# Patient Record
Sex: Female | Born: 1940
Health system: Southern US, Community
[De-identification: ages and names within clinical notes are randomized; demographics above are authoritative.]

## PROBLEM LIST (undated history)

## (undated) DIAGNOSIS — N179 Acute kidney failure, unspecified: Secondary | ICD-10-CM

## (undated) DIAGNOSIS — G4762 Sleep related leg cramps: Secondary | ICD-10-CM

## (undated) DIAGNOSIS — M48061 Spinal stenosis, lumbar region without neurogenic claudication: Secondary | ICD-10-CM

## (undated) DIAGNOSIS — E1140 Type 2 diabetes mellitus with diabetic neuropathy, unspecified: Secondary | ICD-10-CM

## (undated) DIAGNOSIS — K5792 Diverticulitis of intestine, part unspecified, without perforation or abscess without bleeding: Secondary | ICD-10-CM

## (undated) DIAGNOSIS — M48062 Spinal stenosis, lumbar region with neurogenic claudication: Secondary | ICD-10-CM

## (undated) DIAGNOSIS — M810 Age-related osteoporosis without current pathological fracture: Secondary | ICD-10-CM

## (undated) DIAGNOSIS — Z9889 Other specified postprocedural states: Secondary | ICD-10-CM

## (undated) DIAGNOSIS — D62 Acute posthemorrhagic anemia: Secondary | ICD-10-CM

## (undated) DIAGNOSIS — D573 Sickle-cell trait: Secondary | ICD-10-CM

## (undated) DIAGNOSIS — M170 Bilateral primary osteoarthritis of knee: Secondary | ICD-10-CM

## (undated) DIAGNOSIS — J189 Pneumonia, unspecified organism: Secondary | ICD-10-CM

## (undated) DIAGNOSIS — G253 Myoclonus: Secondary | ICD-10-CM

## (undated) DIAGNOSIS — I10 Essential (primary) hypertension: Secondary | ICD-10-CM

## (undated) DIAGNOSIS — E785 Hyperlipidemia, unspecified: Secondary | ICD-10-CM

## (undated) DIAGNOSIS — R0600 Dyspnea, unspecified: Secondary | ICD-10-CM

## (undated) DIAGNOSIS — R195 Other fecal abnormalities: Secondary | ICD-10-CM

## (undated) DIAGNOSIS — R011 Cardiac murmur, unspecified: Secondary | ICD-10-CM

## (undated) DIAGNOSIS — R413 Other amnesia: Secondary | ICD-10-CM

## (undated) DIAGNOSIS — K219 Gastro-esophageal reflux disease without esophagitis: Secondary | ICD-10-CM

## (undated) DIAGNOSIS — D649 Anemia, unspecified: Secondary | ICD-10-CM

## (undated) DIAGNOSIS — E1165 Type 2 diabetes mellitus with hyperglycemia: Secondary | ICD-10-CM

## (undated) DIAGNOSIS — T8859XA Other complications of anesthesia, initial encounter: Secondary | ICD-10-CM

## (undated) DIAGNOSIS — M4316 Spondylolisthesis, lumbar region: Secondary | ICD-10-CM

## (undated) DIAGNOSIS — F419 Anxiety disorder, unspecified: Secondary | ICD-10-CM

## (undated) DIAGNOSIS — M1990 Unspecified osteoarthritis, unspecified site: Secondary | ICD-10-CM

## (undated) DIAGNOSIS — G709 Myoneural disorder, unspecified: Secondary | ICD-10-CM

## (undated) DIAGNOSIS — M5416 Radiculopathy, lumbar region: Secondary | ICD-10-CM

## (undated) DIAGNOSIS — G4730 Sleep apnea, unspecified: Secondary | ICD-10-CM

## (undated) DIAGNOSIS — G9340 Encephalopathy, unspecified: Secondary | ICD-10-CM

## (undated) DIAGNOSIS — E039 Hypothyroidism, unspecified: Secondary | ICD-10-CM

## (undated) DIAGNOSIS — T4145XA Adverse effect of unspecified anesthetic, initial encounter: Secondary | ICD-10-CM

## (undated) DIAGNOSIS — M5126 Other intervertebral disc displacement, lumbar region: Secondary | ICD-10-CM

## (undated) DIAGNOSIS — M797 Fibromyalgia: Secondary | ICD-10-CM

## (undated) HISTORY — DX: Spinal stenosis, lumbar region with neurogenic claudication: M48.062

## (undated) HISTORY — PX: OVARIAN CYST SURGERY: SHX726

## (undated) HISTORY — DX: Encephalopathy, unspecified: G93.40

## (undated) HISTORY — DX: Spondylolisthesis, lumbar region: M43.16

## (undated) HISTORY — PX: COLONOSCOPY: SHX174

## (undated) HISTORY — DX: Myoclonus: G25.3

## (undated) HISTORY — DX: Type 2 diabetes mellitus with diabetic neuropathy, unspecified: E11.40

## (undated) HISTORY — PX: EYE SURGERY: SHX253

## (undated) HISTORY — PX: ABDOMINAL HYSTERECTOMY: SHX81

## (undated) HISTORY — DX: Other amnesia: R41.3

## (undated) HISTORY — DX: Sickle-cell trait: D57.3

## (undated) HISTORY — DX: Radiculopathy, lumbar region: M54.16

## (undated) HISTORY — DX: Other intervertebral disc displacement, lumbar region: M51.26

## (undated) HISTORY — DX: Spinal stenosis, lumbar region without neurogenic claudication: M48.061

## (undated) HISTORY — DX: Type 2 diabetes mellitus with hyperglycemia: E11.65

## (undated) HISTORY — DX: Other specified postprocedural states: Z98.890

## (undated) HISTORY — DX: Bilateral primary osteoarthritis of knee: M17.0

## (undated) HISTORY — DX: Acute kidney failure, unspecified: N17.9

## (undated) HISTORY — PX: BACK SURGERY: SHX140

## (undated) HISTORY — DX: Sleep related leg cramps: G47.62

## (undated) HISTORY — DX: Acute posthemorrhagic anemia: D62

## (undated) HISTORY — DX: Age-related osteoporosis without current pathological fracture: M81.0

## (undated) HISTORY — DX: Hyperlipidemia, unspecified: E78.5

## (undated) HISTORY — PX: OTHER SURGICAL HISTORY: SHX169

---

## 1998-10-20 ENCOUNTER — Ambulatory Visit (HOSPITAL_COMMUNITY): Admission: RE | Admit: 1998-10-20 | Discharge: 1998-10-20 | Payer: Self-pay | Admitting: Family Medicine

## 2000-12-26 ENCOUNTER — Other Ambulatory Visit: Admission: RE | Admit: 2000-12-26 | Discharge: 2000-12-26 | Payer: Self-pay | Admitting: Family Medicine

## 2002-03-24 ENCOUNTER — Ambulatory Visit (HOSPITAL_COMMUNITY): Admission: RE | Admit: 2002-03-24 | Discharge: 2002-03-24 | Payer: Self-pay | Admitting: Gastroenterology

## 2002-03-24 ENCOUNTER — Encounter (INDEPENDENT_AMBULATORY_CARE_PROVIDER_SITE_OTHER): Payer: Self-pay | Admitting: *Deleted

## 2003-05-12 ENCOUNTER — Encounter: Payer: Self-pay | Admitting: Internal Medicine

## 2003-08-04 ENCOUNTER — Encounter: Admission: RE | Admit: 2003-08-04 | Discharge: 2003-08-04 | Payer: Self-pay | Admitting: Internal Medicine

## 2004-03-07 ENCOUNTER — Ambulatory Visit: Payer: Self-pay | Admitting: Family Medicine

## 2004-04-11 ENCOUNTER — Ambulatory Visit: Payer: Self-pay | Admitting: Internal Medicine

## 2004-05-04 ENCOUNTER — Ambulatory Visit: Payer: Self-pay | Admitting: Internal Medicine

## 2004-06-13 ENCOUNTER — Other Ambulatory Visit: Admission: RE | Admit: 2004-06-13 | Discharge: 2004-06-13 | Payer: Self-pay | Admitting: Family Medicine

## 2005-04-21 ENCOUNTER — Emergency Department (HOSPITAL_COMMUNITY): Admission: EM | Admit: 2005-04-21 | Discharge: 2005-04-21 | Payer: Self-pay | Admitting: Emergency Medicine

## 2005-06-06 ENCOUNTER — Ambulatory Visit: Payer: Self-pay | Admitting: Internal Medicine

## 2005-07-03 ENCOUNTER — Emergency Department (HOSPITAL_COMMUNITY): Admission: EM | Admit: 2005-07-03 | Discharge: 2005-07-03 | Payer: Self-pay | Admitting: Emergency Medicine

## 2005-11-20 ENCOUNTER — Encounter: Admission: RE | Admit: 2005-11-20 | Discharge: 2005-11-20 | Payer: Self-pay | Admitting: Family Medicine

## 2007-05-25 ENCOUNTER — Encounter: Admission: RE | Admit: 2007-05-25 | Discharge: 2007-05-25 | Payer: Self-pay | Admitting: Orthopedic Surgery

## 2007-06-04 ENCOUNTER — Encounter: Admission: RE | Admit: 2007-06-04 | Discharge: 2007-06-04 | Payer: Self-pay | Admitting: Family Medicine

## 2007-06-19 ENCOUNTER — Encounter: Admission: RE | Admit: 2007-06-19 | Discharge: 2007-06-19 | Payer: Self-pay | Admitting: Family Medicine

## 2007-06-19 ENCOUNTER — Other Ambulatory Visit: Admission: RE | Admit: 2007-06-19 | Discharge: 2007-06-19 | Payer: Self-pay | Admitting: Interventional Radiology

## 2007-06-19 ENCOUNTER — Encounter (INDEPENDENT_AMBULATORY_CARE_PROVIDER_SITE_OTHER): Payer: Self-pay | Admitting: Interventional Radiology

## 2007-08-24 ENCOUNTER — Encounter: Admission: RE | Admit: 2007-08-24 | Discharge: 2007-08-24 | Payer: Self-pay | Admitting: Family Medicine

## 2008-01-06 ENCOUNTER — Emergency Department (HOSPITAL_COMMUNITY): Admission: EM | Admit: 2008-01-06 | Discharge: 2008-01-06 | Payer: Self-pay | Admitting: Emergency Medicine

## 2008-03-28 ENCOUNTER — Encounter: Admission: RE | Admit: 2008-03-28 | Discharge: 2008-04-18 | Payer: Self-pay | Admitting: Rheumatology

## 2009-07-25 ENCOUNTER — Ambulatory Visit (HOSPITAL_BASED_OUTPATIENT_CLINIC_OR_DEPARTMENT_OTHER): Admission: RE | Admit: 2009-07-25 | Discharge: 2009-07-25 | Payer: Self-pay | Admitting: Cardiology

## 2009-07-29 ENCOUNTER — Ambulatory Visit: Payer: Self-pay | Admitting: Internal Medicine

## 2010-05-13 ENCOUNTER — Encounter: Payer: Self-pay | Admitting: Orthopedic Surgery

## 2010-09-07 NOTE — Op Note (Signed)
   NAME:  [name], [name]                        ACCOUNT NO.:  [id number]   MEDICAL RECORD NO.:  [id number]                   PATIENT TYPE:  AMB   LOCATION:  ENDO                                 FACILITY:  [facility]   PHYSICIAN:  Anselmo Rod, M.D.               DATE OF BIRTH:  1941/01/21   DATE OF PROCEDURE:  DATE OF DISCHARGE:  03/24/2002                                 OPERATIVE REPORT   PROCEDURE PERFORMED:  Screening colonoscopy.   ENDOSCOPIST:  Charna Elizabeth, M.D.   INSTRUMENT USED:  Olympus video colonoscope.   INDICATIONS FOR PROCEDURE:  The patient is a 70 year old undergoing  screening colonoscopy.  Rule out colonic polyps, masses, etc.   PREPROCEDURE PREPARATION:  Informed consent was procured from the patient.  The patient was fasted for eight hours prior to the procedure and prepped  with a bottle of magnesium citrate and a gallon of NuLytely the night prior  to the procedure.   PREPROCEDURE PHYSICAL:  The patient had stable vital signs. Neck supple.  Chest clear to auscultation.  S1 and S2 regular.  Abdomen soft with normal  bowel sounds.   DESCRIPTION OF PROCEDURE:  The patient was placed in left lateral decubitus  position and sedated with 50 mg of Demerol and 5 mg of Versed intravenously.  Once the patient was adequately sedated and maintained on low flow oxygen  and continuous cardiac monitoring, the Olympus video colonoscope was  advanced from the rectum to the cecum without difficulty.  There was  evidence of left-sided diverticulosis. Small internal hemorrhoids were seen  on retroflexion.  Three small polyps were biopsied from 15 cm.  The patient  tolerated the procedure well without complications.  No large masses or  polyps were seen.   IMPRESSION:  1. Three small sessile polyps biopsied  at 15 cm.  2. Left-sided diverticulosis.  3. Normal-appearing transverse colon, right colon and cecum.   RECOMMENDATIONS:  1. Await pathology results.  2.     High  fiber diet with liberal fluid intake.  3. Repeat colorectal cancer screening in the next three to five years     depending on the pathology results.  4. Outpatient follow-up in the next two weeks.                                                   Anselmo Rod, M.D.    JNM/MEDQ  D:  03/25/2002  T:  03/26/2002  Job:  [id number]   cc:   [name], M.D.  [phone] [address]  [place]  [place] [address]  Fax: [phone]

## 2010-11-17 ENCOUNTER — Emergency Department (HOSPITAL_COMMUNITY): Payer: Medicare Other

## 2010-11-17 ENCOUNTER — Inpatient Hospital Stay (HOSPITAL_COMMUNITY)
Admission: EM | Admit: 2010-11-17 | Discharge: 2010-11-22 | DRG: 552 | Disposition: A | Discharge status: Home / Self Care | Payer: Medicare Other | Attending: Internal Medicine | Admitting: Internal Medicine

## 2010-11-17 DIAGNOSIS — E119 Type 2 diabetes mellitus without complications: Secondary | ICD-10-CM | POA: Diagnosis present

## 2010-11-17 DIAGNOSIS — I10 Essential (primary) hypertension: Secondary | ICD-10-CM | POA: Diagnosis present

## 2010-11-17 DIAGNOSIS — M545 Low back pain, unspecified: Secondary | ICD-10-CM

## 2010-11-17 DIAGNOSIS — E785 Hyperlipidemia, unspecified: Secondary | ICD-10-CM | POA: Diagnosis present

## 2010-11-17 DIAGNOSIS — M25559 Pain in unspecified hip: Secondary | ICD-10-CM | POA: Diagnosis present

## 2010-11-17 DIAGNOSIS — R197 Diarrhea, unspecified: Secondary | ICD-10-CM | POA: Diagnosis present

## 2010-11-17 DIAGNOSIS — M4310 Spondylolisthesis, site unspecified: Principal | ICD-10-CM | POA: Diagnosis present

## 2010-11-17 DIAGNOSIS — IMO0001 Reserved for inherently not codable concepts without codable children: Secondary | ICD-10-CM | POA: Diagnosis present

## 2010-11-17 DIAGNOSIS — M79609 Pain in unspecified limb: Secondary | ICD-10-CM | POA: Diagnosis present

## 2010-11-17 DIAGNOSIS — T383X5A Adverse effect of insulin and oral hypoglycemic [antidiabetic] drugs, initial encounter: Secondary | ICD-10-CM | POA: Diagnosis present

## 2010-11-18 ENCOUNTER — Observation Stay (HOSPITAL_COMMUNITY): Payer: Medicare Other

## 2010-11-18 LAB — COMPREHENSIVE METABOLIC PANEL
AST: 16 U/L (ref 0–37)
Albumin: 3.7 g/dL (ref 3.5–5.2)
Alkaline Phosphatase: 44 U/L (ref 39–117)
BUN: 18 mg/dL (ref 6–23)
Potassium: 3.8 mEq/L (ref 3.5–5.1)
Sodium: 142 mEq/L (ref 135–145)
Total Protein: 6.5 g/dL (ref 6.0–8.3)

## 2010-11-18 LAB — CK: Total CK: 70 U/L (ref 7–177)

## 2010-11-18 LAB — CBC
MCH: 27.5 pg (ref 26.0–34.0)
MCHC: 33.5 g/dL (ref 30.0–36.0)
RBC: 4.43 MIL/uL (ref 3.87–5.11)
RDW: 13.4 % (ref 11.5–15.5)
WBC: 7.1 10*3/uL (ref 4.0–10.5)

## 2010-11-18 LAB — DIFFERENTIAL
Eosinophils Relative: 3 % (ref 0–5)
Lymphocytes Relative: 35 % (ref 12–46)
Lymphs Abs: 2.5 10*3/uL (ref 0.7–4.0)
Monocytes Absolute: 0.7 10*3/uL (ref 0.1–1.0)
Neutro Abs: 3.7 10*3/uL (ref 1.7–7.7)

## 2010-11-18 LAB — GLUCOSE, CAPILLARY
Glucose-Capillary: 154 mg/dL — ABNORMAL HIGH (ref 70–99)
Glucose-Capillary: 173 mg/dL — ABNORMAL HIGH (ref 70–99)
Glucose-Capillary: 237 mg/dL — ABNORMAL HIGH (ref 70–99)

## 2010-11-18 NOTE — H&P (Signed)
[name], [name]              ACCOUNT NO.:  [id number]  MEDICAL RECORD NO.:  [id number]  LOCATION:  [facility]                         FACILITY:  [facility]  PHYSICIAN:  [name], MD        DATE OF BIRTH:  26-May-1940  DATE OF ADMISSION:  11/17/2010 DATE OF DISCHARGE:                             HISTORY & PHYSICAL PCP is Dr. [name].  CHIEF COMPLAINT:  Severe lower extremity pain and hip pain.  HISTORY OF PRESENT ILLNESS:  This is a pleasant 70 year old African American female who presents to [facility] Emergency Room for abdominal pain because she could not get up this morning.  She states yesterday this started and she had trouble with her legs and could not get up.  She called her daughter and went to Dr. [name] office and was seen by the PA there and diagnosed hip bursitis and had bilateral hip injections with prednisone/steroids and was then sent home with instructions not to sit in one place.  The patient ignored this advice and did on and off computer work, seated but taking breaks and this morning woke up after taking half hydrocodone last night and could not get up again.  Called her daughter and was brought over to the emergency room by her daughter for further evaluation.  She was told by the PA that she may require MRI of the hip and had an x-ray of the lumbar spine in that office prior.  The patient also states that she has had chronic diarrhea for about 8 months and unsure why she has this.  She is worried that she has had MS as her mother had the same thing and she really could not think because of this.  She states that she has been more careful than usual.  REVIEW OF SYSTEMS:  The patient states that she has pain in her back going down thighs, behind her knee, it stops at the back of the knee, it is sort of an electric shock like sensation down her back down to her knees.  She also has severe lancinating pain.  She denies any dysuria, any chest pain,  shortness breath, any stroke.  Stated that she has lost eyesight recently and diagnosed with wet macular degeneration.  At baseline, the patient can walk 3-4 blocks, but has noticed in the recent past over the past 2-3 months that when she walks she gets tired and does develop discomfort in her back.  This has been going on maybe for about 2-6 months.  PAST MEDICAL HISTORY:  She is diabetic.  She has hypertension, cholesterol.  Has been diagnosed with fibromyalgia and sees Dr. [name] for the same.  The patient retired in December.  MEDICATIONS: 1. Glumetza 1000 mg. 2. Zetia 10 mg. 3. Glimepiride 1 tab daily. 4. Lescol 80 mg daily. 5. Diovan 320 mg daily. 6. Zolpidem 10 mg daily. 7. Potassium chloride 20 mEq daily. 8. Onglyza 5 mg daily. 9. Alendronate 70 mg daily. 10.Hydrocodone/APAP 7.5/500 daily. 11.Ganciclovir 500 daily. 12.Gabapentin 100 mg 3 times at night. 13.Calcium 600 daily. Numerous Eye drops  ALLERGIES:  She is allergic to Dristan Sinus.  The patient denies any loss of control  bowel or bladder or saddle-like anesthesia.  PHYSICAL EXAMINATION:  VITAL SIGNS:  Temperature 98.9, blood pressure 103/69, respirations 19, pain is noted as severe, O2 sat 97%. GENERAL:  The patient is alert, oriented, African American female in significant distress. HEENT:  When she sits up, I cannot appreciate fundi.  She has good dentition.  She has no pallor.  She has no icterus. NECK:  She has no JVD.  She has no carotid bruits. HEART:  S1 and S2.  No murmurs, rubs, or gallops.  I do not appreciate any murmurs, rubs, or gallops. ABDOMEN:  Soft, nontender, and nondistended. EXTREMITIES:  Straight leg raising test cause some discomfort on the left side.  Axial rotation of the hip while lying in bed and standing up caused significant discomfort and pain, causes severe lancinating pain down both hips.  Reflexes are 2 and 3 bilaterally.  Ankle reflexes 2 and 3 bilaterally.   Brachioradialis, biceps, and triceps are 2/3.  Power is preserved in lower extremities and weaker than upper extremities.  Her dorsiflexion is weaker than the plantarflexion.  LABORATORY DATA:  Hip x-ray on November 17, 2010, negative exam for fracture or dislocation.  No focal deficits.  No acute findings on the right side.  Mild degenerative joint disease identified.  Lumbar spinal x-ray showed L5-S1 degenerative disk disease, first-degree anterolisthesis at L5-S1, no acute findings identified.  IMPRESSION/RECOMMENDATIONS: 1. Unlikely neurogenic claudication secondary to possible spinal     stenosis.  On axial rotation, she has pretty severe pain with     lancinating symptoms down the back.  We will get an MRI of the back     as well as the hips to rule out any spinal stenosis versus nerve     impingements.  I have explained to the patient that there may be 2     modalities to her pain.  She may have some trochanteric bursitis as     well as she has had hip shots in the recent past.  However, we will     see how her pain is controlled over the course of her time here     and if we can, we will either have Neurosurgery or Orthopedic     consult to determine the best course of action versus past     interventional period.  Certainly, she would benefit from minimal     invasive epidural surgery by interventional radiologist if     possible. 2. Severe pain.  We will place her on IV Dilaudid and also keep her on     diazepam while here. 3. Hypertension.  She has borderline low blood pressures.  I will     reduce her Diovan to 160 mg and review her blood pressure. 4. Diabetes mellitus.  I will hold her metformin as well as other oral     anti-glycemics and keep her on sliding scale insulin with no long-     term coverage.  She seems active and her Dm is well controlled and might not require much. 5. The patient will be admitted as an outpatient until we get an MRI     and then we will  determine ultimate course of treatment. 6. Diarrhoea-she has been having this for about 8 months and I doubt this is infectious. She thinks this started aroudn the time that she was started back on Metformin--I will hold her Metformin and see if this resolves. 7. ? of MS-he mother has this-patient is very concerend  re: likely having this.  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   I spent over 30 minutes admitting this patient.          ______________________________ [name], MD     JS/MEDQ  D:  11/17/2010  T:  11/17/2010  Job:  [id number]  Electronically Signed by [name] MD on 11/18/2010 12:39:48 PM

## 2010-11-19 ENCOUNTER — Observation Stay (HOSPITAL_COMMUNITY): Payer: Medicare Other

## 2010-11-19 LAB — GLUCOSE, CAPILLARY: Glucose-Capillary: 135 mg/dL — ABNORMAL HIGH (ref 70–99)

## 2010-11-20 LAB — GLUCOSE, CAPILLARY
Glucose-Capillary: 210 mg/dL — ABNORMAL HIGH (ref 70–99)
Glucose-Capillary: 129 mg/dL — ABNORMAL HIGH (ref 70–99)
Glucose-Capillary: 153 mg/dL — ABNORMAL HIGH (ref 70–99)

## 2010-11-20 LAB — APTT: aPTT: 26 seconds (ref 24–37)

## 2010-11-20 NOTE — Discharge Summary (Signed)
[name], [name]              ACCOUNT NO.:  [id number]  MEDICAL RECORD NO.:  [id number]  LOCATION:  3013                         FACILITY:  [facility]  PHYSICIAN:  [name], MD        DATE OF BIRTH:  Oct 03, 1940  DATE OF ADMISSION:  11/17/2010 DATE OF DISCHARGE:                              DISCHARGE SUMMARY   DISCHARGE DIAGNOSES: 1. Severe bilateral arthritis of L5-S1 and bilateral lateral recess     stenosis impinging of S1 nerve root resulting in difficulty     ambulating. 2. Hypertension. 3. Diabetes mellitus. 4. Hyperlipidemia. 5. Diarrhea likely secondary to metformin.  DISCHARGE MEDICATIONS:  Diovan HCT 320/12.5 one daily, gabapentin 100 two capsules t.i.d., Ambien 5 mg daily, and alendronate 1 tablet daily 70 mg.  I have discontinued her Glumetza.  I have discontinued her Onglyza.  Lescol XL 80 mg daily.  Her insulin will be adjusted by discharging physician and may include 70/30 insulin.  Famciclovir 500 b.i.d., hydrocodone/APAP as per prior home doses, calcium carbonate 1 tablet daily.  I have held her potassium chloride and I have discontinued Amaryl.  The patient will continue her B12 and vitamin B6 one tablet daily each.  IMAGING STUDIES:  X-ray hips and L-spine which were negative.  L-spine x- ray showed L5-S1 degenerative disk disease with first-degree anterolisthesis of L5 and S1.  MRI hip November 20, 2010, left L-spine showed severe bilateral arthritis at L5-S1, bilateral lateral recess stenosis impinging on S1 nerve root.  Grade I spondylolisthesis of L5 and S1 with small broad-based disk protrusion.  Moderately severe bilateral facet arthritis L4-5.  Hip x-ray is negative.  Hip MRI is negative.  HISTORY OF PRESENT ILLNESS:  This is a pleasant 70 year old female seen at Dr. [name] office last Friday for possible hip bursitis and was given steroid shots and had been sent home.  Instructed not to do any computer work, but persisted on doing the  same.  Woke up on the morning of admission and was not able to stand.  This has happened prior and she called the daughter and came over to the emergency room.  She also has had chronic diarrhea for 8 months, unsure why this.  As her mother had MS, she thinks this is possible as she also had some difficulty with vision occasionally.  PERTINENT POSITIVES ON ADMISSION:  She was an alert and oriented African American female.  I cannot really appreciate fundi.  Straight-leg raise causes discomfort on the left side.  Axial rotation of the hip on the bed caused significant discomfort and pain causing severe lancinating pain on both hips.  Reflexes 2/3 bilaterally.  Ankle reflexes 2/3 bilaterally.  Brachioradialis, triceps, and biceps 2/3.  Power is preserved in the lower extremity, and we can upper her dorsiflexion, recently plantar.  HOSPITAL COURSE: 1. Severe nerve impingement L5 through S1 bilaterally.  I did consult     Dr. Danielle Dess after the above MRI was done.  He recommend vascular     intervention to trial epidural steroid injection.  He kindly called     me with regard to the same, and ultimately, the patient will have     epidural  steroid injection with prednisone.  The risks and benefits     have been obtained and explained.  I anticipate her blood sugar     will jump again.  In anticipation of this, we will keep her on the     current dose of insulin hopefully until tomorrow.  If her blood     sugar is under moderate control, she likely will be able to be     discharged tomorrow if she is pain free and ambulatory.  I have     asked Physical Therapy and Occupational Therapy to teach her good     body mechanics as well as back strengthening exercises as that is     the long-term plan.  The patient can follow up with Dr. Danielle Dess in     the long term if need be. 2. Hypertension.  She has borderline blood pressures.  I would     recommend either halving her Diovan/HCTZ and review this.   Her     blood pressures today are relatively stable in the 123-126 range. 3. Diabetes mellitus.  I have taken the liberty of discontinuing all     of her oral medications in anticipation of her going home.  She has     learned to use insulin, and I did not obtain a HbA1c, but certainly     that can be obtained as an outpatient with Dr. Lucianne Muss.  The     discharging physician will determine the dose of the same. 4. Unlikely history of multiple sclerosis, the patient feels reassured     that this is not the case.  The patient was seen today.  Vitals were stable.  She was pleasantly alert, oriented, sitting up in bed in a lot less discomfort and actually able to ambulate with the help of a walker.  Temperature 98.1, pulse 73, respirations 17, blood pressure 123-126/79-77.  Chest clinically clear. S1 and S2.  No murmurs, rubs, or gallops.  Abdomen soft, nontender, and nondistended.  Still some mild persisting weakness; however, much improved.  The patient will be seen for procedure and it will be the discharging physician's discretion to discharge her home.          ______________________________ [name], MD     JS/MEDQ  D:  11/20/2010  T:  11/20/2010  Job:  [id number]  cc:   [name], M.D. Reather Littler, M.D.  Electronically Signed by [name] MD on 11/20/2010 10:49:19 PM

## 2010-11-21 ENCOUNTER — Observation Stay (HOSPITAL_COMMUNITY): Payer: Medicare Other

## 2010-11-21 LAB — GLUCOSE, CAPILLARY
Glucose-Capillary: 232 mg/dL — ABNORMAL HIGH (ref 70–99)
Glucose-Capillary: 298 mg/dL — ABNORMAL HIGH (ref 70–99)

## 2010-11-21 MED ORDER — IOHEXOL 180 MG/ML  SOLN
50.0000 mL | Freq: Once | INTRAMUSCULAR | Status: AC | PRN
  Administered 2010-11-21: 5 mL

## 2010-11-22 LAB — GLUCOSE, CAPILLARY
Glucose-Capillary: 211 mg/dL — ABNORMAL HIGH (ref 70–99)
Glucose-Capillary: 315 mg/dL — ABNORMAL HIGH (ref 70–99)

## 2010-11-22 NOTE — Discharge Summary (Signed)
[name], [name] NO.:  [id number]  MEDICAL RECORD NO.:  [id number]  LOCATION:  3013                         FACILITY:  [facility]  PHYSICIAN:  Zannie Cove, MD     DATE OF BIRTH:  03-Dec-1940  DATE OF ADMISSION:  11/17/2010 DATE OF DISCHARGE:                              DISCHARGE SUMMARY   ADDENDUM  PRIMARY CARE PHYSICIAN:  [name], MD  ENDOCRINOLOGIST:  Reather Littler, MD  DISCHARGE DIAGNOSES: 1. Severe L5-S1 facet arthritis, spondylolisthesis, bilateral recess stenosis status post     epidural spinal injection of steroids. 2. Type 2 diabetes. 3. Hypertension. 4. Dyslipidemia. 5. Diarrhea secondary to metformin.  Discharge medications are as follows: 1. Flexeril 5 mg p.o. q.6 h. p.r.n. 2. Hydrocodone/APAP 7.5/500 one tablet q.6 h. p.r.n. for pain. 3. Glimepiride 2 mg tablets increased to 2 tablets p.o. b.i.d. 4. Alendronate 70 mg daily. 5. Ambien 10 mg 1/2 tablet p.o. daily. 6. Calcium carbonate with vitamin D 1 tablet daily. 7. Diovan HCT 320/12.5 one tablet daily. 8. Famciclovir 500 mg p.o. b.i.d. 9. Gabapentin 100 mg 2 capsules t.i.d. 10.Lescol 80 mg daily. 11.Onglyza 5 mg p.o. daily. 12.Potassium chloride 20 mEq daily. 13.Vitamin B12 one tablet daily. 14.Vitamin B6 one tablet daily. 15.Zetia 10 mg daily.  CONSULTANTS: 1. Stefani Dama, MD with Neurosurgery. 2. Marin Roberts, MD with Interventional Radiology.  HOSPITAL COURSE:  [name] is a 70 year old African American female with history of intermittent low back pain as well as fibromyalgia for years, presented to the hospital with worsening back and hip pain radiating down to her legs causing difficulty in ambulation and mobilization.  As a part of her workup, she underwent an MRI of her LS spine that showed severe bilateral facet arthritis at L5-S1 with bilateral recess stenosis and spondylolisthesis. 1. For her severe bilateral facet arthritis at L5-S1 with  spondylolisthesis and lateral recess stenosis impinging on the S1     nerve roots, she underwent epidural spinal injection with 80 mL of     Depo-Medrol on November 21, 2010, per Marin Roberts of     Interventional Radiology.  She was able to tolerate the procedure     well and had much clinical response in terms of her pain.  In     addition, pain was also controlled with current regimen of     hydrocodone and Flexeril.  She was also evaluated by Physical     Therapy who did not feel that she had any ongoing further physical     therapy needs. 2. Type 2 diabetes.  Her metformin was discontinued due to persistent     ongoing chronic diarrhea, the diarrhea stopped after her metformin     has been discontinued, and her glimepiride dose has been doubled.     Her sugars over the next day or two will likley be elevated due to the Steroid shot,      hopefully after that will stabilize, she has been advised to be well hydrated during this time.     Zannie Cove, MD     PJ/MEDQ  D:  11/22/2010  T:  11/22/2010  Job:  [id number]  Electronically Signed by Joya Martyr  Matson Welch  on 11/22/2010 05:35:04 PM

## 2010-11-24 ENCOUNTER — Other Ambulatory Visit (HOSPITAL_BASED_OUTPATIENT_CLINIC_OR_DEPARTMENT_OTHER): Payer: Medicare Other

## 2010-11-26 NOTE — Consult Note (Signed)
[name], WEDEL NO.:  [id number]  MEDICAL RECORD NO.:  [id number]  LOCATION:  3013                         FACILITY:  [facility]  PHYSICIAN:  Stefani Dama, M.D.  DATE OF BIRTH:  August 08, 1940  DATE OF CONSULTATION:  11/20/2010 DATE OF DISCHARGE:                                CONSULTATION   REFERRING PROVIDER:  [facility], Dr. Gabriel Rung.  REASON FOR REQUEST:  Spondylolisthesis and lumbar radiculopathy, back pain, leg pain.  HISTORY OF PRESENT ILLNESS:  Ms. [name] is a 70 year old black female who has had some episodes of intermittent back pain for years in the past.  She has a known history of fibromyalgia and has been treated by Dr. [name] who notes that she has maintained a very positive attitude and overcome much of her back pain and hip pain with activity.  However, this past Friday, hip pain was becoming much more severe and after being seen in Dr. Fatima Sanger office, she underwent some trochanteric injection.  The following day, she notes that she could hardly move or walk at all, was ultimately hospitalized at that time and she has been undergoing aggressive workup.  This included an MRI of the lumbar spine.  I have been asked to evaluate this study and to discuss options for treatment with the individual.  The patient notes that she has had some easing in the severity of her back pain and hip pain, things do seem to be improving slightly, but she still has excessive pain in the lower extremities, particularly when she bears weight.  She has been trying get up and about just a little bit on her own is very difficult.  She has not had a Physical Therapy evaluation. She describes the pain radiating from the gluteal region down the back of both lower extremities.  Overall, she feels that the back is fairly stable.  Most of the pain is in the buttocks and leg.  PAST MEDICAL HISTORY:  Notable for history of diabetes.  She has recently  been started on some insulin to help control her sugars and her medications are currently being adjusted and there had been a long-time assume that she had diabetic neuropathy.  On examination today, I note that her motor strength is good in iliopsoas, quadriceps, tibialis anterior, and the gastrocs.  Reflexes are absent in the patellae and the Achilles both.  Babinski's are downgoing.  Sensation appears intact to pin and light touch in both distal lower extremities.  The patient is able to stand with some difficulty, complaining mostly of lower extremity pain.  Her back is nontender to palpation.  IMPRESSION:  Review of the patient's MRI demonstrates that she has a spondylolisthesis grade 1 with lateral recess stenosis, particularly at L3-L5 nerve roots as they enter the foramen.  She also has some mild- moderate spondylosis at L4-L5 and spondylolisthesis.  There is a mild lateral recess stenosis there.  I discussed the situation with the patient and noted that I believe that she would be a candidate for translaminar epidural steroid injection in the lower lumbar spine.  This would hopefully help to calm this process which is likely aggravating pain into her lower  extremities.  Because she is a recent diabetic with some glucose control problems, I discussed this with the [facility] and they feel it would be reasonable that she undergo an epidural steroid injection.  I suggested that we pursue this with Neuro intervention at the hospital.  This will likely be arranged for the next day or so.  I discussed the full course of action with this spondylolisthesis.  I indicated that this has more likely been longstanding process for lumbar spine.  She has not been aware that she had had this condition up until this point.  I indicated that she should respond to the epidural steroids and hopefully this may make this process somewhat better.  If it does not work or the pain  process continues to recur or weakness becomes a chronic issue, she may need to consider surgical decompression and stabilization of the lower lumbar spine.  For the current time being that this has not been treated and spondylolisthesis per se, I believe that she should hopefully respond well to conservative measures.  We will follow along.     Stefani Dama, M.D.     [name]  D:  11/21/2010  T:  11/21/2010  Job:  [id number]  Electronically Signed by Barnett Abu M.D. on 11/26/2010 07:32:41 AM

## 2010-11-27 NOTE — Discharge Summary (Signed)
  [name], [name] NO.:  [id number]  MEDICAL RECORD NO.:  [id number]  LOCATION:  3013                         FACILITY:  [facility]  PHYSICIAN:  Zannie Cove, MD     DATE OF BIRTH:  [birth year]  DATE OF ADMISSION:  11/17/2010 DATE OF DISCHARGE:                              DISCHARGE SUMMARY   PRIMARY PHYSICIAN:  [name], MD  ENDOCRINOLOGIST:  Reather Littler, MD  DISCHARGE DIAGNOSIS:  As dictated above.  DISCHARGE MEDICATIONS:  Changed to: 1. Flexeril 5 mg p.o. q.6 h. p.r.n. 2. Glimepiride increased to 2 mg tablets, 2 tablets p.o. b.i.d. 3. Alendronate 70 mg. 4. Ambien 10 mg 1/2 tablet daily. 5. Calcium carbonate, vitamin D 1 tablet daily. 6. Diovan HCT 320/12.5, 1 tablet daily. 7. Famciclovir 500 mg p.o. b.i.d. 8. Gabapentin 100 mg 2 capsules p.o. t.i.d. 9. Hydrocodone/APAP 7.5/500, 1 tab p.o. q.6 h. P.r.n. 10.Lescol XL 80 mg daily. 11.Onglyza (saxagliptin) 5 mg daily. 12.Potassium chloride 20 mEq daily. 13.Vitamin B12, 1 tablet daily. 14.Vitamin B6, 1 tablet daily. 15.Zetia 10 mg daily.  IMAGING/HOSPITAL COURSE: 1. As dictated by Dr. Mahala Menghini.  In addition, the patient had epidural     spinal injection done by Dr. Gennette Pac of Interventional     Radiology with steroids with 80 mL of Depo-Medrol on November 21, 2010.  She tolerated the procedure well with moderate clinical     response.  In addition, they have also been seen by Physical     Therapy, did not feel that she had any ongoing physical therapy     needs at this time and hence will be discharged home to continue     her hydrocodone p.r.n. with Flexeril. 2. In terms of her diabetes, she had been put on insulin while in the     hospital but this was discontinued at the time of discharge and her     saxagliptin as well glimepiride were continued.  Her glimepiride     dose was increased from 2 mg b.i.d. to 4 mg b.i.d. and her     metformin was discontinued due to chronic ongoing diarrhea.   We     will have her follow up with Dr. Lucianne Muss to determine future ongoing     care and treatment of her diabetes. 3. Rest of her chronic medical problems remained stable.  DISCHARGE CONDITION:  Stable.  DISCHARGE FOLLOWUP: 1. Dr. [name] in 7-10 days. 2. Dr. Reather Littler in 2 weeks.     Zannie Cove, MD     PJ/MEDQ  D:  11/22/2010  T:  11/22/2010  Job:  [id number]  cc:   Reather Littler, M.D. [name], M.D.  Electronically Signed by Zannie Cove  on 11/27/2010 05:23:07 PM

## 2010-12-30 ENCOUNTER — Emergency Department (HOSPITAL_COMMUNITY)
Admission: EM | Admit: 2010-12-30 | Discharge: 2010-12-30 | Disposition: A | Discharge status: Home / Self Care | Payer: Medicare Other | Source: Home / Self Care | Attending: Emergency Medicine | Admitting: Emergency Medicine

## 2010-12-30 DIAGNOSIS — R269 Unspecified abnormalities of gait and mobility: Secondary | ICD-10-CM | POA: Insufficient documentation

## 2010-12-30 DIAGNOSIS — E119 Type 2 diabetes mellitus without complications: Secondary | ICD-10-CM | POA: Insufficient documentation

## 2010-12-30 DIAGNOSIS — I10 Essential (primary) hypertension: Secondary | ICD-10-CM | POA: Insufficient documentation

## 2010-12-30 DIAGNOSIS — M6281 Muscle weakness (generalized): Secondary | ICD-10-CM | POA: Insufficient documentation

## 2010-12-30 DIAGNOSIS — Z794 Long term (current) use of insulin: Secondary | ICD-10-CM | POA: Insufficient documentation

## 2010-12-30 DIAGNOSIS — M549 Dorsalgia, unspecified: Secondary | ICD-10-CM | POA: Insufficient documentation

## 2011-01-02 ENCOUNTER — Ambulatory Visit (HOSPITAL_COMMUNITY): Payer: Medicare Other

## 2011-01-02 ENCOUNTER — Inpatient Hospital Stay (HOSPITAL_COMMUNITY)
Admission: RE | Admit: 2011-01-02 | Discharge: 2011-01-04 | DRG: 491 | Disposition: A | Discharge status: Home Health | Payer: Medicare Other | Source: Ambulatory Visit | Attending: Neurological Surgery | Admitting: Neurological Surgery

## 2011-01-02 DIAGNOSIS — I10 Essential (primary) hypertension: Secondary | ICD-10-CM | POA: Diagnosis present

## 2011-01-02 DIAGNOSIS — M47817 Spondylosis without myelopathy or radiculopathy, lumbosacral region: Principal | ICD-10-CM | POA: Diagnosis present

## 2011-01-02 DIAGNOSIS — Q762 Congenital spondylolisthesis: Secondary | ICD-10-CM

## 2011-01-02 DIAGNOSIS — E119 Type 2 diabetes mellitus without complications: Secondary | ICD-10-CM | POA: Diagnosis present

## 2011-01-02 DIAGNOSIS — Z794 Long term (current) use of insulin: Secondary | ICD-10-CM

## 2011-01-02 DIAGNOSIS — Z79899 Other long term (current) drug therapy: Secondary | ICD-10-CM

## 2011-01-02 DIAGNOSIS — Z87891 Personal history of nicotine dependence: Secondary | ICD-10-CM

## 2011-01-02 LAB — BASIC METABOLIC PANEL
CO2: 30 mEq/L (ref 19–32)
Calcium: 10.4 mg/dL (ref 8.4–10.5)
Creatinine, Ser: 0.98 mg/dL (ref 0.50–1.10)
GFR calc non Af Amer: 56 mL/min — ABNORMAL LOW (ref >60)
Sodium: 140 mEq/L (ref 135–145)

## 2011-01-02 LAB — CBC
MCH: 28.3 pg (ref 26.0–34.0)
MCHC: 33.9 g/dL (ref 30.0–36.0)
MCV: 83.4 fL (ref 78.0–100.0)
Platelets: 249 10*3/uL (ref 150–400)
RBC: 4.77 MIL/uL (ref 3.87–5.11)
RDW: 15 % (ref 11.5–15.5)

## 2011-01-02 LAB — SURGICAL PCR SCREEN: MRSA, PCR: NEGATIVE

## 2011-01-03 LAB — GLUCOSE, CAPILLARY
Glucose-Capillary: 192 mg/dL — ABNORMAL HIGH (ref 70–99)
Glucose-Capillary: 209 mg/dL — ABNORMAL HIGH (ref 70–99)
Glucose-Capillary: 118 mg/dL — ABNORMAL HIGH (ref 70–99)
Glucose-Capillary: 231 mg/dL — ABNORMAL HIGH (ref 70–99)

## 2011-02-20 NOTE — Op Note (Signed)
[name], [name] NO.:  [id number]  MEDICAL RECORD NO.:  [id number]  LOCATION:  3005                         FACILITY:  [facility]  PHYSICIAN:  Stefani Dama, M.D.  DATE OF BIRTH:  November 02, 1940  DATE OF PROCEDURE:  01/02/2011 DATE OF DISCHARGE:                              OPERATIVE REPORT   PREOPERATIVE DIAGNOSES:  Lumbar spondylosis, spondylolisthesis at L5-S1, bilateral lumbar radiculopathy, S1 distribution.  POSTOPERATIVE DIAGNOSIS:  Lumbar spondylosis, spondylolisthesis at L5- S1, bilateral lumbar radiculopathy, S1 distribution.  PROCEDURE:  Bilateral laminotomies and foraminotomies, decompression of L5 and S1 nerve roots with operating microscope, microdissection technique.  SURGEON:  Stefani Dama, MD  FIRST ASSISTANT:  Hilda Lias, MD  ANESTHESIA:  General endotracheal.  INDICATIONS:  [name] is a 70 year old individual who has had significant back and right greater than left lower extremity pain.  She has a spondylolisthesis that was diagnosis of an MRI back in July when she was hospitalized for severe back pain.  At that time, she underwent a transforaminal injection, which did seem to help significantly and lasts for several weeks, but then pain recurred.  Had seen her in the office about a week and half ago and advised options regarding conservative care and consideration of surgical intervention.  She has had progressively worsening pain despite efforts at conservative management and was taken to the operating room now to undergo bilateral laminotomies and decompression at the L5-S1 level.  PROCEDURE:  The patient was brought to the operating room, supine on the stretcher.  After the smooth induction of general endotracheal anesthesia, she was turned prone.  The back was prepped with alcohol and DuraPrep, draped in a sterile fashion.  The L5-S1 was located first with a needle localization and a radiograph and a small vertical  incision was created over this region.  This was carried down to the lumbodorsal fascia, which was opened on either side of midline.  Self-retaining retractor was placed into the wound exposing the interlaminar space at L5-S1, and markedly hypertrophied facet joints, then laminotomy was created first on the right side where the patient's pain was worse and this was taken out to the medial wall of the facet and partial medial facetectomy removing about a third of the medial border of the facet was performed.  Yellow ligament was taken up and then in the superior aspect, there was noted to be a synovial cyst with some hemorrhagic grumous material within it.  This was resected and allowed for good decompression of the angle of the takeoff for the L5 nerve root superiorly and the S1 nerve root inferiorly.  The mass was big enough to be causing pressure on the dural tube and takeoff of the S1 nerve root superiorly even on the L5 nerve root.  Once this mass was resected in a piecemeal fashion, it was obvious that the L5 and the S1 nerve roots were much freer.  The disk space here was bulging posteriorly; however, there was no obvious disk herniation and no rents or tears in the ligament itself and it remained intact.  A laminotomy was then created on the left side in a similar fashion.  Partial medial facetectomy was  performed.  Here, there was redundant and thickened yellow ligament, but no frank synovial cyst.  This also indented the common dural tube at the takeoff of the S1 nerve root in the junction of the L5 nerve root.  This area was well decompressed and the foramen for the L5 nerve root easily be sounded after the decompression was completed as could the path of the S1 nerve root.  The disk also hear was noted to be bulging, but intact at L5-S1, and it was disrupted in any way, but this hemostasis in the soft tissues was obtained meticulously, the lumbodorsal fascia was closed with #1  Vicryl interrupted fashion, 2-0 Vicryl using subcutaneous tissues, and 3-0 Vicryl subcuticularly.  Dermabond was placed on the skin.  Blood loss for the procedure was estimated less than 75 mL.     Stefani Dama, M.D.     [name]  D:  01/02/2011  T:  01/03/2011  Job:  [id number]  Electronically Signed by Barnett Abu M.D. on 02/20/2011 06:52:54 AM

## 2011-03-12 ENCOUNTER — Encounter (HOSPITAL_COMMUNITY): Payer: Self-pay | Admitting: Pharmacy Technician

## 2011-03-12 ENCOUNTER — Other Ambulatory Visit: Payer: Self-pay | Admitting: Neurological Surgery

## 2011-03-15 ENCOUNTER — Encounter (HOSPITAL_COMMUNITY): Payer: Self-pay

## 2011-03-15 ENCOUNTER — Encounter (HOSPITAL_COMMUNITY)
Admission: RE | Admit: 2011-03-15 | Discharge: 2011-03-15 | Disposition: A | Discharge status: Home / Self Care | Payer: Medicare Other | Source: Ambulatory Visit | Attending: Neurological Surgery | Admitting: Neurological Surgery

## 2011-03-15 ENCOUNTER — Other Ambulatory Visit: Payer: Self-pay

## 2011-03-15 HISTORY — DX: Cardiac murmur, unspecified: R01.1

## 2011-03-15 HISTORY — DX: Sleep apnea, unspecified: G47.30

## 2011-03-15 HISTORY — DX: Unspecified osteoarthritis, unspecified site: M19.90

## 2011-03-15 HISTORY — DX: Other complications of anesthesia, initial encounter: T88.59XA

## 2011-03-15 HISTORY — DX: Adverse effect of unspecified anesthetic, initial encounter: T41.45XA

## 2011-03-15 HISTORY — DX: Fibromyalgia: M79.7

## 2011-03-15 HISTORY — DX: Myoneural disorder, unspecified: G70.9

## 2011-03-15 HISTORY — DX: Essential (primary) hypertension: I10

## 2011-03-15 HISTORY — DX: Gastro-esophageal reflux disease without esophagitis: K21.9

## 2011-03-15 LAB — BASIC METABOLIC PANEL
BUN: 21 mg/dL (ref 6–23)
CO2: 34 mEq/L — ABNORMAL HIGH (ref 19–32)
Chloride: 104 mEq/L (ref 96–112)
GFR calc Af Amer: 69 mL/min — ABNORMAL LOW (ref >90)
Potassium: 3.7 mEq/L (ref 3.5–5.1)

## 2011-03-15 LAB — TYPE AND SCREEN: Antibody Screen: NEGATIVE

## 2011-03-15 LAB — SURGICAL PCR SCREEN: MRSA, PCR: NEGATIVE

## 2011-03-15 LAB — CBC
HCT: 37.5 % (ref 36.0–46.0)
Hemoglobin: 12.4 g/dL (ref 12.0–15.0)
MCHC: 33.1 g/dL (ref 30.0–36.0)
MCV: 84.1 fL (ref 78.0–100.0)
WBC: 7.2 10*3/uL (ref 4.0–10.5)

## 2011-03-15 LAB — ABO/RH: ABO/RH(D): B POS

## 2011-03-15 MED ORDER — CEFAZOLIN SODIUM 1-5 GM-% IV SOLN: 1.0000 g | INTRAVENOUS | Status: DC

## 2011-03-15 NOTE — Progress Notes (Signed)
CXR on chart, will call Dr. Shana Chute on 03/18/2011 for last ov note, will call Dr. Parke Simmers on same day for last stress test, done 02/2010, per pt.

## 2011-03-15 NOTE — Pre-Procedure Instructions (Signed)
20 [name]  03/15/2011   Your procedure is scheduled on:  03/19/2011, Tuesday  Report to [facility] at 10:00 AM.  Call this number if you have problems the morning of surgery: [phone]   Remember:   Do not eat food:After Midnight.  Do not drink clear liquids: 4 Hours before arrival.  Take these medicines the morning of surgery with A SIP OF WATER:pain medicine if needed is OK   Do not wear jewelry, make-up or nail polish.  Do not wear lotions, powders, or perfumes. You may wear deodorant.  Do not shave 48 hours prior to surgery.  Do not bring valuables to the hospital.  Contacts, dentures or bridgework may not be worn into surgery.  Leave suitcase in the car. After surgery it may be brought to your room.  For patients admitted to the hospital, checkout time is 11:00 AM the day of discharge.   Patients discharged the day of surgery will not be allowed to drive home.  Name and phone number of your driver:   Special Instructions: CHG Shower Use Special Wash: 1/2 bottle night before surgery and 1/2 bottle morning of surgery.   Please read over the following fact sheets that you were given: Pain Booklet, Coughing and Deep Breathing, Blood Transfusion Information, MRSA Information and Surgical Site Infection Prevention

## 2011-03-19 ENCOUNTER — Inpatient Hospital Stay (HOSPITAL_COMMUNITY): Payer: Medicare Other | Admitting: Critical Care Medicine

## 2011-03-19 ENCOUNTER — Encounter (HOSPITAL_COMMUNITY)
Admission: RE | Disposition: A | Discharge status: Skilled Nursing Facility | Payer: Self-pay | Source: Ambulatory Visit | Attending: Neurological Surgery

## 2011-03-19 ENCOUNTER — Encounter (HOSPITAL_COMMUNITY): Payer: Self-pay | Admitting: Critical Care Medicine

## 2011-03-19 ENCOUNTER — Encounter (HOSPITAL_COMMUNITY): Payer: Self-pay | Admitting: *Deleted

## 2011-03-19 ENCOUNTER — Encounter (HOSPITAL_COMMUNITY): Payer: Self-pay | Admitting: Neurological Surgery

## 2011-03-19 ENCOUNTER — Inpatient Hospital Stay (HOSPITAL_COMMUNITY): Payer: Medicare Other

## 2011-03-19 ENCOUNTER — Inpatient Hospital Stay (HOSPITAL_COMMUNITY)
Admission: RE | Admit: 2011-03-19 | Discharge: 2011-03-25 | DRG: 460 | Disposition: A | Discharge status: Skilled Nursing Facility | Payer: Medicare Other | Source: Ambulatory Visit | Attending: Neurological Surgery | Admitting: Neurological Surgery

## 2011-03-19 DIAGNOSIS — M4310 Spondylolisthesis, site unspecified: Principal | ICD-10-CM | POA: Diagnosis present

## 2011-03-19 DIAGNOSIS — E119 Type 2 diabetes mellitus without complications: Secondary | ICD-10-CM | POA: Diagnosis present

## 2011-03-19 DIAGNOSIS — M129 Arthropathy, unspecified: Secondary | ICD-10-CM | POA: Diagnosis present

## 2011-03-19 DIAGNOSIS — I10 Essential (primary) hypertension: Secondary | ICD-10-CM | POA: Diagnosis present

## 2011-03-19 DIAGNOSIS — IMO0001 Reserved for inherently not codable concepts without codable children: Secondary | ICD-10-CM | POA: Diagnosis present

## 2011-03-19 DIAGNOSIS — Z01810 Encounter for preprocedural cardiovascular examination: Secondary | ICD-10-CM

## 2011-03-19 DIAGNOSIS — G4730 Sleep apnea, unspecified: Secondary | ICD-10-CM | POA: Diagnosis present

## 2011-03-19 DIAGNOSIS — K219 Gastro-esophageal reflux disease without esophagitis: Secondary | ICD-10-CM | POA: Diagnosis present

## 2011-03-19 DIAGNOSIS — Z01812 Encounter for preprocedural laboratory examination: Secondary | ICD-10-CM

## 2011-03-19 DIAGNOSIS — M5416 Radiculopathy, lumbar region: Secondary | ICD-10-CM | POA: Diagnosis present

## 2011-03-19 DIAGNOSIS — M4316 Spondylolisthesis, lumbar region: Secondary | ICD-10-CM | POA: Diagnosis present

## 2011-03-19 DIAGNOSIS — D62 Acute posthemorrhagic anemia: Secondary | ICD-10-CM | POA: Diagnosis not present

## 2011-03-19 DIAGNOSIS — Z87891 Personal history of nicotine dependence: Secondary | ICD-10-CM

## 2011-03-19 DIAGNOSIS — Z79899 Other long term (current) drug therapy: Secondary | ICD-10-CM

## 2011-03-19 DIAGNOSIS — Z794 Long term (current) use of insulin: Secondary | ICD-10-CM

## 2011-03-19 HISTORY — DX: Spondylolisthesis, lumbar region: M43.16

## 2011-03-19 LAB — GLUCOSE, CAPILLARY: Glucose-Capillary: 99 mg/dL (ref 70–99)

## 2011-03-19 SURGERY — POSTERIOR LUMBAR FUSION 1 LEVEL
Anesthesia: General | Site: Back | Wound class: Clean

## 2011-03-19 MED ORDER — ONDANSETRON HCL 4 MG/2ML IJ SOLN: 4.0000 mg | Freq: Once | INTRAMUSCULAR | Status: DC | PRN

## 2011-03-19 MED ORDER — HYDROMORPHONE HCL PF 1 MG/ML IJ SOLN: 0.2500 mg | INTRAMUSCULAR | Status: DC | PRN

## 2011-03-19 MED ORDER — METHOCARBAMOL 500 MG PO TABS
500.0000 mg | ORAL_TABLET | Freq: Four times a day (QID) | ORAL | Status: DC | PRN
  Administered 2011-03-21 – 2011-03-25 (×11): 500 mg via ORAL
  Filled 2011-03-19 (×11): qty 1

## 2011-03-19 MED ORDER — SODIUM CHLORIDE 0.9 % IV SOLN: 250.0000 mL | INTRAVENOUS | Status: DC | PRN

## 2011-03-19 MED ORDER — CEFAZOLIN SODIUM 1-5 GM-% IV SOLN
INTRAVENOUS | Status: DC | PRN
  Administered 2011-03-19: 2 g via INTRAVENOUS

## 2011-03-19 MED ORDER — GLYCOPYRROLATE 0.2 MG/ML IJ SOLN
INTRAMUSCULAR | Status: DC | PRN
  Administered 2011-03-19: .6 mg via INTRAVENOUS

## 2011-03-19 MED ORDER — SODIUM CHLORIDE 0.9 % IJ SOLN: 9.0000 mL | INTRAMUSCULAR | Status: DC | PRN

## 2011-03-19 MED ORDER — MORPHINE SULFATE (PF) 1 MG/ML IV SOLN
INTRAVENOUS | Status: DC
  Administered 2011-03-19: 25 mg via INTRAVENOUS
  Administered 2011-03-20: 6 mg via INTRAVENOUS
  Administered 2011-03-20: 15 mg via INTRAVENOUS
  Administered 2011-03-20: 05:00:00 via INTRAVENOUS
  Administered 2011-03-20: 4.5 mg via INTRAVENOUS
  Administered 2011-03-20: 10.06 mg via INTRAVENOUS
  Administered 2011-03-21 (×2): 3 mg via INTRAVENOUS
  Filled 2011-03-19 (×2): qty 25

## 2011-03-19 MED ORDER — HEMOSTATIC AGENTS (NO CHARGE) OPTIME
TOPICAL | Status: DC | PRN
  Administered 2011-03-19: 1 via TOPICAL

## 2011-03-19 MED ORDER — SODIUM CHLORIDE 0.9 % IJ SOLN
3.0000 mL | Freq: Two times a day (BID) | INTRAMUSCULAR | Status: DC
  Administered 2011-03-19 – 2011-03-25 (×7): 3 mL via INTRAVENOUS

## 2011-03-19 MED ORDER — BUPIVACAINE HCL (PF) 0.5 % IJ SOLN
INTRAMUSCULAR | Status: DC | PRN
  Administered 2011-03-19: 30 mL

## 2011-03-19 MED ORDER — ONDANSETRON HCL 4 MG/2ML IJ SOLN: 4.0000 mg | Freq: Four times a day (QID) | INTRAMUSCULAR | Status: DC | PRN

## 2011-03-19 MED ORDER — PHENOL 1.4 % MT LIQD: 1.0000 | OROMUCOSAL | Status: DC | PRN

## 2011-03-19 MED ORDER — ACETAMINOPHEN 325 MG PO TABS
650.0000 mg | ORAL_TABLET | ORAL | Status: DC | PRN
  Administered 2011-03-20: 650 mg via ORAL
  Filled 2011-03-19 (×2): qty 2

## 2011-03-19 MED ORDER — THROMBIN 20000 UNITS EX KIT
PACK | CUTANEOUS | Status: DC | PRN
  Administered 2011-03-19: 20000 [IU] via TOPICAL

## 2011-03-19 MED ORDER — ONDANSETRON HCL 4 MG/2ML IJ SOLN
INTRAMUSCULAR | Status: DC | PRN
  Administered 2011-03-19: 4 mg via INTRAVENOUS

## 2011-03-19 MED ORDER — MEPERIDINE HCL 25 MG/ML IJ SOLN: 6.2500 mg | INTRAMUSCULAR | Status: DC | PRN

## 2011-03-19 MED ORDER — SODIUM CHLORIDE 0.9 % IV SOLN
INTRAVENOUS | Status: DC
  Administered 2011-03-19 – 2011-03-22 (×3): via INTRAVENOUS

## 2011-03-19 MED ORDER — FENTANYL CITRATE 0.05 MG/ML IJ SOLN
INTRAMUSCULAR | Status: DC | PRN
  Administered 2011-03-19: 100 ug via INTRAVENOUS
  Administered 2011-03-19 (×2): 50 ug via INTRAVENOUS
  Administered 2011-03-19: 100 ug via INTRAVENOUS
  Administered 2011-03-19 (×2): 50 ug via INTRAVENOUS

## 2011-03-19 MED ORDER — ROCURONIUM BROMIDE 100 MG/10ML IV SOLN
INTRAVENOUS | Status: DC | PRN
  Administered 2011-03-19: 50 mg via INTRAVENOUS

## 2011-03-19 MED ORDER — PROPOFOL 10 MG/ML IV EMUL
INTRAVENOUS | Status: DC | PRN
  Administered 2011-03-19: 50 mg via INTRAVENOUS
  Administered 2011-03-19: 100 mg via INTRAVENOUS

## 2011-03-19 MED ORDER — ONDANSETRON HCL 4 MG/2ML IJ SOLN: 4.0000 mg | INTRAMUSCULAR | Status: DC | PRN

## 2011-03-19 MED ORDER — DIPHENHYDRAMINE HCL 50 MG/ML IJ SOLN: 12.5000 mg | Freq: Four times a day (QID) | INTRAMUSCULAR | Status: DC | PRN

## 2011-03-19 MED ORDER — MORPHINE SULFATE 2 MG/ML IJ SOLN: 0.0500 mg/kg | INTRAMUSCULAR | Status: DC | PRN

## 2011-03-19 MED ORDER — MENTHOL 3 MG MT LOZG: 1.0000 | LOZENGE | OROMUCOSAL | Status: DC | PRN

## 2011-03-19 MED ORDER — HYDROMORPHONE HCL PF 1 MG/ML IJ SOLN
0.2500 mg | INTRAMUSCULAR | Status: DC | PRN
  Administered 2011-03-19 (×4): 0.5 mg via INTRAVENOUS

## 2011-03-19 MED ORDER — MIDAZOLAM HCL 5 MG/5ML IJ SOLN
INTRAMUSCULAR | Status: DC | PRN
  Administered 2011-03-19: 2 mg via INTRAVENOUS

## 2011-03-19 MED ORDER — SODIUM CHLORIDE 0.9 % IJ SOLN: 3.0000 mL | INTRAMUSCULAR | Status: DC | PRN

## 2011-03-19 MED ORDER — VECURONIUM BROMIDE 10 MG IV SOLR
INTRAVENOUS | Status: DC | PRN
  Administered 2011-03-19: 1 mg via INTRAVENOUS
  Administered 2011-03-19: 2 mg via INTRAVENOUS

## 2011-03-19 MED ORDER — LIDOCAINE-EPINEPHRINE 1 %-1:100000 IJ SOLN
INTRAMUSCULAR | Status: DC | PRN
  Administered 2011-03-19: 30 mL

## 2011-03-19 MED ORDER — LACTATED RINGERS IV SOLN
INTRAVENOUS | Status: DC | PRN
  Administered 2011-03-19 (×3): via INTRAVENOUS

## 2011-03-19 MED ORDER — NALOXONE HCL 0.4 MG/ML IJ SOLN: 0.4000 mg | INTRAMUSCULAR | Status: DC | PRN

## 2011-03-19 MED ORDER — METHOCARBAMOL 100 MG/ML IJ SOLN
500.0000 mg | Freq: Four times a day (QID) | INTRAVENOUS | Status: DC | PRN
  Filled 2011-03-19: qty 5

## 2011-03-19 MED ORDER — ACETAMINOPHEN 650 MG RE SUPP: 650.0000 mg | RECTAL | Status: DC | PRN

## 2011-03-19 MED ORDER — SODIUM CHLORIDE 0.9 % IR SOLN
Status: DC | PRN
  Administered 2011-03-19: 1000 mL

## 2011-03-19 MED ORDER — DOCUSATE SODIUM 100 MG PO CAPS
100.0000 mg | ORAL_CAPSULE | Freq: Two times a day (BID) | ORAL | Status: DC
  Administered 2011-03-19 – 2011-03-25 (×12): 100 mg via ORAL
  Filled 2011-03-19 (×11): qty 1

## 2011-03-19 MED ORDER — NEOSTIGMINE METHYLSULFATE 1 MG/ML IJ SOLN
INTRAMUSCULAR | Status: DC | PRN
  Administered 2011-03-19: 4 mg via INTRAVENOUS

## 2011-03-19 MED ORDER — SODIUM CHLORIDE 0.9 % IR SOLN
Status: DC | PRN
  Administered 2011-03-19: 15:00:00

## 2011-03-19 MED ORDER — ALUM & MAG HYDROXIDE-SIMETH 400-400-40 MG/5ML PO SUSP
30.0000 mL | Freq: Four times a day (QID) | ORAL | Status: DC | PRN
  Administered 2011-03-25: 30 mL via ORAL
  Filled 2011-03-19 (×2): qty 30

## 2011-03-19 MED ORDER — PHENYLEPHRINE HCL 10 MG/ML IJ SOLN
INTRAMUSCULAR | Status: DC | PRN
  Administered 2011-03-19: 40 ug via INTRAVENOUS
  Administered 2011-03-19: 80 ug via INTRAVENOUS

## 2011-03-19 MED ORDER — DIPHENHYDRAMINE HCL 12.5 MG/5ML PO ELIX: 12.5000 mg | ORAL_SOLUTION | Freq: Four times a day (QID) | ORAL | Status: DC | PRN

## 2011-03-19 SURGICAL SUPPLY — 66 items
ADH SKN CLS APL DERMABOND .7 (GAUZE/BANDAGES/DRESSINGS) ×1
BAG DECANTER FOR FLEXI CONT (MISCELLANEOUS) ×2 IMPLANT
BLADE SURG ROTATE 9660 (MISCELLANEOUS) IMPLANT
BUR MATCHSTICK NEURO 3.0 LAGG (BURR) ×2 IMPLANT
CAGE 11MM (Cage) ×2 IMPLANT
CANISTER SUCTION 2500CC (MISCELLANEOUS) ×2 IMPLANT
CLOTH BEACON ORANGE TIMEOUT ST (SAFETY) ×2 IMPLANT
CONT SPEC 4OZ CLIKSEAL STRL BL (MISCELLANEOUS) ×4 IMPLANT
COVER BACK TABLE 24X17X13 BIG (DRAPES) IMPLANT
COVER TABLE BACK 60X90 (DRAPES) ×2 IMPLANT
DECANTER SPIKE VIAL GLASS SM (MISCELLANEOUS) ×2 IMPLANT
DERMABOND ADVANCED (GAUZE/BANDAGES/DRESSINGS) ×1
DERMABOND ADVANCED .7 DNX12 (GAUZE/BANDAGES/DRESSINGS) ×1 IMPLANT
DRAPE C-ARM 42X72 X-RAY (DRAPES) ×4 IMPLANT
DRAPE LAPAROTOMY 100X72X124 (DRAPES) ×2 IMPLANT
DRAPE POUCH INSTRU U-SHP 10X18 (DRAPES) ×2 IMPLANT
DRAPE PROXIMA HALF (DRAPES) IMPLANT
DURAPREP 26ML APPLICATOR (WOUND CARE) ×2 IMPLANT
ELECT REM PT RETURN 9FT ADLT (ELECTROSURGICAL) ×2
ELECTRODE REM PT RTRN 9FT ADLT (ELECTROSURGICAL) ×1 IMPLANT
GAUZE SPONGE 4X4 16PLY XRAY LF (GAUZE/BANDAGES/DRESSINGS) IMPLANT
GLOVE BIO SURGEON STRL SZ8 (GLOVE) ×3 IMPLANT
GLOVE BIOGEL PI IND STRL 7.0 (GLOVE) IMPLANT
GLOVE BIOGEL PI IND STRL 8.5 (GLOVE) ×2 IMPLANT
GLOVE BIOGEL PI INDICATOR 7.0 (GLOVE) ×2
GLOVE BIOGEL PI INDICATOR 8.5 (GLOVE) ×4
GLOVE ECLIPSE 8.5 STRL (GLOVE) ×4 IMPLANT
GLOVE EXAM NITRILE LRG STRL (GLOVE) IMPLANT
GLOVE EXAM NITRILE MD LF STRL (GLOVE) ×1 IMPLANT
GLOVE EXAM NITRILE XL STR (GLOVE) IMPLANT
GLOVE EXAM NITRILE XS STR PU (GLOVE) IMPLANT
GLOVE INDICATOR 8.5 STRL (GLOVE) ×1 IMPLANT
GLOVE SURG SS PI 6.5 STRL IVOR (GLOVE) ×4 IMPLANT
GOWN BRE IMP SLV AUR LG STRL (GOWN DISPOSABLE) IMPLANT
GOWN BRE IMP SLV AUR XL STRL (GOWN DISPOSABLE) ×2 IMPLANT
GOWN STRL REIN 2XL LVL4 (GOWN DISPOSABLE) ×6 IMPLANT
KIT BASIN OR (CUSTOM PROCEDURE TRAY) ×2 IMPLANT
KIT ROOM TURNOVER OR (KITS) ×2 IMPLANT
MILL MEDIUM DISP (BLADE) ×1 IMPLANT
NDL SPNL 18GX3.5 QUINCKE PK (NEEDLE) IMPLANT
NEEDLE HYPO 22GX1.5 SAFETY (NEEDLE) ×2 IMPLANT
NEEDLE SPNL 18GX3.5 QUINCKE PK (NEEDLE) ×2 IMPLANT
NS IRRIG 1000ML POUR BTL (IV SOLUTION) ×2 IMPLANT
PACK FOAM VITOSS 10CC (Orthopedic Implant) ×1 IMPLANT
PACK LAMINECTOMY NEURO (CUSTOM PROCEDURE TRAY) ×2 IMPLANT
PAD ARMBOARD 7.5X6 YLW CONV (MISCELLANEOUS) ×6 IMPLANT
PATTIES SURGICAL .5 X1 (DISPOSABLE) ×2 IMPLANT
PUREGEN 2.0ML LRG (Bone Implant) ×1 IMPLANT
ROD 30MM (Rod) ×2 IMPLANT
SCREW BONE SPINE 30MM (Screw) ×2 IMPLANT
SCREW POLYAX 6.5X30MM (Screw) ×2 IMPLANT
SCREW SET SPINAL STD HEXALOBE (Screw) ×4 IMPLANT
SPONGE GAUZE 4X4 12PLY (GAUZE/BANDAGES/DRESSINGS) ×2 IMPLANT
SPONGE LAP 4X18 X RAY DECT (DISPOSABLE) IMPLANT
SPONGE SURGIFOAM ABS GEL 100 (HEMOSTASIS) ×2 IMPLANT
SUT VIC AB 1 CT1 18XBRD ANBCTR (SUTURE) ×1 IMPLANT
SUT VIC AB 1 CT1 8-18 (SUTURE) ×2
SUT VIC AB 2-0 CP2 18 (SUTURE) ×2 IMPLANT
SUT VIC AB 3-0 SH 8-18 (SUTURE) ×2 IMPLANT
SYR 20ML ECCENTRIC (SYRINGE) ×2 IMPLANT
SYR 5ML LL (SYRINGE) ×1 IMPLANT
TOWEL OR 17X24 6PK STRL BLUE (TOWEL DISPOSABLE) ×2 IMPLANT
TOWEL OR 17X26 10 PK STRL BLUE (TOWEL DISPOSABLE) ×2 IMPLANT
TRAP SPECIMEN MUCOUS 40CC (MISCELLANEOUS) ×2 IMPLANT
TRAY FOLEY CATH 14FRSI W/METER (CATHETERS) ×2 IMPLANT
WATER STERILE IRR 1000ML POUR (IV SOLUTION) ×2 IMPLANT

## 2011-03-19 NOTE — Transfer of Care (Signed)
Immediate Anesthesia Transfer of Care Note  Patient: [name]  Procedure(s) Performed:  POSTERIOR LUMBAR FUSION 1 LEVEL - LUMBAR FIVE SACRAL ONE Decompression/Fusion  Patient Location: PACU  Anesthesia Type: General  Level of Consciousness: sedated  Airway & Oxygen Therapy: Patient Spontanous Breathing and Patient connected to face mask oxygen  Post-op Assessment: Report given to PACU RN and Post -op Vital signs reviewed and stable  Post vital signs: stable  Complications: No apparent anesthesia complications

## 2011-03-19 NOTE — Anesthesia Preprocedure Evaluation (Addendum)
Anesthesia Evaluation  Patient identified by MRN, date of birth, ID band Patient awake    Reviewed: Allergy & Precautions, H&P , NPO status , Patient's Chart, lab work & pertinent test results  Airway Mallampati: I TM Distance: >3 FB Neck ROM: Full    Dental No notable dental hx. (+) Teeth Intact and Dental Advisory Given   Pulmonary asthma , sleep apnea ,    Pulmonary exam normal       Cardiovascular hypertension, Pt. on medications Regular Normal    Neuro/Psych  Neuromuscular disease    GI/Hepatic GERD-  Medicated and Controlled,  Endo/Other  Diabetes mellitus-  Renal/GU      Musculoskeletal   Abdominal   Peds  Hematology   Anesthesia Other Findings   Reproductive/Obstetrics                          Anesthesia Physical Anesthesia Plan  ASA: III  Anesthesia Plan: General   Post-op Pain Management:    Induction: Intravenous  Airway Management Planned: Oral ETT  Additional Equipment:   Intra-op Plan:   Post-operative Plan: Extubation in OR  Informed Consent: I have reviewed the patients History and Physical, chart, labs and discussed the procedure including the risks, benefits and alternatives for the proposed anesthesia with the patient or authorized representative who has indicated his/her understanding and acceptance.   Dental advisory given  Plan Discussed with: CRNA and Surgeon  Anesthesia Plan Comments:         Anesthesia Quick Evaluation

## 2011-03-19 NOTE — Op Note (Signed)
Operative note  Preoperative diagnosis: Lumbar spondylolisthesis, acquired. Lumbar radiculopathy, status post bilateral laminotomies and decompression L5-S1 Postoperative diagnosis: Lumbar spondylolisthesis, acquired lumbar radiculopathy, status post bilateral laminotomies and decompression L5-S1 Surgeon: Barnett Abu M.D.  assistant: Maeola Harman M.D. Anesthesia: Interval endotracheal Indications: [name] is a 70 year old lady who has had generative spondylolisthesis at L5-S1. 2 months ago she underwent decompression of the lateral recesses because of significant symptoms of neurogenic claudication and radiculopathy. She felt improved for the first 6 weeks time subsequently developed recurrent radiculopathy. [name] radiographs demonstrate that the patient has evolved her spondylolisthesis and additional grade in the last 2-1/2 months. She has been advised regarding the need for decompression and stabilization of the L5-S1 joint.  Procedure: Patient was brought to the operating table supine on a stretcher. After the smooth induction of general endotracheal anesthesia, she was turned prone and the back was prepped with alcohol and DuraPrep. After sterile draping was applied an elliptical incision was created around her previous scar and this tissue was excised. Dissection was carried to the lumbar dorsal fascia to expose the lower spinous processes of L5 and S1. L5 was identified positively on the radiograph and the previous laminotomies were then exposed carefully dissecting the adherent scar tissue around the dura. The S1 nerve root was freed as was the L5 nerve root in the exiting foramen. He was at this point that I noticed the facet joint was largely fractured from the inferior portion of the pars at L5. This will had occurred bilaterally. Moule of these fragments allowed further decompression into the lateral recesses particularly around the L5 nerve root. The disc space was then identified and the  common dural tube could be retracted medially away from the disc. A radical discectomy was performed at L5-S1. This included removing all the disc material medially laterally and to the depth of the anterior longitudinal ligament at L5-S1. The endplates were curetted to relieve them of any endplate cartilage. An interbody spreader was used to distract the disc space. 11 mm peek spacers could be fitted snugly into the interspace. These were then packed with the cost bone sponge that was prepared with pure GEN. The interbody space was then also packed with additional Vitoss bone sponge.  Next we used fluoroscopic guidance to place pedicle screws in L5 and S1 and L5 medial to lateral trajectory screws were placed using fluoroscopic guidance. 5.5 x 30 mm screws were placed in L5 after drilling then tapping and sounding the holes to make sure there was no evidence of cutout. 6.5 x 30 mm screws were placed in the medial to lateral trajectory and S1. This was done in a similar fashion. To assure 30 mm precontoured rods were then used to connect the pedicle screws at L5-S1. The construct was tightened into final locking position in a neutral construct. The lateral gutters which had previously been decorticated were packed with the remaining portions of the cost bone sponge and some autograft that had been prepared from the laminotomies. Once hemostasis was secured, the lumbar dorsal fascia was closed with #1 Vicryl. 23 cc of half percent Marcaine was injected into the paraspinous fascia at this time. Subcutaneous tissues were then closed with 2-0 Vicryl in an inverted interrupted fashion and 3-0 Vicryl is used to close subcuticular skin. Blood loss was estimated at 400 cc, 190 cc of Cell Saver blood was returned to the patient.

## 2011-03-19 NOTE — Preoperative (Addendum)
Beta Blockers   Reason not to administer Beta Blockers:Not Applicable 

## 2011-03-19 NOTE — Addendum Note (Signed)
Addendum  created 03/19/11 1701 by Kerby Nora, MD   Modules edited:Orders, PRL Based Order Sets

## 2011-03-19 NOTE — H&P (Signed)
[name] is an 70 y.o. female.   Chief Complaint: back and leg pain  HPI: Patient is a 70 year old right-handed female who's had back pain and leg pain secondary to severe stenosis at the level of L5-S1 due to a degenerative spondylolisthesis. She had denied pain in her back and had notable radicular syndrome which was decompressed 2 months ago. Initially she did quite well with relief of the worst of her symptoms of leg pain however in the past few weeks the pain has returned with a vengeance. Ain't radiographs performed on an outpatient basis demonstrated that there is advanced spondylolisthesis over what was present before. Repeat CT myelography demonstrates that she has again generated severe stenosis and because of these acute changes she is now admitted to undergo surgical decompression and stabilization of L5-S1.  Past Medical History  Diagnosis Date  . Complication of anesthesia     wakes up shaking  . Diabetes mellitus   . Heart murmur     sees Dr. Shana Chute, last seen- early 2012  . Asthma     has used inhaler in past for asthmatic bronchitis, last time- early 2012  . Sleep apnea     borderline sleep apnea, states she no longer uses, early 2012- stopped using   . GERD (gastroesophageal reflux disease)     occas. use of  Prilosec  . Neuromuscular disorder     lumbar radiculopathy, lumbago  . Fibromyalgia   . Arthritis   . Hypertension     02/2010- stress test /w PCP    Past Surgical History  Procedure Date  . Back surgery     2012  . Abdominal hysterectomy   . Adb.cyst     ovarian cyst  . Eye surgery     macular degeneration treatment - injections    Family History  Problem Relation Age of Onset  . Anesthesia problems Neg Hx   . Hypotension Neg Hx   . Malignant hyperthermia Neg Hx   . Pseudochol deficiency Neg Hx    Social History:  reports that she quit smoking about 12 years ago. She does not have any smokeless tobacco history on file. She reports that she  drinks alcohol. She reports that she does not use illicit drugs.  Allergies:  Allergies  Allergen Reactions  . Adhesive (Tape) Rash    Medications Prior to Admission  Medication Dose Route Frequency Provider Last Rate Last Dose  . bupivacaine (MARCAINE) 0.5 % injection    PRN Shary Key Daliya Parchment   30 mL at 03/19/11 1225  . hemostatic agents    PRN Shary Key Areeb Corron   1 application at 03/19/11 1226  . lidocaine-EPINEPHrine (XYLOCAINE W/EPI) 1 %-1:100000 (with pres) injection    PRN Shary Key Ilyaas Musto   30 mL at 03/19/11 1227  . sodium chloride irrigation 0.9 %    PRN Shary Key Tylen Leverich   1,000 mL at 03/19/11 1224  . thrombin spray    PRN Shary Key Neville Walston   20,000 Units at 03/19/11 1227   No current outpatient prescriptions on file as of 03/19/2011.    Results for orders placed during the hospital encounter of 03/19/11 (from the past 48 hour(s))  GLUCOSE, CAPILLARY     Status: Normal   Collection Time   03/19/11 11:05 AM      Component Value Range Comment   Glucose-Capillary 99  70 - 99 (mg/dL)    No results found.  Review of Systems  Constitutional: Negative.   HENT: Negative.  Eyes: Negative.   Respiratory: Negative.   Gastrointestinal: Negative.   Genitourinary: Negative.   Musculoskeletal: Positive for back pain.  Neurological: Positive for tingling.       Pain and weakness in both lower extremities  Endo/Heme/Allergies: Negative.   Psychiatric/Behavioral: Negative.     Blood pressure 155/93, pulse 77, temperature 98.4 F (36.9 C), temperature source Oral, resp. rate 18, SpO2 99.00%. Physical Exam she is alert oriented scratch cranial nerves II through XII are intact. Back is tender to palpation diffusely motor strength is decreased in the tibialis anterior 4 minus out of 5 bilaterally biceps strength is decreased to 4 minus out of 5 bilaterally gastroc strength is 4 minus out of 5 bilaterally sensation appears intact in the lower extremity.  General physical exam reveals the heart has  a regular rate and rhythm 2/6 systolic murmur is heard at the base lungs are clear to auscultation the abdomen is soft bowel sounds are positive no masses are noted. Stream these reveal no cyanosis clubbing or edema.  Assessment/Plan Patient has a worsening spondylolisthesis at L5-S1. She is now admitted to undergo surgical decompression and stabilization of L5-S1.  Ilda Laskin J 03/19/2011, 1:30 PM

## 2011-03-19 NOTE — Anesthesia Postprocedure Evaluation (Signed)
  Anesthesia Post-op Note  Patient: [name]  Procedure(s) Performed:  POSTERIOR LUMBAR FUSION 1 LEVEL - LUMBAR FIVE SACRAL ONE Decompression/Fusion  Patient Location: PACU  Anesthesia Type: General  Level of Consciousness: awake  Airway and Oxygen Therapy: Patient Spontanous Breathing and Patient connected to nasal cannula oxygen  Post-op Pain: mild  Post-op Assessment: Post-op Vital signs reviewed, Patient's Cardiovascular Status Stable, Respiratory Function Stable, Patent Airway, No signs of Nausea or vomiting and Pain level controlled  Post-op Vital Signs: Reviewed and stable  Complications: No apparent anesthesia complications

## 2011-03-19 NOTE — Anesthesia Procedure Notes (Signed)
Procedure Name: Intubation Date/Time: 03/19/2011 1:46 PM Performed by: Elon Alas Pre-anesthesia Checklist: Patient identified, Timeout performed, Emergency Drugs available, Suction available and Patient being monitored Patient Re-evaluated:Patient Re-evaluated prior to inductionOxygen Delivery Method: Circle System Utilized Preoxygenation: Pre-oxygenation with 100% oxygen Intubation Type: IV induction and Circoid Pressure applied Ventilation: Mask ventilation without difficulty and Oral airway inserted - appropriate to patient size Laryngoscope Size: Mac and 3 Grade View: Grade II Tube type: Oral Tube size: 7.0 mm Number of attempts: 1 Airway Equipment and Method: stylet Placement Confirmation: ETT inserted through vocal cords under direct vision,  positive ETCO2 and breath sounds checked- equal and bilateral Secured at: 21 cm Tube secured with: Tape Dental Injury: Teeth and Oropharynx as per pre-operative assessment

## 2011-03-20 LAB — CBC
HCT: 32 % — ABNORMAL LOW (ref 36.0–46.0)
MCH: 27.7 pg (ref 26.0–34.0)
MCV: 83.8 fL (ref 78.0–100.0)
Platelets: 167 10*3/uL (ref 150–400)
RBC: 3.82 MIL/uL — ABNORMAL LOW (ref 3.87–5.11)

## 2011-03-20 LAB — BASIC METABOLIC PANEL
BUN: 13 mg/dL (ref 6–23)
CO2: 31 mEq/L (ref 19–32)
Calcium: 9 mg/dL (ref 8.4–10.5)
Creatinine, Ser: 0.8 mg/dL (ref 0.50–1.10)

## 2011-03-20 LAB — GLUCOSE, CAPILLARY: Glucose-Capillary: 270 mg/dL — ABNORMAL HIGH (ref 70–99)

## 2011-03-20 MED ORDER — MORPHINE SULFATE (PF) 1 MG/ML IV SOLN
INTRAVENOUS | Status: AC
  Filled 2011-03-20: qty 25

## 2011-03-20 NOTE — Progress Notes (Signed)
Physical Therapy Evaluation Patient Details Name: [name] MRN: [id number] DOB: Jun 16, 1940 Today's Date: 03/20/2011  Problem List:  Patient Active Problem List  Diagnoses  . Spondylolisthesis of lumbar region  . Lumbar radicular pain    Past Medical History:  Past Medical History  Diagnosis Date  . Complication of anesthesia     wakes up shaking  . Diabetes mellitus   . Heart murmur     sees Dr. Shana Chute, last seen- early 2012  . Asthma     has used inhaler in past for asthmatic bronchitis, last time- early 2012  . Sleep apnea     borderline sleep apnea, states she no longer uses, early 2012- stopped using   . GERD (gastroesophageal reflux disease)     occas. use of  Prilosec  . Neuromuscular disorder     lumbar radiculopathy, lumbago  . Fibromyalgia   . Arthritis   . Hypertension     02/2010- stress test /w PCP   Past Surgical History:  Past Surgical History  Procedure Date  . Back surgery     2012  . Abdominal hysterectomy   . Adb.cyst     ovarian cyst  . Eye surgery     macular degeneration treatment - injections    PT Assessment/Plan/Recommendation PT Assessment Clinical Impression Statement: Pt is status post back surgery who presents with acute pain limiting mobility. Pt reports she lives alone and will not have family support. Pt is anticipating possible SNF. Pt presents with decreased mobility and tolerance for activity. Pt would benefit from skilled PT to improved I with mobility.  Pt may need SNF  placement. Pt was able to transfer to chair with only S, but it required a long amount of time. PT Recommendation/Assessment: Patient will need skilled PT in the acute care venue PT Problem List: Decreased activity tolerance;Decreased mobility;Decreased knowledge of use of DME;Decreased knowledge of precautions Barriers to Discharge: Decreased caregiver support Barriers to Discharge Comments: Pt reports she will not have any help and may need SNF  placement PT Therapy Diagnosis : Difficulty walking;Acute pain PT Plan PT Frequency: Min 5X/week PT Treatment/Interventions: DME instruction;Gait training;Stair training;Functional mobility training;Therapeutic exercise;Patient/family education PT Recommendation Recommendations for Other Services: OT consult Follow Up Recommendations: Home health PT;Skilled nursing facility (depending on progress) Equipment Recommended: None recommended by PT PT Goals  Acute Rehab PT Goals PT Goal Formulation: With patient Time For Goal Achievement: 7 days Pt will go Supine/Side to Sit: with modified independence PT Goal: Supine/Side to Sit - Progress: Progressing toward goal Pt will go Sit to Supine/Side: with modified independence PT Goal: Sit to Supine/Side - Progress: Progressing toward goal Pt will Transfer Sit to Stand/Stand to Sit: with modified independence PT Transfer Goal: Sit to Stand/Stand to Sit - Progress: Progressing toward goal Pt will Transfer Bed to Chair/Chair to Bed: with modified independence PT Transfer Goal: Bed to Chair/Chair to Bed - Progress: Progressing toward goal Pt will Ambulate: >150 feet;with modified independence;with least restrictive assistive device Additional Goals Additional Goal #1: Pt will recall and also demonstrate 3/3 back precautions I.  PT Evaluation Precautions/Restrictions  Precautions Precautions: Back Precaution Comments: Reviewed back precautions Required Braces or Orthoses: Yes Spinal Brace: Lumbar corset;Applied in sitting position Prior Functioning  Home Living Lives With: Alone Type of Home: House Home Layout: Two level;Able to live on main level with bedroom/bathroom Alternate Level Stairs-Number of Steps: 1 flight Home Access: Stairs to enter Entrance Stairs-Rails:  (1 rail- pt unsure which side) Entrance Stairs-Number  of Steps: 2 Home Adaptive Equipment: Walker - rolling Prior Function Level of Independence: Independent with  homemaking with ambulation Driving: Yes Vocation: Retired Financial risk analyst Arousal/Alertness: Awake/alert Overall Cognitive Status: Appears within functional limits for tasks assessed Orientation Level: Oriented X4 Sensation/Coordination Sensation Light Touch: Appears Intact Extremity Assessment RLE Assessment RLE Assessment: Within Functional Limits LLE Assessment LLE Assessment: Within Functional Limits Mobility (including Balance) Bed Mobility Bed Mobility: Yes Rolling Left: 5: Supervision Rolling Left Details (indicate cue type and reason): cues for log roll technique, increased time Left Sidelying to Sit: 5: Supervision Left Sidelying to Sit Details (indicate cue type and reason): cues for technique and increased time. Took pt approx. 40 minutes to sit EOB. Sitting - Scoot to Edge of Bed: 5: Supervision Sitting - Scoot to Sasser of Bed Details (indicate cue type and reason): demo cues for technique.  Transfers Transfers: Yes Stand Pivot Transfers: 5: Supervision Ambulation/Gait Ambulation/Gait: No (Corset has not arrived)  Posture/Postural Control Posture/Postural Control: No significant limitations Balance Balance Assessed: Yes Static Sitting Balance Static Sitting - Balance Support: Bilateral upper extremity supported Static Sitting - Level of Assistance: 6: Modified independent (Device/Increase time) Exercise    End of Session PT - End of Session Equipment Utilized During Treatment: Other (comment) (Corset has not arrived- ok to transfer) Activity Tolerance: Patient limited by pain (Pt very very slow moving taking 45 mins. to transfer) Patient left: in chair;with call bell in reach Nurse Communication: Mobility status for ambulation General Behavior During Session: Other (comment) (tearful and resistant to activity) Cognition: WFL for tasks performed  Greggory Stallion 03/20/2011, 9:27 AM

## 2011-03-20 NOTE — Progress Notes (Signed)
Subjective: Patient reports tolerating PO, no problems voiding and moderate pain  Objective: Vital signs in last 24 hours: Temp:  [98.2 F (36.8 C)-99.2 F (37.3 C)] 99.2 F (37.3 C) (11/28 1802) Pulse Rate:  [68-90] 90  (11/28 1802) Resp:  [18-22] 18  (11/28 1802) BP: (130-138)/(61-86) 131/67 mmHg (11/28 1802) SpO2:  [92 %-100 %] 100 % (11/28 1802) Weight:  [69.4 kg (153 lb)] 153 lb (69.4 kg) (11/28 0003)  Intake/Output from previous day: 11/27 0701 - 11/28 0700 In: 2365 [I.V.:2000; Blood:190] Out: 2825 [Urine:2425; Blood:400] Intake/Output this shift:    motor function good, mobilizing well but slowly  Lab Results:  [facility] 03/20/11 0510  WBC 6.8  HGB 10.6*  HCT 32.0*  PLT 167   BMET  Basename 03/20/11 0510  NA 141  K 3.5  CL 104  CO2 31  GLUCOSE 151*  BUN 13  CREATININE 0.80  CALCIUM 9.0    Studies/Results: Dg Lumbar Spine 2-3 Views  03/19/2011  *RADIOLOGY REPORT*  Clinical Data: L5-S1 fusion.  LUMBAR SPINE - 2-3 VIEW  Comparison: Previous examinations, including earlier today.  Findings: Frontal and lateral C-arm views of the lumbosacral region demonstrate interval interbody bone plug and pedicle screw and rod fixation at the L5-S1 level.  The bony margins are difficult to visualize in that region on the lateral view.  There is grossly normal alignment.  IMPRESSION: Interbody bone plug and hardware fusion at the L5-S1 level with normal alignment.  Original Report Authenticated By: Darrol Angel, M.D.   Dg Spine Portable 1 View  03/19/2011  *RADIOLOGY REPORT*  Clinical Data: L5-S1 fusion.  DG SPINE PORTABLE - 1 VIEW  Comparison: 03/01/2011 and lumbar spine MR dated 11/19/2010.  Findings: There are no levels labeled on the previous lumbar spine radiographs obtained at [facility] on 03/01/2011.  On the MR dated 11/19/2010, the last open disc space is labeled the L5-S1 level.  Therefore, the last open disc space on the current images  will be labeled accordingly.  A portable cross-table lateral view of the lumbar spine obtained at 1425 hours demonstrates a metallic localizer with its tip projected posterior to the L5-S1 disc space.  There is stable grade 1 anterolisthesis at that level.  A second localizer is demonstrated with its tip projected over the facets at the L5 level.  There is moderate to marked disc space narrowing at the L5-S1 level with a vacuum phenomenon.  Mild anterior spur formation at other levels.  IMPRESSION: Localizers at the L5 and L5-S1 levels, as described above.  Original Report Authenticated By: Darrol Angel, M.D.   Dg C-arm 1-60 Min  03/19/2011  CLINICAL DATA: L5-S1 PLIF   C-ARM 1-60 MINUTES  Fluoroscopy was utilized by the requesting physician.  No radiographic  interpretation.      Assessment/Plan: Stable POD 1 .  LOS: 1 day  Will ask rehab med to see if rehab willbe required. Also social svs to see regarding convalescence center as patient lives alone.   [name] 03/20/2011, 7:25 PM

## 2011-03-21 DIAGNOSIS — IMO0002 Reserved for concepts with insufficient information to code with codable children: Secondary | ICD-10-CM

## 2011-03-21 LAB — GLUCOSE, CAPILLARY
Glucose-Capillary: 165 mg/dL — ABNORMAL HIGH (ref 70–99)
Glucose-Capillary: 216 mg/dL — ABNORMAL HIGH (ref 70–99)
Glucose-Capillary: 198 mg/dL — ABNORMAL HIGH (ref 70–99)
Glucose-Capillary: 160 mg/dL — ABNORMAL HIGH (ref 70–99)

## 2011-03-21 MED ORDER — GABAPENTIN 100 MG PO CAPS
100.0000 mg | ORAL_CAPSULE | Freq: Two times a day (BID) | ORAL | Status: DC
  Administered 2011-03-21 – 2011-03-25 (×8): 100 mg via ORAL
  Filled 2011-03-21 (×9): qty 1

## 2011-03-21 MED ORDER — VITAMIN B-6 50 MG PO TABS
50.0000 mg | ORAL_TABLET | Freq: Every day | ORAL | Status: DC
  Administered 2011-03-21 – 2011-03-25 (×5): 50 mg via ORAL
  Filled 2011-03-21 (×5): qty 1

## 2011-03-21 MED ORDER — EZETIMIBE 10 MG PO TABS
10.0000 mg | ORAL_TABLET | Freq: Every day | ORAL | Status: DC
  Administered 2011-03-21 – 2011-03-25 (×5): 10 mg via ORAL
  Filled 2011-03-21 (×5): qty 1

## 2011-03-21 MED ORDER — PREGABALIN 50 MG PO CAPS
50.0000 mg | ORAL_CAPSULE | Freq: Every day | ORAL | Status: DC
  Administered 2011-03-24: 50 mg via ORAL
  Filled 2011-03-21: qty 1

## 2011-03-21 MED ORDER — OLMESARTAN MEDOXOMIL 40 MG PO TABS
40.0000 mg | ORAL_TABLET | Freq: Every day | ORAL | Status: DC
  Administered 2011-03-21 – 2011-03-25 (×5): 40 mg via ORAL
  Filled 2011-03-21 (×5): qty 1

## 2011-03-21 MED ORDER — LINAGLIPTIN 5 MG PO TABS
5.0000 mg | ORAL_TABLET | Freq: Every day | ORAL | Status: DC
  Administered 2011-03-21 – 2011-03-25 (×5): 5 mg via ORAL
  Filled 2011-03-21 (×5): qty 1

## 2011-03-21 MED ORDER — OXYCODONE-ACETAMINOPHEN 5-325 MG PO TABS
1.0000 | ORAL_TABLET | ORAL | Status: DC | PRN
  Administered 2011-03-21 – 2011-03-23 (×7): 2 via ORAL
  Administered 2011-03-23: 1 via ORAL
  Administered 2011-03-23: 2 via ORAL
  Administered 2011-03-23: 1 via ORAL
  Administered 2011-03-24 (×4): 2 via ORAL
  Filled 2011-03-21 (×15): qty 2

## 2011-03-21 MED ORDER — GABAPENTIN 100 MG PO CAPS: 100.0000 mg | ORAL_CAPSULE | Freq: Three times a day (TID) | ORAL | Status: DC

## 2011-03-21 MED ORDER — VALSARTAN-HYDROCHLOROTHIAZIDE 320-12.5 MG PO TABS: 1.0000 | ORAL_TABLET | Freq: Every day | ORAL | Status: DC

## 2011-03-21 MED ORDER — POTASSIUM CHLORIDE CRYS ER 20 MEQ PO TBCR
20.0000 meq | EXTENDED_RELEASE_TABLET | Freq: Every day | ORAL | Status: DC
  Administered 2011-03-21 – 2011-03-25 (×5): 20 meq via ORAL
  Filled 2011-03-21 (×5): qty 1

## 2011-03-21 MED ORDER — INSULIN ASPART 100 UNIT/ML ~~LOC~~ SOLN
3.0000 [IU] | Freq: Three times a day (TID) | SUBCUTANEOUS | Status: DC
  Filled 2011-03-21: qty 3

## 2011-03-21 MED ORDER — GABAPENTIN 300 MG PO CAPS
300.0000 mg | ORAL_CAPSULE | Freq: Every day | ORAL | Status: DC
  Administered 2011-03-21 – 2011-03-24 (×4): 300 mg via ORAL
  Filled 2011-03-21 (×5): qty 1

## 2011-03-21 MED ORDER — HYDROCHLOROTHIAZIDE 12.5 MG PO CAPS
12.5000 mg | ORAL_CAPSULE | Freq: Every day | ORAL | Status: DC
  Administered 2011-03-21 – 2011-03-25 (×5): 12.5 mg via ORAL
  Filled 2011-03-21 (×5): qty 1

## 2011-03-21 MED ORDER — VITAMIN B-12 100 MCG PO TABS
50.0000 ug | ORAL_TABLET | Freq: Every day | ORAL | Status: DC
  Administered 2011-03-21 – 2011-03-23 (×3): via ORAL
  Administered 2011-03-24 – 2011-03-25 (×2): 50 ug via ORAL
  Filled 2011-03-21 (×5): qty 1

## 2011-03-21 MED ORDER — SIMVASTATIN 40 MG PO TABS
40.0000 mg | ORAL_TABLET | Freq: Every day | ORAL | Status: DC
  Administered 2011-03-21 – 2011-03-24 (×4): 40 mg via ORAL
  Filled 2011-03-21 (×5): qty 1

## 2011-03-21 MED ORDER — INSULIN ASPART 100 UNIT/ML ~~LOC~~ SOLN
3.0000 [IU] | Freq: Three times a day (TID) | SUBCUTANEOUS | Status: DC
  Administered 2011-03-21 – 2011-03-25 (×12): 3 [IU] via SUBCUTANEOUS
  Filled 2011-03-21: qty 3

## 2011-03-21 MED ORDER — ZOLPIDEM TARTRATE 5 MG PO TABS: 5.0000 mg | ORAL_TABLET | Freq: Every evening | ORAL | Status: DC | PRN

## 2011-03-21 MED ORDER — PANTOPRAZOLE SODIUM 40 MG PO TBEC
40.0000 mg | DELAYED_RELEASE_TABLET | Freq: Every day | ORAL | Status: DC
  Administered 2011-03-21 – 2011-03-25 (×5): 40 mg via ORAL
  Filled 2011-03-21 (×5): qty 1

## 2011-03-21 MED ORDER — CALCIUM CARBONATE-VITAMIN D 500-200 MG-UNIT PO TABS
1.0000 | ORAL_TABLET | Freq: Every day | ORAL | Status: DC
  Administered 2011-03-21 – 2011-03-25 (×5): 1 via ORAL
  Filled 2011-03-21 (×5): qty 1

## 2011-03-21 MED ORDER — GLIMEPIRIDE 4 MG PO TABS
4.0000 mg | ORAL_TABLET | Freq: Every day | ORAL | Status: DC
  Administered 2011-03-22 – 2011-03-25 (×4): 4 mg via ORAL
  Filled 2011-03-21 (×5): qty 1

## 2011-03-21 MED FILL — Sodium Chloride IV Soln 0.9%: INTRAVENOUS | Qty: 1000 | Status: AC

## 2011-03-21 MED FILL — Heparin Sodium (Porcine) Inj 1000 Unit/ML: INTRAMUSCULAR | Qty: 30 | Status: AC

## 2011-03-21 NOTE — Progress Notes (Signed)
Occupational Therapy Evaluation Patient Details Name: [name] MRN: [id number] DOB: 08/19/1940 Today's Date: 03/21/2011 10:39-10:57  evII Problem List:  Patient Active Problem List  Diagnoses  . Spondylolisthesis of lumbar region  . Lumbar radicular pain    Past Medical History:  Past Medical History  Diagnosis Date  . Complication of anesthesia     wakes up shaking  . Diabetes mellitus   . Heart murmur     sees Dr. Shana Chute, last seen- early 2012  . Asthma     has used inhaler in past for asthmatic bronchitis, last time- early 2012  . Sleep apnea     borderline sleep apnea, states she no longer uses, early 2012- stopped using   . GERD (gastroesophageal reflux disease)     occas. use of  Prilosec  . Neuromuscular disorder     lumbar radiculopathy, lumbago  . Fibromyalgia   . Arthritis   . Hypertension     02/2010- stress test /w PCP   Past Surgical History:  Past Surgical History  Procedure Date  . Back surgery     2012  . Abdominal hysterectomy   . Adb.cyst     ovarian cyst  . Eye surgery     macular degeneration treatment - injections    OT Assessment/Plan/Recommendation OT Assessment Clinical Impression Statement: 70 year old female with redo of lumbar fusion.  Pt presents with increased weakness and decreased ability to perform basic ADLs, while maintaining back precautions.  Feel pt will benefit from acute OT services to help strengthen and educate pt on appropriate performance of selfcare tasks. OT Recommendation/Assessment: Patient will need skilled OT in the acute care venue OT Problem List: Decreased strength;Decreased knowledge of precautions;Decreased knowledge of use of DME or AE OT Therapy Diagnosis : Generalized weakness OT Plan OT Frequency: Min 2X/week OT Treatment/Interventions: Self-care/ADL training;Therapeutic activities;DME and/or AE instruction;Patient/family education OT Recommendation Follow Up Recommendations: None Equipment  Recommended: None recommended by OT Individuals Consulted Consulted and Agree with Results and Recommendations: Patient OT Goals Acute Rehab OT Goals OT Goal Formulation: With patient ADL Goals Pt Will Perform Lower Body Bathing: with supervision;with adaptive equipment;Sit to stand from bed ADL Goal: Lower Body Bathing - Progress: Progressing toward goals Pt Will Perform Lower Body Dressing: with supervision;with adaptive equipment;Sit to stand from bed ADL Goal: Lower Body Dressing - Progress: Progressing toward goals Pt Will Transfer to Toilet: with supervision;3-in-1;Ambulation;with DME ADL Goal: Toilet Transfer - Progress: Progressing toward goals Pt Will Perform Toileting - Clothing Manipulation: with supervision;Sitting on 3-in-1 or toilet ADL Goal: Toileting - Clothing Manipulation - Progress: Progressing toward goals Pt Will Perform Toileting - Hygiene: with supervision;Sit to stand from 3-in-1/toilet ADL Goal: Toileting - Hygiene - Progress: Progressing toward goals Pt Will Perform Tub/Shower Transfer: Shower transfer;with supervision;Shower seat with back;Ambulation;with DME ADL Goal: Tub/Shower Transfer - Progress: Progressing toward goals  OT Evaluation Precautions/Restrictions  Precautions Precautions: Back Precaution Comments: Reviewed back precautions Required Braces or Orthoses: Yes Spinal Brace: Lumbar corset Restrictions Weight Bearing Restrictions: No Prior Functioning Home Living Lives With: Alone Receives Help From: Family (Daughter assists.) Type of Home: House Home Layout: Two level;Able to live on main level with bedroom/bathroom Alternate Level Stairs-Number of Steps: 1 flight Home Access: Stairs to enter Entrance Stairs-Rails:  (1 rail- pt unsure which side) Entrance Stairs-Number of Steps: 2 Bathroom Shower/Tub: Health visitor: Standard Bathroom Accessibility: Yes How Accessible: Accessible via walker Home Adaptive Equipment:  Walker - rolling;Shower chair without back;Bedside commode/3-in-1 Prior Function  Level of Independence: Independent with homemaking with ambulation Driving: Yes Vocation: Retired ADL ADL Eating/Feeding: Simulated;Independent Where Assessed - Eating/Feeding: Chair Grooming: Simulated;Set up Where Assessed - Grooming: Sitting, chair Upper Body Bathing: Simulated;Supervision/safety Where Assessed - Upper Body Bathing: Sitting, chair Lower Body Bathing: Not assessed Upper Body Dressing: Not assessed Lower Body Dressing: Not assessed Toilet Transfer: Not assessed Toilet Transfer Method: Not assessed Toileting - Clothing Manipulation: Not assessed Where Assessed - Toileting Clothing Manipulation: Not assessed Where Assessed - Toileting Hygiene: Not assessed Tub/Shower Transfer: Not assessed Tub/Shower Transfer Method: Not assessed Equipment Used: Reacher;Sock aid ADL Comments: Pt with very little participation this am secondary to being frustrated about her insulin not being given.  Began education to pt and provided handout on AE needed to maintain back precautions and perform B/D tasks.  Also provided handout with visual reference for  AE.  Pt currently needs min to mod assist to utilize reacher for attemptin to doff socks.  She reports that her daughter will be available to assist initially. Vision/Perception  Vision - History Baseline Vision: No visual deficits Perception Perception: Within Functional Limits Praxis Praxis: Intact Cognition Cognition Arousal/Alertness: Awake/alert Overall Cognitive Status: Appears within functional limits for tasks assessed Orientation Level: Oriented X4 Sensation/Coordination Sensation Light Touch: Appears Intact Coordination Gross Motor Movements are Fluid and Coordinated: Yes Fine Motor Movements are Fluid and Coordinated: Yes Extremity Assessment RUE Assessment RUE Assessment: Within Functional Limits LUE Assessment LUE Assessment:  Within Functional Limits Mobility    Exercises   End of Session OT - End of Session Activity Tolerance: Patient tolerated treatment well Patient left: in chair;with call bell in reach General Behavior During Session: [facility] for tasks performed Cognition: [facility] for tasks performed   Jenavee Laguardia OTR/L 03/21/2011, 1:33 PM  Pager number [phone]

## 2011-03-21 NOTE — Progress Notes (Signed)
Physical Therapy Treatment Patient Details Name: [name] MRN: [id number] DOB: Aug 31, 1940 Today's Date: 03/21/2011  PT Assessment/Plan  PT - Assessment/Plan PT Plan: Discharge plan remains appropriate PT Frequency: Min 5X/week Follow Up Recommendations: Home health PT;Skilled nursing facility (depending on progress (set by evaluating PT, 03/20/11)) Equipment Recommended: None recommended by OT PT Goals  Acute Rehab PT Goals PT Transfer Goal: Sit to Stand/Stand to Sit - Progress: Other (comment) (not progressing- increased (A) today) PT Goal: Ambulate - Progress: Progressing toward goal Additional Goals Additional Goal #1:  (progressing)  PT Treatment Precautions/Restrictions  Precautions Precautions: Back Precaution Comments: Pt unable to verbalize any of the back precautions at beginning of session.  Reoriented pt on all 3 precautions.  Pt able to recall 2/3 precautions at end of session (no bending/no arching) Required Braces or Orthoses: Yes Spinal Brace: Lumbar corset;Applied in sitting position Restrictions Weight Bearing Restrictions: No Mobility (including Balance) Bed Mobility Bed Mobility: No Transfers Sit to Stand: Other (comment);With armrests;From chair/3-in-1 (Min Guard A) Stand to Sit: Other (comment);With upper extremity assist;To chair/3-in-1 (Min Guard A) Ambulation/Gait Ambulation/Gait: Yes Ambulation/Gait Assistance: Other (comment) (Min Guard A) Ambulation Distance (Feet): 50 Feet Assistive device: Rolling walker Gait Pattern: Antalgic;Decreased stride length;Decreased stance time - right Stairs: No Wheelchair Mobility Wheelchair Mobility: No    Exercise    End of Session PT - End of Session Equipment Utilized During Treatment: Gait belt;Back brace Activity Tolerance: Patient limited by pain Patient left: in chair General Behavior During Session: [facility] for tasks performed Cognition: [facility] for tasks performed  Lara Mulch 03/21/2011, 3:31 PM [phone]

## 2011-03-21 NOTE — Consult Note (Signed)
Physical Medicine and Rehabilitation Consult Reason for Consult:lumbar radiculopathy Referring Phsyician: Dr.Elsner [name] is an 70 y.o. female.   HPI: 70 year-old female admitted November 27 with increased low back pain. Patient with recent lumbar L5-sacral S1 decompression 2 months ago. Recent CT myelogram demonstrated severe stenosis at lumbar L5 and sacral S1 with lumbar radiculopathy. Patient underwent bilateral laminotomies and decompression on November 27 Dr. Danielle Dess. She is fitted with a back corset that she can apply was sitting at the edge the bed. Physical therapy evaluation completed November 28 as she was supervision for left side-lying and also supervision transfers however angulation was not yet tested as corset had not yet arrived. Physical medicine and rehabilitation was consulted at the request of physical therapy to consider inpatient rehabilitation services.  Review of Systems  Constitutional: Positive for malaise/fatigue.  Eyes: Negative for double vision.  Respiratory: Negative for cough and shortness of breath.   Cardiovascular: Negative for chest pain.  Gastrointestinal: Positive for constipation. Negative for nausea.  Genitourinary: Negative for urgency.  Musculoskeletal: Positive for back pain.  Skin: Negative.   Neurological: Negative for dizziness and headaches.  Psychiatric/Behavioral: Negative.    Past Medical History  Diagnosis Date  . Complication of anesthesia     wakes up shaking  . Diabetes mellitus   . Heart murmur     sees Dr. Shana Chute, last seen- early 2012  . Asthma     has used inhaler in past for asthmatic bronchitis, last time- early 2012  . Sleep apnea     borderline sleep apnea, states she no longer uses, early 2012- stopped using   . GERD (gastroesophageal reflux disease)     occas. use of  Prilosec  . Neuromuscular disorder     lumbar radiculopathy, lumbago  . Fibromyalgia   . Arthritis   . Hypertension     02/2010- stress test  /w PCP   Past Surgical History  Procedure Date  . Back surgery     2012  . Abdominal hysterectomy   . Adb.cyst     ovarian cyst  . Eye surgery     macular degeneration treatment - injections   Family History  Problem Relation Age of Onset  . Anesthesia problems Neg Hx   . Hypotension Neg Hx   . Malignant hyperthermia Neg Hx   . Pseudochol deficiency Neg Hx    Social History:  reports that she quit smoking about 12 years ago. She does not have any smokeless tobacco history on file. She reports that she drinks alcohol. She reports that she does not use illicit drugs. Allergies:  Allergies  Allergen Reactions  . Adhesive (Tape) Rash   Medications Prior to Admission  Medication Dose Route Frequency Provider Last Rate Last Dose  . 0.9 %  sodium chloride infusion  250 mL Intravenous PRN Stefani Dama      . 0.9 %  sodium chloride infusion   Intravenous Continuous Shary Key Elsner 100 mL/hr at 03/20/11 0818    . acetaminophen (TYLENOL) tablet 650 mg  650 mg Oral Q4H PRN Shary Key Elsner   650 mg at 03/20/11 2018   Or  . acetaminophen (TYLENOL) suppository 650 mg  650 mg Rectal Q4H PRN Stefani Dama      . alum & mag hydroxide-simeth (MAALOX PLUS) 400-400-40 MG/5ML suspension 30 mL  30 mL Oral Q6H PRN Stefani Dama      . diphenhydrAMINE (BENADRYL) injection 12.5 mg  12.5 mg Intravenous Q6H PRN  Shary Key Elsner       Or  . diphenhydrAMINE (BENADRYL) 12.5 MG/5ML elixir 12.5 mg  12.5 mg Oral Q6H PRN Stefani Dama      . docusate sodium (COLACE) capsule 100 mg  100 mg Oral BID Shary Key Elsner   100 mg at 03/20/11 2305  . menthol-cetylpyridinium (CEPACOL) lozenge 3 mg  1 lozenge Oral PRN Shary Key Elsner       Or  . phenol (CHLORASEPTIC) mouth spray 1 spray  1 spray Mouth/Throat PRN Stefani Dama      . methocarbamol (ROBAXIN) tablet 500 mg  500 mg Oral Q6H PRN Shary Key Elsner   500 mg at 03/21/11 0011   Or  . methocarbamol (ROBAXIN) 500 mg in dextrose 5 % 50 mL IVPB  500 mg Intravenous Q6H  PRN Stefani Dama      . morphine 1 MG/ML PCA injection   Intravenous Q4H Henry J Elsner   6 mg at 03/20/11 1139  . morphine 1 MG/ML PCA injection           . naloxone [facility]) injection 0.4 mg  0.4 mg Intravenous PRN Shary Key Elsner       And  . sodium chloride 0.9 % injection 9 mL  9 mL Intravenous PRN Stefani Dama      . ondansetron (ZOFRAN) injection 4 mg  4 mg Intravenous Q4H PRN Stefani Dama      . ondansetron (ZOFRAN) injection 4 mg  4 mg Intravenous Q6H PRN Stefani Dama      . sodium chloride 0.9 % injection 3 mL  3 mL Intravenous Q12H Henry J Elsner   3 mL at 03/19/11 2234  . sodium chloride 0.9 % injection 3 mL  3 mL Intravenous PRN Stefani Dama      . DISCONTD: bacitracin 50,000 Units in sodium chloride irrigation 0.9 % 500 mL irrigation    PRN Stefani Dama      . DISCONTD: bupivacaine (MARCAINE) 0.5 % injection    PRN Shary Key Elsner   30 mL at 03/19/11 1225  . DISCONTD: hemostatic agents    PRN Shary Key Elsner   1 application at 03/19/11 1226  . DISCONTD: HYDROmorphone (DILAUDID) injection 0.25-0.5 mg  0.25-0.5 mg Intravenous Q5 min PRN Kerby Nora, MD   0.5 mg at 03/19/11 1726  . DISCONTD: HYDROmorphone (DILAUDID) injection 0.25-0.5 mg  0.25-0.5 mg Intravenous Q5 min PRN Kerby Nora, MD      . DISCONTD: HYDROmorphone (DILAUDID) injection 0.25-0.5 mg  0.25-0.5 mg Intravenous Q5 min PRN Kerby Nora, MD      . DISCONTD: lidocaine-EPINEPHrine (XYLOCAINE W/EPI) 1 %-1:100000 (with pres) injection    PRN Shary Key Elsner   30 mL at 03/19/11 1227  . DISCONTD: meperidine (DEMEROL) injection 6.25-12.5 mg  6.25-12.5 mg Intravenous Q5 min PRN Kerby Nora, MD      . DISCONTD: meperidine (DEMEROL) injection 6.25-12.5 mg  6.25-12.5 mg Intravenous Q5 min PRN Kerby Nora, MD      . DISCONTD: meperidine (DEMEROL) injection 6.25-12.5 mg  6.25-12.5 mg Intravenous Q5 min PRN Kerby Nora, MD      . DISCONTD: morphine 2 MG/ML injection 0.05 mg/kg  0.05 mg/kg Intravenous Q10 min PRN Kerby Nora, MD      . DISCONTD: morphine 2 MG/ML injection 0.05 mg/kg  0.05 mg/kg Intravenous Q10 min PRN Kerby Nora, MD      . DISCONTD:  morphine 2 MG/ML injection 0.05 mg/kg  0.05 mg/kg Intravenous Q10 min PRN Kerby Nora, MD      . DISCONTD: ondansetron [facility]) injection 4 mg  4 mg Intravenous Once PRN Kerby Nora, MD      . DISCONTD: ondansetron [facility]) injection 4 mg  4 mg Intravenous Once PRN Kerby Nora, MD      . DISCONTD: ondansetron [facility]) injection 4 mg  4 mg Intravenous Once PRN Kerby Nora, MD      . DISCONTD: sodium chloride irrigation 0.9 %    PRN Shary Key Elsner   1,000 mL at 03/19/11 1224  . DISCONTD: thrombin spray    PRN Shary Key Elsner   20,000 Units at 03/19/11 1227   No current outpatient prescriptions on file as of 03/21/2011.    Home: Home Living Lives With: Alone Type of Home: House Home Layout: Two level;Able to live on main level with bedroom/bathroom Alternate Level Stairs-Number of Steps: 1 flight Home Access: Stairs to enter Entrance Stairs-Rails:  (1 rail- pt unsure which side) Entrance Stairs-Number of Steps: 2 Home Adaptive Equipment: Walker - rolling  Functional History: Prior Function Level of Independence: Independent with homemaking with ambulation Driving: Yes Vocation: Retired Functional Status:  Mobility: Bed Mobility Bed Mobility: Yes Rolling Left: 5: Supervision Rolling Left Details (indicate cue type and reason): cues for log roll technique, increased time Left Sidelying to Sit: 5: Supervision Left Sidelying to Sit Details (indicate cue type and reason): cues for technique and increased time. Took pt approx. 40 minutes to sit EOB. Sitting - Scoot to Edge of Bed: 5: Supervision Sitting - Scoot to [place] of Bed Details (indicate cue type and reason): demo cues for technique.  Transfers Transfers: Yes Stand Pivot Transfers: 5: Supervision Ambulation/Gait Ambulation/Gait: No (Corset has not arrived)    ADL:     Cognition: Cognition Arousal/Alertness: Awake/alert Orientation Level: Oriented X4 Cognition Arousal/Alertness: Awake/alert Overall Cognitive Status: Appears within functional limits for tasks assessed Orientation Level: Oriented X4  Blood pressure 149/82, pulse 60, temperature 98.2 F (36.8 C), temperature source Oral, resp. rate 24, height 5\' 4"  (1.626 m), weight 69.4 kg (153 lb), SpO2 96.00%. Physical Exam  Constitutional: She is oriented to person, place, and time. She appears well-developed.  HENT:  Head: Normocephalic.  Neck: Normal range of motion. Neck supple. No thyromegaly present.  Cardiovascular: Normal rate.   Pulmonary/Chest: Effort normal. She has no wheezes.  Abdominal: She exhibits no distension. There is no tenderness.  Musculoskeletal: She exhibits no edema.  Neurological: She is alert and oriented to person, place, and time. No sensory deficit.  Reflex Scores:      Tricep reflexes are 2+ on the right side and 2+ on the left side.      Bicep reflexes are 2+ on the right side and 2+ on the left side.      Brachioradialis reflexes are 2+ on the right side and 2+ on the left side.      Patellar reflexes are 1+ on the right side and 1+ on the left side.      Achilles reflexes are 1+ on the right side and 1+ on the left side.      No focal weakness in the lower extremities. She does have some pain in addition in the proximal legs and into the knees. No sensory findings were seen  Skin:       Back incision dressed  Psychiatric: She has a normal mood and affect.  Results for orders placed during the hospital encounter of 03/19/11 (from the past 24 hour(s))  GLUCOSE, CAPILLARY     Status: Abnormal   Collection Time   03/20/11 11:36 AM      Component Value Range   Glucose-Capillary 270 (*) 70 - 99 (mg/dL)  GLUCOSE, CAPILLARY     Status: Abnormal   Collection Time   03/20/11  4:53 PM      Component Value Range   Glucose-Capillary 176 (*) 70 - 99 (mg/dL)   GLUCOSE, CAPILLARY     Status: Abnormal   Collection Time   03/20/11 11:18 PM      Component Value Range   Glucose-Capillary 219 (*) 70 - 99 (mg/dL)  GLUCOSE, CAPILLARY     Status: Abnormal   Collection Time   03/21/11  4:55 AM      Component Value Range   Glucose-Capillary 165 (*) 70 - 99 (mg/dL)  GLUCOSE, CAPILLARY     Status: Abnormal   Collection Time   03/21/11  6:18 AM      Component Value Range   Glucose-Capillary 216 (*) 70 - 99 (mg/dL)   Dg Lumbar Spine 2-3 Views  03/19/2011  *RADIOLOGY REPORT*  Clinical Data: L5-S1 fusion.  LUMBAR SPINE - 2-3 VIEW  Comparison: Previous examinations, including earlier today.  Findings: Frontal and lateral C-arm views of the lumbosacral region demonstrate interval interbody bone plug and pedicle screw and rod fixation at the L5-S1 level.  The bony margins are difficult to visualize in that region on the lateral view.  There is grossly normal alignment.  IMPRESSION: Interbody bone plug and hardware fusion at the L5-S1 level with normal alignment.  Original Report Authenticated By: Darrol Angel, M.D.   Dg Spine Portable 1 View  03/19/2011  *RADIOLOGY REPORT*  Clinical Data: L5-S1 fusion.  DG SPINE PORTABLE - 1 VIEW  Comparison: 03/01/2011 and lumbar spine MR dated 11/19/2010.  Findings: There are no levels labeled on the previous lumbar spine radiographs obtained at [facility] on 03/01/2011.  On the MR dated 11/19/2010, the last open disc space is labeled the L5-S1 level.  Therefore, the last open disc space on the current images will be labeled accordingly.  A portable cross-table lateral view of the lumbar spine obtained at 1425 hours demonstrates a metallic localizer with its tip projected posterior to the L5-S1 disc space.  There is stable grade 1 anterolisthesis at that level.  A second localizer is demonstrated with its tip projected over the facets at the L5 level.  There is moderate to marked disc space narrowing at  the L5-S1 level with a vacuum phenomenon.  Mild anterior spur formation at other levels.  IMPRESSION: Localizers at the L5 and L5-S1 levels, as described above.  Original Report Authenticated By: Darrol Angel, M.D.   Dg C-arm 1-60 Min  03/19/2011  CLINICAL DATA: L5-S1 PLIF   C-ARM 1-60 MINUTES  Fluoroscopy was utilized by the requesting physician.  No radiographic  interpretation.      Assessment/Plan: Diagnosis: Lumbar radiculopathy 1. Does the need for close, 24 hr/day medical supervision in concert with the patient's rehab needs make it unreasonable for this patient to be served in a less intensive setting? No 2. Co-Morbidities requiring supervision/potential complications: See above 3. Due to bladder management and bowel management, does the patient require 24 hr/day rehab nursing? No 4. Does the patient require coordinated care of a physician, rehab nurse, PT and OT to address physical and functional deficits  in the context of the above medical diagnosis(es)? No Addressing deficits in the following areas: Not applicable 5. Can the patient actively participate in an intensive therapy program of at least 3 hrs of therapy per day at least 5 days per week? Yes 6. The potential for patient to make measurable gains while on inpatient rehab is Not applicable 7. Anticipated functional outcomes upon discharge from inpatients are: Not applicable 8. Estimated rehab length of stay to reach the above functional goals is: Not applicable 9. Does the patient have adequate social supports to accommodate these discharge functional goals? Not applicable 10. Anticipated D/C setting: Home 11. Anticipated post D/C treatments: HH therapy 12. Overall Rehab/Functional Prognosis: excellent  RECOMMENDATIONS: This patient's condition is appropriate for continued rehabilitative care in the following setting: SNF Patient has agreed to participate in recommended program. Yes Note that insurance prior authorization  may be required for reimbursement for recommended care.  Comment: Patient does not meet medical or functional criteria for inpatient rehabilitation.   03/21/2011

## 2011-03-21 NOTE — Progress Notes (Signed)
Inpatient Diabetes Program Recommendations  AACE/ADA: New Consensus Statement on Inpatient Glycemic Control (2009)  Target Ranges:  Prepandial:   less than 140 mg/dL      Peak postprandial:   less than 180 mg/dL (1-2 hours)      Critically ill patients:  140 - 180 mg/dL   Reason for Visit: Hyperglycemia  Inpatient Diabetes Program Recommendations HgbA1C: Need HgbA1C to assess glycemic control  Note: Add Novolog sensitive tidwc.

## 2011-03-21 NOTE — Progress Notes (Signed)
CSW met with pt to address consult. For assessment, please see pt's chart. CSW will continue to follow for discharge needs.   Dede Query, MSW, Theresia Majors [phone]

## 2011-03-22 LAB — GLUCOSE, CAPILLARY: Glucose-Capillary: 180 mg/dL — ABNORMAL HIGH (ref 70–99)

## 2011-03-22 MED ORDER — HYDROCODONE-ACETAMINOPHEN 5-325 MG PO TABS
1.0000 | ORAL_TABLET | ORAL | Status: DC | PRN
  Administered 2011-03-25 (×2): 2 via ORAL
  Filled 2011-03-22 (×2): qty 2

## 2011-03-22 NOTE — Progress Notes (Signed)
Evaluated for possible admission.  Please see rehab consult done 11/29 by Dr. Riley Kill.  Patient is doing too well to meet criteria for acute inpatient rehab stay.  Options will be SNF or home with [facility] therapies depending on progress.  Pager [phone]

## 2011-03-22 NOTE — Progress Notes (Deleted)
Occupational Therapy Evaluation Patient Details Name: [name] MRN: [id number] DOB: Dec 25, 1940 Today's Date: 03/22/2011 10:05-10:27   1sc OT Assessment/Plan OT Assessment/Plan Comments on Treatment Session: Pt doing better. Able to state 2/3 back precautions.  Began utilization of AE needed to adhere to her back precautions.  Recommend short term SNF to reach modified independent level for all ADLs including meal prep.  Pt still at an overall min quard assist level for transfers and selfcare. OT Plan: Discharge plan needs to be updated Follow Up Recommendations: Skilled nursing facility Equipment Recommended: None recommended by OT OT Goals ADL Goals ADL Goal: Lower Body Bathing - Progress: Progressing toward goals ADL Goal: Lower Body Dressing - Progress: Met ADL Goal: Toilet Transfer - Progress: Met ADL Goal: Toileting - Clothing Manipulation - Progress: Met ADL Goal: Toileting - Hygiene - Progress: Met ADL Goal: Tub/Shower Transfer - Progress: Met  OT Treatment Precautions/Restrictions  Precautions Precautions: Back Required Braces or Orthoses: Yes Spinal Brace: Lumbar corset;Applied in sitting position Restrictions Weight Bearing Restrictions: No   ADL ADL Where Assessed - Upper Body Dressing: Sitting, chair Lower Body Dressing: Performed;Supervision/safety Lower Body Dressing Details (indicate cue type and reason): Pt able to cross LEs to donn left sock one handed.  Needed use of sockaide for donning right sock. Where Assessed - Lower Body Dressing: Sit to stand from chair Toilet Transfer: Performed;Supervision/safety Toilet Transfer Method: Proofreader: Raised toilet seat with arms (or 3-in-1 over toilet) Toileting - Clothing Manipulation: Simulated;Supervision/safety Where Assessed - Toileting Clothing Manipulation: Sit to stand from 3-in-1 or toilet Toileting - Hygiene: Simulated;Supervision/safety Where Assessed - Toileting Hygiene:  Sit to stand from 3-in-1 or toilet Tub/Shower Transfer: Performed;Supervision/safety Tub/Shower Transfer Method: Science writer: Walk in shower Equipment Used: Reacher;Sock aid (rolling walker) ADL Comments: Pt feeling much better today.  Discussed donning/doffing brace while sitting in chair.  Initially brace was positioned too high so explained that it needed to be lower on her back.  Pt is able to donn and doff with setup.  She expressed the desire to go to SNF for a week or so of rehab.  Feel this is probably a good decision since her daughter cannot assist her with ADL's.   Mobility  Transfers Sit to Stand: 5: Supervision;From chair/3-in-1;From toilet;With armrests;With upper extremity assist Exercises    End of Session OT - End of Session Equipment Utilized During Treatment: Other (comment) (Lumbar corsette) Activity Tolerance: Patient tolerated treatment well Patient left: in chair;with family/visitor present General Behavior During Session: [facility] for tasks performed Cognition: [facility] for tasks performed  Takita Riecke OTR/L 03/22/2011, 10:45 AM Pager number [phone]

## 2011-03-22 NOTE — Progress Notes (Signed)
Subjective: Patient reports no complaints, feeling better  Objective: Vital signs in last 24 hours: Temp:  [98.6 F (37 C)-99 F (37.2 C)] 98.8 F (37.1 C) (11/30 1400) Pulse Rate:  [77-81] 77  (11/30 1400) Resp:  [18-20] 18  (11/30 1400) BP: (116-155)/(68-85) 116/68 mmHg (11/30 1400) SpO2:  [92 %-98 %] 98 % (11/30 1400)  Intake/Output from previous day:   Intake/Output this shift:    motor strength improving, ambulation improving.  Lab Results:  [facility] 03/20/11 0510  WBC 6.8  HGB 10.6*  HCT 32.0*  PLT 167   BMET  Basename 03/20/11 0510  NA 141  K 3.5  CL 104  CO2 31  GLUCOSE 151*  BUN 13  CREATININE 0.80  CALCIUM 9.0    Studies/Results: No results found.  Assessment/Plan: Slow improvement  LOS: 3 days  Convalescence center   Mallory Enriques J 03/22/2011, 6:17 PM

## 2011-03-22 NOTE — Progress Notes (Signed)
Occupational Therapy Treatment Note Patient Details Name: [name] MRN: [id number] DOB: 1941-04-16 Today's Date: 03/22/2011 10:05-10:27    1sc OT Assessment/Plan OT Assessment/Plan Comments on Treatment Session: Pt doing better. Able to state 2/3 back precautions.  Began utilization of AE needed to adhere to her back precautions.  Recommend short term SNF to reach modified independent level for all ADLs including meal prep.  Pt still at an overall min quard assist level for transfers and selfcare. OT Plan: Discharge plan needs to be updated Follow Up Recommendations: Skilled nursing facility Equipment Recommended: None recommended by OT OT Goals ADL Goals Pt Will Perform Lower Body Bathing: with modified independence;with adaptive equipment;Sit to stand from chair;Sit to stand from bed ADL Goal: Lower Body Bathing - Progress: Revised (due to goal met) Pt Will Perform Lower Body Dressing: with modified independence;with adaptive equipment;Sit to stand from chair;Sit to stand from bed ADL Goal: Lower Body Dressing - Progress: Revised ( due to goal met) Pt Will Transfer to Toilet: with modified independence;Ambulation;with DME;3-in-1 (Pt met previous goal.) ADL Goal: Toilet Transfer - Progress: Revised ( due to goal met) Pt Will Perform Toileting - Clothing Manipulation: with modified independence (Pt met previous goal.) ADL Goal: Toileting - Clothing Manipulation - Progress: Revised ( due to goal met) Pt Will Perform Toileting - Hygiene: with modified independence;Sit to stand from 3-in-1/toilet (Pt met previous goal.) ADL Goal: Toileting - Hygiene - Progress: Revised (modified due to lack of progress/goal met) ADL Goal: Tub/Shower Transfer - Progress: Met  OT Treatment Precautions/Restrictions  Precautions Precautions: Back Required Braces or Orthoses: Yes Spinal Brace: Lumbar corset;Applied in sitting position Restrictions Weight Bearing Restrictions: No   ADL ADL Where  Assessed - Upper Body Dressing: Sitting, chair Lower Body Dressing: Performed;Supervision/safety Lower Body Dressing Details (indicate cue type and reason): Pt able to cross LEs to donn left sock one handed.  Needed use of sockaide for donning right sock. Where Assessed - Lower Body Dressing: Sit to stand from chair Toilet Transfer: Performed;Supervision/safety Toilet Transfer Method: Proofreader: Raised toilet seat with arms (or 3-in-1 over toilet) Toileting - Clothing Manipulation: Simulated;Supervision/safety Where Assessed - Toileting Clothing Manipulation: Sit to stand from 3-in-1 or toilet Toileting - Hygiene: Simulated;Supervision/safety Where Assessed - Toileting Hygiene: Sit to stand from 3-in-1 or toilet Tub/Shower Transfer: Performed;Supervision/safety Tub/Shower Transfer Method: Science writer: Walk in shower Equipment Used: Reacher;Sock aid (rolling walker) ADL Comments: Pt feeling much better today.  Discussed donning/doffing brace while sitting in chair.  Initially brace was positioned too high so explained that it needed to be lower on her back.  Pt is able to donn and doff with setup.  She expressed the desire to go to SNF for a week or so of rehab.  Feel this is probably a good decision since her daughter cannot assist her with ADL's.   Mobility  Transfers Sit to Stand: 5: Supervision;From chair/3-in-1;From toilet;With armrests;With upper extremity assist Exercises    End of Session OT - End of Session Equipment Utilized During Treatment: Other (comment) (Lumbar corsette) Activity Tolerance: Patient tolerated treatment well Patient left: in chair;with family/visitor present General Behavior During Session: [facility] for tasks performed Cognition: [facility] for tasks performed  Ardys Hataway OTR/L 03/22/2011, 10:52 AM Pager number [phone]

## 2011-03-22 NOTE — Progress Notes (Signed)
Physical Therapy Treatment Patient Details Name: [name] MRN: [id number] DOB: 05-29-1940 Today's Date: 03/22/2011  PT Assessment/Plan  PT - Assessment/Plan Comments on Treatment Session: Pt states she feels better today, but still having RLE pain.  No c/o of back pain.  Discussed with pt. RE: d/c plans & pt wishes to go to [facility] prior to d/c home to increase (I), strength, safety.  Pt states she will not have anyone to provide (A).  D/C plan updated.  PT Plan: Discharge plan needs to be updated PT Frequency: Min 5X/week Follow Up Recommendations: Skilled nursing facility Equipment Recommended: Defer to next venue PT Goals  Acute Rehab PT Goals PT Goal: Supine/Side to Sit - Progress: Met PT Goal: Sit to Supine/Side - Progress: Met PT Transfer Goal: Sit to Stand/Stand to Sit - Progress: Progressing toward goal PT Goal: Ambulate - Progress: Progressing toward goal  PT Treatment Precautions/Restrictions  Precautions Precautions: Back Precaution Comments: Pt unable to verbalize any of the back precautions at beginning of session.  Reoriented pt on all 3 precautions.  Pt able to recall 2/3 precautions at end of session (no bending/no arching) Required Braces or Orthoses: Yes Spinal Brace: Lumbar corset;Applied in sitting position Restrictions Weight Bearing Restrictions: No Mobility (including Balance) Bed Mobility Rolling Left: 6: Modified independent (Device/Increase time) Left Sidelying to Sit: 6: Modified independent (Device/Increase time);HOB flat Sitting - Scoot to Edge of Bed: 6: Modified independent (Device/Increase time) Sit to Supine - Left: 6: Modified independent (Device/Increase time);HOB flat Transfers Sit to Stand: 5: Supervision;From bed;From chair/3-in-1;With upper extremity assist Sit to Stand Details (indicate cue type and reason): cues for safe hand placement Stand to Sit: 5: Supervision;To chair/3-in-1;To bed Stand to Sit Details: cues for safe hand  placement Ambulation/Gait Ambulation/Gait Assistance: 5: Supervision Ambulation/Gait Assistance Details (indicate cue type and reason): (S) for safety due to RLE weakness/pain.  Pt states pain decreases with ambulation but that RLE feels like it could give out on her.   Ambulation Distance (Feet): 200 Feet Assistive device: Rolling walker Gait Pattern: Step-through pattern    Exercise    End of Session PT - End of Session Equipment Utilized During Treatment: Gait belt;Back brace Activity Tolerance: Patient tolerated treatment well Patient left: in chair;with call bell in reach General Behavior During Session: [facility] for tasks performed Cognition: [facility] for tasks performed  Lara Mulch 03/22/2011, 11:12 AM [phone]  [facility] [phone] (pager)

## 2011-03-23 LAB — GLUCOSE, CAPILLARY: Glucose-Capillary: 151 mg/dL — ABNORMAL HIGH (ref 70–99)

## 2011-03-23 NOTE — Progress Notes (Signed)
Filed Vitals:   03/22/11 1827 03/22/11 2200 03/23/11 0630 03/23/11 1020  BP: 152/81 138/69 149/82 123/78  Pulse: 83 85 78 81  Temp: 98.5 F (36.9 C) 98.9 F (37.2 C) 98.4 F (36.9 C) 98.8 F (37.1 C)  TempSrc: Oral Oral Oral Oral  Resp: 18 20 20 18   Height:      Weight:      SpO2: 96% 95% 98% 98%     Patient resting in bed comfortably. She's been undergoing PT and OT and is anticipating transferred to a skilled nursing facility for rehabilitation early next week.   Plan: Continue PT and OT until transferred to skilled nursing facility.

## 2011-03-23 NOTE — Progress Notes (Signed)
Physical Therapy Treatment Patient Details Name: [name] MRN: [id number] DOB: April 08, 1941 Today's Date: 03/23/2011  PT Assessment/Plan  PT - Assessment/Plan Comments on Treatment Session: Patient with signficant improvements in function - desires SNF placement secondary to decreased caregiver support. PT Plan: Discharge plan remains appropriate PT Frequency: Min 3X/week Follow Up Recommendations: Skilled nursing facility PT Goals  Acute Rehab PT Goals PT Goal: Ambulate - Progress: Progressing toward goal  PT Treatment Precautions/Restrictions  Precautions Precautions: Back Precaution Comments: Patient continues to have difficulty recalling specific back precautions, but demonstrates and verbalizes correct techniques with self care activities Required Braces or Orthoses: Yes Spinal Brace: Lumbar corset;Applied in sitting position Restrictions Weight Bearing Restrictions: No Mobility (including Balance) Transfers Sit to Stand: 6: Modified independent (Device/Increase time);From chair/3-in-1;From toilet;With armrests Stand to Sit: 6: Modified independent (Device/Increase time);To chair/3-in-1;To toilet Ambulation/Gait Ambulation/Gait Assistance: 5: Supervision Ambulation/Gait Assistance Details (indicate cue type and reason): Improved safety with gait and no right lower extremity concerns. Patient maintains safe distance from rolling walker and with good posture. Ambulation Distance (Feet): 240 Feet Assistive device: Rolling walker Gait Pattern: Step-through pattern Gait velocity: Verbal cues for incresed efficiency  Static Sitting Balance Static Sitting - Balance Support: No upper extremity supported Static Sitting - Level of Assistance: 5: Stand by assistance (During functional ADL tasks at sink) Patient able to sit and don bra, gown, and brace independently. Needed assistance with donning underwear initially, but able to manage with toileting independently including  independent with toilet hygiene. Stand by assistance with grooming secondary to increased postural sway with hands unsupported. End of Session PT - End of Session Equipment Utilized During Treatment: Back brace Activity Tolerance: Patient tolerated treatment well Patient left: in chair General Behavior During Session: [facility] for tasks performed Cognition: [facility] for tasks performed  Edwyna Perfect, PT  Pager [phone]  03/23/2011, 2:33 PM

## 2011-03-24 LAB — GLUCOSE, CAPILLARY
Glucose-Capillary: 167 mg/dL — ABNORMAL HIGH (ref 70–99)
Glucose-Capillary: 134 mg/dL — ABNORMAL HIGH (ref 70–99)

## 2011-03-24 NOTE — Progress Notes (Signed)
Filed Vitals:   03/23/11 1440 03/23/11 1728 03/23/11 2200 03/24/11 0640  BP: 135/72 128/78 131/77 130/83  Pulse: 89 86 84 79  Temp: 98.8 F (37.1 C) 98.9 F (37.2 C) 98.7 F (37.1 C) 98.9 F (37.2 C)  TempSrc: Oral Oral Oral Oral  Resp: 18 18 20 20   Height:      Weight:      SpO2: 99% 96% 97% 95%    Patient sitting up in chair in brace. Gradually increasing mobility and activity. Patient is asked about showering, her nurse reports the wound is healing well and was closed with Dermabond, and we will allow her to shower.   Plan: Continue PT and OT until transfer to skilled nursing facility.

## 2011-03-25 LAB — GLUCOSE, CAPILLARY
Glucose-Capillary: 135 mg/dL — ABNORMAL HIGH (ref 70–99)
Glucose-Capillary: 182 mg/dL — ABNORMAL HIGH (ref 70–99)

## 2011-03-25 NOTE — Progress Notes (Signed)
Pt is ready for discharge to [facility] today. Facility has received the discharge summary. Pt and pt's daughter are agreeable to discharge plan. [facility] will provide transportation. CSW signing off, as no further clinical social work needs identified.   Dede Query, MSW, Theresia Majors [phone]

## 2011-03-25 NOTE — Progress Notes (Signed)
CSW met with pt to inform pt of a bed offer from [facility]. CSW confirmed with pt that she has chosen a bed at [facility]. CSW contacted facility and the liason will be meeting with pt to complete ppw. CSW will follow up with MD regarding bed confirmation. CSW will continue to follow.   Dede Query, MSW, Theresia Majors [phone]

## 2011-03-25 NOTE — Discharge Summary (Signed)
Physician Discharge Summary  Patient ID: [name] MRN: [id number] DOB/AGE: 1940/06/24 70 y.o.  Admit date: 03/19/2011 Discharge date: 03/25/2011  Admission Diagnoses: Lumbar spondylolisthesis with radiculopathy status post decompressive laminectomy L5-S1  Discharge Diagnoses: Lumbar spondylolisthesis congenital, with radiculopathy status post decompressive laminectomy L5-S1 . Acute blood loss anemia Principal Problem:  *Spondylolisthesis of lumbar region Active Problems:  Lumbar radicular pain   Discharged Condition: good  Hospital Course: Patient was admitted to undergo surgical decompression and stabilization at L5-S1 with posterior interbody arthrodesis using peek spacers local autograft and allograft and pedicle screw fixation at L5-S1. He tolerated the procedure well. She had evidence of acute blood loss anemia but did not require transfusion. She made progress with physical therapy and independent activity however because the patient lives alone it was decided by the patient with the help of physical therapy that she should have further convalescence in a skilled nursing facility prior to discharge to the house  Consults: Physical therapy, occupational therapy, rehabilitation medicine  Significant Diagnostic Studies: labs: Postoperative hematocrit 32. Reoperative hematocrit 37.5.  Treatments: surgery: Decompression L5-S1 with posterior lumbar interbody arthrodesis using peek spacers local autograft and allograft pedicle screw fixation L5-S1.  Discharge Exam: Blood pressure 105/53, pulse 92, temperature 99.8 F (37.7 C), temperature source Oral, resp. rate 18, height 5\' 4"  (1.626 m), weight 69.4 kg (153 lb), SpO2 98.00%. Incision is clean and dry motor strength is 4/5 in tibialis anterior 4/5 and gastrocnemii and station is intact to pin and light touch.  Disposition: Transfer to skilled nursing facility for further convalescence.   Current Discharge Medication List    CONTINUE these medications which have NOT CHANGED   Details  calcium-vitamin D (OSCAL WITH D) 500-200 MG-UNIT per tablet Take 1 tablet by mouth daily.      Cyanocobalamin (VITAMIN B12 PO) Take 1 tablet by mouth daily.      ezetimibe (ZETIA) 10 MG tablet Take 10 mg by mouth daily.      fluvastatin XL (LESCOL XL) 80 MG 24 hr tablet Take 80 mg by mouth daily.      gabapentin (NEURONTIN) 100 MG capsule Take 100-300 mg by mouth 3 (three) times daily. Pt takes 100mg  twice daily and then 300mg  at bedtime     glimepiride (AMARYL) 2 MG tablet Take 4 mg by mouth daily before breakfast.      HYDROcodone-acetaminophen (NORCO) 10-325 MG per tablet Take 1 tablet by mouth every 6 (six) hours as needed. For pain     insulin lispro (HUMALOG) 100 UNIT/ML injection Inject 3-4 Units into the skin 3 (three) times daily before meals.      omeprazole (PRILOSEC) 10 MG capsule Take 10 mg by mouth daily.      potassium chloride SA (K-DUR,KLOR-CON) 20 MEQ tablet Take 20 mEq by mouth daily.      pregabalin (LYRICA) 50 MG capsule Take 50 mg by mouth at bedtime.      Pyridoxine HCl (VITAMIN B-6 PO) Take 1 tablet by mouth daily.      saxagliptin HCl (ONGLYZA) 2.5 MG TABS tablet Take 5 mg by mouth daily.      valsartan-hydrochlorothiazide (DIOVAN-HCT) 320-12.5 MG per tablet Take 1 tablet by mouth daily.      zolpidem (AMBIEN) 10 MG tablet Take 5 mg by mouth at bedtime as needed. For sleep       STOP taking these medications     alendronate (FOSAMAX) 70 MG tablet      meloxicam (MOBIC) 7.5 MG tablet  SignedStefani Dama 03/25/2011, 11:47 AM

## 2011-03-25 NOTE — Progress Notes (Signed)
Pt. Discharge instructions provided. Pt verbalized understanding. Pt prescription provided in yellow folder with transport.  Pt under no s/s distress.

## 2011-05-01 DIAGNOSIS — H353290 Exudative age-related macular degeneration, unspecified eye, stage unspecified: Secondary | ICD-10-CM | POA: Diagnosis not present

## 2011-05-01 DIAGNOSIS — H35059 Retinal neovascularization, unspecified, unspecified eye: Secondary | ICD-10-CM | POA: Diagnosis not present

## 2011-05-03 DIAGNOSIS — M25539 Pain in unspecified wrist: Secondary | ICD-10-CM | POA: Diagnosis not present

## 2011-05-16 DIAGNOSIS — Z1231 Encounter for screening mammogram for malignant neoplasm of breast: Secondary | ICD-10-CM | POA: Diagnosis not present

## 2011-05-24 DIAGNOSIS — IMO0001 Reserved for inherently not codable concepts without codable children: Secondary | ICD-10-CM | POA: Diagnosis not present

## 2011-05-28 DIAGNOSIS — IMO0001 Reserved for inherently not codable concepts without codable children: Secondary | ICD-10-CM | POA: Diagnosis not present

## 2011-05-28 DIAGNOSIS — E785 Hyperlipidemia, unspecified: Secondary | ICD-10-CM | POA: Diagnosis not present

## 2011-06-05 DIAGNOSIS — H35059 Retinal neovascularization, unspecified, unspecified eye: Secondary | ICD-10-CM | POA: Diagnosis not present

## 2011-06-05 DIAGNOSIS — H353290 Exudative age-related macular degeneration, unspecified eye, stage unspecified: Secondary | ICD-10-CM | POA: Diagnosis not present

## 2011-06-06 DIAGNOSIS — M545 Low back pain, unspecified: Secondary | ICD-10-CM | POA: Diagnosis not present

## 2011-06-27 DIAGNOSIS — IMO0001 Reserved for inherently not codable concepts without codable children: Secondary | ICD-10-CM | POA: Diagnosis not present

## 2011-06-27 DIAGNOSIS — M810 Age-related osteoporosis without current pathological fracture: Secondary | ICD-10-CM | POA: Diagnosis not present

## 2011-07-04 DIAGNOSIS — IMO0001 Reserved for inherently not codable concepts without codable children: Secondary | ICD-10-CM | POA: Diagnosis not present

## 2011-07-04 DIAGNOSIS — I10 Essential (primary) hypertension: Secondary | ICD-10-CM | POA: Diagnosis not present

## 2011-07-10 DIAGNOSIS — R42 Dizziness and giddiness: Secondary | ICD-10-CM | POA: Diagnosis not present

## 2011-07-10 DIAGNOSIS — E119 Type 2 diabetes mellitus without complications: Secondary | ICD-10-CM | POA: Diagnosis not present

## 2011-07-10 DIAGNOSIS — I10 Essential (primary) hypertension: Secondary | ICD-10-CM | POA: Diagnosis not present

## 2011-07-17 DIAGNOSIS — H353290 Exudative age-related macular degeneration, unspecified eye, stage unspecified: Secondary | ICD-10-CM | POA: Diagnosis not present

## 2011-07-17 DIAGNOSIS — H35059 Retinal neovascularization, unspecified, unspecified eye: Secondary | ICD-10-CM | POA: Diagnosis not present

## 2011-07-18 DIAGNOSIS — M19049 Primary osteoarthritis, unspecified hand: Secondary | ICD-10-CM | POA: Diagnosis not present

## 2011-07-18 DIAGNOSIS — IMO0002 Reserved for concepts with insufficient information to code with codable children: Secondary | ICD-10-CM | POA: Diagnosis not present

## 2011-07-23 DIAGNOSIS — M62838 Other muscle spasm: Secondary | ICD-10-CM | POA: Diagnosis not present

## 2011-07-23 DIAGNOSIS — G4700 Insomnia, unspecified: Secondary | ICD-10-CM | POA: Diagnosis not present

## 2011-07-23 DIAGNOSIS — M545 Low back pain, unspecified: Secondary | ICD-10-CM | POA: Diagnosis not present

## 2011-07-23 DIAGNOSIS — IMO0001 Reserved for inherently not codable concepts without codable children: Secondary | ICD-10-CM | POA: Diagnosis not present

## 2011-08-06 DIAGNOSIS — M899 Disorder of bone, unspecified: Secondary | ICD-10-CM | POA: Diagnosis not present

## 2011-08-08 DIAGNOSIS — IMO0002 Reserved for concepts with insufficient information to code with codable children: Secondary | ICD-10-CM | POA: Diagnosis not present

## 2011-08-08 DIAGNOSIS — M19049 Primary osteoarthritis, unspecified hand: Secondary | ICD-10-CM | POA: Diagnosis not present

## 2011-08-21 DIAGNOSIS — H353290 Exudative age-related macular degeneration, unspecified eye, stage unspecified: Secondary | ICD-10-CM | POA: Diagnosis not present

## 2011-08-21 DIAGNOSIS — H35059 Retinal neovascularization, unspecified, unspecified eye: Secondary | ICD-10-CM | POA: Diagnosis not present

## 2011-08-29 DIAGNOSIS — M4310 Spondylolisthesis, site unspecified: Secondary | ICD-10-CM | POA: Diagnosis not present

## 2011-09-03 DIAGNOSIS — IMO0001 Reserved for inherently not codable concepts without codable children: Secondary | ICD-10-CM | POA: Diagnosis not present

## 2011-09-05 DIAGNOSIS — E785 Hyperlipidemia, unspecified: Secondary | ICD-10-CM | POA: Diagnosis not present

## 2011-09-05 DIAGNOSIS — IMO0001 Reserved for inherently not codable concepts without codable children: Secondary | ICD-10-CM | POA: Diagnosis not present

## 2011-09-24 DIAGNOSIS — IMO0001 Reserved for inherently not codable concepts without codable children: Secondary | ICD-10-CM | POA: Diagnosis not present

## 2011-09-26 DIAGNOSIS — IMO0001 Reserved for inherently not codable concepts without codable children: Secondary | ICD-10-CM | POA: Diagnosis not present

## 2011-09-26 DIAGNOSIS — E785 Hyperlipidemia, unspecified: Secondary | ICD-10-CM | POA: Diagnosis not present

## 2011-10-02 DIAGNOSIS — H35059 Retinal neovascularization, unspecified, unspecified eye: Secondary | ICD-10-CM | POA: Diagnosis not present

## 2011-10-02 DIAGNOSIS — H353290 Exudative age-related macular degeneration, unspecified eye, stage unspecified: Secondary | ICD-10-CM | POA: Diagnosis not present

## 2011-10-29 DIAGNOSIS — IMO0001 Reserved for inherently not codable concepts without codable children: Secondary | ICD-10-CM | POA: Diagnosis not present

## 2011-10-29 DIAGNOSIS — E785 Hyperlipidemia, unspecified: Secondary | ICD-10-CM | POA: Diagnosis not present

## 2011-10-29 DIAGNOSIS — I10 Essential (primary) hypertension: Secondary | ICD-10-CM | POA: Diagnosis not present

## 2011-11-26 DIAGNOSIS — G4710 Hypersomnia, unspecified: Secondary | ICD-10-CM | POA: Diagnosis not present

## 2011-11-26 DIAGNOSIS — G4730 Sleep apnea, unspecified: Secondary | ICD-10-CM | POA: Diagnosis not present

## 2011-12-04 DIAGNOSIS — H353290 Exudative age-related macular degeneration, unspecified eye, stage unspecified: Secondary | ICD-10-CM | POA: Diagnosis not present

## 2011-12-09 DIAGNOSIS — H2510 Age-related nuclear cataract, unspecified eye: Secondary | ICD-10-CM | POA: Diagnosis not present

## 2011-12-09 DIAGNOSIS — E119 Type 2 diabetes mellitus without complications: Secondary | ICD-10-CM | POA: Diagnosis not present

## 2011-12-09 DIAGNOSIS — H353190 Nonexudative age-related macular degeneration, unspecified eye, stage unspecified: Secondary | ICD-10-CM | POA: Diagnosis not present

## 2011-12-09 DIAGNOSIS — Z794 Long term (current) use of insulin: Secondary | ICD-10-CM | POA: Diagnosis not present

## 2011-12-09 DIAGNOSIS — H26019 Infantile and juvenile cortical, lamellar, or zonular cataract, unspecified eye: Secondary | ICD-10-CM | POA: Diagnosis not present

## 2011-12-09 DIAGNOSIS — Z961 Presence of intraocular lens: Secondary | ICD-10-CM | POA: Diagnosis not present

## 2011-12-27 DIAGNOSIS — M25559 Pain in unspecified hip: Secondary | ICD-10-CM | POA: Diagnosis not present

## 2011-12-27 DIAGNOSIS — M25569 Pain in unspecified knee: Secondary | ICD-10-CM | POA: Diagnosis not present

## 2011-12-27 DIAGNOSIS — M545 Low back pain, unspecified: Secondary | ICD-10-CM | POA: Diagnosis not present

## 2011-12-31 DIAGNOSIS — IMO0001 Reserved for inherently not codable concepts without codable children: Secondary | ICD-10-CM | POA: Diagnosis not present

## 2011-12-31 DIAGNOSIS — E785 Hyperlipidemia, unspecified: Secondary | ICD-10-CM | POA: Diagnosis not present

## 2012-01-07 DIAGNOSIS — IMO0001 Reserved for inherently not codable concepts without codable children: Secondary | ICD-10-CM | POA: Diagnosis not present

## 2012-01-07 DIAGNOSIS — Z23 Encounter for immunization: Secondary | ICD-10-CM | POA: Diagnosis not present

## 2012-01-07 DIAGNOSIS — E785 Hyperlipidemia, unspecified: Secondary | ICD-10-CM | POA: Diagnosis not present

## 2012-01-14 DIAGNOSIS — J45909 Unspecified asthma, uncomplicated: Secondary | ICD-10-CM | POA: Diagnosis not present

## 2012-01-14 DIAGNOSIS — J3089 Other allergic rhinitis: Secondary | ICD-10-CM | POA: Diagnosis not present

## 2012-01-14 DIAGNOSIS — J019 Acute sinusitis, unspecified: Secondary | ICD-10-CM | POA: Diagnosis not present

## 2012-01-14 DIAGNOSIS — J301 Allergic rhinitis due to pollen: Secondary | ICD-10-CM | POA: Diagnosis not present

## 2012-02-04 DIAGNOSIS — H35059 Retinal neovascularization, unspecified, unspecified eye: Secondary | ICD-10-CM | POA: Diagnosis not present

## 2012-02-04 DIAGNOSIS — H353290 Exudative age-related macular degeneration, unspecified eye, stage unspecified: Secondary | ICD-10-CM | POA: Diagnosis not present

## 2012-02-10 DIAGNOSIS — IMO0001 Reserved for inherently not codable concepts without codable children: Secondary | ICD-10-CM | POA: Diagnosis not present

## 2012-02-14 ENCOUNTER — Other Ambulatory Visit: Payer: Self-pay

## 2012-02-14 DIAGNOSIS — D485 Neoplasm of uncertain behavior of skin: Secondary | ICD-10-CM | POA: Diagnosis not present

## 2012-02-14 DIAGNOSIS — L820 Inflamed seborrheic keratosis: Secondary | ICD-10-CM | POA: Diagnosis not present

## 2012-02-26 DIAGNOSIS — G4730 Sleep apnea, unspecified: Secondary | ICD-10-CM | POA: Diagnosis not present

## 2012-02-26 DIAGNOSIS — G4710 Hypersomnia, unspecified: Secondary | ICD-10-CM | POA: Diagnosis not present

## 2012-03-10 DIAGNOSIS — H353290 Exudative age-related macular degeneration, unspecified eye, stage unspecified: Secondary | ICD-10-CM | POA: Diagnosis not present

## 2012-03-10 DIAGNOSIS — H35059 Retinal neovascularization, unspecified, unspecified eye: Secondary | ICD-10-CM | POA: Diagnosis not present

## 2012-03-17 DIAGNOSIS — H1045 Other chronic allergic conjunctivitis: Secondary | ICD-10-CM | POA: Diagnosis not present

## 2012-03-30 DIAGNOSIS — H0019 Chalazion unspecified eye, unspecified eyelid: Secondary | ICD-10-CM | POA: Diagnosis not present

## 2012-04-02 DIAGNOSIS — IMO0001 Reserved for inherently not codable concepts without codable children: Secondary | ICD-10-CM | POA: Diagnosis not present

## 2012-04-02 DIAGNOSIS — E785 Hyperlipidemia, unspecified: Secondary | ICD-10-CM | POA: Diagnosis not present

## 2012-04-07 DIAGNOSIS — E119 Type 2 diabetes mellitus without complications: Secondary | ICD-10-CM | POA: Diagnosis not present

## 2012-04-07 DIAGNOSIS — I10 Essential (primary) hypertension: Secondary | ICD-10-CM | POA: Diagnosis not present

## 2012-04-07 DIAGNOSIS — E785 Hyperlipidemia, unspecified: Secondary | ICD-10-CM | POA: Diagnosis not present

## 2012-04-20 DIAGNOSIS — H353290 Exudative age-related macular degeneration, unspecified eye, stage unspecified: Secondary | ICD-10-CM | POA: Diagnosis not present

## 2012-04-20 DIAGNOSIS — H3570 Unspecified separation of retinal layers: Secondary | ICD-10-CM | POA: Diagnosis not present

## 2012-05-26 DIAGNOSIS — H3570 Unspecified separation of retinal layers: Secondary | ICD-10-CM | POA: Diagnosis not present

## 2012-05-26 DIAGNOSIS — H353290 Exudative age-related macular degeneration, unspecified eye, stage unspecified: Secondary | ICD-10-CM | POA: Diagnosis not present

## 2012-06-01 DIAGNOSIS — G4710 Hypersomnia, unspecified: Secondary | ICD-10-CM | POA: Diagnosis not present

## 2012-06-01 DIAGNOSIS — G4730 Sleep apnea, unspecified: Secondary | ICD-10-CM | POA: Diagnosis not present

## 2012-06-09 DIAGNOSIS — B029 Zoster without complications: Secondary | ICD-10-CM | POA: Diagnosis not present

## 2012-06-15 ENCOUNTER — Other Ambulatory Visit (HOSPITAL_COMMUNITY): Payer: Self-pay | Admitting: Cardiology

## 2012-06-15 DIAGNOSIS — G4730 Sleep apnea, unspecified: Secondary | ICD-10-CM | POA: Diagnosis not present

## 2012-06-15 DIAGNOSIS — R55 Syncope and collapse: Secondary | ICD-10-CM

## 2012-06-15 DIAGNOSIS — G4710 Hypersomnia, unspecified: Secondary | ICD-10-CM | POA: Diagnosis not present

## 2012-06-16 ENCOUNTER — Ambulatory Visit (HOSPITAL_COMMUNITY)
Admission: RE | Admit: 2012-06-16 | Discharge: 2012-06-16 | Disposition: A | Discharge status: Home / Self Care | Payer: Medicare Other | Source: Ambulatory Visit | Attending: Cardiology | Admitting: Cardiology

## 2012-06-16 DIAGNOSIS — R55 Syncope and collapse: Secondary | ICD-10-CM | POA: Diagnosis not present

## 2012-06-16 DIAGNOSIS — R197 Diarrhea, unspecified: Secondary | ICD-10-CM | POA: Diagnosis not present

## 2012-06-16 DIAGNOSIS — G319 Degenerative disease of nervous system, unspecified: Secondary | ICD-10-CM | POA: Insufficient documentation

## 2012-06-18 DIAGNOSIS — Z803 Family history of malignant neoplasm of breast: Secondary | ICD-10-CM | POA: Diagnosis not present

## 2012-06-18 DIAGNOSIS — Z1231 Encounter for screening mammogram for malignant neoplasm of breast: Secondary | ICD-10-CM | POA: Diagnosis not present

## 2012-06-23 DIAGNOSIS — N6009 Solitary cyst of unspecified breast: Secondary | ICD-10-CM | POA: Diagnosis not present

## 2012-07-01 DIAGNOSIS — M4310 Spondylolisthesis, site unspecified: Secondary | ICD-10-CM | POA: Diagnosis not present

## 2012-07-14 DIAGNOSIS — E119 Type 2 diabetes mellitus without complications: Secondary | ICD-10-CM | POA: Diagnosis not present

## 2012-07-15 DIAGNOSIS — H353290 Exudative age-related macular degeneration, unspecified eye, stage unspecified: Secondary | ICD-10-CM | POA: Diagnosis not present

## 2012-07-15 DIAGNOSIS — H35059 Retinal neovascularization, unspecified, unspecified eye: Secondary | ICD-10-CM | POA: Diagnosis not present

## 2012-07-17 DIAGNOSIS — E119 Type 2 diabetes mellitus without complications: Secondary | ICD-10-CM | POA: Diagnosis not present

## 2012-07-17 DIAGNOSIS — E785 Hyperlipidemia, unspecified: Secondary | ICD-10-CM | POA: Diagnosis not present

## 2012-07-17 DIAGNOSIS — I10 Essential (primary) hypertension: Secondary | ICD-10-CM | POA: Diagnosis not present

## 2012-08-12 DIAGNOSIS — R252 Cramp and spasm: Secondary | ICD-10-CM | POA: Diagnosis not present

## 2012-08-12 DIAGNOSIS — M542 Cervicalgia: Secondary | ICD-10-CM | POA: Diagnosis not present

## 2012-08-12 DIAGNOSIS — M545 Low back pain, unspecified: Secondary | ICD-10-CM | POA: Diagnosis not present

## 2012-08-12 DIAGNOSIS — M2550 Pain in unspecified joint: Secondary | ICD-10-CM | POA: Diagnosis not present

## 2012-08-12 DIAGNOSIS — R5383 Other fatigue: Secondary | ICD-10-CM | POA: Diagnosis not present

## 2012-08-12 DIAGNOSIS — M5137 Other intervertebral disc degeneration, lumbosacral region: Secondary | ICD-10-CM | POA: Diagnosis not present

## 2012-08-12 DIAGNOSIS — IMO0001 Reserved for inherently not codable concepts without codable children: Secondary | ICD-10-CM | POA: Diagnosis not present

## 2012-08-12 DIAGNOSIS — R5381 Other malaise: Secondary | ICD-10-CM | POA: Diagnosis not present

## 2012-08-13 DIAGNOSIS — IMO0002 Reserved for concepts with insufficient information to code with codable children: Secondary | ICD-10-CM | POA: Diagnosis not present

## 2012-08-13 DIAGNOSIS — M47817 Spondylosis without myelopathy or radiculopathy, lumbosacral region: Secondary | ICD-10-CM | POA: Diagnosis not present

## 2012-08-14 ENCOUNTER — Other Ambulatory Visit: Payer: Self-pay | Admitting: Neurological Surgery

## 2012-08-14 DIAGNOSIS — M5416 Radiculopathy, lumbar region: Secondary | ICD-10-CM

## 2012-08-21 ENCOUNTER — Ambulatory Visit
Admission: RE | Admit: 2012-08-21 | Discharge: 2012-08-21 | Disposition: A | Discharge status: Home / Self Care | Payer: Medicare Other | Source: Ambulatory Visit | Attending: Neurological Surgery | Admitting: Neurological Surgery

## 2012-08-21 DIAGNOSIS — M5416 Radiculopathy, lumbar region: Secondary | ICD-10-CM

## 2012-08-21 DIAGNOSIS — M5126 Other intervertebral disc displacement, lumbar region: Secondary | ICD-10-CM | POA: Diagnosis not present

## 2012-08-21 DIAGNOSIS — M48061 Spinal stenosis, lumbar region without neurogenic claudication: Secondary | ICD-10-CM | POA: Diagnosis not present

## 2012-08-21 MED ORDER — GADOBENATE DIMEGLUMINE 529 MG/ML IV SOLN
15.0000 mL | Freq: Once | INTRAVENOUS | Status: AC | PRN
  Administered 2012-08-21: 15 mL via INTRAVENOUS

## 2012-08-26 DIAGNOSIS — M48061 Spinal stenosis, lumbar region without neurogenic claudication: Secondary | ICD-10-CM | POA: Diagnosis not present

## 2012-09-16 DIAGNOSIS — H353290 Exudative age-related macular degeneration, unspecified eye, stage unspecified: Secondary | ICD-10-CM | POA: Diagnosis not present

## 2012-09-16 DIAGNOSIS — H35059 Retinal neovascularization, unspecified, unspecified eye: Secondary | ICD-10-CM | POA: Diagnosis not present

## 2012-10-05 DIAGNOSIS — M48062 Spinal stenosis, lumbar region with neurogenic claudication: Secondary | ICD-10-CM | POA: Diagnosis not present

## 2012-10-05 DIAGNOSIS — IMO0002 Reserved for concepts with insufficient information to code with codable children: Secondary | ICD-10-CM | POA: Diagnosis not present

## 2012-10-05 DIAGNOSIS — M47817 Spondylosis without myelopathy or radiculopathy, lumbosacral region: Secondary | ICD-10-CM | POA: Diagnosis not present

## 2012-10-05 DIAGNOSIS — M5137 Other intervertebral disc degeneration, lumbosacral region: Secondary | ICD-10-CM | POA: Diagnosis not present

## 2012-10-15 DIAGNOSIS — IMO0002 Reserved for concepts with insufficient information to code with codable children: Secondary | ICD-10-CM | POA: Diagnosis not present

## 2012-10-15 DIAGNOSIS — M999 Biomechanical lesion, unspecified: Secondary | ICD-10-CM | POA: Diagnosis not present

## 2012-10-20 ENCOUNTER — Other Ambulatory Visit: Payer: Self-pay | Admitting: Endocrinology

## 2012-10-20 DIAGNOSIS — IMO0001 Reserved for inherently not codable concepts without codable children: Secondary | ICD-10-CM

## 2012-10-20 DIAGNOSIS — E785 Hyperlipidemia, unspecified: Secondary | ICD-10-CM

## 2012-10-27 ENCOUNTER — Other Ambulatory Visit (INDEPENDENT_AMBULATORY_CARE_PROVIDER_SITE_OTHER): Payer: Medicare Other

## 2012-10-27 DIAGNOSIS — IMO0001 Reserved for inherently not codable concepts without codable children: Secondary | ICD-10-CM | POA: Diagnosis not present

## 2012-10-27 DIAGNOSIS — M5137 Other intervertebral disc degeneration, lumbosacral region: Secondary | ICD-10-CM | POA: Diagnosis not present

## 2012-10-27 DIAGNOSIS — M5430 Sciatica, unspecified side: Secondary | ICD-10-CM | POA: Diagnosis not present

## 2012-10-27 DIAGNOSIS — E785 Hyperlipidemia, unspecified: Secondary | ICD-10-CM | POA: Diagnosis not present

## 2012-10-27 DIAGNOSIS — M999 Biomechanical lesion, unspecified: Secondary | ICD-10-CM | POA: Diagnosis not present

## 2012-10-27 LAB — BASIC METABOLIC PANEL
BUN: 18 mg/dL (ref 6–23)
CO2: 36 mEq/L — ABNORMAL HIGH (ref 19–32)
Calcium: 9.4 mg/dL (ref 8.4–10.5)
Chloride: 102 mEq/L (ref 96–112)
Creatinine, Ser: 1 mg/dL (ref 0.4–1.2)
GFR: 70.74 mL/min (ref >60.00)
Glucose, Bld: 61 mg/dL — ABNORMAL LOW (ref 70–99)
Potassium: 3.4 mEq/L — ABNORMAL LOW (ref 3.5–5.1)
Sodium: 140 mEq/L (ref 135–145)

## 2012-10-27 LAB — LDL CHOLESTEROL, DIRECT: Direct LDL: 153.2 mg/dL

## 2012-10-27 LAB — LIPID PANEL: Total CHOL/HDL Ratio: 4

## 2012-10-27 LAB — HEMOGLOBIN A1C: Hgb A1c MFr Bld: 6.2 % (ref 4.6–6.5)

## 2012-10-28 DIAGNOSIS — M5137 Other intervertebral disc degeneration, lumbosacral region: Secondary | ICD-10-CM | POA: Diagnosis not present

## 2012-10-28 DIAGNOSIS — M5430 Sciatica, unspecified side: Secondary | ICD-10-CM | POA: Diagnosis not present

## 2012-10-28 DIAGNOSIS — M999 Biomechanical lesion, unspecified: Secondary | ICD-10-CM | POA: Diagnosis not present

## 2012-10-29 ENCOUNTER — Ambulatory Visit (INDEPENDENT_AMBULATORY_CARE_PROVIDER_SITE_OTHER): Payer: Medicare Other | Admitting: Endocrinology

## 2012-10-29 VITALS — BP 130/80 | HR 70 | Ht 65.0 in | Wt 157.9 lb

## 2012-10-29 DIAGNOSIS — E119 Type 2 diabetes mellitus without complications: Secondary | ICD-10-CM

## 2012-10-29 DIAGNOSIS — E785 Hyperlipidemia, unspecified: Secondary | ICD-10-CM

## 2012-10-29 DIAGNOSIS — E1140 Type 2 diabetes mellitus with diabetic neuropathy, unspecified: Secondary | ICD-10-CM

## 2012-10-29 DIAGNOSIS — M5137 Other intervertebral disc degeneration, lumbosacral region: Secondary | ICD-10-CM | POA: Diagnosis not present

## 2012-10-29 DIAGNOSIS — I10 Essential (primary) hypertension: Secondary | ICD-10-CM | POA: Diagnosis not present

## 2012-10-29 DIAGNOSIS — M5430 Sciatica, unspecified side: Secondary | ICD-10-CM | POA: Diagnosis not present

## 2012-10-29 DIAGNOSIS — E876 Hypokalemia: Secondary | ICD-10-CM

## 2012-10-29 DIAGNOSIS — M999 Biomechanical lesion, unspecified: Secondary | ICD-10-CM | POA: Diagnosis not present

## 2012-10-29 HISTORY — DX: Type 2 diabetes mellitus with diabetic neuropathy, unspecified: E11.40

## 2012-10-29 MED ORDER — POTASSIUM CHLORIDE CRYS ER 20 MEQ PO TBCR: 20.0000 meq | EXTENDED_RELEASE_TABLET | Freq: Every day | ORAL | Status: DC

## 2012-10-29 NOTE — Patient Instructions (Addendum)
CHECK TO SEE if getting 500mg  magnesium daily  Start K-Dur daily  More sugars after supper  3 units at breakfast of Humalog

## 2012-10-29 NOTE — Progress Notes (Signed)
Patient ID: ALAISA [name], female   DOB: Jan 18, 1941, 72 y.o.   MRN: [id number] Reason for Appointment: Diabetes follow-up   History of Present Illness     Diabetes:   Reason for visit 1 month followup .  Diagnosis: Type 2 diabetes mellitus, date of diagnosis: 1998.  Complications: are: none.  Monitors blood glucose: Once a day or more.  Glucometer: One Touch.  Blood Glucose readings: overall median 109 Fasting readings median 122, highest 159. Mid day median 84, highest 119 and sporadic afternoon and evening readings ranging from 84-167 Hypoglycemia frequency: rarely now with lowest reading 66 at 11 AM  Meals: 3 meals per day at 10 am. 2 pm and 6-8 pm but inconsistent schedule. Will sometimes get protein at breakfast, usually eating relatively light lunch and small portions and carbohydrates .   Physical activity: exercise: walking with her dog periodically  Certified Diabetes Educator visit: Most recent: 7/13.  Dietician visit: Most recent: 3/13.   Recent history:  The patient is seen today in follow up of Type 2 diabetes: The patient's diabetes control appears to be overall very well controlled with good readings at home, near-normal A1c and minimal hypoglycemia. Her A1c is the best in a long time, previously 7.1 She is however getting readings mostly on waking up and occasionally at lunch. However does not appear to have any significant postprandial hyperglycemia recently. She is generally watching her diet and trying to be as active as possible. She does not adjust her lunchtime does enough when she is getting smaller amount of carbohydrate and appears to begin relatively more insulin than needed at breakfast Amaryl was stopped on her last visit and this has reduced daytime hypoglycemia; she is compliant with her basal bolus regimen  The insulin regimen is described as premeal rapid analog 4 ACB, 6-8 units at lunch and supper; Lantus insulin 5-6 units daily ACS.   The  microalbumin has been tested , and the result is 42.   Hyperlipidemia:   The lipid abnormality consists of elevated LDL , high triglyceride and she is not taking the pravastatin. She Says that pravastatin was stopped because of her eye doctor's   recommendation, apparently having some nerve damage and wet macular degeneration.  Is being treated by PCP now with Zetia but she depends on samples only and is not taking it recently with higher LDL of 153.     HYPERTENSION:  Has been present for several years.  The blood pressure control is better recently   Appointment on 10/27/2012  Component Date Value Range Status  . Hemoglobin A1C 10/27/2012 6.2  4.6 - 6.5 % Final   Glycemic Control Guidelines for People with Diabetes:Non Diabetic:  <6%Goal of Therapy: <7%Additional Action Suggested:  >8%   . Sodium 10/27/2012 140  135 - 145 mEq/L Final  . Potassium 10/27/2012 3.4* 3.5 - 5.1 mEq/L Final  . Chloride 10/27/2012 102  96 - 112 mEq/L Final  . CO2 10/27/2012 36* 19 - 32 mEq/L Final  . Glucose, Bld 10/27/2012 61* 70 - 99 mg/dL Final  . BUN 56/21/3086 18  6 - 23 mg/dL Final  . Creatinine, Ser 10/27/2012 1.0  0.4 - 1.2 mg/dL Final  . Calcium 57/84/6962 9.4  8.4 - 10.5 mg/dL Final  . GFR 95/28/4132 70.74  >60.00 mL/min Final  . Cholesterol 10/27/2012 223* 0 - 200 mg/dL Final   ATP III Classification       Desirable:  < 200 mg/dL  Borderline High:  200 - 239 mg/dL          High:  > = 045 mg/dL  . Triglycerides 10/27/2012 142.0  0.0 - 149.0 mg/dL Final   Normal:  <409 mg/dLBorderline High:  150 - 199 mg/dL  . HDL 10/27/2012 54.90  >39.00 mg/dL Final  . VLDL 81/19/1478 28.4  0.0 - 40.0 mg/dL Final  . Total CHOL/HDL Ratio 10/27/2012 4   Final                  Men          Women1/2 Average Risk     3.4          3.3Average Risk          5.0          4.42X Average Risk          9.6          7.13X Average Risk          15.0          11.0                      . Direct LDL 10/27/2012 153.2    Final   Optimal:  <100 mg/dLNear or Above Optimal:  100-129 mg/dLBorderline High:  130-159 mg/dLHigh:  160-189 mg/dLVery High:  >190 mg/dL      Medication List       This list is accurate as of: 10/29/12 10:54 AM.  Always use your most recent med list.               calcium-vitamin D 500-200 MG-UNIT per tablet  Commonly known as:  OSCAL WITH D  Take 1 tablet by mouth daily.     ezetimibe 10 MG tablet  Commonly known as:  ZETIA  Take 10 mg by mouth daily.     fluvastatin XL 80 MG 24 hr tablet  Commonly known as:  LESCOL XL  Take 80 mg by mouth daily.     gabapentin 100 MG capsule  Commonly known as:  NEURONTIN  Take 100-300 mg by mouth 3 (three) times daily. Pt takes 100mg  twice daily and then 300mg  at bedtime     glimepiride 2 MG tablet  Commonly known as:  AMARYL  Take 4 mg by mouth daily before breakfast.     HYDROcodone-acetaminophen 10-325 MG per tablet  Commonly known as:  NORCO  Take 1 tablet by mouth every 6 (six) hours as needed. For pain     insulin lispro 100 UNIT/ML injection  Commonly known as:  HUMALOG  Inject 3-4 Units into the skin 3 (three) times daily before meals.     omeprazole 10 MG capsule  Commonly known as:  PRILOSEC  Take 10 mg by mouth daily.     potassium chloride SA 20 MEQ tablet  Commonly known as:  K-DUR,KLOR-CON  Take 20 mEq by mouth daily.     pregabalin 50 MG capsule  Commonly known as:  LYRICA  Take 50 mg by mouth at bedtime.     saxagliptin HCl 2.5 MG Tabs tablet  Commonly known as:  ONGLYZA  Take 5 mg by mouth daily.     valsartan-hydrochlorothiazide 320-12.5 MG per tablet  Commonly known as:  DIOVAN-HCT  Take 1 tablet by mouth daily.     VITAMIN B-6 PO  Take 1 tablet by mouth daily.     VITAMIN B12 PO  Take 1 tablet by  mouth daily.     zolpidem 10 MG tablet  Commonly known as:  AMBIEN  Take 5 mg by mouth at bedtime as needed. For sleep        Allergies:  Allergies  Allergen Reactions  . Adhesive (Tape)  Rash    Past Medical History  Diagnosis Date  . Complication of anesthesia     wakes up shaking  . Diabetes mellitus   . Heart murmur     sees Dr. Shana Chute, last seen- early 2012  . Asthma     has used inhaler in past for asthmatic bronchitis, last time- early 2012  . Sleep apnea     borderline sleep apnea, states she no longer uses, early 2012- stopped using   . GERD (gastroesophageal reflux disease)     occas. use of  Prilosec  . Neuromuscular disorder     lumbar radiculopathy, lumbago  . Fibromyalgia   . Arthritis   . Hypertension     02/2010- stress test /w PCP    Past Surgical History  Procedure Laterality Date  . Back surgery      2012  . Abdominal hysterectomy    . Adb.cyst      ovarian cyst  . Eye surgery      macular degeneration treatment - injections    Family History  Problem Relation Age of Onset  . Anesthesia problems Neg Hx   . Hypotension Neg Hx   . Malignant hyperthermia Neg Hx   . Pseudochol deficiency Neg Hx     Social History:  reports that she quit smoking about 13 years ago. She does not have any smokeless tobacco history on file. She reports that  drinks alcohol. She reports that she does not use illicit drugs.  Review of Systems  Eyes: Negative for blurred vision.       Getting regular injections in her eyes for macular degeneration  Cardiovascular: Negative for leg swelling.  Gastrointestinal: Negative for diarrhea.  Musculoskeletal: Positive for back pain.  Neurological: Negative for tingling.  Psychiatric/Behavioral: Negative for depression.      Examination:   BP 130/80  Pulse 70  Ht 5\' 5"  (1.651 m)  Wt 157 lb 14.4 oz (71.623 kg)  BMI 26.28 kg/m2  SpO2 99%  Body mass index is 26.28 kg/(m^2).   No pedal edema  Assesment/plan:   1. Diabetes type 2, uncontrolled - 250.02  The patient's diabetes control appears to be overall very well controlled with near-normal A1c and minimal hypoglycemia  Changes suggested to the  patient today: Take Amaryl before supper rather than at bedtime More blood sugars before going to bed to help adjust evening Humalog Reduced dose of Humalog at breakfast to 3 units since lunchtime readings are low normal. May take 45 units if eating a large breakfast May continue to adjust mealtime insulin based on carbohydrate intake and reduce to 4 units at lunch or supper if eating only salads Continue same doses of Lantus. She finds that increasing the dose by one unit helps overnight sugars if eating a large meal in the evening Hopefully she will be able to exercise more regularly  May need to check motor readings after lunch also  2. Hyperlipidemia:She will discuss starting a statin drug with her ophthalmologist who is concerned about effects on macular degeneration, currently on no drug. Apparently intolerant to Lipitor and is not getting samples of Zetia enough to get a benefit  3. Mild hypokalemia: She is taking only an OTC potassium supplement  and will need a prescription for this today consistently with her HCTZ, currently potassium is 3.4. She also may have low magnesium and discussed getting enough supplements because of her taking diuretics   4. Hypertension: Well controlled but is getting hypokalemia from her HCTZ  Counseling time over 50% of her 25 minute visit   Tyshon Fanning 10/29/2012, 10:54 AM

## 2012-11-02 ENCOUNTER — Encounter: Payer: Self-pay | Admitting: Endocrinology

## 2012-11-02 DIAGNOSIS — M5137 Other intervertebral disc degeneration, lumbosacral region: Secondary | ICD-10-CM | POA: Diagnosis not present

## 2012-11-02 DIAGNOSIS — M5430 Sciatica, unspecified side: Secondary | ICD-10-CM | POA: Diagnosis not present

## 2012-11-02 DIAGNOSIS — M999 Biomechanical lesion, unspecified: Secondary | ICD-10-CM | POA: Diagnosis not present

## 2012-11-02 DIAGNOSIS — E876 Hypokalemia: Secondary | ICD-10-CM | POA: Insufficient documentation

## 2012-11-02 DIAGNOSIS — I10 Essential (primary) hypertension: Secondary | ICD-10-CM | POA: Insufficient documentation

## 2012-11-02 DIAGNOSIS — M9981 Other biomechanical lesions of cervical region: Secondary | ICD-10-CM | POA: Diagnosis not present

## 2012-11-03 DIAGNOSIS — M5430 Sciatica, unspecified side: Secondary | ICD-10-CM | POA: Diagnosis not present

## 2012-11-03 DIAGNOSIS — M5137 Other intervertebral disc degeneration, lumbosacral region: Secondary | ICD-10-CM | POA: Diagnosis not present

## 2012-11-03 DIAGNOSIS — M9981 Other biomechanical lesions of cervical region: Secondary | ICD-10-CM | POA: Diagnosis not present

## 2012-11-03 DIAGNOSIS — M999 Biomechanical lesion, unspecified: Secondary | ICD-10-CM | POA: Diagnosis not present

## 2012-11-04 DIAGNOSIS — M5137 Other intervertebral disc degeneration, lumbosacral region: Secondary | ICD-10-CM | POA: Diagnosis not present

## 2012-11-04 DIAGNOSIS — M5430 Sciatica, unspecified side: Secondary | ICD-10-CM | POA: Diagnosis not present

## 2012-11-04 DIAGNOSIS — M999 Biomechanical lesion, unspecified: Secondary | ICD-10-CM | POA: Diagnosis not present

## 2012-11-04 DIAGNOSIS — M9981 Other biomechanical lesions of cervical region: Secondary | ICD-10-CM | POA: Diagnosis not present

## 2012-11-09 DIAGNOSIS — M5137 Other intervertebral disc degeneration, lumbosacral region: Secondary | ICD-10-CM | POA: Diagnosis not present

## 2012-11-09 DIAGNOSIS — M5430 Sciatica, unspecified side: Secondary | ICD-10-CM | POA: Diagnosis not present

## 2012-11-09 DIAGNOSIS — M9981 Other biomechanical lesions of cervical region: Secondary | ICD-10-CM | POA: Diagnosis not present

## 2012-11-09 DIAGNOSIS — M999 Biomechanical lesion, unspecified: Secondary | ICD-10-CM | POA: Diagnosis not present

## 2012-11-10 DIAGNOSIS — M9981 Other biomechanical lesions of cervical region: Secondary | ICD-10-CM | POA: Diagnosis not present

## 2012-11-10 DIAGNOSIS — M999 Biomechanical lesion, unspecified: Secondary | ICD-10-CM | POA: Diagnosis not present

## 2012-11-10 DIAGNOSIS — M5137 Other intervertebral disc degeneration, lumbosacral region: Secondary | ICD-10-CM | POA: Diagnosis not present

## 2012-11-10 DIAGNOSIS — M5430 Sciatica, unspecified side: Secondary | ICD-10-CM | POA: Diagnosis not present

## 2012-11-11 DIAGNOSIS — M5137 Other intervertebral disc degeneration, lumbosacral region: Secondary | ICD-10-CM | POA: Diagnosis not present

## 2012-11-11 DIAGNOSIS — M999 Biomechanical lesion, unspecified: Secondary | ICD-10-CM | POA: Diagnosis not present

## 2012-11-11 DIAGNOSIS — M5430 Sciatica, unspecified side: Secondary | ICD-10-CM | POA: Diagnosis not present

## 2012-11-11 DIAGNOSIS — M9981 Other biomechanical lesions of cervical region: Secondary | ICD-10-CM | POA: Diagnosis not present

## 2012-11-16 DIAGNOSIS — M5430 Sciatica, unspecified side: Secondary | ICD-10-CM | POA: Diagnosis not present

## 2012-11-16 DIAGNOSIS — M5137 Other intervertebral disc degeneration, lumbosacral region: Secondary | ICD-10-CM | POA: Diagnosis not present

## 2012-11-16 DIAGNOSIS — M9981 Other biomechanical lesions of cervical region: Secondary | ICD-10-CM | POA: Diagnosis not present

## 2012-11-16 DIAGNOSIS — M999 Biomechanical lesion, unspecified: Secondary | ICD-10-CM | POA: Diagnosis not present

## 2012-11-17 DIAGNOSIS — IMO0001 Reserved for inherently not codable concepts without codable children: Secondary | ICD-10-CM | POA: Diagnosis not present

## 2012-11-17 DIAGNOSIS — M999 Biomechanical lesion, unspecified: Secondary | ICD-10-CM | POA: Diagnosis not present

## 2012-11-17 DIAGNOSIS — M5137 Other intervertebral disc degeneration, lumbosacral region: Secondary | ICD-10-CM | POA: Diagnosis not present

## 2012-11-17 DIAGNOSIS — M810 Age-related osteoporosis without current pathological fracture: Secondary | ICD-10-CM | POA: Diagnosis not present

## 2012-11-17 DIAGNOSIS — M5430 Sciatica, unspecified side: Secondary | ICD-10-CM | POA: Diagnosis not present

## 2012-11-17 DIAGNOSIS — M5030 Other cervical disc degeneration, unspecified cervical region: Secondary | ICD-10-CM | POA: Diagnosis not present

## 2012-11-17 DIAGNOSIS — M9981 Other biomechanical lesions of cervical region: Secondary | ICD-10-CM | POA: Diagnosis not present

## 2012-11-19 DIAGNOSIS — M5430 Sciatica, unspecified side: Secondary | ICD-10-CM | POA: Diagnosis not present

## 2012-11-19 DIAGNOSIS — M999 Biomechanical lesion, unspecified: Secondary | ICD-10-CM | POA: Diagnosis not present

## 2012-11-19 DIAGNOSIS — M9981 Other biomechanical lesions of cervical region: Secondary | ICD-10-CM | POA: Diagnosis not present

## 2012-11-19 DIAGNOSIS — M5137 Other intervertebral disc degeneration, lumbosacral region: Secondary | ICD-10-CM | POA: Diagnosis not present

## 2012-11-23 DIAGNOSIS — M9981 Other biomechanical lesions of cervical region: Secondary | ICD-10-CM | POA: Diagnosis not present

## 2012-11-23 DIAGNOSIS — M5137 Other intervertebral disc degeneration, lumbosacral region: Secondary | ICD-10-CM | POA: Diagnosis not present

## 2012-11-23 DIAGNOSIS — M999 Biomechanical lesion, unspecified: Secondary | ICD-10-CM | POA: Diagnosis not present

## 2012-11-23 DIAGNOSIS — M5430 Sciatica, unspecified side: Secondary | ICD-10-CM | POA: Diagnosis not present

## 2012-11-25 DIAGNOSIS — G4710 Hypersomnia, unspecified: Secondary | ICD-10-CM | POA: Diagnosis not present

## 2012-11-25 DIAGNOSIS — G4730 Sleep apnea, unspecified: Secondary | ICD-10-CM | POA: Diagnosis not present

## 2012-11-26 ENCOUNTER — Other Ambulatory Visit: Payer: Self-pay | Admitting: Endocrinology

## 2012-12-15 DIAGNOSIS — H353190 Nonexudative age-related macular degeneration, unspecified eye, stage unspecified: Secondary | ICD-10-CM | POA: Diagnosis not present

## 2012-12-15 DIAGNOSIS — H353290 Exudative age-related macular degeneration, unspecified eye, stage unspecified: Secondary | ICD-10-CM | POA: Diagnosis not present

## 2012-12-15 DIAGNOSIS — H35059 Retinal neovascularization, unspecified, unspecified eye: Secondary | ICD-10-CM | POA: Diagnosis not present

## 2012-12-15 DIAGNOSIS — H3320 Serous retinal detachment, unspecified eye: Secondary | ICD-10-CM | POA: Diagnosis not present

## 2012-12-23 DIAGNOSIS — J301 Allergic rhinitis due to pollen: Secondary | ICD-10-CM | POA: Diagnosis not present

## 2012-12-23 DIAGNOSIS — J3089 Other allergic rhinitis: Secondary | ICD-10-CM | POA: Diagnosis not present

## 2012-12-23 DIAGNOSIS — J45909 Unspecified asthma, uncomplicated: Secondary | ICD-10-CM | POA: Diagnosis not present

## 2012-12-28 DIAGNOSIS — Z1289 Encounter for screening for malignant neoplasm of other sites: Secondary | ICD-10-CM | POA: Diagnosis not present

## 2012-12-29 DIAGNOSIS — R928 Other abnormal and inconclusive findings on diagnostic imaging of breast: Secondary | ICD-10-CM | POA: Diagnosis not present

## 2012-12-29 DIAGNOSIS — Z09 Encounter for follow-up examination after completed treatment for conditions other than malignant neoplasm: Secondary | ICD-10-CM | POA: Diagnosis not present

## 2013-02-11 ENCOUNTER — Other Ambulatory Visit: Payer: Medicare Other

## 2013-02-11 ENCOUNTER — Other Ambulatory Visit: Payer: Self-pay | Admitting: *Deleted

## 2013-02-11 DIAGNOSIS — E119 Type 2 diabetes mellitus without complications: Secondary | ICD-10-CM | POA: Diagnosis not present

## 2013-02-11 LAB — LIPID PANEL
HDL: 51.2 mg/dL (ref >39.00)
LDL Cholesterol: 87 mg/dL (ref 0–99)
Total CHOL/HDL Ratio: 3

## 2013-02-11 LAB — COMPREHENSIVE METABOLIC PANEL
ALT: 16 U/L (ref 0–35)
CO2: 30 mEq/L (ref 19–32)
Calcium: 9.1 mg/dL (ref 8.4–10.5)
Chloride: 104 mEq/L (ref 96–112)
Creatinine, Ser: 0.8 mg/dL (ref 0.4–1.2)
GFR: 91.71 mL/min (ref >60.00)
Sodium: 143 mEq/L (ref 135–145)
Total Protein: 6.9 g/dL (ref 6.0–8.3)

## 2013-02-11 LAB — URINALYSIS
Hgb urine dipstick: NEGATIVE
Ketones, ur: NEGATIVE
Specific Gravity, Urine: 1.01 (ref 1.000–1.030)
Total Protein, Urine: NEGATIVE
Urine Glucose: NEGATIVE

## 2013-02-11 LAB — MICROALBUMIN / CREATININE URINE RATIO: Microalb, Ur: 2.3 mg/dL — ABNORMAL HIGH (ref 0.0–1.9)

## 2013-02-11 LAB — HEMOGLOBIN A1C: Hgb A1c MFr Bld: 6.3 % (ref 4.6–6.5)

## 2013-02-16 DIAGNOSIS — H35059 Retinal neovascularization, unspecified, unspecified eye: Secondary | ICD-10-CM | POA: Diagnosis not present

## 2013-02-16 DIAGNOSIS — H353290 Exudative age-related macular degeneration, unspecified eye, stage unspecified: Secondary | ICD-10-CM | POA: Diagnosis not present

## 2013-02-18 ENCOUNTER — Encounter: Payer: Self-pay | Admitting: Endocrinology

## 2013-02-18 ENCOUNTER — Ambulatory Visit (INDEPENDENT_AMBULATORY_CARE_PROVIDER_SITE_OTHER): Payer: Medicare Other | Admitting: Endocrinology

## 2013-02-18 VITALS — BP 148/74 | HR 67 | Temp 98.6°F | Resp 12 | Ht 66.0 in | Wt 161.1 lb

## 2013-02-18 DIAGNOSIS — E119 Type 2 diabetes mellitus without complications: Secondary | ICD-10-CM | POA: Diagnosis not present

## 2013-02-18 DIAGNOSIS — Z23 Encounter for immunization: Secondary | ICD-10-CM

## 2013-02-18 DIAGNOSIS — E785 Hyperlipidemia, unspecified: Secondary | ICD-10-CM | POA: Diagnosis not present

## 2013-02-18 DIAGNOSIS — K5730 Diverticulosis of large intestine without perforation or abscess without bleeding: Secondary | ICD-10-CM | POA: Diagnosis not present

## 2013-02-18 DIAGNOSIS — R198 Other specified symptoms and signs involving the digestive system and abdomen: Secondary | ICD-10-CM | POA: Diagnosis not present

## 2013-02-18 DIAGNOSIS — Z86010 Personal history of colonic polyps: Secondary | ICD-10-CM | POA: Diagnosis not present

## 2013-02-18 NOTE — Progress Notes (Signed)
Patient ID: [name], female   DOB: 1940-12-29, 72 y.o.   MRN: [id number]  Reason for Appointment: Diabetes follow-up   History of Present Illness     Diagnosis: Type 2 diabetes mellitus, date of diagnosis: 1998.   Recent history:  The patient's diabetes  appears to be overall very well controlled with nearly normal readings at home She is still tending to take relatively larger doses of mealtime coverage even though her readings are frequently low normal around lunchtime and also not reducing the dose when she is eating very little carbohydrate such as a meal with salad and grilled chicken  Her overall median reading is only 105 but is not checking readings after supper as discussed on the last visit She was told to take her Amaryl at suppertime instead of bedtime on the last visit  The insulin regimen is described as premeal rapid analog 4 ACB, 6-8 units at lunch and supper; Lantus insulin 5-6 units daily ACS.   Complications: are: none.  Monitors blood glucose: 1.7 times a day  Glucometer: One Touch.  Blood Glucose readings: overall median 105 Morning readings 70-163, some readings at 10 AM, midday 62-144, afternoon/suppertime 80-216 with only one high reading Hypoglycemia frequency: rarely now with lowest reading 62 at 11 AM  Meals: 3 meals per day at 10 am. 2 pm and 6-8 pm but inconsistent schedule. Will sometimes get protein at breakfast, usually eating relatively light lunch and small portions and carbohydrates .   Physical activity: exercise: walking periodically Certified Diabetes Educator visit: Most recent: 7/13.  Dietician visit: Most recent: 3/13.  The microalbumin has been tested , and the result is 42.   Hyperlipidemia:   The lipid abnormality consists of elevated LDL , high triglyceride and Is being treated by PCP now with Zetia and Lescol, now well controlled   HYPERTENSION:  Has been present for several years. this is treated by PCP  LABS:  No visits  with results within 1 Week(s) from this visit. Latest known visit with results is:  Appointment on 02/11/2013  Component Date Value Range Status  . Microalb, Ur 02/11/2013 2.3* 0.0 - 1.9 mg/dL Final  . Creatinine,U 82/95/6213 45.4   Final  . Microalb Creat Ratio 02/11/2013 5.1  0.0 - 30.0 mg/g Final  . Color, Urine 02/11/2013 LT. YELLOW  Yellow;Lt. Yellow Final  . APPearance 02/11/2013 CLEAR  Clear Final  . Specific Gravity, Urine 02/11/2013 1.010  1.000-1.030 Final  . pH 02/11/2013 6.0  5.0 - 8.0 Final  . Total Protein, Urine 02/11/2013 NEGATIVE  Negative Final  . Urine Glucose 02/11/2013 NEGATIVE  Negative Final  . Ketones, ur 02/11/2013 NEGATIVE  Negative Final  . Bilirubin Urine 02/11/2013 NEGATIVE  Negative Final  . Hgb urine dipstick 02/11/2013 NEGATIVE  Negative Final  . Urobilinogen, UA 02/11/2013 0.2  0.0 - 1.0 Final  . Leukocytes, UA 02/11/2013 NEGATIVE  Negative Final  . Nitrite 02/11/2013 NEGATIVE  Negative Final  . Hemoglobin A1C 02/11/2013 6.3  4.6 - 6.5 % Final   Glycemic Control Guidelines for People with Diabetes:Non Diabetic:  <6%Goal of Therapy: <7%Additional Action Suggested:  >8%   . Sodium 02/11/2013 143  135 - 145 mEq/L Final  . Potassium 02/11/2013 3.7  3.5 - 5.1 mEq/L Final  . Chloride 02/11/2013 104  96 - 112 mEq/L Final  . CO2 02/11/2013 30  19 - 32 mEq/L Final  . Glucose, Bld 02/11/2013 91  70 - 99 mg/dL Final  . BUN 08/65/7846 13  6 - 23 mg/dL Final  . Creatinine, Ser 02/11/2013 0.8  0.4 - 1.2 mg/dL Final  . Total Bilirubin 02/11/2013 0.4  0.3 - 1.2 mg/dL Final  . Alkaline Phosphatase 02/11/2013 41  39 - 117 U/L Final  . AST 02/11/2013 18  0 - 37 U/L Final  . ALT 02/11/2013 16  0 - 35 U/L Final  . Total Protein 02/11/2013 6.9  6.0 - 8.3 g/dL Final  . Albumin 86/57/8469 4.2  3.5 - 5.2 g/dL Final  . Calcium 62/95/2841 9.1  8.4 - 10.5 mg/dL Final  . GFR 32/44/0102 91.71  >60.00 mL/min Final  . Cholesterol 02/11/2013 156  0 - 200 mg/dL Final   ATP III  Classification       Desirable:  < 200 mg/dL               Borderline High:  200 - 239 mg/dL          High:  > = 725 mg/dL  . Triglycerides 02/11/2013 89.0  0.0 - 149.0 mg/dL Final   Normal:  <366 mg/dLBorderline High:  150 - 199 mg/dL  . HDL 02/11/2013 51.20  >39.00 mg/dL Final  . VLDL 44/06/4740 17.8  0.0 - 40.0 mg/dL Final  . LDL Cholesterol 02/11/2013 87  0 - 99 mg/dL Final  . Total CHOL/HDL Ratio 02/11/2013 3   Final                  Men          Women1/2 Average Risk     3.4          3.3Average Risk          5.0          4.42X Average Risk          9.6          7.13X Average Risk          15.0          11.0                          Medication List       This list is accurate as of: 02/18/13  4:12 PM.  Always use your most recent med list.               acyclovir 400 MG tablet  Commonly known as:  ZOVIRAX     calcium-vitamin D 500-200 MG-UNIT per tablet  Commonly known as:  OSCAL WITH D  Take 1 tablet by mouth daily.     CRESTOR 10 MG tablet  Generic drug:  rosuvastatin     ezetimibe 10 MG tablet  Commonly known as:  ZETIA  Take 10 mg by mouth daily.     fluticasone 50 MCG/ACT nasal spray  Commonly known as:  FLONASE     fluvastatin XL 80 MG 24 hr tablet  Commonly known as:  LESCOL XL  Take 80 mg by mouth daily.     gabapentin 100 MG capsule  Commonly known as:  NEURONTIN  Take 100-300 mg by mouth 3 (three) times daily. Pt takes 100mg  twice daily and then 300mg  at bedtime     glimepiride 2 MG tablet  Commonly known as:  AMARYL  Take 4 mg by mouth daily before breakfast.     HYDROcodone-acetaminophen 10-325 MG per tablet  Commonly known as:  NORCO  Take 1 tablet by mouth every 6 (six)  hours as needed. For pain     ibuprofen 800 MG tablet  Commonly known as:  ADVIL,MOTRIN     insulin lispro 100 UNIT/ML injection  Commonly known as:  HUMALOG  Inject 3-4 Units into the skin 3 (three) times daily before meals.     HUMALOG KWIKPEN 100 UNIT/ML Sopn  Generic  drug:  insulin lispro  INJECT 6 UNITS UNDER THE SKIN BEFORE BREAKFAST, 10 UNITS BEFORE LUNCH AND 9 UNITS BEFORE SUPPER     omeprazole 10 MG capsule  Commonly known as:  PRILOSEC  Take 10 mg by mouth daily.     PATANASE 0.6 % Soln  Generic drug:  Olopatadine HCl     potassium chloride SA 20 MEQ tablet  Commonly known as:  K-DUR,KLOR-CON  Take 1 tablet (20 mEq total) by mouth daily.     pregabalin 50 MG capsule  Commonly known as:  LYRICA  Take 50 mg by mouth at bedtime.     valsartan-hydrochlorothiazide 320-12.5 MG per tablet  Commonly known as:  DIOVAN-HCT  Take 1 tablet by mouth daily.     VITAMIN B-6 PO  Take 1 tablet by mouth daily.     VITAMIN B12 PO  Take 1 tablet by mouth daily.     zolpidem 10 MG tablet  Commonly known as:  AMBIEN  Take 5 mg by mouth at bedtime as needed. For sleep        Allergies:  Allergies  Allergen Reactions  . Adhesive [Tape] Rash  . Lipitor [Atorvastatin] Rash    Past Medical History  Diagnosis Date  . Complication of anesthesia     wakes up shaking  . Diabetes mellitus   . Heart murmur     sees Dr. Shana Chute, last seen- early 2012  . Asthma     has used inhaler in past for asthmatic bronchitis, last time- early 2012  . Sleep apnea     borderline sleep apnea, states she no longer uses, early 2012- stopped using   . GERD (gastroesophageal reflux disease)     occas. use of  Prilosec  . Neuromuscular disorder     lumbar radiculopathy, lumbago  . Fibromyalgia   . Arthritis   . Hypertension     02/2010- stress test /w PCP    Past Surgical History  Procedure Laterality Date  . Back surgery      2012  . Abdominal hysterectomy    . Adb.cyst      ovarian cyst  . Eye surgery      macular degeneration treatment - injections    Family History  Problem Relation Age of Onset  . Anesthesia problems Neg Hx   . Hypotension Neg Hx   . Malignant hyperthermia Neg Hx   . Pseudochol deficiency Neg Hx     Social History:  reports  that she quit smoking about 13 years ago. She does not have any smokeless tobacco history on file. She reports that she drinks alcohol. She reports that she does not use illicit drugs.  ROS She was having diarrhea but this is better this week  Gabapentin is being used for neuropathy, less symptoms now   Examination:   BP 148/74  Pulse 67  Temp(Src) 98.6 F (37 C)  Resp 12  Ht 5\' 6"  (1.676 m)  Wt 161 lb 1.6 oz (73.074 kg)  BMI 26.01 kg/m2  SpO2 97%  Body mass index is 26.01 kg/(m^2).   No pedal edema  Assesment/Plan:   1. Diabetes type  2 with minimal obesity  The patient's diabetes control appears to be overall very well controlled with near blood sugars and minimal hypoglycemia She is also requiring small doses of insulin She still has low normal blood sugars late morning with median reading only 90 at lunchtime Does not have any readings after her evening meal  Changes suggested to the patient today:  Take less Humalog for her breakfast and for eating low carbohydrate meals like salads More blood sugars after supper to help assess her suppertime dose May try taking 2-3 units for covering her evening popcorn snack  2. History of neuropathy: She is having fewer symptoms and may take gabapentin only as needed, currently taking multiple drugs  3. Hypertension: Well controlled but has relatively higher reading today  4. Hyperlipidemia: Well controlled    Makaio Mach 02/18/2013, 4:12 PM

## 2013-02-18 NOTE — Patient Instructions (Signed)
More readings after supper or bedtime

## 2013-02-19 ENCOUNTER — Other Ambulatory Visit: Payer: Self-pay | Admitting: *Deleted

## 2013-03-02 ENCOUNTER — Other Ambulatory Visit: Payer: Self-pay | Admitting: Endocrinology

## 2013-03-25 ENCOUNTER — Other Ambulatory Visit: Payer: Self-pay | Admitting: *Deleted

## 2013-03-25 DIAGNOSIS — Z23 Encounter for immunization: Secondary | ICD-10-CM

## 2013-03-25 MED ORDER — INSULIN GLARGINE 100 UNIT/ML SOLOSTAR PEN: PEN_INJECTOR | SUBCUTANEOUS | Status: DC

## 2013-04-05 DIAGNOSIS — G4710 Hypersomnia, unspecified: Secondary | ICD-10-CM | POA: Diagnosis not present

## 2013-04-05 DIAGNOSIS — R350 Frequency of micturition: Secondary | ICD-10-CM | POA: Diagnosis not present

## 2013-04-27 DIAGNOSIS — H35059 Retinal neovascularization, unspecified, unspecified eye: Secondary | ICD-10-CM | POA: Diagnosis not present

## 2013-04-27 DIAGNOSIS — H353290 Exudative age-related macular degeneration, unspecified eye, stage unspecified: Secondary | ICD-10-CM | POA: Diagnosis not present

## 2013-05-10 ENCOUNTER — Telehealth: Payer: Self-pay | Admitting: *Deleted

## 2013-05-10 NOTE — Telephone Encounter (Signed)
If there is no redness, swelling or pus formation then she does not need to be seen, she can apply OTC Neosporin pain relief formulation. Also she does need to make a followup appointment which she does not have

## 2013-05-10 NOTE — Telephone Encounter (Signed)
Noted patient is aware, appt was made for 2/26 @ 1:15

## 2013-05-10 NOTE — Telephone Encounter (Signed)
Patient says that two weeks ago she had a pedicure done and she had in grown toenails that were removed, she said now her two big toes are very sore, but there is nothing there.  She's not sure if she needs to be seen by you, or if something needs to be called in?  Please advise

## 2013-05-20 DIAGNOSIS — M48062 Spinal stenosis, lumbar region with neurogenic claudication: Secondary | ICD-10-CM | POA: Diagnosis not present

## 2013-05-20 DIAGNOSIS — M47817 Spondylosis without myelopathy or radiculopathy, lumbosacral region: Secondary | ICD-10-CM | POA: Diagnosis not present

## 2013-05-20 DIAGNOSIS — M5137 Other intervertebral disc degeneration, lumbosacral region: Secondary | ICD-10-CM | POA: Diagnosis not present

## 2013-05-20 DIAGNOSIS — IMO0002 Reserved for concepts with insufficient information to code with codable children: Secondary | ICD-10-CM | POA: Diagnosis not present

## 2013-06-02 ENCOUNTER — Other Ambulatory Visit: Payer: Self-pay | Admitting: Neurosurgery

## 2013-06-02 DIAGNOSIS — M549 Dorsalgia, unspecified: Secondary | ICD-10-CM

## 2013-06-08 ENCOUNTER — Other Ambulatory Visit: Payer: Medicare Other

## 2013-06-11 ENCOUNTER — Other Ambulatory Visit: Payer: Medicare Other

## 2013-06-14 ENCOUNTER — Ambulatory Visit
Admission: RE | Admit: 2013-06-14 | Discharge: 2013-06-14 | Disposition: A | Discharge status: Home / Self Care | Payer: Medicare Other | Source: Ambulatory Visit | Attending: Neurosurgery | Admitting: Neurosurgery

## 2013-06-14 DIAGNOSIS — M48061 Spinal stenosis, lumbar region without neurogenic claudication: Secondary | ICD-10-CM | POA: Diagnosis not present

## 2013-06-14 DIAGNOSIS — M5137 Other intervertebral disc degeneration, lumbosacral region: Secondary | ICD-10-CM | POA: Diagnosis not present

## 2013-06-14 DIAGNOSIS — M4310 Spondylolisthesis, site unspecified: Secondary | ICD-10-CM | POA: Diagnosis not present

## 2013-06-14 DIAGNOSIS — M549 Dorsalgia, unspecified: Secondary | ICD-10-CM

## 2013-06-14 MED ORDER — GADOBENATE DIMEGLUMINE 529 MG/ML IV SOLN
15.0000 mL | Freq: Once | INTRAVENOUS | Status: AC | PRN
  Administered 2013-06-14: 15 mL via INTRAVENOUS

## 2013-06-17 ENCOUNTER — Ambulatory Visit: Payer: Medicare Other | Admitting: Endocrinology

## 2013-06-24 DIAGNOSIS — M48062 Spinal stenosis, lumbar region with neurogenic claudication: Secondary | ICD-10-CM | POA: Diagnosis not present

## 2013-06-24 DIAGNOSIS — M5137 Other intervertebral disc degeneration, lumbosacral region: Secondary | ICD-10-CM | POA: Diagnosis not present

## 2013-06-24 DIAGNOSIS — IMO0002 Reserved for concepts with insufficient information to code with codable children: Secondary | ICD-10-CM | POA: Diagnosis not present

## 2013-06-24 DIAGNOSIS — M47817 Spondylosis without myelopathy or radiculopathy, lumbosacral region: Secondary | ICD-10-CM | POA: Diagnosis not present

## 2013-07-05 ENCOUNTER — Other Ambulatory Visit: Payer: Self-pay | Admitting: Endocrinology

## 2013-07-05 ENCOUNTER — Other Ambulatory Visit: Payer: Medicare Other

## 2013-07-05 DIAGNOSIS — E119 Type 2 diabetes mellitus without complications: Secondary | ICD-10-CM | POA: Diagnosis not present

## 2013-07-05 LAB — BASIC METABOLIC PANEL
BUN: 12 mg/dL (ref 6–23)
CHLORIDE: 104 meq/L (ref 96–112)
CO2: 30 mEq/L (ref 19–32)
Calcium: 9.4 mg/dL (ref 8.4–10.5)
Creat: 0.91 mg/dL (ref 0.50–1.10)
GLUCOSE: 84 mg/dL (ref 70–99)
POTASSIUM: 3.9 meq/L (ref 3.5–5.3)
Sodium: 142 mEq/L (ref 135–145)

## 2013-07-05 LAB — HEMOGLOBIN A1C
HEMOGLOBIN A1C: 6.5 % — AB (ref <5.7)
Mean Plasma Glucose: 140 mg/dL — ABNORMAL HIGH (ref <117)

## 2013-07-06 DIAGNOSIS — H35059 Retinal neovascularization, unspecified, unspecified eye: Secondary | ICD-10-CM | POA: Diagnosis not present

## 2013-07-06 DIAGNOSIS — H353290 Exudative age-related macular degeneration, unspecified eye, stage unspecified: Secondary | ICD-10-CM | POA: Diagnosis not present

## 2013-07-07 ENCOUNTER — Ambulatory Visit: Payer: Medicare Other | Admitting: Endocrinology

## 2013-07-08 ENCOUNTER — Ambulatory Visit (INDEPENDENT_AMBULATORY_CARE_PROVIDER_SITE_OTHER): Payer: Medicare Other | Admitting: Endocrinology

## 2013-07-08 ENCOUNTER — Encounter: Payer: Self-pay | Admitting: Endocrinology

## 2013-07-08 VITALS — BP 124/74 | HR 81 | Temp 98.0°F | Resp 14 | Ht 66.0 in | Wt 160.0 lb

## 2013-07-08 DIAGNOSIS — R5383 Other fatigue: Secondary | ICD-10-CM

## 2013-07-08 DIAGNOSIS — I10 Essential (primary) hypertension: Secondary | ICD-10-CM | POA: Diagnosis not present

## 2013-07-08 DIAGNOSIS — IMO0001 Reserved for inherently not codable concepts without codable children: Secondary | ICD-10-CM

## 2013-07-08 DIAGNOSIS — R5381 Other malaise: Secondary | ICD-10-CM | POA: Diagnosis not present

## 2013-07-08 DIAGNOSIS — E785 Hyperlipidemia, unspecified: Secondary | ICD-10-CM

## 2013-07-08 DIAGNOSIS — Z794 Long term (current) use of insulin: Secondary | ICD-10-CM | POA: Insufficient documentation

## 2013-07-08 DIAGNOSIS — E118 Type 2 diabetes mellitus with unspecified complications: Secondary | ICD-10-CM | POA: Insufficient documentation

## 2013-07-08 DIAGNOSIS — E1165 Type 2 diabetes mellitus with hyperglycemia: Principal | ICD-10-CM

## 2013-07-08 HISTORY — DX: Reserved for inherently not codable concepts without codable children: IMO0001

## 2013-07-08 NOTE — Progress Notes (Signed)
Patient ID: [name], female   DOB: [birth year], 73 y.o.   MRN: [id number]    Reason for Appointment: Diabetes follow-up   History of Present Illness     Diagnosis: Type 2 diabetes mellitus, date of diagnosis: 1998.   Prior history: She had previously been treated with Byetta, Glumetza, Amaryl, Victoza and  Onglyza However because of inadequate control and intolerance to drugs she was finally given pre-meal insulin along with Amaryl, Glumetza eventually stopped because of side effects and also Lantus added  Recent history:  The insulin regimen is described as premeal rapid analog 3-4 ACB, 6-8 units at lunch and supper; Lantus insulin 8 units daily ACS.   The patient's diabetes  appears to be overall very well controlled with nearly normal readings at home However she is mostly checking blood sugars in the mornings She is still not sure about how much insulin to take for various meals even though we have discussed this; she is trying to go by the prescription instructions. She is eating variable meals and sometimes larger amounts at lunch   Has not been seen in followup since 10/14 Overall her blood sugars are fairly consistent in the morning but was getting mild hypoglycemia last month,: Has not changed her Lantus since then   Monitors blood glucose: 0.8 times a day  Glucometer: One Touch.  Blood Glucose readings:   PREMEAL Breakfast Lunch  PCL   PCS  Overall  Glucose range:  56-123   98-110   102   64, 109    Mean/median:      91    Hypoglycemia: None since 2/24, lowest reading of 56 at 9:30 a.m. last month  Meals: 3 meals per day at 10 am. 2 pm and 6-8 pm but inconsistent schedule. Will sometimes get protein at breakfast, usually eating relatively light lunch and small portions and carbohydrates .   Physical activity: exercise: walking a little Certified Diabetes Educator visit: Most recent: 7/13.  Dietician visit: Most recent: 3/13.   Wt Readings from Last 3  Encounters:  07/08/13 160 lb (72.576 kg)  02/18/13 161 lb 1.6 oz (73.074 kg)  10/29/12 157 lb 14.4 oz (71.623 kg)   The microalbumin has been tested, and the result is normal Complications: Neuropathy  LABS:  Lab Results  Component Value Date   HGBA1C 6.5* 07/05/2013   HGBA1C 6.3 02/11/2013   HGBA1C 6.2 10/27/2012   Lab Results  Component Value Date   MICROALBUR 2.3* 02/11/2013   LDLCALC 87 02/11/2013   CREATININE 0.91 07/05/2013     Orders Only on 07/05/2013  Component Date Value Ref Range Status  . Sodium 07/05/2013 142  135 - 145 mEq/L Final  . Potassium 07/05/2013 3.9  3.5 - 5.3 mEq/L Final  . Chloride 07/05/2013 104  96 - 112 mEq/L Final  . CO2 07/05/2013 30  19 - 32 mEq/L Final  . Glucose, Bld 07/05/2013 84  70 - 99 mg/dL Final  . BUN 07/05/2013 12  6 - 23 mg/dL Final  . Creat 07/05/2013 0.91  0.50 - 1.10 mg/dL Final  . Calcium 07/05/2013 9.4  8.4 - 10.5 mg/dL Final  . Hemoglobin A1C 07/05/2013 6.5* <5.7 % Final   Comment:  According to the ADA Clinical Practice Recommendations for 2011, when                          HbA1c is used as a screening test:                                                       >=6.5%   Diagnostic of Diabetes Mellitus                                     (if abnormal result is confirmed)                                                     5.7-6.4%   Increased risk of developing Diabetes Mellitus                                                     References:Diagnosis and Classification of Diabetes Mellitus,Diabetes                          [id number] 1):S62-S69 and Standards of Medical Care in                                  Diabetes - 2011,Diabetes [id number] (Suppl 1):S11-S61.                             . Mean Plasma Glucose 07/05/2013 140* <117 mg/dL Final      Medication List       This list is accurate as of: 07/08/13  1:37  PM.  Always use your most recent med list.               acyclovir 400 MG tablet  Commonly known as:  ZOVIRAX     calcium-vitamin D 500-200 MG-UNIT per tablet  Commonly known as:  OSCAL WITH D  Take 1 tablet by mouth daily.     CRESTOR 10 MG tablet  Generic drug:  rosuvastatin     fluticasone 50 MCG/ACT nasal spray  Commonly known as:  FLONASE     gabapentin 100 MG capsule  Commonly known as:  NEURONTIN  Take 100-300 mg by mouth 3 (three) times daily. Pt takes 100mg  twice daily and then 300mg  at bedtime     glimepiride 2 MG tablet  Commonly known as:  AMARYL  Take 4 mg by mouth daily before breakfast.     HUMALOG KWIKPEN 100 UNIT/ML KiwkPen  Generic drug:  insulin lispro  INJECT 6 UNITS UNDER THE SKIN BEFORE BREAKFAST, 10 UNITS BEFORE LUNCH AND 9 UNITS BEFORE SUPPER     HYDROcodone-acetaminophen 10-325 MG per tablet  Commonly known as:  NORCO  Take 1 tablet by mouth every 6 (six) hours as needed. For pain  ibuprofen 800 MG tablet  Commonly known as:  ADVIL,MOTRIN     Insulin Glargine 100 UNIT/ML Solostar Pen  Commonly known as:  LANTUS SOLOSTAR  Inject 8 units at bedtime     omeprazole 10 MG capsule  Commonly known as:  PRILOSEC  Take 10 mg by mouth daily.     PATANASE 0.6 % Soln  Generic drug:  Olopatadine HCl     potassium chloride SA 20 MEQ tablet  Commonly known as:  K-DUR,KLOR-CON  Take 1 tablet (20 mEq total) by mouth daily.     valsartan-hydrochlorothiazide 320-12.5 MG per tablet  Commonly known as:  DIOVAN-HCT  Take 1 tablet by mouth daily.     VITAMIN B-6 PO  Take 1 tablet by mouth daily.     VITAMIN B12 PO  Take 1 tablet by mouth daily.        Allergies:  Allergies  Allergen Reactions  . Adhesive [Tape] Rash  . Lipitor [Atorvastatin] Rash    Past Medical History  Diagnosis Date  . Complication of anesthesia     wakes up shaking  . Diabetes mellitus   . Heart murmur     sees Dr. Montez Morita, last seen- early 2012  . Asthma      has used inhaler in past for asthmatic bronchitis, last time- early 2012  . Sleep apnea     borderline sleep apnea, states she no longer uses, early 2012- stopped using   . GERD (gastroesophageal reflux disease)     occas. use of  Prilosec  . Neuromuscular disorder     lumbar radiculopathy, lumbago  . Fibromyalgia   . Arthritis   . Hypertension     02/2010- stress test /w PCP    Past Surgical History  Procedure Laterality Date  . Back surgery      2012  . Abdominal hysterectomy    . Adb.cyst      ovarian cyst  . Eye surgery      macular degeneration treatment - injections    Family History  Problem Relation Age of Onset  . Anesthesia problems Neg Hx   . Hypotension Neg Hx   . Malignant hyperthermia Neg Hx   . Pseudochol deficiency Neg Hx     Social History:  reports that she quit smoking about 14 years ago. She does not have any smokeless tobacco history on file. She reports that she drinks alcohol. She reports that she does not use illicit drugs.  ROS  She may have diarrhea    Hyperlipidemia:   The lipid abnormality consists of elevated LDL , high triglyceride and Is being treated by PCP now with Zetia and Lescol  HYPERTENSION:  Has been present for several years. this is treated by PCP  Gabapentin is being used for neuropathy and leg pain   She is possibly going to get back surgery again   Examination:   BP 124/74  Pulse 81  Temp(Src) 98 F (36.7 C)  Resp 14  Ht 5\' 6"  (1.676 m)  Wt 160 lb (72.576 kg)  BMI 25.84 kg/m2  SpO2 96%  Body mass index is 25.84 kg/(m^2).   No pedal edema  Assesment/Plan:   1. Diabetes type 2 with BMI of 26  The patient's diabetes control appears to be overall very well controlled with near blood sugars  A1c is excellent for her age and duration of diabetes at 6.5 She is still requiring small doses of insulin She is however not checking her blood sugars after  lunch and supper discussed needing to do this to help adjust her  mealtime dose Although she is not having fasting hypoglycemia recently still has low normal blood sugars on waking up  Changes recommended to the patient today:  Take less Humalog for smaller carbohydrate meals and may take only 3-4 units if eating very little carbohydrate at either lunch or dinner More blood sugars after lunch and supper to help assess Humalog doses Reduce Lantus by one unit at least  2. History of neuropathy: She can continue gabapentin as needed  3. Hypertension: Well controlled    4. Hyperlipidemia: Well controlled in the past, will recheck on the next visit  5. History of fatigue and family history of hypothyroidism: Will check TSH on next visit    Sila Sarsfield 07/08/2013, 1:37 PM

## 2013-07-08 NOTE — Patient Instructions (Signed)
Please check blood sugars at least half the time about 2 hours after any meal and as directed on waking up. Please bring blood sugar monitor to each visit  Lantus 7 units and keep am sugars 80-130

## 2013-07-19 DIAGNOSIS — M545 Low back pain, unspecified: Secondary | ICD-10-CM | POA: Diagnosis not present

## 2013-07-19 DIAGNOSIS — Z79899 Other long term (current) drug therapy: Secondary | ICD-10-CM | POA: Diagnosis not present

## 2013-07-20 ENCOUNTER — Other Ambulatory Visit: Payer: Self-pay | Admitting: Cardiology

## 2013-07-20 DIAGNOSIS — G4710 Hypersomnia, unspecified: Secondary | ICD-10-CM | POA: Diagnosis not present

## 2013-07-20 DIAGNOSIS — G4730 Sleep apnea, unspecified: Secondary | ICD-10-CM | POA: Diagnosis not present

## 2013-07-21 DIAGNOSIS — Z79899 Other long term (current) drug therapy: Secondary | ICD-10-CM | POA: Diagnosis not present

## 2013-07-21 DIAGNOSIS — Z5181 Encounter for therapeutic drug level monitoring: Secondary | ICD-10-CM | POA: Diagnosis not present

## 2013-07-26 ENCOUNTER — Other Ambulatory Visit: Payer: Self-pay | Admitting: Cardiology

## 2013-07-26 DIAGNOSIS — M8580 Other specified disorders of bone density and structure, unspecified site: Secondary | ICD-10-CM

## 2013-07-27 DIAGNOSIS — M48062 Spinal stenosis, lumbar region with neurogenic claudication: Secondary | ICD-10-CM | POA: Diagnosis not present

## 2013-07-27 DIAGNOSIS — IMO0002 Reserved for concepts with insufficient information to code with codable children: Secondary | ICD-10-CM | POA: Diagnosis not present

## 2013-07-27 DIAGNOSIS — M961 Postlaminectomy syndrome, not elsewhere classified: Secondary | ICD-10-CM | POA: Diagnosis not present

## 2013-07-27 DIAGNOSIS — M545 Low back pain, unspecified: Secondary | ICD-10-CM | POA: Diagnosis not present

## 2013-07-27 DIAGNOSIS — G894 Chronic pain syndrome: Secondary | ICD-10-CM | POA: Diagnosis not present

## 2013-07-28 ENCOUNTER — Telehealth: Payer: Self-pay | Admitting: Endocrinology

## 2013-07-28 NOTE — Telephone Encounter (Signed)
Please see below and advise.

## 2013-07-28 NOTE — Telephone Encounter (Signed)
Increase Lantus insulin to 12 units until morning sugar below 140, may take extra dose today also Increase HUMALOG insulin to 12-14 units before each meal until blood sugar below 200

## 2013-07-28 NOTE — Telephone Encounter (Signed)
Pts states she had an Epidural injection in her spine and her sugar is 328 now was 377  Please advise patient   Thank You :)

## 2013-07-28 NOTE — Telephone Encounter (Signed)
Noted, patient is aware. 

## 2013-07-29 DIAGNOSIS — M5137 Other intervertebral disc degeneration, lumbosacral region: Secondary | ICD-10-CM | POA: Diagnosis not present

## 2013-07-29 DIAGNOSIS — M47817 Spondylosis without myelopathy or radiculopathy, lumbosacral region: Secondary | ICD-10-CM | POA: Diagnosis not present

## 2013-07-29 DIAGNOSIS — M48062 Spinal stenosis, lumbar region with neurogenic claudication: Secondary | ICD-10-CM | POA: Diagnosis not present

## 2013-07-29 DIAGNOSIS — IMO0002 Reserved for concepts with insufficient information to code with codable children: Secondary | ICD-10-CM | POA: Diagnosis not present

## 2013-08-03 DIAGNOSIS — Z1231 Encounter for screening mammogram for malignant neoplasm of breast: Secondary | ICD-10-CM | POA: Diagnosis not present

## 2013-08-09 ENCOUNTER — Other Ambulatory Visit: Payer: Medicare Other

## 2013-08-10 DIAGNOSIS — IMO0002 Reserved for concepts with insufficient information to code with codable children: Secondary | ICD-10-CM | POA: Diagnosis not present

## 2013-08-10 DIAGNOSIS — G894 Chronic pain syndrome: Secondary | ICD-10-CM | POA: Diagnosis not present

## 2013-08-10 DIAGNOSIS — M48062 Spinal stenosis, lumbar region with neurogenic claudication: Secondary | ICD-10-CM | POA: Diagnosis not present

## 2013-08-10 DIAGNOSIS — M961 Postlaminectomy syndrome, not elsewhere classified: Secondary | ICD-10-CM | POA: Diagnosis not present

## 2013-08-23 DIAGNOSIS — M899 Disorder of bone, unspecified: Secondary | ICD-10-CM | POA: Diagnosis not present

## 2013-08-23 DIAGNOSIS — M949 Disorder of cartilage, unspecified: Secondary | ICD-10-CM | POA: Diagnosis not present

## 2013-08-23 DIAGNOSIS — Z8262 Family history of osteoporosis: Secondary | ICD-10-CM | POA: Diagnosis not present

## 2013-08-31 DIAGNOSIS — H35059 Retinal neovascularization, unspecified, unspecified eye: Secondary | ICD-10-CM | POA: Diagnosis not present

## 2013-08-31 DIAGNOSIS — H353290 Exudative age-related macular degeneration, unspecified eye, stage unspecified: Secondary | ICD-10-CM | POA: Diagnosis not present

## 2013-09-14 DIAGNOSIS — G4710 Hypersomnia, unspecified: Secondary | ICD-10-CM | POA: Diagnosis not present

## 2013-09-14 DIAGNOSIS — G4730 Sleep apnea, unspecified: Secondary | ICD-10-CM | POA: Diagnosis not present

## 2013-10-05 ENCOUNTER — Other Ambulatory Visit (INDEPENDENT_AMBULATORY_CARE_PROVIDER_SITE_OTHER): Payer: Medicare Other

## 2013-10-05 DIAGNOSIS — R5383 Other fatigue: Secondary | ICD-10-CM | POA: Diagnosis not present

## 2013-10-05 DIAGNOSIS — E1165 Type 2 diabetes mellitus with hyperglycemia: Principal | ICD-10-CM

## 2013-10-05 DIAGNOSIS — E785 Hyperlipidemia, unspecified: Secondary | ICD-10-CM

## 2013-10-05 DIAGNOSIS — IMO0001 Reserved for inherently not codable concepts without codable children: Secondary | ICD-10-CM

## 2013-10-05 DIAGNOSIS — R5381 Other malaise: Secondary | ICD-10-CM

## 2013-10-05 LAB — COMPREHENSIVE METABOLIC PANEL
ALBUMIN: 4.4 g/dL (ref 3.5–5.2)
ALT: 14 U/L (ref 0–35)
AST: 20 U/L (ref 0–37)
Alkaline Phosphatase: 46 U/L (ref 39–117)
BUN: 14 mg/dL (ref 6–23)
CHLORIDE: 104 meq/L (ref 96–112)
CO2: 24 mEq/L (ref 19–32)
Calcium: 9.3 mg/dL (ref 8.4–10.5)
Creatinine, Ser: 1 mg/dL (ref 0.4–1.2)
GFR: 71.39 mL/min (ref >60.00)
GLUCOSE: 160 mg/dL — AB (ref 70–99)
Potassium: 3.9 mEq/L (ref 3.5–5.1)
Sodium: 138 mEq/L (ref 135–145)
Total Bilirubin: 0.3 mg/dL (ref 0.2–1.2)
Total Protein: 6.7 g/dL (ref 6.0–8.3)

## 2013-10-05 LAB — LIPID PANEL
CHOLESTEROL: 215 mg/dL — AB (ref 0–200)
HDL: 48.5 mg/dL (ref >39.00)
LDL Cholesterol: 133 mg/dL — ABNORMAL HIGH (ref 0–99)
NonHDL: 166.5
TRIGLYCERIDES: 166 mg/dL — AB (ref 0.0–149.0)
Total CHOL/HDL Ratio: 4
VLDL: 33.2 mg/dL (ref 0.0–40.0)

## 2013-10-05 LAB — MICROALBUMIN / CREATININE URINE RATIO
Creatinine,U: 110 mg/dL
MICROALB/CREAT RATIO: 3.5 mg/g (ref 0.0–30.0)
Microalb, Ur: 3.9 mg/dL — ABNORMAL HIGH (ref 0.0–1.9)

## 2013-10-05 LAB — TSH: TSH: 2.26 u[IU]/mL (ref 0.35–4.50)

## 2013-10-05 LAB — HEMOGLOBIN A1C: Hgb A1c MFr Bld: 7.4 % — ABNORMAL HIGH (ref 4.6–6.5)

## 2013-10-08 ENCOUNTER — Ambulatory Visit: Payer: Medicare Other | Admitting: Endocrinology

## 2013-10-08 ENCOUNTER — Encounter: Payer: Self-pay | Admitting: Endocrinology

## 2013-10-08 ENCOUNTER — Ambulatory Visit (INDEPENDENT_AMBULATORY_CARE_PROVIDER_SITE_OTHER): Payer: Medicare Other | Admitting: Endocrinology

## 2013-10-08 VITALS — BP 138/82 | HR 75 | Temp 97.9°F | Resp 16 | Ht 66.0 in | Wt 165.6 lb

## 2013-10-08 DIAGNOSIS — E1165 Type 2 diabetes mellitus with hyperglycemia: Principal | ICD-10-CM

## 2013-10-08 DIAGNOSIS — IMO0001 Reserved for inherently not codable concepts without codable children: Secondary | ICD-10-CM | POA: Diagnosis not present

## 2013-10-08 DIAGNOSIS — E785 Hyperlipidemia, unspecified: Secondary | ICD-10-CM

## 2013-10-08 DIAGNOSIS — I10 Essential (primary) hypertension: Secondary | ICD-10-CM | POA: Diagnosis not present

## 2013-10-08 NOTE — Patient Instructions (Addendum)
Amaryl 2mg  twice daily with meals  Crestor daily or 20 mg  every 2 days  Please check blood sugars at least half the time about 2 hours after any meal and 3-4 times per week on waking up. Please bring blood sugar monitor to each visit

## 2013-10-08 NOTE — Progress Notes (Signed)
Patient ID: [name], female   DOB: [birth year], 73 y.o.   MRN: [id number]    Reason for Appointment: Diabetes follow-up   History of Present Illness     Diagnosis: Type 2 diabetes mellitus, date of diagnosis: 1998.   Prior history: She had previously been treated with Byetta, Glumetza, Amaryl, Victoza and  Onglyza However because of inadequate control and intolerance to drugs she was finally given pre-meal insulin along with Amaryl, Glumetza eventually stopped because of side effects and also Lantus added  Recent history:  The insulin regimen is: premeal rapid analog 6 ACB,  8 units at lunch and 8-10 supper; Lantus insulin 12 units daily ACS.   The patient's diabetes  appears to be somewhat worse since her last visit in 3/15 Her blood sugars went up after getting epidural steroid and she continues to require higher doses of insulin compared to previously However again she is mostly checking blood sugars in the mornings Her A1c is gradually increasing since last year Again her mealtimes are somewhat variable and she had no readings after dinner. She did have a high reading after lunch last weekend probably from a larger meal Not adjusting the insulin much based on her meal size Also not able to be very active and is gaining some weight again Currently taking Amaryl only in the evening She is still not sure about how much insulin to take for various meals even though we have discussed this; she is trying to go by the prescription instructions. She is eating variable meals and sometimes larger amounts at lunch   Has not changed her Lantus since she was instructed on increasing it with higher sugars   Monitors blood glucose: 0.9 times a day  Glucometer: One Touch.  Blood Glucose readings:   PREMEAL Breakfast Lunch  PCL  Bedtime Overall  Glucose range:  97-165   174   238     Median:  136      142    Hypoglycemia: None recently  Meals: 3 meals per day at 10 am. 2 pm and 6-8 pm  but inconsistent schedule. Will sometimes get protein at breakfast,   Physical activity: exercise: water aerobics Certified Diabetes Educator visit: Most recent: 7/13.  Dietician visit: Most recent: 3/13.   Wt Readings from Last 3 Encounters:  10/08/13 165 lb 9.6 oz (75.116 kg)  07/08/13 160 lb (72.576 kg)  02/18/13 161 lb 1.6 oz (73.074 kg)   The microalbumin has been tested, and the result is normal Complications: Neuropathy  LABS:  Lab Results  Component Value Date   HGBA1C 7.4* 10/05/2013   HGBA1C 6.5* 07/05/2013   HGBA1C 6.3 02/11/2013   Lab Results  Component Value Date   MICROALBUR 3.9* 10/05/2013   LDLCALC 133* 10/05/2013   CREATININE 1.0 10/05/2013     Appointment on 10/05/2013  Component Date Value Ref Range Status  . Hemoglobin A1C 10/05/2013 7.4* 4.6 - 6.5 % Final   Glycemic Control Guidelines for People with Diabetes:Non Diabetic:  <6%Goal of Therapy: <7%Additional Action Suggested:  >8%   . Sodium 10/05/2013 138  135 - 145 mEq/L Final  . Potassium 10/05/2013 3.9  3.5 - 5.1 mEq/L Final  . Chloride 10/05/2013 104  96 - 112 mEq/L Final  . CO2 10/05/2013 24  19 - 32 mEq/L Final  . Glucose, Bld 10/05/2013 160* 70 - 99 mg/dL Final  . BUN 10/05/2013 14  6 - 23 mg/dL Final  . Creatinine, Ser 10/05/2013 1.0  0.4 -  1.2 mg/dL Final  . Total Bilirubin 10/05/2013 0.3  0.2 - 1.2 mg/dL Final  . Alkaline Phosphatase 10/05/2013 46  39 - 117 U/L Final  . AST 10/05/2013 20  0 - 37 U/L Final  . ALT 10/05/2013 14  0 - 35 U/L Final  . Total Protein 10/05/2013 6.7  6.0 - 8.3 g/dL Final  . Albumin 10/05/2013 4.4  3.5 - 5.2 g/dL Final  . Calcium 10/05/2013 9.3  8.4 - 10.5 mg/dL Final  . GFR 10/05/2013 71.39  >60.00 mL/min Final  . Cholesterol 10/05/2013 215* 0 - 200 mg/dL Final   ATP III Classification       Desirable:  < 200 mg/dL               Borderline High:  200 - 239 mg/dL          High:  > = 240 mg/dL  . Triglycerides 10/05/2013 166.0* 0.0 - 149.0 mg/dL Final   Normal:   <150 mg/dLBorderline High:  150 - 199 mg/dL  . HDL 10/05/2013 48.50  >39.00 mg/dL Final  . VLDL 10/05/2013 33.2  0.0 - 40.0 mg/dL Final  . LDL Cholesterol 10/05/2013 133* 0 - 99 mg/dL Final  . Total CHOL/HDL Ratio 10/05/2013 4   Final                  Men          Women1/2 Average Risk     3.4          3.3Average Risk          5.0          4.42X Average Risk          9.6          7.13X Average Risk          15.0          11.0                      . NonHDL 10/05/2013 166.50   Final  . Microalb, Ur 10/05/2013 3.9* 0.0 - 1.9 mg/dL Final  . Creatinine,U 10/05/2013 110.0   Final  . Microalb Creat Ratio 10/05/2013 3.5  0.0 - 30.0 mg/g Final  . TSH 10/05/2013 2.26  0.35 - 4.50 uIU/mL Final      Medication List       This list is accurate as of: 10/08/13  1:30 PM.  Always use your most recent med list.               acyclovir 400 MG tablet  Commonly known as:  ZOVIRAX     alendronate 70 MG tablet  Commonly known as:  FOSAMAX     calcium-vitamin D 500-200 MG-UNIT per tablet  Commonly known as:  OSCAL WITH D  Take 1 tablet by mouth daily.     CRESTOR 10 MG tablet  Generic drug:  rosuvastatin     diazepam 5 MG tablet  Commonly known as:  VALIUM     fluticasone 50 MCG/ACT nasal spray  Commonly known as:  FLONASE     gabapentin 100 MG capsule  Commonly known as:  NEURONTIN  Take 100-300 mg by mouth 3 (three) times daily. Pt takes 100mg  twice daily and then 300mg  at bedtime     glimepiride 2 MG tablet  Commonly known as:  AMARYL  Take 2 mg by mouth daily before supper.     HUMALOG KWIKPEN 100 UNIT/ML  KiwkPen  Generic drug:  insulin lispro  INJECT 6 UNITS UNDER THE SKIN BEFORE BREAKFAST, 10 UNITS BEFORE LUNCH AND 9 UNITS BEFORE SUPPER     HYDROcodone-acetaminophen 10-325 MG per tablet  Commonly known as:  NORCO  Take 1 tablet by mouth every 6 (six) hours as needed. For pain     ibuprofen 800 MG tablet  Commonly known as:  ADVIL,MOTRIN     Insulin Glargine 100 UNIT/ML  Solostar Pen  Commonly known as:  LANTUS SOLOSTAR  Inject 8 units at bedtime     LYRICA 50 MG capsule  Generic drug:  pregabalin     omeprazole 10 MG capsule  Commonly known as:  PRILOSEC  Take 10 mg by mouth daily.     PATANASE 0.6 % Soln  Generic drug:  Olopatadine HCl     potassium chloride SA 20 MEQ tablet  Commonly known as:  K-DUR,KLOR-CON  Take 1 tablet (20 mEq total) by mouth daily.     valsartan-hydrochlorothiazide 320-12.5 MG per tablet  Commonly known as:  DIOVAN-HCT  Take 1 tablet by mouth daily.     VITAMIN B-6 PO  Take 1 tablet by mouth daily.     VITAMIN B12 PO  Take 1 tablet by mouth daily.        Allergies:  Allergies  Allergen Reactions  . Adhesive [Tape] Rash  . Lipitor [Atorvastatin] Rash    Past Medical History  Diagnosis Date  . Complication of anesthesia     wakes up shaking  . Diabetes mellitus   . Heart murmur     sees Dr. Montez Morita, last seen- early 2012  . Asthma     has used inhaler in past for asthmatic bronchitis, last time- early 2012  . Sleep apnea     borderline sleep apnea, states she no longer uses, early 2012- stopped using   . GERD (gastroesophageal reflux disease)     occas. use of  Prilosec  . Neuromuscular disorder     lumbar radiculopathy, lumbago  . Fibromyalgia   . Arthritis   . Hypertension     02/2010- stress test /w PCP    Past Surgical History  Procedure Laterality Date  . Back surgery      2012  . Abdominal hysterectomy    . Adb.cyst      ovarian cyst  . Eye surgery      macular degeneration treatment - injections    Family History  Problem Relation Age of Onset  . Anesthesia problems Neg Hx   . Hypotension Neg Hx   . Malignant hyperthermia Neg Hx   . Pseudochol deficiency Neg Hx     Social History:  reports that she quit smoking about 14 years ago. She does not have any smokeless tobacco history on file. She reports that she drinks alcohol. She reports that she does not use illicit  drugs.  ROS  Hyperlipidemia:   The lipid abnormality consists of elevated LDL , high triglyceride and Is being treated by PCP now with Crestor but she is not taking it daily because of the cost. Previously had been on Lescol  Lab Results  Component Value Date   CHOL 215* 10/05/2013   HDL 48.50 10/05/2013   LDLCALC 133* 10/05/2013   LDLDIRECT 153.2 10/27/2012   TRIG 166.0* 10/05/2013   CHOLHDL 4 10/05/2013     HYPERTENSION:  Has been present for several years. this is treated by PCP and reasonably well-controlled  Now she is on Lyrica for  neuropathy and leg pain   She is possibly going to get epidural steroid again, she is waiting for her sugars to be controlled   Examination:   BP 138/82  Pulse 75  Temp(Src) 97.9 F (36.6 C)  Resp 16  Ht 5\' 6"  (1.676 m)  Wt 165 lb 9.6 oz (75.116 kg)  BMI 26.74 kg/m2  SpO2 94%  Body mass index is 26.74 kg/(m^2).   No pedal edema  Assesment/Plan:   1. Diabetes type 2 with BMI of 26  The patient's diabetes control appears to be somewhat more difficult especially with her getting less active and having had an epidural steroid earlier this year However she appears to be requiring more insulin and A1c is higher May be needing more insulin for her meals especially when eating out or eating low carbohydrate Again discussed the compliance with glucose monitoring, mealtime insulin adjustment based on meal size, blood sugar targets, A1c results She is a good candidate for adding Invokana but she does not want to use a brand name medication Had been intolerant to Victoza  2. Hyperlipidemia: She has been out of her Crestor because of cost  Recommendations made to the patient today:   May try Amaryl twice a day  Better compliance with the above measures 4 day-to-day diabetes care  Call if blood sugar not improve  May have epidural steroid but will need to increase both Humalog and Lantus by 2-6 units when blood sugars are high and will have to  check more frequently after the injection  Increase exercise as tolerated  Take Crestor every other day  May need to add 2 units of Lantus morning fasting blood sugars continue to be high  Counseling time over 50% of today's 25 minute visit   KUMAR,AJAY 10/08/2013, 1:30 PM

## 2013-10-09 ENCOUNTER — Other Ambulatory Visit: Payer: Self-pay | Admitting: Endocrinology

## 2013-11-01 DIAGNOSIS — G4710 Hypersomnia, unspecified: Secondary | ICD-10-CM | POA: Diagnosis not present

## 2013-11-01 DIAGNOSIS — G4730 Sleep apnea, unspecified: Secondary | ICD-10-CM | POA: Diagnosis not present

## 2013-11-17 DIAGNOSIS — H353290 Exudative age-related macular degeneration, unspecified eye, stage unspecified: Secondary | ICD-10-CM | POA: Diagnosis not present

## 2013-11-17 DIAGNOSIS — H35059 Retinal neovascularization, unspecified, unspecified eye: Secondary | ICD-10-CM | POA: Diagnosis not present

## 2013-11-18 ENCOUNTER — Ambulatory Visit (INDEPENDENT_AMBULATORY_CARE_PROVIDER_SITE_OTHER): Payer: Medicare Other | Admitting: Endocrinology

## 2013-11-18 ENCOUNTER — Other Ambulatory Visit: Payer: Self-pay | Admitting: *Deleted

## 2013-11-18 ENCOUNTER — Encounter: Payer: Self-pay | Admitting: Endocrinology

## 2013-11-18 VITALS — BP 154/74 | HR 76 | Temp 98.7°F | Resp 12 | Wt 168.0 lb

## 2013-11-18 DIAGNOSIS — I10 Essential (primary) hypertension: Secondary | ICD-10-CM | POA: Diagnosis not present

## 2013-11-18 DIAGNOSIS — E1165 Type 2 diabetes mellitus with hyperglycemia: Principal | ICD-10-CM

## 2013-11-18 DIAGNOSIS — IMO0001 Reserved for inherently not codable concepts without codable children: Secondary | ICD-10-CM | POA: Diagnosis not present

## 2013-11-18 MED ORDER — CANAGLIFLOZIN 100 MG PO TABS: ORAL_TABLET | ORAL | Status: DC

## 2013-11-18 NOTE — Progress Notes (Signed)
Patient ID: [name], female   DOB: 02/01/1941, 73 y.o.   MRN: [id number]    Reason for Appointment: Diabetes follow-up   History of Present Illness     Diagnosis: Type 2 diabetes mellitus, date of diagnosis: 1998.   Prior history: She had previously been treated with Byetta, Glumetza, Amaryl, Victoza and  Onglyza However because of inadequate control and intolerance to drugs she was finally given pre-meal insulin along with Amaryl, Glumetza eventually stopped because of side effects and also Lantus added  Recent history:  The insulin regimen is: premeal rapid analog 6 ACB,  10 units at lunch and 9 supper; Lantus insulin 12 units daily ACS.   The patient's diabetes control is not as good on her last visit in 6/15 with rising A1c and effects of epidural steroids She will not willing to start Invokana on her last visit Her Amaryl was increased to twice a day and is now on 4 mg twice a day Her blood sugars are more variable now although on average about the same Again her diet is somewhat inconsistent and occasionally having higher fat meals or snacks including at night Her A1c is gradually increasing since last year She is also quite concerned about her tendency to gain weight Had previously tried Victoza which caused nausea   Monitors blood glucose: 0.9 times a day  Glucometer: One Touch.  Blood Glucose readings:   PREMEAL Breakfast Lunch  2-4 PM   5-8 PM  Overall  Glucose range:  92-220   106-166   95-245   74-236    Mean/median:  135    137   144   133    Hypoglycemia: None recently  Meals: 3 meals per day at 10 am. 2 pm and 6-8 pm but inconsistent schedule. Will sometimes get protein at breakfast. Her snacks may be popcorn or chips   Physical activity: exercise: water aerobics Certified Diabetes Educator visit: Most recent: 7/13.  Dietician visit: Most recent: 3/13.   Wt Readings from Last 3 Encounters:  11/18/13 168 lb (76.204 kg)  10/08/13 165 lb 9.6 oz (75.116  kg)  07/08/13 160 lb (72.576 kg)   The microalbumin has been tested, and the result is normal Complications: Neuropathy  LABS:  Lab Results  Component Value Date   HGBA1C 7.4* 10/05/2013   HGBA1C 6.5* 07/05/2013   HGBA1C 6.3 02/11/2013   Lab Results  Component Value Date   MICROALBUR 3.9* 10/05/2013   LDLCALC 133* 10/05/2013   CREATININE 1.0 10/05/2013     No visits with results within 1 Week(s) from this visit. Latest known visit with results is:  Appointment on 10/05/2013  Component Date Value Ref Range Status  . Hemoglobin A1C 10/05/2013 7.4* 4.6 - 6.5 % Final   Glycemic Control Guidelines for People with Diabetes:Non Diabetic:  <6%Goal of Therapy: <7%Additional Action Suggested:  >8%   . Sodium 10/05/2013 138  135 - 145 mEq/L Final  . Potassium 10/05/2013 3.9  3.5 - 5.1 mEq/L Final  . Chloride 10/05/2013 104  96 - 112 mEq/L Final  . CO2 10/05/2013 24  19 - 32 mEq/L Final  . Glucose, Bld 10/05/2013 160* 70 - 99 mg/dL Final  . BUN 10/05/2013 14  6 - 23 mg/dL Final  . Creatinine, Ser 10/05/2013 1.0  0.4 - 1.2 mg/dL Final  . Total Bilirubin 10/05/2013 0.3  0.2 - 1.2 mg/dL Final  . Alkaline Phosphatase 10/05/2013 46  39 - 117 U/L Final  . AST 10/05/2013 20  0 - 37 U/L Final  . ALT 10/05/2013 14  0 - 35 U/L Final  . Total Protein 10/05/2013 6.7  6.0 - 8.3 g/dL Final  . Albumin 10/05/2013 4.4  3.5 - 5.2 g/dL Final  . Calcium 10/05/2013 9.3  8.4 - 10.5 mg/dL Final  . GFR 10/05/2013 71.39  >60.00 mL/min Final  . Cholesterol 10/05/2013 215* 0 - 200 mg/dL Final   ATP III Classification       Desirable:  < 200 mg/dL               Borderline High:  200 - 239 mg/dL          High:  > = 240 mg/dL  . Triglycerides 10/05/2013 166.0* 0.0 - 149.0 mg/dL Final   Normal:  <150 mg/dLBorderline High:  150 - 199 mg/dL  . HDL 10/05/2013 48.50  >39.00 mg/dL Final  . VLDL 10/05/2013 33.2  0.0 - 40.0 mg/dL Final  . LDL Cholesterol 10/05/2013 133* 0 - 99 mg/dL Final  . Total CHOL/HDL Ratio  10/05/2013 4   Final                  Men          Women1/2 Average Risk     3.4          3.3Average Risk          5.0          4.42X Average Risk          9.6          7.13X Average Risk          15.0          11.0                      . NonHDL 10/05/2013 166.50   Final  . Microalb, Ur 10/05/2013 3.9* 0.0 - 1.9 mg/dL Final  . Creatinine,U 10/05/2013 110.0   Final  . Microalb Creat Ratio 10/05/2013 3.5  0.0 - 30.0 mg/g Final  . TSH 10/05/2013 2.26  0.35 - 4.50 uIU/mL Final      Medication List       This list is accurate as of: 11/18/13  3:34 PM.  Always use your most recent med list.               acyclovir 400 MG tablet  Commonly known as:  ZOVIRAX     alendronate 70 MG tablet  Commonly known as:  FOSAMAX     calcium-vitamin D 500-200 MG-UNIT per tablet  Commonly known as:  OSCAL WITH D  Take 1 tablet by mouth daily.     CRESTOR 10 MG tablet  Generic drug:  rosuvastatin     diazepam 5 MG tablet  Commonly known as:  VALIUM     fluticasone 50 MCG/ACT nasal spray  Commonly known as:  FLONASE     gabapentin 100 MG capsule  Commonly known as:  NEURONTIN  Take 100-300 mg by mouth 3 (three) times daily. Pt takes 100mg  twice daily and then 300mg  at bedtime     glimepiride 2 MG tablet  Commonly known as:  AMARYL  TAKE 2 TABLETS BY MOUTH DAILY IN THE MORNING AND 2 TABLETS BY MOUTH DAILY IN THE EVENING     HUMALOG KWIKPEN 100 UNIT/ML KiwkPen  Generic drug:  insulin lispro  INJECT 6 UNITS UNDER THE SKIN BEFORE BREAKFAST, 10 UNITS BEFORE LUNCH AND 9 UNITS  BEFORE SUPPER     HYDROcodone-acetaminophen 10-325 MG per tablet  Commonly known as:  NORCO  Take 1 tablet by mouth every 6 (six) hours as needed. For pain     ibuprofen 800 MG tablet  Commonly known as:  ADVIL,MOTRIN     Insulin Glargine 100 UNIT/ML Solostar Pen  Commonly known as:  LANTUS SOLOSTAR  Inject 8 units at bedtime     LYRICA 50 MG capsule  Generic drug:  pregabalin     omeprazole 10 MG capsule   Commonly known as:  PRILOSEC  Take 10 mg by mouth daily.     PATANASE 0.6 % Soln  Generic drug:  Olopatadine HCl     potassium chloride SA 20 MEQ tablet  Commonly known as:  K-DUR,KLOR-CON  Take 1 tablet (20 mEq total) by mouth daily.     valsartan-hydrochlorothiazide 320-12.5 MG per tablet  Commonly known as:  DIOVAN-HCT  Take 1 tablet by mouth daily.     VITAMIN B-6 PO  Take 1 tablet by mouth daily.     VITAMIN B12 PO  Take 1 tablet by mouth daily.        Allergies:  Allergies  Allergen Reactions  . Adhesive [Tape] Rash  . Lipitor [Atorvastatin] Rash    Past Medical History  Diagnosis Date  . Complication of anesthesia     wakes up shaking  . Diabetes mellitus   . Heart murmur     sees Dr. Montez Morita, last seen- early 2012  . Asthma     has used inhaler in past for asthmatic bronchitis, last time- early 2012  . Sleep apnea     borderline sleep apnea, states she no longer uses, early 2012- stopped using   . GERD (gastroesophageal reflux disease)     occas. use of  Prilosec  . Neuromuscular disorder     lumbar radiculopathy, lumbago  . Fibromyalgia   . Arthritis   . Hypertension     02/2010- stress test /w PCP    Past Surgical History  Procedure Laterality Date  . Back surgery      2012  . Abdominal hysterectomy    . Adb.cyst      ovarian cyst  . Eye surgery      macular degeneration treatment - injections    Family History  Problem Relation Age of Onset  . Anesthesia problems Neg Hx   . Hypotension Neg Hx   . Malignant hyperthermia Neg Hx   . Pseudochol deficiency Neg Hx     Social History:  reports that she quit smoking about 14 years ago. She does not have any smokeless tobacco history on file. She reports that she drinks alcohol. She reports that she does not use illicit drugs.  ROS  Hyperlipidemia:   The lipid abnormality consists of elevated LDL , high triglyceride and Is being treated by PCP now with Crestor She was asked to take this  every other day because  of the cost; previously was taking it very irregularly. Previously had been on Lescol  Lab Results  Component Value Date   CHOL 215* 10/05/2013   HDL 48.50 10/05/2013   LDLCALC 133* 10/05/2013   LDLDIRECT 153.2 10/27/2012   TRIG 166.0* 10/05/2013   CHOLHDL 4 10/05/2013     HYPERTENSION:  Has been present for several years. this is treated by PCP Not clear why her blood pressure is higher today. She has not monitored at home. Currently only on Diovan HCT  Now she is  on Lyrica for neuropathy and leg pain and she has been needing larger doses   She is possibly going to get epidural steroid again    Examination:   BP 154/74  Pulse 76  Temp(Src) 98.7 F (37.1 C) (Oral)  Resp 12  Wt 168 lb (76.204 kg)  SpO2 97%  Body mass index is 27.13 kg/(m^2).   Repeat blood pressure 150/78 No pedal edema  Assesment/Plan:   1. Diabetes type 2 with BMI of 27  The patient's diabetes control appears to be somewhat more difficult especially with her being inconsistent with diet Although she is trying to do some water aerobics for exercise she is gaining weight partly from taking Lyrica Also requiring more insulin She has had difficulty tolerating medication like metformin and Victoza she was recommended Invokana Discussed action of SGLT 2 drugs on lowering glucose by decreasing kidney absorption of glucose, benefits of weight loss and lower blood pressure, possible side effects including candidiasis and dosage regimen   2. Hypertension: Blood pressure is relatively higher today  Recommendations made to the patient today:   Continue Amaryl twice a day  Better compliance with diet and reduce high-fat foods and snacks  Invokana 100 mg daily  Call if blood sugar control is not consistent  May need to add 2 units of Lantus if they morning fasting blood sugars continue to be high  More consistent glucose monitoring especially after supper  Increase frequency of  exercise  Get new blood pressure monitor for home testing  Followup A1c on the next visit  Counseling time over 50% of today's 25 minute visit   Yannick Steuber 11/18/2013, 3:34 PM

## 2013-11-18 NOTE — Patient Instructions (Addendum)
Glimeperide 1 in am and 2 in pm  Invokana 100mg  in am  Reduce all doses by 2-4 units on 2nd day of Invokana

## 2013-12-10 ENCOUNTER — Other Ambulatory Visit (INDEPENDENT_AMBULATORY_CARE_PROVIDER_SITE_OTHER): Payer: Medicare Other

## 2013-12-10 DIAGNOSIS — IMO0001 Reserved for inherently not codable concepts without codable children: Secondary | ICD-10-CM | POA: Diagnosis not present

## 2013-12-10 DIAGNOSIS — E1165 Type 2 diabetes mellitus with hyperglycemia: Principal | ICD-10-CM

## 2013-12-11 LAB — BASIC METABOLIC PANEL
BUN: 19 mg/dL (ref 6–23)
CO2: 26 mEq/L (ref 19–32)
Calcium: 9.3 mg/dL (ref 8.4–10.5)
Chloride: 103 mEq/L (ref 96–112)
Creatinine, Ser: 1.1 mg/dL (ref 0.4–1.2)
GFR: 61.16 mL/min (ref >60.00)
Glucose, Bld: 111 mg/dL — ABNORMAL HIGH (ref 70–99)
Potassium: 3.6 mEq/L (ref 3.5–5.1)
SODIUM: 141 meq/L (ref 135–145)

## 2013-12-11 LAB — HEMOGLOBIN A1C: HEMOGLOBIN A1C: 7.1 % — AB (ref 4.6–6.5)

## 2013-12-15 ENCOUNTER — Ambulatory Visit (INDEPENDENT_AMBULATORY_CARE_PROVIDER_SITE_OTHER): Payer: Medicare Other | Admitting: Endocrinology

## 2013-12-15 ENCOUNTER — Encounter: Payer: Self-pay | Admitting: Endocrinology

## 2013-12-15 VITALS — BP 133/74 | HR 76 | Temp 98.2°F | Resp 16 | Ht 66.0 in | Wt 168.2 lb

## 2013-12-15 DIAGNOSIS — IMO0001 Reserved for inherently not codable concepts without codable children: Secondary | ICD-10-CM | POA: Diagnosis not present

## 2013-12-15 DIAGNOSIS — E1165 Type 2 diabetes mellitus with hyperglycemia: Principal | ICD-10-CM

## 2013-12-15 DIAGNOSIS — I10 Essential (primary) hypertension: Secondary | ICD-10-CM

## 2013-12-15 DIAGNOSIS — E785 Hyperlipidemia, unspecified: Secondary | ICD-10-CM

## 2013-12-15 NOTE — Progress Notes (Signed)
Patient ID: [name], female   DOB: [birth year], 73 y.o.   MRN: [id number]    Reason for Appointment: Diabetes follow-up   History of Present Illness     Diagnosis: Type 2 diabetes mellitus, date of diagnosis: 1998.   Prior history: She had previously been treated with Byetta, Glumetza, Amaryl, Victoza and  Onglyza However because of inadequate control and intolerance to drugs she was finally given pre-meal insulin along with Amaryl, Glumetza eventually stopped because of side effects and also Lantus added  Recent history:  The insulin regimen is: premeal rapid analog 4-6 ACB, 6-8 units at lunch and 6-8 supper; Lantus insulin 10 units daily hs.   The patient's diabetes control is better with starting Invokana which she finally agreed to She has not had any side effects but is asking about generalized itching She has reduced her mealtime insulin somewhat and her Lantus by 2 units However her blood sugars are somewhat erratic and has occasional high readings Blood sugars are also occasionally getting low late morning or early afternoon and also once overnight Previously was also concerned about difficulty losing weight; however her weight has not come down Also taking Amaryl Had previously tried Victoza which caused nausea   Monitors blood glucose: 1.8 times a day  Glucometer: One Touch.  Blood Glucose readings are being checked somewhat sporadically but mostly in the mornings Overall median blood sugar is 113:   PREMEAL Breakfast  10AM-1PM  Dinner  7 PM   overnight   Glucose range:  88-182   61-210   222   92-169   69, 113   Mean/median:        Hypoglycemia: mild as above, not clear if late morning low sugars are before breakfast, has 2 low readings after lunch  Meals: 3 meals per day at 10 am. 2 pm and 6-8 pm but inconsistent schedule.  Will sometimes get protein at breakfast. Her snacks may be popcorn or chips   Physical activity: exercise: water aerobics Certified  Diabetes Educator visit: Most recent: 7/13.  Dietician visit: Most recent: 3/13.   Wt Readings from Last 3 Encounters:  12/15/13 168 lb 3.2 oz (76.295 kg)  11/18/13 168 lb (76.204 kg)  10/08/13 165 lb 9.6 oz (75.116 kg)   The microalbumin has been tested, and the result is normal Complications: Neuropathy  LABS:  Lab Results  Component Value Date   HGBA1C 7.1* 12/10/2013   HGBA1C 7.4* 10/05/2013   HGBA1C 6.5* 07/05/2013   Lab Results  Component Value Date   MICROALBUR 3.9* 10/05/2013   LDLCALC 133* 10/05/2013   CREATININE 1.1 12/10/2013     Appointment on 12/10/2013  Component Date Value Ref Range Status  . Hemoglobin A1C 12/10/2013 7.1* 4.6 - 6.5 % Final   Glycemic Control Guidelines for People with Diabetes:Non Diabetic:  <6%Goal of Therapy: <7%Additional Action Suggested:  >8%   . Sodium 12/10/2013 141  135 - 145 mEq/L Final  . Potassium 12/10/2013 3.6  3.5 - 5.1 mEq/L Final  . Chloride 12/10/2013 103  96 - 112 mEq/L Final  . CO2 12/10/2013 26  19 - 32 mEq/L Final  . Glucose, Bld 12/10/2013 111* 70 - 99 mg/dL Final  . BUN 12/10/2013 19  6 - 23 mg/dL Final  . Creatinine, Ser 12/10/2013 1.1  0.4 - 1.2 mg/dL Final  . Calcium 12/10/2013 9.3  8.4 - 10.5 mg/dL Final  . GFR 12/10/2013 61.16  >60.00 mL/min Final      Medication List  This list is accurate as of: 12/15/13  2:06 PM.  Always use your most recent med list.               acyclovir 400 MG tablet  Commonly known as:  ZOVIRAX     alendronate 70 MG tablet  Commonly known as:  FOSAMAX     calcium-vitamin D 500-200 MG-UNIT per tablet  Commonly known as:  OSCAL WITH D  Take 1 tablet by mouth daily.     Canagliflozin 100 MG Tabs  Commonly known as:  INVOKANA  Take 1 tablet daily     CRESTOR 10 MG tablet  Generic drug:  rosuvastatin     diazepam 5 MG tablet  Commonly known as:  VALIUM     fluticasone 50 MCG/ACT nasal spray  Commonly known as:  FLONASE     gabapentin 100 MG capsule  Commonly  known as:  NEURONTIN  Take 100-300 mg by mouth 3 (three) times daily. Pt takes 100mg  twice daily and then 300mg  at bedtime     glimepiride 2 MG tablet  Commonly known as:  AMARYL  TAKE 2 TABLETS BY MOUTH DAILY IN THE MORNING AND 2 TABLETS BY MOUTH DAILY IN THE EVENING     HUMALOG KWIKPEN 100 UNIT/ML KiwkPen  Generic drug:  insulin lispro  INJECT 6 UNITS UNDER THE SKIN BEFORE BREAKFAST, 10 UNITS BEFORE LUNCH AND 9 UNITS BEFORE SUPPER     HYDROcodone-acetaminophen 10-325 MG per tablet  Commonly known as:  NORCO  Take 1 tablet by mouth every 6 (six) hours as needed. For pain     ibuprofen 800 MG tablet  Commonly known as:  ADVIL,MOTRIN     Insulin Glargine 100 UNIT/ML Solostar Pen  Commonly known as:  LANTUS SOLOSTAR  Inject 8 units at bedtime     LYRICA 50 MG capsule  Generic drug:  pregabalin     omeprazole 10 MG capsule  Commonly known as:  PRILOSEC  Take 10 mg by mouth daily.     PATANASE 0.6 % Soln  Generic drug:  Olopatadine HCl     potassium chloride SA 20 MEQ tablet  Commonly known as:  K-DUR,KLOR-CON  Take 1 tablet (20 mEq total) by mouth daily.     valsartan-hydrochlorothiazide 320-12.5 MG per tablet  Commonly known as:  DIOVAN-HCT  Take 1 tablet by mouth daily.     VITAMIN B-6 PO  Take 1 tablet by mouth daily.     VITAMIN B12 PO  Take 1 tablet by mouth daily.        Allergies:  Allergies  Allergen Reactions  . Adhesive [Tape] Rash  . Lipitor [Atorvastatin] Rash    Past Medical History  Diagnosis Date  . Complication of anesthesia     wakes up shaking  . Diabetes mellitus   . Heart murmur     sees Dr. Montez Morita, last seen- early 2012  . Asthma     has used inhaler in past for asthmatic bronchitis, last time- early 2012  . Sleep apnea     borderline sleep apnea, states she no longer uses, early 2012- stopped using   . GERD (gastroesophageal reflux disease)     occas. use of  Prilosec  . Neuromuscular disorder     lumbar radiculopathy, lumbago   . Fibromyalgia   . Arthritis   . Hypertension     02/2010- stress test /w PCP    Past Surgical History  Procedure Laterality Date  . Back surgery  2012  . Abdominal hysterectomy    . Adb.cyst      ovarian cyst  . Eye surgery      macular degeneration treatment - injections    Family History  Problem Relation Age of Onset  . Anesthesia problems Neg Hx   . Hypotension Neg Hx   . Malignant hyperthermia Neg Hx   . Pseudochol deficiency Neg Hx     Social History:  reports that she quit smoking about 14 years ago. She does not have any smokeless tobacco history on file. She reports that she drinks alcohol. She reports that she does not use illicit drugs.  ROS  Hyperlipidemia:   The lipid abnormality consists of elevated LDL , high triglyceride and Is being treated by PCP now with Crestor She was asked to take this every other day because  of the cost; previously was taking it very irregularly. Previously had been on Lescol  Lab Results  Component Value Date   CHOL 215* 10/05/2013   HDL 48.50 10/05/2013   LDLCALC 133* 10/05/2013   LDLDIRECT 153.2 10/27/2012   TRIG 166.0* 10/05/2013   CHOLHDL 4 10/05/2013    HYPERTENSION:  Has been present for several years. this is treated by PCP  She has monitored at home "145". Currently only on Diovan HCT  Now she is on Lyrica for neuropathy and leg pain and she has been needing larger doses   She is possibly going to get epidural steroid again    Examination:   BP 133/74  Pulse 76  Temp(Src) 98.2 F (36.8 C)  Resp 16  Ht 5\' 6"  (1.676 m)  Wt 168 lb 3.2 oz (76.295 kg)  BMI 27.16 kg/m2  SpO2 97%  Body mass index is 27.16 kg/(m^2).   No pedal edema  Assesment/Plan:   1. Diabetes type 2 with BMI of 27  The patient's diabetes control appears to be overall better and she is requiring somewhat less insulin Recent A1c is improving at 7.1 However she is concerned about the cost of Invokana and since she is not able to stop her  basal insulin she prefers to continue her previous regimen and not used Invokana which would be too expensive for her Has not had any weight loss as expected Currently also is having significant low back pain and is not able to exercise  2. Hypertension: Blood pressure is controlled  3. Hyperlipidemia: Her previous LDL was high when she was irregular with her Crestor, will need followup  Recommendations made to the patient today:   Reduce Lantus insulin by 2 units for now and may need to go up if blood sugars started going up higher  Reduce lunchtime coverage especially when eating smaller meals  She will call if she has any difficulties with blood sugar control after her back surgery   Lab reports forwarded to PCP  [facility] 12/15/2013, 2:06 PM

## 2013-12-15 NOTE — Patient Instructions (Signed)
Appointment on 12/10/2013  Component Date Value Ref Range Status  . Hemoglobin A1C 12/10/2013 7.1* 4.6 - 6.5 % Final   Glycemic Control Guidelines for People with Diabetes:Non Diabetic:  <6%Goal of Therapy: <7%Additional Action Suggested:  >8%   . Sodium 12/10/2013 141  135 - 145 mEq/L Final  . Potassium 12/10/2013 3.6  3.5 - 5.1 mEq/L Final  . Chloride 12/10/2013 103  96 - 112 mEq/L Final  . CO2 12/10/2013 26  19 - 32 mEq/L Final  . Glucose, Bld 12/10/2013 111* 70 - 99 mg/dL Final  . BUN 12/10/2013 19  6 - 23 mg/dL Final  . Creatinine, Ser 12/10/2013 1.1  0.4 - 1.2 mg/dL Final  . Calcium 12/10/2013 9.3  8.4 - 10.5 mg/dL Final  . GFR 12/10/2013 61.16  >60.00 mL/min Final    May leave off Invokana  For now 8 Lantus  Lantus dose depends on 3 day average of sugars in ams

## 2013-12-20 ENCOUNTER — Telehealth: Payer: Self-pay | Admitting: Endocrinology

## 2013-12-20 ENCOUNTER — Other Ambulatory Visit: Payer: Self-pay | Admitting: *Deleted

## 2013-12-20 MED ORDER — INSULIN PEN NEEDLE 32G X 4 MM MISC: Status: DC

## 2013-12-20 NOTE — Telephone Encounter (Signed)
Pt needs rx for 4 mg ultra fine pen needle called in for her

## 2013-12-20 NOTE — Telephone Encounter (Signed)
rx sent

## 2013-12-23 DIAGNOSIS — J3089 Other allergic rhinitis: Secondary | ICD-10-CM | POA: Diagnosis not present

## 2013-12-23 DIAGNOSIS — J45909 Unspecified asthma, uncomplicated: Secondary | ICD-10-CM | POA: Diagnosis not present

## 2013-12-23 DIAGNOSIS — H353290 Exudative age-related macular degeneration, unspecified eye, stage unspecified: Secondary | ICD-10-CM | POA: Diagnosis not present

## 2013-12-23 DIAGNOSIS — J301 Allergic rhinitis due to pollen: Secondary | ICD-10-CM | POA: Diagnosis not present

## 2013-12-23 DIAGNOSIS — H35059 Retinal neovascularization, unspecified, unspecified eye: Secondary | ICD-10-CM | POA: Diagnosis not present

## 2013-12-30 ENCOUNTER — Telehealth: Payer: Self-pay | Admitting: *Deleted

## 2013-12-30 NOTE — Telephone Encounter (Signed)
Patient called, she said she keeps getting calls from [facility] asking her if you have found any acid from [place] in her urine? Please advise, they are going to call the patient back to find out the answer soon

## 2013-12-30 NOTE — Telephone Encounter (Signed)
Noted, patient is aware. 

## 2013-12-30 NOTE — Telephone Encounter (Signed)
No ketones

## 2014-02-03 DIAGNOSIS — H3532 Exudative age-related macular degeneration: Secondary | ICD-10-CM | POA: Diagnosis not present

## 2014-02-03 DIAGNOSIS — H35051 Retinal neovascularization, unspecified, right eye: Secondary | ICD-10-CM | POA: Diagnosis not present

## 2014-02-04 ENCOUNTER — Telehealth: Payer: Self-pay | Admitting: Endocrinology

## 2014-02-04 NOTE — Telephone Encounter (Signed)
[name] called about supplies for this patient Voice mail was very muffled so I really could not under stand everything she said phone # [phone] reference # [id number]

## 2014-02-04 NOTE — Telephone Encounter (Signed)
That's not the correct # that's the fax # for up front.

## 2014-02-09 ENCOUNTER — Ambulatory Visit (INDEPENDENT_AMBULATORY_CARE_PROVIDER_SITE_OTHER): Payer: Medicare Other | Admitting: *Deleted

## 2014-02-09 DIAGNOSIS — Z6829 Body mass index (BMI) 29.0-29.9, adult: Secondary | ICD-10-CM | POA: Diagnosis not present

## 2014-02-09 DIAGNOSIS — Z23 Encounter for immunization: Secondary | ICD-10-CM

## 2014-02-09 DIAGNOSIS — M4806 Spinal stenosis, lumbar region: Secondary | ICD-10-CM | POA: Diagnosis not present

## 2014-02-09 DIAGNOSIS — I10 Essential (primary) hypertension: Secondary | ICD-10-CM | POA: Diagnosis not present

## 2014-02-10 DIAGNOSIS — H2513 Age-related nuclear cataract, bilateral: Secondary | ICD-10-CM | POA: Diagnosis not present

## 2014-02-10 DIAGNOSIS — E119 Type 2 diabetes mellitus without complications: Secondary | ICD-10-CM | POA: Diagnosis not present

## 2014-02-10 DIAGNOSIS — Z794 Long term (current) use of insulin: Secondary | ICD-10-CM | POA: Diagnosis not present

## 2014-02-14 ENCOUNTER — Other Ambulatory Visit: Payer: Self-pay | Admitting: Neurological Surgery

## 2014-02-15 ENCOUNTER — Other Ambulatory Visit: Payer: Self-pay | Admitting: Neurological Surgery

## 2014-02-15 ENCOUNTER — Encounter (HOSPITAL_COMMUNITY): Payer: Self-pay | Admitting: Pharmacy Technician

## 2014-02-15 DIAGNOSIS — M48061 Spinal stenosis, lumbar region without neurogenic claudication: Secondary | ICD-10-CM

## 2014-02-18 ENCOUNTER — Ambulatory Visit
Admission: RE | Admit: 2014-02-18 | Discharge: 2014-02-18 | Disposition: A | Discharge status: Home / Self Care | Payer: Medicare Other | Source: Ambulatory Visit | Attending: Neurological Surgery | Admitting: Neurological Surgery

## 2014-02-18 DIAGNOSIS — M4806 Spinal stenosis, lumbar region: Secondary | ICD-10-CM | POA: Diagnosis not present

## 2014-02-18 DIAGNOSIS — M48061 Spinal stenosis, lumbar region without neurogenic claudication: Secondary | ICD-10-CM

## 2014-02-18 DIAGNOSIS — M47816 Spondylosis without myelopathy or radiculopathy, lumbar region: Secondary | ICD-10-CM | POA: Diagnosis not present

## 2014-02-18 DIAGNOSIS — M5126 Other intervertebral disc displacement, lumbar region: Secondary | ICD-10-CM | POA: Diagnosis not present

## 2014-02-23 DIAGNOSIS — H2512 Age-related nuclear cataract, left eye: Secondary | ICD-10-CM | POA: Diagnosis not present

## 2014-02-24 NOTE — Pre-Procedure Instructions (Signed)
[name]  02/24/2014   Your procedure is scheduled on:  Monday, November 9th  Report to [facility] Admitting at 0800 AM.  Call this number if you have problems the morning of surgery: [phone]   Remember:   Do not eat food or drink liquids after midnight.   Take these medicines the morning of surgery with A SIP OF WATER: neurontin, zyrtec, nasal spray, hydrocodone if needed, lyrica  Stop taking aspirin, OTC vitamins/herbal medications, NSAIDS (ibuprofen, advil, motrin) 7 days prior to surgery.    Do not wear jewelry, make-up or nail polish.  Do not wear lotions, powders, or perfume, deodorant.  Do not shave 48 hours prior to surgery. Men may shave face and neck.  Do not bring valuables to the hospital.  [facility] is not responsible  for any belongings or valuables.               Contacts, dentures or bridgework may not be worn into surgery.  Leave suitcase in the car. After surgery it may be brought to your room.  For patients admitted to the hospital, discharge time is determined by your treatment team.               Patients discharged the day of surgery will not be allowed to drive home.  Please read over the following fact sheets that you were given: Pain Booklet, Coughing and Deep Breathing, Blood Transfusion Information, MRSA Information and Surgical Site Infection Prevention  [place] - Preparing for Surgery  Before surgery, you can play an important role.  Because skin is not sterile, your skin needs to be as free of germs as possible.  You can reduce the number of germs on you skin by washing with CHG (chlorahexidine gluconate) soap before surgery.  CHG is an antiseptic cleaner which kills germs and bonds with the skin to continue killing germs even after washing.  Please DO NOT use if you have an allergy to CHG or antibacterial soaps.  If your skin becomes reddened/irritated stop using the CHG and inform your nurse when you arrive at Short  Stay.  Do not shave (including legs and underarms) for at least 48 hours prior to the first CHG shower.  You may shave your face.  Please follow these instructions carefully:   1.  Shower with CHG Soap the night before surgery and the morning of Surgery.  2.  If you choose to wash your hair, wash your hair first as usual with your normal shampoo.  3.  After you shampoo, rinse your hair and body thoroughly to remove the shampoo.  4.  Use CHG as you would any other liquid soap.  You can apply CHG directly to the skin and wash gently with scrungie or a clean washcloth.  5.  Apply the CHG Soap to your body ONLY FROM THE NECK DOWN.  Do not use on open wounds or open sores.  Avoid contact with your eyes, ears, mouth and genitals (private parts).  Wash genitals (private parts) with your normal soap.  6.  Wash thoroughly, paying special attention to the area where your surgery will be performed.  7.  Thoroughly rinse your body with warm water from the neck down.  8.  DO NOT shower/wash with your normal soap after using and rinsing off the CHG Soap.  9.  Pat yourself dry with a clean towel.            10.  Wear clean  pajamas.            11.  Place clean sheets on your bed the night of your first shower and do not sleep with pets.  Day of Surgery  Do not apply any lotions/deoderants the morning of surgery.  Please wear clean clothes to the hospital/surgery center.

## 2014-02-25 ENCOUNTER — Encounter (HOSPITAL_COMMUNITY)
Admission: RE | Admit: 2014-02-25 | Discharge: 2014-02-25 | Disposition: A | Discharge status: Home / Self Care | Payer: Medicare Other | Source: Ambulatory Visit | Attending: Neurological Surgery | Admitting: Neurological Surgery

## 2014-02-25 ENCOUNTER — Encounter (HOSPITAL_COMMUNITY): Payer: Self-pay

## 2014-02-25 DIAGNOSIS — G4730 Sleep apnea, unspecified: Secondary | ICD-10-CM | POA: Diagnosis present

## 2014-02-25 DIAGNOSIS — M1990 Unspecified osteoarthritis, unspecified site: Secondary | ICD-10-CM | POA: Diagnosis present

## 2014-02-25 DIAGNOSIS — M4316 Spondylolisthesis, lumbar region: Secondary | ICD-10-CM | POA: Diagnosis not present

## 2014-02-25 DIAGNOSIS — Z794 Long term (current) use of insulin: Secondary | ICD-10-CM | POA: Diagnosis not present

## 2014-02-25 DIAGNOSIS — M47896 Other spondylosis, lumbar region: Secondary | ICD-10-CM | POA: Diagnosis not present

## 2014-02-25 DIAGNOSIS — M797 Fibromyalgia: Secondary | ICD-10-CM | POA: Diagnosis present

## 2014-02-25 DIAGNOSIS — M4326 Fusion of spine, lumbar region: Secondary | ICD-10-CM | POA: Diagnosis not present

## 2014-02-25 DIAGNOSIS — M4726 Other spondylosis with radiculopathy, lumbar region: Secondary | ICD-10-CM | POA: Diagnosis present

## 2014-02-25 DIAGNOSIS — M4800 Spinal stenosis, site unspecified: Secondary | ICD-10-CM | POA: Diagnosis not present

## 2014-02-25 DIAGNOSIS — E119 Type 2 diabetes mellitus without complications: Secondary | ICD-10-CM | POA: Diagnosis not present

## 2014-02-25 DIAGNOSIS — I10 Essential (primary) hypertension: Secondary | ICD-10-CM | POA: Diagnosis not present

## 2014-02-25 DIAGNOSIS — R2681 Unsteadiness on feet: Secondary | ICD-10-CM | POA: Diagnosis not present

## 2014-02-25 DIAGNOSIS — Z981 Arthrodesis status: Secondary | ICD-10-CM | POA: Diagnosis not present

## 2014-02-25 DIAGNOSIS — M549 Dorsalgia, unspecified: Secondary | ICD-10-CM | POA: Diagnosis present

## 2014-02-25 DIAGNOSIS — K5900 Constipation, unspecified: Secondary | ICD-10-CM | POA: Diagnosis present

## 2014-02-25 DIAGNOSIS — M4806 Spinal stenosis, lumbar region: Secondary | ICD-10-CM | POA: Diagnosis present

## 2014-02-25 DIAGNOSIS — M5126 Other intervertebral disc displacement, lumbar region: Secondary | ICD-10-CM | POA: Diagnosis present

## 2014-02-25 DIAGNOSIS — Z87891 Personal history of nicotine dependence: Secondary | ICD-10-CM | POA: Diagnosis not present

## 2014-02-25 DIAGNOSIS — K219 Gastro-esophageal reflux disease without esophagitis: Secondary | ICD-10-CM | POA: Diagnosis not present

## 2014-02-25 DIAGNOSIS — D573 Sickle-cell trait: Secondary | ICD-10-CM | POA: Diagnosis not present

## 2014-02-25 DIAGNOSIS — H3530 Unspecified macular degeneration: Secondary | ICD-10-CM | POA: Diagnosis present

## 2014-02-25 DIAGNOSIS — M6281 Muscle weakness (generalized): Secondary | ICD-10-CM | POA: Diagnosis not present

## 2014-02-25 DIAGNOSIS — J45909 Unspecified asthma, uncomplicated: Secondary | ICD-10-CM | POA: Diagnosis present

## 2014-02-25 DIAGNOSIS — R279 Unspecified lack of coordination: Secondary | ICD-10-CM | POA: Diagnosis not present

## 2014-02-25 DIAGNOSIS — Z888 Allergy status to other drugs, medicaments and biological substances status: Secondary | ICD-10-CM | POA: Diagnosis not present

## 2014-02-25 HISTORY — DX: Anemia, unspecified: D64.9

## 2014-02-25 LAB — TYPE AND SCREEN
ABO/RH(D): B POS
Antibody Screen: NEGATIVE

## 2014-02-25 LAB — BASIC METABOLIC PANEL
Anion gap: 15 (ref 5–15)
BUN: 14 mg/dL (ref 6–23)
CALCIUM: 10 mg/dL (ref 8.4–10.5)
CO2: 25 mEq/L (ref 19–32)
Chloride: 100 mEq/L (ref 96–112)
Creatinine, Ser: 0.77 mg/dL (ref 0.50–1.10)
GFR calc Af Amer: >90 mL/min (ref >90)
GFR calc non Af Amer: 81 mL/min — ABNORMAL LOW (ref >90)
GLUCOSE: 208 mg/dL — AB (ref 70–99)
Potassium: 3.8 mEq/L (ref 3.7–5.3)
SODIUM: 140 meq/L (ref 137–147)

## 2014-02-25 LAB — CBC
HCT: 39.8 % (ref 36.0–46.0)
Hemoglobin: 13.2 g/dL (ref 12.0–15.0)
MCH: 27.2 pg (ref 26.0–34.0)
MCHC: 33.2 g/dL (ref 30.0–36.0)
MCV: 82.1 fL (ref 78.0–100.0)
PLATELETS: 202 10*3/uL (ref 150–400)
RBC: 4.85 MIL/uL (ref 3.87–5.11)
RDW: 13.6 % (ref 11.5–15.5)
WBC: 5.9 10*3/uL (ref 4.0–10.5)

## 2014-02-25 LAB — SURGICAL PCR SCREEN
MRSA, PCR: NEGATIVE
Staphylococcus aureus: NEGATIVE

## 2014-02-25 NOTE — Progress Notes (Addendum)
Patient sees Dr. Montez Morita as PCP & cardio.  Her last stress test, she believes is about 8 or more yrs ago.  No problems since.  DA Wants to speak with Dr. Ellene Route before signing the permit.   DA

## 2014-02-27 MED ORDER — CEFAZOLIN SODIUM-DEXTROSE 2-3 GM-% IV SOLR
2.0000 g | INTRAVENOUS | Status: DC
  Filled 2014-02-27: qty 50

## 2014-02-28 ENCOUNTER — Inpatient Hospital Stay (HOSPITAL_COMMUNITY): Payer: Medicare Other | Admitting: Certified Registered"

## 2014-02-28 ENCOUNTER — Encounter (HOSPITAL_COMMUNITY)
Admission: RE | Disposition: A | Discharge status: Skilled Nursing Facility | Payer: Self-pay | Source: Ambulatory Visit | Attending: Neurological Surgery

## 2014-02-28 ENCOUNTER — Inpatient Hospital Stay (HOSPITAL_COMMUNITY)
Admission: RE | Admit: 2014-02-28 | Discharge: 2014-03-04 | DRG: 460 | Disposition: A | Discharge status: Skilled Nursing Facility | Payer: Medicare Other | Source: Ambulatory Visit | Attending: Neurological Surgery | Admitting: Neurological Surgery

## 2014-02-28 ENCOUNTER — Encounter (HOSPITAL_COMMUNITY): Payer: Self-pay | Admitting: *Deleted

## 2014-02-28 ENCOUNTER — Inpatient Hospital Stay (HOSPITAL_COMMUNITY): Payer: Medicare Other

## 2014-02-28 DIAGNOSIS — J45909 Unspecified asthma, uncomplicated: Secondary | ICD-10-CM | POA: Diagnosis present

## 2014-02-28 DIAGNOSIS — M5126 Other intervertebral disc displacement, lumbar region: Secondary | ICD-10-CM | POA: Diagnosis present

## 2014-02-28 DIAGNOSIS — M47896 Other spondylosis, lumbar region: Secondary | ICD-10-CM | POA: Diagnosis not present

## 2014-02-28 DIAGNOSIS — K5900 Constipation, unspecified: Secondary | ICD-10-CM | POA: Diagnosis present

## 2014-02-28 DIAGNOSIS — M4806 Spinal stenosis, lumbar region: Secondary | ICD-10-CM | POA: Diagnosis present

## 2014-02-28 DIAGNOSIS — H3530 Unspecified macular degeneration: Secondary | ICD-10-CM | POA: Diagnosis present

## 2014-02-28 DIAGNOSIS — Z888 Allergy status to other drugs, medicaments and biological substances status: Secondary | ICD-10-CM

## 2014-02-28 DIAGNOSIS — Z794 Long term (current) use of insulin: Secondary | ICD-10-CM | POA: Diagnosis not present

## 2014-02-28 DIAGNOSIS — Z87891 Personal history of nicotine dependence: Secondary | ICD-10-CM

## 2014-02-28 DIAGNOSIS — M549 Dorsalgia, unspecified: Secondary | ICD-10-CM | POA: Diagnosis not present

## 2014-02-28 DIAGNOSIS — M1990 Unspecified osteoarthritis, unspecified site: Secondary | ICD-10-CM | POA: Diagnosis not present

## 2014-02-28 DIAGNOSIS — M5416 Radiculopathy, lumbar region: Secondary | ICD-10-CM

## 2014-02-28 DIAGNOSIS — I10 Essential (primary) hypertension: Secondary | ICD-10-CM | POA: Diagnosis present

## 2014-02-28 DIAGNOSIS — M4326 Fusion of spine, lumbar region: Secondary | ICD-10-CM | POA: Diagnosis not present

## 2014-02-28 DIAGNOSIS — G4730 Sleep apnea, unspecified: Secondary | ICD-10-CM | POA: Diagnosis present

## 2014-02-28 DIAGNOSIS — M4800 Spinal stenosis, site unspecified: Secondary | ICD-10-CM | POA: Diagnosis not present

## 2014-02-28 DIAGNOSIS — Z981 Arthrodesis status: Secondary | ICD-10-CM

## 2014-02-28 DIAGNOSIS — M48061 Spinal stenosis, lumbar region without neurogenic claudication: Secondary | ICD-10-CM | POA: Diagnosis present

## 2014-02-28 DIAGNOSIS — E119 Type 2 diabetes mellitus without complications: Secondary | ICD-10-CM | POA: Diagnosis present

## 2014-02-28 DIAGNOSIS — D573 Sickle-cell trait: Secondary | ICD-10-CM | POA: Diagnosis present

## 2014-02-28 DIAGNOSIS — M4726 Other spondylosis with radiculopathy, lumbar region: Secondary | ICD-10-CM | POA: Diagnosis present

## 2014-02-28 DIAGNOSIS — M4316 Spondylolisthesis, lumbar region: Principal | ICD-10-CM | POA: Diagnosis present

## 2014-02-28 DIAGNOSIS — M6281 Muscle weakness (generalized): Secondary | ICD-10-CM | POA: Diagnosis not present

## 2014-02-28 DIAGNOSIS — K219 Gastro-esophageal reflux disease without esophagitis: Secondary | ICD-10-CM | POA: Diagnosis present

## 2014-02-28 DIAGNOSIS — R2681 Unsteadiness on feet: Secondary | ICD-10-CM | POA: Diagnosis not present

## 2014-02-28 DIAGNOSIS — R279 Unspecified lack of coordination: Secondary | ICD-10-CM | POA: Diagnosis not present

## 2014-02-28 DIAGNOSIS — M797 Fibromyalgia: Secondary | ICD-10-CM | POA: Diagnosis not present

## 2014-02-28 HISTORY — DX: Spinal stenosis, lumbar region without neurogenic claudication: M54.16

## 2014-02-28 HISTORY — DX: Spinal stenosis, lumbar region without neurogenic claudication: M48.061

## 2014-02-28 LAB — GLUCOSE, CAPILLARY
GLUCOSE-CAPILLARY: 239 mg/dL — AB (ref 70–99)
GLUCOSE-CAPILLARY: 196 mg/dL — AB (ref 70–99)
GLUCOSE-CAPILLARY: 201 mg/dL — AB (ref 70–99)
Glucose-Capillary: 238 mg/dL — ABNORMAL HIGH (ref 70–99)

## 2014-02-28 SURGERY — POSTERIOR LUMBAR FUSION 2 LEVEL
Anesthesia: General | Site: Back

## 2014-02-28 MED ORDER — VECURONIUM BROMIDE 10 MG IV SOLR
INTRAVENOUS | Status: AC
  Filled 2014-02-28: qty 10

## 2014-02-28 MED ORDER — DOCUSATE SODIUM 100 MG PO CAPS
100.0000 mg | ORAL_CAPSULE | Freq: Two times a day (BID) | ORAL | Status: DC
  Administered 2014-02-28 – 2014-03-04 (×8): 100 mg via ORAL
  Filled 2014-02-28 (×9): qty 1

## 2014-02-28 MED ORDER — CEFAZOLIN SODIUM 1-5 GM-% IV SOLN
1.0000 g | Freq: Three times a day (TID) | INTRAVENOUS | Status: AC
  Administered 2014-02-28 – 2014-03-01 (×2): 1 g via INTRAVENOUS
  Filled 2014-02-28 (×2): qty 50

## 2014-02-28 MED ORDER — ROCURONIUM BROMIDE 50 MG/5ML IV SOLN
INTRAVENOUS | Status: AC
  Filled 2014-02-28: qty 1

## 2014-02-28 MED ORDER — LIDOCAINE-EPINEPHRINE 1 %-1:100000 IJ SOLN
INTRAMUSCULAR | Status: DC | PRN
  Administered 2014-02-28: 5 mL

## 2014-02-28 MED ORDER — NEOSTIGMINE METHYLSULFATE 10 MG/10ML IV SOLN
INTRAVENOUS | Status: AC
  Filled 2014-02-28: qty 1

## 2014-02-28 MED ORDER — BISACODYL 10 MG RE SUPP: 10.0000 mg | Freq: Every day | RECTAL | Status: DC | PRN

## 2014-02-28 MED ORDER — SODIUM CHLORIDE 0.9 % IV SOLN
INTRAVENOUS | Status: DC
  Administered 2014-02-28: 75 mL/h via INTRAVENOUS

## 2014-02-28 MED ORDER — ALUM & MAG HYDROXIDE-SIMETH 200-200-20 MG/5ML PO SUSP: 30.0000 mL | Freq: Four times a day (QID) | ORAL | Status: DC | PRN

## 2014-02-28 MED ORDER — MORPHINE SULFATE 2 MG/ML IJ SOLN
1.0000 mg | INTRAMUSCULAR | Status: DC | PRN
  Administered 2014-02-28 – 2014-03-01 (×7): 2 mg via INTRAVENOUS
  Administered 2014-03-02 – 2014-03-03 (×2): 4 mg via INTRAVENOUS
  Filled 2014-02-28: qty 2
  Filled 2014-02-28 (×4): qty 1
  Filled 2014-02-28: qty 2
  Filled 2014-02-28: qty 1
  Filled 2014-02-28: qty 2

## 2014-02-28 MED ORDER — 0.9 % SODIUM CHLORIDE (POUR BTL) OPTIME
TOPICAL | Status: DC | PRN
  Administered 2014-02-28: 1000 mL

## 2014-02-28 MED ORDER — GABAPENTIN 300 MG PO CAPS
300.0000 mg | ORAL_CAPSULE | Freq: Every day | ORAL | Status: DC
  Administered 2014-02-28 – 2014-03-03 (×4): 300 mg via ORAL
  Filled 2014-02-28 (×5): qty 1

## 2014-02-28 MED ORDER — GLIMEPIRIDE 4 MG PO TABS
2.0000 mg | ORAL_TABLET | Freq: Every day | ORAL | Status: DC
  Administered 2014-03-01 – 2014-03-04 (×4): 2 mg via ORAL
  Filled 2014-02-28 (×5): qty 1

## 2014-02-28 MED ORDER — SODIUM CHLORIDE 0.9 % IV SOLN: 250.0000 mL | INTRAVENOUS | Status: DC

## 2014-02-28 MED ORDER — FLUTICASONE PROPIONATE 50 MCG/ACT NA SUSP
1.0000 | Freq: Every day | NASAL | Status: DC
  Administered 2014-02-28 – 2014-03-04 (×4): 1 via NASAL
  Filled 2014-02-28: qty 16

## 2014-02-28 MED ORDER — SODIUM CHLORIDE 0.9 % IJ SOLN: 3.0000 mL | INTRAMUSCULAR | Status: DC | PRN

## 2014-02-28 MED ORDER — INSULIN LISPRO 100 UNIT/ML (KWIKPEN): 3.0000 [IU] | PEN_INJECTOR | Freq: Three times a day (TID) | SUBCUTANEOUS | Status: DC

## 2014-02-28 MED ORDER — GLYCOPYRROLATE 0.2 MG/ML IJ SOLN
INTRAMUSCULAR | Status: DC | PRN
  Administered 2014-02-28: 0.4 mg via INTRAVENOUS

## 2014-02-28 MED ORDER — DEXAMETHASONE SODIUM PHOSPHATE 4 MG/ML IJ SOLN
INTRAMUSCULAR | Status: AC
  Filled 2014-02-28: qty 1

## 2014-02-28 MED ORDER — ROCURONIUM BROMIDE 100 MG/10ML IV SOLN
INTRAVENOUS | Status: DC | PRN
  Administered 2014-02-28: 50 mg via INTRAVENOUS

## 2014-02-28 MED ORDER — THROMBIN 5000 UNITS EX SOLR
OROMUCOSAL | Status: DC | PRN
  Administered 2014-02-28: 5 mL via TOPICAL

## 2014-02-28 MED ORDER — INSULIN ASPART 100 UNIT/ML ~~LOC~~ SOLN
0.0000 [IU] | SUBCUTANEOUS | Status: DC
  Administered 2014-02-28: 3 [IU] via SUBCUTANEOUS
  Administered 2014-03-01: 2 [IU] via SUBCUTANEOUS
  Administered 2014-03-01 (×3): 3 [IU] via SUBCUTANEOUS

## 2014-02-28 MED ORDER — PHENYLEPHRINE HCL 10 MG/ML IJ SOLN
INTRAMUSCULAR | Status: AC
  Filled 2014-02-28: qty 1

## 2014-02-28 MED ORDER — HYDROMORPHONE HCL 1 MG/ML IJ SOLN
0.2500 mg | INTRAMUSCULAR | Status: DC | PRN
  Administered 2014-02-28 (×2): 0.5 mg via INTRAVENOUS

## 2014-02-28 MED ORDER — SENNA 8.6 MG PO TABS
1.0000 | ORAL_TABLET | Freq: Two times a day (BID) | ORAL | Status: DC
  Administered 2014-02-28 – 2014-03-04 (×8): 8.6 mg via ORAL
  Filled 2014-02-28 (×9): qty 1

## 2014-02-28 MED ORDER — ALBUMIN HUMAN 5 % IV SOLN
INTRAVENOUS | Status: DC | PRN
  Administered 2014-02-28: 14:00:00 via INTRAVENOUS

## 2014-02-28 MED ORDER — CEFAZOLIN SODIUM-DEXTROSE 2-3 GM-% IV SOLR
INTRAVENOUS | Status: DC | PRN
  Administered 2014-02-28 (×2): 2 g via INTRAVENOUS

## 2014-02-28 MED ORDER — GLIMEPIRIDE 2 MG PO TABS: 2.0000 mg | ORAL_TABLET | Freq: Two times a day (BID) | ORAL | Status: DC

## 2014-02-28 MED ORDER — SODIUM CHLORIDE 0.9 % IJ SOLN
3.0000 mL | Freq: Two times a day (BID) | INTRAMUSCULAR | Status: DC
  Administered 2014-03-01 – 2014-03-02 (×2): 3 mL via INTRAVENOUS

## 2014-02-28 MED ORDER — BUPIVACAINE HCL (PF) 0.5 % IJ SOLN
INTRAMUSCULAR | Status: DC | PRN
  Administered 2014-02-28: 20 mL
  Administered 2014-02-28: 5 mL

## 2014-02-28 MED ORDER — IRBESARTAN 300 MG PO TABS
300.0000 mg | ORAL_TABLET | Freq: Every day | ORAL | Status: DC
  Administered 2014-03-01 – 2014-03-04 (×4): 300 mg via ORAL
  Filled 2014-02-28 (×4): qty 1

## 2014-02-28 MED ORDER — VALSARTAN-HYDROCHLOROTHIAZIDE 320-12.5 MG PO TABS: 1.0000 | ORAL_TABLET | Freq: Every day | ORAL | Status: DC

## 2014-02-28 MED ORDER — CETYLPYRIDINIUM CHLORIDE 0.05 % MT LIQD
7.0000 mL | Freq: Two times a day (BID) | OROMUCOSAL | Status: DC
  Administered 2014-02-28 – 2014-03-04 (×6): 7 mL via OROMUCOSAL

## 2014-02-28 MED ORDER — POLYETHYLENE GLYCOL 3350 17 G PO PACK
17.0000 g | PACK | Freq: Every day | ORAL | Status: DC | PRN
  Administered 2014-03-02: 17 g via ORAL
  Filled 2014-02-28 (×2): qty 1

## 2014-02-28 MED ORDER — PROPOFOL 10 MG/ML IV BOLUS
INTRAVENOUS | Status: AC
  Filled 2014-02-28: qty 20

## 2014-02-28 MED ORDER — CEFAZOLIN SODIUM-DEXTROSE 2-3 GM-% IV SOLR
INTRAVENOUS | Status: AC
  Filled 2014-02-28: qty 50

## 2014-02-28 MED ORDER — ALENDRONATE SODIUM 70 MG PO TABS: 70.0000 mg | ORAL_TABLET | ORAL | Status: DC

## 2014-02-28 MED ORDER — PREGABALIN 50 MG PO CAPS
50.0000 mg | ORAL_CAPSULE | Freq: Two times a day (BID) | ORAL | Status: DC
  Administered 2014-02-28 – 2014-03-04 (×7): 50 mg via ORAL
  Filled 2014-02-28 (×7): qty 1

## 2014-02-28 MED ORDER — VECURONIUM BROMIDE 10 MG IV SOLR
INTRAVENOUS | Status: DC | PRN
  Administered 2014-02-28: 2 mg via INTRAVENOUS
  Administered 2014-02-28 (×3): 1 mg via INTRAVENOUS

## 2014-02-28 MED ORDER — BACITRACIN 50000 UNITS IM SOLR
INTRAMUSCULAR | Status: DC | PRN
  Administered 2014-02-28: 500 mL

## 2014-02-28 MED ORDER — OXYCODONE-ACETAMINOPHEN 5-325 MG PO TABS
1.0000 | ORAL_TABLET | ORAL | Status: DC | PRN
  Administered 2014-02-28 – 2014-03-04 (×13): 2 via ORAL
  Filled 2014-02-28 (×13): qty 2

## 2014-02-28 MED ORDER — HYDROMORPHONE HCL 1 MG/ML IJ SOLN
INTRAMUSCULAR | Status: AC
  Filled 2014-02-28: qty 1

## 2014-02-28 MED ORDER — ACETAMINOPHEN 325 MG PO TABS: 650.0000 mg | ORAL_TABLET | ORAL | Status: DC | PRN

## 2014-02-28 MED ORDER — SUFENTANIL CITRATE 50 MCG/ML IV SOLN
INTRAVENOUS | Status: AC
  Filled 2014-02-28: qty 1

## 2014-02-28 MED ORDER — ONDANSETRON HCL 4 MG/2ML IJ SOLN
INTRAMUSCULAR | Status: AC
  Filled 2014-02-28: qty 2

## 2014-02-28 MED ORDER — PHENOL 1.4 % MT LIQD: 1.0000 | OROMUCOSAL | Status: DC | PRN

## 2014-02-28 MED ORDER — OXYCODONE HCL 5 MG/5ML PO SOLN: 5.0000 mg | Freq: Once | ORAL | Status: DC | PRN

## 2014-02-28 MED ORDER — SUFENTANIL CITRATE 50 MCG/ML IV SOLN
INTRAVENOUS | Status: DC | PRN
  Administered 2014-02-28: 10 ug via INTRAVENOUS
  Administered 2014-02-28: 15 ug via INTRAVENOUS
  Administered 2014-02-28: 10 ug via INTRAVENOUS
  Administered 2014-02-28: 5 ug via INTRAVENOUS

## 2014-02-28 MED ORDER — MIDAZOLAM HCL 5 MG/5ML IJ SOLN
INTRAMUSCULAR | Status: DC | PRN
  Administered 2014-02-28: 2 mg via INTRAVENOUS

## 2014-02-28 MED ORDER — OLOPATADINE HCL 0.6 % NA SOLN
1.0000 | Freq: Three times a day (TID) | NASAL | Status: DC
  Administered 2014-02-28 – 2014-03-04 (×9): 1 via NASAL
  Filled 2014-02-28: qty 30.5

## 2014-02-28 MED ORDER — METHOCARBAMOL 500 MG PO TABS
500.0000 mg | ORAL_TABLET | Freq: Four times a day (QID) | ORAL | Status: DC | PRN
  Administered 2014-03-01 – 2014-03-02 (×3): 500 mg via ORAL
  Filled 2014-02-28 (×4): qty 1

## 2014-02-28 MED ORDER — DEXTROSE 5 % IV SOLN
10.0000 mg | INTRAVENOUS | Status: DC | PRN
  Administered 2014-02-28: 50 ug/min via INTRAVENOUS

## 2014-02-28 MED ORDER — MIDAZOLAM HCL 2 MG/2ML IJ SOLN
INTRAMUSCULAR | Status: AC
  Filled 2014-02-28: qty 2

## 2014-02-28 MED ORDER — THROMBIN 20000 UNITS EX SOLR
CUTANEOUS | Status: DC | PRN
  Administered 2014-02-28: 20 mL via TOPICAL

## 2014-02-28 MED ORDER — NEOSTIGMINE METHYLSULFATE 10 MG/10ML IV SOLN
INTRAVENOUS | Status: DC | PRN
  Administered 2014-02-28: 3 mg via INTRAVENOUS

## 2014-02-28 MED ORDER — LORATADINE 10 MG PO TABS
10.0000 mg | ORAL_TABLET | Freq: Every day | ORAL | Status: DC
  Administered 2014-02-28 – 2014-03-04 (×5): 10 mg via ORAL
  Filled 2014-02-28 (×5): qty 1

## 2014-02-28 MED ORDER — ONDANSETRON HCL 4 MG/2ML IJ SOLN
INTRAMUSCULAR | Status: DC | PRN
  Administered 2014-02-28: 4 mg via INTRAVENOUS

## 2014-02-28 MED ORDER — HYDROCHLOROTHIAZIDE 12.5 MG PO CAPS
12.5000 mg | ORAL_CAPSULE | Freq: Every day | ORAL | Status: DC
  Administered 2014-03-01 – 2014-03-04 (×4): 12.5 mg via ORAL
  Filled 2014-02-28 (×4): qty 1

## 2014-02-28 MED ORDER — PHENYLEPHRINE HCL 10 MG/ML IJ SOLN
INTRAMUSCULAR | Status: DC | PRN
  Administered 2014-02-28 (×5): 80 ug via INTRAVENOUS

## 2014-02-28 MED ORDER — OXYCODONE HCL 5 MG PO TABS: 5.0000 mg | ORAL_TABLET | Freq: Once | ORAL | Status: DC | PRN

## 2014-02-28 MED ORDER — LACTATED RINGERS IV SOLN
INTRAVENOUS | Status: DC
  Administered 2014-02-28 (×3): via INTRAVENOUS

## 2014-02-28 MED ORDER — GABAPENTIN 100 MG PO CAPS: 200.0000 mg | ORAL_CAPSULE | Freq: Two times a day (BID) | ORAL | Status: DC

## 2014-02-28 MED ORDER — POTASSIUM CHLORIDE CRYS ER 20 MEQ PO TBCR
20.0000 meq | EXTENDED_RELEASE_TABLET | Freq: Every day | ORAL | Status: DC
  Administered 2014-02-28 – 2014-03-04 (×5): 20 meq via ORAL
  Filled 2014-02-28 (×5): qty 1

## 2014-02-28 MED ORDER — DEXAMETHASONE SODIUM PHOSPHATE 4 MG/ML IJ SOLN
INTRAMUSCULAR | Status: DC | PRN
  Administered 2014-02-28: 4 mg via INTRAVENOUS

## 2014-02-28 MED ORDER — GLIMEPIRIDE 4 MG PO TABS
4.0000 mg | ORAL_TABLET | Freq: Every day | ORAL | Status: DC
  Administered 2014-03-01 – 2014-03-03 (×3): 4 mg via ORAL
  Filled 2014-02-28 (×4): qty 1

## 2014-02-28 MED ORDER — METHOCARBAMOL 1000 MG/10ML IJ SOLN: 500.0000 mg | Freq: Four times a day (QID) | INTRAVENOUS | Status: DC | PRN

## 2014-02-28 MED ORDER — GABAPENTIN 100 MG PO CAPS
200.0000 mg | ORAL_CAPSULE | Freq: Every day | ORAL | Status: DC
  Administered 2014-02-28 – 2014-03-04 (×5): 200 mg via ORAL
  Filled 2014-02-28 (×5): qty 2

## 2014-02-28 MED ORDER — ACETAMINOPHEN 650 MG RE SUPP: 650.0000 mg | RECTAL | Status: DC | PRN

## 2014-02-28 MED ORDER — PROMETHAZINE HCL 25 MG/ML IJ SOLN: 6.2500 mg | INTRAMUSCULAR | Status: DC | PRN

## 2014-02-28 MED ORDER — INSULIN GLARGINE 100 UNIT/ML ~~LOC~~ SOLN
12.0000 [IU] | Freq: Every day | SUBCUTANEOUS | Status: DC
  Administered 2014-02-28 – 2014-03-03 (×4): 12 [IU] via SUBCUTANEOUS
  Filled 2014-02-28 (×5): qty 0.12

## 2014-02-28 MED ORDER — ROSUVASTATIN CALCIUM 10 MG PO TABS
10.0000 mg | ORAL_TABLET | Freq: Every day | ORAL | Status: DC
  Administered 2014-02-28 – 2014-03-03 (×4): 10 mg via ORAL
  Filled 2014-02-28 (×6): qty 1

## 2014-02-28 MED ORDER — LIDOCAINE HCL (CARDIAC) 20 MG/ML IV SOLN
INTRAVENOUS | Status: DC | PRN
  Administered 2014-02-28: 80 mg via INTRAVENOUS

## 2014-02-28 MED ORDER — PROPOFOL 10 MG/ML IV BOLUS
INTRAVENOUS | Status: DC | PRN
  Administered 2014-02-28: 50 mg via INTRAVENOUS
  Administered 2014-02-28: 150 mg via INTRAVENOUS

## 2014-02-28 MED ORDER — SODIUM CHLORIDE 0.9 % IV SOLN
INTRAVENOUS | Status: DC | PRN
  Administered 2014-02-28: 16:00:00 via INTRAVENOUS

## 2014-02-28 MED ORDER — ONDANSETRON HCL 4 MG/2ML IJ SOLN
4.0000 mg | INTRAMUSCULAR | Status: DC | PRN
  Administered 2014-03-02: 4 mg via INTRAVENOUS
  Filled 2014-02-28: qty 2

## 2014-02-28 MED ORDER — LIDOCAINE HCL (CARDIAC) 20 MG/ML IV SOLN
INTRAVENOUS | Status: AC
  Filled 2014-02-28: qty 5

## 2014-02-28 MED ORDER — INSULIN GLARGINE 100 UNIT/ML SOLOSTAR PEN: 4.0000 [IU] | PEN_INJECTOR | Freq: Every day | SUBCUTANEOUS | Status: DC

## 2014-02-28 MED ORDER — INSULIN GLARGINE 100 UNIT/ML ~~LOC~~ SOLN
8.0000 [IU] | Freq: Every day | SUBCUTANEOUS | Status: DC
  Filled 2014-02-28 (×5): qty 0.08

## 2014-02-28 MED ORDER — MENTHOL 3 MG MT LOZG: 1.0000 | LOZENGE | OROMUCOSAL | Status: DC | PRN

## 2014-02-28 MED ORDER — HYDROCODONE-ACETAMINOPHEN 5-325 MG PO TABS
1.0000 | ORAL_TABLET | ORAL | Status: DC | PRN
  Administered 2014-03-01 (×2): 2 via ORAL
  Filled 2014-02-28 (×2): qty 2

## 2014-02-28 MED ORDER — GLYCOPYRROLATE 0.2 MG/ML IJ SOLN
INTRAMUSCULAR | Status: AC
  Filled 2014-02-28: qty 2

## 2014-02-28 SURGICAL SUPPLY — 69 items
ADH SKN CLS LQ APL DERMABOND (GAUZE/BANDAGES/DRESSINGS) ×1
BAG DECANTER FOR FLEXI CONT (MISCELLANEOUS) ×2 IMPLANT
BLADE CLIPPER SURG (BLADE) IMPLANT
BONE MATRIX OSTEOCEL PRO MED (Bone Implant) ×2 IMPLANT
BUR MATCHSTICK NEURO 3.0 LAGG (BURR) ×2 IMPLANT
CAGE COROENT LG 10X9X23-12 (Cage) ×4 IMPLANT
CANISTER SUCT 3000ML (MISCELLANEOUS) ×2 IMPLANT
CONNECTOR RELINE-O 35-45 5.5 (Connector) ×1 IMPLANT
CONT SPEC 4OZ CLIKSEAL STRL BL (MISCELLANEOUS) ×4 IMPLANT
COVER BACK TABLE 60X90IN (DRAPES) ×2 IMPLANT
DECANTER SPIKE VIAL GLASS SM (MISCELLANEOUS) ×2 IMPLANT
DERMABOND ADHESIVE PROPEN (GAUZE/BANDAGES/DRESSINGS) ×1
DERMABOND ADVANCED .7 DNX6 (GAUZE/BANDAGES/DRESSINGS) IMPLANT
DRAPE C-ARM 42X72 X-RAY (DRAPES) ×5 IMPLANT
DRAPE LAPAROTOMY 100X72X124 (DRAPES) ×2 IMPLANT
DRAPE POUCH INSTRU U-SHP 10X18 (DRAPES) ×2 IMPLANT
DRAPE PROXIMA HALF (DRAPES) IMPLANT
DURAPREP 26ML APPLICATOR (WOUND CARE) ×2 IMPLANT
ELECT REM PT RETURN 9FT ADLT (ELECTROSURGICAL) ×2
ELECTRODE REM PT RTRN 9FT ADLT (ELECTROSURGICAL) ×1 IMPLANT
GAUZE SPONGE 4X4 12PLY STRL (GAUZE/BANDAGES/DRESSINGS) ×2 IMPLANT
GAUZE SPONGE 4X4 16PLY XRAY LF (GAUZE/BANDAGES/DRESSINGS) IMPLANT
GLOVE BIOGEL PI IND STRL 8.5 (GLOVE) ×2 IMPLANT
GLOVE BIOGEL PI INDICATOR 8.5 (GLOVE) ×2
GLOVE ECLIPSE 7.5 STRL STRAW (GLOVE) ×2 IMPLANT
GLOVE ECLIPSE 8.0 STRL XLNG CF (GLOVE) ×6 IMPLANT
GLOVE ECLIPSE 8.5 STRL (GLOVE) ×5 IMPLANT
GLOVE EXAM NITRILE LRG STRL (GLOVE) IMPLANT
GLOVE EXAM NITRILE MD LF STRL (GLOVE) IMPLANT
GLOVE EXAM NITRILE XL STR (GLOVE) IMPLANT
GLOVE EXAM NITRILE XS STR PU (GLOVE) IMPLANT
GLOVE INDICATOR 7.5 STRL GRN (GLOVE) ×1 IMPLANT
GLOVE INDICATOR 8.0 STRL GRN (GLOVE) ×3 IMPLANT
GOWN STRL REUS W/ TWL LRG LVL3 (GOWN DISPOSABLE) IMPLANT
GOWN STRL REUS W/ TWL XL LVL3 (GOWN DISPOSABLE) IMPLANT
GOWN STRL REUS W/TWL 2XL LVL3 (GOWN DISPOSABLE) ×5 IMPLANT
GOWN STRL REUS W/TWL LRG LVL3 (GOWN DISPOSABLE)
GOWN STRL REUS W/TWL XL LVL3 (GOWN DISPOSABLE) ×4
HEMOSTAT POWDER SURGIFOAM 1G (HEMOSTASIS) ×1 IMPLANT
KIT BASIN OR (CUSTOM PROCEDURE TRAY) ×2 IMPLANT
KIT ROOM TURNOVER OR (KITS) ×2 IMPLANT
LIQUID BAND (GAUZE/BANDAGES/DRESSINGS) ×2 IMPLANT
MILL MEDIUM DISP (BLADE) ×1 IMPLANT
NDL SPNL 18GX3.5 QUINCKE PK (NEEDLE) IMPLANT
NEEDLE HYPO 22GX1.5 SAFETY (NEEDLE) ×2 IMPLANT
NEEDLE SPNL 18GX3.5 QUINCKE PK (NEEDLE) IMPLANT
NS IRRIG 1000ML POUR BTL (IV SOLUTION) ×2 IMPLANT
PACK LAMINECTOMY NEURO (CUSTOM PROCEDURE TRAY) ×2 IMPLANT
PAD ARMBOARD 7.5X6 YLW CONV (MISCELLANEOUS) ×6 IMPLANT
PATTIES SURGICAL .5 X1 (DISPOSABLE) ×2 IMPLANT
ROD RELIN-O LORD 5.5X65MM (Rod) ×1 IMPLANT
ROD RELINE-O LORDOTIC 5.5X60MM (Rod) ×1 IMPLANT
SCREW LOCK RELINE 5.5 TULIP (Screw) ×6 IMPLANT
SCREW RELINE 5.5X35 POLYAXIAL (Screw) ×2 IMPLANT
SCREW RELINE-O POLY 6.5X45 (Screw) ×3 IMPLANT
SPONGE LAP 4X18 X RAY DECT (DISPOSABLE) IMPLANT
SPONGE SURGIFOAM ABS GEL 100 (HEMOSTASIS) ×2 IMPLANT
SUT VIC AB 1 CT1 18XBRD ANBCTR (SUTURE) ×1 IMPLANT
SUT VIC AB 1 CT1 8-18 (SUTURE) ×2
SUT VIC AB 2-0 CP2 18 (SUTURE) ×3 IMPLANT
SUT VIC AB 3-0 SH 8-18 (SUTURE) ×3 IMPLANT
SYR 20ML ECCENTRIC (SYRINGE) ×2 IMPLANT
SYR 3ML LL SCALE MARK (SYRINGE) ×8 IMPLANT
SYR 5ML LL (SYRINGE) IMPLANT
TOWEL OR 17X24 6PK STRL BLUE (TOWEL DISPOSABLE) ×2 IMPLANT
TOWEL OR 17X26 10 PK STRL BLUE (TOWEL DISPOSABLE) ×2 IMPLANT
TRAP SPECIMEN MUCOUS 40CC (MISCELLANEOUS) ×2 IMPLANT
TRAY FOLEY CATH 14FRSI W/METER (CATHETERS) ×2 IMPLANT
WATER STERILE IRR 1000ML POUR (IV SOLUTION) ×2 IMPLANT

## 2014-02-28 NOTE — Anesthesia Preprocedure Evaluation (Signed)
Anesthesia Evaluation  Patient identified by MRN, date of birth, ID band Patient awake    Reviewed: Allergy & Precautions, H&P , NPO status , Patient's Chart, lab work & pertinent test results  Airway Mallampati: I       Dental  (+) Teeth Intact   Pulmonary asthma , sleep apnea , former smoker,  breath sounds clear to auscultation        Cardiovascular hypertension, Rhythm:Regular Rate:Normal     Neuro/Psych    GI/Hepatic GERD-  ,  Endo/Other  diabetes  Renal/GU      Musculoskeletal  (+) Arthritis -, Fibromyalgia -  Abdominal   Peds  Hematology   Anesthesia Other Findings   Reproductive/Obstetrics                             Anesthesia Physical Anesthesia Plan  ASA: III  Anesthesia Plan: General   Post-op Pain Management:    Induction: Intravenous  Airway Management Planned: Oral ETT  Additional Equipment:   Intra-op Plan:   Post-operative Plan: Extubation in OR  Informed Consent: I have reviewed the patients History and Physical, chart, labs and discussed the procedure including the risks, benefits and alternatives for the proposed anesthesia with the patient or authorized representative who has indicated his/her understanding and acceptance.     Plan Discussed with:   Anesthesia Plan Comments:         Anesthesia Quick Evaluation

## 2014-02-28 NOTE — Anesthesia Postprocedure Evaluation (Signed)
  Anesthesia Post-op Note  Patient: [name]  Procedure(s) Performed: Procedure(s) with comments: LUMBAR THREE TO FOUR, LUMBAR FOUR TO FIVE DECOMPRESSION WITH POSTERIOR LUMBAR INTERBODY FUSION (N/A) - L3-4 L4-5 DECOMPRESSION WITH POSTERIOR LUMBAR INTERBODY FUSION  Patient Location: PACU  Anesthesia Type: General   Level of Consciousness: awake, alert  and oriented  Airway and Oxygen Therapy: Patient Spontanous Breathing  Post-op Pain: mild  Post-op Assessment: Post-op Vital signs reviewed  Post-op Vital Signs: Reviewed  Last Vitals:  Filed Vitals:   02/28/14 1830  BP:   Pulse: 78  Temp: 37.4 C  Resp: 17    Complications: No apparent anesthesia complications

## 2014-02-28 NOTE — Anesthesia Procedure Notes (Signed)
Procedures

## 2014-02-28 NOTE — Progress Notes (Signed)
eLink Physician-Brief Progress Note Patient Name: [name] DOB: 08/08/1940 MRN: [id number]   Date of Service  02/28/2014  HPI/Events of Note  73 yo woman, s/p L3-4, L4-5 laminectomies. Extubated successfully, comfortable, awake and stable. Hx DM, asthma, OSA not on CPAP. No interventions needed at this time  eICU Interventions       Intervention Category Evaluation Type: New Patient Evaluation  Cristopher Ciccarelli S. 02/28/2014, 8:53 PM

## 2014-02-28 NOTE — H&P (Signed)
[name] is an 73 y.o. female.   Chief Complaint: back and bilateral pain and weakness. Status post arthrodesis L5-S1 HPI: patient is a 73 year old individual who about 4 years ago underwent decompression and fusion at L5-S1 secondary to it spondylolisthesis that cause severe stenosis at that level. She did well for a period of time but then subsequently had recurrence of pain.she now has evidence of advancing spondylitic disease with a grade 1 spondylolisthesis at L4-L5 and severe stenosis at L3-4 and L4-L5 secondary to facet overgrowth and ligamentous redundancy. There is also broad-based central herniation of the disc.  Past Medical History  Diagnosis Date  . Complication of anesthesia     wakes up shaking  . Diabetes mellitus   . Heart murmur     sees Dr. Montez Morita, last seen- early 2012  . Asthma     has used inhaler in past for asthmatic bronchitis, last time- early 2012  . Sleep apnea     borderline sleep apnea, states she no longer uses, early 2012- stopped using   . GERD (gastroesophageal reflux disease)     occas. use of  Prilosec  . Neuromuscular disorder     lumbar radiculopathy, lumbago  . Fibromyalgia   . Arthritis   . Hypertension     02/2010- stress test /w PCP  . Anemia     has sickle cell trait    Past Surgical History  Procedure Laterality Date  . Back surgery      2012  . Abdominal hysterectomy    . Adb.cyst      ovarian cyst  . Eye surgery      macular degeneration treatment - injections  . Ovarian cyst surgery      Family History  Problem Relation Age of Onset  . Anesthesia problems Neg Hx   . Hypotension Neg Hx   . Malignant hyperthermia Neg Hx   . Pseudochol deficiency Neg Hx    Social History:  reports that she quit smoking about 14 years ago. She does not have any smokeless tobacco history on file. She reports that she drinks alcohol. She reports that she does not use illicit drugs.  Allergies:  Allergies  Allergen Reactions  .  Betadine [Povidone Iodine] Swelling    Reaction to betadine eye drops  . Adhesive [Tape] Rash  . Lipitor [Atorvastatin] Rash    Medications Prior to Admission  Medication Sig Dispense Refill  . alendronate (FOSAMAX) 70 MG tablet Take 70 mg by mouth once a week. On Sundays    . calcium carbonate (TUMS EX) 750 MG chewable tablet Chew 1 tablet by mouth 2 (two) times daily as needed for heartburn (indigestion).    . cetirizine (ZYRTEC) 10 MG tablet Take 10 mg by mouth daily.    . fluticasone (FLONASE) 50 MCG/ACT nasal spray Place 1 spray into both nostrils daily.     Marland Kitchen gabapentin (NEURONTIN) 100 MG capsule Take 200-300 mg by mouth 2 (two) times daily. Take 2 capsules (200 mg) every morning and 3 capsules (300 mg) at bedtime    . glimepiride (AMARYL) 2 MG tablet Take 2-4 mg by mouth 2 (two) times daily. Take 1 tablet (2 mg) every morning and 2 tablets (4 mg) every night    . HYDROcodone-acetaminophen (NORCO/VICODIN) 5-325 MG per tablet Take 1 tablet by mouth every 6 (six) hours as needed for moderate pain.    Marland Kitchen ibuprofen (ADVIL,MOTRIN) 800 MG tablet Take 800 mg by mouth 3 (three) times daily as needed (  pain).     . Insulin Glargine (LANTUS SOLOSTAR) 100 UNIT/ML Solostar Pen Inject 4-12 Units into the skin at bedtime. Inject 12 units every morning (reduce to 4 units if blood sugar was 90 that morning)    . insulin lispro (HUMALOG KWIKPEN) 100 UNIT/ML KiwkPen Inject 3-8 Units into the skin 3 (three) times daily. Inject 4 units before breakfast, 6-8 units before lunch/dinner (exept for salad: reduce to 3 units)    . Multiple Minerals (CALCIUM-MAGNESIUM-ZINC) TABS Take 1 tablet by mouth daily. Calcium 1000 mg, magnesium 400 mg, zinc 25 mg    . Olopatadine HCl (PATANASE) 0.6 % SOLN Place 1 puff into both nostrils 3 (three) times daily.    . potassium chloride SA (K-DUR,KLOR-CON) 20 MEQ tablet Take 1 tablet (20 mEq total) by mouth daily. 30 tablet 5  . pregabalin (LYRICA) 50 MG capsule Take 50 mg by mouth  2 (two) times daily.    Marland Kitchen pyridOXINE (VITAMIN B-6) 100 MG tablet Take 100 mg by mouth daily.    . rosuvastatin (CRESTOR) 10 MG tablet Take 10 mg by mouth at bedtime.    . valsartan-hydrochlorothiazide (DIOVAN-HCT) 320-12.5 MG per tablet Take 1 tablet by mouth daily.     . vitamin B-12 (CYANOCOBALAMIN) 100 MCG tablet Take 100 mcg by mouth daily.    . vitamin E 400 UNIT capsule Take 400 Units by mouth daily.    Marland Kitchen Dextromethorphan-Guaifenesin (TUSSIN DM PO) Take 5 mLs by mouth daily as needed (cough).    . Insulin Pen Needle (BD PEN NEEDLE NANO U/F) 32G X 4 MM MISC Use as directed 30 each 5  . Menthol, Topical Analgesic, (BIOFREEZE ROLL-ON EX) Apply 1 application topically daily as needed (pain).      Results for orders placed or performed during the hospital encounter of 02/28/14 (from the past 48 hour(s))  Glucose, capillary     Status: Abnormal   Collection Time: 02/28/14  8:29 AM  Result Value Ref Range   Glucose-Capillary 239 (H) 70 - 99 mg/dL   No results found.  Review of Systems  HENT: Negative.   Eyes: Negative.   Respiratory: Negative.   Cardiovascular: Negative.   Gastrointestinal: Negative.   Genitourinary: Negative.   Musculoskeletal: Positive for back pain.  Skin: Negative.   Neurological: Positive for sensory change, focal weakness and weakness.  Endo/Heme/Allergies: Negative.   Psychiatric/Behavioral: Negative.     Blood pressure 144/79, pulse 90, temperature 97.8 F (36.6 C), temperature source Oral, height 5\' 4"  (1.626 m), weight 76.658 kg (169 lb), SpO2 98 %. Physical Exam  Constitutional: She is oriented to person, place, and time. She appears well-developed and well-nourished.  HENT:  Head: Normocephalic and atraumatic.  Eyes: Conjunctivae and EOM are normal. Pupils are equal, round, and reactive to light.  Neck: Normal range of motion. Neck supple.  Cardiovascular: Normal rate and regular rhythm.   Respiratory: Effort normal and breath sounds normal.  GI:  Soft. Bowel sounds are normal.  Musculoskeletal:  Significant centralized back pain with redness on palpation and percussion at the lower lumbar spine.  Neurological: She is alert and oriented to person, place, and time.  Weakness in the iliopsoas and quadriceps bilaterally L4 minus out of 5. Absent patellar and Achilles reflexes.  Skin: Skin is dry.  Psychiatric: She has a normal mood and affect. Her behavior is normal. Judgment and thought content normal.     Assessment/Plan Degenerative spondylolisthesis L3-4 and L4-5 status post arthrodesis L5-S1 with neurogenic claudication, lumbar radiculopathy, centralized back  pain.  Patient to undergo surgical decompression arthrodesis at L3-4 and L4-5. Recent CT scan has been reviewed and concurs with the findings from an MRI performed in February 2015.  Bran Aldridge J 02/28/2014, 11:39 AM

## 2014-02-28 NOTE — Transfer of Care (Signed)
Immediate Anesthesia Transfer of Care Note  Patient: [name]  Procedure(s) Performed: Procedure(s) with comments: LUMBAR THREE TO FOUR, LUMBAR FOUR TO FIVE DECOMPRESSION WITH POSTERIOR LUMBAR INTERBODY FUSION (N/A) - L3-4 L4-5 DECOMPRESSION WITH POSTERIOR LUMBAR INTERBODY FUSION  Patient Location: PACU  Anesthesia Type:General  Level of Consciousness: awake  Airway & Oxygen Therapy: Patient Spontanous Breathing and Patient connected to nasal cannula oxygen  Post-op Assessment: Report given to PACU RN, Post -op Vital signs reviewed and stable and Patient moving all extremities  Post vital signs: Reviewed and stable  Complications: No apparent anesthesia complications

## 2014-02-28 NOTE — Op Note (Signed)
Date of surgery: 02/28/2014 Preoperative diagnosis: Spondylolisthesis and stenosis L3-4 L4-5 with lumbar radiculopathy, neurogenic claudication, status post arthrodesis L5-S1 for spondylolisthesis. Postoperative diagnosis: Spondylolisthesis and stenosis L3-4 L4-5 with lumbar radiculopathy, neurogenic claudication, status post arthrodesis L5-S1 for spondylolisthesis Procedure: Laminectomy of L3 and L4 with decompression of L3-4 and L4-5 spaces including specifically the L3-L4 and the L5 nerve roots bilaterally and laterally more works and required for interbody arthrodesis. Posterior lumbar interbody arthrodesis L3-4 and L4-5 with peek spacers local autograft and allograft. Segmental fixation L3-L5 with pedicle screws and rod fixation, posterior lateral arthrodesis with local autograft and allograft. Removal of previously placed hardware at L5-S1.  Surgeon: Kristeen Miss First Asst.: Jovita Gamma M.D. Anesthesia: Gen. Endotracheal Indications: Patient is a 73 year old individual who had an isthmic spondylolisthesis L5-S1 in the past. She underwent surgical decompression and fusion several years ago. She did well however as she has developed recurrent pain in the back and both lower extremities. She was found to have significant degenerative listhesis at L4-5 and slightly at L3-4 for both areas having a severe central canal stenosis. She was advised regarding the need for surgical decompression and stabilization.  Procedure: The patient was brought to the operating room supine on a stretcher. After the smooth induction of general endotracheal anesthesia, she was turned prone onto some vertical rolls. The bony prominences were appropriately padded and protected. The back was prepped with alcohol and DuraPrep and draped in a sterile fashion. The back was opened with a midline incision that was carried down to the lumbar dorsal fascia. Fascia was opened on either side of the midline. The previously placed  hardware was uncovered at the L5-S1 level. Were loosened and removed as were the rods and screws. The dissection then was continued from L5 cephalad to expose L4 and L3. The the lamina of L4 was removed completely. The lamina of L3 was removed out to and including the entirety of the facet joints at the L3-L4 interval. Superior portion of the pars was allowed to remain along with the superior portion laminar arch for articulation with L2 area then individually the L3-L4 and the L5 nerve roots were isolated there are noted to be significantly stenotic and no travel out the foramen. Thickened redundant yellow ligament was taken up with a 2 and 3 mm Kerrison punch to allow good decompression of each individual nerve. The disc spaces and this process were also isolated for later use of discectomy on both sides to perform posterior lumbar interbody arthrodesis. Once the decompression was completed. Posterior lumbar interbody arthrodesis was performed at L3-4 and L4-5 peek spacers measuring 10 mm in height and 12 of lordosis and 23 mm in length were used. These were packed with significant morcellized bone graft that was harvested from the laminectomy mixed with osseous cell. The interbody space was prepared by clearing the endplates with a toothed curettes and removing all remnants of the disc that were available from each of these apertures. Bone graft with osseous cell was then packed into the interspace is followed by the peek spacers. She was first accomplished at L4-5 and then at L3-4 care was taken then to make sure that each of the individual nerve roots L3-L4 and L5 were well protected. Once this was accomplished attention was turned to placement of pedicle screws.  Pedicle screws were placed in the pedicles at L3 and L4 these were 6.5 x 45 mm pedicle screws. At L5 medial to lateral pedicle screws had been placed and 5.5 x 35  mm screws were placed into this region. On the left side the screw was noted to have  a far lateral projection at the L4 space. This screw was removed but repositioning it yielded some cut out into the lateral recess. Therefore the screw was left out of the construct. 260 mm long precontoured rods were then used to connect the pedicle screws from L3 to L4-L5. The lateral gutters had been previously decorticated and packed away and were now packed with the remainder of autograft and allograft. A transverse connector was placed between the left and right side to add some additional stability. Hemostasis in the soft tissues was then carefully checked. Blood loss is estimated at 450 mL. 150 mL of Cell Saver blood was returned to the patient. Patient tolerated the procedure well and returned to recovery room in stable condition after closing the lumbar wound in layers with #1 Vicryl in the lumbar dorsal fascia 2-0 Vicryl subcutaneous tissues and 3-0 Vicryl subcuticularly. 20 mL of half percent Marcaine was injected into the paraspinous fascia at the time of closure.

## 2014-03-01 LAB — COMPREHENSIVE METABOLIC PANEL
ALBUMIN: 3.4 g/dL — AB (ref 3.5–5.2)
ALT: 16 U/L (ref 0–35)
AST: 31 U/L (ref 0–37)
Alkaline Phosphatase: 34 U/L — ABNORMAL LOW (ref 39–117)
Anion gap: 13 (ref 5–15)
BUN: 16 mg/dL (ref 6–23)
CHLORIDE: 102 meq/L (ref 96–112)
CO2: 27 mEq/L (ref 19–32)
CREATININE: 0.84 mg/dL (ref 0.50–1.10)
Calcium: 8.8 mg/dL (ref 8.4–10.5)
GFR calc Af Amer: 77 mL/min — ABNORMAL LOW (ref >90)
GFR calc non Af Amer: 67 mL/min — ABNORMAL LOW (ref >90)
Glucose, Bld: 184 mg/dL — ABNORMAL HIGH (ref 70–99)
Potassium: 4.3 mEq/L (ref 3.7–5.3)
SODIUM: 142 meq/L (ref 137–147)
Total Bilirubin: 0.3 mg/dL (ref 0.3–1.2)
Total Protein: 5.5 g/dL — ABNORMAL LOW (ref 6.0–8.3)

## 2014-03-01 LAB — GLUCOSE, CAPILLARY
GLUCOSE-CAPILLARY: 205 mg/dL — AB (ref 70–99)
Glucose-Capillary: 185 mg/dL — ABNORMAL HIGH (ref 70–99)
Glucose-Capillary: 225 mg/dL — ABNORMAL HIGH (ref 70–99)
Glucose-Capillary: 236 mg/dL — ABNORMAL HIGH (ref 70–99)
Glucose-Capillary: 246 mg/dL — ABNORMAL HIGH (ref 70–99)

## 2014-03-01 LAB — CBC
HCT: 31.1 % — ABNORMAL LOW (ref 36.0–46.0)
Hemoglobin: 10.3 g/dL — ABNORMAL LOW (ref 12.0–15.0)
MCH: 26.8 pg (ref 26.0–34.0)
MCHC: 33.1 g/dL (ref 30.0–36.0)
MCV: 80.8 fL (ref 78.0–100.0)
PLATELETS: 157 10*3/uL (ref 150–400)
RBC: 3.85 MIL/uL — AB (ref 3.87–5.11)
RDW: 14.1 % (ref 11.5–15.5)
WBC: 8.3 10*3/uL (ref 4.0–10.5)

## 2014-03-01 MED ORDER — INSULIN ASPART 100 UNIT/ML ~~LOC~~ SOLN
0.0000 [IU] | Freq: Three times a day (TID) | SUBCUTANEOUS | Status: DC
  Administered 2014-03-01 – 2014-03-02 (×2): 3 [IU] via SUBCUTANEOUS
  Administered 2014-03-02: 5 [IU] via SUBCUTANEOUS
  Administered 2014-03-02: 2 [IU] via SUBCUTANEOUS
  Administered 2014-03-03: 7 [IU] via SUBCUTANEOUS
  Administered 2014-03-03 (×2): 2 [IU] via SUBCUTANEOUS
  Administered 2014-03-04: 1 [IU] via SUBCUTANEOUS

## 2014-03-01 MED FILL — Sodium Chloride Irrigation Soln 0.9%: Qty: 3000 | Status: AC

## 2014-03-01 MED FILL — Sodium Chloride IV Soln 0.9%: INTRAVENOUS | Qty: 1000 | Status: AC

## 2014-03-01 MED FILL — Heparin Sodium (Porcine) Inj 1000 Unit/ML: INTRAMUSCULAR | Qty: 30 | Status: AC

## 2014-03-01 NOTE — Progress Notes (Signed)
Subjective: Patient reports feels reasonably comfortable. Legs do not hurt. Back with modest soreness.  Objective: Vital signs in last 24 hours: Temp:  [98.4 F (36.9 C)-99.3 F (37.4 C)] 98.8 F (37.1 C) (11/10 0300) Pulse Rate:  [76-118] 88 (11/10 0735) Resp:  [9-24] 18 (11/10 1000) BP: (93-136)/(48-99) 109/48 mmHg (11/10 1000) SpO2:  [92 %-100 %] 100 % (11/10 0735)  Intake/Output from previous day: 11/09 0701 - 11/10 0700 In: 4505 [P.O.:480; I.V.:3525; Blood:150; IV Piggyback:350] Out: 2715 [Urine:2265; Blood:450] Intake/Output this shift:    incision is clean and dry. Motor function lower extremities reveals iliopsoas quadriceps tibialis anterior and gastrocs are 4+ out of 5.  Lab Results:  Recent Labs  03/01/14 0200  WBC 8.3  HGB 10.3*  HCT 31.1*  PLT 157   BMET  Recent Labs  03/01/14 0200  NA 142  K 4.3  CL 102  CO2 27  GLUCOSE 184*  BUN 16  CREATININE 0.84  CALCIUM 8.8    Studies/Results: Dg Lumbar Spine 2-3 Views  02/28/2014   CLINICAL DATA:  L3-L5 posterior fusion.  EXAM: LUMBAR SPINE - 2-3 VIEW  COMPARISON:  02/28/2014.  FINDINGS: Intraoperative spot images obtained reveal L3 through L5 posterior and interbody fusion with good anatomic alignment. No left L3 screw noted. Lumbar vertebra are numbered with the lowest segmented appearing lumbar vertebra as L5.  IMPRESSION: L3 through L5 posterior interbody fusion as described above.   Electronically Signed   By: Marcello Moores  Register   On: 02/28/2014 17:07   Dg C-arm 1-60 Min  02/28/2014   CLINICAL DATA:  L3 through L5 posterior fusion.  EXAM: DG C-ARM 61-120 MIN  TECHNIQUE: Intraoperative spot studies obtained.  CONTRAST:  None.  FLUOROSCOPY TIME:  0 min 31 seconds.  COMPARISON:  None.  FINDINGS: L3-L5 posterior and interbody fusion with good anatomic alignment. No left L3 screw noted. No acute bony abnormality. Lumbar vertebra are numbered with the lowest segmented appearing lumbar vertebra as L5.  IMPRESSION:  L3 through L5 posterior and interbody fusion as described above.   Electronically Signed   By: Marcello Moores  Register   On: 02/28/2014 17:08    Assessment/Plan: Stable postop day 1. Labs are stable postoperatively.   LOS: 1 day  vital signs are stable transferred to the floor.   [name] J 03/01/2014, 3:20 PM

## 2014-03-01 NOTE — Evaluation (Signed)
Physical Therapy Evaluation Patient Details Name: [name] MRN: [id number] DOB: Jul 12, 1940 Today's Date: 03/01/2014   History of Present Illness  pt presents with L3-5 PLIF.  pt reports this is her 3rd back surgery.    Clinical Impression  Pt mobility limited by pain this am.  At this time requiring A for all mobility.  Feel pt will make great progress and pt requests to D/C to [facility] as she went there after previous back surgery.  Will continue to follow.      Follow Up Recommendations SNF    Equipment Recommendations  None recommended by PT    Recommendations for Other Services       Precautions / Restrictions Precautions Precautions: Back;Fall Precaution Booklet Issued: Yes (comment) Required Braces or Orthoses: Spinal Brace Spinal Brace: Lumbar corset;Applied in sitting position Restrictions Weight Bearing Restrictions: No      Mobility  Bed Mobility Overal bed mobility: Needs Assistance Bed Mobility: Rolling;Sidelying to Sit Rolling: Min guard Sidelying to sit: Min assist       General bed mobility comments: pt recalls log roll technique from previous back surgery.  A for bringing trunk up to sitting.    Transfers Overall transfer level: Needs assistance Equipment used: None Transfers: Sit to/from Stand Sit to Stand: Min assist         General transfer comment: cues for UE use.  pt needs increased time 2/2 pain.    Ambulation/Gait Ambulation/Gait assistance: Min assist Ambulation Distance (Feet): 6 Feet (x2) Assistive device: 1 person hand held assist Gait Pattern/deviations: Step-through pattern;Decreased stride length     General Gait Details: A for steadying.  would benefit from trial RW next session.    Stairs            Wheelchair Mobility    Modified Rankin (Stroke Patients Only)       Balance Overall balance assessment: Needs assistance Sitting-balance support: No upper extremity supported;Feet supported Sitting  balance-Leahy Scale: Fair     Standing balance support: Single extremity supported;During functional activity Standing balance-Leahy Scale: Poor                               Pertinent Vitals/Pain Pain Assessment: 0-10 Pain Score: 9  Pain Location: Back Pain Descriptors / Indicators: Throbbing;Sore Pain Intervention(s): Monitored during session;Repositioned;Patient requesting pain meds-RN notified    Home Living Family/patient expects to be discharged to:: Skilled nursing facility                 Additional Comments: pt reports D/C to [facility] after previous back surgery and would like return to [place] at D/C.      Prior Function Level of Independence: Independent               Hand Dominance        Extremity/Trunk Assessment   Upper Extremity Assessment: Defer to OT evaluation           Lower Extremity Assessment: Generalized weakness      Cervical / Trunk Assessment: Normal  Communication   Communication: No difficulties  Cognition Arousal/Alertness: Awake/alert Behavior During Therapy: WFL for tasks assessed/performed Overall Cognitive Status: Within Functional Limits for tasks assessed                      General Comments      Exercises        Assessment/Plan    PT Assessment Patient  needs continued PT services  PT Diagnosis Difficulty walking;Acute pain   PT Problem List Decreased strength;Decreased activity tolerance;Decreased balance;Decreased mobility;Decreased knowledge of use of DME;Decreased knowledge of precautions;Pain  PT Treatment Interventions DME instruction;Gait training;Stair training;Functional mobility training;Therapeutic activities;Therapeutic exercise;Balance training;Neuromuscular re-education;Patient/family education   PT Goals (Current goals can be found in the Care Plan section) Acute Rehab PT Goals Patient Stated Goal: Get stronger before going home.   PT Goal Formulation: With  patient/family Time For Goal Achievement: 03/08/14 Potential to Achieve Goals: Good    Frequency Min 5X/week   Barriers to discharge        Co-evaluation               End of Session Equipment Utilized During Treatment: Gait belt;Back brace Activity Tolerance: Patient limited by pain Patient left: in chair (with OT) Nurse Communication: Mobility status         Time: 8916-9450 PT Time Calculation (min) (ACUTE ONLY): 19 min   Charges:   PT Evaluation $Initial PT Evaluation Tier I: 1 Procedure PT Treatments $Gait Training: 8-22 mins   PT G CodesCatarina Hartshorn, [place] 03/01/2014, 11:09 AM

## 2014-03-01 NOTE — Progress Notes (Signed)
Inpatient Diabetes Program Recommendations  AACE/ADA: New Consensus Statement on Inpatient Glycemic Control (2013)  Target Ranges:  Prepandial:   less than 140 mg/dL      Peak postprandial:   less than 180 mg/dL (1-2 hours)      Critically ill patients:  140 - 180 mg/dL   Reason for Visit: elevated blood sugars: 236, 184, 185, 205 Diabetes history: [place] 2 Outpatient Diabetes medications: H/P states that patient takes Lantus 4-12 units @ bedtime; Lantus 12 units in the morning  (4 units if BS less than 90); Humalog 3-8 units tid; Amaryl 2mg  in am and 4 mg in pm Current orders for Inpatient glycemic control:Lantus 8 units in pm AND Lantus 12 units in pm.   Sensitive correction scale q 4 hrs. Amaryl 2mg  in am and 4mg  in pm. Spoke with nurse to alert of 2 different orders for Lantus in the pm and request correction be change to tid and hs scale since patient is now on CHO mod diet. May need to add meal coverage based on home meds.   [place], CDE. M.Ed. Pager [phone] Inpatient Diabetes Coordinator

## 2014-03-01 NOTE — Progress Notes (Signed)
Occupational Therapy Evaluation Patient Details Name: [name] MRN: [id number] DOB: 1940-05-03 Today's Date: 03/01/2014    History of Present Illness pt presents with L3-5 PLIF.  pt reports this is her 3rd back surgery.     Clinical Impression   PTA, pt independent with mobility and ADL. Pt currently requires min A for mobility and mod A for LB ADL. Pt making good progress. Requests D/C to [facility] for rehab. Will follow to assess prgoress and if pt will be able to D/C home rather than SNF.     Follow Up Recommendations  SNF;Supervision/Assistance - 24 hour    Equipment Recommendations  3 in 1 bedside comode    Recommendations for Other Services       Precautions / Restrictions Precautions Precautions: Back;Fall Precaution Booklet Issued: Yes (comment) Required Braces or Orthoses: Spinal Brace Spinal Brace: Lumbar corset;Applied in sitting position Restrictions Weight Bearing Restrictions: No      Mobility Bed Mobility Overal bed mobility: Needs Assistance Bed Mobility: Rolling;Sidelying to Sit Rolling: Min guard Sidelying to sit: Min assist       General bed mobility comments: completed with PT prior to session  Transfers Overall transfer level: Needs assistance Equipment used: None Transfers: Sit to/from [facility] Sit to Stand: Min assist Stand pivot transfers: Min assist       General transfer comment: cues for UE use.  pt needs increased time 2/2 pain.      Balance Overall balance assessment: Needs assistance Sitting-balance support: No upper extremity supported;Feet supported Sitting balance-Leahy Scale: Fair     Standing balance support: Single extremity supported;During functional activity Standing balance-Leahy Scale: Poor                              ADL Overall ADL's : Needs assistance/impaired     Grooming: Set up;Supervision/safety;Sitting   Upper Body Bathing: Set up;Supervision/ safety;Sitting    Lower Body Bathing: Moderate assistance;Sit to/from stand   Upper Body Dressing : Sitting;Minimal assistance Upper Body Dressing Details (indicate cue type and reason): assistance for donning brace Lower Body Dressing: Moderate assistance;Sit to/from stand   Toilet Transfer: Minimal assistance;Ambulation;Comfort height toilet   Toileting- Clothing Manipulation and Hygiene: Maximal assistance;Sit to/from stand       Functional mobility during ADLs: Minimal assistance;Cueing for safety;Cueing for sequencing General ADL Comments: Pt used AE after previous back surgeries. Still has reacher. discussed options for use of AE for hygiene after toileting. Also discussed options of DME for toilet inclusing toilet riser and 3 in 1. Informed pt that insurance wouldnot payfor toilet riser     Vision                     Perception     Praxis      Pertinent Vitals/Pain Pain Assessment: 0-10 Pain Score: 9  Pain Location: back Pain Descriptors / Indicators: Aching Pain Intervention(s): Limited activity within patient's tolerance;Repositioned;RN gave pain meds during session     Hand Dominance     Extremity/Trunk Assessment Upper Extremity Assessment Upper Extremity Assessment: Overall WFL for tasks assessed   Lower Extremity Assessment Lower Extremity Assessment: Defer to PT evaluation   Cervical / Trunk Assessment Cervical / Trunk Assessment: Normal   Communication Communication Communication: No difficulties   Cognition Arousal/Alertness: Awake/alert Behavior During Therapy: WFL for tasks assessed/performed Overall Cognitive Status: Within Functional Limits for tasks assessed  General Comments   Pt has option of 24/7 S when sisters come into town.     Exercises       Shoulder Instructions      Home Living Family/patient expects to be discharged to:: Skilled nursing facility                                 Additional  Comments: pt reports D/C to [facility] after previous back surgery and would like return to [place] at D/C if needed.        Prior Functioning/Environment Level of Independence: Independent             OT Diagnosis: Generalized weakness;Acute pain   OT Problem List: Decreased strength;Decreased activity tolerance;Impaired balance (sitting and/or standing);Decreased safety awareness;Decreased knowledge of use of DME or AE;Decreased knowledge of precautions;Pain   OT Treatment/Interventions: Self-care/ADL training;DME and/or AE instruction;Therapeutic activities;Patient/family education;Balance training    OT Goals(Current goals can be found in the care plan section) Acute Rehab OT Goals Patient Stated Goal: Get stronger before going home.   OT Goal Formulation: With patient Time For Goal Achievement: 03/15/14 Potential to Achieve Goals: Good  OT Frequency: Min 2X/week   Barriers to D/C:            Co-evaluation              End of Session Equipment Utilized During Treatment: Gait belt Nurse Communication: Mobility status;Precautions  Activity Tolerance: Patient tolerated treatment well Patient left: in chair;with call bell/phone within reach;with family/visitor present   Time: 1030-1051 OT Time Calculation (min): 21 min Charges:  OT General Charges $OT Visit: 1 Procedure OT Evaluation $Initial OT Evaluation Tier I: 1 Procedure OT Treatments $Self Care/Home Management : 8-22 mins G-Codes:    Johnnathan Hagemeister,HILLARY 2014/03/25, 11:42 AM   Maurie Boettcher, OTR/L  [phone] 25-Mar-2014

## 2014-03-01 NOTE — Progress Notes (Signed)
Utilization review completed.  

## 2014-03-02 LAB — GLUCOSE, CAPILLARY
GLUCOSE-CAPILLARY: 262 mg/dL — AB (ref 70–99)
GLUCOSE-CAPILLARY: 170 mg/dL — AB (ref 70–99)
GLUCOSE-CAPILLARY: 219 mg/dL — AB (ref 70–99)
GLUCOSE-CAPILLARY: 185 mg/dL — AB (ref 70–99)
Glucose-Capillary: 219 mg/dL — ABNORMAL HIGH (ref 70–99)

## 2014-03-02 MED ORDER — MAGNESIUM HYDROXIDE 400 MG/5ML PO SUSP
30.0000 mL | Freq: Every day | ORAL | Status: DC | PRN
  Administered 2014-03-02: 30 mL via ORAL
  Filled 2014-03-02: qty 30

## 2014-03-02 NOTE — Progress Notes (Signed)
Patient ID: [name], female   DOB: 1940-09-27, 73 y.o.   MRN: [id number] Vital signs are stable. Motor function appears intact Less back pain then yesterday Patient's abdomen appears distended Has been passing gas Encourage laxative use for bowel movement.

## 2014-03-02 NOTE — Progress Notes (Signed)
Physical Therapy Treatment Patient Details Name: [name] MRN: [id number] DOB: 01-30-1941 Today's Date: 03/02/2014    History of Present Illness pt presents with L3-5 PLIF.  pt reports this is her 3rd back surgery.      PT Comments    Patient is progressing well with ambulation and use of RW. Patient requiring increased time for all mobility due to pain. Continue to recommend SNF for ongoing Physical Therapy.     Follow Up Recommendations  SNF     Equipment Recommendations  None recommended by PT    Recommendations for Other Services       Precautions / Restrictions Precautions Precautions: Back;Fall Required Braces or Orthoses: Spinal Brace Spinal Brace: Lumbar corset;Applied in sitting position Restrictions Weight Bearing Restrictions: No    Mobility  Bed Mobility Overal bed mobility: Needs Assistance   Rolling: Min guard Sidelying to sit: Min assist       General bed mobility comments: Min A for LE back into bed. Cues for postioning  Transfers Overall transfer level: Needs assistance Equipment used: None   Sit to Stand: Min assist         General transfer comment: cues for UE use.  pt needs increased time 2/2 pain.    Ambulation/Gait Ambulation/Gait assistance: Min guard Ambulation Distance (Feet): 50 Feet Assistive device: Rolling walker (2 wheeled) Gait Pattern/deviations: Step-through pattern;Decreased stride length   Gait velocity interpretation: Below normal speed for age/gender General Gait Details: Cues for positioning of RW. Guarded due to pain   Stairs            Wheelchair Mobility    Modified Rankin (Stroke Patients Only)       Balance                                    Cognition Arousal/Alertness: Awake/alert Behavior During Therapy: WFL for tasks assessed/performed Overall Cognitive Status: Within Functional Limits for tasks assessed                      Exercises      General  Comments        Pertinent Vitals/Pain Pain Score: 8  Pain Location: back Pain Descriptors / Indicators: Aching Pain Intervention(s): Limited activity within patient's tolerance;Repositioned    Home Living                      Prior Function            PT Goals (current goals can now be found in the care plan section) Progress towards PT goals: Progressing toward goals    Frequency  Min 5X/week    PT Plan Current plan remains appropriate    Co-evaluation             End of Session Equipment Utilized During Treatment: Gait belt;Back brace Activity Tolerance: Patient limited by pain Patient left: with call bell/phone within reach;in bed     Time: 1422-1501 PT Time Calculation (min) (ACUTE ONLY): 39 min  Charges:  $Gait Training: 23-37 mins $Therapeutic Activity: 8-22 mins                    G Codes:      Jacqualyn Posey 03/02/2014, 3:08 PM 03/02/2014 Jacqualyn Posey PTA [phone] pager [phone] office

## 2014-03-02 NOTE — Progress Notes (Signed)
Inpatient Diabetes Program Recommendations  AACE/ADA: New Consensus Statement on Inpatient Glycemic Control  Target Ranges:  Prepandial:   less than 140 mg/dL      Peak postprandial:   less than 180 mg/dL (1-2 hours)      Critically ill patients:  140 - 180 mg/dL  Pager:  [phone] Hours:  8 am-10pm   Reason for Visit: Elevated glucose:  Results for [name], [name] (MRN [id number]) as of 03/02/2014 07:41  Ref. Range 03/01/2014 08:55 03/01/2014 11:47 03/01/2014 17:04 03/01/2014 22:27 03/02/2014 06:28  Glucose-Capillary Latest Range: 70-99 mg/dL 205 (H) 246 (H) 225 (H) 219 (H) 262 (H)    Inpatient Diabetes Program Recommendations Insulin - Basal: Discontinue one of the Lantus orders. Insulin - Meal Coverage: Add Novolog 3 units TID for meal coverage  Courtney Heys PhD, RN, BC-ADM Diabetes Coordinator  Office:  [phone] Team Pager:  [phone]

## 2014-03-02 NOTE — Clinical Social Work Psychosocial (Addendum)
Clinical Social Work Department BRIEF PSYCHOSOCIAL ASSESSMENT 03/02/2014  Patient:  [name], [name]     Account Number:  [id number]     Admit date:  02/28/2014  Clinical Social Worker:  Glendon Axe, CLINICAL SOCIAL WORKER  Date/Time:  03/02/2014 10:51 AM  Referred by:  Physician  Date Referred:  03/02/2014 Referred for  SNF Placement   Other Referral:   Interview type:  Patient Other interview type:    PSYCHOSOCIAL DATA Living Status:  ALONE Admitted from facility:   Level of care:   Primary support name:  Gordan Payment [phone] Primary support relationship to patient:  CHILD, ADULT Degree of support available:   Strong    CURRENT CONCERNS Current Concerns  Post-Acute Placement   Other Concerns:    SOCIAL WORK ASSESSMENT / PLAN Clinical Social Worker met with pt in reference to post-acute placement for SNF. Pt stated she has been to [facility] in the past and was very pleased with the care. Pt wishes to return to [facility]. CSW explained SNF process. Pt's clinicals already submitted. [place] will accept pt once discharged. FL-2 on chart for MD signature. CSW will continue to follow to provide support and facilitate pt's discharge needs once medically stable.   Assessment/plan status:  Psychosocial Support/Ongoing Assessment of Needs Other assessment/ plan:   Information/referral to community resources:   SNF information/ list.    PATIENT'S/FAMILY'S RESPONSE TO PLAN OF CARE: Pt lying in bed, alert and oriented. Pt agreeable to SNF placement and stated she has been to [facility] in the past. Pt expressed positive experience at [facility]. Pt smiling and stated she is also pleased with the nursing/staff care from the unit.    Glendon Axe, MSW, LCSWA [phone] 03/02/2014 11:00 AM

## 2014-03-02 NOTE — Clinical Social Work Placement (Addendum)
Clinical Social Work Department CLINICAL SOCIAL WORK PLACEMENT NOTE 03/02/2014  Patient:  [name], [name]  Account Number:  [id number] Admit date:  02/28/2014  Clinical Social Worker:  Glendon Axe, CLINICAL SOCIAL WORKER  Date/time:  03/02/2014 11:01 AM  Clinical Social Work is seeking post-discharge placement for this patient at the following level of care:   SKILLED NURSING   (*CSW will update this form in Epic as items are completed)   03/01/2014  Patient/family provided with [place] Department of Clinical Social Work's list of facilities offering this level of care within the geographic area requested by the patient (or if unable, by the patient's family).  03/01/2014  Patient/family informed of their freedom to choose among providers that offer the needed level of care, that participate in Medicare, Medicaid or managed care program needed by the patient, have an available bed and are willing to accept the patient.  03/01/2014  Patient/family informed of [facility]' ownership interest in [facility], as well as of the fact that they are under no obligation to receive care at this facility.  PASARR submitted to EDS on 03/01/2014 PASARR number received on 03/01/2014  FL2 transmitted to all facilities in geographic area requested by pt/family on  03/01/2014 FL2 transmitted to all facilities within larger geographic area on   Patient informed that his/her managed care company has contracts with or will negotiate with  certain facilities, including the following:   YES     Patient/family informed of bed offers received:  03/02/2014 Patient chooses bed at [place] Physician recommends and patient chooses bed at    Patient to be transferred to [facility]  On 03/04/2014   Patient to be transferred to facility by [facility]   Patient and family notified of transfer on 03/04/2014 Name of family member notified: Pt's daughter, Sherri  The following  physician request were entered in Epic:   Additional Comments:   Glendon Axe, MSW, [place] [phone] 03/02/2014 11:02 AM

## 2014-03-03 LAB — GLUCOSE, CAPILLARY
GLUCOSE-CAPILLARY: 179 mg/dL — AB (ref 70–99)
Glucose-Capillary: 161 mg/dL — ABNORMAL HIGH (ref 70–99)
Glucose-Capillary: 349 mg/dL — ABNORMAL HIGH (ref 70–99)
Glucose-Capillary: 207 mg/dL — ABNORMAL HIGH (ref 70–99)

## 2014-03-03 MED ORDER — METHOCARBAMOL 500 MG PO TABS: 500.0000 mg | ORAL_TABLET | Freq: Four times a day (QID) | ORAL | Status: DC | PRN

## 2014-03-03 MED ORDER — BISACODYL 10 MG RE SUPP
10.0000 mg | Freq: Every day | RECTAL | Status: DC | PRN
  Administered 2014-03-03: 10 mg via RECTAL
  Filled 2014-03-03: qty 1

## 2014-03-03 MED ORDER — FLEET ENEMA 7-19 GM/118ML RE ENEM: 1.0000 | ENEMA | Freq: Every day | RECTAL | Status: DC | PRN

## 2014-03-03 MED ORDER — OXYCODONE-ACETAMINOPHEN 5-325 MG PO TABS: 1.0000 | ORAL_TABLET | ORAL | Status: DC | PRN

## 2014-03-03 NOTE — Progress Notes (Signed)
Physical Therapy Treatment Patient Details Name: [name] MRN: [id number] DOB: [birth year] Today's Date: 03/03/2014    History of Present Illness pt presents with L3-5 PLIF.  pt reports this is her 3rd back surgery.      PT Comments    Patient continues to required increased time due to pain but otherwise able to increase ambulation. Continues to recommend SNF for ongoing therapy to regain functional independence prior to returning home.   Follow Up Recommendations  SNF     Equipment Recommendations  None recommended by PT    Recommendations for Other Services       Precautions / Restrictions Precautions Precautions: Back;Fall Precaution Comments: Patient able to recall all precautions. Cues for no arching in the recliner Required Braces or Orthoses: Spinal Brace Spinal Brace: Lumbar corset;Applied in sitting position Restrictions Weight Bearing Restrictions: No    Mobility  Bed Mobility               General bed mobility comments: Patient up in recliner before and after session  Transfers Overall transfer level: Needs assistance Equipment used: None   Sit to Stand: Min assist         General transfer comment: Min A from lower surface. Patient with correct technique. Increased time due to pain  Ambulation/Gait Ambulation/Gait assistance: Min guard Ambulation Distance (Feet): 60 Feet (x2) Assistive device: Rolling walker (2 wheeled) Gait Pattern/deviations: Step-through pattern;Decreased stride length Gait velocity: very decreased Gait velocity interpretation: Below normal speed for age/gender General Gait Details: Cues for positioning of RW. Guarded due to pain and required increased time. Patient stated that she couldn't walk any faster. One seated rest break   Stairs            Wheelchair Mobility    Modified Rankin (Stroke Patients Only)       Balance                                    Cognition  Arousal/Alertness: Awake/alert Behavior During Therapy: WFL for tasks assessed/performed Overall Cognitive Status: Within Functional Limits for tasks assessed                      Exercises      General Comments        Pertinent Vitals/Pain Pain Score: 8  Pain Location: back Pain Descriptors / Indicators: Aching Pain Intervention(s): Monitored during session    Home Living                      Prior Function            PT Goals (current goals can now be found in the care plan section) Progress towards PT goals: Progressing toward goals    Frequency  Min 5X/week    PT Plan Current plan remains appropriate    Co-evaluation             End of Session Equipment Utilized During Treatment: Back brace Activity Tolerance: Patient limited by pain Patient left: in chair;with call bell/phone within reach     Time: 0825-0905 PT Time Calculation (min) (ACUTE ONLY): 40 min  Charges:  $Gait Training: 23-37 mins $Therapeutic Activity: 8-22 mins                    G Codes:      Jacqualyn Posey 03/03/2014, 9:09 AM  03/03/2014  Jacqualyn Posey PTA [phone] pager [phone] office

## 2014-03-03 NOTE — Discharge Summary (Signed)
Physician Discharge Summary  Patient ID: [name] MRN: [id number] DOB/AGE: [birth year] 73 y.o.  Admit date: 02/28/2014 Discharge date: 03/03/2014  Admission Diagnoses:lumbar spondylosis L3-4 L4-5 with stenosis, lumbar radiculopathy, status post arthrodesis L5-S1  Discharge Diagnoses: lumbar spondylosis L3-4 L4-5 stenosis, lumbar radiculopathy, status post arthrodesis L5-S1 Active Problems:   Spinal stenosis of lumbar region with radiculopathy   Discharged Condition: fair  Hospital Course: patient is a 73 year old individual who's had significant back and bilateral leg pain with weakness and loss of stamina on her feet. She had failed efforts at conservative management. She has severe stenosis at L3-4 and L4-5 above a previously performed fusion at L5-S1 performed in 2010 for severe stenosis. She did well with surgery. She has issues with constipation. This is improving. She is transferred to a skilled nursing facility to undergo further intensive inpatient rehabilitation, as she lives alone.  Consults: None  Significant Diagnostic Studies: MRI lumbar spine  Treatments: surgery: decompression of L3 and L4 with decompression of L3-L4 and L5 nerve roots posterior lumbar interbody arthrodesis L3-4 and L4-5 posterior segmental fixation L3-L5.  Discharge Exam: Blood pressure 110/56, pulse 100, temperature 99.8 F (37.7 C), temperature source Oral, resp. rate 18, height 5\' 4"  (1.626 m), weight 76.658 kg (169 lb), SpO2 96 %. incision is clean and dry. Motor function is good in iliopsoas quadriceps tibialis anterior and gastrocs. Station and gait are intact but very slow.  Disposition: 03-Skilled Nursing Facility  Discharge Instructions    Call MD for:  redness, tenderness, or signs of infection (pain, swelling, redness, odor or green/yellow discharge around incision site)    Complete by:  As directed      Call MD for:  severe uncontrolled pain    Complete by:  As directed      Call  MD for:  temperature >100.4    Complete by:  As directed      Diet - low sodium heart healthy    Complete by:  As directed      Increase activity slowly    Complete by:  As directed             Medication List    TAKE these medications        alendronate 70 MG tablet  Commonly known as:  FOSAMAX  Take 70 mg by mouth once a week. On Sundays     BIOFREEZE ROLL-ON EX  Apply 1 application topically daily as needed (pain).     calcium carbonate 750 MG chewable tablet  Commonly known as:  TUMS EX  Chew 1 tablet by mouth 2 (two) times daily as needed for heartburn (indigestion).     Calcium-Magnesium-Zinc Tabs  Take 1 tablet by mouth daily. Calcium 1000 mg, magnesium 400 mg, zinc 25 mg     cetirizine 10 MG tablet  Commonly known as:  ZYRTEC  Take 10 mg by mouth daily.     fluticasone 50 MCG/ACT nasal spray  Commonly known as:  FLONASE  Place 1 spray into both nostrils daily.     gabapentin 100 MG capsule  Commonly known as:  NEURONTIN  Take 200-300 mg by mouth 2 (two) times daily. Take 2 capsules (200 mg) every morning and 3 capsules (300 mg) at bedtime     glimepiride 2 MG tablet  Commonly known as:  AMARYL  Take 2-4 mg by mouth 2 (two) times daily. Take 1 tablet (2 mg) every morning and 2 tablets (4 mg) every night     HUMALOG  KWIKPEN 100 UNIT/ML KiwkPen  Generic drug:  insulin lispro  Inject 3-8 Units into the skin 3 (three) times daily. Inject 4 units before breakfast, 6-8 units before lunch/dinner (exept for salad: reduce to 3 units)     HYDROcodone-acetaminophen 5-325 MG per tablet  Commonly known as:  NORCO/VICODIN  Take 1 tablet by mouth every 6 (six) hours as needed for moderate pain.     ibuprofen 800 MG tablet  Commonly known as:  ADVIL,MOTRIN  Take 800 mg by mouth 3 (three) times daily as needed (pain).     Insulin Pen Needle 32G X 4 MM Misc  Commonly known as:  BD PEN NEEDLE NANO U/F  Use as directed     LANTUS SOLOSTAR 100 UNIT/ML Solostar Pen   Generic drug:  Insulin Glargine  Inject 8-12 Units into the skin at bedtime. Inject 12 units at bedtime if AM glucose was > 90.  Inject 8 units at bedtime if AM glucose was <= 90.     methocarbamol 500 MG tablet  Commonly known as:  ROBAXIN  Take 1 tablet (500 mg total) by mouth every 6 (six) hours as needed for muscle spasms.     ofloxacin 0.3 % ophthalmic solution  Commonly known as:  OCUFLOX  Place 1 drop into the left eye 2 (two) times daily.     oxyCODONE-acetaminophen 5-325 MG per tablet  Commonly known as:  PERCOCET/ROXICET  Take 1-2 tablets by mouth every 4 (four) hours as needed for moderate pain.     PATANASE 0.6 % Soln  Generic drug:  Olopatadine HCl  Place 1 puff into both nostrils 3 (three) times daily.     potassium chloride SA 20 MEQ tablet  Commonly known as:  K-DUR,KLOR-CON  Take 1 tablet (20 mEq total) by mouth daily.     pregabalin 50 MG capsule  Commonly known as:  LYRICA  Take 50 mg by mouth 2 (two) times daily.     PROLENSA 0.07 % Soln  Generic drug:  Bromfenac Sodium  Place 1 drop into the left eye every morning.     pyridOXINE 100 MG tablet  Commonly known as:  VITAMIN B-6  Take 100 mg by mouth daily.     rosuvastatin 10 MG tablet  Commonly known as:  CRESTOR  Take 10 mg by mouth at bedtime.     TUSSIN DM PO  Take 5 mLs by mouth daily as needed (cough).     valsartan-hydrochlorothiazide 320-12.5 MG per tablet  Commonly known as:  DIOVAN-HCT  Take 1 tablet by mouth daily.     vitamin B-12 100 MCG tablet  Commonly known as:  CYANOCOBALAMIN  Take 100 mcg by mouth daily.     vitamin E 400 UNIT capsule  Take 400 Units by mouth daily.         SignedEarleen Newport 03/03/2014, 9:22 AM

## 2014-03-03 NOTE — Progress Notes (Signed)
Patient ID: [name], female   DOB: [birth year], 73 y.o.   MRN: [id number] Vital signs are stable. Physical therapy documents slow progress. Patient is ready for skilled nursing facility. She has chosen [facility]. She would like to be discharged tomorrow. She has been having constipation issues. May need either enema or Dulcolax suppository. Incision remains clean and dry. Motor function remained stable.

## 2014-03-03 NOTE — Clinical Social Work Note (Signed)
Clinical Social Worker notified by MD of discharge orders/summary. MD reported pt will discharge tomorrow. CSW will continue to follow pt for support and to facilitate pt's discharge needs.   Glendon Axe, MSW, LCSWA [phone] 03/03/2014 11:37 AM

## 2014-03-03 NOTE — Progress Notes (Signed)
Occupational Therapy Treatment Patient Details Name: [name] MRN: [id number] DOB: 05-01-1940 Today's Date: 03/03/2014    History of present illness pt presents with L3-5 PLIF.  pt reports this is her 3rd back surgery.     OT comments  Pt seen today for ADLs and functional mobility. Pt making progress with mobility and toilet transfers however continues to be limited by pain and RLE (thigh) cramping. Pt will continue to benefit from acute OT as well as SNF at d/c.    Follow Up Recommendations  SNF;Supervision/Assistance - 24 hour    Equipment Recommendations  3 in 1 bedside comode    Recommendations for Other Services      Precautions / Restrictions Precautions Precautions: Back;Fall Precaution Comments: Pt able to recall precautions but continues to require VC's to maintain Required Braces or Orthoses: Spinal Brace Spinal Brace: Lumbar corset;Applied in sitting position Restrictions Weight Bearing Restrictions: No       Mobility Bed Mobility Overal bed mobility: Needs Assistance Bed Mobility: Rolling;Sidelying to Sit Rolling: Min guard Sidelying to sit: Min assist       General bed mobility comments: (A) to elevate trunk off bed  Transfers Overall transfer level: Needs assistance Equipment used: Rolling walker (2 wheeled) Transfers: Sit to/from Stand Sit to Stand: Min guard         General transfer comment: Close min guard for safety, however pt able to stand without physical assist from bed and from toilet.        ADL Overall ADL's : Needs assistance/impaired                         Toilet Transfer: Min guard;RW (BSC over toilet) Toilet Transfer Details (indicate cue type and reason): close min guard for safety and balance, but no physical assist required.  Toileting- Water quality scientist and Hygiene: Min guard;Sit to/from stand;Cueing for sequencing Toileting - Clothing Manipulation Details (indicate cue type and reason): Pt able to  perform toilet hygiene with Min Guard for balance and back precautions     Functional mobility during ADLs: Min guard;Rolling walker General ADL Comments: Pt continues to be limited by pain and RLE (thigh) cramp.                 Cognition  Arousal/Alert: Awake/Alert Behavior During Therapy: WFL for tasks assessed/performed Overall Cognitive Status: Within Functional Limits for tasks assessed                                    Pertinent Vitals/ Pain       Pain Assessment: 0-10 Pain Score: 8  Pain Location: back Pain Descriptors / Indicators: Aching Pain Intervention(s): Monitored during session;Repositioned;Heat applied         Frequency Min 2X/week     Progress Toward Goals  OT Goals(current goals can now be found in the care plan section)  Progress towards OT goals: Progressing toward goals     Plan Discharge plan remains appropriate       End of Session Equipment Utilized During Treatment: Rolling walker;Back brace   Activity Tolerance Patient tolerated treatment well   Patient Left in chair;with call bell/phone within reach   Nurse Communication          Time: 1211-1228 OT Time Calculation (min): 17 min  Charges: OT General Charges $OT Visit: 1 Procedure OT Treatments $Self Care/Home Management : 8-22 mins  Sabra Heck,  Brett Canales 03/03/2014, 1:30 PM  Cyndie Chime, OTR/L Occupational Therapist [phone] (pager)

## 2014-03-04 DIAGNOSIS — E1140 Type 2 diabetes mellitus with diabetic neuropathy, unspecified: Secondary | ICD-10-CM | POA: Diagnosis not present

## 2014-03-04 DIAGNOSIS — M6281 Muscle weakness (generalized): Secondary | ICD-10-CM | POA: Diagnosis not present

## 2014-03-04 DIAGNOSIS — R2681 Unsteadiness on feet: Secondary | ICD-10-CM | POA: Diagnosis not present

## 2014-03-04 DIAGNOSIS — J309 Allergic rhinitis, unspecified: Secondary | ICD-10-CM | POA: Diagnosis not present

## 2014-03-04 DIAGNOSIS — Z794 Long term (current) use of insulin: Secondary | ICD-10-CM | POA: Diagnosis not present

## 2014-03-04 DIAGNOSIS — M810 Age-related osteoporosis without current pathological fracture: Secondary | ICD-10-CM | POA: Diagnosis not present

## 2014-03-04 DIAGNOSIS — D62 Acute posthemorrhagic anemia: Secondary | ICD-10-CM | POA: Diagnosis not present

## 2014-03-04 DIAGNOSIS — M549 Dorsalgia, unspecified: Secondary | ICD-10-CM | POA: Diagnosis not present

## 2014-03-04 DIAGNOSIS — M4316 Spondylolisthesis, lumbar region: Secondary | ICD-10-CM | POA: Diagnosis not present

## 2014-03-04 DIAGNOSIS — E876 Hypokalemia: Secondary | ICD-10-CM | POA: Diagnosis not present

## 2014-03-04 DIAGNOSIS — Z981 Arthrodesis status: Secondary | ICD-10-CM | POA: Diagnosis not present

## 2014-03-04 DIAGNOSIS — M47896 Other spondylosis, lumbar region: Secondary | ICD-10-CM | POA: Diagnosis not present

## 2014-03-04 DIAGNOSIS — E119 Type 2 diabetes mellitus without complications: Secondary | ICD-10-CM | POA: Diagnosis not present

## 2014-03-04 DIAGNOSIS — R279 Unspecified lack of coordination: Secondary | ICD-10-CM | POA: Diagnosis not present

## 2014-03-04 DIAGNOSIS — E785 Hyperlipidemia, unspecified: Secondary | ICD-10-CM | POA: Diagnosis not present

## 2014-03-04 DIAGNOSIS — M5416 Radiculopathy, lumbar region: Secondary | ICD-10-CM | POA: Diagnosis not present

## 2014-03-04 DIAGNOSIS — G629 Polyneuropathy, unspecified: Secondary | ICD-10-CM | POA: Diagnosis not present

## 2014-03-04 DIAGNOSIS — M4806 Spinal stenosis, lumbar region: Secondary | ICD-10-CM | POA: Diagnosis not present

## 2014-03-04 DIAGNOSIS — M4800 Spinal stenosis, site unspecified: Secondary | ICD-10-CM | POA: Diagnosis not present

## 2014-03-04 DIAGNOSIS — I10 Essential (primary) hypertension: Secondary | ICD-10-CM | POA: Diagnosis not present

## 2014-03-04 LAB — GLUCOSE, CAPILLARY: Glucose-Capillary: 126 mg/dL — ABNORMAL HIGH (ref 70–99)

## 2014-03-04 NOTE — Progress Notes (Signed)
Pt discharged at this time via stretcher transport pickup with discharge paperwork and all personal belongs brace included. Pt alert, verbal with no noted distress. IV discontinued dry dressing. Report called in to nurse, Opal Sidles at [facility] facility.

## 2014-03-04 NOTE — Progress Notes (Signed)
Clinical Social Worker facilitated patient discharge including contacting patient family and facility to confirm patient discharge plans.  Clinical information faxed to facility and family agreeable with plan.  CSW arranged ambulance transport via [facility] to [facility].  RN to call report prior to discharge.  Clinical Social Worker will sign off for now as social work intervention is no longer needed. Please consult [place] again if new need arises.  Glendon Axe, MSW, LCSWA [phone] 03/04/2014 9:51 AM

## 2014-03-07 ENCOUNTER — Encounter: Payer: Self-pay | Admitting: Adult Health

## 2014-03-07 ENCOUNTER — Non-Acute Institutional Stay (SKILLED_NURSING_FACILITY): Payer: Medicare Other | Admitting: Adult Health

## 2014-03-07 ENCOUNTER — Other Ambulatory Visit: Payer: Self-pay

## 2014-03-07 DIAGNOSIS — J309 Allergic rhinitis, unspecified: Secondary | ICD-10-CM

## 2014-03-07 DIAGNOSIS — G629 Polyneuropathy, unspecified: Secondary | ICD-10-CM | POA: Diagnosis not present

## 2014-03-07 DIAGNOSIS — D62 Acute posthemorrhagic anemia: Secondary | ICD-10-CM | POA: Diagnosis not present

## 2014-03-07 DIAGNOSIS — M4806 Spinal stenosis, lumbar region: Secondary | ICD-10-CM | POA: Diagnosis not present

## 2014-03-07 DIAGNOSIS — M810 Age-related osteoporosis without current pathological fracture: Secondary | ICD-10-CM

## 2014-03-07 DIAGNOSIS — E876 Hypokalemia: Secondary | ICD-10-CM | POA: Diagnosis not present

## 2014-03-07 DIAGNOSIS — E785 Hyperlipidemia, unspecified: Secondary | ICD-10-CM | POA: Diagnosis not present

## 2014-03-07 DIAGNOSIS — E1140 Type 2 diabetes mellitus with diabetic neuropathy, unspecified: Secondary | ICD-10-CM

## 2014-03-07 DIAGNOSIS — M5416 Radiculopathy, lumbar region: Secondary | ICD-10-CM

## 2014-03-07 DIAGNOSIS — M48061 Spinal stenosis, lumbar region without neurogenic claudication: Secondary | ICD-10-CM

## 2014-03-07 MED ORDER — HYDROCODONE-ACETAMINOPHEN 5-325 MG PO TABS: 1.0000 | ORAL_TABLET | ORAL | Status: DC | PRN

## 2014-03-07 NOTE — Telephone Encounter (Signed)
Rx faxed to [facility] @ [phone], phone number [phone]  

## 2014-03-10 ENCOUNTER — Non-Acute Institutional Stay (SKILLED_NURSING_FACILITY): Payer: Medicare Other | Admitting: Internal Medicine

## 2014-03-10 ENCOUNTER — Encounter: Payer: Self-pay | Admitting: Internal Medicine

## 2014-03-10 DIAGNOSIS — M810 Age-related osteoporosis without current pathological fracture: Secondary | ICD-10-CM

## 2014-03-10 DIAGNOSIS — E1140 Type 2 diabetes mellitus with diabetic neuropathy, unspecified: Secondary | ICD-10-CM | POA: Diagnosis not present

## 2014-03-10 DIAGNOSIS — I10 Essential (primary) hypertension: Secondary | ICD-10-CM

## 2014-03-10 DIAGNOSIS — G629 Polyneuropathy, unspecified: Secondary | ICD-10-CM | POA: Insufficient documentation

## 2014-03-10 DIAGNOSIS — M4316 Spondylolisthesis, lumbar region: Secondary | ICD-10-CM | POA: Diagnosis not present

## 2014-03-10 DIAGNOSIS — J309 Allergic rhinitis, unspecified: Secondary | ICD-10-CM | POA: Insufficient documentation

## 2014-03-10 DIAGNOSIS — E785 Hyperlipidemia, unspecified: Secondary | ICD-10-CM

## 2014-03-10 HISTORY — DX: Age-related osteoporosis without current pathological fracture: M81.0

## 2014-03-10 NOTE — Progress Notes (Signed)
Patient ID: [name], female   DOB: 02-Mar-1941, 73 y.o.   MRN: [id number]     [facility]   PCP: Patricia Nettle, MD  Code Status: full code  Allergies  Allergen Reactions  . Betadine [Povidone Iodine] Swelling    Reaction to betadine eye drops  . Adhesive [Tape] Rash  . Lipitor [Atorvastatin] Rash    Chief Complaint  Patient presents with  . New Admit To SNF     HPI:  73 y/o female pt is here for STR after hospital admission from 02/28/14-03/03/14 with lumbar spinal stenosis and spondylosis. She underwent decompression of L3 and L4 with decompression of L3-L4 and L5 nerve roots posterior lumbar interbody arthrodesis L3-4 and L4-5 posterior segmental fixation L3-L5. She is seen in her room today. Complaints of occassional muscle cramps. Pain under control with current regimen, regular bowel movement.   Review of Systems:  Constitutional: Negative for fever, chills,  malaise/fatigue and diaphoresis.  HENT: Negative for congestion Respiratory: Negative for cough, sputum production, shortness of breath and wheezing.   Cardiovascular: Negative for chest pain, palpitations, orthopnea and leg swelling.  Gastrointestinal: Negative for heartburn, nausea, vomiting, abdominal pain Genitourinary: Negative for dysuria Neurological: Negative for weakness,dizziness, tingling, focal weakness and headaches.  Psychiatric/Behavioral: Negative for depression, insomnia and memory loss. The patient is not nervous/anxious.     Past Medical History  Diagnosis Date  . Complication of anesthesia     wakes up shaking  . Diabetes mellitus   . Heart murmur     sees Dr. Montez Morita, last seen- early 2012  . Asthma     has used inhaler in past for asthmatic bronchitis, last time- early 2012  . Sleep apnea     borderline sleep apnea, states she no longer uses, early 2012- stopped using   . GERD (gastroesophageal reflux disease)     occas. use of  Prilosec  .  Neuromuscular disorder     lumbar radiculopathy, lumbago  . Fibromyalgia   . Arthritis   . Hypertension     02/2010- stress test /w PCP  . Anemia     has sickle cell trait   Past Surgical History  Procedure Laterality Date  . Back surgery      2012  . Abdominal hysterectomy    . Adb.cyst      ovarian cyst  . Eye surgery      macular degeneration treatment - injections  . Ovarian cyst surgery     Social History:   reports that she quit smoking about 14 years ago. She does not have any smokeless tobacco history on file. She reports that she drinks alcohol. She reports that she does not use illicit drugs.  Family History  Problem Relation Age of Onset  . Anesthesia problems Neg Hx   . Hypotension Neg Hx   . Malignant hyperthermia Neg Hx   . Pseudochol deficiency Neg Hx     Medications: Patient's Medications  New Prescriptions   No medications on file  Previous Medications   ALENDRONATE (FOSAMAX) 70 MG TABLET    Take 70 mg by mouth once a week. On Sundays   CALCIUM CARBONATE (TUMS EX) 750 MG CHEWABLE TABLET    Chew 1 tablet by mouth 2 (two) times daily as needed for heartburn (indigestion).   CETIRIZINE (ZYRTEC) 10 MG TABLET    Take 10 mg by mouth daily.   DEXTROMETHORPHAN-GUAIFENESIN (TUSSIN DM PO)    Take 5 mLs by mouth daily  as needed (cough).   FLUTICASONE (FLONASE) 50 MCG/ACT NASAL SPRAY    Place 1 spray into both nostrils daily.    GABAPENTIN (NEURONTIN) 100 MG CAPSULE    Take 200-300 mg by mouth 2 (two) times daily. Take 2 capsules (200 mg) every morning and 3 capsules (300 mg) at bedtime   GLIMEPIRIDE (AMARYL) 2 MG TABLET    Take 2-4 mg by mouth 2 (two) times daily. Take 1 tablet (2 mg) every morning and 2 tablets (4 mg) every night   HYDROCODONE-ACETAMINOPHEN (NORCO/VICODIN) 5-325 MG PER TABLET    Take 1 tablet by mouth every 4 (four) hours as needed for moderate pain (DO NOT EXCEED 4 GM OF TYLENOL IN 24 HOURS).   IBUPROFEN (ADVIL,MOTRIN) 800 MG TABLET    Take 800 mg  by mouth 3 (three) times daily as needed (pain).    INSULIN GLARGINE (LANTUS SOLOSTAR) 100 UNIT/ML SOLOSTAR PEN    Inject 8-12 Units into the skin at bedtime. Inject 12 units at bedtime if AM glucose was > 90.  Inject 8 units at bedtime if AM glucose was <= 90.   INSULIN LISPRO (HUMALOG KWIKPEN) 100 UNIT/ML KIWKPEN    Inject 3-8 Units into the skin 3 (three) times daily. Inject 4 units before breakfast, 6-8 units before lunch/dinner (exept for salad: reduce to 3 units)   INSULIN PEN NEEDLE (BD PEN NEEDLE NANO U/F) 32G X 4 MM MISC    Use as directed   MENTHOL, TOPICAL ANALGESIC, (BIOFREEZE ROLL-ON EX)    Apply 1 application topically daily as needed (pain).   METHOCARBAMOL (ROBAXIN) 500 MG TABLET    Take 1 tablet (500 mg total) by mouth every 6 (six) hours as needed for muscle spasms.   MULTIPLE MINERALS (CALCIUM-MAGNESIUM-ZINC) TABS    Take 1 tablet by mouth daily. Calcium 1000 mg, magnesium 400 mg, zinc 25 mg   OFLOXACIN (OCUFLOX) 0.3 % OPHTHALMIC SOLUTION    Place 1 drop into the left eye 2 (two) times daily.    OLOPATADINE HCL (PATANASE) 0.6 % SOLN    Place 1 puff into both nostrils 3 (three) times daily.   OXYCODONE-ACETAMINOPHEN (PERCOCET/ROXICET) 5-325 MG PER TABLET    Take 1-2 tablets by mouth every 4 (four) hours as needed for moderate pain.   POTASSIUM CHLORIDE SA (K-DUR,KLOR-CON) 20 MEQ TABLET    Take 1 tablet (20 mEq total) by mouth daily.   PREGABALIN (LYRICA) 50 MG CAPSULE    Take 50 mg by mouth 2 (two) times daily.   PROLENSA 0.07 % SOLN    Place 1 drop into the left eye every morning.   PYRIDOXINE (VITAMIN B-6) 100 MG TABLET    Take 100 mg by mouth daily.   ROSUVASTATIN (CRESTOR) 10 MG TABLET    Take 10 mg by mouth at bedtime.   VALSARTAN-HYDROCHLOROTHIAZIDE (DIOVAN-HCT) 320-12.5 MG PER TABLET    Take 1 tablet by mouth daily.    VITAMIN B-12 (CYANOCOBALAMIN) 100 MCG TABLET    Take 100 mcg by mouth daily.   VITAMIN E 400 UNIT CAPSULE    Take 400 Units by mouth daily.  Modified  Medications   No medications on file  Discontinued Medications   No medications on file     Physical Exam: Filed Vitals:   03/10/14 1242  BP: 122/73  Pulse: 90  Temp: 99.3 F (37.4 C)  Resp: 20  Weight: 171 lb (77.565 kg)  SpO2: 97%    General- elderly female in no acute distress Head- atraumatic, normocephalic  Eyes- PERRLA, EOMI, no pallor, no icterus, no discharge Neck- no cervical lymphadenopathy Throat- moist mucus membraneCardiovascular- normal s1,s2, no murmurs/ rubs/ gallops, dorsalis pedis Respiratory- bilateral clear to auscultation, no wheeze, no rhonchi, no crackles, no use of accessory muscles Abdomen- bowel sounds present, soft, non tender Musculoskeletal- able to move all 4 extremities, using walker Neurological- no focal deficit Skin- warm and dry, incision site healing well, glued, no signs of infection Psychiatry- alert and oriented to person, place and time, normal mood and affect   Labs reviewed: Basic Metabolic Panel:  Recent Labs  12/10/13 1108 02/25/14 1352 03/01/14 0200  NA 141 140 142  K 3.6 3.8 4.3  CL 103 100 102  CO2 26 25 27   GLUCOSE 111* 208* 184*  BUN 19 14 16   CREATININE 1.1 0.77 0.84  CALCIUM 9.3 10.0 8.8   Liver Function Tests:  Recent Labs  10/05/13 1039 03/01/14 0200  AST 20 31  ALT 14 16  ALKPHOS 46 34*  BILITOT 0.3 0.3  PROT 6.7 5.5*  ALBUMIN 4.4 3.4*   CBC:  Recent Labs  02/25/14 1352 03/01/14 0200  WBC 5.9 8.3  HGB 13.2 10.3*  HCT 39.8 31.1*  MCV 82.1 80.8  PLT 202 157    Assessment/Plan  Lumbar spondylosis S/p decompression. To f/u with orthopedics. Will have patient work with PT/OT as tolerated to regain strength and restore function.  Fall precautions are in place. Continue norco and robaxin  Osteoporosis Continue weekly fosamax  Dm with neuropathy Continue amaryl, lantus, SSI lispro. monitor cbg, continue gabapentin and lyrica. Also on ARB and statin  HTN bp stable, continue losartan-hctz  and kcl supplement. Monitor bmp  HLD Continue crestor   Family/ staff Communication: reviewed care plan with patient and nursing supervisor   Goals of care: short term rehabilitation    Blanchie Serve, MD  [facility] Adult Medicine [phone] (Monday-Friday 8 am - 5 pm) [phone] (afterhours)

## 2014-03-10 NOTE — Progress Notes (Signed)
Patient ID: [name], female   DOB: [birth year], 73 y.o.   MRN: [id number]   03/07/14  Facility:  Nursing Home Location:  [place] Room Number: 706-P LEVEL OF CARE:  SNF (31)   Chief Complaint  Patient presents with  . Hospitalization Follow-up    Spinal stenosis of lumbar regio with radiculopathy, Osteoporosis, Allergic rhinitis, Neuropathy, Diabetes Mellitus, Hypokalemia, Hyperlipidemia and Anemia    HISTORY OF PRESENT ILLNESS:  This is a 73 year old female who has been admitted to [facility] on 03/04/14 from [facility] with Spinal stenoses of the lumbar region with radiculopathy S/P decompression of L3 and L4 with decompression of L3-L4 and L5 nerve roots posterior lumbar interbody arthrodeses L3-4 and L4-5 posterior segment fixation L3-L5.  She has been admitted for a short-term rehabilitation.  REASSESSMENT OF ONGOING PROBLEMS:  PERIPHERAL NEUROPATHY: The peripheral neuropathy is stable. The patient denies pain in the feet, tingling, and numbness. No complications noted from the medication presently being used.  DM:pt's DM remains stable.  Pt denies polyuria, polydipsia, polyphagia, changes in vision or hypoglycemic episodes.  No complications noted from the medication presently being used.   8/15 hemoglobin A1c is: 7.1  ALLERGIC RHINITIS: Allergic rhinitis remains stable.  Patient denies ongoing symptoms such as runny nose sneezing or tearing. No complications reported from the current medication(s) being used.  OSTEOPOROSIS: The patient's osteoporosis remains stable. The patient denies recent worsening of pain and the range of motion remains stable. There has not been any evidence of fractures. No complications noted from the medications presently being used.   PAST MEDICAL HISTORY:  Past Medical History  Diagnosis Date  . Complication of anesthesia     wakes up shaking  . Diabetes mellitus   . Heart murmur     sees Dr.  Montez Morita, last seen- early 2012  . Asthma     has used inhaler in past for asthmatic bronchitis, last time- early 2012  . Sleep apnea     borderline sleep apnea, states she no longer uses, early 2012- stopped using   . GERD (gastroesophageal reflux disease)     occas. use of  Prilosec  . Neuromuscular disorder     lumbar radiculopathy, lumbago  . Fibromyalgia   . Arthritis   . Hypertension     02/2010- stress test /w PCP  . Anemia     has sickle cell trait    CURRENT MEDICATIONS: Reviewed per MAR/see medication list  Allergies  Allergen Reactions  . Betadine [Povidone Iodine] Swelling    Reaction to betadine eye drops  . Adhesive [Tape] Rash  . Lipitor [Atorvastatin] Rash     REVIEW OF SYSTEMS:  GENERAL: no change in appetite, no fatigue, no weight changes, no fever, chills or weakness RESPIRATORY: no cough, SOB, DOE, wheezing, hemoptysis CARDIAC: no chest pain, edema or palpitations GI: no abdominal pain, diarrhea, constipation, heart burn, nausea or vomiting  PHYSICAL EXAMINATION  GENERAL: no acute distress, normal body habitus SKIN:  Lower back midline incision is dry, no erythema EYES: conjunctivae normal, sclerae normal, normal eye lids NECK: supple, trachea midline, no neck masses, no thyroid tenderness, no thyromegaly LYMPHATICS: no LAN in the neck, no supraclavicular LAN RESPIRATORY: breathing is even & unlabored, BS CTAB CARDIAC: RRR, no murmur,no extra heart sounds, no edema GI: abdomen soft, normal BS, no masses, no tenderness, no hepatomegaly, no splenomegaly EXTREMITIES: able to move all 4 extremities PSYCHIATRIC: the patient is alert & oriented to person,  affect & behavior appropriate  LABS/RADIOLOGY: Labs reviewed: Basic Metabolic Panel:  Recent Labs  12/10/13 1108 02/25/14 1352 03/01/14 0200  NA 141 140 142  K 3.6 3.8 4.3  CL 103 100 102  CO2 26 25 27   GLUCOSE 111* 208* 184*  BUN 19 14 16   CREATININE 1.1 0.77 0.84  CALCIUM 9.3 10.0 8.8    Liver Function Tests:  Recent Labs  10/05/13 1039 03/01/14 0200  AST 20 31  ALT 14 16  ALKPHOS 46 34*  BILITOT 0.3 0.3  PROT 6.7 5.5*  ALBUMIN 4.4 3.4*   CBC:  Recent Labs  02/25/14 1352 03/01/14 0200  WBC 5.9 8.3  HGB 13.2 10.3*  HCT 39.8 31.1*  MCV 82.1 80.8  PLT 202 157   Lipid Panel:  Recent Labs  10/05/13 1039  HDL 48.50   BNP: Invalid input(s): POCBNP CBG:  Recent Labs  03/03/14 1659 03/03/14 2054 03/04/14 0656  GLUCAP 179* 207* 126*    Dg Lumbar Spine 2-3 Views  02/28/2014   CLINICAL DATA:  L3-L5 posterior fusion.  EXAM: LUMBAR SPINE - 2-3 VIEW  COMPARISON:  02/28/2014.  FINDINGS: Intraoperative spot images obtained reveal L3 through L5 posterior and interbody fusion with good anatomic alignment. No left L3 screw noted. Lumbar vertebra are numbered with the lowest segmented appearing lumbar vertebra as L5.  IMPRESSION: L3 through L5 posterior interbody fusion as described above.   Electronically Signed   By: Marcello Moores  Register   On: 02/28/2014 17:07   Ct Lumbar Spine Wo Contrast  02/18/2014   CLINICAL DATA:  Spinal stenosis.  Lumbar fusion  EXAM: CT LUMBAR SPINE WITHOUT CONTRAST  TECHNIQUE: Multidetector CT imaging of the lumbar spine was performed without intravenous contrast administration. Multiplanar CT image reconstructions were also generated.  COMPARISON:  Lumbar MRI 06/14/2013  FINDINGS: Pedicle screw and interbody fusion at L5-S1. Mild anterior slip L4-5 and L5-S1 unchanged from the MRI. Negative for fracture. No mass or acute bony abnormality.  T12-L1:  Central calcified disc protrusion without stenosis  L1-2:  Negative.  L2-3:  Diffuse disc bulging with mild facet degeneration.  L3-4: Diffuse disc bulging of a moderate degree. Moderately severe facet hypertrophy bilaterally with moderate to severe spinal stenosis. Left foraminal encroachment due to disc protrusion.  L4-5: Grade 1 anterior slip. Advanced facet degeneration. Disc bulging. Mild to  moderate spinal stenosis. Mild foraminal stenosis bilaterally  L5-S1: Bilateral pedicle screw and interbody fusion. Solid interbody fusion is noted.  IMPRESSION: Central calcified disc protrusion T12-L1.  Moderate to severe spinal stenosis at L3-4. Left foraminal encroachment due to disc protrusion.  Grade 1 anterior slip L4-5. Advanced facet degeneration with mild to moderate spinal stenosis  Solid fusion L5-S1.   Electronically Signed   By: Franchot Gallo M.D.   On: 02/18/2014 18:03   Dg C-arm 1-60 Min  02/28/2014   CLINICAL DATA:  L3 through L5 posterior fusion.  EXAM: DG C-ARM 61-120 MIN  TECHNIQUE: Intraoperative spot studies obtained.  CONTRAST:  None.  FLUOROSCOPY TIME:  0 min 31 seconds.  COMPARISON:  None.  FINDINGS: L3-L5 posterior and interbody fusion with good anatomic alignment. No left L3 screw noted. No acute bony abnormality. Lumbar vertebra are numbered with the lowest segmented appearing lumbar vertebra as L5.  IMPRESSION: L3 through L5 posterior and interbody fusion as described above.   Electronically Signed   By: Marcello Moores  Register   On: 02/28/2014 17:08    ASSESSMENT/PLAN:  Spinal stenoses of the lumbar region with radiculopathy  -  for rehabilitation Osteoporosis - stable; continue Fosamax 70 mg by mouth every weekly (Sundays) Allergic rhinitis - stable; continue Zyrtec 10 mg by mouth daily and Flonase 50 g/ACT 1 spray to each nostril daily Neuropathy - stable; continue Neurontin 200 mg by mouth every morning and 300 mg by mouth daily at bedtime and Lyrica 50 mg by mouth twice a day Diabetes mellitus with neurological complication - well controlled; continue Amaryl 2 mg by mouth every morning and 4 mg by mouth every night, Humalog 4 units before breakfast and 6 units before lunch and dinner and Lantus 12 units at bedtime if a.m. Glucose > 90 and 8 units at at bedtime if glucose was <=90 Hypokalemia - K=4.3 ; continue K Dur 20 meq by mouth daily Hyperlipidemia - continue Crestor 10  mg by mouth daily at bedtime Anemia, acute blood loss - stable; hemoglobin 10.3    Spent 50 minutes in patient care.     [place], NP [employer] [phone]

## 2014-03-11 ENCOUNTER — Non-Acute Institutional Stay (SKILLED_NURSING_FACILITY): Payer: Medicare Other | Admitting: Adult Health

## 2014-03-11 ENCOUNTER — Encounter: Payer: Self-pay | Admitting: Adult Health

## 2014-03-11 DIAGNOSIS — E876 Hypokalemia: Secondary | ICD-10-CM | POA: Diagnosis not present

## 2014-03-11 DIAGNOSIS — M48061 Spinal stenosis, lumbar region without neurogenic claudication: Secondary | ICD-10-CM

## 2014-03-11 DIAGNOSIS — D62 Acute posthemorrhagic anemia: Secondary | ICD-10-CM | POA: Diagnosis not present

## 2014-03-11 DIAGNOSIS — M4806 Spinal stenosis, lumbar region: Secondary | ICD-10-CM | POA: Diagnosis not present

## 2014-03-11 DIAGNOSIS — G629 Polyneuropathy, unspecified: Secondary | ICD-10-CM

## 2014-03-11 DIAGNOSIS — M5416 Radiculopathy, lumbar region: Secondary | ICD-10-CM

## 2014-03-11 DIAGNOSIS — M810 Age-related osteoporosis without current pathological fracture: Secondary | ICD-10-CM

## 2014-03-11 DIAGNOSIS — E1140 Type 2 diabetes mellitus with diabetic neuropathy, unspecified: Secondary | ICD-10-CM

## 2014-03-11 DIAGNOSIS — J309 Allergic rhinitis, unspecified: Secondary | ICD-10-CM

## 2014-03-11 DIAGNOSIS — E785 Hyperlipidemia, unspecified: Secondary | ICD-10-CM

## 2014-03-14 DIAGNOSIS — E119 Type 2 diabetes mellitus without complications: Secondary | ICD-10-CM | POA: Diagnosis not present

## 2014-03-14 DIAGNOSIS — Z4889 Encounter for other specified surgical aftercare: Secondary | ICD-10-CM | POA: Diagnosis not present

## 2014-03-14 DIAGNOSIS — Z794 Long term (current) use of insulin: Secondary | ICD-10-CM | POA: Diagnosis not present

## 2014-03-14 DIAGNOSIS — M4726 Other spondylosis with radiculopathy, lumbar region: Secondary | ICD-10-CM | POA: Diagnosis not present

## 2014-03-14 DIAGNOSIS — M4806 Spinal stenosis, lumbar region: Secondary | ICD-10-CM | POA: Diagnosis not present

## 2014-03-14 DIAGNOSIS — M797 Fibromyalgia: Secondary | ICD-10-CM | POA: Diagnosis not present

## 2014-03-15 DIAGNOSIS — E119 Type 2 diabetes mellitus without complications: Secondary | ICD-10-CM | POA: Diagnosis not present

## 2014-03-15 DIAGNOSIS — Z4889 Encounter for other specified surgical aftercare: Secondary | ICD-10-CM | POA: Diagnosis not present

## 2014-03-15 DIAGNOSIS — M4806 Spinal stenosis, lumbar region: Secondary | ICD-10-CM | POA: Diagnosis not present

## 2014-03-15 DIAGNOSIS — M797 Fibromyalgia: Secondary | ICD-10-CM | POA: Diagnosis not present

## 2014-03-15 DIAGNOSIS — Z794 Long term (current) use of insulin: Secondary | ICD-10-CM | POA: Diagnosis not present

## 2014-03-15 DIAGNOSIS — M4726 Other spondylosis with radiculopathy, lumbar region: Secondary | ICD-10-CM | POA: Diagnosis not present

## 2014-03-21 ENCOUNTER — Other Ambulatory Visit: Payer: Self-pay | Admitting: *Deleted

## 2014-03-21 MED ORDER — OXYCODONE-ACETAMINOPHEN 5-325 MG PO TABS: 1.0000 | ORAL_TABLET | ORAL | Status: DC | PRN

## 2014-03-21 NOTE — Telephone Encounter (Signed)
[facility] sent message stating that they need a Rx faxed and mailed to them for an emergency refill, needs to be signed by Bard Herbert. I called Bard Herbert and she stated to print and she will sign it in the morning.

## 2014-03-23 DIAGNOSIS — M4806 Spinal stenosis, lumbar region: Secondary | ICD-10-CM | POA: Diagnosis not present

## 2014-03-23 DIAGNOSIS — Z6828 Body mass index (BMI) 28.0-28.9, adult: Secondary | ICD-10-CM | POA: Diagnosis not present

## 2014-03-24 DIAGNOSIS — H3532 Exudative age-related macular degeneration: Secondary | ICD-10-CM | POA: Diagnosis not present

## 2014-03-24 DIAGNOSIS — H35051 Retinal neovascularization, unspecified, right eye: Secondary | ICD-10-CM | POA: Diagnosis not present

## 2014-03-25 DIAGNOSIS — M797 Fibromyalgia: Secondary | ICD-10-CM | POA: Diagnosis not present

## 2014-03-25 DIAGNOSIS — Z4889 Encounter for other specified surgical aftercare: Secondary | ICD-10-CM | POA: Diagnosis not present

## 2014-03-25 DIAGNOSIS — M4806 Spinal stenosis, lumbar region: Secondary | ICD-10-CM | POA: Diagnosis not present

## 2014-03-25 DIAGNOSIS — E119 Type 2 diabetes mellitus without complications: Secondary | ICD-10-CM | POA: Diagnosis not present

## 2014-03-25 DIAGNOSIS — Z794 Long term (current) use of insulin: Secondary | ICD-10-CM | POA: Diagnosis not present

## 2014-03-25 DIAGNOSIS — M4726 Other spondylosis with radiculopathy, lumbar region: Secondary | ICD-10-CM | POA: Diagnosis not present

## 2014-03-30 DIAGNOSIS — M4806 Spinal stenosis, lumbar region: Secondary | ICD-10-CM | POA: Diagnosis not present

## 2014-03-30 DIAGNOSIS — M797 Fibromyalgia: Secondary | ICD-10-CM | POA: Diagnosis not present

## 2014-03-30 DIAGNOSIS — Z794 Long term (current) use of insulin: Secondary | ICD-10-CM | POA: Diagnosis not present

## 2014-03-30 DIAGNOSIS — Z4889 Encounter for other specified surgical aftercare: Secondary | ICD-10-CM | POA: Diagnosis not present

## 2014-03-30 DIAGNOSIS — E119 Type 2 diabetes mellitus without complications: Secondary | ICD-10-CM | POA: Diagnosis not present

## 2014-03-30 DIAGNOSIS — M4726 Other spondylosis with radiculopathy, lumbar region: Secondary | ICD-10-CM | POA: Diagnosis not present

## 2014-04-13 ENCOUNTER — Other Ambulatory Visit: Payer: Self-pay | Admitting: Endocrinology

## 2014-05-02 ENCOUNTER — Other Ambulatory Visit: Payer: Self-pay | Admitting: *Deleted

## 2014-05-02 ENCOUNTER — Ambulatory Visit: Payer: Medicare Other | Admitting: Endocrinology

## 2014-05-02 ENCOUNTER — Encounter: Payer: Self-pay | Admitting: Endocrinology

## 2014-05-02 ENCOUNTER — Ambulatory Visit (INDEPENDENT_AMBULATORY_CARE_PROVIDER_SITE_OTHER): Payer: Medicare HMO | Admitting: Endocrinology

## 2014-05-02 VITALS — BP 145/82 | HR 86 | Temp 98.2°F | Resp 14 | Ht 66.0 in | Wt 166.8 lb

## 2014-05-02 DIAGNOSIS — E1140 Type 2 diabetes mellitus with diabetic neuropathy, unspecified: Secondary | ICD-10-CM

## 2014-05-02 DIAGNOSIS — I10 Essential (primary) hypertension: Secondary | ICD-10-CM

## 2014-05-02 DIAGNOSIS — IMO0002 Reserved for concepts with insufficient information to code with codable children: Secondary | ICD-10-CM

## 2014-05-02 DIAGNOSIS — E785 Hyperlipidemia, unspecified: Secondary | ICD-10-CM

## 2014-05-02 DIAGNOSIS — E1165 Type 2 diabetes mellitus with hyperglycemia: Secondary | ICD-10-CM

## 2014-05-02 LAB — COMPREHENSIVE METABOLIC PANEL
ALT: 15 U/L (ref 0–35)
AST: 22 U/L (ref 0–37)
Albumin: 4.3 g/dL (ref 3.5–5.2)
Alkaline Phosphatase: 68 U/L (ref 39–117)
BUN: 16 mg/dL (ref 6–23)
CALCIUM: 10 mg/dL (ref 8.4–10.5)
CO2: 32 meq/L (ref 19–32)
Chloride: 101 mEq/L (ref 96–112)
Creatinine, Ser: 1.1 mg/dL (ref 0.4–1.2)
GFR: 65.82 mL/min (ref >60.00)
Glucose, Bld: 46 mg/dL — CL (ref 70–99)
POTASSIUM: 3.2 meq/L — AB (ref 3.5–5.1)
SODIUM: 142 meq/L (ref 135–145)
Total Bilirubin: 0.5 mg/dL (ref 0.2–1.2)
Total Protein: 7.2 g/dL (ref 6.0–8.3)

## 2014-05-02 LAB — LIPID PANEL
Cholesterol: 215 mg/dL — ABNORMAL HIGH (ref 0–200)
HDL: 49 mg/dL (ref >39.00)
LDL Cholesterol: 133 mg/dL — ABNORMAL HIGH (ref 0–99)
NonHDL: 166
Total CHOL/HDL Ratio: 4
Triglycerides: 166 mg/dL — ABNORMAL HIGH (ref 0.0–149.0)
VLDL: 33.2 mg/dL (ref 0.0–40.0)

## 2014-05-02 LAB — HEMOGLOBIN A1C: Hgb A1c MFr Bld: 7.2 % — ABNORMAL HIGH (ref 4.6–6.5)

## 2014-05-02 MED ORDER — PRAVASTATIN SODIUM 40 MG PO TABS: 40.0000 mg | ORAL_TABLET | Freq: Every day | ORAL | Status: DC

## 2014-05-02 MED ORDER — GABAPENTIN 300 MG PO CAPS: 300.0000 mg | ORAL_CAPSULE | Freq: Three times a day (TID) | ORAL | Status: DC

## 2014-05-02 NOTE — Patient Instructions (Addendum)
Stop Lyrica  Gabapentin 100mg  in am and 300 mg at bedtime  Please check blood sugars at least half the time about 2 hours after any meal and 3-4 times per week on waking up. Please bring blood sugar monitor to each visit  Take 12 Lantus unless waking up with sugars <90 or > 140

## 2014-05-02 NOTE — Progress Notes (Signed)
Patient ID: [name], female   DOB: 06/20/1940, 74 y.o.   MRN: [id number]    Reason for Appointment: Diabetes follow-up   History of Present Illness     Diagnosis: Type 2 diabetes mellitus, date of diagnosis: 1998.   Prior history: She had previously been treated with Byetta, Glumetza, Amaryl, Victoza and  Onglyza However because of inadequate control and intolerance to drugs she was finally given pre-meal insulin along with Amaryl, Glumetza eventually stopped because of side effects and also Lantus added  Recent history:  The insulin regimen is: premeal rapid analog 5-10 in am,   Lantus insulin 12 units daily hs.   The patient has not been seen in follow-up since 11/2013 No recent A1c available She has been erratic with her diet; she thinks her blood sugars tend to go up when she goes off her diet with eating more carbohydrate like  chips.  Also she has not been able to do much physical activity because of her back surgery and is still waiting for surgeons clearance to exercise Not clear why she is taking much more insulin at mealtimes compared to previously especially if she is eating larger meals are more carbohydrates. She has gone up 2 units on her Lantus and her fasting blood sugars are generally controlled with sporadic high readings She is not taking Invokana, she found this expensive previously and she is concerned about side effects also Also taking Amaryl Had previously tried Victoza which caused nausea   Monitors blood glucose: 1.1 times a day, somewhat erratic when she checks her blood sugars and glucose readings are fairly good the last 3 days  Glucometer: One Touch.   PRE-MEAL Breakfast Lunch  2-4 PM  Bedtime Overall  Glucose range:  81-204   92-171   98-226   94-242    median:  115     162  129    Hypoglycemia: None recently  Meals: 3 meals per day at 10 am. 2 pm and 6-8 pm but inconsistent schedule.  Will sometimes get protein at breakfast. Her snacks may be  popcorn or chips   Physical activity: exercise:  none Certified Diabetes Educator visit: Most recent: 7/13.  Dietician visit: Most recent: 3/13.   Wt Readings from Last 3 Encounters:  05/02/14 166 lb 12.8 oz (75.66 kg)  03/11/14 171 lb 12.8 oz (77.928 kg)  03/10/14 171 lb (77.565 kg)   The microalbumin has been tested, and the result is normal Complications: Neuropathy  LABS:  Lab Results  Component Value Date   HGBA1C 7.1* 12/10/2013   HGBA1C 7.4* 10/05/2013   HGBA1C 6.5* 07/05/2013   Lab Results  Component Value Date   MICROALBUR 3.9* 10/05/2013   LDLCALC 133* 10/05/2013   CREATININE 0.84 03/01/2014        Medication List       This list is accurate as of: 05/02/14 10:22 AM.  Always use your most recent med list.               alendronate 70 MG tablet  Commonly known as:  FOSAMAX  Take 70 mg by mouth once a week. On Sundays     BIOFREEZE ROLL-ON EX  Apply 1 application topically daily as needed (pain).     calcium carbonate 750 MG chewable tablet  Commonly known as:  TUMS EX  Chew 1 tablet by mouth 2 (two) times daily as needed for heartburn (indigestion).     Calcium-Magnesium-Zinc Tabs  Take 1 tablet by mouth  daily. Calcium 1000 mg, magnesium 400 mg, zinc 25 mg     cetirizine 10 MG tablet  Commonly known as:  ZYRTEC  Take 10 mg by mouth daily.     fluticasone 50 MCG/ACT nasal spray  Commonly known as:  FLONASE  Place 1 spray into both nostrils daily.     gabapentin 100 MG capsule  Commonly known as:  NEURONTIN  Take 200-300 mg by mouth 2 (two) times daily. Take 2 capsules (200 mg) every morning and 3 capsules (300 mg) at bedtime     glimepiride 2 MG tablet  Commonly known as:  AMARYL  Take 2-4 mg by mouth 2 (two) times daily. Take 1 tablet (2 mg) every morning and 2 tablets (4 mg) every night     HUMALOG KWIKPEN 100 UNIT/ML KiwkPen  Generic drug:  insulin lispro  Inject 3-8 Units into the skin 3 (three) times daily. Inject 4 units before  breakfast, 6-8 units before lunch/dinner (exept for salad: reduce to 3 units)     HUMALOG KWIKPEN 100 UNIT/ML KiwkPen  Generic drug:  insulin lispro  INJECT 6 UNITS UNDER THE SKIN BEFORE BREAKFAST, 10 UNITS BEFORE LUNCH AND 9 UNITS BEFORE SUPPER     HYDROcodone-acetaminophen 5-325 MG per tablet  Commonly known as:  NORCO/VICODIN  Take 1 tablet by mouth every 4 (four) hours as needed for moderate pain (DO NOT EXCEED 4 GM OF TYLENOL IN 24 HOURS).     ibuprofen 800 MG tablet  Commonly known as:  ADVIL,MOTRIN  Take 800 mg by mouth 3 (three) times daily as needed (pain).     Insulin Pen Needle 32G X 4 MM Misc  Commonly known as:  BD PEN NEEDLE NANO U/F  Use as directed     LANTUS SOLOSTAR 100 UNIT/ML Solostar Pen  Generic drug:  Insulin Glargine  Inject 8-12 Units into the skin at bedtime. Inject 12 units at bedtime if AM glucose was > 90.  Inject 8 units at bedtime if AM glucose was <= 90.     methocarbamol 500 MG tablet  Commonly known as:  ROBAXIN  Take 1 tablet (500 mg total) by mouth every 6 (six) hours as needed for muscle spasms.     ofloxacin 0.3 % ophthalmic solution  Commonly known as:  OCUFLOX  Place 1 drop into the left eye 2 (two) times daily.     oxyCODONE-acetaminophen 5-325 MG per tablet  Commonly known as:  PERCOCET/ROXICET  Take 1-2 tablets by mouth every 4 (four) hours as needed for moderate pain.     PATANASE 0.6 % Soln  Generic drug:  Olopatadine HCl  Place 1 puff into both nostrils 3 (three) times daily.     potassium chloride SA 20 MEQ tablet  Commonly known as:  K-DUR,KLOR-CON  Take 1 tablet (20 mEq total) by mouth daily.     pregabalin 50 MG capsule  Commonly known as:  LYRICA  Take 50 mg by mouth 2 (two) times daily.     PROLENSA 0.07 % Soln  Generic drug:  Bromfenac Sodium  Place 1 drop into the left eye every morning.     pyridOXINE 100 MG tablet  Commonly known as:  VITAMIN B-6  Take 100 mg by mouth daily.     rosuvastatin 10 MG tablet   Commonly known as:  CRESTOR  Take 10 mg by mouth at bedtime.     TUSSIN DM PO  Take 5 mLs by mouth daily as needed (cough).  valsartan-hydrochlorothiazide 320-12.5 MG per tablet  Commonly known as:  DIOVAN-HCT  Take 1 tablet by mouth daily.     vitamin B-12 100 MCG tablet  Commonly known as:  CYANOCOBALAMIN  Take 100 mcg by mouth daily.     vitamin E 400 UNIT capsule  Take 400 Units by mouth daily.        Allergies:  Allergies  Allergen Reactions  . Betadine [Povidone Iodine] Swelling    Reaction to betadine eye drops  . Adhesive [Tape] Rash  . Lipitor [Atorvastatin] Rash    Past Medical History  Diagnosis Date  . Complication of anesthesia     wakes up shaking  . Diabetes mellitus   . Heart murmur     sees Dr. Montez Morita, last seen- early 2012  . Asthma     has used inhaler in past for asthmatic bronchitis, last time- early 2012  . Sleep apnea     borderline sleep apnea, states she no longer uses, early 2012- stopped using   . GERD (gastroesophageal reflux disease)     occas. use of  Prilosec  . Neuromuscular disorder     lumbar radiculopathy, lumbago  . Fibromyalgia   . Arthritis   . Hypertension     02/2010- stress test /w PCP  . Anemia     has sickle cell trait    Past Surgical History  Procedure Laterality Date  . Back surgery      2012  . Abdominal hysterectomy    . Adb.cyst      ovarian cyst  . Eye surgery      macular degeneration treatment - injections  . Ovarian cyst surgery      Family History  Problem Relation Age of Onset  . Anesthesia problems Neg Hx   . Hypotension Neg Hx   . Malignant hyperthermia Neg Hx   . Pseudochol deficiency Neg Hx     Social History:  reports that she quit smoking about 15 years ago. She does not have any smokeless tobacco history on file. She reports that she drinks alcohol. She reports that she does not use illicit drugs.  ROS   She is complaining today about her feet and legs tingling and also has  some pain going down her legs. She has more trouble at night. She was previously on Lyrica with some relief but another physician has given her gabapentin 100 mg recently She thinks that Lyrica may cause weight gain and is concerned about cost also  Diabetic foot exam done in 1/16  Hyperlipidemia:   The lipid abnormality consists of elevated LDL , high triglyceride and Is being treated by PCP with Crestor However she is not taking this because of cost and her lipids have been inconsistently controlled She does not think that she had tolerated Lipitor previously  Previously had been on Lescol  Lab Results  Component Value Date   CHOL 215* 10/05/2013   HDL 48.50 10/05/2013   LDLCALC 133* 10/05/2013   LDLDIRECT 153.2 10/27/2012   TRIG 166.0* 10/05/2013   CHOLHDL 4 10/05/2013    HYPERTENSION:  Has been present for several years. this is treated by PCP  She has monitored at home periodically  Currently only on Diovan HCT      Examination:   BP 145/82 mmHg  Pulse 86  Temp(Src) 98.2 F (36.8 C)  Resp 14  Ht 5\' 6"  (1.676 m)  Wt 166 lb 12.8 oz (75.66 kg)  BMI 26.94 kg/m2  SpO2 94%  Body mass index is 26.94 kg/(m^2).   No ankle edema  Diabetic foot exam shows normal monofilament sensation in the toes and plantar surfaces, no skin lesions or ulcers on the feet and normal pedal pulses   Assesment/Plan:   1. Diabetes type 2 with BMI of 27  The patient's diabetes control appears to be inconsistent with periodic high readings after meals most likely related to her not watching diet She is only on basal bolus insulin regimen now along with Amaryl Although she did have some benefit with Invokana she does not want to take this partly because of cost Also unable to take metformin and Victoza because of GI side effects As discussed in history of present illness she has not started exercises yet and she can do better with her diet Discussed with the patient that she is getting some  neuropathic symptoms from diabetes and needs to be more compliant  2. Hypertension: Blood pressure is fairly well controlled, followed by her cardiologist  3. Hyperlipidemia: Her previous LDL was high when she was irregular with her Crestor and she wants a generic medication  4.  Peripheral neuropathy with sensory symptoms: Advised her to increase her gabapentin to 300 mg at least at night and not refill Lyrica  Recommendations made to the patient today:   Moderate amounts of carbohydrates and avoid high-fat foods and snacks  Check blood sugars more consistently at various times including after supper  Start exercise when able to  Continue adjusting mealtime dose based on carbohydrate and caloric intake  May consider Invokana again if she is having difficulties with weight gain  Start pravastatin for hypercholesterolemia  Counseling time over 50% of today's 25 minute visit  Quasim Doyon 05/02/2014, 10:22 AM

## 2014-05-05 DIAGNOSIS — Z6830 Body mass index (BMI) 30.0-30.9, adult: Secondary | ICD-10-CM | POA: Diagnosis not present

## 2014-05-05 DIAGNOSIS — M4806 Spinal stenosis, lumbar region: Secondary | ICD-10-CM | POA: Diagnosis not present

## 2014-05-25 ENCOUNTER — Other Ambulatory Visit: Payer: Self-pay | Admitting: Endocrinology

## 2014-05-30 ENCOUNTER — Encounter: Payer: Self-pay | Admitting: Endocrinology

## 2014-05-30 ENCOUNTER — Ambulatory Visit (INDEPENDENT_AMBULATORY_CARE_PROVIDER_SITE_OTHER): Payer: Medicare Other | Admitting: Endocrinology

## 2014-05-30 VITALS — BP 132/84 | HR 81 | Temp 98.1°F | Resp 14 | Ht 66.0 in | Wt 170.2 lb

## 2014-05-30 DIAGNOSIS — E1140 Type 2 diabetes mellitus with diabetic neuropathy, unspecified: Secondary | ICD-10-CM | POA: Diagnosis not present

## 2014-05-30 DIAGNOSIS — I10 Essential (primary) hypertension: Secondary | ICD-10-CM

## 2014-05-30 DIAGNOSIS — E1165 Type 2 diabetes mellitus with hyperglycemia: Secondary | ICD-10-CM

## 2014-05-30 DIAGNOSIS — IMO0002 Reserved for concepts with insufficient information to code with codable children: Secondary | ICD-10-CM

## 2014-05-30 DIAGNOSIS — E785 Hyperlipidemia, unspecified: Secondary | ICD-10-CM | POA: Diagnosis not present

## 2014-05-30 LAB — URINALYSIS, ROUTINE W REFLEX MICROSCOPIC
Bilirubin Urine: NEGATIVE
Hgb urine dipstick: NEGATIVE
Ketones, ur: NEGATIVE
Leukocytes, UA: NEGATIVE
NITRITE: NEGATIVE
RBC / HPF: NONE SEEN (ref 0–?)
SPECIFIC GRAVITY, URINE: 1.015 (ref 1.000–1.030)
TOTAL PROTEIN, URINE-UPE24: NEGATIVE
URINE GLUCOSE: 250 — AB
Urobilinogen, UA: 0.2 (ref 0.0–1.0)
pH: 6 (ref 5.0–8.0)

## 2014-05-30 NOTE — Progress Notes (Signed)
Patient ID: [name], female   DOB: 1940-09-28, 74 y.o.   MRN: [id number]    Reason for Appointment: Diabetes follow-up   History of Present Illness     Diagnosis: Type 2 diabetes mellitus, date of diagnosis: 1998.   Prior history: She had previously been treated with Byetta, Glumetza, Amaryl, Victoza and  Onglyza However because of inadequate control and intolerance to drugs she was finally given pre-meal insulin along with Amaryl, Glumetza eventually stopped because of side effects and also Lantus added  Recent history:  The insulin regimen is: Humalog 6-10 ac   Lantus insulin 12 units daily hs.   Her A1c has been fairly consistent between 7.1-7.4 She is currently taking low doses of basal bolus insulin regimen along with Amaryl On her visit in 1/16 she was having periodic high readings related to inconsistent diet and she was advised to be consistent with carbohydrate intake and moderate on portions. She has however not checked much after meals to see what her postprandial readings are and blood sugars are somewhat variable. She has a fairly good pattern of blood sugars in the mornings fasting with her current regimen of Lantus. However has gained weight Still not able to start exercising. Today her blood sugar was 190 but she check this right after finishing her breakfast.  Also does not always eat [place] proteins with breakfast  She thinks that sometimes her blood sugars may be higher when she forgets to take her Amaryl on time She previously tried [place], she found this expensive and not clear if she had much benefit, did not lose weight with this. Also taking Amaryl at supper Had previously tried Victoza which caused nausea Hypoglycemia: None recently  Monitors blood glucose: 1.5 times a day, checking blood sugars mostly in the mornings and mid day Glucometer: One Touch.   PRE-MEAL Breakfast  PCB  Dinner Bedtime Overall  Glucose range:  86-150   134-210   66-166  253    Mean/median:  104     134   Meals: 2-3 meals per day at 10 am. 2 pm and 6-8 pm but inconsistent schedule.  Will sometimes get protein at breakfast. Her snacks may be popcorn or chips   Physical activity: exercise:  none Certified Diabetes Educator visit: Most recent: 7/13.  Dietician visit: Most recent: 3/13.   Wt Readings from Last 3 Encounters:  05/30/14 170 lb 3.2 oz (77.202 kg)  05/02/14 166 lb 12.8 oz (75.66 kg)  03/11/14 171 lb 12.8 oz (77.928 kg)   The microalbumin has been tested, and the result is normal Complications: Neuropathy  LABS:  Lab Results  Component Value Date   HGBA1C 7.2* 05/02/2014   HGBA1C 7.1* 12/10/2013   HGBA1C 7.4* 10/05/2013   Lab Results  Component Value Date   MICROALBUR 3.9* 10/05/2013   LDLCALC 133* 05/02/2014   CREATININE 1.1 05/02/2014        Medication List       This list is accurate as of: 05/30/14  3:52 PM.  Always use your most recent med list.               alendronate 70 MG tablet  Commonly known as:  FOSAMAX  Take 70 mg by mouth once a week. On Sundays     BIOFREEZE ROLL-ON EX  Apply 1 application topically daily as needed (pain).     calcium carbonate 750 MG chewable tablet  Commonly known as:  TUMS EX  Chew 1 tablet by mouth  2 (two) times daily as needed for heartburn (indigestion).     Calcium-Magnesium-Zinc Tabs  Take 1 tablet by mouth daily. Calcium 1000 mg, magnesium 400 mg, zinc 25 mg     cetirizine 10 MG tablet  Commonly known as:  ZYRTEC  Take 10 mg by mouth daily.     fluticasone 50 MCG/ACT nasal spray  Commonly known as:  FLONASE  Place 1 spray into both nostrils daily.     gabapentin 300 MG capsule  Commonly known as:  NEURONTIN  Take 1 capsule (300 mg total) by mouth 3 (three) times daily.     glimepiride 2 MG tablet  Commonly known as:  AMARYL  Take 2-4 mg by mouth 2 (two) times daily. Take 1 tablet (2 mg) every morning and 2 tablets (4 mg) every night     HUMALOG KWIKPEN 100 UNIT/ML  KiwkPen  Generic drug:  insulin lispro  INJECT 6 UNITS UNDER THE SKIN BEFORE BREAKFAST, 10 UNITS BEFORE LUNCH AND 9 UNITS BEFORE SUPPER     HYDROcodone-acetaminophen 5-325 MG per tablet  Commonly known as:  NORCO/VICODIN  Take 1 tablet by mouth every 4 (four) hours as needed for moderate pain (DO NOT EXCEED 4 GM OF TYLENOL IN 24 HOURS).     ibuprofen 800 MG tablet  Commonly known as:  ADVIL,MOTRIN  Take 800 mg by mouth 3 (three) times daily as needed (pain).     Insulin Pen Needle 32G X 4 MM Misc  Commonly known as:  BD PEN NEEDLE NANO U/F  Use as directed     LANTUS SOLOSTAR 100 UNIT/ML Solostar Pen  Generic drug:  Insulin Glargine  Inject 8-12 Units into the skin at bedtime. Inject 12 units at bedtime if AM glucose was > 90.  Inject 8 units at bedtime if AM glucose was <= 90.     LYRICA 50 MG capsule  Generic drug:  pregabalin     methocarbamol 500 MG tablet  Commonly known as:  ROBAXIN  Take 1 tablet (500 mg total) by mouth every 6 (six) hours as needed for muscle spasms.     ofloxacin 0.3 % ophthalmic solution  Commonly known as:  OCUFLOX  Place 1 drop into the left eye 2 (two) times daily.     oxyCODONE-acetaminophen 5-325 MG per tablet  Commonly known as:  PERCOCET/ROXICET  Take 1-2 tablets by mouth every 4 (four) hours as needed for moderate pain.     PATANASE 0.6 % Soln  Generic drug:  Olopatadine HCl  Place 1 puff into both nostrils 3 (three) times daily.     potassium chloride SA 20 MEQ tablet  Commonly known as:  K-DUR,KLOR-CON  Take 1 tablet (20 mEq total) by mouth daily.     pravastatin 40 MG tablet  Commonly known as:  PRAVACHOL  Take 1 tablet (40 mg total) by mouth daily.     PROLENSA 0.07 % Soln  Generic drug:  Bromfenac Sodium  Place 1 drop into the left eye every morning.     pyridOXINE 100 MG tablet  Commonly known as:  VITAMIN B-6  Take 100 mg by mouth daily.     TUSSIN DM PO  Take 5 mLs by mouth daily as needed (cough).      valsartan-hydrochlorothiazide 320-12.5 MG per tablet  Commonly known as:  DIOVAN-HCT  Take 1 tablet by mouth daily.     vitamin B-12 100 MCG tablet  Commonly known as:  CYANOCOBALAMIN  Take 100 mcg by mouth daily.  vitamin E 400 UNIT capsule  Take 400 Units by mouth daily.        Allergies:  Allergies  Allergen Reactions  . Betadine [Povidone Iodine] Swelling    Reaction to betadine eye drops  . Adhesive [Tape] Rash  . Lipitor [Atorvastatin] Rash    Past Medical History  Diagnosis Date  . Complication of anesthesia     wakes up shaking  . Diabetes mellitus   . Heart murmur     sees Dr. Montez Morita, last seen- early 2012  . Asthma     has used inhaler in past for asthmatic bronchitis, last time- early 2012  . Sleep apnea     borderline sleep apnea, states she no longer uses, early 2012- stopped using   . GERD (gastroesophageal reflux disease)     occas. use of  Prilosec  . Neuromuscular disorder     lumbar radiculopathy, lumbago  . Fibromyalgia   . Arthritis   . Hypertension     02/2010- stress test /w PCP  . Anemia     has sickle cell trait    Past Surgical History  Procedure Laterality Date  . Back surgery      2012  . Abdominal hysterectomy    . Adb.cyst      ovarian cyst  . Eye surgery      macular degeneration treatment - injections  . Ovarian cyst surgery      Family History  Problem Relation Age of Onset  . Anesthesia problems Neg Hx   . Hypotension Neg Hx   . Malignant hyperthermia Neg Hx   . Pseudochol deficiency Neg Hx     Social History:  reports that she quit smoking about 15 years ago. She does not have any smokeless tobacco history on file. She reports that she drinks alcohol. She reports that she does not use illicit drugs.  ROS   She is asking about ammonia smell to her urine but no burning or frequency   She has had problems with feet and legs tingling and also has some pain going down her legs. She has more trouble at night.   Lyrica is more beneficial than gabapentin and she is back on it, concerned about weight gain from this. Has not stopped her low-dose gabapentin yet  Diabetic foot exam shows normal monofilament sensation in the toes and plantar surfaces, no skin lesions or ulcers on the feet and normal pedal pulses Diabetic foot exam done in 1/16  Hyperlipidemia:   The lipid abnormality consists of elevated LDL , high triglyceride and Is being treated by PCP with Crestor However she is not taking this because of cost and was given Pravastatin instead on the last visit which she has started   Lab Results  Component Value Date   CHOL 215* 05/02/2014   HDL 49.00 05/02/2014   LDLCALC 133* 05/02/2014   LDLDIRECT 153.2 10/27/2012   TRIG 166.0* 05/02/2014   CHOLHDL 4 05/02/2014    HYPERTENSION:  Has been present for several years. This is treated by PCP  Currently only on Diovan HCT      Examination:   BP 132/84 mmHg  Pulse 81  Temp(Src) 98.1 F (36.7 C)  Resp 14  Ht 5\' 6"  (1.676 m)  Wt 170 lb 3.2 oz (77.202 kg)  BMI 27.48 kg/m2  SpO2 97%  Body mass index is 27.48 kg/(m^2).   No ankle edema   Assesment/Plan:   1. Diabetes type 2 with BMI of 27  The  patient's diabetes control appears to be somewhat better although difficult to assess since she has not had any recent labs and only a few readings after meals She has difficulty adjusting her mealtime dose based on what she is eating Also may be consistent with her diet and taking her Amaryl on time. She again does not want to consider additional medications like Invokana to help her control and limit weight gain  Recommendations:  She does agree to see the certified diabetes educator with the record of her food intake, pre-and postprandial blood sugars and insulin doses, discussed how this is to be done  Also discussed getting protein with breakfast consistently  She will continue Lantus unchanged unless her fasting blood sugars are  consistently higher  Advised her not to take any significant amount of insulin for correction of hypo-spending readings especially at bedtime which has caused an episode of hypoglycemia  Start regular exercise such as water aerobics  2. Hypertension: Blood pressure is fairly well controlled, followed by her cardiologist  3. Hyperlipidemia: Her lipids will need to be rechecked when she comes back, now on pravastatin  4.  Neuropathy: Symptoms are controlled with Lyrica and she will continue same doses  5.  Urinary odor: Will check urinalysis today.  Patient Instructions  Please check blood sugars at least half the time about 2 hours after any meal and label them as showed; check 3 times per week on waking up. Please bring blood sugar monitor to each visit. Recommended blood sugar levels about 2 hours after meal is 140-180 and on waking up 90-130  Adjust Humalog to keep 2 hour sugar controlled  Protein at Bfst daily     Counseling time over 50% of today's 25 minute visit  Valerya Maxton 05/30/2014, 3:52 PM

## 2014-05-30 NOTE — Patient Instructions (Signed)
Please check blood sugars at least half the time about 2 hours after any meal and label them as showed; check 3 times per week on waking up. Please bring blood sugar monitor to each visit. Recommended blood sugar levels about 2 hours after meal is 140-180 and on waking up 90-130  Adjust Humalog to keep 2 hour sugar controlled  Protein at Bfst daily

## 2014-05-31 NOTE — Progress Notes (Signed)
Quick Note:  Please let patient know that the lab result is normal and no further action needed ______ 

## 2014-06-02 DIAGNOSIS — H4311 Vitreous hemorrhage, right eye: Secondary | ICD-10-CM | POA: Diagnosis not present

## 2014-06-02 DIAGNOSIS — H3532 Exudative age-related macular degeneration: Secondary | ICD-10-CM | POA: Diagnosis not present

## 2014-06-02 DIAGNOSIS — K580 Irritable bowel syndrome with diarrhea: Secondary | ICD-10-CM | POA: Diagnosis not present

## 2014-06-02 DIAGNOSIS — Z1211 Encounter for screening for malignant neoplasm of colon: Secondary | ICD-10-CM | POA: Diagnosis not present

## 2014-06-08 DIAGNOSIS — H4311 Vitreous hemorrhage, right eye: Secondary | ICD-10-CM | POA: Diagnosis not present

## 2014-06-08 DIAGNOSIS — H3532 Exudative age-related macular degeneration: Secondary | ICD-10-CM | POA: Diagnosis not present

## 2014-06-08 DIAGNOSIS — H3561 Retinal hemorrhage, right eye: Secondary | ICD-10-CM | POA: Diagnosis not present

## 2014-06-20 ENCOUNTER — Encounter: Payer: Medicare HMO | Admitting: Nutrition

## 2014-06-30 DIAGNOSIS — Z961 Presence of intraocular lens: Secondary | ICD-10-CM | POA: Diagnosis not present

## 2014-07-03 ENCOUNTER — Other Ambulatory Visit: Payer: Self-pay | Admitting: Internal Medicine

## 2014-07-08 ENCOUNTER — Emergency Department (HOSPITAL_COMMUNITY)
Admission: EM | Admit: 2014-07-08 | Discharge: 2014-07-08 | Discharge status: Against Medical Advice | Payer: Medicare Other | Attending: Emergency Medicine | Admitting: Emergency Medicine

## 2014-07-08 ENCOUNTER — Encounter (HOSPITAL_COMMUNITY): Payer: Self-pay | Admitting: Emergency Medicine

## 2014-07-08 DIAGNOSIS — I10 Essential (primary) hypertension: Secondary | ICD-10-CM | POA: Insufficient documentation

## 2014-07-08 DIAGNOSIS — J45901 Unspecified asthma with (acute) exacerbation: Secondary | ICD-10-CM | POA: Diagnosis not present

## 2014-07-08 DIAGNOSIS — R011 Cardiac murmur, unspecified: Secondary | ICD-10-CM | POA: Diagnosis not present

## 2014-07-08 DIAGNOSIS — E119 Type 2 diabetes mellitus without complications: Secondary | ICD-10-CM | POA: Diagnosis not present

## 2014-07-08 DIAGNOSIS — R0602 Shortness of breath: Secondary | ICD-10-CM | POA: Insufficient documentation

## 2014-07-08 NOTE — ED Notes (Signed)
Pt reported she does not want to wait and is leaving.

## 2014-07-08 NOTE — ED Notes (Signed)
Pt reports she has hx of asthmatic bronchitis. Pt given nebulizer tx by MD in which pt states she felt as though her breathing was getting worse. Pt MD told pt to come to ED for possible injection.

## 2014-07-11 DIAGNOSIS — H3532 Exudative age-related macular degeneration: Secondary | ICD-10-CM | POA: Diagnosis not present

## 2014-07-11 DIAGNOSIS — H35051 Retinal neovascularization, unspecified, right eye: Secondary | ICD-10-CM | POA: Diagnosis not present

## 2014-07-13 DIAGNOSIS — J301 Allergic rhinitis due to pollen: Secondary | ICD-10-CM | POA: Diagnosis not present

## 2014-07-13 DIAGNOSIS — J3089 Other allergic rhinitis: Secondary | ICD-10-CM | POA: Diagnosis not present

## 2014-07-13 DIAGNOSIS — J4530 Mild persistent asthma, uncomplicated: Secondary | ICD-10-CM | POA: Diagnosis not present

## 2014-07-26 ENCOUNTER — Other Ambulatory Visit: Payer: Self-pay | Admitting: *Deleted

## 2014-07-26 ENCOUNTER — Encounter: Payer: Self-pay | Admitting: Endocrinology

## 2014-07-26 ENCOUNTER — Ambulatory Visit (INDEPENDENT_AMBULATORY_CARE_PROVIDER_SITE_OTHER): Payer: Medicare Other | Admitting: Endocrinology

## 2014-07-26 ENCOUNTER — Telehealth: Payer: Self-pay | Admitting: *Deleted

## 2014-07-26 VITALS — BP 142/90 | HR 80 | Temp 98.2°F | Resp 16 | Ht 66.0 in | Wt 170.8 lb

## 2014-07-26 DIAGNOSIS — E785 Hyperlipidemia, unspecified: Secondary | ICD-10-CM | POA: Diagnosis not present

## 2014-07-26 DIAGNOSIS — R252 Cramp and spasm: Secondary | ICD-10-CM

## 2014-07-26 DIAGNOSIS — I10 Essential (primary) hypertension: Secondary | ICD-10-CM

## 2014-07-26 DIAGNOSIS — IMO0002 Reserved for concepts with insufficient information to code with codable children: Secondary | ICD-10-CM

## 2014-07-26 DIAGNOSIS — E1165 Type 2 diabetes mellitus with hyperglycemia: Secondary | ICD-10-CM | POA: Diagnosis not present

## 2014-07-26 DIAGNOSIS — E876 Hypokalemia: Secondary | ICD-10-CM

## 2014-07-26 LAB — LIPID PANEL
CHOL/HDL RATIO: 3
Cholesterol: 226 mg/dL — ABNORMAL HIGH (ref 0–200)
HDL: 66.1 mg/dL (ref >39.00)
LDL Cholesterol: 141 mg/dL — ABNORMAL HIGH (ref 0–99)
NonHDL: 159.9
TRIGLYCERIDES: 96 mg/dL (ref 0.0–149.0)
VLDL: 19.2 mg/dL (ref 0.0–40.0)

## 2014-07-26 LAB — MICROALBUMIN / CREATININE URINE RATIO
Creatinine,U: 49.3 mg/dL
Microalb Creat Ratio: 8.9 mg/g (ref 0.0–30.0)
Microalb, Ur: 4.4 mg/dL — ABNORMAL HIGH (ref 0.0–1.9)

## 2014-07-26 LAB — COMPREHENSIVE METABOLIC PANEL
ALT: 14 U/L (ref 0–35)
AST: 16 U/L (ref 0–37)
Albumin: 4.2 g/dL (ref 3.5–5.2)
Alkaline Phosphatase: 63 U/L (ref 39–117)
BUN: 15 mg/dL (ref 6–23)
CALCIUM: 9.7 mg/dL (ref 8.4–10.5)
CO2: 28 mEq/L (ref 19–32)
CREATININE: 0.89 mg/dL (ref 0.40–1.20)
Chloride: 102 mEq/L (ref 96–112)
GFR: 79.6 mL/min (ref >60.00)
Glucose, Bld: 180 mg/dL — ABNORMAL HIGH (ref 70–99)
POTASSIUM: 3.7 meq/L (ref 3.5–5.1)
Sodium: 138 mEq/L (ref 135–145)
Total Bilirubin: 0.5 mg/dL (ref 0.2–1.2)
Total Protein: 7.1 g/dL (ref 6.0–8.3)

## 2014-07-26 LAB — HEMOGLOBIN A1C: Hgb A1c MFr Bld: 8.1 % — ABNORMAL HIGH (ref 4.6–6.5)

## 2014-07-26 LAB — MAGNESIUM: MAGNESIUM: 2.1 mg/dL (ref 1.5–2.5)

## 2014-07-26 MED ORDER — SPIRONOLACTONE 50 MG PO TABS: 50.0000 mg | ORAL_TABLET | Freq: Every day | ORAL | Status: DC

## 2014-07-26 NOTE — Telephone Encounter (Signed)
Reduce lantus to 8 units.

## 2014-07-26 NOTE — Telephone Encounter (Signed)
Patient called concerning her insulin dose tomorrow during her colonoscopy, she wants to know what dose she should take?

## 2014-07-26 NOTE — Progress Notes (Signed)
Patient ID: [name], female   DOB: Sep 07, 1940, 74 y.o.   MRN: [id number]    Reason for Appointment: leg cramps and Diabetes follow-up   History of Present Illness    PROBLEM 1: Leg cramps  She is coming in today for a visit due to leg cramps. She has had these for quite some time but she thinks these are worse about the last month or so.  She did not mention this on her last visit in 2/16 She blames this on her pravastatin which was started in 1/16 However she is not having any muscle aches but mostlycalf muscle cramps which are usually at night She is very concerned about her muscle cramps.  She thinks these are not better with her trying to increase her potassium intake with OTC supplements and bananas. She is also taking OTC magnesium supplements, up to 1000 mg a day Has not discussed this with PCP. She has not had muscle cramps with any other medications   Problem 2: Type 2 diabetes mellitus, date of diagnosis: 1998.   Prior history: She had previously been treated with Byetta, Glumetza, Amaryl, Victoza and  Onglyza However because of inadequate control and intolerance to drugs she was finally given pre-meal insulin along with Amaryl, Glumetza eventually stopped because of side effects and also Lantus added  Recent history:  The insulin regimen is: Humalog 6-10 ac   Lantus insulin 12 units daily hs .  Her A1c has been fairly consistent between 7.1-7.4 previously She is currently taking low doses of basal bolus insulin regimen along with Amaryl She was taking 4 mg of Amaryl at night but has reduced this to 2 mg because of fear of this causing muscle cramps She has not brought her monitor for download and she thinks her blood sugars are higher, up to 200 and the morning Recently fasting glucose was 147 She thinks some of her high readings have been from stress and inability to exercise  Had previously tried Victoza which caused nausea Hypoglycemia: None  recently  Monitors blood glucose: 1.5 times a day, checking blood sugars mostly before meals and has not brought her monitor today Glucometer: One Touch.  Meals: 2-3 meals per day at 10 am. 2 pm and 6-8 pm but inconsistent schedule.  Will sometimes get protein at breakfast. Her snacks may be popcorn or chips   Physical activity: exercise:  none Certified Diabetes Educator visit: Most recent: 7/13.  Dietician visit: Most recent: 3/13.   Wt Readings from Last 3 Encounters:  07/26/14 170 lb 12.8 oz (77.474 kg)  05/30/14 170 lb 3.2 oz (77.202 kg)  05/02/14 166 lb 12.8 oz (75.66 kg)   The microalbumin has been tested, and the result is normal Complications: Neuropathy  LABS:  Lab Results  Component Value Date   HGBA1C 8.1* 07/26/2014   HGBA1C 7.2* 05/02/2014   HGBA1C 7.1* 12/10/2013   Lab Results  Component Value Date   MICROALBUR 4.4* 07/26/2014   LDLCALC 141* 07/26/2014   CREATININE 0.89 07/26/2014        Medication List       This list is accurate as of: 07/26/14  2:19 PM.  Always use your most recent med list.               alendronate 70 MG tablet  Commonly known as:  FOSAMAX  Take 70 mg by mouth once a week. On Sundays     BIOFREEZE ROLL-ON EX  Apply 1 application topically daily as  needed (pain).     calcium carbonate 750 MG chewable tablet  Commonly known as:  TUMS EX  Chew 1 tablet by mouth 2 (two) times daily as needed for heartburn (indigestion).     Calcium-Magnesium-Zinc Tabs  Take 1 tablet by mouth daily. Calcium 1000 mg, magnesium 400 mg, zinc 25 mg     cetirizine 10 MG tablet  Commonly known as:  ZYRTEC  Take 10 mg by mouth daily.     fluticasone 50 MCG/ACT nasal spray  Commonly known as:  FLONASE  Place 1 spray into both nostrils daily.     gabapentin 300 MG capsule  Commonly known as:  NEURONTIN  Take 1 capsule (300 mg total) by mouth 3 (three) times daily.     glimepiride 2 MG tablet  Commonly known as:  AMARYL  Take 2-4 mg by  mouth 2 (two) times daily. Take 1 tablet (2 mg) every morning and 2 tablets (4 mg) every night     HUMALOG KWIKPEN 100 UNIT/ML KiwkPen  Generic drug:  insulin lispro  INJECT 6 UNITS UNDER THE SKIN BEFORE BREAKFAST, 10 UNITS BEFORE LUNCH AND 9 UNITS BEFORE SUPPER     HYDROcodone-acetaminophen 5-325 MG per tablet  Commonly known as:  NORCO/VICODIN  Take 1 tablet by mouth every 4 (four) hours as needed for moderate pain (DO NOT EXCEED 4 GM OF TYLENOL IN 24 HOURS).     ibuprofen 800 MG tablet  Commonly known as:  ADVIL,MOTRIN  Take 800 mg by mouth 3 (three) times daily as needed (pain).     Insulin Pen Needle 32G X 4 MM Misc  Commonly known as:  BD PEN NEEDLE NANO U/F  Use as directed     LANTUS SOLOSTAR 100 UNIT/ML Solostar Pen  Generic drug:  Insulin Glargine  Inject 8-12 Units into the skin at bedtime. Inject 12 units at bedtime if AM glucose was > 90.  Inject 8 units at bedtime if AM glucose was <= 90.     LYRICA 50 MG capsule  Generic drug:  pregabalin     methocarbamol 500 MG tablet  Commonly known as:  ROBAXIN  Take 1 tablet (500 mg total) by mouth every 6 (six) hours as needed for muscle spasms.     ofloxacin 0.3 % ophthalmic solution  Commonly known as:  OCUFLOX  Place 1 drop into the left eye 2 (two) times daily.     oxyCODONE-acetaminophen 5-325 MG per tablet  Commonly known as:  PERCOCET/ROXICET  Take 1-2 tablets by mouth every 4 (four) hours as needed for moderate pain.     PATANASE 0.6 % Soln  Generic drug:  Olopatadine HCl  Place 1 puff into both nostrils 3 (three) times daily.     potassium chloride SA 20 MEQ tablet  Commonly known as:  K-DUR,KLOR-CON  Take 1 tablet (20 mEq total) by mouth daily.     pravastatin 40 MG tablet  Commonly known as:  PRAVACHOL  Take 1 tablet (40 mg total) by mouth daily.     PROLENSA 0.07 % Soln  Generic drug:  Bromfenac Sodium  Place 1 drop into the left eye every morning.     pyridOXINE 100 MG tablet  Commonly known as:   VITAMIN B-6  Take 100 mg by mouth daily.     TUSSIN DM PO  Take 5 mLs by mouth daily as needed (cough).     valsartan-hydrochlorothiazide 320-12.5 MG per tablet  Commonly known as:  DIOVAN-HCT  Take 1 tablet  by mouth daily.     vitamin B-12 100 MCG tablet  Commonly known as:  CYANOCOBALAMIN  Take 100 mcg by mouth daily.     vitamin E 400 UNIT capsule  Take 400 Units by mouth daily.        Allergies:  Allergies  Allergen Reactions  . Betadine [Povidone Iodine] Swelling    Reaction to betadine eye drops  . Adhesive [Tape] Rash  . Lipitor [Atorvastatin] Rash    Past Medical History  Diagnosis Date  . Complication of anesthesia     wakes up shaking  . Diabetes mellitus   . Heart murmur     sees Dr. Montez Morita, last seen- early 2012  . Asthma     has used inhaler in past for asthmatic bronchitis, last time- early 2012  . Sleep apnea     borderline sleep apnea, states she no longer uses, early 2012- stopped using   . GERD (gastroesophageal reflux disease)     occas. use of  Prilosec  . Neuromuscular disorder     lumbar radiculopathy, lumbago  . Fibromyalgia   . Arthritis   . Hypertension     02/2010- stress test /w PCP  . Anemia     has sickle cell trait    Past Surgical History  Procedure Laterality Date  . Back surgery      2012  . Abdominal hysterectomy    . Adb.cyst      ovarian cyst  . Eye surgery      macular degeneration treatment - injections  . Ovarian cyst surgery      Family History  Problem Relation Age of Onset  . Anesthesia problems Neg Hx   . Hypotension Neg Hx   . Malignant hyperthermia Neg Hx   . Pseudochol deficiency Neg Hx     Social History:  reports that she quit smoking about 15 years ago. She does not have any smokeless tobacco history on file. She reports that she drinks alcohol. She reports that she does not use illicit drugs.  ROS    She has had tingling infeet and legs and also has pain going down her legs especially at  night. Lyrica is more beneficial than gabapentin and she is back on it  Diabetic foot exam shows normal monofilament sensation in the toes and plantar surfaces, no skin lesions or ulcers on the feet and normal pedal pulses Diabetic foot exam done in 1/16  Hyperlipidemia:   The lipid abnormality consists of elevated LDL , high triglyceride and Is being treated by PCP with Crestor However she is not taking this because of cost and was given Pravastatin in 1/16   Lab Results  Component Value Date   CHOL 226* 07/26/2014   HDL 66.10 07/26/2014   LDLCALC 141* 07/26/2014   LDLDIRECT 153.2 10/27/2012   TRIG 96.0 07/26/2014   CHOLHDL 3 07/26/2014    HYPERTENSION:  Has been present for several years. This is treated by PCP  Currently only on Diovan HCT without adequate control  She is asking about thyroid functions  Lab Results  Component Value Date   TSH 2.26 10/05/2013        Examination:   BP 142/90 mmHg  Pulse 80  Temp(Src) 98.2 F (36.8 C)  Resp 16  Ht 5\' 6"  (1.676 m)  Wt 170 lb 12.8 oz (77.474 kg)  BMI 27.58 kg/m2  SpO2 97%  Body mass index is 27.58 kg/(m^2).    No ankle edema   Assesment/Plan:  1. LEG cramps  She is likely to be getting idiopathic leg cramps since these are long-standing.  Not clear why these are worse recently but will need to check her potassium level as it was low on her previous labs.  She has taken supplements previously She is already taking magnesium supplements Discussed general measures for treating cramps including stretching exercises at bedtime and tonic water which has some quinine  2. Diabetes type 2 with BMI of 27  The patient's diabetes control appears to be worse as judged by her history and she thinks her fasting readings are higher Most likely this is related to her inconsistent regimen of diet and exercise as well as probably requiring higher doses of insulin now She may have been benefiting from Amaryl because she has  reduced the dose to half of her usual 4 mg dose and blood sugars are higher in the morning Discussed that she will need to increase her Lantus to keep morning sugar is under control as before and bring her monitor for review next time She has been given options of using medications like Invokana since she is concerned about her weight loss difficulty but she has refused this because of cost She was also asked to see diabetes educator but did not make an appointment to do this  However she is agreeing to start her water aerobics now for exercise  2. Hypertension: Blood pressure is not well controlled now, she thinks this is partly from stress but her diastolic is higher than usual. Would consider adding Aldactone which will also help her tendency to hypokalemia Labs to be checked today as baseline  3. Hyperlipidemia: Her lipids will need to be rechecked.  She is taking pravastatin.  Do not think this is causing leg cramps but she can leave it off for a couple of weeks to see how she does May consider putting her back on Lescol XL    Patient Instructions  Stretching of legs at bedtime; may try Tonic water  Please check blood sugars at least half the time about 2 hours after any meal and 3 times per week on waking up. Please bring blood sugar monitor to each visit. Recommended blood sugar levels about 2 hours after meal is 140-180 and on waking up 90-130  Lantus 14 until am sugar < 100  May stop Pravachol   Counseling time over 50% of today's 25 minute visit  Jordanna Hendrie 07/26/2014, 2:19 PM   Addendum: Labs show A1c 8.1, glucose 180 and potassium normal To start Aldactone as discussed   Office Visit on 07/26/2014  Component Date Value Ref Range Status  . Hgb A1c MFr Bld 07/26/2014 8.1* 4.6 - 6.5 % Final   Glycemic Control Guidelines for People with Diabetes:Non Diabetic:  <6%Goal of Therapy: <7%Additional Action Suggested:  >8%   . Sodium 07/26/2014 138  135 - 145 mEq/L Final  .  Potassium 07/26/2014 3.7  3.5 - 5.1 mEq/L Final  . Chloride 07/26/2014 102  96 - 112 mEq/L Final  . CO2 07/26/2014 28  19 - 32 mEq/L Final  . Glucose, Bld 07/26/2014 180* 70 - 99 mg/dL Final  . BUN 07/26/2014 15  6 - 23 mg/dL Final  . Creatinine, Ser 07/26/2014 0.89  0.40 - 1.20 mg/dL Final  . Total Bilirubin 07/26/2014 0.5  0.2 - 1.2 mg/dL Final  . Alkaline Phosphatase 07/26/2014 63  39 - 117 U/L Final  . AST 07/26/2014 16  0 - 37 U/L Final  . ALT  07/26/2014 14  0 - 35 U/L Final  . Total Protein 07/26/2014 7.1  6.0 - 8.3 g/dL Final  . Albumin 07/26/2014 4.2  3.5 - 5.2 g/dL Final  . Calcium 07/26/2014 9.7  8.4 - 10.5 mg/dL Final  . GFR 07/26/2014 79.60  >60.00 mL/min Final  . Cholesterol 07/26/2014 226* 0 - 200 mg/dL Final   ATP III Classification       Desirable:  < 200 mg/dL               Borderline High:  200 - 239 mg/dL          High:  > = 240 mg/dL  . Triglycerides 07/26/2014 96.0  0.0 - 149.0 mg/dL Final   Normal:  <150 mg/dLBorderline High:  150 - 199 mg/dL  . HDL 07/26/2014 66.10  >39.00 mg/dL Final  . VLDL 07/26/2014 19.2  0.0 - 40.0 mg/dL Final  . LDL Cholesterol 07/26/2014 141* 0 - 99 mg/dL Final  . Total CHOL/HDL Ratio 07/26/2014 3   Final                  Men          Women1/2 Average Risk     3.4          3.3Average Risk          5.0          4.42X Average Risk          9.6          7.13X Average Risk          15.0          11.0                      . NonHDL 07/26/2014 159.90   Final   NOTE:  Non-HDL goal should be 30 mg/dL higher than patient's LDL goal (i.e. LDL goal of < 70 mg/dL, would have non-HDL goal of < 100 mg/dL)  . Microalb, Ur 07/26/2014 4.4* 0.0 - 1.9 mg/dL Final  . Creatinine,U 07/26/2014 49.3   Final  . Microalb Creat Ratio 07/26/2014 8.9  0.0 - 30.0 mg/g Final  . Magnesium 07/26/2014 2.1  1.5 - 2.5 mg/dL Final

## 2014-07-26 NOTE — Progress Notes (Signed)
Quick Note:  Please let patient know that the potassium is normal, no need for potassium supplements but start ALDACTONE 50 mg daily as discussed for blood pressure and potassium Blood sugars are not controlled with A1c 8.1, needs to increase Lantus as discussed Also cholesterol is still high at 226. Will discuss this further later ______

## 2014-07-26 NOTE — Telephone Encounter (Signed)
Noted, patient is aware. 

## 2014-07-26 NOTE — Patient Instructions (Signed)
Stretching of legs at bedtime; may try Tonic water  Please check blood sugars at least half the time about 2 hours after any meal and 3 times per week on waking up. Please bring blood sugar monitor to each visit. Recommended blood sugar levels about 2 hours after meal is 140-180 and on waking up 90-130  Lantus 14 until am sugar < 100  May stop Pravachol

## 2014-07-27 DIAGNOSIS — Z86010 Personal history of colonic polyps: Secondary | ICD-10-CM | POA: Diagnosis not present

## 2014-07-27 DIAGNOSIS — K5730 Diverticulosis of large intestine without perforation or abscess without bleeding: Secondary | ICD-10-CM | POA: Diagnosis not present

## 2014-07-27 DIAGNOSIS — Z1211 Encounter for screening for malignant neoplasm of colon: Secondary | ICD-10-CM | POA: Diagnosis not present

## 2014-07-27 DIAGNOSIS — R195 Other fecal abnormalities: Secondary | ICD-10-CM | POA: Diagnosis not present

## 2014-08-04 DIAGNOSIS — Z6830 Body mass index (BMI) 30.0-30.9, adult: Secondary | ICD-10-CM | POA: Insufficient documentation

## 2014-08-04 DIAGNOSIS — M4806 Spinal stenosis, lumbar region: Secondary | ICD-10-CM | POA: Diagnosis not present

## 2014-08-08 ENCOUNTER — Telehealth: Payer: Self-pay | Admitting: Endocrinology

## 2014-08-08 NOTE — Telephone Encounter (Signed)
Patient ask you to call her when you get a chance °

## 2014-08-08 NOTE — Telephone Encounter (Signed)
Please find out what specifically she has questions about

## 2014-08-09 DIAGNOSIS — K5730 Diverticulosis of large intestine without perforation or abscess without bleeding: Secondary | ICD-10-CM | POA: Diagnosis not present

## 2014-08-09 DIAGNOSIS — R195 Other fecal abnormalities: Secondary | ICD-10-CM | POA: Diagnosis not present

## 2014-08-09 DIAGNOSIS — Z86010 Personal history of colonic polyps: Secondary | ICD-10-CM | POA: Diagnosis not present

## 2014-08-09 NOTE — Telephone Encounter (Signed)
Left message for her to call back

## 2014-08-19 DIAGNOSIS — Z803 Family history of malignant neoplasm of breast: Secondary | ICD-10-CM | POA: Diagnosis not present

## 2014-08-19 DIAGNOSIS — Z1231 Encounter for screening mammogram for malignant neoplasm of breast: Secondary | ICD-10-CM | POA: Diagnosis not present

## 2014-08-25 ENCOUNTER — Other Ambulatory Visit (INDEPENDENT_AMBULATORY_CARE_PROVIDER_SITE_OTHER): Payer: Medicare Other

## 2014-08-25 ENCOUNTER — Other Ambulatory Visit: Payer: Medicare Other

## 2014-08-25 ENCOUNTER — Other Ambulatory Visit: Payer: Self-pay | Admitting: *Deleted

## 2014-08-25 DIAGNOSIS — E1165 Type 2 diabetes mellitus with hyperglycemia: Secondary | ICD-10-CM

## 2014-08-25 DIAGNOSIS — E1140 Type 2 diabetes mellitus with diabetic neuropathy, unspecified: Secondary | ICD-10-CM | POA: Diagnosis not present

## 2014-08-25 DIAGNOSIS — IMO0002 Reserved for concepts with insufficient information to code with codable children: Secondary | ICD-10-CM

## 2014-08-25 LAB — BASIC METABOLIC PANEL
BUN: 20 mg/dL (ref 6–23)
CHLORIDE: 101 meq/L (ref 96–112)
CO2: 30 meq/L (ref 19–32)
CREATININE: 1.21 mg/dL — AB (ref 0.40–1.20)
Calcium: 10 mg/dL (ref 8.4–10.5)
GFR: 55.83 mL/min — ABNORMAL LOW (ref >60.00)
GLUCOSE: 129 mg/dL — AB (ref 70–99)
POTASSIUM: 4.3 meq/L (ref 3.5–5.1)
Sodium: 138 mEq/L (ref 135–145)

## 2014-08-26 LAB — FRUCTOSAMINE: Fructosamine: 297 umol/L — ABNORMAL HIGH (ref 0–285)

## 2014-08-27 NOTE — Progress Notes (Signed)
Patient ID: [name], female   DOB: 07-07-1940, 74 y.o.   MRN: [id number]   03/11/14  Facility:  [facility] Home Location:  [place] Room Number: 706-P LEVEL OF CARE:  SNF (31)   Chief Complaint  Patient presents with  . Discharge Note    Spinal stenosis of lumbar region with radiculopathy, Osteoporosis, Allergic rhinitis, Neuropathy, Diabetes Mellitus, Hypokalemia, Hyperlipidemia and Anemia    HISTORY OF PRESENT ILLNESS:  This is a 74 year old female who is for discharge home with Home health PT for endurance and OT for ADLs. She has been admitted to [facility] on 03/04/14 from [facility] with Spinal stenoses of the lumbar region with radiculopathy S/P decompression of L3 and L4 with decompression of L3-L4 and L5 nerve roots posterior lumbar interbody arthrodeses L3-4 and L4-5 posterior segment fixation L3-L5.    Patient was admitted to this facility for short-term rehabilitation after the patient's recent hospitalization.  Patient has completed SNF rehabilitation and therapy has cleared the patient for discharge.  REASSESSMENT OF ONGOING PROBLEMS:  PERIPHERAL NEUROPATHY: The peripheral neuropathy is stable. The patient denies pain in the feet, tingling, and numbness. No complications noted from the medication presently being used.  DM:pt's DM remains stable.  Pt denies polyuria, polydipsia, polyphagia, changes in vision or hypoglycemic episodes.  No complications noted from the medication presently being used.   8/15 hemoglobin A1c is: 7.1  ALLERGIC RHINITIS: Allergic rhinitis remains stable.  Patient denies ongoing symptoms such as runny nose sneezing or tearing. No complications reported from the current medication(s) being used.  OSTEOPOROSIS: The patient's osteoporosis remains stable. The patient denies recent worsening of pain and the range of motion remains stable. There has not been any evidence of fractures. No complications noted  from the medications presently being used.   PAST MEDICAL HISTORY:  Past Medical History  Diagnosis Date  . Complication of anesthesia     wakes up shaking  . Diabetes mellitus   . Heart murmur     sees Dr. Montez Morita, last seen- early 2012  . Asthma     has used inhaler in past for asthmatic bronchitis, last time- early 2012  . Sleep apnea     borderline sleep apnea, states she no longer uses, early 2012- stopped using   . GERD (gastroesophageal reflux disease)     occas. use of  Prilosec  . Neuromuscular disorder     lumbar radiculopathy, lumbago  . Fibromyalgia   . Arthritis   . Hypertension     02/2010- stress test /w PCP  . Anemia     has sickle cell trait    CURRENT MEDICATIONS: Reviewed per MAR/see medication list  Allergies  Allergen Reactions  . Betadine [Povidone Iodine] Swelling    Reaction to betadine eye drops  . Adhesive [Tape] Rash  . Lipitor [Atorvastatin] Rash     REVIEW OF SYSTEMS:  GENERAL: no change in appetite, no fatigue, no weight changes, no fever, chills or weakness RESPIRATORY: no cough, SOB, DOE, wheezing, hemoptysis CARDIAC: no chest pain, edema or palpitations GI: no abdominal pain, diarrhea, constipation, heart burn, nausea or vomiting  PHYSICAL EXAMINATION  GENERAL: no acute distress, normal body habitus SKIN:  Lower back midline incision is dry, no erythema NECK: supple, trachea midline, no neck masses, no thyroid tenderness, no thyromegaly LYMPHATICS: no LAN in the neck, no supraclavicular LAN RESPIRATORY: breathing is even & unlabored, BS CTAB CARDIAC: RRR, no murmur,no extra heart sounds, no edema  GI: abdomen soft, normal BS, no masses, no tenderness, no hepatomegaly, no splenomegaly EXTREMITIES: able to move all 4 extremities PSYCHIATRIC: the patient is alert & oriented to person, affect & behavior appropriate  LABS/RADIOLOGY: Labs reviewed: Basic Metabolic Panel:  Recent Labs  05/02/14 1047 07/26/14 0929 08/25/14 1016    NA 142 138 138  K 3.2* 3.7 4.3  CL 101 102 101  CO2 32 28 30  GLUCOSE 46* 180* 129*  BUN 16 15 20   CREATININE 1.1 0.89 1.21*  CALCIUM 10.0 9.7 10.0  MG  --  2.1  --    Liver Function Tests:  Recent Labs  03/01/14 0200 05/02/14 1047 07/26/14 0929  AST 31 22 16   ALT 16 15 14   ALKPHOS 34* 68 63  BILITOT 0.3 0.5 0.5  PROT 5.5* 7.2 7.1  ALBUMIN 3.4* 4.3 4.2   CBC:  Recent Labs  02/25/14 1352 03/01/14 0200  WBC 5.9 8.3  HGB 13.2 10.3*  HCT 39.8 31.1*  MCV 82.1 80.8  PLT 202 157   Lipid Panel:  Recent Labs  10/05/13 1039 05/02/14 1047 07/26/14 0929  HDL 48.50 49.00 66.10   BNP: Invalid input(s): POCBNP CBG:  Recent Labs  03/03/14 1659 03/03/14 2054 03/04/14 0656  GLUCAP 179* 207* 126*    ASSESSMENT/PLAN:  Spinal stenoses of the lumbar region with radiculopathy  - for home health PT and OT; continue Percocet 5/325 mg 1-2 tabs by mouth every 4 hours when necessary for pain; and Robaxin 500 mg 1 tab by mouth every 6 hours when necessary for muscle spasm Osteoporosis - stable; continue Fosamax 70 mg by mouth every weekly (Sundays) Allergic rhinitis - stable; continue Zyrtec 10 mg by mouth daily and Flonase 50 g/ACT 1 spray to each nostril daily Neuropathy - stable; continue Neurontin 200 mg by mouth every morning and 300 mg by mouth daily at bedtime and Lyrica 50 mg by mouth twice a day Diabetes mellitus with neurological complication - well controlled; continue Amaryl 2 mg by mouth every morning and 4 mg by mouth every night, Humalog 4 units before breakfast and 6 units before lunch and dinner and Lantus 12 units at bedtime if a.m. Glucose > 90 and 8 units at at bedtime if glucose was <=90 Hypokalemia - K=4.3 ; continue K Dur 20 meq by mouth daily Hyperlipidemia - continue Crestor 10 mg by mouth daily at bedtime Anemia, acute blood loss - stable; hemoglobin 10.3    I have filled out patient's discharge paperwork and written prescriptions.  Patient will  receive home health PT and OT.  Total discharge time: Less than 30 minutes  Discharge time involved coordination of the discharge process with Education officer, museum, nursing staff and therapy department. Medical justification for home health services verified.    [facility], NP [employer] [phone]

## 2014-08-29 ENCOUNTER — Ambulatory Visit (INDEPENDENT_AMBULATORY_CARE_PROVIDER_SITE_OTHER): Payer: Medicare Other | Admitting: Endocrinology

## 2014-08-29 ENCOUNTER — Encounter: Payer: Self-pay | Admitting: Endocrinology

## 2014-08-29 VITALS — BP 126/68 | HR 86 | Temp 98.0°F | Resp 16 | Ht 66.0 in | Wt 163.2 lb

## 2014-08-29 DIAGNOSIS — E1165 Type 2 diabetes mellitus with hyperglycemia: Secondary | ICD-10-CM | POA: Diagnosis not present

## 2014-08-29 DIAGNOSIS — I10 Essential (primary) hypertension: Secondary | ICD-10-CM

## 2014-08-29 DIAGNOSIS — E785 Hyperlipidemia, unspecified: Secondary | ICD-10-CM

## 2014-08-29 DIAGNOSIS — Z23 Encounter for immunization: Secondary | ICD-10-CM | POA: Diagnosis not present

## 2014-08-29 DIAGNOSIS — IMO0002 Reserved for concepts with insufficient information to code with codable children: Secondary | ICD-10-CM

## 2014-08-29 NOTE — Patient Instructions (Signed)
Please check blood sugars at least half the time about 2-3 hours after any meal and 3 times per week on waking up.  Please bring blood sugar monitor to each visit.  Recommended blood sugar levels about 2 hours after meal is 140-180 and on waking up 90-130  Stop Glimeperide and potassium  May adjust Lantus only if waking up with readings out of above range

## 2014-08-29 NOTE — Progress Notes (Signed)
Patient ID: [name], female   DOB: 12/29/1940, 74 y.o.   MRN: [id number]    Reason for Appointment: leg cramps and Diabetes follow-up   History of Present Illness    PROBLEM 1:   Problem 2: Type 2 diabetes mellitus, date of diagnosis: 1998.   Prior history: She had previously been treated with Byetta, Glumetza, Amaryl, Victoza and  Onglyza However because of inadequate control and intolerance to drugs she was finally given pre-meal insulin along with Amaryl, Glumetza eventually stopped because of side effects and also Lantus added  Recent history:  The insulin regimen is: Humalog 3-5 at Bfst/lunch---6-8 acs   Lantus insulin 9 units daily hs   Her A1c has been fairly consistent between 7.1-7.4 previously and was markedly increased at 8.1 in 4/16 She thinks previously most of her high readings have been from stress and inability to exercise  Since her last visit she has started exercising regularly with water aerobics Also has started cutting back on her portions and snacks and eating healthier She thinks her stress level is also better Although she was asked to increase her Lantus she has reduced the dose and is generally taking less insulin at meals also She is however adjusting this somewhat based on her food intake in the evening instead of morning sugars She is now taking 1 mg of Amaryl bid Recently fasting glucose was 129 in the lab  Had previously tried Victoza which caused nausea Hypoglycemia: None recently but did have a low sugar of 61 overnight on 08/06/14  Monitors blood glucose: 1-2 times a day, checking blood sugars mostly before meals and primarily in the mornings Glucometer: One Touch.   PRE-MEAL Breakfast Lunch  afternoon   PCS  Overall  Glucose range:  77-152   94-137   79-158   151-194    Mean/median:  102      113/98     Meals: 2-3 meals per day at 10 am. 2 pm and 6-8 pm but inconsistent schedule.  Recently having weight watchers meals especially at  lunch Will sometimes get protein at breakfast. Her snacks may be popcorn or chips   Physical activity: exercise:  Water aerobics 3/7 days a week now  Certified Diabetes Educator visit: Most recent: 7/13.  Dietician visit: Most recent: 3/13.   Wt Readings from Last 3 Encounters:  08/29/14 163 lb 3.2 oz (74.027 kg)  07/26/14 170 lb 12.8 oz (77.474 kg)  05/30/14 170 lb 3.2 oz (77.202 kg)   The microalbumin has been tested, and the result is normal Complications: Neuropathy  LABS:  Lab Results  Component Value Date   HGBA1C 8.1* 07/26/2014   HGBA1C 7.2* 05/02/2014   HGBA1C 7.1* 12/10/2013   Lab Results  Component Value Date   MICROALBUR 4.4* 07/26/2014   LDLCALC 141* 07/26/2014   CREATININE 1.21* 08/25/2014        Medication List       This list is accurate as of: 08/29/14 12:52 PM.  Always use your most recent med list.               alendronate 70 MG tablet  Commonly known as:  FOSAMAX  Take 70 mg by mouth once a week. On Sundays     BIOFREEZE ROLL-ON EX  Apply 1 application topically daily as needed (pain).     calcium carbonate 750 MG chewable tablet  Commonly known as:  TUMS EX  Chew 1 tablet by mouth 2 (two) times daily as needed  for heartburn (indigestion).     Calcium-Magnesium-Zinc Tabs  Take 1 tablet by mouth daily. Calcium 1000 mg, magnesium 400 mg, zinc 25 mg     cetirizine 10 MG tablet  Commonly known as:  ZYRTEC  Take 10 mg by mouth daily.     CRESTOR 10 MG tablet  Generic drug:  rosuvastatin     fluticasone 50 MCG/ACT nasal spray  Commonly known as:  FLONASE  Place 1 spray into both nostrils daily.     gabapentin 300 MG capsule  Commonly known as:  NEURONTIN  Take 1 capsule (300 mg total) by mouth 3 (three) times daily.     glimepiride 2 MG tablet  Commonly known as:  AMARYL  Take 2 mg by mouth 2 (two) times daily. Take 1/2 tablet  every morning  And 1/2 tablet at night     HUMALOG KWIKPEN 100 UNIT/ML KiwkPen  Generic drug:  insulin  lispro  INJECT 6 UNITS UNDER THE SKIN BEFORE BREAKFAST, 10 UNITS BEFORE LUNCH AND 9 UNITS BEFORE SUPPER     HYDROcodone-acetaminophen 5-325 MG per tablet  Commonly known as:  NORCO/VICODIN  Take 1 tablet by mouth every 4 (four) hours as needed for moderate pain (DO NOT EXCEED 4 GM OF TYLENOL IN 24 HOURS).     ibuprofen 800 MG tablet  Commonly known as:  ADVIL,MOTRIN  Take 800 mg by mouth 3 (three) times daily as needed (pain).     Insulin Pen Needle 32G X 4 MM Misc  Commonly known as:  BD PEN NEEDLE NANO U/F  Use as directed     LANTUS SOLOSTAR 100 UNIT/ML Solostar Pen  Generic drug:  Insulin Glargine  Inject 8 Units into the skin at bedtime. Inject 12 units at bedtime if AM glucose was > 90.  Inject 8 units at bedtime if AM glucose was <= 90.     LYRICA 50 MG capsule  Generic drug:  pregabalin     methocarbamol 500 MG tablet  Commonly known as:  ROBAXIN  Take 1 tablet (500 mg total) by mouth every 6 (six) hours as needed for muscle spasms.     ofloxacin 0.3 % ophthalmic solution  Commonly known as:  OCUFLOX  Place 1 drop into the left eye 2 (two) times daily.     oxyCODONE-acetaminophen 5-325 MG per tablet  Commonly known as:  PERCOCET/ROXICET  Take 1-2 tablets by mouth every 4 (four) hours as needed for moderate pain.     PATANASE 0.6 % Soln  Generic drug:  Olopatadine HCl  Place 1 puff into both nostrils 3 (three) times daily.     potassium chloride SA 20 MEQ tablet  Commonly known as:  K-DUR,KLOR-CON  Take 1 tablet (20 mEq total) by mouth daily.     pravastatin 40 MG tablet  Commonly known as:  PRAVACHOL  Take 1 tablet (40 mg total) by mouth daily.     PROLENSA 0.07 % Soln  Generic drug:  Bromfenac Sodium  Place 1 drop into the left eye every morning.     pyridOXINE 100 MG tablet  Commonly known as:  VITAMIN B-6  Take 100 mg by mouth daily.     spironolactone 50 MG tablet  Commonly known as:  ALDACTONE  Take 1 tablet (50 mg total) by mouth daily.      TUSSIN DM PO  Take 5 mLs by mouth daily as needed (cough).     valsartan-hydrochlorothiazide 320-12.5 MG per tablet  Commonly known as:  DIOVAN-HCT  Take 1 tablet by mouth daily.     vitamin B-12 100 MCG tablet  Commonly known as:  CYANOCOBALAMIN  Take 100 mcg by mouth daily.     vitamin E 400 UNIT capsule  Take 400 Units by mouth daily.        Allergies:  Allergies  Allergen Reactions  . Betadine [Povidone Iodine] Swelling    Reaction to betadine eye drops  . Adhesive [Tape] Rash  . Lipitor [Atorvastatin] Rash    Past Medical History  Diagnosis Date  . Complication of anesthesia     wakes up shaking  . Diabetes mellitus   . Heart murmur     sees Dr. Montez Morita, last seen- early 2012  . Asthma     has used inhaler in past for asthmatic bronchitis, last time- early 2012  . Sleep apnea     borderline sleep apnea, states she no longer uses, early 2012- stopped using   . GERD (gastroesophageal reflux disease)     occas. use of  Prilosec  . Neuromuscular disorder     lumbar radiculopathy, lumbago  . Fibromyalgia   . Arthritis   . Hypertension     02/2010- stress test /w PCP  . Anemia     has sickle cell trait    Past Surgical History  Procedure Laterality Date  . Back surgery      2012  . Abdominal hysterectomy    . Adb.cyst      ovarian cyst  . Eye surgery      macular degeneration treatment - injections  . Ovarian cyst surgery      Family History  Problem Relation Age of Onset  . Anesthesia problems Neg Hx   . Hypotension Neg Hx   . Malignant hyperthermia Neg Hx   . Pseudochol deficiency Neg Hx     Social History:  reports that she quit smoking about 15 years ago. She does not have any smokeless tobacco history on file. She reports that she drinks alcohol. She reports that she does not use illicit drugs.  ROS    Leg cramps: She thinks they are better with stopping pravastatin However still using nonprescription remedies like potassium supplements  and vinegar water She was told to stop her magnesium and potassium supplements on her last visit which were not helping   NEUROPATHY: She has had tingling infeet and legs and also has pain going down her legs especially at night. Lyrica is more beneficial than gabapentin and she is back on it  Diabetic foot exam shows normal monofilament sensation in the toes and plantar surfaces, no skin lesions or ulcers on the feet and normal pedal pulses Diabetic foot exam done in 1/16  Hyperlipidemia:   The lipid abnormality consists of elevated LDL , high triglyceride and Is being treated by PCP with Crestor However she is only taking this twice a week because of cost and fear of muscle cramps She was off all medications and started this back only in early 5/16  Lab Results  Component Value Date   CHOL 226* 07/26/2014   HDL 66.10 07/26/2014   LDLCALC 141* 07/26/2014   LDLDIRECT 153.2 10/27/2012   TRIG 96.0 07/26/2014   CHOLHDL 3 07/26/2014    HYPERTENSION:  Has been present for several years.  On her last visit blood pressure was very poorly controlled with only taking Diovan HCT With her lifestyle changes, weight loss and adding Aldactone blood pressure is much better No new symptoms of orthostatic lightheadedness  Does not monitor at home      Examination:   BP 126/68 mmHg  Pulse 86  Temp(Src) 98 F (36.7 C)  Resp 16  Ht 5\' 6"  (1.676 m)  Wt 163 lb 3.2 oz (74.027 kg)  BMI 26.35 kg/m2  SpO2 97%  Body mass index is 26.35 kg/(m^2).    No ankle edema   Assesment/Plan:   1. LEG cramps  She is symptomatically better and she thinks cramps are because of pravastatin However her hypokalemia has improved with Aldactone.   She can continue to take when either with water which she likes to do for relief  2. DIABETES type 2 with BMI of 27  The patient's diabetes control appears to be much better with her exercise regimen, modifying her diet and less stress Her insulin dose has been  reduced on her own and that sugars recently are fairly good except she has not checked readings after meals in the evening at all recently Fructosamine is minimally increased and she may still have some postprandial hyperglycemia Since she is taking only 1 mg Amaryl twice a day do not think this is probably helping her  She wants to reduce her medications and can stop Amaryl, may need a little increase in her insulin dose however Discussed timing of glucose monitoring as she is not checking readings after her evening meal and not clear if these readings are consistently controlled Advised her to adjust the Lantus only based on fasting readings and not on size of the evening meal   2. Hypertension: Blood pressure is much better controlled with adding Aldactone and potassium is stable  Advised her not to take any potassium  3. Hyperlipidemia: Her lipids will need to be rechecked, has just started back on taking her Crestor Currently taking only 2 tablets a week of 10 mg  4.  Prevnar given at patient's request   Patient Instructions  Please check blood sugars at least half the time about 2-3 hours after any meal and 3 times per week on waking up.  Please bring blood sugar monitor to each visit.  Recommended blood sugar levels about 2 hours after meal is 140-180 and on waking up 90-130  Stop Glimeperide and potassium  May adjust Lantus only if waking up with readings out of above range      Thayer Inabinet 08/29/2014, 12:52 PM   Addendum: Labs show A1c 8.1, glucose 180 and potassium normal To start Aldactone as discussed   Appointment on 08/25/2014  Component Date Value Ref Range Status  . Fructosamine 08/25/2014 297* 0 - 285 umol/L Final   Comment: Published reference interval for apparently healthy subjects between age 67 and 44 is 63 - 285 umol/L and in a poorly controlled diabetic population is 228 - 563 umol/L with a mean of 396 umol/L.   Lab on 08/25/2014  Component Date  Value Ref Range Status  . Sodium 08/25/2014 138  135 - 145 mEq/L Final  . Potassium 08/25/2014 4.3  3.5 - 5.1 mEq/L Final  . Chloride 08/25/2014 101  96 - 112 mEq/L Final  . CO2 08/25/2014 30  19 - 32 mEq/L Final  . Glucose, Bld 08/25/2014 129* 70 - 99 mg/dL Final  . BUN 08/25/2014 20  6 - 23 mg/dL Final  . Creatinine, Ser 08/25/2014 1.21* 0.40 - 1.20 mg/dL Final  . Calcium 08/25/2014 10.0  8.4 - 10.5 mg/dL Final  . GFR 08/25/2014 55.83* >60.00 mL/min Final

## 2014-09-06 DIAGNOSIS — H3561 Retinal hemorrhage, right eye: Secondary | ICD-10-CM | POA: Diagnosis not present

## 2014-09-14 ENCOUNTER — Other Ambulatory Visit: Payer: Self-pay | Admitting: Endocrinology

## 2014-09-20 ENCOUNTER — Other Ambulatory Visit: Payer: Self-pay | Admitting: *Deleted

## 2014-09-20 MED ORDER — INSULIN DETEMIR 100 UNIT/ML FLEXPEN: 8.0000 [IU] | PEN_INJECTOR | Freq: Every day | SUBCUTANEOUS | Status: DC

## 2014-09-27 ENCOUNTER — Ambulatory Visit (INDEPENDENT_AMBULATORY_CARE_PROVIDER_SITE_OTHER): Payer: Medicare Other | Admitting: Neurology

## 2014-09-27 ENCOUNTER — Encounter: Payer: Self-pay | Admitting: Neurology

## 2014-09-27 VITALS — BP 116/72 | HR 76 | Ht 66.0 in | Wt 164.2 lb

## 2014-09-27 DIAGNOSIS — G4762 Sleep related leg cramps: Secondary | ICD-10-CM

## 2014-09-27 DIAGNOSIS — M797 Fibromyalgia: Secondary | ICD-10-CM | POA: Diagnosis not present

## 2014-09-27 HISTORY — DX: Sleep related leg cramps: G47.62

## 2014-09-27 MED ORDER — BACLOFEN 10 MG PO TABS: 10.0000 mg | ORAL_TABLET | Freq: Every day | ORAL | Status: DC

## 2014-09-27 NOTE — Patient Instructions (Signed)

## 2014-09-27 NOTE — Progress Notes (Signed)
Reason for visit: Muscle cramps  Referring physician: Dr. Tretha Sciara is a 74 y.o. female  History of present illness:  [name] is a 74 year old right-handed black female with a history of muscle cramps that she indicates as been present throughout her entire life. The cramps of become more prominent as she has gotten older, and occur usually at nighttime, up to 3 times a week. The patient will have to get up and walk in order to get rid of the cramping. The cramps are usually in the legs, and may involve the foot, calf, or thigh. Occasionally, she may have cramps in the hands or in the chest. She has been using vinegar and mustard as well as magnesium supplementation without complete control of the cramps. She has been on a statin drug for her cholesterol which has worsened the cramps. The patient remains on Crestor currently. The patient denies any numbness of the extremities with exception of some mild numbness of the toes. The patient does have diabetes and fibromyalgia as well as sickle cell trait. The patient's daughter had sickle cell anemia, and she had severe cramps as well. The patient recalls that her grandmother also had muscle cramps. The patient reports that her balance is off, she denies any severe weakness of the extremities. The patient has alternating constipation and diarrhea, denies any issues controlling the bladder. She denies any twitching of the muscles. She is sent to this office for further evaluation.  Past Medical History  Diagnosis Date  . Complication of anesthesia     wakes up shaking  . Diabetes mellitus   . Heart murmur     sees Dr. Montez Morita, last seen- early 2012  . Asthma     has used inhaler in past for asthmatic bronchitis, last time- early 2012  . Sleep apnea     borderline sleep apnea, states she no longer uses, early 2012- stopped using   . GERD (gastroesophageal reflux disease)     occas. use of  Prilosec  . Neuromuscular disorder       lumbar radiculopathy, lumbago  . Fibromyalgia   . Arthritis   . Hypertension     02/2010- stress test /w PCP  . Anemia     has sickle cell trait  . Nocturnal leg cramps 09/27/2014  . Dyslipidemia   . Sickle cell trait     Past Surgical History  Procedure Laterality Date  . Back surgery      2012  . Abdominal hysterectomy    . Adb.cyst      ovarian cyst  . Eye surgery      macular degeneration treatment - injections  . Ovarian cyst surgery      Family History  Problem Relation Age of Onset  . Anesthesia problems Neg Hx   . Hypotension Neg Hx   . Malignant hyperthermia Neg Hx   . Pseudochol deficiency Neg Hx   . Ovarian cancer Mother   . Cancer - Prostate Father   . Breast cancer Paternal Aunt   . Multiple myeloma Paternal Aunt     Social history:  reports that she quit smoking about 15 years ago. She does not have any smokeless tobacco history on file. She reports that she drinks alcohol. She reports that she does not use illicit drugs.  Medications:  Prior to Admission medications   Medication Sig Start Date End Date Taking? Authorizing Provider  alendronate (FOSAMAX) 70 MG tablet Take 70 mg by mouth  once a week. On Sundays 08/27/13  Yes Historical Provider, MD  Ascorbic Acid (VITAMIN C) 100 MG tablet Take 100 mg by mouth daily.   Yes Historical Provider, MD  calcium carbonate (TUMS EX) 750 MG chewable tablet Chew 1 tablet by mouth 2 (two) times daily as needed for heartburn (indigestion).   Yes Historical Provider, MD  cetirizine (ZYRTEC) 10 MG tablet Take 10 mg by mouth daily.   Yes Historical Provider, MD  CRESTOR 10 MG tablet  08/01/14  Yes Historical Provider, MD  Dextromethorphan-Guaifenesin (TUSSIN DM PO) Take 5 mLs by mouth daily as needed (cough).   Yes Historical Provider, MD  fluticasone (FLONASE) 50 MCG/ACT nasal spray Place 1 spray into both nostrils daily.  12/31/12  Yes Historical Provider, MD  gabapentin (NEURONTIN) 300 MG capsule Take 1 capsule (300 mg  total) by mouth 3 (three) times daily. 05/02/14  Yes [name], MD  glimepiride (AMARYL) 2 MG tablet Take 2 mg by mouth 2 (two) times daily. Take 1/2 tablet  every morning  And 1/2 tablet at night   Yes Historical Provider, MD  HUMALOG KWIKPEN 100 UNIT/ML KiwkPen INJECT 6 UNITS UNDER THE SKIN BEFORE BREAKFAST, 10 UNITS BEFORE LUNCH AND 9 UNITS BEFORE SUPPER 04/13/14  Yes [name], MD  ibuprofen (ADVIL,MOTRIN) 800 MG tablet Take 800 mg by mouth 3 (three) times daily as needed (pain).  01/22/13  Yes Historical Provider, MD  Insulin Detemir (LEVEMIR FLEXTOUCH) 100 UNIT/ML Pen Inject 8 Units into the skin daily at 10 pm. 09/20/14  Yes [name], MD  Insulin Pen Needle (BD PEN NEEDLE NANO U/F) 32G X 4 MM MISC Use as directed 12/20/13  Yes [name], MD  LYRICA 50 MG capsule  05/23/14  Yes Historical Provider, MD  Menthol, Topical Analgesic, (BIOFREEZE ROLL-ON EX) Apply 1 application topically daily as needed (pain).   Yes Historical Provider, MD  methocarbamol (ROBAXIN) 500 MG tablet Take 1 tablet (500 mg total) by mouth every 6 (six) hours as needed for muscle spasms. 03/03/14  Yes Kristeen Miss, MD  Multiple Minerals (CALCIUM-MAGNESIUM-ZINC) TABS Take 1 tablet by mouth daily. Calcium 1000 mg, magnesium 400 mg, zinc 25 mg   Yes Historical Provider, MD  Olopatadine HCl 0.6 % SOLN Place into the nose as needed.   Yes Historical Provider, MD  pyridOXINE (VITAMIN B-6) 100 MG tablet Take 100 mg by mouth daily.   Yes Historical Provider, MD  spironolactone (ALDACTONE) 50 MG tablet Take 1 tablet (50 mg total) by mouth daily. 07/26/14  Yes [name], MD  valsartan-hydrochlorothiazide (DIOVAN-HCT) 320-12.5 MG per tablet Take 1 tablet by mouth daily.    Yes Historical Provider, MD  vitamin B-12 (CYANOCOBALAMIN) 100 MCG tablet Take 100 mcg by mouth daily.   Yes Historical Provider, MD  vitamin E 400 UNIT capsule Take 400 Units by mouth daily.   Yes Historical Provider, MD      Allergies  Allergen Reactions  .  Betadine [Povidone Iodine] Swelling    Reaction to betadine eye drops  . Adhesive [Tape] Rash  . Lipitor [Atorvastatin] Rash    ROS:  Out of a complete 14 system review of symptoms, the patient complains only of the following symptoms, and all other reviewed systems are negative.  Chills Fatigue Heart murmur, swelling in the legs Decreased hearing Shortness of breath, cough, wheezing, snoring Incontinence, diarrhea, constipation Urinary incontinence Anemia, easy bruising Joint pain, muscle cramps, aching muscles Allergies, runny nose Memory loss, headache, numbness, weakness, syncope Not enough sleep, decreased  energy Insomnia, snoring, restless legs  Blood pressure 116/72, pulse 76, height _0  (1.676 m), weight 164 lb 3.2 oz (74.481 kg).  Physical Exam  General: The patient is alert and cooperative at the time of the examination. The patient is minimally to moderately obese.  Eyes: Pupils are equal, round, and reactive to light. Discs are flat bilaterally.  Neck: The neck is supple, no carotid bruits are noted.  Respiratory: The respiratory examination is clear.  Cardiovascular: The cardiovascular examination reveals a regular rate and rhythm, no obvious murmurs or rubs are noted.  Skin: Extremities are without significant edema.  Neurologic Exam  Mental status: The patient is alert and oriented x 3 at the time of the examination. The patient has apparent normal recent and remote memory, with an apparently normal attention span and concentration ability.  Cranial nerves: Facial symmetry is present. There is good sensation of the face to pinprick and soft touch bilaterally. The strength of the facial muscles and the muscles to head turning and shoulder shrug are normal bilaterally. Speech is well enunciated, no aphasia or dysarthria is noted. Extraocular movements are full. Visual fields are full. The tongue is midline, and the patient has symmetric elevation of the soft  palate. No obvious hearing deficits are noted.  Motor: The motor testing reveals 5 over 5 strength of all 4 extremities. Good symmetric motor tone is noted throughout.  Sensory: Sensory testing is intact to pinprick, soft touch, vibration sensation, and position sense on all 4 extremities. No evidence of extinction is noted.  Coordination: Cerebellar testing reveals good finger-nose-finger and heel-to-shin bilaterally.  Gait and station: Gait is normal. Tandem gait is normal. Romberg is negative. No drift is seen.  Reflexes: Deep tendon reflexes are symmetric and normal bilaterally. Toes are downgoing bilaterally.   Assessment/Plan:  1. Muscle cramps  2. Fibromyalgia  The patient has a normal clinical examination today. The patient reports a lifelong history of muscle cramps, she does have a history of sickle trait as well as dyslipidemia on statin medications. The cramps have become more frequent on Crestor. The patient is already on magnesium supplementation, we will add baclofen to the regimen. If this is not effective, quinine tablets will be started. The patient will follow-up in 4 months, or sooner if needed.  Julia Alexanders MD 09/27/2014 7:36 PM  [facility] [address] [place], [place] [address]  Phone [phone] Fax [phone]

## 2014-09-28 ENCOUNTER — Telehealth: Payer: Self-pay | Admitting: Neurology

## 2014-09-28 NOTE — Telephone Encounter (Signed)
I called the patient. I advised that she had only had one dose of the baclofen and has not given it enough time to fully work. I suggested she give it a few more days and call back if she still felt this way. She stated she did not like taking the baclofen because she did not like the way it made her legs feel. I asked her to describe how they felt. She stated her thighs were tingling and then said she couldn't really describe how she felt, but it was different and she didn't like it. I told her I would check to see if Dr. Jannifer Franklin had any other suggestions.

## 2014-09-28 NOTE — Telephone Encounter (Signed)
I called patient. The patient indicates that she had a bad day today with muscle cramps. I'm not clear that the baclofen calls this. I would stick with the medication for least a week, if the cramps continue, she will call me, we'll consider switching to quinine tablets.

## 2014-09-28 NOTE — Telephone Encounter (Signed)
Patient called and wanted to let Dr. Jannifer Franklin know that she began the medication Rx. baclofen (LIORESAL) 10 MG tablet last night and the cramps in her leg are worse today. Please call and advise.

## 2014-10-11 DIAGNOSIS — E780 Pure hypercholesterolemia: Secondary | ICD-10-CM | POA: Diagnosis not present

## 2014-10-11 DIAGNOSIS — M810 Age-related osteoporosis without current pathological fracture: Secondary | ICD-10-CM | POA: Diagnosis not present

## 2014-10-11 DIAGNOSIS — R252 Cramp and spasm: Secondary | ICD-10-CM | POA: Diagnosis not present

## 2014-10-11 DIAGNOSIS — D573 Sickle-cell trait: Secondary | ICD-10-CM | POA: Diagnosis not present

## 2014-10-11 DIAGNOSIS — H3530 Unspecified macular degeneration: Secondary | ICD-10-CM | POA: Diagnosis not present

## 2014-10-11 DIAGNOSIS — I10 Essential (primary) hypertension: Secondary | ICD-10-CM | POA: Diagnosis not present

## 2014-10-11 DIAGNOSIS — Z794 Long term (current) use of insulin: Secondary | ICD-10-CM | POA: Diagnosis not present

## 2014-10-11 DIAGNOSIS — M519 Unspecified thoracic, thoracolumbar and lumbosacral intervertebral disc disorder: Secondary | ICD-10-CM | POA: Diagnosis not present

## 2014-10-11 DIAGNOSIS — E1142 Type 2 diabetes mellitus with diabetic polyneuropathy: Secondary | ICD-10-CM | POA: Diagnosis not present

## 2014-10-13 ENCOUNTER — Telehealth: Payer: Self-pay | Admitting: Endocrinology

## 2014-10-13 NOTE — Telephone Encounter (Signed)
Pt needs refill on on humalog but says  The drug name is changing with the insurance

## 2014-10-14 ENCOUNTER — Other Ambulatory Visit (HOSPITAL_COMMUNITY): Payer: Self-pay | Admitting: Cardiology

## 2014-10-14 ENCOUNTER — Other Ambulatory Visit: Payer: Self-pay | Admitting: *Deleted

## 2014-10-14 ENCOUNTER — Ambulatory Visit (HOSPITAL_COMMUNITY): Payer: Medicare Other | Attending: Cardiovascular Disease

## 2014-10-14 ENCOUNTER — Other Ambulatory Visit: Payer: Self-pay

## 2014-10-14 DIAGNOSIS — R011 Cardiac murmur, unspecified: Secondary | ICD-10-CM | POA: Diagnosis not present

## 2014-10-14 MED ORDER — INSULIN LISPRO 100 UNIT/ML (KWIKPEN): PEN_INJECTOR | SUBCUTANEOUS | Status: DC

## 2014-10-14 MED ORDER — INSULIN ASPART 100 UNIT/ML FLEXPEN: PEN_INJECTOR | SUBCUTANEOUS | Status: DC

## 2014-10-14 NOTE — Telephone Encounter (Signed)
rx sent

## 2014-10-26 ENCOUNTER — Other Ambulatory Visit: Payer: Self-pay | Admitting: Endocrinology

## 2014-10-27 ENCOUNTER — Telehealth: Payer: Self-pay | Admitting: *Deleted

## 2014-10-27 ENCOUNTER — Other Ambulatory Visit: Payer: Self-pay | Admitting: *Deleted

## 2014-10-27 ENCOUNTER — Other Ambulatory Visit (INDEPENDENT_AMBULATORY_CARE_PROVIDER_SITE_OTHER): Payer: Medicare Other

## 2014-10-27 DIAGNOSIS — E1340 Other specified diabetes mellitus with diabetic neuropathy, unspecified: Secondary | ICD-10-CM

## 2014-10-27 DIAGNOSIS — IMO0002 Reserved for concepts with insufficient information to code with codable children: Secondary | ICD-10-CM

## 2014-10-27 DIAGNOSIS — E1165 Type 2 diabetes mellitus with hyperglycemia: Secondary | ICD-10-CM | POA: Diagnosis not present

## 2014-10-27 DIAGNOSIS — R42 Dizziness and giddiness: Secondary | ICD-10-CM | POA: Diagnosis not present

## 2014-10-27 DIAGNOSIS — E785 Hyperlipidemia, unspecified: Secondary | ICD-10-CM | POA: Diagnosis not present

## 2014-10-27 LAB — COMPREHENSIVE METABOLIC PANEL
ALT: 10 U/L (ref 0–35)
AST: 14 U/L (ref 0–37)
Albumin: 4 g/dL (ref 3.5–5.2)
Alkaline Phosphatase: 48 U/L (ref 39–117)
BILIRUBIN TOTAL: 0.4 mg/dL (ref 0.2–1.2)
BUN: 20 mg/dL (ref 6–23)
CO2: 25 meq/L (ref 19–32)
CREATININE: 1.16 mg/dL (ref 0.40–1.20)
Calcium: 9.3 mg/dL (ref 8.4–10.5)
Chloride: 105 mEq/L (ref 96–112)
GFR: 58.59 mL/min — AB (ref >60.00)
GLUCOSE: 169 mg/dL — AB (ref 70–99)
Potassium: 4.1 mEq/L (ref 3.5–5.1)
Sodium: 139 mEq/L (ref 135–145)
Total Protein: 6.7 g/dL (ref 6.0–8.3)

## 2014-10-27 LAB — LIPID PANEL
CHOL/HDL RATIO: 6
Cholesterol: 212 mg/dL — ABNORMAL HIGH (ref 0–200)
HDL: 36.2 mg/dL — AB (ref >39.00)
LDL Cholesterol: 144 mg/dL — ABNORMAL HIGH (ref 0–99)
NONHDL: 175.8
TRIGLYCERIDES: 160 mg/dL — AB (ref 0.0–149.0)
VLDL: 32 mg/dL (ref 0.0–40.0)

## 2014-10-27 LAB — HEMOGLOBIN A1C: Hgb A1c MFr Bld: 6.8 % — ABNORMAL HIGH (ref 4.6–6.5)

## 2014-10-27 NOTE — Telephone Encounter (Signed)
Does not appear to be diabetes related issue.  She needs to see her primary care physician for this

## 2014-10-27 NOTE — Telephone Encounter (Signed)
Noted, patient aware

## 2014-10-27 NOTE — Telephone Encounter (Signed)
Patient came in for her labs today, she has had a complaint of Dizziness for several weeks, she's had an Ekg, Echo, sugars are in the 90's.  She has been told to stop her Fosamax to see if that helps,  She had severe leg cramps and was told to stop her crestor, she has stopped taking the valsartan for the last 2 days to see if that would help. She has not gotten any relief from this, and she's getting very frustrated, No one has checked her ears to see if something is going on there and she wants to know if you can prescribe something to help with it? Please advise.

## 2014-10-31 ENCOUNTER — Ambulatory Visit
Admission: RE | Admit: 2014-10-31 | Discharge: 2014-10-31 | Disposition: A | Discharge status: Home / Self Care | Payer: Medicare Other | Source: Ambulatory Visit | Attending: Internal Medicine | Admitting: Internal Medicine

## 2014-10-31 ENCOUNTER — Other Ambulatory Visit: Payer: Self-pay | Admitting: Internal Medicine

## 2014-10-31 DIAGNOSIS — E538 Deficiency of other specified B group vitamins: Secondary | ICD-10-CM | POA: Diagnosis not present

## 2014-10-31 DIAGNOSIS — M5090 Cervical disc disorder, unspecified, unspecified cervical region: Secondary | ICD-10-CM | POA: Diagnosis not present

## 2014-10-31 DIAGNOSIS — Z794 Long term (current) use of insulin: Secondary | ICD-10-CM | POA: Diagnosis not present

## 2014-10-31 DIAGNOSIS — E119 Type 2 diabetes mellitus without complications: Secondary | ICD-10-CM | POA: Diagnosis not present

## 2014-10-31 DIAGNOSIS — M5032 Other cervical disc degeneration, mid-cervical region: Secondary | ICD-10-CM | POA: Diagnosis not present

## 2014-10-31 DIAGNOSIS — M47812 Spondylosis without myelopathy or radiculopathy, cervical region: Secondary | ICD-10-CM | POA: Diagnosis not present

## 2014-10-31 DIAGNOSIS — Z87828 Personal history of other (healed) physical injury and trauma: Secondary | ICD-10-CM | POA: Diagnosis not present

## 2014-10-31 DIAGNOSIS — R42 Dizziness and giddiness: Secondary | ICD-10-CM | POA: Diagnosis not present

## 2014-11-01 ENCOUNTER — Ambulatory Visit (INDEPENDENT_AMBULATORY_CARE_PROVIDER_SITE_OTHER): Payer: Medicare Other | Admitting: Endocrinology

## 2014-11-01 ENCOUNTER — Encounter: Payer: Self-pay | Admitting: Endocrinology

## 2014-11-01 VITALS — BP 124/62 | HR 79 | Temp 98.2°F | Resp 14 | Ht 66.0 in | Wt 162.8 lb

## 2014-11-01 DIAGNOSIS — E1165 Type 2 diabetes mellitus with hyperglycemia: Secondary | ICD-10-CM

## 2014-11-01 DIAGNOSIS — I10 Essential (primary) hypertension: Secondary | ICD-10-CM

## 2014-11-01 DIAGNOSIS — E1140 Type 2 diabetes mellitus with diabetic neuropathy, unspecified: Secondary | ICD-10-CM

## 2014-11-01 DIAGNOSIS — IMO0002 Reserved for concepts with insufficient information to code with codable children: Secondary | ICD-10-CM

## 2014-11-01 DIAGNOSIS — E785 Hyperlipidemia, unspecified: Secondary | ICD-10-CM

## 2014-11-01 DIAGNOSIS — H3532 Exudative age-related macular degeneration: Secondary | ICD-10-CM | POA: Diagnosis not present

## 2014-11-01 MED ORDER — FLUVASTATIN SODIUM 20 MG PO CAPS: 20.0000 mg | ORAL_CAPSULE | Freq: Every day | ORAL | Status: DC

## 2014-11-01 NOTE — Patient Instructions (Addendum)
Check blood sugars on waking up .Marland Kitchen 2-3 .Marland Kitchen times a week Also check blood sugars about 2 hours after a meal and do this after different meals by rotation Recommended blood sugar levels on waking up is 90-130 and about 2 hours after meal is 140-180 Please bring blood sugar monitor to each visit.  Stop Glimeperide in pm  Adjust Lantus every 3 days based on am sugars  Lescol generic every 2 days to start

## 2014-11-01 NOTE — Progress Notes (Signed)
Patient ID: [name], female   DOB: 09-Jan-1941, 74 y.o.   MRN: [id number]    Reason for Appointment: leg cramps and Diabetes follow-up   History of Present Illness    PROBLEM 1:   Problem 2: Type 2 diabetes mellitus, date of diagnosis: 1998.   Prior history: She had previously been treated with Byetta, Glumetza, Amaryl, Victoza and  Onglyza However because of inadequate control and intolerance to drugs she was finally given pre-meal insulin along with Amaryl, Glumetza eventually stopped because of side effects and also Lantus added  Recent history:  The insulin regimen is: Humalog 3-5 at Bfst/lunch---6-8 acs   LEVEMIR insulin 12 units daily hs   Her A1c has been fairly consistent between 7.1-7.4 previously and was markedly increased at 8.1 in 4/16 It is now back to 6.8% She was able to improve her blood sugars with starting regular exercise and cutting back on portions and snacks Also has had less stress However more recently has not been able to exercise much because of dizziness  Current blood sugar patterns and problems with management:  She was told to leave off her Amaryl because of relatively low blood sugars but she is taking this again on her own twice a day  She is checking her blood sugars mostly in the mornings and mid day before lunch and not after meals, not clear which readings are after her meals  Her blood sugars fasting appear to be relatively low at times including this morning when it was 78  Has had one episode of hypoglycemia at lunchtime  May have occasional high readings after meals based on her intake; also yesterday she could not take her insulin for her lunch and glucose was over 200  Has no readings after supper  Not exercising recently  Had previously tried Victoza which caused nausea Hypoglycemia: None recently but did have a low sugar of 61 overnight on 08/06/14  Monitors blood glucose: 1-2 times a day, checking blood sugars mostly before  meals and primarily in the mornings Glucometer: One Touch.   Mean values apply above for all meters except median for One Touch  PRE-MEAL Fasting Lunch  3-7 PM  Bedtime Overall  Glucose range: 78-146  58-192   108, 78     Mean/median:  97      116+/-69    Meals: 2-3 meals per day at 10 am. 2 pm and 6-8 pm but inconsistent schedule.  Will sometimes get protein at breakfast. Her snacks may be popcorn or chips   Physical activity: exercise:  Water aerobics 3/7 days a week, none now  Certified Diabetes Educator visit: Most recent: 7/13.  Dietician visit: Most recent: 3/13.   Wt Readings from Last 3 Encounters:  11/01/14 162 lb 12.8 oz (73.846 kg)  09/27/14 164 lb 3.2 oz (74.481 kg)  08/29/14 163 lb 3.2 oz (74.027 kg)   The microalbumin has been tested, and the result is normal Complications: Neuropathy  LABS:  Lab Results  Component Value Date   HGBA1C 6.8* 10/27/2014   HGBA1C 8.1* 07/26/2014   HGBA1C 7.2* 05/02/2014   Lab Results  Component Value Date   MICROALBUR 4.4* 07/26/2014   LDLCALC 144* 10/27/2014   CREATININE 1.16 10/27/2014        Medication List       This list is accurate as of: 11/01/14  8:44 PM.  Always use your most recent med list.  alendronate 70 MG tablet  Commonly known as:  FOSAMAX  Take 70 mg by mouth once a week. On Sundays     baclofen 10 MG tablet  Commonly known as:  LIORESAL  Take 1 tablet (10 mg total) by mouth at bedtime.     BIOFREEZE ROLL-ON EX  Apply 1 application topically daily as needed (pain).     calcium carbonate 750 MG chewable tablet  Commonly known as:  TUMS EX  Chew 1 tablet by mouth 2 (two) times daily as needed for heartburn (indigestion).     Calcium-Magnesium-Zinc Tabs  Take 1 tablet by mouth daily. Calcium 1000 mg, magnesium 400 mg, zinc 25 mg     cetirizine 10 MG tablet  Commonly known as:  ZYRTEC  Take 10 mg by mouth daily.     CRESTOR 10 MG tablet  Generic drug:  rosuvastatin      fluticasone 50 MCG/ACT nasal spray  Commonly known as:  FLONASE  Place 1 spray into both nostrils daily.     fluvastatin 20 MG capsule  Commonly known as:  LESCOL  Take 1 capsule (20 mg total) by mouth at bedtime.     gabapentin 300 MG capsule  Commonly known as:  NEURONTIN  Take 1 capsule (300 mg total) by mouth 3 (three) times daily.     glimepiride 2 MG tablet  Commonly known as:  AMARYL  Take 2 mg by mouth 2 (two) times daily.     ibuprofen 800 MG tablet  Commonly known as:  ADVIL,MOTRIN  Take 800 mg by mouth 3 (three) times daily as needed (pain).     insulin aspart 100 UNIT/ML FlexPen  Commonly known as:  NOVOLOG FLEXPEN  Inject 6 units before breakfast, 10 units before lunch and 9 units before supper     Insulin Detemir 100 UNIT/ML Pen  Commonly known as:  LEVEMIR FLEXTOUCH  Inject 8 Units into the skin daily at 10 pm.     Insulin Pen Needle 32G X 4 MM Misc  Commonly known as:  BD PEN NEEDLE NANO U/F  Use as directed     LYRICA 50 MG capsule  Generic drug:  pregabalin     methocarbamol 500 MG tablet  Commonly known as:  ROBAXIN  Take 1 tablet (500 mg total) by mouth every 6 (six) hours as needed for muscle spasms.     Olopatadine HCl 0.6 % Soln  Place into the nose as needed.     pyridOXINE 100 MG tablet  Commonly known as:  VITAMIN B-6  Take 100 mg by mouth daily.     spironolactone 50 MG tablet  Commonly known as:  ALDACTONE  TAKE 1 TABLET(50 MG) BY MOUTH DAILY     TUSSIN DM PO  Take 5 mLs by mouth daily as needed (cough).     valsartan-hydrochlorothiazide 320-12.5 MG per tablet  Commonly known as:  DIOVAN-HCT  Take 1 tablet by mouth daily.     vitamin B-12 100 MCG tablet  Commonly known as:  CYANOCOBALAMIN  Take 100 mcg by mouth daily.     vitamin C 100 MG tablet  Take 100 mg by mouth daily.     vitamin E 400 UNIT capsule  Take 400 Units by mouth daily.        Allergies:  Allergies  Allergen Reactions  . Betadine [Povidone Iodine]  Swelling    Reaction to betadine eye drops  . Adhesive [Tape] Rash  . Lipitor [Atorvastatin] Rash  Past Medical History  Diagnosis Date  . Complication of anesthesia     wakes up shaking  . Diabetes mellitus   . Heart murmur     sees Dr. Montez Morita, last seen- early 2012  . Asthma     has used inhaler in past for asthmatic bronchitis, last time- early 2012  . Sleep apnea     borderline sleep apnea, states she no longer uses, early 2012- stopped using   . GERD (gastroesophageal reflux disease)     occas. use of  Prilosec  . Neuromuscular disorder     lumbar radiculopathy, lumbago  . Fibromyalgia   . Arthritis   . Hypertension     02/2010- stress test /w PCP  . Anemia     has sickle cell trait  . Nocturnal leg cramps 09/27/2014  . Dyslipidemia   . Sickle cell trait     Past Surgical History  Procedure Laterality Date  . Back surgery      2012  . Abdominal hysterectomy    . Adb.cyst      ovarian cyst  . Eye surgery      macular degeneration treatment - injections  . Ovarian cyst surgery      Family History  Problem Relation Age of Onset  . Anesthesia problems Neg Hx   . Hypotension Neg Hx   . Malignant hyperthermia Neg Hx   . Pseudochol deficiency Neg Hx   . Ovarian cancer Mother   . Cancer - Prostate Father   . Breast cancer Paternal Aunt   . Multiple myeloma Paternal Aunt     Social History:  reports that she quit smoking about 15 years ago. She does not have any smokeless tobacco history on file. She reports that she drinks alcohol. She reports that she does not use illicit drugs.  ROS    Leg cramps: She thinks they are better with stopping Crestor and also previously pravastatin She was told to stop Crestor by the neurologist However still using nonprescription remedies like potassium supplements and vinegar water She was told to stop her magnesium and potassium supplements on her last visit which were not helping   NEUROPATHY: She has had tingling  infeet and legs and also has pain going down her legs especially at night. Lyrica is more beneficial than gabapentin and she is back on it  Diabetic foot exam shows normal monofilament sensation in the toes and plantar surfaces, no skin lesions or ulcers on the feet and normal pedal pulses Diabetic foot exam done in 1/16  Hyperlipidemia:   The lipid abnormality consists of elevated LDL , high triglyceride and Is off  Crestor reportedly because of having muscle cramps A few years ago she had been taking Lescol XL without any side effects LDL is increase as expected  Lab Results  Component Value Date   CHOL 212* 10/27/2014   HDL 36.20* 10/27/2014   LDLCALC 144* 10/27/2014   LDLDIRECT 153.2 10/27/2012   TRIG 160.0* 10/27/2014   CHOLHDL 6 10/27/2014    HYPERTENSION:  Has been present for several years. Recently controlled with taking Diovan HCT as well as adding Aldactone blood pressure  No new symptoms of orthostatic lightheadedness Does not monitor at home      Examination:   BP 124/62 mmHg  Pulse 79  Temp(Src) 98.2 F (36.8 C)  Resp 14  Ht '5\' 6"'  (1.676 m)  Wt 162 lb 12.8 oz (73.846 kg)  BMI 26.29 kg/m2  SpO2 96%  Body mass  index is 26.29 kg/(m^2).    No ankle edema   Assesment/Plan:   1.  Hyperlipidemia: Her lipids are not controlled without any medications and she is not on any statins because of fear of muscle cramps.  As discussed above she had tolerated Lescol previously without any side effects and will try this again at a low dose of 20 mg  2. DIABETES type 2 with BMI of 26  The patient's diabetes control appears to be much better with A1c now 6.8 See history of present illness for detailed discussion of his current management, blood sugar patterns and problems identified  She has improved with modifying her diet and less stress Although recently has not been able to exercise her blood sugars appear to be fairly good; however she has been checking blood  sugars mostly at breakfast and lunch Recommendations: Basal insulin: She is requiring somewhat more with Levemir compared to Lantus but blood sugars are fairly good and will continue the 12 units she is taking Because of low normal sugars in the mornings have advised her to stop Amaryl in the evening She will need to check more readings after supper which she is not doing Resume exercise when able to Adjust mealtime doses based on her carbohydrate intake and pre-meal blood sugar   3. Hypertension: Blood pressure is  better controlled with adding Aldactone and potassium is stable  She will also continue Diovan HCT  4.  Dizziness: She will discuss this with neurologist, not clear if this is vertigo   Patient Instructions  Check blood sugars on waking up .Marland Kitchen 2-3 .Marland Kitchen times a week Also check blood sugars about 2 hours after a meal and do this after different meals by rotation Recommended blood sugar levels on waking up is 90-130 and about 2 hours after meal is 140-180 Please bring blood sugar monitor to each visit.  Stop Glimeperide in pm  Adjust Lantus every 3 days based on am sugars  Lescol generic every 2 days to start   Counseling time on subjects discussed above is over 50% of today's 25 minute visit   [name] 11/01/2014, 8:44 PM      Lab on 10/27/2014  Component Date Value Ref Range Status  . Hgb A1c MFr Bld 10/27/2014 6.8* 4.6 - 6.5 % Final   Glycemic Control Guidelines for People with Diabetes:Non Diabetic:  <6%Goal of Therapy: <7%Additional Action Suggested:  >8%   . Sodium 10/27/2014 139  135 - 145 mEq/L Final  . Potassium 10/27/2014 4.1  3.5 - 5.1 mEq/L Final  . Chloride 10/27/2014 105  96 - 112 mEq/L Final  . CO2 10/27/2014 25  19 - 32 mEq/L Final  . Glucose, Bld 10/27/2014 169* 70 - 99 mg/dL Final  . BUN 10/27/2014 20  6 - 23 mg/dL Final  . Creatinine, Ser 10/27/2014 1.16  0.40 - 1.20 mg/dL Final  . Total Bilirubin 10/27/2014 0.4  0.2 - 1.2 mg/dL Final  .  Alkaline Phosphatase 10/27/2014 48  39 - 117 U/L Final  . AST 10/27/2014 14  0 - 37 U/L Final  . ALT 10/27/2014 10  0 - 35 U/L Final  . Total Protein 10/27/2014 6.7  6.0 - 8.3 g/dL Final  . Albumin 10/27/2014 4.0  3.5 - 5.2 g/dL Final  . Calcium 10/27/2014 9.3  8.4 - 10.5 mg/dL Final  . GFR 10/27/2014 58.59* >60.00 mL/min Final  . Cholesterol 10/27/2014 212* 0 - 200 mg/dL Final   ATP III Classification  Desirable:  < 200 mg/dL               Borderline High:  200 - 239 mg/dL          High:  > = 240 mg/dL  . Triglycerides 10/27/2014 160.0* 0.0 - 149.0 mg/dL Final   Normal:  <150 mg/dLBorderline High:  150 - 199 mg/dL  . HDL 10/27/2014 36.20* >39.00 mg/dL Final  . VLDL 10/27/2014 32.0  0.0 - 40.0 mg/dL Final  . LDL Cholesterol 10/27/2014 144* 0 - 99 mg/dL Final  . Total CHOL/HDL Ratio 10/27/2014 6   Final                  Men          Women1/2 Average Risk     3.4          3.3Average Risk          5.0          4.42X Average Risk          9.6          7.13X Average Risk          15.0          11.0                      . NonHDL 10/27/2014 175.80   Final   NOTE:  Non-HDL goal should be 30 mg/dL higher than patient's LDL goal (i.e. LDL goal of < 70 mg/dL, would have non-HDL goal of < 100 mg/dL)

## 2014-12-02 ENCOUNTER — Ambulatory Visit: Payer: Medicare Other | Admitting: Rehabilitative and Restorative Service Providers"

## 2014-12-05 ENCOUNTER — Ambulatory Visit: Payer: Medicare Other | Attending: Internal Medicine | Admitting: Rehabilitative and Restorative Service Providers"

## 2014-12-05 ENCOUNTER — Encounter: Payer: Self-pay | Admitting: Rehabilitative and Restorative Service Providers"

## 2014-12-05 DIAGNOSIS — R269 Unspecified abnormalities of gait and mobility: Secondary | ICD-10-CM | POA: Diagnosis not present

## 2014-12-05 DIAGNOSIS — R42 Dizziness and giddiness: Secondary | ICD-10-CM

## 2014-12-05 DIAGNOSIS — M542 Cervicalgia: Secondary | ICD-10-CM | POA: Diagnosis not present

## 2014-12-05 NOTE — Patient Instructions (Signed)
Thoracic Self-Mobilization (Supine)   With rolled towel placed lengthwise at lower ribs level, lie back on towel with arms outstretched. Hold _2 minutes. Relax. Repeat _1___ time. Do __1-2__ sessions per day.  http://orth.exer.us/1001   Copyright  VHI. All rights reserved.  Healthy Back - Shoulder Roll   Stand straight with arms relaxed at sides. Roll shoulders backward continuously. Do __10__ times.  Copyright  VHI. All rights reserved.   Gaze Stabilization: Tip Card 1.Target must remain in focus, not blurry, and appear stationary while head is in motion. 2.Perform exercises with small head movements (45 to either side of midline). 3.Increase speed of head motion so long as target is in focus. 4.If you wear eyeglasses, be sure you can see target through lens (therapist will give specific instructions for bifocal / progressive lenses). 5.These exercises may provoke dizziness or nausea. Work through these symptoms. If too dizzy, slow head movement slightly. Rest between each exercise. 6.Exercises demand concentration; avoid distractions. 7.For safety, perform standing exercises close to a counter, wall, corner, or next to someone.  Copyright  VHI. All rights reserved.  Gaze Stabilization: Sitting   Keeping eyes on target on wall 3 feet away, tilt head down slightly and move head side to side for __30__ seconds.  Do _2___ sessions per day.  Copyright  VHI. All rights reserved.

## 2014-12-05 NOTE — Therapy (Signed)
[facility] [address] [place], [place], [address] Phone: [phone]   Fax:  [phone]  Physical Therapy Evaluation  Patient Details  Name: [name] MRN: [id number] Date of Birth: 01/29/1941 Referring Provider:  Wenda Low, MD  Encounter Date: 12/05/2014      PT End of Session - 12/05/14 1443    Visit Number 1   Number of Visits 8   Date for PT Re-Evaluation 01/19/15   Authorization Type G code every 10th visit   PT Start Time 1320   PT Stop Time 1405   PT Time Calculation (min) 45 min   Activity Tolerance Patient tolerated treatment well   Behavior During Therapy [facility] for tasks assessed/performed      Past Medical History  Diagnosis Date  . Complication of anesthesia     wakes up shaking  . Diabetes mellitus   . Heart murmur     sees Dr. Montez Morita, last seen- early 2012  . Asthma     has used inhaler in past for asthmatic bronchitis, last time- early 2012  . Sleep apnea     borderline sleep apnea, states she no longer uses, early 2012- stopped using   . GERD (gastroesophageal reflux disease)     occas. use of  Prilosec  . Neuromuscular disorder     lumbar radiculopathy, lumbago  . Fibromyalgia   . Arthritis   . Hypertension     02/2010- stress test /w PCP  . Anemia     has sickle cell trait  . Nocturnal leg cramps 09/27/2014  . Dyslipidemia   . Sickle cell trait     Past Surgical History  Procedure Laterality Date  . Back surgery      2012, 2015 (3 total)  . Abdominal hysterectomy    . Adb.cyst      ovarian cyst  . Eye surgery      macular degeneration treatment - injections  . Ovarian cyst surgery      There were no vitals filed for this visit.  Visit Diagnosis:  Abnormality of gait  Neck pain  Dizziness and giddiness      Subjective Assessment - 12/05/14 1325    Subjective The patient reports gradual onset of dizziness 3-4 months ago.  She was seen by her MD and  provided meclizine, without any relief in symptoms.  She also notes with diabetes she gets lightheaded when she doesn't eat well.  "it is hard to tell where it's coming from".  Her main issue is that 2 years ago she fell and "my head has not been right since I fell".  She reports brain scan WNLs.  She reports symptoms pain that radiates from the left side of her neck all the way down the arm extending to the fingers.    Patient Stated Goals helping with pain and dizziness   Currently in Pain? Yes   Pain Score 5    Pain Location Neck   Pain Orientation Left   Pain Descriptors / Indicators Aching;Discomfort   Pain Radiating Towards L arm into hand   Pain Onset More than a month ago   Pain Frequency Intermittent  varies in intensity   Aggravating Factors  worse in morning, and with air conditioner   Pain Relieving Factors improved with pool class and movement            [facility] PT Assessment - 12/05/14 1333    Assessment   Medical Diagnosis dizziness, cervical DJD  Onset Date/Surgical Date --   4 months ago for dizziness/ fall 2 years ago   Precautions   Precautions Fall   Balance Screen   Has the patient fallen in the past 6 months Yes   How many times? --  fell in a parking lot while wearing flip flops   Has the patient had a decrease in activity level because of a fear of falling?  No   Is the patient reluctant to leave their home because of a fear of falling?  No   Home Ecologist residence   Prior Function   Leisure does pool therapy 2 days a week for arthritis class/ walks dog   ROM / Strength   AROM / PROM / Strength AROM   AROM   Cervical Flexion WFLs   Cervical Extension WFLs   Cervical - Right Side Bend WFLs   Cervical - Left Side Bend WFLs   Cervical - Right Rotation WFLs   Cervical - Left Rotation WFLs   Palpation   Palpation comment tender on L side for scalenes, L upper trapezius            Vestibular Assessment - 12/05/14  1334    Symptom Behavior   Type of Dizziness --  "never like vertigo", "it's me that gets dizzy", unsteady   Frequency of Dizziness "it doesn't stay all day" sporadic   Duration of Dizziness varies, b/c blood sugar impacts lightheaded/dizzy sensation   Aggravating Factors No known aggravating factors   Relieving Factors No known relieving factors   Occulomotor Exam   Occulomotor Alignment Normal   Spontaneous Absent   Gaze-induced Absent   Smooth Pursuits Intact   Comment macular degeneration R eye   Vestibulo-Occular Reflex   VOR 1 Head Only (x 1 viewing) provokes sensation of dizziness in her head with horizontal movement   Positional Testing   Sidelying Test Sidelying Right;Sidelying Left   Horizontal Canal Testing --   Sidelying Right   Sidelying Right Duration lightheaded with return to sitting   Sidelying Right Symptoms No nystagmus   Sidelying Left   Sidelying Left Duration ligthheaded with return to sitting   Sidelying Left Symptoms No nystagmus    Single leg stance 3 seconds R and L sides Dizziness handicap index=32%.   THERAPEUTIC EXERCISE: HEP provided today per PT education      PT Education - 12/05/14 1435    Education provided Yes   Education Details HEP: gaze x 1 viewing, towel roll stretch, shoulder circles   Person(s) Educated Patient   Methods Explanation;Demonstration;Handout   Comprehension Returned demonstration;Verbalized understanding           Plan - 12/05/14 1414    Clinical Impression Statement The patient is a 74 yo female presenting to physical therapy with h/o a fall 2 years ago with patient reporting "head doesn't feel right", h/o neck pain, and then recent onset of dizziness.  She describes multiple factors that contribute to a sensation of dizziness reporting blood sugar, getting up, and feeing unsteady all provide a sensation of "dizziness".  She denies sensation of room spinning or true vertigo.  PT to treat neck, posture, balance, and  vestibular issues as needed to optimize current functional status.   Pt will benefit from skilled therapeutic intervention in order to improve on the following deficits Abnormal gait;Dizziness;Pain;Decreased mobility;Postural dysfunction;Decreased balance   Rehab Potential Good   PT Frequency 2x / week   PT Duration 6 weeks  decreasing  to 1x/week as able.   PT Treatment/Interventions Manual techniques;Canalith Repostioning;Vestibular;Patient/family education;Functional mobility training;Therapeutic activities;Therapeutic exercise;Neuromuscular re-education;Gait training;Traction   PT Next Visit Plan Review HEP from evaluation, manual soft tissue mobilization focusing on L cervical region + suboccipitals, progress gaze x 1 to standing, balance HEP (single limb stance, tandem in corner)   Consulted and Agree with Plan of Care Patient          G-Codes - 26-Dec-2014 1447    Functional Assessment Tool Used Dizziness handicap index=32%, neck pain 5/10   Functional Limitation Self care   Self Care Current Status (I9678) At least 20 percent but less than 40 percent impaired, limited or restricted   Self Care Goal Status (L3810) At least 1 percent but less than 20 percent impaired, limited or restricted       Problem List Patient Active Problem List   Diagnosis Date Noted  . Fibromyalgia 09/27/2014  . Nocturnal leg cramps 09/27/2014  . Neuropathy 03/10/2014  . Allergic rhinitis 03/10/2014  . Osteoporosis 03/10/2014  . Spinal stenosis of lumbar region with radiculopathy 02/28/2014  . Type II or unspecified type diabetes mellitus without mention of complication, uncontrolled 07/08/2013  . Hypokalemia 11/02/2012  . Essential hypertension, benign 11/02/2012  . Diabetes mellitus with neuropathy 10/29/2012  . Hyperlipidemia 10/29/2012  . Spondylolisthesis of lumbar region 03/19/2011  . Lumbar radicular pain 03/19/2011    Lynley Killilea, PT 26-Dec-2014, 2:59 PM  [facility] [address] [place], [place], [address] Phone: [phone]   Fax:  [phone]

## 2014-12-22 ENCOUNTER — Ambulatory Visit: Payer: Medicare Other | Attending: Internal Medicine | Admitting: Rehabilitative and Restorative Service Providers"

## 2014-12-22 DIAGNOSIS — R269 Unspecified abnormalities of gait and mobility: Secondary | ICD-10-CM | POA: Insufficient documentation

## 2014-12-22 DIAGNOSIS — R42 Dizziness and giddiness: Secondary | ICD-10-CM | POA: Diagnosis not present

## 2014-12-22 DIAGNOSIS — M542 Cervicalgia: Secondary | ICD-10-CM

## 2014-12-22 NOTE — Patient Instructions (Addendum)
*  continue shoulder rolls and towel stretch from first set of exercises  Axial Extension (Chin Tuck)   Can do lying down with one pillow or seated.  Pull chin in and lengthen back of neck. Hold _5___ seconds while counting out loud. Repeat _10___ times. Do __1__ sessions per day.  http://gt2.exer.us/450   Copyright  VHI. All rights reserved.  Gaze Stabilization: Standing Feet Apart   Feet shoulder width apart, keeping eyes on target on wall __3__ feet away, tilt head down slightly and move head side to side for _30___ seconds. Repeat while moving head up and down for ___30_ seconds. Do _2___ sessions per day.  *do not stop in the middle of movement*  Copyright  VHI. All rights reserved.  Scapular Retraction: Abduction (Standing)   With arms elevated and elbows bent to 90, pinch shoulder blades together and press arms back. Repeat __10__ times per set. Do _1___ sets per session. Do _1___ sessions per day.  http://orth.exer.us/951   Copyright  VHI. All rights reserved.

## 2014-12-22 NOTE — Therapy (Signed)
[facility] [address] [place], [place], [address] Phone: [phone]   Fax:  [phone]  Physical Therapy Treatment  Patient Details  Name: [name] MRN: [id number] Date of Birth: [birth year] Referring Provider:  Wallene Huh, MD  Encounter Date: 12/22/2014      PT End of Session - 12/22/14 1029    Visit Number 2   Number of Visits 8   Date for PT Re-Evaluation 01/19/15   Authorization Type G code every 10th visit   PT Start Time 1022   PT Stop Time 1102   PT Time Calculation (min) 40 min   Activity Tolerance Patient tolerated treatment well   Behavior During Therapy [facility] for tasks assessed/performed      Past Medical History  Diagnosis Date  . Complication of anesthesia     wakes up shaking  . Diabetes mellitus   . Heart murmur     sees Dr. Montez Morita, last seen- early 2012  . Asthma     has used inhaler in past for asthmatic bronchitis, last time- early 2012  . Sleep apnea     borderline sleep apnea, states she no longer uses, early 2012- stopped using   . GERD (gastroesophageal reflux disease)     occas. use of  Prilosec  . Neuromuscular disorder     lumbar radiculopathy, lumbago  . Fibromyalgia   . Arthritis   . Hypertension     02/2010- stress test /w PCP  . Anemia     has sickle cell trait  . Nocturnal leg cramps 09/27/2014  . Dyslipidemia   . Sickle cell trait     Past Surgical History  Procedure Laterality Date  . Back surgery      2012, 2015 (3 total)  . Abdominal hysterectomy    . Adb.cyst      ovarian cyst  . Eye surgery      macular degeneration treatment - injections  . Ovarian cyst surgery      There were no vitals filed for this visit.  Visit Diagnosis:  Dizziness and giddiness  Abnormality of gait  Neck pain      Subjective Assessment - 12/22/14 1024    Subjective The patient reports she is stiff and sore this morning.  Her blood sugar has been running high.   She is doing gaze adaptation exercises, but not spending as much time with posture stretching.  "I'm wheezing a lot this morning."   Patient Stated Goals helping with pain and dizziness   Currently in Pain? Yes   Pain Score 7   forgots meds this morning - takes 856m ibuprofen each morning   Pain Location Leg  sore from water aerobics   Pain Descriptors / Indicators Sore   Pain Onset Yesterday   Pain Frequency Intermittent   Aggravating Factors  worse in the morning   Pain Relieving Factors improved with pool class                 [facility] - 12/22/14 1037    Exercises   Exercises Neck;Shoulder   Neck Exercises: Standing   Other Standing Exercises "w" exercise with scapular retraction x 10 reps for HEP   Neck Exercises: Seated   Neck Retraction 5 reps  with cues on posture *pt needs cues in seated for technique   Other Seated Exercise shoulder circles anterior/poosteriorx 10 reps   Neck Exercises: Supine   Neck Retraction 10 reps  supine with manual  resistance   Other Supine Exercise towel roll supine x 3 minutes             Balance Exercises - 12/22/14 1300    Balance Exercises: Standing   Tandem Stance --  in corner with instructions for HEP   SLS --  working up to 10 seconds trying to decrease UE support       SELF CARE/HOME MANAGEMENT: Discussed community exercise program and adding home walking for general fitness.  Paitent currently works out at [employer] 1-3 days/week depending on her schedule.        PT Education - 12/22/14 1255    Education provided Yes   Education Details HEP: neck retraction, scapular retraction, gaze x 1 in standing   Person(s) Educated Patient   Methods Explanation;Demonstration;Handout   Comprehension Verbalized understanding;Returned demonstration          PT Short Term Goals - 12/22/14 1256    PT SHORT TERM GOAL #1   Title The patient will be indep with HEP for cervical and postural  stretching.   Baseline Met on 12/22/2014   Time 4   Period Weeks   Status Achieved   PT SHORT TERM GOAL #2   Title The patient will tolerate gaze x 1 viewing x 30 seconds nonstop.   Baseline Met on 12/22/2014   Time 4   Period Weeks   Status Achieved   PT SHORT TERM GOAL #3   Title The patient will improve single limb stance from 3 seconds bilat to > or equal to 5 seconds.   Baseline Target date 01/05/2015   Time 4   Period Weeks   Status On-going   PT SHORT TERM GOAL #4   Title The patient will report neck pain < or equal to 3/10 (from baseline of 5/10).   Baseline Target date 01/05/2015   Time 4   Period Weeks   Status On-going           PT Long Term Goals - 12/05/14 1458    PT LONG TERM GOAL #1   Title The patient will improve DHI from 32% to < or equal to 20%.   Baseline Target date 01/19/2015   Time 6   Period Weeks   PT LONG TERM GOAL #2   Title The patient will be indep with HEP for post d/c progression.   Baseline Target date 01/19/2015   Time 6   Period Weeks   PT LONG TERM GOAL #3   Title The patient will verbalize 3 strategies to reduce neck and L UE pain for mgmt of chronic neck issues.   Baseline Target date 01/19/2015   Time 6   Period Weeks               Plan - 12/22/14 1257    Clinical Impression Statement The patient is performing aquatics through community activities and is progressing activity level well.  PT added specific balance and postural stabilization activities to HEP.   PT Next Visit Plan Review HEP, soft tissue as needed L cervical region, check STGs.    Consulted and Agree with Plan of Care Patient        Problem List Patient Active Problem List   Diagnosis Date Noted  . Fibromyalgia 09/27/2014  . Nocturnal leg cramps 09/27/2014  . Neuropathy 03/10/2014  . Allergic rhinitis 03/10/2014  . Osteoporosis 03/10/2014  . Spinal stenosis of lumbar region with radiculopathy 02/28/2014  . Type II or unspecified type diabetes mellitus  without mention of complication, uncontrolled 07/08/2013  . Hypokalemia 11/02/2012  . Essential hypertension, benign 11/02/2012  . Diabetes mellitus with neuropathy 10/29/2012  . Hyperlipidemia 10/29/2012  . Spondylolisthesis of lumbar region 03/19/2011  . Lumbar radicular pain 03/19/2011    Perrin Gens, PT 12/22/2014, 1:02 PM  [facility] [address] [place], [place], [address] Phone: [phone]   Fax:  [phone]

## 2014-12-23 ENCOUNTER — Ambulatory Visit: Payer: Medicare Other | Admitting: Rehabilitative and Restorative Service Providers"

## 2014-12-27 DIAGNOSIS — H3532 Exudative age-related macular degeneration: Secondary | ICD-10-CM | POA: Diagnosis not present

## 2014-12-30 ENCOUNTER — Ambulatory Visit: Payer: Medicare Other | Admitting: Rehabilitative and Restorative Service Providers"

## 2015-01-02 ENCOUNTER — Encounter: Payer: Medicare Other | Admitting: Rehabilitative and Restorative Service Providers"

## 2015-01-05 ENCOUNTER — Encounter: Payer: Medicare Other | Admitting: Rehabilitative and Restorative Service Providers"

## 2015-01-10 ENCOUNTER — Other Ambulatory Visit: Payer: Self-pay | Admitting: Neurology

## 2015-01-11 ENCOUNTER — Ambulatory Visit: Payer: Medicare Other | Admitting: Rehabilitative and Restorative Service Providers"

## 2015-01-13 ENCOUNTER — Other Ambulatory Visit: Payer: Self-pay | Admitting: Endocrinology

## 2015-01-13 ENCOUNTER — Ambulatory Visit: Payer: Medicare Other | Admitting: Rehabilitative and Restorative Service Providers"

## 2015-01-13 DIAGNOSIS — J4531 Mild persistent asthma with (acute) exacerbation: Secondary | ICD-10-CM | POA: Diagnosis not present

## 2015-01-13 DIAGNOSIS — J3089 Other allergic rhinitis: Secondary | ICD-10-CM | POA: Diagnosis not present

## 2015-01-13 DIAGNOSIS — J301 Allergic rhinitis due to pollen: Secondary | ICD-10-CM | POA: Diagnosis not present

## 2015-01-13 NOTE — Telephone Encounter (Signed)
Patient stated that her pharmacy sent over a request for gabapentin two days ago, and haven't heard anything from you yet.

## 2015-01-16 ENCOUNTER — Other Ambulatory Visit: Payer: Self-pay | Admitting: Internal Medicine

## 2015-01-16 DIAGNOSIS — I10 Essential (primary) hypertension: Secondary | ICD-10-CM | POA: Diagnosis not present

## 2015-01-16 DIAGNOSIS — E1142 Type 2 diabetes mellitus with diabetic polyneuropathy: Secondary | ICD-10-CM | POA: Diagnosis not present

## 2015-01-16 DIAGNOSIS — E780 Pure hypercholesterolemia: Secondary | ICD-10-CM | POA: Diagnosis not present

## 2015-01-16 DIAGNOSIS — M519 Unspecified thoracic, thoracolumbar and lumbosacral intervertebral disc disorder: Secondary | ICD-10-CM | POA: Diagnosis not present

## 2015-01-16 DIAGNOSIS — R1310 Dysphagia, unspecified: Secondary | ICD-10-CM

## 2015-01-16 DIAGNOSIS — Z794 Long term (current) use of insulin: Secondary | ICD-10-CM | POA: Diagnosis not present

## 2015-01-16 DIAGNOSIS — Z1389 Encounter for screening for other disorder: Secondary | ICD-10-CM | POA: Diagnosis not present

## 2015-01-16 DIAGNOSIS — Z0000 Encounter for general adult medical examination without abnormal findings: Secondary | ICD-10-CM | POA: Diagnosis not present

## 2015-01-16 DIAGNOSIS — D573 Sickle-cell trait: Secondary | ICD-10-CM | POA: Diagnosis not present

## 2015-01-16 DIAGNOSIS — M810 Age-related osteoporosis without current pathological fracture: Secondary | ICD-10-CM | POA: Diagnosis not present

## 2015-01-16 DIAGNOSIS — R05 Cough: Secondary | ICD-10-CM | POA: Diagnosis not present

## 2015-01-16 DIAGNOSIS — Z23 Encounter for immunization: Secondary | ICD-10-CM | POA: Diagnosis not present

## 2015-01-23 ENCOUNTER — Other Ambulatory Visit: Payer: Self-pay | Admitting: *Deleted

## 2015-01-23 MED ORDER — INSULIN PEN NEEDLE 32G X 4 MM MISC: Status: DC

## 2015-01-26 ENCOUNTER — Other Ambulatory Visit: Payer: Medicare Other

## 2015-01-26 ENCOUNTER — Other Ambulatory Visit (INDEPENDENT_AMBULATORY_CARE_PROVIDER_SITE_OTHER): Payer: Medicare Other

## 2015-01-26 DIAGNOSIS — E785 Hyperlipidemia, unspecified: Secondary | ICD-10-CM

## 2015-01-26 DIAGNOSIS — E1340 Other specified diabetes mellitus with diabetic neuropathy, unspecified: Secondary | ICD-10-CM | POA: Diagnosis not present

## 2015-01-26 DIAGNOSIS — E1165 Type 2 diabetes mellitus with hyperglycemia: Secondary | ICD-10-CM

## 2015-01-26 DIAGNOSIS — IMO0002 Reserved for concepts with insufficient information to code with codable children: Secondary | ICD-10-CM

## 2015-01-26 LAB — COMPREHENSIVE METABOLIC PANEL
ALT: 11 U/L (ref 0–35)
AST: 15 U/L (ref 0–37)
Albumin: 4.1 g/dL (ref 3.5–5.2)
Alkaline Phosphatase: 59 U/L (ref 39–117)
BUN: 19 mg/dL (ref 6–23)
CALCIUM: 9.5 mg/dL (ref 8.4–10.5)
CO2: 30 mEq/L (ref 19–32)
Chloride: 103 mEq/L (ref 96–112)
Creatinine, Ser: 0.99 mg/dL (ref 0.40–1.20)
GFR: 70.3 mL/min (ref >60.00)
GLUCOSE: 96 mg/dL (ref 70–99)
POTASSIUM: 3.6 meq/L (ref 3.5–5.1)
Sodium: 141 mEq/L (ref 135–145)
TOTAL PROTEIN: 6.8 g/dL (ref 6.0–8.3)
Total Bilirubin: 0.4 mg/dL (ref 0.2–1.2)

## 2015-01-26 LAB — LIPID PANEL
Cholesterol: 225 mg/dL — ABNORMAL HIGH (ref 0–200)
HDL: 61.6 mg/dL (ref >39.00)
LDL Cholesterol: 147 mg/dL — ABNORMAL HIGH (ref 0–99)
NonHDL: 163.29
Total CHOL/HDL Ratio: 4
Triglycerides: 82 mg/dL (ref 0.0–149.0)
VLDL: 16.4 mg/dL (ref 0.0–40.0)

## 2015-01-26 LAB — HEMOGLOBIN A1C: HEMOGLOBIN A1C: 7.2 % — AB (ref 4.6–6.5)

## 2015-01-26 LAB — TSH: TSH: 5.69 u[IU]/mL — AB (ref 0.35–4.50)

## 2015-01-31 ENCOUNTER — Ambulatory Visit (INDEPENDENT_AMBULATORY_CARE_PROVIDER_SITE_OTHER): Payer: Medicare Other | Admitting: Endocrinology

## 2015-01-31 ENCOUNTER — Encounter: Payer: Self-pay | Admitting: Endocrinology

## 2015-01-31 VITALS — BP 124/68 | HR 79 | Temp 98.1°F | Resp 16 | Ht 66.0 in | Wt 168.2 lb

## 2015-01-31 DIAGNOSIS — E11649 Type 2 diabetes mellitus with hypoglycemia without coma: Secondary | ICD-10-CM

## 2015-01-31 DIAGNOSIS — E1140 Type 2 diabetes mellitus with diabetic neuropathy, unspecified: Secondary | ICD-10-CM

## 2015-01-31 DIAGNOSIS — Z794 Long term (current) use of insulin: Secondary | ICD-10-CM

## 2015-01-31 DIAGNOSIS — E876 Hypokalemia: Secondary | ICD-10-CM

## 2015-01-31 DIAGNOSIS — E785 Hyperlipidemia, unspecified: Secondary | ICD-10-CM | POA: Diagnosis not present

## 2015-01-31 DIAGNOSIS — R7989 Other specified abnormal findings of blood chemistry: Secondary | ICD-10-CM

## 2015-01-31 MED ORDER — ROSUVASTATIN CALCIUM 5 MG PO TABS: 5.0000 mg | ORAL_TABLET | Freq: Every day | ORAL | Status: DC

## 2015-01-31 NOTE — Patient Instructions (Signed)
Check blood sugars on waking up .Marland Kitchen2-3  .Marland Kitchen times a week Also check blood sugars about 2 hours after a meal and do this after different meals by rotation  Recommended blood sugar levels on waking up is 90-130 and about 2 hours after meal is 140-180 Please bring blood sugar monitor to each visit.  Crestor every 2-3 days  Novolog 6-7 at supper, less for low Carbs

## 2015-01-31 NOTE — Progress Notes (Signed)
Quick Note:  Please let patient know that forgot to discuss her slightly underactive thyroid, start 25 g levothyroxine, to be rechecked in 3 months ______

## 2015-01-31 NOTE — Progress Notes (Signed)
Patient ID: [name], female   DOB: 08-Apr-1941, 74 y.o.   MRN: [id number]    Reason for Appointment:   Diabetes follow-up   History of Present Illness    PROBLEM 1:   Problem 2: Type 2 diabetes mellitus, date of diagnosis: 1998.   Prior history: She had previously been treated with Byetta, Glumetza, Amaryl, Victoza and  Onglyza However because of inadequate control and intolerance to drugs she was finally given pre-meal insulin along with Amaryl, Glumetza eventually stopped because of side effects and also Lantus added  Recent history:  The insulin regimen is: Novolog 3-5 at breakfast and lunch,  8/9 units acs   LEVEMIR insulin 12 units daily hs   Her A1c has been mostly between about 7.1-7.4, now slightly higher than the last visit when it was at 6.8   She is generally doing well with her blood sugar control but checking blood sugars somewhat erratically  Current blood sugar patterns and problems with management:  She had relatively high readings after getting prednisone for asthmatic bronchitis about 3 weeks ago or so  More recently has had blood sugar only before her first meal and only occasionally later in the day as she forgets  She is taking couple of units more of the Novolog than before at suppertime and this may occasionally cause mild hypoglycemia  Fasting blood sugars are mostly near normal recently without overnight hypoglycemia.  She was taken off the Amaryl in the evening on the last visit  Has no readings after supper usually  Not exercising recently because of her asthma and other issues, planning to restart water aerobics  Had previously tried Victoza which caused nausea Hypoglycemia: None recently but did have a low sugar of 61 overnight on 08/06/14  Monitors blood glucose: 1-2 times a day, checking blood sugars mostly before meals and primarily in the mornings Glucometer: One Touch.   Mean values apply above for all meters except median  for One Touch  PRE-MEAL Fasting Lunch  5-11 PM  Bedtime Overall  Glucose range:  77-147   95-242   65-306     Mean/median:  106   160    117    Meals: 2-3 meals per day at 10 am. 2 pm and 6-8 pm but inconsistent schedule.  Will sometimes get protein at breakfast. Her snacks may be popcorn or chips   Physical activity: exercise:  Water aerobics 3/7 days a week, none now  Certified Diabetes Educator visit: Most recent: 7/13.  Dietician visit: Most recent: 3/13.   Wt Readings from Last 3 Encounters:  01/31/15 168 lb 3.2 oz (76.295 kg)  11/01/14 162 lb 12.8 oz (73.846 kg)  09/27/14 164 lb 3.2 oz (74.481 kg)   The microalbumin has been tested, and the result is normal Complications: Neuropathy  LABS:  Lab Results  Component Value Date   HGBA1C 7.2* 01/26/2015   HGBA1C 6.8* 10/27/2014   HGBA1C 8.1* 07/26/2014   Lab Results  Component Value Date   MICROALBUR 4.4* 07/26/2014   LDLCALC 147* 01/26/2015   CREATININE 0.99 01/26/2015        Medication List       This list is accurate as of: 01/31/15  9:09 PM.  Always use your most recent med list.               alendronate 70 MG tablet  Commonly known as:  FOSAMAX  Take 70 mg by mouth once a week. On Sundays  aspirin 81 MG EC tablet  TK 1 T PO QD     baclofen 10 MG tablet  Commonly known as:  LIORESAL  TAKE 1 TABLET(10 MG) BY MOUTH AT BEDTIME     BIOFREEZE ROLL-ON EX  Apply 1 application topically daily as needed (pain).     calcium carbonate 750 MG chewable tablet  Commonly known as:  TUMS EX  Chew 1 tablet by mouth 2 (two) times daily as needed for heartburn (indigestion).     Calcium-Magnesium-Zinc Tabs  Take 1 tablet by mouth daily. Calcium 1000 mg, magnesium 400 mg, zinc 25 mg     cetirizine 10 MG tablet  Commonly known as:  ZYRTEC  Take 10 mg by mouth daily.     fluticasone 50 MCG/ACT nasal spray  Commonly known as:  FLONASE  Place 1 spray into both nostrils daily.     gabapentin 300 MG  capsule  Commonly known as:  NEURONTIN  TAKE ONE CAPSULE BY MOUTH THREE TIMES DAILY     glimepiride 2 MG tablet  Commonly known as:  AMARYL  Take 2 mg by mouth 2 (two) times daily.     ibuprofen 800 MG tablet  Commonly known as:  ADVIL,MOTRIN  Take 800 mg by mouth 3 (three) times daily as needed (pain).     insulin aspart 100 UNIT/ML FlexPen  Commonly known as:  NOVOLOG FLEXPEN  Inject 6 units before breakfast, 10 units before lunch and 9 units before supper     Insulin Detemir 100 UNIT/ML Pen  Commonly known as:  LEVEMIR FLEXTOUCH  Inject 8 Units into the skin daily at 10 pm.     Insulin Pen Needle 32G X 4 MM Misc  Commonly known as:  BD PEN NEEDLE NANO U/F  Use as directed     LYRICA 50 MG capsule  Generic drug:  pregabalin     methocarbamol 500 MG tablet  Commonly known as:  ROBAXIN  Take 1 tablet (500 mg total) by mouth every 6 (six) hours as needed for muscle spasms.     Olopatadine HCl 0.6 % Soln  Place into the nose as needed.     predniSONE 10 MG tablet  Commonly known as:  DELTASONE     PROAIR RESPICLICK 673 (90 BASE) MCG/ACT Aepb  Generic drug:  Albuterol Sulfate  U 2 PUFFS PO Q 4 TO 6 H PRN     pyridOXINE 100 MG tablet  Commonly known as:  VITAMIN B-6  Take 100 mg by mouth daily.     rosuvastatin 5 MG tablet  Commonly known as:  CRESTOR  Take 1 tablet (5 mg total) by mouth daily.     spironolactone 50 MG tablet  Commonly known as:  ALDACTONE  TAKE 1 TABLET(50 MG) BY MOUTH DAILY     TUSSIN DM PO  Take 5 mLs by mouth daily as needed (cough).     valsartan-hydrochlorothiazide 320-12.5 MG tablet  Commonly known as:  DIOVAN-HCT  Take 1 tablet by mouth daily.     vitamin B-12 100 MCG tablet  Commonly known as:  CYANOCOBALAMIN  Take 100 mcg by mouth daily.     vitamin C 100 MG tablet  Take 100 mg by mouth daily.     vitamin E 400 UNIT capsule  Take 400 Units by mouth daily.        Allergies:  Allergies  Allergen Reactions  . Betadine  [Povidone Iodine] Swelling    Reaction to betadine eye drops  .  Adhesive [Tape] Rash  . Lipitor [Atorvastatin] Rash    Past Medical History  Diagnosis Date  . Complication of anesthesia     wakes up shaking  . Diabetes mellitus   . Heart murmur     sees Dr. Montez Morita, last seen- early 2012  . Asthma     has used inhaler in past for asthmatic bronchitis, last time- early 2012  . Sleep apnea     borderline sleep apnea, states she no longer uses, early 2012- stopped using   . GERD (gastroesophageal reflux disease)     occas. use of  Prilosec  . Neuromuscular disorder (HCC)     lumbar radiculopathy, lumbago  . Fibromyalgia   . Arthritis   . Hypertension     02/2010- stress test /w PCP  . Anemia     has sickle cell trait  . Nocturnal leg cramps 09/27/2014  . Dyslipidemia   . Sickle cell trait [facility])     Past Surgical History  Procedure Laterality Date  . Back surgery      2012, 2015 (3 total)  . Abdominal hysterectomy    . Adb.cyst      ovarian cyst  . Eye surgery      macular degeneration treatment - injections  . Ovarian cyst surgery      Family History  Problem Relation Age of Onset  . Anesthesia problems Neg Hx   . Hypotension Neg Hx   . Malignant hyperthermia Neg Hx   . Pseudochol deficiency Neg Hx   . Ovarian cancer Mother   . Cancer - Prostate Father   . Breast cancer Paternal Aunt   . Multiple myeloma Paternal Aunt     Social History:  reports that she quit smoking about 15 years ago. She does not have any smokeless tobacco history on file. She reports that she drinks alcohol. She reports that she does not use illicit drugs.  ROS    Leg cramps: She is still having some cramps and not clear if etiology.  Appears to continue to have cramps even without taking any statin drugs Has discussed this with her new PCP also  She was told to stop Crestor by the neurologist  NEUROPATHY: She has had tingling infeet and legs and also has pain going down her legs  especially at night. Lyrica is more beneficial than gabapentin for symptom control  Diabetic foot exam done in 1/16 Diabetic foot exam shows normal monofilament sensation in the toes and plantar surfaces, no skin lesions or ulcers on the feet and normal pedal pulses  Hyperlipidemia:   The lipid abnormality consists of elevated LDL , high triglyceride and did not tolerate lovastatin that was given to her last time even though she had taken it previously for a few years.  She thinks it caused muscle cramps  She is not sure if she had difficulties with Crestor, this was stopped by neurologist LDL is increased as expected  Lab Results  Component Value Date   CHOL 225* 01/26/2015   HDL 61.60 01/26/2015   LDLCALC 147* 01/26/2015   LDLDIRECT 153.2 10/27/2012   TRIG 82.0 01/26/2015   CHOLHDL 4 01/26/2015    HYPERTENSION:  Has been present for several years.  Now controlled with taking Diovan HCT as well as adding Aldactone  Does not monitor at home  Nonspecific fatigue: Does have a slightly high TSH  Lab Results  Component Value Date   TSH 5.69* 01/26/2015   TSH 2.26 10/05/2013  Examination:   BP 124/68 mmHg  Pulse 79  Temp(Src) 98.1 F (36.7 C)  Resp 16  Ht 5' 6" (1.676 m)  Wt 168 lb 3.2 oz (76.295 kg)  BMI 27.16 kg/m2  SpO2 92%  Body mass index is 27.16 kg/(m^2).    No ankle edema   Assesment/Plan:   1.  Hyperlipidemia: Her lipids are not controlled without any medications and she is not on any statins because of fear of muscle cramps.  However she is willing to try low-dose Crestor and will try 5 mg twice a week at least  2. DIABETES type 2 with BMI of 26  The patient's diabetes control appears to be  fairly well controlled even though she had some high readings with taking steroids last month She does not know how to adjust her insulin based on use of steroids or intercurrent illness, appears to have higher postprandial readings when she gets steroids and  illness See history of present illness for detailed discussion of his current management, blood sugar patterns and problems identified  She has improved  control with cutting back on her carbohydrates also Discussed that since she is tending to have mild hypoglycemia after supper she needs to cut back her suppertime coverage by at least 2 units She will also try to resume exercise Discussed timing and targets of blood sugars  She does need to check her blood sugars more consistently after meals to help adjust her mealtime dosage She will continue the same dose of Levemir for now  3. Hypertension: Blood pressure is  better controlled with continuing  Aldactone and potassium is stable  She will also continue Diovan HCT    Patient Instructions  Check blood sugars on waking up .Marland Kitchen2-3  .Marland Kitchen times a week Also check blood sugars about 2 hours after a meal and do this after different meals by rotation  Recommended blood sugar levels on waking up is 90-130 and about 2 hours after meal is 140-180 Please bring blood sugar monitor to each visit.  Crestor every 2-3 days  Novolog 6-7 at supper, less for low Carbs   Counseling time on subjects discussed above is over 50% of today's 25 minute visit   Britten Parady 01/31/2015, 9:09 PM   Addendum: She will try 25 g of levothyroxine for high TSH empirically   Appointment on 01/26/2015  Component Date Value Ref Range Status  . TSH 01/26/2015 5.69* 0.35 - 4.50 uIU/mL Final  . Hgb A1c MFr Bld 01/26/2015 7.2* 4.6 - 6.5 % Final   Glycemic Control Guidelines for People with Diabetes:Non Diabetic:  <6%Goal of Therapy: <7%Additional Action Suggested:  >8%   . Sodium 01/26/2015 141  135 - 145 mEq/L Final  . Potassium 01/26/2015 3.6  3.5 - 5.1 mEq/L Final  . Chloride 01/26/2015 103  96 - 112 mEq/L Final  . CO2 01/26/2015 30  19 - 32 mEq/L Final  . Glucose, Bld 01/26/2015 96  70 - 99 mg/dL Final  . BUN 01/26/2015 19  6 - 23 mg/dL Final  . Creatinine,  Ser 01/26/2015 0.99  0.40 - 1.20 mg/dL Final  . Total Bilirubin 01/26/2015 0.4  0.2 - 1.2 mg/dL Final  . Alkaline Phosphatase 01/26/2015 59  39 - 117 U/L Final  . AST 01/26/2015 15  0 - 37 U/L Final  . ALT 01/26/2015 11  0 - 35 U/L Final  . Total Protein 01/26/2015 6.8  6.0 - 8.3 g/dL Final  . Albumin 01/26/2015 4.1  3.5 - 5.2  g/dL Final  . Calcium 01/26/2015 9.5  8.4 - 10.5 mg/dL Final  . GFR 01/26/2015 70.30  >60.00 mL/min Final  . Cholesterol 01/26/2015 225* 0 - 200 mg/dL Final   ATP III Classification       Desirable:  < 200 mg/dL               Borderline High:  200 - 239 mg/dL          High:  > = 240 mg/dL  . Triglycerides 01/26/2015 82.0  0.0 - 149.0 mg/dL Final   Normal:  <150 mg/dLBorderline High:  150 - 199 mg/dL  . HDL 01/26/2015 61.60  >39.00 mg/dL Final  . VLDL 01/26/2015 16.4  0.0 - 40.0 mg/dL Final  . LDL Cholesterol 01/26/2015 147* 0 - 99 mg/dL Final  . Total CHOL/HDL Ratio 01/26/2015 4   Final                  Men          Women1/2 Average Risk     3.4          3.3Average Risk          5.0          4.42X Average Risk          9.6          7.13X Average Risk          15.0          11.0                      . NonHDL 01/26/2015 163.29   Final   NOTE:  Non-HDL goal should be 30 mg/dL higher than patient's LDL goal (i.e. LDL goal of < 70 mg/dL, would have non-HDL goal of < 100 mg/dL)

## 2015-02-01 ENCOUNTER — Other Ambulatory Visit: Payer: Self-pay | Admitting: Endocrinology

## 2015-02-01 ENCOUNTER — Other Ambulatory Visit: Payer: Self-pay | Admitting: *Deleted

## 2015-02-01 MED ORDER — LEVOTHYROXINE SODIUM 25 MCG PO TABS: 25.0000 ug | ORAL_TABLET | Freq: Every day | ORAL | Status: DC

## 2015-02-03 ENCOUNTER — Encounter: Payer: Self-pay | Admitting: Rehabilitative and Restorative Service Providers"

## 2015-02-03 NOTE — Therapy (Signed)
[place] [address] [place], [place], [address] Phone: [phone]   Fax:  [phone]  Patient Details  Name: [name] MRN: [id number] Date of Birth: 1940/12/31 Referring Provider:  No ref. provider found  Encounter Date: last encounter 12/22/2014  PHYSICAL THERAPY DISCHARGE SUMMARY  Visits from Start of Care: 3  Current functional level related to goals / functional outcomes:     PT Short Term Goals - 12/22/14 1256    PT SHORT TERM GOAL #1   Title The patient will be indep with HEP for cervical and postural stretching.   Baseline Met on 12/22/2014   Time 4   Period Weeks   Status Achieved   PT SHORT TERM GOAL #2   Title The patient will tolerate gaze x 1 viewing x 30 seconds nonstop.   Baseline Met on 12/22/2014   Time 4   Period Weeks   Status Achieved   PT SHORT TERM GOAL #3   Title The patient will improve single limb stance from 3 seconds bilat to > or equal to 5 seconds.   Baseline Target date 01/05/2015   Time 4   Period Weeks   Status On-going   PT SHORT TERM GOAL #4   Title The patient will report neck pain < or equal to 3/10 (from baseline of 5/10).   Baseline Target date 01/05/2015   Time 4   Period Weeks   Status On-going         PT Long Term Goals - 12/05/14 1458    PT LONG TERM GOAL #1   Title The patient will improve DHI from 32% to < or equal to 20%.   Baseline Target date 01/19/2015   Time 6   Period Weeks   PT LONG TERM GOAL #2   Title The patient will be indep with HEP for post d/c progression.   Baseline Target date 01/19/2015   Time 6   Period Weeks   PT LONG TERM GOAL #3   Title The patient will verbalize 3 strategies to reduce neck and L UE pain for mgmt of chronic neck issues.   Baseline Target date 01/19/2015   Time 6   Period Weeks     *Patient did not return to PT.  See initial evaluation for patient status.   Remaining deficits: Patient did not return, see  initial evaluation   Education / Equipment: HEP.  Plan: Patient agrees to discharge.  Patient goals were partially met. Patient is being discharged due to not returning since the last visit.  ?????         Thank you for the referral of this patient. Rudell Cobb, MPT  Linette Gunderson 02/03/2015, 1:03 PM  [facility] [address] [place], [place], [address] Phone: [phone]   Fax:  [phone]

## 2015-02-08 ENCOUNTER — Encounter: Payer: Self-pay | Admitting: Neurology

## 2015-02-08 ENCOUNTER — Ambulatory Visit (INDEPENDENT_AMBULATORY_CARE_PROVIDER_SITE_OTHER): Payer: Medicare Other | Admitting: Neurology

## 2015-02-08 VITALS — BP 144/81 | HR 77 | Ht 66.0 in | Wt 168.0 lb

## 2015-02-08 DIAGNOSIS — M797 Fibromyalgia: Secondary | ICD-10-CM | POA: Diagnosis not present

## 2015-02-08 DIAGNOSIS — G4762 Sleep related leg cramps: Secondary | ICD-10-CM

## 2015-02-08 NOTE — Progress Notes (Signed)
Reason for visit: Muscle cramps  [name] is an 74 y.o. female  History of present illness:  [name] is a 74 year old right-handed black female with a history of muscle cramps. She clearly indicates that when she is on statin drugs for her cholesterol the cramps are difficult to control. The patient is on some baclofen at night which seems to be beneficial, but even then she may occasionally wake up in the middle the night with a cramp. The patient has noted that she drinks a lot of water and this seems to help. The patient has recently been placed on Crestor, but she has not yet started the medication. She returns to this office for an evaluation. She is not clear that she is to go up on the dose of the baclofen.  Past Medical History  Diagnosis Date  . Complication of anesthesia     wakes up shaking  . Diabetes mellitus   . Heart murmur     sees Dr. Montez Morita, last seen- early 2012  . Asthma     has used inhaler in past for asthmatic bronchitis, last time- early 2012  . Sleep apnea     borderline sleep apnea, states she no longer uses, early 2012- stopped using   . GERD (gastroesophageal reflux disease)     occas. use of  Prilosec  . Neuromuscular disorder (HCC)     lumbar radiculopathy, lumbago  . Fibromyalgia   . Arthritis   . Hypertension     02/2010- stress test /w PCP  . Anemia     has sickle cell trait  . Nocturnal leg cramps 09/27/2014  . Dyslipidemia   . Sickle cell trait [facility])     Past Surgical History  Procedure Laterality Date  . Back surgery      2012, 2015 (3 total)  . Abdominal hysterectomy    . Adb.cyst      ovarian cyst  . Eye surgery      macular degeneration treatment - injections  . Ovarian cyst surgery      Family History  Problem Relation Age of Onset  . Anesthesia problems Neg Hx   . Hypotension Neg Hx   . Malignant hyperthermia Neg Hx   . Pseudochol deficiency Neg Hx   . Ovarian cancer Mother   . Cancer - Prostate Father   .  Breast cancer Paternal Aunt   . Multiple myeloma Paternal Aunt     Social history:  reports that she quit smoking about 15 years ago. She has never used smokeless tobacco. She reports that she drinks alcohol. She reports that she does not use illicit drugs.    Allergies  Allergen Reactions  . Betadine [Povidone Iodine] Swelling    Reaction to betadine eye drops  . Adhesive [Tape] Rash  . Lipitor [Atorvastatin] Rash    Medications:  Prior to Admission medications   Medication Sig Start Date End Date Taking? Authorizing Provider  alendronate (FOSAMAX) 70 MG tablet Take 70 mg by mouth once a week. On Sundays 08/27/13  Yes Historical Provider, MD  Ascorbic Acid (VITAMIN C) 100 MG tablet Take 100 mg by mouth daily.   Yes Historical Provider, MD  aspirin 81 MG EC tablet TK 1 T PO QD 01/16/15  Yes Historical Provider, MD  baclofen (LIORESAL) 10 MG tablet TAKE 1 TABLET(10 MG) BY MOUTH AT BEDTIME 01/11/15  Yes Kathrynn Ducking, MD  calcium carbonate (TUMS EX) 750 MG chewable tablet Chew 1 tablet by  mouth 2 (two) times daily as needed for heartburn (indigestion).   Yes Historical Provider, MD  cetirizine (ZYRTEC) 10 MG tablet Take 10 mg by mouth daily.   Yes Historical Provider, MD  Dextromethorphan-Guaifenesin (TUSSIN DM PO) Take 5 mLs by mouth daily as needed (cough).   Yes Historical Provider, MD  fluticasone (FLONASE) 50 MCG/ACT nasal spray Place 1 spray into both nostrils daily.  12/31/12  Yes Historical Provider, MD  gabapentin (NEURONTIN) 300 MG capsule TAKE ONE CAPSULE BY MOUTH THREE TIMES DAILY 01/13/15  Yes [name], MD  glimepiride (AMARYL) 2 MG tablet Take 2 mg by mouth 2 (two) times daily.    Yes Historical Provider, MD  ibuprofen (ADVIL,MOTRIN) 800 MG tablet Take 800 mg by mouth 3 (three) times daily as needed (pain).  01/22/13  Yes Historical Provider, MD  insulin aspart (NOVOLOG FLEXPEN) 100 UNIT/ML FlexPen Inject 6 units before breakfast, 10 units before lunch and 9 units before supper  10/14/14  Yes [name], MD  Insulin Detemir (LEVEMIR FLEXTOUCH) 100 UNIT/ML Pen Inject 8 Units into the skin daily at 10 pm. Patient taking differently: Inject 12 Units into the skin daily at 10 pm.  09/20/14  Yes [name], MD  Insulin Pen Needle (BD PEN NEEDLE NANO U/F) 32G X 4 MM MISC Use as directed 01/23/15  Yes [name], MD  levothyroxine (SYNTHROID, LEVOTHROID) 25 MCG tablet TAKE 1 TABLET(25 MCG) BY MOUTH DAILY BEFORE BREAKFAST 02/01/15  Yes [name], MD  LYRICA 50 MG capsule  05/23/14  Yes Historical Provider, MD  Menthol, Topical Analgesic, (BIOFREEZE ROLL-ON EX) Apply 1 application topically daily as needed (pain).   Yes Historical Provider, MD  Olopatadine HCl 0.6 % SOLN Place into the nose as needed.   Yes Historical Provider, MD  PROAIR RESPICLICK 644 (90 BASE) MCG/ACT AEPB U 2 PUFFS PO Q 4 TO 6 H PRN 01/16/15  Yes Historical Provider, MD  pyridOXINE (VITAMIN B-6) 100 MG tablet Take 100 mg by mouth daily.   Yes Historical Provider, MD  valsartan-hydrochlorothiazide (DIOVAN-HCT) 320-12.5 MG per tablet Take 1 tablet by mouth daily.    Yes Historical Provider, MD  vitamin B-12 (CYANOCOBALAMIN) 100 MCG tablet Take 100 mcg by mouth daily.   Yes Historical Provider, MD  vitamin E 400 UNIT capsule Take 400 Units by mouth daily.   Yes Historical Provider, MD  rosuvastatin (CRESTOR) 5 MG tablet Take 1 tablet (5 mg total) by mouth daily. Patient not taking: Reported on 02/08/2015 01/31/15   [name], MD  spironolactone (ALDACTONE) 50 MG tablet TAKE 1 TABLET(50 MG) BY MOUTH DAILY Patient not taking: Reported on 02/08/2015 10/27/14   [name], MD    ROS:  Out of a complete 14 system review of symptoms, the patient complains only of the following symptoms, and all other reviewed systems are negative.  Muscle cramps Abdominal swelling  Blood pressure 144/81, pulse 77, height _0  (1.676 m), weight 168 lb (76.204 kg).  Physical Exam  General: The patient is alert and cooperative at  the time of the examination.  Skin: No significant peripheral edema is noted.   Neurologic Exam  Mental status: The patient is alert and oriented x 3 at the time of the examination. The patient has apparent normal recent and remote memory, with an apparently normal attention span and concentration ability.   Cranial nerves: Facial symmetry is present. Speech is normal, no aphasia or dysarthria is noted. Extraocular movements are full. Visual fields are full.  Motor: The  patient has good strength in all 4 extremities.  Sensory examination: Soft touch sensation is symmetric on the face, arms, and legs.  Coordination: The patient has good finger-nose-finger and heel-to-shin bilaterally.  Gait and station: The patient has a normal gait. Tandem gait is normal. Romberg is negative. No drift is seen.  Reflexes: Deep tendon reflexes are symmetric.   Assessment/Plan:  1. Nocturnal leg cramps  The patient is doing well at this time, she will continue the baclofen, she is sensitive to the statin drugs as this seems to significantly worsen the muscle cramps. Multiple family members seem to have nocturnal leg cramps as well. The patient will follow-up through this office in 6 months, sooner if needed.  Jill Alexanders MD 02/08/2015 6:57 PM  [facility] [address] [place], [place] [address]  Phone [phone] Fax [phone]

## 2015-02-08 NOTE — Patient Instructions (Signed)

## 2015-02-16 ENCOUNTER — Encounter: Payer: Self-pay | Admitting: Endocrinology

## 2015-02-16 ENCOUNTER — Other Ambulatory Visit: Payer: Self-pay | Admitting: Endocrinology

## 2015-02-16 ENCOUNTER — Telehealth: Payer: Self-pay | Admitting: Neurology

## 2015-02-16 ENCOUNTER — Other Ambulatory Visit: Payer: Self-pay | Admitting: Neurology

## 2015-02-16 ENCOUNTER — Encounter: Payer: Self-pay | Admitting: Neurology

## 2015-02-16 MED ORDER — BACLOFEN 20 MG PO TABS: 20.0000 mg | ORAL_TABLET | Freq: Every day | ORAL | Status: DC

## 2015-02-16 NOTE — Telephone Encounter (Signed)
I called patient. The patient has had increased problems with nocturnal leg cramps. I will increase the baclofen dosing taking 20 mg at night. A prescription was called in.

## 2015-02-16 NOTE — Telephone Encounter (Signed)
I called the patient. She stated that Dr. Jannifer Franklin mentioned at her last office visit that he could increase her dose of Baclofen. She declined at the time but states that she has been up all night for the past few nights and would like to try an increased dose now. I advised that I would ask Dr. Jannifer Franklin and we would call her back.

## 2015-03-06 DIAGNOSIS — J45909 Unspecified asthma, uncomplicated: Secondary | ICD-10-CM | POA: Diagnosis not present

## 2015-03-06 DIAGNOSIS — J019 Acute sinusitis, unspecified: Secondary | ICD-10-CM | POA: Diagnosis not present

## 2015-03-08 NOTE — Telephone Encounter (Signed)
Pt is back on prednisone

## 2015-03-13 DIAGNOSIS — H353211 Exudative age-related macular degeneration, right eye, with active choroidal neovascularization: Secondary | ICD-10-CM | POA: Diagnosis not present

## 2015-04-11 ENCOUNTER — Other Ambulatory Visit: Payer: Self-pay | Admitting: Internal Medicine

## 2015-04-11 ENCOUNTER — Ambulatory Visit
Admission: RE | Admit: 2015-04-11 | Discharge: 2015-04-11 | Disposition: A | Discharge status: Home / Self Care | Payer: Medicare Other | Source: Ambulatory Visit | Attending: Internal Medicine | Admitting: Internal Medicine

## 2015-04-11 DIAGNOSIS — M25561 Pain in right knee: Secondary | ICD-10-CM

## 2015-04-11 DIAGNOSIS — M1711 Unilateral primary osteoarthritis, right knee: Secondary | ICD-10-CM | POA: Diagnosis not present

## 2015-04-12 ENCOUNTER — Encounter: Payer: Self-pay | Admitting: Adult Health

## 2015-04-28 ENCOUNTER — Other Ambulatory Visit (INDEPENDENT_AMBULATORY_CARE_PROVIDER_SITE_OTHER): Payer: Medicare Other

## 2015-04-28 DIAGNOSIS — Z794 Long term (current) use of insulin: Secondary | ICD-10-CM

## 2015-04-28 DIAGNOSIS — E11649 Type 2 diabetes mellitus with hypoglycemia without coma: Secondary | ICD-10-CM | POA: Diagnosis not present

## 2015-04-28 DIAGNOSIS — R7989 Other specified abnormal findings of blood chemistry: Secondary | ICD-10-CM

## 2015-04-28 DIAGNOSIS — E785 Hyperlipidemia, unspecified: Secondary | ICD-10-CM | POA: Diagnosis not present

## 2015-04-28 LAB — LIPID PANEL
CHOL/HDL RATIO: 5
Cholesterol: 264 mg/dL — ABNORMAL HIGH (ref 0–200)
HDL: 49.8 mg/dL (ref >39.00)
LDL Cholesterol: 177 mg/dL — ABNORMAL HIGH (ref 0–99)
NONHDL: 214.63
TRIGLYCERIDES: 186 mg/dL — AB (ref 0.0–149.0)
VLDL: 37.2 mg/dL (ref 0.0–40.0)

## 2015-04-28 LAB — COMPREHENSIVE METABOLIC PANEL
ALK PHOS: 56 U/L (ref 39–117)
ALT: 11 U/L (ref 0–35)
AST: 17 U/L (ref 0–37)
Albumin: 4.2 g/dL (ref 3.5–5.2)
BILIRUBIN TOTAL: 0.5 mg/dL (ref 0.2–1.2)
BUN: 16 mg/dL (ref 6–23)
CALCIUM: 9.8 mg/dL (ref 8.4–10.5)
CO2: 31 meq/L (ref 19–32)
CREATININE: 0.94 mg/dL (ref 0.40–1.20)
Chloride: 102 mEq/L (ref 96–112)
GFR: 74.58 mL/min (ref >60.00)
GLUCOSE: 176 mg/dL — AB (ref 70–99)
Potassium: 3.6 mEq/L (ref 3.5–5.1)
Sodium: 141 mEq/L (ref 135–145)
TOTAL PROTEIN: 6.7 g/dL (ref 6.0–8.3)

## 2015-04-28 LAB — TSH: TSH: 4.59 u[IU]/mL — ABNORMAL HIGH (ref 0.35–4.50)

## 2015-04-28 LAB — HEMOGLOBIN A1C: Hgb A1c MFr Bld: 7.4 % — ABNORMAL HIGH (ref 4.6–6.5)

## 2015-05-04 ENCOUNTER — Encounter: Payer: Self-pay | Admitting: Endocrinology

## 2015-05-04 ENCOUNTER — Ambulatory Visit (INDEPENDENT_AMBULATORY_CARE_PROVIDER_SITE_OTHER): Payer: Medicare Other | Admitting: Endocrinology

## 2015-05-04 VITALS — BP 130/64 | HR 82 | Temp 98.0°F | Wt 174.6 lb

## 2015-05-04 DIAGNOSIS — R252 Cramp and spasm: Secondary | ICD-10-CM

## 2015-05-04 DIAGNOSIS — E1165 Type 2 diabetes mellitus with hyperglycemia: Secondary | ICD-10-CM

## 2015-05-04 DIAGNOSIS — E785 Hyperlipidemia, unspecified: Secondary | ICD-10-CM | POA: Diagnosis not present

## 2015-05-04 DIAGNOSIS — R7989 Other specified abnormal findings of blood chemistry: Secondary | ICD-10-CM

## 2015-05-04 DIAGNOSIS — Z794 Long term (current) use of insulin: Secondary | ICD-10-CM | POA: Diagnosis not present

## 2015-05-04 MED ORDER — EZETIMIBE 10 MG PO TABS: 10.0000 mg | ORAL_TABLET | Freq: Every day | ORAL | Status: DC

## 2015-05-04 NOTE — Progress Notes (Signed)
Pre visit review using our clinic review tool, if applicable. No additional management support is needed unless otherwise documented below in the visit note. 

## 2015-05-04 NOTE — Progress Notes (Signed)
Patient ID: [name], female   DOB: 1940-11-12, 75 y.o.   MRN: [id number]    Reason for Appointment:    follow-up of various issues  History of Present Illness    PROBLEM 1: Type 2 diabetes mellitus, date of diagnosis: 1998.   Prior history: She had previously been treated with Byetta, Glumetza, Amaryl, Victoza and  Onglyza However because of inadequate control and intolerance to drugs she was finally given pre-meal insulin along with Amaryl, Glumetza eventually stopped because of side effects and also Lantus added  Recent history:  The insulin regimen is: Novolog 3-8 at breakfast and lunch,  8/9 units acs   LEVEMIR insulin 12 units daily hs   Low 1x variable wt watchers better   Her A1c has been mostly between about 6.8 -7.4, now getting gradually higher up to 7.4  Current blood sugar patterns and problems with management:  She did not bring her monitor for download today and blood clear what her blood sugar patterns are  She thinks her fasting readings are mostly higher recently even though she has been off prednisone and are mostly around 160-180  She also does not remember readings later in the day and they have been variable  More recently she thinks she is trying Weight Watchers diet and blood sugars are somewhat lower including one episode of low blood sugar of 60  She does try to increase her Novolog based on her blood sugar level and what she is eating but is not able to adjust this accurately  Not able to exercise recently  She has gained weight and she is complaining about this, has been on a couple of rounds of prednisone from PCP  She continues to take Amaryl in the morning with doubtful benefit  She does not adjust her Levemir based on fasting blood sugar patterns, also complaining about the high cost on the doughnut hole  Had previously tried Victoza which caused nausea Hypoglycemia: None recently but did have a low sugar of about 60  once  Monitors blood glucose: 1-2 times a day, readings as above Glucometer: One Touch.   Meals: 2-3 meals per day at 10 am. 2 pm and 6-8 pm but inconsistent schedule.  Will try to get protein at breakfast.  Getting more soups now Her snacks may be popcorn or chips   Physical activity: exercise:  Previously doing Water aerobics 3/7 days a week, none now  Certified Diabetes Educator visit: Most recent: 7/13.  Dietician visit: Most recent: 3/13.   Wt Readings from Last 3 Encounters:  05/04/15 174 lb 9.6 oz (79.198 kg)  02/08/15 168 lb (76.204 kg)  01/31/15 168 lb 3.2 oz (26.203 kg)   Complications: Neuropathy  LABS:  Lab Results  Component Value Date   HGBA1C 7.4* 04/28/2015   HGBA1C 7.2* 01/26/2015   HGBA1C 6.8* 10/27/2014   Lab Results  Component Value Date   MICROALBUR 4.4* 07/26/2014   LDLCALC 177* 04/28/2015   CREATININE 0.94 04/28/2015    Multiple other issues are addressed in review of systems     Medication List       This list is accurate as of: 05/04/15 10:46 AM.  Always use your most recent med list.               aspirin 81 MG EC tablet  TK 1 T PO QD     baclofen 20 MG tablet  Commonly known as:  LIORESAL  Take 1 tablet (20 mg  total) by mouth at bedtime.     BIOFREEZE ROLL-ON EX  Apply 1 application topically daily as needed (pain).     calcium carbonate 750 MG chewable tablet  Commonly known as:  TUMS EX  Chew 1 tablet by mouth 2 (two) times daily as needed for heartburn (indigestion).     cetirizine 10 MG tablet  Commonly known as:  ZYRTEC  Take 10 mg by mouth daily.     ezetimibe 10 MG tablet  Commonly known as:  ZETIA  Take 1 tablet (10 mg total) by mouth daily.     fluticasone 50 MCG/ACT nasal spray  Commonly known as:  FLONASE  Place 1 spray into both nostrils daily.     gabapentin 300 MG capsule  Commonly known as:  NEURONTIN  TAKE ONE CAPSULE BY MOUTH THREE TIMES DAILY     glimepiride 2 MG tablet  Commonly known as:   AMARYL  TAKE 2 TABLETS BY MOUTH EVERY MORNING AND 2 TABLETS EVERY EVENING     ibuprofen 800 MG tablet  Commonly known as:  ADVIL,MOTRIN  Take 800 mg by mouth 3 (three) times daily as needed (pain).     insulin aspart 100 UNIT/ML FlexPen  Commonly known as:  NOVOLOG FLEXPEN  Inject 6 units before breakfast, 10 units before lunch and 9 units before supper     Insulin Detemir 100 UNIT/ML Pen  Commonly known as:  LEVEMIR FLEXTOUCH  Inject 8 Units into the skin daily at 10 pm.     Insulin Pen Needle 32G X 4 MM Misc  Commonly known as:  BD PEN NEEDLE NANO U/F  Use as directed     levothyroxine 25 MCG tablet  Commonly known as:  SYNTHROID, LEVOTHROID  TAKE 1 TABLET(25 MCG) BY MOUTH DAILY BEFORE BREAKFAST     LYRICA 50 MG capsule  Generic drug:  pregabalin     Olopatadine HCl 0.6 % Soln  Place into the nose as needed.     PROAIR RESPICLICK 932 (90 Base) MCG/ACT Aepb  Generic drug:  Albuterol Sulfate  U 2 PUFFS PO Q 4 TO 6 H PRN     pyridOXINE 100 MG tablet  Commonly known as:  VITAMIN B-6  Take 100 mg by mouth daily.     TUSSIN DM PO  Take 5 mLs by mouth daily as needed (cough).     valsartan-hydrochlorothiazide 320-12.5 MG tablet  Commonly known as:  DIOVAN-HCT  Take 1 tablet by mouth daily.     vitamin B-12 100 MCG tablet  Commonly known as:  CYANOCOBALAMIN  Take 100 mcg by mouth daily.     vitamin C 100 MG tablet  Take 100 mg by mouth daily.     vitamin E 400 UNIT capsule  Take 400 Units by mouth daily.        Allergies:  Allergies  Allergen Reactions  . Betadine [Povidone Iodine] Swelling    Reaction to betadine eye drops  . Adhesive [Tape] Rash  . Lipitor [Atorvastatin] Rash    Past Medical History  Diagnosis Date  . Complication of anesthesia     wakes up shaking  . Diabetes mellitus   . Heart murmur     sees Dr. Montez Morita, last seen- early 2012  . Asthma     has used inhaler in past for asthmatic bronchitis, last time- early 2012  . Sleep apnea      borderline sleep apnea, states she no longer uses, early 2012- stopped using   .  GERD (gastroesophageal reflux disease)     occas. use of  Prilosec  . Neuromuscular disorder (HCC)     lumbar radiculopathy, lumbago  . Fibromyalgia   . Arthritis   . Hypertension     02/2010- stress test /w PCP  . Anemia     has sickle cell trait  . Nocturnal leg cramps 09/27/2014  . Dyslipidemia   . Sickle cell trait [facility])     Past Surgical History  Procedure Laterality Date  . Back surgery      2012, 2015 (3 total)  . Abdominal hysterectomy    . Adb.cyst      ovarian cyst  . Eye surgery      macular degeneration treatment - injections  . Ovarian cyst surgery      Family History  Problem Relation Age of Onset  . Anesthesia problems Neg Hx   . Hypotension Neg Hx   . Malignant hyperthermia Neg Hx   . Pseudochol deficiency Neg Hx   . Ovarian cancer Mother   . Cancer - Prostate Father   . Breast cancer Paternal Aunt   . Multiple myeloma Paternal Aunt     Social History:  reports that she quit smoking about 16 years ago. She has never used smokeless tobacco. She reports that she drinks alcohol. She reports that she does not use illicit drugs.  ROS    Leg cramps: She is still having some cramps and not clear of the etiology.   She says she has cramps despite not taking any statin drugs although these make them worse  NEUROPATHY: She has had tingling infeet and legs and also has pain going down her legs especially at night. Lyrica is more beneficial than gabapentin for symptom control but she does not like to take it regularly because of fear of weight gain  Diabetic foot exam done in 1/16 Diabetic foot exam shows normal monofilament sensation in the toes and plantar surfaces, no skin lesions or ulcers on the feet and normal pedal pulses  Hyperlipidemia:   The lipid abnormality consists of elevated LDL , high triglyceride and did not tolerate Crestor or lovastatin that have been tried  more recently She thinks they make her cramps worse LDL is increased as expected but also has not been consistent with diet  Lab Results  Component Value Date   CHOL 264* 04/28/2015   HDL 49.80 04/28/2015   LDLCALC 177* 04/28/2015   LDLDIRECT 153.2 10/27/2012   TRIG 186.0* 04/28/2015   CHOLHDL 5 04/28/2015    HYPERTENSION:  Has been present for several years.   Now controlled with taking Diovan HCT, she was on Aldactone but somehow she is not taking this, potassium stable Does not monitor at home  Nonspecific fatigue: Does have a slightly high TSH and was empirically given Synthroid 25 g but she thinks it makes her shaky and does not feel any better  Lab Results  Component Value Date   TSH 4.59* 04/28/2015   TSH 5.69* 01/26/2015   TSH 2.26 10/05/2013         Examination:   BP 130/64 mmHg  Pulse 82  Temp(Src) 98 F (36.7 C) (Oral)  Wt 174 lb 9.6 oz (79.198 kg)  Body mass index is 28.19 kg/(m^2).    No ankle edema present   Assesment/Plan:   1.  Hyperlipidemia: Her lipids are poorly controlled without any medications and she is not on any statins because of reported muscle cramps She will try Zetia 10 mg  daily  2. DIABETES type 2 with BMI of 28 See history of present illness for detailed discussion of his current management, blood sugar patterns and problems identified The patient's diabetes control appears to be  fairly well controlled although A1c is trending higher More recently for some reason her fasting readings are high She takes insulin only with Amaryl in the morning, previously has been tried other drugs which she cannot tolerate or she is concerned about the cost  For now she can increase her Levemir by 2-3 units to get morning sugars under 130 at least She will need to bring her monitor for download on the next visit Advised her to have protein with every meal consistently She would check on the cost of Tresiba compared to Levemir, may also get more  insulin per box She will also try to resume exercise with water aerobics Discussed timing and targets of blood sugar monitoring  She does need to check her blood sugars more consistently after meals to help adjust her mealtime dosage She will continue the same dose of Levemir for now  3. Hypertension: Blood pressure is controlled on Diovan HCT alone Since potassium is not low will hold off on Aldactone, recheck in 6 weeks  4.  Muscle cramps, joint pains: She will discuss management with rheumatologist is pending  5.  Increase in TSH: She did not benefit from a trial of levothyroxine and claims he is more shaky with this, she can stop for now and continue to follow periodically    Patient Instructions  LEVEMIR insulin 14-15 units daily at nite tokeep am sugar <130  Check blood sugars on waking up 3-4  times a week Also check blood sugars about 2 hours after a meal and do this after different meals by rotation  Recommended blood sugar levels on waking up is 90-130 and about 2 hours after meal is 130-160  Please bring your blood sugar monitor to each visit, thank you  Check cost of Tresiba instead of Levemir  Stop Thyroid Med     Counseling time on subjects discussed above is over 50% of today's 25 minute visit   Farrie Sann 05/04/2015, 10:46 AM       Lab on 04/28/2015  Component Date Value Ref Range Status  . Hgb A1c MFr Bld 04/28/2015 7.4* 4.6 - 6.5 % Final   Glycemic Control Guidelines for People with Diabetes:Non Diabetic:  <6%Goal of Therapy: <7%Additional Action Suggested:  >8%   . Sodium 04/28/2015 141  135 - 145 mEq/L Final  . Potassium 04/28/2015 3.6  3.5 - 5.1 mEq/L Final  . Chloride 04/28/2015 102  96 - 112 mEq/L Final  . CO2 04/28/2015 31  19 - 32 mEq/L Final  . Glucose, Bld 04/28/2015 176* 70 - 99 mg/dL Final  . BUN 04/28/2015 16  6 - 23 mg/dL Final  . Creatinine, Ser 04/28/2015 0.94  0.40 - 1.20 mg/dL Final  . Total Bilirubin 04/28/2015 0.5  0.2 - 1.2  mg/dL Final  . Alkaline Phosphatase 04/28/2015 56  39 - 117 U/L Final  . AST 04/28/2015 17  0 - 37 U/L Final  . ALT 04/28/2015 11  0 - 35 U/L Final  . Total Protein 04/28/2015 6.7  6.0 - 8.3 g/dL Final  . Albumin 04/28/2015 4.2  3.5 - 5.2 g/dL Final  . Calcium 04/28/2015 9.8  8.4 - 10.5 mg/dL Final  . GFR 04/28/2015 74.58  >60.00 mL/min Final  . Cholesterol 04/28/2015 264* 0 - 200 mg/dL Final  ATP III Classification       Desirable:  < 200 mg/dL               Borderline High:  200 - 239 mg/dL          High:  > = 240 mg/dL  . Triglycerides 04/28/2015 186.0* 0.0 - 149.0 mg/dL Final   Normal:  <150 mg/dLBorderline High:  150 - 199 mg/dL  . HDL 04/28/2015 49.80  >39.00 mg/dL Final  . VLDL 04/28/2015 37.2  0.0 - 40.0 mg/dL Final  . LDL Cholesterol 04/28/2015 177* 0 - 99 mg/dL Final  . Total CHOL/HDL Ratio 04/28/2015 5   Final                  Men          Women1/2 Average Risk     3.4          3.3Average Risk          5.0          4.42X Average Risk          9.6          7.13X Average Risk          15.0          11.0                      . NonHDL 04/28/2015 214.63   Final   NOTE:  Non-HDL goal should be 30 mg/dL higher than patient's LDL goal (i.e. LDL goal of < 70 mg/dL, would have non-HDL goal of < 100 mg/dL)  . TSH 04/28/2015 4.59* 0.35 - 4.50 uIU/mL Final

## 2015-05-04 NOTE — Patient Instructions (Addendum)
LEVEMIR insulin 14-15 units daily at nite tokeep am sugar <130  Check blood sugars on waking up 3-4  times a week Also check blood sugars about 2 hours after a meal and do this after different meals by rotation  Recommended blood sugar levels on waking up is 90-130 and about 2 hours after meal is 130-160  Please bring your blood sugar monitor to each visit, thank you  Check cost of Tresiba instead of Levemir  Stop Thyroid Med

## 2015-06-02 DIAGNOSIS — M797 Fibromyalgia: Secondary | ICD-10-CM | POA: Diagnosis not present

## 2015-06-02 DIAGNOSIS — M19241 Secondary osteoarthritis, right hand: Secondary | ICD-10-CM | POA: Diagnosis not present

## 2015-06-02 DIAGNOSIS — M25561 Pain in right knee: Secondary | ICD-10-CM | POA: Diagnosis not present

## 2015-06-02 DIAGNOSIS — R5381 Other malaise: Secondary | ICD-10-CM | POA: Diagnosis not present

## 2015-06-09 DIAGNOSIS — M1711 Unilateral primary osteoarthritis, right knee: Secondary | ICD-10-CM | POA: Diagnosis not present

## 2015-06-12 DIAGNOSIS — H353133 Nonexudative age-related macular degeneration, bilateral, advanced atrophic without subfoveal involvement: Secondary | ICD-10-CM | POA: Diagnosis not present

## 2015-06-12 DIAGNOSIS — M79672 Pain in left foot: Secondary | ICD-10-CM | POA: Diagnosis not present

## 2015-06-12 DIAGNOSIS — M170 Bilateral primary osteoarthritis of knee: Secondary | ICD-10-CM | POA: Diagnosis not present

## 2015-06-12 DIAGNOSIS — H35363 Drusen (degenerative) of macula, bilateral: Secondary | ICD-10-CM | POA: Diagnosis not present

## 2015-06-12 DIAGNOSIS — M79642 Pain in left hand: Secondary | ICD-10-CM | POA: Diagnosis not present

## 2015-06-12 DIAGNOSIS — H353211 Exudative age-related macular degeneration, right eye, with active choroidal neovascularization: Secondary | ICD-10-CM | POA: Diagnosis not present

## 2015-06-12 DIAGNOSIS — M0579 Rheumatoid arthritis with rheumatoid factor of multiple sites without organ or systems involvement: Secondary | ICD-10-CM | POA: Diagnosis not present

## 2015-06-12 DIAGNOSIS — H3561 Retinal hemorrhage, right eye: Secondary | ICD-10-CM | POA: Diagnosis not present

## 2015-06-12 DIAGNOSIS — E669 Obesity, unspecified: Secondary | ICD-10-CM | POA: Diagnosis not present

## 2015-06-12 DIAGNOSIS — M79641 Pain in right hand: Secondary | ICD-10-CM | POA: Diagnosis not present

## 2015-06-12 DIAGNOSIS — M79671 Pain in right foot: Secondary | ICD-10-CM | POA: Diagnosis not present

## 2015-07-06 ENCOUNTER — Ambulatory Visit (INDEPENDENT_AMBULATORY_CARE_PROVIDER_SITE_OTHER): Payer: Medicare Other | Admitting: Neurology

## 2015-07-06 ENCOUNTER — Encounter: Payer: Self-pay | Admitting: Neurology

## 2015-07-06 VITALS — BP 120/80 | HR 80 | Ht 66.0 in | Wt 173.0 lb

## 2015-07-06 DIAGNOSIS — G4762 Sleep related leg cramps: Secondary | ICD-10-CM | POA: Diagnosis not present

## 2015-07-06 DIAGNOSIS — M6281 Muscle weakness (generalized): Secondary | ICD-10-CM | POA: Diagnosis not present

## 2015-07-06 DIAGNOSIS — M797 Fibromyalgia: Secondary | ICD-10-CM

## 2015-07-06 NOTE — Progress Notes (Signed)
Reason for visit: Nocturnal leg cramps  [name] is an 75 y.o. female  History of present illness:  [name] is a 75 year old right-handed black female with a history of fibromyalgia and nocturnal leg cramps. The patient has been on baclofen taking 20 mg at night, at times she will only take 10 mg. The medication is not completely effective for the leg cramps, she will usually have to get up once or twice at night because of this. The patient indicates that her symptoms have worsened over the last several months. She is having more issues with cramps at night, she feels that the legs are getting week, she has had a fall on occasion because of the weakness. She uses a cane for ambulation. She has fatigue during the day, she oftentimes has to nap. At times she may feel dizzy when she is standing. She has gone off of Crestor, she is on Zetia, but she is not taking this medication on a regular basis. She reports some discomfort in the thighs, she denies any significant back pain or neck discomfort. She denies issues controlling the bowels or the bladder. She returns to this office for an evaluation.  Past Medical History  Diagnosis Date  . Complication of anesthesia     wakes up shaking  . Diabetes mellitus   . Heart murmur     sees Dr. Montez Morita, last seen- early 2012  . Asthma     has used inhaler in past for asthmatic bronchitis, last time- early 2012  . Sleep apnea     borderline sleep apnea, states she no longer uses, early 2012- stopped using   . GERD (gastroesophageal reflux disease)     occas. use of  Prilosec  . Neuromuscular disorder (HCC)     lumbar radiculopathy, lumbago  . Fibromyalgia   . Arthritis   . Hypertension     02/2010- stress test /w PCP  . Anemia     has sickle cell trait  . Nocturnal leg cramps 09/27/2014  . Dyslipidemia   . Sickle cell trait [facility])     Past Surgical History  Procedure Laterality Date  . Back surgery      2012, 2015 (3 total)  .  Abdominal hysterectomy    . Adb.cyst      ovarian cyst  . Eye surgery      macular degeneration treatment - injections  . Ovarian cyst surgery      Family History  Problem Relation Age of Onset  . Anesthesia problems Neg Hx   . Hypotension Neg Hx   . Malignant hyperthermia Neg Hx   . Pseudochol deficiency Neg Hx   . Ovarian cancer Mother   . Cancer - Prostate Father   . Breast cancer Paternal Aunt   . Multiple myeloma Paternal Aunt     Social history:  reports that she quit smoking about 16 years ago. She has never used smokeless tobacco. She reports that she drinks alcohol. She reports that she does not use illicit drugs.    Allergies  Allergen Reactions  . Betadine [Povidone Iodine] Swelling    Reaction to betadine eye drops  . Adhesive [Tape] Rash  . Lipitor [Atorvastatin] Rash    Medications:  Prior to Admission medications   Medication Sig Start Date End Date Taking? Authorizing Provider  Ascorbic Acid (VITAMIN C) 100 MG tablet Take 100 mg by mouth daily.    Historical Provider, MD  aspirin 81 MG EC tablet TK  1 T PO QD 01/16/15   Historical Provider, MD  baclofen (LIORESAL) 20 MG tablet Take 1 tablet (20 mg total) by mouth at bedtime. 02/16/15   Kathrynn Ducking, MD  calcium carbonate (TUMS EX) 750 MG chewable tablet Chew 1 tablet by mouth 2 (two) times daily as needed for heartburn (indigestion).    Historical Provider, MD  cetirizine (ZYRTEC) 10 MG tablet Take 10 mg by mouth daily.    Historical Provider, MD  Dextromethorphan-Guaifenesin (TUSSIN DM PO) Take 5 mLs by mouth daily as needed (cough).    Historical Provider, MD  ezetimibe (ZETIA) 10 MG tablet Take 1 tablet (10 mg total) by mouth daily. 05/04/15   [name], MD  fluticasone (FLONASE) 50 MCG/ACT nasal spray Place 1 spray into both nostrils daily.  12/31/12   Historical Provider, MD  gabapentin (NEURONTIN) 300 MG capsule TAKE ONE CAPSULE BY MOUTH THREE TIMES DAILY 01/13/15   [name], MD  glimepiride (AMARYL)  2 MG tablet TAKE 2 TABLETS BY MOUTH EVERY MORNING AND 2 TABLETS EVERY EVENING 02/16/15   [name], MD  ibuprofen (ADVIL,MOTRIN) 800 MG tablet Take 800 mg by mouth 3 (three) times daily as needed (pain).  01/22/13   Historical Provider, MD  insulin aspart (NOVOLOG FLEXPEN) 100 UNIT/ML FlexPen Inject 6 units before breakfast, 10 units before lunch and 9 units before supper 10/14/14   [name], MD  Insulin Detemir (LEVEMIR FLEXTOUCH) 100 UNIT/ML Pen Inject 8 Units into the skin daily at 10 pm. Patient taking differently: Inject 12 Units into the skin daily at 10 pm.  09/20/14   [name], MD  Insulin Pen Needle (BD PEN NEEDLE NANO U/F) 32G X 4 MM MISC Use as directed 01/23/15   [name], MD  levothyroxine (SYNTHROID, LEVOTHROID) 25 MCG tablet TAKE 1 TABLET(25 MCG) BY MOUTH DAILY BEFORE BREAKFAST 02/01/15   [name], MD  LYRICA 50 MG capsule  05/23/14   Historical Provider, MD  Menthol, Topical Analgesic, (BIOFREEZE ROLL-ON EX) Apply 1 application topically daily as needed (pain).    Historical Provider, MD  Olopatadine HCl 0.6 % SOLN Place into the nose as needed.    Historical Provider, MD  PROAIR RESPICLICK 767 (90 BASE) MCG/ACT AEPB U 2 PUFFS PO Q 4 TO 6 H PRN 01/16/15   Historical Provider, MD  pyridOXINE (VITAMIN B-6) 100 MG tablet Take 100 mg by mouth daily.    Historical Provider, MD  valsartan-hydrochlorothiazide (DIOVAN-HCT) 320-12.5 MG per tablet Take 1 tablet by mouth daily.     Historical Provider, MD  vitamin B-12 (CYANOCOBALAMIN) 100 MCG tablet Take 100 mcg by mouth daily.    Historical Provider, MD  vitamin E 400 UNIT capsule Take 400 Units by mouth daily.    Historical Provider, MD    ROS:  Out of a complete 14 system review of symptoms, the patient complains only of the following symptoms, and all other reviewed systems are negative.  Leg cramps, muscle pain  Fatigue  Blood pressure 120/80, pulse 80, height _0  (1.676 m), weight 173 lb (78.472 kg).   Blood pressure, right  arm, sitting is 124/80. Blood pressure, standing, right arm is 118/80.  Physical Exam  General: The patient is alert and cooperative at the time of the examination. The patient is moderately obese.  Skin: No significant peripheral edema is noted.   Neurologic Exam  Mental status: The patient is alert and oriented x 3 at the time of the examination. The patient has apparent normal recent  and remote memory, with an apparently normal attention span and concentration ability.   Cranial nerves: Facial symmetry is present. Speech is normal, no aphasia or dysarthria is noted. Extraocular movements are full. Visual fields are full.  Motor: The patient has good strength in all 4 extremities, the patient does have some giveaway type weakness with hip flexion bilaterally, no true weakness is seen.  Sensory examination: Soft touch sensation is symmetric on the face, arms, and legs.  Coordination: The patient has good finger-nose-finger and heel-to-shin bilaterally.  Gait and station: The patient has a slightly wide-based gait, the patient may use a cane for ambulation. Tandem gait is slightly unsteady. Romberg is negative, but is unsteady. No drift is seen. The patient is able to rise from a seated position with arms crossed.  Reflexes: Deep tendon reflexes are symmetric.   Assessment/Plan:  1. Nocturnal leg cramps  2. Fibromyalgia  3. Reported leg weakness  The patient believes that she is progressing with her symptoms, she is developing leg weakness and has had falls because of this. The patient will be sent for blood work today, she will be set up for nerve conduction studies on both legs and EMG of one leg. If the studies are unremarkable, we will treat her symptoms of neuromuscular pain. The patient is on baclofen at night, she also takes gabapentin.   Jill Alexanders MD 07/06/2015 1:14 PM  [facility] [address] [place], [place]  [address]  Phone [phone] Fax [phone]

## 2015-07-06 NOTE — Patient Instructions (Signed)

## 2015-07-07 LAB — ACETYLCHOLINE RECEPTOR, BINDING

## 2015-07-07 LAB — SEDIMENTATION RATE: Sed Rate: 13 mm/hr (ref 0–40)

## 2015-07-07 LAB — ANA W/REFLEX: ANA: NEGATIVE

## 2015-07-07 LAB — ANGIOTENSIN CONVERTING ENZYME: ANGIO CONVERT ENZYME: 56 U/L (ref 14–82)

## 2015-07-20 ENCOUNTER — Other Ambulatory Visit: Payer: Self-pay | Admitting: Internal Medicine

## 2015-07-20 DIAGNOSIS — M519 Unspecified thoracic, thoracolumbar and lumbosacral intervertebral disc disorder: Secondary | ICD-10-CM | POA: Diagnosis not present

## 2015-07-20 DIAGNOSIS — Z794 Long term (current) use of insulin: Secondary | ICD-10-CM | POA: Diagnosis not present

## 2015-07-20 DIAGNOSIS — E1142 Type 2 diabetes mellitus with diabetic polyneuropathy: Secondary | ICD-10-CM | POA: Diagnosis not present

## 2015-07-20 DIAGNOSIS — R32 Unspecified urinary incontinence: Secondary | ICD-10-CM | POA: Diagnosis not present

## 2015-07-20 DIAGNOSIS — R269 Unspecified abnormalities of gait and mobility: Secondary | ICD-10-CM

## 2015-07-20 DIAGNOSIS — R252 Cramp and spasm: Secondary | ICD-10-CM | POA: Diagnosis not present

## 2015-07-20 DIAGNOSIS — I10 Essential (primary) hypertension: Secondary | ICD-10-CM | POA: Diagnosis not present

## 2015-07-20 DIAGNOSIS — Z1389 Encounter for screening for other disorder: Secondary | ICD-10-CM | POA: Diagnosis not present

## 2015-07-26 DIAGNOSIS — J3089 Other allergic rhinitis: Secondary | ICD-10-CM | POA: Diagnosis not present

## 2015-07-26 DIAGNOSIS — J4530 Mild persistent asthma, uncomplicated: Secondary | ICD-10-CM | POA: Diagnosis not present

## 2015-07-26 DIAGNOSIS — J301 Allergic rhinitis due to pollen: Secondary | ICD-10-CM | POA: Diagnosis not present

## 2015-07-26 DIAGNOSIS — Z9119 Patient's noncompliance with other medical treatment and regimen: Secondary | ICD-10-CM | POA: Diagnosis not present

## 2015-07-27 ENCOUNTER — Other Ambulatory Visit (INDEPENDENT_AMBULATORY_CARE_PROVIDER_SITE_OTHER): Payer: Medicare Other

## 2015-07-27 DIAGNOSIS — R252 Cramp and spasm: Secondary | ICD-10-CM | POA: Diagnosis not present

## 2015-07-27 DIAGNOSIS — Z794 Long term (current) use of insulin: Secondary | ICD-10-CM | POA: Diagnosis not present

## 2015-07-27 DIAGNOSIS — E1165 Type 2 diabetes mellitus with hyperglycemia: Secondary | ICD-10-CM | POA: Diagnosis not present

## 2015-07-27 DIAGNOSIS — E785 Hyperlipidemia, unspecified: Secondary | ICD-10-CM

## 2015-07-27 LAB — COMPREHENSIVE METABOLIC PANEL
ALBUMIN: 4.3 g/dL (ref 3.5–5.2)
ALK PHOS: 54 U/L (ref 39–117)
ALT: 11 U/L (ref 0–35)
AST: 15 U/L (ref 0–37)
BILIRUBIN TOTAL: 0.6 mg/dL (ref 0.2–1.2)
BUN: 20 mg/dL (ref 6–23)
CALCIUM: 9.9 mg/dL (ref 8.4–10.5)
CO2: 29 meq/L (ref 19–32)
Chloride: 103 mEq/L (ref 96–112)
Creatinine, Ser: 1.02 mg/dL (ref 0.40–1.20)
GFR: 67.83 mL/min (ref >60.00)
Glucose, Bld: 111 mg/dL — ABNORMAL HIGH (ref 70–99)
Potassium: 3.7 mEq/L (ref 3.5–5.1)
Sodium: 141 mEq/L (ref 135–145)
Total Protein: 7 g/dL (ref 6.0–8.3)

## 2015-07-27 LAB — LIPID PANEL
CHOLESTEROL: 277 mg/dL — AB (ref 0–200)
HDL: 53.3 mg/dL (ref >39.00)
LDL Cholesterol: 194 mg/dL — ABNORMAL HIGH (ref 0–99)
NonHDL: 223.9
TRIGLYCERIDES: 150 mg/dL — AB (ref 0.0–149.0)
Total CHOL/HDL Ratio: 5
VLDL: 30 mg/dL (ref 0.0–40.0)

## 2015-07-27 LAB — TSH: TSH: 7.13 u[IU]/mL — AB (ref 0.35–4.50)

## 2015-07-28 LAB — FRUCTOSAMINE: FRUCTOSAMINE: 352 umol/L — AB (ref 0–285)

## 2015-07-31 ENCOUNTER — Encounter: Payer: Medicare Other | Admitting: Neurology

## 2015-08-01 ENCOUNTER — Ambulatory Visit
Admission: RE | Admit: 2015-08-01 | Discharge: 2015-08-01 | Disposition: A | Discharge status: Home / Self Care | Payer: Medicare Other | Source: Ambulatory Visit | Attending: Internal Medicine | Admitting: Internal Medicine

## 2015-08-01 DIAGNOSIS — R269 Unspecified abnormalities of gait and mobility: Secondary | ICD-10-CM | POA: Diagnosis not present

## 2015-08-03 ENCOUNTER — Ambulatory Visit: Payer: Medicare Other | Admitting: Endocrinology

## 2015-08-10 ENCOUNTER — Ambulatory Visit: Payer: Medicare Other | Admitting: Adult Health

## 2015-08-12 ENCOUNTER — Other Ambulatory Visit: Payer: Self-pay | Admitting: Endocrinology

## 2015-08-16 ENCOUNTER — Other Ambulatory Visit: Payer: Self-pay | Admitting: *Deleted

## 2015-08-16 ENCOUNTER — Encounter: Payer: Self-pay | Admitting: Endocrinology

## 2015-08-16 ENCOUNTER — Ambulatory Visit (INDEPENDENT_AMBULATORY_CARE_PROVIDER_SITE_OTHER): Payer: Medicare Other | Admitting: Endocrinology

## 2015-08-16 ENCOUNTER — Telehealth: Payer: Self-pay | Admitting: Endocrinology

## 2015-08-16 VITALS — BP 120/74 | HR 78 | Temp 98.1°F | Resp 14 | Ht 66.0 in | Wt 175.2 lb

## 2015-08-16 DIAGNOSIS — Z794 Long term (current) use of insulin: Secondary | ICD-10-CM

## 2015-08-16 DIAGNOSIS — E1165 Type 2 diabetes mellitus with hyperglycemia: Secondary | ICD-10-CM | POA: Diagnosis not present

## 2015-08-16 MED ORDER — GABAPENTIN 300 MG PO CAPS: 300.0000 mg | ORAL_CAPSULE | Freq: Three times a day (TID) | ORAL | Status: DC

## 2015-08-16 MED ORDER — CANAGLIFLOZIN 100 MG PO TABS: ORAL_TABLET | ORAL | Status: DC

## 2015-08-16 MED ORDER — GLIMEPIRIDE 2 MG PO TABS: ORAL_TABLET | ORAL | Status: DC

## 2015-08-16 NOTE — Telephone Encounter (Signed)
rx sent for Gabapentin and Glimepiride, she's not on Glipizide

## 2015-08-16 NOTE — Telephone Encounter (Signed)
Patient stated the pharmacy haven't received her Glipizide, she is waiting at the pharmacy for it.

## 2015-08-16 NOTE — Patient Instructions (Addendum)
Levemir 12 units steady daily   Reduce Valsartan in 1/2  Invokana 100mg  in am  May need less insulin 2-3 units and stop Glimepeide  Check blood sugars on waking up 2-3  times a week Also check blood sugars about 2 hours after a meal and do this after different meals by rotation  Recommended blood sugar levels on waking up is 90-130 and about 2 hours after meal is 130-160  Please bring your blood sugar monitor to each visit, thank you  Levethyroxine 1/2 daily

## 2015-08-16 NOTE — Progress Notes (Signed)
Patient ID: [name], female   DOB: February 25, 1941, 75 y.o.   MRN: [id number]    Reason for Appointment:    follow-up of various issues  History of Present Illness    PROBLEM 1: Type 2 diabetes mellitus, date of diagnosis: 1998.   Prior history: She had previously been treated with Byetta, Glumetza, Amaryl, Victoza and  Onglyza However because of inadequate control and intolerance to drugs she was finally given pre-meal insulin along with Amaryl, Glumetza eventually stopped because of side effects and also Lantus added  Recent history:  The insulin regimen is: Novolog 3-8 at breakfast and lunch,  9-10 units acs   LEVEMIR insulin 12-15 units daily hs Oral agents: amaryl 4 bid  Her A1c has been mostly between about 6.8 -7.4  Current blood sugar patterns and problems with management:  She has had significant variability in her blood sugars although checking readings mostly in the mornings and midday  She thinks that occasionally blood sugars are high because of her forgetting to take Novolog before a meal  FASTING blood sugars are periodically high and she thinks this is from eating snacks during the night when she is not able to sleep  Usually not checking blood sugars after supper which is her main meal  She thinks she will take more Levemir if her blood sugars are running higher but has had occasional low sugars in the 60s at random times also  She is concerned about her not being him to lose weight  Fructosamine indicates overall high readings  Had previously tried Victoza which caused nausea  Monitors blood glucose: 1-2 times a day, readings as below Glucometer: One Touch.   Mean values apply above for all meters except median for One Touch  PRE-MEAL Fasting Lunch Dinner Overnight  Overall  Glucose range: 65-283  120-251   64-254    Mean/median: 144     143   Meals: 2-3 meals per day at 10 am. 2 pm and 6-8 pm but inconsistent schedule.  Will usually try  to get protein at breakfast.   Physical activity: exercise:  none now   Certified Diabetes Educator visit: Most recent: 7/13.  Dietician visit: Most recent: 3/13.   Wt Readings from Last 3 Encounters:  08/16/15 175 lb 3.2 oz (79.47 kg)  07/06/15 173 lb (78.472 kg)  05/04/15 174 lb 9.6 oz (07.680 kg)   Complications: Neuropathy  LABS:  Lab Results  Component Value Date   HGBA1C 7.4* 04/28/2015   HGBA1C 7.2* 01/26/2015   HGBA1C 6.8* 10/27/2014   Lab Results  Component Value Date   MICROALBUR 4.4* 07/26/2014   LDLCALC 194* 07/27/2015   CREATININE 1.02 07/27/2015    Multiple other issues are addressed in review of systems  No visits with results within 1 Week(s) from this visit. Latest known visit with results is:  Lab on 07/27/2015  Component Date Value Ref Range Status  . Fructosamine 07/27/2015 352* 0 - 285 umol/L Final   Comment: Published reference interval for apparently healthy subjects between age 43 and 5 is 74 - 285 umol/L and in a poorly controlled diabetic population is 228 - 563 umol/L with a mean of 396 umol/L.   Marland Kitchen Sodium 07/27/2015 141  135 - 145 mEq/L Final  . Potassium 07/27/2015 3.7  3.5 - 5.1 mEq/L Final  . Chloride 07/27/2015 103  96 - 112 mEq/L Final  . CO2 07/27/2015 29  19 - 32 mEq/L Final  . Glucose, Bld 07/27/2015  111* 70 - 99 mg/dL Final  . BUN 07/27/2015 20  6 - 23 mg/dL Final  . Creatinine, Ser 07/27/2015 1.02  0.40 - 1.20 mg/dL Final  . Total Bilirubin 07/27/2015 0.6  0.2 - 1.2 mg/dL Final  . Alkaline Phosphatase 07/27/2015 54  39 - 117 U/L Final  . AST 07/27/2015 15  0 - 37 U/L Final  . ALT 07/27/2015 11  0 - 35 U/L Final  . Total Protein 07/27/2015 7.0  6.0 - 8.3 g/dL Final  . Albumin 07/27/2015 4.3  3.5 - 5.2 g/dL Final  . Calcium 07/27/2015 9.9  8.4 - 10.5 mg/dL Final  . GFR 07/27/2015 67.83  >60.00 mL/min Final  . TSH 07/27/2015 7.13* 0.35 - 4.50 uIU/mL Final  . Cholesterol 07/27/2015 277* 0 - 200 mg/dL Final   ATP III  Classification       Desirable:  < 200 mg/dL               Borderline High:  200 - 239 mg/dL          High:  > = 240 mg/dL  . Triglycerides 07/27/2015 150.0* 0.0 - 149.0 mg/dL Final   Normal:  <150 mg/dLBorderline High:  150 - 199 mg/dL  . HDL 07/27/2015 53.30  >39.00 mg/dL Final  . VLDL 07/27/2015 30.0  0.0 - 40.0 mg/dL Final  . LDL Cholesterol 07/27/2015 194* 0 - 99 mg/dL Final  . Total CHOL/HDL Ratio 07/27/2015 5   Final                  Men          Women1/2 Average Risk     3.4          3.3Average Risk          5.0          4.42X Average Risk          9.6          7.13X Average Risk          15.0          11.0                      . NonHDL 07/27/2015 223.90   Final   NOTE:  Non-HDL goal should be 30 mg/dL higher than patient's LDL goal (i.e. LDL goal of < 70 mg/dL, would have non-HDL goal of < 100 mg/dL)        Medication List       This list is accurate as of: 08/16/15  1:29 PM.  Always use your most recent med list.               aspirin 81 MG EC tablet  TK 1 T PO QD     baclofen 20 MG tablet  Commonly known as:  LIORESAL  Take 1 tablet (20 mg total) by mouth at bedtime.     BIOFREEZE ROLL-ON EX  Apply 1 application topically daily as needed (pain).     calcium carbonate 750 MG chewable tablet  Commonly known as:  TUMS EX  Chew 1 tablet by mouth 2 (two) times daily as needed for heartburn (indigestion).     canagliflozin 100 MG Tabs tablet  Commonly known as:  INVOKANA  1 tablet before breakfast     cetirizine 10 MG tablet  Commonly known as:  ZYRTEC  Take 10 mg by mouth daily.     ezetimibe 10  MG tablet  Commonly known as:  ZETIA  Take 1 tablet (10 mg total) by mouth daily.     fluticasone 50 MCG/ACT nasal spray  Commonly known as:  FLONASE  Place 1 spray into both nostrils daily.     gabapentin 300 MG capsule  Commonly known as:  NEURONTIN  Take 1 capsule (300 mg total) by mouth 3 (three) times daily.     glimepiride 2 MG tablet  Commonly known as:   AMARYL  TAKE 2 TABLETS BY MOUTH EVERY MORNING AND 2 TABLETS EVERY EVENING     ibuprofen 800 MG tablet  Commonly known as:  ADVIL,MOTRIN  Take 800 mg by mouth 3 (three) times daily as needed (pain).     insulin aspart 100 UNIT/ML FlexPen  Commonly known as:  NOVOLOG FLEXPEN  Inject 6 units before breakfast, 10 units before lunch and 9 units before supper     Insulin Detemir 100 UNIT/ML Pen  Commonly known as:  LEVEMIR FLEXTOUCH  Inject 8 Units into the skin daily at 10 pm.     Insulin Pen Needle 32G X 4 MM Misc  Commonly known as:  BD PEN NEEDLE NANO U/F  Use as directed     levothyroxine 25 MCG tablet  Commonly known as:  SYNTHROID, LEVOTHROID  TAKE 1 TABLET(25 MCG) BY MOUTH DAILY BEFORE BREAKFAST     LYRICA 50 MG capsule  Generic drug:  pregabalin  Take 50 mg by mouth as needed. Reported on 07/06/2015     Olopatadine HCl 0.6 % Soln  Place into the nose as needed.     PROAIR RESPICLICK 960 (90 Base) MCG/ACT Aepb  Generic drug:  Albuterol Sulfate  U 2 PUFFS PO Q 4 TO 6 H PRN     pyridOXINE 100 MG tablet  Commonly known as:  VITAMIN B-6  Take 100 mg by mouth daily.     TUSSIN DM PO  Take 5 mLs by mouth daily as needed (cough).     valsartan-hydrochlorothiazide 320-12.5 MG tablet  Commonly known as:  DIOVAN-HCT  Take 1 tablet by mouth daily. Reported on 08/16/2015     vitamin B-12 100 MCG tablet  Commonly known as:  CYANOCOBALAMIN  Take 100 mcg by mouth daily.     vitamin C 100 MG tablet  Take 100 mg by mouth daily.     vitamin E 400 UNIT capsule  Take 400 Units by mouth daily.     VOLTAREN 1 % Gel  Generic drug:  diclofenac sodium  APP 3 GRAMS TO 3 LARGE JOINTS UP TO TID PRN        Allergies:  Allergies  Allergen Reactions  . Betadine [Povidone Iodine] Swelling    Reaction to betadine eye drops  . Adhesive [Tape] Rash  . Lipitor [Atorvastatin] Rash    Past Medical History  Diagnosis Date  . Complication of anesthesia     wakes up shaking  .  Diabetes mellitus   . Heart murmur     sees Dr. Montez Morita, last seen- early 2012  . Asthma     has used inhaler in past for asthmatic bronchitis, last time- early 2012  . Sleep apnea     borderline sleep apnea, states she no longer uses, early 2012- stopped using   . GERD (gastroesophageal reflux disease)     occas. use of  Prilosec  . Neuromuscular disorder (HCC)     lumbar radiculopathy, lumbago  . Fibromyalgia   . Arthritis   . Hypertension  02/2010- stress test /w PCP  . Anemia     has sickle cell trait  . Nocturnal leg cramps 09/27/2014  . Dyslipidemia   . Sickle cell trait [facility])     Past Surgical History  Procedure Laterality Date  . Back surgery      2012, 2015 (3 total)  . Abdominal hysterectomy    . Adb.cyst      ovarian cyst  . Eye surgery      macular degeneration treatment - injections  . Ovarian cyst surgery      Family History  Problem Relation Age of Onset  . Anesthesia problems Neg Hx   . Hypotension Neg Hx   . Malignant hyperthermia Neg Hx   . Pseudochol deficiency Neg Hx   . Ovarian cancer Mother   . Cancer - Prostate Father   . Breast cancer Paternal Aunt   . Multiple myeloma Paternal Aunt     Social History:  reports that she quit smoking about 16 years ago. She has never used smokeless tobacco. She reports that she drinks alcohol. She reports that she does not use illicit drugs.  ROS    Leg cramps: She is still having some cramps and not clear of the etiology.   She  has cramps despite not taking any statin drugs although these make them worse  NEUROPATHY: She has had tingling infeet and legs and also has pain going down her legs especially at night. Lyrica is more beneficial than gabapentin for symptom control  Neurology follow-up pending  Diabetic foot exam done in 1/16 Diabetic foot exam shows normal monofilament sensation in the toes and plantar surfaces, no skin lesions or ulcers on the feet and normal pedal pulses  Hyperlipidemia:     The lipid abnormality consists of elevated LDL , high triglyceride and did not tolerate Crestor or lovastatin that have been tried more recently She thinks they make her cramps worse LDL is increased as expected Zetia expensive, did not take this as directed  Lab Results  Component Value Date   CHOL 277* 07/27/2015   HDL 53.30 07/27/2015   LDLCALC 194* 07/27/2015   LDLDIRECT 153.2 10/27/2012   TRIG 150.0* 07/27/2015   CHOLHDL 5 07/27/2015    HYPERTENSION:  Has been present for several years.   Now controlled with taking Diovan HCT Does not monitor at home  Nonspecific fatigue: Does have a Relatively high TSH and was empirically given Synthroid 25 g but she thinks it makes her shaky and not taking it.  Still complaining of fatigue   Lab Results  Component Value Date   TSH 7.13* 07/27/2015   TSH 4.59* 04/28/2015   TSH 5.69* 01/26/2015         Examination:   BP 120/74 mmHg  Pulse 78  Temp(Src) 98.1 F (36.7 C) (Oral)  Resp 14  Ht '5\' 6"'  (1.676 m)  Wt 175 lb 3.2 oz (79.47 kg)  BMI 28.29 kg/m2  SpO2 91%  Body mass index is 28.29 kg/(m^2).      Assesment/Plan:   1.  Hyperlipidemia: Her lipids are poorly controlled without any medications and she is not on any statins because of reported muscle cramps.  She does not think she can afford Zetia She will possibly try Crestor or Lescol XL once her muscle aches and cramps are treated, had taken these before with less side effects  2. DIABETES type 2 with BMI of 28 See history of present illness for detailed discussion of his current management,  blood sugar patterns and problems identified The patient's diabetes control is difficult and she is not able to watch her diet consistently especially with eating during the night Fructosamine indicates overall high readings Also she appears to be adjusting her Levemir frequently which makes her sugar variable  Recommendations: Since she has difficulty losing weight and  consistent control is he will be given a trial of Invokana 100 mg daily. Discussed action of SGLT 2 drugs on lowering glucose by decreasing kidney absorption of glucose, benefits of weight loss and lower blood pressure, possible side effects including candidiasis and dosage regimen   She probably will need less insulin especially Levemir with starting this and she can reduce Levemir to 12 minutes with take the same dose every day  To reduce mealtime doses also by 2-3 units especially when blood sugars are improving  Avoid excessive snacking  Stop glimepiride  3. Hypertension: Blood pressure is controlled on Diovan HCT alone With starting Invokana we'll cut her valsartan in half Will need follow-up BMP  4.  Muscle cramps, joint pains: She will discuss management with neurologist  5.  Increase in TSH: Not clear if she is symptomatic, she has multiple nonspecific symptoms.  Since TSH is higher may well be symptomatic  She is willing to try at least 12.5 g for 2 weeks and then 25 g.  If she has less fatigue may continue this    Patient Instructions  Levemir 12 units steady daily   Reduce Valsartan in 1/2  Invokana 173m in am  May need less insulin 2-3 units and stop Glimepeide  Check blood sugars on waking up 2-3  times a week Also check blood sugars about 2 hours after a meal and do this after different meals by rotation  Recommended blood sugar levels on waking up is 90-130 and about 2 hours after meal is 130-160  Please bring your blood sugar monitor to each visit, thank you  Levethyroxine 1/2 daily       Counseling time on subjects discussed above is over 50% of today's 25 minute visit   [name] 08/16/2015, 1:29 PM       No visits with results within 1 Week(s) from this visit. Latest known visit with results is:  Lab on 07/27/2015  Component Date Value Ref Range Status  . Fructosamine 07/27/2015 352* 0 - 285 umol/L Final   Comment: Published reference  interval for apparently healthy subjects between age 1171and 658is 214- 285 umol/L and in a poorly controlled diabetic population is 228 - 563 umol/L with a mean of 396 umol/L.   .Marland KitchenSodium 07/27/2015 141  135 - 145 mEq/L Final  . Potassium 07/27/2015 3.7  3.5 - 5.1 mEq/L Final  . Chloride 07/27/2015 103  96 - 112 mEq/L Final  . CO2 07/27/2015 29  19 - 32 mEq/L Final  . Glucose, Bld 07/27/2015 111* 70 - 99 mg/dL Final  . BUN 07/27/2015 20  6 - 23 mg/dL Final  . Creatinine, Ser 07/27/2015 1.02  0.40 - 1.20 mg/dL Final  . Total Bilirubin 07/27/2015 0.6  0.2 - 1.2 mg/dL Final  . Alkaline Phosphatase 07/27/2015 54  39 - 117 U/L Final  . AST 07/27/2015 15  0 - 37 U/L Final  . ALT 07/27/2015 11  0 - 35 U/L Final  . Total Protein 07/27/2015 7.0  6.0 - 8.3 g/dL Final  . Albumin 07/27/2015 4.3  3.5 - 5.2 g/dL Final  . Calcium 07/27/2015 9.9  8.4 -  10.5 mg/dL Final  . GFR 07/27/2015 67.83  >60.00 mL/min Final  . TSH 07/27/2015 7.13* 0.35 - 4.50 uIU/mL Final  . Cholesterol 07/27/2015 277* 0 - 200 mg/dL Final   ATP III Classification       Desirable:  < 200 mg/dL               Borderline High:  200 - 239 mg/dL          High:  > = 240 mg/dL  . Triglycerides 07/27/2015 150.0* 0.0 - 149.0 mg/dL Final   Normal:  <150 mg/dLBorderline High:  150 - 199 mg/dL  . HDL 07/27/2015 53.30  >39.00 mg/dL Final  . VLDL 07/27/2015 30.0  0.0 - 40.0 mg/dL Final  . LDL Cholesterol 07/27/2015 194* 0 - 99 mg/dL Final  . Total CHOL/HDL Ratio 07/27/2015 5   Final                  Men          Women1/2 Average Risk     3.4          3.3Average Risk          5.0          4.42X Average Risk          9.6          7.13X Average Risk          15.0          11.0                      . NonHDL 07/27/2015 223.90   Final   NOTE:  Non-HDL goal should be 30 mg/dL higher than patient's LDL goal (i.e. LDL goal of < 70 mg/dL, would have non-HDL goal of < 100 mg/dL)

## 2015-08-21 DIAGNOSIS — Z794 Long term (current) use of insulin: Secondary | ICD-10-CM | POA: Diagnosis not present

## 2015-08-21 DIAGNOSIS — H353131 Nonexudative age-related macular degeneration, bilateral, early dry stage: Secondary | ICD-10-CM | POA: Diagnosis not present

## 2015-08-21 DIAGNOSIS — Z961 Presence of intraocular lens: Secondary | ICD-10-CM | POA: Diagnosis not present

## 2015-08-21 DIAGNOSIS — E119 Type 2 diabetes mellitus without complications: Secondary | ICD-10-CM | POA: Diagnosis not present

## 2015-08-22 ENCOUNTER — Encounter: Payer: Self-pay | Admitting: Neurology

## 2015-08-22 ENCOUNTER — Ambulatory Visit (INDEPENDENT_AMBULATORY_CARE_PROVIDER_SITE_OTHER): Payer: Medicare Other | Admitting: Neurology

## 2015-08-22 ENCOUNTER — Ambulatory Visit (INDEPENDENT_AMBULATORY_CARE_PROVIDER_SITE_OTHER): Payer: Self-pay | Admitting: Neurology

## 2015-08-22 ENCOUNTER — Encounter: Payer: Self-pay | Admitting: *Deleted

## 2015-08-22 DIAGNOSIS — G4762 Sleep related leg cramps: Secondary | ICD-10-CM

## 2015-08-22 DIAGNOSIS — M6281 Muscle weakness (generalized): Secondary | ICD-10-CM

## 2015-08-22 DIAGNOSIS — M797 Fibromyalgia: Secondary | ICD-10-CM

## 2015-08-22 MED ORDER — QUININE SULFATE 324 MG PO CAPS: 324.0000 mg | ORAL_CAPSULE | Freq: Every day | ORAL | Status: DC

## 2015-08-22 NOTE — Progress Notes (Signed)
The patient comes in today for EMG and nerve conduction study evaluation. The patient refused EMG of the left leg. Nerve conduction studies of the legs are normal. We will try a course of quinine at night, if this is not effective, the patient may contact our office. She will follow-up in 4 months.

## 2015-08-22 NOTE — Progress Notes (Signed)
Please refer to EMG and nerve conduction study procedure note. 

## 2015-08-22 NOTE — Procedures (Signed)
     HISTORY:  [name] is a 75 year old patient with a history of nocturnal leg cramps involving both legs. The patient indicates that there is weakness in both legs as well, left greater than right. She has had an occasional fall. She returns for an evaluation.  NERVE CONDUCTION STUDIES:  Nerve conduction studies were performed on both lower extremities. The distal motor latencies and motor amplitudes for the peroneal and posterior tibial nerves were within normal limits. The nerve conduction velocities for these nerves were also normal. The H reflex latencies were normal. The sensory latencies for the peroneal nerves were within normal limits.   EMG STUDIES:  A limited EMG study was performed on the left lower extremity.  The tibialis anterior muscle reveals 2 to 3 K motor units with full recruitment. No fibrillations or positive waves were seen.  The patient refused further EMG study evaluation.   IMPRESSION:  Nerve conduction studies done on both lower extremities were unremarkable. No evidence of a peripheral neuropathy is seen. The patient had a limited EMG evaluation of the left lower extremity, she refused the study after one muscle was evaluated. EMG evaluation is not adequate to evaluate the patient for a myopathic disorder or a lumbar radiculopathy.    Jill Alexanders MD 08/22/2015 1:52 PM  [facility] [address] [place], [place] [address]  Phone [phone] Fax [phone]

## 2015-08-30 DIAGNOSIS — Z780 Asymptomatic menopausal state: Secondary | ICD-10-CM | POA: Diagnosis not present

## 2015-08-30 DIAGNOSIS — M85851 Other specified disorders of bone density and structure, right thigh: Secondary | ICD-10-CM | POA: Diagnosis not present

## 2015-08-30 DIAGNOSIS — Z803 Family history of malignant neoplasm of breast: Secondary | ICD-10-CM | POA: Diagnosis not present

## 2015-08-30 DIAGNOSIS — Z1231 Encounter for screening mammogram for malignant neoplasm of breast: Secondary | ICD-10-CM | POA: Diagnosis not present

## 2015-08-31 ENCOUNTER — Telehealth: Payer: Self-pay | Admitting: Neurology

## 2015-08-31 NOTE — Telephone Encounter (Signed)
I called patient. The quinine has been denied through [employer]. The patient is using tonic water, she will continue the baclofen dosing at night, 20 mg.

## 2015-09-04 ENCOUNTER — Other Ambulatory Visit: Payer: Self-pay | Admitting: *Deleted

## 2015-09-04 MED ORDER — INSULIN ASPART 100 UNIT/ML FLEXPEN: PEN_INJECTOR | SUBCUTANEOUS | Status: DC

## 2015-09-12 ENCOUNTER — Telehealth: Payer: Self-pay | Admitting: Neurology

## 2015-09-12 NOTE — Telephone Encounter (Signed)
Pt called sts since she had NCV/EMG she is still experiencing the same electric shock sensation "tingling" as the day the study was done. sts it is in her legs and she did not have it prior to the study. Please call

## 2015-09-12 NOTE — Telephone Encounter (Signed)
I called patient. The patient indicates that ever since the nerve conduction study at she is had shock sensations from the knees into the calf area. I'm not sure that the procedure would have resulted in any permanent nerve injury. The patient did not have an extensive EMG evaluation, only one muscle was studied before the patient refused. I have offered to see her back in the office to evaluate her, she does not want come in. She will take her gabapentin.

## 2015-09-12 NOTE — Telephone Encounter (Signed)
What would you like me to tell the patient ?  

## 2015-09-19 ENCOUNTER — Other Ambulatory Visit (INDEPENDENT_AMBULATORY_CARE_PROVIDER_SITE_OTHER): Payer: Medicare Other

## 2015-09-19 DIAGNOSIS — Z794 Long term (current) use of insulin: Secondary | ICD-10-CM

## 2015-09-19 DIAGNOSIS — E1165 Type 2 diabetes mellitus with hyperglycemia: Secondary | ICD-10-CM | POA: Diagnosis not present

## 2015-09-19 LAB — COMPREHENSIVE METABOLIC PANEL
ALK PHOS: 47 U/L (ref 39–117)
ALT: 10 U/L (ref 0–35)
AST: 15 U/L (ref 0–37)
Albumin: 4.4 g/dL (ref 3.5–5.2)
BILIRUBIN TOTAL: 0.5 mg/dL (ref 0.2–1.2)
BUN: 20 mg/dL (ref 6–23)
CO2: 31 mEq/L (ref 19–32)
Calcium: 9.6 mg/dL (ref 8.4–10.5)
Chloride: 104 mEq/L (ref 96–112)
Creatinine, Ser: 1.04 mg/dL (ref 0.40–1.20)
GFR: 66.3 mL/min (ref >60.00)
GLUCOSE: 140 mg/dL — AB (ref 70–99)
POTASSIUM: 3.8 meq/L (ref 3.5–5.1)
SODIUM: 141 meq/L (ref 135–145)
TOTAL PROTEIN: 6.7 g/dL (ref 6.0–8.3)

## 2015-09-19 LAB — TSH: TSH: 3.75 u[IU]/mL (ref 0.35–4.50)

## 2015-09-19 LAB — HEMOGLOBIN A1C: HEMOGLOBIN A1C: 7.2 % — AB (ref 4.6–6.5)

## 2015-09-22 ENCOUNTER — Ambulatory Visit: Payer: Medicare Other | Admitting: Endocrinology

## 2015-09-26 ENCOUNTER — Encounter: Payer: Self-pay | Admitting: Endocrinology

## 2015-09-26 ENCOUNTER — Ambulatory Visit (INDEPENDENT_AMBULATORY_CARE_PROVIDER_SITE_OTHER): Payer: Medicare Other | Admitting: Endocrinology

## 2015-09-26 VITALS — BP 126/62 | HR 77 | Temp 98.0°F | Resp 14 | Ht 66.0 in | Wt 173.6 lb

## 2015-09-26 DIAGNOSIS — E038 Other specified hypothyroidism: Secondary | ICD-10-CM

## 2015-09-26 DIAGNOSIS — E063 Autoimmune thyroiditis: Secondary | ICD-10-CM

## 2015-09-26 DIAGNOSIS — E1165 Type 2 diabetes mellitus with hyperglycemia: Secondary | ICD-10-CM | POA: Diagnosis not present

## 2015-09-26 DIAGNOSIS — Z794 Long term (current) use of insulin: Secondary | ICD-10-CM | POA: Diagnosis not present

## 2015-09-26 DIAGNOSIS — I10 Essential (primary) hypertension: Secondary | ICD-10-CM

## 2015-09-26 NOTE — Patient Instructions (Addendum)
14 Levemir daily to keep am sugar 100-130  Upto 12 Novolog at supper  Extra 3-5 Novolog when sugar hi late nite  Improve diet

## 2015-09-26 NOTE — Progress Notes (Signed)
Patient ID: [name], female   DOB: July 29, 1940, 75 y.o.   MRN: [id number]    Reason for Appointment:    follow-up of various issues  History of Present Illness    PROBLEM 1: Type 2 diabetes mellitus, date of diagnosis: 1998.   Prior history: She had previously been treated with Byetta, Glumetza, Amaryl, Victoza and  Onglyza However because of inadequate control and intolerance to drugs she was finally given pre-meal insulin along with Amaryl, Glumetza eventually stopped because of side effects and also Lantus added  Recent history:  The insulin regimen is: Novolog 6-8 at breakfast and lunch,  9-10 units acs   LEVEMIR insulin 12 units daily hs Oral agents: Invokana 100 mg daily  Her A1c has been mostly between about 6.8 -7.4, now 7.2  Current blood sugar patterns and problems with management:  She has had periods of hyperglycemia at various times especially late in the evenings  She says she has not been watching her diet much and eating more liberally as well as having alcoholic drinks in the evenings with her sister visiting.  Periodically will have more snacks late in the evening also  She was told by neurologist to have tonic water at night for her muscle cramps which she thinks some of these drinks were having high sugar content, has changed these recently to lower carbohydrate content  Despite starting [facility] her blood sugars on an average are still high at times after meals and relatively higher fasting also  She did reduce her LEVEMIR with starting Invokana with her fasting readings are averaging about 150  Does not check enough readings after meals  Her weight is down 2 pounds, previously has had difficulty with weight loss  Still not able to do much physical exercise because of her leg pains  Had previously tried Victoza which caused nausea  Monitors blood glucose: 1-2 times a day, readings as below Glucometer: One Touch.   Mean values apply  above for all meters except median for One Touch  PRE-MEAL Fasting Lunch Dinner Bedtime Overall  Glucose range: 127-184  96-221  111-326  130-313    Mean/median:    230  146+/-64     Meals: 2-3 meals per day at 10 am. 2 pm and 6-8 pm but inconsistent schedule.  Will usually try to get protein at breakfast.   Physical activity: exercise:  none    Certified Diabetes Educator visit: Most recent: 7/13.  Dietician visit: Most recent: 3/13.   Wt Readings from Last 3 Encounters:  09/26/15 173 lb 9.6 oz (78.744 kg)  08/16/15 175 lb 3.2 oz (79.47 kg)  07/06/15 173 lb (09.233 kg)   Complications: Neuropathy  LABS:  Lab Results  Component Value Date   HGBA1C 7.2* 09/19/2015   HGBA1C 7.4* 04/28/2015   HGBA1C 7.2* 01/26/2015   Lab Results  Component Value Date   MICROALBUR 4.4* 07/26/2014   LDLCALC 194* 07/27/2015   CREATININE 1.04 09/19/2015    Multiple other issues are addressed in review of systems  No visits with results within 1 Week(s) from this visit. Latest known visit with results is:  Lab on 09/19/2015  Component Date Value Ref Range Status  . Hgb A1c MFr Bld 09/19/2015 7.2* 4.6 - 6.5 % Final   Glycemic Control Guidelines for People with Diabetes:Non Diabetic:  <6%Goal of Therapy: <7%Additional Action Suggested:  >8%   . Sodium 09/19/2015 141  135 - 145 mEq/L Final  . Potassium 09/19/2015 3.8  3.5 - 5.1 mEq/L Final  . Chloride 09/19/2015 104  96 - 112 mEq/L Final  . CO2 09/19/2015 31  19 - 32 mEq/L Final  . Glucose, Bld 09/19/2015 140* 70 - 99 mg/dL Final  . BUN 09/19/2015 20  6 - 23 mg/dL Final  . Creatinine, Ser 09/19/2015 1.04  0.40 - 1.20 mg/dL Final  . Total Bilirubin 09/19/2015 0.5  0.2 - 1.2 mg/dL Final  . Alkaline Phosphatase 09/19/2015 47  39 - 117 U/L Final  . AST 09/19/2015 15  0 - 37 U/L Final  . ALT 09/19/2015 10  0 - 35 U/L Final  . Total Protein 09/19/2015 6.7  6.0 - 8.3 g/dL Final  . Albumin 09/19/2015 4.4  3.5 - 5.2 g/dL Final  . Calcium  09/19/2015 9.6  8.4 - 10.5 mg/dL Final  . GFR 09/19/2015 66.30  >60.00 mL/min Final  . TSH 09/19/2015 3.75  0.35 - 4.50 uIU/mL Final        Medication List       This list is accurate as of: 09/26/15  9:34 PM.  Always use your most recent med list.               aspirin 81 MG EC tablet  TK 1 T PO QD     baclofen 20 MG tablet  Commonly known as:  LIORESAL  Take 1 tablet (20 mg total) by mouth at bedtime.     BIOFREEZE ROLL-ON EX  Apply 1 application topically daily as needed (pain).     calcium carbonate 750 MG chewable tablet  Commonly known as:  TUMS EX  Chew 1 tablet by mouth 2 (two) times daily as needed for heartburn (indigestion).     canagliflozin 100 MG Tabs tablet  Commonly known as:  INVOKANA  1 tablet before breakfast     cetirizine 10 MG tablet  Commonly known as:  ZYRTEC  Take 10 mg by mouth daily.     ezetimibe 10 MG tablet  Commonly known as:  ZETIA  Take 1 tablet (10 mg total) by mouth daily.     fluticasone 50 MCG/ACT nasal spray  Commonly known as:  FLONASE  Place 1 spray into both nostrils daily.     gabapentin 300 MG capsule  Commonly known as:  NEURONTIN  Take 1 capsule (300 mg total) by mouth 3 (three) times daily.     ibuprofen 800 MG tablet  Commonly known as:  ADVIL,MOTRIN  Take 800 mg by mouth 3 (three) times daily as needed (pain).     insulin aspart 100 UNIT/ML FlexPen  Commonly known as:  NOVOLOG FLEXPEN  Inject 6 units before breakfast, 10 units before lunch and 9 units before supper     Insulin Detemir 100 UNIT/ML Pen  Commonly known as:  LEVEMIR FLEXTOUCH  Inject 8 Units into the skin daily at 10 pm.     Insulin Pen Needle 32G X 4 MM Misc  Commonly known as:  BD PEN NEEDLE NANO U/F  Use as directed     levothyroxine 25 MCG tablet  Commonly known as:  SYNTHROID, LEVOTHROID  TAKE 1 TABLET(25 MCG) BY MOUTH DAILY BEFORE BREAKFAST     LYRICA 50 MG capsule  Generic drug:  pregabalin  Take 50 mg by mouth as needed.  Reported on 07/06/2015     Olopatadine HCl 0.6 % Soln  Place into the nose as needed.     PROAIR RESPICLICK 329 (90 Base) MCG/ACT Aepb  Generic drug:  Albuterol Sulfate  U 2 PUFFS PO Q 4 TO 6 H PRN     pyridOXINE 100 MG tablet  Commonly known as:  VITAMIN B-6  Take 100 mg by mouth daily.     TUSSIN DM PO  Take 5 mLs by mouth daily as needed (cough). Reported on 09/26/2015     valsartan-hydrochlorothiazide 320-12.5 MG tablet  Commonly known as:  DIOVAN-HCT  Take 1 tablet by mouth daily. Reported on 08/16/2015     vitamin B-12 100 MCG tablet  Commonly known as:  CYANOCOBALAMIN  Take 100 mcg by mouth daily.     vitamin C 100 MG tablet  Take 100 mg by mouth daily.     vitamin E 400 UNIT capsule  Take 400 Units by mouth daily.     VOLTAREN 1 % Gel  Generic drug:  diclofenac sodium  APP 3 GRAMS TO 3 LARGE JOINTS UP TO TID PRN        Allergies:  Allergies  Allergen Reactions  . Betadine [Povidone Iodine] Swelling    Reaction to betadine eye drops  . Adhesive [Tape] Rash  . Lipitor [Atorvastatin] Rash    Past Medical History  Diagnosis Date  . Complication of anesthesia     wakes up shaking  . Diabetes mellitus   . Heart murmur     sees Dr. Montez Morita, last seen- early 2012  . Asthma     has used inhaler in past for asthmatic bronchitis, last time- early 2012  . Sleep apnea     borderline sleep apnea, states she no longer uses, early 2012- stopped using   . GERD (gastroesophageal reflux disease)     occas. use of  Prilosec  . Neuromuscular disorder (HCC)     lumbar radiculopathy, lumbago  . Fibromyalgia   . Arthritis   . Hypertension     02/2010- stress test /w PCP  . Anemia     has sickle cell trait  . Nocturnal leg cramps 09/27/2014  . Dyslipidemia   . Sickle cell trait [facility])     Past Surgical History  Procedure Laterality Date  . Back surgery      2012, 2015 (3 total)  . Abdominal hysterectomy    . Adb.cyst      ovarian cyst  . Eye surgery       macular degeneration treatment - injections  . Ovarian cyst surgery      Family History  Problem Relation Age of Onset  . Anesthesia problems Neg Hx   . Hypotension Neg Hx   . Malignant hyperthermia Neg Hx   . Pseudochol deficiency Neg Hx   . Ovarian cancer Mother   . Cancer - Prostate Father   . Breast cancer Paternal Aunt   . Multiple myeloma Paternal Aunt     Social History:  reports that she quit smoking about 16 years ago. She has never used smokeless tobacco. She reports that she drinks alcohol. She reports that she does not use illicit drugs.  ROS    Leg cramps: She is still having some cramps and not clear of the etiology.   She  has cramps despite not taking any statin drugs although these make them worse  NEUROPATHY: She has had tingling infeet and legs and also has pain going down her legs especially at night. Lyrica is more beneficial than gabapentin for symptom control  Neurology follow-up pending  Diabetic foot exam done in 1/16 Diabetic foot exam shows normal monofilament sensation in the toes  and plantar surfaces, no skin lesions or ulcers on the feet and normal pedal pulses  Hyperlipidemia:   The lipid abnormality consists of elevated LDL , high triglyceride and did not tolerate Crestor or lovastatin She thinks they make her cramps worse LDL is increased as expected Zetia was expensive, did not take this when prescribed Still having some nonspecific muscle cramps  Lab Results  Component Value Date   CHOL 277* 07/27/2015   HDL 53.30 07/27/2015   LDLCALC 194* 07/27/2015   LDLDIRECT 153.2 10/27/2012   TRIG 150.0* 07/27/2015   CHOLHDL 5 07/27/2015    HYPERTENSION:  Has been present for several years.   Now controlled with taking Diovan HCT, Recently half tablet with starting Invokana Does not monitor at home  Lab Results  Component Value Date   CREATININE 1.04 09/19/2015   BUN 20 09/19/2015   NA 141 09/19/2015   K 3.8 09/19/2015   CL 104  09/19/2015   CO2 31 09/19/2015     Nonspecific fatigue: Does have a relatively high TSH as of 4/17 and was empirically given Synthroid 25 g, Half tablet daily Still complaining of fatigue but she also has had other issues and stress TSH is improved and she is not having any shakiness now as she did with 25 g   Lab Results  Component Value Date   TSH 3.75 09/19/2015   TSH 7.13* 07/27/2015   TSH 4.59* 04/28/2015         Examination:   BP 126/62 mmHg  Pulse 77  Temp(Src) 98 F (36.7 C)  Resp 14  Ht '5\' 6"'  (1.676 m)  Wt 173 lb 9.6 oz (78.744 kg)  BMI 28.03 kg/m2  SpO2 98%  Body mass index is 28.03 kg/(m^2).      Assesment/Plan:   1.DIABETES type 2 with BMI of 28 See history of present illness for detailed discussion of his current management, blood sugar patterns and problems identified The patient's diabetes control is difficult especially recently with her not watching her diet particularly in the evenings at meals with snacks and some alcoholic drinks causing hyperglycemia Not clear if she is benefiting unit from [facility] as blood sugars are still periodically significantly high and she has not needed a reduction in insulin dose; however has lost 2 pounds  Recommendations:  Since she may do better with improving her diet consistently Will continue Invokana for now and not restart Amaryl  Increase Levemir by at least 2 units and bring morning sugar down below 130 more consistently  May take 12-13 units for higher fat or larger meals at suppertime  Cut back on snacks, high-fat meals and martinis  More blood sugars after supper  May take a correction dose of 3-5 units Novolog late at night for very high readings over 250  3. Hypertension: Blood pressure is controlled on Diovan HCT half tablet, dose was reduced with Invokana and she can continue this Renal function stable with starting Invokana   4.    Increase in TSH: Not clear if she is symptomatic, she has  better TSH with 12.5 g levothyroxine and will continue for now.  She thinks her fatigue is from various other reasons    Patient Instructions  14 Levemir daily to keep am sugar 100-130  Upto 12 Novolog at supper  Extra 3-5 Novolog when sugar hi late nite  Improve diet     Counseling time on subjects discussed above is over 50% of today's 25 minute visit   Chavonne Sforza 09/26/2015,  9:34 PM       No visits with results within 1 Week(s) from this visit. Latest known visit with results is:  Lab on 09/19/2015  Component Date Value Ref Range Status  . Hgb A1c MFr Bld 09/19/2015 7.2* 4.6 - 6.5 % Final   Glycemic Control Guidelines for People with Diabetes:Non Diabetic:  <6%Goal of Therapy: <7%Additional Action Suggested:  >8%   . Sodium 09/19/2015 141  135 - 145 mEq/L Final  . Potassium 09/19/2015 3.8  3.5 - 5.1 mEq/L Final  . Chloride 09/19/2015 104  96 - 112 mEq/L Final  . CO2 09/19/2015 31  19 - 32 mEq/L Final  . Glucose, Bld 09/19/2015 140* 70 - 99 mg/dL Final  . BUN 09/19/2015 20  6 - 23 mg/dL Final  . Creatinine, Ser 09/19/2015 1.04  0.40 - 1.20 mg/dL Final  . Total Bilirubin 09/19/2015 0.5  0.2 - 1.2 mg/dL Final  . Alkaline Phosphatase 09/19/2015 47  39 - 117 U/L Final  . AST 09/19/2015 15  0 - 37 U/L Final  . ALT 09/19/2015 10  0 - 35 U/L Final  . Total Protein 09/19/2015 6.7  6.0 - 8.3 g/dL Final  . Albumin 09/19/2015 4.4  3.5 - 5.2 g/dL Final  . Calcium 09/19/2015 9.6  8.4 - 10.5 mg/dL Final  . GFR 09/19/2015 66.30  >60.00 mL/min Final  . TSH 09/19/2015 3.75  0.35 - 4.50 uIU/mL Final

## 2015-10-19 ENCOUNTER — Other Ambulatory Visit: Payer: Self-pay | Admitting: Neurology

## 2015-10-26 ENCOUNTER — Telehealth: Payer: Self-pay | Admitting: Endocrinology

## 2015-10-26 NOTE — Telephone Encounter (Signed)
Need to know what side effects she was having from [employer] [facility]

## 2015-10-26 NOTE — Telephone Encounter (Signed)
PT wanted to tell you stopped the Invokana due to side effects, she is back to taking Glimeperide with shots in AM and PM and things seem to be doing well with that.

## 2015-10-26 NOTE — Telephone Encounter (Signed)
Requested a call back from the pt to verify the symptoms she had been having.

## 2015-11-07 ENCOUNTER — Telehealth: Payer: Self-pay | Admitting: Endocrinology

## 2015-11-07 NOTE — Telephone Encounter (Signed)
PT wanting to know why Dr. Dwyane Dee stopped her Gabapentin?

## 2015-11-07 NOTE — Telephone Encounter (Signed)
I contacted the pt and advised of note below via vm  

## 2015-11-07 NOTE — Telephone Encounter (Signed)
She is taking Lyrica instead

## 2015-11-07 NOTE — Telephone Encounter (Signed)
See below and please advise, Thanks!  

## 2015-11-13 ENCOUNTER — Other Ambulatory Visit: Payer: Self-pay

## 2015-11-13 ENCOUNTER — Telehealth: Payer: Self-pay | Admitting: Endocrinology

## 2015-11-13 MED ORDER — GABAPENTIN 300 MG PO CAPS: 300.0000 mg | ORAL_CAPSULE | Freq: Three times a day (TID) | ORAL | 5 refills | Status: DC

## 2015-11-13 NOTE — Telephone Encounter (Signed)
PT stated that she is not taking the Lyrica consistently, only in extreme cases when her Fibromyalgia bothers her.  She stated the Gabapentin is the only thing that helps with her Diabetic Neuropathy and that now that she was taken off of it, she cannot sleep at all.

## 2015-11-13 NOTE — Telephone Encounter (Signed)
We can refill her gabapentin

## 2015-11-23 ENCOUNTER — Other Ambulatory Visit (INDEPENDENT_AMBULATORY_CARE_PROVIDER_SITE_OTHER): Payer: Medicare Other

## 2015-11-23 DIAGNOSIS — Z794 Long term (current) use of insulin: Secondary | ICD-10-CM

## 2015-11-23 DIAGNOSIS — E1165 Type 2 diabetes mellitus with hyperglycemia: Secondary | ICD-10-CM | POA: Diagnosis not present

## 2015-11-23 LAB — COMPREHENSIVE METABOLIC PANEL
ALBUMIN: 4.2 g/dL (ref 3.5–5.2)
ALK PHOS: 50 U/L (ref 39–117)
ALT: 10 U/L (ref 0–35)
AST: 13 U/L (ref 0–37)
BILIRUBIN TOTAL: 0.4 mg/dL (ref 0.2–1.2)
BUN: 15 mg/dL (ref 6–23)
CALCIUM: 9.6 mg/dL (ref 8.4–10.5)
CO2: 32 meq/L (ref 19–32)
CREATININE: 0.95 mg/dL (ref 0.40–1.20)
Chloride: 104 mEq/L (ref 96–112)
GFR: 73.57 mL/min (ref >60.00)
Glucose, Bld: 124 mg/dL — ABNORMAL HIGH (ref 70–99)
Potassium: 3.5 mEq/L (ref 3.5–5.1)
Sodium: 143 mEq/L (ref 135–145)
TOTAL PROTEIN: 6.9 g/dL (ref 6.0–8.3)

## 2015-11-23 LAB — LIPID PANEL
CHOL/HDL RATIO: 4
Cholesterol: 182 mg/dL (ref 0–200)
HDL: 47.3 mg/dL (ref >39.00)
LDL Cholesterol: 111 mg/dL — ABNORMAL HIGH (ref 0–99)
NonHDL: 134.56
TRIGLYCERIDES: 117 mg/dL (ref 0.0–149.0)
VLDL: 23.4 mg/dL (ref 0.0–40.0)

## 2015-11-24 LAB — FRUCTOSAMINE: FRUCTOSAMINE: 281 umol/L (ref 0–285)

## 2015-11-27 ENCOUNTER — Encounter: Payer: Self-pay | Admitting: Endocrinology

## 2015-11-27 ENCOUNTER — Ambulatory Visit (INDEPENDENT_AMBULATORY_CARE_PROVIDER_SITE_OTHER): Payer: Medicare Other | Admitting: Endocrinology

## 2015-11-27 VITALS — BP 136/74 | HR 78 | Ht 66.0 in | Wt 178.0 lb

## 2015-11-27 DIAGNOSIS — E038 Other specified hypothyroidism: Secondary | ICD-10-CM

## 2015-11-27 DIAGNOSIS — E1165 Type 2 diabetes mellitus with hyperglycemia: Secondary | ICD-10-CM

## 2015-11-27 DIAGNOSIS — E063 Autoimmune thyroiditis: Secondary | ICD-10-CM

## 2015-11-27 NOTE — Progress Notes (Signed)
Patient ID: [name], female   DOB: Oct 18, 1940, 75 y.o.   MRN: [id number]    Reason for Appointment:    follow-up of various issues  History of Present Illness    PROBLEM 1: Type 2 diabetes mellitus, date of diagnosis: 1998.   Prior history: She had previously been treated with Byetta, Glumetza, Amaryl, Victoza and  Onglyza However because of inadequate control and intolerance to drugs she was finally given pre-meal insulin along with Amaryl, Glumetza eventually stopped because of side effects and also Lantus added  Recent history:  The insulin regimen is: Novolog 6-8 at breakfast and lunch,  9-10 units acs   LEVEMIR insulin 14 units daily hs Oral agents: None  Her A1c has been mostly between about 6.8 -7.4, last 7.2 Fructosamine is reasonably good at 281 now  Current blood sugar patterns and problems with management:  She has had fewer periods of hyperglycemia   She will have sporadic high readings at various times especially late in the evening with noncompliant with diet especially snacks like potato chips  She probably did have a steroid injection in her knee about 3 weeks ago and had high readings for about 3 days  Fasting readings are more consistent and better with 14 Levemir instead of 12  She is not checking her blood sugars after meals consistently and mostly before her first meal which is at variable times  No hypoglycemia reported, lowest reading 67 overnight  Some of her readings later in the evenings are okay but occasionally high  She is taking about the same amount of insulin for lunch and dinner with only one or 2 unit adjustment occasionally for what she is eating  Had previously tried Victoza which caused nausea  Monitors blood glucose: 1-2 times a day, readings as below Glucometer: One Touch.   Mean values apply above for all meters except median for One Touch  PRE-MEAL Fasting Lunch Dinner Bedtime Overall  Glucose range: 101-209        Mean/median: 135    150  139    Meals: 2-3 meals per day at 10 am. 2 pm and 6-8 pm but inconsistent schedule.  Will usually try to get protein at breakfast.   Physical activity: exercise:  none, Not able to    Certified Diabetes Educator visit: Most recent: 7/13.  Dietician visit: Most recent: 3/13.   Wt Readings from Last 3 Encounters:  11/27/15 178 lb (80.7 kg)  09/26/15 173 lb 9.6 oz (78.7 kg)  08/16/15 175 lb 3.2 oz (41.9 kg)   Complications: Neuropathy  LABS:  Lab Results  Component Value Date   HGBA1C 7.2 (H) 09/19/2015   HGBA1C 7.4 (H) 04/28/2015   HGBA1C 7.2 (H) 01/26/2015   Lab Results  Component Value Date   MICROALBUR 4.4 (H) 07/26/2014   LDLCALC 111 (H) 11/23/2015   CREATININE 0.95 11/23/2015    Multiple other issues are addressed in review of systems  Lab on 11/23/2015  Component Date Value Ref Range Status  . Sodium 11/23/2015 143  135 - 145 mEq/L Final  . Potassium 11/23/2015 3.5  3.5 - 5.1 mEq/L Final  . Chloride 11/23/2015 104  96 - 112 mEq/L Final  . CO2 11/23/2015 32  19 - 32 mEq/L Final  . Glucose, Bld 11/23/2015 124* 70 - 99 mg/dL Final  . BUN 11/23/2015 15  6 - 23 mg/dL Final  . Creatinine, Ser 11/23/2015 0.95  0.40 - 1.20 mg/dL Final  . Total Bilirubin  11/23/2015 0.4  0.2 - 1.2 mg/dL Final  . Alkaline Phosphatase 11/23/2015 50  39 - 117 U/L Final  . AST 11/23/2015 13  0 - 37 U/L Final  . ALT 11/23/2015 10  0 - 35 U/L Final  . Total Protein 11/23/2015 6.9  6.0 - 8.3 g/dL Final  . Albumin 11/23/2015 4.2  3.5 - 5.2 g/dL Final  . Calcium 11/23/2015 9.6  8.4 - 10.5 mg/dL Final  . GFR 11/23/2015 73.57  >60.00 mL/min Final  . Fructosamine 11/24/2015 281  0 - 285 umol/L Final   Comment: Published reference interval for apparently healthy subjects between age 49 and 68 is 84 - 285 umol/L and in a poorly controlled diabetic population is 228 - 563 umol/L with a mean of 396 umol/L.   Marland Kitchen Cholesterol 11/23/2015 182  0 - 200 mg/dL Final  .  Triglycerides 11/23/2015 117.0  0.0 - 149.0 mg/dL Final  . HDL 11/23/2015 47.30  >39.00 mg/dL Final  . VLDL 11/23/2015 23.4  0.0 - 40.0 mg/dL Final  . LDL Cholesterol 11/23/2015 111* 0 - 99 mg/dL Final  . Total CHOL/HDL Ratio 11/23/2015 4   Final  . NonHDL 11/23/2015 134.56   Final        Medication List       Accurate as of 11/27/15  3:04 PM. Always use your most recent med list.          aspirin 81 MG EC tablet TK 1 T PO QD   baclofen 10 MG tablet Commonly known as:  LIORESAL TAKE 1 TABLET(10 MG) BY MOUTH AT BEDTIME   BIOFREEZE ROLL-ON EX Apply 1 application topically daily as needed (pain).   calcium carbonate 750 MG chewable tablet Commonly known as:  TUMS EX Chew 1 tablet by mouth 2 (two) times daily as needed for heartburn (indigestion).   cetirizine 10 MG tablet Commonly known as:  ZYRTEC Take 10 mg by mouth daily.   ezetimibe 10 MG tablet Commonly known as:  ZETIA Take 1 tablet (10 mg total) by mouth daily.   fluticasone 50 MCG/ACT nasal spray Commonly known as:  FLONASE Place 1 spray into both nostrils daily.   gabapentin 300 MG capsule Commonly known as:  NEURONTIN Take 1 capsule (300 mg total) by mouth 3 (three) times daily.   ibuprofen 800 MG tablet Commonly known as:  ADVIL,MOTRIN Take 800 mg by mouth 3 (three) times daily as needed (pain).   insulin aspart 100 UNIT/ML FlexPen Commonly known as:  NOVOLOG FLEXPEN Inject 6 units before breakfast, 10 units before lunch and 9 units before supper   Insulin Detemir 100 UNIT/ML Pen Commonly known as:  LEVEMIR FLEXTOUCH Inject 8 Units into the skin daily at 10 pm.   Insulin Pen Needle 32G X 4 MM Misc Commonly known as:  BD PEN NEEDLE NANO U/F Use as directed   levothyroxine 25 MCG tablet Commonly known as:  SYNTHROID, LEVOTHROID TAKE 1 TABLET(25 MCG) BY MOUTH DAILY BEFORE BREAKFAST   LYRICA 50 MG capsule Generic drug:  pregabalin Take 50 mg by mouth as needed. Reported on 07/06/2015     Olopatadine HCl 0.6 % Soln Place into the nose as needed.   PROAIR RESPICLICK 517 (90 Base) MCG/ACT Aepb Generic drug:  Albuterol Sulfate U 2 PUFFS PO Q 4 TO 6 H PRN   pyridOXINE 100 MG tablet Commonly known as:  VITAMIN B-6 Take 100 mg by mouth daily.   TUSSIN DM PO Take 5 mLs by mouth daily as needed (  cough). Reported on 09/26/2015   valsartan-hydrochlorothiazide 320-12.5 MG tablet Commonly known as:  DIOVAN-HCT Take 1 tablet by mouth daily. Reported on 08/16/2015   vitamin B-12 100 MCG tablet Commonly known as:  CYANOCOBALAMIN Take 100 mcg by mouth daily.   vitamin C 100 MG tablet Take 100 mg by mouth daily.   vitamin E 400 UNIT capsule Take 400 Units by mouth daily.   VOLTAREN 1 % Gel Generic drug:  diclofenac sodium APP 3 GRAMS TO 3 LARGE JOINTS UP TO TID PRN       Allergies:  Allergies  Allergen Reactions  . Betadine [Povidone Iodine] Swelling    Reaction to betadine eye drops  . Adhesive [Tape] Rash  . Lipitor [Atorvastatin] Rash    Past Medical History:  Diagnosis Date  . Anemia    has sickle cell trait  . Arthritis   . Asthma    has used inhaler in past for asthmatic bronchitis, last time- early 2012  . Complication of anesthesia    wakes up shaking  . Diabetes mellitus   . Dyslipidemia   . Fibromyalgia   . GERD (gastroesophageal reflux disease)    occas. use of  Prilosec  . Heart murmur    sees Dr. Montez Morita, last seen- early 2012  . Hypertension    02/2010- stress test /w PCP  . Neuromuscular disorder (HCC)    lumbar radiculopathy, lumbago  . Nocturnal leg cramps 09/27/2014  . Sickle cell trait ([place])   . Sleep apnea    borderline sleep apnea, states she no longer uses, early 2012- stopped using     Past Surgical History:  Procedure Laterality Date  . ABDOMINAL HYSTERECTOMY    . adb.cyst     ovarian cyst  . BACK SURGERY     2012, 2015 (3 total)  . EYE SURGERY     macular degeneration treatment - injections  . OVARIAN CYST SURGERY       Family History  Problem Relation Age of Onset  . Anesthesia problems Neg Hx   . Hypotension Neg Hx   . Malignant hyperthermia Neg Hx   . Pseudochol deficiency Neg Hx   . Ovarian cancer Mother   . Cancer - Prostate Father   . Breast cancer Paternal Aunt   . Multiple myeloma Paternal Aunt     Social History:  reports that she quit smoking about 16 years ago. She has never used smokeless tobacco. She reports that she drinks alcohol. She reports that she does not use drugs.  ROS    NEUROPATHY: She has had tingling infeet and legs and also has pain going down her legs especially at night. Although she was previously taking Lyrica she thinks she is taking mostly gabapentin with some relief She thinks she takes Lyrica only as needed for fibromyalgia   Diabetic foot exam done in 9/16 Diabetic foot exam shows normal monofilament sensation in the toes and plantar surfaces, no skin lesions or ulcers on the feet and normal pedal pulses  Hyperlipidemia:   The lipid abnormality consists of elevated LDL , high triglyceride and did not tolerate Crestor or lovastatin She thinks they make her cramps worse LDL is increased as expected  Zetia was expensive, however she is recently taking this intermittently with small doses when she can afford it LDL is relatively better even with inadequate dosage Still having some nonspecific muscle cramps  Lab Results  Component Value Date   CHOL 182 11/23/2015   HDL 47.30 11/23/2015  LDLCALC 111 (H) 11/23/2015   LDLDIRECT 153.2 10/27/2012   TRIG 117.0 11/23/2015   CHOLHDL 4 11/23/2015    HYPERTENSION:  Has been present for several years.   His controlled with taking Diovan HCT Does  monitor at home  Lab Results  Component Value Date   CREATININE 0.95 11/23/2015   BUN 15 11/23/2015   NA 143 11/23/2015   K 3.5 11/23/2015   CL 104 11/23/2015   CO2 32 11/23/2015     Nonspecific fatigue: Does have a relatively high TSH as of 4/17 and was  empirically given Synthroid 25 g, Half tablet daily TSH is improved and she is not having any shakiness now as she did with 25 g   Lab Results  Component Value Date   TSH 3.75 09/19/2015   TSH 7.13 (H) 07/27/2015   TSH 4.59 (H) 04/28/2015         Examination:   BP 136/74 (BP Location: Left Arm, Patient Position: Sitting, Cuff Size: Normal)   Pulse 78   Ht '5\' 6"'  (1.676 m)   Wt 178 lb (80.7 kg)   SpO2 96%   BMI 28.73 kg/m   Body mass index is 28.73 kg/m.      Assesment/Plan:   1.DIABETES type 2 with BMI of 28 See history of present illness for detailed discussion of his current management, blood sugar patterns and problems identified She is taking insulin only in basal bolus regimen She is doing fairly well considering her difficulty with not being able to exercise, inconsistency in her diet and snacks and other factors Fructosamine is upper normal Last A1c 7.2  Recommendations:  No change in basic insulin regimen  More readings after meals  3. Hypertension: Blood pressure is controlled on Diovan HCT    4.    Increase in TSH: Not clear if she is symptomatic, she has better TSH with 12.5 g levothyroxine and will continue for now. Recheck TSH on the next visit   5.  Hyperlipidemia: She previously had significant increase in LDL, now LDL is down to 111 with intermittent use of Zetia, she can continue this  There are no Patient Instructions on file for this visit.     Brae Schaafsma 11/27/2015, 3:04 PM       Lab on 11/23/2015  Component Date Value Ref Range Status  . Sodium 11/23/2015 143  135 - 145 mEq/L Final  . Potassium 11/23/2015 3.5  3.5 - 5.1 mEq/L Final  . Chloride 11/23/2015 104  96 - 112 mEq/L Final  . CO2 11/23/2015 32  19 - 32 mEq/L Final  . Glucose, Bld 11/23/2015 124* 70 - 99 mg/dL Final  . BUN 11/23/2015 15  6 - 23 mg/dL Final  . Creatinine, Ser 11/23/2015 0.95  0.40 - 1.20 mg/dL Final  . Total Bilirubin 11/23/2015 0.4  0.2 - 1.2 mg/dL Final    . Alkaline Phosphatase 11/23/2015 50  39 - 117 U/L Final  . AST 11/23/2015 13  0 - 37 U/L Final  . ALT 11/23/2015 10  0 - 35 U/L Final  . Total Protein 11/23/2015 6.9  6.0 - 8.3 g/dL Final  . Albumin 11/23/2015 4.2  3.5 - 5.2 g/dL Final  . Calcium 11/23/2015 9.6  8.4 - 10.5 mg/dL Final  . GFR 11/23/2015 73.57  >60.00 mL/min Final  . Fructosamine 11/24/2015 281  0 - 285 umol/L Final   Comment: Published reference interval for apparently healthy subjects between age 53 and 5 is 57 - 285 umol/L and  in a poorly controlled diabetic population is 228 - 563 umol/L with a mean of 396 umol/L.   Marland Kitchen Cholesterol 11/23/2015 182  0 - 200 mg/dL Final  . Triglycerides 11/23/2015 117.0  0.0 - 149.0 mg/dL Final  . HDL 11/23/2015 47.30  >39.00 mg/dL Final  . VLDL 11/23/2015 23.4  0.0 - 40.0 mg/dL Final  . LDL Cholesterol 11/23/2015 111* 0 - 99 mg/dL Final  . Total CHOL/HDL Ratio 11/23/2015 4   Final  . NonHDL 11/23/2015 134.56   Final

## 2015-11-30 ENCOUNTER — Encounter: Payer: Self-pay | Admitting: Endocrinology

## 2015-11-30 ENCOUNTER — Telehealth: Payer: Self-pay | Admitting: Endocrinology

## 2015-11-30 DIAGNOSIS — M797 Fibromyalgia: Secondary | ICD-10-CM | POA: Diagnosis not present

## 2015-11-30 DIAGNOSIS — M25561 Pain in right knee: Secondary | ICD-10-CM | POA: Diagnosis not present

## 2015-11-30 DIAGNOSIS — M19241 Secondary osteoarthritis, right hand: Secondary | ICD-10-CM | POA: Diagnosis not present

## 2015-11-30 DIAGNOSIS — R5381 Other malaise: Secondary | ICD-10-CM | POA: Diagnosis not present

## 2015-11-30 NOTE — Telephone Encounter (Signed)
Patient got another steroid shot today, and also added medication Cymbalta.let her know what to do

## 2015-12-04 NOTE — Telephone Encounter (Signed)
Please find out if she is having problems with her blood sugars.   Was unable to address this problem 4 days ago since I was out of the office

## 2015-12-05 NOTE — Telephone Encounter (Signed)
Patient stated that her b/s is doing better it is ni the 200dreds this morning it was 124. Thanks for checking.

## 2015-12-05 NOTE — Telephone Encounter (Signed)
See below to be advised.  

## 2015-12-05 NOTE — Telephone Encounter (Signed)
Requested a call back from the pt to discuss.  

## 2015-12-19 DIAGNOSIS — H3561 Retinal hemorrhage, right eye: Secondary | ICD-10-CM | POA: Diagnosis not present

## 2015-12-19 DIAGNOSIS — H353212 Exudative age-related macular degeneration, right eye, with inactive choroidal neovascularization: Secondary | ICD-10-CM | POA: Diagnosis not present

## 2015-12-19 DIAGNOSIS — H353114 Nonexudative age-related macular degeneration, right eye, advanced atrophic with subfoveal involvement: Secondary | ICD-10-CM | POA: Diagnosis not present

## 2015-12-19 DIAGNOSIS — H353122 Nonexudative age-related macular degeneration, left eye, intermediate dry stage: Secondary | ICD-10-CM | POA: Diagnosis not present

## 2015-12-24 ENCOUNTER — Other Ambulatory Visit: Payer: Self-pay | Admitting: Endocrinology

## 2016-01-22 DIAGNOSIS — I10 Essential (primary) hypertension: Secondary | ICD-10-CM | POA: Diagnosis not present

## 2016-01-22 DIAGNOSIS — Z7984 Long term (current) use of oral hypoglycemic drugs: Secondary | ICD-10-CM | POA: Diagnosis not present

## 2016-01-22 DIAGNOSIS — E7800 Pure hypercholesterolemia, unspecified: Secondary | ICD-10-CM | POA: Diagnosis not present

## 2016-01-22 DIAGNOSIS — Z0000 Encounter for general adult medical examination without abnormal findings: Secondary | ICD-10-CM | POA: Diagnosis not present

## 2016-01-22 DIAGNOSIS — M519 Unspecified thoracic, thoracolumbar and lumbosacral intervertebral disc disorder: Secondary | ICD-10-CM | POA: Diagnosis not present

## 2016-01-22 DIAGNOSIS — M810 Age-related osteoporosis without current pathological fracture: Secondary | ICD-10-CM | POA: Diagnosis not present

## 2016-01-22 DIAGNOSIS — Z794 Long term (current) use of insulin: Secondary | ICD-10-CM | POA: Diagnosis not present

## 2016-01-22 DIAGNOSIS — Z23 Encounter for immunization: Secondary | ICD-10-CM | POA: Diagnosis not present

## 2016-01-22 DIAGNOSIS — D573 Sickle-cell trait: Secondary | ICD-10-CM | POA: Diagnosis not present

## 2016-01-22 DIAGNOSIS — E1142 Type 2 diabetes mellitus with diabetic polyneuropathy: Secondary | ICD-10-CM | POA: Diagnosis not present

## 2016-01-22 DIAGNOSIS — Z1389 Encounter for screening for other disorder: Secondary | ICD-10-CM | POA: Diagnosis not present

## 2016-01-22 DIAGNOSIS — H3530 Unspecified macular degeneration: Secondary | ICD-10-CM | POA: Diagnosis not present

## 2016-02-16 ENCOUNTER — Ambulatory Visit (INDEPENDENT_AMBULATORY_CARE_PROVIDER_SITE_OTHER): Payer: Medicare Other | Admitting: Neurology

## 2016-02-16 ENCOUNTER — Encounter: Payer: Self-pay | Admitting: Neurology

## 2016-02-16 VITALS — BP 132/80 | HR 72 | Ht 66.0 in | Wt 176.0 lb

## 2016-02-16 DIAGNOSIS — R413 Other amnesia: Secondary | ICD-10-CM

## 2016-02-16 DIAGNOSIS — M797 Fibromyalgia: Secondary | ICD-10-CM

## 2016-02-16 DIAGNOSIS — G4762 Sleep related leg cramps: Secondary | ICD-10-CM

## 2016-02-16 DIAGNOSIS — E538 Deficiency of other specified B group vitamins: Secondary | ICD-10-CM | POA: Diagnosis not present

## 2016-02-16 HISTORY — DX: Other amnesia: R41.3

## 2016-02-16 NOTE — Progress Notes (Signed)
Reason for visit: Nocturnal leg cramps  [name] is an 75 y.o. female  History of present illness:  [name] is a 75 year old right-handed black female with a history of fibromyalgia, nocturnal leg cramps, and some the reports of a mild memory disturbance. The patient has been on baclofen taking 10 mg at night, she rarely takes any more. The patient has recently gone on some potassium supplementation and she feels that this has been beneficial. She continues to have leg cramps almost every night. She does not have a regular exercise regimen for the fibromyalgia. She takes gabapentin for this. The patient returns for an evaluation. She has recently noted some problems with short-term memory, mild problems with remembering names for things, word finding. She denies any significant problems with directions with driving. She will sometimes forget what it is she is going to do. She has not given up any activities of daily living because of memory.  Past Medical History:  Diagnosis Date  . Anemia    has sickle cell trait  . Arthritis   . Asthma    has used inhaler in past for asthmatic bronchitis, last time- early 2012  . Complication of anesthesia    wakes up shaking  . Diabetes mellitus   . Dyslipidemia   . Fibromyalgia   . GERD (gastroesophageal reflux disease)    occas. use of  Prilosec  . Heart murmur    sees Dr. Montez Morita, last seen- early 2012  . Hypertension    02/2010- stress test /w PCP  . Neuromuscular disorder (HCC)    lumbar radiculopathy, lumbago  . Nocturnal leg cramps 09/27/2014  . Sickle cell trait ([place])   . Sleep apnea    borderline sleep apnea, states she no longer uses, early 2012- stopped using     Past Surgical History:  Procedure Laterality Date  . ABDOMINAL HYSTERECTOMY    . adb.cyst     ovarian cyst  . BACK SURGERY     2012, 2015 (3 total)  . EYE SURGERY     macular degeneration treatment - injections  . OVARIAN CYST SURGERY      Family  History  Problem Relation Age of Onset  . Ovarian cancer Mother   . Cancer - Prostate Father   . Breast cancer Paternal Aunt   . Multiple myeloma Paternal Aunt   . Anesthesia problems Neg Hx   . Hypotension Neg Hx   . Malignant hyperthermia Neg Hx   . Pseudochol deficiency Neg Hx     Social history:  reports that she quit smoking about 16 years ago. She has never used smokeless tobacco. She reports that she drinks alcohol. She reports that she does not use drugs.    Allergies  Allergen Reactions  . Betadine [Povidone Iodine] Swelling    Reaction to betadine eye drops  . Adhesive [Tape] Rash  . Lipitor [Atorvastatin] Rash    Medications:  Prior to Admission medications   Medication Sig Start Date End Date Taking? Authorizing Provider  Ascorbic Acid (VITAMIN C) 100 MG tablet Take 100 mg by mouth daily.   Yes Historical Provider, MD  aspirin 81 MG EC tablet TK 1 T PO QD 01/16/15  Yes Historical Provider, MD  baclofen (LIORESAL) 10 MG tablet TAKE 1 TABLET(10 MG) BY MOUTH AT BEDTIME 10/19/15  Yes Kathrynn Ducking, MD  calcium carbonate (TUMS EX) 750 MG chewable tablet Chew 1 tablet by mouth 2 (two) times daily as needed for heartburn (indigestion).  Yes Historical Provider, MD  cetirizine (ZYRTEC) 10 MG tablet Take 10 mg by mouth daily.   Yes Historical Provider, MD  Dextromethorphan-Guaifenesin (TUSSIN DM PO) Take 5 mLs by mouth daily as needed (cough). Reported on 09/26/2015   Yes Historical Provider, MD  ezetimibe (ZETIA) 10 MG tablet Take 1 tablet (10 mg total) by mouth daily. 05/04/15  Yes [name], MD  fluticasone (FLONASE) 50 MCG/ACT nasal spray Place 1 spray into both nostrils daily.  12/31/12  Yes Historical Provider, MD  gabapentin (NEURONTIN) 300 MG capsule Take 1 capsule (300 mg total) by mouth 3 (three) times daily. 11/13/15  Yes [name], MD  glimepiride (AMARYL) 2 MG tablet  01/23/16  Yes Historical Provider, MD  ibuprofen (ADVIL,MOTRIN) 800 MG tablet Take 800 mg by mouth 3  (three) times daily as needed (pain).  01/22/13  Yes Historical Provider, MD  insulin aspart (NOVOLOG FLEXPEN) 100 UNIT/ML FlexPen Inject 6 units before breakfast, 10 units before lunch and 9 units before supper 09/04/15  Yes [name], MD  Insulin Pen Needle (BD PEN NEEDLE NANO U/F) 32G X 4 MM MISC Use as directed 01/23/15  Yes [name], MD  LEVEMIR FLEXTOUCH 100 UNIT/ML Pen INJECT 8 UNITS INTO THE SKIN DAILY AT 10PM 12/26/15  Yes [name], MD  levothyroxine (SYNTHROID, LEVOTHROID) 25 MCG tablet TAKE 1 TABLET(25 MCG) BY MOUTH DAILY BEFORE BREAKFAST Patient taking differently: TAKE 1/2 TABLET BY MOUTH DAILY BEFORE BREAKFAST 02/01/15  Yes [name], MD  LYRICA 50 MG capsule Take 50 mg by mouth as needed. Reported on 07/06/2015 05/23/14  Yes Historical Provider, MD  Menthol, Topical Analgesic, (BIOFREEZE ROLL-ON EX) Apply 1 application topically daily as needed (pain).   Yes Historical Provider, MD  Olopatadine HCl 0.6 % SOLN Place into the nose as needed.   Yes Historical Provider, MD  Potassium 99 MG TABS Take 1 tablet by mouth 2 (two) times daily.   Yes Historical Provider, MD  PROAIR RESPICLICK 465 (90 BASE) MCG/ACT AEPB U 2 PUFFS PO Q 4 TO 6 H PRN 01/16/15  Yes Historical Provider, MD  pyridOXINE (VITAMIN B-6) 100 MG tablet Take 100 mg by mouth daily.   Yes Historical Provider, MD  valsartan-hydrochlorothiazide (DIOVAN-HCT) 320-12.5 MG per tablet Take 1 tablet by mouth daily. Reported on 08/16/2015   Yes Historical Provider, MD  vitamin B-12 (CYANOCOBALAMIN) 100 MCG tablet Take 100 mcg by mouth daily.   Yes Historical Provider, MD  vitamin E 400 UNIT capsule Take 400 Units by mouth daily.   Yes Historical Provider, MD  VOLTAREN 1 % GEL APP 3 GRAMS TO 3 LARGE JOINTS UP TO TID PRN 06/05/15  Yes Historical Provider, MD    ROS:  Out of a complete 14 system review of symptoms, the patient complains only of the following symptoms, and all other reviewed systems are negative.  Hearing loss, difficulty  swallowing Chest pain Swollen abdomen Frequent waking, daytime sleepiness, snoring Memory loss, dizziness, headache, weakness  Blood pressure 132/80, pulse 72, height '5\' 6"'  (1.676 m), weight 176 lb (79.8 kg).  Physical Exam  General: The patient is alert and cooperative at the time of the examination. The patient is minimally obese.  Skin: No significant peripheral edema is noted.   Neurologic Exam  Mental status: The patient is alert and oriented x 3 at the time of the examination. The patient has apparent normal recent and remote memory, with an apparently normal attention span and concentration ability. The Mini-Mental Status Examination done today shows  a total score of 29/30.   Cranial nerves: Facial symmetry is present. Speech is normal, no aphasia or dysarthria is noted. Extraocular movements are full. Visual fields are full.  Motor: The patient has good strength in all 4 extremities.  Sensory examination: Soft touch sensation is symmetric on the face, arms, and legs.  Coordination: The patient has good finger-nose-finger and heel-to-shin bilaterally.  Gait and station: The patient has a normal gait. Tandem gait is normal. Romberg is negative. No drift is seen.  Reflexes: Deep tendon reflexes are symmetric.   Assessment/Plan:  1. Fibromyalgia  2. Nocturnal leg cramps  3. Reported mild memory disturbance  The patient will be sent for blood work today, we will follow the memory issues over time. The patient will continue the baclofen, she will follow-up in 9 months, sooner if needed.  Jill Alexanders MD 02/16/2016 11:26 AM  [facility] [address] [place], [place] [address]  Phone [phone] Fax [phone]

## 2016-02-17 LAB — RPR: RPR: NONREACTIVE

## 2016-02-17 LAB — VITAMIN B12: Vitamin B-12: 1280 pg/mL — ABNORMAL HIGH (ref 211–946)

## 2016-02-22 ENCOUNTER — Other Ambulatory Visit (INDEPENDENT_AMBULATORY_CARE_PROVIDER_SITE_OTHER): Payer: Medicare Other

## 2016-02-22 DIAGNOSIS — E038 Other specified hypothyroidism: Secondary | ICD-10-CM

## 2016-02-22 DIAGNOSIS — E063 Autoimmune thyroiditis: Secondary | ICD-10-CM

## 2016-02-22 DIAGNOSIS — E1165 Type 2 diabetes mellitus with hyperglycemia: Secondary | ICD-10-CM | POA: Diagnosis not present

## 2016-02-22 LAB — BASIC METABOLIC PANEL
BUN: 16 mg/dL (ref 6–23)
CALCIUM: 9.9 mg/dL (ref 8.4–10.5)
CO2: 29 meq/L (ref 19–32)
Chloride: 105 mEq/L (ref 96–112)
Creatinine, Ser: 0.92 mg/dL (ref 0.40–1.20)
GFR: 76.29 mL/min (ref >60.00)
GLUCOSE: 140 mg/dL — AB (ref 70–99)
Potassium: 3.8 mEq/L (ref 3.5–5.1)
SODIUM: 142 meq/L (ref 135–145)

## 2016-02-22 LAB — HEMOGLOBIN A1C: Hgb A1c MFr Bld: 6.9 % — ABNORMAL HIGH (ref 4.6–6.5)

## 2016-02-22 LAB — MICROALBUMIN / CREATININE URINE RATIO
Creatinine,U: 71.6 mg/dL
MICROALB UR: 1.9 mg/dL (ref 0.0–1.9)
Microalb Creat Ratio: 2.7 mg/g (ref 0.0–30.0)

## 2016-02-22 LAB — T4, FREE: Free T4: 0.78 ng/dL (ref 0.60–1.60)

## 2016-02-22 LAB — TSH: TSH: 3.62 u[IU]/mL (ref 0.35–4.50)

## 2016-02-27 ENCOUNTER — Ambulatory Visit (INDEPENDENT_AMBULATORY_CARE_PROVIDER_SITE_OTHER): Payer: Medicare Other | Admitting: Endocrinology

## 2016-02-27 ENCOUNTER — Encounter: Payer: Self-pay | Admitting: Endocrinology

## 2016-02-27 VITALS — BP 128/82 | HR 80 | Ht 66.0 in | Wt 177.0 lb

## 2016-02-27 DIAGNOSIS — I10 Essential (primary) hypertension: Secondary | ICD-10-CM

## 2016-02-27 DIAGNOSIS — E1165 Type 2 diabetes mellitus with hyperglycemia: Secondary | ICD-10-CM

## 2016-02-27 DIAGNOSIS — R252 Cramp and spasm: Secondary | ICD-10-CM

## 2016-02-27 DIAGNOSIS — Z794 Long term (current) use of insulin: Secondary | ICD-10-CM | POA: Diagnosis not present

## 2016-02-27 DIAGNOSIS — E038 Other specified hypothyroidism: Secondary | ICD-10-CM | POA: Diagnosis not present

## 2016-02-27 DIAGNOSIS — E782 Mixed hyperlipidemia: Secondary | ICD-10-CM

## 2016-02-27 DIAGNOSIS — E063 Autoimmune thyroiditis: Secondary | ICD-10-CM

## 2016-02-27 NOTE — Progress Notes (Signed)
Patient ID: [name], female   DOB: Jun 14, 1940, 75 y.o.   MRN: [id number]    Reason for Appointment:    follow-up  History of Present Illness    PROBLEM 1: Type 2 diabetes mellitus, date of diagnosis: 1998.   Prior history: She had previously been treated with Byetta, Glumetza, Amaryl, Victoza and  Onglyza However because of inadequate control and intolerance to drugs she was finally given pre-meal insulin along with Amaryl, Glumetza eventually stopped because of side effects and also Lantus added  Recent history:  The insulin regimen is: Novolog 6-8 at breakfast and lunch,  9-10 units acs   LEVEMIR insulin 14 units daily hs Oral agents: None  Her A1c has been mostly between about 6.8 -7.4,  Now 6.9   Current blood sugar patterns and problems with management:  She has  Overall better control but her blood sugars are fluctuating  Most of her blood sugar fluctuation is related to inconsistent diet and snacking especially at night.   however more recently her fasting readings appear to be relatively higher insistently   she gets up in the night or as a snack late at night especially if she is having muscle cramps. She tends to eat apples and chips   she has however started doing some water aerobics.  She will have sporadic high readings at various times especially late in the evening with noncompliant with diet especially snacks like potato chips   She is trying to adjust insulin somewhat based on what she is eating at mealtimes plan is compliant with her Levemir every night  Had previously tried Victoza which caused nausea  Monitors blood glucose: 1-2 times a day, readings as below Glucometer: One Touch.   Mean values apply above for all meters except median for One Touch  PRE-MEAL Fasting Lunch Dinner Bedtime Overall  Glucose range:  122-221   125, 217  89- 188  100- 221   Mean/median:  150    153    Meals: 2-3 meals per day at 10 am. 2 pm and 6-8 pm  but inconsistent schedule.  Will usually try to get protein at breakfast.    Physical activity: exercise:  Water aerobics currently 2 times a week  Certified Diabetes Educator visit: Most recent: 7/13.  Dietician visit: Most recent: 3/13.   Wt Readings from Last 3 Encounters:  02/27/16 177 lb (80.3 kg)  02/16/16 176 lb (79.8 kg)  11/27/15 178 lb ([id number] kg)   Complications: Neuropathy  LABS:  Lab Results  Component Value Date   HGBA1C 6.9 (H) 02/22/2016   HGBA1C 7.2 (H) 09/19/2015   HGBA1C 7.4 (H) 04/28/2015   Lab Results  Component Value Date   MICROALBUR 1.9 02/22/2016   LDLCALC 111 (H) 11/23/2015   CREATININE 0.92 02/22/2016    Multiple other issues are addressed in review of systems  Lab on 02/22/2016  Component Date Value Ref Range Status  . Hgb A1c MFr Bld 02/22/2016 6.9* 4.6 - 6.5 % Final  . Sodium 02/22/2016 142  135 - 145 mEq/L Final  . Potassium 02/22/2016 3.8  3.5 - 5.1 mEq/L Final  . Chloride 02/22/2016 105  96 - 112 mEq/L Final  . CO2 02/22/2016 29  19 - 32 mEq/L Final  . Glucose, Bld 02/22/2016 140* 70 - 99 mg/dL Final  . BUN 02/22/2016 16  6 - 23 mg/dL Final  . Creatinine, Ser 02/22/2016 0.92  0.40 - 1.20 mg/dL Final  . Calcium 02/22/2016 9.9  8.4 - 10.5 mg/dL Final  . GFR 02/22/2016 76.29  >60.00 mL/min Final  . TSH 02/22/2016 3.62  0.35 - 4.50 uIU/mL Final  . Free T4 02/22/2016 0.78  0.60 - 1.60 ng/dL Final   Comment: Specimens from patients who are undergoing biotin therapy and /or ingesting biotin supplements may contain high levels of biotin.  The higher biotin concentration in these specimens interferes with this Free T4 assay.  Specimens that contain high levels  of biotin may cause false high results for this Free T4 assay.  Please interpret results in light of the total clinical presentation of the patient.    Jacquelyne Balint, Ur 02/22/2016 1.9  0.0 - 1.9 mg/dL Final  . Creatinine,U 02/22/2016 71.6  mg/dL Final  . Microalb Creat Ratio 02/22/2016 2.7   0.0 - 30.0 mg/g Final        Medication List       Accurate as of 02/27/16 11:59 PM. Always use your most recent med list.          aspirin 81 MG EC tablet TK 1 T PO QD   baclofen 10 MG tablet Commonly known as:  LIORESAL TAKE 1 TABLET(10 MG) BY MOUTH AT BEDTIME   BIOFREEZE ROLL-ON EX Apply 1 application topically daily as needed (pain).   calcium carbonate 750 MG chewable tablet Commonly known as:  TUMS EX Chew 1 tablet by mouth 2 (two) times daily as needed for heartburn (indigestion).   cetirizine 10 MG tablet Commonly known as:  ZYRTEC Take 10 mg by mouth daily.   ezetimibe 10 MG tablet Commonly known as:  ZETIA Take 1 tablet (10 mg total) by mouth daily.   fluticasone 50 MCG/ACT nasal spray Commonly known as:  FLONASE Place 1 spray into both nostrils daily.   gabapentin 300 MG capsule Commonly known as:  NEURONTIN Take 1 capsule (300 mg total) by mouth 3 (three) times daily.   glimepiride 2 MG tablet Commonly known as:  AMARYL   ibuprofen 800 MG tablet Commonly known as:  ADVIL,MOTRIN Take 800 mg by mouth 3 (three) times daily as needed (pain).   insulin aspart 100 UNIT/ML FlexPen Commonly known as:  NOVOLOG FLEXPEN Inject 6 units before breakfast, 10 units before lunch and 9 units before supper   Insulin Pen Needle 32G X 4 MM Misc Commonly known as:  BD PEN NEEDLE NANO U/F Use as directed   LEVEMIR FLEXTOUCH 100 UNIT/ML Pen Generic drug:  Insulin Detemir INJECT 8 UNITS INTO THE SKIN DAILY AT 10PM   levothyroxine 25 MCG tablet Commonly known as:  SYNTHROID, LEVOTHROID TAKE 1 TABLET(25 MCG) BY MOUTH DAILY BEFORE BREAKFAST   LYRICA 50 MG capsule Generic drug:  pregabalin Take 50 mg by mouth as needed. Reported on 07/06/2015   Olopatadine HCl 0.6 % Soln Place into the nose as needed.   Potassium 99 MG Tabs Take 1 tablet by mouth 2 (two) times daily.   PROAIR RESPICLICK 123XX123 (90 Base) MCG/ACT Aepb Generic drug:  Albuterol Sulfate U 2 PUFFS  PO Q 4 TO 6 H PRN   pyridOXINE 100 MG tablet Commonly known as:  VITAMIN B-6 Take 100 mg by mouth daily.   TUSSIN DM PO Take 5 mLs by mouth daily as needed (cough). Reported on 09/26/2015   valsartan-hydrochlorothiazide 320-12.5 MG tablet Commonly known as:  DIOVAN-HCT Take 1 tablet by mouth daily. Reported on 08/16/2015   vitamin B-12 100 MCG tablet Commonly known as:  CYANOCOBALAMIN Take 100 mcg by mouth daily.  vitamin C 100 MG tablet Take 100 mg by mouth daily.   vitamin E 400 UNIT capsule Take 400 Units by mouth daily.   VOLTAREN 1 % Gel Generic drug:  diclofenac sodium APP 3 GRAMS TO 3 LARGE JOINTS UP TO TID PRN       Allergies:  Allergies  Allergen Reactions  . Betadine [Povidone Iodine] Swelling    Reaction to betadine eye drops  . Adhesive [Tape] Rash  . Lipitor [Atorvastatin] Rash    Past Medical History:  Diagnosis Date  . Anemia    has sickle cell trait  . Arthritis   . Asthma    has used inhaler in past for asthmatic bronchitis, last time- early 2012  . Complication of anesthesia    wakes up shaking  . Diabetes mellitus   . Dyslipidemia   . Fibromyalgia   . GERD (gastroesophageal reflux disease)    occas. use of  Prilosec  . Heart murmur    sees Dr. Montez Morita, last seen- early 2012  . Hypertension    02/2010- stress test /w PCP  . Neuromuscular disorder (HCC)    lumbar radiculopathy, lumbago  . Nocturnal leg cramps 09/27/2014  . Sickle cell trait ([place])   . Sleep apnea    borderline sleep apnea, states she no longer uses, early 2012- stopped using     Past Surgical History:  Procedure Laterality Date  . ABDOMINAL HYSTERECTOMY    . adb.cyst     ovarian cyst  . BACK SURGERY     2012, 2015 (3 total)  . EYE SURGERY     macular degeneration treatment - injections  . OVARIAN CYST SURGERY      Family History  Problem Relation Age of Onset  . Ovarian cancer Mother   . Cancer - Prostate Father   . Breast cancer Paternal Aunt   . Multiple  myeloma Paternal Aunt   . Anesthesia problems Neg Hx   . Hypotension Neg Hx   . Malignant hyperthermia Neg Hx   . Pseudochol deficiency Neg Hx     Social History:  reports that she quit smoking about 16 years ago. She has never used smokeless tobacco. She reports that she drinks alcohol. She reports that she does not use drugs.  ROS    NEUROPATHY: She has had tingling infeet and legs and also has pain going down her legs especially at night. Although she was previously taking Lyrica she thinks she is taking mostly gabapentin with fair amount of relief She thinks she takes Lyrica only as needed for fibromyalgia  Diabetic foot exam done in 9/16 Diabetic foot exam shows normal monofilament sensation in the toes and plantar surfaces, no skin lesions or ulcers on the feet and normal pedal pulses  Hyperlipidemia:   The lipid abnormality consists of elevated LDL , high triglyceride and did not tolerate Crestor or lovastatin She thinks they make her cramps worse - she also thinks Zetia makes her grams worse LDL is increased as expected  Zetia was expensive, however she is recently taking this  LDL is better     Lab Results  Component Value Date   CHOL 182 11/23/2015   HDL 47.30 11/23/2015   LDLCALC 111 (H) 11/23/2015   LDLDIRECT 153.2 10/27/2012   TRIG 117.0 11/23/2015   CHOLHDL 4 11/23/2015     MUSCLE cramps: she is taking baclofen without adequate relief, has tried OTC remedies and tonic water.  she thinks  Cramps are relatively better  HYPERTENSION:  Has been present for several years.   His controlled with taking Diovan HCT Does monitor at home Takes otc  Potassium since she was told by her PCP her level was low  Lab Results  Component Value Date   CREATININE 0.92 02/22/2016   BUN 16 02/22/2016   NA 142 02/22/2016   K 3.8 02/22/2016   CL 105 02/22/2016   CO2 29 02/22/2016     Nonspecific fatigue: Does have a relatively high TSH as of 4/17 and was empirically  given Synthroid 25 g, Half tablet daily TSH is improved and she is not having any shakiness now as she did with 25 g  has less fatigue now  Lab Results  Component Value Date   TSH 3.62 02/22/2016   TSH 3.75 09/19/2015   TSH 7.13 (H) 07/27/2015   FREET4 0.78 02/22/2016         Examination:   BP 128/82   Pulse 80   Ht 5\' 6"  (1.676 m)   Wt 177 lb (80.3 kg)   SpO2 93%   BMI 28.57 kg/m   Body mass index is 28.57 kg/m.      Assesment/Plan:   1.DIABETES type 2 with BMI of 28 See history of present illness for detailed discussion of his current management, blood sugar patterns and problems identified She is taking insulin using a basal bolus regimen Although her A1c is 6.9 she has fluctuation in her blood sugars Has some difficulty estimating her mealtime insulin even discussed looking at carbohydrates She has started exercising and encouraged her to do more  She does need to cut out extra snacks late at night and during the night, she should take 2-4 units Humalog if she is eating a large snack at night Also since she has mostly high fasting readings she can increase Levemir to 16 for now. Advised her to have protein at each meal also  3. Hypertension: Blood pressure is controlled on Diovan HCT, she can continue to follow-up with PCP for hypokalemia, currently improved with OTC potassium    4.    Increase in TSH: Since she is doing somewhat better symptomatically with 12.5 g levothyroxine she can continue the same dose not TSH again normal   5.  Muscle cramps: Advised her to do stretching exercises of her lower legs before going to bed.  Hyperlipidemia: She can continue Zetia, not clear why she is getting cramps from this She agrees to take this   Patient Instructions  Watch diet and snacks  Check blood sugars on waking up  3-4x weeks  Also check blood sugars about 2 hours after a meal and do this after different meals by rotation  Recommended blood sugar levels  on waking up is 90-130 and about 2 hours after meal is 130-160  Please bring your blood sugar monitor to each visit, thank you  16 Levemir      Counseling time on subjects discussed above is over 50% of today's 25 minute visit   Mays Paino 02/28/2016, 8:02 AM       Lab on 02/22/2016  Component Date Value Ref Range Status  . Hgb A1c MFr Bld 02/22/2016 6.9* 4.6 - 6.5 % Final  . Sodium 02/22/2016 142  135 - 145 mEq/L Final  . Potassium 02/22/2016 3.8  3.5 - 5.1 mEq/L Final  . Chloride 02/22/2016 105  96 - 112 mEq/L Final  . CO2 02/22/2016 29  19 - 32 mEq/L Final  . Glucose, Bld 02/22/2016 140* 70 - 99  mg/dL Final  . BUN 02/22/2016 16  6 - 23 mg/dL Final  . Creatinine, Ser 02/22/2016 0.92  0.40 - 1.20 mg/dL Final  . Calcium 02/22/2016 9.9  8.4 - 10.5 mg/dL Final  . GFR 02/22/2016 76.29  >60.00 mL/min Final  . TSH 02/22/2016 3.62  0.35 - 4.50 uIU/mL Final  . Free T4 02/22/2016 0.78  0.60 - 1.60 ng/dL Final   Comment: Specimens from patients who are undergoing biotin therapy and /or ingesting biotin supplements may contain high levels of biotin.  The higher biotin concentration in these specimens interferes with this Free T4 assay.  Specimens that contain high levels  of biotin may cause false high results for this Free T4 assay.  Please interpret results in light of the total clinical presentation of the patient.    Jacquelyne Balint, Ur 02/22/2016 1.9  0.0 - 1.9 mg/dL Final  . Creatinine,U 02/22/2016 71.6  mg/dL Final  . Microalb Creat Ratio 02/22/2016 2.7  0.0 - 30.0 mg/g Final

## 2016-02-27 NOTE — Patient Instructions (Addendum)
Watch diet and snacks  Check blood sugars on waking up  3-4x weeks  Also check blood sugars about 2 hours after a meal and do this after different meals by rotation  Recommended blood sugar levels on waking up is 90-130 and about 2 hours after meal is 130-160  Please bring your blood sugar monitor to each visit, thank you  16 Levemir

## 2016-03-19 ENCOUNTER — Ambulatory Visit: Payer: Medicare Other | Admitting: Rheumatology

## 2016-03-21 ENCOUNTER — Other Ambulatory Visit: Payer: Self-pay | Admitting: Endocrinology

## 2016-03-25 ENCOUNTER — Other Ambulatory Visit: Payer: Self-pay | Admitting: Neurology

## 2016-03-26 DIAGNOSIS — M19049 Primary osteoarthritis, unspecified hand: Secondary | ICD-10-CM | POA: Insufficient documentation

## 2016-03-26 DIAGNOSIS — G4709 Other insomnia: Secondary | ICD-10-CM | POA: Insufficient documentation

## 2016-03-26 DIAGNOSIS — M170 Bilateral primary osteoarthritis of knee: Secondary | ICD-10-CM | POA: Insufficient documentation

## 2016-03-26 HISTORY — DX: Bilateral primary osteoarthritis of knee: M17.0

## 2016-03-26 NOTE — Progress Notes (Signed)
Office Visit Note  Patient: [name]             Date of Birth: 07/07/1940           MRN: [id number]             PCP: Wenda Low, MD Referring: Wenda Low, MD Visit Date: 03/28/2016 Occupation: '@GUAROCC' @    Subjective:  Pain of the Right Knee and Pain of the Lower Back   History of Present Illness: [name] is a 75 y.o. female  Last seen 11/30/2015 On that visit she was prescribed Cymbalta 30 mg daily at bedtime thirty-day supply with 2 refills Patient states that she gets dizzy with the medication and her PCP stopped her from taking the medication. We will put this on patient's allergy list.  She is also allergic to Dristan and iodine injections.   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  New complaint today is that she is hurting all over. Especially painful or bilateral SI joint and bilateral greater trochanter bursa. I'm she thinks that this is coming from her back. She has a history of lumbar spine fusion back in 2012 and considers that to be the problem and that it has flared. I explained to the patient that based on her current complaint. His is more consistent with fibromyalgia. I offered her a cortisone injection and if it helps that is probably all from fibromyalgia. Patient is agreeable and has elected to move forward with one cortisone injection in the office today. If it doesn't get better then she will follow with the surgeon to evaluate for her back pain. She has no falls or any trauma to her back.    Activities of Daily Living:  Patient reports morning stiffness for 30 minutes.   Patient Reports nocturnal pain.  Difficulty dressing/grooming: Reports Difficulty climbing stairs: Reports Difficulty getting out of chair: Reports Difficulty using hands for taps, buttons, cutlery, and/or writing: Reports   Review of Systems  Constitutional: Negative for fatigue.  HENT: Negative for  mouth sores and mouth dryness.   Eyes: Negative for dryness.  Respiratory: Negative for shortness of breath.   Gastrointestinal: Negative for constipation and diarrhea.  Musculoskeletal: Negative for myalgias and myalgias.  Skin: Negative for sensitivity to sunlight.  Psychiatric/Behavioral: Negative for decreased concentration and sleep disturbance.    PMFS History:  Patient Active Problem List   Diagnosis Date Noted  . Bilateral primary osteoarthritis of knee 03/26/2016  . Osteoarthritis, hand 03/26/2016  . Other insomnia 03/26/2016  . Memory disorder 02/16/2016  . Fibromyalgia 09/27/2014  . Nocturnal leg cramps 09/27/2014  . Neuropathy ([place]) 03/10/2014  . Allergic rhinitis 03/10/2014  . Osteoporosis 03/10/2014  . Spinal stenosis of lumbar region with radiculopathy 02/28/2014  . Type II or unspecified type diabetes mellitus without mention of complication, uncontrolled 07/08/2013  . Hypokalemia 11/02/2012  . Essential hypertension, benign 11/02/2012  . Diabetes mellitus with neuropathy ([place]) 10/29/2012  . Hyperlipidemia 10/29/2012  . Spondylolisthesis of lumbar region 03/19/2011  . Lumbar radicular pain 03/19/2011    Past Medical History:  Diagnosis Date  . Anemia    has sickle cell trait  . Arthritis   . Asthma    has used inhaler in past for asthmatic bronchitis, last time- early 2012  . Complication of anesthesia    wakes up shaking  . Diabetes mellitus   . Dyslipidemia   . Fibromyalgia   . GERD (gastroesophageal reflux disease)  occas. use of  Prilosec  . Heart murmur    sees Dr. Montez Morita, last seen- early 2012  . Hypertension    02/2010- stress test /w PCP  . Neuromuscular disorder (HCC)    lumbar radiculopathy, lumbago  . Nocturnal leg cramps 09/27/2014  . Sickle cell trait ([place])   . Sleep apnea    borderline sleep apnea, states she no longer uses, early 2012- stopped using     Family History  Problem Relation Age of Onset  . Ovarian cancer Mother   .  Cancer - Prostate Father   . Breast cancer Paternal Aunt   . Multiple myeloma Paternal Aunt   . Anesthesia problems Neg Hx   . Hypotension Neg Hx   . Malignant hyperthermia Neg Hx   . Pseudochol deficiency Neg Hx    Past Surgical History:  Procedure Laterality Date  . ABDOMINAL HYSTERECTOMY    . adb.cyst     ovarian cyst  . BACK SURGERY     2012, 2015 (3 total)  . EYE SURGERY     macular degeneration treatment - injections  . OVARIAN CYST SURGERY     Social History   Social History Narrative   Patient drinks caffeine occasionally.   Patient is right handed.     Objective: Vital Signs: BP 132/82   Pulse 74   Resp 16   Ht '5\' 5"'  (1.651 m)   Wt 180 lb (81.6 kg)   BMI 29.95 kg/m    Physical Exam  Constitutional: She is oriented to person, place, and time. She appears well-developed and well-nourished.  HENT:  Head: Normocephalic and atraumatic.  Eyes: EOM are normal. Pupils are equal, round, and reactive to light.  Cardiovascular: Normal rate, regular rhythm and normal heart sounds.  Exam reveals no gallop and no friction rub.   No murmur heard. Pulmonary/Chest: Effort normal and breath sounds normal. She has no wheezes. She has no rales.  Abdominal: Soft. Bowel sounds are normal. She exhibits no distension. There is no tenderness. There is no guarding. No hernia.  Musculoskeletal: Normal range of motion. She exhibits no edema, tenderness or deformity.  Lymphadenopathy:    She has no cervical adenopathy.  Neurological: She is alert and oriented to person, place, and time. Coordination normal.  Skin: Skin is warm and dry. Capillary refill takes less than 2 seconds. No rash noted.  Psychiatric: She has a normal mood and affect. Her behavior is normal.     Musculoskeletal Exam:  Full range of motion of all joints but she walks cautiously secondary to pain. Grip strength is equal and strong bilaterally Fiber myalgia tender points are all present: 18 out of 18 Bilateral  SI joint are very painful with left worse in right  CDAI Exam: CDAI Homunculus Exam:   Joint Counts:  CDAI Tender Joint count: 0 CDAI Swollen Joint count: 0  Global Assessments:  Patient Global Assessment: 10 Provider Global Assessment: 10  CDAI Calculated Score: 20    Investigation: No additional findings.  No visits with results within 1 Month(s) from this visit.  Latest known visit with results is:  Lab on 02/22/2016  Component Date Value Ref Range Status  . Hgb A1c MFr Bld 02/22/2016 6.9* 4.6 - 6.5 % Final  . Sodium 02/22/2016 142  135 - 145 mEq/L Final  . Potassium 02/22/2016 3.8  3.5 - 5.1 mEq/L Final  . Chloride 02/22/2016 105  96 - 112 mEq/L Final  . CO2 02/22/2016 29  19 - 32  mEq/L Final  . Glucose, Bld 02/22/2016 140* 70 - 99 mg/dL Final  . BUN 02/22/2016 16  6 - 23 mg/dL Final  . Creatinine, Ser 02/22/2016 0.92  0.40 - 1.20 mg/dL Final  . Calcium 02/22/2016 9.9  8.4 - 10.5 mg/dL Final  . GFR 02/22/2016 76.29  >60.00 mL/min Final  . TSH 02/22/2016 3.62  0.35 - 4.50 uIU/mL Final  . Free T4 02/22/2016 0.78  0.60 - 1.60 ng/dL Final   Comment: Specimens from patients who are undergoing biotin therapy and /or ingesting biotin supplements may contain high levels of biotin.  The higher biotin concentration in these specimens interferes with this Free T4 assay.  Specimens that contain high levels  of biotin may cause false high results for this Free T4 assay.  Please interpret results in light of the total clinical presentation of the patient.    Jacquelyne Balint, Ur 02/22/2016 1.9  0.0 - 1.9 mg/dL Final  . Creatinine,U 02/22/2016 71.6  mg/dL Final  . Microalb Creat Ratio 02/22/2016 2.7  0.0 - 30.0 mg/g Final    Imaging: No results found.  Speciality Comments: No specialty comments available.    Procedures:  Large Joint Inj Date/Time: 03/28/2016 10:33 AM Performed by: Eliezer Lofts Authorized by: Eliezer Lofts   Consent Given by:  Patient Site marked: the  procedure site was marked   Timeout: prior to procedure the correct patient, procedure, and site was verified   Indications:  Pain Location:  Hip Site:  L hip joint Prep: patient was prepped and draped in usual sterile fashion   Needle Size:  27 G Needle Length:  1.5 inches Approach:  Superior Ultrasound Guidance: No   Fluoroscopic Guidance: No   Arthrogram: No   Medications:  1 mL lidocaine 1 %; 40 mg triamcinolone acetonide 40 MG/ML Aspiration Attempted: Yes   Aspirate amount (mL):  0 Patient tolerance:  Patient tolerated the procedure well with no immediate complications  PATIENT TOLERATED PROCEDURE WELL. THERE WERE NO COMPLICATIONS.  Patient was 75 - 100% better after the left SI joint was injected Since she is a diabetic she will coordinate her increased sugar levels that will result as a result of the cortisone with her endocrinologist.  She feels so much better that she wants to return back in a week or 2 to get the right SI joint injected. She has Voltaren gel and I have asked her to use the Voltaren gel to the other sites and see if the pain can be improved with the Voltaren gel. If not we will try to work her in for cortisone injection. I advised her to use her blood pressure needs to be normal before we can give her the injection when she returns.     Allergies: Betadine [povidone iodine]; Cymbalta [duloxetine hcl]; Adhesive [tape]; and Lipitor [atorvastatin]   Assessment / Plan:     Visit Diagnoses: Fibromyalgia  Bilateral primary osteoarthritis of knee  Primary osteoarthritis of both hands  Other insomnia  Lumbar radicular pain   Patient is currently having a flare of her fibromyalgia and she has 18 out of 18 tender points. We gave her left SI joint injection today and she did really well after the injection. She was pain-free. She is a diabetic and she could not get any additional injections today but she wants to be worked in the future if her pain does not  resolve with the use of Voltaren gel. I'm also given her baclofen to use. At one time she  was given 20 mg by another doctor but we told her we can only give her 10 mg of baclofen to use up to 3 times a day. Patient understands and is real. Note she also is aware that he can make her drowsy and she needs to avoid the medication in case it makes her drowsy. She can only use it at nighttime under those sinuses.  Patient was dizzy with the use of Cymbalta that was prescribed I Dr. Estanislado Pandy back on 11/30/2015 visit. As a result she will avoid Cymbalta and has discontinued it totally.  Return to clinic in 5 months    Orders: Orders Placed This Encounter  Procedures  . Large Joint Injection/Arthrocentesis   Meds ordered this encounter  Medications  . baclofen (LIORESAL) 10 MG tablet    Sig: 1 pill upto TID if needed for muscle spasm.    Dispense:  90 tablet    Refill:  1    Order Specific Question:   Supervising Provider    Answer:   Bo Merino [[id number]]  . diclofenac sodium (VOLTAREN) 1 % GEL    Sig: Apply 4 g topically 4 (four) times daily. Voltaren Gel 3 grams to 3 large joints upto TID 3 TUBES with 3 refills    Dispense:  3 Tube    Refill:  3    Voltaren Gel 3 grams to 3 large joints upto TID 3 TUBES with 3 refills    Order Specific Question:   Supervising Provider    Answer:   Bo Merino [phone]    Face-to-face time spent with patient was 30 minutes. 50% of time was spent in counseling and coordination of care.  Follow-Up Instructions: Return in about 5 months (around 08/26/2016) for Fibromyalga, Bil SI Jt Pain & Greater Troch Bursitis.   Eliezer Lofts, PA-C   I examined and evaluated the patient with Eliezer Lofts PA. The plan of care was discussed as noted above.  Bo Merino, MD

## 2016-03-28 ENCOUNTER — Encounter: Payer: Self-pay | Admitting: Rheumatology

## 2016-03-28 ENCOUNTER — Ambulatory Visit (INDEPENDENT_AMBULATORY_CARE_PROVIDER_SITE_OTHER): Payer: Medicare Other | Admitting: Rheumatology

## 2016-03-28 VITALS — BP 132/82 | HR 74 | Resp 16 | Ht 65.0 in | Wt 180.0 lb

## 2016-03-28 DIAGNOSIS — G4709 Other insomnia: Secondary | ICD-10-CM | POA: Diagnosis not present

## 2016-03-28 DIAGNOSIS — M461 Sacroiliitis, not elsewhere classified: Secondary | ICD-10-CM

## 2016-03-28 DIAGNOSIS — M19041 Primary osteoarthritis, right hand: Secondary | ICD-10-CM

## 2016-03-28 DIAGNOSIS — M170 Bilateral primary osteoarthritis of knee: Secondary | ICD-10-CM | POA: Diagnosis not present

## 2016-03-28 DIAGNOSIS — M5416 Radiculopathy, lumbar region: Secondary | ICD-10-CM

## 2016-03-28 DIAGNOSIS — M19042 Primary osteoarthritis, left hand: Secondary | ICD-10-CM | POA: Diagnosis not present

## 2016-03-28 DIAGNOSIS — M797 Fibromyalgia: Secondary | ICD-10-CM | POA: Diagnosis not present

## 2016-03-28 MED ORDER — LIDOCAINE HCL 1 % IJ SOLN
1.0000 mL | INTRAMUSCULAR | Status: AC | PRN
  Administered 2016-03-28: 1 mL

## 2016-03-28 MED ORDER — TRIAMCINOLONE ACETONIDE 40 MG/ML IJ SUSP
40.0000 mg | INTRAMUSCULAR | Status: AC | PRN
  Administered 2016-03-28: 40 mg via INTRA_ARTICULAR

## 2016-03-28 MED ORDER — BACLOFEN 10 MG PO TABS
ORAL_TABLET | ORAL | 1 refills | Status: DC
Start: 2016-03-28 — End: 2016-07-03

## 2016-03-28 MED ORDER — DICLOFENAC SODIUM 1 % TD GEL: 4.0000 g | Freq: Four times a day (QID) | TRANSDERMAL | 3 refills | Status: AC

## 2016-04-17 ENCOUNTER — Other Ambulatory Visit: Payer: Self-pay | Admitting: Endocrinology

## 2016-04-22 HISTORY — PX: BACK SURGERY: SHX140

## 2016-05-04 ENCOUNTER — Other Ambulatory Visit: Payer: Self-pay | Admitting: Endocrinology

## 2016-05-18 ENCOUNTER — Emergency Department (HOSPITAL_COMMUNITY): Payer: Medicare Other

## 2016-05-18 ENCOUNTER — Emergency Department (HOSPITAL_COMMUNITY)
Admission: EM | Admit: 2016-05-18 | Discharge: 2016-05-18 | Disposition: A | Discharge status: Home / Self Care | Payer: Medicare Other | Attending: Emergency Medicine | Admitting: Emergency Medicine

## 2016-05-18 ENCOUNTER — Encounter (HOSPITAL_COMMUNITY): Payer: Self-pay | Admitting: Emergency Medicine

## 2016-05-18 DIAGNOSIS — I10 Essential (primary) hypertension: Secondary | ICD-10-CM | POA: Insufficient documentation

## 2016-05-18 DIAGNOSIS — Z794 Long term (current) use of insulin: Secondary | ICD-10-CM | POA: Insufficient documentation

## 2016-05-18 DIAGNOSIS — Z87891 Personal history of nicotine dependence: Secondary | ICD-10-CM | POA: Diagnosis not present

## 2016-05-18 DIAGNOSIS — R05 Cough: Secondary | ICD-10-CM | POA: Insufficient documentation

## 2016-05-18 DIAGNOSIS — R059 Cough, unspecified: Secondary | ICD-10-CM

## 2016-05-18 DIAGNOSIS — J45909 Unspecified asthma, uncomplicated: Secondary | ICD-10-CM | POA: Diagnosis not present

## 2016-05-18 DIAGNOSIS — E1140 Type 2 diabetes mellitus with diabetic neuropathy, unspecified: Secondary | ICD-10-CM | POA: Insufficient documentation

## 2016-05-18 DIAGNOSIS — Z79899 Other long term (current) drug therapy: Secondary | ICD-10-CM | POA: Insufficient documentation

## 2016-05-18 DIAGNOSIS — Z7982 Long term (current) use of aspirin: Secondary | ICD-10-CM | POA: Insufficient documentation

## 2016-05-18 LAB — BASIC METABOLIC PANEL
Anion gap: 9 (ref 5–15)
BUN: 18 mg/dL (ref 6–20)
CHLORIDE: 104 mmol/L (ref 101–111)
CO2: 28 mmol/L (ref 22–32)
CREATININE: 0.85 mg/dL (ref 0.44–1.00)
Calcium: 9.4 mg/dL (ref 8.9–10.3)
GFR calc Af Amer: >60 mL/min (ref >60)
GFR calc non Af Amer: >60 mL/min (ref >60)
GLUCOSE: 70 mg/dL (ref 65–99)
Potassium: 3.4 mmol/L — ABNORMAL LOW (ref 3.5–5.1)
Sodium: 141 mmol/L (ref 135–145)

## 2016-05-18 LAB — CBC WITH DIFFERENTIAL/PLATELET
Basophils Absolute: 0 10*3/uL (ref 0.0–0.1)
Basophils Relative: 0 %
Eosinophils Absolute: 0.1 10*3/uL (ref 0.0–0.7)
Eosinophils Relative: 3 %
HEMATOCRIT: 37 % (ref 36.0–46.0)
HEMOGLOBIN: 12.5 g/dL (ref 12.0–15.0)
LYMPHS ABS: 1 10*3/uL (ref 0.7–4.0)
Lymphocytes Relative: 20 %
MCH: 27.8 pg (ref 26.0–34.0)
MCHC: 33.8 g/dL (ref 30.0–36.0)
MCV: 82.4 fL (ref 78.0–100.0)
MONO ABS: 0.5 10*3/uL (ref 0.1–1.0)
MONOS PCT: 10 %
NEUTROS ABS: 3.2 10*3/uL (ref 1.7–7.7)
NEUTROS PCT: 67 %
Platelets: 194 10*3/uL (ref 150–400)
RBC: 4.49 MIL/uL (ref 3.87–5.11)
RDW: 14 % (ref 11.5–15.5)
WBC: 4.8 10*3/uL (ref 4.0–10.5)

## 2016-05-18 MED ORDER — PREGABALIN 50 MG PO CAPS
50.0000 mg | ORAL_CAPSULE | Freq: Once | ORAL | Status: AC
  Administered 2016-05-18: 50 mg via ORAL
  Filled 2016-05-18: qty 1

## 2016-05-18 MED ORDER — FLUTICASONE PROPIONATE 50 MCG/ACT NA SUSP
2.0000 | Freq: Every day | NASAL | Status: DC
  Administered 2016-05-18: 2 via NASAL
  Filled 2016-05-18: qty 16

## 2016-05-18 MED ORDER — AZITHROMYCIN 250 MG PO TABS
500.0000 mg | ORAL_TABLET | Freq: Once | ORAL | Status: AC
  Administered 2016-05-18: 500 mg via ORAL
  Filled 2016-05-18: qty 2

## 2016-05-18 MED ORDER — HYDROCODONE-ACETAMINOPHEN 5-325 MG PO TABS: 1.0000 | ORAL_TABLET | Freq: Four times a day (QID) | ORAL | 0 refills | Status: DC | PRN

## 2016-05-18 MED ORDER — AZITHROMYCIN 250 MG PO TABS: ORAL_TABLET | ORAL | 0 refills | Status: DC

## 2016-05-18 NOTE — Discharge Instructions (Signed)
Drink plenty of fluids. Take Tylenol Motrin for aches. Follow-up with your family doctor this week if not improving

## 2016-05-18 NOTE — ED Triage Notes (Signed)
Pt began having non productive cough about 2 weeks ago with no fever.  Pt c/o pain when coughing in rib cage area.

## 2016-05-18 NOTE — ED Provider Notes (Signed)
[facility] DEPT Provider Note   CSN: [id number] Arrival date & time: 05/18/16  1340     History   Chief Complaint Chief Complaint  Patient presents with  . Cough  . Shortness of Breath    HPI [name] is a 76 y.o. female.  Patient complains of cough for 2 weeks and congestion weakness and aches   The history is provided by the patient.  Cough  This is a new problem. The current episode started more than 1 week ago. The problem occurs constantly. The problem has not changed since onset.The cough is non-productive. Associated symptoms include chills and shortness of breath. Pertinent negatives include no chest pain and no headaches.  Shortness of Breath  Associated symptoms include cough. Pertinent negatives include no headaches, no chest pain, no abdominal pain and no rash.    Past Medical History:  Diagnosis Date  . Anemia    has sickle cell trait  . Arthritis   . Asthma    has used inhaler in past for asthmatic bronchitis, last time- early 2012  . Complication of anesthesia    wakes up shaking  . Diabetes mellitus   . Dyslipidemia   . Fibromyalgia   . GERD (gastroesophageal reflux disease)    occas. use of  Prilosec  . Heart murmur    sees Dr. Montez Morita, last seen- early 2012  . Hypertension    02/2010- stress test /w PCP  . Neuromuscular disorder (HCC)    lumbar radiculopathy, lumbago  . Nocturnal leg cramps 09/27/2014  . Sickle cell trait ([place])   . Sleep apnea    borderline sleep apnea, states she no longer uses, early 2012- stopped using     Patient Active Problem List   Diagnosis Date Noted  . Bilateral primary osteoarthritis of knee 03/26/2016  . Osteoarthritis, hand 03/26/2016  . Other insomnia 03/26/2016  . Memory disorder 02/16/2016  . Fibromyalgia 09/27/2014  . Nocturnal leg cramps 09/27/2014  . Neuropathy ([place]) 03/10/2014  . Allergic rhinitis 03/10/2014  . Osteoporosis 03/10/2014  . Spinal stenosis of lumbar region with  radiculopathy 02/28/2014  . Type II or unspecified type diabetes mellitus without mention of complication, uncontrolled 07/08/2013  . Hypokalemia 11/02/2012  . Essential hypertension, benign 11/02/2012  . Diabetes mellitus with neuropathy ([place]) 10/29/2012  . Hyperlipidemia 10/29/2012  . Spondylolisthesis of lumbar region 03/19/2011  . Lumbar radicular pain 03/19/2011    Past Surgical History:  Procedure Laterality Date  . ABDOMINAL HYSTERECTOMY    . adb.cyst     ovarian cyst  . BACK SURGERY     2012, 2015 (3 total)  . EYE SURGERY     macular degeneration treatment - injections  . OVARIAN CYST SURGERY      OB History    No data available       Home Medications    Prior to Admission medications   Medication Sig Start Date End Date Taking? Authorizing Provider  Ascorbic Acid (VITAMIN C) 100 MG tablet Take 100 mg by mouth daily.   Yes Historical Provider, MD  aspirin 81 MG EC tablet TK 1 T PO QD 01/16/15  Yes Historical Provider, MD  baclofen (LIORESAL) 10 MG tablet 1 pill upto TID if needed for muscle spasm. 03/28/16  Yes Naitik Panwala, PA-C  calcium carbonate (TUMS EX) 750 MG chewable tablet Chew 1 tablet by mouth 2 (two) times daily as needed for heartburn (indigestion).   Yes Historical Provider, MD  cetirizine (ZYRTEC) 10 MG tablet Take 10  mg by mouth daily.   Yes Historical Provider, MD  diclofenac sodium (VOLTAREN) 1 % GEL Apply 4 g topically 4 (four) times daily. Voltaren Gel 3 grams to 3 large joints upto TID 3 TUBES with 3 refills 03/28/16 09/24/16 Yes Naitik Panwala, PA-C  gabapentin (NEURONTIN) 300 MG capsule TAKE 1 CAPSULE(300 MG) BY MOUTH THREE TIMES DAILY 05/06/16  Yes [name], MD  glimepiride (AMARYL) 2 MG tablet TAKE 2 TABLETS BY MOUTH EVERY MORNING AND THEN TAKE 2 TABLETS EVERY EVENING 05/06/16  Yes [name], MD  ibuprofen (ADVIL,MOTRIN) 800 MG tablet Take 800 mg by mouth 3 (three) times daily as needed (pain).  01/22/13  Yes Historical Provider, MD  insulin  aspart (NOVOLOG FLEXPEN) 100 UNIT/ML FlexPen Inject 6 units before breakfast, 10 units before lunch and 9 units before supper 09/04/15  Yes [name], MD  LEVEMIR FLEXTOUCH 100 UNIT/ML Pen INJECT 8 UNITS INTO THE SKIN DAILY AT 10PM Patient taking differently: INJECT 14 UNITS INTO THE SKIN DAILY AT 10PM 12/26/15  Yes [name], MD  levothyroxine (SYNTHROID, LEVOTHROID) 25 MCG tablet TAKE 1 TABLET(25 MCG) BY MOUTH DAILY BEFORE BREAKFAST 04/18/16  Yes [name], MD  LYRICA 50 MG capsule Take 50 mg by mouth as needed. Reported on 07/06/2015 05/23/14  Yes Historical Provider, MD  Potassium 99 MG TABS Take 1 tablet by mouth 2 (two) times daily.   Yes Historical Provider, MD  pyridOXINE (VITAMIN B-6) 100 MG tablet Take 100 mg by mouth daily.   Yes Historical Provider, MD  vitamin B-12 (CYANOCOBALAMIN) 100 MCG tablet Take 100 mcg by mouth daily.   Yes Historical Provider, MD  vitamin E 400 UNIT capsule Take 400 Units by mouth daily.   Yes Historical Provider, MD  azithromycin (ZITHROMAX) 250 MG tablet Take one pill a day 05/18/16   Milton Ferguson, MD  BD PEN NEEDLE NANO U/F 32G X 4 MM MISC USE AS DIRECTED 03/21/16   [name], MD  ezetimibe (ZETIA) 10 MG tablet Take 1 tablet (10 mg total) by mouth daily. 05/04/15   [name], MD  HYDROcodone-acetaminophen (NORCO/VICODIN) 5-325 MG tablet Take 1 tablet by mouth every 6 (six) hours as needed for moderate pain. 05/18/16   Milton Ferguson, MD  LEVEMIR FLEXTOUCH 100 UNIT/ML Pen INJECT 8 UNITS INTO THE SKIN DAILY AT 10PM Patient not taking: Reported on 05/18/2016 04/18/16   [name], MD    Family History Family History  Problem Relation Age of Onset  . Ovarian cancer Mother   . Cancer - Prostate Father   . Breast cancer Paternal Aunt   . Multiple myeloma Paternal Aunt   . Anesthesia problems Neg Hx   . Hypotension Neg Hx   . Malignant hyperthermia Neg Hx   . Pseudochol deficiency Neg Hx     Social History Social History  Substance Use Topics  . Smoking  status: Former Smoker    Quit date: 03/15/1999  . Smokeless tobacco: Never Used  . Alcohol use Yes     Comment: wine /w dinner on occas.     Allergies   Betadine [povidone iodine]; Cymbalta [duloxetine hcl]; Adhesive [tape]; and Lipitor [atorvastatin]   Review of Systems Review of Systems  Constitutional: Positive for chills. Negative for appetite change and fatigue.  HENT: Negative for congestion, ear discharge and sinus pressure.   Eyes: Negative for discharge.  Respiratory: Positive for cough and shortness of breath.   Cardiovascular: Negative for chest pain.  Gastrointestinal: Negative for abdominal pain and diarrhea.  Genitourinary: Negative for frequency and hematuria.  Musculoskeletal: Negative for back pain.  Skin: Negative for rash.  Neurological: Negative for seizures and headaches.  Psychiatric/Behavioral: Negative for hallucinations.     Physical Exam Updated Vital Signs BP 108/95 (BP Location: Left Arm)   Pulse 85   Temp 97.7 F (36.5 C) (Oral)   Resp 18   Ht '5\' 5"'  (1.651 m)   Wt 175 lb (79.4 kg)   SpO2 94%   BMI 29.12 kg/m   Physical Exam  Constitutional: She is oriented to person, place, and time. She appears well-developed.  HENT:  Head: Normocephalic.  Eyes: Conjunctivae and EOM are normal. No scleral icterus.  Neck: Neck supple. No thyromegaly present.  Cardiovascular: Normal rate and regular rhythm.  Exam reveals no gallop and no friction rub.   No murmur heard. Pulmonary/Chest: No stridor. She has no wheezes. She has no rales. She exhibits no tenderness.  Abdominal: She exhibits no distension. There is no tenderness. There is no rebound.  Musculoskeletal: Normal range of motion. She exhibits no edema.  Lymphadenopathy:    She has no cervical adenopathy.  Neurological: She is oriented to person, place, and time. She exhibits normal muscle tone. Coordination normal.  Skin: No rash noted. No erythema.  Psychiatric: She has a normal mood and  affect. Her behavior is normal.     ED Treatments / Results  Labs (all labs ordered are listed, but only abnormal results are displayed) Labs Reviewed  BASIC METABOLIC PANEL - Abnormal; Notable for the following:       Result Value   Potassium 3.4 (*)    All other components within normal limits  CBC WITH DIFFERENTIAL/PLATELET    EKG  EKG Interpretation  Date/Time:  Saturday May 18 2016 14:18:18 EST Ventricular Rate:  83 PR Interval:    QRS Duration: 89 QT Interval:  351 QTC Calculation: 413 R Axis:   32 Text Interpretation:  Sinus rhythm Confirmed by Trystin Hargrove  MD, Tyrihanna Wingert [phone]) on 05/18/2016 6:20:46 PM       Radiology Dg Chest 2 View  Result Date: 05/18/2016 CLINICAL DATA:  Cough for 4-5 days EXAM: CHEST  2 VIEW COMPARISON:  01/02/2011 FINDINGS: Mild hyperinflation. Bibasilar atelectasis. Heart is normal size. No effusions or acute bony abnormality. IMPRESSION: Hyperinflation.  Bibasilar atelectasis. Electronically Signed   By: Rolm Baptise M.D.   On: 05/18/2016 14:21    Procedures Procedures (including critical care time)  Medications Ordered in ED Medications  fluticasone (FLONASE) 50 MCG/ACT nasal spray 2 spray (2 sprays Each Nare Given 05/18/16 1632)  azithromycin (ZITHROMAX) tablet 500 mg (not administered)  pregabalin (LYRICA) capsule 50 mg (50 mg Oral Given 05/18/16 1632)     Initial Impression / Assessment and Plan / ED Course  I have reviewed the triage vital signs and the nursing notes.  Pertinent labs & imaging results that were available during my care of the patient were reviewed by me and considered in my medical decision making (see chart for details).     Patient with cough for 2 weeks. No obvious pneumonia on chest x-ray. Patient will be sent home with Z-Pak and will follow-up with her PCP  Final Clinical Impressions(s) / ED Diagnoses   Final diagnoses:  Cough    New Prescriptions New Prescriptions   AZITHROMYCIN (ZITHROMAX) 250 MG  TABLET    Take one pill a day   HYDROCODONE-ACETAMINOPHEN (NORCO/VICODIN) 5-325 MG TABLET    Take 1 tablet by mouth every 6 (six) hours as  needed for moderate pain.     Milton Ferguson, MD 05/18/16 [phone]

## 2016-05-20 DIAGNOSIS — M791 Myalgia: Secondary | ICD-10-CM | POA: Diagnosis not present

## 2016-05-20 DIAGNOSIS — R6883 Chills (without fever): Secondary | ICD-10-CM | POA: Diagnosis not present

## 2016-05-20 DIAGNOSIS — J101 Influenza due to other identified influenza virus with other respiratory manifestations: Secondary | ICD-10-CM | POA: Diagnosis not present

## 2016-05-20 DIAGNOSIS — R509 Fever, unspecified: Secondary | ICD-10-CM | POA: Diagnosis not present

## 2016-05-20 DIAGNOSIS — R05 Cough: Secondary | ICD-10-CM | POA: Diagnosis not present

## 2016-05-24 ENCOUNTER — Other Ambulatory Visit: Payer: Medicare Other

## 2016-05-24 ENCOUNTER — Other Ambulatory Visit (INDEPENDENT_AMBULATORY_CARE_PROVIDER_SITE_OTHER): Payer: Medicare Other

## 2016-05-24 DIAGNOSIS — E1165 Type 2 diabetes mellitus with hyperglycemia: Secondary | ICD-10-CM

## 2016-05-24 DIAGNOSIS — Z794 Long term (current) use of insulin: Secondary | ICD-10-CM

## 2016-05-24 LAB — LIPID PANEL
CHOLESTEROL: 227 mg/dL — AB (ref 0–200)
HDL: 44.7 mg/dL (ref >39.00)
LDL CALC: 155 mg/dL — AB (ref 0–99)
NonHDL: 182.62
Total CHOL/HDL Ratio: 5
Triglycerides: 137 mg/dL (ref 0.0–149.0)
VLDL: 27.4 mg/dL (ref 0.0–40.0)

## 2016-05-24 LAB — COMPREHENSIVE METABOLIC PANEL
ALBUMIN: 4.3 g/dL (ref 3.5–5.2)
ALT: 13 U/L (ref 0–35)
AST: 19 U/L (ref 0–37)
Alkaline Phosphatase: 47 U/L (ref 39–117)
BUN: 16 mg/dL (ref 6–23)
CHLORIDE: 102 meq/L (ref 96–112)
CO2: 33 mEq/L — ABNORMAL HIGH (ref 19–32)
Calcium: 9.4 mg/dL (ref 8.4–10.5)
Creatinine, Ser: 1.03 mg/dL (ref 0.40–1.20)
GFR: 66.92 mL/min (ref >60.00)
Glucose, Bld: 102 mg/dL — ABNORMAL HIGH (ref 70–99)
POTASSIUM: 3.5 meq/L (ref 3.5–5.1)
SODIUM: 142 meq/L (ref 135–145)
Total Bilirubin: 0.5 mg/dL (ref 0.2–1.2)
Total Protein: 7.1 g/dL (ref 6.0–8.3)

## 2016-05-24 LAB — HEMOGLOBIN A1C: HEMOGLOBIN A1C: 8.1 % — AB (ref 4.6–6.5)

## 2016-05-27 ENCOUNTER — Ambulatory Visit: Payer: Medicare Other | Admitting: Endocrinology

## 2016-05-29 DIAGNOSIS — R05 Cough: Secondary | ICD-10-CM | POA: Diagnosis not present

## 2016-05-29 DIAGNOSIS — J45909 Unspecified asthma, uncomplicated: Secondary | ICD-10-CM | POA: Diagnosis not present

## 2016-06-04 ENCOUNTER — Encounter: Payer: Self-pay | Admitting: Endocrinology

## 2016-06-04 ENCOUNTER — Ambulatory Visit (INDEPENDENT_AMBULATORY_CARE_PROVIDER_SITE_OTHER): Payer: Medicare Other | Admitting: Endocrinology

## 2016-06-04 VITALS — BP 128/68 | HR 85 | Ht 65.0 in | Wt 178.0 lb

## 2016-06-04 DIAGNOSIS — E1165 Type 2 diabetes mellitus with hyperglycemia: Secondary | ICD-10-CM

## 2016-06-04 DIAGNOSIS — E782 Mixed hyperlipidemia: Secondary | ICD-10-CM

## 2016-06-04 DIAGNOSIS — Z794 Long term (current) use of insulin: Secondary | ICD-10-CM

## 2016-06-04 DIAGNOSIS — E038 Other specified hypothyroidism: Secondary | ICD-10-CM | POA: Diagnosis not present

## 2016-06-04 DIAGNOSIS — E063 Autoimmune thyroiditis: Secondary | ICD-10-CM

## 2016-06-04 DIAGNOSIS — I10 Essential (primary) hypertension: Secondary | ICD-10-CM | POA: Diagnosis not present

## 2016-06-04 NOTE — Progress Notes (Signed)
Patient ID: [name], female   DOB: 17-Mar-1941, 76 y.o.   MRN: [id number]    Reason for Appointment:    follow-up  History of Present Illness    PROBLEM 1: Type 2 diabetes mellitus, date of diagnosis: 1998.   Prior history: She had previously been treated with Byetta, Glumetza, Amaryl, Victoza and  Onglyza However because of inadequate control and intolerance to drugs she was finally given pre-meal insulin along with Amaryl, Glumetza eventually stopped because of side effects and also Lantus added  Recent history:  The insulin regimen is: Novolog 6-8 at breakfast and lunch,  10 units acs   LEVEMIR insulin 14 units daily hs Oral agents: None  Her A1c has been mostly between about 6.8 -7.4,  now higher at 8.1  Current blood sugar patterns and problems with management:  She has checked her blood sugars somewhat infrequently and mostly in the morning hours    She thinks some of her high readings have been related to respiratory illness  Also she thinks that she has had more stress and continued pain with her back pain and sciatica.  May not be compliant with diet on the time and getting more snacks especially late in the evening  Highest blood sugar was 333 late at night  Although she thinks she is taking extra Novolog when her blood sugars are higher last Friday when she got a steroid injection  She is mostly checking blood sugars before her first meal    Had previously tried Victoza which caused nausea  Monitors blood glucose: 1-2 times a day, readings as below Glucometer: One Touch.   Mean values apply above for all meters except median for One Touch  PRE-MEAL Fasting Lunch Dinner Bedtime Overall  Glucose range: 91-316 110-318  137-333   Mean/median: 149    143    Meals: 2-3 meals per day at 10 am. 2 pm and 6-8 pm but inconsistent schedule.  Will usually try to get protein at breakfast.    Physical activity: exercise:  Water aerobics currently 2  times a week  Certified Diabetes Educator visit: Most recent: 7/13.  Dietician visit: Most recent: 3/13.   Wt Readings from Last 3 Encounters:  06/04/16 178 lb (80.7 kg)  05/18/16 175 lb (79.4 kg)  03/28/16 180 lb (01.7 kg)   Complications: Neuropathy  LABS:  Lab Results  Component Value Date   HGBA1C 8.1 (H) 05/24/2016   HGBA1C 6.9 (H) 02/22/2016   HGBA1C 7.2 (H) 09/19/2015   Lab Results  Component Value Date   MICROALBUR 1.9 02/22/2016   LDLCALC 155 (H) 05/24/2016   CREATININE 1.03 05/24/2016    Multiple other issues are addressed in review of systems  No visits with results within 1 Week(s) from this visit.  Latest known visit with results is:  Lab on 05/24/2016  Component Date Value Ref Range Status  . Hgb A1c MFr Bld 05/24/2016 8.1* 4.6 - 6.5 % Final  . Sodium 05/24/2016 142  135 - 145 mEq/L Final  . Potassium 05/24/2016 3.5  3.5 - 5.1 mEq/L Final  . Chloride 05/24/2016 102  96 - 112 mEq/L Final  . CO2 05/24/2016 33* 19 - 32 mEq/L Final  . Glucose, Bld 05/24/2016 102* 70 - 99 mg/dL Final  . BUN 05/24/2016 16  6 - 23 mg/dL Final  . Creatinine, Ser 05/24/2016 1.03  0.40 - 1.20 mg/dL Final  . Total Bilirubin 05/24/2016 0.5  0.2 - 1.2 mg/dL Final  .  Alkaline Phosphatase 05/24/2016 47  39 - 117 U/L Final  . AST 05/24/2016 19  0 - 37 U/L Final  . ALT 05/24/2016 13  0 - 35 U/L Final  . Total Protein 05/24/2016 7.1  6.0 - 8.3 g/dL Final  . Albumin 05/24/2016 4.3  3.5 - 5.2 g/dL Final  . Calcium 05/24/2016 9.4  8.4 - 10.5 mg/dL Final  . GFR 05/24/2016 66.92  >60.00 mL/min Final  . Cholesterol 05/24/2016 227* 0 - 200 mg/dL Final  . Triglycerides 05/24/2016 137.0  0.0 - 149.0 mg/dL Final  . HDL 05/24/2016 44.70  >39.00 mg/dL Final  . VLDL 05/24/2016 27.4  0.0 - 40.0 mg/dL Final  . LDL Cholesterol 05/24/2016 155* 0 - 99 mg/dL Final  . Total CHOL/HDL Ratio 05/24/2016 5   Final  . NonHDL 05/24/2016 182.62   Final      Allergies as of 06/04/2016      Reactions    Betadine [povidone Iodine] Swelling   Reaction to betadine eye drops   Cymbalta [duloxetine Hcl] Other (See Comments)   Dizziness    Adhesive [tape] Rash   Lipitor [atorvastatin] Rash      Medication List       Accurate as of 06/04/16 11:59 PM. Always use your most recent med list.          aspirin 81 MG EC tablet TK 1 T PO QD   azithromycin 250 MG tablet Commonly known as:  ZITHROMAX Take one pill a day   baclofen 10 MG tablet Commonly known as:  LIORESAL 1 pill upto TID if needed for muscle spasm.   BD PEN NEEDLE NANO U/F 32G X 4 MM Misc Generic drug:  Insulin Pen Needle USE AS DIRECTED   calcium carbonate 750 MG chewable tablet Commonly known as:  TUMS EX Chew 1 tablet by mouth 2 (two) times daily as needed for heartburn (indigestion).   cetirizine 10 MG tablet Commonly known as:  ZYRTEC Take 10 mg by mouth daily.   diclofenac sodium 1 % Gel Commonly known as:  VOLTAREN Apply 4 g topically 4 (four) times daily. Voltaren Gel 3 grams to 3 large joints upto TID 3 TUBES with 3 refills   ezetimibe 10 MG tablet Commonly known as:  ZETIA Take 1 tablet (10 mg total) by mouth daily.   gabapentin 300 MG capsule Commonly known as:  NEURONTIN TAKE 1 CAPSULE(300 MG) BY MOUTH THREE TIMES DAILY   glimepiride 2 MG tablet Commonly known as:  AMARYL TAKE 2 TABLETS BY MOUTH EVERY MORNING AND THEN TAKE 2 TABLETS EVERY EVENING   HYDROcodone-acetaminophen 5-325 MG tablet Commonly known as:  NORCO/VICODIN Take 1 tablet by mouth every 6 (six) hours as needed for moderate pain.   ibuprofen 800 MG tablet Commonly known as:  ADVIL,MOTRIN Take 800 mg by mouth 3 (three) times daily as needed (pain).   insulin aspart 100 UNIT/ML FlexPen Commonly known as:  NOVOLOG FLEXPEN Inject 6 units before breakfast, 10 units before lunch and 9 units before supper   LEVEMIR FLEXTOUCH 100 UNIT/ML Pen Generic drug:  Insulin Detemir INJECT 8 UNITS INTO THE SKIN DAILY AT 10PM   LEVEMIR  FLEXTOUCH 100 UNIT/ML Pen Generic drug:  Insulin Detemir INJECT 8 UNITS INTO THE SKIN DAILY AT 10PM   levothyroxine 25 MCG tablet Commonly known as:  SYNTHROID, LEVOTHROID TAKE 1 TABLET(25 MCG) BY MOUTH DAILY BEFORE BREAKFAST   LYRICA 50 MG capsule Generic drug:  pregabalin Take 50 mg by mouth as needed. Reported  on 07/06/2015   Potassium 99 MG Tabs Take 1 tablet by mouth 2 (two) times daily.   predniSONE 20 MG tablet Commonly known as:  DELTASONE   PROAIR RESPICLICK 952 (90 Base) MCG/ACT Aepb Generic drug:  Albuterol Sulfate   pyridOXINE 100 MG tablet Commonly known as:  VITAMIN B-6 Take 100 mg by mouth daily.   SYMBICORT 80-4.5 MCG/ACT inhaler Generic drug:  budesonide-formoterol   vitamin B-12 100 MCG tablet Commonly known as:  CYANOCOBALAMIN Take 100 mcg by mouth daily.   vitamin C 100 MG tablet Take 100 mg by mouth daily.   vitamin E 400 UNIT capsule Take 400 Units by mouth daily.       Allergies:  Allergies  Allergen Reactions  . Betadine [Povidone Iodine] Swelling    Reaction to betadine eye drops  . Cymbalta [Duloxetine Hcl] Other (See Comments)    Dizziness   . Adhesive [Tape] Rash  . Lipitor [Atorvastatin] Rash    Past Medical History:  Diagnosis Date  . Anemia    has sickle cell trait  . Arthritis   . Asthma    has used inhaler in past for asthmatic bronchitis, last time- early 2012  . Complication of anesthesia    wakes up shaking  . Diabetes mellitus   . Dyslipidemia   . Fibromyalgia   . GERD (gastroesophageal reflux disease)    occas. use of  Prilosec  . Heart murmur    sees Dr. Montez Morita, last seen- early 2012  . Hypertension    02/2010- stress test /w PCP  . Neuromuscular disorder (HCC)    lumbar radiculopathy, lumbago  . Nocturnal leg cramps 09/27/2014  . Sickle cell trait ([place])   . Sleep apnea    borderline sleep apnea, states she no longer uses, early 2012- stopped using     Past Surgical History:  Procedure Laterality Date    . ABDOMINAL HYSTERECTOMY    . adb.cyst     ovarian cyst  . BACK SURGERY     2012, 2015 (3 total)  . EYE SURGERY     macular degeneration treatment - injections  . OVARIAN CYST SURGERY      Family History  Problem Relation Age of Onset  . Ovarian cancer Mother   . Cancer - Prostate Father   . Breast cancer Paternal Aunt   . Multiple myeloma Paternal Aunt   . Anesthesia problems Neg Hx   . Hypotension Neg Hx   . Malignant hyperthermia Neg Hx   . Pseudochol deficiency Neg Hx     Social History:  reports that she quit smoking about 17 years ago. She has never used smokeless tobacco. She reports that she drinks alcohol. She reports that she does not use drugs.  ROS    NEUROPATHY: She has had tingling infeet and legs and also has pain going down her legs especially at night. She is taking mostly gabapentin with relief She thinks she takes Lyrica only as needed for fibromyalgia  Diabetic foot exam done in 9/16 Diabetic foot exam shows normal monofilament sensation in the toes and plantar surfaces, no skin lesions or ulcers on the feet and normal pedal pulses  Hyperlipidemia:   The lipid abnormality consists of elevated LDL , high triglyceride and did not tolerate Crestor or lovastatin She thinks they make her cramps worse  LDL is increased as expected  Zetia is too expensive, however she is not recently taking this      Lab Results  Component Value Date  CHOL 227 (H) 05/24/2016   HDL 44.70 05/24/2016   LDLCALC 155 (H) 05/24/2016   LDLDIRECT 153.2 10/27/2012   TRIG 137.0 05/24/2016   CHOLHDL 5 05/24/2016     MUSCLE cramps: she is taking baclofen without adequate relief  HYPERTENSION:  Has been present for several years.   His controlled with taking Diovan HCT Does monitor at home   Lab Results  Component Value Date   CREATININE 1.03 05/24/2016   BUN 16 05/24/2016   NA 142 05/24/2016   K 3.5 05/24/2016   CL 102 05/24/2016   CO2 33 (H) 05/24/2016      Nonspecific fatigue: Does have a relatively high TSH as of 4/17 and was empirically given Synthroid 25 g, Half tablet daily  TSH is improved   Lab Results  Component Value Date   TSH 3.62 02/22/2016   TSH 3.75 09/19/2015   TSH 7.13 (H) 07/27/2015   FREET4 0.78 02/22/2016         Examination:   BP 128/68   Pulse 85   Ht _0  (1.651 m)   Wt 178 lb (80.7 kg)   SpO2 96%   BMI 29.62 kg/m   Body mass index is 29.62 kg/m.      Assesment/Plan:   1.DIABETES type 2 with BMI of 28 See history of present illness for detailed discussion of management, blood sugar patterns and problems identified  Her blood sugars are erratic and her A1c has gone of significant lead to 8%. She is not adjusting her insulin doses based on her blood sugar patterns especially when blood sugars are higher with intercurrent illness and also  when she has had steroids or infections causing high blood sugars. Recent blood sugars have been as high as 316 fasting and she does not understand the need to adjust her Lantus insulin when fasting readings are consistently high. She is also quite depressed and not able to keep up with the diabetes management, also does not understand the need to check readings more often when she is having intercurrent illnesses.  For now she can start increasing her Levemir until the effects of her steroids are gone She will also need to start checking blood sugars more often later in the day especially after meals Although she is a good candidate for the freestyle Libre system this probably is not covered because of her lack of adequate glucose monitoring Discussed A1c results also.  Patient Instructions  Take Levemir 24 units until next Sunday then reduce 4 units every 2 days until down to 14  Check sugar before all meals and bedtime:  Add Novolog for high sugars when on prednisone as follows: sugar 150-200 take 2 more  200--250 take 4 more   250 or more take 6  more   3. Hypertension: Blood pressure is controlled on Diovan HCT   4.   Hyperlipidemia: She Needs to resume her Zetia, discussed her significantly high LDL level    Patient Instructions  Take Levemir 24 units until next Sunday then reduce 4 units every 2 days until down to 14  Check sugar before all meals and bedtime:  Add Novolog for high sugars when on prednisone as follows: sugar 150-200 take 2 more  200--250 take 4 more   250 or more take 6 more    Counseling time on subjects discussed above is over 50% of today's 25 minute visit   Emory Leaver 06/05/2016, 8:50 PM       No visits with results within  1 Week(s) from this visit.  Latest known visit with results is:  Lab on 05/24/2016  Component Date Value Ref Range Status  . Hgb A1c MFr Bld 05/24/2016 8.1* 4.6 - 6.5 % Final  . Sodium 05/24/2016 142  135 - 145 mEq/L Final  . Potassium 05/24/2016 3.5  3.5 - 5.1 mEq/L Final  . Chloride 05/24/2016 102  96 - 112 mEq/L Final  . CO2 05/24/2016 33* 19 - 32 mEq/L Final  . Glucose, Bld 05/24/2016 102* 70 - 99 mg/dL Final  . BUN 05/24/2016 16  6 - 23 mg/dL Final  . Creatinine, Ser 05/24/2016 1.03  0.40 - 1.20 mg/dL Final  . Total Bilirubin 05/24/2016 0.5  0.2 - 1.2 mg/dL Final  . Alkaline Phosphatase 05/24/2016 47  39 - 117 U/L Final  . AST 05/24/2016 19  0 - 37 U/L Final  . ALT 05/24/2016 13  0 - 35 U/L Final  . Total Protein 05/24/2016 7.1  6.0 - 8.3 g/dL Final  . Albumin 05/24/2016 4.3  3.5 - 5.2 g/dL Final  . Calcium 05/24/2016 9.4  8.4 - 10.5 mg/dL Final  . GFR 05/24/2016 66.92  >60.00 mL/min Final  . Cholesterol 05/24/2016 227* 0 - 200 mg/dL Final  . Triglycerides 05/24/2016 137.0  0.0 - 149.0 mg/dL Final  . HDL 05/24/2016 44.70  >39.00 mg/dL Final  . VLDL 05/24/2016 27.4  0.0 - 40.0 mg/dL Final  . LDL Cholesterol 05/24/2016 155* 0 - 99 mg/dL Final  . Total CHOL/HDL Ratio 05/24/2016 5   Final  . NonHDL 05/24/2016 182.62   Final

## 2016-06-04 NOTE — Patient Instructions (Addendum)
Take Levemir 24 units until next Sunday then reduce 4 units every 2 days until down to 14  Check sugar before all meals and bedtime:  Add Novolog for high sugars when on prednisone as follows: sugar 150-200 take 2 more  200--250 take 4 more   250 or more take 6 more

## 2016-06-08 ENCOUNTER — Other Ambulatory Visit: Payer: Self-pay | Admitting: Endocrinology

## 2016-06-09 ENCOUNTER — Other Ambulatory Visit: Payer: Self-pay | Admitting: Endocrinology

## 2016-06-10 ENCOUNTER — Ambulatory Visit (INDEPENDENT_AMBULATORY_CARE_PROVIDER_SITE_OTHER): Payer: Medicare Other | Admitting: Rheumatology

## 2016-06-10 ENCOUNTER — Telehealth: Payer: Self-pay | Admitting: Endocrinology

## 2016-06-10 ENCOUNTER — Telehealth: Payer: Self-pay | Admitting: Rheumatology

## 2016-06-10 ENCOUNTER — Encounter: Payer: Self-pay | Admitting: Rheumatology

## 2016-06-10 VITALS — BP 122/74 | HR 79 | Resp 13

## 2016-06-10 DIAGNOSIS — M461 Sacroiliitis, not elsewhere classified: Secondary | ICD-10-CM

## 2016-06-10 MED ORDER — TRIAMCINOLONE ACETONIDE 40 MG/ML IJ SUSP
40.0000 mg | INTRAMUSCULAR | Status: AC | PRN
  Administered 2016-06-10: 40 mg via INTRA_ARTICULAR

## 2016-06-10 MED ORDER — LIDOCAINE HCL 1 % IJ SOLN
1.0000 mL | INTRAMUSCULAR | Status: AC | PRN
  Administered 2016-06-10: 1 mL

## 2016-06-10 NOTE — Telephone Encounter (Signed)
Patient advised she can come for an appointment for an injection at  11:15 today. Patient accepted appointment.

## 2016-06-10 NOTE — Telephone Encounter (Signed)
OK TO SCHEDULE APPT FOR 11:15AM (WILL BE SEEN AS LAST PATIENT IN MORNING.  SO SHE WILL HAVE TO WAIT UNTIL MORNING PATIENTS ARE ALL SEEN FIRST. NOTIFY SHARON

## 2016-06-10 NOTE — Telephone Encounter (Signed)
Patient called this morning wanting to be fit in to Mr. Gardiner Ramus schedule.  She is having a Fibro flare up and is in a lot of pain.  Cb#[phone].  Thank you

## 2016-06-10 NOTE — Telephone Encounter (Signed)
Pt got a steriod shot today so what adjustment should she do with her insulin for now.  Pt also needs refills on novolog and levemir called to [facility]

## 2016-06-10 NOTE — Telephone Encounter (Signed)
Patient states she pain in right hip. Patient states the pain has been going on for approximately 2 weeks. Patient states she was having trouble walking yesterday and still having trouble walking today. Patient is  Having to use her walker to get around. Patient is requesting an appointment for an injection.

## 2016-06-10 NOTE — Progress Notes (Signed)
   Procedure Note  Patient: [name]             Date of Birth: 11-29-1940           MRN: [id number]             Visit Date: 06/10/2016  Procedures: Visit Diagnoses: No diagnosis found.  Patient's blood pressure is 122/74 today. She is a diabetic and she will monitor her sugar levels. We've given her right SI joint injection back in December 2017. She was invited to return back to weeks later so we can give the right SI joint injection but she did rather well and did not need it until today. Between the last visit and this visit, she does report that she had the flu and had to be rushed to the ER. She is doing well now. She recovered from the flu just fine. She did not fall or injure herself. She is just having a flare of her sacroiliitis at this time it's on the right side rather than the left side.  She's also requesting a prescription for Lyrica. The last in we wrote prescription for Lyrica for the patient was back in 2014.  Large Joint Inj Date/Time: 06/10/2016 12:06 PM Performed by: Eliezer Lofts Authorized by: Eliezer Lofts   Consent Given by:  Patient Site marked: the procedure site was marked   Timeout: prior to procedure the correct patient, procedure, and site was verified   Indications:  Pain Location:  Sacroiliac Site:  R sacroiliac joint Prep: patient was prepped and draped in usual sterile fashion   Needle Size:  27 G Needle Length:  1.5 inches Approach:  Superior Ultrasound Guidance: No   Fluoroscopic Guidance: No   Arthrogram: No   Medications:  1 mL lidocaine 1 %; 40 mg triamcinolone acetonide 40 MG/ML Aspiration Attempted: Yes   Aspirate amount (mL):  0 Patient tolerance:  Patient tolerated the procedure well with no immediate complications    Patient was 75 - 100% better after the left SI joint was injected Since she is a diabetic she will coordinate her increased sugar levels that will result as a result of the cortisone with her  endocrinologist.  She feels so much better that she wants to return back in a week or 2 to get the right SI joint injected. She has Voltaren gel and I have asked her to use the Voltaren gel to the other sites and see if the pain can be improved with the Voltaren gel. If not we will try to work her in for cortisone injection. I advised her to use her blood pressure needs to be normal before we can give her the injection when she returns.    Patient is requesting a refill on Lyrica. She takes it minimally, rarely. She takes 50 mg at night to help her with her flares. The flares are infrequent.  I'm giving her samples of Lyrica 50 mg. One box has 21 tablets I'm giving her 2 boxes. She should not take gabapentin when she is on this medication. Patient understands and is agreeable. 1 tablet daily at bedtime when necessary pain.

## 2016-06-11 ENCOUNTER — Other Ambulatory Visit: Payer: Self-pay

## 2016-06-11 NOTE — Telephone Encounter (Signed)
Take Levemir 20 units until morning sugars are below 120  then reduce back to 14  Check sugar before all meals and bedtime:  Add ADDITIONAL Novolog for high sugars  as follows: sugar 150-200 take 2 more units  200--250 take 4 more   250 or more take 6 more

## 2016-06-13 ENCOUNTER — Other Ambulatory Visit: Payer: Self-pay | Admitting: Endocrinology

## 2016-06-13 DIAGNOSIS — M9983 Other biomechanical lesions of lumbar region: Secondary | ICD-10-CM | POA: Diagnosis not present

## 2016-06-13 DIAGNOSIS — Z6833 Body mass index (BMI) 33.0-33.9, adult: Secondary | ICD-10-CM | POA: Diagnosis not present

## 2016-06-13 DIAGNOSIS — M48062 Spinal stenosis, lumbar region with neurogenic claudication: Secondary | ICD-10-CM | POA: Diagnosis not present

## 2016-06-13 DIAGNOSIS — I10 Essential (primary) hypertension: Secondary | ICD-10-CM | POA: Diagnosis not present

## 2016-06-13 NOTE — Telephone Encounter (Signed)
Please address the note below asap thank you!

## 2016-06-13 NOTE — Telephone Encounter (Signed)
Take Levemir 20 units until morning sugars are below 120  then reduce back to 14  Check sugar before all meals and bedtime:  Add ADDITIONAL Novolog for high sugars  as follows: sugar 150-200 take 2 more units  200--250 take 4 more   250 or more take 6 more

## 2016-06-14 ENCOUNTER — Other Ambulatory Visit: Payer: Self-pay | Admitting: Neurological Surgery

## 2016-06-14 DIAGNOSIS — M48061 Spinal stenosis, lumbar region without neurogenic claudication: Secondary | ICD-10-CM

## 2016-06-14 NOTE — Telephone Encounter (Signed)
Left a voice mail with instructions and requested a call back if she had any questions 

## 2016-06-17 ENCOUNTER — Other Ambulatory Visit: Payer: Self-pay | Admitting: Endocrinology

## 2016-06-18 ENCOUNTER — Ambulatory Visit
Admission: RE | Admit: 2016-06-18 | Discharge: 2016-06-18 | Disposition: A | Discharge status: Home / Self Care | Payer: Medicare Other | Source: Ambulatory Visit | Attending: Neurological Surgery | Admitting: Neurological Surgery

## 2016-06-18 ENCOUNTER — Other Ambulatory Visit: Payer: Self-pay | Admitting: Neurological Surgery

## 2016-06-18 DIAGNOSIS — M48061 Spinal stenosis, lumbar region without neurogenic claudication: Secondary | ICD-10-CM

## 2016-06-20 DIAGNOSIS — M48062 Spinal stenosis, lumbar region with neurogenic claudication: Secondary | ICD-10-CM | POA: Diagnosis not present

## 2016-06-20 DIAGNOSIS — G9340 Encephalopathy, unspecified: Secondary | ICD-10-CM

## 2016-06-20 HISTORY — DX: Encephalopathy, unspecified: G93.40

## 2016-06-21 ENCOUNTER — Other Ambulatory Visit: Payer: Self-pay | Admitting: Neurological Surgery

## 2016-06-24 ENCOUNTER — Encounter (HOSPITAL_COMMUNITY): Payer: Self-pay | Admitting: *Deleted

## 2016-06-24 NOTE — Progress Notes (Signed)
Spoke with pt for pre-op call. Pt denies cardiac history, chest pain or sob. Pt is diabetic, type 2. Last A1C was 8.1 on 05/24/16. Pt states her fasting blood sugar has been running higher the past few weeks due to pain, being on prednisone and getting injections in her back. Most recently they have been around 160-170. Pt instructed not to take her evening and AM dose of Glimerpiride and to take 1/2 of her regular dose of Levemir tonight (will take 7 units). Also instructed not to take her Novolog in the AM. Pt instructed to check her blood sugar in the AM when she gets up and every 2 hours until she leaves for the hospital. If blood sugar is 70 or below, treat with 1/2 cup of clear juice (apple or cranberry) and recheck blood sugar 15 minutes after drinking juice. If blood sugar continues to be 70 or below, call the Short Stay department and ask to speak to a nurse. She voiced understanding.

## 2016-06-25 ENCOUNTER — Encounter (HOSPITAL_COMMUNITY)
Admission: RE | Disposition: A | Discharge status: Rehabilitation Facility | Payer: Self-pay | Source: Ambulatory Visit | Attending: Neurological Surgery

## 2016-06-25 ENCOUNTER — Ambulatory Visit: Payer: Medicare Other | Admitting: Allergy and Immunology

## 2016-06-25 ENCOUNTER — Inpatient Hospital Stay (HOSPITAL_COMMUNITY): Payer: Medicare Other | Admitting: Certified Registered Nurse Anesthetist

## 2016-06-25 ENCOUNTER — Inpatient Hospital Stay (HOSPITAL_COMMUNITY): Payer: Medicare Other

## 2016-06-25 ENCOUNTER — Encounter (HOSPITAL_COMMUNITY): Payer: Self-pay | Admitting: *Deleted

## 2016-06-25 ENCOUNTER — Inpatient Hospital Stay (HOSPITAL_COMMUNITY)
Admission: RE | Admit: 2016-06-25 | Discharge: 2016-06-27 | DRG: 454 | Disposition: A | Discharge status: Rehabilitation Facility | Payer: Medicare Other | Source: Ambulatory Visit | Attending: Neurological Surgery | Admitting: Neurological Surgery

## 2016-06-25 ENCOUNTER — Other Ambulatory Visit: Payer: Self-pay

## 2016-06-25 DIAGNOSIS — E785 Hyperlipidemia, unspecified: Secondary | ICD-10-CM | POA: Diagnosis present

## 2016-06-25 DIAGNOSIS — M5116 Intervertebral disc disorders with radiculopathy, lumbar region: Principal | ICD-10-CM | POA: Diagnosis present

## 2016-06-25 DIAGNOSIS — M5126 Other intervertebral disc displacement, lumbar region: Secondary | ICD-10-CM

## 2016-06-25 DIAGNOSIS — I10 Essential (primary) hypertension: Secondary | ICD-10-CM | POA: Diagnosis not present

## 2016-06-25 DIAGNOSIS — M48061 Spinal stenosis, lumbar region without neurogenic claudication: Secondary | ICD-10-CM | POA: Diagnosis present

## 2016-06-25 DIAGNOSIS — M5416 Radiculopathy, lumbar region: Secondary | ICD-10-CM | POA: Diagnosis not present

## 2016-06-25 DIAGNOSIS — F419 Anxiety disorder, unspecified: Secondary | ICD-10-CM | POA: Diagnosis present

## 2016-06-25 DIAGNOSIS — Z79899 Other long term (current) drug therapy: Secondary | ICD-10-CM

## 2016-06-25 DIAGNOSIS — E1122 Type 2 diabetes mellitus with diabetic chronic kidney disease: Secondary | ICD-10-CM | POA: Diagnosis not present

## 2016-06-25 DIAGNOSIS — Z87891 Personal history of nicotine dependence: Secondary | ICD-10-CM | POA: Diagnosis not present

## 2016-06-25 DIAGNOSIS — M797 Fibromyalgia: Secondary | ICD-10-CM | POA: Diagnosis present

## 2016-06-25 DIAGNOSIS — D62 Acute posthemorrhagic anemia: Secondary | ICD-10-CM | POA: Diagnosis not present

## 2016-06-25 DIAGNOSIS — M48062 Spinal stenosis, lumbar region with neurogenic claudication: Secondary | ICD-10-CM | POA: Diagnosis not present

## 2016-06-25 DIAGNOSIS — D573 Sickle-cell trait: Secondary | ICD-10-CM | POA: Diagnosis present

## 2016-06-25 DIAGNOSIS — Z90710 Acquired absence of both cervix and uterus: Secondary | ICD-10-CM | POA: Diagnosis not present

## 2016-06-25 DIAGNOSIS — E039 Hypothyroidism, unspecified: Secondary | ICD-10-CM | POA: Diagnosis present

## 2016-06-25 DIAGNOSIS — J45909 Unspecified asthma, uncomplicated: Secondary | ICD-10-CM | POA: Diagnosis present

## 2016-06-25 DIAGNOSIS — H3530 Unspecified macular degeneration: Secondary | ICD-10-CM | POA: Diagnosis present

## 2016-06-25 DIAGNOSIS — G4730 Sleep apnea, unspecified: Secondary | ICD-10-CM | POA: Diagnosis present

## 2016-06-25 DIAGNOSIS — G8918 Other acute postprocedural pain: Secondary | ICD-10-CM | POA: Diagnosis not present

## 2016-06-25 DIAGNOSIS — Z419 Encounter for procedure for purposes other than remedying health state, unspecified: Secondary | ICD-10-CM | POA: Diagnosis not present

## 2016-06-25 DIAGNOSIS — K219 Gastro-esophageal reflux disease without esophagitis: Secondary | ICD-10-CM | POA: Diagnosis present

## 2016-06-25 DIAGNOSIS — Z9889 Other specified postprocedural states: Secondary | ICD-10-CM | POA: Diagnosis not present

## 2016-06-25 DIAGNOSIS — M4726 Other spondylosis with radiculopathy, lumbar region: Secondary | ICD-10-CM | POA: Diagnosis not present

## 2016-06-25 DIAGNOSIS — N179 Acute kidney failure, unspecified: Secondary | ICD-10-CM | POA: Diagnosis not present

## 2016-06-25 DIAGNOSIS — I129 Hypertensive chronic kidney disease with stage 1 through stage 4 chronic kidney disease, or unspecified chronic kidney disease: Secondary | ICD-10-CM | POA: Diagnosis not present

## 2016-06-25 DIAGNOSIS — M4316 Spondylolisthesis, lumbar region: Secondary | ICD-10-CM | POA: Diagnosis not present

## 2016-06-25 DIAGNOSIS — M4326 Fusion of spine, lumbar region: Secondary | ICD-10-CM | POA: Diagnosis not present

## 2016-06-25 DIAGNOSIS — R7309 Other abnormal glucose: Secondary | ICD-10-CM | POA: Diagnosis not present

## 2016-06-25 DIAGNOSIS — E1140 Type 2 diabetes mellitus with diabetic neuropathy, unspecified: Secondary | ICD-10-CM | POA: Diagnosis not present

## 2016-06-25 DIAGNOSIS — E876 Hypokalemia: Secondary | ICD-10-CM | POA: Diagnosis present

## 2016-06-25 DIAGNOSIS — M79609 Pain in unspecified limb: Secondary | ICD-10-CM | POA: Diagnosis not present

## 2016-06-25 DIAGNOSIS — Z888 Allergy status to other drugs, medicaments and biological substances status: Secondary | ICD-10-CM | POA: Diagnosis not present

## 2016-06-25 DIAGNOSIS — T380X5A Adverse effect of glucocorticoids and synthetic analogues, initial encounter: Secondary | ICD-10-CM | POA: Diagnosis not present

## 2016-06-25 DIAGNOSIS — E1142 Type 2 diabetes mellitus with diabetic polyneuropathy: Secondary | ICD-10-CM | POA: Diagnosis present

## 2016-06-25 DIAGNOSIS — G4700 Insomnia, unspecified: Secondary | ICD-10-CM | POA: Diagnosis present

## 2016-06-25 DIAGNOSIS — N183 Chronic kidney disease, stage 3 (moderate): Secondary | ICD-10-CM | POA: Diagnosis not present

## 2016-06-25 DIAGNOSIS — E1165 Type 2 diabetes mellitus with hyperglycemia: Secondary | ICD-10-CM | POA: Diagnosis not present

## 2016-06-25 DIAGNOSIS — M545 Low back pain: Secondary | ICD-10-CM | POA: Diagnosis not present

## 2016-06-25 DIAGNOSIS — Z794 Long term (current) use of insulin: Secondary | ICD-10-CM | POA: Diagnosis not present

## 2016-06-25 DIAGNOSIS — Z7982 Long term (current) use of aspirin: Secondary | ICD-10-CM | POA: Diagnosis not present

## 2016-06-25 DIAGNOSIS — K5900 Constipation, unspecified: Secondary | ICD-10-CM | POA: Diagnosis present

## 2016-06-25 DIAGNOSIS — Z981 Arthrodesis status: Secondary | ICD-10-CM | POA: Diagnosis not present

## 2016-06-25 DIAGNOSIS — R011 Cardiac murmur, unspecified: Secondary | ICD-10-CM | POA: Diagnosis present

## 2016-06-25 DIAGNOSIS — K5903 Drug induced constipation: Secondary | ICD-10-CM | POA: Diagnosis not present

## 2016-06-25 HISTORY — DX: Anxiety disorder, unspecified: F41.9

## 2016-06-25 HISTORY — DX: Other intervertebral disc displacement, lumbar region: M51.26

## 2016-06-25 HISTORY — DX: Hypothyroidism, unspecified: E03.9

## 2016-06-25 HISTORY — DX: Pneumonia, unspecified organism: J18.9

## 2016-06-25 LAB — BASIC METABOLIC PANEL
Anion gap: 9 (ref 5–15)
BUN: 22 mg/dL — AB (ref 6–20)
CALCIUM: 9.3 mg/dL (ref 8.9–10.3)
CO2: 30 mmol/L (ref 22–32)
Chloride: 102 mmol/L (ref 101–111)
Creatinine, Ser: 1.17 mg/dL — ABNORMAL HIGH (ref 0.44–1.00)
GFR calc Af Amer: 51 mL/min — ABNORMAL LOW (ref >60)
GFR, EST NON AFRICAN AMERICAN: 44 mL/min — AB (ref >60)
Glucose, Bld: 111 mg/dL — ABNORMAL HIGH (ref 65–99)
POTASSIUM: 3.2 mmol/L — AB (ref 3.5–5.1)
SODIUM: 141 mmol/L (ref 135–145)

## 2016-06-25 LAB — CBC
HCT: 35.7 % — ABNORMAL LOW (ref 36.0–46.0)
HEMOGLOBIN: 11.7 g/dL — AB (ref 12.0–15.0)
MCH: 27.5 pg (ref 26.0–34.0)
MCHC: 32.8 g/dL (ref 30.0–36.0)
MCV: 83.8 fL (ref 78.0–100.0)
Platelets: 196 10*3/uL (ref 150–400)
RBC: 4.26 MIL/uL (ref 3.87–5.11)
RDW: 14.6 % (ref 11.5–15.5)
WBC: 10.6 10*3/uL — AB (ref 4.0–10.5)

## 2016-06-25 LAB — GLUCOSE, CAPILLARY
GLUCOSE-CAPILLARY: 114 mg/dL — AB (ref 65–99)
GLUCOSE-CAPILLARY: 94 mg/dL (ref 65–99)
GLUCOSE-CAPILLARY: 211 mg/dL — AB (ref 65–99)
Glucose-Capillary: 83 mg/dL (ref 65–99)
Glucose-Capillary: 69 mg/dL (ref 65–99)
Glucose-Capillary: 83 mg/dL (ref 65–99)

## 2016-06-25 LAB — TYPE AND SCREEN
ABO/RH(D): B POS
ANTIBODY SCREEN: NEGATIVE

## 2016-06-25 LAB — SURGICAL PCR SCREEN
MRSA, PCR: NEGATIVE
STAPHYLOCOCCUS AUREUS: NEGATIVE

## 2016-06-25 SURGERY — POSTERIOR LUMBAR FUSION 1 LEVEL
Anesthesia: General | Site: Spine Lumbar

## 2016-06-25 MED ORDER — SUGAMMADEX SODIUM 200 MG/2ML IV SOLN
INTRAVENOUS | Status: DC | PRN
  Administered 2016-06-25: 160 mg via INTRAVENOUS

## 2016-06-25 MED ORDER — HYDROMORPHONE HCL 1 MG/ML IJ SOLN
0.2500 mg | INTRAMUSCULAR | Status: DC | PRN
  Administered 2016-06-25 (×2): 0.5 mg via INTRAVENOUS

## 2016-06-25 MED ORDER — CEFAZOLIN SODIUM-DEXTROSE 2-4 GM/100ML-% IV SOLN
2.0000 g | Freq: Three times a day (TID) | INTRAVENOUS | Status: AC
  Administered 2016-06-25 – 2016-06-26 (×2): 2 g via INTRAVENOUS
  Filled 2016-06-25 (×2): qty 100

## 2016-06-25 MED ORDER — ONDANSETRON HCL 4 MG/2ML IJ SOLN
INTRAMUSCULAR | Status: DC | PRN
  Administered 2016-06-25: 4 mg via INTRAVENOUS

## 2016-06-25 MED ORDER — HYDROMORPHONE HCL 1 MG/ML IJ SOLN
1.0000 mg | INTRAMUSCULAR | Status: DC | PRN
  Administered 2016-06-25 – 2016-06-26 (×3): 1 mg via INTRAVENOUS
  Filled 2016-06-25 (×3): qty 1

## 2016-06-25 MED ORDER — ARTIFICIAL TEARS OP OINT
TOPICAL_OINTMENT | OPHTHALMIC | Status: DC | PRN
  Administered 2016-06-25: 1 via OPHTHALMIC

## 2016-06-25 MED ORDER — SODIUM CHLORIDE 0.9% FLUSH: 3.0000 mL | INTRAVENOUS | Status: DC | PRN

## 2016-06-25 MED ORDER — CEFAZOLIN SODIUM-DEXTROSE 2-4 GM/100ML-% IV SOLN
INTRAVENOUS | Status: AC
  Filled 2016-06-25: qty 100

## 2016-06-25 MED ORDER — THROMBIN 20000 UNITS EX SOLR
CUTANEOUS | Status: DC | PRN
  Administered 2016-06-25: 16:00:00 via TOPICAL

## 2016-06-25 MED ORDER — EZETIMIBE 10 MG PO TABS
10.0000 mg | ORAL_TABLET | Freq: Every day | ORAL | Status: DC
  Administered 2016-06-26 – 2016-06-27 (×2): 10 mg via ORAL
  Filled 2016-06-25 (×2): qty 1

## 2016-06-25 MED ORDER — LIDOCAINE HCL (CARDIAC) 20 MG/ML IV SOLN
INTRAVENOUS | Status: DC | PRN
  Administered 2016-06-25: 100 mg via INTRAVENOUS

## 2016-06-25 MED ORDER — BUPIVACAINE HCL (PF) 0.5 % IJ SOLN
INTRAMUSCULAR | Status: DC | PRN
  Administered 2016-06-25: 5 mL
  Administered 2016-06-25: 20 mL

## 2016-06-25 MED ORDER — HYDROCODONE-ACETAMINOPHEN 5-325 MG PO TABS: 1.0000 | ORAL_TABLET | Freq: Four times a day (QID) | ORAL | Status: DC | PRN

## 2016-06-25 MED ORDER — CEFAZOLIN SODIUM-DEXTROSE 2-4 GM/100ML-% IV SOLN
2.0000 g | INTRAVENOUS | Status: AC
  Administered 2016-06-25: 2 g via INTRAVENOUS

## 2016-06-25 MED ORDER — GABAPENTIN 300 MG PO CAPS
300.0000 mg | ORAL_CAPSULE | Freq: Three times a day (TID) | ORAL | Status: DC
  Administered 2016-06-25 – 2016-06-27 (×6): 300 mg via ORAL
  Filled 2016-06-25 (×6): qty 1

## 2016-06-25 MED ORDER — SENNA 8.6 MG PO TABS
1.0000 | ORAL_TABLET | Freq: Two times a day (BID) | ORAL | Status: DC
  Administered 2016-06-25 – 2016-06-27 (×4): 8.6 mg via ORAL
  Filled 2016-06-25 (×4): qty 1

## 2016-06-25 MED ORDER — SUCCINYLCHOLINE CHLORIDE 20 MG/ML IJ SOLN
INTRAMUSCULAR | Status: DC | PRN
  Administered 2016-06-25: 120 mg via INTRAVENOUS

## 2016-06-25 MED ORDER — MUPIROCIN 2 % EX OINT
1.0000 "application " | TOPICAL_OINTMENT | Freq: Once | CUTANEOUS | Status: AC
  Administered 2016-06-25: 1 via TOPICAL

## 2016-06-25 MED ORDER — SODIUM CHLORIDE 0.9 % IV SOLN: 250.0000 mL | INTRAVENOUS | Status: DC

## 2016-06-25 MED ORDER — FLEET ENEMA 7-19 GM/118ML RE ENEM
1.0000 | ENEMA | Freq: Once | RECTAL | Status: DC | PRN
Start: 2016-06-25 — End: 2016-06-27

## 2016-06-25 MED ORDER — DOCUSATE SODIUM 100 MG PO CAPS
100.0000 mg | ORAL_CAPSULE | Freq: Two times a day (BID) | ORAL | Status: DC
  Administered 2016-06-25 – 2016-06-27 (×4): 100 mg via ORAL
  Filled 2016-06-25 (×4): qty 1

## 2016-06-25 MED ORDER — VALSARTAN-HYDROCHLOROTHIAZIDE 320-12.5 MG PO TABS: 1.0000 | ORAL_TABLET | Freq: Every day | ORAL | Status: DC

## 2016-06-25 MED ORDER — LORATADINE 10 MG PO TABS
10.0000 mg | ORAL_TABLET | Freq: Every day | ORAL | Status: DC
  Administered 2016-06-26 – 2016-06-27 (×2): 10 mg via ORAL
  Filled 2016-06-25 (×2): qty 1

## 2016-06-25 MED ORDER — HYDROMORPHONE HCL 1 MG/ML IJ SOLN
INTRAMUSCULAR | Status: AC
  Filled 2016-06-25: qty 1

## 2016-06-25 MED ORDER — PHENYLEPHRINE HCL 10 MG/ML IJ SOLN
INTRAVENOUS | Status: DC | PRN
  Administered 2016-06-25: 25 ug/min via INTRAVENOUS

## 2016-06-25 MED ORDER — THROMBIN 20000 UNITS EX SOLR
CUTANEOUS | Status: AC
  Filled 2016-06-25: qty 20000

## 2016-06-25 MED ORDER — CHLORHEXIDINE GLUCONATE CLOTH 2 % EX PADS: 6.0000 | MEDICATED_PAD | Freq: Once | CUTANEOUS | Status: DC

## 2016-06-25 MED ORDER — FENTANYL CITRATE (PF) 100 MCG/2ML IJ SOLN
INTRAMUSCULAR | Status: AC
  Filled 2016-06-25: qty 2

## 2016-06-25 MED ORDER — MUPIROCIN 2 % EX OINT
TOPICAL_OINTMENT | CUTANEOUS | Status: AC
  Filled 2016-06-25: qty 22

## 2016-06-25 MED ORDER — ONDANSETRON HCL 4 MG PO TABS: 4.0000 mg | ORAL_TABLET | Freq: Four times a day (QID) | ORAL | Status: DC | PRN

## 2016-06-25 MED ORDER — ACETAMINOPHEN 325 MG PO TABS
650.0000 mg | ORAL_TABLET | ORAL | Status: DC | PRN
  Filled 2016-06-25: qty 2

## 2016-06-25 MED ORDER — GLIMEPIRIDE 2 MG PO TABS
2.0000 mg | ORAL_TABLET | Freq: Every day | ORAL | Status: DC
  Administered 2016-06-26 – 2016-06-27 (×2): 2 mg via ORAL
  Filled 2016-06-25 (×2): qty 1

## 2016-06-25 MED ORDER — 0.9 % SODIUM CHLORIDE (POUR BTL) OPTIME
TOPICAL | Status: DC | PRN
  Administered 2016-06-25: 1000 mL

## 2016-06-25 MED ORDER — ONDANSETRON HCL 4 MG/2ML IJ SOLN: 4.0000 mg | Freq: Four times a day (QID) | INTRAMUSCULAR | Status: DC | PRN

## 2016-06-25 MED ORDER — PROMETHAZINE HCL 25 MG/ML IJ SOLN: 6.2500 mg | INTRAMUSCULAR | Status: DC | PRN

## 2016-06-25 MED ORDER — SODIUM CHLORIDE 0.9% FLUSH
3.0000 mL | Freq: Two times a day (BID) | INTRAVENOUS | Status: DC
  Administered 2016-06-25 – 2016-06-26 (×3): 3 mL via INTRAVENOUS

## 2016-06-25 MED ORDER — POLYETHYLENE GLYCOL 3350 17 G PO PACK
17.0000 g | PACK | Freq: Every day | ORAL | Status: DC | PRN
  Administered 2016-06-26: 17 g via ORAL
  Filled 2016-06-25: qty 1

## 2016-06-25 MED ORDER — THROMBIN 5000 UNITS EX SOLR
OROMUCOSAL | Status: DC | PRN
  Administered 2016-06-25: 16:00:00 via TOPICAL

## 2016-06-25 MED ORDER — PHENYLEPHRINE HCL 10 MG/ML IJ SOLN
INTRAMUSCULAR | Status: DC | PRN
  Administered 2016-06-25: 80 ug via INTRAVENOUS

## 2016-06-25 MED ORDER — MENTHOL 3 MG MT LOZG
1.0000 | LOZENGE | OROMUCOSAL | Status: DC | PRN
  Filled 2016-06-25: qty 9

## 2016-06-25 MED ORDER — DEXAMETHASONE SODIUM PHOSPHATE 10 MG/ML IJ SOLN
INTRAMUSCULAR | Status: AC
  Filled 2016-06-25: qty 1

## 2016-06-25 MED ORDER — LACTATED RINGERS IV SOLN
INTRAVENOUS | Status: DC
Start: 2016-06-25 — End: 2016-06-25
  Administered 2016-06-25 (×3): via INTRAVENOUS

## 2016-06-25 MED ORDER — ACETAMINOPHEN 650 MG RE SUPP: 650.0000 mg | RECTAL | Status: DC | PRN

## 2016-06-25 MED ORDER — PHENYLEPHRINE 40 MCG/ML (10ML) SYRINGE FOR IV PUSH (FOR BLOOD PRESSURE SUPPORT)
PREFILLED_SYRINGE | INTRAVENOUS | Status: AC
  Filled 2016-06-25: qty 10

## 2016-06-25 MED ORDER — DEXAMETHASONE SODIUM PHOSPHATE 10 MG/ML IJ SOLN
INTRAMUSCULAR | Status: DC | PRN
  Administered 2016-06-25: 8 mg via INTRAVENOUS

## 2016-06-25 MED ORDER — FENTANYL CITRATE (PF) 100 MCG/2ML IJ SOLN
INTRAMUSCULAR | Status: DC | PRN
  Administered 2016-06-25 (×3): 50 ug via INTRAVENOUS
  Administered 2016-06-25: 25 ug via INTRAVENOUS
  Administered 2016-06-25: 50 ug via INTRAVENOUS

## 2016-06-25 MED ORDER — ONDANSETRON HCL 4 MG/2ML IJ SOLN
INTRAMUSCULAR | Status: AC
  Filled 2016-06-25: qty 2

## 2016-06-25 MED ORDER — PREGABALIN 50 MG PO CAPS
50.0000 mg | ORAL_CAPSULE | Freq: Every evening | ORAL | Status: DC | PRN
  Administered 2016-06-26: 50 mg via ORAL
  Filled 2016-06-25: qty 1

## 2016-06-25 MED ORDER — PROPOFOL 10 MG/ML IV BOLUS
INTRAVENOUS | Status: AC
  Filled 2016-06-25: qty 20

## 2016-06-25 MED ORDER — BISACODYL 10 MG RE SUPP: 10.0000 mg | Freq: Every day | RECTAL | Status: DC | PRN

## 2016-06-25 MED ORDER — BACLOFEN 10 MG PO TABS
10.0000 mg | ORAL_TABLET | Freq: Every day | ORAL | Status: DC
  Administered 2016-06-25 – 2016-06-26 (×2): 10 mg via ORAL
  Filled 2016-06-25 (×2): qty 1

## 2016-06-25 MED ORDER — PHENOL 1.4 % MT LIQD: 1.0000 | OROMUCOSAL | Status: DC | PRN

## 2016-06-25 MED ORDER — PROPOFOL 10 MG/ML IV BOLUS
INTRAVENOUS | Status: DC | PRN
  Administered 2016-06-25: 170 mg via INTRAVENOUS

## 2016-06-25 MED ORDER — CALCIUM CARBONATE ANTACID 500 MG PO CHEW: 1.0000 | CHEWABLE_TABLET | Freq: Two times a day (BID) | ORAL | Status: DC | PRN

## 2016-06-25 MED ORDER — MOMETASONE FURO-FORMOTEROL FUM 100-5 MCG/ACT IN AERO
2.0000 | INHALATION_SPRAY | Freq: Two times a day (BID) | RESPIRATORY_TRACT | Status: DC
  Administered 2016-06-25 – 2016-06-27 (×4): 2 via RESPIRATORY_TRACT
  Filled 2016-06-25: qty 8.8

## 2016-06-25 MED ORDER — ALBUTEROL SULFATE (2.5 MG/3ML) 0.083% IN NEBU
3.0000 mL | INHALATION_SOLUTION | Freq: Three times a day (TID) | RESPIRATORY_TRACT | Status: DC | PRN
Start: 2016-06-25 — End: 2016-06-27

## 2016-06-25 MED ORDER — LEVOTHYROXINE SODIUM 25 MCG PO TABS
25.0000 ug | ORAL_TABLET | Freq: Every day | ORAL | Status: DC
  Administered 2016-06-26 – 2016-06-27 (×2): 25 ug via ORAL
  Filled 2016-06-25 (×2): qty 1

## 2016-06-25 MED ORDER — ARTIFICIAL TEARS OP OINT
TOPICAL_OINTMENT | OPHTHALMIC | Status: AC
  Filled 2016-06-25: qty 3.5

## 2016-06-25 MED ORDER — SODIUM CHLORIDE 0.9 % IV SOLN
INTRAVENOUS | Status: DC
Start: 2016-06-25 — End: 2016-06-27
  Administered 2016-06-25 – 2016-06-26 (×2): via INTRAVENOUS

## 2016-06-25 MED ORDER — FENTANYL CITRATE (PF) 100 MCG/2ML IJ SOLN
INTRAMUSCULAR | Status: AC
  Filled 2016-06-25: qty 4

## 2016-06-25 MED ORDER — IRBESARTAN 300 MG PO TABS
300.0000 mg | ORAL_TABLET | Freq: Every day | ORAL | Status: DC
  Filled 2016-06-25: qty 1

## 2016-06-25 MED ORDER — HYDROCHLOROTHIAZIDE 12.5 MG PO CAPS
12.5000 mg | ORAL_CAPSULE | Freq: Every day | ORAL | Status: DC
  Filled 2016-06-25: qty 1

## 2016-06-25 MED ORDER — LIDOCAINE-EPINEPHRINE 1 %-1:100000 IJ SOLN
INTRAMUSCULAR | Status: DC | PRN
  Administered 2016-06-25: 5 mL

## 2016-06-25 MED ORDER — LIDOCAINE-EPINEPHRINE 2 %-1:100000 IJ SOLN
INTRAMUSCULAR | Status: AC
  Filled 2016-06-25: qty 1

## 2016-06-25 MED ORDER — HYDROCODONE-ACETAMINOPHEN 5-325 MG PO TABS
1.0000 | ORAL_TABLET | ORAL | Status: DC | PRN
  Administered 2016-06-26 – 2016-06-27 (×6): 2 via ORAL
  Filled 2016-06-25 (×7): qty 2

## 2016-06-25 MED ORDER — ALUM & MAG HYDROXIDE-SIMETH 200-200-20 MG/5ML PO SUSP: 30.0000 mL | Freq: Four times a day (QID) | ORAL | Status: DC | PRN

## 2016-06-25 MED ORDER — SODIUM CHLORIDE 0.9 % IR SOLN
Status: DC | PRN
  Administered 2016-06-25: 16:00:00

## 2016-06-25 MED ORDER — SUGAMMADEX SODIUM 200 MG/2ML IV SOLN
INTRAVENOUS | Status: AC
  Filled 2016-06-25: qty 2

## 2016-06-25 MED ORDER — ROCURONIUM BROMIDE 100 MG/10ML IV SOLN
INTRAVENOUS | Status: DC | PRN
  Administered 2016-06-25: 50 mg via INTRAVENOUS

## 2016-06-25 MED ORDER — THROMBIN 5000 UNITS EX SOLR
CUTANEOUS | Status: AC
  Filled 2016-06-25: qty 5000

## 2016-06-25 MED ORDER — INSULIN DETEMIR 100 UNIT/ML FLEXPEN: PEN_INJECTOR | SUBCUTANEOUS | 0 refills | Status: DC

## 2016-06-25 SURGICAL SUPPLY — 64 items
ADH SKN CLS APL DERMABOND .7 (GAUZE/BANDAGES/DRESSINGS) ×1
APPLICATOR CHLORAPREP 3ML ORNG (MISCELLANEOUS) ×1 IMPLANT
BAG DECANTER FOR FLEXI CONT (MISCELLANEOUS) ×2 IMPLANT
BASKET BONE COLLECTION (BASKET) ×2 IMPLANT
BONE CANC CHIPS 40CC CAN1/2 (Bone Implant) ×2 IMPLANT
BUR MATCHSTICK NEURO 3.0 LAGG (BURR) ×2 IMPLANT
CAGE COROENT MP 8X9X23M-8 SPIN (Cage) ×2 IMPLANT
CANISTER SUCT 3000ML PPV (MISCELLANEOUS) ×2 IMPLANT
CHIPS CANC BONE 40CC CAN1/2 (Bone Implant) ×1 IMPLANT
CONT SPEC 4OZ CLIKSEAL STRL BL (MISCELLANEOUS) ×2 IMPLANT
COVER BACK TABLE 60X90IN (DRAPES) ×2 IMPLANT
DERMABOND ADVANCED (GAUZE/BANDAGES/DRESSINGS) ×1
DERMABOND ADVANCED .7 DNX12 (GAUZE/BANDAGES/DRESSINGS) ×1 IMPLANT
DEVICE DISSECT PLASMABLAD 3.0S (MISCELLANEOUS) ×1 IMPLANT
DIFFUSER DRILL AIR PNEUMATIC (MISCELLANEOUS) ×2 IMPLANT
DRAPE C-ARM 42X72 X-RAY (DRAPES) ×4 IMPLANT
DRAPE HALF SHEET 40X57 (DRAPES) ×1 IMPLANT
DRAPE LAPAROTOMY 100X72X124 (DRAPES) ×2 IMPLANT
DRAPE POUCH INSTRU U-SHP 10X18 (DRAPES) ×2 IMPLANT
ELECT REM PT RETURN 9FT ADLT (ELECTROSURGICAL) ×2
ELECTRODE REM PT RTRN 9FT ADLT (ELECTROSURGICAL) ×1 IMPLANT
GAUZE SPONGE 4X4 12PLY STRL (GAUZE/BANDAGES/DRESSINGS) ×2 IMPLANT
GAUZE SPONGE 4X4 16PLY XRAY LF (GAUZE/BANDAGES/DRESSINGS) IMPLANT
GLOVE BIOGEL PI IND STRL 7.5 (GLOVE) IMPLANT
GLOVE BIOGEL PI IND STRL 8 (GLOVE) IMPLANT
GLOVE BIOGEL PI IND STRL 8.5 (GLOVE) ×2 IMPLANT
GLOVE BIOGEL PI INDICATOR 7.5 (GLOVE) ×2
GLOVE BIOGEL PI INDICATOR 8 (GLOVE) ×1
GLOVE BIOGEL PI INDICATOR 8.5 (GLOVE) ×4
GLOVE ECLIPSE 8.0 STRL XLNG CF (GLOVE) ×3 IMPLANT
GLOVE ECLIPSE 8.5 STRL (GLOVE) ×4 IMPLANT
GLOVE SURG SS PI 7.5 STRL IVOR (GLOVE) ×1 IMPLANT
GOWN STRL REUS W/ TWL LRG LVL3 (GOWN DISPOSABLE) IMPLANT
GOWN STRL REUS W/ TWL XL LVL3 (GOWN DISPOSABLE) IMPLANT
GOWN STRL REUS W/TWL 2XL LVL3 (GOWN DISPOSABLE) ×6 IMPLANT
GOWN STRL REUS W/TWL LRG LVL3 (GOWN DISPOSABLE) ×2
GOWN STRL REUS W/TWL XL LVL3 (GOWN DISPOSABLE) ×2
GRAFT BNE CHIP CANC 1-8 40 (Bone Implant) IMPLANT
HEMOSTAT POWDER KIT SURGIFOAM (HEMOSTASIS) ×1 IMPLANT
KIT BASIN OR (CUSTOM PROCEDURE TRAY) ×2 IMPLANT
KIT ROOM TURNOVER OR (KITS) ×2 IMPLANT
MILL MEDIUM DISP (BLADE) ×1 IMPLANT
MODULE POWER NUVASIVE (MISCELLANEOUS) IMPLANT
NEEDLE HYPO 22GX1.5 SAFETY (NEEDLE) ×2 IMPLANT
NS IRRIG 1000ML POUR BTL (IV SOLUTION) ×2 IMPLANT
PACK LAMINECTOMY NEURO (CUSTOM PROCEDURE TRAY) ×2 IMPLANT
PAD ARMBOARD 7.5X6 YLW CONV (MISCELLANEOUS) ×6 IMPLANT
PATTIES SURGICAL .5 X1 (DISPOSABLE) ×2 IMPLANT
PLASMABLADE 3.0S (MISCELLANEOUS) ×2
POWER MODULE NUVASIVE (MISCELLANEOUS) ×2
ROD RELINE 5.5X90MM LORDOTIC (Rod) ×2 IMPLANT
SCREW LOCK RELINE 5.5 TULIP (Screw) ×2 IMPLANT
SCREW RELINE-O POLY 6.5X45 (Screw) ×2 IMPLANT
SPONGE LAP 4X18 X RAY DECT (DISPOSABLE) IMPLANT
SPONGE SURGIFOAM ABS GEL 100 (HEMOSTASIS) ×2 IMPLANT
SUT VIC AB 1 CT1 18XBRD ANBCTR (SUTURE) ×1 IMPLANT
SUT VIC AB 1 CT1 8-18 (SUTURE) ×2
SUT VIC AB 2-0 CP2 18 (SUTURE) ×2 IMPLANT
SUT VIC AB 3-0 SH 8-18 (SUTURE) ×2 IMPLANT
SYR 3ML LL SCALE MARK (SYRINGE) ×8 IMPLANT
TOWEL GREEN STERILE (TOWEL DISPOSABLE) ×2 IMPLANT
TOWEL GREEN STERILE FF (TOWEL DISPOSABLE) ×2 IMPLANT
TRAY FOLEY W/METER SILVER 16FR (SET/KITS/TRAYS/PACK) ×2 IMPLANT
WATER STERILE IRR 1000ML POUR (IV SOLUTION) ×2 IMPLANT

## 2016-06-25 NOTE — Op Note (Signed)
Date of surgery: 06/25/2016 Preoperative diagnosis: Herniated nucleus pulposus L2-L3 with retrolisthesis L2-L3, status post arthrodesis L3-L5.  Postoperative diagnosis: Same Procedure: Bilateral laminotomies and discectomy L2-3 with decompression of central canal L2 and L3 nerve roots more work than required for simple interbody technique. Posterior lumbar interbody arthrodesis using peek spacers local autograft and allograft. Pedicle screw fixation L2 to previous fixation L3-L5. Posterior lateral arthrodesis with local autograft and allograft. Surgeon: Kristeen Miss First assistant: Cyndy Freeze M.D. Anesthesia: Gen. endotracheal Indications: [name] is a 76 year old lady who's had significant back and bilateral leg pain this has become acutely severe and she was found to have a large herniated nucleus pulposus at the level of L2-L3 with retrolisthesis at that area accentuating a severe stenosis. She is advised regarding the need for surgical decompression and stabilization of the L2-L3 joint.  Procedure: Patient was brought to the operating room supine on a stretcher. After the smooth induction of general endotracheal anesthesia, she was turned prone. The back was prepped with alcohol and DuraPrep and draped in a sterile fashion. The superior portion of the previously made incision was opened and dissected down to the lumbar dorsal fascia. Fascia was opened on the side of midline and ultimately the cross connector would've her previous fusion was identified this was then traced out to explore the region of the screw placement and L3. The incision was extended superiorly to expose the L2 spinous process and L2 laminar arches which were dissected in a subperiosteal fashion. Self-retaining retractor was placed in the wound. Dissection was carried out over the region of the facet joint transverse process of L2 was isolated as was transverse process of L3. Then laminectomies were created removing the  inferior margin lamina of L2 out turning during the entirety of the facet joint at the L2-L3 interval. Once these fragments of bone were removed the underlying redundant thickened yellow ligament was removed and this exposed the common dural tube she was carefully dissected from the ligament and this allowed for decompression of the central portion of the canal. In the region of the disc space was identified and was noted to be a substantial mass pushing dorsally this was carefully dissected and the mass was then incised and several sizable fragments of disc were encountered. These were resected. Then dissection was carried out on both sides to expose the underlying disc material and the interspace at the L2-3 level. Discectomy was performed removing substantial quantities of severely degenerated and desiccated disc material. All time care was taken to protect the path of the L2 nerve root superiorly make sugars adequately decompressed as well as decompressing the L3 nerve root inferiorly. With this the interspace was then size once all the disc was removed and the endplates were curettaged is felt that an 8 mm 8 lordotic spacer measuring 23 mm in length would fit best into this interval. This was filled with autograft and allograft along with 9 mL of bone that was filled and placed into the interspace. Spacers were placed and checked radiographically. Then pedicle entry sites were chosen at L2 and 6.5 x 45 mm screws were placed after tapping and sounding the holes to make sure there is no evidence of cutout. The previously placed hardware was then explored and the transverse connector was removed and the rods were removed. Reattachment of the rods was then completed by placing rods between L2-L3 and L5 on one side and then L2 L3 L4 and L5 on the other. 90 mm precontoured rods were  used for this purpose. The system was then torqued down in a neutral construct. Final radiographs identified good restoration of some  lordosis at the L2-L3 level. Care was taken to make sure that the L2 and L3 nerve roots were well decompressed hemostasis was well achieved before closing lumbar dorsal fascia with #1 Vicryl interrupted fashion. Additionally 6 mL of bone was packed into the lateral gutters which had been previously decorticated. Once the fascia was closed 20 mL of half percent Marcaine was injected into the paraspinous fascia and then the subcutaneous tissue was closed 2-0 Vicryl and 3-0 Vicryl was used subcuticularly. Dermabond was placed on the skin. Blood loss for the entire procedure was estimated at 200 mL. No Cell Saver her blood was returned to the patient

## 2016-06-25 NOTE — Anesthesia Procedure Notes (Signed)
Procedure Name: Intubation Date/Time: 06/25/2016 2:39 PM Performed by: Rejeana Brock L Pre-anesthesia Checklist: Patient identified, Emergency Drugs available, Suction available and Patient being monitored Patient Re-evaluated:Patient Re-evaluated prior to inductionOxygen Delivery Method: Circle System Utilized Preoxygenation: Pre-oxygenation with 100% oxygen Intubation Type: IV induction Ventilation: Mask ventilation without difficulty Laryngoscope Size: Miller and 2 Grade View: Grade II Tube type: Oral Tube size: 7.0 mm Number of attempts: 1 Airway Equipment and Method: Stylet and Oral airway Placement Confirmation: ETT inserted through vocal cords under direct vision,  positive ETCO2 and breath sounds checked- equal and bilateral Secured at: 22 cm Tube secured with: Tape Dental Injury: Teeth and Oropharynx as per pre-operative assessment  Difficulty Due To: Difficulty was anticipated and Difficult Airway- due to anterior larynx Comments: DL X 1 unsuccessful with MAC 3, grade 3. DL X 1 by Dr. Tobias Alexander successful with Sabra Heck 2, grade 2 view.

## 2016-06-25 NOTE — Transfer of Care (Signed)
Immediate Anesthesia Transfer of Care Note  Patient: [name]  Procedure(s) Performed: Procedure(s) with comments: Lumbar Two-Three Posterior lumbar interbody fusion (N/A) - L2-3 Posterior lumbar interbody fusion  Patient Location: PACU  Anesthesia Type:General  Level of Consciousness: awake and patient cooperative  Airway & Oxygen Therapy: Patient Spontanous Breathing and Patient connected to nasal cannula oxygen  Post-op Assessment: Report given to RN and Patient moving all extremities X 4  Post vital signs: Reviewed and stable  Last Vitals:  Vitals:   06/25/16 1127  BP: 139/78  Pulse: 76  Resp: 18  Temp: 37.4 C    Last Pain:  Vitals:   06/25/16 1127  TempSrc: Oral  PainSc:       Patients Stated Pain Goal: 4 ([phone])  Complications: No apparent anesthesia complications

## 2016-06-25 NOTE — H&P (Signed)
[name] is an 76 y.o. female.   Chief Complaint: Back and bilateral leg pain. HPI: Patient is a 76 year old individual is had significant decompression and arthrodesis from L4 to the sacrum in the past. She has developed further back pain now is found to have significant disc herniation at the level of L2-L3 with a retrolisthesis and a tight stenosis at that level. She's been advised regarding need for surgical decompression and stabilization of the L2-3 segment. She is now admitted for this procedure.  Past Medical History:  Diagnosis Date  . Anemia    has sickle cell trait  . Anxiety   . Arthritis   . Asthma    has used inhaler in past for asthmatic bronchitis, last time- early 2012  . Complication of anesthesia    wakes up shaking  . Diabetes mellitus   . Dyslipidemia   . Fibromyalgia   . GERD (gastroesophageal reflux disease)    occas. use of  Prilosec  . Heart murmur    sees Dr. Montez Morita, last seen- early 2012  . Hypertension    02/2010- stress test /w PCP  . Neuromuscular disorder (HCC)    lumbar radiculopathy, lumbago  . Nocturnal leg cramps 09/27/2014  . Pneumonia   . Sickle cell trait ([place])   . Sleep apnea    borderline sleep apnea, states she no longer uses, early 2012- stopped using     Past Surgical History:  Procedure Laterality Date  . ABDOMINAL HYSTERECTOMY    . adb.cyst     ovarian cyst  . BACK SURGERY     2012, 2015 (3 total)  . COLONOSCOPY    . EYE SURGERY     macular degeneration treatment - injections  . OVARIAN CYST SURGERY      Family History  Problem Relation Age of Onset  . Ovarian cancer Mother   . Cancer - Prostate Father   . Breast cancer Paternal Aunt   . Multiple myeloma Paternal Aunt   . Anesthesia problems Neg Hx   . Hypotension Neg Hx   . Malignant hyperthermia Neg Hx   . Pseudochol deficiency Neg Hx    Social History:  reports that she quit smoking about 17 years ago. She has never used smokeless tobacco. She reports that  she drinks alcohol. She reports that she does not use drugs.  Allergies:  Allergies  Allergen Reactions  . Betadine [Povidone Iodine] Swelling    Reaction to betadine eye drops  . Cymbalta [Duloxetine Hcl] Other (See Comments)    Dizziness   . Adhesive [Tape] Rash  . Lipitor [Atorvastatin] Rash    Medications Prior to Admission  Medication Sig Dispense Refill  . baclofen (LIORESAL) 10 MG tablet 1 pill upto TID if needed for muscle spasm. (Patient taking differently: Take 10 mg by mouth at bedtime. ) 90 tablet 1  . BD PEN NEEDLE NANO U/F 32G X 4 MM MISC USE AS DIRECTED 100 each 0  . calcium carbonate (TUMS EX) 750 MG chewable tablet Chew 1 tablet by mouth 2 (two) times daily as needed for heartburn (indigestion).    . Calcium-Magnesium-Zinc (CAL-MAG-ZINC PO) Take 1 tablet by mouth daily.    . cetirizine (ZYRTEC) 10 MG tablet Take 10 mg by mouth daily.    . diclofenac sodium (VOLTAREN) 1 % GEL Apply 4 g topically 4 (four) times daily. Voltaren Gel 3 grams to 3 large joints upto TID 3 TUBES with 3 refills (Patient taking differently: Apply 4 g topically 2 (  two) times daily as needed (muscle pain). ) 3 Tube 3  . ezetimibe (ZETIA) 10 MG tablet TAKE 1 TABLET(10 MG) BY MOUTH DAILY 30 tablet 0  . gabapentin (NEURONTIN) 300 MG capsule TAKE 1 CAPSULE(300 MG) BY MOUTH THREE TIMES DAILY 90 capsule 0  . glimepiride (AMARYL) 2 MG tablet TAKE 2 TABLETS BY MOUTH EVERY MORNING AND THEN TAKE 2 TABLETS EVERY EVENING 120 tablet 0  . HYDROcodone-acetaminophen (NORCO/VICODIN) 5-325 MG tablet Take 1 tablet by mouth every 6 (six) hours as needed for moderate pain. (Patient taking differently: Take 1-2 tablets by mouth every 6 (six) hours as needed for moderate pain. ) 15 tablet 0  . ibuprofen (ADVIL,MOTRIN) 800 MG tablet Take 800 mg by mouth 3 (three) times daily as needed (pain).     . Insulin Detemir (LEVEMIR FLEXTOUCH) 100 UNIT/ML Pen INJECT 14 UNITS INTO THE SKIN DAILY AT 10PM 15 mL 0  . LEVEMIR FLEXTOUCH 100  UNIT/ML Pen INJECT 8 UNITS INTO THE SKIN DAILY AT 10PM (Patient taking differently: INJECT 15 UNITS INTO THE SKIN DAILY AT 10PM) 15 mL 0  . levothyroxine (SYNTHROID, LEVOTHROID) 25 MCG tablet TAKE 1 TABLET(25 MCG) BY MOUTH DAILY BEFORE BREAKFAST (Patient taking differently: Take 12.71mg once daily in the morning) 90 tablet 0  . LYRICA 50 MG capsule Take 50 mg by mouth at bedtime as needed (pain). Reported on 07/06/2015    . NOVOLOG FLEXPEN 100 UNIT/ML FlexPen INJECT 6 UNITS UNDER THE SKIN BEFORE BREAKFAST, 10 UNITS BEFORE LUNCH, AND 9 UNITS BEFORE SUPPER 15 mL 0  . PROAIR RESPICLICK 1161(90 Base) MCG/ACT AEPB Inhale 2 puffs into the lungs 3 (three) times daily as needed (shortness of breath).     . SYMBICORT 80-4.5 MCG/ACT inhaler Inhale 2 puffs into the lungs 2 (two) times daily as needed (shortness of breath).     . valsartan-hydrochlorothiazide (DIOVAN-HCT) 320-12.5 MG tablet Take 1 tablet by mouth daily.    . vitamin B-12 (CYANOCOBALAMIN) 500 MCG tablet Take 500 mcg by mouth daily.    . vitamin E 400 UNIT capsule Take 400 Units by mouth daily.    .Marland Kitchenaspirin 81 MG EC tablet Take 862mby mouth once daily  3  . azithromycin (ZITHROMAX) 250 MG tablet Take one pill a day (Patient not taking: Reported on 06/24/2016) 4 tablet 0    Results for orders placed or performed during the hospital encounter of 06/25/16 (from the past 48 hour(s))  Type and screen     Status: None   Collection Time: 06/25/16 11:07 AM  Result Value Ref Range   ABO/RH(D) B POS    Antibody Screen NEG    Sample Expiration 06/28/2016   Glucose, capillary     Status: Abnormal   Collection Time: 06/25/16 11:18 AM  Result Value Ref Range   Glucose-Capillary 114 (H) 65 - 99 mg/dL  Basic metabolic panel     Status: Abnormal   Collection Time: 06/25/16 11:22 AM  Result Value Ref Range   Sodium 141 135 - 145 mmol/L   Potassium 3.2 (L) 3.5 - 5.1 mmol/L   Chloride 102 101 - 111 mmol/L   CO2 30 22 - 32 mmol/L   Glucose, Bld 111 (H) 65  - 99 mg/dL   BUN 22 (H) 6 - 20 mg/dL   Creatinine, Ser 1.17 (H) 0.44 - 1.00 mg/dL   Calcium 9.3 8.9 - 10.3 mg/dL   GFR calc non Af Amer 44 (L) >60 mL/min   GFR calc Af Amer 51 (  L) >60 mL/min    Comment: (NOTE) The eGFR has been calculated using the CKD EPI equation. This calculation has not been validated in all clinical situations. eGFR's persistently <60 mL/min signify possible Chronic Kidney Disease.    Anion gap 9 5 - 15  CBC     Status: Abnormal   Collection Time: 06/25/16 11:22 AM  Result Value Ref Range   WBC 10.6 (H) 4.0 - 10.5 K/uL   RBC 4.26 3.87 - 5.11 MIL/uL   Hemoglobin 11.7 (L) 12.0 - 15.0 g/dL   HCT 35.7 (L) 36.0 - 46.0 %   MCV 83.8 78.0 - 100.0 fL   MCH 27.5 26.0 - 34.0 pg   MCHC 32.8 30.0 - 36.0 g/dL   RDW 14.6 11.5 - 15.5 %   Platelets 196 150 - 400 K/uL  Surgical pcr screen     Status: None   Collection Time: 06/25/16 11:22 AM  Result Value Ref Range   MRSA, PCR NEGATIVE NEGATIVE   Staphylococcus aureus NEGATIVE NEGATIVE    Comment:        The Xpert SA Assay (FDA approved for NASAL specimens in patients over 42 years of age), is one component of a comprehensive surveillance program.  Test performance has been validated by [facility] for patients greater than or equal to 23 year old. It is not intended to diagnose infection nor to guide or monitor treatment.   Glucose, capillary     Status: None   Collection Time: 06/25/16  1:21 PM  Result Value Ref Range   Glucose-Capillary 83 65 - 99 mg/dL   Comment 1 Notify RN    Comment 2 Document in Chart    No results found.  Review of Systems  HENT: Negative.   Eyes: Negative.   Respiratory: Negative.   Cardiovascular: Negative.   Gastrointestinal: Negative.   Genitourinary: Negative.   Musculoskeletal: Positive for back pain.  Skin: Negative.   Neurological: Positive for weakness.       Weakness  Psychiatric/Behavioral: Negative.     Blood pressure 139/78, pulse 76, temperature 99.4 F (37.4  C), temperature source Oral, resp. rate 18, height '5\' 5"'  (1.651 m), weight 80.7 kg (178 lb), SpO2 96 %. Physical Exam  Constitutional: She is oriented to person, place, and time. She appears well-developed and well-nourished.  HENT:  Head: Normocephalic and atraumatic.  Eyes: EOM are normal. Pupils are equal, round, and reactive to light.  Neck: Normal range of motion. Neck supple.  Neurological: She is alert and oriented to person, place, and time.  45 strength in the iliopsoas 45 strength in quadriceps other muscle groups are 5 out of 5 including tibialis anterior and gastrocs. Absent deep tendon reflexes in the patellae and the Achilles both. Upper extremity reflexes are 1+ in the biceps 1+ triceps. Cranial nerve examination is within the limits of normal. Station and gait reveals the patient cannot stand straight and right she favors a 20 forward stoop. She walks with marked antalgia involving both lower extremities.  Skin: Skin is warm and dry.  Psychiatric: She has a normal mood and affect. Her behavior is normal. Judgment and thought content normal.     Assessment/Plan Herniated nucleus pulposus L2-L3 with degenerative spondylosis and stenosis status post arthrodesis L3 to sacrum.  Plan Olen decompression L2-3 with stabilization posterior lumbar interbody arthrodesis.  Earleen Newport, MD 06/25/2016, 2:02 PM

## 2016-06-25 NOTE — Progress Notes (Signed)
Patient ID: [name], female   DOB: February 11, 1941, 76 y.o.   MRN: [id number] Vital signs are stable Patient appears to be awakening nicely [facility] ask hospitalist to see and help manage medical issues and diabetes

## 2016-06-25 NOTE — Progress Notes (Signed)
Pt admitted from PACU post op back surgery, alert and oriented, c/o of pain with a rating of 8, pt settled in bed with family and call light at bedside, pt reassured, will however continue to monitor. Obasogie-Asidi, Nasier Thumm Efe

## 2016-06-25 NOTE — Anesthesia Postprocedure Evaluation (Signed)
Anesthesia Post Note  Patient: [name]  Procedure(s) Performed: Procedure(s) (LRB): Lumbar Two-Three Posterior lumbar interbody fusion (N/A)  Patient location during evaluation: PACU Anesthesia Type: General Level of consciousness: awake and alert Pain management: pain level controlled Vital Signs Assessment: post-procedure vital signs reviewed and stable Respiratory status: spontaneous breathing, nonlabored ventilation, respiratory function stable and patient connected to nasal cannula oxygen Cardiovascular status: blood pressure returned to baseline and stable Postop Assessment: no signs of nausea or vomiting Anesthetic complications: no       Last Vitals:  Vitals:   06/25/16 1915 06/25/16 2000  BP:  (!) 159/71  Pulse: 84 87  Resp: 14 20  Temp: 36.5 C 36.7 C    Last Pain:  Vitals:   06/25/16 2022  TempSrc:   PainSc: 8                  Kumiko Fishman,W. EDMOND

## 2016-06-25 NOTE — Consult Note (Signed)
Medical Consultation   [name]  [id number]  DOB: 1940-11-24  DOA: 06/25/2016  PCP: Wenda Low, MD   Outpatient Specialists: [facility] - neurosurg; Jannifer [facility] - neurology; [place] - cardiology; [facility] - rheumatology; [facility] - endocrinology; [facility] - ophthalmology   Requesting physician: Dr. Ellene Route  Reason for consultation: Medical management  History of Present Illness: [name] is an 76 y.o. female with h/o "borderline sleep apnea", unable to tolerate CPAP; HTN; DM; HLD; hypothyroidism; and fibromyalgia presenting with for admission for elevative surgical decompression and stabilization of disc herniation at L2-3 with retrolisthesis and tight stenosis.  She had bilateral laminotomies and discectomy L2-3 today and is already feeling much better.  Still hurting but feels better. Has DM - glucose usually 120 in AM, 180 in the evening - when not on "all these medications."  Sugars in 300s prior to admission due to prednisone. Has macular degeneration in the right eye, doing well. On Synthroid, unsure when last TSH was. Zetia for HLD. Diovan-HCT for HTN.  Review of Systems:  ROS As per HPI otherwise 10 point review of systems reviewed and negative.    Past Medical History: Past Medical History:  Diagnosis Date  . Anemia    has sickle cell trait  . Anxiety   . Arthritis   . Asthma    has used inhaler in past for asthmatic bronchitis, last time- early 2012  . Complication of anesthesia    wakes up shaking  . Diabetes mellitus   . Dyslipidemia   . Fibromyalgia   . GERD (gastroesophageal reflux disease)    occas. use of  Prilosec  . Heart murmur    sees Dr. Montez Morita, last seen- early 2012  . Hypertension    02/2010- stress test /w PCP  . Hypothyroidism   . Neuromuscular disorder (HCC)    lumbar radiculopathy, lumbago  . Nocturnal leg cramps 09/27/2014  . Pneumonia   . Sickle cell trait ([place])   . Sleep apnea    borderline sleep apnea,  states she no longer uses, early 2012- stopped using     Past Surgical History: Past Surgical History:  Procedure Laterality Date  . ABDOMINAL HYSTERECTOMY    . adb.cyst     ovarian cyst  . BACK SURGERY     2012, 2015 (3 total)  . COLONOSCOPY    . EYE SURGERY     macular degeneration treatment - injections  . OVARIAN CYST SURGERY       Allergies:   Allergies  Allergen Reactions  . Betadine [Povidone Iodine] Swelling    Reaction to betadine eye drops  . Cymbalta [Duloxetine Hcl] Other (See Comments)    Dizziness   . Adhesive [Tape] Rash  . Lipitor [Atorvastatin] Rash     Social History:  reports that she quit smoking about 17 years ago. She has never used smokeless tobacco. She reports that she drinks alcohol. She reports that she does not use drugs.   Family History: Family History  Problem Relation Age of Onset  . Ovarian cancer Mother   . Cancer - Prostate Father   . Breast cancer Paternal Aunt   . Multiple myeloma Paternal Aunt   . Anesthesia problems Neg Hx   . Hypotension Neg Hx   . Malignant hyperthermia Neg Hx   . Pseudochol deficiency Neg Hx     Physical Exam: Vitals:   06/25/16 1913 06/25/16 1915 06/25/16 2000 06/26/16 0045  BP:   (!) 159/71 119/72  Pulse: 85 84 87 85  Resp: '17 14 20 20  ' Temp:  97.7 F (36.5 C) 98.1 F (36.7 C) 97.8 F (36.6 C)  TempSrc:   Oral Oral  SpO2: 100% 100% 100% 98%  Weight:   86.8 kg (191 lb 5.8 oz)   Height:   '5\' 6"'  (1.676 m)     Constitutional:  Alert and awake, oriented x3, not in any acute distress. Eyes: PERLA, EOMI, irises appear normal, anicteric sclera,  ENMT: external ears and nose appear normal, Lips appear normal, oropharynx mucosa, tongue, posterior pharynx appear normal  Neck: neck appears normal, no masses, normal ROM, no thyromegaly, no JVD  CVS: S1-S2 clear, no murmur rubs or gallops, no LE edema, normal pedal pulses  Respiratory:  clear to auscultation bilaterally, no wheezing, rales or rhonchi.  Respiratory effort normal. No accessory muscle use.  Abdomen: soft nontender, nondistended, normal bowel sounds, no hepatosplenomegaly, no hernias  Musculoskeletal: : no cyanosis, clubbing or edema noted bilaterally, SCDs in place Neuro: Cranial nerves II-XII intact, strength, sensation, reflexes Psych: judgement and insight appear normal, stable mood and affect, mental status Skin: no rashes or lesions or ulcers, no induration or nodules    Data reviewed:  I have personally reviewed following labs and imaging studies Labs:  CBC:  Recent Labs Lab 06/25/16 1122  WBC 10.6*  HGB 11.7*  HCT 35.7*  MCV 83.8  PLT 024    Basic Metabolic Panel:  Recent Labs Lab 06/25/16 1122  NA 141  K 3.2*  CL 102  CO2 30  GLUCOSE 111*  BUN 22*  CREATININE 1.17*  CALCIUM 9.3   GFR Estimated Creatinine Clearance: 45.4 mL/min (by C-G formula based on SCr of 1.17 mg/dL (H)). Liver Function Tests: No results for input(s): AST, ALT, ALKPHOS, BILITOT, PROT, ALBUMIN in the last 168 hours. No results for input(s): LIPASE, AMYLASE in the last 168 hours. No results for input(s): AMMONIA in the last 168 hours. Coagulation profile No results for input(s): INR, PROTIME in the last 168 hours.  Cardiac Enzymes: No results for input(s): CKTOTAL, CKMB, CKMBINDEX, TROPONINI in the last 168 hours. BNP: Invalid input(s): POCBNP CBG:  Recent Labs Lab 06/25/16 1321 06/25/16 1419 06/25/16 1705 06/25/16 1753 06/25/16 2109  GLUCAP 83 69 83 94 211*   D-Dimer No results for input(s): DDIMER in the last 72 hours. Hgb A1c No results for input(s): HGBA1C in the last 72 hours. Lipid Profile No results for input(s): CHOL, HDL, LDLCALC, TRIG, CHOLHDL, LDLDIRECT in the last 72 hours. Thyroid function studies No results for input(s): TSH, T4TOTAL, T3FREE, THYROIDAB in the last 72 hours.  Invalid input(s): FREET3 Anemia work up No results for input(s): VITAMINB12, FOLATE, FERRITIN, TIBC, IRON, RETICCTPCT  in the last 72 hours. Urinalysis    Component Value Date/Time   COLORURINE YELLOW 05/30/2014 1623   APPEARANCEUR CLEAR 05/30/2014 1623   LABSPEC 1.015 05/30/2014 1623   PHURINE 6.0 05/30/2014 1623   GLUCOSEU 250 (A) 05/30/2014 1623   HGBUR NEGATIVE 05/30/2014 1623   BILIRUBINUR NEGATIVE 05/30/2014 1623   KETONESUR NEGATIVE 05/30/2014 1623   UROBILINOGEN 0.2 05/30/2014 1623   NITRITE NEGATIVE 05/30/2014 1623   LEUKOCYTESUR NEGATIVE 05/30/2014 1623     Microbiology Recent Results (from the past 240 hour(s))  Surgical pcr screen     Status: None   Collection Time: 06/25/16 11:22 AM  Result Value Ref Range Status   MRSA, PCR NEGATIVE NEGATIVE Final   Staphylococcus aureus NEGATIVE NEGATIVE  Final    Comment:        The Xpert SA Assay (FDA approved for NASAL specimens in patients over 40 years of age), is one component of a comprehensive surveillance program.  Test performance has been validated by [facility] for patients greater than or equal to 100 year old. It is not intended to diagnose infection nor to guide or monitor treatment.        Inpatient Medications:   Scheduled Meds: . baclofen  10 mg Oral QHS  .  ceFAZolin (ANCEF) IV  2 g Intravenous Q8H  . docusate sodium  100 mg Oral BID  . ezetimibe  10 mg Oral Daily  . gabapentin  300 mg Oral TID  . glimepiride  2 mg Oral Q breakfast  . irbesartan  300 mg Oral Daily   Or  . hydrochlorothiazide  12.5 mg Oral Daily  . insulin aspart  0-15 Units Subcutaneous TID WC  . levothyroxine  25 mcg Oral QAC breakfast  . loratadine  10 mg Oral Daily  . mometasone-formoterol  2 puff Inhalation BID  . potassium chloride  40 mEq Oral Once  . senna  1 tablet Oral BID  . sodium chloride flush  3 mL Intravenous Q12H   Continuous Infusions: . sodium chloride    . sodium chloride 125 mL/hr at 06/25/16 2022     Radiological Exams on Admission: Dg Lumbar Spine 2-3 Views  Result Date: 06/25/2016 CLINICAL DATA:  L2-3 fusion  EXAM: LUMBAR SPINE - 2-3 VIEW; DG C-ARM 61-120 MIN COMPARISON:  02/28/2014 FINDINGS: AP and lateral C-arm images were obtained of the lumbar spine in the operating room. Pre-existing pedicle screw and interbody fusion at L3-4 L4-5. Interbody spacer L5-S1 not well seen due to patient positioning. Fusion has been extended to the L2 vertebral body with bilateral pedicle screws in good position. Interbody spacer L2-3 in good position. IMPRESSION: Lumbar fusion L2 through L5 Electronically Signed   By: Franchot Gallo M.D.   On: 06/25/2016 17:33   Dg C-arm 1-60 Min  Result Date: 06/25/2016 CLINICAL DATA:  L2-3 fusion EXAM: LUMBAR SPINE - 2-3 VIEW; DG C-ARM 61-120 MIN COMPARISON:  02/28/2014 FINDINGS: AP and lateral C-arm images were obtained of the lumbar spine in the operating room. Pre-existing pedicle screw and interbody fusion at L3-4 L4-5. Interbody spacer L5-S1 not well seen due to patient positioning. Fusion has been extended to the L2 vertebral body with bilateral pedicle screws in good position. Interbody spacer L2-3 in good position. IMPRESSION: Lumbar fusion L2 through L5 Electronically Signed   By: Franchot Gallo M.D.   On: 06/25/2016 17:33    Impression/Recommendations Principal Problem:   Herniated nucleus pulposus, L2-3 Active Problems:   Diabetes mellitus with neuropathy (HCC)   Hyperlipidemia   Hypokalemia   Essential hypertension, benign   Hypothyroidism  Herniated disc -s/p repair by Dr. Ellene Route -This problem will be managed by him -PT evaluation to determine need for placement and then SW consult as needed  DM -Last A1c was 8.1 in 2/18, indicating suboptimal control -Will continue Levemir and cover with SSI -Creatinine 1.17, GFR 51, likely CKD stage 2-3  Hypokalemia -K 3.2 -Replete and recheck in AM  HLD -Lipids 05/24/16 - Total 227, HDL 45, LDL 155, TG 137 -She is on Zetia but is not taking a statin -She developed a rash with Lipitor in the past -Would suggest trial of  Pravastatin either as inpatient or outpatient, as she is not at goal  Hypothyroidism -Normal  TSH and free T4 in 11/17 -Would not recheck at this time -Continue Synthroid home dose for now  HTN -Continue Diovan-HCT -She likely needs the Diovan for renal protection -She could change to an alternate agent if the HCTZ is inadequate for BP control or if she is prone to dehydration issues -For now, this does not appear to be a concern (or than mild associated hypokalemia)    Thank you for this consultation.  Our [facility] hospitalist team will follow the patient with you.   Time Spent: 46 minutes  Karmen Bongo M.D. [facility] 06/26/2016, 1:09 AM

## 2016-06-25 NOTE — Anesthesia Preprocedure Evaluation (Addendum)
Anesthesia Evaluation  Patient identified by MRN, date of birth, ID band Patient awake    Reviewed: Allergy & Precautions, NPO status , Patient's Chart, lab work & pertinent test results  History of Anesthesia Complications Negative for: history of anesthetic complications  Airway Mallampati: II  TM Distance: >3 FB Neck ROM: Full    Dental  (+) Teeth Intact, Dental Advisory Given   Pulmonary asthma , former smoker,    Pulmonary exam normal        Cardiovascular hypertension, Normal cardiovascular exam  Study Conclusions  - Left ventricle: The cavity size was normal. Wall thickness was   normal. Systolic function was normal. The estimated ejection   fraction was in the range of 60% to 65%. Wall motion was normal;   there were no regional wall motion abnormalities. Doppler   parameters are consistent with abnormal left ventricular   relaxation (grade 1 diastolic dysfunction).   Neuro/Psych Anxiety    GI/Hepatic Neg liver ROS, GERD  ,  Endo/Other  diabetes  Renal/GU negative Renal ROS     Musculoskeletal   Abdominal   Peds  Hematology   Anesthesia Other Findings   Reproductive/Obstetrics                           Anesthesia Physical Anesthesia Plan  ASA: III  Anesthesia Plan: General   Post-op Pain Management:    Induction: Intravenous  Airway Management Planned: Oral ETT  Additional Equipment:   Intra-op Plan:   Post-operative Plan: Extubation in OR  Informed Consent: I have reviewed the patients History and Physical, chart, labs and discussed the procedure including the risks, benefits and alternatives for the proposed anesthesia with the patient or authorized representative who has indicated his/her understanding and acceptance.   Dental advisory given  Plan Discussed with: CRNA, Anesthesiologist and Surgeon  Anesthesia Plan Comments:        Anesthesia Quick  Evaluation

## 2016-06-26 ENCOUNTER — Encounter (HOSPITAL_COMMUNITY): Payer: Self-pay | Admitting: Internal Medicine

## 2016-06-26 DIAGNOSIS — E1140 Type 2 diabetes mellitus with diabetic neuropathy, unspecified: Secondary | ICD-10-CM

## 2016-06-26 DIAGNOSIS — M5126 Other intervertebral disc displacement, lumbar region: Secondary | ICD-10-CM

## 2016-06-26 DIAGNOSIS — G8918 Other acute postprocedural pain: Secondary | ICD-10-CM

## 2016-06-26 DIAGNOSIS — M797 Fibromyalgia: Secondary | ICD-10-CM

## 2016-06-26 DIAGNOSIS — R7309 Other abnormal glucose: Secondary | ICD-10-CM

## 2016-06-26 DIAGNOSIS — Z9889 Other specified postprocedural states: Secondary | ICD-10-CM

## 2016-06-26 DIAGNOSIS — E039 Hypothyroidism, unspecified: Secondary | ICD-10-CM | POA: Diagnosis present

## 2016-06-26 DIAGNOSIS — D62 Acute posthemorrhagic anemia: Secondary | ICD-10-CM

## 2016-06-26 DIAGNOSIS — N179 Acute kidney failure, unspecified: Secondary | ICD-10-CM

## 2016-06-26 DIAGNOSIS — Z419 Encounter for procedure for purposes other than remedying health state, unspecified: Secondary | ICD-10-CM

## 2016-06-26 DIAGNOSIS — E876 Hypokalemia: Secondary | ICD-10-CM

## 2016-06-26 DIAGNOSIS — E785 Hyperlipidemia, unspecified: Secondary | ICD-10-CM

## 2016-06-26 DIAGNOSIS — Z794 Long term (current) use of insulin: Secondary | ICD-10-CM

## 2016-06-26 DIAGNOSIS — D573 Sickle-cell trait: Secondary | ICD-10-CM

## 2016-06-26 DIAGNOSIS — I10 Essential (primary) hypertension: Secondary | ICD-10-CM

## 2016-06-26 LAB — CBC
HCT: 29.5 % — ABNORMAL LOW (ref 36.0–46.0)
HEMOGLOBIN: 9.6 g/dL — AB (ref 12.0–15.0)
MCH: 27.2 pg (ref 26.0–34.0)
MCHC: 32.5 g/dL (ref 30.0–36.0)
MCV: 83.6 fL (ref 78.0–100.0)
PLATELETS: 214 10*3/uL (ref 150–400)
RBC: 3.53 MIL/uL — AB (ref 3.87–5.11)
RDW: 14.9 % (ref 11.5–15.5)
WBC: 9.6 10*3/uL (ref 4.0–10.5)

## 2016-06-26 LAB — GLUCOSE, CAPILLARY
GLUCOSE-CAPILLARY: 131 mg/dL — AB (ref 65–99)
GLUCOSE-CAPILLARY: 108 mg/dL — AB (ref 65–99)
Glucose-Capillary: 297 mg/dL — ABNORMAL HIGH (ref 65–99)
Glucose-Capillary: 138 mg/dL — ABNORMAL HIGH (ref 65–99)

## 2016-06-26 LAB — BASIC METABOLIC PANEL
Anion gap: 9 (ref 5–15)
BUN: 19 mg/dL (ref 6–20)
CHLORIDE: 103 mmol/L (ref 101–111)
CO2: 27 mmol/L (ref 22–32)
Calcium: 8.6 mg/dL — ABNORMAL LOW (ref 8.9–10.3)
Creatinine, Ser: 1.1 mg/dL — ABNORMAL HIGH (ref 0.44–1.00)
GFR calc Af Amer: 55 mL/min — ABNORMAL LOW (ref >60)
GFR calc non Af Amer: 48 mL/min — ABNORMAL LOW (ref >60)
GLUCOSE: 252 mg/dL — AB (ref 65–99)
POTASSIUM: 3.8 mmol/L (ref 3.5–5.1)
Sodium: 139 mmol/L (ref 135–145)

## 2016-06-26 MED ORDER — INSULIN ASPART 100 UNIT/ML ~~LOC~~ SOLN: 0.0000 [IU] | Freq: Every day | SUBCUTANEOUS | Status: DC

## 2016-06-26 MED ORDER — POTASSIUM CHLORIDE CRYS ER 20 MEQ PO TBCR
40.0000 meq | EXTENDED_RELEASE_TABLET | Freq: Once | ORAL | Status: AC
  Administered 2016-06-26: 40 meq via ORAL
  Filled 2016-06-26: qty 2

## 2016-06-26 MED ORDER — INSULIN ASPART 100 UNIT/ML ~~LOC~~ SOLN
6.0000 [IU] | Freq: Three times a day (TID) | SUBCUTANEOUS | Status: DC
  Administered 2016-06-26 – 2016-06-27 (×4): 6 [IU] via SUBCUTANEOUS

## 2016-06-26 MED ORDER — INSULIN DETEMIR 100 UNIT/ML ~~LOC~~ SOLN
12.0000 [IU] | Freq: Every day | SUBCUTANEOUS | Status: DC
  Administered 2016-06-26: 12 [IU] via SUBCUTANEOUS
  Filled 2016-06-26 (×2): qty 0.12

## 2016-06-26 MED ORDER — IRBESARTAN 300 MG PO TABS
300.0000 mg | ORAL_TABLET | Freq: Every day | ORAL | Status: DC
  Administered 2016-06-26 – 2016-06-27 (×2): 300 mg via ORAL
  Filled 2016-06-26: qty 1

## 2016-06-26 MED ORDER — HYDROCHLOROTHIAZIDE 12.5 MG PO CAPS
12.5000 mg | ORAL_CAPSULE | Freq: Every day | ORAL | Status: DC
  Administered 2016-06-26 – 2016-06-27 (×2): 12.5 mg via ORAL
  Filled 2016-06-26: qty 1

## 2016-06-26 MED ORDER — INSULIN ASPART 100 UNIT/ML ~~LOC~~ SOLN
0.0000 [IU] | Freq: Three times a day (TID) | SUBCUTANEOUS | Status: DC
  Administered 2016-06-26: 1 [IU] via SUBCUTANEOUS
  Administered 2016-06-27 (×2): 2 [IU] via SUBCUTANEOUS

## 2016-06-26 MED ORDER — INSULIN ASPART 100 UNIT/ML ~~LOC~~ SOLN
0.0000 [IU] | Freq: Three times a day (TID) | SUBCUTANEOUS | Status: DC
  Administered 2016-06-26: 8 [IU] via SUBCUTANEOUS

## 2016-06-26 MED FILL — Sodium Chloride IV Soln 0.9%: INTRAVENOUS | Qty: 1000 | Status: AC

## 2016-06-26 MED FILL — Heparin Sodium (Porcine) Inj 1000 Unit/ML: INTRAMUSCULAR | Qty: 30 | Status: AC

## 2016-06-26 NOTE — Progress Notes (Signed)
Inpatient Diabetes Program Recommendations  AACE/ADA: New Consensus Statement on Inpatient Glycemic Control (2015)  Target Ranges:  Prepandial:   less than 140 mg/dL      Peak postprandial:   less than 180 mg/dL (1-2 hours)      Critically ill patients:  140 - 180 mg/dL   Lab Results  Component Value Date   GLUCAP 131 (H) 06/26/2016   HGBA1C 8.1 (H) 05/24/2016    Review of Glycemic Control Results for [name], [name] (MRN [id number]) as of 06/26/2016 13:41  Ref. Range 06/25/2016 17:53 06/25/2016 21:09 06/26/2016 06:18 06/26/2016 11:36  Glucose-Capillary Latest Ref Range: 65 - 99 mg/dL 94 211 (H) 297 (H) 131 (H)   Diabetes history: DM2 Outpatient Diabetes medications: Levemir 14 hs + Novolog meal coverage 6 units ac breakfast + 10 units lunch + 9 units dinner + Novolog correction scale 150-200=2 units, 201-250=4 units + 251-300 = 6 units + Amaryl 2 mg qd Current orders for Inpatient glycemic control: Levemir 12 units q hs + Novolog 6 units tid + Amaryl 2 mg qd + Novolog correction 0-9 units tid + 0-5 hs  Inpatient Diabetes Program Recommendations:  Noted call to Dr. Dwyane Dee from patient regarding insulin regimen on 06/11/16 with increase in Levemir to 20 units until am CBGs below 120 and then reduce back to 14 units daily and added the Novolog correction scale @ this time. Please consider: -Increase Levemir to 14 units daily Reevaluate insulin needs in am. Will follow.  Thank you, Nani Gasser. Maron Stanzione, RN, MSN, CDE Inpatient Glycemic Control Team Team Pager [phone] (8am-5pm) 06/26/2016 1:50 PM  -

## 2016-06-26 NOTE — Progress Notes (Signed)
[facility] consult progress note                                                                              Patient Demographics  [name], is a 76 y.o. female, DOB - 27-Jan-1941, PYK:[id number]  Admit date - 06/25/2016   Admitting Physician Kristeen Miss, MD  Outpatient Primary MD for the patient is Wenda Low, MD  Outpatient specialists:   LOS - 1  days    No chief complaint on file.      Brief summary   [name] is an 76 y.o. female with h/o "borderline sleep apnea", unable to tolerate CPAP; HTN; DM; HLD; hypothyroidism; and fibromyalgia presenting with for admission for elevative surgical decompression and stabilization of disc herniation at L2-3 with retrolisthesis and tight stenosis.  She had bilateral laminotomies and discectomy L2-3 today and is already feeling much better.  Still hurting but feels better. Has DM - glucose usually 120 in AM, 180 in the evening - when not on "all these medications."  Sugars in 300s prior to admission due to prednisone. Has macular degeneration in the right eye, doing well. On Synthroid, unsure when last TSH was. Zetia for HLD. Diovan-HCT for HTN.   Assessment & Plan    Principal Problem: Herniated disc -s/p repair by Dr. Ellene Route -Management per neurosurgery, primary team  Active problems DM -Last A1c was 8.1 in 2/18, indicating suboptimal control -Per patient she is on Levemir 12-14 units qhs, NovoLog with meal 6 units with breakfast, 10 units with lunch, 9 units with dinner - Restart Levemir 12 units, add NovoLog 6 units TID AC and sliding scale insulin - Diabetic coordinator consult - Readjust insulin regimen tomorrow   CKD stage II-III -Creatinine 1.1, GFR 51, likely CKD stage 2-3  HLD -Lipids 05/24/16 - Total 227, HDL 45, LDL 155, TG 137 -She is on Zetia but is not taking a statin, continue -She developed a rash with Lipitor in the past   Hypothyroidism -Normal TSH and free T4 in  11/17 -Continue Synthroid home dose for now  HTN -Continue Diovan-HCT - BP stable  Code Status: full  DVT Prophylaxis:  Per neurosurgery  Family Communication: Discussed in detail with the patient, all imaging results, lab results explained to the patient   Disposition Plan:  Per Neurosurgery  Time Spent in minutes  25  minutes  Procedures:    Medications  Scheduled Meds: . baclofen  10 mg Oral QHS  . docusate sodium  100 mg Oral BID  . ezetimibe  10 mg Oral Daily  . gabapentin  300 mg Oral TID  . glimepiride  2 mg Oral Q breakfast  . irbesartan  300 mg Oral Daily   And  . hydrochlorothiazide  12.5 mg Oral Daily  . insulin aspart  0-5 Units Subcutaneous QHS  . insulin aspart  0-9 Units Subcutaneous TID WC  . insulin aspart  6 Units Subcutaneous TID WC  . insulin detemir  12 Units Subcutaneous QHS  . levothyroxine  25 mcg Oral QAC breakfast  . loratadine  10 mg Oral Daily  . mometasone-formoterol  2 puff Inhalation BID  .  senna  1 tablet Oral BID  . sodium chloride flush  3 mL Intravenous Q12H   Continuous Infusions: . sodium chloride    . sodium chloride 125 mL/hr at 06/26/16 0442   PRN Meds:.acetaminophen **OR** acetaminophen, albuterol, alum & mag hydroxide-simeth, bisacodyl, calcium carbonate, HYDROcodone-acetaminophen, HYDROmorphone (DILAUDID) injection, menthol-cetylpyridinium **OR** phenol, ondansetron **OR** ondansetron (ZOFRAN) IV, polyethylene glycol, pregabalin, sodium chloride flush, sodium phosphate   Antibiotics   Anti-infectives    Start     Dose/Rate Route Frequency Ordered Stop   06/25/16 2230  ceFAZolin (ANCEF) IVPB 2g/100 mL premix     2 g 200 mL/hr over 30 Minutes Intravenous Every 8 hours 06/25/16 1948 06/26/16 0700   06/25/16 1534  bacitracin 50,000 Units in sodium chloride irrigation 0.9 % 500 mL irrigation  Status:  Discontinued       As needed 06/25/16 1535 06/25/16 1745   06/25/16 1115  ceFAZolin (ANCEF) IVPB 2g/100 mL premix     2  g 200 mL/hr over 30 Minutes Intravenous On call to O.R. 06/25/16 1103 06/25/16 1440   06/25/16 1111  ceFAZolin (ANCEF) 2-4 GM/100ML-% IVPB    Comments:  Precious Haws   : cabinet override      06/25/16 1111 06/25/16 2045   06/25/16 1108  ceFAZolin (ANCEF) 2-4 GM/100ML-% IVPB    Comments:  Precious Haws   : cabinet override      06/25/16 1108 06/25/16 1440        Subjective:   Harshika Goodwyn was seen and examined today. Still has pain in the back, worse on movement otherwise denies any specific complaints. Patient denies dizziness, chest pain, shortness of breath, abdominal pain, N/V/D/C, new weakness, numbess, tingling. No acute events overnight.    Objective:   Vitals:   06/26/16 0045 06/26/16 0434 06/26/16 1038 06/26/16 1122  BP: 119/72 (!) 101/58 122/61   Pulse: 85 91 87   Resp: 20 20 17    Temp: 97.8 F (36.6 C) 99 F (37.2 C) 98.9 F (37.2 C)   TempSrc: Oral Oral Oral   SpO2: 98% 94% 97% 95%  Weight:      Height:        Intake/Output Summary (Last 24 hours) at 06/26/16 1146 Last data filed at 06/26/16 0311  Gross per 24 hour  Intake          3012.08 ml  Output             1100 ml  Net          1912.08 ml     Wt Readings from Last 3 Encounters:  06/25/16 86.8 kg (191 lb 5.8 oz)  06/04/16 80.7 kg (178 lb)  05/18/16 79.4 kg (175 lb)     Exam  General: Alert and oriented x 3, NAD  HEENT:    Neck:   Cardiovascular: S1 S2 auscultated, no rubs, murmurs or gallops. Regular rate and rhythm.  Respiratory: Clear to auscultation bilaterally, no wheezing, rales or rhonchi  Gastrointestinal: Soft, nontender, nondistended, + bowel sounds  Ext: no cyanosis clubbing or edema  Neuro: no new deficits   Skin: No rashes  Psych: Normal affect and demeanor, alert and oriented x3    Data Reviewed:  I have personally reviewed following labs and imaging studies  Micro Results Recent Results (from the past 240 hour(s))  Surgical pcr screen     Status: None    Collection Time: 06/25/16 11:22 AM  Result Value Ref Range Status   MRSA, PCR NEGATIVE NEGATIVE Final  Staphylococcus aureus NEGATIVE NEGATIVE Final    Comment:        The Xpert SA Assay (FDA approved for NASAL specimens in patients over 2 years of age), is one component of a comprehensive surveillance program.  Test performance has been validated by [facility] for patients greater than or equal to 56 year old. It is not intended to diagnose infection nor to guide or monitor treatment.     Radiology Reports Dg Lumbar Spine 2-3 Views  Result Date: 06/25/2016 CLINICAL DATA:  L2-3 fusion EXAM: LUMBAR SPINE - 2-3 VIEW; DG C-ARM 61-120 MIN COMPARISON:  02/28/2014 FINDINGS: AP and lateral C-arm images were obtained of the lumbar spine in the operating room. Pre-existing pedicle screw and interbody fusion at L3-4 L4-5. Interbody spacer L5-S1 not well seen due to patient positioning. Fusion has been extended to the L2 vertebral body with bilateral pedicle screws in good position. Interbody spacer L2-3 in good position. IMPRESSION: Lumbar fusion L2 through L5 Electronically Signed   By: Franchot Gallo M.D.   On: 06/25/2016 17:33   Mr Lumbar Spine Wo Contrast  Result Date: 06/18/2016 CLINICAL DATA:  Low back pain radiating to right hip EXAM: MRI LUMBAR SPINE WITHOUT CONTRAST TECHNIQUE: Multiplanar, multisequence MR imaging of the lumbar spine was performed. No intravenous contrast was administered. The examination was ordered with IV contrast, but the patient was unable to tolerate the full length of the exam. Only precontrast imaging was obtained. COMPARISON:  Lumbar spine radiograph 06/13/2016 Lumbar spine MRI 06/14/2013 FINDINGS: Segmentation:  Normal Alignment:  Grade 1 L2-L3 retrolisthesis. Vertebrae: Posterior spinal fusion at L3-L5 with transpedicular screws and spinal rods. No abnormal disc spacer subsidence. There is a hemangioma within the posterior inferior L1 vertebral body. Conus  medullaris: Extends to the L1-2 disc level and appears normal. Paraspinal and other soft tissues: There are bilateral renal cysts again seen, slightly increased in size from the prior study. Disc levels: T12-L1: Small central disc protrusion without spinal canal or neural foraminal stenosis, unchanged. L1-L2: Normal disc space and facets. No spinal canal or neuroforaminal stenosis. L2-L3: Large central disc extrusion with superior migration, new from the prior study. Moderate bilateral facet hypertrophy. There is resultant severe spinal canal stenosis. Both neural foramina are severely narrowed. L3-L4: Postfusion changes with wide patency of the spinal canal. Evaluation of the neural foramina is partially obscured by susceptibility artifact from the spinal hardware, though there is probably at least mild narrowing bilaterally. L4-L5: Postfusion changes with wide patency of the spinal canal. Neural foramina are obscured by susceptibility from spinal hardware. L5-S1: No spinal canal or neural foraminal stenosis. Visualized sacrum: Normal. IMPRESSION: 1. New, large central disc extrusion with superior migration at L2-L3 resulting in severe spinal canal stenosis. This, in combination with moderate bilateral facet hypertrophy, also causes severe narrowing of both neural foramina. 2. PLIF at L3-L5 with wide patency of the spinal canal. Assessment of the neural foramina at these levels limited by susceptibility artifact from the spinal hardware. There is probably at least mild narrowing of the foramina at L3-L4. Electronically Signed   By: Ulyses Jarred M.D.   On: 06/18/2016 17:36   Dg C-arm 1-60 Min  Result Date: 06/25/2016 CLINICAL DATA:  L2-3 fusion EXAM: LUMBAR SPINE - 2-3 VIEW; DG C-ARM 61-120 MIN COMPARISON:  02/28/2014 FINDINGS: AP and lateral C-arm images were obtained of the lumbar spine in the operating room. Pre-existing pedicle screw and interbody fusion at L3-4 L4-5. Interbody spacer L5-S1 not well seen due  to  patient positioning. Fusion has been extended to the L2 vertebral body with bilateral pedicle screws in good position. Interbody spacer L2-3 in good position. IMPRESSION: Lumbar fusion L2 through L5 Electronically Signed   By: Franchot Gallo M.D.   On: 06/25/2016 17:33    Lab Data:  CBC:  Recent Labs Lab 06/25/16 1122 06/26/16 0808  WBC 10.6* 9.6  HGB 11.7* 9.6*  HCT 35.7* 29.5*  MCV 83.8 83.6  PLT 196 277   Basic Metabolic Panel:  Recent Labs Lab 06/25/16 1122 06/26/16 0808  NA 141 139  K 3.2* 3.8  CL 102 103  CO2 30 27  GLUCOSE 111* 252*  BUN 22* 19  CREATININE 1.17* 1.10*  CALCIUM 9.3 8.6*   GFR: Estimated Creatinine Clearance: 48.3 mL/min (by C-G formula based on SCr of 1.1 mg/dL (H)). Liver Function Tests: No results for input(s): AST, ALT, ALKPHOS, BILITOT, PROT, ALBUMIN in the last 168 hours. No results for input(s): LIPASE, AMYLASE in the last 168 hours. No results for input(s): AMMONIA in the last 168 hours. Coagulation Profile: No results for input(s): INR, PROTIME in the last 168 hours. Cardiac Enzymes: No results for input(s): CKTOTAL, CKMB, CKMBINDEX, TROPONINI in the last 168 hours. BNP (last 3 results) No results for input(s): PROBNP in the last 8760 hours. HbA1C: No results for input(s): HGBA1C in the last 72 hours. CBG:  Recent Labs Lab 06/25/16 1705 06/25/16 1753 06/25/16 2109 06/26/16 0618 06/26/16 1136  GLUCAP 83 94 211* 297* 131*   Lipid Profile: No results for input(s): CHOL, HDL, LDLCALC, TRIG, CHOLHDL, LDLDIRECT in the last 72 hours. Thyroid Function Tests: No results for input(s): TSH, T4TOTAL, FREET4, T3FREE, THYROIDAB in the last 72 hours. Anemia Panel: No results for input(s): VITAMINB12, FOLATE, FERRITIN, TIBC, IRON, RETICCTPCT in the last 72 hours. Urine analysis:    Component Value Date/Time   COLORURINE YELLOW 05/30/2014 1623   APPEARANCEUR CLEAR 05/30/2014 1623   LABSPEC 1.015 05/30/2014 1623   PHURINE 6.0  05/30/2014 1623   GLUCOSEU 250 (A) 05/30/2014 1623   HGBUR NEGATIVE 05/30/2014 1623   BILIRUBINUR NEGATIVE 05/30/2014 1623   KETONESUR NEGATIVE 05/30/2014 1623   UROBILINOGEN 0.2 05/30/2014 1623   NITRITE NEGATIVE 05/30/2014 1623   LEUKOCYTESUR NEGATIVE 05/30/2014 1623     Chantil Bari M.D. [facility] 06/26/2016, 11:46 AM  Pager: [phone] Between 7am to 7pm - call Pager - [phone]  After 7pm go to [email or web address] - password [email or web address]  Call night coverage person covering after 7pm

## 2016-06-26 NOTE — Evaluation (Signed)
Physical Therapy Evaluation Patient Details Name: [name] MRN: [id number] DOB: Feb 24, 1941 Today's Date: 06/26/2016   History of Present Illness  Pt is a 76 y/o female who presents s/p L2-L3 PLIF on 06/25/2016.  Clinical Impression  Pt admitted with above diagnosis. Pt currently with functional limitations due to the deficits listed below (see PT Problem List). At the time of PT eval pt reports plan is to d/c to SNF as she lives alone. Feel this is appropriate as pt is currently requiring min assist for basic transfers. Pt will benefit from skilled PT to increase their independence and safety with mobility to allow discharge to the venue listed below.       Follow Up Recommendations SNF;Supervision/Assistance - 24 hour    Equipment Recommendations  None recommended by PT (TBD by next venue of care)    Recommendations for Other Services       Precautions / Restrictions Precautions Precautions: Fall Restrictions Weight Bearing Restrictions: No      Mobility  Bed Mobility Overal bed mobility: Needs Assistance Bed Mobility: Rolling;Sidelying to Sit;Sit to Sidelying Rolling: Supervision Sidelying to sit: Min assist     Sit to sidelying: Min assist General bed mobility comments: Assist for transition to/from EOB. Pt moving slow and required heavy use of bed rails.   Transfers Overall transfer level: Needs assistance Equipment used: Rolling walker (2 wheeled) Transfers: Sit to/from Stand Sit to Stand: Min guard         General transfer comment: Hands-on guarding for safety as pt powered up to full standing position.   Ambulation/Gait Ambulation/Gait assistance: Min guard Ambulation Distance (Feet): 70 Feet Assistive device: Rolling walker (2 wheeled) Gait Pattern/deviations: Step-through pattern;Decreased stride length;Trunk flexed Gait velocity: Decreased Gait velocity interpretation: Below normal speed for age/gender General Gait Details: Pt moving very slow and  guarded. Gross min guard assist provided for safety.   Stairs            Wheelchair Mobility    Modified Rankin (Stroke Patients Only)       Balance Overall balance assessment: Needs assistance Sitting-balance support: Feet supported;No upper extremity supported Sitting balance-Leahy Scale: Fair     Standing balance support: No upper extremity supported;During functional activity Standing balance-Leahy Scale: Fair                               Pertinent Vitals/Pain Pain Assessment: Faces Faces Pain Scale: Hurts little more Pain Location: Incision site. Pt reports pain is severe however moving fairly well without facial grimace.  Pain Descriptors / Indicators: Operative site guarding;Discomfort Pain Intervention(s): Limited activity within patient's tolerance;Monitored during session;Repositioned    Home Living Family/patient expects to be discharged to:: Skilled nursing facility Living Arrangements: Alone                    Prior Function Level of Independence: Independent               Hand Dominance   Dominant Hand: Right    Extremity/Trunk Assessment   Upper Extremity Assessment Upper Extremity Assessment: Defer to OT evaluation    Lower Extremity Assessment Lower Extremity Assessment: Generalized weakness (Consistent with pre-op diagnosis)    Cervical / Trunk Assessment Cervical / Trunk Assessment: Other exceptions Cervical / Trunk Exceptions: s/p lumbar surgery. Has a PMH of 3 previous back surgeries.   Communication   Communication: No difficulties  Cognition Arousal/Alertness: Awake/alert Behavior During Therapy: WFL for  tasks assessed/performed Overall Cognitive Status: Within Functional Limits for tasks assessed                      General Comments      Exercises     Assessment/Plan    PT Assessment Patient needs continued PT services  PT Problem List Decreased strength;Decreased range of  motion;Decreased activity tolerance;Decreased balance;Decreased mobility;Decreased knowledge of use of DME;Decreased safety awareness;Decreased knowledge of precautions;Pain       PT Treatment Interventions DME instruction;Gait training;Stair training;Functional mobility training;Therapeutic activities;Therapeutic exercise;Neuromuscular re-education;Patient/family education    PT Goals (Current goals can be found in the Care Plan section)  Acute Rehab PT Goals Patient Stated Goal: To SNF and then home PT Goal Formulation: With patient Time For Goal Achievement: 07/03/16 Potential to Achieve Goals: Good    Frequency Min 5X/week   Barriers to discharge        Co-evaluation               End of Session Equipment Utilized During Treatment: Back brace Activity Tolerance: Patient limited by fatigue;Patient limited by pain Patient left: in bed;with call bell/phone within reach;with bed alarm set Nurse Communication: Mobility status PT Visit Diagnosis: Unsteadiness on feet (R26.81);Other abnormalities of gait and mobility (R26.89);Pain Pain - part of body:  (Back)         Time: 4825-0037 PT Time Calculation (min) (ACUTE ONLY): 33 min   Charges:   PT Evaluation $PT Eval Moderate Complexity: 1 Procedure PT Treatments $Gait Training: 8-22 mins   PT G Codes:         Thelma Comp 06/26/2016, 1:55 PM   Rolinda Roan, PT, DPT Acute Rehabilitation Services Pager: [phone]

## 2016-06-26 NOTE — Progress Notes (Signed)
Patient ID: [name], female   DOB: [birth year], 76 y.o.   MRN: [id number] Vital signs are stable Motor function appears to be doing quite well Incision is clean and dry Mobilizing well Appreciate help of hospitalist service in managing diabetes Will ask rehabilitation medicine to see

## 2016-06-26 NOTE — Progress Notes (Signed)
OT Cancellation Note  Patient Details Name: [name] MRN: [id number] DOB: [birth year]   Cancelled Treatment:    Reason Eval/Treat Not Completed: Patient not medically ready.   Pt currently with order for back brace and no brace present. RN aware of therapy holding pending brace arrival. Pt waiting for family to bring brace from home.  OT will provided evaluation at the next most appropriate time.  Norman Herrlich, MS OTR/L  Pager: [phone]    Norman Herrlich 06/26/2016, 8:35 AM

## 2016-06-26 NOTE — Evaluation (Addendum)
Occupational Therapy Evaluation Patient Details Name: [name] MRN: [id number] DOB: 12/14/1940 Today's Date: 06/26/2016    History of Present Illness Pt is a 76 y/o female who presents s/p L2-L3 PLIF on 06/25/2016. PMH significant for: anemia, anxiety, arthritis, asthma, diabetes mellitus, complication of anesthesia, dyslipidemia, fibromyalgia, GERD, hypertension, sickle cell trait, sleep apnea.   Clinical Impression   PTA, pt was independent with ADL and functional mobility. She currently requires overall min guard assist and increased time for ADL tasks. She would benefit from continued OT services while admitted to improve independence with ADL and functional mobility. She was initially demonstrating frustration with therapist but willing to participate after increased education concerning benefits of mobility, role of OT, and benefits of participation with ADL. Pt lives alone and will not have 24 hour assistance from family. Recommend SNF placement for continued rehabilitation in order to maximize independence with ADL and functional mobility.    Follow Up Recommendations  SNF;Supervision/Assistance - 24 hour    Equipment Recommendations  None recommended by OT (Has needs met)    Recommendations for Other Services       Precautions / Restrictions Precautions Precautions: Fall;Back Precaution Booklet Issued: No Precaution Comments: Verbally reviewed precautions and pt able to recall 3/3. Restrictions Weight Bearing Restrictions: No      Mobility Bed Mobility               General bed mobility comments: OOB in bathroom with CNA on arrival.  Transfers Overall transfer level: Needs assistance Equipment used: Rolling walker (2 wheeled) Transfers: Sit to/from Stand Sit to Stand: Min guard         General transfer comment: Min guard for safety. Pt highly determined to be independent.    Balance Overall balance assessment: Needs assistance Sitting-balance  support: Feet supported;No upper extremity supported Sitting balance-Leahy Scale: Fair     Standing balance support: No upper extremity supported;During functional activity Standing balance-Leahy Scale: Fair                              ADL Overall ADL's : Needs assistance/impaired Eating/Feeding: Set up;Sitting   Grooming: Oral care;Wash/dry face;Brushing hair;Min guard;Standing   Upper Body Bathing: Set up;Sitting   Lower Body Bathing: Min guard;Sit to/from stand   Upper Body Dressing : Set up;Sitting   Lower Body Dressing: Min guard;Sit to/from stand   Toilet Transfer: Ambulation;Min guard;RW   Toileting- Clothing Manipulation and Hygiene: Min guard;Sit to/from stand       Functional mobility during ADLs: Min guard;Rolling walker General ADL Comments: Educated pt on back precautions during ADL. Pt becoming emotional and overwhelmed during session but calmed with encouragement and reassurance from therapist and CNA. She did become agitated with therapist initially during session but was able to restore rapport with pt and end session on good terms.     Vision Patient Visual Report: No change from baseline Vision Assessment?: No apparent visual deficits     Perception     Praxis      Pertinent Vitals/Pain Pain Assessment: 0-10 Pain Score: 8  Pain Location: Incision site. Pt reports pain is severe however moving fairly well without facial grimace.  Pain Descriptors / Indicators: Operative site guarding;Discomfort Pain Intervention(s): Limited activity within patient's tolerance;Monitored during session;Repositioned     Hand Dominance Right   Extremity/Trunk Assessment Upper Extremity Assessment Upper Extremity Assessment: Overall WFL for tasks assessed   Lower Extremity Assessment Lower Extremity Assessment: Generalized weakness     Cervical / Trunk Assessment Cervical / Trunk Assessment: Other exceptions Cervical / Trunk Exceptions: s/p lumbar  surgery. Has a PMH of 3 previous back surgeries.    Communication Communication Communication: No difficulties   Cognition Arousal/Alertness: Awake/alert Behavior During Therapy: WFL for tasks assessed/performed Overall Cognitive Status: Within Functional Limits for tasks assessed                     General Comments       Exercises       Shoulder Instructions      Home Living Family/patient expects to be discharged to:: Skilled nursing facility Living Arrangements: Alone                                      Prior Functioning/Environment Level of Independence: Independent                 OT Problem List: Decreased activity tolerance;Decreased strength;Impaired balance (sitting and/or standing);Decreased safety awareness;Decreased knowledge of use of DME or AE;Pain      OT Treatment/Interventions: Self-care/ADL training;Therapeutic exercise;Energy conservation;DME and/or AE instruction;Therapeutic activities;Patient/family education;Balance training    OT Goals(Current goals can be found in the care plan section) Acute Rehab OT Goals Patient Stated Goal: To SNF and then home OT Goal Formulation: With patient Time For Goal Achievement: 07/10/16 Potential to Achieve Goals: Good ADL Goals Pt Will Perform Lower Body Bathing: with modified independence;with adaptive equipment;sit to/from stand Pt Will Perform Lower Body Dressing: with modified independence;with adaptive equipment;sit to/from stand Pt Will Transfer to Toilet: with modified independence;ambulating;bedside commode Pt Will Perform Toileting - Clothing Manipulation and hygiene: with modified independence;sit to/from stand Additional ADL Goal #1: Pt will complete bed mobility with modified independence in preparation for ADL seated at EOB.  OT Frequency: Min 2X/week   Barriers to D/C:            Co-evaluation              End of Session Equipment Utilized During Treatment:  Rolling walker;Back brace  Activity Tolerance: Patient tolerated treatment well Patient left: in chair;with call bell/phone within reach;with chair alarm set  OT Visit Diagnosis: Unsteadiness on feet (R26.81);Pain Pain - part of body:  (Back)                ADL either performed or assessed with clinical judgement  Time: 1410-1459 OT Time Calculation (min): 49 min Charges:  OT General Charges $OT Visit: 1 Procedure OT Evaluation $OT Eval Moderate Complexity: 1 Procedure OT Treatments $Self Care/Home Management : 23-37 mins G-Codes:      A , MS OTR/L  Pager: [phone]    A  06/26/2016, 6:06 PM 

## 2016-06-26 NOTE — Consult Note (Signed)
Physical Medicine and Rehabilitation Consult Reason for Consult: Lumbar radiculopathy/HNP Referring Physician: Dr. Ellene Route   HPI: [name] is a 76 y.o. right handed female with history of diabetes mellitus, fibromyalgia, sickle cell trait, and back surgery 3. Per chart review patient lives alone and was using a cane prior to admission. 2 level home with bedroom first floor and 3 steps to entry. No local family that can assist. Presented 06/25/2016 with back pain radiating to the lower extremities. X-rays and imaging reviewed, showing disc herniation at L2-L3 with retrolisthesis and stenosis.  Radiculopathy at that level. Underwent bilateral laminotomies and discectomy L2-3 with decompression of central canal L2 and L3 nerve roots. Posterior lumbar interbody arthrodesis using peek spacers pedicle screw fixation L3-L5 06/25/2016 per Dr. Ellene Route. Hospital course pain management. Back brace when out of bed. Acute blood loss anemia at 9.6. Hypokalemia 3.2. Mild elevation creatinine 1.17 from baseline 1.03. Physical and occupational therapy evaluations pending. M.D. has requested physical medicine rehabilitation consult.   Review of Systems  Constitutional: Negative for chills and fever.  HENT: Negative for hearing loss and tinnitus.   Eyes: Negative for blurred vision and double vision.  Respiratory: Negative for cough.   Cardiovascular: Negative for chest pain, palpitations and leg swelling.  Gastrointestinal: Positive for constipation. Negative for nausea and vomiting.  Genitourinary: Negative for dysuria, flank pain and hematuria.  Musculoskeletal: Positive for back pain and myalgias.  Skin: Negative for rash.  Neurological: Positive for weakness. Negative for seizures.  Psychiatric/Behavioral:       Anxiety  All other systems reviewed and are negative.  Past Medical History:  Diagnosis Date  . Anemia    has sickle cell trait  . Anxiety   . Arthritis   . Asthma    has  used inhaler in past for asthmatic bronchitis, last time- early 2012  . Complication of anesthesia    wakes up shaking  . Diabetes mellitus   . Dyslipidemia   . Fibromyalgia   . GERD (gastroesophageal reflux disease)    occas. use of  Prilosec  . Heart murmur    sees Dr. Montez Morita, last seen- early 2012  . Hypertension    02/2010- stress test /w PCP  . Hypothyroidism   . Neuromuscular disorder (HCC)    lumbar radiculopathy, lumbago  . Nocturnal leg cramps 09/27/2014  . Pneumonia   . Sickle cell trait ([place])   . Sleep apnea    borderline sleep apnea, states she no longer uses, early 2012- stopped using    Past Surgical History:  Procedure Laterality Date  . ABDOMINAL HYSTERECTOMY    . adb.cyst     ovarian cyst  . BACK SURGERY     2012, 2015 (3 total)  . COLONOSCOPY    . EYE SURGERY     macular degeneration treatment - injections  . OVARIAN CYST SURGERY     Family History  Problem Relation Age of Onset  . Ovarian cancer Mother   . Cancer - Prostate Father   . Breast cancer Paternal Aunt   . Multiple myeloma Paternal Aunt   . Anesthesia problems Neg Hx   . Hypotension Neg Hx   . Malignant hyperthermia Neg Hx   . Pseudochol deficiency Neg Hx    Social History:  reports that she quit smoking about 17 years ago. She has never used smokeless tobacco. She reports that she drinks alcohol. She reports that she does not use drugs. Allergies:  Allergies  Allergen Reactions  .  Betadine [Povidone Iodine] Swelling    Reaction to betadine eye drops  . Cymbalta [Duloxetine Hcl] Other (See Comments)    Dizziness   . Adhesive [Tape] Rash  . Lipitor [Atorvastatin] Rash   Medications Prior to Admission  Medication Sig Dispense Refill  . baclofen (LIORESAL) 10 MG tablet 1 pill upto TID if needed for muscle spasm. (Patient taking differently: Take 10 mg by mouth at bedtime. ) 90 tablet 1  . BD PEN NEEDLE NANO U/F 32G X 4 MM MISC USE AS DIRECTED 100 each 0  . calcium carbonate (TUMS EX)  750 MG chewable tablet Chew 1 tablet by mouth 2 (two) times daily as needed for heartburn (indigestion).    . Calcium-Magnesium-Zinc (CAL-MAG-ZINC PO) Take 1 tablet by mouth daily.    . cetirizine (ZYRTEC) 10 MG tablet Take 10 mg by mouth daily.    . diclofenac sodium (VOLTAREN) 1 % GEL Apply 4 g topically 4 (four) times daily. Voltaren Gel 3 grams to 3 large joints upto TID 3 TUBES with 3 refills (Patient taking differently: Apply 4 g topically 2 (two) times daily as needed (muscle pain). ) 3 Tube 3  . ezetimibe (ZETIA) 10 MG tablet TAKE 1 TABLET(10 MG) BY MOUTH DAILY 30 tablet 0  . gabapentin (NEURONTIN) 300 MG capsule TAKE 1 CAPSULE(300 MG) BY MOUTH THREE TIMES DAILY 90 capsule 0  . glimepiride (AMARYL) 2 MG tablet TAKE 2 TABLETS BY MOUTH EVERY MORNING AND THEN TAKE 2 TABLETS EVERY EVENING 120 tablet 0  . HYDROcodone-acetaminophen (NORCO/VICODIN) 5-325 MG tablet Take 1 tablet by mouth every 6 (six) hours as needed for moderate pain. (Patient taking differently: Take 1-2 tablets by mouth every 6 (six) hours as needed for moderate pain. ) 15 tablet 0  . ibuprofen (ADVIL,MOTRIN) 800 MG tablet Take 800 mg by mouth 3 (three) times daily as needed (pain).     . Insulin Detemir (LEVEMIR FLEXTOUCH) 100 UNIT/ML Pen INJECT 14 UNITS INTO THE SKIN DAILY AT 10PM 15 mL 0  . LEVEMIR FLEXTOUCH 100 UNIT/ML Pen INJECT 8 UNITS INTO THE SKIN DAILY AT 10PM (Patient taking differently: INJECT 15 UNITS INTO THE SKIN DAILY AT 10PM) 15 mL 0  . levothyroxine (SYNTHROID, LEVOTHROID) 25 MCG tablet TAKE 1 TABLET(25 MCG) BY MOUTH DAILY BEFORE BREAKFAST (Patient taking differently: Take 12.70mg once daily in the morning) 90 tablet 0  . LYRICA 50 MG capsule Take 50 mg by mouth at bedtime as needed (pain). Reported on 07/06/2015    . NOVOLOG FLEXPEN 100 UNIT/ML FlexPen INJECT 6 UNITS UNDER THE SKIN BEFORE BREAKFAST, 10 UNITS BEFORE LUNCH, AND 9 UNITS BEFORE SUPPER 15 mL 0  . PROAIR RESPICLICK 1951(90 Base) MCG/ACT AEPB Inhale 2  puffs into the lungs 3 (three) times daily as needed (shortness of breath).     . SYMBICORT 80-4.5 MCG/ACT inhaler Inhale 2 puffs into the lungs 2 (two) times daily as needed (shortness of breath).     . valsartan-hydrochlorothiazide (DIOVAN-HCT) 320-12.5 MG tablet Take 1 tablet by mouth daily.    . vitamin B-12 (CYANOCOBALAMIN) 500 MCG tablet Take 500 mcg by mouth daily.    . vitamin E 400 UNIT capsule Take 400 Units by mouth daily.    .Marland Kitchenaspirin 81 MG EC tablet Take 816mby mouth once daily  3  . azithromycin (ZITHROMAX) 250 MG tablet Take one pill a day (Patient not taking: Reported on 06/24/2016) 4 tablet 0    Home: Home Living Family/patient expects to be discharged to:: Private  residence Living Arrangements: Alone  Functional History:   Functional Status:  Mobility:          ADL:    Cognition: Cognition Orientation Level: Oriented X4    Blood pressure (!) 101/58, pulse 91, temperature 99 F (37.2 C), temperature source Oral, resp. rate 20, height '5\' 6"'  (1.676 m), weight 86.8 kg (191 lb 5.8 oz), SpO2 94 %. Physical Exam  Vitals reviewed. Constitutional: She is oriented to person, place, and time. She appears well-developed and well-nourished.  HENT:  Head: Normocephalic and atraumatic.  Eyes: Conjunctivae and EOM are normal.  Neck: Normal range of motion. Neck supple. No thyromegaly present.  Cardiovascular: Normal rate and regular rhythm.   Respiratory: Effort normal and breath sounds normal. No respiratory distress.  GI: Soft. Bowel sounds are normal. She exhibits no distension.  Musculoskeletal:  No edema or tenderness in extremities  Neurological: She is alert and oriented to person, place, and time.  Motor: B/l UE 5/5 proximal to distal B/l LE: 4+/5 proximal to distal DTRs symmetric Sensation intact to light touch  Skin: Skin is warm and dry.  Back incision clean and dry  Psychiatric: She has a normal mood and affect. Her behavior is normal.    Results for  orders placed or performed during the hospital encounter of 06/25/16 (from the past 24 hour(s))  Type and screen     Status: None   Collection Time: 06/25/16 11:07 AM  Result Value Ref Range   ABO/RH(D) B POS    Antibody Screen NEG    Sample Expiration 06/28/2016   Glucose, capillary     Status: Abnormal   Collection Time: 06/25/16 11:18 AM  Result Value Ref Range   Glucose-Capillary 114 (H) 65 - 99 mg/dL  Basic metabolic panel     Status: Abnormal   Collection Time: 06/25/16 11:22 AM  Result Value Ref Range   Sodium 141 135 - 145 mmol/L   Potassium 3.2 (L) 3.5 - 5.1 mmol/L   Chloride 102 101 - 111 mmol/L   CO2 30 22 - 32 mmol/L   Glucose, Bld 111 (H) 65 - 99 mg/dL   BUN 22 (H) 6 - 20 mg/dL   Creatinine, Ser 1.17 (H) 0.44 - 1.00 mg/dL   Calcium 9.3 8.9 - 10.3 mg/dL   GFR calc non Af Amer 44 (L) >60 mL/min   GFR calc Af Amer 51 (L) >60 mL/min   Anion gap 9 5 - 15  CBC     Status: Abnormal   Collection Time: 06/25/16 11:22 AM  Result Value Ref Range   WBC 10.6 (H) 4.0 - 10.5 K/uL   RBC 4.26 3.87 - 5.11 MIL/uL   Hemoglobin 11.7 (L) 12.0 - 15.0 g/dL   HCT 35.7 (L) 36.0 - 46.0 %   MCV 83.8 78.0 - 100.0 fL   MCH 27.5 26.0 - 34.0 pg   MCHC 32.8 30.0 - 36.0 g/dL   RDW 14.6 11.5 - 15.5 %   Platelets 196 150 - 400 K/uL  Surgical pcr screen     Status: None   Collection Time: 06/25/16 11:22 AM  Result Value Ref Range   MRSA, PCR NEGATIVE NEGATIVE   Staphylococcus aureus NEGATIVE NEGATIVE  Glucose, capillary     Status: None   Collection Time: 06/25/16  1:21 PM  Result Value Ref Range   Glucose-Capillary 83 65 - 99 mg/dL   Comment 1 Notify RN    Comment 2 Document in Chart   Glucose, capillary  Status: None   Collection Time: 06/25/16  2:19 PM  Result Value Ref Range   Glucose-Capillary 69 65 - 99 mg/dL  Glucose, capillary     Status: None   Collection Time: 06/25/16  5:05 PM  Result Value Ref Range   Glucose-Capillary 83 65 - 99 mg/dL  Glucose, capillary     Status:  None   Collection Time: 06/25/16  5:53 PM  Result Value Ref Range   Glucose-Capillary 94 65 - 99 mg/dL   Comment 1 Notify RN   Glucose, capillary     Status: Abnormal   Collection Time: 06/25/16  9:09 PM  Result Value Ref Range   Glucose-Capillary 211 (H) 65 - 99 mg/dL   Comment 1 Notify RN    Comment 2 Document in Chart   Glucose, capillary     Status: Abnormal   Collection Time: 06/26/16  6:18 AM  Result Value Ref Range   Glucose-Capillary 297 (H) 65 - 99 mg/dL   Comment 1 Notify RN    Comment 2 Document in Chart   CBC     Status: Abnormal   Collection Time: 06/26/16  8:08 AM  Result Value Ref Range   WBC 9.6 4.0 - 10.5 K/uL   RBC 3.53 (L) 3.87 - 5.11 MIL/uL   Hemoglobin 9.6 (L) 12.0 - 15.0 g/dL   HCT 29.5 (L) 36.0 - 46.0 %   MCV 83.6 78.0 - 100.0 fL   MCH 27.2 26.0 - 34.0 pg   MCHC 32.5 30.0 - 36.0 g/dL   RDW 14.9 11.5 - 15.5 %   Platelets 214 150 - 400 K/uL  Basic Metabolic Panel     Status: Abnormal   Collection Time: 06/26/16  8:08 AM  Result Value Ref Range   Sodium 139 135 - 145 mmol/L   Potassium 3.8 3.5 - 5.1 mmol/L   Chloride 103 101 - 111 mmol/L   CO2 27 22 - 32 mmol/L   Glucose, Bld 252 (H) 65 - 99 mg/dL   BUN 19 6 - 20 mg/dL   Creatinine, Ser 1.10 (H) 0.44 - 1.00 mg/dL   Calcium 8.6 (L) 8.9 - 10.3 mg/dL   GFR calc non Af Amer 48 (L) >60 mL/min   GFR calc Af Amer 55 (L) >60 mL/min   Anion gap 9 5 - 15   Dg Lumbar Spine 2-3 Views  Result Date: 06/25/2016 CLINICAL DATA:  L2-3 fusion EXAM: LUMBAR SPINE - 2-3 VIEW; DG C-ARM 61-120 MIN COMPARISON:  02/28/2014 FINDINGS: AP and lateral C-arm images were obtained of the lumbar spine in the operating room. Pre-existing pedicle screw and interbody fusion at L3-4 L4-5. Interbody spacer L5-S1 not well seen due to patient positioning. Fusion has been extended to the L2 vertebral body with bilateral pedicle screws in good position. Interbody spacer L2-3 in good position. IMPRESSION: Lumbar fusion L2 through L5  Electronically Signed   By: Franchot Gallo M.D.   On: 06/25/2016 17:33   Dg C-arm 1-60 Min  Result Date: 06/25/2016 CLINICAL DATA:  L2-3 fusion EXAM: LUMBAR SPINE - 2-3 VIEW; DG C-ARM 61-120 MIN COMPARISON:  02/28/2014 FINDINGS: AP and lateral C-arm images were obtained of the lumbar spine in the operating room. Pre-existing pedicle screw and interbody fusion at L3-4 L4-5. Interbody spacer L5-S1 not well seen due to patient positioning. Fusion has been extended to the L2 vertebral body with bilateral pedicle screws in good position. Interbody spacer L2-3 in good position. IMPRESSION: Lumbar fusion L2 through L5 Electronically Signed  By: Franchot Gallo M.D.   On: 06/25/2016 17:33    Assessment/Plan: Diagnosis: Lumbar radiculopathy s/p lamy and discectomy Labs and images independently reviewed.  Records reviewed and summated above.  1. Does the need for close, 24 hr/day medical supervision in concert with the patient's rehab needs make it unreasonable for this patient to be served in a less intensive setting? Yes 2. Co-Morbidities requiring supervision/potential complications: diabetes mellitus, extremely labile at present (Monitor in accordance with exercise and adjust meds as necessary), fibromyalgia (Biofeedback training with therapies to help reduce reliance on opiate pain medications, monitor pain control during therapies, and sedation at rest and titrate to maximum efficacy to ensure participation and gains in therapies), post-op pain (see previous), sickle cell trait (ensure appropriate hydration), and back surgery 3, Acute blood loss anemia (transfuse if necessary to ensure appropriate perfusion for increased activity tolerance), hypokalemia (continue to monitor and replete as necessary), AKI (avoid nephrotoxic meds),  3. Due to bladder management, safety, skin/wound care, disease management, pain management and patient education, does the patient require 24 hr/day rehab nursing? Yes 4. Does  the patient require coordinated care of a physician, rehab nurse, PT (1-2 hrs/day, 5 days/week) and OT (1-2 hrs/day, 5 days/week) to address physical and functional deficits in the context of the above medical diagnosis(es)? Yes Addressing deficits in the following areas: balance, endurance, locomotion, strength, transferring, bathing, dressing, toileting and psychosocial support 5. Can the patient actively participate in an intensive therapy program of at least 3 hrs of therapy per day at least 5 days per week? Yes 6. The potential for patient to make measurable gains while on inpatient rehab is excellent 7. Anticipated functional outcomes upon discharge from inpatient rehab are modified independent  with PT, modified independent with OT, n/a with SLP. 8. Estimated rehab length of stay to reach the above functional goals is: 8-12 days. 9. Does the patient have adequate social supports and living environment to accommodate these discharge functional goals? Yes 10. Anticipated D/C setting: Home 11. Anticipated post D/C treatments: HH therapy and Home excercise program 12. Overall Rehab/Functional Prognosis: excellent and good  RECOMMENDATIONS: This patient's condition is appropriate for continued rehabilitative care in the following setting: CIR Patient has agreed to participate in recommended program. Potentially Note that insurance prior authorization may be required for reimbursement for recommended care.  Comment: Rehab Admissions Coordinator to follow up.  Delice Lesch, MD, Mellody Drown Cathlyn Parsons., PA-C 06/26/2016

## 2016-06-26 NOTE — Care Management Note (Signed)
Case Management Note  Patient Details  Name: [name] MRN: [id number] Date of Birth: [birth year]  Subjective/Objective:     Pt underwent L2-3 PLIF. She if from home alone.                Action/Plan: PT/OT recommending SNF. CSW aware. CM following for further d/c needs.   Expected Discharge Date:                  Expected Discharge Plan:  [place]  In-House Referral:     Discharge planning Services     Post Acute Care Choice:    Choice offered to:     DME Arranged:    DME Agency:     HH Arranged:    [place] Agency:     Status of Service:  In process, will continue to follow  If discussed at Long Length of Stay Meetings, dates discussed:    Additional Comments:  Pollie Friar, RN 06/26/2016, 6:27 PM

## 2016-06-26 NOTE — Progress Notes (Signed)
Orthopedic Tech Progress Note Patient Details:  [name] June 28, 1940 [id number] Patient has brace. Patient ID: ABILENE MCPHEE, female   DOB: May 27, 1940, 76 y.o.   MRN: [id number]   Braulio Bosch 06/26/2016, 3:17 PM

## 2016-06-27 ENCOUNTER — Other Ambulatory Visit: Payer: Medicare Other

## 2016-06-27 ENCOUNTER — Inpatient Hospital Stay (HOSPITAL_COMMUNITY)
Admission: RE | Admit: 2016-06-27 | Discharge: 2016-07-03 | DRG: 552 | Disposition: A | Discharge status: Home Health | Payer: Medicare Other | Source: Intra-hospital | Attending: Physical Medicine & Rehabilitation | Admitting: Physical Medicine & Rehabilitation

## 2016-06-27 DIAGNOSIS — D573 Sickle-cell trait: Secondary | ICD-10-CM | POA: Diagnosis present

## 2016-06-27 DIAGNOSIS — Z79899 Other long term (current) drug therapy: Secondary | ICD-10-CM

## 2016-06-27 DIAGNOSIS — M79609 Pain in unspecified limb: Secondary | ICD-10-CM | POA: Diagnosis not present

## 2016-06-27 DIAGNOSIS — K219 Gastro-esophageal reflux disease without esophagitis: Secondary | ICD-10-CM | POA: Diagnosis present

## 2016-06-27 DIAGNOSIS — Z7982 Long term (current) use of aspirin: Secondary | ICD-10-CM

## 2016-06-27 DIAGNOSIS — G4700 Insomnia, unspecified: Secondary | ICD-10-CM | POA: Diagnosis present

## 2016-06-27 DIAGNOSIS — E1122 Type 2 diabetes mellitus with diabetic chronic kidney disease: Secondary | ICD-10-CM | POA: Diagnosis present

## 2016-06-27 DIAGNOSIS — E1165 Type 2 diabetes mellitus with hyperglycemia: Secondary | ICD-10-CM | POA: Diagnosis not present

## 2016-06-27 DIAGNOSIS — K5900 Constipation, unspecified: Secondary | ICD-10-CM | POA: Diagnosis present

## 2016-06-27 DIAGNOSIS — Z888 Allergy status to other drugs, medicaments and biological substances status: Secondary | ICD-10-CM | POA: Diagnosis not present

## 2016-06-27 DIAGNOSIS — Z87891 Personal history of nicotine dependence: Secondary | ICD-10-CM | POA: Diagnosis not present

## 2016-06-27 DIAGNOSIS — M5416 Radiculopathy, lumbar region: Secondary | ICD-10-CM

## 2016-06-27 DIAGNOSIS — M5116 Intervertebral disc disorders with radiculopathy, lumbar region: Secondary | ICD-10-CM | POA: Diagnosis present

## 2016-06-27 DIAGNOSIS — E876 Hypokalemia: Secondary | ICD-10-CM | POA: Diagnosis not present

## 2016-06-27 DIAGNOSIS — F419 Anxiety disorder, unspecified: Secondary | ICD-10-CM | POA: Diagnosis present

## 2016-06-27 DIAGNOSIS — Z90710 Acquired absence of both cervix and uterus: Secondary | ICD-10-CM | POA: Diagnosis not present

## 2016-06-27 DIAGNOSIS — R011 Cardiac murmur, unspecified: Secondary | ICD-10-CM | POA: Diagnosis present

## 2016-06-27 DIAGNOSIS — Z794 Long term (current) use of insulin: Secondary | ICD-10-CM

## 2016-06-27 DIAGNOSIS — M48062 Spinal stenosis, lumbar region with neurogenic claudication: Secondary | ICD-10-CM

## 2016-06-27 DIAGNOSIS — K5903 Drug induced constipation: Secondary | ICD-10-CM | POA: Diagnosis not present

## 2016-06-27 DIAGNOSIS — Z7951 Long term (current) use of inhaled steroids: Secondary | ICD-10-CM

## 2016-06-27 DIAGNOSIS — M797 Fibromyalgia: Secondary | ICD-10-CM | POA: Diagnosis present

## 2016-06-27 DIAGNOSIS — Z91048 Other nonmedicinal substance allergy status: Secondary | ICD-10-CM | POA: Diagnosis not present

## 2016-06-27 DIAGNOSIS — E039 Hypothyroidism, unspecified: Secondary | ICD-10-CM | POA: Diagnosis present

## 2016-06-27 DIAGNOSIS — N183 Chronic kidney disease, stage 3 (moderate): Secondary | ICD-10-CM | POA: Diagnosis present

## 2016-06-27 DIAGNOSIS — Z981 Arthrodesis status: Secondary | ICD-10-CM | POA: Diagnosis not present

## 2016-06-27 DIAGNOSIS — I129 Hypertensive chronic kidney disease with stage 1 through stage 4 chronic kidney disease, or unspecified chronic kidney disease: Secondary | ICD-10-CM | POA: Diagnosis present

## 2016-06-27 DIAGNOSIS — E785 Hyperlipidemia, unspecified: Secondary | ICD-10-CM | POA: Diagnosis present

## 2016-06-27 DIAGNOSIS — E1142 Type 2 diabetes mellitus with diabetic polyneuropathy: Secondary | ICD-10-CM | POA: Diagnosis not present

## 2016-06-27 HISTORY — DX: Radiculopathy, lumbar region: M54.16

## 2016-06-27 LAB — GLUCOSE, CAPILLARY
GLUCOSE-CAPILLARY: 114 mg/dL — AB (ref 65–99)
GLUCOSE-CAPILLARY: 116 mg/dL — AB (ref 65–99)
GLUCOSE-CAPILLARY: 152 mg/dL — AB (ref 65–99)
GLUCOSE-CAPILLARY: 153 mg/dL — AB (ref 65–99)

## 2016-06-27 LAB — HEMOGLOBIN A1C
Hgb A1c MFr Bld: 7.8 % — ABNORMAL HIGH (ref 4.8–5.6)
MEAN PLASMA GLUCOSE: 177 mg/dL

## 2016-06-27 MED ORDER — INSULIN DETEMIR 100 UNIT/ML ~~LOC~~ SOLN
12.0000 [IU] | Freq: Every day | SUBCUTANEOUS | Status: DC
  Administered 2016-06-27 – 2016-07-02 (×6): 12 [IU] via SUBCUTANEOUS
  Filled 2016-06-27 (×6): qty 0.12

## 2016-06-27 MED ORDER — DOCUSATE SODIUM 100 MG PO CAPS
100.0000 mg | ORAL_CAPSULE | Freq: Two times a day (BID) | ORAL | Status: DC
  Administered 2016-06-27 – 2016-07-01 (×8): 100 mg via ORAL
  Filled 2016-06-27 (×8): qty 1

## 2016-06-27 MED ORDER — MOMETASONE FURO-FORMOTEROL FUM 100-5 MCG/ACT IN AERO
2.0000 | INHALATION_SPRAY | Freq: Two times a day (BID) | RESPIRATORY_TRACT | Status: DC
  Administered 2016-06-27 – 2016-07-03 (×9): 2 via RESPIRATORY_TRACT
  Filled 2016-06-27 (×2): qty 8.8

## 2016-06-27 MED ORDER — POLYETHYLENE GLYCOL 3350 17 G PO PACK
17.0000 g | PACK | Freq: Every day | ORAL | Status: DC | PRN
  Administered 2016-06-28: 17 g via ORAL
  Filled 2016-06-27: qty 1

## 2016-06-27 MED ORDER — INSULIN ASPART 100 UNIT/ML ~~LOC~~ SOLN
6.0000 [IU] | Freq: Three times a day (TID) | SUBCUTANEOUS | Status: DC
  Administered 2016-06-28 – 2016-07-03 (×15): 6 [IU] via SUBCUTANEOUS

## 2016-06-27 MED ORDER — ACETAMINOPHEN 650 MG RE SUPP: 650.0000 mg | RECTAL | Status: DC | PRN

## 2016-06-27 MED ORDER — ONDANSETRON HCL 4 MG/2ML IJ SOLN: 4.0000 mg | Freq: Four times a day (QID) | INTRAMUSCULAR | Status: DC | PRN

## 2016-06-27 MED ORDER — BISACODYL 10 MG RE SUPP
10.0000 mg | Freq: Every day | RECTAL | Status: DC | PRN
  Administered 2016-06-28 – 2016-07-01 (×3): 10 mg via RECTAL
  Filled 2016-06-27 (×3): qty 1

## 2016-06-27 MED ORDER — EZETIMIBE 10 MG PO TABS
10.0000 mg | ORAL_TABLET | Freq: Every day | ORAL | Status: DC
  Administered 2016-06-28 – 2016-07-03 (×6): 10 mg via ORAL
  Filled 2016-06-27 (×7): qty 1

## 2016-06-27 MED ORDER — ONDANSETRON HCL 4 MG PO TABS: 4.0000 mg | ORAL_TABLET | Freq: Four times a day (QID) | ORAL | Status: DC | PRN

## 2016-06-27 MED ORDER — LORATADINE 10 MG PO TABS
10.0000 mg | ORAL_TABLET | Freq: Every day | ORAL | Status: DC
  Administered 2016-06-28 – 2016-07-03 (×6): 10 mg via ORAL
  Filled 2016-06-27 (×6): qty 1

## 2016-06-27 MED ORDER — INSULIN ASPART 100 UNIT/ML ~~LOC~~ SOLN: 0.0000 [IU] | Freq: Every day | SUBCUTANEOUS | Status: DC

## 2016-06-27 MED ORDER — ALBUTEROL SULFATE (2.5 MG/3ML) 0.083% IN NEBU: 3.0000 mL | INHALATION_SOLUTION | Freq: Three times a day (TID) | RESPIRATORY_TRACT | Status: DC | PRN

## 2016-06-27 MED ORDER — SORBITOL 70 % SOLN
30.0000 mL | Freq: Every day | Status: DC | PRN
  Administered 2016-06-28: 30 mL via ORAL
  Filled 2016-06-27 (×2): qty 30

## 2016-06-27 MED ORDER — HYDROCODONE-ACETAMINOPHEN 5-325 MG PO TABS
1.0000 | ORAL_TABLET | ORAL | Status: DC | PRN
  Administered 2016-06-27 – 2016-07-03 (×20): 2 via ORAL
  Filled 2016-06-27 (×22): qty 2

## 2016-06-27 MED ORDER — BACLOFEN 10 MG PO TABS
10.0000 mg | ORAL_TABLET | Freq: Every day | ORAL | Status: DC
  Administered 2016-06-27 – 2016-07-02 (×6): 10 mg via ORAL
  Filled 2016-06-27 (×6): qty 1

## 2016-06-27 MED ORDER — HYDROCHLOROTHIAZIDE 12.5 MG PO CAPS
12.5000 mg | ORAL_CAPSULE | Freq: Every day | ORAL | Status: DC
  Administered 2016-06-28 – 2016-07-03 (×6): 12.5 mg via ORAL
  Filled 2016-06-27 (×7): qty 1

## 2016-06-27 MED ORDER — PREGABALIN 50 MG PO CAPS
50.0000 mg | ORAL_CAPSULE | Freq: Every evening | ORAL | Status: DC | PRN
  Administered 2016-06-28 – 2016-07-02 (×4): 50 mg via ORAL
  Filled 2016-06-27 (×4): qty 1

## 2016-06-27 MED ORDER — GLIMEPIRIDE 2 MG PO TABS
2.0000 mg | ORAL_TABLET | Freq: Every day | ORAL | Status: DC
  Administered 2016-06-28 – 2016-07-03 (×6): 2 mg via ORAL
  Filled 2016-06-27 (×6): qty 1

## 2016-06-27 MED ORDER — LEVOTHYROXINE SODIUM 25 MCG PO TABS
25.0000 ug | ORAL_TABLET | Freq: Every day | ORAL | Status: DC
  Administered 2016-06-28 – 2016-07-03 (×6): 25 ug via ORAL
  Filled 2016-06-27 (×6): qty 1

## 2016-06-27 MED ORDER — CALCIUM CARBONATE ANTACID 500 MG PO CHEW: 1.0000 | CHEWABLE_TABLET | Freq: Two times a day (BID) | ORAL | Status: DC | PRN

## 2016-06-27 MED ORDER — ACETAMINOPHEN 325 MG PO TABS: 650.0000 mg | ORAL_TABLET | ORAL | Status: DC | PRN

## 2016-06-27 MED ORDER — GABAPENTIN 300 MG PO CAPS
300.0000 mg | ORAL_CAPSULE | Freq: Three times a day (TID) | ORAL | Status: DC
  Administered 2016-06-27 – 2016-07-03 (×17): 300 mg via ORAL
  Filled 2016-06-27 (×17): qty 1

## 2016-06-27 MED ORDER — IRBESARTAN 300 MG PO TABS
300.0000 mg | ORAL_TABLET | Freq: Every day | ORAL | Status: DC
  Administered 2016-06-28 – 2016-07-03 (×6): 300 mg via ORAL
  Filled 2016-06-27 (×6): qty 1

## 2016-06-27 NOTE — Progress Notes (Signed)
Inpatient Diabetes Program Recommendations  AACE/ADA: New Consensus Statement on Inpatient Glycemic Control (2015)  Target Ranges:  Prepandial:   less than 140 mg/dL      Peak postprandial:   less than 180 mg/dL (1-2 hours)      Critically ill patients:  140 - 180 mg/dL   Results for MALERIE, EAKINS (MRN [id number]) as of 06/27/2016 10:14  Ref. Range 06/25/2016 13:21 06/25/2016 14:19 06/25/2016 17:05 06/25/2016 17:53 06/25/2016 21:09 06/26/2016 06:18 06/26/2016 11:36 06/26/2016 18:07 06/26/2016 20:54 06/27/2016 04:13 06/27/2016 06:19  Glucose-Capillary Latest Ref Range: 65 - 99 mg/dL 83 69 83 94 211 (H) 297 (H) 131 (H) 108 (H) 138 (H) 114 (H) 116 (H)   Review of Glycemic Control  Diabetes history: DM2 Outpatient Diabetes medications:  Levemir 14 units QHS, Novolog meal coverage 6 units ac breakfast, 10 units with lunch,  9 units with supper, Novolog correction scale 150-200=2 units, 201-250=4 units + 251-300 = 6 units , Amaryl 2 mg daily Current orders for Inpatient glycemic control: Levemir 12 units QHS, Novolog 0-9 units TID with meals, Novolog 0-5 units QHS, Novolog 6 units TID with meals for meal coverage, Amaryl 2 mg QAM  Inpatient Diabetes Program Recommendations: Do NOT recommend any changes with insulin regimen at this time.  NOTE: In reviewing the chart, noted patient received a one time dose of Decadron 8 mg on 06/26/16 at 14:50. Glucose has ranged from 108-138 mg/dl over the past 12 hours. Will continue to follow.  Thanks, Barnie Alderman, RN, MSN, CDE Diabetes Coordinator Inpatient Diabetes Program [phone] (Team Pager from 8am to 5pm)

## 2016-06-27 NOTE — H&P (Signed)
Physical Medicine and Rehabilitation Admission H&P    Chief complaint: Back pain  HPI: [name] is a 76 y.o. right handed female with history of diabetes mellitus, fibromyalgia, CKD stage III creatinine 1.17, sickle cell trait, and back surgery 3. Per chart review patient lives alone and was using a cane prior to admission. 2 level home with bedroom first floor and 3 steps to entry. No local family that can assist. Presented 06/25/2016 with back pain radiating to the lower extremities. X-rays and imaging reviewed, showing disc herniation at L2-L3 with retrolisthesis and stenosis.  Radiculopathy at that level. Underwent bilateral laminotomies and discectomy L2-3 with decompression of central canal L2 and L3 nerve roots. Posterior lumbar interbody arthrodesis using peek spacers pedicle screw fixation L3-L5 06/25/2016 per Dr. Ellene Route. Hospital course pain management. Back brace when out of bed. Acute blood loss anemia at 9.6. Hypokalemia 3.2. Mild elevation creatinine 1.17 from baseline 1.03. Physical and occupational therapy evaluations pending. M.D. has requested physical medicine rehabilitation consult. Patient was admitted for a comprehensive rehabilitation program  Review of Systems  Constitutional: Negative for chills and fever.  HENT: Negative for hearing loss.   Eyes: Negative for blurred vision and double vision.  Respiratory: Negative for cough and shortness of breath.   Cardiovascular: Positive for leg swelling. Negative for chest pain and palpitations.  Gastrointestinal: Positive for constipation. Negative for nausea and vomiting.       GERD  Genitourinary: Positive for urgency. Negative for dysuria and hematuria.  Musculoskeletal: Positive for back pain and myalgias.  Skin: Negative for rash.  Neurological: Positive for weakness. Negative for seizures.  Psychiatric/Behavioral: The patient has insomnia.        Anxiety  All other systems reviewed and are negative.  Past  Medical History:  Diagnosis Date  . Anemia    has sickle cell trait  . Anxiety   . Arthritis   . Asthma    has used inhaler in past for asthmatic bronchitis, last time- early 2012  . Complication of anesthesia    wakes up shaking  . Diabetes mellitus   . Dyslipidemia   . Fibromyalgia   . GERD (gastroesophageal reflux disease)    occas. use of  Prilosec  . Heart murmur    sees Dr. Montez Morita, last seen- early 2012  . Hypertension    02/2010- stress test /w PCP  . Hypothyroidism   . Neuromuscular disorder (HCC)    lumbar radiculopathy, lumbago  . Nocturnal leg cramps 09/27/2014  . Pneumonia   . Sickle cell trait ([place])   . Sleep apnea    borderline sleep apnea, states she no longer uses, early 2012- stopped using    Past Surgical History:  Procedure Laterality Date  . ABDOMINAL HYSTERECTOMY    . adb.cyst     ovarian cyst  . BACK SURGERY     2012, 2015 (3 total)  . COLONOSCOPY    . EYE SURGERY     macular degeneration treatment - injections  . OVARIAN CYST SURGERY     Family History  Problem Relation Age of Onset  . Ovarian cancer Mother   . Cancer - Prostate Father   . Breast cancer Paternal Aunt   . Multiple myeloma Paternal Aunt   . Anesthesia problems Neg Hx   . Hypotension Neg Hx   . Malignant hyperthermia Neg Hx   . Pseudochol deficiency Neg Hx    Social History:  reports that she quit smoking about 17 years ago. She  has never used smokeless tobacco. She reports that she drinks alcohol. She reports that she does not use drugs. Allergies:  Allergies  Allergen Reactions  . Betadine [Povidone Iodine] Swelling    Reaction to betadine eye drops  . Cymbalta [Duloxetine Hcl] Other (See Comments)    Dizziness   . Adhesive [Tape] Rash  . Lipitor [Atorvastatin] Rash   Medications Prior to Admission  Medication Sig Dispense Refill  . aspirin 81 MG EC tablet Take 21m by mouth once daily  3  . azithromycin (ZITHROMAX) 250 MG tablet Take one pill a day (Patient not  taking: Reported on 06/24/2016) 4 tablet 0  . baclofen (LIORESAL) 10 MG tablet 1 pill upto TID if needed for muscle spasm. (Patient taking differently: Take 10 mg by mouth at bedtime. ) 90 tablet 1  . BD PEN NEEDLE NANO U/F 32G X 4 MM MISC USE AS DIRECTED 100 each 0  . calcium carbonate (TUMS EX) 750 MG chewable tablet Chew 1 tablet by mouth 2 (two) times daily as needed for heartburn (indigestion).    . Calcium-Magnesium-Zinc (CAL-MAG-ZINC PO) Take 1 tablet by mouth daily.    . cetirizine (ZYRTEC) 10 MG tablet Take 10 mg by mouth daily.    . diclofenac sodium (VOLTAREN) 1 % GEL Apply 4 g topically 4 (four) times daily. Voltaren Gel 3 grams to 3 large joints upto TID 3 TUBES with 3 refills (Patient taking differently: Apply 4 g topically 2 (two) times daily as needed (muscle pain). ) 3 Tube 3  . ezetimibe (ZETIA) 10 MG tablet TAKE 1 TABLET(10 MG) BY MOUTH DAILY 30 tablet 0  . gabapentin (NEURONTIN) 300 MG capsule TAKE 1 CAPSULE(300 MG) BY MOUTH THREE TIMES DAILY 90 capsule 0  . glimepiride (AMARYL) 2 MG tablet TAKE 2 TABLETS BY MOUTH EVERY MORNING AND THEN TAKE 2 TABLETS EVERY EVENING 120 tablet 0  . HYDROcodone-acetaminophen (NORCO/VICODIN) 5-325 MG tablet Take 1 tablet by mouth every 6 (six) hours as needed for moderate pain. (Patient taking differently: Take 1-2 tablets by mouth every 6 (six) hours as needed for moderate pain. ) 15 tablet 0  . ibuprofen (ADVIL,MOTRIN) 800 MG tablet Take 800 mg by mouth 3 (three) times daily as needed (pain).     . Insulin Detemir (LEVEMIR FLEXTOUCH) 100 UNIT/ML Pen INJECT 14 UNITS INTO THE SKIN DAILY AT 10PM 15 mL 0  . LEVEMIR FLEXTOUCH 100 UNIT/ML Pen INJECT 8 UNITS INTO THE SKIN DAILY AT 10PM (Patient taking differently: INJECT 15 UNITS INTO THE SKIN DAILY AT 10PM) 15 mL 0  . levothyroxine (SYNTHROID, LEVOTHROID) 25 MCG tablet TAKE 1 TABLET(25 MCG) BY MOUTH DAILY BEFORE BREAKFAST (Patient taking differently: Take 12.570m once daily in the morning) 90 tablet 0  .  LYRICA 50 MG capsule Take 50 mg by mouth at bedtime as needed (pain). Reported on 07/06/2015    . NOVOLOG FLEXPEN 100 UNIT/ML FlexPen INJECT 6 UNITS UNDER THE SKIN BEFORE BREAKFAST, 10 UNITS BEFORE LUNCH, AND 9 UNITS BEFORE SUPPER 15 mL 0  . PROAIR RESPICLICK 1089390 Base) MCG/ACT AEPB Inhale 2 puffs into the lungs 3 (three) times daily as needed (shortness of breath).     . SYMBICORT 80-4.5 MCG/ACT inhaler Inhale 2 puffs into the lungs 2 (two) times daily as needed (shortness of breath).     . valsartan-hydrochlorothiazide (DIOVAN-HCT) 320-12.5 MG tablet Take 1 tablet by mouth daily.    . vitamin B-12 (CYANOCOBALAMIN) 500 MCG tablet Take 500 mcg by mouth daily.    .Marland Kitchen  vitamin E 400 UNIT capsule Take 400 Units by mouth daily.      Home:     Functional History:    Functional Status:  Mobility:          ADL:    Cognition:      Physical Exam: Blood pressure (!) 111/52, pulse 85, temperature 98.6 F (37 C), temperature source Oral, resp. rate 16, SpO2 97 %. Physical Exam  Vitals reviewed. Constitutional: She appears well-developed.  HENT:  Head: Normocephalic.  Eyes: EOM are normal.  Neck: Normal range of motion. Neck supple. No thyromegaly present.  Cardiovascular: Normal rate and regular rhythm.   Respiratory: Effort normal and breath sounds normal. No respiratory distress.  GI: Soft. Bowel sounds are normal. She exhibits no distension.  Skin. Clean and dry Neurological: She is alert and oriented to person, place, and time.  Motor: LLE3/5 HF, KE 4/5 L ADF, PF, 5/5 RLE 5/5 BUE DTRs symmetric Sensation intact to light touch  BLE   Results for orders placed or performed during the hospital encounter of 06/25/16 (from the past 48 hour(s))  Glucose, capillary     Status: Abnormal   Collection Time: 06/26/16  6:18 AM  Result Value Ref Range   Glucose-Capillary 297 (H) 65 - 99 mg/dL   Comment 1 Notify RN    Comment 2 Document in Chart   CBC     Status: Abnormal    Collection Time: 06/26/16  8:08 AM  Result Value Ref Range   WBC 9.6 4.0 - 10.5 K/uL   RBC 3.53 (L) 3.87 - 5.11 MIL/uL   Hemoglobin 9.6 (L) 12.0 - 15.0 g/dL   HCT 29.5 (L) 36.0 - 46.0 %   MCV 83.6 78.0 - 100.0 fL   MCH 27.2 26.0 - 34.0 pg   MCHC 32.5 30.0 - 36.0 g/dL   RDW 14.9 11.5 - 15.5 %   Platelets 214 150 - 400 K/uL  Basic Metabolic Panel     Status: Abnormal   Collection Time: 06/26/16  8:08 AM  Result Value Ref Range   Sodium 139 135 - 145 mmol/L   Potassium 3.8 3.5 - 5.1 mmol/L   Chloride 103 101 - 111 mmol/L   CO2 27 22 - 32 mmol/L   Glucose, Bld 252 (H) 65 - 99 mg/dL   BUN 19 6 - 20 mg/dL   Creatinine, Ser 1.10 (H) 0.44 - 1.00 mg/dL   Calcium 8.6 (L) 8.9 - 10.3 mg/dL   GFR calc non Af Amer 48 (L) >60 mL/min   GFR calc Af Amer 55 (L) >60 mL/min    Comment: (NOTE) The eGFR has been calculated using the CKD EPI equation. This calculation has not been validated in all clinical situations. eGFR's persistently <60 mL/min signify possible Chronic Kidney Disease.    Anion gap 9 5 - 15  Glucose, capillary     Status: Abnormal   Collection Time: 06/26/16 11:36 AM  Result Value Ref Range   Glucose-Capillary 131 (H) 65 - 99 mg/dL  Hemoglobin A1c     Status: Abnormal   Collection Time: 06/26/16 12:00 PM  Result Value Ref Range   Hgb A1c MFr Bld 7.8 (H) 4.8 - 5.6 %    Comment: (NOTE)         Pre-diabetes: 5.7 - 6.4         Diabetes: >6.4         Glycemic control for adults with diabetes: <7.0    Mean Plasma Glucose 177 mg/dL  Comment: (NOTE) Performed At: [facility] [address] [place], [place] [id number] Lindon Romp MD CB:[id number]   Glucose, capillary     Status: Abnormal   Collection Time: 06/26/16  6:07 PM  Result Value Ref Range   Glucose-Capillary 108 (H) 65 - 99 mg/dL  Glucose, capillary     Status: Abnormal   Collection Time: 06/26/16  8:54 PM  Result Value Ref Range   Glucose-Capillary 138 (H) 65 - 99 mg/dL  Glucose, capillary      Status: Abnormal   Collection Time: 06/27/16  4:13 AM  Result Value Ref Range   Glucose-Capillary 114 (H) 65 - 99 mg/dL   Comment 1 Notify RN    Comment 2 Document in Chart   Glucose, capillary     Status: Abnormal   Collection Time: 06/27/16  6:19 AM  Result Value Ref Range   Glucose-Capillary 116 (H) 65 - 99 mg/dL   Comment 1 Notify RN    Comment 2 Document in Chart   Glucose, capillary     Status: Abnormal   Collection Time: 06/27/16 11:22 AM  Result Value Ref Range   Glucose-Capillary 152 (H) 65 - 99 mg/dL   Comment 1 Notify RN    Comment 2 Document in Chart   Glucose, capillary     Status: Abnormal   Collection Time: 06/27/16  4:37 PM  Result Value Ref Range   Glucose-Capillary 153 (H) 65 - 99 mg/dL   Comment 1 Notify RN    Comment 2 Document in Chart    No results found.     Medical Problem List and Plan: 1.  Decreased functional mobility secondary to Lumbar radiculopathy status post laminectomy discectomy with pedicle screw fixation 06/25/2016. Back brace when out of bed applied the sitting position. May ambulate to bathroom without brace. May remove brace to shower. 2.  DVT Prophylaxis/Anticoagulation: SCDs. Check vascular study 3. Pain Management: Baclofen 10 mg daily at bedtime, Neurontin 300 mg 3 times a day, hydrocodone as needed, Lyrica 50 mg daily at bedtime as needed 4. Mood: Provide emotional support 5. Neuropsych: This patient is capable of making decisions on her own behalf. 6. Skin/Wound Care: Routine skin checks 7. Fluids/Electrolytes/Nutrition: Routine I&O with follow-up chemistries 8. Hypertension. Avapro 300 mg daily, HCTZ 12.5 mg daily. Monitor with increased mobility 9. Diabetes mellitus and peripheral neuropathy. Hemoglobin A1c 7.8. Levemir 12 units daily at bedtime, Amaryl 2 mg daily. Check blood sugars before meals and at bedtime. Diabetic teaching 10. CKD stage III creatinine 1.17 baseline. Follow-up chemistries 11. Asthma. Continue inhalers as  directed 12. Sickle cell trait. 13. Constipation. Laxative assistance 14. Hyperlipidemia. Zetia10 mg daily 15. Hypothyroidism. Synthroid    Post Admission Physician Evaluation: 1. Functional deficits secondary  to Left lumbar radiculopathy due to Lumbar spinal stenosis s/p decompression and fusion. 2. Patient is admitted to receive collaborative, interdisciplinary care between the physiatrist, rehab nursing staff, and therapy team. 3. Patient's level of medical complexity and substantial therapy needs in context of that medical necessity cannot be provided at a lesser intensity of care such as a SNF. 4. Patient has experienced substantial functional loss from his/her baseline which was documented above under the "Functional History" and "Functional Status" headings.  Judging by the patient's diagnosis, physical exam, and functional history, the patient has potential for functional progress which will result in measurable gains while on inpatient rehab.  These gains will be of substantial and practical use upon discharge  in facilitating mobility and self-care at  the household level. 5. Physiatrist will provide 24 hour management of medical needs as well as oversight of the therapy plan/treatment and provide guidance as appropriate regarding the interaction of the two. 6. The Preadmission Screening has been reviewed and patient status is unchanged unless otherwise stated above. 7. 24 hour rehab nursing will assist with bladder management, bowel management, safety, skin/wound care, disease management, medication administration, pain management and patient education  and help integrate therapy concepts, techniques,education, etc. 8. PT will assess and treat for/with: pre gait, gait training, endurance , safety, equipment, neuromuscular re education.   Goals are: Sup/Mod I. 9. OT will assess and treat for/with: ADLs, Cognitive perceptual skills, Neuromuscular re education, safety, endurance, equipment.    Goals are: Sup/Mod I. Therapy may proceed with showering this patient. 10. SLP will assess and treat for/with: NA.  Goals are: NA. 11. Case Management and Social Worker will assess and treat for psychological issues and discharge planning. 12. Team conference will be held weekly to assess progress toward goals and to determine barriers to discharge. 13. Patient will receive at least 3 hours of therapy per day at least 5 days per week. 14. ELOS: 7-10d       15. Prognosis:  excellent     DAN ANGUILLI PA-C Charlett Blake, MD 06/27/2016

## 2016-06-27 NOTE — Progress Notes (Signed)
Inpatient Rehabilitation  Met with patient to discuss team's recommendation for post acute rehab.  Discussed the differences between IP Rehab and SNF level rehab.  Patient eager to regain her independence and agreeable to admitting to IP Rehab today.  Will proceed with IP Rehab admission today.  Please call with questions.   Carmelia Roller., CCC/SLP Admission Coordinator  [facility]  Cell [phone]

## 2016-06-27 NOTE — Progress Notes (Signed)
Patient ID: [name], female   DOB: [birth year], 76 y.o.   MRN: [id number] Vital signs noted normal Patient feels well Some drainage noted Will check in am Either SNF or inpatient rehab per rehab and case management.

## 2016-06-27 NOTE — Consult Note (Signed)
            [facility] Primary Care Navigator  06/27/2016  [name] [name] [id number]    Patient was seen at the bedside to identify possible discharge needs. Patient reports havingincrease pain to bilateral legs limiting mobility that had led to this admission/surgery. Patient endorses [facility] Wenda Low with [facility] as herprimary care provider.   Patient shared using [place] [facility] to obtain medications without any problem.  She reports managing hermedications at home using "pill box" system weekly.  Patient states living alone, independent with self care and able to drive prior to admission/ surgery but sister [name]) canprovide transportation to her doctors' appointmentsafter discharge if needed.  Her sister will be able to provide assistance with care at home as needed per patient.  Discharge plan is CIR [facility]) prior to returning home as stated.  Patient voiced understanding to call primary care provider's office when she gets back home, for a post discharge follow-up appointment within a week or sooner if needed.Patient letter (with PCP's contact number) wasprovided as a reminder.  Discussed with patientregarding [facility] care management services available for her but patient states that she handles DM management at home by herself and follows up with endocrinologist (Dr. Dwyane Dee) about every 3 months. Patient reports having elevated blood sugars and recent A1C due to steroids she was put on. Inpatient DM coordinator had been consulted and seen patient today. [facility] care management contact information provided for future needs that may arise.  For additional questions please contact:  Edwena Felty A. Jaella Weinert, BSN, RN-BC [facility] PRIMARY CARE Navigator Cell: [phone]

## 2016-06-27 NOTE — Progress Notes (Signed)
Physical Therapy Treatment Patient Details Name: [name] MRN: [id number] DOB: 16-Aug-1940 Today's Date: 06/27/2016    History of Present Illness Pt is a 76 y/o female who presents s/p L2-L3 PLIF on 06/25/2016. PMH significant for: anemia, anxiety, arthritis, asthma, diabetes mellitus, complication of anesthesia, dyslipidemia, fibromyalgia, GERD, hypertension, sickle cell trait, sleep apnea.    PT Comments    Pt performed increased mobility, pt remains highly motivated to return to independence.  Strength remains patient's largest limiting factor at this time.  Pt requiring mod assist to lift LEs into bed.  Will continue to recommend rehab in a post acute setting at this time.  Plan in chart for CIR today.      Follow Up Recommendations  SNF;Supervision/Assistance - 24 hour     Equipment Recommendations  None recommended by PT (TBD at next venue of care. )    Recommendations for Other Services       Precautions / Restrictions Precautions Precautions: Fall;Back Precaution Booklet Issued: No Precaution Comments: Verbally reviewed precautions and pt able to recall 3/3 with min VCs.   Restrictions Weight Bearing Restrictions: No    Mobility  Bed Mobility Overal bed mobility: Needs Assistance Bed Mobility: Rolling;Sit to Sidelying Rolling: Supervision       Sit to sidelying: Mod assist General bed mobility comments: Pt required assistance to lift B LEs into bed.    Transfers Overall transfer level: Needs assistance Equipment used: Rolling walker (2 wheeled) Transfers: Sit to/from Stand Sit to Stand: Min guard         General transfer comment: Min guard for safety. Pt highly determined to be independent.  Ambulation/Gait Ambulation/Gait assistance: Min guard Ambulation Distance (Feet): 120 Feet Assistive device: Rolling walker (2 wheeled) Gait Pattern/deviations: Step-through pattern;Decreased stride length;Trunk flexed Gait velocity: Decreased Gait velocity  interpretation: Below normal speed for age/gender General Gait Details: Pt moving very slow and guarded. Gross min guard assist provided for safety.    Stairs            Wheelchair Mobility    Modified Rankin (Stroke Patients Only)       Balance     Sitting balance-Leahy Scale: Fair       Standing balance-Leahy Scale: Fair                      Cognition Arousal/Alertness: Awake/alert Behavior During Therapy: WFL for tasks assessed/performed Overall Cognitive Status: Within Functional Limits for tasks assessed                      Exercises      General Comments        Pertinent Vitals/Pain Pain Assessment: 0-10 Pain Score: 8  Pain Location: Incision site. Pt reports pain is severe however moving fairly well without facial grimace.  Pain Descriptors / Indicators: Operative site guarding;Discomfort Pain Intervention(s): Monitored during session    Home Living                      Prior Function            PT Goals (current goals can now be found in the care plan section) Acute Rehab PT Goals Patient Stated Goal: To CIR today and then home Progress towards PT goals: Progressing toward goals    Frequency    Min 5X/week      PT Plan Current plan remains appropriate    Co-evaluation  End of Session Equipment Utilized During Treatment: Gait belt;Back brace Activity Tolerance: Patient limited by fatigue;Patient limited by pain Patient left: in bed;with call bell/phone within reach;with bed alarm set Nurse Communication: Mobility status PT Visit Diagnosis: Unsteadiness on feet (R26.81);Other abnormalities of gait and mobility (R26.89);Pain Pain - part of body:  (back)     Time: 1441-1456 PT Time Calculation (min) (ACUTE ONLY): 15 min  Charges:  $Gait Training: 8-22 mins                    G Codes:       Cristela Blue 28-Jun-2016, 3:12 PM Governor Rooks, PTA pager [phone]

## 2016-06-27 NOTE — PMR Pre-admission (Signed)
PMR Admission Coordinator Pre-Admission Assessment  Patient: [name] is an 76 y.o., female MRN: [id number] DOB: 09/28/1940 Height: '5\' 6"'  (167.6 cm) Weight: 86.8 kg (191 lb 5.8 oz)              Insurance Information HMO:     PPO:      PCP:      IPA:      80/20:      OTHER:  PRIMARY: Medicare A & B      Policy#: [id number]      Subscriber: Self CM Name:       Phone#:      Fax#:  Pre-Cert#: eligible via [employer] Online Portal       Employer: Retired  Benefits:  Phone #:      Name:  Eff. Date: 11/20/04     Deduct: $1340      Out of Pocket Max: N/A      Life Max: N/A CIR: 100%      SNF: 100% days 1-20; 80% days 21-100 Outpatient: 80%     Co-Pay: 20% Home Health: 100%      Co-Pay: 0 DME: 80%     Co-Pay: 20% Providers: patient's choice   SECONDARY: [facility]      Policy#: [id number]      Subscriber: Self CM Name:       Phone#:      Fax#:  Pre-Cert#:       Employer: Retired  Benefits:  Phone #: [phone]     Name:  Eff. Date:      Deduct:       Out of Pocket Max:       Life Max:  CIR:       SNF:  Outpatient:      Co-Pay:  Home Health:       Co-Pay:  DME:      Co-Pay:   Medicaid Application Date:       Case Manager:  Disability Application Date:       Case Worker:   Emergency Contact Information Contact Information    Name Relation Home Work Mobile   Patricia,[place] Sister [phone]     Lyn Records [phone]       Current Medical History  Patient Admitting Diagnosis: Lumbar radiculopathy s/p lamy and discectomy   History of Present Illness: Julia F. Mosbyis a 76 y.o.right handed femalewith history of diabetes mellitus, fibromyalgia, CKD stage III creatinine 1.17, sickle cell trait, andback surgery 3.Per chart review patient lives alone and was using a cane prior to admission. 2 level home with bedroom first floor and 3 steps for entry. No local family that can assist.Presented 06/25/2016 with back pain radiating to the lower extremities.  X-rays and imaging reviewed, showing disc herniation at L2-L3 with retrolisthesis and stenosis. Radiculopathy at that level. Underwent bilateral laminotomies and discectomy L2-3 with decompression of central canal L2 and L3 nerve roots. Posterior lumbar interbody arthrodesis using peek spacers pedicle screw fixation L3-L5 06/25/2016 per Dr. Ellene Route. Hospital course pain management. Back brace when out of bed.Acute blood loss anemia at 9.6. Hypokalemia 3.2. Mild elevation creatinine 1.17 from baseline 1.03. Physical and occupational therapy evaluations pending. M.D. has requested physical medicine rehabilitation consult. Patient was admitted for a comprehensive rehabilitation program 06/27/16.       Past Medical History  Past Medical History:  Diagnosis Date  . Anemia    has sickle cell trait  . Anxiety   . Arthritis   . Asthma  has used inhaler in past for asthmatic bronchitis, last time- early 2012  . Complication of anesthesia    wakes up shaking  . Diabetes mellitus   . Dyslipidemia   . Fibromyalgia   . GERD (gastroesophageal reflux disease)    occas. use of  Prilosec  . Heart murmur    sees Dr. Montez Morita, last seen- early 2012  . Hypertension    02/2010- stress test /w PCP  . Hypothyroidism   . Neuromuscular disorder (HCC)    lumbar radiculopathy, lumbago  . Nocturnal leg cramps 09/27/2014  . Pneumonia   . Sickle cell trait ([place])   . Sleep apnea    borderline sleep apnea, states she no longer uses, early 2012- stopped using     Family History  family history includes Breast cancer in her paternal aunt; Cancer - Prostate in her father; Multiple myeloma in her paternal aunt; Ovarian cancer in her mother.  Prior Rehab/Hospitalizations:  Has the patient had major surgery during 100 days prior to admission? No  Current Medications   Current Facility-Administered Medications:  .  0.9 %  sodium chloride infusion, 250 mL, Intravenous, Continuous, Kristeen Miss, MD .  0.9 %  sodium  chloride infusion, , Intravenous, Continuous, Kristeen Miss, MD, Stopped at 06/27/16 0406 .  acetaminophen (TYLENOL) tablet 650 mg, 650 mg, Oral, Q4H PRN **OR** acetaminophen (TYLENOL) suppository 650 mg, 650 mg, Rectal, Q4H PRN, Kristeen Miss, MD .  albuterol (PROVENTIL) (2.5 MG/3ML) 0.083% nebulizer solution 3 mL, 3 mL, Inhalation, TID PRN, Kristeen Miss, MD .  alum & mag hydroxide-simeth (MAALOX/MYLANTA) 200-200-20 MG/5ML suspension 30 mL, 30 mL, Oral, Q6H PRN, Kristeen Miss, MD .  baclofen (LIORESAL) tablet 10 mg, 10 mg, Oral, QHS, Kristeen Miss, MD, 10 mg at 06/26/16 2233 .  bisacodyl (DULCOLAX) suppository 10 mg, 10 mg, Rectal, Daily PRN, Kristeen Miss, MD .  calcium carbonate (TUMS - dosed in mg elemental calcium) chewable tablet 200 mg of elemental calcium, 1 tablet, Oral, BID PRN, Kristeen Miss, MD .  docusate sodium (COLACE) capsule 100 mg, 100 mg, Oral, BID, Kristeen Miss, MD, 100 mg at 06/27/16 0958 .  ezetimibe (ZETIA) tablet 10 mg, 10 mg, Oral, Daily, Kristeen Miss, MD, 10 mg at 06/27/16 0957 .  gabapentin (NEURONTIN) capsule 300 mg, 300 mg, Oral, TID, Kristeen Miss, MD, 300 mg at 06/27/16 0958 .  glimepiride (AMARYL) tablet 2 mg, 2 mg, Oral, Q breakfast, Kristeen Miss, MD, 2 mg at 06/27/16 2993 .  irbesartan (AVAPRO) tablet 300 mg, 300 mg, Oral, Daily, 300 mg at 06/27/16 0957 **AND** hydrochlorothiazide (MICROZIDE) capsule 12.5 mg, 12.5 mg, Oral, Daily, Kristeen Miss, MD, 12.5 mg at 06/27/16 0958 .  HYDROcodone-acetaminophen (NORCO/VICODIN) 5-325 MG per tablet 1-2 tablet, 1-2 tablet, Oral, Q4H PRN, Kristeen Miss, MD, 2 tablet at 06/27/16 1213 .  HYDROmorphone (DILAUDID) injection 1 mg, 1 mg, Intravenous, Q2H PRN, Kristeen Miss, MD, 1 mg at 06/26/16 2033 .  insulin aspart (novoLOG) injection 0-5 Units, 0-5 Units, Subcutaneous, QHS, Ripudeep K Rai, MD .  insulin aspart (novoLOG) injection 0-9 Units, 0-9 Units, Subcutaneous, TID WC, Ripudeep K Rai, MD, 2 Units at 06/27/16 1213 .  insulin aspart  (novoLOG) injection 6 Units, 6 Units, Subcutaneous, TID WC, Ripudeep K Rai, MD, 6 Units at 06/27/16 1212 .  insulin detemir (LEVEMIR) injection 12 Units, 12 Units, Subcutaneous, QHS, Ripudeep K Rai, MD, 12 Units at 06/26/16 2237 .  levothyroxine (SYNTHROID, LEVOTHROID) tablet 25 mcg, 25 mcg, Oral, QAC breakfast, Kristeen Miss, MD, 25 mcg at  06/27/16 0728 .  loratadine (CLARITIN) tablet 10 mg, 10 mg, Oral, Daily, Kristeen Miss, MD, 10 mg at 06/27/16 0958 .  menthol-cetylpyridinium (CEPACOL) lozenge 3 mg, 1 lozenge, Oral, PRN **OR** phenol (CHLORASEPTIC) mouth spray 1 spray, 1 spray, Mouth/Throat, PRN, Kristeen Miss, MD .  mometasone-formoterol [facility]) 100-5 MCG/ACT inhaler 2 puff, 2 puff, Inhalation, BID, Kristeen Miss, MD, 2 puff at 06/27/16 [id number] .  ondansetron (ZOFRAN) tablet 4 mg, 4 mg, Oral, Q6H PRN **OR** ondansetron (ZOFRAN) injection 4 mg, 4 mg, Intravenous, Q6H PRN, Kristeen Miss, MD .  polyethylene glycol (MIRALAX / GLYCOLAX) packet 17 g, 17 g, Oral, Daily PRN, Kristeen Miss, MD, 17 g at 06/26/16 2320 .  pregabalin (LYRICA) capsule 50 mg, 50 mg, Oral, QHS PRN, Kristeen Miss, MD, 50 mg at 06/26/16 2234 .  senna (SENOKOT) tablet 8.6 mg, 1 tablet, Oral, BID, Kristeen Miss, MD, 8.6 mg at 06/27/16 0957 .  sodium chloride flush (NS) 0.9 % injection 3 mL, 3 mL, Intravenous, Q12H, Kristeen Miss, MD, 3 mL at 06/26/16 2242 .  sodium chloride flush (NS) 0.9 % injection 3 mL, 3 mL, Intravenous, PRN, Kristeen Miss, MD .  sodium phosphate (FLEET) 7-19 GM/118ML enema 1 enema, 1 enema, Rectal, Once PRN, Kristeen Miss, MD  Patients Current Diet: Diet Carb Modified Fluid consistency: Thin; Room service appropriate? Yes  Precautions / Restrictions Precautions Precautions: Fall, Back Precaution Booklet Issued: No Precaution Comments: Verbally reviewed precautions and pt able to recall 3/3. Restrictions Weight Bearing Restrictions: No   Has the patient had 2 or more falls or a fall with injury in the past  year?No  Prior Activity Level Community (5-7x/wk): Prior to admission patient was an active fully independent woman, who had had a decline over the past month requiring use of a cane and less outings in a week.     Home Assistive Devices / Equipment Home Assistive Devices/Equipment: Eyeglasses, Kasandra Knudsen (specify quad or straight)  Prior Device Use: Indicate devices/aids used by the patient prior to current illness, exacerbation or injury? Walker [employer]  Prior Functional Level Prior Function Level of Independence: Independent  Self Care: Did the patient need help bathing, dressing, using the toilet or eating? Independent  Indoor Mobility: Did the patient need assistance with walking from room to room (with or without device)? Independent  Stairs: Did the patient need assistance with internal or external stairs (with or without device)? Independent  Functional Cognition: Did the patient need help planning regular tasks such as shopping or remembering to take medications? Independent  Current Functional Level Cognition  Overall Cognitive Status: Within Functional Limits for tasks assessed Orientation Level: Oriented X4    Extremity Assessment (includes Sensation/Coordination)  Upper Extremity Assessment: Overall WFL for tasks assessed  Lower Extremity Assessment: Generalized weakness    ADLs  Overall ADL's : Needs assistance/impaired Eating/Feeding: Set up, Sitting Grooming: Oral care, Wash/dry face, Brushing hair, Min guard, Standing Upper Body Bathing: Set up, Sitting Lower Body Bathing: Min guard, Sit to/from stand Upper Body Dressing : Set up, Sitting Lower Body Dressing: Min guard, Sit to/from stand Toilet Transfer: Ambulation, Min guard, RW Toileting- Water quality scientist and Hygiene: Min guard, Sit to/from stand Functional mobility during ADLs: Min guard, Rolling walker General ADL Comments: Educated pt on back precautions during ADL. Pt becoming emotional and  overwhelmed during session but calmed with encouragement and reassurance from therapist and CNA. She did become agitated with therapist initially during session but was able to restore rapport with pt and end session on good  terms.    Mobility  Overal bed mobility: Needs Assistance Bed Mobility: Rolling, Sidelying to Sit, Sit to Sidelying Rolling: Supervision Sidelying to sit: Min assist Sit to sidelying: Min assist General bed mobility comments: OOB in bathroom with CNA on arrival.    Transfers  Overall transfer level: Needs assistance Equipment used: Rolling walker (2 wheeled) Transfers: Sit to/from Stand Sit to Stand: Min guard General transfer comment: Min guard for safety. Pt highly determined to be independent.    Ambulation / Gait / Stairs / Wheelchair Mobility  Ambulation/Gait Ambulation/Gait assistance: Physicist, medical (Feet): 70 Feet Assistive device: Rolling walker (2 wheeled) Gait Pattern/deviations: Step-through pattern, Decreased stride length, Trunk flexed General Gait Details: Pt moving very slow and guarded. Gross min guard assist provided for safety.  Gait velocity: Decreased Gait velocity interpretation: Below normal speed for age/gender    Posture / Balance Balance Overall balance assessment: Needs assistance Sitting-balance support: Feet supported, No upper extremity supported Sitting balance-Leahy Scale: Fair Standing balance support: No upper extremity supported, During functional activity Standing balance-Leahy Scale: Fair    Special needs/care consideration BiPAP/CPAP: No CPM: No Continuous Drip IV: No Dialysis: No        Life Vest: No Oxygen: No Special Bed: No Trach Size: No Wound Vac (area): No       Skin: Dry all over; Surgical incision back                               Bowel mgmt: 06/24/16 Bladder mgmt: Continent, some urgency  Diabetic mgmt: HbA1c 8.1, sub-optimal control; reports she checked blood sugars and took oral  medication and insulin prior to admission.     Previous Home Environment Living Arrangements: Alone Home Care Services: No  Discharge Living Setting Plans for Discharge Living Setting: Patient's home, Alone Type of Home at Discharge: House Discharge Home Layout: Two level, Able to live on main level with bedroom/bathroom Alternate Level Stairs-Rails: Can reach both Alternate Level Stairs-Number of Steps: 15 Discharge Home Access: Stairs to enter Entrance Stairs-Rails: Right Entrance Stairs-Number of Steps: 3 Discharge Bathroom Shower/Tub: Walk-in shower (with bench ) Discharge Bathroom Toilet: Standard (with adaptive equippment to elevate) Discharge Bathroom Accessibility: No Does the patient have any problems obtaining your medications?: Yes (Describe) (pt reports they are so expensive that she cannot afford them)  Social/Family/Support Systems Patient Roles: Psychologist, occupational, Parent, Other (Comment) (grandparent, friend ) Discharge Plan Discussed with Primary Caregiver:  (discussed with patient ) Is Caregiver In Agreement with Plan?:  (patient in agreement with plan ) Does Caregiver/Family have Issues with Lodging/Transportation while Pt is in Rehab?: No  Goals/Additional Needs Patient/Family Goal for Rehab: PT/OT Mod I  Expected length of stay: 8-12 days  Cultural Considerations: Retired [place] Needs: Carb. Mod. Equipment Needs: TBD Special Service Needs: None Additional Information: None Pt/Family Agrees to Admission and willing to participate: Yes Program Orientation Provided & Reviewed with Pt/Caregiver Including Roles  & Responsibilities: Yes Additional Information Needs: Pt. reports that she does better when she is encouraged versus being ordered to do things; she wants to be fully independent again  Information Needs to be Provided By: Team    Decrease burden of Care through IP rehab admission: No  Possible need for SNF placement  upon discharge: Not anticipated   Patient Condition: This patient's condition remains as documented in the consult dated 06/26/16, in which the Rehabilitation Physician determined and documented that the  patient's condition is appropriate for intensive rehabilitative care in an inpatient rehabilitation facility and is in agreement. Will admit to inpatient rehab today.  Preadmission Screen Completed By:  Gunnar Fusi, 06/27/2016 1:49 PM ______________________________________________________________________   Discussed status with Dr. Letta Pate on 06/27/16 at 1400 and received telephone approval for admission today.  Admission Coordinator:  Gunnar Fusi, time 1400/Date 06/27/16

## 2016-06-27 NOTE — Progress Notes (Signed)
Pt being discharged to inpatient rehab per orders from MD. Pt made aware of transfer. Pt verbalized understanding of situation. Pt being transferred via wheelchair. All questions and concerns were addressed. Pt going to 4W18.

## 2016-06-27 NOTE — Clinical Social Work Note (Addendum)
CSW consulted for SNF placement. CIR accepting and admitting pt today. CSW confirmed with Horton Finer and informed RNCM. CSW signing off as no further needs identified. Reconsult if new needs arise.   Oretha Ellis, [place], [place] Work [phone]

## 2016-06-27 NOTE — Progress Notes (Signed)
[facility] consult progress note                                                                              Patient Demographics  [name], is a 76 y.o. female, DOB - Dec 10, 1940, ATF:[id number]  Admit date - 06/25/2016   Admitting Physician Kristeen Miss, MD  Outpatient Primary MD for the patient is Wenda Low, MD  Outpatient specialists:   LOS - 2  days    No chief complaint on file.      Brief summary   [name] is an 76 y.o. female with h/o "borderline sleep apnea", unable to tolerate CPAP; HTN; DM; HLD; hypothyroidism; and fibromyalgia presenting with for admission for elevative surgical decompression and stabilization of disc herniation at L2-3 with retrolisthesis and tight stenosis.  She had bilateral laminotomies and discectomy L2-3 today and is already feeling much better.  Still hurting but feels better. Has DM - glucose usually 120 in AM, 180 in the evening - when not on "all these medications."  Sugars in 300s prior to admission due to prednisone. Has macular degeneration in the right eye, doing well. On Synthroid, unsure when last TSH was. Zetia for HLD. Diovan-HCT for HTN.   Assessment & Plan    Principal Problem: Herniated disc -s/p repair by Dr. Ellene Route -Management per neurosurgery, primary team  Active problems DM - Better control since yesterday's adjustment after insulin regimen -Last A1c was 8.1 in 2/18, indicating suboptimal control -Per patient she is on Levemir 12-14 units qhs, NovoLog with meal 6 units with breakfast, 10 units with lunch, 9 units with dinner - Restarted Levemir 12 units, added NovoLog 6 units TID AC and sliding scale insulin - Diabetic coordinator consulted - Continue current insulin regimen and adjust as needed   CKD stage II-III -Creatinine 1.1, GFR 51, likely CKD stage 2-3  HLD -Lipids 05/24/16 - Total 227, HDL 45, LDL 155, TG 137 -She is on Zetia but is not taking a statin, continue -She  developed a rash with Lipitor in the past   Hypothyroidism -Normal TSH and free T4 in 11/17 -Continue Synthroid home dose for now  HTN -Continue Diovan-HCT - BP stable  Code Status: full  DVT Prophylaxis:  Per neurosurgery  Family Communication: Discussed in detail with the patient, all imaging results, lab results explained to the patient   Disposition Plan:  Per Neurosurgery. Currently patient is being discharged to inpatient rehabilitation. I will sign off.  Time Spent in minutes  15  minutes  Procedures:    Medications  Scheduled Meds: . baclofen  10 mg Oral QHS  . docusate sodium  100 mg Oral BID  . ezetimibe  10 mg Oral Daily  . gabapentin  300 mg Oral TID  . glimepiride  2 mg Oral Q breakfast  . irbesartan  300 mg Oral Daily   And  . hydrochlorothiazide  12.5 mg Oral Daily  . insulin aspart  0-5 Units Subcutaneous QHS  . insulin aspart  0-9 Units Subcutaneous TID WC  . insulin aspart  6 Units Subcutaneous TID WC  . insulin detemir  12 Units Subcutaneous QHS  .  levothyroxine  25 mcg Oral QAC breakfast  . loratadine  10 mg Oral Daily  . mometasone-formoterol  2 puff Inhalation BID  . senna  1 tablet Oral BID  . sodium chloride flush  3 mL Intravenous Q12H   Continuous Infusions: . sodium chloride    . sodium chloride Stopped (06/27/16 0406)   PRN Meds:.acetaminophen **OR** acetaminophen, albuterol, alum & mag hydroxide-simeth, bisacodyl, calcium carbonate, HYDROcodone-acetaminophen, HYDROmorphone (DILAUDID) injection, menthol-cetylpyridinium **OR** phenol, ondansetron **OR** ondansetron (ZOFRAN) IV, polyethylene glycol, pregabalin, sodium chloride flush, sodium phosphate   Antibiotics   Anti-infectives    Start     Dose/Rate Route Frequency Ordered Stop   06/25/16 2230  ceFAZolin (ANCEF) IVPB 2g/100 mL premix     2 g 200 mL/hr over 30 Minutes Intravenous Every 8 hours 06/25/16 1948 06/26/16 0700   06/25/16 1534  bacitracin 50,000 Units in sodium  chloride irrigation 0.9 % 500 mL irrigation  Status:  Discontinued       As needed 06/25/16 1535 06/25/16 1745   06/25/16 1115  ceFAZolin (ANCEF) IVPB 2g/100 mL premix     2 g 200 mL/hr over 30 Minutes Intravenous On call to O.R. 06/25/16 1103 06/25/16 1440   06/25/16 1111  ceFAZolin (ANCEF) 2-4 GM/100ML-% IVPB    Comments:  Precious Haws   : cabinet override      06/25/16 1111 06/25/16 2045   06/25/16 1108  ceFAZolin (ANCEF) 2-4 GM/100ML-% IVPB    Comments:  Precious Haws   : cabinet override      06/25/16 1108 06/25/16 1440        Subjective:   [name] was seen and examined today. No complaints today except some pain in the back. Looking forward to rehabilitation.  Patient denies dizziness, chest pain, shortness of breath, abdominal pain, N/V/D/C, new weakness, numbess, tingling. No acute events overnight.    Objective:   Vitals:   06/27/16 0500 06/27/16 0718 06/27/16 0941 06/27/16 1337  BP: (!) 118/46  100/60 (!) 107/52  Pulse: 85  82 87  Resp: 18  20 16   Temp: 100.3 F (37.9 C)  98.1 F (36.7 C) 98.1 F (36.7 C)  TempSrc: Oral  Oral Oral  SpO2: 92% 95% 98% 98%  Weight:      Height:        Intake/Output Summary (Last 24 hours) at 06/27/16 1401 Last data filed at 06/27/16 1337  Gross per 24 hour  Intake              603 ml  Output                0 ml  Net              603 ml     Wt Readings from Last 3 Encounters:  06/25/16 86.8 kg (191 lb 5.8 oz)  06/04/16 80.7 kg (178 lb)  05/18/16 79.4 kg (175 lb)     Exam  General: Alert and oriented x 3, NAD  HEENT:    Neck:   Cardiovascular: S1 S2 clear, RRR  Respiratory: Clear to auscultation bilaterally, no wheezing, rales or rhonchi  Gastrointestinal: Soft, nontender, nondistended, + bowel sounds  Ext: no cyanosis clubbing or edema  Neuro: no new deficits   Skin: No rashes  Psych: Normal affect and demeanor, alert and oriented x3    Data Reviewed:  I have personally reviewed following labs  and imaging studies  Micro Results Recent Results (from the past 240 hour(s))  Surgical pcr screen  Status: None   Collection Time: 06/25/16 11:22 AM  Result Value Ref Range Status   MRSA, PCR NEGATIVE NEGATIVE Final   Staphylococcus aureus NEGATIVE NEGATIVE Final    Comment:        The Xpert SA Assay (FDA approved for NASAL specimens in patients over 39 years of age), is one component of a comprehensive surveillance program.  Test performance has been validated by [facility] for patients greater than or equal to 91 year old. It is not intended to diagnose infection nor to guide or monitor treatment.     Radiology Reports Dg Lumbar Spine 2-3 Views  Result Date: 06/25/2016 CLINICAL DATA:  L2-3 fusion EXAM: LUMBAR SPINE - 2-3 VIEW; DG C-ARM 61-120 MIN COMPARISON:  02/28/2014 FINDINGS: AP and lateral C-arm images were obtained of the lumbar spine in the operating room. Pre-existing pedicle screw and interbody fusion at L3-4 L4-5. Interbody spacer L5-S1 not well seen due to patient positioning. Fusion has been extended to the L2 vertebral body with bilateral pedicle screws in good position. Interbody spacer L2-3 in good position. IMPRESSION: Lumbar fusion L2 through L5 Electronically Signed   By: Franchot Gallo M.D.   On: 06/25/2016 17:33   Mr Lumbar Spine Wo Contrast  Result Date: 06/18/2016 CLINICAL DATA:  Low back pain radiating to right hip EXAM: MRI LUMBAR SPINE WITHOUT CONTRAST TECHNIQUE: Multiplanar, multisequence MR imaging of the lumbar spine was performed. No intravenous contrast was administered. The examination was ordered with IV contrast, but the patient was unable to tolerate the full length of the exam. Only precontrast imaging was obtained. COMPARISON:  Lumbar spine radiograph 06/13/2016 Lumbar spine MRI 06/14/2013 FINDINGS: Segmentation:  Normal Alignment:  Grade 1 L2-L3 retrolisthesis. Vertebrae: Posterior spinal fusion at L3-L5 with transpedicular screws and spinal  rods. No abnormal disc spacer subsidence. There is a hemangioma within the posterior inferior L1 vertebral body. Conus medullaris: Extends to the L1-2 disc level and appears normal. Paraspinal and other soft tissues: There are bilateral renal cysts again seen, slightly increased in size from the prior study. Disc levels: T12-L1: Small central disc protrusion without spinal canal or neural foraminal stenosis, unchanged. L1-L2: Normal disc space and facets. No spinal canal or neuroforaminal stenosis. L2-L3: Large central disc extrusion with superior migration, new from the prior study. Moderate bilateral facet hypertrophy. There is resultant severe spinal canal stenosis. Both neural foramina are severely narrowed. L3-L4: Postfusion changes with wide patency of the spinal canal. Evaluation of the neural foramina is partially obscured by susceptibility artifact from the spinal hardware, though there is probably at least mild narrowing bilaterally. L4-L5: Postfusion changes with wide patency of the spinal canal. Neural foramina are obscured by susceptibility from spinal hardware. L5-S1: No spinal canal or neural foraminal stenosis. Visualized sacrum: Normal. IMPRESSION: 1. New, large central disc extrusion with superior migration at L2-L3 resulting in severe spinal canal stenosis. This, in combination with moderate bilateral facet hypertrophy, also causes severe narrowing of both neural foramina. 2. PLIF at L3-L5 with wide patency of the spinal canal. Assessment of the neural foramina at these levels limited by susceptibility artifact from the spinal hardware. There is probably at least mild narrowing of the foramina at L3-L4. Electronically Signed   By: Ulyses Jarred M.D.   On: 06/18/2016 17:36   Dg C-arm 1-60 Min  Result Date: 06/25/2016 CLINICAL DATA:  L2-3 fusion EXAM: LUMBAR SPINE - 2-3 VIEW; DG C-ARM 61-120 MIN COMPARISON:  02/28/2014 FINDINGS: AP and lateral C-arm images were obtained of the  lumbar spine in the  operating room. Pre-existing pedicle screw and interbody fusion at L3-4 L4-5. Interbody spacer L5-S1 not well seen due to patient positioning. Fusion has been extended to the L2 vertebral body with bilateral pedicle screws in good position. Interbody spacer L2-3 in good position. IMPRESSION: Lumbar fusion L2 through L5 Electronically Signed   By: Franchot Gallo M.D.   On: 06/25/2016 17:33    Lab Data:  CBC:  Recent Labs Lab 06/25/16 1122 06/26/16 0808  WBC 10.6* 9.6  HGB 11.7* 9.6*  HCT 35.7* 29.5*  MCV 83.8 83.6  PLT 196 790   Basic Metabolic Panel:  Recent Labs Lab 06/25/16 1122 06/26/16 0808  NA 141 139  K 3.2* 3.8  CL 102 103  CO2 30 27  GLUCOSE 111* 252*  BUN 22* 19  CREATININE 1.17* 1.10*  CALCIUM 9.3 8.6*   GFR: Estimated Creatinine Clearance: 48.3 mL/min (by C-G formula based on SCr of 1.1 mg/dL (H)). Liver Function Tests: No results for input(s): AST, ALT, ALKPHOS, BILITOT, PROT, ALBUMIN in the last 168 hours. No results for input(s): LIPASE, AMYLASE in the last 168 hours. No results for input(s): AMMONIA in the last 168 hours. Coagulation Profile: No results for input(s): INR, PROTIME in the last 168 hours. Cardiac Enzymes: No results for input(s): CKTOTAL, CKMB, CKMBINDEX, TROPONINI in the last 168 hours. BNP (last 3 results) No results for input(s): PROBNP in the last 8760 hours. HbA1C:  Recent Labs  06/26/16 1200  HGBA1C 7.8*   CBG:  Recent Labs Lab 06/26/16 1807 06/26/16 2054 06/27/16 0413 06/27/16 0619 06/27/16 1122  GLUCAP 108* 138* 114* 116* 152*   Lipid Profile: No results for input(s): CHOL, HDL, LDLCALC, TRIG, CHOLHDL, LDLDIRECT in the last 72 hours. Thyroid Function Tests: No results for input(s): TSH, T4TOTAL, FREET4, T3FREE, THYROIDAB in the last 72 hours. Anemia Panel: No results for input(s): VITAMINB12, FOLATE, FERRITIN, TIBC, IRON, RETICCTPCT in the last 72 hours. Urine analysis:    Component Value Date/Time    COLORURINE YELLOW 05/30/2014 1623   APPEARANCEUR CLEAR 05/30/2014 1623   LABSPEC 1.015 05/30/2014 1623   PHURINE 6.0 05/30/2014 1623   GLUCOSEU 250 (A) 05/30/2014 1623   HGBUR NEGATIVE 05/30/2014 1623   BILIRUBINUR NEGATIVE 05/30/2014 1623   KETONESUR NEGATIVE 05/30/2014 1623   UROBILINOGEN 0.2 05/30/2014 1623   NITRITE NEGATIVE 05/30/2014 1623   LEUKOCYTESUR NEGATIVE 05/30/2014 1623     RAI,RIPUDEEP M.D. [facility] 06/27/2016, 2:01 PM  Pager: [phone] Between 7am to 7pm - call Pager - [phone]  After 7pm go to [email or web address] - password [email or web address]  Call night coverage person covering after 7pm

## 2016-06-28 ENCOUNTER — Inpatient Hospital Stay (HOSPITAL_COMMUNITY): Payer: Medicare Other | Admitting: Physical Therapy

## 2016-06-28 ENCOUNTER — Inpatient Hospital Stay (HOSPITAL_COMMUNITY): Payer: Medicare Other

## 2016-06-28 ENCOUNTER — Inpatient Hospital Stay (HOSPITAL_COMMUNITY): Payer: Medicare Other | Admitting: Occupational Therapy

## 2016-06-28 ENCOUNTER — Encounter (HOSPITAL_COMMUNITY): Payer: Self-pay

## 2016-06-28 DIAGNOSIS — K5903 Drug induced constipation: Secondary | ICD-10-CM

## 2016-06-28 DIAGNOSIS — E876 Hypokalemia: Secondary | ICD-10-CM

## 2016-06-28 DIAGNOSIS — M79609 Pain in unspecified limb: Secondary | ICD-10-CM

## 2016-06-28 LAB — CBC WITH DIFFERENTIAL/PLATELET
BASOS ABS: 0 10*3/uL (ref 0.0–0.1)
BASOS PCT: 0 %
EOS ABS: 0.2 10*3/uL (ref 0.0–0.7)
EOS PCT: 3 %
HCT: 31.9 % — ABNORMAL LOW (ref 36.0–46.0)
Hemoglobin: 10.3 g/dL — ABNORMAL LOW (ref 12.0–15.0)
Lymphocytes Relative: 25 %
Lymphs Abs: 2.2 10*3/uL (ref 0.7–4.0)
MCH: 27.3 pg (ref 26.0–34.0)
MCHC: 32.3 g/dL (ref 30.0–36.0)
MCV: 84.6 fL (ref 78.0–100.0)
MONO ABS: 0.8 10*3/uL (ref 0.1–1.0)
Monocytes Relative: 9 %
Neutro Abs: 5.5 10*3/uL (ref 1.7–7.7)
Neutrophils Relative %: 63 %
PLATELETS: 236 10*3/uL (ref 150–400)
RBC: 3.77 MIL/uL — ABNORMAL LOW (ref 3.87–5.11)
RDW: 15 % (ref 11.5–15.5)
WBC: 8.7 10*3/uL (ref 4.0–10.5)

## 2016-06-28 LAB — COMPREHENSIVE METABOLIC PANEL
ALBUMIN: 3.3 g/dL — AB (ref 3.5–5.0)
ALT: 47 U/L (ref 14–54)
ANION GAP: 11 (ref 5–15)
AST: 52 U/L — AB (ref 15–41)
Alkaline Phosphatase: 53 U/L (ref 38–126)
BUN: 12 mg/dL (ref 6–20)
CHLORIDE: 100 mmol/L — AB (ref 101–111)
CO2: 29 mmol/L (ref 22–32)
Calcium: 9.3 mg/dL (ref 8.9–10.3)
Creatinine, Ser: 0.96 mg/dL (ref 0.44–1.00)
GFR calc Af Amer: >60 mL/min (ref >60)
GFR calc non Af Amer: 56 mL/min — ABNORMAL LOW (ref >60)
GLUCOSE: 133 mg/dL — AB (ref 65–99)
POTASSIUM: 3.4 mmol/L — AB (ref 3.5–5.1)
Sodium: 140 mmol/L (ref 135–145)
Total Bilirubin: 0.7 mg/dL (ref 0.3–1.2)
Total Protein: 6.7 g/dL (ref 6.5–8.1)

## 2016-06-28 LAB — GLUCOSE, CAPILLARY
GLUCOSE-CAPILLARY: 152 mg/dL — AB (ref 65–99)
GLUCOSE-CAPILLARY: 157 mg/dL — AB (ref 65–99)

## 2016-06-28 MED ORDER — SORBITOL 70 % SOLN: 60.0000 mL | Freq: Every day | Status: DC | PRN

## 2016-06-28 MED ORDER — POTASSIUM CHLORIDE CRYS ER 20 MEQ PO TBCR
20.0000 meq | EXTENDED_RELEASE_TABLET | Freq: Every day | ORAL | Status: AC
  Administered 2016-06-28 – 2016-06-30 (×3): 20 meq via ORAL
  Filled 2016-06-28 (×3): qty 1

## 2016-06-28 NOTE — Progress Notes (Signed)
*  PRELIMINARY RESULTS* Vascular Ultrasound Bilateral lower extremity venous duplex has been completed.  Preliminary findings: No evidence of deep vein thrombosis or baker's cysts bilaterally.   Everrett Coombe 06/28/2016, 4:08 PM

## 2016-06-28 NOTE — IPOC Note (Signed)
Overall Plan of Care [facility]) Patient Details Name: [name] MRN: [id number] DOB: 09-24-1940  Admitting Diagnosis: Lumbar Rad SP [place] and [facility] Problems: Active Problems:   Lumbar radiculopathy     Functional Problem List: Nursing Bowel, Bladder, Medication Management, Pain, Skin Integrity, Safety  PT Balance, Endurance  OT Balance, Pain, Motor, Endurance  SLP    TR         Basic ADL's: OT Grooming, Bathing, Dressing, Toileting     Advanced  ADL's: OT Simple Meal Preparation, Light Housekeeping, Laundry     Transfers: PT Bed Mobility, Car, Bed to Chair, Manufacturing systems engineer, Metallurgist: PT Stairs, Ambulation     Additional Impairments: OT None  SLP        TR      Anticipated Outcomes Item Anticipated Outcome  Self Feeding I  Swallowing      Basic self-care  mod I  Toileting  mod I   Bathroom Transfers mod I  Bowel/Bladder  managed bowel w/mod I  Transfers  modI  Locomotion  modI  Communication     Cognition     Pain  Managed pain at or below 3 w/prn meds and alternative means  Safety/Judgment  Pt will maintain safety w/supervision cues   Therapy Plan: PT Intensity: Minimum of 1-2 x/day ,45 to 90 minutes PT Frequency: 5 out of 7 days PT Duration Estimated Length of Stay: 5-7 days OT Intensity: Minimum of 1-2 x/day, 45 to 90 minutes OT Frequency: 5 out of 7 days OT Duration/Estimated Length of Stay: 5-7 days         Team Interventions: Nursing Interventions Patient/Family Education  PT interventions Ambulation/gait training, Training and development officer, Disease management/prevention, Academic librarian, Engineer, drilling, Patient/family education, Pain management, Stair training, UE/LE Coordination activities, UE/LE Strength taining/ROM, Therapeutic Exercise, Therapeutic Activities, Functional mobility training, Discharge planning, Neuromuscular re-education, Psychosocial support  OT  Interventions Balance/vestibular training, Discharge planning, DME/adaptive equipment instruction, Functional mobility training, Pain management, Patient/family education, Self Care/advanced ADL retraining, Therapeutic Activities, Therapeutic Exercise, UE/LE Strength taining/ROM  SLP Interventions    TR Interventions    SW/CM Interventions Discharge Planning, Psychosocial Support, Patient/Family Education    Team Discharge Planning: Destination: PT-Home ,OT- Home , SLP-  Projected Follow-up: PT-Home health PT, OT-  Home health OT, SLP-  Projected Equipment Needs: PT-None recommended by PT, OT- None recommended by OT, SLP-  Equipment Details: PT-has rollator, cane, OT-  Patient/family involved in discharge planning: PT- Patient,  OT-Patient, SLP-   MD ELOS: 5-7 days Medical Rehab Prognosis:  Excellent Assessment: The patient has been admitted for CIR therapies with the diagnosis of lumbar radiculopaty/DDD s/p decompression and fusion. The team will be addressing functional mobility, strength, stamina, balance, safety, adaptive techniques and equipment, self-care, bowel and bladder mgt, patient and caregiver education, pain control, wound care, back precautions, NMR, community reintegration. Goals have been set at mod I for mobility and self- care tasks. Meredith Staggers, MD, FAAPMR      See Team Conference Notes for weekly updates to the plan of care

## 2016-06-28 NOTE — Progress Notes (Signed)
Occupational Therapy Session Note  Patient Details  Name: [name] MRN: [id number] Date of Birth: 10-06-1940  Today's Date: 06/28/2016 OT Individual Time: 1300-1355 OT Individual Time Calculation (min): 55 min    Short Term Goals: Week 1:  OT Short Term Goal 1 (Week 1): STGs = LTGs due to short LOS  Skilled Therapeutic Interventions/Progress Updates:    Pt seen supine in bed complaining of nausea and needing to have BM but no pain reported. Pt reported being hot and OT adjusted thermostat, when pt cooled down pt reports nausea goes away. Pt ambulates throughout session with RW & orthosis with significantly increased time, supervision and VC for safety awareness. While seated on BSC over toilet pt voids urine and attempts to void bowels with increased time without success. Pt able to complete all toileting and clothing management. Pt doffs clothing and dons pull over dress and orthosis with set up to obtain items. Pt completes 5 sit to stands from EOB while visitor present and discusses discharge planning and equipment needs. Pt ambulates to/from tub room an completes step over for walk in shower with grab bar to simulate d/c home. Pt left in room seated in recliner with friend present and all needs met.   Therapy Documentation Precautions:  Precautions Precautions: Fall, Back Precaution Comments: recalled 3/3 precautions with no cuing Restrictions Weight Bearing Restrictions: No ADL: ADL ADL Comments: supervision with RW See Function Navigator for Current Functional Status.   Therapy/Group: Individual Therapy  Tonny Branch 06/28/2016, 5:44 PM

## 2016-06-28 NOTE — Evaluation (Signed)
Occupational Therapy Assessment and Plan  Patient Details  Name: [name] MRN: [id number] Date of Birth: 1940-06-14  OT Diagnosis: acute pain and muscle weakness (generalized) Rehab Potential: Rehab Potential (ACUTE ONLY): Excellent ELOS: 5-7 days   Today's Date: 06/28/2016 OT Individual Time: 6720-9470 OT Individual Time Calculation (min): 60 min     Problem List:  Patient Active Problem List   Diagnosis Date Noted  . Lumbar radiculopathy 06/27/2016  . Hypothyroidism 06/26/2016  . Labile blood glucose   . Surgery, elective   . Post-operative pain   . Sickle cell trait ([place])   . Acute blood loss anemia   . History of back surgery   . AKI (acute kidney injury) ([place])   . Herniated nucleus pulposus, L2-3 06/25/2016  . Bilateral primary osteoarthritis of knee 03/26/2016  . Osteoarthritis, hand 03/26/2016  . Other insomnia 03/26/2016  . Memory disorder 02/16/2016  . Fibromyalgia 09/27/2014  . Nocturnal leg cramps 09/27/2014  . Neuropathy ([place]) 03/10/2014  . Allergic rhinitis 03/10/2014  . Osteoporosis 03/10/2014  . Spinal stenosis of lumbar region with radiculopathy 02/28/2014  . Type II or unspecified type diabetes mellitus without mention of complication, uncontrolled 07/08/2013  . Hypokalemia 11/02/2012  . Essential hypertension, benign 11/02/2012  . Diabetes mellitus with neuropathy ([place]) 10/29/2012  . Hyperlipidemia 10/29/2012  . Spondylolisthesis of lumbar region 03/19/2011  . Lumbar radicular pain 03/19/2011    Past Medical History:  Past Medical History:  Diagnosis Date  . Anemia    has sickle cell trait  . Anxiety   . Arthritis   . Asthma    has used inhaler in past for asthmatic bronchitis, last time- early 2012  . Complication of anesthesia    wakes up shaking  . Diabetes mellitus   . Dyslipidemia   . Fibromyalgia   . GERD (gastroesophageal reflux disease)    occas. use of  Prilosec  . Heart murmur    sees Dr. Montez Morita, last seen- early 2012   . Hypertension    02/2010- stress test /w PCP  . Hypothyroidism   . Neuromuscular disorder ([facility])    lumbar radiculopathy, lumbago  . Nocturnal leg cramps 09/27/2014  . Pneumonia   . Sickle cell trait ([place])   . Sleep apnea    borderline sleep apnea, states she no longer uses, early 2012- stopped using    Past Surgical History:  Past Surgical History:  Procedure Laterality Date  . ABDOMINAL HYSTERECTOMY    . adb.cyst     ovarian cyst  . BACK SURGERY     2012, 2015 (3 total)  . COLONOSCOPY    . EYE SURGERY     macular degeneration treatment - injections  . OVARIAN CYST SURGERY      Assessment & Plan Clinical Impression:[name] is a 76 y.o. right handed female with history of diabetes mellitus, fibromyalgia, CKD stage III creatinine 1.17, sickle cell trait, and back surgery 3. Per chart review patient lives alone and was using a cane prior to admission. 2 level home with bedroom first floor and 3 steps to entry. No local family that can assist. Presented 06/25/2016 with back pain radiating to the lower extremities. X-rays and imaging reviewed, showing disc herniation at L2-L3 with retrolisthesis and stenosis.  Radiculopathy at that level. Underwent bilateral laminotomies and discectomy L2-3 with decompression of central canal L2 and L3 nerve roots. Posterior lumbar interbody arthrodesis using peek spacers pedicle screw fixation L3-L5 06/25/2016 per Dr. Ellene Route. Hospital course pain management. Back brace  when out of bed. Acute blood loss anemia at 9.6. Hypokalemia 3.2. Mild elevation creatinine 1.17 from baseline 1.03. Physical and occupational therapy evaluations pending. M.D. has requested physical medicine rehabilitation consult. Patient was admitted for a comprehensive rehabilitation program.  Patient transferred to CIR on 06/27/2016 .    Patient currently requires supervision to occasional touching A with basic self-care skills secondary to muscle weakness, decreased  cardiorespiratoy endurance and decreased standing balance.  Prior to hospitalization, patient was fully independent and living alone.  Patient will benefit from skilled intervention to increase independence with basic self-care skills prior to discharge home independently.  Anticipate patient will require intermittent assist for errands, grocery shopping, etc and follow up home health.  OT - End of Session Activity Tolerance: Tolerates 10 - 20 min activity with multiple rests Endurance Deficit: Yes Endurance Deficit Description: tires after standing for several minutes OT Assessment Rehab Potential (ACUTE ONLY): Excellent OT Patient demonstrates impairments in the following area(s): Balance;Pain;Motor;Endurance OT Basic ADL's Functional Problem(s): Grooming;Bathing;Dressing;Toileting OT Advanced ADL's Functional Problem(s): Simple Meal Preparation;Light Housekeeping;Laundry OT Transfers Functional Problem(s): Toilet;Tub/Shower OT Additional Impairment(s): None OT Plan OT Intensity: Minimum of 1-2 x/day, 45 to 90 minutes OT Frequency: 5 out of 7 days OT Duration/Estimated Length of Stay: 5-7 days OT Treatment/Interventions: Balance/vestibular training;Discharge planning;DME/adaptive equipment instruction;Functional mobility training;Pain management;Patient/family education;Self Care/advanced ADL retraining;Therapeutic Activities;Therapeutic Exercise;UE/LE Strength taining/ROM OT Self Feeding Anticipated Outcome(s): I OT Basic Self-Care Anticipated Outcome(s): mod I OT Toileting Anticipated Outcome(s): mod I OT Bathroom Transfers Anticipated Outcome(s): mod I OT Recommendation Patient destination: Home Follow Up Recommendations: Home health OT Equipment Recommended: None recommended by OT   Skilled Therapeutic Intervention Pt seen for initial evaluation and ADL training with a focus on activity tolerance and standing balance. Pt correctly identified 3/3 back precautions and followed them  throughout the evaluation.  Pt used RW to ambulated from recliner to toilet and then to shower with close S and touching A to stand from tub bench.  Used long sponge to bathe.  Dressed UB, donned brace then ambulated back to recliner to rest prior to donning LB clothing. Session time complete, so nurse tech agreed to assist pt with LB dressing.  Call light and phone in reach.   OT Evaluation Precautions/Restrictions  Precautions Precautions: Fall;Back Precaution Comments: recalled 3/3 precautions with no cuing Restrictions Weight Bearing Restrictions: No   Pain Pain Assessment Pain Assessment: 0-10 Pain Score: 8  Pain Type: Acute pain Pain Location: Back Pain Orientation: Lower Pain Descriptors / Indicators: Aching Pain Onset: On-going Pain Intervention(s): RN made aware Home Living/Prior Functioning Home Living Type of Home: House Home Access: Stairs to enter Technical brewer of Steps: 2 Home Layout: Two level, Able to live on main level with bedroom/bathroom Bathroom Shower/Tub: Gaffer, Door [employer] Toilet: Handicapped height Bathroom Accessibility: Yes  Lives With: Alone Prior Function Level of Independence: Independent with basic ADLs, Independent with homemaking with ambulation, Independent with gait, Independent with transfers, Requires assistive device for independence  Able to Take Stairs?: Yes Driving: Yes Vocation: Retired ADL ADL ADL Comments: supervision with RW Vision/Perception  Vision- History Baseline Vision/History: No visual deficits Patient Visual Report: No change from baseline Vision- Assessment Vision Assessment?: No apparent visual deficits  Cognition Overall Cognitive Status: Within Functional Limits for tasks assessed Arousal/Alertness: Awake/alert Orientation Level: Person;Place;Situation Person: Oriented Place: Oriented Situation: Oriented Year: 2018 Month: March Day of Week: Correct Memory: Appears intact Immediate  Memory Recall: Sock;Blue;Bed Memory Recall: Sock;Blue;Bed Memory Recall Sock: Without Cue Memory Recall Blue:  Without Cue Memory Recall Bed: With Cue Sensation Sensation Light Touch: Appears Intact Stereognosis: Appears Intact Hot/Cold: Appears Intact Proprioception: Appears Intact Coordination Gross Motor Movements are Fluid and Coordinated: Yes Fine Motor Movements are Fluid and Coordinated: Yes Motor  Motor Motor - Skilled Clinical Observations: LE weakness Mobility    close S with RW for transfers, ambulation Trunk/Postural Assessment  Cervical Assessment Cervical Assessment: Within Functional Limits Thoracic Assessment Thoracic Assessment: Within Functional Limits Lumbar Assessment Lumbar Assessment: Within Functional Limits (follows back precautions) Postural Control Postural Control: Within Functional Limits  Balance Dynamic Sitting Balance Dynamic Sitting - Level of Assistance: 5: Stand by assistance Static Standing Balance Static Standing - Level of Assistance: 5: Stand by assistance Dynamic Standing Balance Dynamic Standing - Level of Assistance: 4: Min assist Extremity/Trunk Assessment RUE Assessment RUE Assessment: Within Functional Limits LUE Assessment LUE Assessment: Within Functional Limits   See Function Navigator for Current Functional Status.   Refer to Care Plan for Long Term Goals  Recommendations for other services: None    Discharge Criteria: Patient will be discharged from OT if patient refuses treatment 3 consecutive times without medical reason, if treatment goals not met, if there is a change in medical status, if patient makes no progress towards goals or if patient is discharged from hospital.  The above assessment, treatment plan, treatment alternatives and goals were discussed and mutually agreed upon: by patient  [facility] 06/28/2016, 12:25 PM

## 2016-06-28 NOTE — Progress Notes (Signed)
[facility] PHYSICAL MEDICINE & REHABILITATION     PROGRESS NOTE    Subjective/Complaints: Had an uneventful night. Pain around 7/10. Hasn't taken anything for pain yet this morning. Appetite good. Hasn't had a bm yet  ROS: pt denies nausea, vomiting, diarrhea, cough, shortness of breath or chest pain   Objective: Vital Signs: Blood pressure 118/62, pulse 75, temperature 98.4 F (36.9 C), temperature source Oral, resp. rate 16, SpO2 97 %. No results found.  Recent Labs  06/26/16 0808 06/28/16 0817  WBC 9.6 8.7  HGB 9.6* 10.3*  HCT 29.5* 31.9*  PLT 214 236    Recent Labs  06/26/16 0808 06/28/16 0817  NA 139 140  K 3.8 3.4*  CL 103 100*  GLUCOSE 252* 133*  BUN 19 12  CREATININE 1.10* 0.96  CALCIUM 8.6* 9.3   CBG (last 3)   Recent Labs  06/27/16 1122 06/27/16 1637 06/27/16 2029  GLUCAP 152* 153* 152*    Wt Readings from Last 3 Encounters:  06/25/16 86.8 kg (191 lb 5.8 oz)  06/04/16 80.7 kg (178 lb)  05/18/16 79.4 kg (175 lb)    Physical Exam:  Constitutional: NAD.  HENT:  Head: Normocephalic.  Eyes: EOM are normal.  Neck: supple no jvd.  Cardiovascular: RRR.   Respiratory: CTA B, no wheezes or distress.  GI: Soft. Bowel sounds are normal. She exhibits no distension.  Skin. Clean and dry. Back incision clean. ?small area of serosang drainage at inferior aspect of incision Neurological: She is alertand oriented to person, place, and time.  Motor: LLE: 3 to 3+/5 HF, KE 4/5 L ADF, PF, 5/5 RLE 5/5 BUE DTRs symmetric Sensation intact to light touchin bilateral LE's Psych: pleasant and cooperative.   Assessment/Plan: 1. Functional and mobility deficits secondary to lumbar radiculopathy s/p laminectomy/discectomy/fusion which require 3+ hours per day of interdisciplinary therapy in a comprehensive inpatient rehab setting. Physiatrist is providing close team supervision and 24 hour management of active medical problems listed below. Physiatrist and  rehab team continue to assess barriers to discharge/monitor patient progress toward functional and medical goals.  Function:  Bathing Bathing position      Bathing parts      Bathing assist        Upper Body Dressing/Undressing Upper body dressing                    Upper body assist        Lower Body Dressing/Undressing Lower body dressing                                  Lower body assist        Toileting Toileting          Toileting assist     Transfers Chair/bed transfer             Locomotion Ambulation           Wheelchair          Cognition Comprehension    Expression    Social Interaction    Problem Solving    Memory      Medical Problem List and Plan: 1.  Decreased functional mobility secondary to Lumbar radiculopathy status post laminectomy discectomy with pedicle screw fixation 06/25/2016. Back brace when out of bed applied the sitting position. May ambulate to bathroom without brace. May remove brace to shower.  -begin CIR therapies 2.  DVT Prophylaxis/Anticoagulation: SCDs.  Check vascular study today 3. Pain Management: Baclofen 10 mg daily at bedtime, Neurontin 300 mg 3 times a day, hydrocodone as needed, Lyrica 50 mg daily at bedtime as needed  -encouraged her to work with RN/schedule to time hydrocodone before periods of activity 4. Mood: Provide emotional support 5. Neuropsych: This patient is capable of making decisions on her own behalf. 6. Skin/Wound Care: Routine skin checks  -continue dry dressing for back wound. Can shower with wound covered 7. Fluids/Electrolytes/Nutrition: encourage PO  -I personally reviewed the patient's labs today.   -replete K+ 8. Hypertension. Avapro 300 mg daily, HCTZ 12.5 mg daily. Monitor with increased mobility 9. Diabetes mellitus and peripheral neuropathy. Hemoglobin A1c 7.8. Levemir 12 units daily at bedtime, Amaryl 2 mg daily. Check blood sugars before meals and at  bedtime. Diabetic teaching 10. CKD stage III creatinine 1.17 baseline. 0.96 today 3/9  11. Asthma. Continue inhalers as directed. Chest clear at present 12. Sickle cell trait. 13. Constipation. Laxative assistance--sorbitol today if no bm by end of day 14. Hyperlipidemia. Zetia10 mg daily 15. Hypothyroidism. Synthroid   LOS (Days) 1 A FACE TO FACE EVALUATION WAS PERFORMED  Meredith Staggers, MD 06/28/2016 9:57 AM

## 2016-06-28 NOTE — Progress Notes (Signed)
Pt alert and oriented, supine in bed.  Pt ambulated to the bathroom with minimal assistance with use of rolling walker.  Pt tolerated well, but asked for pain medication when back in the bed.  Pt's lumbar region has a small incision w/2/2 dressing intact, clean and dry.  Discussed bed controls, safety and use of call light and bed controls.  Pt taught back information given.  Pt denied needs except for pain medication and something to help her bowels to move.  Bed low and locked, pt supine in bed, high fowler's position.  Extra pillows for support.  SCDs ordered from materials management.  [name]

## 2016-06-28 NOTE — Progress Notes (Signed)
Ankit Lorie Phenix, MD Physician Signed Physical Medicine and Rehabilitation  Consult Note Date of Service: 06/26/2016 9:35 AM  Related encounter: Admission (Discharged) from 06/25/2016 in [place] All Collapse All   '[]' Hide copied text '[]' Hover for attribution information      Physical Medicine and Rehabilitation Consult Reason for Consult: Lumbar radiculopathy/HNP Referring Physician: Dr. Ellene Route   HPI: [name] is a 76 y.o. right handed female with history of diabetes mellitus, fibromyalgia, sickle cell trait, and back surgery 3. Per chart review patient lives alone and was using a cane prior to admission. 2 level home with bedroom first floor and 3 steps to entry. No local family that can assist. Presented 06/25/2016 with back pain radiating to the lower extremities. X-rays and imaging reviewed, showing disc herniation at L2-L3 with retrolisthesis and stenosis.  Radiculopathy at that level. Underwent bilateral laminotomies and discectomy L2-3 with decompression of central canal L2 and L3 nerve roots. Posterior lumbar interbody arthrodesis using peek spacers pedicle screw fixation L3-L5 06/25/2016 per Dr. Ellene Route. Hospital course pain management. Back brace when out of bed. Acute blood loss anemia at 9.6. Hypokalemia 3.2. Mild elevation creatinine 1.17 from baseline 1.03. Physical and occupational therapy evaluations pending. M.D. has requested physical medicine rehabilitation consult.   Review of Systems  Constitutional: Negative for chills and fever.  HENT: Negative for hearing loss and tinnitus.   Eyes: Negative for blurred vision and double vision.  Respiratory: Negative for cough.   Cardiovascular: Negative for chest pain, palpitations and leg swelling.  Gastrointestinal: Positive for constipation. Negative for nausea and vomiting.  Genitourinary: Negative for dysuria, flank pain and hematuria.  Musculoskeletal:  Positive for back pain and myalgias.  Skin: Negative for rash.  Neurological: Positive for weakness. Negative for seizures.  Psychiatric/Behavioral:       Anxiety  All other systems reviewed and are negative.      Past Medical History:  Diagnosis Date  . Anemia    has sickle cell trait  . Anxiety   . Arthritis   . Asthma    has used inhaler in past for asthmatic bronchitis, last time- early 2012  . Complication of anesthesia    wakes up shaking  . Diabetes mellitus   . Dyslipidemia   . Fibromyalgia   . GERD (gastroesophageal reflux disease)    occas. use of  Prilosec  . Heart murmur    sees Dr. Montez Morita, last seen- early 2012  . Hypertension    02/2010- stress test /w PCP  . Hypothyroidism   . Neuromuscular disorder (HCC)    lumbar radiculopathy, lumbago  . Nocturnal leg cramps 09/27/2014  . Pneumonia   . Sickle cell trait ([place])   . Sleep apnea    borderline sleep apnea, states she no longer uses, early 2012- stopped using         Past Surgical History:  Procedure Laterality Date  . ABDOMINAL HYSTERECTOMY    . adb.cyst     ovarian cyst  . BACK SURGERY     2012, 2015 (3 total)  . COLONOSCOPY    . EYE SURGERY     macular degeneration treatment - injections  . OVARIAN CYST SURGERY          Family History  Problem Relation Age of Onset  . Ovarian cancer Mother   . Cancer - Prostate Father   . Breast cancer Paternal Aunt   . Multiple myeloma Paternal Aunt   .  Anesthesia problems Neg Hx   . Hypotension Neg Hx   . Malignant hyperthermia Neg Hx   . Pseudochol deficiency Neg Hx    Social History:  reports that she quit smoking about 17 years ago. She has never used smokeless tobacco. She reports that she drinks alcohol. She reports that she does not use drugs. Allergies:  Allergies  Allergen Reactions  . Betadine [Povidone Iodine] Swelling    Reaction to betadine eye drops  . Cymbalta [Duloxetine Hcl] Other  (See Comments)    Dizziness   . Adhesive [Tape] Rash  . Lipitor [Atorvastatin] Rash         Medications Prior to Admission  Medication Sig Dispense Refill  . baclofen (LIORESAL) 10 MG tablet 1 pill upto TID if needed for muscle spasm. (Patient taking differently: Take 10 mg by mouth at bedtime. ) 90 tablet 1  . BD PEN NEEDLE NANO U/F 32G X 4 MM MISC USE AS DIRECTED 100 each 0  . calcium carbonate (TUMS EX) 750 MG chewable tablet Chew 1 tablet by mouth 2 (two) times daily as needed for heartburn (indigestion).    . Calcium-Magnesium-Zinc (CAL-MAG-ZINC PO) Take 1 tablet by mouth daily.    . cetirizine (ZYRTEC) 10 MG tablet Take 10 mg by mouth daily.    . diclofenac sodium (VOLTAREN) 1 % GEL Apply 4 g topically 4 (four) times daily. Voltaren Gel 3 grams to 3 large joints upto TID 3 TUBES with 3 refills (Patient taking differently: Apply 4 g topically 2 (two) times daily as needed (muscle pain). ) 3 Tube 3  . ezetimibe (ZETIA) 10 MG tablet TAKE 1 TABLET(10 MG) BY MOUTH DAILY 30 tablet 0  . gabapentin (NEURONTIN) 300 MG capsule TAKE 1 CAPSULE(300 MG) BY MOUTH THREE TIMES DAILY 90 capsule 0  . glimepiride (AMARYL) 2 MG tablet TAKE 2 TABLETS BY MOUTH EVERY MORNING AND THEN TAKE 2 TABLETS EVERY EVENING 120 tablet 0  . HYDROcodone-acetaminophen (NORCO/VICODIN) 5-325 MG tablet Take 1 tablet by mouth every 6 (six) hours as needed for moderate pain. (Patient taking differently: Take 1-2 tablets by mouth every 6 (six) hours as needed for moderate pain. ) 15 tablet 0  . ibuprofen (ADVIL,MOTRIN) 800 MG tablet Take 800 mg by mouth 3 (three) times daily as needed (pain).     . Insulin Detemir (LEVEMIR FLEXTOUCH) 100 UNIT/ML Pen INJECT 14 UNITS INTO THE SKIN DAILY AT 10PM 15 mL 0  . LEVEMIR FLEXTOUCH 100 UNIT/ML Pen INJECT 8 UNITS INTO THE SKIN DAILY AT 10PM (Patient taking differently: INJECT 15 UNITS INTO THE SKIN DAILY AT 10PM) 15 mL 0  . levothyroxine (SYNTHROID, LEVOTHROID) 25 MCG tablet TAKE 1  TABLET(25 MCG) BY MOUTH DAILY BEFORE BREAKFAST (Patient taking differently: Take 12.70mg once daily in the morning) 90 tablet 0  . LYRICA 50 MG capsule Take 50 mg by mouth at bedtime as needed (pain). Reported on 07/06/2015    . NOVOLOG FLEXPEN 100 UNIT/ML FlexPen INJECT 6 UNITS UNDER THE SKIN BEFORE BREAKFAST, 10 UNITS BEFORE LUNCH, AND 9 UNITS BEFORE SUPPER 15 mL 0  . PROAIR RESPICLICK 1195(90 Base) MCG/ACT AEPB Inhale 2 puffs into the lungs 3 (three) times daily as needed (shortness of breath).     . SYMBICORT 80-4.5 MCG/ACT inhaler Inhale 2 puffs into the lungs 2 (two) times daily as needed (shortness of breath).     . valsartan-hydrochlorothiazide (DIOVAN-HCT) 320-12.5 MG tablet Take 1 tablet by mouth daily.    . vitamin B-12 (CYANOCOBALAMIN) 500 MCG  tablet Take 500 mcg by mouth daily.    . vitamin E 400 UNIT capsule Take 400 Units by mouth daily.    Marland Kitchen aspirin 81 MG EC tablet Take 13m by mouth once daily  3  . azithromycin (ZITHROMAX) 250 MG tablet Take one pill a day (Patient not taking: Reported on 06/24/2016) 4 tablet 0    Home: Home Living Family/patient expects to be discharged to:: Private residence Living Arrangements: Alone  Functional History: Functional Status:  Mobility:  ADL:  Cognition: Cognition Orientation Level: Oriented X4  Blood pressure (!) 101/58, pulse 91, temperature 99 F (37.2 C), temperature source Oral, resp. rate 20, height '5\' 6"'  (1.676 m), weight 86.8 kg (191 lb 5.8 oz), SpO2 94 %. Physical Exam  Vitals reviewed. Constitutional: She is oriented to person, place, and time. She appears well-developed and well-nourished.  HENT:  Head: Normocephalic and atraumatic.  Eyes: Conjunctivae and EOM are normal.  Neck: Normal range of motion. Neck supple. No thyromegaly present.  Cardiovascular: Normal rate and regular rhythm.   Respiratory: Effort normal and breath sounds normal. No respiratory distress.  GI: Soft. Bowel sounds are normal.  She exhibits no distension.  Musculoskeletal:  No edema or tenderness in extremities  Neurological: She is alert and oriented to person, place, and time.  Motor: B/l UE 5/5 proximal to distal B/l LE: 4+/5 proximal to distal DTRs symmetric Sensation intact to light touch  Skin: Skin is warm and dry.  Back incision clean and dry  Psychiatric: She has a normal mood and affect. Her behavior is normal.    Lab Results Last 24 Hours       Results for orders placed or performed during the hospital encounter of 06/25/16 (from the past 24 hour(s))  Type and screen     Status: None   Collection Time: 06/25/16 11:07 AM  Result Value Ref Range   ABO/RH(D) B POS    Antibody Screen NEG    Sample Expiration 06/28/2016   Glucose, capillary     Status: Abnormal   Collection Time: 06/25/16 11:18 AM  Result Value Ref Range   Glucose-Capillary 114 (H) 65 - 99 mg/dL  Basic metabolic panel     Status: Abnormal   Collection Time: 06/25/16 11:22 AM  Result Value Ref Range   Sodium 141 135 - 145 mmol/L   Potassium 3.2 (L) 3.5 - 5.1 mmol/L   Chloride 102 101 - 111 mmol/L   CO2 30 22 - 32 mmol/L   Glucose, Bld 111 (H) 65 - 99 mg/dL   BUN 22 (H) 6 - 20 mg/dL   Creatinine, Ser 1.17 (H) 0.44 - 1.00 mg/dL   Calcium 9.3 8.9 - 10.3 mg/dL   GFR calc non Af Amer 44 (L) >60 mL/min   GFR calc Af Amer 51 (L) >60 mL/min   Anion gap 9 5 - 15  CBC     Status: Abnormal   Collection Time: 06/25/16 11:22 AM  Result Value Ref Range   WBC 10.6 (H) 4.0 - 10.5 K/uL   RBC 4.26 3.87 - 5.11 MIL/uL   Hemoglobin 11.7 (L) 12.0 - 15.0 g/dL   HCT 35.7 (L) 36.0 - 46.0 %   MCV 83.8 78.0 - 100.0 fL   MCH 27.5 26.0 - 34.0 pg   MCHC 32.8 30.0 - 36.0 g/dL   RDW 14.6 11.5 - 15.5 %   Platelets 196 150 - 400 K/uL  Surgical pcr screen     Status: None   Collection Time:  06/25/16 11:22 AM  Result Value Ref Range   MRSA, PCR NEGATIVE NEGATIVE   Staphylococcus aureus NEGATIVE NEGATIVE    Glucose, capillary     Status: None   Collection Time: 06/25/16  1:21 PM  Result Value Ref Range   Glucose-Capillary 83 65 - 99 mg/dL   Comment 1 Notify RN    Comment 2 Document in Chart   Glucose, capillary     Status: None   Collection Time: 06/25/16  2:19 PM  Result Value Ref Range   Glucose-Capillary 69 65 - 99 mg/dL  Glucose, capillary     Status: None   Collection Time: 06/25/16  5:05 PM  Result Value Ref Range   Glucose-Capillary 83 65 - 99 mg/dL  Glucose, capillary     Status: None   Collection Time: 06/25/16  5:53 PM  Result Value Ref Range   Glucose-Capillary 94 65 - 99 mg/dL   Comment 1 Notify RN   Glucose, capillary     Status: Abnormal   Collection Time: 06/25/16  9:09 PM  Result Value Ref Range   Glucose-Capillary 211 (H) 65 - 99 mg/dL   Comment 1 Notify RN    Comment 2 Document in Chart   Glucose, capillary     Status: Abnormal   Collection Time: 06/26/16  6:18 AM  Result Value Ref Range   Glucose-Capillary 297 (H) 65 - 99 mg/dL   Comment 1 Notify RN    Comment 2 Document in Chart   CBC     Status: Abnormal   Collection Time: 06/26/16  8:08 AM  Result Value Ref Range   WBC 9.6 4.0 - 10.5 K/uL   RBC 3.53 (L) 3.87 - 5.11 MIL/uL   Hemoglobin 9.6 (L) 12.0 - 15.0 g/dL   HCT 29.5 (L) 36.0 - 46.0 %   MCV 83.6 78.0 - 100.0 fL   MCH 27.2 26.0 - 34.0 pg   MCHC 32.5 30.0 - 36.0 g/dL   RDW 14.9 11.5 - 15.5 %   Platelets 214 150 - 400 K/uL  Basic Metabolic Panel     Status: Abnormal   Collection Time: 06/26/16  8:08 AM  Result Value Ref Range   Sodium 139 135 - 145 mmol/L   Potassium 3.8 3.5 - 5.1 mmol/L   Chloride 103 101 - 111 mmol/L   CO2 27 22 - 32 mmol/L   Glucose, Bld 252 (H) 65 - 99 mg/dL   BUN 19 6 - 20 mg/dL   Creatinine, Ser 1.10 (H) 0.44 - 1.00 mg/dL   Calcium 8.6 (L) 8.9 - 10.3 mg/dL   GFR calc non Af Amer 48 (L) >60 mL/min   GFR calc Af Amer 55 (L) >60 mL/min   Anion gap 9 5 - 15      Imaging  Results (Last 48 hours)  Dg Lumbar Spine 2-3 Views  Result Date: 06/25/2016 CLINICAL DATA:  L2-3 fusion EXAM: LUMBAR SPINE - 2-3 VIEW; DG C-ARM 61-120 MIN COMPARISON:  02/28/2014 FINDINGS: AP and lateral C-arm images were obtained of the lumbar spine in the operating room. Pre-existing pedicle screw and interbody fusion at L3-4 L4-5. Interbody spacer L5-S1 not well seen due to patient positioning. Fusion has been extended to the L2 vertebral body with bilateral pedicle screws in good position. Interbody spacer L2-3 in good position. IMPRESSION: Lumbar fusion L2 through L5 Electronically Signed   By: Franchot Gallo M.D.   On: 06/25/2016 17:33   Dg C-arm 1-60 Min  Result Date: 06/25/2016 CLINICAL  DATA:  L2-3 fusion EXAM: LUMBAR SPINE - 2-3 VIEW; DG C-ARM 61-120 MIN COMPARISON:  02/28/2014 FINDINGS: AP and lateral C-arm images were obtained of the lumbar spine in the operating room. Pre-existing pedicle screw and interbody fusion at L3-4 L4-5. Interbody spacer L5-S1 not well seen due to patient positioning. Fusion has been extended to the L2 vertebral body with bilateral pedicle screws in good position. Interbody spacer L2-3 in good position. IMPRESSION: Lumbar fusion L2 through L5 Electronically Signed   By: Franchot Gallo M.D.   On: 06/25/2016 17:33     Assessment/Plan: Diagnosis: Lumbar radiculopathy s/p lamy and discectomy Labs and images independently reviewed.  Records reviewed and summated above.  1. Does the need for close, 24 hr/day medical supervision in concert with the patient's rehab needs make it unreasonable for this patient to be served in a less intensive setting? Yes 2. Co-Morbidities requiring supervision/potential complications: diabetes mellitus, extremely labile at present (Monitor in accordance with exercise and adjust meds as necessary), fibromyalgia (Biofeedback training with therapies to help reduce reliance on opiate pain medications, monitor pain control during therapies,  and sedation at rest and titrate to maximum efficacy to ensure participation and gains in therapies), post-op pain (see previous), sickle cell trait (ensure appropriate hydration), and back surgery 3, Acute blood loss anemia (transfuse if necessary to ensure appropriate perfusion for increased activity tolerance), hypokalemia (continue to monitor and replete as necessary), AKI (avoid nephrotoxic meds),  3. Due to bladder management, safety, skin/wound care, disease management, pain management and patient education, does the patient require 24 hr/day rehab nursing? Yes 4. Does the patient require coordinated care of a physician, rehab nurse, PT (1-2 hrs/day, 5 days/week) and OT (1-2 hrs/day, 5 days/week) to address physical and functional deficits in the context of the above medical diagnosis(es)? Yes Addressing deficits in the following areas: balance, endurance, locomotion, strength, transferring, bathing, dressing, toileting and psychosocial support 5. Can the patient actively participate in an intensive therapy program of at least 3 hrs of therapy per day at least 5 days per week? Yes 6. The potential for patient to make measurable gains while on inpatient rehab is excellent 7. Anticipated functional outcomes upon discharge from inpatient rehab are modified independent  with PT, modified independent with OT, n/a with SLP. 8. Estimated rehab length of stay to reach the above functional goals is: 8-12 days. 9. Does the patient have adequate social supports and living environment to accommodate these discharge functional goals? Yes 10. Anticipated D/C setting: Home 11. Anticipated post D/C treatments: HH therapy and Home excercise program 12. Overall Rehab/Functional Prognosis: excellent and good  RECOMMENDATIONS: This patient's condition is appropriate for continued rehabilitative care in the following setting: CIR Patient has agreed to participate in recommended program. Potentially Note that  insurance prior authorization may be required for reimbursement for recommended care.  Comment: Rehab Admissions Coordinator to follow up.  Delice Lesch, MD, Mellody Drown Cathlyn Parsons., PA-C 06/26/2016

## 2016-06-28 NOTE — Evaluation (Signed)
Physical Therapy Assessment and Plan  Patient Details  Name: [name] MRN: [id number] Date of Birth: 25-Nov-1940  PT Diagnosis: Abnormality of gait, Difficulty walking, Muscle weakness and Pain in back Rehab Potential: Excellent ELOS: 5-7 days   Today's Date: 06/28/2016 PT Individual Time: 1430-1530 PT Individual Time Calculation (min): 60 min    Problem List:  Patient Active Problem List   Diagnosis Date Noted  . Lumbar radiculopathy 06/27/2016  . Hypothyroidism 06/26/2016  . Labile blood glucose   . Surgery, elective   . Post-operative pain   . Sickle cell trait ([place])   . Acute blood loss anemia   . History of back surgery   . AKI (acute kidney injury) ([place])   . Herniated nucleus pulposus, L2-3 06/25/2016  . Bilateral primary osteoarthritis of knee 03/26/2016  . Osteoarthritis, hand 03/26/2016  . Other insomnia 03/26/2016  . Memory disorder 02/16/2016  . Fibromyalgia 09/27/2014  . Nocturnal leg cramps 09/27/2014  . Neuropathy ([place]) 03/10/2014  . Allergic rhinitis 03/10/2014  . Osteoporosis 03/10/2014  . Spinal stenosis of lumbar region with radiculopathy 02/28/2014  . Type II or unspecified type diabetes mellitus without mention of complication, uncontrolled 07/08/2013  . Hypokalemia 11/02/2012  . Essential hypertension, benign 11/02/2012  . Diabetes mellitus with neuropathy ([place]) 10/29/2012  . Hyperlipidemia 10/29/2012  . Spondylolisthesis of lumbar region 03/19/2011  . Lumbar radicular pain 03/19/2011    Past Medical History:  Past Medical History:  Diagnosis Date  . Anemia    has sickle cell trait  . Anxiety   . Arthritis   . Asthma    has used inhaler in past for asthmatic bronchitis, last time- early 2012  . Complication of anesthesia    wakes up shaking  . Diabetes mellitus   . Dyslipidemia   . Fibromyalgia   . GERD (gastroesophageal reflux disease)    occas. use of  Prilosec  . Heart murmur    sees Dr. Montez Morita, last seen- early 2012  .  Hypertension    02/2010- stress test /w PCP  . Hypothyroidism   . Neuromuscular disorder ([facility])    lumbar radiculopathy, lumbago  . Nocturnal leg cramps 09/27/2014  . Pneumonia   . Sickle cell trait ([place])   . Sleep apnea    borderline sleep apnea, states she no longer uses, early 2012- stopped using    Past Surgical History:  Past Surgical History:  Procedure Laterality Date  . ABDOMINAL HYSTERECTOMY    . adb.cyst     ovarian cyst  . BACK SURGERY     2012, 2015 (3 total)  . COLONOSCOPY    . EYE SURGERY     macular degeneration treatment - injections  . OVARIAN CYST SURGERY      Assessment & Plan Clinical Impression: Julia F. Mosbyis a 76 y.o.right handed femalewith history of diabetes mellitus, fibromyalgia,CKD stage III creatinine 1.17,sickle cell trait, andback surgery 3.Per chart review patient lives alone and was using a cane prior to admission. 2 level home with bedroom first floor and 3 steps for entry. No local family that can assist.Presented 06/25/2016 with back pain radiating to the lower extremities. X-rays and imaging reviewed, showing disc herniation at L2-L3 with retrolisthesis and stenosis. Radiculopathy at that level. Underwent bilateral laminotomies and discectomy L2-3 with decompression of central canal L2 and L3 nerve roots. Posterior lumbar interbody arthrodesis using peek spacers pedicle screw fixation L3-L5 06/25/2016 per Dr. Ellene Route. Hospital course pain management. Back brace when out of bed.Acute blood loss anemia at  9.6. Hypokalemia 3.2. Mild elevation creatinine 1.17 from baseline 1.03. Physical and occupational therapy evaluations pending. M.D. has requested physical medicine rehabilitation consult.Patient was admitted for a comprehensive rehabilitation program 06/27/16.   Patient transferred to CIR on 06/27/2016 .   Patient currently requires supervision with mobility secondary to muscle weakness, decreased cardiorespiratoy endurance and decreased  standing balance, decreased postural control and difficulty maintaining precautions.  Prior to hospitalization, patient was modified independent  with mobility and lived with Alone in a House home.  Home access is 2Stairs to enter.  Patient will benefit from skilled PT intervention to maximize safe functional mobility and minimize fall risk for planned discharge home with intermittent assist.  Anticipate patient will benefit from follow up [facility] at discharge.  PT - End of Session Activity Tolerance: Tolerates 30+ min activity with multiple rests Endurance Deficit: Yes Endurance Deficit Description: tires after standing for several minutes PT Assessment Rehab Potential (ACUTE/IP ONLY): Excellent Barriers to Discharge: Decreased caregiver support PT Patient demonstrates impairments in the following area(s): Balance;Endurance PT Transfers Functional Problem(s): Bed Mobility;Car;Bed to Chair;Furniture PT Locomotion Functional Problem(s): Stairs;Ambulation PT Plan PT Intensity: Minimum of 1-2 x/day ,45 to 90 minutes PT Frequency: 5 out of 7 days PT Duration Estimated Length of Stay: 5-7 days PT Treatment/Interventions: Ambulation/gait training;Balance/vestibular training;Disease management/prevention;Community reintegration;DME/adaptive equipment instruction;Patient/family education;Pain management;Stair training;UE/LE Coordination activities;UE/LE Strength taining/ROM;Therapeutic Exercise;Therapeutic Activities;Functional mobility training;Discharge planning;Neuromuscular re-education;Psychosocial support PT Transfers Anticipated Outcome(s): modI PT Locomotion Anticipated Outcome(s): modI PT Recommendation Recommendations for Other Services: Therapeutic Recreation consult Therapeutic Recreation Interventions: Pet therapy;Kitchen group;Outing/community reintergration Follow Up Recommendations: Home health PT Patient destination: Home Equipment Recommended: None recommended by PT Equipment Details:  has rollator, cane  Skilled Therapeutic Intervention Pt received seated in recliner, c/o pain as below and agreeable to treatment. PT initial assessment performed and completed as described below with S overall. Pt limited primarily by pain and mild strength deficits in LEs. Pt declines performing stairs d/t pain. Educated pt in rehab process, goals, estimated length of stay, and falls prevention safety; pt agreeable to all the above. Returned to bed with S at end of session and handoff to transport staff at end of session.   PT Evaluation Precautions/Restrictions Precautions Precautions: Fall;Back Precaution Comments: recalled 3/3 precautions with no cuing Restrictions Weight Bearing Restrictions: No General Chart Reviewed: Yes Response to Previous Treatment: Patient reporting fatigue but able to participate. Family/Caregiver Present: No   Vital SignsTherapy Vitals Temp: 98.5 F (36.9 C) Temp Source: Oral Pulse Rate: 96 Resp: 18 BP: (!) 151/60 Patient Position (if appropriate): Sitting Oxygen Therapy SpO2: 96 % O2 Device: Not Delivered Pain Pain Assessment Pain Assessment: 0-10 Pain Score: 8  Pain Location: Back Pain Descriptors / Indicators: Aching Pain Frequency: Intermittent Patients Stated Pain Goal: 4 Pain Intervention(s): Medication (See eMAR) Home Living/Prior Functioning Home Living Type of Home: House Home Access: Stairs to enter Entrance Stairs-Number of Steps: 2 Home Layout: Two level;Able to live on main level with bedroom/bathroom Bathroom Shower/Tub: Walk-in shower;Door [employer] Toilet: Handicapped height Bathroom Accessibility: Yes  Lives With: Alone Prior Function Level of Independence: Independent with basic ADLs;Independent with homemaking with ambulation;Independent with gait;Independent with transfers;Requires assistive device for independence  Able to Take Stairs?: Yes Driving: Yes Vocation: Retired Vision/Perception    WFL Cognition Overall  Cognitive Status: Within Functional Limits for tasks assessed Arousal/Alertness: Awake/alert Memory: Appears intact Sensation Sensation Light Touch: Appears Intact Stereognosis: Appears Intact Hot/Cold: Appears Intact Proprioception: Appears Intact Coordination Gross Motor Movements are Fluid and Coordinated: Yes Fine Motor Movements  are Fluid and Coordinated: Yes Motor  Motor Motor - Skilled Clinical Observations: LE weakness  Mobility Bed Mobility Bed Mobility: Supine to Sit;Sit to Supine Supine to Sit: 5: Supervision;HOB flat;With rails Supine to Sit Details: Verbal cues for technique;Verbal cues for precautions/safety Sit to Supine: 5: Supervision;With rail;HOB flat Sit to Supine - Details: Verbal cues for technique;Verbal cues for precautions/safety Transfers Transfers: Yes Sit to Stand: 5: Supervision Sit to Stand Details: Verbal cues for technique;Verbal cues for precautions/safety Stand to Sit: 5: Supervision Stand to Sit Details (indicate cue type and reason): Verbal cues for precautions/safety;Verbal cues for technique Stand Pivot Transfers: 5: Supervision Stand Pivot Transfer Details: Verbal cues for precautions/safety;Verbal cues for technique Locomotion  Ambulation Ambulation: Yes Ambulation/Gait Assistance: 5: Supervision Ambulation Distance (Feet): 200 Feet Assistive device: Rollator Ambulation/Gait Assistance Details: Verbal cues for precautions/safety;Verbal cues for technique Gait Gait velocity: Decreased for age/gender norms, slower with cognitive dual task Stairs / Additional Locomotion Stairs: No (declined d/t pain) Ramp: 5: Supervision Wheelchair Mobility Wheelchair Mobility: No  Trunk/Postural Assessment  Cervical Assessment Cervical Assessment: Within Functional Limits Thoracic Assessment Thoracic Assessment: Within Functional Limits Lumbar Assessment Lumbar Assessment: Within Functional Limits (follows back precautions) Postural  Control Postural Control: Within Functional Limits  Balance Dynamic Sitting Balance Dynamic Sitting - Level of Assistance: 5: Stand by assistance Static Standing Balance Static Standing - Level of Assistance: 5: Stand by assistance Dynamic Standing Balance Dynamic Standing - Level of Assistance: 4: Min assist Extremity Assessment  RUE Assessment RUE Assessment: Within Functional Limits LUE Assessment LUE Assessment: Within Functional Limits RLE Assessment RLE Assessment: Within Functional Limits (4+/5 to 5/5 throughout) LLE Assessment LLE Assessment: Within Functional Limits (4+/5 to 5/5 throughout)   See Function Navigator for Current Functional Status.   Refer to Care Plan for Long Term Goals  Recommendations for other services: Therapeutic Recreation  Pet therapy, Kitchen group and Outing/community reintegration  Discharge Criteria: Patient will be discharged from PT if patient refuses treatment 3 consecutive times without medical reason, if treatment goals not met, if there is a change in medical status, if patient makes no progress towards goals or if patient is discharged from hospital.  The above assessment, treatment plan, treatment alternatives and goals were discussed and mutually agreed upon: by patient  Luberta Mutter 06/28/2016, 2:44 PM

## 2016-06-28 NOTE — Progress Notes (Signed)
[facility] Admission Coordinator Signed Physical Medicine and Rehabilitation  PMR Pre-admission Date of Service: 06/27/2016 1:48 PM  Related encounter: Admission (Discharged) from 06/25/2016 in [place] [facility]       _0 Hide copied text PMR Admission Coordinator Pre-Admission Assessment  Patient: [name] is an 76 y.o., female MRN: [id number] DOB: 23-Aug-1940 Height: _1  (167.6 cm) Weight: 86.8 kg (191 lb 5.8 oz)                                                                                                                                                  Insurance Information HMO:     PPO:      PCP:      IPA:      80/20:      OTHER:  PRIMARY: Medicare A & B      Policy#: [id number]      Subscriber: Self CM Name:       Phone#:      Fax#:  Pre-Cert#: eligible via [employer] Online Portal       Employer: Retired  Benefits:  Phone #:      Name:  Eff. Date: 11/20/04     Deduct: $1340      Out of Pocket Max: N/A      Life Max: N/A CIR: 100%      SNF: 100% days 1-20; 80% days 21-100 Outpatient: 80%     Co-Pay: 20% Home Health: 100%      Co-Pay: 0 DME: 80%     Co-Pay: 20% Providers: patient's choice   SECONDARY: [facility]      Policy#: [id number]      Subscriber: Self CM Name:       Phone#:      Fax#:  Pre-Cert#:       Employer: Retired  Benefits:  Phone #: [phone]     Name:  Eff. Date:      Deduct:       Out of Pocket Max:       Life Max:  CIR:       SNF:  Outpatient:      Co-Pay:  Home Health:       Co-Pay:  DME:      Co-Pay:   Medicaid Application Date:       Case Manager:  Disability Application Date:       Case Worker:   Emergency Contact Information        Contact Information    Name Relation Home Work Mobile   Patricia,Montgomery Sister [phone]     Lyn Records [phone]       Current Medical History  Patient Admitting Diagnosis: Lumbar radiculopathy s/p lamy and discectomy   History  of Present Illness: Julia F. Mosbyis a 76 y.o.right handed femalewith history of diabetes mellitus, fibromyalgia,CKD  stage III creatinine 1.17,sickle cell trait, andback surgery 3.Per chart review patient lives alone and was using a cane prior to admission. 2 level home with bedroom first floor and 3 steps for entry. No local family that can assist.Presented 06/25/2016 with back pain radiating to the lower extremities. X-rays and imaging reviewed, showing disc herniation at L2-L3 with retrolisthesis and stenosis. Radiculopathy at that level. Underwent bilateral laminotomies and discectomy L2-3 with decompression of central canal L2 and L3 nerve roots. Posterior lumbar interbody arthrodesis using peek spacers pedicle screw fixation L3-L5 06/25/2016 per Dr. Ellene Route. Hospital course pain management. Back brace when out of bed.Acute blood loss anemia at 9.6. Hypokalemia 3.2. Mild elevation creatinine 1.17 from baseline 1.03. Physical and occupational therapy evaluations pending. M.D. has requested physical medicine rehabilitation consult.Patient was admitted for a comprehensive rehabilitation program 06/27/16.   Past Medical History      Past Medical History:  Diagnosis Date  . Anemia    has sickle cell trait  . Anxiety   . Arthritis   . Asthma    has used inhaler in past for asthmatic bronchitis, last time- early 2012  . Complication of anesthesia    wakes up shaking  . Diabetes mellitus   . Dyslipidemia   . Fibromyalgia   . GERD (gastroesophageal reflux disease)    occas. use of  Prilosec  . Heart murmur    sees Dr. Montez Morita, last seen- early 2012  . Hypertension    02/2010- stress test /w PCP  . Hypothyroidism   . Neuromuscular disorder (HCC)    lumbar radiculopathy, lumbago  . Nocturnal leg cramps 09/27/2014  . Pneumonia   . Sickle cell trait ([place])   . Sleep apnea    borderline sleep apnea, states she no longer uses, early 2012- stopped using      Family History  family history includes Breast cancer in her paternal aunt; Cancer - Prostate in her father; Multiple myeloma in her paternal aunt; Ovarian cancer in her mother.  Prior Rehab/Hospitalizations:  Has the patient had major surgery during 100 days prior to admission? No  Current Medications   Current Facility-Administered Medications:  .  0.9 %  sodium chloride infusion, 250 mL, Intravenous, Continuous, Kristeen Miss, MD .  0.9 %  sodium chloride infusion, , Intravenous, Continuous, Kristeen Miss, MD, Stopped at 06/27/16 0406 .  acetaminophen (TYLENOL) tablet 650 mg, 650 mg, Oral, Q4H PRN **OR** acetaminophen (TYLENOL) suppository 650 mg, 650 mg, Rectal, Q4H PRN, Kristeen Miss, MD .  albuterol (PROVENTIL) (2.5 MG/3ML) 0.083% nebulizer solution 3 mL, 3 mL, Inhalation, TID PRN, Kristeen Miss, MD .  alum & mag hydroxide-simeth (MAALOX/MYLANTA) 200-200-20 MG/5ML suspension 30 mL, 30 mL, Oral, Q6H PRN, Kristeen Miss, MD .  baclofen (LIORESAL) tablet 10 mg, 10 mg, Oral, QHS, Kristeen Miss, MD, 10 mg at 06/26/16 2233 .  bisacodyl (DULCOLAX) suppository 10 mg, 10 mg, Rectal, Daily PRN, Kristeen Miss, MD .  calcium carbonate (TUMS - dosed in mg elemental calcium) chewable tablet 200 mg of elemental calcium, 1 tablet, Oral, BID PRN, Kristeen Miss, MD .  docusate sodium (COLACE) capsule 100 mg, 100 mg, Oral, BID, Kristeen Miss, MD, 100 mg at 06/27/16 0958 .  ezetimibe (ZETIA) tablet 10 mg, 10 mg, Oral, Daily, Kristeen Miss, MD, 10 mg at 06/27/16 0957 .  gabapentin (NEURONTIN) capsule 300 mg, 300 mg, Oral, TID, Kristeen Miss, MD, 300 mg at 06/27/16 0958 .  glimepiride (AMARYL) tablet 2 mg, 2 mg, Oral, Q breakfast, Kristeen Miss, MD, 2  mg at 06/27/16 0728 .  irbesartan (AVAPRO) tablet 300 mg, 300 mg, Oral, Daily, 300 mg at 06/27/16 0957 **AND** hydrochlorothiazide (MICROZIDE) capsule 12.5 mg, 12.5 mg, Oral, Daily, Kristeen Miss, MD, 12.5 mg at 06/27/16 0958 .  HYDROcodone-acetaminophen  (NORCO/VICODIN) 5-325 MG per tablet 1-2 tablet, 1-2 tablet, Oral, Q4H PRN, Kristeen Miss, MD, 2 tablet at 06/27/16 1213 .  HYDROmorphone (DILAUDID) injection 1 mg, 1 mg, Intravenous, Q2H PRN, Kristeen Miss, MD, 1 mg at 06/26/16 2033 .  insulin aspart (novoLOG) injection 0-5 Units, 0-5 Units, Subcutaneous, QHS, Ripudeep K Rai, MD .  insulin aspart (novoLOG) injection 0-9 Units, 0-9 Units, Subcutaneous, TID WC, Ripudeep K Rai, MD, 2 Units at 06/27/16 1213 .  insulin aspart (novoLOG) injection 6 Units, 6 Units, Subcutaneous, TID WC, Ripudeep K Rai, MD, 6 Units at 06/27/16 1212 .  insulin detemir (LEVEMIR) injection 12 Units, 12 Units, Subcutaneous, QHS, Ripudeep K Rai, MD, 12 Units at 06/26/16 2237 .  levothyroxine (SYNTHROID, LEVOTHROID) tablet 25 mcg, 25 mcg, Oral, QAC breakfast, Kristeen Miss, MD, 25 mcg at 06/27/16 [phone] .  loratadine (CLARITIN) tablet 10 mg, 10 mg, Oral, Daily, Kristeen Miss, MD, 10 mg at 06/27/16 0958 .  menthol-cetylpyridinium (CEPACOL) lozenge 3 mg, 1 lozenge, Oral, PRN **OR** phenol (CHLORASEPTIC) mouth spray 1 spray, 1 spray, Mouth/Throat, PRN, Kristeen Miss, MD .  mometasone-formoterol [facility]) 100-5 MCG/ACT inhaler 2 puff, 2 puff, Inhalation, BID, Kristeen Miss, MD, 2 puff at 06/27/16 [phone] .  ondansetron (ZOFRAN) tablet 4 mg, 4 mg, Oral, Q6H PRN **OR** ondansetron (ZOFRAN) injection 4 mg, 4 mg, Intravenous, Q6H PRN, Kristeen Miss, MD .  polyethylene glycol (MIRALAX / GLYCOLAX) packet 17 g, 17 g, Oral, Daily PRN, Kristeen Miss, MD, 17 g at 06/26/16 2320 .  pregabalin (LYRICA) capsule 50 mg, 50 mg, Oral, QHS PRN, Kristeen Miss, MD, 50 mg at 06/26/16 2234 .  senna (SENOKOT) tablet 8.6 mg, 1 tablet, Oral, BID, Kristeen Miss, MD, 8.6 mg at 06/27/16 0957 .  sodium chloride flush (NS) 0.9 % injection 3 mL, 3 mL, Intravenous, Q12H, Kristeen Miss, MD, 3 mL at 06/26/16 2242 .  sodium chloride flush (NS) 0.9 % injection 3 mL, 3 mL, Intravenous, PRN, Kristeen Miss, MD .  sodium phosphate (FLEET)  7-19 GM/118ML enema 1 enema, 1 enema, Rectal, Once PRN, Kristeen Miss, MD  Patients Current Diet: Diet Carb Modified Fluid consistency: Thin; Room service appropriate? Yes  Precautions / Restrictions Precautions Precautions: Fall, Back Precaution Booklet Issued: No Precaution Comments: Verbally reviewed precautions and pt able to recall 3/3. Restrictions Weight Bearing Restrictions: No   Has the patient had 2 or more falls or a fall with injury in the past year?No  Prior Activity Level Community (5-7x/wk): Prior to admission patient was an active fully independent woman, who had had a decline over the past month requiring use of a cane and less outings in a week.     Home Assistive Devices / Equipment Home Assistive Devices/Equipment: Eyeglasses, Kasandra Knudsen (specify quad or straight)  Prior Device Use: Indicate devices/aids used by the patient prior to current illness, exacerbation or injury? Walker [employer]  Prior Functional Level Prior Function Level of Independence: Independent  Self Care: Did the patient need help bathing, dressing, using the toilet or eating? Independent  Indoor Mobility: Did the patient need assistance with walking from room to room (with or without device)? Independent  Stairs: Did the patient need assistance with internal or external stairs (with or without device)? Independent  Functional Cognition: Did the patient  need help planning regular tasks such as shopping or remembering to take medications? Independent  Current Functional Level Cognition  Overall Cognitive Status: Within Functional Limits for tasks assessed Orientation Level: Oriented X4    Extremity Assessment (includes Sensation/Coordination)  Upper Extremity Assessment: Overall WFL for tasks assessed  Lower Extremity Assessment: Generalized weakness    ADLs  Overall ADL's : Needs assistance/impaired Eating/Feeding: Set up, Sitting Grooming: Oral care, Wash/dry face,  Brushing hair, Min guard, Standing Upper Body Bathing: Set up, Sitting Lower Body Bathing: Min guard, Sit to/from stand Upper Body Dressing : Set up, Sitting Lower Body Dressing: Min guard, Sit to/from stand Toilet Transfer: Ambulation, Min guard, RW Toileting- Water quality scientist and Hygiene: Min guard, Sit to/from stand Functional mobility during ADLs: Min guard, Rolling walker General ADL Comments: Educated pt on back precautions during ADL. Pt becoming emotional and overwhelmed during session but calmed with encouragement and reassurance from therapist and CNA. She did become agitated with therapist initially during session but was able to restore rapport with pt and end session on good terms.    Mobility  Overal bed mobility: Needs Assistance Bed Mobility: Rolling, Sidelying to Sit, Sit to Sidelying Rolling: Supervision Sidelying to sit: Min assist Sit to sidelying: Min assist General bed mobility comments: OOB in bathroom with CNA on arrival.    Transfers  Overall transfer level: Needs assistance Equipment used: Rolling walker (2 wheeled) Transfers: Sit to/from Stand Sit to Stand: Min guard General transfer comment: Min guard for safety. Pt highly determined to be independent.    Ambulation / Gait / Stairs / Wheelchair Mobility  Ambulation/Gait Ambulation/Gait assistance: Physicist, medical (Feet): 70 Feet Assistive device: Rolling walker (2 wheeled) Gait Pattern/deviations: Step-through pattern, Decreased stride length, Trunk flexed General Gait Details: Pt moving very slow and guarded. Gross min guard assist provided for safety.  Gait velocity: Decreased Gait velocity interpretation: Below normal speed for age/gender    Posture / Balance Balance Overall balance assessment: Needs assistance Sitting-balance support: Feet supported, No upper extremity supported Sitting balance-Leahy Scale: Fair Standing balance support: No upper extremity supported,  During functional activity Standing balance-Leahy Scale: Fair    Special needs/care consideration BiPAP/CPAP: No CPM: No Continuous Drip IV: No Dialysis: No        Life Vest: No Oxygen: No Special Bed: No Trach Size: No Wound Vac (area): No       Skin: Dry all over; Surgical incision back                               Bowel mgmt: 06/24/16 Bladder mgmt: Continent, some urgency  Diabetic mgmt: HbA1c 8.1, sub-optimal control; reports she checked blood sugars and took oral medication and insulin prior to admission.     Previous Home Environment Living Arrangements: Alone Home Care Services: No  Discharge Living Setting Plans for Discharge Living Setting: Patient's home, Alone Type of Home at Discharge: House Discharge Home Layout: Two level, Able to live on main level with bedroom/bathroom Alternate Level Stairs-Rails: Can reach both Alternate Level Stairs-Number of Steps: 15 Discharge Home Access: Stairs to enter Entrance Stairs-Rails: Right Entrance Stairs-Number of Steps: 3 Discharge Bathroom Shower/Tub: Walk-in shower (with bench ) Discharge Bathroom Toilet: Standard (with adaptive equippment to elevate) Discharge Bathroom Accessibility: No Does the patient have any problems obtaining your medications?: Yes (Describe) (pt reports they are so expensive that she cannot afford them)  Social/Family/Support Systems Patient Roles: Psychologist, occupational, Parent, Other (Comment) (  grandparent, friend ) Discharge Plan Discussed with Primary Caregiver:  (discussed with patient ) Is Caregiver In Agreement with Plan?:  (patient in agreement with plan ) Does Caregiver/Family have Issues with Lodging/Transportation while Pt is in Rehab?: No  Goals/Additional Needs Patient/Family Goal for Rehab: PT/OT Mod I  Expected length of stay: 8-12 days  Cultural Considerations: Retired [place] Needs: Carb. Mod. Equipment Needs: TBD Special Service Needs:  None Additional Information: None Pt/Family Agrees to Admission and willing to participate: Yes Program Orientation Provided & Reviewed with Pt/Caregiver Including Roles  & Responsibilities: Yes Additional Information Needs: Pt. reports that she does better when she is encouraged versus being ordered to do things; she wants to be fully independent again  Information Needs to be Provided By: Team    Decrease burden of Care through IP rehab admission: No  Possible need for SNF placement upon discharge: Not anticipated   Patient Condition: This patient's condition remains as documented in the consult dated 06/26/16, in which the Rehabilitation Physician determined and documented that the patient's condition is appropriate for intensive rehabilitative care in an inpatient rehabilitation facility and is in agreement. Will admit to inpatient rehab today.  Preadmission Screen Completed By:  Gunnar Fusi, 06/27/2016 1:49 PM ______________________________________________________________________   Discussed status with Dr. Letta Pate on 06/27/16 at 1400 and received telephone approval for admission today.  Admission Coordinator:  Gunnar Fusi, time 1400/Date 06/27/16       Cosigned by: Charlett Blake, MD at 06/27/2016 2:09 PM  Revision History

## 2016-06-28 NOTE — Progress Notes (Signed)
Occupational Therapy Session Note  Patient Details  Name: [name] MRN: [id number] Date of Birth: 09/01/1940  Today's Date: 06/28/2016 OT Individual Time: 1030-1100 OT Individual Time Calculation (min): 30 min    Short Term Goals: Week 1:    STG=LTG due to LOS  Skilled Therapeutic Interventions/Progress Updates:    Pt seen for OT session focusing on ADL re-training and functional activity tolerance/ ambulation. Pt sitting in recliner upon arrival, agreeable to tx session. She ambulated throughout session with close supervision and transfers via stand pivot using RW.  She ambulated to sink and completed grooming tasks in standing.  She then ambulated to therapy gym, VCs provided throughout for less reliance on UE during ambulation. Tennis balls placed on backside of RW for control. She ambulated back to room and requested attempt for BM. Pt left seated on toilet at end of session, instructed to use of pull call bell and RN made aware of pt's position.   Therapy Documentation Precautions:  Precautions Precautions: Fall, Back Precaution Comments: recalled 3/3 precautions with no cuing Restrictions Weight Bearing Restrictions: No Pain: Pain Assessment Pain Assessment: 0-10 Pain Score: 8  Pain Type: Acute pain Pain Location: Back Pain Orientation: Lower Pain Descriptors / Indicators: Aching Pain Onset: On-going Pain Intervention(s): RN  Aware, repositioned  See Function Navigator for Current Functional Status.   Therapy/Group: Individual Therapy  Lewis, Elye Harmsen C 06/28/2016, 11:45 AM

## 2016-06-29 ENCOUNTER — Inpatient Hospital Stay (HOSPITAL_COMMUNITY): Payer: Medicare Other | Admitting: Physical Therapy

## 2016-06-29 ENCOUNTER — Inpatient Hospital Stay (HOSPITAL_COMMUNITY): Payer: Medicare Other | Admitting: Occupational Therapy

## 2016-06-29 DIAGNOSIS — E1165 Type 2 diabetes mellitus with hyperglycemia: Secondary | ICD-10-CM

## 2016-06-29 DIAGNOSIS — Z794 Long term (current) use of insulin: Secondary | ICD-10-CM

## 2016-06-29 MED ORDER — INSULIN ASPART 100 UNIT/ML ~~LOC~~ SOLN: 0.0000 [IU] | Freq: Every day | SUBCUTANEOUS | Status: DC

## 2016-06-29 MED ORDER — INSULIN ASPART 100 UNIT/ML ~~LOC~~ SOLN
0.0000 [IU] | Freq: Three times a day (TID) | SUBCUTANEOUS | Status: DC
  Administered 2016-06-30: 3 [IU] via SUBCUTANEOUS
  Administered 2016-06-30: 1 [IU] via SUBCUTANEOUS
  Administered 2016-06-30: 7 [IU] via SUBCUTANEOUS
  Administered 2016-07-01 (×3): 1 [IU] via SUBCUTANEOUS
  Administered 2016-07-02: 2 [IU] via SUBCUTANEOUS
  Administered 2016-07-03: 1 [IU] via SUBCUTANEOUS

## 2016-06-29 MED ORDER — SACCHAROMYCES BOULARDII 250 MG PO CAPS
250.0000 mg | ORAL_CAPSULE | Freq: Two times a day (BID) | ORAL | Status: DC
  Administered 2016-06-29 – 2016-07-03 (×9): 250 mg via ORAL
  Filled 2016-06-29 (×9): qty 1

## 2016-06-29 NOTE — Progress Notes (Signed)
Physical Therapy Session Note  Patient Details  Name: [name] MRN: [id number] Date of Birth: 1941-03-20  Today's Date: 06/29/2016 PT Individual Time: 0730-0830 and 1415-1455 PT Individual Time Calculation (min): 60 min and 40 min (total 100 min)   Short Term Goals: Week 1:  PT Short Term Goal 1 (Week 1): =LTG due to estimated LOS  Skilled Therapeutic Interventions/Progress Updates: Tx 1: Pt received seated in recliner eating breakfast; c/o 7/10 pain in back pre-medicated before therapy, and agreeable to treatment. Pt reporting she did not sleep well last night and was up at 3am sitting on the side of the bed d/t pain and her leg falling asleep; frustrated by bed alarm and needing help scooting up in the bed. Pt ambulates to bathroom with rollator and S. Performs all clothing management and hygiene with distant S. Standing at sink pt performs grooming task with S for standing balance. Gait to gym with rollator and S x150'. Ascent/descent 3" and 6" stairs with B handrails and S, reciprocal pattern and slow speed. Gait short distances without AD during session with close S. Sit <>stand 2x10 reps without UE support and close S. Standing LE strengthening exercises 3x10-15 reps each including heel raises, hamstring curls and hip abduction. Gait to return to room with rollator and S. Remained seated in recliner at end of session, all needs in reach.   Tx 2: Pt received seated in recliner asleep; easily awoken and agreeable to treatment with encouragement. Gait to gym with rollator and S. Performed nustep x12 min with BUE/BLE for strengthening and aerobic endurance. Gait on unit on level floors with rollator and S; 3 trials of 150-200' per trial with seated rest breaks between trials. Remained seated in recliner at end of session, all needs in reach and friend present.      Therapy Documentation Precautions:  Precautions Precautions: Fall, Back Precaution Comments: recalled 3/3 precautions with  no cuing Restrictions Weight Bearing Restrictions: No Pain: Pain Assessment Pain Assessment: 0-10 Pain Score: 7  Pain Type: Acute pain;Surgical pain Pain Location: Back Pain Orientation: Lower;Mid Pain Descriptors / Indicators: Aching;Sore Pain Onset: On-going Pain Intervention(s): Other (Comment) (pre-medicated)   See Function Navigator for Current Functional Status.   Therapy/Group: Individual Therapy  Luberta Mutter 06/29/2016, 8:18 AM

## 2016-06-29 NOTE — Progress Notes (Signed)
[name]     PROGRESS NOTE    Subjective/Complaints: Per RN, CBGs in 200s but not populating in epic Has meal coverage but no SSI  ROS: pt denies nausea, vomiting, diarrhea, cough, shortness of breath or chest pain   Objective: Vital Signs: Blood pressure 116/73, pulse 85, temperature 98.4 F (36.9 C), temperature source Oral, resp. rate 18, SpO2 96 %. No results found.  Recent Labs  06/28/16 0817  WBC 8.7  HGB 10.3*  HCT 31.9*  PLT 236    Recent Labs  06/28/16 0817  NA 140  K 3.4*  CL 100*  GLUCOSE 133*  BUN 12  CREATININE 0.96  CALCIUM 9.3   CBG (last 3)   Recent Labs  06/27/16 1637 06/27/16 2029 06/28/16 1137  GLUCAP 153* 152* 157*    Wt Readings from Last 3 Encounters:  06/25/16 86.8 kg (191 lb 5.8 oz)  06/04/16 80.7 kg (178 lb)  05/18/16 79.4 kg (175 lb)    Physical Exam:  Constitutional: NAD.  HENT:  Head: Normocephalic.  Eyes: EOM are normal.  Neck: supple no jvd.  Cardiovascular: RRR.   Respiratory: CTA B, no wheezes or distress.  GI: Soft. Bowel sounds are normal. She exhibits no distension.  Skin. Clean and dry. Back incision clean. ?small area of serosang drainage at inferior aspect of incision Neurological: She is alertand oriented to person, place, and time.  Motor: LLE: 3 to 3+/5 HF, KE 4/5 L ADF, PF, 5/5 RLE 5/5 BUE DTRs symmetric Sensation intact to light touchin bilateral LE's Psych: pleasant and cooperative.   Assessment/Plan: 1. Functional and mobility deficits secondary to lumbar radiculopathy s/p laminectomy/discectomy/fusion which require 3+ hours per day of interdisciplinary therapy in a comprehensive inpatient rehab setting. Physiatrist is providing close team supervision and 24 hour management of active medical problems listed below. Physiatrist and rehab team continue to assess barriers to discharge/monitor patient progress toward functional and medical  goals.  Function:  Bathing Bathing position   Position: Shower  Bathing parts Body parts bathed by patient: Right arm, Left arm, Chest, Abdomen, Front perineal area, Buttocks, Right upper leg, Left upper leg, Right lower leg, Left lower leg, Back    Bathing assist Assist Level: Supervision or verbal cues (long handled sponge)      Upper Body Dressing/Undressing Upper body dressing   What is the patient wearing?: Pull over shirt/dress, Orthosis     Pull over shirt/dress - Perfomed by patient: Thread/unthread right sleeve, Thread/unthread left sleeve, Put head through opening, Pull shirt over trunk       Orthosis activity level: Performed by patient  Upper body assist Assist Level: Set up   Set up : To obtain clothing/put away  Lower Body Dressing/Undressing Lower body dressing   What is the patient wearing?: Pants, Underwear Underwear - Performed by patient: Thread/unthread right underwear leg, Thread/unthread left underwear leg, Pull underwear up/down   Pants- Performed by patient: Thread/unthread right pants leg, Thread/unthread left pants leg, Pull pants up/down                        Lower body assist Assist for lower body dressing: Supervision or verbal cues      Toileting Toileting   Toileting steps completed by patient: Adjust clothing prior to toileting, Performs perineal hygiene, Adjust clothing after toileting   Toileting Assistive Devices: Grab bar or rail  Toileting assist Assist level: Supervision or verbal cues   Transfers Chair/bed transfer  Chair/bed transfer method: Ambulatory Chair/bed transfer assist level: Supervision or verbal cues Chair/bed transfer assistive device: Armrests, Other (rollator)     Locomotion Ambulation     Max distance: 150 Assist level: Supervision or verbal cues   Wheelchair          Cognition Comprehension Comprehension assist level: Follows complex conversation/direction with no assist  Expression  Expression assist level: Expresses complex ideas: With no assist  Social Interaction Social Interaction assist level: Interacts appropriately with others with medication or extra time (anti-anxiety, antidepressant).  Problem Solving Problem solving assist level: Solves complex problems: Recognizes & self-corrects  Memory Memory assist level: Complete Independence: No helper    Medical Problem List and Plan: 1.  Decreased functional mobility secondary to Lumbar radiculopathy status post laminectomy discectomy with pedicle screw fixation 06/25/2016. Back brace when out of bed applied the sitting position. May ambulate to bathroom without brace. May remove brace to shower.  -begin CIR therapies PT, OT, SLP 2.  DVT Prophylaxis/Anticoagulation: SCDs. Check vascular study today 3. Pain Management: Baclofen 10 mg daily at bedtime, Neurontin 300 mg 3 times a day, hydrocodone as needed, Lyrica 50 mg daily at bedtime as needed  -encouraged her to work with RN/schedule to time hydrocodone before periods of activity 4. Mood: Provide emotional support 5. Neuropsych: This patient is capable of making decisions on her own behalf. 6. Skin/Wound Care: Routine skin checks  -continue dry dressing for back wound. Can shower with wound covered 7. Fluids/Electrolytes/Nutrition: encourage PO  -I personally reviewed the patient's labs today.   -replete K+ 8. Hypertension. Avapro 300 mg daily, HCTZ 12.5 mg daily. Monitor with increased mobility 9. Diabetes mellitus and peripheral neuropathy. Hemoglobin A1c 7.8. Levemir 12 units daily at bedtime, Amaryl 2 mg daily. Check blood sugars before meals and at bedtime. Diabetic teaching 3/9 am CBG 133- no change levimir order SSI 10. CKD stage III creatinine 1.17 baseline. 0.96 today 3/9  11. Asthma. Continue inhalers as directed. Chest clear at present 12. Sickle cell trait. 13. Constipation. Laxative assistance--sorbitol today if no bm by end of day 14. Hyperlipidemia.  Zetia10 mg daily 15. Hypothyroidism. Synthroid   LOS (Days) 2 A FACE TO FACE EVALUATION WAS PERFORMED  Charlett Blake, MD 06/29/2016 8:23 AM

## 2016-06-29 NOTE — Progress Notes (Signed)
Occupational Therapy Session Note  Patient Details  Name: KHRYSTAL JEANMARIE MRN: [id number] Date of Birth: 24-Jun-1940  Today's Date: 06/29/2016 OT Individual Time: 1130-1200 OT Individual Time Calculation (min): 30 min   Skilled Therapeutic Interventions/Progress Updates: Patient partipated in dynamic standing balance activities with fair balance.   She worked on bilateral activities to challenge balance and in crease endurance and activitiy tolerance in prep for being home alone after a short stay by sister.    Patient fatigued after about 3 minutes standing activites but agreed to resume after short seated rest breaks.  Call bell and phone in place at end of session     Therapy Documentation Precautions:  Precautions Precautions: Fall, Back Precaution Comments: recalled 3/3 precautions with no cuing Restrictions Weight Bearing Restrictions: No     Pain:denied See Function Navigator for Current Functional Status.   Therapy/Group: Individual Therapy  Alfredia Ferguson [facility] 06/29/2016, 9:26 PM

## 2016-06-29 NOTE — Progress Notes (Signed)
Occupational Therapy Session Note  Patient Details  Name: [name] MRN: [id number] Date of Birth: 07-16-1940  Today's Date: 06/29/2016 OT Individual Time: 1005-1102 OT Individual Time Calculation (min): 57 min    Short Term Goals: Week 1:  OT Short Term Goal 1 (Week 1): STGs = LTGs due to short LOS  Skilled Therapeutic Interventions/Progress Updates: Skilled OT session completed with focus on ADL retraining, adherence to back precautions, and standing balance. Pt ambulated from recliner to shower bench with rollator and close supervision. Max cues provided for walker safety. Back incision covered while she was on tub bench. She completed bathing with overall supervision, utilizing figure 4 position for washing LEs and LH sponge for back. Reacher used for retrieving dropped bathing items on floor of shower. Dressing was completed with supervision while on tub bench. She was able to don her orthosis with setup. Afterwards she completed toilet transfer/toileting with supervision. Hygiene completed with pt in partial stand, able to adhere to precautions with this method. She then ambulated back to recliner and was repositioned for comfort. She was left with all needs within reach at time of departure.      Therapy Documentation Precautions:  Precautions Precautions: Fall, Back Precaution Comments: recalled 3/3 precautions with no cuing Restrictions Weight Bearing Restrictions: No   Pain: Pt reported pain to be manageable during session with rest breaks  Pain Assessment Pain Assessment: 0-10 Pain Score: 7  Pain Type: Acute pain Pain Location: Back Pain Orientation: Lower;Left Pain Intervention(s): Medication (See eMAR) ADL: ADL ADL Comments: supervision with RW Exercises:   Other Treatments:    See Function Navigator for Current Functional Status.   Therapy/Group: Individual Therapy  [name] A Jamille Fisher 06/29/2016, 12:46 PM

## 2016-06-29 NOTE — Progress Notes (Signed)
RT came to do MDI - pt absent from room at this time. Pt unavailable

## 2016-06-30 NOTE — Progress Notes (Signed)
CBG 67 @ 1705. CBG 201 @ 1830

## 2016-06-30 NOTE — Progress Notes (Signed)
[place] PHYSICAL MEDICINE & REHABILITATION     PROGRESS NOTE    Subjective/Complaints: Chonic leg cramps at noc, no daytime pain, thighs and calves  ROS: pt denies nausea, vomiting, diarrhea, cough, shortness of breath or chest pain   Objective: Vital Signs: Blood pressure 130/68, pulse 83, temperature 99.1 F (37.3 C), temperature source Oral, resp. rate 18, SpO2 97 %. No results found.  Recent Labs  06/28/16 0817  WBC 8.7  HGB 10.3*  HCT 31.9*  PLT 236    Recent Labs  06/28/16 0817  NA 140  K 3.4*  CL 100*  GLUCOSE 133*  BUN 12  CREATININE 0.96  CALCIUM 9.3   CBG (last 3)   Recent Labs  06/27/16 1637 06/27/16 2029 06/28/16 1137  GLUCAP 153* 152* 157*    Wt Readings from Last 3 Encounters:  06/25/16 86.8 kg (191 lb 5.8 oz)  06/04/16 80.7 kg (178 lb)  05/18/16 79.4 kg (175 lb)    Physical Exam:  Constitutional: NAD.  HENT:  Head: Normocephalic.  Eyes: EOM are normal.  Neck: supple no jvd.  Cardiovascular: RRR.   Respiratory: CTA B, no wheezes or distress.  GI: Soft. Bowel sounds are normal. She exhibits no distension.  Skin. Clean and dry. Back incision clean. ?small area of serosang drainage at inferior aspect of incision Neurological: She is alertand oriented to person, place, and time.  Motor: LLE: 3 to 3+/5 HF, KE 4/5 L ADF, PF, 5/5 RLE 5/5 BUE DTRs symmetric Sensation intact to light touchin bilateral LE's Psych: pleasant and cooperative.  Calves non tender without swelling Assessment/Plan: 1. Functional and mobility deficits secondary to lumbar radiculopathy s/p laminectomy/discectomy/fusion which require 3+ hours per day of interdisciplinary therapy in a comprehensive inpatient rehab setting. Physiatrist is providing close team supervision and 24 hour management of active medical problems listed below. Physiatrist and rehab team continue to assess barriers to discharge/monitor patient progress toward functional and medical  goals.  Function:  Bathing Bathing position   Position: Shower  Bathing parts Body parts bathed by patient: Right arm, Left arm, Chest, Abdomen, Front perineal area, Buttocks, Right upper leg, Left upper leg, Right lower leg, Left lower leg, Back    Bathing assist Assist Level: Supervision or verbal cues      Upper Body Dressing/Undressing Upper body dressing   What is the patient wearing?: Pull over shirt/dress, Orthosis     Pull over shirt/dress - Perfomed by patient: Thread/unthread right sleeve, Thread/unthread left sleeve, Put head through opening, Pull shirt over trunk       Orthosis activity level: Performed by patient  Upper body assist Assist Level: Set up   Set up : To obtain clothing/put away  Lower Body Dressing/Undressing Lower body dressing   What is the patient wearing?: Pants, Underwear, Shoes Underwear - Performed by patient: Thread/unthread right underwear leg, Thread/unthread left underwear leg, Pull underwear up/down   Pants- Performed by patient: Thread/unthread right pants leg, Thread/unthread left pants leg, Pull pants up/down           Shoes - Performed by patient: Don/doff right shoe, Don/doff left shoe            Lower body assist Assist for lower body dressing: Supervision or verbal cues      Toileting Toileting   Toileting steps completed by patient: Adjust clothing prior to toileting, Performs perineal hygiene, Adjust clothing after toileting   Toileting Assistive Devices: Grab bar or rail  Toileting assist Assist level: Supervision or verbal  cues   Transfers Chair/bed transfer   Chair/bed transfer method: Ambulatory Chair/bed transfer assist level: Supervision or verbal cues Chair/bed transfer assistive device: Armrests, Other (rollator)     Locomotion Ambulation     Max distance: 150 Assist level: Supervision or verbal cues   Wheelchair          Cognition Comprehension Comprehension assist level: Follows complex  conversation/direction with no assist  Expression Expression assist level: Expresses complex ideas: With no assist  Social Interaction Social Interaction assist level: Interacts appropriately with others with medication or extra time (anti-anxiety, antidepressant).  Problem Solving Problem solving assist level: Solves complex problems: Recognizes & self-corrects  Memory Memory assist level: Complete Independence: No helper    Medical Problem List and Plan: 1.  Decreased functional mobility secondary to Lumbar radiculopathy status post laminectomy discectomy with pedicle screw fixation 06/25/2016. Back brace when out of bed applied the sitting position. May ambulate to bathroom without brace. May remove brace to shower.  -begin CIR therapies PT, OT, SLP 2.  DVT Prophylaxis/Anticoagulation: SCDs.  vascular study Negative 3. Pain Management: Baclofen 10 mg daily at bedtime, Neurontin 300 mg 3 times a day, hydrocodone as needed, Lyrica 50 mg daily at bedtime as needed  -encouraged her to work with RN/schedule to time hydrocodone before periods of activity 4. Mood: Provide emotional support 5. Neuropsych: This patient is capable of making decisions on her own behalf. 6. Skin/Wound Care: Routine skin checks  -continue dry dressing for back wound. Can shower with wound covered 7. Fluids/Electrolytes/Nutrition: encourage PO    -replete K+ 8. Hypertension. Avapro 300 mg daily, HCTZ 12.5 mg daily. Monitor with increased mobility 9. Diabetes mellitus and peripheral neuropathy. Hemoglobin A1c 7.8. Levemir 12 units daily at bedtime, Amaryl 2 mg daily. Check blood sugars before meals and at bedtime. Diabetic teaching- Lyrica- will increase and schedule  CBG (last 3)   Recent Labs  06/27/16 1637 06/27/16 2029 06/28/16 1137  GLUCAP 153* 152* 157*    10. CKD stage III creatinine 1.17 baseline. 0.96 today 3/9  11. Asthma. Continue inhalers as directed. Chest clear at present 12. Sickle cell  trait. 13. Constipation. Laxative assistance--sorbitol today if no bm by end of day 14. Hyperlipidemia. Zetia10 mg daily 15. Hypothyroidism. Synthroid 16,  LE "cramps " mainly at noc no tenderness, may be DM PN increase lyrica  LOS (Days) 3 A FACE TO FACE EVALUATION WAS PERFORMED  Charlett Blake, MD 06/30/2016 7:53 AM

## 2016-06-30 NOTE — Progress Notes (Signed)
Complained of increased burning pain to back-PRN lyrica and scheduled neurontin given at HS. Patrici Ranks A

## 2016-07-01 ENCOUNTER — Inpatient Hospital Stay (HOSPITAL_COMMUNITY): Payer: Medicare Other | Admitting: Occupational Therapy

## 2016-07-01 ENCOUNTER — Inpatient Hospital Stay (HOSPITAL_COMMUNITY): Payer: Medicare Other | Admitting: Physical Therapy

## 2016-07-01 DIAGNOSIS — E1142 Type 2 diabetes mellitus with diabetic polyneuropathy: Secondary | ICD-10-CM

## 2016-07-01 LAB — GLUCOSE, CAPILLARY
GLUCOSE-CAPILLARY: 193 mg/dL — AB (ref 65–99)
GLUCOSE-CAPILLARY: 208 mg/dL — AB (ref 65–99)
GLUCOSE-CAPILLARY: 131 mg/dL — AB (ref 65–99)
GLUCOSE-CAPILLARY: 67 mg/dL (ref 65–99)
GLUCOSE-CAPILLARY: 201 mg/dL — AB (ref 65–99)
GLUCOSE-CAPILLARY: 105 mg/dL — AB (ref 65–99)
GLUCOSE-CAPILLARY: 146 mg/dL — AB (ref 65–99)
Glucose-Capillary: 156 mg/dL — ABNORMAL HIGH (ref 65–99)
Glucose-Capillary: 120 mg/dL — ABNORMAL HIGH (ref 65–99)
Glucose-Capillary: 105 mg/dL — ABNORMAL HIGH (ref 65–99)
Glucose-Capillary: 116 mg/dL — ABNORMAL HIGH (ref 65–99)
Glucose-Capillary: 314 mg/dL — ABNORMAL HIGH (ref 65–99)
Glucose-Capillary: 139 mg/dL — ABNORMAL HIGH (ref 65–99)
Glucose-Capillary: 149 mg/dL — ABNORMAL HIGH (ref 65–99)

## 2016-07-01 MED ORDER — TRAZODONE HCL 50 MG PO TABS
50.0000 mg | ORAL_TABLET | Freq: Every evening | ORAL | Status: DC | PRN
  Administered 2016-07-02: 50 mg via ORAL
  Filled 2016-07-01: qty 1

## 2016-07-01 MED ORDER — SENNOSIDES-DOCUSATE SODIUM 8.6-50 MG PO TABS
1.0000 | ORAL_TABLET | Freq: Two times a day (BID) | ORAL | Status: DC
  Administered 2016-07-01 – 2016-07-02 (×4): 1 via ORAL
  Filled 2016-07-01 (×5): qty 1

## 2016-07-01 NOTE — Progress Notes (Signed)
Occupational Therapy Session Note  Patient Details  Name: [name] MRN: [id number] Date of Birth: 18-Mar-1941  Today's Date: 07/01/2016 OT Individual Time: 0945-1100 and 1530-1600 OT Individual Time Calculation (min): 75 min and 30 min   Short Term Goals: Week 1:  OT Short Term Goal 1 (Week 1): STGs = LTGs due to short LOS  Skilled Therapeutic Interventions/Progress Updates:    Session One: Pt seen for OT session focusing on IADL re-training. Pt sitting in recliner upon arrival, complaints of pain and with encouragement pt agreeable to asking for pain medication- voiced pain decreased as session progressed following medications.  She ambulated throughout session with rollator and supervision. She completed laundry task utilizing top end and front end washer/ dryer as simulation of home environment. Occasional VCs provided for awareness of spinal pre-cautions and education regarding use of reacher to assist with obtaining items. Pt demonstrated ability to use "golf ball pick-up" technique to gather clothing items.  She had seated rest break on low soft surface couch. Discussed at length pt's daily routines and home responsibilities. She then ambulated to ADL kitchen where she completed simulated simple meal prep at stove level at mod I level alternating ambulating without AD and sitting on rollator.  She ambulated back to room at end of session, left seated in recliner. Reviewed need to ask for assist with mobility if needing to get to bathroom or get back into bed.    Session Two: Pt seen for OT session focusing on ADL/IADL tretraining and functional transfes. Pt received in bathroom changing clothes with hand off from PT. She completed dressing task mod I. She ambulated throughout room with supervision to gather personal items for laundry task. She ambulated throughout unit using rollator, VCs to increase ambulation speed to normal rate. She completed laundry task mod I, carrying over  education from morning session. She desired then to practice car transfer from driver's side. Completed transfer at supervision- mod I level with VCs for problem solving technique for increased ease. Pt very appreciative of ability to practice car transfer and felt better following transfer. Educated extensively regarding need for MD clearance prior to return to driving.  Pt returned to room and left seated in recliner, all needs in reach.   Therapy Documentation Precautions:  Precautions Precautions: Fall, Back Precaution Comments: recalled 3/3 precautions with no cuing Restrictions Weight Bearing Restrictions: No Pain: Pain Assessment Pain Assessment: 0-10 Pain Score: Asleep Pain Type: Acute pain;Neuropathic pain;Surgical pain Pain Location: Back Pain Descriptors / Indicators: Burning;Aching Pain Frequency: Intermittent Pain Onset: On-going Pain Intervention(s): Medication (See eMAR) Multiple Pain Sites: No ADL: ADL ADL Comments: supervision with RW  See Function Navigator for Current Functional Status.   Therapy/Group: Individual Therapy  Lewis, Teagan Ozawa C 07/01/2016, 7:13 AM

## 2016-07-01 NOTE — Progress Notes (Signed)
Complained of burning pain to back and legs. Offered PRN vicodin at HS, patient declined, reports feeling sleepy. At 0340, complained of burning pain to legs L >R, requesting PRN vicodin, reluctant to take, because next dose will be during therapy vs before. [name] A

## 2016-07-01 NOTE — Progress Notes (Signed)
Physical Therapy Session Note  Patient Details  Name: [name] MRN: [id number] Date of Birth: 1941-01-08  Today's Date: 07/01/2016 PT Individual Time: 1500-1530 PT Individual Time Calculation (min): 30 min   Short Term Goals: Week 1:  PT Short Term Goal 1 (Week 1): =LTG due to estimated LOS  Skilled Therapeutic Interventions/Progress Updates: Pt received supine in bed, c/o pain in back and agreeable to treatment. Supine>sit with S and bedrails. Gait in/out of bathroom with rollator and S; slow speed and antalgic gait pattern. Pt managed clothing and performed hygiene with modI. Ambulatory transfer to tub bench with rollator and S. Pt performed lower body bathing with setupA for clean clothing and reacher to don brief. Handoff to OT to complete dressing at end of session.      Therapy Documentation Precautions:  Precautions Precautions: Fall, Back Precaution Comments: recalled 3/3 precautions with no cuing Restrictions Weight Bearing Restrictions: No   See Function Navigator for Current Functional Status.   Therapy/Group: Individual Therapy  Luberta Mutter 07/01/2016, 3:38 PM

## 2016-07-01 NOTE — Progress Notes (Signed)
Physical Therapy Session Note  Patient Details  Name: [name] MRN: [id number] Date of Birth: 1941-02-07  Today's Date: 07/01/2016 PT Individual Time: 0800-0900 PT Individual Time Calculation (min): 60 min   Short Term Goals: Week 1:  PT Short Term Goal 1 (Week 1): =LTG due to estimated LOS  Skilled Therapeutic Interventions/Progress Updates:  Pt presented in recliner completing breakfast. Pt indicated did not sleep well overnight and currently having difficulty managing pain Nsg presented to administer meds. Pt indicated unaware of expectation of amount of therapy received on Sat. Provided pt edu regarding amount of therapy received daily. Pt ambulated to rehab gym with rollator and supervision. Poor foot clearance and decreased gait speed noted. Performed NuStep x 15 min L3 maintaining for LE strengthening and conditioning avg 40-50 steps per min. Pt returned to room in same manner as previous with min cues for posture and returned to recliner. Pt remained in recliner with call bell within reach and all needs met.      Therapy Documentation Precautions:  Precautions Precautions: Fall, Back Precaution Comments: recalled 3/3 precautions with no cuing Restrictions Weight Bearing Restrictions: No General:   Vital Signs:   Pain:     See Function Navigator for Current Functional Status.   Therapy/Group: Individual Therapy  Harsimran Westman  Laberta Wilbon, PTA  07/01/2016, 4:02 PM

## 2016-07-01 NOTE — Progress Notes (Signed)
Social Work  Social Work Assessment and Plan  Patient Details  Name: [name] MRN: [id number] Date of Birth: 1940/05/28  Today's Date: 06/30/2016  Problem List:  Patient Active Problem List   Diagnosis Date Noted  . Lumbar radiculopathy 06/27/2016  . Hypothyroidism 06/26/2016  . Labile blood glucose   . Surgery, elective   . Post-operative pain   . Sickle cell trait ([place])   . Acute blood loss anemia   . History of back surgery   . AKI (acute kidney injury) ([place])   . Herniated nucleus pulposus, L2-3 06/25/2016  . Bilateral primary osteoarthritis of knee 03/26/2016  . Osteoarthritis, hand 03/26/2016  . Other insomnia 03/26/2016  . Memory disorder 02/16/2016  . Fibromyalgia 09/27/2014  . Nocturnal leg cramps 09/27/2014  . Neuropathy ([place]) 03/10/2014  . Allergic rhinitis 03/10/2014  . Osteoporosis 03/10/2014  . Spinal stenosis of lumbar region with radiculopathy 02/28/2014  . Type II or unspecified type diabetes mellitus without mention of complication, uncontrolled 07/08/2013  . Hypokalemia 11/02/2012  . Essential hypertension, benign 11/02/2012  . Diabetes mellitus with neuropathy ([place]) 10/29/2012  . Hyperlipidemia 10/29/2012  . Spondylolisthesis of lumbar region 03/19/2011  . Lumbar radicular pain 03/19/2011   Past Medical History:  Past Medical History:  Diagnosis Date  . Anemia    has sickle cell trait  . Anxiety   . Arthritis   . Asthma    has used inhaler in past for asthmatic bronchitis, last time- early 2012  . Complication of anesthesia    wakes up shaking  . Diabetes mellitus   . Dyslipidemia   . Fibromyalgia   . GERD (gastroesophageal reflux disease)    occas. use of  Prilosec  . Heart murmur    sees Dr. Montez Morita, last seen- early 2012  . Hypertension    02/2010- stress test /w PCP  . Hypothyroidism   . Neuromuscular disorder (HCC)    lumbar radiculopathy, lumbago  . Nocturnal leg cramps 09/27/2014  . Pneumonia   . Sickle cell trait ([place])    . Sleep apnea    borderline sleep apnea, states she no longer uses, early 2012- stopped using    Past Surgical History:  Past Surgical History:  Procedure Laterality Date  . ABDOMINAL HYSTERECTOMY    . adb.cyst     ovarian cyst  . BACK SURGERY     2012, 2015 (3 total)  . COLONOSCOPY    . EYE SURGERY     macular degeneration treatment - injections  . OVARIAN CYST SURGERY     Social History:  reports that she quit smoking about 17 years ago. She has never used smokeless tobacco. She reports that she drinks alcohol. She reports that she does not use drugs.  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 Anticipated Caregiver: intermittent support from sisters and neighbors Ability/Limitations of Caregiver: No one who is available to stay 24/7 Caregiver Availability: Intermittent Family Dynamics: Pt notes she is very close with her sisters and that they have been encouraging her to move to [facility] but she does not want to leave this area.  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   Abuse/Neglect Physical Abuse: Denies Verbal Abuse: Denies Sexual Abuse: Denies Exploitation of patient/patient's resources: Denies Self-Neglect: Denies  Emotional Status Pt's affect, behavior adn adjustment status: Pt pleasant and completes assessment interview without any difficulty.  Very direct with her answers but laughs  easily with other staff members and reports she is motivated "to work through this pain."  She denies any significant emotional distress beyond general frustration with her limitations and pain. Recent Psychosocial Issues: Daughter died 2 yrs ago. Pyschiatric History: None Substance Abuse History: None  Patient / Family Perceptions, Expectations & Goals Pt/Family understanding of illness & functional limitations: Pt with very good understanding of the particulars of her surgery and reports this is her 4th back surgery.  Good understanding of current functional limitations/ need for CIR.   Premorbid pt/family roles/activities: Pt was completely independent and remained very active with her community, neighborhood and church.  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 Anticipated changes in roles/activities/participation: little change anticipated as she has mod ind goals. Pt/family expectations/goals: "I just want to get this pain under control."  [employer]: None Premorbid Home Care/DME Agencies: Other (Comment) (sent to SNF after each prior 3 surgeries) Transportation available at discharge: yes  Discharge Planning Living Arrangements: Alone Support Systems: Other relatives, Friends/neighbors, Social worker community Type of Residence: Private residence Insurance Resources: [employer], Multimedia programmer (specify) ([employer]) Financial Resources: Radio broadcast assistant Screen Referred: No Living Expenses: Higher education careers adviser Management: Patient Does the patient have any problems obtaining your medications?: Yes (Describe) (affordability) Home Management: Pt Patient/Family Preliminary Plans: Pt plans to return to her own home with intermittent support from family and friends. Social Work Anticipated Follow Up Needs: HH/OP Expected length of stay: 5-7 days  Clinical Impression AAF here following her 4th back surgery and c/o pain as "never below a 7".  She lives alone  but has very good support from her neighbors, church members and two sisters (both living in [place]).  She denies any emotional distress beyond general frustration with her pain and limitations.  She is aware team has set mod ind goals and she plans to d/c home with intermittent support of friends.  She denies any concerns.  Will follow for support and d/c planning needs.  Marixa Mellott 06/30/2016, 4:29 PM

## 2016-07-01 NOTE — Progress Notes (Signed)
[facility] PHYSICAL MEDICINE & REHABILITATION     PROGRESS NOTE    Subjective/Complaints: Had some pain last night. Didn't sleep that well. Pain radiates from back into bilateral thighs  ROS: pt denies nausea, vomiting, diarrhea, cough, shortness of breath or chest pain  Objective: Vital Signs: Blood pressure 136/67, pulse 97, temperature 98.2 F (36.8 C), temperature source Oral, resp. rate 18, SpO2 97 %. No results found. No results for input(s): WBC, HGB, HCT, PLT in the last 72 hours. No results for input(s): NA, K, CL, GLUCOSE, BUN, CREATININE, CALCIUM in the last 72 hours.  Invalid input(s): CO CBG (last 3)   Recent Labs  06/28/16 1137  GLUCAP 157*    Wt Readings from Last 3 Encounters:  06/25/16 86.8 kg (191 lb 5.8 oz)  06/04/16 80.7 kg (178 lb)  05/18/16 79.4 kg (175 lb)    Physical Exam:  Constitutional: NAD.  HENT:  Head: Normocephalic.  Eyes: EOM are normal.  Neck: supple no jvd.  Cardiovascular: RRR.   Respiratory: CTA B  GI: Soft. Bowel sounds are normal. She exhibits no distension.  Skin. Clean and dry. Back incision clean and dry today Neurological: She is alertand oriented to person, place, and time.  Motor: LLE: 3 to 3+/5 HF, KE 4/5 L ADF, PF, 5/5 RLE 5/5 BUE DTRs symmetric Sensation intact bilateral LE Psych: pleasant and cooperative.    Assessment/Plan: 1. Functional and mobility deficits secondary to lumbar radiculopathy s/p laminectomy/discectomy/fusion which require 3+ hours per day of interdisciplinary therapy in a comprehensive inpatient rehab setting. Physiatrist is providing close team supervision and 24 hour management of active medical problems listed below. Physiatrist and rehab team continue to assess barriers to discharge/monitor patient progress toward functional and medical goals.  Function:  Bathing Bathing position   Position: Shower  Bathing parts Body parts bathed by patient: Right arm, Left arm, Chest, Abdomen, Front  perineal area, Buttocks, Right upper leg, Left upper leg, Right lower leg, Left lower leg, Back    Bathing assist Assist Level: Supervision or verbal cues      Upper Body Dressing/Undressing Upper body dressing   What is the patient wearing?: Pull over shirt/dress, Orthosis     Pull over shirt/dress - Perfomed by patient: Thread/unthread right sleeve, Thread/unthread left sleeve, Put head through opening, Pull shirt over trunk       Orthosis activity level: Performed by patient  Upper body assist Assist Level: Set up   Set up : To obtain clothing/put away  Lower Body Dressing/Undressing Lower body dressing   What is the patient wearing?: Pants, Underwear, Shoes Underwear - Performed by patient: Thread/unthread right underwear leg, Thread/unthread left underwear leg, Pull underwear up/down   Pants- Performed by patient: Thread/unthread right pants leg, Thread/unthread left pants leg, Pull pants up/down           Shoes - Performed by patient: Don/doff right shoe, Don/doff left shoe            Lower body assist Assist for lower body dressing: Supervision or verbal cues      Toileting Toileting   Toileting steps completed by patient: Adjust clothing prior to toileting, Performs perineal hygiene, Adjust clothing after toileting   Toileting Assistive Devices: Grab bar or rail  Toileting assist Assist level: Supervision or verbal cues, Touching or steadying assistance (Pt.75%)   Transfers Chair/bed transfer   Chair/bed transfer method: Ambulatory Chair/bed transfer assist level: Supervision or verbal cues Chair/bed transfer assistive device: Armrests, Other (rollator)  Locomotion Ambulation     Max distance: 150 Assist level: Supervision or verbal cues   Wheelchair          Cognition Comprehension Comprehension assist level: Follows complex conversation/direction with no assist  Expression Expression assist level: Expresses complex ideas: With no assist   Social Interaction Social Interaction assist level: Interacts appropriately with others - No medications needed.  Problem Solving Problem solving assist level: Solves complex problems: Recognizes & self-corrects  Memory Memory assist level: Complete Independence: No helper    Medical Problem List and Plan: 1.  Decreased functional mobility secondary to Lumbar radiculopathy status post laminectomy discectomy with pedicle screw fixation 06/25/2016. Back brace when out of bed applied the sitting position. May ambulate to bathroom without brace. May remove brace to shower.  -continue CIR therapies PT, OT, SLP   -making steady functional gains 2.  DVT Prophylaxis/Anticoagulation: SCDs.  vascular study Negative 3. Pain Management: Baclofen 10 mg daily at bedtime, Neurontin 300 mg 3 times a day, hydrocodone as needed, Lyrica 50 mg daily at bedtime as needed  -encouraged her to utilize medications as needed   -afraid to take pain medicaiton due constipation 4. Mood: Provide emotional support 5. Neuropsych: This patient is capable of making decisions on her own behalf. 6. Skin/Wound Care: Routine skin checks  -continue dry dressing for back wound. Can shower with wound covered 7. Fluids/Electrolytes/Nutrition: encourage PO    -replete K+ 8. Hypertension. Avapro 300 mg daily, HCTZ 12.5 mg daily. Monitor with increased mobility 9. Diabetes mellitus and peripheral neuropathy. Hemoglobin A1c 7.8. Levemir 12 units daily at bedtime, Amaryl 2 mg daily. Check blood sugars before meals and at bedtime. Diabetic teaching-   CBG (last 3)   Recent Labs  06/28/16 1137  GLUCAP 157*    10. CKD stage III creatinine 1.17 baseline. 0.96 today 3/9  11. Asthma. Continue inhalers as directed. Chest clear at present 12. Sickle cell trait. 13. Constipation. Laxative assistance--changed colace to senokot-s bid 14. Hyperlipidemia. Zetia10 mg daily 15. Hypothyroidism. Synthroid   LOS (Days) 4 A FACE TO FACE  EVALUATION WAS PERFORMED  Meredith Staggers, MD 07/01/2016 9:34 AM

## 2016-07-02 ENCOUNTER — Inpatient Hospital Stay (HOSPITAL_COMMUNITY): Payer: Medicare Other | Admitting: Physical Therapy

## 2016-07-02 ENCOUNTER — Inpatient Hospital Stay (HOSPITAL_COMMUNITY): Payer: Medicare Other | Admitting: Occupational Therapy

## 2016-07-02 LAB — GLUCOSE, CAPILLARY
GLUCOSE-CAPILLARY: 152 mg/dL — AB (ref 65–99)
GLUCOSE-CAPILLARY: 115 mg/dL — AB (ref 65–99)
Glucose-Capillary: 160 mg/dL — ABNORMAL HIGH (ref 65–99)
Glucose-Capillary: 94 mg/dL (ref 65–99)

## 2016-07-02 NOTE — Patient Care Conference (Signed)
Inpatient RehabilitationTeam Conference and Plan of Care Update Date: 07/02/2016   Time: 2:20 PM    Patient Name: [name]      Medical Record Number: [id number]  Date of Birth: 1940-11-15 Sex: Female         Room/Bed: 4W18C/4W18C-01 Payor Info: Payor: MEDICARE / Plan: MEDICARE PART A AND B / Product Type: *No Product type* /    Admitting Diagnosis: Lumbar Rad SP [place] and Discetomy  Admit Date/Time:  06/27/2016  6:19 PM Admission Comments: No comment available   Primary Diagnosis:  <principal problem not specified> Principal Problem: <principal problem not specified>  Patient Active Problem List   Diagnosis Date Noted  . Lumbar radiculopathy 06/27/2016  . Hypothyroidism 06/26/2016  . Labile blood glucose   . Surgery, elective   . Post-operative pain   . Sickle cell trait ([place])   . Acute blood loss anemia   . History of back surgery   . AKI (acute kidney injury) ([place])   . Herniated nucleus pulposus, L2-3 06/25/2016  . Bilateral primary osteoarthritis of knee 03/26/2016  . Osteoarthritis, hand 03/26/2016  . Other insomnia 03/26/2016  . Memory disorder 02/16/2016  . Fibromyalgia 09/27/2014  . Nocturnal leg cramps 09/27/2014  . Neuropathy ([place]) 03/10/2014  . Allergic rhinitis 03/10/2014  . Osteoporosis 03/10/2014  . Spinal stenosis of lumbar region with radiculopathy 02/28/2014  . Type II or unspecified type diabetes mellitus without mention of complication, uncontrolled 07/08/2013  . Hypokalemia 11/02/2012  . Essential hypertension, benign 11/02/2012  . Diabetes mellitus with neuropathy ([place]) 10/29/2012  . Hyperlipidemia 10/29/2012  . Spondylolisthesis of lumbar region 03/19/2011  . Lumbar radicular pain 03/19/2011    Expected Discharge Date: Expected Discharge Date: 07/03/16  Team Members Present: Physician leading conference: Dr. Alger Simons Social Worker Present: Lennart Pall, LCSW Nurse Present: Heather Roberts, RN PT Present: Canary Brim, Harriet Pho, PT OT Present: Napoleon Form, OT SLP Present: Gunnar Fusi, SLP     Current Status/Progress Goal Weekly Team Focus  Medical   lumbar radiculopathy s/p decompression and fusion. pain issues. constipation. wound healing. dm with inconsistent control  improve pain and mobility  bowels, pain, wound care, dm   Bowel/Bladder   Continent of bowel and bladder LBM 07-01-16  Remain continent of bowel and bladder, regular bowel pattern  Assist with toileting needs, Assess bowel pattern q shift and prn and treat as needed   Swallow/Nutrition/ Hydration             ADL's   Supervision- mod I overall  Mod I  d/c planning; ADL/ IADL re-training; activity tolerance   Mobility   modI bed mobility, transfers, gait with rollator  modI overall  activity tolerance, LE strengthening, dynamic standing balance, pt education   Communication             Safety/Cognition/ Behavioral Observations            Pain   pain 7out 10 with norco q4, K pad order (not available)  Pain 5 out 10  Assess pain q shift and prn and treat as need   Skin   back incision dermabond  no new skin issue/infection  Assess skin q shift and prn    Rehab Goals Patient on target to meet rehab goals: Yes *See Care Plan and progress notes for long and short-term goals.  Barriers to Discharge: back precautions, pain    Possible Resolutions to Barriers:  back brace, adaptive techniques, pain control    Discharge Planning/Teaching Needs:  Home alone with intermittent assist of friends/ neighbors.  NA   Team Discussion:  MD continuing to address pain management.  Reaching mod ind goals already.  Team feels ok for d/c tomorrow. No DME needs.  Follow up HHPT.  Revisions to Treatment Plan:  None   Continued Need for Acute Rehabilitation Level of Care: The patient requires daily medical management by a physician with specialized training in physical medicine and rehabilitation for the following conditions: Daily direction  of a multidisciplinary physical rehabilitation program to ensure safe treatment while eliciting the highest outcome that is of practical value to the patient.: Yes Daily medical management of patient stability for increased activity during participation in an intensive rehabilitation regime.: Yes Daily analysis of laboratory values and/or radiology reports with any subsequent need for medication adjustment of medical intervention for : Post surgical problems;Wound care problems;Diabetes problems  [name] 07/02/2016, 4:56 PM

## 2016-07-02 NOTE — Progress Notes (Signed)
Occupational Therapy Discharge Summary  Patient Details  Name: [name] MRN: [id number] Date of Birth: [birth year]   Patient has met 47 of 11 long term goals due to improved activity tolerance, improved balance, postural control and improved coordination.  Patient to discharge at overall Modified Independent level.  Patient lives alone, however, sister will be present during first weeks home to help pt re-adjust to home life and provided assistance as needed.  Pt voices feeling comfortable and confident with planned d/c home.    Recommendation:  Patient with no further OT needs.  Equipment: Pt has all needed DME  Reasons for discharge: treatment goals met and discharge from hospital  Patient/family agrees with progress made and goals achieved: Yes  OT Discharge Precautions/Restrictions  Precautions Precautions: Fall;Back Precaution Comments: Able to follow functionally without cuing Restrictions Weight Bearing Restrictions: No ADL ADL ADL Comments: supervision with RW Vision/Perception  Vision- History Baseline Vision/History: No visual deficits Patient Visual Report: No change from baseline Vision- Assessment Vision Assessment?: No apparent visual deficits  Cognition Overall Cognitive Status: Within Functional Limits for tasks assessed Arousal/Alertness: Awake/alert Orientation Level: Oriented X4 Memory: Appears intact Awareness: Appears intact Problem Solving: Appears intact Safety/Judgment: Appears intact Sensation Sensation Light Touch: Appears Intact Stereognosis: Appears Intact Hot/Cold: Appears Intact Proprioception: Appears Intact Coordination Gross Motor Movements are Fluid and Coordinated: Yes Fine Motor Movements are Fluid and Coordinated: Yes Motor  Motor Motor: Within Functional Limits Trunk/Postural Assessment  Cervical Assessment Cervical Assessment: Within Functional Limits Thoracic Assessment Thoracic Assessment: Within Functional  Limits Lumbar Assessment Lumbar Assessment: Exceptions to WFL (Back pre-cautions) Postural Control Postural Control: Within Functional Limits  Balance Balance Balance Assessed: Yes Dynamic Sitting Balance Dynamic Sitting - Level of Assistance: 6: Modified independent (Device/Increase time) Static Standing Balance Static Standing - Balance Support: During functional activity Static Standing - Level of Assistance: 6: Modified independent (Device/Increase time) Dynamic Standing Balance Dynamic Standing - Balance Support: During functional activity Dynamic Standing - Level of Assistance: 6: Modified independent (Device/Increase time) Extremity/Trunk Assessment RUE Assessment RUE Assessment: Within Functional Limits LUE Assessment LUE Assessment: Within Functional Limits   See Function Navigator for Current Functional Status.  Lewis, Westlyn Glaza C 07/02/2016, 8:57 AM

## 2016-07-02 NOTE — Progress Notes (Signed)
Occupational Therapy Session Note  Patient Details  Name: [name] MRN: [id number] Date of Birth: 11-28-1940  Today's Date: 07/02/2016 OT Individual Time: 9390-3009 OT Individual Time Calculation (min): 75 min    Short Term Goals: Week 1:  OT Short Term Goal 1 (Week 1): STGs = LTGs due to short LOS  Skilled Therapeutic Interventions/Progress Updates:    Pt seen for OT ADL bathing/dressing session. Pt in recliner upon arrival, agreeable to tx session. Pt very excited that pain seems to be better managed and ready to begin the day. She ambulated throughout room without use of AD to gather items in prep for shower task. She bathed seated on tub bench mod I.  She dressed in bathroom mod I, alternating sit <> stands and cleaned up bathroom using reacher as appropriate to maintain spinal pre-cautions.  Grooming completed standing at sink mod I. Pt left in room with all needs in reach. She was made Mod I in room in prep for d/c home alone.   Therapy Documentation Precautions:  Precautions Precautions: Fall, Back Precaution Comments: recalled 3/3 precautions with no cuing Restrictions Weight Bearing Restrictions: No ADL: ADL ADL Comments: supervision with RW  See Function Navigator for Current Functional Status.   Therapy/Group: Individual Therapy  Lewis, Finnean Cerami C 07/02/2016, 6:56 AM

## 2016-07-02 NOTE — Progress Notes (Signed)
Physical Therapy Session Note  Patient Details  Name: [name] MRN: [id number] Date of Birth: [birth year]  Today's Date: 07/02/2016 PT Individual Time: 1401-1430 PT Individual Time Calculation (min): 29 min   Skilled Therapeutic Interventions/Progress Updates:    Treatment initiated today with pt sitting up in wheelchair.  Pt requesting to work on bed mobility for home.  Pt states that she was given a leg lifter this morning.  Session today worked on improving confidence and efficiency with bed mobility and also at improving balance while making bed.  Pt ambulated with modified independence using rollator from her room to the apartment.  Pt performed supine to sit using log roll with leg lifter x 3.  Pt was able to do this with supervision and verbal cues while maintaining back precautions.  Upon returning to room, pt was able to make bed with increased time and no loss of balance.  Pt was left in care of next PT for appt and left seated in chair.  Pt reports 10/10 pain, however, was not due for meds until 1445.  PT involved in session following was notified of pt need for meds in 15 min.  Therapy Documentation Precautions:  Precautions Precautions: Fall, Back Precaution Comments: Able to follow functionally without cuing Restrictions Weight Bearing Restrictions: No   See function tab for functional status today.  Therapy/Group: Individual Therapy  [name] 07/02/2016, 3:19 PM

## 2016-07-02 NOTE — Discharge Summary (Signed)
Discharge summary job [id number]

## 2016-07-02 NOTE — Progress Notes (Signed)
Social Work Patient ID: [name], female   DOB: May 01, 1940, 76 y.o.   MRN: [id number]   Have reviewed team conference with pt and she is aware and agreeable with targeted d/c date of tomorrow 3/14.  We discussed recommendation for HHPT and she requests [facility] as they have followed her in the past.  She is arrange d/c transportation via church member.  Feeling ready to go.  Jaan Fischel, LCSW

## 2016-07-02 NOTE — Care Management Note (Signed)
[facility] Individual Statement of Services  Patient Name:  [name]  Date:  07/01/2016  Welcome to the [facility].  Our goal is to provide you with an individualized program based on your diagnosis and situation, designed to meet your specific needs.  With this comprehensive rehabilitation program, you will be expected to participate in at least 3 hours of rehabilitation therapies Monday-Friday, with modified therapy programming on the weekends.  Your rehabilitation program will include the following services:  Physical Therapy (PT), Occupational Therapy (OT), 24 hour per day rehabilitation nursing, Case Management (Social Worker), Rehabilitation Medicine, Nutrition Services and Pharmacy Services  Weekly team conferences will be held on Tuesdays to discuss your progress.  Your Social Worker will talk with you frequently to get your input and to update you on team discussions.  Team conferences with you and your family in attendance may also be held.  Expected length of stay: 5-7 days  Overall anticipated outcome: modified independent  Depending on your progress and recovery, your program may change. Your Social Worker will coordinate services and will keep you informed of any changes. Your Social Worker's name and contact numbers are listed  below.  The following services may also be recommended but are not provided by the [facility] will be made to provide these services after discharge if needed.  Arrangements include referral to agencies that provide these services.  Your insurance has been verified to be:  Medicare and [employer] Your primary doctor is:  Dr. Lysle Rubens  Pertinent information will be shared with your doctor and your insurance company.  Social Worker:  [place], [place] or ([phone]   Information discussed with and copy given to patient by: Lennart Pall, 07/01/2016, 9:26 AM

## 2016-07-02 NOTE — Progress Notes (Signed)
Occupational Therapy Session Note  Patient Details  Name: [name] MRN: [id number] Date of Birth: 10/04/1940  Today's Date: 07/02/2016 OT Individual Time: 1015-1100 OT Individual Time Calculation (min): 45 min    Short Term Goals: Week 1:  OT Short Term Goal 1 (Week 1): STGs = LTGs due to short LOS  Skilled Therapeutic Interventions/Progress Updates:    Pt received in room and she expressed concern about going home tomorrow as "no one has helped me find a solution for my hands cramping".  Pt asked if she spoke with the MD about her hands this am and she said "well I wasn't thinking about it". When asked about pt's history with her hands she stated she has had this problem with muscle cramping for years, has seen several specialists, is on baclofin, lyrica, neurontin.  She continues to be upset that "the hospital" is not solving this problem for her as she is "paying a lot of money to be here". Explained to pt that inpt rehab is focusing on acute issues after her back surgery, but pt not satisfied with that answer. Demonstrated with return demonstrating of nerve gliding stretches for the hands and calf/ hamstring stretches she can do with the leg lifter before she goes to bed or when ever she is having the cramps during the day.   Pt ambulated to toilet and toileted with mod I.  Ambulated to ADL apt with rollator to practice bed mobility skills.  Sit to sidelying to supine 4x to practice lifting L leg into bed. Pt initially having difficulty and used leg lifter to assist her.  Discussed pet care, but her dog is with a family member and they will continue taking care of the dog. Pt returned to the room with all needs met.    Therapy Documentation Precautions:  Precautions Precautions: Fall, Back Precaution Comments: Able to follow functionally without cuing Restrictions Weight Bearing Restrictions: No  Pain: mild c/o back pain, premedicated   ADL: ADL ADL Comments: supervision  with RW   See Function Navigator for Current Functional Status.   Therapy/Group: Individual Therapy  SAGUIER,JULIA 07/02/2016, 8:58 AM  

## 2016-07-02 NOTE — Progress Notes (Signed)
Physical Therapy Discharge Summary  Patient Details  Name: [name] MRN: [id number] Date of Birth: Dec 21, 1940  Today's Date: 07/02/2016 PT Individual Time: 1430-1530 PT Individual Time Calculation (min): 60 min    Patient has met 9 of 9 long term goals due to improved activity tolerance, improved balance, improved postural control, increased strength, decreased pain, ability to compensate for deficits and functional use of  right lower extremity and left lower extremity.  Patient to discharge at an ambulatory level Modified Independent.   Patient does not require physical/cognitive assist at discharge; however will have family/friends for intermittent assist at home as needed.  Reasons goals not met: all goals met  Recommendation:  Patient will benefit from ongoing skilled PT services in home health setting to continue to advance safe functional mobility, address ongoing impairments in activity tolerance, strength, coordination, and minimize fall risk.  Equipment: No equipment provided  Reasons for discharge: treatment goals met  Patient/family agrees with progress made and goals achieved: Yes  PT Discharge Precautions/Restrictions Precautions Precautions: Fall;Back Precaution Comments: Able to follow functionally without cuing Restrictions Weight Bearing Restrictions: No Vision/Perception    WFL Cognition Overall Cognitive Status: Within Functional Limits for tasks assessed Arousal/Alertness: Awake/alert Orientation Level: Oriented X4 Memory: Appears intact Awareness: Appears intact Problem Solving: Appears intact Safety/Judgment: Appears intact Sensation Sensation Light Touch: Appears Intact Proprioception: Appears Intact Coordination Gross Motor Movements are Fluid and Coordinated: Yes Heel Shin Test: [facility] Motor  Motor Motor: Within Functional Limits  Mobility Bed Mobility Bed Mobility: Supine to Sit;Sit to Supine Supine to Sit: 6: Modified independent  (Device/Increase time) Sit to Supine: 6: Modified independent (Device/Increase time) Transfers Transfers: Yes Sit to Stand: 6: Modified independent (Device/Increase time);With armrests Stand to Sit: 6: Modified independent (Device/Increase time) Stand Pivot Transfers: 6: Modified independent (Device/Increase time);With armrests Locomotion  Ambulation Ambulation: Yes Ambulation/Gait Assistance: 6: Modified independent (Device/Increase time) Ambulation Distance (Feet): 200 Feet Assistive device: Rollator Gait Gait: Yes Gait Pattern: Poor foot clearance - left;Poor foot clearance - right Stairs / Additional Locomotion Stairs: Yes Stairs Assistance: 6: Modified independent (Device/Increase time) Stair Management Technique: Two rails;Alternating pattern;Forwards Ramp: 6: Modified independent (Device) Curb: 6: Modified independent (Device/increase time) Product manager Mobility: No  Trunk/Postural Assessment  Cervical Assessment Cervical Assessment: Within Functional Limits Thoracic Assessment Thoracic Assessment: Within Functional Limits Lumbar Assessment Lumbar Assessment: Exceptions to [facility] (back precautions) Postural Control Postural Control: Within Functional Limits  Balance Balance Balance Assessed: Yes Standardized Balance Assessment Standardized Balance Assessment: Berg Balance Test;Timed Up and Go Test Berg Balance Test Sit to Stand: Able to stand without using hands and stabilize independently Standing Unsupported: Able to stand safely 2 minutes Sitting with Back Unsupported but Feet Supported on Floor or Stool: Able to sit safely and securely 2 minutes Stand to Sit: Sits safely with minimal use of hands Transfers: Able to transfer safely, minor use of hands Standing Unsupported with Eyes Closed: Able to stand 10 seconds safely Standing Ubsupported with Feet Together: Able to place feet together independently and stand for 1 minute with supervision From  Standing, Reach Forward with Outstretched Arm: Can reach forward >12 cm safely (5") From Standing Position, Pick up Object from Floor: Unable to try/needs assist to keep balance (NT d/t back precautions) From Standing Position, Turn to Look Behind Over each Shoulder: Looks behind one side only/other side shows less weight shift Turn 360 Degrees: Able to turn 360 degrees safely in 4 seconds or less Standing Unsupported, Alternately Place Feet on Step/Stool: Able to  stand independently and safely and complete 8 steps in 20 seconds Standing Unsupported, One Foot in Front: Able to plae foot ahead of the other independently and hold 30 seconds Standing on One Leg: Tries to lift leg/unable to hold 3 seconds but remains standing independently Total Score: 45 Timed Up and Go Test TUG: Normal TUG Normal TUG (seconds): 15 Dynamic Sitting Balance Dynamic Sitting - Level of Assistance: 6: Modified independent (Device/Increase time) Static Standing Balance Static Standing - Balance Support: During functional activity Static Standing - Level of Assistance: 6: Modified independent (Device/Increase time) Dynamic Standing Balance Dynamic Standing - Balance Support: During functional activity Dynamic Standing - Level of Assistance: 6: Modified independent (Device/Increase time) Extremity Assessment  RUE Assessment RUE Assessment: Within Functional Limits LUE Assessment LUE Assessment: Within Functional Limits RLE Assessment RLE Assessment: Within Functional Limits (4+/5 throughout) LLE Assessment LLE Assessment: Within Functional Limits (4+/5 throughout)  Skilled Therapeutic Intervention: Pt received seated in recliner with handoff from PT after previous session; c/o pain and requesting pain medication from RN who was present shortly after to administer. Assessed all mobility as described above with modI overall. Performed nustep x13 min with BUE/BLE for strengthening and aerobic endurance. Gait to return  to room and remained in restroom at end of session, modI in room. Pt with no further questions/concerns at this time regarding d/c home.   Benjiman Core Tygielski 07/02/2016, 2:59 PM

## 2016-07-02 NOTE — Discharge Summary (Signed)
NAMEPARTHENIA, [name]              ACCOUNT NO.:  [id number]  MEDICAL RECORD NO.:  [id number]  LOCATION:                                 FACILITY:  PHYSICIAN:  Meredith Staggers, M.D.DATE OF BIRTH:  15-Nov-1940  DATE OF ADMISSION:  06/27/2016 DATE OF DISCHARGE:  07/03/2016                              DISCHARGE SUMMARY   DISCHARGE DIAGNOSES: 1. Lumbar radiculopathy status post laminectomy, diskectomy with     pedicle screw fixation, June 25, 2016. 2. Sequential compression devices for deep venous thrombosis     prophylaxis. 3. Pain management. 4. Hypertension. 5. Diabetes mellitus. 6. Peripheral neuropathy. 7. Chronic kidney disease stage III. 8. Asthma. 9. Sickle-cell trait. 10.Constipation. 11.Hyperlipidemia. 12.Hypothyroidism. 13. Hypokalemia  HISTORY OF PRESENT ILLNESS:  This is a 76 year old right-handed female with history of fibromyalgia, CKD stage III, sickle cell trait, and back surgery x3.  She lives alone, used a cane prior to admission, two-level home.  Presented on June 25, 2016, with back pain radiating to lower extremities.  X-rays and imaging were reviewed showing disc herniation at L2-3 with retrolisthesis and stenosis.  There was radiculopathy at that level.  Underwent bilateral laminotomies, diskectomy, and decompression of central canal with posterior lumbar interbody arthrodesis, pedicle screw fixation at L3-5 June 25, 2016 per Dr. Ellene Route.  HOSPITAL COURSE PAIN MANAGEMENT:  Back brace when out of bed.  Acute blood loss anemia 9.6 and monitored.  Hypokalemia 3.2.  Physical and occupational therapy ongoing.  The patient was admitted for comprehensive rehab program.  PAST MEDICAL HISTORY:  See discharge diagnoses.  SOCIAL HISTORY:  Lives alone used a cane prior to admission.  Functional status upon admission to rehab services was min-to-mod assist for mobility as well as activities of daily living.  Min assist, sit to stand.  PHYSICAL EXAMINATION:   VITAL SIGNS:  Blood pressure 111/52, pulse 85, temperature 98, and respirations 16. GENERAL:  This is an alert female, in no acute distress. NECK:  Supple.  Nontender.  No JVD. CARDIAC:  Regular rate and rhythm.  No murmur. ABDOMEN:  Soft, nontender.  Good bowel sounds. LUNGS:  Clear to auscultation without wheeze. BACK:  Incision clean and dry.  REHABILITATION HOSPITAL COURSE:  The patient was admitted to inpatient rehab services with therapies initiated on a 3-hour daily basis, consisting of physical therapy, occupational therapy, and rehabilitation nursing.  The following issues were addressed during the patient's rehabilitation stay.  Pertaining to Mrs. [name]'s lumbar radiculopathy, she had undergone laminectomy and diskectomy on June 25, 2016 per Dr. Ellene Route.  Back brace when out of bed, surgical site healing nicely.  SCDs for DVT prophylaxis.  Venous Doppler studies negative.  Pain management with the use of baclofen 10 mg at bedtime.  Neurontin 300 mg 3 times daily.  Lyrica 50 mg daily at bedtime as needed.  Hydrocodone for breakthrough pain.  Blood pressure is well controlled.  No orthostatic changes.  She would follow up with her primary MD.  She had a history of diabetes mellitus, peripheral neuropathy.  Hemoglobin A1c 7.8.  She remained on Levemir insulin as well as Amaryl.  Blood sugars with fair control.  Again she would follow up with primary MD.  CKD stage III, latest creatinine 0.96.  Bouts of constipation resolved with laxative assistance.  She did have a history of hypothyroidism.  She remained on Synthroid.  The patient received weekly collaborative interdisciplinary team conferences to discuss estimated length of stay, family teaching, and any barriers to her discharge.  She was ambulating to the rehab GM with a rollator and supervision.  She could return back to her room in the same manner.  She could manage her clothing perform hygiene at modified independent  level.  Ambulating, transferred to tub bench with assisted device and again supervision.  Performed lower body bathing with set up for cleaning, clothing, and a reacher to don her brief. Overall, her strength and endurance greatly improved.  She was encouraged overall progress and planned discharge to home.  DISCHARGE MEDICATIONS:  Included baclofen 10 mg p.o. at bedtime, Zetia 10 mg p.o. daily, Neurontin 300 mg p.o. t.i.d., Amaryl 2 mg p.o. breakfast, HCTZ 12.5 mg p.o. daily, Levemir 12 units subcutaneous at bedtime, Avapro 300 mg p.o. daily, Synthroid 25 mcg p.o. daily, Claritin 10 mg p.o. daily, Dulera 2 puffs twice daily, potassium chloride 10 mEq daily, Florastor 250 mg p.o. b.i.d., Senokot S, one p.o. b.i.d., hydrocodone 5/325 mg 1 or 2 tablets every 4 hours as needed pain, Lyrica 50 mg p.o. at bedtime as needed, and NovoLog insulin 6 units t.i.d.  DIET:  Diabetic diet. Encourage fluid.  FOLLOWUP:  The patient would follow up with Dr. Alger Simons at the outpatient only as needed; Dr. Kristeen Miss neurosurgery, call for appointment; Dr. Wenda Low medical management.  SPECIAL INSTRUCTIONS:  Back brace when out of bed.     [name], P.A.   ______________________________ Meredith Staggers, M.D.    DA/MEDQ  D:  07/02/2016  T:  07/02/2016  Job:  [id number]  cc:   Earleen Newport, M.D. Meredith Staggers, M.D. Wenda Low, MD

## 2016-07-02 NOTE — Progress Notes (Signed)
[facility]ICINE & RE[facility]ABILITATION     PROGRESS NOTE    Subjective/Complaints: Feeling better. Bowels emptied. Using pain medication now which has helped better control pain---she sees the benefits of using it  ROS: pt denies nausea, vomiting, diarrhea, cough, shortness of breath or chest pain  Objective: Vital Signs: Blood pressure 122/73, pulse 87, temperature 98.2 F (36.8 C), temperature source Oral, resp. rate 18, SpO2 97 %. No results found. No results for input(s): WBC, HGB, HCT, PLT in the last 72 hours. No results for input(s): NA, K, CL, GLUCOSE, BUN, CREATININE, CALCIUM in the last 72 hours.  Invalid input(s): CO CBG (last 3)   Recent Labs  07/01/16 1647 07/01/16 2031 07/02/16 0647  GLUCAP 146* 152* 115*    Wt Readings from Last 3 Encounters:  06/25/16 86.8 kg (191 lb 5.8 oz)  06/04/16 80.7 kg (178 lb)  05/18/16 79.4 kg (175 lb)    Physical Exam:  Constitutional: NAD.  HENT:  Head: Normocephalic.  Eyes: EOM are normal.  Neck: supple no jvd.  Cardiovascular: RRR.   Respiratory: cta B  GI: Soft. NT, ND.  Skin. Clean and dry incision. Neurological: She is alertand oriented to person, place, and time.  Motor: LLE: 3+/5 HF, KE 4/5 L ADF, PF, 5/5 RLE 5/5 BUE DTRs symmetric. Sensation intact Psych: pleasant and cooperative.    Assessment/Plan: 1. Functional and mobility deficits secondary to lumbar radiculopathy s/p laminectomy/discectomy/fusion which require 3+ hours per day of interdisciplinary therapy in a comprehensive inpatient rehab setting. Physiatrist is providing close team supervision and 24 hour management of active medical problems listed below. Physiatrist and rehab team continue to assess barriers to discharge/monitor patient progress toward functional and medical goals.  Function:  Bathing Bathing position   Position: Shower  Bathing parts Body parts bathed by patient: Right arm, Left arm, Chest, Abdomen, Front perineal  area, Buttocks, Right upper leg, Left upper leg, Right lower leg, Left lower leg, Back    Bathing assist Assist Level: More than reasonable time      Upper Body Dressing/Undressing Upper body dressing   What is the patient wearing?: Pull over shirt/dress, Orthosis     Pull over shirt/dress - Perfomed by patient: Thread/unthread right sleeve, Thread/unthread left sleeve, Put head through opening, Pull shirt over trunk       Orthosis activity level: Performed by patient  Upper body assist Assist Level: More than reasonable time   Set up : To obtain clothing/put away  Lower Body Dressing/Undressing Lower body dressing   What is the patient wearing?: Pants, Underwear, Shoes Underwear - Performed by patient: Thread/unthread right underwear leg, Thread/unthread left underwear leg, Pull underwear up/down   Pants- Performed by patient: Thread/unthread right pants leg, Thread/unthread left pants leg, Pull pants up/down           Shoes - Performed by patient: Don/doff right shoe, Don/doff left shoe            Lower body assist Assist for lower body dressing: More than reasonable time      Toileting Toileting   Toileting steps completed by patient: Adjust clothing prior to toileting, Performs perineal hygiene, Adjust clothing after toileting   Toileting Assistive Devices: Grab bar or rail  Toileting assist Assist level: More than reasonable time   Transfers Chair/bed transfer   Chair/bed transfer method: Ambulatory Chair/bed transfer assist level: Supervision or verbal cues Chair/bed transfer assistive device: Armrests, Other (rollator)     Locomotion Ambulation     Max  distance: 150 Assist level: Supervision or verbal cues   Wheelchair          Cognition Comprehension Comprehension assist level: Follows complex conversation/direction with no assist  Expression Expression assist level: Expresses complex ideas: With no assist  Social Interaction Social Interaction  assist level: Interacts appropriately with others - No medications needed.  Problem Solving Problem solving assist level: Solves complex problems: Recognizes & self-corrects  Memory Memory assist level: Complete Independence: No helper    Medical Problem List and Plan: 1.  Decreased functional mobility secondary to Lumbar radiculopathy status post laminectomy discectomy with pedicle screw fixation 06/25/2016. Back brace when out of bed applied the sitting position. May ambulate to bathroom without brace. May remove brace to shower.  -continue CIR therapies PT, OT, SLP   -team conference today. Probably can go home tomorrow 2.  DVT Prophylaxis/Anticoagulation: SCDs.  vascular study Negative 3. Pain Management: Baclofen 10 mg daily at bedtime, Neurontin 300 mg 3 times a day, hydrocodone as needed, Lyrica 50 mg daily at bedtime as needed  -encouraged her to utilize medications as needed   -encouraged her to use pain medication when needed 4. Mood: Provide emotional support 5. Neuropsych: This patient is capable of making decisions on her own behalf. 6. Skin/Wound Care: Routine skin checks  -continue dry dressing for back wound. Can shower with wound covered 7. Fluids/Electrolytes/Nutrition: encourage PO    -replete K+ 8. Hypertension. Avapro 300 mg daily, HCTZ 12.5 mg daily. Monitor with increased mobility 9. Diabetes mellitus and peripheral neuropathy. Hemoglobin A1c 7.8. Levemir 12 units daily at bedtime, Amaryl 2 mg daily. -   CBG (last 3)   Recent Labs  07/01/16 1647 07/01/16 2031 07/02/16 0647  GLUCAP 146* 152* 115*   -fair control  10. CKD stage III creatinine 1.17 baseline. 0.96 most recently 11. Asthma. Continue inhalers as directed. Chest clear at present 12. Sickle cell trait. 13. Constipation.  senokot-s bid 14. Hyperlipidemia. Zetia10 mg daily 15. Hypothyroidism. Synthroid   LOS (Days) 5 A FACE TO FACE EVALUATION WAS PERFORMED  Meredith Staggers, MD 07/02/2016 10:55  AM

## 2016-07-03 LAB — GLUCOSE, CAPILLARY
GLUCOSE-CAPILLARY: 143 mg/dL — AB (ref 65–99)
GLUCOSE-CAPILLARY: 122 mg/dL — AB (ref 65–99)

## 2016-07-03 MED ORDER — POTASSIUM CHLORIDE CRYS ER 10 MEQ PO TBCR: 10.0000 meq | EXTENDED_RELEASE_TABLET | Freq: Every day | ORAL | 1 refills | Status: DC

## 2016-07-03 MED ORDER — POLYETHYLENE GLYCOL 3350 17 G PO PACK: 17.0000 g | PACK | Freq: Every day | ORAL | 0 refills | Status: DC | PRN

## 2016-07-03 MED ORDER — VALSARTAN-HYDROCHLOROTHIAZIDE 320-12.5 MG PO TABS: 1.0000 | ORAL_TABLET | Freq: Every day | ORAL | 0 refills | Status: DC

## 2016-07-03 MED ORDER — BACLOFEN 10 MG PO TABS
10.0000 mg | ORAL_TABLET | Freq: Every day | ORAL | 1 refills | Status: DC
Start: 2016-07-03 — End: 2016-07-19

## 2016-07-03 MED ORDER — LYRICA 50 MG PO CAPS: 50.0000 mg | ORAL_CAPSULE | Freq: Every evening | ORAL | 0 refills | Status: DC | PRN

## 2016-07-03 MED ORDER — INSULIN ASPART 100 UNIT/ML FLEXPEN: 6.0000 [IU] | PEN_INJECTOR | Freq: Three times a day (TID) | SUBCUTANEOUS | 11 refills | Status: DC

## 2016-07-03 MED ORDER — INSULIN ASPART 100 UNIT/ML ~~LOC~~ SOLN: 6.0000 [IU] | Freq: Three times a day (TID) | SUBCUTANEOUS | 11 refills | Status: DC

## 2016-07-03 MED ORDER — GABAPENTIN 300 MG PO CAPS: ORAL_CAPSULE | ORAL | 0 refills | Status: DC

## 2016-07-03 MED ORDER — SENNOSIDES-DOCUSATE SODIUM 8.6-50 MG PO TABS: 1.0000 | ORAL_TABLET | Freq: Two times a day (BID) | ORAL | Status: DC

## 2016-07-03 MED ORDER — INSULIN PEN NEEDLE 32G X 4 MM MISC: 0 refills | Status: DC

## 2016-07-03 MED ORDER — GLIMEPIRIDE 2 MG PO TABS: 2.0000 mg | ORAL_TABLET | Freq: Every day | ORAL | 1 refills | Status: DC

## 2016-07-03 MED ORDER — INSULIN DETEMIR 100 UNIT/ML ~~LOC~~ SOLN: 12.0000 [IU] | Freq: Every day | SUBCUTANEOUS | 11 refills | Status: DC

## 2016-07-03 MED ORDER — SACCHAROMYCES BOULARDII 250 MG PO CAPS: 250.0000 mg | ORAL_CAPSULE | Freq: Two times a day (BID) | ORAL | 0 refills | Status: DC

## 2016-07-03 MED ORDER — LEVOTHYROXINE SODIUM 25 MCG PO TABS: ORAL_TABLET | ORAL | 0 refills | Status: DC

## 2016-07-03 MED ORDER — HYDROCODONE-ACETAMINOPHEN 5-325 MG PO TABS: 1.0000 | ORAL_TABLET | ORAL | 0 refills | Status: DC | PRN

## 2016-07-03 MED ORDER — EZETIMIBE 10 MG PO TABS: ORAL_TABLET | ORAL | 0 refills | Status: DC

## 2016-07-03 MED ORDER — POTASSIUM CHLORIDE CRYS ER 10 MEQ PO TBCR
10.0000 meq | EXTENDED_RELEASE_TABLET | Freq: Every day | ORAL | Status: DC
  Administered 2016-07-03: 10 meq via ORAL
  Filled 2016-07-03: qty 1

## 2016-07-03 NOTE — Progress Notes (Signed)
[place]IC[facility] RE[facility]ILITATION     PROGRESS NOTE    Subjective/Complaints: With prompting from RN, patient discussed "cramping" in feet and hands. Apparently this has been going on for a long time before this admission. Appears to have been worked up by primary. Otherwise doing well.   ROS: pt denies nausea, vomiting, diarrhea, cough, shortness of breath or chest pain  Objective: Vital Signs: Blood pressure 123/78, pulse 76, temperature 97.5 F (36.4 C), temperature source Oral, resp. rate 19, SpO2 95 %. No results found. No results for input(s): WBC, HGB, HCT, PLT in the last 72 hours. No results for input(s): NA, K, CL, GLUCOSE, BUN, CREATININE, CALCIUM in the last 72 hours.  Invalid input(s): CO CBG (last 3)   Recent Labs  07/02/16 0647 07/02/16 1138 07/02/16 1635  GLUCAP 115* 160* 94    Wt Readings from Last 3 Encounters:  06/25/16 86.8 kg (191 lb 5.8 oz)  06/04/16 80.7 kg (178 lb)  05/18/16 79.4 kg (175 lb)    Physical Exam:  Constitutional: NAD.  HENT:  Head: Normocephalic.  Eyes: EOM are normal.  Neck: supple no jvd.  Cardiovascular: RRR.   Respiratory: cta B  GI: Soft. NT, ND.  Skin. Clean and dry incision. Neurological: She is alertand oriented to person, place, and time.  Motor: LLE: 4-/5 HF, KE 4/5 L ADF, PF, 5/5 RLE 5/5 BUE DTRs symmetric. Sensation intact No visible spasms or abnormalities in either hand Psych: pleasant and cooperative.    Assessment/Plan: 1. Functional and mobility deficits secondary to lumbar radiculopathy s/p laminectomy/discectomy/fusion which require 3+ hours per day of interdisciplinary therapy in a comprehensive inpatient rehab setting. Physiatrist is providing close team supervision and 24 hour management of active medical problems listed below. Physiatrist and rehab team continue to assess barriers to discharge/monitor patient progress toward functional and medical goals.  Function:  Bathing Bathing  position   Position: Shower  Bathing parts Body parts bathed by patient: Right arm, Left arm, Chest, Abdomen, Front perineal area, Buttocks, Right upper leg, Left upper leg, Right lower leg, Left lower leg, Back    Bathing assist Assist Level: More than reasonable time      Upper Body Dressing/Undressing Upper body dressing   What is the patient wearing?: Pull over shirt/dress, Orthosis     Pull over shirt/dress - Perfomed by patient: Thread/unthread right sleeve, Thread/unthread left sleeve, Put head through opening, Pull shirt over trunk       Orthosis activity level: Performed by patient  Upper body assist Assist Level: More than reasonable time   Set up : To obtain clothing/put away  Lower Body Dressing/Undressing Lower body dressing   What is the patient wearing?: Pants, Underwear, Shoes Underwear - Performed by patient: Thread/unthread right underwear leg, Thread/unthread left underwear leg, Pull underwear up/down   Pants- Performed by patient: Thread/unthread right pants leg, Thread/unthread left pants leg, Pull pants up/down           Shoes - Performed by patient: Don/doff right shoe, Don/doff left shoe            Lower body assist Assist for lower body dressing: More than reasonable time      Toileting Toileting   Toileting steps completed by patient: Adjust clothing prior to toileting, Performs perineal hygiene, Adjust clothing after toileting   Toileting Assistive Devices: Grab bar or rail  Toileting assist Assist level: More than reasonable time   Transfers Chair/bed transfer   Chair/bed transfer method: Ambulatory Chair/bed transfer  assist level: No Help, no cues, assistive device, takes more than a reasonable amount of time Chair/bed transfer assistive device: Other (rollator)     Locomotion Ambulation     Max distance: 200 Assist level: No help, No cues, assistive device, takes more than a reasonable amount of time   Wheelchair           Cognition Comprehension Comprehension assist level: Follows complex conversation/direction with no assist  Expression Expression assist level: Expresses complex ideas: With no assist  Social Interaction Social Interaction assist level: Interacts appropriately with others - No medications needed.  Problem Solving Problem solving assist level: Solves complex problems: Recognizes & self-corrects  Memory Memory assist level: Complete Independence: No helper    Medical Problem List and Plan: 1.  Decreased functional mobility secondary to Lumbar radiculopathy status post laminectomy discectomy with pedicle screw fixation 06/25/2016. Back brace when out of bed applied the sitting position. May ambulate to bathroom without brace. May remove brace to shower.  -continue CIR therapies PT, OT, SLP   -dc home today  -follow up with surgeon and primary. Does NOT need to see me in follow up 2.  DVT Prophylaxis/Anticoagulation: SCDs.  vascular study Negative 3. Pain Management: Baclofen 10 mg daily at bedtime, Neurontin 300 mg 3 times a day, hydrocodone as needed, Lyrica 50 mg daily at bedtime as needed  -regarding cramping. Discussed adequate fluid intake, as well as adequate K+, Ca++, and Mg++   -her k+ has already been low during this admission--send home on 45meq daily  -can use baclofen as needed for cramping in addition to scheduled dose ' -heat and stretching also would be helpful at bedtime 4. Mood: Provide emotional support 5. Neuropsych: This patient is capable of making decisions on her own behalf. 6. Skin/Wound Care: Routine skin checks  -continue dry dressing for back wound. Can shower with wound covered 7. Fluids/Electrolytes/Nutrition: encourage PO    -replete K+ 8. Hypertension. Avapro 300 mg daily, HCTZ 12.5 mg daily. Monitor with increased mobility 9. Diabetes mellitus and peripheral neuropathy. Hemoglobin A1c 7.8. Levemir 12 units daily at bedtime, Amaryl 2 mg daily. -   CBG (last 3)    Recent Labs  07/02/16 0647 07/02/16 1138 07/02/16 1635  GLUCAP 115* 160* 94   -fair control  10. CKD stage III creatinine 1.17 baseline. 0.96 most recently 11. Asthma. Continue inhalers as directed. Chest clear at present 12. Sickle cell trait. 13. Constipation.  senokot-s bid 14. Hyperlipidemia. Zetia10 mg daily 15. Hypothyroidism. Synthroid   LOS (Days) 6 A FACE TO FACE EVALUATION WAS PERFORMED  Meredith Staggers, MD 07/03/2016 9:14 AM

## 2016-07-03 NOTE — Progress Notes (Signed)
Social Work Discharge Note  The overall goal for the admission was met for:   Discharge location: Yes - returning to her home alone  Length of Stay: Yes - 6 days  Discharge activity level: Yes - modified independent  Home/community participation: Yes  Services provided included: MD, RD, PT, OT, RN, TR, Pharmacy and [place]: Medicare and Private Insurance: [employer]  Follow-up services arranged: Home Health: PT via [facility] care and Patient/Family has no preference for HH/DME agencies  Comments (or additional information):  Patient/Family verbalized understanding of follow-up arrangements: Yes  Individual responsible for coordination of the follow-up plan: pt  Confirmed correct DME delivered: NA - pt had all needed DME  [name]

## 2016-07-03 NOTE — Discharge Instructions (Signed)
Inpatient Rehab Discharge Instructions  [place] Discharge date and time: No discharge date for patient encounter.   Activities/Precautions/ Functional Status: Activity: Back brace when out of bed applied in sitting position Diet: diabetic diet Wound Care: keep wound clean and dry Functional status:  ___ No restrictions     ___ Walk up steps independently ___ 24/7 supervision/assistance   ___ Walk up steps with assistance ___ Intermittent supervision/assistance  ___ Bathe/dress independently ___ Walk with walker     _x__ Bathe/dress with assistance ___ Walk Independently    ___ Shower independently ___ Walk with assistance    ___ Shower with assistance ___ No alcohol     ___ Return to work/school ________     COMMUNITY REFERRALS UPON DISCHARGE:    Home Health:   PT                     Agency:  [facility] Phone: [phone]      Special Instructions:    My questions have been answered and I understand these instructions. I will adhere to these goals and the provided educational materials after my discharge from the hospital.  Patient/Caregiver Signature _______________________________ Date __________  Clinician Signature _______________________________________ Date __________  Please bring this form and your medication list with you to all your follow-up doctor's appointments.

## 2016-07-04 DIAGNOSIS — M797 Fibromyalgia: Secondary | ICD-10-CM | POA: Diagnosis not present

## 2016-07-04 DIAGNOSIS — F419 Anxiety disorder, unspecified: Secondary | ICD-10-CM | POA: Diagnosis not present

## 2016-07-04 DIAGNOSIS — E1142 Type 2 diabetes mellitus with diabetic polyneuropathy: Secondary | ICD-10-CM | POA: Diagnosis not present

## 2016-07-04 DIAGNOSIS — I129 Hypertensive chronic kidney disease with stage 1 through stage 4 chronic kidney disease, or unspecified chronic kidney disease: Secondary | ICD-10-CM | POA: Diagnosis not present

## 2016-07-04 DIAGNOSIS — E785 Hyperlipidemia, unspecified: Secondary | ICD-10-CM | POA: Diagnosis not present

## 2016-07-04 DIAGNOSIS — M1990 Unspecified osteoarthritis, unspecified site: Secondary | ICD-10-CM | POA: Diagnosis not present

## 2016-07-04 DIAGNOSIS — Z4789 Encounter for other orthopedic aftercare: Secondary | ICD-10-CM | POA: Diagnosis not present

## 2016-07-04 DIAGNOSIS — N183 Chronic kidney disease, stage 3 (moderate): Secondary | ICD-10-CM | POA: Diagnosis not present

## 2016-07-04 DIAGNOSIS — J45909 Unspecified asthma, uncomplicated: Secondary | ICD-10-CM | POA: Diagnosis not present

## 2016-07-04 DIAGNOSIS — E1122 Type 2 diabetes mellitus with diabetic chronic kidney disease: Secondary | ICD-10-CM | POA: Diagnosis not present

## 2016-07-04 DIAGNOSIS — Z794 Long term (current) use of insulin: Secondary | ICD-10-CM | POA: Diagnosis not present

## 2016-07-04 DIAGNOSIS — D649 Anemia, unspecified: Secondary | ICD-10-CM | POA: Diagnosis not present

## 2016-07-05 DIAGNOSIS — N183 Chronic kidney disease, stage 3 (moderate): Secondary | ICD-10-CM | POA: Diagnosis not present

## 2016-07-05 DIAGNOSIS — I129 Hypertensive chronic kidney disease with stage 1 through stage 4 chronic kidney disease, or unspecified chronic kidney disease: Secondary | ICD-10-CM | POA: Diagnosis not present

## 2016-07-05 DIAGNOSIS — E1142 Type 2 diabetes mellitus with diabetic polyneuropathy: Secondary | ICD-10-CM | POA: Diagnosis not present

## 2016-07-05 DIAGNOSIS — E1122 Type 2 diabetes mellitus with diabetic chronic kidney disease: Secondary | ICD-10-CM | POA: Diagnosis not present

## 2016-07-05 DIAGNOSIS — Z4789 Encounter for other orthopedic aftercare: Secondary | ICD-10-CM | POA: Diagnosis not present

## 2016-07-05 DIAGNOSIS — M797 Fibromyalgia: Secondary | ICD-10-CM | POA: Diagnosis not present

## 2016-07-09 DIAGNOSIS — Z4789 Encounter for other orthopedic aftercare: Secondary | ICD-10-CM | POA: Diagnosis not present

## 2016-07-09 DIAGNOSIS — N183 Chronic kidney disease, stage 3 (moderate): Secondary | ICD-10-CM | POA: Diagnosis not present

## 2016-07-09 DIAGNOSIS — I129 Hypertensive chronic kidney disease with stage 1 through stage 4 chronic kidney disease, or unspecified chronic kidney disease: Secondary | ICD-10-CM | POA: Diagnosis not present

## 2016-07-09 DIAGNOSIS — E1122 Type 2 diabetes mellitus with diabetic chronic kidney disease: Secondary | ICD-10-CM | POA: Diagnosis not present

## 2016-07-09 DIAGNOSIS — M797 Fibromyalgia: Secondary | ICD-10-CM | POA: Diagnosis not present

## 2016-07-09 DIAGNOSIS — E1142 Type 2 diabetes mellitus with diabetic polyneuropathy: Secondary | ICD-10-CM | POA: Diagnosis not present

## 2016-07-11 DIAGNOSIS — M48062 Spinal stenosis, lumbar region with neurogenic claudication: Secondary | ICD-10-CM | POA: Diagnosis not present

## 2016-07-11 NOTE — Discharge Summary (Signed)
Physician Discharge Summary  Patient ID: [name] MRN: [id number] DOB/AGE: 08-21-1940 76 y.o.  Admit date: 06/25/2016 Discharge date: 06/27/2016  Admission Diagnoses: Spondylosis with stenosis and herniated nucleus pulposus L2-3 status post decompression and fusion L3-L5  Discharge Diagnoses: Spondylosis and stenosis with herniated nucleus pulposus L2-3, status post decompression and fusion L3-L5, diabetes mellitus.  Principal Problem:   Herniated nucleus pulposus, L2-3 Active Problems:   Diabetes mellitus with neuropathy (HCC)   Hyperlipidemia   Hypokalemia   Essential hypertension, benign   Hypothyroidism   Labile blood glucose   Surgery, elective   Post-operative pain   Sickle cell trait (HCC)   Acute blood loss anemia   History of back surgery   AKI (acute kidney injury) [facility])   Discharged Condition: fair  Hospital Course: She was admitted to undergo surgical decompression at L2-3 with stabilization L3-L5 she tolerated this well. She does live alone and needed some acute rehabilitation become independent.  Consults: rehabilitation medicine  Significant Diagnostic Studies: None  Treatments: surgery: Physical therapy occupational therapy  Discharge Exam: Blood pressure (!) 107/52, pulse 87, temperature 98.1 F (36.7 C), temperature source Oral, resp. rate 16, height 5\' 6"  (1.676 m), weight 86.8 kg (191 lb 5.8 oz), SpO2 98 %. Incision is clean and dry. Station and gait is stable.  Disposition: 01-Home or Self Care  Discharge Instructions    Diet - low sodium heart healthy    Complete by:  As directed    Incentive spirometry RT    Complete by:  As directed    Increase activity slowly    Complete by:  As directed      Allergies as of 06/27/2016      Reactions   Betadine [povidone Iodine] Swelling   Reaction to betadine eye drops   Cymbalta [duloxetine Hcl] Other (See Comments)   Dizziness    Adhesive [tape] Rash   Lipitor [atorvastatin] Rash       Medication List    TAKE these medications   aspirin 81 MG EC tablet Take 81mg  by mouth once daily   CAL-MAG-ZINC PO Take 1 tablet by mouth daily.   calcium carbonate 750 MG chewable tablet Commonly known as:  TUMS EX Chew 1 tablet by mouth 2 (two) times daily as needed for heartburn (indigestion).   cetirizine 10 MG tablet Commonly known as:  ZYRTEC Take 10 mg by mouth daily.   diclofenac sodium 1 % Gel Commonly known as:  VOLTAREN Apply 4 g topically 4 (four) times daily. Voltaren Gel 3 grams to 3 large joints upto TID 3 TUBES with 3 refills What changed:  when to take this  reasons to take this  additional instructions   PROAIR RESPICLICK 841 (90 Base) MCG/ACT Aepb Generic drug:  Albuterol Sulfate Inhale 2 puffs into the lungs 3 (three) times daily as needed (shortness of breath).   SYMBICORT 80-4.5 MCG/ACT inhaler Generic drug:  budesonide-formoterol Inhale 2 puffs into the lungs 2 (two) times daily as needed (shortness of breath).   vitamin B-12 500 MCG tablet Commonly known as:  CYANOCOBALAMIN Take 500 mcg by mouth daily.   vitamin E 400 UNIT capsule Take 400 Units by mouth daily.        SignedEarleen Newport 07/11/2016, 8:43 AM

## 2016-07-12 DIAGNOSIS — Z4789 Encounter for other orthopedic aftercare: Secondary | ICD-10-CM | POA: Diagnosis not present

## 2016-07-12 DIAGNOSIS — E1142 Type 2 diabetes mellitus with diabetic polyneuropathy: Secondary | ICD-10-CM | POA: Diagnosis not present

## 2016-07-12 DIAGNOSIS — E1122 Type 2 diabetes mellitus with diabetic chronic kidney disease: Secondary | ICD-10-CM | POA: Diagnosis not present

## 2016-07-12 DIAGNOSIS — M797 Fibromyalgia: Secondary | ICD-10-CM | POA: Diagnosis not present

## 2016-07-12 DIAGNOSIS — I129 Hypertensive chronic kidney disease with stage 1 through stage 4 chronic kidney disease, or unspecified chronic kidney disease: Secondary | ICD-10-CM | POA: Diagnosis not present

## 2016-07-12 DIAGNOSIS — N183 Chronic kidney disease, stage 3 (moderate): Secondary | ICD-10-CM | POA: Diagnosis not present

## 2016-07-13 ENCOUNTER — Emergency Department (HOSPITAL_COMMUNITY): Payer: Medicare Other

## 2016-07-13 ENCOUNTER — Emergency Department (HOSPITAL_COMMUNITY)
Admission: EM | Admit: 2016-07-13 | Discharge: 2016-07-13 | Disposition: A | Discharge status: Home / Self Care | Payer: Medicare Other | Source: Home / Self Care | Attending: Emergency Medicine | Admitting: Emergency Medicine

## 2016-07-13 ENCOUNTER — Encounter (HOSPITAL_COMMUNITY): Payer: Self-pay

## 2016-07-13 DIAGNOSIS — E1140 Type 2 diabetes mellitus with diabetic neuropathy, unspecified: Secondary | ICD-10-CM | POA: Insufficient documentation

## 2016-07-13 DIAGNOSIS — M549 Dorsalgia, unspecified: Secondary | ICD-10-CM

## 2016-07-13 DIAGNOSIS — D573 Sickle-cell trait: Secondary | ICD-10-CM | POA: Diagnosis not present

## 2016-07-13 DIAGNOSIS — Z87891 Personal history of nicotine dependence: Secondary | ICD-10-CM

## 2016-07-13 DIAGNOSIS — G253 Myoclonus: Secondary | ICD-10-CM | POA: Diagnosis not present

## 2016-07-13 DIAGNOSIS — Z794 Long term (current) use of insulin: Secondary | ICD-10-CM | POA: Insufficient documentation

## 2016-07-13 DIAGNOSIS — T8131XA Disruption of external operation (surgical) wound, not elsewhere classified, initial encounter: Secondary | ICD-10-CM | POA: Diagnosis not present

## 2016-07-13 DIAGNOSIS — R410 Disorientation, unspecified: Secondary | ICD-10-CM | POA: Diagnosis not present

## 2016-07-13 DIAGNOSIS — R4182 Altered mental status, unspecified: Secondary | ICD-10-CM | POA: Diagnosis not present

## 2016-07-13 DIAGNOSIS — J45909 Unspecified asthma, uncomplicated: Secondary | ICD-10-CM

## 2016-07-13 DIAGNOSIS — Z7982 Long term (current) use of aspirin: Secondary | ICD-10-CM

## 2016-07-13 DIAGNOSIS — E039 Hypothyroidism, unspecified: Secondary | ICD-10-CM

## 2016-07-13 DIAGNOSIS — T8189XA Other complications of procedures, not elsewhere classified, initial encounter: Secondary | ICD-10-CM | POA: Diagnosis not present

## 2016-07-13 DIAGNOSIS — I10 Essential (primary) hypertension: Secondary | ICD-10-CM | POA: Diagnosis not present

## 2016-07-13 DIAGNOSIS — G92 Toxic encephalopathy: Secondary | ICD-10-CM | POA: Diagnosis not present

## 2016-07-13 DIAGNOSIS — I6789 Other cerebrovascular disease: Secondary | ICD-10-CM | POA: Diagnosis not present

## 2016-07-13 DIAGNOSIS — M545 Low back pain, unspecified: Secondary | ICD-10-CM

## 2016-07-13 DIAGNOSIS — E785 Hyperlipidemia, unspecified: Secondary | ICD-10-CM | POA: Diagnosis not present

## 2016-07-13 LAB — COMPREHENSIVE METABOLIC PANEL
ALT: 13 U/L — AB (ref 14–54)
ANION GAP: 9 (ref 5–15)
AST: 24 U/L (ref 15–41)
Albumin: 3.7 g/dL (ref 3.5–5.0)
Alkaline Phosphatase: 78 U/L (ref 38–126)
BUN: 20 mg/dL (ref 6–20)
CHLORIDE: 103 mmol/L (ref 101–111)
CO2: 30 mmol/L (ref 22–32)
CREATININE: 1.12 mg/dL — AB (ref 0.44–1.00)
Calcium: 9.5 mg/dL (ref 8.9–10.3)
GFR, EST AFRICAN AMERICAN: 54 mL/min — AB (ref >60)
GFR, EST NON AFRICAN AMERICAN: 46 mL/min — AB (ref >60)
Glucose, Bld: 197 mg/dL — ABNORMAL HIGH (ref 65–99)
Potassium: 4.5 mmol/L (ref 3.5–5.1)
Sodium: 142 mmol/L (ref 135–145)
Total Bilirubin: 0.6 mg/dL (ref 0.3–1.2)
Total Protein: 6.5 g/dL (ref 6.5–8.1)

## 2016-07-13 LAB — CBC WITH DIFFERENTIAL/PLATELET
BASOS PCT: 0 %
Basophils Absolute: 0 10*3/uL (ref 0.0–0.1)
EOS ABS: 0.1 10*3/uL (ref 0.0–0.7)
Eosinophils Relative: 1 %
HCT: 30.7 % — ABNORMAL LOW (ref 36.0–46.0)
Hemoglobin: 9.8 g/dL — ABNORMAL LOW (ref 12.0–15.0)
Lymphocytes Relative: 16 %
Lymphs Abs: 1.2 10*3/uL (ref 0.7–4.0)
MCH: 27.1 pg (ref 26.0–34.0)
MCHC: 31.9 g/dL (ref 30.0–36.0)
MCV: 84.8 fL (ref 78.0–100.0)
MONO ABS: 0.5 10*3/uL (ref 0.1–1.0)
MONOS PCT: 7 %
Neutro Abs: 5.4 10*3/uL (ref 1.7–7.7)
Neutrophils Relative %: 76 %
PLATELETS: 332 10*3/uL (ref 150–400)
RBC: 3.62 MIL/uL — ABNORMAL LOW (ref 3.87–5.11)
RDW: 15.2 % (ref 11.5–15.5)
WBC: 7.2 10*3/uL (ref 4.0–10.5)

## 2016-07-13 LAB — SEDIMENTATION RATE: SED RATE: 45 mm/h — AB (ref 0–22)

## 2016-07-13 MED ORDER — LORAZEPAM 2 MG/ML IJ SOLN
1.0000 mg | Freq: Once | INTRAMUSCULAR | Status: AC
  Administered 2016-07-13: 1 mg via INTRAVENOUS
  Filled 2016-07-13: qty 1

## 2016-07-13 MED ORDER — SODIUM CHLORIDE 0.9 % IV BOLUS (SEPSIS)
1000.0000 mL | Freq: Once | INTRAVENOUS | Status: AC
  Administered 2016-07-13: 1000 mL via INTRAVENOUS

## 2016-07-13 MED ORDER — OXYCODONE-ACETAMINOPHEN 5-325 MG PO TABS: 1.0000 | ORAL_TABLET | ORAL | 0 refills | Status: DC | PRN

## 2016-07-13 MED ORDER — GADOBENATE DIMEGLUMINE 529 MG/ML IV SOLN
15.0000 mL | Freq: Once | INTRAVENOUS | Status: AC | PRN
  Administered 2016-07-13: 15 mL via INTRAVENOUS

## 2016-07-13 MED ORDER — HYDROMORPHONE HCL 1 MG/ML IJ SOLN
1.0000 mg | Freq: Once | INTRAMUSCULAR | Status: AC
  Administered 2016-07-13: 1 mg via INTRAVENOUS
  Filled 2016-07-13: qty 1

## 2016-07-13 NOTE — ED Notes (Signed)
Pt back from MRI appears very groggy was able to arouse pt who stated that we could speak with her sister via phone regarding her condition
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
## 2016-07-13 NOTE — ED Notes (Signed)
Patient transported to MRI 

## 2016-07-13 NOTE — ED Notes (Signed)
ED Provider at bedside. 

## 2016-07-13 NOTE — ED Triage Notes (Signed)
Pt arrives appears confused about what medicine she took for her pain pt is c/o back pain

## 2016-07-13 NOTE — ED Notes (Signed)
In pt's room she appears to be sleeping as per ER MD pt ok to be discharged once she appears more awake and alert pt moved to hallway VS WNL

## 2016-07-14 ENCOUNTER — Emergency Department (HOSPITAL_COMMUNITY): Payer: Medicare Other

## 2016-07-14 ENCOUNTER — Inpatient Hospital Stay (HOSPITAL_COMMUNITY)
Admission: EM | Admit: 2016-07-14 | Discharge: 2016-07-19 | DRG: 093 | Disposition: A | Discharge status: Home Health | Payer: Medicare Other | Attending: Nephrology | Admitting: Nephrology

## 2016-07-14 ENCOUNTER — Encounter (HOSPITAL_COMMUNITY): Payer: Self-pay

## 2016-07-14 DIAGNOSIS — M797 Fibromyalgia: Secondary | ICD-10-CM | POA: Diagnosis present

## 2016-07-14 DIAGNOSIS — Y92009 Unspecified place in unspecified non-institutional (private) residence as the place of occurrence of the external cause: Secondary | ICD-10-CM

## 2016-07-14 DIAGNOSIS — R410 Disorientation, unspecified: Secondary | ICD-10-CM

## 2016-07-14 DIAGNOSIS — Z87891 Personal history of nicotine dependence: Secondary | ICD-10-CM

## 2016-07-14 DIAGNOSIS — R4182 Altered mental status, unspecified: Secondary | ICD-10-CM | POA: Diagnosis not present

## 2016-07-14 DIAGNOSIS — G9340 Encephalopathy, unspecified: Secondary | ICD-10-CM | POA: Diagnosis present

## 2016-07-14 DIAGNOSIS — K219 Gastro-esophageal reflux disease without esophagitis: Secondary | ICD-10-CM | POA: Diagnosis present

## 2016-07-14 DIAGNOSIS — Z91048 Other nonmedicinal substance allergy status: Secondary | ICD-10-CM

## 2016-07-14 DIAGNOSIS — Z7951 Long term (current) use of inhaled steroids: Secondary | ICD-10-CM

## 2016-07-14 DIAGNOSIS — J45909 Unspecified asthma, uncomplicated: Secondary | ICD-10-CM | POA: Diagnosis present

## 2016-07-14 DIAGNOSIS — Z803 Family history of malignant neoplasm of breast: Secondary | ICD-10-CM

## 2016-07-14 DIAGNOSIS — E876 Hypokalemia: Secondary | ICD-10-CM | POA: Diagnosis present

## 2016-07-14 DIAGNOSIS — Z7982 Long term (current) use of aspirin: Secondary | ICD-10-CM

## 2016-07-14 DIAGNOSIS — T50995A Adverse effect of other drugs, medicaments and biological substances, initial encounter: Secondary | ICD-10-CM | POA: Diagnosis present

## 2016-07-14 DIAGNOSIS — Z8042 Family history of malignant neoplasm of prostate: Secondary | ICD-10-CM

## 2016-07-14 DIAGNOSIS — G253 Myoclonus: Secondary | ICD-10-CM | POA: Diagnosis present

## 2016-07-14 DIAGNOSIS — E785 Hyperlipidemia, unspecified: Secondary | ICD-10-CM | POA: Diagnosis present

## 2016-07-14 DIAGNOSIS — Z888 Allergy status to other drugs, medicaments and biological substances status: Secondary | ICD-10-CM

## 2016-07-14 DIAGNOSIS — Z807 Family history of other malignant neoplasms of lymphoid, hematopoietic and related tissues: Secondary | ICD-10-CM

## 2016-07-14 DIAGNOSIS — E039 Hypothyroidism, unspecified: Secondary | ICD-10-CM | POA: Diagnosis present

## 2016-07-14 DIAGNOSIS — E1140 Type 2 diabetes mellitus with diabetic neuropathy, unspecified: Secondary | ICD-10-CM | POA: Diagnosis present

## 2016-07-14 DIAGNOSIS — D573 Sickle-cell trait: Secondary | ICD-10-CM | POA: Diagnosis present

## 2016-07-14 DIAGNOSIS — I10 Essential (primary) hypertension: Secondary | ICD-10-CM | POA: Diagnosis present

## 2016-07-14 DIAGNOSIS — Z8041 Family history of malignant neoplasm of ovary: Secondary | ICD-10-CM

## 2016-07-14 DIAGNOSIS — T402X5A Adverse effect of other opioids, initial encounter: Secondary | ICD-10-CM | POA: Diagnosis present

## 2016-07-14 DIAGNOSIS — Z883 Allergy status to other anti-infective agents status: Secondary | ICD-10-CM

## 2016-07-14 DIAGNOSIS — G92 Toxic encephalopathy: Principal | ICD-10-CM | POA: Diagnosis present

## 2016-07-14 DIAGNOSIS — Z794 Long term (current) use of insulin: Secondary | ICD-10-CM

## 2016-07-14 DIAGNOSIS — R110 Nausea: Secondary | ICD-10-CM

## 2016-07-14 DIAGNOSIS — G4730 Sleep apnea, unspecified: Secondary | ICD-10-CM | POA: Diagnosis present

## 2016-07-14 LAB — CBC
HCT: 30.6 % — ABNORMAL LOW (ref 36.0–46.0)
Hemoglobin: 9.8 g/dL — ABNORMAL LOW (ref 12.0–15.0)
MCH: 27.2 pg (ref 26.0–34.0)
MCHC: 32 g/dL (ref 30.0–36.0)
MCV: 85 fL (ref 78.0–100.0)
PLATELETS: 317 10*3/uL (ref 150–400)
RBC: 3.6 MIL/uL — AB (ref 3.87–5.11)
RDW: 15.4 % (ref 11.5–15.5)
WBC: 7.3 10*3/uL (ref 4.0–10.5)

## 2016-07-14 LAB — COMPREHENSIVE METABOLIC PANEL
ALBUMIN: 3.8 g/dL (ref 3.5–5.0)
ALT: 17 U/L (ref 14–54)
ANION GAP: 12 (ref 5–15)
AST: 28 U/L (ref 15–41)
Alkaline Phosphatase: 77 U/L (ref 38–126)
BUN: 13 mg/dL (ref 6–20)
CO2: 25 mmol/L (ref 22–32)
Calcium: 9.4 mg/dL (ref 8.9–10.3)
Chloride: 104 mmol/L (ref 101–111)
Creatinine, Ser: 1.01 mg/dL — ABNORMAL HIGH (ref 0.44–1.00)
GFR calc Af Amer: >60 mL/min (ref >60)
GFR calc non Af Amer: 53 mL/min — ABNORMAL LOW (ref >60)
GLUCOSE: 117 mg/dL — AB (ref 65–99)
Potassium: 3.8 mmol/L (ref 3.5–5.1)
SODIUM: 141 mmol/L (ref 135–145)
TOTAL PROTEIN: 6.9 g/dL (ref 6.5–8.1)
Total Bilirubin: 0.8 mg/dL (ref 0.3–1.2)

## 2016-07-14 LAB — DIFFERENTIAL
Basophils Absolute: 0 10*3/uL (ref 0.0–0.1)
Basophils Relative: 0 %
Eosinophils Absolute: 0.1 10*3/uL (ref 0.0–0.7)
Eosinophils Relative: 2 %
LYMPHS PCT: 24 %
Lymphs Abs: 1.7 10*3/uL (ref 0.7–4.0)
MONO ABS: 0.6 10*3/uL (ref 0.1–1.0)
Monocytes Relative: 8 %
NEUTROS ABS: 4.6 10*3/uL (ref 1.7–7.7)
Neutrophils Relative %: 66 %

## 2016-07-14 LAB — CBG MONITORING, ED
GLUCOSE-CAPILLARY: 118 mg/dL — AB (ref 65–99)
Glucose-Capillary: 123 mg/dL — ABNORMAL HIGH (ref 65–99)

## 2016-07-14 MED ORDER — LORAZEPAM 2 MG/ML IJ SOLN
1.0000 mg | Freq: Once | INTRAMUSCULAR | Status: AC
  Administered 2016-07-14: 1 mg via INTRAVENOUS
  Filled 2016-07-14: qty 1

## 2016-07-14 NOTE — ED Notes (Signed)
Notified CT that patient is ready to be transported. Patient lying flat, eyes closed, snoring loudly.

## 2016-07-14 NOTE — ED Notes (Signed)
Dr. Viviana Simpler at bedside at this time.

## 2016-07-14 NOTE — ED Triage Notes (Signed)
Pt reports she is having back pain and that she had surgery on march 3. She states she called Dr. Ellene Route today and says he told her to come straight to the ED. Pt is poor historian, flailing around in the wheelchair at triage, blank stare, altered mental status.

## 2016-07-14 NOTE — ED Notes (Signed)
Antonietta Breach informed of patient condition and states she will place orders.

## 2016-07-14 NOTE — ED Notes (Signed)
Patient transported to CT 

## 2016-07-14 NOTE — ED Notes (Signed)
Sisters of patient: Pat [phone] Pam [phone]

## 2016-07-14 NOTE — ED Notes (Signed)
Phlebotomy at bedside at this time.

## 2016-07-15 ENCOUNTER — Encounter (HOSPITAL_COMMUNITY): Payer: Self-pay | Admitting: Internal Medicine

## 2016-07-15 ENCOUNTER — Observation Stay (HOSPITAL_COMMUNITY): Payer: Medicare Other

## 2016-07-15 ENCOUNTER — Emergency Department (HOSPITAL_COMMUNITY): Payer: Medicare Other

## 2016-07-15 DIAGNOSIS — E1140 Type 2 diabetes mellitus with diabetic neuropathy, unspecified: Secondary | ICD-10-CM | POA: Diagnosis not present

## 2016-07-15 DIAGNOSIS — G9340 Encephalopathy, unspecified: Secondary | ICD-10-CM | POA: Diagnosis not present

## 2016-07-15 DIAGNOSIS — Z803 Family history of malignant neoplasm of breast: Secondary | ICD-10-CM | POA: Diagnosis not present

## 2016-07-15 DIAGNOSIS — M797 Fibromyalgia: Secondary | ICD-10-CM | POA: Diagnosis present

## 2016-07-15 DIAGNOSIS — J45909 Unspecified asthma, uncomplicated: Secondary | ICD-10-CM | POA: Diagnosis present

## 2016-07-15 DIAGNOSIS — G92 Toxic encephalopathy: Secondary | ICD-10-CM | POA: Diagnosis present

## 2016-07-15 DIAGNOSIS — T50995A Adverse effect of other drugs, medicaments and biological substances, initial encounter: Secondary | ICD-10-CM | POA: Diagnosis present

## 2016-07-15 DIAGNOSIS — R4182 Altered mental status, unspecified: Secondary | ICD-10-CM | POA: Diagnosis not present

## 2016-07-15 DIAGNOSIS — I10 Essential (primary) hypertension: Secondary | ICD-10-CM | POA: Diagnosis present

## 2016-07-15 DIAGNOSIS — Z91048 Other nonmedicinal substance allergy status: Secondary | ICD-10-CM | POA: Diagnosis not present

## 2016-07-15 DIAGNOSIS — R410 Disorientation, unspecified: Secondary | ICD-10-CM | POA: Diagnosis not present

## 2016-07-15 DIAGNOSIS — Z883 Allergy status to other anti-infective agents status: Secondary | ICD-10-CM | POA: Diagnosis not present

## 2016-07-15 DIAGNOSIS — I6789 Other cerebrovascular disease: Secondary | ICD-10-CM | POA: Diagnosis not present

## 2016-07-15 DIAGNOSIS — E785 Hyperlipidemia, unspecified: Secondary | ICD-10-CM | POA: Diagnosis present

## 2016-07-15 DIAGNOSIS — E876 Hypokalemia: Secondary | ICD-10-CM | POA: Diagnosis present

## 2016-07-15 DIAGNOSIS — G4730 Sleep apnea, unspecified: Secondary | ICD-10-CM | POA: Diagnosis present

## 2016-07-15 DIAGNOSIS — Y92009 Unspecified place in unspecified non-institutional (private) residence as the place of occurrence of the external cause: Secondary | ICD-10-CM | POA: Diagnosis not present

## 2016-07-15 DIAGNOSIS — Z87891 Personal history of nicotine dependence: Secondary | ICD-10-CM | POA: Diagnosis not present

## 2016-07-15 DIAGNOSIS — Z807 Family history of other malignant neoplasms of lymphoid, hematopoietic and related tissues: Secondary | ICD-10-CM | POA: Diagnosis not present

## 2016-07-15 DIAGNOSIS — Z888 Allergy status to other drugs, medicaments and biological substances status: Secondary | ICD-10-CM | POA: Diagnosis not present

## 2016-07-15 DIAGNOSIS — G253 Myoclonus: Secondary | ICD-10-CM | POA: Diagnosis not present

## 2016-07-15 DIAGNOSIS — K219 Gastro-esophageal reflux disease without esophagitis: Secondary | ICD-10-CM | POA: Diagnosis present

## 2016-07-15 DIAGNOSIS — Z8042 Family history of malignant neoplasm of prostate: Secondary | ICD-10-CM | POA: Diagnosis not present

## 2016-07-15 DIAGNOSIS — Z7982 Long term (current) use of aspirin: Secondary | ICD-10-CM | POA: Diagnosis not present

## 2016-07-15 DIAGNOSIS — D573 Sickle-cell trait: Secondary | ICD-10-CM | POA: Diagnosis present

## 2016-07-15 DIAGNOSIS — E039 Hypothyroidism, unspecified: Secondary | ICD-10-CM | POA: Diagnosis not present

## 2016-07-15 DIAGNOSIS — Z794 Long term (current) use of insulin: Secondary | ICD-10-CM | POA: Diagnosis not present

## 2016-07-15 DIAGNOSIS — Z8041 Family history of malignant neoplasm of ovary: Secondary | ICD-10-CM | POA: Diagnosis not present

## 2016-07-15 DIAGNOSIS — R110 Nausea: Secondary | ICD-10-CM | POA: Diagnosis not present

## 2016-07-15 DIAGNOSIS — T402X5A Adverse effect of other opioids, initial encounter: Secondary | ICD-10-CM | POA: Diagnosis present

## 2016-07-15 HISTORY — DX: Encephalopathy, unspecified: G93.40

## 2016-07-15 LAB — CBC WITH DIFFERENTIAL/PLATELET
Basophils Absolute: 0 10*3/uL (ref 0.0–0.1)
Basophils Relative: 0 %
EOS ABS: 0.1 10*3/uL (ref 0.0–0.7)
EOS PCT: 1 %
HCT: 29.1 % — ABNORMAL LOW (ref 36.0–46.0)
HEMOGLOBIN: 9.3 g/dL — AB (ref 12.0–15.0)
LYMPHS ABS: 1.1 10*3/uL (ref 0.7–4.0)
Lymphocytes Relative: 16 %
MCH: 27.1 pg (ref 26.0–34.0)
MCHC: 32 g/dL (ref 30.0–36.0)
MCV: 84.8 fL (ref 78.0–100.0)
MONOS PCT: 8 %
Monocytes Absolute: 0.6 10*3/uL (ref 0.1–1.0)
Neutro Abs: 5.1 10*3/uL (ref 1.7–7.7)
Neutrophils Relative %: 75 %
PLATELETS: 325 10*3/uL (ref 150–400)
RBC: 3.43 MIL/uL — ABNORMAL LOW (ref 3.87–5.11)
RDW: 15.3 % (ref 11.5–15.5)
WBC: 6.9 10*3/uL (ref 4.0–10.5)

## 2016-07-15 LAB — URINALYSIS, ROUTINE W REFLEX MICROSCOPIC
BILIRUBIN URINE: NEGATIVE
Glucose, UA: NEGATIVE mg/dL
Hgb urine dipstick: NEGATIVE
KETONES UR: NEGATIVE mg/dL
LEUKOCYTES UA: NEGATIVE
NITRITE: NEGATIVE
PH: 5 (ref 5.0–8.0)
Protein, ur: NEGATIVE mg/dL
Specific Gravity, Urine: 1.009 (ref 1.005–1.030)

## 2016-07-15 LAB — RAPID URINE DRUG SCREEN, HOSP PERFORMED
Amphetamines: NOT DETECTED
BARBITURATES: NOT DETECTED
Benzodiazepines: POSITIVE — AB
COCAINE: NOT DETECTED
OPIATES: POSITIVE — AB
Tetrahydrocannabinol: NOT DETECTED

## 2016-07-15 LAB — COMPREHENSIVE METABOLIC PANEL
ALK PHOS: 68 U/L (ref 38–126)
ALT: 16 U/L (ref 14–54)
AST: 27 U/L (ref 15–41)
Albumin: 3.5 g/dL (ref 3.5–5.0)
Anion gap: 10 (ref 5–15)
BUN: 12 mg/dL (ref 6–20)
CHLORIDE: 105 mmol/L (ref 101–111)
CO2: 26 mmol/L (ref 22–32)
CREATININE: 0.89 mg/dL (ref 0.44–1.00)
Calcium: 9.1 mg/dL (ref 8.9–10.3)
GFR calc Af Amer: >60 mL/min (ref >60)
GFR calc non Af Amer: >60 mL/min (ref >60)
GLUCOSE: 112 mg/dL — AB (ref 65–99)
Potassium: 3.1 mmol/L — ABNORMAL LOW (ref 3.5–5.1)
Sodium: 141 mmol/L (ref 135–145)
Total Bilirubin: 0.8 mg/dL (ref 0.3–1.2)
Total Protein: 6.1 g/dL — ABNORMAL LOW (ref 6.5–8.1)

## 2016-07-15 LAB — MRSA PCR SCREENING: MRSA BY PCR: NEGATIVE

## 2016-07-15 LAB — GLUCOSE, CAPILLARY
GLUCOSE-CAPILLARY: 103 mg/dL — AB (ref 65–99)
GLUCOSE-CAPILLARY: 134 mg/dL — AB (ref 65–99)
Glucose-Capillary: 118 mg/dL — ABNORMAL HIGH (ref 65–99)
Glucose-Capillary: 184 mg/dL — ABNORMAL HIGH (ref 65–99)
Glucose-Capillary: 89 mg/dL (ref 65–99)

## 2016-07-15 LAB — SALICYLATE LEVEL

## 2016-07-15 LAB — CBG MONITORING, ED: GLUCOSE-CAPILLARY: 106 mg/dL — AB (ref 65–99)

## 2016-07-15 LAB — ETHANOL: Alcohol, Ethyl (B): <5 mg/dL (ref <5)

## 2016-07-15 LAB — AMMONIA: AMMONIA: 27 umol/L (ref 9–35)

## 2016-07-15 LAB — ACETAMINOPHEN LEVEL

## 2016-07-15 MED ORDER — ACETAMINOPHEN 325 MG PO TABS
650.0000 mg | ORAL_TABLET | Freq: Four times a day (QID) | ORAL | Status: DC | PRN
  Administered 2016-07-18 – 2016-07-19 (×3): 650 mg via ORAL
  Filled 2016-07-15 (×5): qty 2

## 2016-07-15 MED ORDER — OXYCODONE-ACETAMINOPHEN 5-325 MG PO TABS
1.0000 | ORAL_TABLET | ORAL | Status: DC | PRN
  Administered 2016-07-15: 1 via ORAL
  Filled 2016-07-15: qty 1

## 2016-07-15 MED ORDER — GABAPENTIN 600 MG PO TABS: 300.0000 mg | ORAL_TABLET | Freq: Three times a day (TID) | ORAL | Status: DC

## 2016-07-15 MED ORDER — SODIUM CHLORIDE 0.9 % IV SOLN
INTRAVENOUS | Status: DC
  Administered 2016-07-15 – 2016-07-16 (×3): via INTRAVENOUS

## 2016-07-15 MED ORDER — POLYETHYLENE GLYCOL 3350 17 G PO PACK
17.0000 g | PACK | Freq: Every day | ORAL | Status: DC | PRN
  Administered 2016-07-19: 17 g via ORAL
  Filled 2016-07-15 (×2): qty 1

## 2016-07-15 MED ORDER — MOMETASONE FURO-FORMOTEROL FUM 100-5 MCG/ACT IN AERO
2.0000 | INHALATION_SPRAY | Freq: Two times a day (BID) | RESPIRATORY_TRACT | Status: DC
  Administered 2016-07-15 – 2016-07-19 (×6): 2 via RESPIRATORY_TRACT
  Filled 2016-07-15: qty 8.8

## 2016-07-15 MED ORDER — ACETAMINOPHEN 650 MG RE SUPP: 650.0000 mg | Freq: Four times a day (QID) | RECTAL | Status: DC | PRN

## 2016-07-15 MED ORDER — GABAPENTIN 300 MG PO CAPS
300.0000 mg | ORAL_CAPSULE | Freq: Three times a day (TID) | ORAL | Status: DC
  Administered 2016-07-15: 300 mg via ORAL
  Filled 2016-07-15: qty 1

## 2016-07-15 MED ORDER — GABAPENTIN 100 MG PO CAPS
100.0000 mg | ORAL_CAPSULE | Freq: Three times a day (TID) | ORAL | Status: DC
  Administered 2016-07-15 – 2016-07-19 (×12): 100 mg via ORAL
  Filled 2016-07-15 (×12): qty 1

## 2016-07-15 MED ORDER — SENNOSIDES-DOCUSATE SODIUM 8.6-50 MG PO TABS
1.0000 | ORAL_TABLET | Freq: Two times a day (BID) | ORAL | Status: DC
  Administered 2016-07-15 – 2016-07-19 (×7): 1 via ORAL
  Filled 2016-07-15 (×8): qty 1

## 2016-07-15 MED ORDER — OXYCODONE-ACETAMINOPHEN 5-325 MG PO TABS
1.0000 | ORAL_TABLET | Freq: Four times a day (QID) | ORAL | Status: DC | PRN
  Administered 2016-07-15 – 2016-07-16 (×3): 1 via ORAL
  Filled 2016-07-15 (×4): qty 1

## 2016-07-15 MED ORDER — LEVOTHYROXINE SODIUM 25 MCG PO TABS
25.0000 ug | ORAL_TABLET | Freq: Every day | ORAL | Status: DC
  Administered 2016-07-16 – 2016-07-19 (×3): 25 ug via ORAL
  Filled 2016-07-15 (×4): qty 1

## 2016-07-15 MED ORDER — HYDRALAZINE HCL 20 MG/ML IJ SOLN: 10.0000 mg | INTRAMUSCULAR | Status: DC | PRN

## 2016-07-15 MED ORDER — LEVOTHYROXINE SODIUM 100 MCG IV SOLR
12.5000 ug | Freq: Every day | INTRAVENOUS | Status: DC
  Administered 2016-07-15: 12.5 ug via INTRAVENOUS
  Filled 2016-07-15: qty 5

## 2016-07-15 MED ORDER — INSULIN ASPART 100 UNIT/ML ~~LOC~~ SOLN
0.0000 [IU] | SUBCUTANEOUS | Status: DC
  Administered 2016-07-15: 2 [IU] via SUBCUTANEOUS
  Administered 2016-07-15 – 2016-07-16 (×2): 1 [IU] via SUBCUTANEOUS
  Administered 2016-07-16: 3 [IU] via SUBCUTANEOUS
  Administered 2016-07-17 (×3): 2 [IU] via SUBCUTANEOUS

## 2016-07-15 MED ORDER — ASPIRIN EC 81 MG PO TBEC
81.0000 mg | DELAYED_RELEASE_TABLET | Freq: Every day | ORAL | Status: DC
  Administered 2016-07-16 – 2016-07-19 (×4): 81 mg via ORAL
  Filled 2016-07-15 (×4): qty 1

## 2016-07-15 MED ORDER — SODIUM CHLORIDE 0.9 % IV SOLN
INTRAVENOUS | Status: DC
  Administered 2016-07-15: 06:00:00 via INTRAVENOUS

## 2016-07-15 NOTE — H&P (Addendum)
History and Physical    [name] HDQ:[id number] DOB: 1940-06-26 DOA: 07/14/2016  PCP: Wenda Low, MD  Patient coming from: Home.  Chief Complaint: Confusion.  HPI: [name] is a 76 y.o. female with history of diabetes mellitus, hypothyroidism, hypertension and chronic anemia who recently had lumbar decompression surgery was brought to the ER after patient was found to be increasingly confused. Patient was brought to the ER 2 days ago after patient was complaining of increasing pain and also appeared confused. At that time MRI of the T-spine and L-spine done did not show anything acute and patient was discharged home. Yesterday patient was found to be confused again and also had some myoclonic jerks. After contacting patient's neurosurgeon Dr. Ellene Route patient was brought to the ER.   ED Course: In the ER patient was found to be agitated and had to be given Ativan. CT head was unremarkable. Patient is being admitted for further observation. By the time I examined patient has been sedated after patient receiving Ativan. Patient remains afebrile. Pupils reacting. Ammonia levels were negative.  Review of Systems: As per HPI, rest all negative.   Past Medical History:  Diagnosis Date  . Anemia    has sickle cell trait  . Anxiety   . Arthritis   . Asthma    has used inhaler in past for asthmatic bronchitis, last time- early 2012  . Complication of anesthesia    wakes up shaking  . Diabetes mellitus   . Dyslipidemia   . Fibromyalgia   . GERD (gastroesophageal reflux disease)    occas. use of  Prilosec  . Heart murmur    sees Dr. Montez Morita, last seen- early 2012  . Hypertension    02/2010- stress test /w PCP  . Hypothyroidism   . Neuromuscular disorder (HCC)    lumbar radiculopathy, lumbago  . Nocturnal leg cramps 09/27/2014  . Pneumonia   . Sickle cell trait ([place])   . Sleep apnea    borderline sleep apnea, states she no longer uses, early 2012- stopped using      Past Surgical History:  Procedure Laterality Date  . ABDOMINAL HYSTERECTOMY    . adb.cyst     ovarian cyst  . BACK SURGERY     2012, 2015 (3 total)  . COLONOSCOPY    . EYE SURGERY     macular degeneration treatment - injections  . OVARIAN CYST SURGERY       reports that she quit smoking about 17 years ago. She has never used smokeless tobacco. She reports that she drinks alcohol. She reports that she does not use drugs.  Allergies  Allergen Reactions  . Betadine [Povidone Iodine] Swelling    Reaction to betadine eye drops  . Cymbalta [Duloxetine Hcl] Other (See Comments)    Dizziness   . Adhesive [Tape] Rash  . Lipitor [Atorvastatin] Rash    Family History  Problem Relation Age of Onset  . Ovarian cancer Mother   . Cancer - Prostate Father   . Breast cancer Paternal Aunt   . Multiple myeloma Paternal Aunt   . Anesthesia problems Neg Hx   . Hypotension Neg Hx   . Malignant hyperthermia Neg Hx   . Pseudochol deficiency Neg Hx     Prior to Admission medications   Medication Sig Start Date End Date Taking? Authorizing Provider  aspirin 81 MG EC tablet Take 47m by mouth once daily 01/16/15   Historical Provider, MD  baclofen (LIORESAL) 10 MG tablet Take  1 tablet (10 mg total) by mouth at bedtime. 07/03/16   Lavon Paganini Angiulli, PA-C  calcium carbonate (TUMS EX) 750 MG chewable tablet Chew 1 tablet by mouth 2 (two) times daily as needed for heartburn (indigestion).    Historical Provider, MD  Calcium-Magnesium-Zinc (CAL-MAG-ZINC PO) Take 1 tablet by mouth daily.    Historical Provider, MD  cetirizine (ZYRTEC) 10 MG tablet Take 10 mg by mouth daily.    Historical Provider, MD  diclofenac sodium (VOLTAREN) 1 % GEL Apply 4 g topically 4 (four) times daily. Voltaren Gel 3 grams to 3 large joints upto TID 3 TUBES with 3 refills Patient taking differently: Apply 4 g topically 2 (two) times daily as needed (muscle pain).  03/28/16 09/24/16  Naitik Panwala, PA-C  ezetimibe (ZETIA) 10  MG tablet TAKE 1 TABLET(10 MG) BY MOUTH DAILY 07/03/16   Lavon Paganini Angiulli, PA-C  gabapentin (NEURONTIN) 300 MG capsule TAKE 1 CAPSULE(300 MG) BY MOUTH THREE TIMES DAILY 07/03/16   Lavon Paganini Angiulli, PA-C  glimepiride (AMARYL) 2 MG tablet Take 1 tablet (2 mg total) by mouth daily with breakfast. 07/03/16   Lavon Paganini Angiulli, PA-C  HYDROcodone-acetaminophen (NORCO/VICODIN) 5-325 MG tablet Take 1-2 tablets by mouth every 4 (four) hours as needed (breakthrough pain). 07/03/16   Lavon Paganini Angiulli, PA-C  insulin aspart (NOVOLOG) 100 UNIT/ML FlexPen Inject 6 Units into the skin 3 (three) times daily with meals. 07/03/16   Lavon Paganini Angiulli, PA-C  insulin aspart (NOVOLOG) 100 UNIT/ML injection Inject 6 Units into the skin 3 (three) times daily with meals. 07/03/16   Lavon Paganini Angiulli, PA-C  insulin detemir (LEVEMIR) 100 UNIT/ML injection Inject 0.12 mLs (12 Units total) into the skin at bedtime. 07/03/16   Lavon Paganini Angiulli, PA-C  Insulin Pen Needle (BD PEN NEEDLE NANO U/F) 32G X 4 MM MISC USE AS DIRECTED 07/03/16   Lavon Paganini Angiulli, PA-C  levothyroxine (SYNTHROID, LEVOTHROID) 25 MCG tablet TAKE 1 TABLET(25 MCG) BY MOUTH DAILY BEFORE BREAKFAST 07/03/16   Daniel J Angiulli, PA-C  LYRICA 50 MG capsule Take 1 capsule (50 mg total) by mouth at bedtime as needed (pain). Reported on 07/06/2015 07/03/16   Lavon Paganini Angiulli, PA-C  oxyCODONE-acetaminophen (PERCOCET) 5-325 MG tablet Take 1 tablet by mouth every 4 (four) hours as needed. 07/13/16   Merrily Pew, MD  polyethylene glycol (MIRALAX / GLYCOLAX) packet Take 17 g by mouth daily as needed for mild constipation. 07/03/16   Lavon Paganini Angiulli, PA-C  potassium chloride (K-DUR,KLOR-CON) 10 MEQ tablet Take 1 tablet (10 mEq total) by mouth daily. 07/03/16   Lavon Paganini Angiulli, PA-C  PROAIR RESPICLICK 982 (90 Base) MCG/ACT AEPB Inhale 2 puffs into the lungs 3 (three) times daily as needed (shortness of breath).  05/29/16   Historical Provider, MD  saccharomyces boulardii (FLORASTOR)  250 MG capsule Take 1 capsule (250 mg total) by mouth 2 (two) times daily. 07/03/16   Daniel J Angiulli, PA-C  senna-docusate (SENOKOT-S) 8.6-50 MG tablet Take 1 tablet by mouth 2 (two) times daily. 07/03/16   Lavon Paganini Angiulli, PA-C  SYMBICORT 80-4.5 MCG/ACT inhaler Inhale 2 puffs into the lungs 2 (two) times daily as needed (shortness of breath).  05/29/16   Historical Provider, MD  valsartan-hydrochlorothiazide (DIOVAN-HCT) 320-12.5 MG tablet Take 1 tablet by mouth daily. 07/03/16   Lavon Paganini Angiulli, PA-C  vitamin B-12 (CYANOCOBALAMIN) 500 MCG tablet Take 500 mcg by mouth daily.    Historical Provider, MD  vitamin E 400 UNIT capsule Take 400 Units  by mouth daily.    Historical Provider, MD    Physical Exam: Vitals:   07/15/16 0100 07/15/16 0115 07/15/16 0145 07/15/16 0200  BP: (!) 153/75 127/69 123/68 127/68  Pulse: 79 77 75 73  Resp:      Temp:      TempSrc:      SpO2: 100% 95% 95% 97%      Constitutional: Moderately built and nourished. Vitals:   07/15/16 0100 07/15/16 0115 07/15/16 0145 07/15/16 0200  BP: (!) 153/75 127/69 123/68 127/68  Pulse: 79 77 75 73  Resp:      Temp:      TempSrc:      SpO2: 100% 95% 95% 97%   Eyes: Anicteric. No pallor. ENMT: No discharge from the ears eyes nose or mouth. Neck: No mass felt. No neck rigidity. Respiratory: No rhonchi or crepitations. Cardiovascular: S1-S2 heard no murmurs appreciated.  Abdomen: Soft nontender bowel sounds present. No guarding or rigidity. Musculoskeletal: No edema. No joint effusion. Skin: No rash. Skin appears warm. Neurologic: Patient is sedated. Pupils are reacting. Psychiatric: Patient is sedated.   Labs on Admission: I have personally reviewed following labs and imaging studies  CBC:  Recent Labs Lab 07/13/16 0912 07/14/16 2208  WBC 7.2 7.3  NEUTROABS 5.4 4.6  HGB 9.8* 9.8*  HCT 30.7* 30.6*  MCV 84.8 85.0  PLT 332 916   Basic Metabolic Panel:  Recent Labs Lab 07/13/16 0912 07/14/16 2208   NA 142 141  K 4.5 3.8  CL 103 104  CO2 30 25  GLUCOSE 197* 117*  BUN 20 13  CREATININE 1.12* 1.01*  CALCIUM 9.5 9.4   GFR: Estimated Creatinine Clearance: 51 mL/min (A) (by C-G formula based on SCr of 1.01 mg/dL (H)). Liver Function Tests:  Recent Labs Lab 07/13/16 0912 07/14/16 2208  AST 24 28  ALT 13* 17  ALKPHOS 78 77  BILITOT 0.6 0.8  PROT 6.5 6.9  ALBUMIN 3.7 3.8   No results for input(s): LIPASE, AMYLASE in the last 168 hours.  Recent Labs Lab 07/15/16 0142  AMMONIA 27   Coagulation Profile: No results for input(s): INR, PROTIME in the last 168 hours. Cardiac Enzymes: No results for input(s): CKTOTAL, CKMB, CKMBINDEX, TROPONINI in the last 168 hours. BNP (last 3 results) No results for input(s): PROBNP in the last 8760 hours. HbA1C: No results for input(s): HGBA1C in the last 72 hours. CBG:  Recent Labs Lab 07/14/16 2150 07/14/16 2151  GLUCAP 118* 123*   Lipid Profile: No results for input(s): CHOL, HDL, LDLCALC, TRIG, CHOLHDL, LDLDIRECT in the last 72 hours. Thyroid Function Tests: No results for input(s): TSH, T4TOTAL, FREET4, T3FREE, THYROIDAB in the last 72 hours. Anemia Panel: No results for input(s): VITAMINB12, FOLATE, FERRITIN, TIBC, IRON, RETICCTPCT in the last 72 hours. Urine analysis:    Component Value Date/Time   COLORURINE STRAW (A) 07/15/2016 0023   APPEARANCEUR CLEAR 07/15/2016 0023   LABSPEC 1.009 07/15/2016 0023   PHURINE 5.0 07/15/2016 0023   GLUCOSEU NEGATIVE 07/15/2016 0023   GLUCOSEU 250 (A) 05/30/2014 1623   HGBUR NEGATIVE 07/15/2016 0023   BILIRUBINUR NEGATIVE 07/15/2016 0023   KETONESUR NEGATIVE 07/15/2016 0023   PROTEINUR NEGATIVE 07/15/2016 0023   UROBILINOGEN 0.2 05/30/2014 1623   NITRITE NEGATIVE 07/15/2016 0023   LEUKOCYTESUR NEGATIVE 07/15/2016 0023   Sepsis Labs: '@LABRCNTIP' (procalcitonin:4,lacticidven:4) )No results found for this or any previous visit (from the past 240 hour(s)).   Radiological Exams on  Admission: Dg Chest 1 View  Result Date:  07/14/2016 CLINICAL DATA:  Acute onset of altered mental status. Upper back pain. Initial encounter. EXAM: CHEST 1 VIEW COMPARISON:  Chest radiograph performed 05/18/2016 FINDINGS: The lungs are well-aerated and clear. There is no evidence of focal opacification, pleural effusion or pneumothorax. The cardiomediastinal silhouette is borderline enlarged. No acute osseous abnormalities are seen. Lumbar spinal fusion hardware is noted. IMPRESSION: Borderline cardiomegaly.  Lungs remain grossly clear. Electronically Signed   By: Garald Balding M.D.   On: 07/14/2016 23:53   Ct Head Wo Contrast  Result Date: 07/15/2016 CLINICAL DATA:  Acute onset of confusion.  Initial encounter. EXAM: CT HEAD WITHOUT CONTRAST TECHNIQUE: Contiguous axial images were obtained from the base of the skull through the vertex without intravenous contrast. COMPARISON:  MRI of the brain performed 08/01/2015, and CT of the head performed 06/16/2012 FINDINGS: Brain: No evidence of acute infarction, hemorrhage, hydrocephalus, extra-axial collection or mass lesion/mass effect. Prominence of the ventricles and sulci reflects mild cortical volume loss. Mild periventricular white matter change likely reflects small vessel ischemic microangiopathy. The brainstem and fourth ventricle are within normal limits. The basal ganglia are unremarkable in appearance. The cerebral hemispheres demonstrate grossly normal gray-white differentiation. No mass effect or midline shift is seen. Vascular: No hyperdense vessel or unexpected calcification. Skull: There is no evidence of fracture; visualized osseous structures are unremarkable in appearance. Sinuses/Orbits: The visualized portions of the orbits are within normal limits. The paranasal sinuses and mastoid air cells are well-aerated. Other: No significant soft tissue abnormalities are seen. IMPRESSION: 1. No acute intracranial pathology seen on CT. 2. Mild cortical  volume loss and scattered small vessel ischemic microangiopathy. Electronically Signed   By: Garald Balding M.D.   On: 07/15/2016 00:27   Mr Thoracic Spine W Wo Contrast  Result Date: 07/13/2016 CLINICAL DATA:  Severe back pain. Recent lumbar fusion on 06/25/2016. EXAM: MRI THORACIC AND LUMBAR SPINE WITHOUT AND WITH CONTRAST TECHNIQUE: Multiplanar and multiecho pulse sequences of the thoracic and lumbar spine were obtained without and with intravenous contrast. CONTRAST:  46m MULTIHANCE GADOBENATE DIMEGLUMINE 529 MG/ML IV SOLN COMPARISON:  Preoperative MRI 06/18/2016. Intraoperative radiographs 06/25/2016. Postoperative lumbar spine films 07/11/2016. FINDINGS: MRI THORACIC SPINE FINDINGS Alignment:  Physiologic. Vertebrae: No fracture, evidence of discitis, or bone lesion. Cord:  Normal signal and morphology. Paraspinal and other soft tissues: Negative. Disc levels: Shallow disc protrusions at RIGHT T7-8, centrally at T8-9, and centrally at T12-L1 do not result in significant spinal stenosis or cord compression. MRI LUMBAR SPINE FINDINGS Segmentation:  Standard Alignment: Anatomic except for 3 mm anterolisthesis L2 on L3, and 3 mm anterolisthesis L5 on S1. Vertebrae: There is an acute inferior endplate fracture of L2, with subsequent subsidence of the cage upward. There is loss of interspace height at L2-3 and anterior translation. Reference image 9 series 6. Mild postcontrast enhancement of L2. Conus medullaris: Extends to the L1 level and appears normal. Paraspinal and other soft tissues: Uncomplicated appearing seroma at the site of laminectomy. Disc levels: L1-L2:  Normal. L2-L3: Status post posterolateral and interbody fusion. Cage subsidence due to endplate softening, upward into L2 with accompanying bone marrow edema. Associated loss of disc space height. 3 mm anterolisthesis. Enhancement in the ventral epidural space, up to 5 mm thick, not necessarily pathologic or unexpected given the large disc  extrusion which was present preoperatively. Mild flattening of the thecal sac without significant spinal stenosis, see axial image 18 series 7. Presence or absence of L3 nerve root impingement difficult to establish. No features to strongly  suggest postoperative infection. L3-L4:  Unremarkable post fusion interspace. L4-L5:  Unremarkable post fusion interspace. L5-S1: Fixed anterolisthesis status post posterior and interbody fusion. No impingement. Correlating the MR findings with plain films from 07/11/2016 CNSA, cage subsidence is confirmed. The appearance of the cage at L2-3 and the inferior endplate of L2 and is a change from the intraoperative films of 06/25/2016. IMPRESSION: Unremarkable thoracic spine MRI without and with contrast. Cage subsidence L2-3, status post PLIF 06/25/2016. Upward migration of the interbody cage into the inferior endplate of L2, with subsequent loss of intervertebral disc height, represents an interval change from the intraoperative radiographs. No features strongly suggestive of disc space infection. Mild enhancement of the ventral epidural space at L2, not necessarily unexpected given the large disc extrusion present preoperatively. Electronically Signed   By: Staci Righter M.D.   On: 07/13/2016 12:41   Mr Lumbar Spine W Wo Contrast  Result Date: 07/13/2016 CLINICAL DATA:  Severe back pain. Recent lumbar fusion on 06/25/2016. EXAM: MRI THORACIC AND LUMBAR SPINE WITHOUT AND WITH CONTRAST TECHNIQUE: Multiplanar and multiecho pulse sequences of the thoracic and lumbar spine were obtained without and with intravenous contrast. CONTRAST:  21m MULTIHANCE GADOBENATE DIMEGLUMINE 529 MG/ML IV SOLN COMPARISON:  Preoperative MRI 06/18/2016. Intraoperative radiographs 06/25/2016. Postoperative lumbar spine films 07/11/2016. FINDINGS: MRI THORACIC SPINE FINDINGS Alignment:  Physiologic. Vertebrae: No fracture, evidence of discitis, or bone lesion. Cord:  Normal signal and morphology.  Paraspinal and other soft tissues: Negative. Disc levels: Shallow disc protrusions at RIGHT T7-8, centrally at T8-9, and centrally at T12-L1 do not result in significant spinal stenosis or cord compression. MRI LUMBAR SPINE FINDINGS Segmentation:  Standard Alignment: Anatomic except for 3 mm anterolisthesis L2 on L3, and 3 mm anterolisthesis L5 on S1. Vertebrae: There is an acute inferior endplate fracture of L2, with subsequent subsidence of the cage upward. There is loss of interspace height at L2-3 and anterior translation. Reference image 9 series 6. Mild postcontrast enhancement of L2. Conus medullaris: Extends to the L1 level and appears normal. Paraspinal and other soft tissues: Uncomplicated appearing seroma at the site of laminectomy. Disc levels: L1-L2:  Normal. L2-L3: Status post posterolateral and interbody fusion. Cage subsidence due to endplate softening, upward into L2 with accompanying bone marrow edema. Associated loss of disc space height. 3 mm anterolisthesis. Enhancement in the ventral epidural space, up to 5 mm thick, not necessarily pathologic or unexpected given the large disc extrusion which was present preoperatively. Mild flattening of the thecal sac without significant spinal stenosis, see axial image 18 series 7. Presence or absence of L3 nerve root impingement difficult to establish. No features to strongly suggest postoperative infection. L3-L4:  Unremarkable post fusion interspace. L4-L5:  Unremarkable post fusion interspace. L5-S1: Fixed anterolisthesis status post posterior and interbody fusion. No impingement. Correlating the MR findings with plain films from 07/11/2016 CNSA, cage subsidence is confirmed. The appearance of the cage at L2-3 and the inferior endplate of L2 and is a change from the intraoperative films of 06/25/2016. IMPRESSION: Unremarkable thoracic spine MRI without and with contrast. Cage subsidence L2-3, status post PLIF 06/25/2016. Upward migration of the  interbody cage into the inferior endplate of L2, with subsequent loss of intervertebral disc height, represents an interval change from the intraoperative radiographs. No features strongly suggestive of disc space infection. Mild enhancement of the ventral epidural space at L2, not necessarily unexpected given the large disc extrusion present preoperatively. Electronically Signed   By: JStaci RighterM.D.   On: 07/13/2016  12:41     Assessment/Plan Principal Problem:   Acute encephalopathy Active Problems:   Diabetes mellitus with neuropathy (HCC)   Hypothyroidism    1. Acute encephalopathy likely medication induced - for now I'm holding off all medications. Once patient becomes more alert awake we will slowly restart medications. Discussed with patient's neurosurgeon Dr. Ellene Route was advised to hold off baclofen. Since patient had myoclonic jerks will check EEG. 2. Diabetes mellitus type 2 - will keep patient on sliding scale coverage. 3. Hypothyroidism on IV Synthroid until patient is awake to take oral medications. 4. Hypertension will keep patient on when necessary IV hydralazine until patient is alert and awake.  5. Anemia normocytic normochromic - follow CBC.   DVT prophylaxis: SCDs. Code Status: Full code.  Family Communication: No family at bedside.  Disposition Plan: Home.  Consults called: None. Discussed with neurosurgeon.  Admission status: Observation.    Rise Patience MD [facility] Pager [phone].  If 7PM-7AM, please contact night-coverage [email or web address] Password [facility]  07/15/2016, 3:19 AM

## 2016-07-15 NOTE — Progress Notes (Signed)
[place] TEAM 1 - Stepdown/ICU TEAM  [name]  WCH:[id number] DOB: [birth year] DOA: 07/14/2016 PCP: Wenda Low, MD    Brief Narrative:  76 y.o. female with history of diabetes mellitus, hypothyroidism, hypertension and chronic anemia who recently had lumbar decompression surgery was brought to the ER after found to be increasingly confused. Patient was also brought to the ER 2 days prior complaining of increasing pain and also appearing to be confused. At that time MRI of the T-spine and L-spine done did not show anything acute and patient was discharged home. At home the pt was found to be confused again and also had some myoclonic jerks. After contacting her Neurosurgeon (Dr. Ellene Route) the patient was brought to the ER.   In the ER the patient was found to be agitated and was given Ativan. CT head was unremarkable.  Subjective: Pt is seen for a f/u visit.    Assessment & Plan:  Acute delirium/encephalopathy UDS + for opiates and benzos - ammonia normal   DM 2  Hypokalemia   Hypothyroidism  HTN  Normocytic anemia  DVT prophylaxis: SCDs Code Status: FULL CODE Family Communication: no family present at time of exam  Disposition Plan:   Consultants:  None   Procedures: 3/26 EEG - diffuse slowing   Antimicrobials:  None  Objective: Blood pressure 135/83, pulse 67, temperature 98.4 F (36.9 C), temperature source Oral, resp. rate 14, height 5\' 6"  (1.676 m), weight 80.8 kg (178 lb 3.2 oz), SpO2 98 %.  Intake/Output Summary (Last 24 hours) at 07/15/16 1420 Last data filed at 07/15/16 1200  Gross per 24 hour  Intake              200 ml  Output              350 ml  Net             -150 ml   Filed Weights   07/15/16 0524  Weight: 80.8 kg (178 lb 3.2 oz)    Examination: Pt was seen for a f/u visit.    CBC:  Recent Labs Lab 07/13/16 0912 07/14/16 2208 07/15/16 0323  WBC 7.2 7.3 6.9  NEUTROABS 5.4 4.6 5.1  HGB 9.8* 9.8* 9.3*  HCT 30.7* 30.6* 29.1*   MCV 84.8 85.0 84.8  PLT 332 317 353   Basic Metabolic Panel:  Recent Labs Lab 07/13/16 0912 07/14/16 2208 07/15/16 0323  NA 142 141 141  K 4.5 3.8 3.1*  CL 103 104 105  CO2 30 25 26   GLUCOSE 197* 117* 112*  BUN 20 13 12   CREATININE 1.12* 1.01* 0.89  CALCIUM 9.5 9.4 9.1   GFR: Estimated Creatinine Clearance: 57.6 mL/min (by C-G formula based on SCr of 0.89 mg/dL).  Liver Function Tests:  Recent Labs Lab 07/13/16 0912 07/14/16 2208 07/15/16 0323  AST 24 28 27   ALT 13* 17 16  ALKPHOS 78 77 68  BILITOT 0.6 0.8 0.8  PROT 6.5 6.9 6.1*  ALBUMIN 3.7 3.8 3.5    Recent Labs Lab 07/15/16 0142  AMMONIA 27    HbA1C: Hgb A1c MFr Bld  Date/Time Value Ref Range Status  06/26/2016 12:00 PM 7.8 (H) 4.8 - 5.6 % Final    Comment:    (NOTE)         Pre-diabetes: 5.7 - 6.4         Diabetes: >6.4         Glycemic control for adults with diabetes: <7.0   05/24/2016 10:19  AM 8.1 (H) 4.6 - 6.5 % Final    Comment:    Glycemic Control Guidelines for People with Diabetes:Non Diabetic:  <6%Goal of Therapy: <7%Additional Action Suggested:  >8%     CBG:  Recent Labs Lab 07/14/16 2150 07/14/16 2151 07/15/16 0353 07/15/16 0831 07/15/16 1244  GLUCAP 118* 123* 106* 103* 134*    Recent Results (from the past 240 hour(s))  MRSA PCR Screening     Status: None   Collection Time: 07/15/16  5:27 AM  Result Value Ref Range Status   MRSA by PCR NEGATIVE NEGATIVE Final    Comment:        The GeneXpert MRSA Assay (FDA approved for NASAL specimens only), is one component of a comprehensive MRSA colonization surveillance program. It is not intended to diagnose MRSA infection nor to guide or monitor treatment for MRSA infections.      Scheduled Meds: . gabapentin  300 mg Oral TID  . insulin aspart  0-9 Units Subcutaneous Q4H  . levothyroxine  12.5 mcg Intravenous QAC breakfast  . mometasone-formoterol  2 puff Inhalation BID   Continuous Infusions: . sodium chloride 75  mL/hr at 07/15/16 1400     LOS: 0 days   Time spent: No Charge  Cherene Altes, MD [facility] Office  [phone] Pager - Text Page per Shea Evans as per below:  On-Call/Text Page:      [email or web address]      password [email or web address]  If 7PM-7AM, please contact night-coverage [email or web address] Password [email or web address] 07/15/2016, 2:20 PM

## 2016-07-15 NOTE — ED Provider Notes (Signed)
[facility] DEPT Provider Note   CSN: [id number] Arrival date & time: 07/14/16  2127     History   Chief Complaint Chief Complaint  Patient presents with  . Back Pain  . Altered Mental Status    HPI [name] is a 76 y.o. female.  HPI  History is provided by the patient's neighbor given poor history provided by patient.  76 year old female who presents with back pain and confusion. She has history of HTN, HLD, DM.  She underwent laminotomies and discectomy L2-L3 and fusion by Dr. Ellene Route on 06/25/2016. Doing well post operatively. 4 days ago patient has been behaving weirdly, developing spasming in her extremities and complaining of significant back pain. States she has tremoring of her arms when she holds thing that she is unable to feed herself. Seen in ED yesterday. Underwent MRI of the thoracic and lumbar spine that was unremarkable. EDP spoke with Dr. Arnoldo Morale and it was felt patient stable for discharge home. Patient with continued restlessness and complaints of pain at home. Has been taking Norco and muscle relaxants. No new medications. Denies fever, chills, n/v/d, abd pain, dysuria or frequency, chest pain, dyspnea, numbness/weakness, bowel incontinence, urinary retention. Has had mild cough.   Past Medical History:  Diagnosis Date  . Anemia    has sickle cell trait  . Anxiety   . Arthritis   . Asthma    has used inhaler in past for asthmatic bronchitis, last time- early 2012  . Complication of anesthesia    wakes up shaking  . Diabetes mellitus   . Dyslipidemia   . Fibromyalgia   . GERD (gastroesophageal reflux disease)    occas. use of  Prilosec  . Heart murmur    sees Dr. Montez Morita, last seen- early 2012  . Hypertension    02/2010- stress test /w PCP  . Hypothyroidism   . Neuromuscular disorder (HCC)    lumbar radiculopathy, lumbago  . Nocturnal leg cramps 09/27/2014  . Pneumonia   . Sickle cell trait ([place])   . Sleep apnea    borderline sleep apnea,  states she no longer uses, early 2012- stopped using     Patient Active Problem List   Diagnosis Date Noted  . Lumbar radiculopathy 06/27/2016  . Hypothyroidism 06/26/2016  . Labile blood glucose   . Surgery, elective   . Post-operative pain   . Sickle cell trait ([place])   . Acute blood loss anemia   . History of back surgery   . AKI (acute kidney injury) ([place])   . Herniated nucleus pulposus, L2-3 06/25/2016  . Bilateral primary osteoarthritis of knee 03/26/2016  . Osteoarthritis, hand 03/26/2016  . Other insomnia 03/26/2016  . Memory disorder 02/16/2016  . Fibromyalgia 09/27/2014  . Nocturnal leg cramps 09/27/2014  . Neuropathy ([place]) 03/10/2014  . Allergic rhinitis 03/10/2014  . Osteoporosis 03/10/2014  . Spinal stenosis of lumbar region with radiculopathy 02/28/2014  . Type II or unspecified type diabetes mellitus without mention of complication, uncontrolled 07/08/2013  . Hypokalemia 11/02/2012  . Essential hypertension, benign 11/02/2012  . Diabetes mellitus with neuropathy ([place]) 10/29/2012  . Hyperlipidemia 10/29/2012  . Spondylolisthesis of lumbar region 03/19/2011  . Lumbar radicular pain 03/19/2011    Past Surgical History:  Procedure Laterality Date  . ABDOMINAL HYSTERECTOMY    . adb.cyst     ovarian cyst  . BACK SURGERY     2012, 2015 (3 total)  . COLONOSCOPY    . EYE SURGERY  macular degeneration treatment - injections  . OVARIAN CYST SURGERY      OB History    No data available       Home Medications    Prior to Admission medications   Medication Sig Start Date End Date Taking? Authorizing Provider  aspirin 81 MG EC tablet Take 1m by mouth once daily 01/16/15   Historical Provider, MD  baclofen (LIORESAL) 10 MG tablet Take 1 tablet (10 mg total) by mouth at bedtime. 07/03/16   DLavon PaganiniAngiulli, PA-C  calcium carbonate (TUMS EX) 750 MG chewable tablet Chew 1 tablet by mouth 2 (two) times daily as needed for heartburn (indigestion).    Historical  Provider, MD  Calcium-Magnesium-Zinc (CAL-MAG-ZINC PO) Take 1 tablet by mouth daily.    Historical Provider, MD  cetirizine (ZYRTEC) 10 MG tablet Take 10 mg by mouth daily.    Historical Provider, MD  diclofenac sodium (VOLTAREN) 1 % GEL Apply 4 g topically 4 (four) times daily. Voltaren Gel 3 grams to 3 large joints upto TID 3 TUBES with 3 refills Patient taking differently: Apply 4 g topically 2 (two) times daily as needed (muscle pain).  03/28/16 09/24/16  Naitik Panwala, PA-C  ezetimibe (ZETIA) 10 MG tablet TAKE 1 TABLET(10 MG) BY MOUTH DAILY 07/03/16   DLavon PaganiniAngiulli, PA-C  gabapentin (NEURONTIN) 300 MG capsule TAKE 1 CAPSULE(300 MG) BY MOUTH THREE TIMES DAILY 07/03/16   DLavon PaganiniAngiulli, PA-C  glimepiride (AMARYL) 2 MG tablet Take 1 tablet (2 mg total) by mouth daily with breakfast. 07/03/16   DLavon PaganiniAngiulli, PA-C  HYDROcodone-acetaminophen (NORCO/VICODIN) 5-325 MG tablet Take 1-2 tablets by mouth every 4 (four) hours as needed (breakthrough pain). 07/03/16   DLavon PaganiniAngiulli, PA-C  insulin aspart (NOVOLOG) 100 UNIT/ML FlexPen Inject 6 Units into the skin 3 (three) times daily with meals. 07/03/16   DLavon PaganiniAngiulli, PA-C  insulin aspart (NOVOLOG) 100 UNIT/ML injection Inject 6 Units into the skin 3 (three) times daily with meals. 07/03/16   DLavon PaganiniAngiulli, PA-C  insulin detemir (LEVEMIR) 100 UNIT/ML injection Inject 0.12 mLs (12 Units total) into the skin at bedtime. 07/03/16   DLavon PaganiniAngiulli, PA-C  Insulin Pen Needle (BD PEN NEEDLE NANO U/F) 32G X 4 MM MISC USE AS DIRECTED 07/03/16   DLavon PaganiniAngiulli, PA-C  levothyroxine (SYNTHROID, LEVOTHROID) 25 MCG tablet TAKE 1 TABLET(25 MCG) BY MOUTH DAILY BEFORE BREAKFAST 07/03/16   Daniel J Angiulli, PA-C  LYRICA 50 MG capsule Take 1 capsule (50 mg total) by mouth at bedtime as needed (pain). Reported on 07/06/2015 07/03/16   DLavon PaganiniAngiulli, PA-C  oxyCODONE-acetaminophen (PERCOCET) 5-325 MG tablet Take 1 tablet by mouth every 4 (four) hours as  needed. 07/13/16   JMerrily Pew MD  polyethylene glycol (MIRALAX / GLYCOLAX) packet Take 17 g by mouth daily as needed for mild constipation. 07/03/16   DLavon PaganiniAngiulli, PA-C  potassium chloride (K-DUR,KLOR-CON) 10 MEQ tablet Take 1 tablet (10 mEq total) by mouth daily. 07/03/16   DLavon PaganiniAngiulli, PA-C  PROAIR RESPICLICK 1224(90 Base) MCG/ACT AEPB Inhale 2 puffs into the lungs 3 (three) times daily as needed (shortness of breath).  05/29/16   Historical Provider, MD  saccharomyces boulardii (FLORASTOR) 250 MG capsule Take 1 capsule (250 mg total) by mouth 2 (two) times daily. 07/03/16   Daniel J Angiulli, PA-C  senna-docusate (SENOKOT-S) 8.6-50 MG tablet Take 1 tablet by mouth 2 (two) times daily. 07/03/16   DCathlyn Parsons PA-C  SYMBICORT 80-4.5 MCG/ACT inhaler Inhale 2 puffs into the lungs 2 (two) times daily as needed (shortness of breath).  05/29/16   Historical Provider, MD  valsartan-hydrochlorothiazide (DIOVAN-HCT) 320-12.5 MG tablet Take 1 tablet by mouth daily. 07/03/16   Lavon Paganini Angiulli, PA-C  vitamin B-12 (CYANOCOBALAMIN) 500 MCG tablet Take 500 mcg by mouth daily.    Historical Provider, MD  vitamin E 400 UNIT capsule Take 400 Units by mouth daily.    Historical Provider, MD    Family History Family History  Problem Relation Age of Onset  . Ovarian cancer Mother   . Cancer - Prostate Father   . Breast cancer Paternal Aunt   . Multiple myeloma Paternal Aunt   . Anesthesia problems Neg Hx   . Hypotension Neg Hx   . Malignant hyperthermia Neg Hx   . Pseudochol deficiency Neg Hx     Social History Social History  Substance Use Topics  . Smoking status: Former Smoker    Quit date: 03/15/1999  . Smokeless tobacco: Never Used  . Alcohol use Yes     Comment: wine /w dinner on occas.     Allergies   Betadine [povidone iodine]; Cymbalta [duloxetine hcl]; Adhesive [tape]; and Lipitor [atorvastatin]   Review of Systems Review of Systems  Constitutional: Negative for fever.    HENT: Negative for congestion.   Respiratory: Positive for cough. Negative for shortness of breath.   Cardiovascular: Negative for chest pain.  Gastrointestinal: Negative for abdominal pain, diarrhea, nausea and vomiting.  Genitourinary: Negative for difficulty urinating and dysuria.  Musculoskeletal: Positive for back pain.  Allergic/Immunologic: Negative for immunocompromised state.  Neurological: Negative for weakness and numbness.  Hematological: Does not bruise/bleed easily.  Psychiatric/Behavioral: Positive for confusion.  All other systems reviewed and are negative.    Physical Exam Updated Vital Signs BP (!) 150/81 (BP Location: Left Arm)   Pulse 84   Temp 98.5 F (36.9 C) (Oral)   Resp 18   SpO2 98%   Physical Exam Physical Exam  Nursing note and vitals reviewed. Constitutional:  non-toxic, and in no acute distress, appears altered with inappropriate answers to questions, flailing and trashing around the bed Head: Normocephalic and atraumatic.  Mouth/Throat: Oropharynx is clear and moist.  Neck: Normal range of motion. Neck supple.  Cardiovascular: Normal rate and regular rhythm.   Pulmonary/Chest: Effort normal and breath sounds normal.  Abdominal: Soft. There is no tenderness. There is no rebound and no guarding.  Musculoskeletal: No deformities. Normal ROm of all 4 extremities. Tenderness diffusely over TLS spine. No stepoffs Neurological: Alert, oriented to person, time, location, and president. Not fully oriented to situation, no facial droop, fluent speech, moves all extremities symmetrically, pupils pinpoint, EOMI, full strength in hand grip and ankle dorsi/plantar flexion, is flailing arms and legs symmetrically, sensation to light touch in tact throughout Skin: Skin is warm and dry.  Psychiatric: Cooperative   ED Treatments / Results  Labs (all labs ordered are listed, but only abnormal results are displayed) Labs Reviewed  URINALYSIS, ROUTINE W REFLEX  MICROSCOPIC - Abnormal; Notable for the following:       Result Value   Color, Urine STRAW (*)    All other components within normal limits  CBC - Abnormal; Notable for the following:    RBC 3.60 (*)    Hemoglobin 9.8 (*)    HCT 30.6 (*)    All other components within normal limits  COMPREHENSIVE METABOLIC PANEL - Abnormal; Notable for the following:  Glucose, Bld 117 (*)    Creatinine, Ser 1.01 (*)    GFR calc non Af Amer 53 (*)    All other components within normal limits  CBG MONITORING, ED - Abnormal; Notable for the following:    Glucose-Capillary 118 (*)    All other components within normal limits  CBG MONITORING, ED - Abnormal; Notable for the following:    Glucose-Capillary 123 (*)    All other components within normal limits  DIFFERENTIAL  RAPID URINE DRUG SCREEN, HOSP PERFORMED    EKG  EKG Interpretation None       Radiology Dg Chest 1 View  Result Date: 07/14/2016 CLINICAL DATA:  Acute onset of altered mental status. Upper back pain. Initial encounter. EXAM: CHEST 1 VIEW COMPARISON:  Chest radiograph performed 05/18/2016 FINDINGS: The lungs are well-aerated and clear. There is no evidence of focal opacification, pleural effusion or pneumothorax. The cardiomediastinal silhouette is borderline enlarged. No acute osseous abnormalities are seen. Lumbar spinal fusion hardware is noted. IMPRESSION: Borderline cardiomegaly.  Lungs remain grossly clear. Electronically Signed   By: Roanna Raider M.D.   On: 07/14/2016 23:53   Ct Head Wo Contrast  Result Date: 07/15/2016 CLINICAL DATA:  Acute onset of confusion.  Initial encounter. EXAM: CT HEAD WITHOUT CONTRAST TECHNIQUE: Contiguous axial images were obtained from the base of the skull through the vertex without intravenous contrast. COMPARISON:  MRI of the brain performed 08/01/2015, and CT of the head performed 06/16/2012 FINDINGS: Brain: No evidence of acute infarction, hemorrhage, hydrocephalus, extra-axial collection  or mass lesion/mass effect. Prominence of the ventricles and sulci reflects mild cortical volume loss. Mild periventricular white matter change likely reflects small vessel ischemic microangiopathy. The brainstem and fourth ventricle are within normal limits. The basal ganglia are unremarkable in appearance. The cerebral hemispheres demonstrate grossly normal gray-white differentiation. No mass effect or midline shift is seen. Vascular: No hyperdense vessel or unexpected calcification. Skull: There is no evidence of fracture; visualized osseous structures are unremarkable in appearance. Sinuses/Orbits: The visualized portions of the orbits are within normal limits. The paranasal sinuses and mastoid air cells are well-aerated. Other: No significant soft tissue abnormalities are seen. IMPRESSION: 1. No acute intracranial pathology seen on CT. 2. Mild cortical volume loss and scattered small vessel ischemic microangiopathy. Electronically Signed   By: Roanna Raider M.D.   On: 07/15/2016 00:27   Mr Thoracic Spine W Wo Contrast  Result Date: 07/13/2016 CLINICAL DATA:  Severe back pain. Recent lumbar fusion on 06/25/2016. EXAM: MRI THORACIC AND LUMBAR SPINE WITHOUT AND WITH CONTRAST TECHNIQUE: Multiplanar and multiecho pulse sequences of the thoracic and lumbar spine were obtained without and with intravenous contrast. CONTRAST:  34mL MULTIHANCE GADOBENATE DIMEGLUMINE 529 MG/ML IV SOLN COMPARISON:  Preoperative MRI 06/18/2016. Intraoperative radiographs 06/25/2016. Postoperative lumbar spine films 07/11/2016. FINDINGS: MRI THORACIC SPINE FINDINGS Alignment:  Physiologic. Vertebrae: No fracture, evidence of discitis, or bone lesion. Cord:  Normal signal and morphology. Paraspinal and other soft tissues: Negative. Disc levels: Shallow disc protrusions at RIGHT T7-8, centrally at T8-9, and centrally at T12-L1 do not result in significant spinal stenosis or cord compression. MRI LUMBAR SPINE FINDINGS Segmentation:   Standard Alignment: Anatomic except for 3 mm anterolisthesis L2 on L3, and 3 mm anterolisthesis L5 on S1. Vertebrae: There is an acute inferior endplate fracture of L2, with subsequent subsidence of the cage upward. There is loss of interspace height at L2-3 and anterior translation. Reference image 9 series 6. Mild postcontrast enhancement of L2. Conus medullaris: Extends  to the L1 level and appears normal. Paraspinal and other soft tissues: Uncomplicated appearing seroma at the site of laminectomy. Disc levels: L1-L2:  Normal. L2-L3: Status post posterolateral and interbody fusion. Cage subsidence due to endplate softening, upward into L2 with accompanying bone marrow edema. Associated loss of disc space height. 3 mm anterolisthesis. Enhancement in the ventral epidural space, up to 5 mm thick, not necessarily pathologic or unexpected given the large disc extrusion which was present preoperatively. Mild flattening of the thecal sac without significant spinal stenosis, see axial image 18 series 7. Presence or absence of L3 nerve root impingement difficult to establish. No features to strongly suggest postoperative infection. L3-L4:  Unremarkable post fusion interspace. L4-L5:  Unremarkable post fusion interspace. L5-S1: Fixed anterolisthesis status post posterior and interbody fusion. No impingement. Correlating the MR findings with plain films from 07/11/2016 CNSA, cage subsidence is confirmed. The appearance of the cage at L2-3 and the inferior endplate of L2 and is a change from the intraoperative films of 06/25/2016. IMPRESSION: Unremarkable thoracic spine MRI without and with contrast. Cage subsidence L2-3, status post PLIF 06/25/2016. Upward migration of the interbody cage into the inferior endplate of L2, with subsequent loss of intervertebral disc height, represents an interval change from the intraoperative radiographs. No features strongly suggestive of disc space infection. Mild enhancement of the ventral  epidural space at L2, not necessarily unexpected given the large disc extrusion present preoperatively. Electronically Signed   By: Staci Righter M.D.   On: 07/13/2016 12:41   Mr Lumbar Spine W Wo Contrast  Result Date: 07/13/2016 CLINICAL DATA:  Severe back pain. Recent lumbar fusion on 06/25/2016. EXAM: MRI THORACIC AND LUMBAR SPINE WITHOUT AND WITH CONTRAST TECHNIQUE: Multiplanar and multiecho pulse sequences of the thoracic and lumbar spine were obtained without and with intravenous contrast. CONTRAST:  68m MULTIHANCE GADOBENATE DIMEGLUMINE 529 MG/ML IV SOLN COMPARISON:  Preoperative MRI 06/18/2016. Intraoperative radiographs 06/25/2016. Postoperative lumbar spine films 07/11/2016. FINDINGS: MRI THORACIC SPINE FINDINGS Alignment:  Physiologic. Vertebrae: No fracture, evidence of discitis, or bone lesion. Cord:  Normal signal and morphology. Paraspinal and other soft tissues: Negative. Disc levels: Shallow disc protrusions at RIGHT T7-8, centrally at T8-9, and centrally at T12-L1 do not result in significant spinal stenosis or cord compression. MRI LUMBAR SPINE FINDINGS Segmentation:  Standard Alignment: Anatomic except for 3 mm anterolisthesis L2 on L3, and 3 mm anterolisthesis L5 on S1. Vertebrae: There is an acute inferior endplate fracture of L2, with subsequent subsidence of the cage upward. There is loss of interspace height at L2-3 and anterior translation. Reference image 9 series 6. Mild postcontrast enhancement of L2. Conus medullaris: Extends to the L1 level and appears normal. Paraspinal and other soft tissues: Uncomplicated appearing seroma at the site of laminectomy. Disc levels: L1-L2:  Normal. L2-L3: Status post posterolateral and interbody fusion. Cage subsidence due to endplate softening, upward into L2 with accompanying bone marrow edema. Associated loss of disc space height. 3 mm anterolisthesis. Enhancement in the ventral epidural space, up to 5 mm thick, not necessarily pathologic or  unexpected given the large disc extrusion which was present preoperatively. Mild flattening of the thecal sac without significant spinal stenosis, see axial image 18 series 7. Presence or absence of L3 nerve root impingement difficult to establish. No features to strongly suggest postoperative infection. L3-L4:  Unremarkable post fusion interspace. L4-L5:  Unremarkable post fusion interspace. L5-S1: Fixed anterolisthesis status post posterior and interbody fusion. No impingement. Correlating the MR findings with plain films from 07/11/2016  CNSA, cage subsidence is confirmed. The appearance of the cage at L2-3 and the inferior endplate of L2 and is a change from the intraoperative films of 06/25/2016. IMPRESSION: Unremarkable thoracic spine MRI without and with contrast. Cage subsidence L2-3, status post PLIF 06/25/2016. Upward migration of the interbody cage into the inferior endplate of L2, with subsequent loss of intervertebral disc height, represents an interval change from the intraoperative radiographs. No features strongly suggestive of disc space infection. Mild enhancement of the ventral epidural space at L2, not necessarily unexpected given the large disc extrusion present preoperatively. Electronically Signed   By: Staci Righter M.D.   On: 07/13/2016 12:41    Procedures Procedures (including critical care time)  Medications Ordered in ED Medications  LORazepam (ATIVAN) injection 1 mg (1 mg Intravenous Given 07/14/16 2254)     Initial Impression / Assessment and Plan / ED Course  I have reviewed the triage vital signs and the nursing notes.  Pertinent labs & imaging results that were available during my care of the patient were reviewed by me and considered in my medical decision making (see chart for details).     76 year old female who presents with changes in mental status. Vitals stable on presentation. No focal neurological deficits. Will answer some questions appropriately other  questions she is slow to respond or answers inappropriately. She is flailing all of her extremities around.    I reviewed the available records in [facility] and also Care Everywhere, updated the chart. She had MRI thoracic and lumbar spine performed yesterday, which was overall unremarkable. Unlikely to be related to her back.  ? Potential delirium from medications. Endorses only taking Norco and muscle relaxants once today. Her neighbor states that there was initially some concern that she was taking her pain medications and may have been doubling up on pain medications recently.   Blood work reassuring. No UTI. UDS pending. CXR visualized and without pneumonia or other acute cardiopulmonary processes. Given mental status changes will plan for admission for observation.   Final Clinical Impressions(s) / ED Diagnoses   Final diagnoses:  Delirium    New Prescriptions New Prescriptions   No medications on file     Forde Dandy, MD 07/15/16 [phone]

## 2016-07-15 NOTE — ED Notes (Signed)
Admitting provider at bedside.

## 2016-07-15 NOTE — Progress Notes (Signed)
EEG completed; results pending.    

## 2016-07-15 NOTE — ED Notes (Signed)
Pt refusing to have ABG drawn, states "you've already done that". Pt becoming more agitated. Dr. Hal Hope paged, verbal order to discontinue ABG at this time.

## 2016-07-15 NOTE — Progress Notes (Signed)
Patient ID: [name], female   DOB: 06-17-1940, 76 y.o.   MRN: [id number] Ms. [name] seems more awake today Jerking motions are not apparent currently She states she feels reasonably comfortably Nonetheless she's been concerned about the degree of pain that she has been having I feel that she may best benefit from a skilled nursing facility or rehabilitation as opposed to going home this time I'll discuss this w the hospitalist

## 2016-07-15 NOTE — Procedures (Signed)
ELECTROENCEPHALOGRAM REPORT  Date of Study: 07/15/2016  Patient's Name: [name] MRN: [id number] Date of Birth: 1940-05-05  Referring Provider: Rise Patience, MD  Clinical History: 76 year old woman with recent lumbar decompression surgery presents with 2 days of increasing pain and confusion with myoclonic jerks.  Medications: acetaminophen (TYLENOL) suppository 650 mg  L1 acetaminophen (TYLENOL) tablet 650 mg   gabapentin (NEURONTIN) capsule 300 mg   hydrALAZINE (APRESOLINE) injection 10 mg   insulin aspart (novoLOG) injection 0-9 Units   levothyroxine (SYNTHROID, LEVOTHROID) injection 12.5 mcg   mometasone-formoterol (DULERA) 100-5 MCG/ACT inhaler 2 puff   oxyCODONE-acetaminophen (PERCOCET/ROXICET) 5-325 MG per tablet 1 tablet    Technical Summary: A multichannel digital EEG recording measured by the international 10-20 system with electrodes applied with paste and impedances below 5000 ohms performed as portable with EKG monitoring in an awake and drowsy confused patient.  Hyperventilation and photic stimulation were not performed.  The digital EEG was referentially recorded, reformatted, and digitally filtered in a variety of bipolar and referential montages for optimal display.   Description: The patient is awake and confused during the recording.  During maximal wakefulness, there is a symmetric, medium voltage 8 Hz posterior dominant rhythm that attenuates with eye opening. This is admixed with diffuse 4-5 Hz theta and 2-3 Hz delta slowing of the waking background.  During drowsiness, there is an increase in theta slowing of the background.  Vertex waves and symmetric sleep spindles were not seen. There were no epileptiform discharges or electrographic seizures seen.    EKG lead was unremarkable.  Impression: This awake and drowsy EEG is abnormal due to diffuse slowing of the waking background.  Clinical Correlation of the above findings indicates diffuse  cerebral dysfunction that is non-specific in etiology and can be seen with hypoxic/ischemic injury, toxic/metabolic encephalopathies, neurodegenerative disorders, or medication effect.  The absence of epileptiform discharges does not rule out a clinical diagnosis of epilepsy.  Clinical correlation is advised.   Metta Clines, DO

## 2016-07-16 LAB — CBC
HCT: 30.8 % — ABNORMAL LOW (ref 36.0–46.0)
HEMOGLOBIN: 9.8 g/dL — AB (ref 12.0–15.0)
MCH: 27.1 pg (ref 26.0–34.0)
MCHC: 31.8 g/dL (ref 30.0–36.0)
MCV: 85.3 fL (ref 78.0–100.0)
Platelets: 280 10*3/uL (ref 150–400)
RBC: 3.61 MIL/uL — AB (ref 3.87–5.11)
RDW: 15.7 % — ABNORMAL HIGH (ref 11.5–15.5)
WBC: 4.8 10*3/uL (ref 4.0–10.5)

## 2016-07-16 LAB — GLUCOSE, CAPILLARY
GLUCOSE-CAPILLARY: 98 mg/dL (ref 65–99)
GLUCOSE-CAPILLARY: 110 mg/dL — AB (ref 65–99)
GLUCOSE-CAPILLARY: 146 mg/dL — AB (ref 65–99)
GLUCOSE-CAPILLARY: 206 mg/dL — AB (ref 65–99)
GLUCOSE-CAPILLARY: 163 mg/dL — AB (ref 65–99)

## 2016-07-16 LAB — COMPREHENSIVE METABOLIC PANEL
ALBUMIN: 3.3 g/dL — AB (ref 3.5–5.0)
ALT: 15 U/L (ref 14–54)
ANION GAP: 8 (ref 5–15)
AST: 24 U/L (ref 15–41)
Alkaline Phosphatase: 77 U/L (ref 38–126)
BILIRUBIN TOTAL: 0.7 mg/dL (ref 0.3–1.2)
BUN: 7 mg/dL (ref 6–20)
CALCIUM: 8.9 mg/dL (ref 8.9–10.3)
CO2: 26 mmol/L (ref 22–32)
Chloride: 108 mmol/L (ref 101–111)
Creatinine, Ser: 0.81 mg/dL (ref 0.44–1.00)
GFR calc Af Amer: >60 mL/min (ref >60)
GFR calc non Af Amer: >60 mL/min (ref >60)
Glucose, Bld: 104 mg/dL — ABNORMAL HIGH (ref 65–99)
POTASSIUM: 3.4 mmol/L — AB (ref 3.5–5.1)
SODIUM: 142 mmol/L (ref 135–145)
Total Protein: 5.9 g/dL — ABNORMAL LOW (ref 6.5–8.1)

## 2016-07-16 MED ORDER — HYDROCODONE-ACETAMINOPHEN 5-325 MG PO TABS
1.0000 | ORAL_TABLET | Freq: Four times a day (QID) | ORAL | Status: DC | PRN
  Administered 2016-07-17 – 2016-07-18 (×2): 1 via ORAL
  Filled 2016-07-16 (×3): qty 1

## 2016-07-16 NOTE — Progress Notes (Signed)
[facility] TEAM 1 - Stepdown/ICU TEAM  [name]  ZOX:[id number] DOB: 09-07-1940 DOA: 07/14/2016 PCP: Wenda Low, MD    Brief Narrative:  76 y.o. female with history of diabetes mellitus, hypothyroidism, hypertension and chronic anemia who recently had lumbar decompression surgery was brought to the ER after found to be increasingly confused. Patient was also brought to the ER 2 days prior complaining of increasing pain and also appearing to be confused. At that time MRI of the T-spine and L-spine done did not show anything acute and patient was discharged home. At home the pt was found to be confused again and also had some myoclonic jerks. After contacting her Neurosurgeon (Dr. Ellene Route) the patient was brought to the ER.   In the ER the patient was found to be agitated and was given Ativan. CT head was unremarkable.  Subjective: Patient states she still feels minor jerking movements but much improved than before. She does tell me that this morning when she received a Percocet she became really drowsy and was unable to formulate words really well therefore would like to use Norco instead she has tolerated that in the past. At this time she does not have any chest pain shortness of breath fevers chills and other complaints.  Assessment & Plan:  Acute delirium/encephalopathy-improved -Restart her home medications slowly. -Neurosurgery team following. Recommends physical therapy otherwise no further intervention.  DM 2 -Accu-Cheks, insulin sliding scale  Hypokalemia  -Improved, it set 3.4 today.  Hypothyroidism -Continue home Synthroid.  HTN -Slowly restart her home medications as deemed necessary. She is on Diovan at home.  Normocytic anemia -No iron  studies available to categorize anemia. We will continue to monitor hemoglobin at this time otherwise no further workup needed.  DVT prophylaxis: SCDs Code Status: FULL CODE Family Communication: no family present at time of  exam . Patient comprehends her condition well  Disposition Plan: She needs physical therapy and likely skilled nursing facility placement. I will transfer her out to medical for as she is stable.  Consultants:  None   Procedures: 3/26 EEG - diffuse slowing   Antimicrobials:  None  Objective: Blood pressure (!) 160/75, pulse 80, temperature 98.7 F (37.1 C), temperature source Oral, resp. rate 16, height 5\' 6"  (1.676 m), weight 80.8 kg (178 lb 3.2 oz), SpO2 97 %.  Intake/Output Summary (Last 24 hours) at 07/16/16 1438 Last data filed at 07/16/16 1300  Gross per 24 hour  Intake              450 ml  Output                4 ml  Net              446 ml   Filed Weights   07/15/16 0524  Weight: 80.8 kg (178 lb 3.2 oz)    Examination: Pt was seen for a f/u visit.    CBC:  Recent Labs Lab 07/13/16 0912 07/14/16 2208 07/15/16 0323 07/16/16 0211  WBC 7.2 7.3 6.9 4.8  NEUTROABS 5.4 4.6 5.1  --   HGB 9.8* 9.8* 9.3* 9.8*  HCT 30.7* 30.6* 29.1* 30.8*  MCV 84.8 85.0 84.8 85.3  PLT 332 317 325 811   Basic Metabolic Panel:  Recent Labs Lab 07/13/16 0912 07/14/16 2208 07/15/16 0323 07/16/16 0211  NA 142 141 141 142  K 4.5 3.8 3.1* 3.4*  CL 103 104 105 108  CO2 30 25 26 26   GLUCOSE 197* 117* 112* 104*  BUN 20 13 12 7   CREATININE 1.12* 1.01* 0.89 0.81  CALCIUM 9.5 9.4 9.1 8.9   GFR: Estimated Creatinine Clearance: 63.3 mL/min (by C-G formula based on SCr of 0.81 mg/dL).  Liver Function Tests:  Recent Labs Lab 07/13/16 0912 07/14/16 2208 07/15/16 0323 07/16/16 0211  AST 24 28 27 24   ALT 13* 17 16 15   ALKPHOS 78 77 68 77  BILITOT 0.6 0.8 0.8 0.7  PROT 6.5 6.9 6.1* 5.9*  ALBUMIN 3.7 3.8 3.5 3.3*    Recent Labs Lab 07/15/16 0142  AMMONIA 27    HbA1C: Hgb A1c MFr Bld  Date/Time Value Ref Range Status  06/26/2016 12:00 PM 7.8 (H) 4.8 - 5.6 % Final    Comment:    (NOTE)         Pre-diabetes: 5.7 - 6.4         Diabetes: >6.4         Glycemic control  for adults with diabetes: <7.0   05/24/2016 10:19 AM 8.1 (H) 4.6 - 6.5 % Final    Comment:    Glycemic Control Guidelines for People with Diabetes:Non Diabetic:  <6%Goal of Therapy: <7%Additional Action Suggested:  >8%     CBG:  Recent Labs Lab 07/15/16 1928 07/15/16 2340 07/16/16 0349 07/16/16 0740 07/16/16 1217  GLUCAP 184* 118* 98 110* 146*    Recent Results (from the past 240 hour(s))  MRSA PCR Screening     Status: None   Collection Time: 07/15/16  5:27 AM  Result Value Ref Range Status   MRSA by PCR NEGATIVE NEGATIVE Final    Comment:        The GeneXpert MRSA Assay (FDA approved for NASAL specimens only), is one component of a comprehensive MRSA colonization surveillance program. It is not intended to diagnose MRSA infection nor to guide or monitor treatment for MRSA infections.      Scheduled Meds: . aspirin EC  81 mg Oral Daily  . gabapentin  100 mg Oral TID  . insulin aspart  0-9 Units Subcutaneous Q4H  . levothyroxine  25 mcg Oral QAC breakfast  . mometasone-formoterol  2 puff Inhalation BID  . senna-docusate  1 tablet Oral BID   Continuous Infusions: . sodium chloride 60 mL/hr at 07/16/16 1300     LOS: 1 day   Time spent: No Charge  Gerlean Ren, MD [facility] Office  [phone] Pager - Text Page per Shea Evans as per below:  On-Call/Text Page:      [email or web address]      password [email or web address]  If 7PM-7AM, please contact night-coverage [email or web address] Password [email or web address] 07/16/2016, 2:38 PM

## 2016-07-16 NOTE — Progress Notes (Addendum)
Report given to Santiago Glad RN via phone.   2037: pt transported to 5W by [facility], pt in no acute or respiratory distress prior to transfer

## 2016-07-16 NOTE — Progress Notes (Signed)
Attempted to call report, was told RN was still receiving report and would call back, name and number given to return call

## 2016-07-16 NOTE — Progress Notes (Signed)
[facility]  Patient Status: Active (receiving services up to time of hospitalization)  [facility] is providing the following services: PT  If patient discharges after hours, please call [phone].   Janae Sauce 07/16/2016, 4:54 PM

## 2016-07-16 NOTE — Progress Notes (Signed)
Noted with fine jerky movement of the body, lasted for few.min. MD made aware. Continue to monitor.

## 2016-07-17 DIAGNOSIS — R110 Nausea: Secondary | ICD-10-CM

## 2016-07-17 DIAGNOSIS — E039 Hypothyroidism, unspecified: Secondary | ICD-10-CM

## 2016-07-17 LAB — GLUCOSE, CAPILLARY
GLUCOSE-CAPILLARY: 197 mg/dL — AB (ref 65–99)
Glucose-Capillary: 161 mg/dL — ABNORMAL HIGH (ref 65–99)
Glucose-Capillary: 162 mg/dL — ABNORMAL HIGH (ref 65–99)

## 2016-07-17 LAB — AMMONIA: AMMONIA: 31 umol/L (ref 9–35)

## 2016-07-17 LAB — TSH: TSH: 1.437 u[IU]/mL (ref 0.350–4.500)

## 2016-07-17 LAB — HEPATIC FUNCTION PANEL
ALBUMIN: 3.7 g/dL (ref 3.5–5.0)
ALT: 16 U/L (ref 14–54)
AST: 19 U/L (ref 15–41)
Alkaline Phosphatase: 83 U/L (ref 38–126)
BILIRUBIN DIRECT: 0.1 mg/dL (ref 0.1–0.5)
Indirect Bilirubin: 0.3 mg/dL (ref 0.3–0.9)
TOTAL PROTEIN: 6.5 g/dL (ref 6.5–8.1)
Total Bilirubin: 0.4 mg/dL (ref 0.3–1.2)

## 2016-07-17 LAB — HEMOGLOBIN A1C
HEMOGLOBIN A1C: 7 % — AB (ref 4.8–5.6)
Mean Plasma Glucose: 154 mg/dL

## 2016-07-17 MED ORDER — IRBESARTAN 300 MG PO TABS
150.0000 mg | ORAL_TABLET | Freq: Every day | ORAL | Status: DC
  Administered 2016-07-17: 150 mg via ORAL
  Filled 2016-07-17: qty 1

## 2016-07-17 MED ORDER — ZOLPIDEM TARTRATE 5 MG PO TABS
5.0000 mg | ORAL_TABLET | Freq: Every evening | ORAL | Status: DC | PRN
  Administered 2016-07-17 – 2016-07-18 (×2): 5 mg via ORAL
  Filled 2016-07-17 (×2): qty 1

## 2016-07-17 MED ORDER — ONDANSETRON HCL 4 MG/2ML IJ SOLN
4.0000 mg | Freq: Four times a day (QID) | INTRAMUSCULAR | Status: DC | PRN
  Administered 2016-07-17 – 2016-07-18 (×2): 4 mg via INTRAVENOUS
  Filled 2016-07-17 (×3): qty 2

## 2016-07-17 MED ORDER — POTASSIUM CHLORIDE 20 MEQ/15ML (10%) PO SOLN
20.0000 meq | Freq: Every day | ORAL | Status: DC
  Administered 2016-07-17: 20 meq via ORAL
  Filled 2016-07-17 (×2): qty 15

## 2016-07-17 MED ORDER — INSULIN ASPART 100 UNIT/ML ~~LOC~~ SOLN
0.0000 [IU] | Freq: Three times a day (TID) | SUBCUTANEOUS | Status: DC
  Administered 2016-07-18 (×2): 2 [IU] via SUBCUTANEOUS
  Administered 2016-07-18: 1 [IU] via SUBCUTANEOUS
  Administered 2016-07-19: 2 [IU] via SUBCUTANEOUS
  Administered 2016-07-19: 1 [IU] via SUBCUTANEOUS

## 2016-07-17 NOTE — Clinical Social Work Note (Signed)
Received SNF consult for patient. Awaiting PT/OT eval.  Murl Golladay Givens, MSW, LCSW Licensed Clinical Social Worker [place] [phone]

## 2016-07-17 NOTE — Consult Note (Signed)
NEURO HOSPITALIST CONSULT NOTE   Requestig physician: Dr. Carolin Sicks   Reason for Consult:Mild clonus   History obtained from:  Patient     HPI:                                                                                                                                          [name] is an 76 y.o. female who underwent back surgery back on March 13. Patient was doing fine at that time and underwent inpatient rehabilitation. Apparently she was sent home on Percocet and baclofen which was not on her normal medication rec list. After getting home she noted that she was having jerking spells of both upper and lower extremities. This concerned her very much and was brought back to the hospital. Patient underwent an EEG which showed no epileptiform activity. CT of brain was also obtained which showed no acute infarction, hemorrhage, hydrocephalus. Patient tells me that her myoclonic jerks which appear per her description to be polymyoclonus have subsided significantly to almost disappeared. Patient and friend are very concerned about what could have caused this. At this time during consultation I did not know any abnormal movements.  Past Medical History:  Diagnosis Date  . Anemia    has sickle cell trait  . Anxiety   . Arthritis   . Asthma    has used inhaler in past for asthmatic bronchitis, last time- early 2012  . Complication of anesthesia    wakes up shaking  . Diabetes mellitus   . Dyslipidemia   . Fibromyalgia   . GERD (gastroesophageal reflux disease)    occas. use of  Prilosec  . Heart murmur    sees Dr. Montez Morita, last seen- early 2012  . Hypertension    02/2010- stress test /w PCP  . Hypothyroidism   . Neuromuscular disorder (HCC)    lumbar radiculopathy, lumbago  . Nocturnal leg cramps 09/27/2014  . Pneumonia   . Sickle cell trait ([place])   . Sleep apnea    borderline sleep apnea, states she no longer uses, early 2012- stopped using     Past  Surgical History:  Procedure Laterality Date  . ABDOMINAL HYSTERECTOMY    . adb.cyst     ovarian cyst  . BACK SURGERY     2012, 2015 (3 total)  . COLONOSCOPY    . EYE SURGERY     macular degeneration treatment - injections  . OVARIAN CYST SURGERY      Family History  Problem Relation Age of Onset  . Ovarian cancer Mother   . Cancer - Prostate Father   . Breast cancer Paternal Aunt   . Multiple myeloma Paternal Aunt   . Anesthesia problems Neg Hx   . Hypotension Neg Hx   .  Malignant hyperthermia Neg Hx   . Pseudochol deficiency Neg Hx      Social History:  reports that she quit smoking about 17 years ago. She has never used smokeless tobacco. She reports that she drinks alcohol. She reports that she does not use drugs.  Allergies  Allergen Reactions  . Betadine [Povidone Iodine] Swelling and Other (See Comments)    Reaction to betadine eye drops  . Cymbalta [Duloxetine Hcl] Other (See Comments)    Dizziness   . Adhesive [Tape] Rash  . Lipitor [Atorvastatin] Rash    MEDICATIONS:                                                                                                                     Scheduled: . aspirin EC  81 mg Oral Daily  . gabapentin  100 mg Oral TID  . insulin aspart  0-9 Units Subcutaneous Q4H  . irbesartan  150 mg Oral Daily  . levothyroxine  25 mcg Oral QAC breakfast  . mometasone-formoterol  2 puff Inhalation BID  . potassium chloride  20 mEq Oral Daily  . senna-docusate  1 tablet Oral BID     ROS:                                                                                                                                       History obtained from the patient  General ROS: negative for - chills, fatigue, fever, night sweats, weight gain or weight loss Psychological ROS: negative for - behavioral disorder, hallucinations, memory difficulties, mood swings or suicidal ideation Ophthalmic ROS: negative for - blurry vision, double vision, eye  pain or loss of vision ENT ROS: negative for - epistaxis, nasal discharge, oral lesions, sore throat, tinnitus or vertigo Allergy and Immunology ROS: negative for - hives or itchy/watery eyes Hematological and Lymphatic ROS: negative for - bleeding problems, bruising or swollen lymph nodes Endocrine ROS: negative for - galactorrhea, hair pattern changes, polydipsia/polyuria or temperature intolerance Respiratory ROS: negative for - cough, hemoptysis, shortness of breath or wheezing Cardiovascular ROS: negative for - chest pain, dyspnea on exertion, edema or irregular heartbeat Gastrointestinal ROS: negative for - abdominal pain, diarrhea, hematemesis, nausea/vomiting or stool incontinence Genito-Urinary ROS: negative for - dysuria, hematuria, incontinence or urinary frequency/urgency Musculoskeletal ROS: negative for - joint swelling or muscular weakness Neurological ROS: as noted in HPI Dermatological ROS: negative for rash and skin lesion  changes   Blood pressure (!) 152/77, pulse 85, temperature 98.8 F (37.1 C), temperature source Oral, resp. rate 16, height '5\' 6"'  (1.676 m), weight 80.8 kg (178 lb 3.2 oz), SpO2 96 %.   Neurologic Examination:                                                                                                      HEENT-  Normocephalic, no lesions, without obvious abnormality.  Normal external eye and conjunctiva.  Normal TM's bilaterally.  Normal auditory canals and external ears. Normal external nose, mucus membranes and septum.  Normal pharynx. Cardiovascular- S1, S2 normal, pulses palpable throughout   Lungs- no tachypnea, retractions or cyanosis Abdomen- normal findings: bowel sounds normal Extremities- no edema Lymph-no adenopathy palpable Musculoskeletal-no joint tenderness, deformity or swelling Skin-warm and dry, no hyperpigmentation, vitiligo, or suspicious lesions  Neurological Examination Mental Status: Alert, oriented, thought content  appropriate.  Speech fluent without evidence of aphasia.  Able to follow 3 step commands without difficulty. Cranial Nerves: II: Visual fields grossly normal, pupils equal, round, reactive to light and accommodation III,IV, VI: ptosis not present, extra-ocular motions intact bilaterally V,VII: smile symmetric, facial light touch sensation normal bilaterally VIII: hearing normal bilaterally IX,X: uvula rises symmetrically XI: bilateral shoulder shrug XII: midline tongue extension Motor: Right : Upper extremity   5/5    Left:     Upper extremity   5/5  Lower extremity   4/5     Lower extremity   4/5 --No polymyoclonus, asterixis, myoclonus, significant tremor was noted. Tone and bulk:normal tone throughout; no atrophy noted Sensory: Pinprick and light touch intact throughout, bilaterally Deep Tendon Reflexes: 2+ and symmetric throughout Plantars: Right: downgoing   Left: downgoing Cerebellar: normal finger-to-nose, Gait: Not tested      Lab Results: Basic Metabolic Panel:  Recent Labs Lab 07/13/16 0912 07/14/16 2208 07/15/16 0323 07/16/16 0211  NA 142 141 141 142  K 4.5 3.8 3.1* 3.4*  CL 103 104 105 108  CO2 '30 25 26 26  ' GLUCOSE 197* 117* 112* 104*  BUN '20 13 12 7  ' CREATININE 1.12* 1.01* 0.89 0.81  CALCIUM 9.5 9.4 9.1 8.9    Liver Function Tests:  Recent Labs Lab 07/13/16 0912 07/14/16 2208 07/15/16 0323 07/16/16 0211  AST '24 28 27 24  ' ALT 13* '17 16 15  ' ALKPHOS 78 77 68 77  BILITOT 0.6 0.8 0.8 0.7  PROT 6.5 6.9 6.1* 5.9*  ALBUMIN 3.7 3.8 3.5 3.3*   No results for input(s): LIPASE, AMYLASE in the last 168 hours.  Recent Labs Lab 07/15/16 0142  AMMONIA 27    CBC:  Recent Labs Lab 07/13/16 0912 07/14/16 2208 07/15/16 0323 07/16/16 0211  WBC 7.2 7.3 6.9 4.8  NEUTROABS 5.4 4.6 5.1  --   HGB 9.8* 9.8* 9.3* 9.8*  HCT 30.7* 30.6* 29.1* 30.8*  MCV 84.8 85.0 84.8 85.3  PLT 332 317 325 280    Cardiac Enzymes: No results for input(s): CKTOTAL,  CKMB, CKMBINDEX, TROPONINI in the last 168 hours.  Lipid Panel: No results for input(s): CHOL,  TRIG, HDL, CHOLHDL, VLDL, LDLCALC in the last 168 hours.  CBG:  Recent Labs Lab 07/16/16 1217 07/16/16 1707 07/16/16 1945 07/17/16 0808 07/17/16 1225  GLUCAP 146* 206* 163* 161* 197*    Microbiology: Results for orders placed or performed during the hospital encounter of 07/14/16  MRSA PCR Screening     Status: None   Collection Time: 07/15/16  5:27 AM  Result Value Ref Range Status   MRSA by PCR NEGATIVE NEGATIVE Final    Comment:        The GeneXpert MRSA Assay (FDA approved for NASAL specimens only), is one component of a comprehensive MRSA colonization surveillance program. It is not intended to diagnose MRSA infection nor to guide or monitor treatment for MRSA infections.     Coagulation Studies: No results for input(s): LABPROT, INR in the last 72 hours.  Imaging: No results found.     Assessment and plan per attending neurologist  Etta Quill PA-C [facility] [phone]  07/17/2016, 3:59 PM   Assessment/Plan: This 76 year old female presenting to the hospital with what is described as polymyoclonus versus asterixis. Medications the patient was placed on on discharge from CIR was baclofen and Percocet And hydrocodone. I suspect that she developed myoclonus secondary to narcotic use, and the fact that it is improving is reassuring. It could take some time for this to resolve, but I don't think any further testing is likely to be beneficial at this time.  1) avoid narcotic use of possible 2) no further recommend addition of this time, neurology will sign off.  Roland Rack, MD [facility] [phone]  If 7pm- 7am, please page neurology on call as listed in [place].

## 2016-07-17 NOTE — Evaluation (Addendum)
Physical Therapy Evaluation Patient Details Name: [name] MRN: [id number] DOB: 19-May-1940 Today's Date: 07/17/2016   History of Present Illness  [name] is a 76 y.o. female with history of diabetes mellitus, hypothyroidism, hypertension and chronic anemia who recently had lumbar decompression surgery was brought to the ER after patient was found to be increasingly confused with myoclonic jerking.  Clinical Impression  Pt admitted with/for above complications that are hopefully resolving slowly.  Pt is at at min guard to supervision level.  Pt currently limited functionally due to the problems listed below.  (see problems list.)  Pt will benefit from PT to maximize function and safety to be able to get home safely with available assist.      Follow Up Recommendations Home health PT;Other (comment) (up to 24 hour assist in the short term)    Equipment Recommendations  None recommended by PT    Recommendations for Other Services       Precautions / Restrictions Precautions Precautions: Fall;Back Precaution Booklet Issued: No      Mobility  Bed Mobility Overal bed mobility: Needs Assistance Bed Mobility: Sidelying to Sit;Sit to Sidelying   Sidelying to sit: Supervision     Sit to sidelying: Supervision General bed mobility comments: cues for a little better technique  Transfers Overall transfer level: Needs assistance Equipment used: Rolling walker (2 wheeled) Transfers: Sit to/from Stand Sit to Stand: Supervision         General transfer comment: slower and guarded  Ambulation/Gait Ambulation/Gait assistance: Min guard Ambulation Distance (Feet): 25 Feet Assistive device: None Gait Pattern/deviations: Step-through pattern   Gait velocity interpretation: Below normal speed for age/gender General Gait Details: slow, tentative steps.  Generally steady, but pt not confident and feels fatigued and jerky  Stairs            Wheelchair  Mobility    Modified Rankin (Stroke Patients Only)       Balance Overall balance assessment: Needs assistance Sitting-balance support: Feet supported;No upper extremity supported Sitting balance-Leahy Scale: Fair     Standing balance support: No upper extremity supported Standing balance-Leahy Scale: Fair                               Pertinent Vitals/Pain Pain Assessment: Faces Faces Pain Scale: Hurts a little bit Pain Location: vague Pain Descriptors / Indicators: Grimacing Pain Intervention(s): Monitored during session    Home Living Family/patient expects to be discharged to:: Unsure Living Arrangements: Alone Available Help at Discharge: Friend(s);Family;Available PRN/intermittently (sister likely has to return home soon.  ) Type of Home: House Home Access: Stairs to enter Entrance Stairs-Rails: Psychiatric nurse of Steps: 2 Home Layout: Two level;Able to live on main level with bedroom/bathroom Home Equipment: Walker - 4 wheels      Prior Function Level of Independence: Independent (since returning from [place])               Hand Dominance   Dominant Hand: Right    Extremity/Trunk Assessment   Upper Extremity Assessment Upper Extremity Assessment: Overall WFL for tasks assessed    Lower Extremity Assessment Lower Extremity Assessment: Overall WFL for tasks assessed;Generalized weakness (pt reports feeling jerky all over, but not overtly noticeabl)       Communication   Communication: No difficulties  Cognition Arousal/Alertness: Awake/alert Behavior During Therapy: WFL for tasks assessed/performed Overall Cognitive Status: Within Functional Limits for tasks assessed  General Comments      Exercises     Assessment/Plan    PT Assessment Patient needs continued PT services  PT Problem List Decreased strength;Decreased activity tolerance;Decreased  balance;Decreased mobility       PT Treatment Interventions DME instruction;Gait training;Functional mobility training;Stair training;Therapeutic activities;Patient/family education;Balance training    PT Goals (Current goals can be found in the Care Plan section)  Acute Rehab PT Goals Patient Stated Goal: to go home even if I have to get help PT Goal Formulation: With patient Time For Goal Achievement: 07/24/16 Potential to Achieve Goals: Good    Frequency Min 3X/week   Barriers to discharge Decreased caregiver support      Co-evaluation               End of Session   Activity Tolerance: Patient tolerated treatment well Patient left: in bed;with family/visitor present;with call bell/phone within reach Nurse Communication: Mobility status PT Visit Diagnosis: Other abnormalities of gait and mobility (R26.89)    Time: 0931-1216 PT Time Calculation (min) (ACUTE ONLY): 51 min   Charges:   PT Evaluation $PT Eval Moderate Complexity: 1 Procedure PT Treatments $Gait Training: 8-22 mins $Therapeutic Activity: 8-22 mins   PT G Codes:        Aug 07, 2016  Donnella Sham, PT [phone]  (pager)  Tessie Fass Delanna Blacketer 07-Aug-2016, 4:51 PM

## 2016-07-17 NOTE — Care Management Note (Signed)
Case Management Note  Patient Details  Name: [name] MRN: [id number] Date of Birth: Nov 15, 1940 . Subjective/Objective:            Admitted with acute encephalopathy,  hx of  diabetes mellitus,hypothyroidism, hypertension and chronic anemia, multiple back surgeries, recent  lumbar decompression surgery 2 weeks ago. Pt lives alone. Fraser Din (sister) states pt is depressed. Daughter (sickle cell pt) died 2 yrs ago here @ [facility].   Shon Hough (Sister) Salvadore Farber (Sister)     [phone]     PCP: Wenda Low  Action/Plan: PT/OT evaluations pending..... CM to f/u with disposition needs[facility]  Expected Discharge Date:                  Expected Discharge Plan:  [place]  In-House Referral:  Clinical Social Work  Discharge planning Services  CM Consult  Post Acute Care Choice:  Resumption of Svcs/PTA Provider Choice offered to:  Patient  DME Arranged:    DME Agency:     HH Arranged:  PT [place]:  [place], active pta  Status of Service:  In process, will continue to follow  If discussed at Long Length of Stay Meetings, dates discussed:    Additional Comments:  Sharin Mons, RN 07/17/2016, 12:05 PM

## 2016-07-17 NOTE — Progress Notes (Addendum)
PROGRESS NOTE    [name]  TDH:[id number] DOB: August 11, 1940 DOA: 07/14/2016 PCP: Wenda Low, MD   Brief Narrative: 76 y.o.femalewith history of diabetes mellitus, hypothyroidism, hypertension and chronic anemia who recently had lumbar decompression surgery was broughtto the ER after found to be increasingly confused. Patient was also brought to the ER 2 days prior complaining of increasing pain and also appearing to be confused. At that time MRI of the T-spine and L-spine done did not show anything acute and patient was discharged home. At home the pt was found to be confused again and also had some myoclonic jerks. After contacting her Neurosurgeon (Dr. Maralyn Sago patient was brought to the ER.  In the ER the patient was found to be agitated and was given Ativan. CT head was unremarkable.  Assessment & Plan:  # Acute encephalopathy/ acute delirium: unknown exact etiology. Unknown if it is related with pain medication/baclofen etc. -Mental status seems improved. CT head with no acute finding. -reportedly patient had myoclonic jerk during this hospitalization. EEG showed no epileptic focus.Continue to monitor.  -PT, OT evaluation. -SW consulted for rehab on discharge -Continue to monitor symptomatic treatment and supportive care.  # Recent lumbar surgery by neurosurgeon: Evaluated by Dr. Ellene Route. Recommended rehabilitation and skilled nursing facility on discharge. -Patient is on Neurontin and Vicodin as needed for pain management.  #Type 2 diabetes: Continue sliding scale. Monitor blood sugar level.  #Hypothyroidism: Continue Synthroid. TSH acceptable.  # Hypertension: BP elevated, takes diovan at home, will start irbesartan and monitor BP.   # Hypokalemia: replete KCl oral. Monitor BMP. Encourage oral intake.   # nausea: Patient has no vomiting. Abdomen exam normal. Patient reported having good bowel movement. Ordered Zofran as needed. Continue supportive care. Encourage  oral intake.  # family discussion: Discussed with patient's sister in detail. She was very angry that why her sister was sent home early in prior admission? Why she was not called to update patient's clinical condition? She said she is currently at cafeteria and wanted to see me in the patient's room in 15 minutes when she is free and done with her lunch. She asked me not to discharge patient home.  I explained and updated the current clinical condition. Today is my first day to take care of this patient. I told her, the patient needs PT/OT and probably benefit from outpatient rehab.   Principal Problem:   Acute encephalopathy Active Problems:   Diabetes mellitus with neuropathy ([place])   Hypothyroidism  DVT prophylaxis:SCD, will add lovenox sq because patient has decreased mobility Code Status:full Family Communication:discussed with patient's sister a Disposition Plan:likely dc to rehab.    Consultants:   none  Procedures:noe Antimicrobials:none  Subjective: Patient was seen and examined at bedside. She was allowed awake. Reported nausea but denied vomiting, chest pain, shortness of breath or abdominal pain. Reported bowel movement.  Objective: Vitals:   07/16/16 1957 07/16/16 2102 07/17/16 0627 07/17/16 0629  BP:  (!) 144/67 (!) 163/80 (!) 152/77  Pulse:  80 84 85  Resp:  16 16   Temp:  98.6 F (37 C) 100.1 F (37.8 C) 98.8 F (37.1 C)  TempSrc:   Oral Oral  SpO2: 93% 94% 95% 96%  Weight:      Height:        Intake/Output Summary (Last 24 hours) at 07/17/16 1306 Last data filed at 07/17/16 0728  Gross per 24 hour  Intake  0 ml  Output                1 ml  Net               -1 ml   Filed Weights   07/15/16 0524  Weight: 80.8 kg (178 lb 3.2 oz)    Examination:  General exam: Appears calm and comfortable  Respiratory system: Clear to auscultation. Respiratory effort normal. No wheezing or crackle Cardiovascular system: S1 & S2 heard, RRR.  No  pedal edema. Gastrointestinal system: Abdomen is nondistended, soft and nontender. Normal bowel sounds heard. Central nervous system: Alert awake and following commands.. Extremities: Symmetric 5 x 5 power. Skin: No rashes, lesions or ulcers      Data Reviewed: I have personally reviewed following labs and imaging studies  CBC:  Recent Labs Lab 07/13/16 0912 07/14/16 2208 07/15/16 0323 07/16/16 0211  WBC 7.2 7.3 6.9 4.8  NEUTROABS 5.4 4.6 5.1  --   HGB 9.8* 9.8* 9.3* 9.8*  HCT 30.7* 30.6* 29.1* 30.8*  MCV 84.8 85.0 84.8 85.3  PLT 332 317 325 315   Basic Metabolic Panel:  Recent Labs Lab 07/13/16 0912 07/14/16 2208 07/15/16 0323 07/16/16 0211  NA 142 141 141 142  K 4.5 3.8 3.1* 3.4*  CL 103 104 105 108  CO2 30 25 26 26   GLUCOSE 197* 117* 112* 104*  BUN 20 13 12 7   CREATININE 1.12* 1.01* 0.89 0.81  CALCIUM 9.5 9.4 9.1 8.9   GFR: Estimated Creatinine Clearance: 63.3 mL/min (by C-G formula based on SCr of 0.81 mg/dL). Liver Function Tests:  Recent Labs Lab 07/13/16 0912 07/14/16 2208 07/15/16 0323 07/16/16 0211  AST 24 28 27 24   ALT 13* 17 16 15   ALKPHOS 78 77 68 77  BILITOT 0.6 0.8 0.8 0.7  PROT 6.5 6.9 6.1* 5.9*  ALBUMIN 3.7 3.8 3.5 3.3*   No results for input(s): LIPASE, AMYLASE in the last 168 hours.  Recent Labs Lab 07/15/16 0142  AMMONIA 27   Coagulation Profile: No results for input(s): INR, PROTIME in the last 168 hours. Cardiac Enzymes: No results for input(s): CKTOTAL, CKMB, CKMBINDEX, TROPONINI in the last 168 hours. BNP (last 3 results) No results for input(s): PROBNP in the last 8760 hours. HbA1C: No results for input(s): HGBA1C in the last 72 hours. CBG:  Recent Labs Lab 07/16/16 1217 07/16/16 1707 07/16/16 1945 07/17/16 0808 07/17/16 1225  GLUCAP 146* 206* 163* 161* 197*   Lipid Profile: No results for input(s): CHOL, HDL, LDLCALC, TRIG, CHOLHDL, LDLDIRECT in the last 72 hours. Thyroid Function Tests:  Recent Labs   07/17/16 1051  TSH 1.437   Anemia Panel: No results for input(s): VITAMINB12, FOLATE, FERRITIN, TIBC, IRON, RETICCTPCT in the last 72 hours. Sepsis Labs: No results for input(s): PROCALCITON, LATICACIDVEN in the last 168 hours.  Recent Results (from the past 240 hour(s))  MRSA PCR Screening     Status: None   Collection Time: 07/15/16  5:27 AM  Result Value Ref Range Status   MRSA by PCR NEGATIVE NEGATIVE Final    Comment:        The GeneXpert MRSA Assay (FDA approved for NASAL specimens only), is one component of a comprehensive MRSA colonization surveillance program. It is not intended to diagnose MRSA infection nor to guide or monitor treatment for MRSA infections.          Radiology Studies: No results found.      Scheduled Meds: . aspirin EC  81 mg Oral Daily  . gabapentin  100 mg Oral TID  . insulin aspart  0-9 Units Subcutaneous Q4H  . levothyroxine  25 mcg Oral QAC breakfast  . mometasone-formoterol  2 puff Inhalation BID  . senna-docusate  1 tablet Oral BID   Continuous Infusions: . sodium chloride 60 mL/hr at 07/16/16 2257     LOS: 2 days    Dron Tanna Furry, MD [facility] Pager [phone]  If 7PM-7AM, please contact night-coverage [email or web address] Password [email or web address] 07/17/2016, 1:06 PM

## 2016-07-17 NOTE — Progress Notes (Signed)
I met patient and her sister at bedside.  Patient was sitting on chair comfortable.  Discussed current clinical condition, medications and plan of care in detail. They wanted to know the cause of myoclonic jerk. Explained to them that it may be due to recent surgery, anesthesia and pain medications. Now she is improved. EEG with no epileptic focus.  Plan for consult neurology to evaluate the patient.  Patient and her sister agreed for PT/OT eval.

## 2016-07-17 NOTE — Progress Notes (Signed)
Patient ID: [name], female   DOB: 1940/11/10, 76 y.o.   MRN: [id number] Appreciate help of neurology with evaluating this difficult patient  neuro does seem improved to day and I did not appreciate any abnormal movements She lives alone and I do not believe it would be best for direct dc home Snf would  Be my recommendation.

## 2016-07-18 DIAGNOSIS — E1140 Type 2 diabetes mellitus with diabetic neuropathy, unspecified: Secondary | ICD-10-CM

## 2016-07-18 DIAGNOSIS — Z794 Long term (current) use of insulin: Secondary | ICD-10-CM

## 2016-07-18 DIAGNOSIS — G253 Myoclonus: Secondary | ICD-10-CM

## 2016-07-18 LAB — BASIC METABOLIC PANEL
ANION GAP: 13 (ref 5–15)
BUN: 10 mg/dL (ref 6–20)
CALCIUM: 9.2 mg/dL (ref 8.9–10.3)
CO2: 23 mmol/L (ref 22–32)
Chloride: 106 mmol/L (ref 101–111)
Creatinine, Ser: 0.95 mg/dL (ref 0.44–1.00)
GFR calc Af Amer: >60 mL/min (ref >60)
GFR, EST NON AFRICAN AMERICAN: 57 mL/min — AB (ref >60)
GLUCOSE: 157 mg/dL — AB (ref 65–99)
Potassium: 3.4 mmol/L — ABNORMAL LOW (ref 3.5–5.1)
SODIUM: 142 mmol/L (ref 135–145)

## 2016-07-18 LAB — GLUCOSE, CAPILLARY
GLUCOSE-CAPILLARY: 138 mg/dL — AB (ref 65–99)
GLUCOSE-CAPILLARY: 191 mg/dL — AB (ref 65–99)
Glucose-Capillary: 182 mg/dL — ABNORMAL HIGH (ref 65–99)
Glucose-Capillary: 152 mg/dL — ABNORMAL HIGH (ref 65–99)
Glucose-Capillary: 175 mg/dL — ABNORMAL HIGH (ref 65–99)

## 2016-07-18 MED ORDER — ENOXAPARIN SODIUM 40 MG/0.4ML ~~LOC~~ SOLN
40.0000 mg | SUBCUTANEOUS | Status: DC
  Administered 2016-07-18: 40 mg via SUBCUTANEOUS
  Filled 2016-07-18: qty 0.4

## 2016-07-18 MED ORDER — IRBESARTAN 300 MG PO TABS
300.0000 mg | ORAL_TABLET | Freq: Every day | ORAL | Status: DC
  Administered 2016-07-18 – 2016-07-19 (×2): 300 mg via ORAL
  Filled 2016-07-18 (×2): qty 1

## 2016-07-18 MED ORDER — POTASSIUM CHLORIDE CRYS ER 20 MEQ PO TBCR
20.0000 meq | EXTENDED_RELEASE_TABLET | Freq: Every day | ORAL | Status: DC
  Administered 2016-07-18 – 2016-07-19 (×2): 20 meq via ORAL
  Filled 2016-07-18 (×2): qty 1

## 2016-07-18 NOTE — Evaluation (Signed)
Occupational Therapy Evaluation Patient Details Name: [name] MRN: [id number] DOB: 02-10-1941 Today's Date: 07/18/2016    History of Present Illness [name] is a 76 y.o. female with history of diabetes mellitus, hypothyroidism, hypertension and chronic anemia who recently had lumbar decompression surgery was brought to the ER after patient was found to be increasingly confused with myoclonic jerking.   Clinical Impression   Pt reports she was managing ADL independently since return home from CIR. Currently pt overall supervision for ADL and functional mobility. Pt able to recall 3/3 back precautions and maintain throughout session. Recommending pt resume [facility] services upon return home to maximize independence and safety with ADL and functional mobility. Pt would benefit from continued skilled OT to address established goals.    Follow Up Recommendations  Home health OT;Supervision - Intermittent (resume [facility] services)    Equipment Recommendations  None recommended by OT    Recommendations for Other Services       Precautions / Restrictions Precautions Precautions: Fall;Back Precaution Booklet Issued: No Precaution Comments: Pt able to recall 3/3 back precautions and maintain throughout session. Restrictions Weight Bearing Restrictions: No      Mobility Bed Mobility               General bed mobility comments: Pt OOB in chair upon arrival  Transfers Overall transfer level: Needs assistance Equipment used: None Transfers: Sit to/from Stand Sit to Stand: Supervision         General transfer comment: No physical assist. Good technique.    Balance Overall balance assessment: Needs assistance Sitting-balance support: Feet supported;No upper extremity supported Sitting balance-Leahy Scale: Good     Standing balance support: No upper extremity supported;During functional activity Standing balance-Leahy Scale: Good                              ADL either performed or assessed with clinical judgement   ADL Overall ADL's : Needs assistance/impaired Eating/Feeding: Independent;Sitting   Grooming: Supervision/safety;Standing   Upper Body Bathing: Set up;Sitting   Lower Body Bathing: Supervison/ safety;Sit to/from stand   Upper Body Dressing : Set up;Sitting   Lower Body Dressing: Supervision/safety;Sit to/from stand   Toilet Transfer: Supervision/safety;Ambulation;RW           Functional mobility during ADLs: Supervision/safety;Rolling walker General ADL Comments: Pt able to recall back precautions and maintain thorughout session.      Vision         Perception     Praxis      Pertinent Vitals/Pain Pain Assessment: Faces Faces Pain Scale: Hurts little more Pain Location: back, R hip Pain Descriptors / Indicators: Sore Pain Intervention(s): Monitored during session;Repositioned     Hand Dominance Right   Extremity/Trunk Assessment Upper Extremity Assessment Upper Extremity Assessment: Overall WFL for tasks assessed   Lower Extremity Assessment Lower Extremity Assessment: Defer to PT evaluation   Cervical / Trunk Assessment Cervical / Trunk Assessment: Other exceptions Cervical / Trunk Exceptions: s/p recent spinal sx   Communication Communication Communication: No difficulties   Cognition Arousal/Alertness: Awake/alert Behavior During Therapy: WFL for tasks assessed/performed Overall Cognitive Status: Within Functional Limits for tasks assessed                                     General Comments       Exercises     Shoulder Instructions  Home Living Family/patient expects to be discharged to:: Unsure Living Arrangements: Alone Available Help at Discharge: Friend(s);Family;Available PRN/intermittently Type of Home: House Home Access: Stairs to enter [employer] of Steps: 2 Entrance Stairs-Rails: Right;Left Home Layout: Two level;Able to live on main  level with bedroom/bathroom Alternate Level Stairs-Number of Steps: flight Alternate Level Stairs-Rails: Right;Left Bathroom Shower/Tub: Walk-in shower;Door   [employer] Toilet: Handicapped height Bathroom Accessibility: Yes How Accessible: Accessible via walker Home Equipment: [place] - 4 wheels;Bedside commode;Shower seat          Prior Functioning/Environment Level of Independence: Independent (since returning from CIR)                 OT Problem List: Decreased strength;Decreased activity tolerance;Impaired balance (sitting and/or standing);Pain      OT Treatment/Interventions: Self-care/ADL training;DME and/or AE instruction;Therapeutic activities;Patient/family education;Balance training    OT Goals(Current goals can be found in the care plan section) Acute Rehab OT Goals Patient Stated Goal: rehab then home OT Goal Formulation: With patient/family Time For Goal Achievement: 08/01/16 Potential to Achieve Goals: Good ADL Goals Pt Will Perform Tub/Shower Transfer: with modified independence;ambulating;Shower transfer;shower seat;rolling walker Additional ADL Goal #1: Pt will perform bed mobility at mod I level with HOB flat and no use of bed rails.  OT Frequency: Min 2X/week   Barriers to D/C: Decreased caregiver support  lives alone       Co-evaluation              End of Session Equipment Utilized During Treatment: Surveyor, mining Communication: Mobility status  Activity Tolerance: Patient tolerated treatment well Patient left: in chair;with call bell/phone within reach;with family/visitor present  OT Visit Diagnosis: Other abnormalities of gait and mobility (R26.89);Muscle weakness (generalized) (M62.81)                Time: 9326-7124 OT Time Calculation (min): 11 min Charges:  OT General Charges $OT Visit: 1 Procedure OT Evaluation $OT Eval Moderate Complexity: 1 Procedure G-Codes:     Tiondra Fang A. Ulice Brilliant, M.S., OTR/L Pager:  [place] 07/18/2016, 12:13 PM

## 2016-07-18 NOTE — Consult Note (Signed)
   [facility] Inpatient Consult   07/18/2016  [name] October 29, 1940 [id number]  Patient was assessed for re-admission as a beneficiary of the [facility] Registry.  Patient is active with [facility] as her primary care provider and will have her transition of care calls performed by [facility].  Chart review reveals the patient is [name] is a 76 y.o. female with history of diabetes mellitus, hypothyroidism, hypertension and chronic anemia who recently had lumbar decompression surgery was brought to the ER after patient was found to be increasingly confused with myoclonic jerking. Met with the patient and her sister Shon Hough, in which the patient said she would like her sister at the bedside in the conversation.  Explained to the patient that [facility] can follow up for community resources and follow up after her primary care provider and can refer to [facility] services.  Patient endorses [facility] for her home health care and feels that's what she will need at discharge at this time.  Patient states, "I have the brochure and the magnet you have from the last time I was her and I kept."  No problems identified by the patient and her sister at this time.  States,"I will look at all of my options."  Updated inpatient RNCM of patient will have transition of care calls from her MD office.  For questions, please contact:  Natividad Brood, RN BSN [facility] Liaison  [phone] business mobile phone Toll free office [phone]

## 2016-07-18 NOTE — Progress Notes (Signed)
Patient ID: [name], female   DOB: January 11, 1941, 76 y.o.   MRN: [id number] Long discussion with the patient and sister regarding planned care Continues to improve Further observation warranted

## 2016-07-18 NOTE — Progress Notes (Signed)
Physical medicine rehabilitation consult requested chart reviewed. Patient well known to rehabilitation services from recent lumbar surgery with radiculopathy 06/27/2016 until 07/03/2016. She was discharged home with assistive device. Most recent physical therapy evaluation upon latest admission 07/17/2016 with recommendations of home health therapies. Patient at minimal assist level without assistive device and will not need inpatient rehabilitation services. Recommendations are discharged to home with home therapies

## 2016-07-18 NOTE — Progress Notes (Signed)
PT Cancellation Note  Patient Details Name: VALOR TURBERVILLE MRN: [id number] DOB: Jan 31, 1941   Cancelled Treatment:    Reason Eval/Treat Not Completed: Patient declined, no reason specified (Pt ambulating in room unassisted and refusing treatment.  Plan to see patient in am to review stair training and HEP for safe d/c home.  )   Cristela Blue 07/18/2016, 2:14 PM Governor Rooks, PTA pager [phone]

## 2016-07-18 NOTE — Progress Notes (Signed)
PROGRESS NOTE    [name]  ZOX:[id number] DOB: 1940/05/20 DOA: 07/14/2016 PCP: Wenda Low, MD   Brief Narrative: 76 y.o.femalewith history of diabetes mellitus, hypothyroidism, hypertension and chronic anemia who recently had lumbar decompression surgery was broughtto the ER after found to be increasingly confused. Patient was also brought to the ER 2 days prior complaining of increasing pain and also appearing to be confused. At that time MRI of the T-spine and L-spine done did not show anything acute and patient was discharged home. At home the pt was found to be confused again and also had some myoclonic jerks. After contacting her Neurosurgeon (Dr. Maralyn Sago patient was brought to the ER.  In the ER the patient was found to be agitated and was given Ativan. CT head was unremarkable.  Assessment & Plan:  # Acute encephalopathy/ acute delirium: likely related with pain medication/baclofen etc. -Mental status improved. CT head with no acute finding. -reportedly patient had myoclonic jerk at home. EEG showed no epileptic focus. Evaluated by neurologist thought to be due to pain medications. Recommended no further investigation. Discussed with the patient to minimize hydrocodone use.  -I had a long discussion with the patient and her sister at bedside. I spent about 40 minutes only during discussion. I updated the current clinical condition and plan of care. PT, OT recommended home with home care services. The patient and her sister insisted on skilled nursing facility and asking for referral to inpatient rehabilitation center. Inpatient rehabilitation consult ordered. I discussed with the case manager regarding family's concern and waiting to go to skilled nursing facility. Social worker will evaluate the patient. I discussed with the patient and her sister about the limitation that patient might not qualify for a skilled level of care at this time. They still like to have further  discussion. Dr. Ellene Route from neurosurgery was present during last half of our conversation at bedside.  # Recent lumbar surgery by neurosurgeon: Evaluated by Dr. Ellene Route. Recommended rehabilitation on discharge. -Patient is on tylenol, Neurontin and Vicodin as needed for pain management. Discussed at bedside to minimize the narcotics use.  #Type 2 diabetes: Continue sliding scale. Monitor blood sugar level.  #Hypothyroidism: Continue Synthroid. TSH acceptable.  # Hypertension: on Irbesartan. Monitor BP.  # Hypokalemia: continue KCl oral. Monitor BMP. Encourage oral intake.   # nausea: improved now. Able to tolerated diet well.  Principal Problem:   Acute encephalopathy Active Problems:   Diabetes mellitus with neuropathy ([facility])   Hypothyroidism   Nausea  DVT prophylaxis:lovenox sq Code Status:full Family Communication:discussed with pt's sister at bedside.  Disposition Plan:unknown, likely home with homecare vs SNF which is less likely. SW evaluation.    Consultants:   none  Procedures:noe Antimicrobials:none  Subjective: Patient was seen and examined at bedside. No new symptoms today. Back pain is improved with the current medications. No jerking movement. Mental status improved. Denied headache, dizziness, nausea, vomiting, chest pain or shortness of breath.  Objective: Vitals:   07/17/16 1625 07/17/16 2019 07/17/16 2053 07/18/16 0515  BP: (!) 151/77  (!) 152/63 (!) 148/87  Pulse: 87  85 78  Resp: 19  18 18   Temp: 99.4 F (37.4 C)  99.3 F (37.4 C) 98.8 F (37.1 C)  TempSrc:      SpO2: 99% 98% 99% 99%  Weight:      Height:        Intake/Output Summary (Last 24 hours) at 07/18/16 1351 Last data filed at 07/18/16 0402  Gross per 24 hour  Intake             2806 ml  Output                1 ml  Net             2805 ml   Filed Weights   07/15/16 0524  Weight: 80.8 kg (178 lb 3.2 oz)    Examination:  General exam: Sitting on chair, not in  distress Respiratory system: Clear bilateral. Respiratory effort normal. No wheezing or crackle Cardiovascular system: Regular rate and rhythm, S1-S2 normal..  No pedal edema. Gastrointestinal system: Abdomen soft, nontender, nondistended. Bowel sound positive Central nervous system: Alert awake and following commands.. Extremities: Symmetric 5 x 5 power. Skin: No rashes, lesions or ulcers      Data Reviewed: I have personally reviewed following labs and imaging studies  CBC:  Recent Labs Lab 07/13/16 0912 07/14/16 2208 07/15/16 0323 07/16/16 0211  WBC 7.2 7.3 6.9 4.8  NEUTROABS 5.4 4.6 5.1  --   HGB 9.8* 9.8* 9.3* 9.8*  HCT 30.7* 30.6* 29.1* 30.8*  MCV 84.8 85.0 84.8 85.3  PLT 332 317 325 035   Basic Metabolic Panel:  Recent Labs Lab 07/13/16 0912 07/14/16 2208 07/15/16 0323 07/16/16 0211 07/18/16 0911  NA 142 141 141 142 142  K 4.5 3.8 3.1* 3.4* 3.4*  CL 103 104 105 108 106  CO2 30 25 26 26 23   GLUCOSE 197* 117* 112* 104* 157*  BUN 20 13 12 7 10   CREATININE 1.12* 1.01* 0.89 0.81 0.95  CALCIUM 9.5 9.4 9.1 8.9 9.2   GFR: Estimated Creatinine Clearance: 54 mL/min (by C-G formula based on SCr of 0.95 mg/dL). Liver Function Tests:  Recent Labs Lab 07/13/16 0912 07/14/16 2208 07/15/16 0323 07/16/16 0211 07/17/16 1634  AST 24 28 27 24 19   ALT 13* 17 16 15 16   ALKPHOS 78 77 68 77 83  BILITOT 0.6 0.8 0.8 0.7 0.4  PROT 6.5 6.9 6.1* 5.9* 6.5  ALBUMIN 3.7 3.8 3.5 3.3* 3.7   No results for input(s): LIPASE, AMYLASE in the last 168 hours.  Recent Labs Lab 07/15/16 0142 07/17/16 1634  AMMONIA 27 31   Coagulation Profile: No results for input(s): INR, PROTIME in the last 168 hours. Cardiac Enzymes: No results for input(s): CKTOTAL, CKMB, CKMBINDEX, TROPONINI in the last 168 hours. BNP (last 3 results) No results for input(s): PROBNP in the last 8760 hours. HbA1C:  Recent Labs  07/17/16 1051  HGBA1C 7.0*   CBG:  Recent Labs Lab 07/17/16 1225  07/17/16 1724 07/17/16 2051 07/18/16 0745 07/18/16 1222  GLUCAP 197* 162* 182* 138* 191*   Lipid Profile: No results for input(s): CHOL, HDL, LDLCALC, TRIG, CHOLHDL, LDLDIRECT in the last 72 hours. Thyroid Function Tests:  Recent Labs  07/17/16 1051  TSH 1.437   Anemia Panel: No results for input(s): VITAMINB12, FOLATE, FERRITIN, TIBC, IRON, RETICCTPCT in the last 72 hours. Sepsis Labs: No results for input(s): PROCALCITON, LATICACIDVEN in the last 168 hours.  Recent Results (from the past 240 hour(s))  MRSA PCR Screening     Status: None   Collection Time: 07/15/16  5:27 AM  Result Value Ref Range Status   MRSA by PCR NEGATIVE NEGATIVE Final    Comment:        The GeneXpert MRSA Assay (FDA approved for NASAL specimens only), is one component of a comprehensive MRSA colonization surveillance program. It is not intended to diagnose MRSA infection nor to guide  or monitor treatment for MRSA infections.          Radiology Studies: No results found.      Scheduled Meds: . aspirin EC  81 mg Oral Daily  . gabapentin  100 mg Oral TID  . insulin aspart  0-9 Units Subcutaneous TID WC  . irbesartan  300 mg Oral Daily  . levothyroxine  25 mcg Oral QAC breakfast  . mometasone-formoterol  2 puff Inhalation BID  . potassium chloride  20 mEq Oral Daily  . senna-docusate  1 tablet Oral BID   Continuous Infusions:    LOS: 3 days    Raywood Wailes Tanna Furry, MD [facility] Pager [phone]  If 7PM-7AM, please contact night-coverage [email or web address] Password [email or web address] 07/18/2016, 1:51 PM

## 2016-07-19 LAB — GLUCOSE, CAPILLARY
GLUCOSE-CAPILLARY: 150 mg/dL — AB (ref 65–99)
GLUCOSE-CAPILLARY: 200 mg/dL — AB (ref 65–99)

## 2016-07-19 MED ORDER — POTASSIUM CHLORIDE CRYS ER 10 MEQ PO TBCR: 20.0000 meq | EXTENDED_RELEASE_TABLET | Freq: Every day | ORAL | 0 refills | Status: DC

## 2016-07-19 MED ORDER — ALUM & MAG HYDROXIDE-SIMETH 200-200-20 MG/5ML PO SUSP
15.0000 mL | Freq: Four times a day (QID) | ORAL | Status: DC | PRN
  Administered 2016-07-19: 15 mL via ORAL
  Filled 2016-07-19: qty 30

## 2016-07-19 MED ORDER — ACETAMINOPHEN 325 MG PO TABS: 650.0000 mg | ORAL_TABLET | Freq: Four times a day (QID) | ORAL | Status: DC | PRN

## 2016-07-19 MED ORDER — INSULIN ASPART 100 UNIT/ML FLEXPEN: 6.0000 [IU] | PEN_INJECTOR | SUBCUTANEOUS | Status: DC

## 2016-07-19 NOTE — Clinical Social Work Note (Signed)
CSW received consult earlier today regarding SNF for patient and advised that patient wants [facility]. CSW talked with patient via phone (case manager went to room with her phone) and was advised that she will go stay with her 2 sisters in [place]. She feels more comfortable with this plan and will have the assistance she needs from her sisters.  Patient discharged today and will stay with her sisters.  [name], MSW, LCSW Licensed Clinical Social Worker [place] [phone]

## 2016-07-19 NOTE — Progress Notes (Signed)
Patient ID: [name], female   DOB: 1941/03/26, 76 y.o.   MRN: [id number] Doing better today. [place] improving. I will sign off at this time

## 2016-07-19 NOTE — NC FL2 (Signed)
[name] LEVEL OF CARE SCREENING TOOL     IDENTIFICATION  Patient Name: [name] Birthdate: 17-Jun-1940 Sex: female Admission Date (Current Location): 07/14/2016  [facility] and [place] Number:  Herbalist and Address:  [facility] [place] [address] [place], [place] [address]      Provider Number: [id number]  Attending Physician Name and Address:  Rosita Fire, MD  Relative Name and Phone Number:  [place], [phone];  [place],Pamela-Sister; [phone]     Current Level of Care: Hospital Recommended Level of Care: [place] Prior Approval Number:    Date Approved/Denied:   PASRR Number: [id number] (Eff. 03/22/11)  Discharge Plan: SNF    Current Diagnoses: Patient Active Problem List   Diagnosis Date Noted  . Myoclonic jerking   . Nausea   . Acute encephalopathy 07/15/2016  . Lumbar radiculopathy 06/27/2016  . Hypothyroidism 06/26/2016  . Labile blood glucose   . Surgery, elective   . Post-operative pain   . Sickle cell trait ([place])   . Acute blood loss anemia   . History of back surgery   . AKI (acute kidney injury) ([place])   . Herniated nucleus pulposus, L2-3 06/25/2016  . Bilateral primary osteoarthritis of knee 03/26/2016  . Osteoarthritis, hand 03/26/2016  . Other insomnia 03/26/2016  . Memory disorder 02/16/2016  . Fibromyalgia 09/27/2014  . Nocturnal leg cramps 09/27/2014  . Neuropathy ([place]) 03/10/2014  . Allergic rhinitis 03/10/2014  . Osteoporosis 03/10/2014  . Spinal stenosis of lumbar region with radiculopathy 02/28/2014  . Type II or unspecified type diabetes mellitus without mention of complication, uncontrolled 07/08/2013  . Hypokalemia 11/02/2012  . Essential hypertension, benign 11/02/2012  . Diabetes mellitus with neuropathy ([place]) 10/29/2012  . Hyperlipidemia 10/29/2012  . Spondylolisthesis of lumbar region 03/19/2011  . Lumbar radicular pain  03/19/2011    Orientation RESPIRATION BLADDER Height & Weight     Self, Time, Situation, Place  Normal Continent Weight: 178 lb 3.2 oz (80.8 kg) Height:  5\' 6"  (167.6 cm)  BEHAVIORAL SYMPTOMS/MOOD NEUROLOGICAL BOWEL NUTRITION STATUS      Continent Diet (Carb modified)  AMBULATORY STATUS COMMUNICATION OF NEEDS Skin   Limited Assist (Min guard per PT) Verbally Other (Comment) (Scar to mid-lower back from back surgery; Incision back)                       Personal Care Assistance Level of Assistance  Bathing, Feeding, Dressing Bathing Assistance: Limited assistance (Supervision per OT) Feeding assistance: Independent Dressing Assistance: Limited assistance (Supervision per OT)     Functional Limitations Info  Sight, Hearing, Speech Sight Info: Impaired (Patient has macular degeneration) Hearing Info: Adequate Speech Info: Adequate    SPECIAL CARE FACTORS FREQUENCY  PT (By licensed PT), OT (By licensed OT)     PT Frequency: Evaluated 3/28 and a minimum of 3X per week therapy recommended OT Frequency: Evaluated 2/29 and a minimum of 2X per week therapy recommended            Contractures Contractures Info: Not present    Additional Factors Info  Code Status, Allergies, Insulin Sliding Scale Code Status Info: Full Allergies Info: Betadine, Cymbalta, Adhesive tape, Lipitor   Insulin Sliding Scale Info: 0-9 Units 3X per day with meals       Current Medications (07/19/2016):  This is the current hospital active medication list Current Facility-Administered Medications  Medication Dose Route Frequency Provider Last Rate Last Dose  . acetaminophen (  TYLENOL) tablet 650 mg  650 mg Oral Q6H PRN Rise Patience, MD   650 mg at 07/19/16 1025   Or  . acetaminophen (TYLENOL) suppository 650 mg  650 mg Rectal Q6H PRN Rise Patience, MD      . alum & mag hydroxide-simeth (MAALOX/MYLANTA) 200-200-20 MG/5ML suspension 15 mL  15 mL Oral Q6H PRN Dron Tanna Furry, MD    15 mL at 07/19/16 0824  . aspirin EC tablet 81 mg  81 mg Oral Daily Cherene Altes, MD   81 mg at 07/19/16 0825  . enoxaparin (LOVENOX) injection 40 mg  40 mg Subcutaneous Q24H Dron Tanna Furry, MD   40 mg at 07/18/16 2106  . gabapentin (NEURONTIN) capsule 100 mg  100 mg Oral TID Cherene Altes, MD   100 mg at 07/19/16 0827  . hydrALAZINE (APRESOLINE) injection 10 mg  10 mg Intravenous Q4H PRN Rise Patience, MD      . HYDROcodone-acetaminophen (NORCO/VICODIN) 5-325 MG per tablet 1 tablet  1 tablet Oral Q6H PRN Ankit Arsenio Loader, MD   1 tablet at 07/18/16 0917  . insulin aspart (novoLOG) injection 0-9 Units  0-9 Units Subcutaneous TID WC Jeryl Columbia, NP   1 Units at 07/19/16 [phone]  . irbesartan (AVAPRO) tablet 300 mg  300 mg Oral Daily Dron Tanna Furry, MD   300 mg at 07/19/16 0827  . levothyroxine (SYNTHROID, LEVOTHROID) tablet 25 mcg  25 mcg Oral QAC breakfast Cherene Altes, MD   25 mcg at 07/19/16 [phone]  . mometasone-formoterol (DULERA) 100-5 MCG/ACT inhaler 2 puff  2 puff Inhalation BID Rise Patience, MD   2 puff at 07/19/16 0815  . ondansetron (ZOFRAN) injection 4 mg  4 mg Intravenous Q6H PRN Dron Tanna Furry, MD   4 mg at 07/18/16 0450  . polyethylene glycol (MIRALAX / GLYCOLAX) packet 17 g  17 g Oral Daily PRN Cherene Altes, MD   17 g at 07/19/16 0827  . potassium chloride SA (K-DUR,KLOR-CON) CR tablet 20 mEq  20 mEq Oral Daily Dron Tanna Furry, MD   20 mEq at 07/19/16 0827  . senna-docusate (Senokot-S) tablet 1 tablet  1 tablet Oral BID Cherene Altes, MD   1 tablet at 07/19/16 0827  . zolpidem (AMBIEN) tablet 5 mg  5 mg Oral QHS PRN Dron Tanna Furry, MD   5 mg at 07/18/16 2306     Discharge Medications: Please see discharge summary for a list of discharge medications.  Relevant Imaging Results:  Relevant Lab Results:   Additional Information [phone]  Sable Feil, LCSW

## 2016-07-19 NOTE — Progress Notes (Signed)
NURSING PROGRESS NOTE  [name] [id number] Discharge Data: 07/19/2016 12:50 PM Attending Provider: Rosita Fire, MD OQH:[id number],TKPTWS, MD     [name] to be D/C'd Home with sister with [place] per MD order.  Discussed with the patient the After Visit Summary and all questions fully answered. All IV's discontinued with no bleeding noted. All belongings returned to patient for patient to take home. Pt given written prescriptions for tylenol and potassium chloride.   Last Vital Signs:  Blood pressure (!) 151/81, pulse 80, temperature 97.4 F (36.3 C), temperature source Oral, resp. rate 18, height 5\' 6"  (1.676 m), weight 80.8 kg (178 lb 3.2 oz), SpO2 97 %.  Discharge Medication List Allergies as of 07/19/2016      Reactions   Betadine [povidone Iodine] Swelling, Other (See Comments)   Reaction to betadine eye drops   Cymbalta [duloxetine Hcl] Other (See Comments)   Dizziness    Adhesive [tape] Rash   Lipitor [atorvastatin] Rash      Medication List    STOP taking these medications   baclofen 10 MG tablet Commonly known as:  LIORESAL   LYRICA 50 MG capsule Generic drug:  pregabalin   oxyCODONE-acetaminophen 5-325 MG tablet Commonly known as:  PERCOCET   saccharomyces boulardii 250 MG capsule Commonly known as:  FLORASTOR     TAKE these medications   acetaminophen 325 MG tablet Commonly known as:  TYLENOL Take 2 tablets (650 mg total) by mouth every 6 (six) hours as needed for mild pain (or Fever >/= 101).   aspirin 81 MG EC tablet Take 81mg  by mouth once daily   CAL-MAG-ZINC PO Take 1 tablet by mouth daily.   calcium carbonate 750 MG chewable tablet Commonly known as:  TUMS EX Chew 1 tablet by mouth 2 (two) times daily as needed for heartburn (indigestion).   cetirizine 10 MG tablet Commonly known as:  ZYRTEC Take 10 mg by mouth daily.   diclofenac sodium 1 % Gel Commonly known as:  VOLTAREN Apply 4 g topically 4 (four) times daily.  Voltaren Gel 3 grams to 3 large joints upto TID 3 TUBES with 3 refills What changed:  when to take this  reasons to take this  additional instructions   ezetimibe 10 MG tablet Commonly known as:  ZETIA TAKE 1 TABLET(10 MG) BY MOUTH DAILY   gabapentin 300 MG capsule Commonly known as:  NEURONTIN TAKE 1 CAPSULE(300 MG) BY MOUTH THREE TIMES DAILY   glimepiride 2 MG tablet Commonly known as:  AMARYL Take 1 tablet (2 mg total) by mouth daily with breakfast.   HYDROcodone-acetaminophen 5-325 MG tablet Commonly known as:  NORCO/VICODIN Take 1-2 tablets by mouth every 4 (four) hours as needed (breakthrough pain).   insulin aspart 100 UNIT/ML FlexPen Commonly known as:  NOVOLOG Inject 6-10 Units into the skin See admin instructions. Inject 6 units SQ before breakfast, 10 units SQ before lunch and 9 units SQ before supper. What changed:  how much to take  when to take this  additional instructions  Another medication with the same name was removed. Continue taking this medication, and follow the directions you see here.   insulin detemir 100 UNIT/ML injection Commonly known as:  LEVEMIR Inject 0.12 mLs (12 Units total) into the skin at bedtime. What changed:  how much to take   Insulin Pen Needle 32G X 4 MM Misc Commonly known as:  BD PEN NEEDLE NANO U/F USE AS DIRECTED   levothyroxine 25 MCG  tablet Commonly known as:  SYNTHROID, LEVOTHROID TAKE 1 TABLET(25 MCG) BY MOUTH DAILY BEFORE BREAKFAST   polyethylene glycol packet Commonly known as:  MIRALAX / GLYCOLAX Take 17 g by mouth daily as needed for mild constipation.   potassium chloride 10 MEQ tablet Commonly known as:  K-DUR,KLOR-CON Take 2 tablets (20 mEq total) by mouth daily. What changed:  how much to take   PROAIR RESPICLICK 909 (90 Base) MCG/ACT Aepb Generic drug:  Albuterol Sulfate Inhale 2 puffs into the lungs 3 (three) times daily as needed (shortness of breath).   senna-docusate 8.6-50 MG  tablet Commonly known as:  Senokot-S Take 1 tablet by mouth 2 (two) times daily.   SYMBICORT 80-4.5 MCG/ACT inhaler Generic drug:  budesonide-formoterol Inhale 2 puffs into the lungs 2 (two) times daily as needed (shortness of breath).   valsartan-hydrochlorothiazide 320-12.5 MG tablet Commonly known as:  DIOVAN-HCT Take 1 tablet by mouth daily.   vitamin B-12 500 MCG tablet Commonly known as:  CYANOCOBALAMIN Take 500 mcg by mouth daily.   vitamin E 400 UNIT capsule Take 400 Units by mouth daily.

## 2016-07-19 NOTE — Care Management Note (Signed)
Case Management Note  Patient Details  Name: [name] MRN: [id number] Date of Birth: 02-26-1941  Subjective/Objective:         Admitted with acute encephalopathy.            PCP: Wenda Low  Action/Plan:  Plan is to d/c to home with sister, [name] today who lives in [place], [place]. Nikol[place]  [place], [place] [phone]  [place] [facility] [place]  Expected Discharge Date:   07/19/2016             Expected Discharge Plan:  [place]  In-House Referral:  Clinical Social Work/ declined SNF placement   Discharge planning Services  CM Consult  Post Acute Care Choice:  Resumption of Svcs/PTA Provider Choice offered to:  Patient  DME Arranged:    DME Agency:     [place] Arranged:  RN,PT,OT,SW [place]:  [place], CM made referral with [facility]abe[facility] Cleda Mccreedy @ [phone], fax : [phone]   Status of Service:  completed  If discussed at [place] of Stay Meetings, dates discussed:    Additional Comments:  Sharin Mons, RN 07/19/2016, 11:37 AM

## 2016-07-19 NOTE — Discharge Summary (Signed)
Physician Discharge Summary  [name] URK:[id number] DOB: July 07, 1940 DOA: 07/14/2016  PCP: Wenda Low, MD  Admit date: 07/14/2016 Discharge date: 07/19/2016  Admitted From:Home Disposition:home with home care services.  Recommendations for Outpatient Follow-up:  1. Follow up with PCP and Dr. Ellene Route  in 1-2 weeks 2. Please obtain BMP/CBC in one week  Home Health: yes Equipment/Devices:no Discharge Condition:stable CODE STATUS:full Diet recommendation: carb modified heart healthy diet  Brief/Interim Summary:76 y.o.femalewith history of diabetes mellitus, hypothyroidism, hypertension and chronic anemia who recently had lumbar decompression surgery was broughtto the ER after found to be increasingly confused. Patient was also brought to the ER 2 days prior complaining of increasing pain and also appearing to be confused. At that time MRI of the T-spine and L-spine done did not show anything acute and patient was discharged home. At home the pt was found to be confused again and also had some myoclonic jerks. After contacting her Neurosurgeon (Dr. Maralyn Sago patient was brought to the ER.  # Acute encephalopathy/ acute delirium: likely related with pain medication/baclofen etc. -Mental status improved. CT head with no acute finding. -reportedly patient had myoclonic jerk at home. EEG showed no epileptic focus. Evaluated by neurologist thought to be due to pain medications. Recommended no further investigation. Discussed with the patient to minimize hydrocodone use.  -evaluated by Dr. Ellene Route.  -Also evaluated by PT/OT Recommended discharging home with home care services. Patient is able to ambulate and can function well. Inpatient rehabilitation was consulted where patient was not accepted. I discussed with the patient at bedside at length today. She wanted to go home with her sister today. I discussed with the care management team regarding arrangement of home care services  including PT, OT, RN, home health aide, social worker. I recommended patient to follow-up with her PCP and neurosurgeon. Patient's mental status improved.  # Recent lumbar surgery by neurosurgeon: Evaluated by Dr. Ellene Route. Recommended rehabilitation on discharge. -Patient is on tylenol, Neurontin and Vicodin as needed for pain management. Discussed at bedside to minimize the narcotics use.  #Type 2 diabetes: Resume home dose of insulin. Recommended to monitor blood sugar level and follow-up with PCP.   #Hypothyroidism: Continue Synthroid. TSH acceptable.  # Hypertension: Resume home medications as below. Monitor blood sugar level.  # Hypokalemia: On oral potassium chloride. I recommended patient to monitor labs with PCP in a week.  # nausea: improved now. Able to tolerated diet well.  Patient decided to go home with her sister. Home care arranged. Patient has no myoclonic jerk or any new symptoms. She is able to ambulate and do well. At this time, patient is medically stable to transfer her care to outpatient. Recommended outpatient physical therapy.   Discharge Diagnoses:  Principal Problem:   Acute encephalopathy Active Problems:   Diabetes mellitus with neuropathy (HCC)   Hypothyroidism   Nausea   Myoclonic jerking    Discharge Instructions  Discharge Instructions    Call MD for:  difficulty breathing, headache or visual disturbances    Complete by:  As directed    Call MD for:  extreme fatigue    Complete by:  As directed    Call MD for:  hives    Complete by:  As directed    Call MD for:  persistant dizziness or light-headedness    Complete by:  As directed    Call MD for:  persistant nausea and vomiting    Complete by:  As directed    Call MD for:  redness,  tenderness, or signs of infection (pain, swelling, redness, odor or green/yellow discharge around incision site)    Complete by:  As directed    Call MD for:  severe uncontrolled pain    Complete by:  As  directed    Call MD for:  temperature >100.4    Complete by:  As directed    Diet - low sodium heart healthy    Complete by:  As directed    Diet Carb Modified    Complete by:  As directed    Increase activity slowly    Complete by:  As directed      Allergies as of 07/19/2016      Reactions   Betadine [povidone Iodine] Swelling, Other (See Comments)   Reaction to betadine eye drops   Cymbalta [duloxetine Hcl] Other (See Comments)   Dizziness    Adhesive [tape] Rash   Lipitor [atorvastatin] Rash      Medication List    STOP taking these medications   baclofen 10 MG tablet Commonly known as:  LIORESAL   LYRICA 50 MG capsule Generic drug:  pregabalin   oxyCODONE-acetaminophen 5-325 MG tablet Commonly known as:  PERCOCET   saccharomyces boulardii 250 MG capsule Commonly known as:  FLORASTOR     TAKE these medications   acetaminophen 325 MG tablet Commonly known as:  TYLENOL Take 2 tablets (650 mg total) by mouth every 6 (six) hours as needed for mild pain (or Fever >/= 101).   aspirin 81 MG EC tablet Take 81mg  by mouth once daily   CAL-MAG-ZINC PO Take 1 tablet by mouth daily.   calcium carbonate 750 MG chewable tablet Commonly known as:  TUMS EX Chew 1 tablet by mouth 2 (two) times daily as needed for heartburn (indigestion).   cetirizine 10 MG tablet Commonly known as:  ZYRTEC Take 10 mg by mouth daily.   diclofenac sodium 1 % Gel Commonly known as:  VOLTAREN Apply 4 g topically 4 (four) times daily. Voltaren Gel 3 grams to 3 large joints upto TID 3 TUBES with 3 refills What changed:  when to take this  reasons to take this  additional instructions   ezetimibe 10 MG tablet Commonly known as:  ZETIA TAKE 1 TABLET(10 MG) BY MOUTH DAILY   gabapentin 300 MG capsule Commonly known as:  NEURONTIN TAKE 1 CAPSULE(300 MG) BY MOUTH THREE TIMES DAILY   glimepiride 2 MG tablet Commonly known as:  AMARYL Take 1 tablet (2 mg total) by mouth daily with  breakfast.   HYDROcodone-acetaminophen 5-325 MG tablet Commonly known as:  NORCO/VICODIN Take 1-2 tablets by mouth every 4 (four) hours as needed (breakthrough pain).   insulin aspart 100 UNIT/ML FlexPen Commonly known as:  NOVOLOG Inject 6-10 Units into the skin See admin instructions. Inject 6 units SQ before breakfast, 10 units SQ before lunch and 9 units SQ before supper. What changed:  how much to take  when to take this  additional instructions  Another medication with the same name was removed. Continue taking this medication, and follow the directions you see here.   insulin detemir 100 UNIT/ML injection Commonly known as:  LEVEMIR Inject 0.12 mLs (12 Units total) into the skin at bedtime. What changed:  how much to take   Insulin Pen Needle 32G X 4 MM Misc Commonly known as:  BD PEN NEEDLE NANO U/F USE AS DIRECTED   levothyroxine 25 MCG tablet Commonly known as:  SYNTHROID, LEVOTHROID TAKE 1 TABLET(25 MCG) BY MOUTH  DAILY BEFORE BREAKFAST   polyethylene glycol packet Commonly known as:  MIRALAX / GLYCOLAX Take 17 g by mouth daily as needed for mild constipation.   potassium chloride 10 MEQ tablet Commonly known as:  K-DUR,KLOR-CON Take 2 tablets (20 mEq total) by mouth daily. What changed:  how much to take   PROAIR RESPICLICK 308 (90 Base) MCG/ACT Aepb Generic drug:  Albuterol Sulfate Inhale 2 puffs into the lungs 3 (three) times daily as needed (shortness of breath).   senna-docusate 8.6-50 MG tablet Commonly known as:  Senokot-S Take 1 tablet by mouth 2 (two) times daily.   SYMBICORT 80-4.5 MCG/ACT inhaler Generic drug:  budesonide-formoterol Inhale 2 puffs into the lungs 2 (two) times daily as needed (shortness of breath).   valsartan-hydrochlorothiazide 320-12.5 MG tablet Commonly known as:  DIOVAN-HCT Take 1 tablet by mouth daily.   vitamin B-12 500 MCG tablet Commonly known as:  CYANOCOBALAMIN Take 500 mcg by mouth daily.   vitamin E 400  UNIT capsule Take 400 Units by mouth daily.      Follow-up Information    HUSAIN,KARRAR, MD. Schedule an appointment as soon as possible for a visit in 1 week(s).   Specialty:  Internal Medicine Contact information: [address] [place] [address] [phone]        Earleen Newport, MD. Schedule an appointment as soon as possible for a visit in 2 week(s).   Specialty:  Neurosurgery Contact information: [address] [place] [address] [phone]          Allergies  Allergen Reactions  . Betadine [Povidone Iodine] Swelling and Other (See Comments)    Reaction to betadine eye drops  . Cymbalta [Duloxetine Hcl] Other (See Comments)    Dizziness   . Adhesive [Tape] Rash  . Lipitor [Atorvastatin] Rash    Consultations: Neurosurgeon  Procedures/Studies: None  Subjective: Patient was seen and examined at bedside. She was sitting on bed looks comfortable. She wanted to go home with her sister and SERVICES. Denies headache, dizziness, nausea, vomiting, chest pain or shortness of breath.  Discharge Exam: Vitals:   07/18/16 2203 07/19/16 0602  BP: (!) 143/72 (!) 151/81  Pulse: 81 80  Resp: 18 18  Temp: 98.9 F (37.2 C) 97.4 F (36.3 C)   Vitals:   07/18/16 2108 07/18/16 2203 07/19/16 0602 07/19/16 0815  BP:  (!) 143/72 (!) 151/81   Pulse:  81 80   Resp:  18 18   Temp:  98.9 F (37.2 C) 97.4 F (36.3 C)   TempSrc:  Oral Oral   SpO2: 95% 91% 97% 97%  Weight:      Height:        General: Pt is alert, awake, not in acute distress Cardiovascular: RRR, S1/S2 +, no rubs, no gallops Respiratory: CTA bilaterally, no wheezing, no rhonchi Abdominal: Soft, NT, ND, bowel sounds + Extremities: no edema, no cyanosis Neurology, alert, awake, following commands, no myoclonic jerks.   The results of significant diagnostics from this hospitalization (including imaging, microbiology, ancillary and laboratory) are listed below  for reference.     Microbiology: Recent Results (from the past 240 hour(s))  MRSA PCR Screening     Status: None   Collection Time: 07/15/16  5:27 AM  Result Value Ref Range Status   MRSA by PCR NEGATIVE NEGATIVE Final    Comment:        The GeneXpert MRSA Assay (FDA approved for NASAL specimens only), is one component of  a comprehensive MRSA colonization surveillance program. It is not intended to diagnose MRSA infection nor to guide or monitor treatment for MRSA infections.      Labs: BNP (last 3 results) No results for input(s): BNP in the last 8760 hours. Basic Metabolic Panel:  Recent Labs Lab 07/13/16 0912 07/14/16 2208 07/15/16 0323 07/16/16 0211 07/18/16 0911  NA 142 141 141 142 142  K 4.5 3.8 3.1* 3.4* 3.4*  CL 103 104 105 108 106  CO2 30 25 26 26 23   GLUCOSE 197* 117* 112* 104* 157*  BUN 20 13 12 7 10   CREATININE 1.12* 1.01* 0.89 0.81 0.95  CALCIUM 9.5 9.4 9.1 8.9 9.2   Liver Function Tests:  Recent Labs Lab 07/13/16 0912 07/14/16 2208 07/15/16 0323 07/16/16 0211 07/17/16 1634  AST 24 28 27 24 19   ALT 13* 17 16 15 16   ALKPHOS 78 77 68 77 83  BILITOT 0.6 0.8 0.8 0.7 0.4  PROT 6.5 6.9 6.1* 5.9* 6.5  ALBUMIN 3.7 3.8 3.5 3.3* 3.7   No results for input(s): LIPASE, AMYLASE in the last 168 hours.  Recent Labs Lab 07/15/16 0142 07/17/16 1634  AMMONIA 27 31   CBC:  Recent Labs Lab 07/13/16 0912 07/14/16 2208 07/15/16 0323 07/16/16 0211  WBC 7.2 7.3 6.9 4.8  NEUTROABS 5.4 4.6 5.1  --   HGB 9.8* 9.8* 9.3* 9.8*  HCT 30.7* 30.6* 29.1* 30.8*  MCV 84.8 85.0 84.8 85.3  PLT 332 317 325 280   Cardiac Enzymes: No results for input(s): CKTOTAL, CKMB, CKMBINDEX, TROPONINI in the last 168 hours. BNP: Invalid input(s): POCBNP CBG:  Recent Labs Lab 07/18/16 0745 07/18/16 1222 07/18/16 1644 07/18/16 2202 07/19/16 0737  GLUCAP 138* 191* 152* 175* 150*   D-Dimer No results for input(s): DDIMER in the last 72 hours. Hgb A1c  Recent  Labs  07/17/16 1051  HGBA1C 7.0*   Lipid Profile No results for input(s): CHOL, HDL, LDLCALC, TRIG, CHOLHDL, LDLDIRECT in the last 72 hours. Thyroid function studies  Recent Labs  07/17/16 1051  TSH 1.437   Anemia work up No results for input(s): VITAMINB12, FOLATE, FERRITIN, TIBC, IRON, RETICCTPCT in the last 72 hours. Urinalysis    Component Value Date/Time   COLORURINE STRAW (A) 07/15/2016 0023   APPEARANCEUR CLEAR 07/15/2016 0023   LABSPEC 1.009 07/15/2016 0023   PHURINE 5.0 07/15/2016 0023   GLUCOSEU NEGATIVE 07/15/2016 0023   GLUCOSEU 250 (A) 05/30/2014 1623   HGBUR NEGATIVE 07/15/2016 0023   BILIRUBINUR NEGATIVE 07/15/2016 0023   KETONESUR NEGATIVE 07/15/2016 0023   PROTEINUR NEGATIVE 07/15/2016 0023   UROBILINOGEN 0.2 05/30/2014 1623   NITRITE NEGATIVE 07/15/2016 0023   LEUKOCYTESUR NEGATIVE 07/15/2016 0023   Sepsis Labs Invalid input(s): PROCALCITONIN,  WBC,  LACTICIDVEN Microbiology Recent Results (from the past 240 hour(s))  MRSA PCR Screening     Status: None   Collection Time: 07/15/16  5:27 AM  Result Value Ref Range Status   MRSA by PCR NEGATIVE NEGATIVE Final    Comment:        The GeneXpert MRSA Assay (FDA approved for NASAL specimens only), is one component of a comprehensive MRSA colonization surveillance program. It is not intended to diagnose MRSA infection nor to guide or monitor treatment for MRSA infections.      Time coordinating discharge: 33 minutes  SIGNED:   Rosita Fire, MD  [facility] 07/19/2016, 11:55 AM  If 7PM-7AM, please contact night-coverage [email or web address] Password [email or web address]

## 2016-07-19 NOTE — Care Management Important Message (Signed)
Important Message  Patient Details  Name: [name] MRN: [id number] Date of Birth: 07-12-1940   Medicare Important Message Given:  Yes    Nathen May 07/19/2016, 10:55 AM

## 2016-07-21 DIAGNOSIS — Z87891 Personal history of nicotine dependence: Secondary | ICD-10-CM | POA: Diagnosis not present

## 2016-07-21 DIAGNOSIS — G253 Myoclonus: Secondary | ICD-10-CM | POA: Diagnosis not present

## 2016-07-21 DIAGNOSIS — M1991 Primary osteoarthritis, unspecified site: Secondary | ICD-10-CM | POA: Diagnosis not present

## 2016-07-21 DIAGNOSIS — Z9181 History of falling: Secondary | ICD-10-CM | POA: Diagnosis not present

## 2016-07-21 DIAGNOSIS — Z794 Long term (current) use of insulin: Secondary | ICD-10-CM | POA: Diagnosis not present

## 2016-07-21 DIAGNOSIS — Z7982 Long term (current) use of aspirin: Secondary | ICD-10-CM | POA: Diagnosis not present

## 2016-07-21 DIAGNOSIS — Z602 Problems related to living alone: Secondary | ICD-10-CM | POA: Diagnosis not present

## 2016-07-21 DIAGNOSIS — F419 Anxiety disorder, unspecified: Secondary | ICD-10-CM | POA: Diagnosis not present

## 2016-07-21 DIAGNOSIS — G8918 Other acute postprocedural pain: Secondary | ICD-10-CM | POA: Diagnosis not present

## 2016-07-21 DIAGNOSIS — M797 Fibromyalgia: Secondary | ICD-10-CM | POA: Diagnosis not present

## 2016-07-21 DIAGNOSIS — Z981 Arthrodesis status: Secondary | ICD-10-CM | POA: Diagnosis not present

## 2016-07-21 DIAGNOSIS — Z4789 Encounter for other orthopedic aftercare: Secondary | ICD-10-CM | POA: Diagnosis not present

## 2016-07-21 DIAGNOSIS — E876 Hypokalemia: Secondary | ICD-10-CM | POA: Diagnosis not present

## 2016-07-21 DIAGNOSIS — E1140 Type 2 diabetes mellitus with diabetic neuropathy, unspecified: Secondary | ICD-10-CM | POA: Diagnosis not present

## 2016-07-21 DIAGNOSIS — J45909 Unspecified asthma, uncomplicated: Secondary | ICD-10-CM | POA: Diagnosis not present

## 2016-07-21 DIAGNOSIS — I10 Essential (primary) hypertension: Secondary | ICD-10-CM | POA: Diagnosis not present

## 2016-07-21 DIAGNOSIS — D573 Sickle-cell trait: Secondary | ICD-10-CM | POA: Diagnosis not present

## 2016-07-21 DIAGNOSIS — D649 Anemia, unspecified: Secondary | ICD-10-CM | POA: Diagnosis not present

## 2016-07-23 DIAGNOSIS — G8918 Other acute postprocedural pain: Secondary | ICD-10-CM | POA: Diagnosis not present

## 2016-07-23 DIAGNOSIS — J45909 Unspecified asthma, uncomplicated: Secondary | ICD-10-CM | POA: Diagnosis not present

## 2016-07-23 DIAGNOSIS — E1140 Type 2 diabetes mellitus with diabetic neuropathy, unspecified: Secondary | ICD-10-CM | POA: Diagnosis not present

## 2016-07-23 DIAGNOSIS — I10 Essential (primary) hypertension: Secondary | ICD-10-CM | POA: Diagnosis not present

## 2016-07-23 DIAGNOSIS — Z4789 Encounter for other orthopedic aftercare: Secondary | ICD-10-CM | POA: Diagnosis not present

## 2016-07-23 DIAGNOSIS — M797 Fibromyalgia: Secondary | ICD-10-CM | POA: Diagnosis not present

## 2016-07-24 DIAGNOSIS — I129 Hypertensive chronic kidney disease with stage 1 through stage 4 chronic kidney disease, or unspecified chronic kidney disease: Secondary | ICD-10-CM | POA: Diagnosis not present

## 2016-07-24 DIAGNOSIS — M1990 Unspecified osteoarthritis, unspecified site: Secondary | ICD-10-CM | POA: Diagnosis not present

## 2016-07-24 DIAGNOSIS — M545 Low back pain: Secondary | ICD-10-CM | POA: Diagnosis not present

## 2016-07-24 DIAGNOSIS — E785 Hyperlipidemia, unspecified: Secondary | ICD-10-CM | POA: Diagnosis not present

## 2016-07-24 DIAGNOSIS — M797 Fibromyalgia: Secondary | ICD-10-CM | POA: Diagnosis not present

## 2016-07-24 DIAGNOSIS — J45909 Unspecified asthma, uncomplicated: Secondary | ICD-10-CM | POA: Diagnosis not present

## 2016-07-24 DIAGNOSIS — E1122 Type 2 diabetes mellitus with diabetic chronic kidney disease: Secondary | ICD-10-CM | POA: Diagnosis not present

## 2016-07-24 DIAGNOSIS — E1165 Type 2 diabetes mellitus with hyperglycemia: Secondary | ICD-10-CM | POA: Diagnosis not present

## 2016-07-24 DIAGNOSIS — N183 Chronic kidney disease, stage 3 (moderate): Secondary | ICD-10-CM | POA: Diagnosis not present

## 2016-07-24 DIAGNOSIS — D649 Anemia, unspecified: Secondary | ICD-10-CM | POA: Diagnosis not present

## 2016-07-24 DIAGNOSIS — E876 Hypokalemia: Secondary | ICD-10-CM | POA: Diagnosis not present

## 2016-07-24 DIAGNOSIS — E1140 Type 2 diabetes mellitus with diabetic neuropathy, unspecified: Secondary | ICD-10-CM | POA: Diagnosis not present

## 2016-07-24 DIAGNOSIS — I10 Essential (primary) hypertension: Secondary | ICD-10-CM | POA: Diagnosis not present

## 2016-07-24 DIAGNOSIS — G8918 Other acute postprocedural pain: Secondary | ICD-10-CM | POA: Diagnosis not present

## 2016-07-24 DIAGNOSIS — F419 Anxiety disorder, unspecified: Secondary | ICD-10-CM | POA: Diagnosis not present

## 2016-07-24 DIAGNOSIS — E1142 Type 2 diabetes mellitus with diabetic polyneuropathy: Secondary | ICD-10-CM | POA: Diagnosis not present

## 2016-07-24 DIAGNOSIS — Z4789 Encounter for other orthopedic aftercare: Secondary | ICD-10-CM | POA: Diagnosis not present

## 2016-07-25 DIAGNOSIS — E1140 Type 2 diabetes mellitus with diabetic neuropathy, unspecified: Secondary | ICD-10-CM | POA: Diagnosis not present

## 2016-07-25 DIAGNOSIS — I10 Essential (primary) hypertension: Secondary | ICD-10-CM | POA: Diagnosis not present

## 2016-07-25 DIAGNOSIS — M797 Fibromyalgia: Secondary | ICD-10-CM | POA: Diagnosis not present

## 2016-07-25 DIAGNOSIS — J45909 Unspecified asthma, uncomplicated: Secondary | ICD-10-CM | POA: Diagnosis not present

## 2016-07-25 DIAGNOSIS — Z4789 Encounter for other orthopedic aftercare: Secondary | ICD-10-CM | POA: Diagnosis not present

## 2016-07-25 DIAGNOSIS — G8918 Other acute postprocedural pain: Secondary | ICD-10-CM | POA: Diagnosis not present

## 2016-07-30 DIAGNOSIS — M797 Fibromyalgia: Secondary | ICD-10-CM | POA: Diagnosis not present

## 2016-07-30 DIAGNOSIS — G8918 Other acute postprocedural pain: Secondary | ICD-10-CM | POA: Diagnosis not present

## 2016-07-30 DIAGNOSIS — Z4789 Encounter for other orthopedic aftercare: Secondary | ICD-10-CM | POA: Diagnosis not present

## 2016-07-30 DIAGNOSIS — J45909 Unspecified asthma, uncomplicated: Secondary | ICD-10-CM | POA: Diagnosis not present

## 2016-07-30 DIAGNOSIS — E1140 Type 2 diabetes mellitus with diabetic neuropathy, unspecified: Secondary | ICD-10-CM | POA: Diagnosis not present

## 2016-07-30 DIAGNOSIS — I10 Essential (primary) hypertension: Secondary | ICD-10-CM | POA: Diagnosis not present

## 2016-08-01 ENCOUNTER — Ambulatory Visit: Payer: Medicare Other | Admitting: Endocrinology

## 2016-08-01 DIAGNOSIS — E876 Hypokalemia: Secondary | ICD-10-CM | POA: Diagnosis not present

## 2016-08-01 DIAGNOSIS — M797 Fibromyalgia: Secondary | ICD-10-CM | POA: Diagnosis not present

## 2016-08-01 DIAGNOSIS — I10 Essential (primary) hypertension: Secondary | ICD-10-CM | POA: Diagnosis not present

## 2016-08-01 DIAGNOSIS — Z4789 Encounter for other orthopedic aftercare: Secondary | ICD-10-CM | POA: Diagnosis not present

## 2016-08-01 DIAGNOSIS — J45909 Unspecified asthma, uncomplicated: Secondary | ICD-10-CM | POA: Diagnosis not present

## 2016-08-01 DIAGNOSIS — G8918 Other acute postprocedural pain: Secondary | ICD-10-CM | POA: Diagnosis not present

## 2016-08-01 DIAGNOSIS — D649 Anemia, unspecified: Secondary | ICD-10-CM | POA: Diagnosis not present

## 2016-08-01 DIAGNOSIS — E1140 Type 2 diabetes mellitus with diabetic neuropathy, unspecified: Secondary | ICD-10-CM | POA: Diagnosis not present

## 2016-08-04 ENCOUNTER — Other Ambulatory Visit: Payer: Self-pay | Admitting: Endocrinology

## 2016-08-05 DIAGNOSIS — I10 Essential (primary) hypertension: Secondary | ICD-10-CM | POA: Diagnosis not present

## 2016-08-05 DIAGNOSIS — Z4789 Encounter for other orthopedic aftercare: Secondary | ICD-10-CM | POA: Diagnosis not present

## 2016-08-05 DIAGNOSIS — G8918 Other acute postprocedural pain: Secondary | ICD-10-CM | POA: Diagnosis not present

## 2016-08-05 DIAGNOSIS — M797 Fibromyalgia: Secondary | ICD-10-CM | POA: Diagnosis not present

## 2016-08-05 DIAGNOSIS — J45909 Unspecified asthma, uncomplicated: Secondary | ICD-10-CM | POA: Diagnosis not present

## 2016-08-05 DIAGNOSIS — E1140 Type 2 diabetes mellitus with diabetic neuropathy, unspecified: Secondary | ICD-10-CM | POA: Diagnosis not present

## 2016-08-06 DIAGNOSIS — Z4789 Encounter for other orthopedic aftercare: Secondary | ICD-10-CM | POA: Diagnosis not present

## 2016-08-06 DIAGNOSIS — I10 Essential (primary) hypertension: Secondary | ICD-10-CM | POA: Diagnosis not present

## 2016-08-06 DIAGNOSIS — E1140 Type 2 diabetes mellitus with diabetic neuropathy, unspecified: Secondary | ICD-10-CM | POA: Diagnosis not present

## 2016-08-06 DIAGNOSIS — J45909 Unspecified asthma, uncomplicated: Secondary | ICD-10-CM | POA: Diagnosis not present

## 2016-08-06 DIAGNOSIS — G8918 Other acute postprocedural pain: Secondary | ICD-10-CM | POA: Diagnosis not present

## 2016-08-06 DIAGNOSIS — M797 Fibromyalgia: Secondary | ICD-10-CM | POA: Diagnosis not present

## 2016-08-08 ENCOUNTER — Other Ambulatory Visit: Payer: Self-pay | Admitting: Endocrinology

## 2016-08-09 ENCOUNTER — Other Ambulatory Visit (INDEPENDENT_AMBULATORY_CARE_PROVIDER_SITE_OTHER): Payer: Medicare Other

## 2016-08-09 DIAGNOSIS — E1165 Type 2 diabetes mellitus with hyperglycemia: Secondary | ICD-10-CM

## 2016-08-09 DIAGNOSIS — Z794 Long term (current) use of insulin: Secondary | ICD-10-CM

## 2016-08-09 DIAGNOSIS — E063 Autoimmune thyroiditis: Secondary | ICD-10-CM

## 2016-08-09 DIAGNOSIS — E782 Mixed hyperlipidemia: Secondary | ICD-10-CM | POA: Diagnosis not present

## 2016-08-09 LAB — COMPREHENSIVE METABOLIC PANEL
ALBUMIN: 4.5 g/dL (ref 3.5–5.2)
ALT: 11 U/L (ref 0–35)
AST: 17 U/L (ref 0–37)
Alkaline Phosphatase: 76 U/L (ref 39–117)
BILIRUBIN TOTAL: 0.5 mg/dL (ref 0.2–1.2)
BUN: 16 mg/dL (ref 6–23)
CO2: 28 meq/L (ref 19–32)
CREATININE: 1 mg/dL (ref 0.40–1.20)
Calcium: 9.9 mg/dL (ref 8.4–10.5)
Chloride: 100 mEq/L (ref 96–112)
GFR: 69.21 mL/min (ref >60.00)
Glucose, Bld: 258 mg/dL — ABNORMAL HIGH (ref 70–99)
Potassium: 3.4 mEq/L — ABNORMAL LOW (ref 3.5–5.1)
SODIUM: 138 meq/L (ref 135–145)
Total Protein: 7.3 g/dL (ref 6.0–8.3)

## 2016-08-09 LAB — LDL CHOLESTEROL, DIRECT: LDL DIRECT: 180 mg/dL

## 2016-08-09 LAB — TSH: TSH: 2.14 u[IU]/mL (ref 0.35–4.50)

## 2016-08-09 LAB — HEMOGLOBIN A1C: Hgb A1c MFr Bld: 6.6 % — ABNORMAL HIGH (ref 4.6–6.5)

## 2016-08-12 DIAGNOSIS — G9340 Encephalopathy, unspecified: Secondary | ICD-10-CM | POA: Diagnosis not present

## 2016-08-12 DIAGNOSIS — I10 Essential (primary) hypertension: Secondary | ICD-10-CM | POA: Diagnosis not present

## 2016-08-12 DIAGNOSIS — Z981 Arthrodesis status: Secondary | ICD-10-CM | POA: Diagnosis not present

## 2016-08-12 DIAGNOSIS — E1142 Type 2 diabetes mellitus with diabetic polyneuropathy: Secondary | ICD-10-CM | POA: Diagnosis not present

## 2016-08-14 ENCOUNTER — Ambulatory Visit (INDEPENDENT_AMBULATORY_CARE_PROVIDER_SITE_OTHER): Payer: Medicare Other | Admitting: Endocrinology

## 2016-08-14 ENCOUNTER — Encounter: Payer: Self-pay | Admitting: Endocrinology

## 2016-08-14 VITALS — BP 146/80 | HR 81 | Ht 65.0 in | Wt 178.0 lb

## 2016-08-14 DIAGNOSIS — E782 Mixed hyperlipidemia: Secondary | ICD-10-CM

## 2016-08-14 DIAGNOSIS — E876 Hypokalemia: Secondary | ICD-10-CM

## 2016-08-14 DIAGNOSIS — Z794 Long term (current) use of insulin: Secondary | ICD-10-CM | POA: Diagnosis not present

## 2016-08-14 DIAGNOSIS — I10 Essential (primary) hypertension: Secondary | ICD-10-CM

## 2016-08-14 DIAGNOSIS — E1165 Type 2 diabetes mellitus with hyperglycemia: Secondary | ICD-10-CM

## 2016-08-14 NOTE — Progress Notes (Signed)
Patient ID: [name], female   DOB: 04/06/1941, 76 y.o.   MRN: [id number]    Reason for Appointment:    follow-up  History of Present Illness    PROBLEM 1: Type 2 diabetes mellitus, date of diagnosis: 1998.   Prior history: She had previously been treated with Byetta, Glumetza, Amaryl, Victoza and  Onglyza However because of inadequate control and intolerance to drugs she was finally given pre-meal insulin along with Amaryl, Glumetza eventually stopped because of side effects and also Lantus added  Recent history:  The insulin regimen is: Novolog 6-8 at breakfast and lunch,  10 units acs   LEVEMIR insulin 14-16 units daily hs Oral agents: None  Her A1c has been mostly between about 6.8 -7.4,  now better at 6.6  Current blood sugar patterns and problems with management:  She has checked her blood sugars at least once a day on an average now and more than before  She has had hospitalizations and with her living with her sister and changing her diet her blood sugars have been quite erratic   However they are less fluctuating compared to her last visit when she had high steroid induced hyperglycemia  Since fasting readings had been higher she has occasionally taken 16 units of Levemir including last night when she had dessert; fasting glucose today was 100.  However she is not checking readings after meals very consistently especially in the evenings  Most of her readings recently after supper are high  Blood sugars are variably high after breakfast including in the lab, she thinks this is more when she is eating oatmeal  Highest blood sugar was 349 around 6 PM  No hypoglycemia recently although did have a couple of relatively low readings about 4 weeks ago after 8 PM  Had previously tried Victoza which caused nausea  Monitors blood glucose: 1-2 times a day, readings as below Glucometer: One Touch.   Mean values apply above for all meters except median for  One Touch  PRE-MEAL Fasting Lunch Dinner Bedtime Overall  Glucose range: 97-223 87-294  65-3 49  59-285   Mean/median: 166  204  222  192  185   POST-MEAL PC Breakfast PC Lunch PC Dinner  Glucose range:   194-285  Mean/median:      Meals: 2-3 meals per day at 10 am. 2 pm and 6-8 pm but inconsistent schedule.  Breakfast may be [place] sausage and egg with toast, occasionally oatmeal or cereal   Physical activity: exercise:  Water aerobics currently none  Certified Diabetes Educator visit: Most recent: 7/13.  Dietician visit: Most recent: 3/13.   Wt Readings from Last 3 Encounters:  08/14/16 178 lb (80.7 kg)  07/15/16 178 lb 3.2 oz (80.8 kg)  07/13/16 180 lb (45.8 kg)   Complications: Neuropathy  LABS:  Lab Results  Component Value Date   HGBA1C 6.6 (H) 08/09/2016   HGBA1C 7.0 (H) 07/17/2016   HGBA1C 7.8 (H) 06/26/2016   Lab Results  Component Value Date   MICROALBUR 1.9 02/22/2016   LDLCALC 155 (H) 05/24/2016   CREATININE 1.00 08/09/2016    Multiple other issues are addressed in review of systems  Lab on 08/09/2016  Component Date Value Ref Range Status  . Direct LDL 08/09/2016 180.0  mg/dL Final  . Hgb A1c MFr Bld 08/09/2016 6.6* 4.6 - 6.5 % Final  . Sodium 08/09/2016 138  135 - 145 mEq/L Final  . Potassium 08/09/2016 3.4* 3.5 - 5.1 mEq/L  Final  . Chloride 08/09/2016 100  96 - 112 mEq/L Final  . CO2 08/09/2016 28  19 - 32 mEq/L Final  . Glucose, Bld 08/09/2016 258* 70 - 99 mg/dL Final  . BUN 08/09/2016 16  6 - 23 mg/dL Final  . Creatinine, Ser 08/09/2016 1.00  0.40 - 1.20 mg/dL Final  . Total Bilirubin 08/09/2016 0.5  0.2 - 1.2 mg/dL Final  . Alkaline Phosphatase 08/09/2016 76  39 - 117 U/L Final  . AST 08/09/2016 17  0 - 37 U/L Final  . ALT 08/09/2016 11  0 - 35 U/L Final  . Total Protein 08/09/2016 7.3  6.0 - 8.3 g/dL Final  . Albumin 08/09/2016 4.5  3.5 - 5.2 g/dL Final  . Calcium 08/09/2016 9.9  8.4 - 10.5 mg/dL Final  . GFR 08/09/2016 69.21  >60.00  mL/min Final  . TSH 08/09/2016 2.14  0.35 - 4.50 uIU/mL Final      Allergies as of 08/14/2016      Reactions   Betadine [povidone Iodine] Swelling, Other (See Comments)   Reaction to betadine eye drops   Cymbalta [duloxetine Hcl] Other (See Comments)   Dizziness    Adhesive [tape] Rash   Lipitor [atorvastatin] Rash      Medication List       Accurate as of 08/14/16 12:06 PM. Always use your most recent med list.          acetaminophen 325 MG tablet Commonly known as:  TYLENOL Take 2 tablets (650 mg total) by mouth every 6 (six) hours as needed for mild pain (or Fever >/= 101).   aspirin 81 MG EC tablet Take 46m by mouth once daily   CAL-MAG-ZINC PO Take 1 tablet by mouth daily.   calcium carbonate 750 MG chewable tablet Commonly known as:  TUMS EX Chew 1 tablet by mouth 2 (two) times daily as needed for heartburn (indigestion).   cetirizine 10 MG tablet Commonly known as:  ZYRTEC Take 10 mg by mouth daily.   diclofenac sodium 1 % Gel Commonly known as:  VOLTAREN Apply 4 g topically 4 (four) times daily. Voltaren Gel 3 grams to 3 large joints upto TID 3 TUBES with 3 refills   ezetimibe 10 MG tablet Commonly known as:  ZETIA TAKE 1 TABLET(10 MG) BY MOUTH DAILY   gabapentin 300 MG capsule Commonly known as:  NEURONTIN TAKE 1 CAPSULE(300 MG) BY MOUTH THREE TIMES DAILY   gabapentin 300 MG capsule Commonly known as:  NEURONTIN TAKE 1 CAPSULE(300 MG) BY MOUTH THREE TIMES DAILY   glimepiride 2 MG tablet Commonly known as:  AMARYL TAKE 2 TABLETS BY MOUTH EVERY MORNING AND THEN TAKE 2 TABLETS EVERY EVENING   HYDROcodone-acetaminophen 5-325 MG tablet Commonly known as:  NORCO/VICODIN Take 1-2 tablets by mouth every 4 (four) hours as needed (breakthrough pain).   ibuprofen 800 MG tablet Commonly known as:  ADVIL,MOTRIN   insulin aspart 100 UNIT/ML FlexPen Commonly known as:  NOVOLOG Inject 6-10 Units into the skin See admin instructions. Inject 6 units SQ  before breakfast, 10 units SQ before lunch and 9 units SQ before supper.   NOVOLOG FLEXPEN 100 UNIT/ML FlexPen Generic drug:  insulin aspart INJECT 6 UNITS UNDER THE SKIN BEFORE BREAKFAST, 10 UNITS BEFORE LUNCH, AND 9 UNITS BEFORE SUPPER   insulin detemir 100 UNIT/ML injection Commonly known as:  LEVEMIR Inject 0.12 mLs (12 Units total) into the skin at bedtime.   Insulin Pen Needle 32G X 4 MM Misc Commonly known  as:  BD PEN NEEDLE NANO U/F USE AS DIRECTED   levothyroxine 25 MCG tablet Commonly known as:  SYNTHROID, LEVOTHROID TAKE 1 TABLET(25 MCG) BY MOUTH DAILY BEFORE BREAKFAST   polyethylene glycol packet Commonly known as:  MIRALAX / GLYCOLAX Take 17 g by mouth daily as needed for mild constipation.   potassium chloride 10 MEQ tablet Commonly known as:  K-DUR,KLOR-CON Take 2 tablets (20 mEq total) by mouth daily.   PROAIR RESPICLICK 993 (90 Base) MCG/ACT Aepb Generic drug:  Albuterol Sulfate Inhale 2 puffs into the lungs 3 (three) times daily as needed (shortness of breath).   senna-docusate 8.6-50 MG tablet Commonly known as:  Senokot-S Take 1 tablet by mouth 2 (two) times daily.   SYMBICORT 80-4.5 MCG/ACT inhaler Generic drug:  budesonide-formoterol Inhale 2 puffs into the lungs 2 (two) times daily as needed (shortness of breath).   valsartan-hydrochlorothiazide 320-12.5 MG tablet Commonly known as:  DIOVAN-HCT Take 1 tablet by mouth daily.   vitamin B-12 500 MCG tablet Commonly known as:  CYANOCOBALAMIN Take 500 mcg by mouth daily.   vitamin E 400 UNIT capsule Take 400 Units by mouth daily.       Allergies:  Allergies  Allergen Reactions  . Betadine [Povidone Iodine] Swelling and Other (See Comments)    Reaction to betadine eye drops  . Cymbalta [Duloxetine Hcl] Other (See Comments)    Dizziness   . Adhesive [Tape] Rash  . Lipitor [Atorvastatin] Rash    Past Medical History:  Diagnosis Date  . Anemia    has sickle cell trait  . Anxiety   .  Arthritis   . Asthma    has used inhaler in past for asthmatic bronchitis, last time- early 2012  . Complication of anesthesia    wakes up shaking  . Diabetes mellitus   . Dyslipidemia   . Fibromyalgia   . GERD (gastroesophageal reflux disease)    occas. use of  Prilosec  . Heart murmur    sees Dr. Montez Morita, last seen- early 2012  . Hypertension    02/2010- stress test /w PCP  . Hypothyroidism   . Neuromuscular disorder (HCC)    lumbar radiculopathy, lumbago  . Nocturnal leg cramps 09/27/2014  . Pneumonia   . Sickle cell trait ([place])   . Sleep apnea    borderline sleep apnea, states she no longer uses, early 2012- stopped using     Past Surgical History:  Procedure Laterality Date  . ABDOMINAL HYSTERECTOMY    . adb.cyst     ovarian cyst  . BACK SURGERY     2012, 2015 (3 total)  . COLONOSCOPY    . EYE SURGERY     macular degeneration treatment - injections  . OVARIAN CYST SURGERY      Family History  Problem Relation Age of Onset  . Ovarian cancer Mother   . Cancer - Prostate Father   . Breast cancer Paternal Aunt   . Multiple myeloma Paternal Aunt   . Anesthesia problems Neg Hx   . Hypotension Neg Hx   . Malignant hyperthermia Neg Hx   . Pseudochol deficiency Neg Hx     Social History:  reports that she quit smoking about 17 years ago. She has never used smokeless tobacco. She reports that she drinks alcohol. She reports that she does not use drugs.  ROS    NEUROPATHY: She has had tingling infeet and legs and also has pain going down her legs especially at night. She is taking  mostly gabapentin with relief She thinks she takes Lyrica only as needed for fibromyalgia  Diabetic foot exam done in 9/16 Diabetic foot exam shows normal monofilament sensation in the toes and plantar surfaces, no skin lesions or ulcers on the feet and normal pedal pulses  Hyperlipidemia:   The lipid abnormality consists of elevated LDL , high triglyceride and did not tolerate Crestor  or lovastatin She thinks they make her cramps worse  LDL is increased as expected  Zetia  however she is not recently taking this      Lab Results  Component Value Date   CHOL 227 (H) 05/24/2016   HDL 44.70 05/24/2016   LDLCALC 155 (H) 05/24/2016   LDLDIRECT 180.0 08/09/2016   TRIG 137.0 05/24/2016   CHOLHDL 5 05/24/2016     MUSCLE cramps: she is   HYPERTENSION:  Has been present for several years.   His controlled with taking Diovan HCT Does monitor at home   Lab Results  Component Value Date   CREATININE 1.00 08/09/2016   BUN 16 08/09/2016   NA 138 08/09/2016   K 3.4 (L) 08/09/2016   CL 100 08/09/2016   CO2 28 08/09/2016     Nonspecific fatigue: Does have a relatively high TSH as of 4/17 and was empirically given Synthroid 25 g, Half tablet daily  TSH is improved   Lab Results  Component Value Date   TSH 2.14 08/09/2016   TSH 1.437 07/17/2016   TSH 3.62 02/22/2016   FREET4 0.78 02/22/2016         Examination:   BP (!) 146/80   Pulse 81   Ht '5\' 5"'  (1.651 m)   Wt 178 lb (80.7 kg)   SpO2 96%   BMI 29.62 kg/m   Body mass index is 29.62 kg/m.     Assesment/Plan:   1. DIABETES type 2 with BMI of 28 See history of present illness for detailed discussion of management, blood sugar patterns and problems identified  Her blood sugars are again erratic and surprisingly her A1c is down to 6.6 even though her blood sugars are averaging nearly 200 at home recently Although fasting readings have been mostly high they are improving the last few days She says she has been off her diet with not being at home and now his able to do better Asking about [employer] program and encouraged her to do this  She is generally getting higher readings in the afternoons and may not be getting 24-hour effect of Levemir from her bedtime dose She does need to adjust her mealtime doses based on what she is eating and may take more insulin at suppertime and if needed but  will also need to check more sugars after supper also   Insulin changes:  Levemir 16 units daily and try to do this in the morning  Adjust NovoLog better rest on meal size and carbohydrate intake  Make sure she has protein at breakfast daily and consider increasing morning dose if sugars are consistently high at lunchtime  Consider consultation with dietitian  3. Hypertension: Blood pressure is fairly well controlled on Diovan HCT, needs to follow-up with PCP   4.   Hyperlipidemia:  Needs to resume her Zetia, discussed that her LDL has gone up  5.  Hypokalemia: Potassium is still relatively low at 3.4 and she needs to take her potassium twice a day as prescribed instead of once a day  6.  Muscle cramps: Etiology unknown, she needs to discuss  with PCP    Patient Instructions  Novolog 6-8 at breakfast and lunch,  10 units at supper     LEVEMIR insulin 16 units AT BREAKFAST, may go to 14 if am sugar gets below 90  Take potassium 2x daily          Counseling time on subjects discussed above is over 50% of today's 25 minute visit   Aldea Avis 08/14/2016, 12:06 PM       Lab on 08/09/2016  Component Date Value Ref Range Status  . Direct LDL 08/09/2016 180.0  mg/dL Final  . Hgb A1c MFr Bld 08/09/2016 6.6* 4.6 - 6.5 % Final  . Sodium 08/09/2016 138  135 - 145 mEq/L Final  . Potassium 08/09/2016 3.4* 3.5 - 5.1 mEq/L Final  . Chloride 08/09/2016 100  96 - 112 mEq/L Final  . CO2 08/09/2016 28  19 - 32 mEq/L Final  . Glucose, Bld 08/09/2016 258* 70 - 99 mg/dL Final  . BUN 08/09/2016 16  6 - 23 mg/dL Final  . Creatinine, Ser 08/09/2016 1.00  0.40 - 1.20 mg/dL Final  . Total Bilirubin 08/09/2016 0.5  0.2 - 1.2 mg/dL Final  . Alkaline Phosphatase 08/09/2016 76  39 - 117 U/L Final  . AST 08/09/2016 17  0 - 37 U/L Final  . ALT 08/09/2016 11  0 - 35 U/L Final  . Total Protein 08/09/2016 7.3  6.0 - 8.3 g/dL Final  . Albumin 08/09/2016 4.5  3.5 - 5.2 g/dL Final  . Calcium  08/09/2016 9.9  8.4 - 10.5 mg/dL Final  . GFR 08/09/2016 69.21  >60.00 mL/min Final  . TSH 08/09/2016 2.14  0.35 - 4.50 uIU/mL Final

## 2016-08-14 NOTE — Patient Instructions (Addendum)
Novolog 6-8 at breakfast and lunch,  10 units at supper     LEVEMIR insulin 16 units AT BREAKFAST, may go to 14 if am sugar gets below 90  Take potassium 2x daily

## 2016-08-15 ENCOUNTER — Other Ambulatory Visit: Payer: Self-pay | Admitting: Endocrinology

## 2016-08-16 ENCOUNTER — Telehealth: Payer: Self-pay | Admitting: Endocrinology

## 2016-08-16 ENCOUNTER — Other Ambulatory Visit: Payer: Self-pay

## 2016-08-16 MED ORDER — EZETIMIBE 10 MG PO TABS: ORAL_TABLET | ORAL | 5 refills | Status: DC

## 2016-08-16 NOTE — Telephone Encounter (Signed)
Pt called in and wanted to know if she can start taking the Levemir in the morning instead of at bedtime.  She said you may let her know the answer through [place].

## 2016-08-17 ENCOUNTER — Encounter: Payer: Self-pay | Admitting: Endocrinology

## 2016-08-21 DIAGNOSIS — Z6832 Body mass index (BMI) 32.0-32.9, adult: Secondary | ICD-10-CM | POA: Diagnosis not present

## 2016-08-21 DIAGNOSIS — M48062 Spinal stenosis, lumbar region with neurogenic claudication: Secondary | ICD-10-CM | POA: Diagnosis not present

## 2016-08-21 DIAGNOSIS — I10 Essential (primary) hypertension: Secondary | ICD-10-CM | POA: Diagnosis not present

## 2016-08-22 ENCOUNTER — Encounter: Payer: Self-pay | Admitting: Endocrinology

## 2016-08-22 ENCOUNTER — Telehealth: Payer: Self-pay | Admitting: *Deleted

## 2016-08-22 DIAGNOSIS — Z961 Presence of intraocular lens: Secondary | ICD-10-CM | POA: Diagnosis not present

## 2016-08-22 DIAGNOSIS — E119 Type 2 diabetes mellitus without complications: Secondary | ICD-10-CM | POA: Diagnosis not present

## 2016-08-22 DIAGNOSIS — Z794 Long term (current) use of insulin: Secondary | ICD-10-CM | POA: Diagnosis not present

## 2016-08-22 DIAGNOSIS — H31011 Macula scars of posterior pole (postinflammatory) (post-traumatic), right eye: Secondary | ICD-10-CM | POA: Diagnosis not present

## 2016-08-22 NOTE — Telephone Encounter (Signed)
Patient is returning a call from her mychart message. Please advise [phone]

## 2016-08-22 NOTE — Telephone Encounter (Signed)
I had sent her a message on my chart which she has not seen which reads: Start taking Levemir 8 units twice a day, this will work better When Levemir finished we will switch her to either [place] or TOUJEO once a day and she will need to check insurance coverage for this

## 2016-08-22 NOTE — Telephone Encounter (Signed)
Called patient and left a message to call us back. (Need to go over notes from Dr. Dwyane Dee)

## 2016-08-22 NOTE — Telephone Encounter (Signed)
Called patient and she stated to me that she already checked with her insurance for the coverage of the [place] or [place]. She stated that she would like to stay on her Levemir for now since she is doing better with the twice daily dose. She also stated that she contacted her back doctor and was advised to have her liver and kidneys checked due to her back pain. She has responded on her My Chart also and may have questions for Dr. Dwyane Dee.

## 2016-08-22 NOTE — Telephone Encounter (Signed)
Patient sent another email and is agreeable to switching to [place] U-100, will start with 14 units in the morning daily, please send prescription for 5 pens, no refill Also let her know her liver and kidneys were checked about 2 weeks ago and were fine, she has to talk to her PCP to evaluate gallbladder

## 2016-08-23 NOTE — Telephone Encounter (Signed)
Called patient and she said she saw Dr. Ronnie Derby note in her My Chart and she will try what he is suggesting.She has enough to last her until her next visit with Dr. Dwyane Dee before she makes a change. She stated that she would like to continue using the Levemir.

## 2016-08-26 ENCOUNTER — Other Ambulatory Visit: Payer: Self-pay | Admitting: Internal Medicine

## 2016-08-26 DIAGNOSIS — R109 Unspecified abdominal pain: Secondary | ICD-10-CM

## 2016-08-26 DIAGNOSIS — R140 Abdominal distension (gaseous): Secondary | ICD-10-CM | POA: Diagnosis not present

## 2016-08-26 DIAGNOSIS — M545 Low back pain: Secondary | ICD-10-CM | POA: Diagnosis not present

## 2016-08-26 DIAGNOSIS — R1030 Lower abdominal pain, unspecified: Secondary | ICD-10-CM | POA: Diagnosis not present

## 2016-08-27 ENCOUNTER — Ambulatory Visit
Admission: RE | Admit: 2016-08-27 | Discharge: 2016-08-27 | Disposition: A | Discharge status: Home / Self Care | Payer: Medicare Other | Source: Ambulatory Visit | Attending: Internal Medicine | Admitting: Internal Medicine

## 2016-08-27 DIAGNOSIS — R109 Unspecified abdominal pain: Secondary | ICD-10-CM

## 2016-08-27 DIAGNOSIS — R140 Abdominal distension (gaseous): Secondary | ICD-10-CM | POA: Diagnosis not present

## 2016-09-03 DIAGNOSIS — Z1231 Encounter for screening mammogram for malignant neoplasm of breast: Secondary | ICD-10-CM | POA: Diagnosis not present

## 2016-09-08 ENCOUNTER — Other Ambulatory Visit: Payer: Self-pay | Admitting: Endocrinology

## 2016-09-09 ENCOUNTER — Ambulatory Visit: Payer: Medicare Other | Admitting: Rheumatology

## 2016-09-09 NOTE — Telephone Encounter (Signed)
She does not need to take glimepiride with her insulin regimen now.  Also asked her if she is getting Lyrica, cannot take Lyrica and gabapentin together

## 2016-09-11 ENCOUNTER — Other Ambulatory Visit: Payer: Self-pay | Admitting: Endocrinology

## 2016-09-11 ENCOUNTER — Other Ambulatory Visit: Payer: Self-pay

## 2016-09-11 MED ORDER — GABAPENTIN 300 MG PO CAPS: ORAL_CAPSULE | ORAL | 1 refills | Status: DC

## 2016-09-11 MED ORDER — GLIMEPIRIDE 2 MG PO TABS: ORAL_TABLET | ORAL | 3 refills | Status: DC

## 2016-09-17 DIAGNOSIS — H353122 Nonexudative age-related macular degeneration, left eye, intermediate dry stage: Secondary | ICD-10-CM | POA: Diagnosis not present

## 2016-09-17 DIAGNOSIS — H353114 Nonexudative age-related macular degeneration, right eye, advanced atrophic with subfoveal involvement: Secondary | ICD-10-CM | POA: Diagnosis not present

## 2016-09-17 DIAGNOSIS — H43391 Other vitreous opacities, right eye: Secondary | ICD-10-CM | POA: Diagnosis not present

## 2016-09-17 DIAGNOSIS — H353212 Exudative age-related macular degeneration, right eye, with inactive choroidal neovascularization: Secondary | ICD-10-CM | POA: Diagnosis not present

## 2016-10-01 ENCOUNTER — Encounter: Payer: Self-pay | Admitting: Allergy and Immunology

## 2016-10-01 ENCOUNTER — Ambulatory Visit (INDEPENDENT_AMBULATORY_CARE_PROVIDER_SITE_OTHER): Payer: Medicare Other | Admitting: Allergy and Immunology

## 2016-10-01 VITALS — BP 130/80 | HR 84 | Resp 16 | Ht 65.0 in | Wt 178.0 lb

## 2016-10-01 DIAGNOSIS — J4530 Mild persistent asthma, uncomplicated: Secondary | ICD-10-CM | POA: Diagnosis not present

## 2016-10-01 DIAGNOSIS — J3089 Other allergic rhinitis: Secondary | ICD-10-CM

## 2016-10-01 DIAGNOSIS — H1045 Other chronic allergic conjunctivitis: Secondary | ICD-10-CM | POA: Diagnosis not present

## 2016-10-01 DIAGNOSIS — H1010 Acute atopic conjunctivitis, unspecified eye: Secondary | ICD-10-CM

## 2016-10-01 MED ORDER — FLUTICASONE FUROATE 100 MCG/ACT IN AEPB: 1.0000 | INHALATION_SPRAY | Freq: Every day | RESPIRATORY_TRACT | 3 refills | Status: DC

## 2016-10-01 NOTE — Patient Instructions (Addendum)
  1. Treat and prevent inflammation:   A. Arnuity 100 one inhalation 1 time per day  B. OTC Nasacort 1 spray each nostril one time per day  2. If needed:   A. cetirizine/Zyrtec 10 mg tablet one time per day  B. Proventil HFA or similar 2 puffs every 4-6 hours  3. "Action plan" for asthma flare up:   A. increase Arnuity to 2 inhalations twice a day  B. use Proventil HFA if needed  4. Obtain fall flu vaccine  5. Return to clinic in 6 months or earlier if problem

## 2016-10-01 NOTE — Progress Notes (Signed)
Dear Dr. Lysle Rubens,  Thank you for referring [name] to the [facility] on 10/01/2016.   Below is a summation of this patient's evaluation and recommendations.  Thank you for your referral. I will keep you informed about this patient's response to treatment.   If you have any questions please do not hesitate to contact me.   Sincerely,   Jiles Prows, MD Allergy / Immunology [place]   ______________________________________________________________________    NEW PATIENT NOTE  Referring Provider: Wenda Low, MD Primary Provider: Wenda Low, MD Date of office visit: 10/01/2016    Subjective:   Chief Complaint:  [name] (DOB: 12/16/1940) is a 76 y.o. female who presents to the clinic on 10/01/2016 with a chief complaint of Follow-up .     HPI: [name] presents to this clinic in evaluation of long-standing asthma and allergic rhinoconjunctivitis that developed during early childhood.  Apparently she develops recurrent episodes of wheezing and coughing and states that she usually requires prednisone about twice a year with exacerbations that occur usually during the fall but some symptomatology throughout the entire year. She has apparently been evaluated by an allergist many years ago and found to be allergic to various aeroallergens including pollens and animals and dust. She does believe that triggers include exposure to grass and exposure to ragweed and exposure to dust and exposure to cat. Her requirement for bronchodilator is about every 2 days or so. She does not really exercise to any degree secondary to a lower back issue. Because of her back issue she has been receiving injections of systemic steroids and for this reason she thinks that her spring has actually been relatively good regarding her atopic disease.  She also has a history of nasal congestion and  sneezing and itchy red watery eyes occurring on a perennial basis with exacerbation during the spring and fall that was triggered off by exposure to pollens and cats and dust. She has been given some Flonase in the past which she does not use very often and she uses Zyrtec which helps her somewhat. Many years ago she did have immunotherapy administered but this has not been so for at least the past 2 decades.  There may have been an issue with bee sting reaction but she is not really sure if she is confusing her situation with her daughter's issue. Without doubt her daughter has an allergy to hymenoptera and she does carry an EpiPen. [name] cannot really remember any details about her the reaction and can never remember being given an EpiPen.  She tells me that she does have a history of osteoporosis although it does not appear as though she takes any type of therapy other than calcium and vitamin supplementation.  Past Medical History:  Diagnosis Date  . Anemia    has sickle cell trait  . Anxiety   . Arthritis   . Asthma    has used inhaler in past for asthmatic bronchitis, last time- early 2012  . Complication of anesthesia    wakes up shaking  . Diabetes mellitus   . Dyslipidemia   . Fibromyalgia   . GERD (gastroesophageal reflux disease)    occas. use of  Prilosec  . Heart murmur    sees Dr. Montez Morita, last seen- early 2012  . Hypertension    02/2010- stress test /w PCP  . Hypothyroidism   . Neuromuscular disorder (  HCC)    lumbar radiculopathy, lumbago  . Nocturnal leg cramps 09/27/2014  . Pneumonia   . Sickle cell trait ([facility])   . Sleep apnea    borderline sleep apnea, states she no longer uses, early 2012- stopped using     Past Surgical History:  Procedure Laterality Date  . ABDOMINAL HYSTERECTOMY    . adb.cyst     ovarian cyst  . BACK SURGERY     2012, 2015 (3 total)  . COLONOSCOPY    . EYE SURGERY     macular degeneration treatment - injections  . OVARIAN CYST SURGERY       Allergies as of 10/01/2016      Reactions   Betadine [povidone Iodine] Swelling, Other (See Comments)   Reaction to betadine eye drops   Cymbalta [duloxetine Hcl] Other (See Comments)   Dizziness    Adhesive [tape] Rash   Lipitor [atorvastatin] Rash      Medication List      acetaminophen 325 MG tablet Commonly known as:  TYLENOL Take 2 tablets (650 mg total) by mouth every 6 (six) hours as needed for mild pain (or Fever >/= 101).   aspirin 81 MG EC tablet Take 25m by mouth once daily   CAL-MAG-ZINC PO Take 1 tablet by mouth daily.   calcium carbonate 750 MG chewable tablet Commonly known as:  TUMS EX Chew 1 tablet by mouth 2 (two) times daily as needed for heartburn (indigestion).   cetirizine 10 MG tablet Commonly known as:  ZYRTEC Take 10 mg by mouth daily.   ezetimibe 10 MG tablet Commonly known as:  ZETIA TAKE 1 TABLET(10 MG) BY MOUTH DAILY   fluticasone 50 MCG/ACT nasal spray Commonly known as:  FLONASE Place 1 spray into both nostrils daily.   gabapentin 300 MG capsule Commonly known as:  NEURONTIN TAKE 1 CAPSULE(300 MG) BY MOUTH THREE TIMES DAILY   glimepiride 2 MG tablet Commonly known as:  AMARYL TAKE 2 TABLETS BY MOUTH EVERY MORNING AND 2 TABLETS EVERY EVENING   HYDROcodone-acetaminophen 5-325 MG tablet Commonly known as:  NORCO/VICODIN Take 1-2 tablets by mouth every 4 (four) hours as needed (breakthrough pain).   ibuprofen 800 MG tablet Commonly known as:  ADVIL,MOTRIN   insulin aspart 100 UNIT/ML FlexPen Commonly known as:  NOVOLOG Inject 6-10 Units into the skin See admin instructions. Inject 6 units SQ before breakfast, 10 units SQ before lunch and 9 units SQ before supper.   NOVOLOG FLEXPEN 100 UNIT/ML FlexPen Generic drug:  insulin aspart INJECT 6 UNITS UNDER THE SKIN BEFORE BREAKFAST, 10 UNITS BEFORE LUNCH, AND 9 UNITS BEFORE SUPPER   insulin detemir 100 UNIT/ML injection Commonly known as:  LEVEMIR Inject 0.12 mLs (12 Units  total) into the skin at bedtime.   Insulin Pen Needle 32G X 4 MM Misc Commonly known as:  BD PEN NEEDLE NANO U/F USE AS DIRECTED   levothyroxine 25 MCG tablet Commonly known as:  SYNTHROID, LEVOTHROID TAKE 1 TABLET(25 MCG) BY MOUTH DAILY BEFORE BREAKFAST   meloxicam 7.5 MG tablet Commonly known as:  MOBIC Take 7.5 mg by mouth daily.   polyethylene glycol packet Commonly known as:  MIRALAX / GLYCOLAX Take 17 g by mouth daily as needed for mild constipation.   potassium chloride 10 MEQ tablet Commonly known as:  K-DUR,KLOR-CON Take 2 tablets (20 mEq total) by mouth daily.   PROAIR RESPICLICK 1500(90 Base) MCG/ACT Aepb Generic drug:  Albuterol Sulfate Inhale 2 puffs into the lungs 3 (three)  times daily as needed (shortness of breath).   senna-docusate 8.6-50 MG tablet Commonly known as:  Senokot-S Take 1 tablet by mouth 2 (two) times daily.   SYMBICORT 80-4.5 MCG/ACT inhaler Generic drug:  budesonide-formoterol Inhale 2 puffs into the lungs 2 (two) times daily as needed (shortness of breath).   valsartan-hydrochlorothiazide 320-12.5 MG tablet Commonly known as:  DIOVAN-HCT Take 1 tablet by mouth daily.   vitamin B-12 500 MCG tablet Commonly known as:  CYANOCOBALAMIN Take 500 mcg by mouth daily.   vitamin E 400 UNIT capsule Take 400 Units by mouth daily.       Review of systems negative except as noted in HPI / PMHx or noted below:  Review of Systems  Constitutional: Negative.   HENT: Negative.   Eyes: Negative.   Respiratory: Negative.   Cardiovascular: Negative.   Gastrointestinal: Negative.   Genitourinary: Negative.   Musculoskeletal: Negative.   Skin: Negative.   Neurological: Negative.   Endo/Heme/Allergies: Negative.   Psychiatric/Behavioral: Negative.     Family History  Problem Relation Age of Onset  . Ovarian cancer Mother   . Cancer - Prostate Father   . Breast cancer Paternal Aunt   . Multiple myeloma Paternal Aunt   . Anesthesia problems  Neg Hx   . Hypotension Neg Hx   . Malignant hyperthermia Neg Hx   . Pseudochol deficiency Neg Hx     Social History   Social History  . Marital status: Divorced    Spouse name: N/A  . Number of children: 1  . Years of education: doctorate   Occupational History  . retired    Social History Main Topics  . Smoking status: Former Smoker    Quit date: 03/15/1999  . Smokeless tobacco: Never Used  . Alcohol use Yes     Comment: wine /w dinner on occas.  . Drug use: No  . Sexual activity: No   Other Topics Concern  . Not on file   Social History Narrative   Patient drinks caffeine occasionally.   Patient is right handed.    Environmental and Social history  Lives in a house with a dry environment, a dog located inside the household, carpeting in the bedroom, no plastic on the bed or pillow, no smokers located inside the household.  Objective:  There were no vitals filed for this visit.      Physical Exam  Constitutional: She is well-developed, well-nourished, and in no distress.  HENT:  Head: Normocephalic.  Right Ear: Tympanic membrane, external ear and ear canal normal.  Left Ear: Tympanic membrane, external ear and ear canal normal.  Nose: Nose normal. No mucosal edema or rhinorrhea.  Mouth/Throat: Uvula is midline, oropharynx is clear and moist and mucous membranes are normal. No oropharyngeal exudate.  Eyes: Conjunctivae are normal.  Neck: Trachea normal. No tracheal tenderness present. No tracheal deviation present. No thyromegaly present.  Cardiovascular: Normal rate, regular rhythm, S1 normal, S2 normal and normal heart sounds.   No murmur heard. Pulmonary/Chest: Breath sounds normal. No stridor. No respiratory distress. She has no wheezes. She has no rales.  Musculoskeletal: She exhibits no edema.  Lymphadenopathy:       Head (right side): No tonsillar adenopathy present.       Head (left side): No tonsillar adenopathy present.    She has no cervical  adenopathy.  Neurological: She is alert. Gait normal.  Skin: No rash noted. She is not diaphoretic. No erythema. Nails show no clubbing.  Psychiatric: Mood and  affect normal.    Diagnostics: Allergy skin tests were not performed.   Spirometry was performed and demonstrated an FEV1 of 1.28 @ 70 % of predicted.Following the administration of nebulized albuterol her FEV1 did not change significantly.  The patient had an Asthma Control Test with the following results: ACT Total Score: 20.     Assessment and Plan:    1. Asthma, well controlled, mild persistent   2. Other allergic rhinitis   3. Seasonal allergic conjunctivitis     1. Treat and prevent inflammation:   A. Arnuity 100 one inhalation 1 time per day  B. OTC Nasacort 1 spray each nostril one time per day  2. If needed:   A. cetirizine/Zyrtec 10 mg tablet one time per day  B. Proventil HFA or similar 2 puffs every 4-6 hours  3. "Action plan" for asthma flare up:   A. increase Arnuity to 2 inhalations twice a day  B. use Proventil HFA if needed  4. Obtain fall flu vaccine  5. Return to clinic in 6 months or earlier if problem  I think the most straightforward way to approach Tina's issue with her respiratory tract is to place her on anti-inflammatory medications for both her upper and lower airway and see what happens over the course of the next 6 months while using this preventative plan. Hopefully this will prevent her from developing significant problems as she goes through seasons of the year. She does sound very atopic and I'm not sure that skin testing is really going to add any additional information at this point in time. Certainly we can reapproach her issue and we can reformulate a plan should the plan mentioned above not be adequate in controlling her condition. She will keep in contact with me as she goes through this next 6 month interval. If she does well I will just see her back in this clinic in 6 months or  earlier if there is a problem.  Jiles Prows, MD [facility]

## 2016-10-03 DIAGNOSIS — M48062 Spinal stenosis, lumbar region with neurogenic claudication: Secondary | ICD-10-CM | POA: Diagnosis not present

## 2016-10-10 ENCOUNTER — Other Ambulatory Visit (INDEPENDENT_AMBULATORY_CARE_PROVIDER_SITE_OTHER): Payer: Medicare Other

## 2016-10-10 DIAGNOSIS — E1165 Type 2 diabetes mellitus with hyperglycemia: Secondary | ICD-10-CM | POA: Diagnosis not present

## 2016-10-10 DIAGNOSIS — E782 Mixed hyperlipidemia: Secondary | ICD-10-CM | POA: Diagnosis not present

## 2016-10-10 DIAGNOSIS — Z794 Long term (current) use of insulin: Secondary | ICD-10-CM

## 2016-10-10 LAB — BASIC METABOLIC PANEL
BUN: 19 mg/dL (ref 6–23)
CALCIUM: 9.9 mg/dL (ref 8.4–10.5)
CO2: 30 mEq/L (ref 19–32)
Chloride: 102 mEq/L (ref 96–112)
Creatinine, Ser: 1.12 mg/dL (ref 0.40–1.20)
GFR: 60.7 mL/min (ref >60.00)
Glucose, Bld: 102 mg/dL — ABNORMAL HIGH (ref 70–99)
POTASSIUM: 3.4 meq/L — AB (ref 3.5–5.1)
SODIUM: 141 meq/L (ref 135–145)

## 2016-10-10 LAB — LDL CHOLESTEROL, DIRECT: LDL DIRECT: 134 mg/dL

## 2016-10-11 LAB — FRUCTOSAMINE: Fructosamine: 322 umol/L — ABNORMAL HIGH (ref 0–285)

## 2016-10-14 ENCOUNTER — Other Ambulatory Visit: Payer: Self-pay | Admitting: Endocrinology

## 2016-10-14 ENCOUNTER — Ambulatory Visit (INDEPENDENT_AMBULATORY_CARE_PROVIDER_SITE_OTHER): Payer: Medicare Other | Admitting: Endocrinology

## 2016-10-14 ENCOUNTER — Other Ambulatory Visit: Payer: Self-pay

## 2016-10-14 ENCOUNTER — Encounter: Payer: Self-pay | Admitting: Endocrinology

## 2016-10-14 VITALS — BP 118/72 | HR 78 | Ht 65.0 in | Wt 174.6 lb

## 2016-10-14 DIAGNOSIS — E1165 Type 2 diabetes mellitus with hyperglycemia: Secondary | ICD-10-CM | POA: Diagnosis not present

## 2016-10-14 DIAGNOSIS — Z794 Long term (current) use of insulin: Secondary | ICD-10-CM

## 2016-10-14 MED ORDER — INSULIN DEGLUDEC 100 UNIT/ML ~~LOC~~ SOPN: PEN_INJECTOR | SUBCUTANEOUS | 1 refills | Status: DC

## 2016-10-14 MED ORDER — POTASSIUM CHLORIDE CRYS ER 10 MEQ PO TBCR: 20.0000 meq | EXTENDED_RELEASE_TABLET | Freq: Two times a day (BID) | ORAL | 3 refills | Status: DC

## 2016-10-14 NOTE — Patient Instructions (Addendum)
Start 24 Tresiba once daily and adjust +/-2 every 4 days to keep am glucose 90-130  Novolog 4-10 in am; 10-12 at lunch and 6-8 at supper based on meal

## 2016-10-14 NOTE — Progress Notes (Signed)
Patient ID: [name], female   DOB: 1940-07-28, 76 y.o.   MRN: [id number]    Reason for Appointment:    follow-up  History of Present Illness    PROBLEM 1: Type 2 diabetes mellitus, date of diagnosis: 1998.   Prior history: She had previously been treated with Byetta, Glumetza, Amaryl, Victoza and  Onglyza However because of inadequate control and intolerance to drugs she was finally given pre-meal insulin along with Amaryl, Glumetza eventually stopped because of side effects and also Lantus added  Recent history:  The insulin regimen is: Novolog 6-8 at breakfast and lunch,  10 units acs   LEVEMIR insulin 8 tid Oral agents: None  Her A1c has been mostly between about 6.8 -7.4,  in April better at 6.6  Current blood sugar patterns and problems with management:  She has checked her blood sugars mostly in the mornings and before noon and sporadically in the afternoon only  Because of her having difficulty sleeping through the night she is sometimes eating at odd times during the night and may have higher readings in the mornings  Because of her inconsistent blood sugars she was told to take Levemir 8 units twice a day but she misunderstood and is taking this 3 times a day with a total of 24 units, previously taking only 16 units  Overall blood sugars are still high  Blood sugars are variably high after meals including after breakfast and lunch and some after evening meal  This is despite her trying to be a little more active  Her diet can be variable in carbohydrate and content  She is trying to take her mealtime insulin usually before eating but not always  She thinks that Amaryl was helping her blood sugar control previously  Had previously tried Victoza which caused nausea  Monitors blood glucose: 1-2 times a day, readings as below Glucometer: One Touch.   Mean values apply above for all meters except median for One Touch  PRE-MEAL Fasting Lunch Dinner  9 PM  Overall  Glucose range: 131-172   142, 129  2 26, 204    Mean/median:     173    POST-MEAL PC Breakfast PC Lunch PC Dinner  Glucose range:  1 42-260  1 42-266    Mean/median:       Meals: 2-3 meals per day at 10 am. 2 pm and 6-8 pm but inconsistent schedule.  Breakfast may be [place] sausage and egg with toast, occasionally oatmeal or cereal   Physical activity: exercise:  Water aerobics 2/7  Certified Diabetes Educator visit: Most recent: 7/13.  Dietician visit: Most recent: 3/13.   Wt Readings from Last 3 Encounters:  10/14/16 174 lb 9.6 oz (79.2 kg)  10/02/16 178 lb (80.7 kg)  08/14/16 178 lb (56.4 kg)   Complications: Neuropathy  LABS:  Lab Results  Component Value Date   HGBA1C 6.6 (H) 08/09/2016   HGBA1C 7.0 (H) 07/17/2016   HGBA1C 7.8 (H) 06/26/2016   Lab Results  Component Value Date   MICROALBUR 1.9 02/22/2016   LDLCALC 155 (H) 05/24/2016   CREATININE 1.12 10/10/2016    Multiple other issues are addressed in review of systems  Lab on 10/10/2016  Component Date Value Ref Range Status  . Sodium 10/10/2016 141  135 - 145 mEq/L Final  . Potassium 10/10/2016 3.4* 3.5 - 5.1 mEq/L Final  . Chloride 10/10/2016 102  96 - 112 mEq/L Final  . CO2 10/10/2016 30  19 -  32 mEq/L Final  . Glucose, Bld 10/10/2016 102* 70 - 99 mg/dL Final  . BUN 10/10/2016 19  6 - 23 mg/dL Final  . Creatinine, Ser 10/10/2016 1.12  0.40 - 1.20 mg/dL Final  . Calcium 10/10/2016 9.9  8.4 - 10.5 mg/dL Final  . GFR 10/10/2016 60.70  >60.00 mL/min Final  . Fructosamine 10/10/2016 322* 0 - 285 umol/L Final   Comment: Published reference interval for apparently healthy subjects between age 66 and 61 is 54 - 285 umol/L and in a poorly controlled diabetic population is 228 - 563 umol/L with a mean of 396 umol/L.   Marland Kitchen Direct LDL 10/10/2016 134.0  mg/dL Final   Optimal:  <100 mg/dLNear or Above Optimal:  100-129 mg/dLBorderline High:  130-159 mg/dLHigh:  160-189 mg/dLVery High:  >190 mg/dL        Allergies as of 10/14/2016      Reactions   Betadine [povidone Iodine] Swelling, Other (See Comments)   Reaction to betadine eye drops   Cymbalta [duloxetine Hcl] Other (See Comments)   Dizziness    Adhesive [tape] Rash   Lipitor [atorvastatin] Rash      Medication List       Accurate as of 10/14/16  1:25 PM. Always use your most recent med list.          acetaminophen 325 MG tablet Commonly known as:  TYLENOL Take 2 tablets (650 mg total) by mouth every 6 (six) hours as needed for mild pain (or Fever >/= 101).   aspirin 81 MG EC tablet Take 50m by mouth once daily   CAL-MAG-ZINC PO Take 1 tablet by mouth daily.   cetirizine 10 MG tablet Commonly known as:  ZYRTEC Take 10 mg by mouth daily.   ezetimibe 10 MG tablet Commonly known as:  ZETIA TAKE 1 TABLET(10 MG) BY MOUTH DAILY   fluticasone 50 MCG/ACT nasal spray Commonly known as:  FLONASE Place 1 spray into both nostrils daily.   Fluticasone Furoate 100 MCG/ACT Aepb Commonly known as:  ARNUITY ELLIPTA Inhale 1 puff into the lungs daily.   gabapentin 300 MG capsule Commonly known as:  NEURONTIN TAKE 1 CAPSULE(300 MG) BY MOUTH THREE TIMES DAILY   glimepiride 2 MG tablet Commonly known as:  AMARYL TAKE 2 TABLETS BY MOUTH EVERY MORNING AND 2 TABLETS EVERY EVENING   ibuprofen 800 MG tablet Commonly known as:  ADVIL,MOTRIN   insulin detemir 100 UNIT/ML injection Commonly known as:  LEVEMIR Inject 0.12 mLs (12 Units total) into the skin at bedtime.   Insulin Pen Needle 32G X 4 MM Misc Commonly known as:  BD PEN NEEDLE NANO U/F USE AS DIRECTED   levothyroxine 25 MCG tablet Commonly known as:  SYNTHROID, LEVOTHROID TAKE 1 TABLET(25 MCG) BY MOUTH DAILY BEFORE BREAKFAST   NOVOLOG FLEXPEN 100 UNIT/ML FlexPen Generic drug:  insulin aspart INJECT 6 UNITS UNDER THE SKIN BEFORE BREAKFAST, 10 UNITS BEFORE LUNCH, AND 9 UNITS BEFORE SUPPER   potassium chloride 10 MEQ tablet Commonly known as:   K-DUR,KLOR-CON Take 2 tablets (20 mEq total) by mouth daily.   PROAIR RESPICLICK 1419(90 Base) MCG/ACT Aepb Generic drug:  Albuterol Sulfate Inhale 2 puffs into the lungs 3 (three) times daily as needed (shortness of breath).   senna-docusate 8.6-50 MG tablet Commonly known as:  Senokot-S Take 1 tablet by mouth 2 (two) times daily.   SYMBICORT 80-4.5 MCG/ACT inhaler Generic drug:  budesonide-formoterol Inhale 2 puffs into the lungs 2 (two) times daily as needed (shortness of  breath).   valsartan-hydrochlorothiazide 320-12.5 MG tablet Commonly known as:  DIOVAN-HCT Take 1 tablet by mouth daily.   vitamin B-12 500 MCG tablet Commonly known as:  CYANOCOBALAMIN Take 500 mcg by mouth daily.   vitamin E 400 UNIT capsule Take 400 Units by mouth daily.       Allergies:  Allergies  Allergen Reactions  . Betadine [Povidone Iodine] Swelling and Other (See Comments)    Reaction to betadine eye drops  . Cymbalta [Duloxetine Hcl] Other (See Comments)    Dizziness   . Adhesive [Tape] Rash  . Lipitor [Atorvastatin] Rash    Past Medical History:  Diagnosis Date  . Anemia    has sickle cell trait  . Anxiety   . Arthritis   . Asthma    has used inhaler in past for asthmatic bronchitis, last time- early 2012  . Complication of anesthesia    wakes up shaking  . Diabetes mellitus   . Dyslipidemia   . Fibromyalgia   . GERD (gastroesophageal reflux disease)    occas. use of  Prilosec  . Heart murmur    sees Dr. Montez Morita, last seen- early 2012  . Hypertension    02/2010- stress test /w PCP  . Hypothyroidism   . Neuromuscular disorder (HCC)    lumbar radiculopathy, lumbago  . Nocturnal leg cramps 09/27/2014  . Pneumonia   . Sickle cell trait ([place])   . Sleep apnea    borderline sleep apnea, states she no longer uses, early 2012- stopped using     Past Surgical History:  Procedure Laterality Date  . ABDOMINAL HYSTERECTOMY    . adb.cyst     ovarian cyst  . BACK SURGERY      2012, 2015 (3 total)  . COLONOSCOPY    . EYE SURGERY     macular degeneration treatment - injections  . OVARIAN CYST SURGERY      Family History  Problem Relation Age of Onset  . Ovarian cancer Mother   . Cancer - Prostate Father   . Breast cancer Paternal Aunt   . Multiple myeloma Paternal Aunt   . Anesthesia problems Neg Hx   . Hypotension Neg Hx   . Malignant hyperthermia Neg Hx   . Pseudochol deficiency Neg Hx     Social History:  reports that she quit smoking about 17 years ago. She has never used smokeless tobacco. She reports that she drinks alcohol. She reports that she does not use drugs.  ROS    NEUROPATHY: She has had tingling infeet and legs and also has pain going down her legs especially at night. She is taking mostly gabapentin with relief She  takes Lyrica only as needed for fibromyalgia   Hyperlipidemia:   The lipid abnormality consists of elevated LDL , high triglyceride and did not tolerate Crestor or lovastatin She thinks they make her cramps worse Diet is usually low in fat meats  She was told to try Zetia and she is not taking this with slight improvement in her LDL but this is still high at 134    Lab Results  Component Value Date   CHOL 227 (H) 05/24/2016   HDL 44.70 05/24/2016   LDLCALC 155 (H) 05/24/2016   LDLDIRECT 134.0 10/10/2016   TRIG 137.0 05/24/2016   CHOLHDL 5 05/24/2016      HYPERTENSION:  Has been present for several years.  Currently controlled with taking Diovan HCT Does monitor at home   Lab Results  Component Value Date  CREATININE 1.12 10/10/2016   BUN 19 10/10/2016   NA 141 10/10/2016   K 3.4 (L) 10/10/2016   CL 102 10/10/2016   CO2 30 10/10/2016     ?  Hypothyroidism Does have a relatively high TSH as of 4/17 and was empirically given Synthroid 25 g, Half tablet daily She thinks initially she may have felt a little better with this but she is still complaining of fatigue  TSH is more normal recently    Lab Results  Component Value Date   TSH 2.14 08/09/2016   TSH 1.437 07/17/2016   TSH 3.62 02/22/2016   FREET4 0.78 02/22/2016         Examination:   BP 118/72   Pulse 78   Ht '5\' 5"'  (1.651 m)   Wt 174 lb 9.6 oz (79.2 kg)   SpO2 96%   BMI 29.05 kg/m   Body mass index is 29.05 kg/m.     Assesment/Plan:   1. DIABETES type 2 with BMI of 28 See history of present illness for detailed discussion of management, blood sugar patterns and problems identified  Although her last A1c was fairly good at 6.6 she is having erratic control and appears to be needing more insulin now She has other factors affecting her blood sugar control including insomnia and depression and variable diet as well as inconsistent eating habits Despite increasing her basal insulin and her taking even 24 units by mistake her blood sugars are not controlled including fasting at times She is not able to get enough insulin to cover her meals adequately also  She had nausea from Victoza and has previously resisted aching brand-name medications because of cost   Insulin changes:  Trial of Tresiba  She'll start with 24 units and adjust every 3-4 days by 2 units to get morning sugars consistently in the target range discussed  Adjust NovoLog based on meal composition and wanted a of carbohydrates, she can increase the doses up to 10-12 units for larger meals  She must take p.m. Novolog before eating  Discussed balanced meals at all times  Consider Invokana on the next visit  She is again reluctant to see the dietitian  She will try to increase frequency of her exercise  3. Hypertension: Blood pressure is well controlled on Diovan HCT   4.   Hyperlipidemia:  Needs to continue her Zetia, discussed that her LDL has improved somewhat  5.  Hypokalemia: Potassium is still relatively low at 3.4 and she will be prescribed potassium, she has not discussed this with PCP and is still only taking OTC potassium.   Discussed importance of restoring potassium to normal especially with her history of cramps       Patient Instructions  Start 24 Tresiba once daily and adjust +/-2 every 4 days to keep am glucose 90-130  Novolog 4-10 in am; 10-12 at lunch and 6-8 at supper based on meal    Counseling time on subjects discussed above is over 50% of today's 25 minute visit   Rochele Lueck 10/14/2016, 1:25 PM       Lab on 10/10/2016  Component Date Value Ref Range Status  . Sodium 10/10/2016 141  135 - 145 mEq/L Final  . Potassium 10/10/2016 3.4* 3.5 - 5.1 mEq/L Final  . Chloride 10/10/2016 102  96 - 112 mEq/L Final  . CO2 10/10/2016 30  19 - 32 mEq/L Final  . Glucose, Bld 10/10/2016 102* 70 - 99 mg/dL Final  . BUN 10/10/2016 19  6 - 23 mg/dL Final  . Creatinine, Ser 10/10/2016 1.12  0.40 - 1.20 mg/dL Final  . Calcium 10/10/2016 9.9  8.4 - 10.5 mg/dL Final  . GFR 10/10/2016 60.70  >60.00 mL/min Final  . Fructosamine 10/10/2016 322* 0 - 285 umol/L Final   Comment: Published reference interval for apparently healthy subjects between age 39 and 30 is 79 - 285 umol/L and in a poorly controlled diabetic population is 228 - 563 umol/L with a mean of 396 umol/L.   Marland Kitchen Direct LDL 10/10/2016 134.0  mg/dL Final   Optimal:  <100 mg/dLNear or Above Optimal:  100-129 mg/dLBorderline High:  130-159 mg/dLHigh:  160-189 mg/dLVery High:  >190 mg/dL

## 2016-10-15 ENCOUNTER — Encounter: Payer: Self-pay | Admitting: Endocrinology

## 2016-10-16 ENCOUNTER — Other Ambulatory Visit: Payer: Self-pay

## 2016-10-16 MED ORDER — INSULIN DEGLUDEC 100 UNIT/ML ~~LOC~~ SOPN: PEN_INJECTOR | SUBCUTANEOUS | 1 refills | Status: DC

## 2016-10-16 MED ORDER — LEVOTHYROXINE SODIUM 25 MCG PO TABS: ORAL_TABLET | ORAL | 3 refills | Status: DC

## 2016-10-17 ENCOUNTER — Other Ambulatory Visit: Payer: Self-pay | Admitting: Endocrinology

## 2016-11-11 ENCOUNTER — Other Ambulatory Visit: Payer: Self-pay | Admitting: Endocrinology

## 2016-11-12 ENCOUNTER — Ambulatory Visit (INDEPENDENT_AMBULATORY_CARE_PROVIDER_SITE_OTHER): Payer: Medicare Other | Admitting: Rheumatology

## 2016-11-12 ENCOUNTER — Encounter: Payer: Self-pay | Admitting: Rheumatology

## 2016-11-12 VITALS — BP 131/68 | HR 79 | Resp 14 | Ht 65.0 in | Wt 177.0 lb

## 2016-11-12 DIAGNOSIS — M170 Bilateral primary osteoarthritis of knee: Secondary | ICD-10-CM | POA: Diagnosis not present

## 2016-11-12 DIAGNOSIS — G4709 Other insomnia: Secondary | ICD-10-CM

## 2016-11-12 DIAGNOSIS — M797 Fibromyalgia: Secondary | ICD-10-CM | POA: Diagnosis not present

## 2016-11-12 DIAGNOSIS — M19042 Primary osteoarthritis, left hand: Secondary | ICD-10-CM

## 2016-11-12 DIAGNOSIS — M461 Sacroiliitis, not elsewhere classified: Secondary | ICD-10-CM

## 2016-11-12 DIAGNOSIS — M19041 Primary osteoarthritis, right hand: Secondary | ICD-10-CM | POA: Diagnosis not present

## 2016-11-12 MED ORDER — DULOXETINE HCL 20 MG PO CPEP: 20.0000 mg | ORAL_CAPSULE | Freq: Every day | ORAL | 2 refills | Status: DC

## 2016-11-12 NOTE — Progress Notes (Signed)
Office Visit Note  Patient: [name]             Date of Birth: 1941-04-07           MRN: [id number]             PCP: Wenda Low, MD Referring: Wenda Low, MD Visit Date: 11/12/2016 Occupation: _0 @    Subjective:  Follow-up (3 mo f/u fms [facility] )   History of Present Illness: [name] is a 76 y.o. female  Prior to that , patient was seen 03/28/2016 for fibromyalgia & left SI jt injection And again on 06/10/2016 for SI joint injection.   Today, pt states she is in a lot of pain. Patient reports that soon after her visit with [place] in February, she ended up having emergency surgery for ruptured disc with Dr. Ellene Route. She recalls her surgery date was 06/25/2016. July 14, 2016, dr. Ellene Route put her in hospital again for myoclonic spasms.  Patient reports that she was taken off many medications thinking that they may be causing some of the myoclonic spasms. In addition, she reports that Dr. Ellene Route told her not to get any more steroid injections as they too may be source of some of her symptoms.  Activities of Daily Living:  Patient reports morning stiffness for 30 minutes.   Patient Reports nocturnal pain.  Difficulty dressing/grooming: Reports Difficulty climbing stairs: Reports Difficulty getting out of chair: Reports Difficulty using hands for taps, buttons, cutlery, and/or writing: Reports   Review of Systems  Constitutional: Negative for fatigue.  HENT: Negative for mouth sores and mouth dryness.   Eyes: Negative for dryness.  Respiratory: Negative for shortness of breath.   Gastrointestinal: Negative for constipation and diarrhea.  Musculoskeletal: Negative for myalgias and myalgias.  Skin: Negative for sensitivity to sunlight.  Psychiatric/Behavioral: Negative for decreased concentration and sleep disturbance.    PMFS History:  Patient Active Problem List   Diagnosis Date Noted  . Myoclonic jerking   . Nausea   . Acute encephalopathy  07/15/2016  . Lumbar radiculopathy 06/27/2016  . Hypothyroidism 06/26/2016  . Labile blood glucose   . Surgery, elective   . Post-operative pain   . Sickle cell trait ([place])   . Acute blood loss anemia   . History of back surgery   . AKI (acute kidney injury) ([place])   . Herniated nucleus pulposus, L2-3 06/25/2016  . Bilateral primary osteoarthritis of knee 03/26/2016  . Osteoarthritis, hand 03/26/2016  . Other insomnia 03/26/2016  . Memory disorder 02/16/2016  . Fibromyalgia 09/27/2014  . Nocturnal leg cramps 09/27/2014  . Neuropathy 03/10/2014  . Allergic rhinitis 03/10/2014  . Osteoporosis 03/10/2014  . Spinal stenosis of lumbar region with radiculopathy 02/28/2014  . Type II or unspecified type diabetes mellitus without mention of complication, uncontrolled 07/08/2013  . Hypokalemia 11/02/2012  . Essential hypertension, benign 11/02/2012  . Diabetes mellitus with neuropathy ([place]) 10/29/2012  . Hyperlipidemia 10/29/2012  . Spondylolisthesis of lumbar region 03/19/2011  . Lumbar radicular pain 03/19/2011    Past Medical History:  Diagnosis Date  . Anemia    has sickle cell trait  . Anxiety   . Arthritis   . Asthma    has used inhaler in past for asthmatic bronchitis, last time- early 2012  . Complication of anesthesia    wakes up shaking  . Diabetes mellitus   . Dyslipidemia   . Fibromyalgia   . GERD (gastroesophageal reflux disease)    occas. use of  Prilosec  . Heart murmur    sees Dr. Spruill, last seen- early 2012  . Hypertension    02/2010- stress test /w PCP  . Hypothyroidism   . Neuromuscular disorder (HCC)    lumbar radiculopathy, lumbago  . Nocturnal leg cramps 09/27/2014  . Pneumonia   . Sickle cell trait (HCC)   . Sleep apnea    borderline sleep apnea, states she no longer uses, early 2012- stopped using     Family History  Problem Relation Age of Onset  . Ovarian cancer Mother   . Cancer - Prostate Father   . Breast cancer Paternal Aunt   .  Multiple myeloma Paternal Aunt   . Anesthesia problems Neg Hx   . Hypotension Neg Hx   . Malignant hyperthermia Neg Hx   . Pseudochol deficiency Neg Hx    Past Surgical History:  Procedure Laterality Date  . ABDOMINAL HYSTERECTOMY    . adb.cyst     ovarian cyst  . BACK SURGERY     2012, 2015 (3 total)  . COLONOSCOPY    . EYE SURGERY     macular degeneration treatment - injections  . OVARIAN CYST SURGERY     Social History   Social History Narrative   Patient drinks caffeine occasionally.   Patient is right handed.     Objective: Vital Signs: BP 131/68 (BP Location: Left Arm, Patient Position: Sitting, Cuff Size: Normal)   Pulse 79   Resp 14   Ht 5' 5" (1.651 m)   Wt 177 lb (80.3 kg)   BMI 29.45 kg/m    Physical Exam  Constitutional: She is oriented to person, place, and time. She appears well-developed and well-nourished.  HENT:  Head: Normocephalic and atraumatic.  Eyes: Pupils are equal, round, and reactive to light. EOM are normal.  Cardiovascular: Normal rate, regular rhythm and normal heart sounds.  Exam reveals no gallop and no friction rub.   No murmur heard. Pulmonary/Chest: Effort normal and breath sounds normal. She has no wheezes. She has no rales.  Abdominal: Soft. Bowel sounds are normal. She exhibits no distension. There is no tenderness. There is no guarding. No hernia.  Musculoskeletal: Normal range of motion. She exhibits no edema, tenderness or deformity.  Lymphadenopathy:    She has no cervical adenopathy.  Neurological: She is alert and oriented to person, place, and time. Coordination normal.  Skin: Skin is warm and dry. Capillary refill takes less than 2 seconds. No rash noted.  Psychiatric: She has a normal mood and affect. Her behavior is normal.  Nursing note and vitals reviewed.    Musculoskeletal Exam:  Full range of motion of all joints Grip strength is equal and strong bilaterally Fiber myalgia tender points are 18 out of 18  positive  CDAI Exam: CDAI Homunculus Exam:   Joint Counts:  CDAI Tender Joint count: 0 CDAI Swollen Joint count: 0  Global Assessments:  Patient Global Assessment: 10 Provider Global Assessment: 10  CDAI Calculated Score: 20    Investigation: No additional findings.   Imaging: No results found.     Speciality Comments: No specialty comments available.    Procedures:  No procedures performed Allergies: Betadine [povidone iodine]; Cymbalta [duloxetine hcl]; Adhesive [tape]; and Lipitor [atorvastatin]   Assessment / Plan:     Visit Diagnoses: Sacroiliitis, not elsewhere classified (HCC)  Fibromyalgia  Bilateral primary osteoarthritis of knee  Primary osteoarthritis of both hands  Other insomnia   Plan: #1: Fibromyalgia. Patient rates her discomfort as   10 on a scale of 0-10.  #2: Fatigue and insomnia. Ongoing pain is causing her trouble resting.  #3: Emergency surgery of ruptured disc by Dr. Ellene Route. Doing well with that at this time. Soon after the surgery, patient had a monoclonal spasm. Patient is unsure what the source of that was.  #4: Patient reports that her PCP took her off of Cymbalta and multiple other medications due to concerns of side effects. Patient states that the Cymbalta has worked well for her in the past. Per her complaint that she is under a lot of pain from her fibromyalgia, she wanted something for pain and we decided together that she would benefit from restarting the Cymbalta. Patient felt that maybe Cymbalta may have caused dizziness in the past but she is uncertain of that. Since there is not many options available, we offered Cymbalta and she is agreeable. Lyrica is not a good option for her since it causes her significant weight gain.  Also, she was using Lyrica on a when necessary basis and we advised her that that's a proper way of using Lyrica We will put her on a low dose of Cymbalta 20 mg every other day for the next few  weeks and she can go to every day if she tolerates it well. Cymbalta 20 mg 1 by mouth daily; 30 day supply with 2 refills  #5 return to clinic in 2 months. Patient is concerned that if she has ongoing pain or any side effects, she wants to be able to discuss this with our office. I offered her 3 month follow-up but she declined and she wanted a 2 month follow-up.  Orders: No orders of the defined types were placed in this encounter.  No orders of the defined types were placed in this encounter.   Face-to-face time spent with patient was 30 minutes. Greater than 50% of time was spent in counseling and coordination of care.  Follow-Up Instructions: No Follow-up on file.   Eliezer Lofts, PA-C  Note - This record has been created using [employer].  Chart creation errors have been sought, but may not always  have been located. Such creation errors do not reflect on  the standard of medical care.

## 2016-11-13 ENCOUNTER — Other Ambulatory Visit: Payer: Self-pay | Admitting: Endocrinology

## 2016-11-14 ENCOUNTER — Telehealth: Payer: Self-pay

## 2016-11-14 NOTE — Telephone Encounter (Signed)
Called patient to let her know her prescription had been sent yesterday and she stated that someone from our office called her and left a message to call us back and schedule an appointment for September for a follow up visit for Dr. Dwyane Dee. I do not see a telephone note about this call. I also do not see a follow up appointment for her. Please call and schedule an appointment for her. In last encounter note it says around 12/14/2016. Thanks!

## 2016-11-15 ENCOUNTER — Telehealth: Payer: Self-pay | Admitting: Rheumatology

## 2016-11-15 ENCOUNTER — Encounter: Payer: Self-pay | Admitting: Neurology

## 2016-11-15 ENCOUNTER — Ambulatory Visit (INDEPENDENT_AMBULATORY_CARE_PROVIDER_SITE_OTHER): Payer: Medicare Other | Admitting: Neurology

## 2016-11-15 ENCOUNTER — Other Ambulatory Visit: Payer: Self-pay | Admitting: Neurology

## 2016-11-15 ENCOUNTER — Ambulatory Visit: Payer: Medicare Other | Admitting: Rheumatology

## 2016-11-15 VITALS — BP 129/84 | HR 89 | Ht 65.0 in | Wt 176.0 lb

## 2016-11-15 DIAGNOSIS — M797 Fibromyalgia: Secondary | ICD-10-CM | POA: Diagnosis not present

## 2016-11-15 DIAGNOSIS — R413 Other amnesia: Secondary | ICD-10-CM | POA: Diagnosis not present

## 2016-11-15 DIAGNOSIS — G4762 Sleep related leg cramps: Secondary | ICD-10-CM | POA: Diagnosis not present

## 2016-11-15 MED ORDER — DONEPEZIL HCL 5 MG PO TABS: 5.0000 mg | ORAL_TABLET | Freq: Every day | ORAL | 1 refills | Status: DC

## 2016-11-15 MED ORDER — BACLOFEN 10 MG PO TABS: 10.0000 mg | ORAL_TABLET | Freq: Every day | ORAL | 1 refills | Status: DC

## 2016-11-15 NOTE — Telephone Encounter (Signed)
Advised patient to discuss switching from gabapentin to Lyrica with the provider who prescribes the gabapentin.  Patient voiced understanding.    Elisabeth Most, Pharm.D., BCPS, CPP Clinical Pharmacist Pager: [phone] Phone: [phone] 11/15/2016 1:40 PM

## 2016-11-15 NOTE — Telephone Encounter (Signed)
Agree 

## 2016-11-15 NOTE — Telephone Encounter (Signed)
Patient left a message on voicemail at 4:56 pm 11/14/16 stating after one Cymbalta tab she had a headache, diarrhea, and vomiting. Patient unable to take meds. Patient request a rx for something different. Please call to advise.

## 2016-11-15 NOTE — Progress Notes (Signed)
Reason for visit: Fibromyalgia  [name] is an 76 y.o. female  History of present illness:  [name] is a 76 year old right-handed black female with a history of fibromyalgia and low back pain, the patient underwent low back surgery in March 2018 by Dr. Ellene [name], she continues to have low back pain unfortunately since the surgery. The patient was seen in the emergency room on 07/14/2016 with polymyoclonus. The patient was taken off of her pain medications and baclofen at that time, she improved from her myoclonus and delirium state. The patient has reported some troubles with memory, she is not on any medications for memory currently. She has had worsening of her nocturnal leg cramps off of the baclofen. The patient had been on baclofen for quite a number of months before March 2018, she never had any side effects such as myoclonus on the drug. She returns to the office today for an evaluation.  Past Medical History:  Diagnosis Date  . Anemia    has sickle cell trait  . Anxiety   . Arthritis   . Asthma    has used inhaler in past for asthmatic bronchitis, last time- early 2012  . Complication of anesthesia    wakes up shaking  . Diabetes mellitus   . Dyslipidemia   . Fibromyalgia   . GERD (gastroesophageal reflux disease)    occas. use of  Prilosec  . Heart murmur    sees Dr. Montez Morita, last seen- early 2012  . Hypertension    02/2010- stress test /w PCP  . Hypothyroidism   . Neuromuscular disorder (HCC)    lumbar radiculopathy, lumbago  . Nocturnal leg cramps 09/27/2014  . Pneumonia   . Sickle cell trait ([place])   . Sleep apnea    borderline sleep apnea, states she no longer uses, early 2012- stopped using     Past Surgical History:  Procedure Laterality Date  . ABDOMINAL HYSTERECTOMY    . adb.cyst     ovarian cyst  . BACK SURGERY     2012, 2015 (3 total)  . COLONOSCOPY    . EYE SURGERY     macular degeneration treatment - injections  . OVARIAN CYST SURGERY       Family History  Problem Relation Age of Onset  . Ovarian cancer Mother   . Cancer - Prostate Father   . Breast cancer Paternal Aunt   . Multiple myeloma Paternal Aunt   . Anesthesia problems Neg Hx   . Hypotension Neg Hx   . Malignant hyperthermia Neg Hx   . Pseudochol deficiency Neg Hx     Social history:  reports that she quit smoking about 17 years ago. She has never used smokeless tobacco. She reports that she drinks alcohol. She reports that she does not use drugs.    Allergies  Allergen Reactions  . Betadine [Povidone Iodine] Swelling and Other (See Comments)    Reaction to betadine eye drops  . Cymbalta [Duloxetine Hcl] Other (See Comments)    Dizziness, diarrhea, headache, irritability  . Adhesive [Tape] Rash  . Lipitor [Atorvastatin] Rash    Medications:  Prior to Admission medications   Medication Sig Start Date End Date Taking? Authorizing Provider  acetaminophen (TYLENOL) 325 MG tablet Take 2 tablets (650 mg total) by mouth every 6 (six) hours as needed for mild pain (or Fever >/= 101). 07/19/16  Yes Rosita Fire, MD  aspirin 81 MG EC tablet Take 70m by mouth once daily 01/16/15  Yes [provider]  Calcium-Magnesium-Zinc (CAL-MAG-ZINC PO) Take 1 tablet by mouth daily.   Yes [provider]  cetirizine (ZYRTEC) 10 MG tablet Take 10 mg by mouth daily.   Yes [provider]  ezetimibe (ZETIA) 10 MG tablet TAKE 1 TABLET(10 MG) BY MOUTH DAILY 08/16/16  Yes Renato Shin, MD  fluticasone [facility]) 50 MCG/ACT nasal spray Place 1 spray into both nostrils daily.   Yes [provider]  Fluticasone Furoate (ARNUITY ELLIPTA) 100 MCG/ACT AEPB Inhale 1 puff into the lungs daily. 10/01/16  Yes Kozlow, Donnamarie Poag, MD  gabapentin (NEURONTIN) 300 MG capsule TAKE 1 CAPSULE(300 MG) BY MOUTH THREE TIMES DAILY 07/03/16  Yes Angiulli, Lavon Paganini, PA-C  ibuprofen (ADVIL,MOTRIN) 800 MG tablet  08/09/16  Yes [provider]  insulin degludec  (TRESIBA FLEXTOUCH) 100 UNIT/ML SOPN FlexTouch Pen Use 24 units daily as discussed. 10/16/16  Yes [name], MD  Insulin Pen Needle (BD PEN NEEDLE NANO U/F) 32G X 4 MM MISC USE AS DIRECTED 07/03/16  Yes Angiulli, Lavon Paganini, PA-C  levothyroxine (SYNTHROID, LEVOTHROID) 25 MCG tablet TAKE 1 TABLET(25 MCG) BY MOUTH DAILY BEFORE BREAKFAST 10/16/16  Yes [name], MD  NOVOLOG FLEXPEN 100 UNIT/ML FlexPen INJECT 6 UNITS UNDER THE SKIN BEFORE BREAKFAST, 10 UNITS BEFORE LUNCH, AND 9 UNITS BEFORE SUPPER 10/17/16  Yes [name], MD  potassium chloride (K-DUR,KLOR-CON) 10 MEQ tablet Take 2 tablets (20 mEq total) by mouth 2 (two) times daily. 10/14/16  Yes [name], MD  PROAIR RESPICLICK 160 [phone] Base) MCG/ACT AEPB Inhale 2 puffs into the lungs 3 (three) times daily as needed (shortness of breath).  05/29/16  Yes [provider]  senna-docusate (SENOKOT-S) 8.6-50 MG tablet Take 1 tablet by mouth 2 (two) times daily. 07/03/16  Yes Angiulli, Lavon Paganini, PA-C  SYMBICORT 80-4.5 MCG/ACT inhaler Inhale 2 puffs into the lungs 2 (two) times daily as needed (shortness of breath).  05/29/16  Yes [provider]  valsartan-hydrochlorothiazide (DIOVAN-HCT) 320-12.5 MG tablet Take 1 tablet by mouth daily. 07/03/16  Yes Angiulli, Lavon Paganini, PA-C  vitamin B-12 (CYANOCOBALAMIN) 500 MCG tablet Take 500 mcg by mouth daily.   Yes [provider]  vitamin E 400 UNIT capsule Take 400 Units by mouth daily.   Yes [provider]    ROS:  Out of a complete 14 system review of symptoms, the patient complains only of the following symptoms, and all other reviewed systems are negative.  Memory disturbance Low back pain, leg cramps  Blood pressure 129/84, pulse 89, height '5\' 5"'  (1.651 m), weight 176 lb (79.8 kg).  Physical Exam  General: The patient is alert and cooperative at the time of the examination.  Skin: No significant peripheral edema is noted.   Neurologic Exam  Mental status: The patient  is alert and oriented x 3 at the time of the examination. The patient has apparent normal recent and remote memory, with an apparently normal attention span and concentration ability. Mini-Mental Status Examination done today shows a total score 28/30.   Cranial nerves: Facial symmetry is present. Speech is normal, no aphasia or dysarthria is noted. Extraocular movements are full. Visual fields are full.  Motor: The patient has good strength in all 4 extremities.  Sensory examination: Soft touch sensation is symmetric on the face, arms, and legs.  Coordination: The patient has good finger-nose-finger and heel-to-shin bilaterally.  Gait and station: The patient has a slow, slightly wide-based gait. The patient walks with a cane. Tandem gait  was not attempted. Romberg is negative. No drift is seen.  Reflexes: Deep tendon reflexes are symmetric.   Assessment/Plan:  1. Fibromyalgia  2. Chronic low back pain  3. Nocturnal leg cramps  4. Mild memory disturbance  The patient will be placed on baclofen at a 10 mg dose at night. She did well with this in the past. This did help her nocturnal leg cramps. The patient will be placed on low-dose Aricept taking 5 mg at night. She will call in one month if she is tolerating the drug and we will go up on the dose. The patient will follow-up in 6 months.  Jill Alexanders MD 11/15/2016 11:20 AM  [facility] [address] [place], [place] [address]  Phone [phone] Fax [phone]

## 2016-11-15 NOTE — Telephone Encounter (Signed)
Spoke with the pt let her know of the 2 wk -3 days prior requirement

## 2016-11-15 NOTE — Telephone Encounter (Signed)
Patient reports she is unable to tolerate Cymbalta (see above) and is requesting a prescription for something else.  I called patient to discuss.  She confirms she is currently taking gabapentin 300 mg TID prescribed by her endocrinologist.  She asked about starting back on Lyrica as that helped her fibromyalgia pain more.  I informed Lyrica and gabapentin cannot be taken together.  She wants to consider switching from gabapentin to Lyrica.  I told her you would probably want her to discuss this with her endocrinologist since they prescribe gabapentin.  Please advise.    Elisabeth Most, Pharm.D., BCPS, CPP Clinical Pharmacist Pager: [phone] Phone: [phone] 11/15/2016 11:13 AM

## 2016-11-15 NOTE — Patient Instructions (Signed)
   We will start back on the baclofen 10 mg at night.    We will start aricept 5 mg tablet for memory.  Begin Aricept (donepezil) at 5 mg at night for one month. If this medication is well-tolerated, please call our office and we will call in a prescription for the 10 mg tablets. Look out for side effects that may include nausea, diarrhea, weight loss, or stomach cramps. This medication will also cause a runny nose, therefore there is no need for allergy medications for this purpose.

## 2016-11-20 DIAGNOSIS — I10 Essential (primary) hypertension: Secondary | ICD-10-CM | POA: Diagnosis not present

## 2016-11-20 DIAGNOSIS — Z794 Long term (current) use of insulin: Secondary | ICD-10-CM | POA: Diagnosis not present

## 2016-11-20 DIAGNOSIS — E1142 Type 2 diabetes mellitus with diabetic polyneuropathy: Secondary | ICD-10-CM | POA: Diagnosis not present

## 2016-11-20 DIAGNOSIS — L821 Other seborrheic keratosis: Secondary | ICD-10-CM | POA: Diagnosis not present

## 2016-11-23 ENCOUNTER — Encounter: Payer: Self-pay | Admitting: Endocrinology

## 2016-11-29 NOTE — Telephone Encounter (Signed)
Patient is scheduled Aug 29th at 9:45am. Does she need labs done as well?  Thank you,  -LL

## 2016-12-18 ENCOUNTER — Encounter: Payer: Self-pay | Admitting: Endocrinology

## 2016-12-18 ENCOUNTER — Ambulatory Visit (INDEPENDENT_AMBULATORY_CARE_PROVIDER_SITE_OTHER): Payer: Medicare Other | Admitting: Endocrinology

## 2016-12-18 VITALS — BP 138/74 | HR 76 | Ht 65.0 in | Wt 170.8 lb

## 2016-12-18 DIAGNOSIS — E1165 Type 2 diabetes mellitus with hyperglycemia: Secondary | ICD-10-CM

## 2016-12-18 DIAGNOSIS — Z794 Long term (current) use of insulin: Secondary | ICD-10-CM | POA: Diagnosis not present

## 2016-12-18 DIAGNOSIS — E782 Mixed hyperlipidemia: Secondary | ICD-10-CM

## 2016-12-18 LAB — COMPREHENSIVE METABOLIC PANEL
ALT: 12 U/L (ref 0–35)
AST: 18 U/L (ref 0–37)
Albumin: 4.2 g/dL (ref 3.5–5.2)
Alkaline Phosphatase: 72 U/L (ref 39–117)
BUN: 16 mg/dL (ref 6–23)
CALCIUM: 10 mg/dL (ref 8.4–10.5)
CHLORIDE: 104 meq/L (ref 96–112)
CO2: 32 meq/L (ref 19–32)
CREATININE: 1.07 mg/dL (ref 0.40–1.20)
GFR: 63.95 mL/min (ref >60.00)
Glucose, Bld: 142 mg/dL — ABNORMAL HIGH (ref 70–99)
POTASSIUM: 3.6 meq/L (ref 3.5–5.1)
SODIUM: 142 meq/L (ref 135–145)
Total Bilirubin: 0.4 mg/dL (ref 0.2–1.2)
Total Protein: 7 g/dL (ref 6.0–8.3)

## 2016-12-18 LAB — LDL CHOLESTEROL, DIRECT: Direct LDL: 115 mg/dL

## 2016-12-18 LAB — TSH: TSH: 3.41 u[IU]/mL (ref 0.35–4.50)

## 2016-12-18 LAB — HEMOGLOBIN A1C: Hgb A1c MFr Bld: 6.2 % (ref 4.6–6.5)

## 2016-12-18 NOTE — Progress Notes (Signed)
Patient ID: [name], female   DOB: 08/17/1940, 76 y.o.   MRN: [id number]    Reason for Appointment:    follow-up  History of Present Illness    PROBLEM 1: Type 2 diabetes mellitus, date of diagnosis: 1998.   Prior history: She had previously been treated with Byetta, Glumetza, Amaryl, Victoza and  Onglyza However because of inadequate control and intolerance to drugs she was finally given pre-meal insulin along with Amaryl, Glumetza eventually stopped because of side effects and also Lantus added  Recent history:  The insulin regimen is: Novolog 6 at breakfast and lunch, 8 units acs Tresiba 24 at lunch  Oral agents: None  Her A1c has been mostly between about 6.8 -7.4, last level in April better at 6.6  Current blood sugar patterns and problems with management:  She has started taking TRESIBA since her last visit although she does not remember when exactly this was changed from her Levemir which is likely not getting her consistent control or requiring twice a day dosage  Also she thinks that she is probably doing somewhat better with her diet especially trying to cut back on carbohydrates at her dinner meal  FASTING blood sugars recently have been fairly consistent  She did have a low sugar at 1 AM about 3 weeks ago and not clear why  Instead occasionally she will forget to take her [place] which she was trying to take in the early afternoon but now is starting to do this in the morning  With forgetting the insulin she may have an occasional high reading  She does however appear to be taking similar amounts of mealtime insulin even with her cutting back on carbohydrate intake  Has not done any postprandial reading over the last 7-10 days   Had previously tried Victoza which caused nausea  Monitors blood glucose: 1-2 times a day, readings as below Glucometer: One Touch.    Mean values apply above for all meters except median for One Touch  PRE-MEAL  Fasting Lunch Dinner Bedtime Overall  Glucose range: 77-270  11 7-217    53-264    Mean/median: 110  141  139  101  117    Meals: 2-3 meals per day at 10 am. 2 pm and 6-8 pm but inconsistent schedule.  Breakfast may be [place] sausage and egg with toast, occasionally oatmeal or cereal  Her dinner may not have any carbohydrate  Physical activity: exercise:  Water aerobics 3/7 days a week  Certified Diabetes Educator visit: Most recent: 7/13.  Dietician visit: Most recent: 3/13.   Wt Readings from Last 3 Encounters:  12/18/16 170 lb 12.8 oz (77.5 kg)  11/15/16 176 lb (79.8 kg)  11/12/16 177 lb (02.7 kg)   Complications: Neuropathy  LABS:  Lab Results  Component Value Date   HGBA1C 6.6 (H) 08/09/2016   HGBA1C 7.0 (H) 07/17/2016   HGBA1C 7.8 (H) 06/26/2016   Lab Results  Component Value Date   MICROALBUR 1.9 02/22/2016   LDLCALC 155 (H) 05/24/2016   CREATININE 1.12 10/10/2016    Multiple other issues are addressed in review of systems  No visits with results within 1 Week(s) from this visit.  Latest known visit with results is:  Lab on 10/10/2016  Component Date Value Ref Range Status  . Sodium 10/10/2016 141  135 - 145 mEq/L Final  . Potassium 10/10/2016 3.4* 3.5 - 5.1 mEq/L Final  . Chloride 10/10/2016 102  96 - 112 mEq/L Final  .  CO2 10/10/2016 30  19 - 32 mEq/L Final  . Glucose, Bld 10/10/2016 102* 70 - 99 mg/dL Final  . BUN 10/10/2016 19  6 - 23 mg/dL Final  . Creatinine, Ser 10/10/2016 1.12  0.40 - 1.20 mg/dL Final  . Calcium 10/10/2016 9.9  8.4 - 10.5 mg/dL Final  . GFR 10/10/2016 60.70  >60.00 mL/min Final  . Fructosamine 10/10/2016 322* 0 - 285 umol/L Final   Comment: Published reference interval for apparently healthy subjects between age 81 and 47 is 41 - 285 umol/L and in a poorly controlled diabetic population is 228 - 563 umol/L with a mean of 396 umol/L.   Marland Kitchen Direct LDL 10/10/2016 134.0  mg/dL Final   Optimal:  <100 mg/dLNear or Above Optimal:   100-129 mg/dLBorderline High:  130-159 mg/dLHigh:  160-189 mg/dLVery High:  >190 mg/dL      Allergies as of 12/18/2016      Reactions   Betadine [povidone Iodine] Swelling, Other (See Comments)   Reaction to betadine eye drops   Cymbalta [duloxetine Hcl] Other (See Comments)   Dizziness, diarrhea, headache, irritability   Adhesive [tape] Rash   Lipitor [atorvastatin] Rash      Medication List       Accurate as of 12/18/16 10:14 AM. Always use your most recent med list.          acetaminophen 325 MG tablet Commonly known as:  TYLENOL Take 2 tablets (650 mg total) by mouth every 6 (six) hours as needed for mild pain (or Fever >/= 101).   aspirin 81 MG EC tablet Take 71m by mouth once daily   baclofen 10 MG tablet Commonly known as:  LIORESAL Take 1 tablet (10 mg total) by mouth at bedtime.   CAL-MAG-ZINC PO Take 1 tablet by mouth daily.   cetirizine 10 MG tablet Commonly known as:  ZYRTEC Take 10 mg by mouth daily.   donepezil 5 MG tablet Commonly known as:  ARICEPT Take 1 tablet (5 mg total) by mouth at bedtime.   ezetimibe 10 MG tablet Commonly known as:  ZETIA TAKE 1 TABLET(10 MG) BY MOUTH DAILY   fluticasone 50 MCG/ACT nasal spray Commonly known as:  FLONASE Place 1 spray into both nostrils daily.   Fluticasone Furoate 100 MCG/ACT Aepb Commonly known as:  ARNUITY ELLIPTA Inhale 1 puff into the lungs daily.   gabapentin 300 MG capsule Commonly known as:  NEURONTIN TAKE 1 CAPSULE(300 MG) BY MOUTH THREE TIMES DAILY   ibuprofen 800 MG tablet Commonly known as:  ADVIL,MOTRIN   insulin degludec 100 UNIT/ML Sopn FlexTouch Pen Commonly known as:  TRESIBA FLEXTOUCH Use 24 units daily as discussed.   Insulin Pen Needle 32G X 4 MM Misc Commonly known as:  BD PEN NEEDLE NANO U/F USE AS DIRECTED   levothyroxine 25 MCG tablet Commonly known as:  SYNTHROID, LEVOTHROID TAKE 1 TABLET(25 MCG) BY MOUTH DAILY BEFORE BREAKFAST   NOVOLOG FLEXPEN 100 UNIT/ML  FlexPen Generic drug:  insulin aspart INJECT 6 UNITS UNDER THE SKIN BEFORE BREAKFAST, 10 UNITS BEFORE LUNCH, AND 9 UNITS BEFORE SUPPER   potassium chloride 10 MEQ tablet Commonly known as:  K-DUR,KLOR-CON Take 2 tablets (20 mEq total) by mouth 2 (two) times daily.   PROAIR RESPICLICK 1253(90 Base) MCG/ACT Aepb Generic drug:  Albuterol Sulfate Inhale 2 puffs into the lungs 3 (three) times daily as needed (shortness of breath).   senna-docusate 8.6-50 MG tablet Commonly known as:  Senokot-S Take 1 tablet by mouth 2 (two)  times daily.   SYMBICORT 80-4.5 MCG/ACT inhaler Generic drug:  budesonide-formoterol Inhale 2 puffs into the lungs 2 (two) times daily as needed (shortness of breath).   valsartan-hydrochlorothiazide 320-12.5 MG tablet Commonly known as:  DIOVAN-HCT Take 1 tablet by mouth daily.   vitamin B-12 500 MCG tablet Commonly known as:  CYANOCOBALAMIN Take 500 mcg by mouth daily.   vitamin E 400 UNIT capsule Take 400 Units by mouth daily.       Allergies:  Allergies  Allergen Reactions  . Betadine [Povidone Iodine] Swelling and Other (See Comments)    Reaction to betadine eye drops  . Cymbalta [Duloxetine Hcl] Other (See Comments)    Dizziness, diarrhea, headache, irritability  . Adhesive [Tape] Rash  . Lipitor [Atorvastatin] Rash    Past Medical History:  Diagnosis Date  . Anemia    has sickle cell trait  . Anxiety   . Arthritis   . Asthma    has used inhaler in past for asthmatic bronchitis, last time- early 2012  . Complication of anesthesia    wakes up shaking  . Diabetes mellitus   . Dyslipidemia   . Fibromyalgia   . GERD (gastroesophageal reflux disease)    occas. use of  Prilosec  . Heart murmur    sees Dr. Montez Morita, last seen- early 2012  . Hypertension    02/2010- stress test /w PCP  . Hypothyroidism   . Neuromuscular disorder (HCC)    lumbar radiculopathy, lumbago  . Nocturnal leg cramps 09/27/2014  . Pneumonia   . Sickle cell trait  ([place])   . Sleep apnea    borderline sleep apnea, states she no longer uses, early 2012- stopped using     Past Surgical History:  Procedure Laterality Date  . ABDOMINAL HYSTERECTOMY    . adb.cyst     ovarian cyst  . BACK SURGERY     2012, 2015 (3 total)  . COLONOSCOPY    . EYE SURGERY     macular degeneration treatment - injections  . OVARIAN CYST SURGERY      Family History  Problem Relation Age of Onset  . Ovarian cancer Mother   . Cancer - Prostate Father   . Breast cancer Paternal Aunt   . Multiple myeloma Paternal Aunt   . Anesthesia problems Neg Hx   . Hypotension Neg Hx   . Malignant hyperthermia Neg Hx   . Pseudochol deficiency Neg Hx     Social History:  reports that she quit smoking about 17 years ago. She has never used smokeless tobacco. She reports that she drinks alcohol. She reports that she does not use drugs.  ROS    NEUROPATHY: She has had tingling infeet and legs and also has pain going down her legs especially at night. She is taking mostly gabapentin with relief She  takes Lyrica only as needed for fibromyalgia   Hyperlipidemia:   The lipid abnormality consists of elevated LDL , high triglyceride and did not tolerate Crestor or lovastatin She thinks they make her cramps worse Diet is usually low in fat meats  She was told to try Zetia and Has had slight improvement in her LDL      Lab Results  Component Value Date   CHOL 227 (H) 05/24/2016   HDL 44.70 05/24/2016   LDLCALC 155 (H) 05/24/2016   LDLDIRECT 134.0 10/10/2016   TRIG 137.0 05/24/2016   CHOLHDL 5 05/24/2016      HYPERTENSION:  Has been present for several years.  This is controlled with taking Diovan HCT Does monitor at home   Lab Results  Component Value Date   CREATININE 1.12 10/10/2016   BUN 19 10/10/2016   NA 141 10/10/2016   K 3.4 (L) 10/10/2016   CL 102 10/10/2016   CO2 30 10/10/2016       Hypothyroidism  She had a relatively high TSH as of 4/17 and was  empirically given Synthroid 25 g Although initially was on half a tablet only is not taking the full tablet as she does not think it causes palpitations now Not clear if her fatigue had improved with this  TSH is pending   Lab Results  Component Value Date   TSH 2.14 08/09/2016   TSH 1.437 07/17/2016   TSH 3.62 02/22/2016   FREET4 0.78 02/22/2016         Examination:   BP 138/74   Pulse 76   Ht '5\' 5"'  (1.651 m)   Wt 170 lb 12.8 oz (77.5 kg)   SpO2 94%   BMI 28.42 kg/m   Body mass index is 28.42 kg/m.     Assesment/Plan:   1. DIABETES type 2 with BMI of 28 See history of present illness for detailed discussion of management, blood sugar patterns and problems identified  Her blood sugars are overall considerably better than on her last visit She is clearly doing better with [place] compared to Levemir Fasting readings are mostly consistently normal and overall has much less fluctuation She is also trying to be seen with diet and watch and carbohydrates On review of her diet she does not eat much carbohydrate at dinnertime with still may take as much as 8 units of Novolog Currently not getting hypoglycemic with her NovoLog regimen She will continue the same doses for now but check more readings after meals    3. Hypertension: Blood pressure is well controlled on Diovan HCT   4.   Hyperlipidemia:  Needs to continue her Zetia,Check lipids today  5.  Hypokalemia: PotassiumWill need to be rechecked         There are no Patient Instructions on file for this visit.   Counseling time on subjects discussed above is over 50% of today's 25 minute visit   Ardeth Repetto 12/18/2016, 10:14 AM

## 2016-12-19 NOTE — Progress Notes (Signed)
Please call to let patient know that the A1c 6.2, cholesterol is better and potassium is normal

## 2016-12-21 DIAGNOSIS — R195 Other fecal abnormalities: Secondary | ICD-10-CM

## 2016-12-21 HISTORY — DX: Other fecal abnormalities: R19.5

## 2016-12-24 ENCOUNTER — Encounter: Payer: Self-pay | Admitting: Endocrinology

## 2016-12-24 NOTE — Progress Notes (Unsigned)
-----   Message from [name] sent at 12/24/2016 12:06 PM EDT -----      ----- Message from [name] to [name], MD sent at 12/24/2016 11:13 AM -----   URGENT! I have had dysentery for over 2 weeks. I forgot to tell you when I had my appointment. I thought it was because I was on the [place]. However, I have been off the diet since August 29, and I still have the condition. I think it is because of the Tresiba--that is the only new thing in my diet. I thought it was lactose or milk in my coffee, but I stopped that and the condition remains. It is time for a refill on the Tresiba and I am wondering whether I should get something else. I called the office, but was put on hold for a long time so I'm trying my chart. I'm due to pick-up a refill on Tresiba today, but I don't know whether I should. SHOULD I CONTINUE TAKING TRESIBA? [facility]

## 2016-12-24 NOTE — Telephone Encounter (Signed)
Patient needs a call as soon as possible to discuss if the insulin degludec (TRESIBA FLEXTOUCH) 100 UNIT/ML SOPN FlexTouch Pen is causing her extreme diarrhea. Call patient to advise. See mychart message also

## 2016-12-26 ENCOUNTER — Telehealth: Payer: Self-pay

## 2016-12-26 ENCOUNTER — Other Ambulatory Visit: Payer: Self-pay

## 2016-12-26 ENCOUNTER — Encounter: Payer: Self-pay | Admitting: Endocrinology

## 2016-12-26 MED ORDER — GLUCOSE BLOOD VI STRP: ORAL_STRIP | 2 refills | Status: DC

## 2016-12-26 NOTE — Telephone Encounter (Signed)
Called patient and she is interested in the [employer]. She is currently using the One Touch Ultra II test strips and checks 3 times daily. I will send her a 30 day prescription to the [facility] on [address].  Please advise if you want her to proceed with the [place].

## 2016-12-29 NOTE — Telephone Encounter (Signed)
Medicare requirements are for testing 4 times a day for [facility] approval

## 2016-12-30 ENCOUNTER — Other Ambulatory Visit: Payer: Self-pay

## 2016-12-30 MED ORDER — GLUCOSE BLOOD VI STRP: ORAL_STRIP | 2 refills | Status: DC

## 2016-12-30 NOTE — Telephone Encounter (Signed)
Patient called explaining that she was frustrated due to still not receiving any test strips and almost being out. Patient states that she was unable to get the strips from [facility]. Patient wants for the nurse or Dr. Dwyane Dee to call one of the companies so that she can successfully get her test strips. Call patient to update on the order.

## 2016-12-30 NOTE — Telephone Encounter (Signed)
Called patient and she was not able to pick up her meter from the [facility] on [address] and she wanted the script to go to [facility]  on [address] ( [place] I have sent in this new prescription for her. She wants a company to send to her home but the companies that are provided on the [employer] list are unfamiliar to [place]. She asked if we could call for her. I provided her with [facility] and [facility] phone numbers for her to call to see if either of these will send her the One Touch blood glucose strips. She will call back to let me know if they do.

## 2016-12-31 DIAGNOSIS — L821 Other seborrheic keratosis: Secondary | ICD-10-CM | POA: Diagnosis not present

## 2017-01-01 NOTE — Telephone Encounter (Signed)
Pt's pharmacy needs the test strips script to be resent with the dx code on it please asap

## 2017-01-02 ENCOUNTER — Other Ambulatory Visit: Payer: Self-pay

## 2017-01-02 MED ORDER — GLUCOSE BLOOD VI STRP: ORAL_STRIP | 2 refills | Status: DC

## 2017-01-09 DIAGNOSIS — K5730 Diverticulosis of large intestine without perforation or abscess without bleeding: Secondary | ICD-10-CM | POA: Diagnosis not present

## 2017-01-09 DIAGNOSIS — R194 Change in bowel habit: Secondary | ICD-10-CM | POA: Diagnosis not present

## 2017-01-09 DIAGNOSIS — R140 Abdominal distension (gaseous): Secondary | ICD-10-CM | POA: Diagnosis not present

## 2017-01-09 DIAGNOSIS — Z86010 Personal history of colonic polyps: Secondary | ICD-10-CM | POA: Diagnosis not present

## 2017-01-13 ENCOUNTER — Other Ambulatory Visit (INDEPENDENT_AMBULATORY_CARE_PROVIDER_SITE_OTHER): Payer: Medicare Other

## 2017-01-13 DIAGNOSIS — E1165 Type 2 diabetes mellitus with hyperglycemia: Secondary | ICD-10-CM

## 2017-01-13 DIAGNOSIS — Z794 Long term (current) use of insulin: Secondary | ICD-10-CM | POA: Diagnosis not present

## 2017-01-13 LAB — BASIC METABOLIC PANEL
BUN: 15 mg/dL (ref 6–23)
CHLORIDE: 104 meq/L (ref 96–112)
CO2: 32 mEq/L (ref 19–32)
Calcium: 9.7 mg/dL (ref 8.4–10.5)
Creatinine, Ser: 0.86 mg/dL (ref 0.40–1.20)
GFR: 82.27 mL/min (ref >60.00)
GLUCOSE: 133 mg/dL — AB (ref 70–99)
POTASSIUM: 3.7 meq/L (ref 3.5–5.1)
SODIUM: 143 meq/L (ref 135–145)

## 2017-01-13 LAB — MICROALBUMIN / CREATININE URINE RATIO
Creatinine,U: 137.6 mg/dL
Microalb Creat Ratio: 0.5 mg/g (ref 0.0–30.0)

## 2017-01-13 LAB — HEMOGLOBIN A1C: Hgb A1c MFr Bld: 6.2 % (ref 4.6–6.5)

## 2017-01-16 ENCOUNTER — Encounter: Payer: Self-pay | Admitting: Endocrinology

## 2017-01-16 ENCOUNTER — Encounter: Payer: Medicare Other | Admitting: Endocrinology

## 2017-01-16 NOTE — Progress Notes (Signed)
  ROS  This encounter was created in error - please disregard. 

## 2017-01-20 ENCOUNTER — Other Ambulatory Visit: Payer: Self-pay | Admitting: Endocrinology

## 2017-01-20 NOTE — Telephone Encounter (Signed)
Routing...

## 2017-01-21 ENCOUNTER — Encounter: Payer: Self-pay | Admitting: Endocrinology

## 2017-01-26 ENCOUNTER — Other Ambulatory Visit: Payer: Self-pay | Admitting: Endocrinology

## 2017-01-27 ENCOUNTER — Encounter: Payer: Medicare Other | Admitting: Allergy and Immunology

## 2017-01-30 DIAGNOSIS — M47816 Spondylosis without myelopathy or radiculopathy, lumbar region: Secondary | ICD-10-CM | POA: Diagnosis not present

## 2017-01-30 DIAGNOSIS — M48062 Spinal stenosis, lumbar region with neurogenic claudication: Secondary | ICD-10-CM | POA: Diagnosis not present

## 2017-02-03 ENCOUNTER — Other Ambulatory Visit: Payer: Self-pay | Admitting: Neurological Surgery

## 2017-02-03 DIAGNOSIS — M47816 Spondylosis without myelopathy or radiculopathy, lumbar region: Secondary | ICD-10-CM

## 2017-02-13 ENCOUNTER — Other Ambulatory Visit: Payer: Self-pay | Admitting: Endocrinology

## 2017-02-13 DIAGNOSIS — D573 Sickle-cell trait: Secondary | ICD-10-CM | POA: Diagnosis not present

## 2017-02-13 DIAGNOSIS — J309 Allergic rhinitis, unspecified: Secondary | ICD-10-CM | POA: Diagnosis not present

## 2017-02-13 DIAGNOSIS — M79673 Pain in unspecified foot: Secondary | ICD-10-CM | POA: Diagnosis not present

## 2017-02-13 DIAGNOSIS — K760 Fatty (change of) liver, not elsewhere classified: Secondary | ICD-10-CM | POA: Diagnosis not present

## 2017-02-13 DIAGNOSIS — Z1389 Encounter for screening for other disorder: Secondary | ICD-10-CM | POA: Diagnosis not present

## 2017-02-13 DIAGNOSIS — I10 Essential (primary) hypertension: Secondary | ICD-10-CM | POA: Diagnosis not present

## 2017-02-13 DIAGNOSIS — M810 Age-related osteoporosis without current pathological fracture: Secondary | ICD-10-CM | POA: Diagnosis not present

## 2017-02-13 DIAGNOSIS — E039 Hypothyroidism, unspecified: Secondary | ICD-10-CM | POA: Diagnosis not present

## 2017-02-13 DIAGNOSIS — Z23 Encounter for immunization: Secondary | ICD-10-CM | POA: Diagnosis not present

## 2017-02-13 DIAGNOSIS — E1142 Type 2 diabetes mellitus with diabetic polyneuropathy: Secondary | ICD-10-CM | POA: Diagnosis not present

## 2017-02-13 DIAGNOSIS — Z0000 Encounter for general adult medical examination without abnormal findings: Secondary | ICD-10-CM | POA: Diagnosis not present

## 2017-02-13 DIAGNOSIS — E7800 Pure hypercholesterolemia, unspecified: Secondary | ICD-10-CM | POA: Diagnosis not present

## 2017-02-13 DIAGNOSIS — M519 Unspecified thoracic, thoracolumbar and lumbosacral intervertebral disc disorder: Secondary | ICD-10-CM | POA: Diagnosis not present

## 2017-02-18 ENCOUNTER — Ambulatory Visit
Admission: RE | Admit: 2017-02-18 | Discharge: 2017-02-18 | Disposition: A | Discharge status: Home / Self Care | Payer: Medicare Other | Source: Ambulatory Visit | Attending: Neurological Surgery | Admitting: Neurological Surgery

## 2017-02-18 DIAGNOSIS — M47816 Spondylosis without myelopathy or radiculopathy, lumbar region: Secondary | ICD-10-CM

## 2017-02-18 DIAGNOSIS — M48061 Spinal stenosis, lumbar region without neurogenic claudication: Secondary | ICD-10-CM | POA: Diagnosis not present

## 2017-02-18 MED ORDER — GADOBENATE DIMEGLUMINE 529 MG/ML IV SOLN
15.0000 mL | Freq: Once | INTRAVENOUS | Status: AC | PRN
  Administered 2017-02-18: 15 mL via INTRAVENOUS

## 2017-02-19 ENCOUNTER — Ambulatory Visit: Payer: Medicare Other | Admitting: Podiatry

## 2017-02-19 DIAGNOSIS — M48062 Spinal stenosis, lumbar region with neurogenic claudication: Secondary | ICD-10-CM | POA: Diagnosis not present

## 2017-02-20 ENCOUNTER — Other Ambulatory Visit: Payer: Self-pay | Admitting: Endocrinology

## 2017-02-21 ENCOUNTER — Other Ambulatory Visit: Payer: Self-pay | Admitting: Neurological Surgery

## 2017-03-03 ENCOUNTER — Ambulatory Visit (INDEPENDENT_AMBULATORY_CARE_PROVIDER_SITE_OTHER): Payer: Medicare Other | Admitting: Podiatry

## 2017-03-03 ENCOUNTER — Encounter: Payer: Self-pay | Admitting: Podiatry

## 2017-03-03 ENCOUNTER — Ambulatory Visit: Payer: Medicare Other

## 2017-03-03 DIAGNOSIS — M79672 Pain in left foot: Principal | ICD-10-CM

## 2017-03-03 DIAGNOSIS — E0843 Diabetes mellitus due to underlying condition with diabetic autonomic (poly)neuropathy: Secondary | ICD-10-CM

## 2017-03-03 DIAGNOSIS — M79671 Pain in right foot: Secondary | ICD-10-CM

## 2017-03-03 DIAGNOSIS — M48061 Spinal stenosis, lumbar region without neurogenic claudication: Secondary | ICD-10-CM | POA: Insufficient documentation

## 2017-03-03 NOTE — Progress Notes (Signed)
   Subjective:    Patient ID: [name], female    DOB: 24-May-1940, 76 y.o.   MRN: [id number]  HPI    Review of Systems  All other systems reviewed and are negative.      Objective:   Physical Exam        Assessment & Plan:

## 2017-03-04 NOTE — Progress Notes (Signed)
   SUBJECTIVE Patient with a history of diabetes mellitus presents to office today for a routine diabetic foot exam. She states she has neuropathy and is currently taking Gabapentin for treatment. She denies any other complaints at this time. She is here for further evaluation and treatment.    Past Medical History:  Diagnosis Date  . Anemia    has sickle cell trait  . Anxiety   . Arthritis   . Asthma    has used inhaler in past for asthmatic bronchitis, last time- early 2012  . Complication of anesthesia    wakes up shaking  . Diabetes mellitus   . Dyslipidemia   . Fibromyalgia   . GERD (gastroesophageal reflux disease)    occas. use of  Prilosec  . Heart murmur    sees Dr. Montez Morita, last seen- early 2012  . Hypertension    02/2010- stress test /w PCP  . Hypothyroidism   . Neuromuscular disorder (HCC)    lumbar radiculopathy, lumbago  . Nocturnal leg cramps 09/27/2014  . Pneumonia   . Sickle cell trait ([place])   . Sleep apnea    borderline sleep apnea, states she no longer uses, early 2012- stopped using     OBJECTIVE General: The patient is alert and oriented x3 in no acute distress.  Dermatology: Skin is warm, dry and supple bilateral lower extremities. Negative for open lesions or macerations.  Vascular: Palpable pedal pulses bilaterally. No edema or erythema noted. Capillary refill within normal limits.  Neurological: Epicritic and protective threshold grossly intact bilaterally.   Musculoskeletal Exam: Range of motion within normal limits to all pedal and ankle joints bilateral. Muscle strength 5/5 in all groups bilateral.    ASSESSMENT 1. Diabetes Mellitus w/ peripheral neuropathy   PLAN OF CARE 1. Patient evaluated today. 2. Instructed to maintain good pedal hygiene and foot care. Stressed importance of controlling blood sugar.  3. Comprehensive diabetic foot exam performed.  4. Recommended good shoe gear. 5. Return to clinic when necessary.     Edrick Kins, DPM [facility]  Dr. Edrick Kins, [place]                                        [place], [place] [address]                Office [phone]  Fax [phone]

## 2017-03-11 NOTE — Pre-Procedure Instructions (Signed)
[name]  03/11/2017      [facility] [address] [place] [address] Phone: [phone] Fax: [phone]  [facility] [place] [address] [place], [place] [address] Phone: [phone] Fax: [phone]  [place], [place]. [place] [address] Phone: [phone] Fax: [place], [place] [address] Phone: [phone] Fax: [phone]    Your procedure is scheduled on November 26  Report to [facility] at 1100 A.M.  Call this number if you have problems the morning of surgery:  [phone]   Remember:  Do not eat food or drink liquids after midnight.  Continue all medications as directed by your physician except follow these medication instructions before surgery below   Take these medicines the morning of surgery with A SIP OF WATER  acetaminophen (TYLENOL)  cetirizine (ZYRTEC) fluticasone (FLONASE) Fluticasone Furoate (ARNUITY ELLIPTA) gabapentin (NEURONTIN) HYDROcodone-acetaminophen (NORCO/VICODIN)  levothyroxine (SYNTHROID, LEVOTHROID PROAIR RESPICLICK SYMBICORT   bring inhalers with you the morning of surgery   7 days prior to surgery STOP taking any Aspirin(unless otherwise instructed by your surgeon), Aleve, Naproxen, Ibuprofen, Motrin, Advil, Goody's, BC's, all herbal medications, fish oil, and all vitamins  Follow your doctors instructions regarding your Aspirin.  If no instructions were given by your doctor, then you will need to call the prescribing office office to get instructions.     WHAT DO I DO ABOUT MY DIABETES MEDICATION?   Mar[place] Kitchen Do not take oral diabetes  medicines (pills) the morning of surgery.       . THE MORNING OF SURGERY, take _____12________ units of __insulin degludec (TRESIBA FLEXTOUCH)________insulin.  . The day of surgery, do not take other diabetes injectables, including Byetta (exenatide), Bydureon (exenatide ER), Victoza (liraglutide), or Trulicity (dulaglutide).  . If your CBG is greater than 220 mg/dL, you may take  of your sliding scale (correction) dose of insulin. NOVOLOG FLEXPEN    How to Manage Your Diabetes Before and After Surgery  Why is it important to control my blood sugar before and after surgery? . Improving blood sugar levels before and after surgery helps healing and can limit problems. . A way of improving blood sugar control is eating a healthy diet by: o  Eating less sugar and carbohydrates o  Increasing activity/exercise o  Talking with your doctor about reaching your blood sugar goals . High blood sugars (greater than 180 mg/dL) can raise your risk of infections and slow your recovery, so you will need to focus on controlling your diabetes during the weeks before surgery. . Make sure that the doctor who takes care of your diabetes knows about your planned surgery including the date and location.  How do I manage my blood sugar before surgery? . Check your blood sugar at least 4 times a day, starting 2 days before surgery, to make sure that the level is not too high or low. o Check your blood sugar the morning of your surgery when you wake up and every 2 hours until you get to the Short Stay unit. . If your blood sugar is less than 70 mg/dL, you  will need to treat for low blood sugar: o Do not take insulin. o Treat a low blood sugar (less than 70 mg/dL) with  cup of clear juice (cranberry or apple), 4 glucose tablets, OR glucose gel. o Recheck blood sugar in 15 minutes after treatment (to make sure it is greater than 70 mg/dL). If your blood sugar is not greater than 70 mg/dL on recheck, call  [phone] for further instructions. . Report your blood sugar to the short stay nurse when you get to Short Stay.  . If you are admitted to the hospital after surgery: o Your blood sugar will be checked by the staff and you will probably be given insulin after surgery (instead of oral diabetes medicines) to make sure you have good blood sugar levels. o The goal for blood sugar control after surgery is 80-180 mg/dL.     Do not wear jewelry, make-up or nail polish.  Do not wear lotions, powders, or perfumes, or deoderant.  Do not shave 48 hours prior to surgery.  Men may shave face and neck.  Do not bring valuables to the hospital.  [facility] is not responsible for any belongings or valuables.  Contacts, dentures or bridgework may not be worn into surgery.  Leave your suitcase in the car.  After surgery it may be brought to your room.  For patients admitted to the hospital, discharge time will be determined by your treatment team.  Patients discharged the day of surgery will not be allowed to drive home.    Special instructions:   [facility]- Preparing For Surgery  Before surgery, you can play an important role. Because skin is not sterile, your skin needs to be as free of germs as possible. You can reduce the number of germs on your skin by washing with CHG (chlorahexidine gluconate) Soap before surgery.  CHG is an antiseptic cleaner which kills germs and bonds with the skin to continue killing germs even after washing.  Please do not use if you have an allergy to CHG or antibacterial soaps. If your skin becomes reddened/irritated stop using the CHG.  Do not shave (including legs and underarms) for at least 48 hours prior to first CHG shower. It is OK to shave your face.  Please follow these instructions carefully.   1. Shower the NIGHT BEFORE SURGERY and the MORNING OF SURGERY with CHG.   2. If you chose to wash your hair, wash your hair first as usual with your normal  shampoo.  3. After you shampoo, rinse your hair and body thoroughly to remove the shampoo.  4. Use CHG as you would any other liquid soap. You can apply CHG directly to the skin and wash gently with a scrungie or a clean washcloth.   5. Apply the CHG Soap to your body ONLY FROM THE NECK DOWN.  Do not use on open wounds or open sores. Avoid contact with your eyes, ears, mouth and genitals (private parts). Wash Face and genitals (private parts)  with your normal soap.  6. Wash thoroughly, paying special attention to the area where your surgery will be performed.  7. Thoroughly rinse your body with warm water from the neck down.  8. DO NOT shower/wash with your normal soap after using and rinsing off the CHG Soap.  9. Pat yourself dry with a CLEAN TOWEL.  10. Wear CLEAN PAJAMAS to bed the night before surgery, wear comfortable clothes the morning of surgery  11. Place CLEAN SHEETS on your  bed the night of your first shower and DO NOT SLEEP WITH PETS.    Day of Surgery: Do not apply any deodorants/lotions. Please wear clean clothes to the hospital/surgery center.      Please read over the following fact sheets that you were given.

## 2017-03-12 ENCOUNTER — Encounter (HOSPITAL_COMMUNITY): Payer: Self-pay

## 2017-03-12 ENCOUNTER — Encounter (HOSPITAL_COMMUNITY)
Admission: RE | Admit: 2017-03-12 | Discharge: 2017-03-12 | Disposition: A | Discharge status: Home / Self Care | Payer: Medicare Other | Source: Ambulatory Visit | Attending: Neurological Surgery | Admitting: Neurological Surgery

## 2017-03-12 ENCOUNTER — Other Ambulatory Visit: Payer: Self-pay

## 2017-03-12 DIAGNOSIS — Z87891 Personal history of nicotine dependence: Secondary | ICD-10-CM | POA: Insufficient documentation

## 2017-03-12 DIAGNOSIS — M797 Fibromyalgia: Secondary | ICD-10-CM | POA: Diagnosis not present

## 2017-03-12 DIAGNOSIS — Z794 Long term (current) use of insulin: Secondary | ICD-10-CM | POA: Diagnosis not present

## 2017-03-12 DIAGNOSIS — J449 Chronic obstructive pulmonary disease, unspecified: Secondary | ICD-10-CM | POA: Insufficient documentation

## 2017-03-12 DIAGNOSIS — M48062 Spinal stenosis, lumbar region with neurogenic claudication: Secondary | ICD-10-CM | POA: Diagnosis not present

## 2017-03-12 DIAGNOSIS — Z79891 Long term (current) use of opiate analgesic: Secondary | ICD-10-CM | POA: Diagnosis not present

## 2017-03-12 DIAGNOSIS — Z01812 Encounter for preprocedural laboratory examination: Secondary | ICD-10-CM | POA: Insufficient documentation

## 2017-03-12 DIAGNOSIS — E119 Type 2 diabetes mellitus without complications: Secondary | ICD-10-CM | POA: Diagnosis not present

## 2017-03-12 DIAGNOSIS — K219 Gastro-esophageal reflux disease without esophagitis: Secondary | ICD-10-CM | POA: Diagnosis not present

## 2017-03-12 DIAGNOSIS — Z79899 Other long term (current) drug therapy: Secondary | ICD-10-CM | POA: Insufficient documentation

## 2017-03-12 DIAGNOSIS — I10 Essential (primary) hypertension: Secondary | ICD-10-CM | POA: Diagnosis not present

## 2017-03-12 HISTORY — DX: Other fecal abnormalities: R19.5

## 2017-03-12 HISTORY — DX: Diverticulitis of intestine, part unspecified, without perforation or abscess without bleeding: K57.92

## 2017-03-12 HISTORY — DX: Encephalopathy, unspecified: G93.40

## 2017-03-12 LAB — CBC
HCT: 39.2 % (ref 36.0–46.0)
HEMOGLOBIN: 12.8 g/dL (ref 12.0–15.0)
MCH: 27.4 pg (ref 26.0–34.0)
MCHC: 32.7 g/dL (ref 30.0–36.0)
MCV: 83.8 fL (ref 78.0–100.0)
PLATELETS: 227 10*3/uL (ref 150–400)
RBC: 4.68 MIL/uL (ref 3.87–5.11)
RDW: 14.8 % (ref 11.5–15.5)
WBC: 6.7 10*3/uL (ref 4.0–10.5)

## 2017-03-12 LAB — TYPE AND SCREEN
ABO/RH(D): B POS
ANTIBODY SCREEN: NEGATIVE

## 2017-03-12 LAB — BASIC METABOLIC PANEL
ANION GAP: 8 (ref 5–15)
BUN: 15 mg/dL (ref 6–20)
CALCIUM: 9.6 mg/dL (ref 8.9–10.3)
CO2: 27 mmol/L (ref 22–32)
Chloride: 105 mmol/L (ref 101–111)
Creatinine, Ser: 0.92 mg/dL (ref 0.44–1.00)
GFR, EST NON AFRICAN AMERICAN: 59 mL/min — AB (ref >60)
GLUCOSE: 82 mg/dL (ref 65–99)
Potassium: 4 mmol/L (ref 3.5–5.1)
Sodium: 140 mmol/L (ref 135–145)

## 2017-03-12 LAB — GLUCOSE, CAPILLARY: GLUCOSE-CAPILLARY: 83 mg/dL (ref 65–99)

## 2017-03-12 LAB — HEMOGLOBIN A1C
HEMOGLOBIN A1C: 6.5 % — AB (ref 4.8–5.6)
MEAN PLASMA GLUCOSE: 139.85 mg/dL

## 2017-03-12 LAB — SURGICAL PCR SCREEN
MRSA, PCR: NEGATIVE
Staphylococcus aureus: NEGATIVE

## 2017-03-12 NOTE — Progress Notes (Addendum)
PCP: Dr. Benita Stabile @ [facility] Endocrinologist: Dr. Darrow Bussing Gastroenterologist: Dr. Collene Mares  Fasting sugars 100-110 Stated she was told to stop aspirin  Nov. 19, pt. Couldn't remember who told her to stop it.

## 2017-03-17 ENCOUNTER — Inpatient Hospital Stay (HOSPITAL_COMMUNITY)
Admission: RE | Admit: 2017-03-17 | Discharge: 2017-03-21 | DRG: 460 | Disposition: A | Discharge status: Skilled Nursing Facility | Payer: Medicare Other | Source: Ambulatory Visit | Attending: Neurological Surgery | Admitting: Neurological Surgery

## 2017-03-17 ENCOUNTER — Inpatient Hospital Stay (HOSPITAL_COMMUNITY): Payer: Medicare Other

## 2017-03-17 ENCOUNTER — Other Ambulatory Visit: Payer: Medicare Other

## 2017-03-17 ENCOUNTER — Telehealth: Payer: Self-pay | Admitting: Endocrinology

## 2017-03-17 ENCOUNTER — Encounter (HOSPITAL_COMMUNITY)
Admission: RE | Disposition: A | Discharge status: Skilled Nursing Facility | Payer: Self-pay | Source: Ambulatory Visit | Attending: Neurological Surgery

## 2017-03-17 ENCOUNTER — Inpatient Hospital Stay (HOSPITAL_COMMUNITY): Payer: Medicare Other | Admitting: Emergency Medicine

## 2017-03-17 ENCOUNTER — Encounter (HOSPITAL_COMMUNITY): Payer: Self-pay | Admitting: *Deleted

## 2017-03-17 ENCOUNTER — Inpatient Hospital Stay (HOSPITAL_COMMUNITY): Payer: Medicare Other | Admitting: Certified Registered Nurse Anesthetist

## 2017-03-17 DIAGNOSIS — D62 Acute posthemorrhagic anemia: Secondary | ICD-10-CM | POA: Diagnosis not present

## 2017-03-17 DIAGNOSIS — R262 Difficulty in walking, not elsewhere classified: Secondary | ICD-10-CM | POA: Diagnosis present

## 2017-03-17 DIAGNOSIS — Z419 Encounter for procedure for purposes other than remedying health state, unspecified: Secondary | ICD-10-CM

## 2017-03-17 DIAGNOSIS — M4316 Spondylolisthesis, lumbar region: Secondary | ICD-10-CM | POA: Diagnosis not present

## 2017-03-17 DIAGNOSIS — R2689 Other abnormalities of gait and mobility: Secondary | ICD-10-CM | POA: Diagnosis not present

## 2017-03-17 DIAGNOSIS — M25551 Pain in right hip: Secondary | ICD-10-CM | POA: Diagnosis present

## 2017-03-17 DIAGNOSIS — M48061 Spinal stenosis, lumbar region without neurogenic claudication: Secondary | ICD-10-CM | POA: Diagnosis not present

## 2017-03-17 DIAGNOSIS — E119 Type 2 diabetes mellitus without complications: Secondary | ICD-10-CM | POA: Diagnosis not present

## 2017-03-17 DIAGNOSIS — M479 Spondylosis, unspecified: Secondary | ICD-10-CM | POA: Diagnosis not present

## 2017-03-17 DIAGNOSIS — M47816 Spondylosis without myelopathy or radiculopathy, lumbar region: Secondary | ICD-10-CM | POA: Diagnosis not present

## 2017-03-17 DIAGNOSIS — M48062 Spinal stenosis, lumbar region with neurogenic claudication: Secondary | ICD-10-CM

## 2017-03-17 DIAGNOSIS — M5116 Intervertebral disc disorders with radiculopathy, lumbar region: Secondary | ICD-10-CM | POA: Diagnosis not present

## 2017-03-17 DIAGNOSIS — Z981 Arthrodesis status: Secondary | ICD-10-CM | POA: Diagnosis not present

## 2017-03-17 DIAGNOSIS — M5136 Other intervertebral disc degeneration, lumbar region: Secondary | ICD-10-CM | POA: Diagnosis present

## 2017-03-17 DIAGNOSIS — K5900 Constipation, unspecified: Secondary | ICD-10-CM | POA: Diagnosis not present

## 2017-03-17 DIAGNOSIS — M4726 Other spondylosis with radiculopathy, lumbar region: Secondary | ICD-10-CM | POA: Diagnosis not present

## 2017-03-17 DIAGNOSIS — M4326 Fusion of spine, lumbar region: Secondary | ICD-10-CM | POA: Diagnosis not present

## 2017-03-17 DIAGNOSIS — M797 Fibromyalgia: Secondary | ICD-10-CM | POA: Diagnosis present

## 2017-03-17 DIAGNOSIS — M549 Dorsalgia, unspecified: Secondary | ICD-10-CM | POA: Diagnosis not present

## 2017-03-17 DIAGNOSIS — Z4789 Encounter for other orthopedic aftercare: Secondary | ICD-10-CM | POA: Diagnosis not present

## 2017-03-17 DIAGNOSIS — G8929 Other chronic pain: Secondary | ICD-10-CM | POA: Diagnosis not present

## 2017-03-17 DIAGNOSIS — R41841 Cognitive communication deficit: Secondary | ICD-10-CM | POA: Diagnosis not present

## 2017-03-17 DIAGNOSIS — M6281 Muscle weakness (generalized): Secondary | ICD-10-CM | POA: Diagnosis not present

## 2017-03-17 HISTORY — DX: Spinal stenosis, lumbar region with neurogenic claudication: M48.062

## 2017-03-17 HISTORY — PX: POSTERIOR LUMBAR FUSION 4 LEVEL: SHX6037

## 2017-03-17 LAB — GLUCOSE, CAPILLARY
Glucose-Capillary: 126 mg/dL — ABNORMAL HIGH (ref 65–99)
Glucose-Capillary: 152 mg/dL — ABNORMAL HIGH (ref 65–99)

## 2017-03-17 SURGERY — POSTERIOR LUMBAR FUSION 4 LEVEL
Anesthesia: General | Site: Spine Lumbar

## 2017-03-17 MED ORDER — THROMBIN (RECOMBINANT) 20000 UNITS EX SOLR
CUTANEOUS | Status: AC
  Filled 2017-03-17: qty 20000

## 2017-03-17 MED ORDER — 0.9 % SODIUM CHLORIDE (POUR BTL) OPTIME
TOPICAL | Status: DC | PRN
  Administered 2017-03-17: 1000 mL

## 2017-03-17 MED ORDER — ROCURONIUM BROMIDE 10 MG/ML (PF) SYRINGE
PREFILLED_SYRINGE | INTRAVENOUS | Status: AC
  Filled 2017-03-17: qty 15

## 2017-03-17 MED ORDER — PHENYLEPHRINE 40 MCG/ML (10ML) SYRINGE FOR IV PUSH (FOR BLOOD PRESSURE SUPPORT)
PREFILLED_SYRINGE | INTRAVENOUS | Status: AC
  Filled 2017-03-17: qty 20

## 2017-03-17 MED ORDER — MEPERIDINE HCL 25 MG/ML IJ SOLN: 6.2500 mg | INTRAMUSCULAR | Status: DC | PRN

## 2017-03-17 MED ORDER — CHLORHEXIDINE GLUCONATE CLOTH 2 % EX PADS: 6.0000 | MEDICATED_PAD | Freq: Once | CUTANEOUS | Status: DC

## 2017-03-17 MED ORDER — BUPIVACAINE HCL (PF) 0.5 % IJ SOLN
INTRAMUSCULAR | Status: DC | PRN
  Administered 2017-03-17: 5 mL
  Administered 2017-03-17: 25 mL

## 2017-03-17 MED ORDER — GELATIN ABSORBABLE MT POWD
OROMUCOSAL | Status: DC | PRN
  Administered 2017-03-17: 16:00:00 via TOPICAL

## 2017-03-17 MED ORDER — PROPOFOL 10 MG/ML IV BOLUS
INTRAVENOUS | Status: AC
  Filled 2017-03-17: qty 20

## 2017-03-17 MED ORDER — ALBUMIN HUMAN 5 % IV SOLN
INTRAVENOUS | Status: DC | PRN
  Administered 2017-03-17: 16:00:00 via INTRAVENOUS

## 2017-03-17 MED ORDER — HYDROMORPHONE HCL 1 MG/ML IJ SOLN
0.2500 mg | INTRAMUSCULAR | Status: DC | PRN
  Administered 2017-03-17 (×2): 0.5 mg via INTRAVENOUS
  Administered 2017-03-17: 0.25 mg via INTRAVENOUS

## 2017-03-17 MED ORDER — LIDOCAINE-EPINEPHRINE 1 %-1:100000 IJ SOLN
INTRAMUSCULAR | Status: AC
  Filled 2017-03-17: qty 1

## 2017-03-17 MED ORDER — FENTANYL CITRATE (PF) 250 MCG/5ML IJ SOLN
INTRAMUSCULAR | Status: AC
  Filled 2017-03-17: qty 5

## 2017-03-17 MED ORDER — DEXAMETHASONE SODIUM PHOSPHATE 10 MG/ML IJ SOLN
INTRAMUSCULAR | Status: DC | PRN
  Administered 2017-03-17: 10 mg via INTRAVENOUS

## 2017-03-17 MED ORDER — ONDANSETRON HCL 4 MG/2ML IJ SOLN
INTRAMUSCULAR | Status: AC
  Filled 2017-03-17: qty 6

## 2017-03-17 MED ORDER — THROMBIN (RECOMBINANT) 5000 UNITS EX SOLR
CUTANEOUS | Status: AC
  Filled 2017-03-17: qty 5000

## 2017-03-17 MED ORDER — SODIUM CHLORIDE 0.9 % IV SOLN
INTRAVENOUS | Status: DC | PRN
  Administered 2017-03-17: 18:00:00 via INTRAVENOUS

## 2017-03-17 MED ORDER — SODIUM CHLORIDE 0.9 % IR SOLN
Status: DC | PRN
  Administered 2017-03-17: 15:00:00

## 2017-03-17 MED ORDER — SUCCINYLCHOLINE CHLORIDE 200 MG/10ML IV SOSY
PREFILLED_SYRINGE | INTRAVENOUS | Status: AC
  Filled 2017-03-17: qty 10

## 2017-03-17 MED ORDER — ARTIFICIAL TEARS OPHTHALMIC OINT
TOPICAL_OINTMENT | OPHTHALMIC | Status: AC
  Filled 2017-03-17: qty 7

## 2017-03-17 MED ORDER — LACTATED RINGERS IV SOLN
Freq: Once | INTRAVENOUS | Status: AC
  Administered 2017-03-17: 13:00:00 via INTRAVENOUS

## 2017-03-17 MED ORDER — SUGAMMADEX SODIUM 200 MG/2ML IV SOLN
INTRAVENOUS | Status: DC | PRN
  Administered 2017-03-17: 200 mg via INTRAVENOUS

## 2017-03-17 MED ORDER — FENTANYL CITRATE (PF) 250 MCG/5ML IJ SOLN
INTRAMUSCULAR | Status: DC | PRN
  Administered 2017-03-17: 50 ug via INTRAVENOUS
  Administered 2017-03-17: 250 ug via INTRAVENOUS
  Administered 2017-03-17: 50 ug via INTRAVENOUS

## 2017-03-17 MED ORDER — PROMETHAZINE HCL 25 MG/ML IJ SOLN: 6.2500 mg | INTRAMUSCULAR | Status: DC | PRN

## 2017-03-17 MED ORDER — ROCURONIUM BROMIDE 10 MG/ML (PF) SYRINGE
PREFILLED_SYRINGE | INTRAVENOUS | Status: DC | PRN
  Administered 2017-03-17: 20 mg via INTRAVENOUS
  Administered 2017-03-17: 50 mg via INTRAVENOUS
  Administered 2017-03-17: 10 mg via INTRAVENOUS
  Administered 2017-03-17: 20 mg via INTRAVENOUS

## 2017-03-17 MED ORDER — DEXAMETHASONE SODIUM PHOSPHATE 10 MG/ML IJ SOLN
INTRAMUSCULAR | Status: AC
  Filled 2017-03-17: qty 2

## 2017-03-17 MED ORDER — DIPHENHYDRAMINE HCL 50 MG/ML IJ SOLN
INTRAMUSCULAR | Status: DC | PRN
  Administered 2017-03-17: 25 mg via INTRAVENOUS

## 2017-03-17 MED ORDER — MIDAZOLAM HCL 2 MG/2ML IJ SOLN
INTRAMUSCULAR | Status: DC | PRN
  Administered 2017-03-17: 2 mg via INTRAVENOUS

## 2017-03-17 MED ORDER — HYDROMORPHONE HCL 1 MG/ML IJ SOLN
INTRAMUSCULAR | Status: AC
  Filled 2017-03-17: qty 1

## 2017-03-17 MED ORDER — MIDAZOLAM HCL 2 MG/2ML IJ SOLN
INTRAMUSCULAR | Status: AC
  Filled 2017-03-17: qty 2

## 2017-03-17 MED ORDER — BUPIVACAINE HCL (PF) 0.5 % IJ SOLN
INTRAMUSCULAR | Status: AC
  Filled 2017-03-17: qty 30

## 2017-03-17 MED ORDER — DIPHENHYDRAMINE HCL 50 MG/ML IJ SOLN
INTRAMUSCULAR | Status: AC
  Filled 2017-03-17: qty 1

## 2017-03-17 MED ORDER — SUGAMMADEX SODIUM 200 MG/2ML IV SOLN
INTRAVENOUS | Status: AC
  Filled 2017-03-17: qty 2

## 2017-03-17 MED ORDER — LIDOCAINE 2% (20 MG/ML) 5 ML SYRINGE
INTRAMUSCULAR | Status: DC | PRN
  Administered 2017-03-17: 20 mg via INTRAVENOUS

## 2017-03-17 MED ORDER — LIDOCAINE-EPINEPHRINE 1 %-1:100000 IJ SOLN
INTRAMUSCULAR | Status: DC | PRN
  Administered 2017-03-17: 5 mL

## 2017-03-17 MED ORDER — CEFAZOLIN SODIUM-DEXTROSE 2-4 GM/100ML-% IV SOLN
2.0000 g | INTRAVENOUS | Status: AC
  Administered 2017-03-17: 2 g via INTRAVENOUS
  Filled 2017-03-17: qty 100

## 2017-03-17 MED ORDER — ONDANSETRON HCL 4 MG/2ML IJ SOLN
INTRAMUSCULAR | Status: DC | PRN
  Administered 2017-03-17: 4 mg via INTRAVENOUS

## 2017-03-17 MED ORDER — MIDAZOLAM HCL 2 MG/2ML IJ SOLN: 0.5000 mg | Freq: Once | INTRAMUSCULAR | Status: DC | PRN

## 2017-03-17 MED ORDER — HYDROMORPHONE HCL 1 MG/ML IJ SOLN
INTRAMUSCULAR | Status: AC
  Administered 2017-03-17: 0.5 mg via INTRAVENOUS
  Filled 2017-03-17: qty 1

## 2017-03-17 MED ORDER — PROPOFOL 10 MG/ML IV BOLUS
INTRAVENOUS | Status: DC | PRN
  Administered 2017-03-17: 120 mg via INTRAVENOUS

## 2017-03-17 MED ORDER — LACTATED RINGERS IV SOLN
INTRAVENOUS | Status: DC | PRN
  Administered 2017-03-17 (×2): via INTRAVENOUS

## 2017-03-17 MED ORDER — DEXTROSE 5 % IV SOLN
INTRAVENOUS | Status: DC | PRN
  Administered 2017-03-17: 20 ug/min via INTRAVENOUS

## 2017-03-17 MED ORDER — SURGIFOAM 100 EX MISC
CUTANEOUS | Status: DC | PRN
  Administered 2017-03-17: 16:00:00 via TOPICAL

## 2017-03-17 MED ORDER — LIDOCAINE 2% (20 MG/ML) 5 ML SYRINGE
INTRAMUSCULAR | Status: AC
  Filled 2017-03-17: qty 10

## 2017-03-17 SURGICAL SUPPLY — 77 items
ADH SKN CLS APL DERMABOND .7 (GAUZE/BANDAGES/DRESSINGS) ×1
APL SRG 60D 8 XTD TIP BNDBL (TIP)
BAG DECANTER FOR FLEXI CONT (MISCELLANEOUS) ×2 IMPLANT
BASKET BONE COLLECTION (BASKET) ×2 IMPLANT
BONE CANC CHIPS 40CC CAN1/2 (Bone Implant) ×2 IMPLANT
BUR MATCHSTICK NEURO 3.0 LAGG (BURR) ×2 IMPLANT
CAGE COROENT MP 11X23X9 (Cage) ×1 IMPLANT
CANISTER SUCT 3000ML PPV (MISCELLANEOUS) ×2 IMPLANT
CARTRIDGE OIL MAESTRO DRILL (MISCELLANEOUS) ×1 IMPLANT
CHIPS CANC BONE 40CC CAN1/2 (Bone Implant) ×1 IMPLANT
CONT SPEC 4OZ CLIKSEAL STRL BL (MISCELLANEOUS) ×2 IMPLANT
COVER BACK TABLE 60X90IN (DRAPES) ×2 IMPLANT
DECANTER SPIKE VIAL GLASS SM (MISCELLANEOUS) ×2 IMPLANT
DERMABOND ADVANCED (GAUZE/BANDAGES/DRESSINGS) ×1
DERMABOND ADVANCED .7 DNX12 (GAUZE/BANDAGES/DRESSINGS) ×1 IMPLANT
DEVICE DISSECT PLASMABLAD 3.0S (MISCELLANEOUS) ×1 IMPLANT
DIFFUSER DRILL AIR PNEUMATIC (MISCELLANEOUS) ×2 IMPLANT
DIGITIZER BENDINI (MISCELLANEOUS) ×1 IMPLANT
DRAPE C-ARM 42X72 X-RAY (DRAPES) ×4 IMPLANT
DRAPE HALF SHEET 40X57 (DRAPES) ×1 IMPLANT
DRAPE LAPAROTOMY 100X72X124 (DRAPES) ×2 IMPLANT
DRAPE POUCH INSTRU U-SHP 10X18 (DRAPES) ×2 IMPLANT
DRSG OPSITE POSTOP 4X10 (GAUZE/BANDAGES/DRESSINGS) ×1 IMPLANT
DURAPREP 26ML APPLICATOR (WOUND CARE) ×2 IMPLANT
DURASEAL APPLICATOR TIP (TIP) IMPLANT
DURASEAL SPINE SEALANT 3ML (MISCELLANEOUS) IMPLANT
ELECT REM PT RETURN 9FT ADLT (ELECTROSURGICAL) ×2
ELECTRODE REM PT RTRN 9FT ADLT (ELECTROSURGICAL) ×1 IMPLANT
GAUZE SPONGE 4X4 12PLY STRL (GAUZE/BANDAGES/DRESSINGS) ×2 IMPLANT
GAUZE SPONGE 4X4 16PLY XRAY LF (GAUZE/BANDAGES/DRESSINGS) IMPLANT
GLOVE BIOGEL PI IND STRL 7.0 (GLOVE) IMPLANT
GLOVE BIOGEL PI IND STRL 7.5 (GLOVE) IMPLANT
GLOVE BIOGEL PI IND STRL 8 (GLOVE) IMPLANT
GLOVE BIOGEL PI IND STRL 8.5 (GLOVE) ×2 IMPLANT
GLOVE BIOGEL PI INDICATOR 7.0 (GLOVE) ×1
GLOVE BIOGEL PI INDICATOR 7.5 (GLOVE) ×3
GLOVE BIOGEL PI INDICATOR 8 (GLOVE) ×1
GLOVE BIOGEL PI INDICATOR 8.5 (GLOVE) ×2
GLOVE ECLIPSE 8.5 STRL (GLOVE) ×4 IMPLANT
GLOVE SS N UNI LF 6.5 STRL (GLOVE) ×3 IMPLANT
GLOVE SS N UNI LF 7.0 STRL (GLOVE) ×3 IMPLANT
GOWN STRL REUS W/ TWL LRG LVL3 (GOWN DISPOSABLE) IMPLANT
GOWN STRL REUS W/ TWL XL LVL3 (GOWN DISPOSABLE) IMPLANT
GOWN STRL REUS W/TWL 2XL LVL3 (GOWN DISPOSABLE) ×4 IMPLANT
GOWN STRL REUS W/TWL LRG LVL3 (GOWN DISPOSABLE)
GOWN STRL REUS W/TWL XL LVL3 (GOWN DISPOSABLE)
GRAFT BNE CHIP CANC 1-8 40 (Bone Implant) IMPLANT
HEMOSTAT POWDER KIT SURGIFOAM (HEMOSTASIS) IMPLANT
KIT BASIN OR (CUSTOM PROCEDURE TRAY) ×2 IMPLANT
KIT INFUSE MEDIUM (Orthopedic Implant) ×1 IMPLANT
KIT ROOM TURNOVER OR (KITS) ×2 IMPLANT
MILL MEDIUM DISP (BLADE) ×2 IMPLANT
MODULE POWER NUVASIVE (MISCELLANEOUS) IMPLANT
NEEDLE HYPO 22GX1.5 SAFETY (NEEDLE) ×2 IMPLANT
NS IRRIG 1000ML POUR BTL (IV SOLUTION) ×2 IMPLANT
OIL CARTRIDGE MAESTRO DRILL (MISCELLANEOUS) ×2
PACK LAMINECTOMY NEURO (CUSTOM PROCEDURE TRAY) ×2 IMPLANT
PAD ARMBOARD 7.5X6 YLW CONV (MISCELLANEOUS) ×6 IMPLANT
PATTIES SURGICAL .5 X1 (DISPOSABLE) ×2 IMPLANT
PLASMABLADE 3.0S (MISCELLANEOUS) ×2
POWER MODULE NUVASIVE (MISCELLANEOUS) ×2
ROD RELINE-O 5.5X300 STRT NS (Rod) IMPLANT
ROD RELINE-O 5.5X300MM STRT (Rod) ×4 IMPLANT
SCREW LOCK RELINE 5.5 TULIP (Screw) ×10 IMPLANT
SCREW RELINE-O POLY 6.5X45 (Screw) ×8 IMPLANT
SPONGE LAP 4X18 X RAY DECT (DISPOSABLE) IMPLANT
SPONGE SURGIFOAM ABS GEL 100 (HEMOSTASIS) ×2 IMPLANT
SUT PROLENE 6 0 BV (SUTURE) IMPLANT
SUT VIC AB 1 CT1 18XBRD ANBCTR (SUTURE) ×1 IMPLANT
SUT VIC AB 1 CT1 8-18 (SUTURE) ×2
SUT VIC AB 2-0 CP2 18 (SUTURE) ×2 IMPLANT
SUT VIC AB 3-0 SH 8-18 (SUTURE) ×2 IMPLANT
SYR 3ML LL SCALE MARK (SYRINGE) ×8 IMPLANT
TOWEL GREEN STERILE (TOWEL DISPOSABLE) ×2 IMPLANT
TOWEL GREEN STERILE FF (TOWEL DISPOSABLE) ×2 IMPLANT
TRAY FOLEY W/METER SILVER 16FR (SET/KITS/TRAYS/PACK) ×2 IMPLANT
WATER STERILE IRR 1000ML POUR (IV SOLUTION) ×2 IMPLANT

## 2017-03-17 NOTE — Telephone Encounter (Signed)
t called to inform [name] she is having her 5th back surgery today 03/17/17.

## 2017-03-17 NOTE — Anesthesia Preprocedure Evaluation (Addendum)
Anesthesia Evaluation  Patient identified by MRN, date of birth, ID band Patient awake    Reviewed: Allergy & Precautions, NPO status , Patient's Chart, lab work & pertinent test results  History of Anesthesia Complications Negative for: history of anesthetic complications  Airway Mallampati: II  TM Distance: >3 FB Neck ROM: Full    Dental  (+) Dental Advisory Given   Pulmonary sleep apnea (no longer uses CPAP) , COPD,  COPD inhaler, former smoker (quit 2000),    breath sounds clear to auscultation       Cardiovascular hypertension, Pt. on medications (-) angina Rhythm:Regular Rate:Normal  '16 ECHO: EF 60-65%, valves OK   Neuro/Psych Chronic back pain: narcotics    GI/Hepatic Neg liver ROS, GERD  Controlled,  Endo/Other  diabetes (glu 126), Insulin DependentHypothyroidism   Renal/GU negative Renal ROS     Musculoskeletal  (+) Fibromyalgia -, narcotic dependent  Abdominal   Peds  Hematology  (+) Sickle cell trait ,   Anesthesia Other Findings   Reproductive/Obstetrics                            Anesthesia Physical Anesthesia Plan  ASA: III  Anesthesia Plan: General   Post-op Pain Management:    Induction: Intravenous  PONV Risk Score and Plan: 4 or greater and Ondansetron, Dexamethasone, Midazolam and Diphenhydramine  Airway Management Planned: Oral ETT  Additional Equipment:   Intra-op Plan:   Post-operative Plan: Extubation in OR  Informed Consent: I have reviewed the patients History and Physical, chart, labs and discussed the procedure including the risks, benefits and alternatives for the proposed anesthesia with the patient or authorized representative who has indicated his/her understanding and acceptance.   Dental advisory given  Plan Discussed with: CRNA and Surgeon  Anesthesia Plan Comments: (Plan routine monitors, GETA)        Anesthesia Quick  Evaluation

## 2017-03-17 NOTE — Anesthesia Procedure Notes (Signed)
Procedure Name: Intubation Date/Time: 03/17/2017 1:43 PM Performed by: Annye Asa, MD Pre-anesthesia Checklist: Patient identified, Emergency Drugs available, Suction available and Patient being monitored Patient Re-evaluated:Patient Re-evaluated prior to induction Oxygen Delivery Method: Circle System Utilized Preoxygenation: Pre-oxygenation with 100% oxygen Induction Type: IV induction Ventilation: Mask ventilation without difficulty Laryngoscope Size: Mac and 3 Grade View: Grade III Tube type: Oral Tube size: 7.0 mm Number of attempts: 1 Airway Equipment and Method: Stylet and Oral airway Placement Confirmation: ETT inserted through vocal cords under direct vision,  positive ETCO2 and breath sounds checked- equal and bilateral Secured at: 21 cm Tube secured with: Tape Dental Injury: Teeth and Oropharynx as per pre-operative assessment  Comments: Intubation per Vickii Penna, SRNA.

## 2017-03-17 NOTE — H&P (Signed)
Chief complaint: back and bilateral leg pain with weakness History of present illness: [name] is a 75 year old individual who's had significant back and bilateral leg pain. She has evidence of severe spondylosis and(and has had difficulties initially at L5-S1 with a decompression and effusion there then at L4-5 and L3-4 and subsequently at L2-L3 she had surgery just in March of this past year to decompress and stabilize that area. Since that time however she is developed significant degeneration of the disc at L1-2 and now has retrolisthesis at that level. Because of the proximity to the thoracolumbar junction was advised that surgery would be extended to the level of T10 to stabilize across the thoracolumbar junction. She is now being admitted to undergo this surgical procedure.   Past medical history reveals that her general health has been hampered by type 2 diabetes mellitus fibromyalgia in addition to the issues with her back. Her current medications include Fosamax calcium supplementation cetirizine fluticasone gabapentin glimepiride Humalog hydrocodone valsartan pravastatin in addition to other supplements. Social history: Patient is single she lives independently. Systems review: Is significant for difficulties with diabetic control past chronic pain difficulty ambulating fibromyalgia.  Physical exam: The patient is alert and oriented. She demonstrates weakness in the proximal lower extremities in iliopsoas and quadriceps she has absent patellar and Achilles reflexes. Is also suggestion of some tibialis anterior weakness to 4 out of 5 bilaterally. Deep tendon reflexes in the Achilles are absent. Straight leg raising is positive at 45 in either lower extremities. Patrick's maneuver is negative. Upper extremity strength and reflexes are intact cranial nerve examination is intact. Lungs are clear to auscultation Heart has a regular rate and rhythm Abdomen is soft Bowel sounds positive Extremities  feel no cyanosis clubbing or edema Impression: The patient has evidence of severe degenerative changes at the L1-L2 level. She is a previous arthrodesis L2 to sacrum. She is now to undergo decompression and fixation from T12 to the L2 vertebrae with posterior lumbar interbody arthrodesis at the L1-L2 level.

## 2017-03-17 NOTE — Transfer of Care (Signed)
Immediate Anesthesia Transfer of Care Note  Patient: [name]  Procedure(s) Performed: Decompression of Lumbar One-Two with Thoracic Ten to Lumbar Two Fusion (N/A Spine Lumbar)  Patient Location: PACU  Anesthesia Type:General  Level of Consciousness: awake, alert  and oriented  Airway & Oxygen Therapy: Patient Spontanous Breathing and Patient connected to nasal cannula oxygen  Post-op Assessment: Report given to RN and Post -op Vital signs reviewed and stable  Post vital signs: Reviewed and stable  Last Vitals:  Vitals:   03/17/17 1121  BP: (!) 156/41  Pulse: 73  Resp: 18  Temp: 37 C  SpO2: 99%    Last Pain:  Vitals:   03/17/17 1215  TempSrc:   PainSc: 7          Complications: No apparent anesthesia complications

## 2017-03-17 NOTE — Telephone Encounter (Signed)
FYI

## 2017-03-17 NOTE — Op Note (Signed)
Date of surgery: 03/17/2017 Preoperative diagnosis: Spondylosis and stenosis L1-L2, status post decompression and fusion L2 to the sacrum, neurogenic claudication, lumbar radiculopathy. Postoperative diagnosis: Same Procedure: Decompression of L1-L2 posterior lumbar interbody arthrodesis using peek spacer local autograft allograft and infuse. Segmental fixation from T10-L2 with pedicle screws and posterior and posterior lateral arthrodesis T10-L2 with local autograft allograft and infuse. Surgeon: Kristeen Miss First assistant: Julia Fuelling M.D. Anesthesia: Gen. Endotracheal Indications: T1 1 Crossett is a 76 year old individual who's this past March underwent surgical decompression at the L2-L3 level. [name] postoperative care [name] started complaining of increasing back pain and was noted that [name] had developed a retrolisthesis and degenerative changes at the L1-L2 level. This condition and it progressed over the last several months and [name] is having increasing pain with recurrent symptoms of stenosis including weakness in the proximal lower extremities. [name] is advised regarding the need for surgical decompression and stabilization across the thoracic lumbar junction to include levels of the T10.  Procedure: Patient was brought to the operating supine on a stretcher. After the smooth induction of general endotracheal anesthesia, [name] was carefully turned prone. The bony prominences were appropriately padded and protected. Back was prepped with alcohol and DuraPrep and draped in a sterile fashion. Midline incision previously made incision was carried down to the lumbar dorsal fascia. This was then extended cephalad to include areas of the T10. He'll hardware was identified. The rods which extend down to the L4 area were then loosened and removed. The L2 screws were noted to be loose particularly on the left side. The L2 screws were removed from both sides. Then laminectomy was created removing the inferior marginal  lamina of L1 out to and including the entirety the facet at L1-L2. On the left side there was noted to be a large loose fragment of bone from the superior articular process of the facet. This was removed in a piecemeal fashion and allowed for decompression of lateral gutter and also the disc space at L1-L2. The L1 nerve root was identified protected superiorly. The disc space was then entered on the left side and a discectomy was performed removing a substantial quantity of severely degenerated desiccated disc material from the L1-L2 space. It was apparent that the left-sided L1 screw had eroded through the endplate area this area was decompressed and decorticated with a substantial amount of this being removed from across the midline. The endplates were carefully decorticated and then packed with a combination of autograft allograft and infuse. An 11 mm tall spacer was then placed into the interspace after being packed with autograft allograft and infuse. Total of 6 mL of bone graft was packed into the interspace. Then pedicle entry sites were chosen at T10-T11 and T12 in addition to L1. 6.5 x 45 mm pedicle screws were placed under fluoroscopic guidance into each of the 2 pedicles. The positioning was verified with both AP lateral fluoroscopy. Then the posterior arches were decorticated from T10 down to L1 and lateral masses from L1 down to L3 were decorticated. The remainder of autograft allograft and infuse was packed into these areas. Pedicle screws were then connected with a rod which was designed by bending he construct. The rod was laid into the area and a neutral construct and then the screw caps were applied and torqued to the respective torque. Final radiographs were obtained in AP and lateral projection. Hemostasis in the soft tissues was obtained meticulously and prior to closure of the common dural tube and the L1  and L2 nerve roots were notably well decompressed. With this the lumbar dorsal and  thoracodorsal fascia were reapproximated with #1 Vicryl interrupted fashion 2-0 Vicryl was used in subcutaneous tissues 3-0 Vicryl was used to close subcutaneous take [name] skin Dermabond was used on the skin along with a honeycomb dressing. Total blood loss was approximate 500 mL and 200 mL of Cell Saver blood was returned to the patient.

## 2017-03-18 ENCOUNTER — Encounter (HOSPITAL_COMMUNITY): Payer: Self-pay | Admitting: Neurological Surgery

## 2017-03-18 ENCOUNTER — Other Ambulatory Visit: Payer: Self-pay

## 2017-03-18 LAB — BASIC METABOLIC PANEL
ANION GAP: 8 (ref 5–15)
BUN: 12 mg/dL (ref 6–20)
CO2: 27 mmol/L (ref 22–32)
Calcium: 8.3 mg/dL — ABNORMAL LOW (ref 8.9–10.3)
Chloride: 103 mmol/L (ref 101–111)
Creatinine, Ser: 0.96 mg/dL (ref 0.44–1.00)
GFR calc Af Amer: >60 mL/min (ref >60)
GFR, EST NON AFRICAN AMERICAN: 56 mL/min — AB (ref >60)
Glucose, Bld: 171 mg/dL — ABNORMAL HIGH (ref 65–99)
POTASSIUM: 4.2 mmol/L (ref 3.5–5.1)
Sodium: 138 mmol/L (ref 135–145)

## 2017-03-18 LAB — CBC
HEMATOCRIT: 30.2 % — AB (ref 36.0–46.0)
Hemoglobin: 9.9 g/dL — ABNORMAL LOW (ref 12.0–15.0)
MCH: 27.3 pg (ref 26.0–34.0)
MCHC: 32.8 g/dL (ref 30.0–36.0)
MCV: 83.4 fL (ref 78.0–100.0)
Platelets: 202 10*3/uL (ref 150–400)
RBC: 3.62 MIL/uL — AB (ref 3.87–5.11)
RDW: 15 % (ref 11.5–15.5)
WBC: 9 10*3/uL (ref 4.0–10.5)

## 2017-03-18 LAB — GLUCOSE, CAPILLARY
GLUCOSE-CAPILLARY: 191 mg/dL — AB (ref 65–99)
GLUCOSE-CAPILLARY: 162 mg/dL — AB (ref 65–99)
GLUCOSE-CAPILLARY: 170 mg/dL — AB (ref 65–99)
Glucose-Capillary: 164 mg/dL — ABNORMAL HIGH (ref 65–99)

## 2017-03-18 MED ORDER — DIPHENHYDRAMINE HCL 25 MG PO CAPS: 25.0000 mg | ORAL_CAPSULE | Freq: Every evening | ORAL | Status: DC | PRN

## 2017-03-18 MED ORDER — EZETIMIBE 10 MG PO TABS
10.0000 mg | ORAL_TABLET | Freq: Every day | ORAL | Status: DC
  Administered 2017-03-18 – 2017-03-21 (×4): 10 mg via ORAL
  Filled 2017-03-18 (×4): qty 1

## 2017-03-18 MED ORDER — DIPHENHYDRAMINE-APAP (SLEEP) 25-500 MG PO TABS: 1.0000 | ORAL_TABLET | Freq: Every evening | ORAL | Status: DC | PRN

## 2017-03-18 MED ORDER — HYDROCODONE-ACETAMINOPHEN 5-325 MG PO TABS: 1.0000 | ORAL_TABLET | Freq: Four times a day (QID) | ORAL | Status: DC | PRN

## 2017-03-18 MED ORDER — FLUTICASONE PROPIONATE 50 MCG/ACT NA SUSP
1.0000 | Freq: Every day | NASAL | Status: DC
  Administered 2017-03-18 – 2017-03-21 (×4): 1 via NASAL
  Filled 2017-03-18: qty 16

## 2017-03-18 MED ORDER — LEVOTHYROXINE SODIUM 25 MCG PO TABS
25.0000 ug | ORAL_TABLET | Freq: Every day | ORAL | Status: DC
  Administered 2017-03-18 – 2017-03-21 (×4): 25 ug via ORAL
  Filled 2017-03-18 (×4): qty 1

## 2017-03-18 MED ORDER — MAGNESIUM GLUCONATE 500 MG PO TABS
500.0000 mg | ORAL_TABLET | Freq: Two times a day (BID) | ORAL | Status: DC
  Administered 2017-03-18 – 2017-03-21 (×7): 500 mg via ORAL
  Filled 2017-03-18 (×8): qty 1

## 2017-03-18 MED ORDER — SODIUM CHLORIDE 0.9% FLUSH
3.0000 mL | Freq: Two times a day (BID) | INTRAVENOUS | Status: DC
  Administered 2017-03-18 – 2017-03-20 (×6): 3 mL via INTRAVENOUS

## 2017-03-18 MED ORDER — POTASSIUM CHLORIDE CRYS ER 10 MEQ PO TBCR
10.0000 meq | EXTENDED_RELEASE_TABLET | Freq: Two times a day (BID) | ORAL | Status: DC
  Administered 2017-03-18 – 2017-03-21 (×7): 10 meq via ORAL
  Filled 2017-03-18 (×7): qty 1

## 2017-03-18 MED ORDER — IRBESARTAN 300 MG PO TABS
300.0000 mg | ORAL_TABLET | Freq: Every day | ORAL | Status: DC
  Administered 2017-03-18 – 2017-03-21 (×4): 300 mg via ORAL
  Filled 2017-03-18 (×4): qty 1

## 2017-03-18 MED ORDER — SENNA 8.6 MG PO TABS
1.0000 | ORAL_TABLET | Freq: Two times a day (BID) | ORAL | Status: DC
  Administered 2017-03-18 – 2017-03-20 (×6): 8.6 mg via ORAL
  Filled 2017-03-18 (×7): qty 1

## 2017-03-18 MED ORDER — SODIUM CHLORIDE 0.9 % IV SOLN: 250.0000 mL | INTRAVENOUS | Status: DC

## 2017-03-18 MED ORDER — METHOCARBAMOL 1000 MG/10ML IJ SOLN
500.0000 mg | Freq: Four times a day (QID) | INTRAVENOUS | Status: DC | PRN
  Filled 2017-03-18: qty 5

## 2017-03-18 MED ORDER — IRBESARTAN-HYDROCHLOROTHIAZIDE 300-12.5 MG PO TABS: 1.0000 | ORAL_TABLET | Freq: Every day | ORAL | Status: DC

## 2017-03-18 MED ORDER — INSULIN ASPART 100 UNIT/ML ~~LOC~~ SOLN
6.0000 [IU] | Freq: Every day | SUBCUTANEOUS | Status: DC
  Administered 2017-03-18 – 2017-03-21 (×4): 6 [IU] via SUBCUTANEOUS

## 2017-03-18 MED ORDER — ACETAMINOPHEN 650 MG RE SUPP: 650.0000 mg | RECTAL | Status: DC | PRN

## 2017-03-18 MED ORDER — HYDROCHLOROTHIAZIDE 12.5 MG PO CAPS
12.5000 mg | ORAL_CAPSULE | Freq: Every day | ORAL | Status: DC
  Administered 2017-03-18 – 2017-03-21 (×4): 12.5 mg via ORAL
  Filled 2017-03-18 (×4): qty 1

## 2017-03-18 MED ORDER — FLEET ENEMA 7-19 GM/118ML RE ENEM: 1.0000 | ENEMA | Freq: Once | RECTAL | Status: DC | PRN

## 2017-03-18 MED ORDER — MORPHINE SULFATE (PF) 4 MG/ML IV SOLN
2.0000 mg | INTRAVENOUS | Status: DC | PRN
  Administered 2017-03-18 (×2): 2 mg via INTRAVENOUS
  Filled 2017-03-18 (×2): qty 1

## 2017-03-18 MED ORDER — BISACODYL 10 MG RE SUPP: 10.0000 mg | Freq: Every day | RECTAL | Status: DC | PRN

## 2017-03-18 MED ORDER — PHENOL 1.4 % MT LIQD: 1.0000 | OROMUCOSAL | Status: DC | PRN

## 2017-03-18 MED ORDER — HYDROCODONE-ACETAMINOPHEN 5-325 MG PO TABS: 1.0000 | ORAL_TABLET | ORAL | Status: DC | PRN

## 2017-03-18 MED ORDER — BUDESONIDE 0.25 MG/2ML IN SUSP: 0.2500 mg | Freq: Two times a day (BID) | RESPIRATORY_TRACT | Status: DC

## 2017-03-18 MED ORDER — POLYETHYLENE GLYCOL 3350 17 G PO PACK: 17.0000 g | PACK | Freq: Every day | ORAL | Status: DC | PRN

## 2017-03-18 MED ORDER — MOMETASONE FURO-FORMOTEROL FUM 100-5 MCG/ACT IN AERO
2.0000 | INHALATION_SPRAY | Freq: Two times a day (BID) | RESPIRATORY_TRACT | Status: DC
  Administered 2017-03-18 – 2017-03-21 (×7): 2 via RESPIRATORY_TRACT
  Filled 2017-03-18: qty 8.8

## 2017-03-18 MED ORDER — HYDROCODONE-ACETAMINOPHEN 5-325 MG PO TABS
2.0000 | ORAL_TABLET | ORAL | Status: DC | PRN
  Administered 2017-03-18 – 2017-03-21 (×14): 2 via ORAL
  Filled 2017-03-18 (×14): qty 2

## 2017-03-18 MED ORDER — INSULIN ASPART 100 UNIT/ML ~~LOC~~ SOLN: 0.0000 [IU] | Freq: Three times a day (TID) | SUBCUTANEOUS | Status: DC

## 2017-03-18 MED ORDER — ACETAMINOPHEN 325 MG PO TABS
650.0000 mg | ORAL_TABLET | ORAL | Status: DC | PRN
  Administered 2017-03-20: 650 mg via ORAL
  Filled 2017-03-18: qty 2

## 2017-03-18 MED ORDER — ALBUTEROL SULFATE (2.5 MG/3ML) 0.083% IN NEBU: 3.0000 mL | INHALATION_SOLUTION | Freq: Three times a day (TID) | RESPIRATORY_TRACT | Status: DC | PRN

## 2017-03-18 MED ORDER — METHOCARBAMOL 500 MG PO TABS: 500.0000 mg | ORAL_TABLET | Freq: Four times a day (QID) | ORAL | Status: DC | PRN

## 2017-03-18 MED ORDER — DOCUSATE SODIUM 100 MG PO CAPS
100.0000 mg | ORAL_CAPSULE | Freq: Two times a day (BID) | ORAL | Status: DC
  Administered 2017-03-18 – 2017-03-21 (×7): 100 mg via ORAL
  Filled 2017-03-18 (×7): qty 1

## 2017-03-18 MED ORDER — ONDANSETRON HCL 4 MG/2ML IJ SOLN
4.0000 mg | Freq: Four times a day (QID) | INTRAMUSCULAR | Status: DC | PRN
  Administered 2017-03-18: 4 mg via INTRAVENOUS
  Filled 2017-03-18: qty 2

## 2017-03-18 MED ORDER — ONDANSETRON HCL 4 MG PO TABS: 4.0000 mg | ORAL_TABLET | Freq: Four times a day (QID) | ORAL | Status: DC | PRN

## 2017-03-18 MED ORDER — INSULIN ASPART 100 UNIT/ML ~~LOC~~ SOLN
9.0000 [IU] | Freq: Every day | SUBCUTANEOUS | Status: DC
  Administered 2017-03-18 – 2017-03-20 (×3): 9 [IU] via SUBCUTANEOUS

## 2017-03-18 MED ORDER — LACTATED RINGERS IV SOLN
INTRAVENOUS | Status: DC
  Administered 2017-03-18 (×2): via INTRAVENOUS

## 2017-03-18 MED ORDER — ALUM & MAG HYDROXIDE-SIMETH 200-200-20 MG/5ML PO SUSP
30.0000 mL | Freq: Four times a day (QID) | ORAL | Status: DC | PRN
  Administered 2017-03-18 – 2017-03-19 (×2): 30 mL via ORAL
  Filled 2017-03-18 (×2): qty 30

## 2017-03-18 MED ORDER — SODIUM CHLORIDE 0.9% FLUSH: 3.0000 mL | INTRAVENOUS | Status: DC | PRN

## 2017-03-18 MED ORDER — LORATADINE 10 MG PO TABS
10.0000 mg | ORAL_TABLET | Freq: Every day | ORAL | Status: DC
  Administered 2017-03-18 – 2017-03-21 (×4): 10 mg via ORAL
  Filled 2017-03-18 (×4): qty 1

## 2017-03-18 MED ORDER — INSULIN GLARGINE 100 UNIT/ML ~~LOC~~ SOLN
24.0000 [IU] | Freq: Every day | SUBCUTANEOUS | Status: DC
  Administered 2017-03-18 – 2017-03-21 (×4): 24 [IU] via SUBCUTANEOUS
  Filled 2017-03-18 (×4): qty 0.24

## 2017-03-18 MED ORDER — ACETAMINOPHEN 500 MG PO TABS: 500.0000 mg | ORAL_TABLET | Freq: Every evening | ORAL | Status: DC | PRN

## 2017-03-18 MED ORDER — GABAPENTIN 300 MG PO CAPS
300.0000 mg | ORAL_CAPSULE | Freq: Four times a day (QID) | ORAL | Status: DC
  Administered 2017-03-18 – 2017-03-21 (×12): 300 mg via ORAL
  Filled 2017-03-18 (×12): qty 1

## 2017-03-18 MED ORDER — MENTHOL 3 MG MT LOZG: 1.0000 | LOZENGE | OROMUCOSAL | Status: DC | PRN

## 2017-03-18 MED ORDER — CEFAZOLIN SODIUM-DEXTROSE 2-4 GM/100ML-% IV SOLN
2.0000 g | Freq: Three times a day (TID) | INTRAVENOUS | Status: AC
  Administered 2017-03-18 (×2): 2 g via INTRAVENOUS
  Filled 2017-03-18 (×2): qty 100

## 2017-03-18 MED ORDER — INSULIN ASPART 100 UNIT/ML ~~LOC~~ SOLN
10.0000 [IU] | Freq: Every day | SUBCUTANEOUS | Status: DC
  Administered 2017-03-18 – 2017-03-20 (×3): 10 [IU] via SUBCUTANEOUS

## 2017-03-18 NOTE — Anesthesia Postprocedure Evaluation (Signed)
Anesthesia Post Note  Patient: MARSHE SHRESTHA  Procedure(s) Performed: Decompression of Lumbar One-Two with Thoracic Ten to Lumbar Two Fusion (N/A Spine Lumbar)     Patient location during evaluation: PACU Anesthesia Type: General Level of consciousness: awake and alert, patient cooperative and oriented Pain management: pain level controlled Vital Signs Assessment: post-procedure vital signs reviewed and stable Respiratory status: spontaneous breathing, nonlabored ventilation, respiratory function stable and patient connected to nasal cannula oxygen Cardiovascular status: blood pressure returned to baseline and stable Postop Assessment: no apparent nausea or vomiting Anesthetic complications: no    Last Vitals:  Vitals:   03/18/17 2016 03/18/17 2119  BP: (!) 103/51   Pulse: 83   Resp: 17   Temp: 37.2 C   SpO2: 97% 96%    Last Pain:  Vitals:   03/18/17 2016  TempSrc: Oral  PainSc:                  Kacen Mellinger,E. Clemence Lengyel

## 2017-03-18 NOTE — Progress Notes (Signed)
Vital signs are stable Patient does get hypotensive with IV morphine Hemoglobin is 9.9 postop We'll recheck hemoglobin tomorrow Patient's past postsurgical care has taken some time After early discharge last time she required readmission Would consider patient for inpatient rehabilitation here Appreciate help of diabetes coordinator

## 2017-03-18 NOTE — Care Management Note (Signed)
Case Management Note  Patient Details  Name: [name] MRN: [id number] Date of Birth: [birth year]  Subjective/Objective:   76 yo admitted for T10-L2 PLIF.   PTA, pt independent, lives at home alone.  Sister at bedside.               Action/Plan: PT/OT recommending HH follow up, and pt agreeable to [facility] services.  She prefers [facility] for HHPT/OT, as she has used in the past.  MD: please leave orders for HHPT/OT as recommended by therapies.  No DME needs, per pt.    Expected Discharge Date:                  Expected Discharge Plan:  [place]  In-House Referral:     Discharge planning Services  CM Consult  Post Acute Care Choice:  Home Health Choice offered to:  Patient  DME Arranged:    DME Agency:     HH Arranged:  PT, OT HH Agency:  [facility]  Status of Service:  In process, will continue to follow  If discussed at Long Length of Stay Meetings, dates discussed:    Additional Comments:  Reinaldo Raddle, RN, BSN  Trauma/Neuro ICU Case Manager [phone]

## 2017-03-18 NOTE — Progress Notes (Signed)
PT Cancellation Note  Patient Details Name: [name] MRN: [id number] DOB: 1940-08-10   Cancelled Treatment:    Reason Eval/Treat Not Completed: Pain limiting ability to participate(pt reports 10/10 pain without PO pain meds. RN notified of request for meds)   [name] [name] 03/18/2017, 7:40 AM  Elwyn Reach, [place]

## 2017-03-18 NOTE — Progress Notes (Signed)
Orthopedic Tech Progress Note Patient Details:  [name] 05-03-1940 [id number]  Patient ID: [name], female   DOB: Aug 25, 1940, 76 y.o.   MRN: [id number]   [name] 03/18/2017, 10:11 AMCalled [employer] for Lumbar brace.

## 2017-03-18 NOTE — Evaluation (Signed)
Physical Therapy Evaluation Patient Details Name: [name] MRN: [id number] DOB: 1940/07/12 Today's Date: 03/18/2017   History of Present Illness  76 yo admitted for T10-L2 PLIF. PMHx: prior fusions from L2-S1, DM, fibromyalgia  Clinical Impression  Pt with flat affect, very concerned with pain medicine although she received exactly on time at beginning of session. Pt educated for all precautions with handout provided. Pt moving very slowly and cautiously. Pt with decreased strength, transfers and gait who will benefit from acute therapy to maximize mobility, function, independence and adherence to precautions. Min cues needed to don brace.      Follow Up Recommendations Home health PT;Supervision - Intermittent    Equipment Recommendations  None recommended by PT    Recommendations for Other Services       Precautions / Restrictions Precautions Precautions: Back Precaution Booklet Issued: Yes (comment) Required Braces or Orthoses: Spinal Brace Spinal Brace: Applied in sitting position Restrictions Weight Bearing Restrictions: No      Mobility  Bed Mobility Overal bed mobility: Needs Assistance Bed Mobility: Sidelying to Sit   Sidelying to sit: Supervision     Sit to sidelying: Mod assist(A to lift legs back onto bed) General bed mobility comments: pt left sidelying on arrival with cues for sequence to sit and pt able to perform with heavy use of rail and increased time, pt denied assist  Transfers Overall transfer level: Needs assistance Equipment used: Rolling walker (2 wheeled) Transfers: Sit to/from Stand Sit to Stand: Min guard         General transfer comment: cues for hand placement, slow transition to standing with pt again refusing assist  Ambulation/Gait Ambulation/Gait assistance: Min guard Ambulation Distance (Feet): 40 Feet Assistive device: Rolling walker (2 wheeled) Gait Pattern/deviations: Step-through pattern;Decreased stride length    Gait velocity interpretation: Below normal speed for age/gender General Gait Details: pt with very slow cautious gait and cues for posture and position in RW  Stairs            Wheelchair Mobility    Modified Rankin (Stroke Patients Only)       Balance Overall balance assessment: Needs assistance   Sitting balance-Leahy Scale: Good       Standing balance-Leahy Scale: Fair                               Pertinent Vitals/Pain Pain Assessment: 0-10 Pain Score: 8  Pain Location: back Pain Descriptors / Indicators: Constant Pain Intervention(s): Limited activity within patient's tolerance;Repositioned;RN gave pain meds during session;Monitored during session    [place] expects to be discharged to:: Private residence Living Arrangements: Alone Available Help at Discharge: Friend(s);Family;Available PRN/intermittently Type of Home: House Home Access: Stairs to enter Entrance Stairs-Rails: Right Entrance Stairs-Number of Steps: 2 Home Layout: Two level;Able to live on main level with bedroom/bathroom Home Equipment: Walker - 4 wheels;Bedside commode;Shower seat;Grab bars - tub/shower;Grab bars - toilet;Hand held shower head;Adaptive equipment      Prior Function Level of Independence: Independent;Independent with assistive device(s)         Comments: used cane out in community     Hand Dominance   Dominant Hand: Right    Extremity/Trunk Assessment   Upper Extremity Assessment Upper Extremity Assessment: Defer to OT evaluation    Lower Extremity Assessment Lower Extremity Assessment: Generalized weakness    Cervical / Trunk Assessment Cervical / Trunk Assessment: Other exceptions Cervical / Trunk Exceptions: back fusion  Communication   Communication: No difficulties  Cognition Arousal/Alertness: Awake/alert Behavior During Therapy: Flat affect Overall Cognitive Status: Within Functional Limits for tasks assessed                                  General Comments: pt perseverating on pain      General Comments      Exercises     Assessment/Plan    PT Assessment Patient needs continued PT services  PT Problem List Decreased strength;Decreased mobility;Decreased activity tolerance;Decreased knowledge of use of DME;Pain;Decreased knowledge of precautions       PT Treatment Interventions      PT Goals (Current goals can be found in the Care Plan section)  Acute Rehab PT Goals Patient Stated Goal: to be out of pain and return to water aerobics PT Goal Formulation: With patient/family Time For Goal Achievement: 04/01/17 Potential to Achieve Goals: Fair    Frequency Min 5X/week   Barriers to discharge Decreased caregiver support      Co-evaluation               AM-PAC PT "6 Clicks" Daily Activity  Outcome Measure Difficulty turning over in bed (including adjusting bedclothes, sheets and blankets)?: A Lot Difficulty moving from lying on back to sitting on the side of the bed? : Unable Difficulty sitting down on and standing up from a chair with arms (e.g., wheelchair, bedside commode, etc,.)?: A Lot Help needed moving to and from a bed to chair (including a wheelchair)?: A Little Help needed walking in hospital room?: A Little Help needed climbing 3-5 steps with a railing? : A Little 6 Click Score: 14    End of Session Equipment Utilized During Treatment: Gait belt;Back brace Activity Tolerance: Patient limited by fatigue;Patient limited by pain Patient left: in chair;with call bell/phone within reach;with family/visitor present Nurse Communication: Mobility status;Precautions PT Visit Diagnosis: Other abnormalities of gait and mobility (R26.89);Muscle weakness (generalized) (M62.81)    Time: 1610-9604 PT Time Calculation (min) (ACUTE ONLY): 36 min   Charges:   PT Evaluation $PT Eval Moderate Complexity: 1 Mod PT Treatments $Gait Training: 8-22 mins   PT G  Codes:        Elwyn Reach, PT [phone]   [name] 03/18/2017, 12:28 PM

## 2017-03-18 NOTE — Progress Notes (Signed)
Occupational Therapy Evaluation and Treatment Patient Details Name: [name] MRN: [id number] DOB: Oct 19, 1940 Today's Date: 03/18/2017    History of Present Illness 76 yo s/p Decompression of L1-L2 posterior lumbar interbody arthrodesis and Segmental fixation from T10-L2 with pedicle screws and posterior and posterior lateral arthrodesis T10-L2 with local autograft allograft. PMH significant for fibromyalgia, DM, HTN, previous back surgeries per pt.   Clinical Impression   PTA, pt lived alone and was modified independent with mobility and ADL. Used a cane to mobilize in the community. 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 Attempted mobility during first session, however pt stating she was in too much pain (premedicated prior to session). Returned to complete evaluation and pt mobilized to bathroom with min A (with +2 per pt request although not physically needed) and began education regarding back precautions for ADL. Sister is to bring pt's back brace this am. Plan is for pt to DC home. Recommend follow up with [facility]. Will follow acutely to facilitate safe DC home with intermittent S.     Follow Up Recommendations  Home health OT;Supervision - Intermittent    Equipment Recommendations  None recommended by OT    Recommendations for Other Services PT consult     Precautions / Restrictions Precautions Precautions: Back Precaution Booklet Issued: No Required Braces or Orthoses: Spinal Brace Spinal Brace: Applied in sitting position(OK to remove for bathing and for trips to bathroom) Restrictions Weight Bearing Restrictions: No      Mobility Bed Mobility Overal bed mobility: Needs Assistance Bed Mobility: Sidelying to Sit;Sit to Sidelying   Sidelying to sit: Min assist     Sit to sidelying: Mod assist(A to lift legs back onto bed)    Transfers Overall transfer level: Needs assistance Equipment used: Rolling walker (2 wheeled) Transfers: Sit to/from Stand Sit to Stand:  Min assist;+2 safety/equipment(per pt request to have +2 assist)         General transfer comment: Pt pushing up with BUE on RW although educted to push up from bed    Balance Overall balance assessment: Needs assistance   Sitting balance-Leahy Scale: Good       Standing balance-Leahy Scale: Fair                             ADL either performed or assessed with clinical judgement   ADL Overall ADL's : Needs assistance/impaired Eating/Feeding: Modified independent   Grooming: Set up;Supervision/safety;Sitting   Upper Body Bathing: Set up;Supervision/ safety;Sitting   Lower Body Bathing: Moderate assistance;Sit to/from stand   Upper Body Dressing : Minimal assistance;Sitting   Lower Body Dressing: Moderate assistance;Sit to/from stand   Toilet Transfer: Minimal assistance;RW;Ambulation;Comfort height toilet;Grab bars   Toileting- Clothing Manipulation and Hygiene: Minimal assistance;Sit to/from stand;Sitting/lateral lean       Functional mobility during ADLs: Minimal assistance;Rolling walker;Cueing for safety(cues for positioning in RW) General ADL Comments: PT appeasr anxious and agitated at times, stating "everyone should have to have back surgery so they would know the pain". Began education regarding back precuations. Pt states she knew what to do becuase she has had back surgery before.      Vision         Perception     Praxis      Pertinent Vitals/Pain Pain Assessment: 0-10 Pain Score: 8  Pain Location: back Pain Descriptors / Indicators: Burning Pain Intervention(s): Limited activity within patient's tolerance     Hand Dominance Right  Extremity/Trunk Assessment Upper Extremity Assessment Upper Extremity Assessment: Overall WFL for tasks assessed   Lower Extremity Assessment Lower Extremity Assessment: Defer to PT evaluation(Complains of R knee arthritis)   Cervical / Trunk Assessment Cervical / Trunk Assessment: Other  exceptions Cervical / Trunk Exceptions: back fusion   Communication Communication Communication: No difficulties   Cognition Arousal/Alertness: Awake/alert Behavior During Therapy: Agitated Overall Cognitive Status: Within Functional Limits for tasks assessed                                     General Comments       Exercises     Shoulder Instructions      Home Living Family/patient expects to be discharged to:: Private residence Living Arrangements: Alone Available Help at Discharge: Friend(s);Family;Available PRN/intermittently Type of Home: House Home Access: Stairs to enter [employer] of Steps: 2 Entrance Stairs-Rails: Right Home Layout: Two level;Able to live on main level with bedroom/bathroom Alternate Level Stairs-Number of Steps: flight Alternate Level Stairs-Rails: Right;Left Bathroom Shower/Tub: Walk-in shower;Door   [employer] Toilet: Handicapped height Bathroom Accessibility: Yes How Accessible: Accessible via walker Home Equipment: [place] - 4 wheels;Bedside commode;Shower seat;Grab bars - tub/shower;Grab bars - toilet;Hand held shower head;Adaptive equipment Adaptive Equipment: Reacher;Sock aid;Long-handled sponge        Prior Functioning/Environment Level of Independence: Independent;Independent with assistive device(s)        Comments: used cane out in community        OT Problem List: Decreased strength;Decreased range of motion;Decreased activity tolerance;Decreased knowledge of use of DME or AE;Decreased knowledge of precautions;Pain      OT Treatment/Interventions: Self-care/ADL training;DME and/or AE instruction;Therapeutic activities;Patient/family education    OT Goals(Current goals can be found in the care plan section) Acute Rehab OT Goals Patient Stated Goal: to stay as long as she needs OT Goal Formulation: With patient Time For Goal Achievement: 04/01/17 Potential to Achieve Goals: Good  OT Frequency:  Min 3X/week   Barriers to D/C:            Co-evaluation              AM-PAC PT "6 Clicks" Daily Activity     Outcome Measure Help from another person eating meals?: None Help from another person taking care of personal grooming?: A Little Help from another person toileting, which includes using toliet, bedpan, or urinal?: A Little Help from another person bathing (including washing, rinsing, drying)?: A Little Help from another person to put on and taking off regular upper body clothing?: A Little Help from another person to put on and taking off regular lower body clothing?: A Lot 6 Click Score: 18   End of Session Equipment Utilized During Treatment: Gait belt;Rolling walker Nurse Communication: Mobility status;Precautions  Activity Tolerance: Patient tolerated treatment well Patient left: in bed;with call bell/phone within reach;with bed alarm set;with family/visitor present  OT Visit Diagnosis: Unsteadiness on feet (R26.81);Muscle weakness (generalized) (M62.81);Pain Pain - Right/Left: Right Pain - part of body: Hip(back)                Time: 6767-2094 OT Time Calculation (min): 20 min  2nd visit: 1017-1057 OT Time: 37 min Charges:  OT General Charges $OT Visit: 1 Visit OT Evaluation $OT Eval Moderate Complexity: 1 Mod OT Treatments $Self Care/Home Management : 23-37 mins G-Codes:     [facility], OT/L  [phone] 03/18/2017  Fenris Cauble,HILLARY 03/18/2017, 11:45 AM

## 2017-03-18 NOTE — Clinical Social Work Note (Signed)
CSW acknowledges SNF consult. PT recommending HHPT.  CSW signing off. Consult again if any other social work needs arise.  Yoel Kaufhold, CSW [phone]  

## 2017-03-18 NOTE — Progress Notes (Signed)
Inpatient Diabetes Program Recommendations  AACE/ADA: New Consensus Statement on Inpatient Glycemic Control (2015)  Target Ranges:  Prepandial:   less than 140 mg/dL      Peak postprandial:   less than 180 mg/dL (1-2 hours)      Critically ill patients:  140 - 180 mg/dL   Review of Glycemic Control  Diabetes history: DM 2 Outpatient Diabetes medications: Lantus 24 units, Novolog 6 units breakfast, 10 units lunch, 9 units supper Current orders for Inpatient glycemic control: Lantus 24 units, Novolog 6 units breakfast, 10 units lunch, 9 units supper, Novolog Sensitive Correction tid  Inpatient Diabetes Program Recommendations:    Consult for DM management after surgery. A1c 6.5% on 03/12/17. Patient currently ordered home regimen. Noted patient received Decadron 10 mg during procedure yesterday. Lab glucose this am >140 mg/dl. Based on orders patient needs glycemic control order set. Placed order for CBG checks and Novolog Sensitive Correction tid to follow trends per Dr. Ellene Route to [place].  Will continue to follow.  Thanks,  Tama Headings RN, MSN, [facility] Inpatient Diabetes Coordinator Team Pager [phone] (8a-5p)

## 2017-03-19 LAB — CBC
HEMATOCRIT: 28.3 % — AB (ref 36.0–46.0)
HEMOGLOBIN: 9.1 g/dL — AB (ref 12.0–15.0)
MCH: 27.2 pg (ref 26.0–34.0)
MCHC: 32.2 g/dL (ref 30.0–36.0)
MCV: 84.5 fL (ref 78.0–100.0)
Platelets: 193 10*3/uL (ref 150–400)
RBC: 3.35 MIL/uL — AB (ref 3.87–5.11)
RDW: 15.3 % (ref 11.5–15.5)
WBC: 9.3 10*3/uL (ref 4.0–10.5)

## 2017-03-19 LAB — GLUCOSE, CAPILLARY
GLUCOSE-CAPILLARY: 145 mg/dL — AB (ref 65–99)
GLUCOSE-CAPILLARY: 206 mg/dL — AB (ref 65–99)
GLUCOSE-CAPILLARY: 206 mg/dL — AB (ref 65–99)
Glucose-Capillary: 92 mg/dL (ref 65–99)

## 2017-03-19 MED FILL — Sodium Chloride IV Soln 0.9%: INTRAVENOUS | Qty: 1000 | Status: AC

## 2017-03-19 MED FILL — Heparin Sodium (Porcine) Inj 1000 Unit/ML: INTRAMUSCULAR | Qty: 30 | Status: AC

## 2017-03-19 NOTE — Progress Notes (Signed)
   03/19/17 1600  Incision (Closed) 03/17/17 Back Other (Comment)  Date First Assessed/Time First Assessed: 03/17/17 1800   Location: Back  Location Orientation: Other (Comment)  Dressing Type [place]comb  Dressing Clean;Dry;Intact  Site / Wound Assessment Dressing in place / Unable to assess  Drainage Amount Scant

## 2017-03-19 NOTE — Care Management Note (Signed)
Case Management Note  Patient Details  Name: DONA WALBY MRN: [id number] Date of Birth: 02-25-1941  Subjective/Objective:   76 yo admitted for T10-L2 PLIF.   PTA, pt independent, lives at home alone.  Sister at bedside.               Action/Plan: PT/OT recommending HH follow up, and pt agreeable to [facility] services.  She prefers [facility] for HHPT/OT, as she has used in the past.  MD: please leave orders for HHPT/OT as recommended by therapies.  No DME needs, per pt.    Expected Discharge Date:                  Expected Discharge Plan:  Skilled Nursing Facility  In-House Referral:  Clinical Social Work  Discharge planning Services  CM Consult  Post Acute Care Choice:    Choice offered to:  Patient  DME Arranged:    DME Agency:     HH Arranged:    [place] Agency:     Status of Service:  In process, will continue to follow  If discussed at Long Length of Stay Meetings, dates discussed:    Additional Comments:  03/19/17 J. Elliot Meldrum, RN, BSN PT now recommending SNF for short term rehab at discharge, and pt agreeable to this, per bedside nurse.  CSW notified to facilitate discharge to SNF upon medical stability.    Reinaldo Raddle, RN, BSN  Trauma/Neuro ICU Case Manager [phone]

## 2017-03-19 NOTE — Progress Notes (Signed)
Physical Therapy Treatment Patient Details Name: [name] MRN: [id number] DOB: 27-Nov-1940 Today's Date: 03/19/2017    History of Present Illness 76 yo admitted for T10-L2 PLIF. PMHx: prior fusions from L2-S1, DM, fibromyalgia    PT Comments    Patient seen for mobility progression s/p spinal surgery. Mobilizing well but continues to required physical assist for transfers and bed mobility. Patient concerned for mobility and safety at home as she lives alone. Agree with altering d/c plan to post acute rehabilitation given patient's fall history after previous surgery.     Follow Up Recommendations  SNF(post acute rehabilitation)     Equipment Recommendations  None recommended by PT    Recommendations for Other Services       Precautions / Restrictions Precautions Precautions: Back Precaution Booklet Issued: Yes (comment) Required Braces or Orthoses: Spinal Brace Spinal Brace: Applied in sitting position Restrictions Weight Bearing Restrictions: No    Mobility  Bed Mobility Overal bed mobility: Needs Assistance Bed Mobility: Rolling;Sidelying to Sit Rolling: Supervision Sidelying to sit: Min assist       General bed mobility comments: Min assist to elevate trunk to upright  Transfers Overall transfer level: Needs assistance Equipment used: Rolling walker (2 wheeled) Transfers: Sit to/from Stand Sit to Stand: Min guard;Min assist         General transfer comment: VCs for hand placement, min guard from elevated surface, min assist from standard surface  Ambulation/Gait Ambulation/Gait assistance: Min guard Ambulation Distance (Feet): 120 Feet(with standing rest break) Assistive device: Rolling walker (2 wheeled) Gait Pattern/deviations: Step-through pattern;Decreased stride length Gait velocity: decreased Gait velocity interpretation: Below normal speed for age/gender General Gait Details: remains guarded, max VCs fdr increased cadence and  precautions   Stairs            Wheelchair Mobility    Modified Rankin (Stroke Patients Only)       Balance Overall balance assessment: Needs assistance   Sitting balance-Leahy Scale: Good     Standing balance support: Bilateral upper extremity supported Standing balance-Leahy Scale: Fair                              Cognition Arousal/Alertness: Awake/alert Behavior During Therapy: Flat affect Overall Cognitive Status: Within Functional Limits for tasks assessed                                        Exercises      General Comments        Pertinent Vitals/Pain Pain Assessment: 0-10 Pain Score: 8  Pain Location: back Pain Descriptors / Indicators: Constant Pain Intervention(s): Monitored during session    Home Living                      Prior Function            PT Goals (current goals can now be found in the care plan section) Acute Rehab PT Goals Patient Stated Goal: to be out of pain and return to water aerobics PT Goal Formulation: With patient/family Time For Goal Achievement: 04/01/17 Potential to Achieve Goals: Fair Progress towards PT goals: Progressing toward goals    Frequency    Min 5X/week      PT Plan Discharge plan needs to be updated    Co-evaluation  AM-PAC PT "6 Clicks" Daily Activity  Outcome Measure  Difficulty turning over in bed (including adjusting bedclothes, sheets and blankets)?: A Lot Difficulty moving from lying on back to sitting on the side of the bed? : Unable Difficulty sitting down on and standing up from a chair with arms (e.g., wheelchair, bedside commode, etc,.)?: A Lot Help needed moving to and from a bed to chair (including a wheelchair)?: A Little Help needed walking in hospital room?: A Little Help needed climbing 3-5 steps with a railing? : A Little 6 Click Score: 14    End of Session Equipment Utilized During Treatment: Gait belt;Back  brace Activity Tolerance: Patient limited by fatigue;Patient limited by pain Patient left: in chair;with call bell/phone within reach;with family/visitor present Nurse Communication: Mobility status;Precautions PT Visit Diagnosis: Other abnormalities of gait and mobility (R26.89);Muscle weakness (generalized) (M62.81)     Time: 0762-2633 PT Time Calculation (min) (ACUTE ONLY): 22 min  Charges:  $Gait Training: 8-22 mins                    G Codes:       Alben Deeds, PT DPT  Board Certified Neurologic Specialist [place] 03/19/2017, 9:52 AM

## 2017-03-19 NOTE — Progress Notes (Signed)
Vital signs are stable Hemoglobin has not decreased much further C/o right hip pain Dressing clean and dry Patient lives alone and previous hospitalization resulted in readmission secondary to poor pain control, confusion with self medication I believe she would be good candidate for short term rehab, but she will not be discharged prematurely.

## 2017-03-19 NOTE — Progress Notes (Signed)
Physical medicine rehabilitation consult requested chart reviewed. Currently physical and occupational therapy are recommending home health therapies. Hold on formal rehabilitation consult and follow from a distance.

## 2017-03-19 NOTE — NC FL2 (Signed)
[place] LEVEL OF CARE SCREENING TOOL     IDENTIFICATION  Patient Name: [name] Birthdate: 04/13/1941 Sex: female Admission Date (Current Location): 03/17/2017  [facility] and [place] Number:  Herbalist and Address:  [facility] [place], [place] [address], [place],  [address]      Provider Number: [id number]  Attending Physician Name and Address:  Kristeen Miss, MD  Relative Name and Phone Number:       Current Level of Care: Hospital Recommended Level of Care: [place] Prior Approval Number:    Date Approved/Denied:   PASRR Number: [id number]  Discharge Plan: SNF    Current Diagnoses: Patient Active Problem List   Diagnosis Date Noted  . Lumbar stenosis with neurogenic claudication 03/17/2017  . Spinal stenosis of lumbar region 03/03/2017  . Myoclonic jerking   . Nausea   . Acute encephalopathy 07/15/2016  . Lumbar radiculopathy 06/27/2016  . Hypothyroidism 06/26/2016  . Labile blood glucose   . Surgery, elective   . Post-operative pain   . Sickle cell trait ([place])   . Acute blood loss anemia   . History of back surgery   . AKI (acute kidney injury) ([place])   . Herniated nucleus pulposus, L2-3 06/25/2016  . Bilateral primary osteoarthritis of knee 03/26/2016  . Osteoarthritis, hand 03/26/2016  . Other insomnia 03/26/2016  . Memory disorder 02/16/2016  . Fibromyalgia 09/27/2014  . Nocturnal leg cramps 09/27/2014  . Body mass index (BMI) of 30.0-30.9 in adult 08/04/2014  . Neuropathy 03/10/2014  . Allergic rhinitis 03/10/2014  . Osteoporosis 03/10/2014  . Spinal stenosis of lumbar region with radiculopathy 02/28/2014  . Type II or unspecified type diabetes mellitus without mention of complication, uncontrolled 07/08/2013  . Hypokalemia 11/02/2012  . Essential hypertension, benign 11/02/2012  . Diabetes mellitus with neuropathy ([place]) 10/29/2012  . Hyperlipidemia 10/29/2012  .  Spondylolisthesis of lumbar region 03/19/2011  . Lumbar radicular pain 03/19/2011    Orientation RESPIRATION BLADDER Height & Weight     Self, Time, Situation, Place  Normal Continent Weight: 182 lb 1.6 oz (82.6 kg) Height:  5\' 5"  (165.1 cm)  BEHAVIORAL SYMPTOMS/MOOD NEUROLOGICAL BOWEL NUTRITION STATUS  (None) (None) Continent Diet(Carb modified)  AMBULATORY STATUS COMMUNICATION OF NEEDS Skin   Limited Assist Verbally Surgical wounds                       Personal Care Assistance Level of Assistance  Bathing, Feeding, Dressing Bathing Assistance: Limited assistance Feeding assistance: Independent(Modified) Dressing Assistance: Limited assistance     Functional Limitations Info  Sight, Hearing, Speech Sight Info: Adequate Hearing Info: Adequate Speech Info: Adequate    SPECIAL CARE FACTORS FREQUENCY  PT (By licensed PT), OT (By licensed OT)     PT Frequency: 5 x week OT Frequency: 5 x week            Contractures Contractures Info: Not present    Additional Factors Info  Code Status, Allergies Code Status Info: Full Allergies Info: Cymbalta (Duloxetine Hcl), Baclofen, Betadine (Povidone Iodine), Adhesive (Tape), Lipitor (Atorvastatin).           Current Medications (03/19/2017):  This is the current hospital active medication list Current Facility-Administered Medications  Medication Dose Route Frequency Provider Last Rate Last Dose  . 0.9 %  sodium chloride infusion  250 mL Intravenous Continuous Kristeen Miss, MD      . acetaminophen (TYLENOL) tablet 650 mg  650 mg Oral Q4H  PRN Kristeen Miss, MD       Or  . acetaminophen (TYLENOL) suppository 650 mg  650 mg Rectal Q4H PRN Kristeen Miss, MD      . diphenhydrAMINE (BENADRYL) capsule 25 mg  25 mg Oral QHS PRN Kristeen Miss, MD       And  . acetaminophen (TYLENOL) tablet 500 mg  500 mg Oral QHS PRN Kristeen Miss, MD      . albuterol (PROVENTIL) (2.5 MG/3ML) 0.083% nebulizer solution 3 mL  3 mL Inhalation  TID PRN Kristeen Miss, MD      . alum & mag hydroxide-simeth (MAALOX/MYLANTA) 200-200-20 MG/5ML suspension 30 mL  30 mL Oral Q6H PRN Kristeen Miss, MD   30 mL at 03/19/17 1001  . bisacodyl (DULCOLAX) suppository 10 mg  10 mg Rectal Daily PRN Kristeen Miss, MD      . docusate sodium (COLACE) capsule 100 mg  100 mg Oral BID Kristeen Miss, MD   100 mg at 03/19/17 0947  . ezetimibe (ZETIA) tablet 10 mg  10 mg Oral Daily Kristeen Miss, MD   10 mg at 03/19/17 0949  . fluticasone (FLONASE) 50 MCG/ACT nasal spray 1 spray  1 spray Each Nare Daily Kristeen Miss, MD   1 spray at 03/19/17 0951  . gabapentin (NEURONTIN) capsule 300 mg  300 mg Oral QID Kristeen Miss, MD   300 mg at 03/19/17 0947  . hydrochlorothiazide (MICROZIDE) capsule 12.5 mg  12.5 mg Oral Daily Kristeen Miss, MD   12.5 mg at 03/19/17 0947  . HYDROcodone-acetaminophen (NORCO/VICODIN) 5-325 MG per tablet 1 tablet  1 tablet Oral Q4H PRN Kristeen Miss, MD      . HYDROcodone-acetaminophen (NORCO/VICODIN) 5-325 MG per tablet 2 tablet  2 tablet Oral Q4H PRN Kristeen Miss, MD   2 tablet at 03/19/17 0946  . insulin aspart (novoLOG) injection 10 Units  10 Units Subcutaneous Q lunch Kristeen Miss, MD   10 Units at 03/18/17 1311  . insulin aspart (novoLOG) injection 6 Units  6 Units Subcutaneous Q breakfast Kristeen Miss, MD   6 Units at 03/19/17 0900  . insulin aspart (novoLOG) injection 9 Units  9 Units Subcutaneous Q supper Kristeen Miss, MD   9 Units at 03/18/17 1724  . insulin glargine (LANTUS) injection 24 Units  24 Units Subcutaneous Daily Kristeen Miss, MD   24 Units at 03/19/17 0945  . irbesartan (AVAPRO) tablet 300 mg  300 mg Oral Daily Kristeen Miss, MD   300 mg at 03/19/17 0947  . lactated ringers infusion   Intravenous Continuous Kristeen Miss, MD   Stopped at 03/18/17 1200  . levothyroxine (SYNTHROID, LEVOTHROID) tablet 25 mcg  25 mcg Oral QAC breakfast Kristeen Miss, MD   25 mcg at 03/18/17 0954  . loratadine (CLARITIN) tablet 10 mg  10 mg  Oral Daily Kristeen Miss, MD   10 mg at 03/19/17 0946  . magnesium gluconate (MAGONATE) tablet 500 mg  500 mg Oral BID Kristeen Miss, MD   500 mg at 03/19/17 0947  . menthol-cetylpyridinium (CEPACOL) lozenge 3 mg  1 lozenge Oral PRN Kristeen Miss, MD       Or  . phenol (CHLORASEPTIC) mouth spray 1 spray  1 spray Mouth/Throat PRN Kristeen Miss, MD      . methocarbamol (ROBAXIN) tablet 500 mg  500 mg Oral Q6H PRN Kristeen Miss, MD       Or  . methocarbamol (ROBAXIN) 500 mg in dextrose 5 % 50 mL IVPB  500 mg Intravenous Q6H PRN  Kristeen Miss, MD      . mometasone-formoterol [facility]) 100-5 MCG/ACT inhaler 2 puff  2 puff Inhalation BID Kristeen Miss, MD   2 puff at 03/19/17 1040  . morphine 4 MG/ML injection 2 mg  2 mg Intravenous Q2H PRN Kristeen Miss, MD   2 mg at 03/18/17 1512  . ondansetron (ZOFRAN) tablet 4 mg  4 mg Oral Q6H PRN Kristeen Miss, MD       Or  . ondansetron (ZOFRAN) injection 4 mg  4 mg Intravenous Q6H PRN Kristeen Miss, MD   4 mg at 03/18/17 0545  . polyethylene glycol (MIRALAX / GLYCOLAX) packet 17 g  17 g Oral Daily PRN Kristeen Miss, MD      . potassium chloride (K-DUR,KLOR-CON) CR tablet 10 mEq  10 mEq Oral BID Kristeen Miss, MD   10 mEq at 03/19/17 0947  . senna (SENOKOT) tablet 8.6 mg  1 tablet Oral BID Kristeen Miss, MD   8.6 mg at 03/19/17 0946  . sodium chloride flush (NS) 0.9 % injection 3 mL  3 mL Intravenous Austin Miles, MD   3 mL at 03/19/17 0952  . sodium chloride flush (NS) 0.9 % injection 3 mL  3 mL Intravenous PRN Kristeen Miss, MD      . sodium phosphate (FLEET) 7-19 GM/118ML enema 1 enema  1 enema Rectal Once PRN Kristeen Miss, MD         Discharge Medications: Please see discharge summary for a list of discharge medications.  Relevant Imaging Results:  Relevant Lab Results:   Additional Information SS#: [id number]  Candie Chroman, LCSW

## 2017-03-19 NOTE — Progress Notes (Signed)
RN notified CSW of PT/OT new recommendations for SNF. Evelena Peat, CSW will f/u with patient and insurance. RN will continue to monitor patient.

## 2017-03-19 NOTE — Progress Notes (Signed)
   03/19/17 2000  Incision (Closed) 03/17/17 Back Other (Comment)  Date First Assessed/Time First Assessed: 03/17/17 1800   Location: Back  Location Orientation: Other (Comment)  Dressing Type Honeycomb  Dressing New drainage  Site / Wound Assessment Bleeding (approximately 1-2 cm at bottom of incision)  Closure Skin glue  Drainage Amount Moderate  Drainage Description Serous  Treatment Cleansed;Other (Comment) (honeycomb replaced)   Patient encouraged to call for assistance when ambulating and changing positions. RN will continue to monitor patient.

## 2017-03-20 ENCOUNTER — Other Ambulatory Visit: Payer: Self-pay | Admitting: Endocrinology

## 2017-03-20 ENCOUNTER — Ambulatory Visit: Payer: Medicare Other | Admitting: Endocrinology

## 2017-03-20 LAB — GLUCOSE, CAPILLARY
GLUCOSE-CAPILLARY: 156 mg/dL — AB (ref 65–99)
GLUCOSE-CAPILLARY: 114 mg/dL — AB (ref 65–99)
GLUCOSE-CAPILLARY: 121 mg/dL — AB (ref 65–99)
Glucose-Capillary: 92 mg/dL (ref 65–99)

## 2017-03-20 MED ORDER — MAGNESIUM HYDROXIDE 400 MG/5ML PO SUSP: 30.0000 mL | Freq: Once | ORAL | Status: DC | PRN

## 2017-03-20 MED ORDER — MAGNESIUM HYDROXIDE 400 MG/5ML PO SUSP
30.0000 mL | Freq: Every day | ORAL | Status: DC | PRN
  Administered 2017-03-20: 30 mL via ORAL
  Filled 2017-03-20: qty 30

## 2017-03-20 MED ORDER — SACCHAROMYCES BOULARDII 250 MG PO CAPS
250.0000 mg | ORAL_CAPSULE | Freq: Two times a day (BID) | ORAL | Status: DC
  Administered 2017-03-20 – 2017-03-21 (×3): 250 mg via ORAL
  Filled 2017-03-20 (×3): qty 1

## 2017-03-20 MED ORDER — BISACODYL 10 MG RE SUPP
10.0000 mg | Freq: Once | RECTAL | Status: AC
  Administered 2017-03-20: 10 mg via RECTAL
  Filled 2017-03-20: qty 1

## 2017-03-20 NOTE — Progress Notes (Signed)
PT Cancellation Note  Patient Details Name: [name] MRN: [id number] DOB: 03/14/1941   Cancelled Treatment:    Reason Eval/Treat Not Completed: Fatigue/lethargy limiting ability to participate.  Attempted to see patient x2 - politely declined.  Had just ambulated with nursing and just returned to bed from chair and too fatigued.  Will return tomorrow for PT session.   Despina Pole 03/20/2017, 8:48 PM Carita Pian. Sanjuana Kava, [place] Pager [phone]

## 2017-03-20 NOTE — Clinical Social Work Placement (Signed)
   CLINICAL SOCIAL WORK PLACEMENT  NOTE  Date:  03/20/2017  Patient Details  Name: ALEXANDR OEHLER MRN: [id number] Date of Birth: Apr 20, 1941  Clinical Social Work is seeking post-discharge placement for this patient at the [place] level of care (*CSW will initial, date and re-position this form in  chart as items are completed):  Yes   Patient/family provided with [place] Work Department's list of facilities offering this level of care within the geographic area requested by the patient (or if unable, by the patient's family).  Yes   Patient/family informed of their freedom to choose among providers that offer the needed level of care, that participate in Medicare, Medicaid or managed care program needed by the patient, have an available bed and are willing to accept the patient.  Yes   Patient/family informed of [place]'s ownership interest in [facility] and [facility], as well as of the fact that they are under no obligation to receive care at these facilities.  PASRR submitted to EDS on 03/19/17     PASRR number received on 03/19/17     Existing PASRR number confirmed on       FL2 transmitted to all facilities in geographic area requested by pt/family on 03/19/17     FL2 transmitted to all facilities within larger geographic area on       Patient informed that his/her managed care company has contracts with or will negotiate with certain facilities, including the following:        Yes   Patient/family informed of bed offers received.  Patient chooses bed at [facility]     Physician recommends and patient chooses bed at      Patient to be transferred to [facility] on 03/20/17.  Patient to be transferred to facility by Patient sister     Patient family notified on 03/20/17 of transfer.  Name of family member notified:  Patient sister at bedside     PHYSICIAN Please prepare priority  discharge summary, including medications     Additional Comment:   Barbette Or, [place]

## 2017-03-20 NOTE — Clinical Social Work Note (Signed)
Clinical Social Work Assessment  Patient Details  Name: [name] MRN: [id number] Date of Birth: [birth year]  Date of referral:  03/20/17               Reason for consult:  Facility Placement                Permission sought to share information with:  Family Supports Permission granted to share information::  Yes, Verbal Permission Granted  Name::     Salvadore Farber  Agency::     Relationship::  Sister  Contact Information:  [phone]  Housing/Transportation Living arrangements for the past 2 months:  Single Family Home Source of Information:  Patient Patient Interpreter Needed:  None Criminal Activity/Legal Involvement Pertinent to Current Situation/Hospitalization:  No - Comment as needed Significant Relationships:  Siblings Lives with:  Self Do you feel safe going back to the place where you live?  Yes Need for family participation in patient care:  Yes (Comment)  Care giving concerns:  No family available at bedside, however patient states that there are no concerns at this time.   Social Worker assessment / plan:  Holiday representative met with patient at bedside to offer support and discuss patient needs at discharge.  Patient states that she has been to inpatient rehab in the past and is agreeable with SNF placement at discharge.  Patient has preference to [place] has completed FL2 and initiated referral.  CSW to follow up with patient with available bed offers.  CSW remains available for support and to facilitate patient discharge needs once medically ready.  Employment status:  Retired Forensic scientist:  Medicare PT Recommendations:  [place] / Referral to community resources:  [place]  Patient/Family's Response to care:  Patient verbalized understanding of CSW role and appreciation for support and concern.  Patient is agreeable with SNF placement and feels that sister will provide  transportation.  Patient/Family's Understanding of and Emotional Response to Diagnosis, Current Treatment, and Prognosis:  Patient with understanding about current limitations and need for continued therapies at discharge.  Patient appropriate and agreeable with current discharge plan.  Emotional Assessment Appearance:  Appears younger than stated age Attitude/Demeanor/Rapport:  Other(Engaged / Appropriate) Affect (typically observed):  Accepting, Appropriate, Calm, Pleasant Orientation:  Oriented to Self, Oriented to Situation, Oriented to Place, Oriented to  Time Alcohol / Substance use:  Not Applicable Psych involvement (Current and /or in the community):  No (Comment)  Discharge Needs  Concerns to be addressed:  No discharge needs identified Readmission within the last 30 days:  No Current discharge risk:  Lives alone Barriers to Discharge:  No Barriers Identified  Barbette Or, [place]

## 2017-03-20 NOTE — Progress Notes (Signed)
Patient tolerating pain well today, ambulated in hall x2 and in room several times; up in chair multiple times. Ready for pending discharge tomorrow. Continue to monitor patient.

## 2017-03-20 NOTE — Progress Notes (Signed)
OT Cancellation Note  Patient Details Name: [name] MRN: [id number] DOB: June 08, 1940   Cancelled Treatment:    Reason Eval/Treat Not Completed: Patient declined, no reason specified. Pt reports she just walked with RN, just sat down to eat lunch. Plans to d/c to SNF today. Will follow up for OT treatment as time allows.  Binnie Kand M.S., OTR/L Pager: [phone]  03/20/2017, 1:58 PM

## 2017-03-20 NOTE — Progress Notes (Signed)
Inpatient Diabetes Program Recommendations  AACE/ADA: New Consensus Statement on Inpatient Glycemic Control (2015)  Target Ranges:  Prepandial:   less than 140 mg/dL      Peak postprandial:   less than 180 mg/dL (1-2 hours)      Critically ill patients:  140 - 180 mg/dL   Lab Results  Component Value Date   GLUCAP 156 (H) 03/20/2017   HGBA1C 6.5 (H) 03/12/2017    Review of Glycemic ControlResults for AMYIA, LODWICK (MRN [id number]) as of 03/20/2017 11:01  Ref. Range 03/19/2017 07:46 03/19/2017 11:17 03/19/2017 17:48 03/19/2017 21:33 03/20/2017 07:55  Glucose-Capillary Latest Ref Range: 65 - 99 mg/dL 145 (H) 206 (H) 206 (H) 92 156 (H)   Diabetes history: DM 2 Outpatient Diabetes medications: Lantus 24 units, Novolog 6 units breakfast, 10 units lunch, 9 units supper Current orders for Inpatient glycemic control: Lantus 24 units, Novolog 6 units breakfast, 10 units lunch, 9 units supper  Inpatient Diabetes Program Recommendations:    Please restart Novolog sensitive tid with meals and HS.  Will continue to follow.  Thanks Adah Perl, RN, BC-ADM Inpatient Diabetes Coordinator Pager [phone] (8a-5p)

## 2017-03-20 NOTE — Clinical Social Work Note (Signed)
[facility] admissions paperwork completed with patient and faxed to the facility.  Dayton Scrape, [place]

## 2017-03-21 DIAGNOSIS — E1140 Type 2 diabetes mellitus with diabetic neuropathy, unspecified: Secondary | ICD-10-CM | POA: Diagnosis not present

## 2017-03-21 DIAGNOSIS — T8189XA Other complications of procedures, not elsewhere classified, initial encounter: Secondary | ICD-10-CM | POA: Diagnosis not present

## 2017-03-21 DIAGNOSIS — Z794 Long term (current) use of insulin: Secondary | ICD-10-CM | POA: Diagnosis not present

## 2017-03-21 DIAGNOSIS — I10 Essential (primary) hypertension: Secondary | ICD-10-CM | POA: Diagnosis not present

## 2017-03-21 DIAGNOSIS — D62 Acute posthemorrhagic anemia: Secondary | ICD-10-CM | POA: Diagnosis not present

## 2017-03-21 DIAGNOSIS — M4316 Spondylolisthesis, lumbar region: Secondary | ICD-10-CM | POA: Diagnosis not present

## 2017-03-21 DIAGNOSIS — E034 Atrophy of thyroid (acquired): Secondary | ICD-10-CM | POA: Diagnosis not present

## 2017-03-21 DIAGNOSIS — J4540 Moderate persistent asthma, uncomplicated: Secondary | ICD-10-CM | POA: Diagnosis not present

## 2017-03-21 DIAGNOSIS — M48061 Spinal stenosis, lumbar region without neurogenic claudication: Secondary | ICD-10-CM | POA: Diagnosis not present

## 2017-03-21 DIAGNOSIS — M48062 Spinal stenosis, lumbar region with neurogenic claudication: Secondary | ICD-10-CM | POA: Diagnosis not present

## 2017-03-21 DIAGNOSIS — E119 Type 2 diabetes mellitus without complications: Secondary | ICD-10-CM | POA: Diagnosis not present

## 2017-03-21 DIAGNOSIS — M797 Fibromyalgia: Secondary | ICD-10-CM | POA: Diagnosis not present

## 2017-03-21 DIAGNOSIS — M5416 Radiculopathy, lumbar region: Secondary | ICD-10-CM | POA: Diagnosis not present

## 2017-03-21 DIAGNOSIS — M6281 Muscle weakness (generalized): Secondary | ICD-10-CM | POA: Diagnosis not present

## 2017-03-21 DIAGNOSIS — R2689 Other abnormalities of gait and mobility: Secondary | ICD-10-CM | POA: Diagnosis not present

## 2017-03-21 DIAGNOSIS — Z4789 Encounter for other orthopedic aftercare: Secondary | ICD-10-CM | POA: Diagnosis not present

## 2017-03-21 DIAGNOSIS — E0842 Diabetes mellitus due to underlying condition with diabetic polyneuropathy: Secondary | ICD-10-CM | POA: Diagnosis not present

## 2017-03-21 DIAGNOSIS — E785 Hyperlipidemia, unspecified: Secondary | ICD-10-CM | POA: Diagnosis not present

## 2017-03-21 DIAGNOSIS — M47816 Spondylosis without myelopathy or radiculopathy, lumbar region: Secondary | ICD-10-CM | POA: Diagnosis not present

## 2017-03-21 DIAGNOSIS — R41841 Cognitive communication deficit: Secondary | ICD-10-CM | POA: Diagnosis not present

## 2017-03-21 DIAGNOSIS — Z981 Arthrodesis status: Secondary | ICD-10-CM | POA: Diagnosis not present

## 2017-03-21 LAB — GLUCOSE, CAPILLARY: GLUCOSE-CAPILLARY: 85 mg/dL (ref 65–99)

## 2017-03-21 MED ORDER — HYDROCODONE-ACETAMINOPHEN 5-325 MG PO TABS: 1.0000 | ORAL_TABLET | ORAL | 0 refills | Status: DC | PRN

## 2017-03-21 MED ORDER — METHOCARBAMOL 500 MG PO TABS: 500.0000 mg | ORAL_TABLET | Freq: Four times a day (QID) | ORAL | 3 refills | Status: DC | PRN

## 2017-03-21 NOTE — Progress Notes (Signed)
Patient and patient sister provided update on discharge process. Evelena Peat, CSW getting confirmation from [facility] and will contact nursing staff when patient may leave with family for transport. RN will continue to monitor and provide patient updates.

## 2017-03-21 NOTE — Clinical Social Work Note (Signed)
Clinical Social Worker facilitated patient discharge including contacting patient family and facility to confirm patient discharge plans.  Clinical information faxed to facility and family agreeable with plan.  CSW arranged transport with patient sister to [employer].  RN to call report prior to discharge.  Clinical Social Worker will sign off for now as social work intervention is no longer needed. Please consult [place] again if new need arises.  Barbette Or, [place]

## 2017-03-21 NOTE — Progress Notes (Signed)
Physical Therapy Treatment Patient Details Name: [name] MRN: [id number] DOB: Dec 20, 1940 Today's Date: 03/21/2017    History of Present Illness 76 yo admitted for T10-L2 PLIF. PMHx: prior fusions from L2-S1, DM, fibromyalgia    PT Comments    Pt progressing well. Plan for d/c to [facility] today for further therapy prior to d/c home.    Follow Up Recommendations  SNF     Equipment Recommendations  None recommended by PT    Recommendations for Other Services       Precautions / Restrictions Precautions Precautions: Back Precaution Comments: Reviewed 3/3 back precautions. Required Braces or Orthoses: Spinal Brace Spinal Brace: Applied in sitting position Restrictions Weight Bearing Restrictions: No    Mobility  Bed Mobility               General bed mobility comments: Pt received in recliner.  Transfers   Equipment used: Rolling walker (2 wheeled)   Sit to Stand: Min guard         General transfer comment: Pt demo good technique. Increased time and effort.   Ambulation/Gait Ambulation/Gait assistance: Min guard Ambulation Distance (Feet): 225 Feet Assistive device: Rolling walker (2 wheeled) Gait Pattern/deviations: Step-through pattern;Decreased stride length Gait velocity: decreased Gait velocity interpretation: Below normal speed for age/gender General Gait Details: slow, guarded gait   Stairs            Wheelchair Mobility    Modified Rankin (Stroke Patients Only)       Balance   Sitting-balance support: No upper extremity supported;Feet supported Sitting balance-Leahy Scale: Good     Standing balance support: Bilateral upper extremity supported Standing balance-Leahy Scale: Fair Standing balance comment: RW needed for ambulation                             Cognition Arousal/Alertness: Awake/alert Behavior During Therapy: WFL for tasks assessed/performed Overall Cognitive Status: Within Functional  Limits for tasks assessed                                        Exercises      General Comments        Pertinent Vitals/Pain Pain Assessment: 0-10 Pain Score: 8  Pain Location: back Pain Descriptors / Indicators: Constant Pain Intervention(s): Premedicated before session;Monitored during session    Home Living                      Prior Function            PT Goals (current goals can now be found in the care plan section) Acute Rehab PT Goals Patient Stated Goal: decrease pain PT Goal Formulation: With patient/family Time For Goal Achievement: 04/01/17 Potential to Achieve Goals: Fair Progress towards PT goals: Progressing toward goals    Frequency    Min 5X/week      PT Plan Current plan remains appropriate    Co-evaluation              AM-PAC PT "6 Clicks" Daily Activity  Outcome Measure  Difficulty turning over in bed (including adjusting bedclothes, sheets and blankets)?: A Lot Difficulty moving from lying on back to sitting on the side of the bed? : Unable Difficulty sitting down on and standing up from a chair with arms (e.g., wheelchair, bedside commode, etc,.)?: A Lot Help  needed moving to and from a bed to chair (including a wheelchair)?: A Little Help needed walking in hospital room?: A Little Help needed climbing 3-5 steps with a railing? : A Little 6 Click Score: 14    End of Session Equipment Utilized During Treatment: Gait belt;Back brace Activity Tolerance: Patient tolerated treatment well Patient left: in chair;with call bell/phone within reach Nurse Communication: Mobility status PT Visit Diagnosis: Other abnormalities of gait and mobility (R26.89);Muscle weakness (generalized) (M62.81)     Time: 1638-4665 PT Time Calculation (min) (ACUTE ONLY): 23 min  Charges:  $Gait Training: 23-37 mins                    G Codes:       Lorrin Goodell, PT  Office # [phone] Pager [phone]    Lorriane Shire 03/21/2017, 10:19 AM

## 2017-03-21 NOTE — Clinical Social Work Placement (Signed)
   CLINICAL SOCIAL WORK PLACEMENT  NOTE  Date:  03/21/2017  Patient Details  Name: [name] MRN: [id number] Date of Birth: 10-23-1940  Clinical Social Work is seeking post-discharge placement for this patient at the [place] level of care (*CSW will initial, date and re-position this form in  chart as items are completed):  Yes   Patient/family provided with [place] Work Department's list of facilities offering this level of care within the geographic area requested by the patient (or if unable, by the patient's family).  Yes   Patient/family informed of their freedom to choose among providers that offer the needed level of care, that participate in Medicare, Medicaid or managed care program needed by the patient, have an available bed and are willing to accept the patient.  Yes   Patient/family informed of [facility]'s ownership interest in [facility] and [facility], as well as of the fact that they are under no obligation to receive care at these facilities.  PASRR submitted to EDS on 03/19/17     PASRR number received on 03/19/17     Existing PASRR number confirmed on       FL2 transmitted to all facilities in geographic area requested by pt/family on 03/19/17     FL2 transmitted to all facilities within larger geographic area on       Patient informed that his/her managed care company has contracts with or will negotiate with certain facilities, including the following:        Yes   Patient/family informed of bed offers received.  Patient chooses bed at [facility]     Physician recommends and patient chooses bed at      Patient to be transferred to [facility] on 03/21/17.  Patient to be transferred to facility by Patient sister     Patient family notified on 03/21/17 of transfer.  Name of family member notified:  Patient sister at bedside     PHYSICIAN Please prepare priority  discharge summary, including medications     Additional Comment:   Barbette Or, [place]

## 2017-03-21 NOTE — Progress Notes (Signed)
NURSING PROGRESS NOTE  FLOIS MCTAGUE [id number] Discharge Data: 03/21/2017 12:37 PM Attending Provider: Kristeen Miss, MD ERD:[id number], Denton Ar, MD     [name] to be D/C'd to [address] [facility]  per MD order.  Discussed with the patient the After Visit Summary and all questions fully answered. All IV's discontinued with no bleeding noted. All belongings returned to patient for patient to take home. Report was called to [employer]. Pt was sent with the printed prescriptions for hydrocodone/APAP and robaxin.   Last Vital Signs:  Blood pressure (!) 108/58, pulse 76, temperature 98.1 F (36.7 C), temperature source Oral, resp. rate 20, height 5\' 5"  (1.651 m), weight 82.6 kg (182 lb 1.6 oz), SpO2 100 %.  Discharge Medication List Allergies as of 03/21/2017      Reactions   Cymbalta [duloxetine Hcl] Diarrhea, Other (See Comments)   Dizziness, headache, irritability   Baclofen    jerks    Betadine [povidone Iodine] Swelling, Other (See Comments)   SWELLING REACTION DESCRIPTION/SEVERITY UNSPECIFIED  Reaction to betadine eye drops   Adhesive [tape] Rash   Lipitor [atorvastatin] Rash      Medication List    TAKE these medications   acetaminophen 500 MG tablet Commonly known as:  TYLENOL Take 500-1,000 mg every 8 (eight) hours as needed by mouth for mild pain or moderate pain.   aspirin 81 MG EC tablet Take 81mg  by mouth once daily   CAL-MAG-ZINC PO Take 1 tablet by mouth daily.   cetirizine 10 MG tablet Commonly known as:  ZYRTEC Take 10 mg by mouth daily.   diphenhydramine-acetaminophen 25-500 MG Tabs tablet Commonly known as:  TYLENOL PM Take 1 tablet by mouth at bedtime as needed.   ezetimibe 10 MG tablet Commonly known as:  ZETIA TAKE 1 TABLET(10 MG) BY MOUTH DAILY   fluticasone 50 MCG/ACT nasal spray Commonly known as:  FLONASE Place 1 spray into both nostrils daily.   Fluticasone Furoate 100 MCG/ACT Aepb Commonly known as:  ARNUITY ELLIPTA Inhale 1  puff into the lungs daily.   gabapentin 300 MG capsule Commonly known as:  NEURONTIN TAKE 1 CAPSULE(300 MG) BY MOUTH THREE TIMES DAILY What changed:    how much to take  how to take this  when to take this  additional instructions   glucose blood test strip Commonly known as:  ONE TOUCH ULTRA TEST Use to check blood sugars 4 times daily dx code: E11.9   HYDROcodone-acetaminophen 5-325 MG tablet Commonly known as:  NORCO/VICODIN Take 1 tablet every 6 (six) hours as needed by mouth for moderate pain. What changed:  Another medication with the same name was added. Make sure you understand how and when to take each.   HYDROcodone-acetaminophen 5-325 MG tablet Commonly known as:  NORCO/VICODIN Take 1-2 tablets by mouth every 4 (four) hours as needed for severe pain ((score 7 to 10)). What changed:  You were already taking a medication with the same name, and this prescription was added. Make sure you understand how and when to take each.   ibuprofen 800 MG tablet Commonly known as:  ADVIL,MOTRIN Take 800 mg every 8 (eight) hours as needed by mouth for mild pain or moderate pain.   insulin degludec 100 UNIT/ML Sopn FlexTouch Pen Commonly known as:  TRESIBA FLEXTOUCH Use 24 units daily as discussed. What changed:    how much to take  how to take this  when to take this  additional instructions   Insulin Pen Needle  32G X 4 MM Misc Commonly known as:  BD PEN NEEDLE NANO U/F USE AS DIRECTED   BD PEN NEEDLE NANO U/F 32G X 4 MM Misc Generic drug:  Insulin Pen Needle USE AS DIRECTED   irbesartan-hydrochlorothiazide 300-12.5 MG tablet Commonly known as:  AVALIDE Take 1 tablet by mouth daily.   levothyroxine 25 MCG tablet Commonly known as:  SYNTHROID, LEVOTHROID TAKE 1 TABLET(25 MCG) BY MOUTH DAILY BEFORE BREAKFAST   magnesium gluconate 500 MG tablet Commonly known as:  MAGONATE Take 500 mg by mouth 2 (two) times daily.   methocarbamol 500 MG tablet Commonly known  as:  ROBAXIN Take 1 tablet (500 mg total) by mouth every 6 (six) hours as needed for muscle spasms.   NOVOLOG FLEXPEN 100 UNIT/ML FlexPen Generic drug:  insulin aspart INJECT 6 UNITS UNDER THE SKIN BEFORE BREAKFAST, 10 UNITS BEFORE LUNCH, AND 9 UNITS BEFORE SUPPER   potassium chloride 10 MEQ tablet Commonly known as:  K-DUR,KLOR-CON TAKE 2 TABLETS(20 MEQ) BY MOUTH TWICE DAILY What changed:  See the new instructions.   PROAIR RESPICLICK 681 (90 Base) MCG/ACT Aepb Generic drug:  Albuterol Sulfate Inhale 2 puffs into the lungs 3 (three) times daily as needed (shortness of breath).   pyridoxine 100 MG tablet Commonly known as:  B-6 Take 100 mg daily by mouth.   SYMBICORT 80-4.5 MCG/ACT inhaler Generic drug:  budesonide-formoterol Inhale 2 puffs into the lungs 2 (two) times daily as needed (shortness of breath).   vitamin B-12 100 MCG tablet Commonly known as:  CYANOCOBALAMIN Take 100 mcg daily by mouth.   vitamin E 400 UNIT capsule Take 400 Units by mouth daily.

## 2017-03-21 NOTE — Discharge Summary (Signed)
Date of admission: 03/17/2017 Date of discharge: 03/21/2017 Admitting diagnosis: Spondylosis and stenosis L1-L2 status post decompression fusion from L2 to the sacrum neurogenic claudication, lumbar radiculopathy Discharge and final diagnosis: Spondylosis and stenosis L1-L2, status post decompression from L2 to the sacrum, neurogenic claudication, lumbar stenosis, acute blood loss anemia. Condition on discharge: Improved Hospital course: Patient was admitted to undergo surgical decompression and stabilization at the L1-L2 level with fixation up to T10. The patient tolerated surgery well however she did have an acute blood loss anemia with hemoglobin decreasing to 9.8. She did not require transfusion. Pain management was assessed gradually came under reasonable control of these of hydrocodone. She had significant constipation ultimately bowels move done day 3 postop. She is being transferred to skilled nursing facility to undergo further intensive inpatient rehabilitation prior to being discharged home. Patient's sister is in attendance during this hospitalization. Diabetes has been managed in diabetic corner has adjusted her dosing appropriately. She she is given a prescription for hydrocodone and methocarbamol at time of discharge.

## 2017-03-24 ENCOUNTER — Non-Acute Institutional Stay (SKILLED_NURSING_FACILITY): Payer: Medicare Other | Admitting: Internal Medicine

## 2017-03-24 ENCOUNTER — Encounter: Payer: Self-pay | Admitting: Internal Medicine

## 2017-03-24 DIAGNOSIS — M4316 Spondylolisthesis, lumbar region: Secondary | ICD-10-CM

## 2017-03-24 DIAGNOSIS — Z794 Long term (current) use of insulin: Secondary | ICD-10-CM

## 2017-03-24 DIAGNOSIS — E1140 Type 2 diabetes mellitus with diabetic neuropathy, unspecified: Secondary | ICD-10-CM | POA: Diagnosis not present

## 2017-03-24 DIAGNOSIS — E034 Atrophy of thyroid (acquired): Secondary | ICD-10-CM | POA: Diagnosis not present

## 2017-03-24 DIAGNOSIS — E785 Hyperlipidemia, unspecified: Secondary | ICD-10-CM

## 2017-03-24 DIAGNOSIS — M48062 Spinal stenosis, lumbar region with neurogenic claudication: Secondary | ICD-10-CM

## 2017-03-24 DIAGNOSIS — E0842 Diabetes mellitus due to underlying condition with diabetic polyneuropathy: Secondary | ICD-10-CM | POA: Diagnosis not present

## 2017-03-24 DIAGNOSIS — I10 Essential (primary) hypertension: Secondary | ICD-10-CM

## 2017-03-24 DIAGNOSIS — J4540 Moderate persistent asthma, uncomplicated: Secondary | ICD-10-CM

## 2017-03-24 DIAGNOSIS — D62 Acute posthemorrhagic anemia: Secondary | ICD-10-CM | POA: Diagnosis not present

## 2017-03-24 NOTE — Progress Notes (Signed)
: Provider:  Noah Delaine. Sheppard Coil, MD Location:  [place] Room Number: 381 Place of Service:  SNF [phone])  PCP: Wenda Low, MD Patient Care Team: Wenda Low, MD as PCP - General (Internal Medicine) Kristeen Miss, MD as Consulting Physician (Neurosurgery)  Extended Emergency Contact Information Primary Emergency Contact: Patricia,Montgomery Address: [place], [place] [address] Johnnette Litter of [place] Phone: [phone] Relation: Sister Secondary Emergency Contact: Helane Rima [place] of [place] Phone: [phone] Relation: Sister     Allergies: Cymbalta [duloxetine hcl]; Baclofen; Betadine [povidone iodine]; Adhesive [tape]; and Lipitor [atorvastatin]  Chief Complaint  Patient presents with  . New Admit To SNF    following hospitalization 03/17/17 to 03/21/17 spondylosis and steosis L1-L2    HPI: Patient is 76 y.o. female diabetes type 2, fibromyalgia and chronic low back pain, status post several surgeries in the past, who was admitted to [facility] from 11/26-30 for a planned surgical decompression and stabilization at L1-L2 level with fixation up to L 10. Patient tolerated surgery well but did have an acute blood loss anemia with hemoglobin decreasing to 9.8. She did not require transfusion. Pain management was a problem but gradually came under reasonable control with use of hydrocodone. Patient is admitted to skilled nursing facility for OT/PT. While at skilled nursing facility patient will be followed for diabetes mellitus 2 treated with insulin, hypertension treated with Avalide, and hypothyroidism treated with replacement.  Past Medical History:  Diagnosis Date  . Acute blood loss anemia   . Acute encephalopathy 07/15/2016  . AKI (acute kidney injury) ([place])   . Anemia    has sickle cell trait  . Anxiety   . Arthritis   . Asthma    has used inhaler in past for asthmatic bronchitis, last  time- early 2012  . Bilateral primary osteoarthritis of knee 03/26/2016  . Complication of anesthesia    wakes up shaking  . Diabetes mellitus   . Diabetes mellitus with neuropathy ([place]) 10/29/2012  . Diverticulitis   . Dyslipidemia   . Encephalopathy 06/2016   due to medications after surgery  . Fibromyalgia   . GERD (gastroesophageal reflux disease)    occas. use of  Prilosec  . Heart murmur    sees Dr. Montez Morita, last seen- early 2012  . Herniated nucleus pulposus, L2-3 06/25/2016  . History of back surgery   . Hypertension    02/2010- stress test /w PCP  . Hypothyroidism   . Loose bowel movements 12/2016  . Lumbar radiculopathy 06/27/2016  . Lumbar stenosis with neurogenic claudication 03/17/2017  . Memory disorder 02/16/2016  . Myoclonic jerking   . Neuromuscular disorder (H[facility])    lumbar radiculopathy, lumbago  . Nocturnal leg cramps 09/27/2014  . Osteoporosis 03/10/2014  . Pneumonia   . Sickle cell trait ([place])   . Sleep apnea    borderline sleep apnea, states she no longer uses, early 2012- stopped using   . Spinal stenosis of lumbar region with radiculopathy 02/28/2014  . Spondylolisthesis of lumbar region 03/19/2011  . Type II or unspecified type diabetes mellitus without mention of complication, uncontrolled 07/08/2013    Past Surgical History:  Procedure Laterality Date  . ABDOMINAL HYSTERECTOMY    . adb.cyst     ovarian cyst  . BACK SURGERY     2012, 2015 (3 total)  . COLONOSCOPY    . EYE SURGERY  macular degeneration treatment - injections  . OVARIAN CYST SURGERY    . POSTERIOR LUMBAR FUSION 4 LEVEL N/A 03/17/2017   Procedure: Decompression of Lumbar One-Two with Thoracic Ten to Lumbar Two Fusion;  Surgeon: Kristeen Miss, MD;  Location: [facility];  Service: Neurosurgery;  Laterality: N/A;  Decompression of L1-2 with T10 to L2 Fusion    Allergies as of 03/24/2017      Reactions   Cymbalta [duloxetine Hcl] Diarrhea, Other (See Comments)   Dizziness, headache,  irritability   Baclofen    jerks    Betadine [povidone Iodine] Swelling, Other (See Comments)   SWELLING REACTION DESCRIPTION/SEVERITY UNSPECIFIED  Reaction to betadine eye drops   Adhesive [tape] Rash   Lipitor [atorvastatin] Rash      Medication List        Accurate as of 03/24/17 10:16 PM. Always use your most recent med list.          acetaminophen 500 MG tablet Commonly known as:  TYLENOL Take 1,000 mg by mouth every 8 (eight) hours as needed for mild pain or moderate pain.   aspirin 81 MG EC tablet Take 47m by mouth once daily   CAL-MAG-ZINC PO Take 1 tablet by mouth daily.   cetirizine 10 MG tablet Commonly known as:  ZYRTEC Take 10 mg by mouth daily.   ezetimibe 10 MG tablet Commonly known as:  ZETIA TAKE 1 TABLET(10 MG) BY MOUTH DAILY   fluticasone 50 MCG/ACT nasal spray Commonly known as:  FLONASE Place 1 spray into both nostrils daily.   Fluticasone Furoate 100 MCG/ACT Aepb Commonly known as:  ARNUITY ELLIPTA Inhale 1 puff into the lungs daily.   gabapentin 300 MG capsule Commonly known as:  NEURONTIN TAKE 1 CAPSULE(300 MG) BY MOUTH THREE TIMES DAILY   HYDROcodone-acetaminophen 5-325 MG tablet Commonly known as:  NORCO/VICODIN Take 1 tablet every 6 (six) hours as needed by mouth for moderate pain.   HYDROcodone-acetaminophen 5-325 MG tablet Commonly known as:  NORCO/VICODIN Take 1-2 tablets by mouth every 4 (four) hours as needed for severe pain ((score 7 to 10)).   ibuprofen 800 MG tablet Commonly known as:  ADVIL,MOTRIN Take 800 mg every 8 (eight) hours as needed by mouth for mild pain or moderate pain.   irbesartan-hydrochlorothiazide 300-12.5 MG tablet Commonly known as:  AVALIDE Take 1 tablet by mouth daily.   levothyroxine 25 MCG tablet Commonly known as:  SYNTHROID, LEVOTHROID TAKE 1 TABLET(25 MCG) BY MOUTH DAILY BEFORE BREAKFAST   magnesium gluconate 500 MG tablet Commonly known as:  MAGONATE Take 500 mg by mouth 2 (two) times  daily.   methocarbamol 500 MG tablet Commonly known as:  ROBAXIN Take 1 tablet (500 mg total) by mouth every 6 (six) hours as needed for muscle spasms.   NOVOLOG FLEXPEN 100 UNIT/ML FlexPen Generic drug:  insulin aspart INJECT 6 UNITS UNDER THE SKIN BEFORE BREAKFAST, 10 UNITS BEFORE LUNCH, AND 9 UNITS BEFORE SUPPER   potassium chloride 10 MEQ tablet Commonly known as:  K-DUR,KLOR-CON TAKE 2 TABLETS(20 MEQ) BY MOUTH TWICE DAILY   PROAIR RESPICLICK 1491(90 Base) MCG/ACT Aepb Generic drug:  Albuterol Sulfate Inhale 2 puffs into the lungs 3 (three) times daily as needed (shortness of breath).   pyridoxine 100 MG tablet Commonly known as:  B-6 Take 100 mg daily by mouth.   SYMBICORT 80-4.5 MCG/ACT inhaler Generic drug:  budesonide-formoterol Inhale 2 puffs into the lungs 2 (two) times daily as needed (shortness of breath).   TRESIBA FLEXTOUCH 100  UNIT/ML Sopn FlexTouch Pen Generic drug:  insulin degludec Inject into the skin. Inject 24 units daily for DM   vitamin B-12 100 MCG tablet Commonly known as:  CYANOCOBALAMIN Take 100 mcg daily by mouth.   vitamin E 400 UNIT capsule Take 400 Units by mouth daily.       No orders of the defined types were placed in this encounter.   Immunization History  Administered Date(s) Administered  . Influenza,inj,Quad PF,6+ Mos 02/18/2013, 02/09/2014  . Influenza-Unspecified 01/16/2015  . Pneumococcal Conjugate-13 08/29/2014  . Pneumococcal Polysaccharide-23 04/23/2005    Social History   Tobacco Use  . Smoking status: Former Smoker    Last attempt to quit: 03/15/1999    Years since quitting: 18.0  . Smokeless tobacco: Never Used  Substance Use Topics  . Alcohol use: Yes    Comment: wine /w dinner on occas.    Family history is   Family History  Problem Relation Age of Onset  . Ovarian cancer Mother   . Cancer - Prostate Father   . Breast cancer Paternal Aunt   . Multiple myeloma Paternal Aunt   . Anesthesia problems  Neg Hx   . Hypotension Neg Hx   . Malignant hyperthermia Neg Hx   . Pseudochol deficiency Neg Hx       Review of Systems  DATA OBTAINED: from patient, nurse- per nursing pt was screaming for her pain meds-2 norco q 4 GENERAL:  no fevers, fatigue, appetite changes SKIN: No itching, or rash EYES: No eye pain, redness, discharge EARS: No earache, tinnitus, change in hearing NOSE: No congestion, drainage or bleeding  MOUTH/THROAT: No mouth or tooth pain, No sore throat RESPIRATORY: No cough, wheezing, SOB CARDIAC: No chest pain, palpitations, lower extremity edema  GI: No abdominal pain, No N/V/D or constipation, No heartburn or reflux  GU: No dysuria, frequency or urgency, or incontinence  MUSCULOSKELETAL: Low back pain NEUROLOGIC: No headache, dizziness or focal weakness PSYCHIATRIC: No c/o anxiety or sadness   Vitals:   03/24/17 2202  BP: 125/67  Pulse: 84  Resp: 16  Temp: 98.1 F (36.7 C)  SpO2: 98%    SpO2 Readings from Last 1 Encounters:  03/24/17 98%   Body mass index is 30.29 kg/m.     Physical Exam  GENERAL APPEARANCE: Alert, conversant,  No acute distress.  SKIN: No diaphoresis rash HEAD: Normocephalic, atraumatic  EYES: Conjunctiva/lids clear. Pupils round, reactive. EOMs intact.  EARS: External exam WNL, canals clear. Hearing grossly normal.  NOSE: No deformity or discharge.  MOUTH/THROAT: Lips w/o lesions  RESPIRATORY: Breathing is even, unlabored. Lung sounds are clear   CARDIOVASCULAR: Heart RRR no murmurs, rubs or gallops. 1+ peripheral edema.   GASTROINTESTINAL: Abdomen is soft, non-tender, not distended w/ normal bowel sounds. GENITOURINARY: Bladder non tender, not distended  MUSCULOSKELETAL: wearing back brace NEUROLOGIC:  Cranial nerves 2-12 grossly intact. Moves all extremities  PSYCHIATRIC: Mood and affect appropriate to situation, no behavioral issues  Patient Active Problem List   Diagnosis Date Noted  . Lumbar stenosis with neurogenic  claudication 03/17/2017  . Spinal stenosis of lumbar region 03/03/2017  . Myoclonic jerking   . Nausea   . Acute encephalopathy 07/15/2016  . Lumbar radiculopathy 06/27/2016  . Hypothyroidism 06/26/2016  . Labile blood glucose   . Surgery, elective   . Post-operative pain   . Sickle cell trait ([place])   . Acute blood loss anemia   . History of back surgery   . AKI (acute kidney injury) (  [place])   . Herniated nucleus pulposus, L2-3 06/25/2016  . Bilateral primary osteoarthritis of knee 03/26/2016  . Osteoarthritis, hand 03/26/2016  . Other insomnia 03/26/2016  . Memory disorder 02/16/2016  . Fibromyalgia 09/27/2014  . Nocturnal leg cramps 09/27/2014  . Body mass index (BMI) of 30.0-30.9 in adult 08/04/2014  . Neuropathy 03/10/2014  . Allergic rhinitis 03/10/2014  . Osteoporosis 03/10/2014  . Spinal stenosis of lumbar region with radiculopathy 02/28/2014  . Type II or unspecified type diabetes mellitus without mention of complication, uncontrolled 07/08/2013  . Hypokalemia 11/02/2012  . Essential hypertension, benign 11/02/2012  . Diabetes mellitus with neuropathy ([place]) 10/29/2012  . Hyperlipidemia 10/29/2012  . Spondylolisthesis of lumbar region 03/19/2011  . Lumbar radicular pain 03/19/2011      Labs reviewed: Basic Metabolic Panel:    Component Value Date/Time   NA 138 03/18/2017 0448   K 4.2 03/18/2017 0448   CL 103 03/18/2017 0448   CO2 27 03/18/2017 0448   GLUCOSE 171 (H) 03/18/2017 0448   BUN 12 03/18/2017 0448   CREATININE 0.96 03/18/2017 0448   CREATININE 0.91 07/05/2013 1014   CALCIUM 8.3 (L) 03/18/2017 0448   PROT 7.0 12/18/2016 1022   ALBUMIN 4.2 12/18/2016 1022   AST 18 12/18/2016 1022   ALT 12 12/18/2016 1022   ALKPHOS 72 12/18/2016 1022   BILITOT 0.4 12/18/2016 1022   GFRNONAA 56 (L) 03/18/2017 0448   GFRAA >60 03/18/2017 0448    Recent Labs    01/13/17 1153 03/12/17 1413 03/18/17 0448  NA 143 140 138  K 3.7 4.0 4.2  CL 104 105 103  CO2 32  27 27  GLUCOSE 133* 82 171*  BUN _0 CREATININE 0.86 0.92 0.96  CALCIUM 9.7 9.6 8.3*   Liver Function Tests: Recent Labs    07/17/16 1634 08/09/16 1103 12/18/16 1022  AST _1 ALT _2 ALKPHOS 83 76 72  BILITOT 0.4 0.5 0.4  PROT 6.5 7.3 7.0  ALBUMIN 3.7 4.5 4.2   No results for input(s): LIPASE, AMYLASE in the last 8760 hours. Recent Labs    07/15/16 0142 07/17/16 1634  AMMONIA 27 31   CBC: Recent Labs    07/13/16 0912 07/14/16 2208 07/15/16 0323  03/12/17 1413 03/18/17 0448 03/19/17 0416  WBC 7.2 7.3 6.9   < > 6.7 9.0 9.3  NEUTROABS 5.4 4.6 5.1  --   --   --   --   HGB 9.8* 9.8* 9.3*   < > 12.8 9.9* 9.1*  HCT 30.7* 30.6* 29.1*   < > 39.2 30.2* 28.3*  MCV 84.8 85.0 84.8   < > 83.8 83.4 84.5  PLT 332 317 325   < > 227 202 193   < > = values in this interval not displayed.   Lipid Recent Labs    05/24/16 1019  CHOL 227*  HDL 44.70  LDLCALC 155*  TRIG 137.0    Cardiac Enzymes: No results for input(s): CKTOTAL, CKMB, CKMBINDEX, TROPONINI in the last 8760 hours. BNP: No results for input(s): BNP in the last 8760 hours. Lab Results  Component Value Date   MICROALBUR <0.7 01/13/2017   Lab Results  Component Value Date   HGBA1C 6.5 (H) 03/12/2017   Lab Results  Component Value Date   TSH 3.41 12/18/2016   Lab Results  Component Value Date   VITAMINB12 1,280 (H) 02/16/2016   No results found for: FOLATE No results found for: IRON, TIBC, FERRITIN  Imaging and Procedures obtained prior to SNF admission: No results found.   Not all labs, radiology exams or other studies done during hospitalization come through on my EPIC note; however they are reviewed by me.    Assessment and Plan  SPONDYLOSIS AND STENOSIS L1-L2,/NEUROGENIC CLAUDICATION/LUMBAR STENOSIS-status post decompression from L1 to sacrum with fixation up to T10; patient tolerated surgery well; patient management hydrocodone 5/325 one to 2 every 4 SNF - patient is  admitted for OT/PT; continue ASA 81 mg by mouth daily; patient is not happy with her pain meds; nurses report she has been literally screaming at them to bring her to [place] as every 4 hours  ACUTE BLOOD LOSS ANEMIA postop-DC hemoglobin 9.8 SNF - will follow-up CBC  DIABETES TYPE II-hemoglobin A1c 2 weeks ago 6.5, good control SNF - plan to continue triseba flex touch, which is a Lantus insulin, 24 units subcutaneous daily and NovoLog insulin 6 units before breakfast, 10 units before lunch and 9 units before supper; patient is on ARB and ZETIA  HYPERTENSION SNF - controlled plan to continue Avalide 300-12 0.5 mg 1 by mouth daily  HYPOTHYROIDISM SNF - controlled; continue Synthroid 25 g by mouth daily  HYPERLIPIDEMIA SNF - controlled; continue Zetia 10 mg by mouth daily  POLYNEUROPATHY SNF - continue Neurontin 300 mg by mouth 3 times a day  ASTHMA SNF - plan to continue our numerous E Liptak 1 puff into the lungs daily along with Flonase spray daily and when necessary Pro Air MDI and Symbicort MDI when necessary   Time spent greater than 45 minutes ;> 50% of time with patient was spent reviewing records, labs, tests and studies, counseling and developing plan of care  Webb Silversmith D. Sheppard Coil, MD

## 2017-03-24 NOTE — Progress Notes (Signed)
Opened in error

## 2017-03-24 NOTE — Patient Instructions (Signed)
Opened in error

## 2017-03-25 ENCOUNTER — Other Ambulatory Visit: Payer: Self-pay | Admitting: *Deleted

## 2017-03-25 ENCOUNTER — Non-Acute Institutional Stay (SKILLED_NURSING_FACILITY): Payer: Medicare Other | Admitting: Internal Medicine

## 2017-03-25 ENCOUNTER — Encounter: Payer: Self-pay | Admitting: Internal Medicine

## 2017-03-25 DIAGNOSIS — T8189XA Other complications of procedures, not elsewhere classified, initial encounter: Secondary | ICD-10-CM

## 2017-03-25 NOTE — Progress Notes (Signed)
Location:  [facility] Room Number: [place]:  [id number] [phone])  Provider: Noah Delaine. Sheppard Coil, MD  Wenda Low, MD  Patient Care Team: Wenda Low, MD as PCP - General (Internal Medicine) Kristeen Miss, MD as Consulting Physician (Neurosurgery)  Extended Emergency Contact Information Primary Emergency Contact: Patricia,Montgomery Address: [place],  [address] [address] of [place] Phone: [phone] Relation: Sister Secondary Emergency Contact: Helane Rima [place] of [place] Phone: [phone] Relation: Sister    Allergies: Cymbalta [duloxetine hcl]; Baclofen; Betadine [povidone iodine]; Adhesive [tape]; and Lipitor [atorvastatin]  Chief Complaint  Patient presents with  . Acute Visit    wound check    HPI: Patient is 76 y.o. female who wound nurse asked me to see for possible incision infection. She noted notes this morning, as she was changing the dressing. Patient wishes confirmation from the neurosurgeon to be able to shower. Patient denies any more pain than usual to incision site.  Past Medical History:  Diagnosis Date  . Acute blood loss anemia   . Acute encephalopathy 07/15/2016  . AKI (acute kidney injury) ([place])   . Anemia    has sickle cell trait  . Anxiety   . Arthritis   . Asthma    has used inhaler in past for asthmatic bronchitis, last time- early 2012  . Bilateral primary osteoarthritis of knee 03/26/2016  . Complication of anesthesia    wakes up shaking  . Diabetes mellitus   . Diabetes mellitus with neuropathy ([place]) 10/29/2012  . Diverticulitis   . Dyslipidemia   . Encephalopathy 06/2016   due to medications after surgery  . Fibromyalgia   . GERD (gastroesophageal reflux disease)    occas. use of  Prilosec  . Heart murmur    sees Dr. Montez Morita, last seen- early 2012  . Herniated nucleus pulposus, L2-3 06/25/2016  . History of back surgery   . Hypertension    02/2010- stress test /w PCP  . Hypothyroidism   . Loose bowel movements 12/2016  . Lumbar radiculopathy 06/27/2016  . Lumbar stenosis with neurogenic claudication 03/17/2017  . Memory disorder 02/16/2016  . Myoclonic jerking   . Neuromuscular disorder (H[facility])    lumbar radiculopathy, lumbago  . Nocturnal leg cramps 09/27/2014  . Osteoporosis 03/10/2014  . Pneumonia   . Sickle cell trait ([place])   . Sleep apnea    borderline sleep apnea, states she no longer uses, early 2012- stopped using   . Spinal stenosis of lumbar region with radiculopathy 02/28/2014  . Spondylolisthesis of lumbar region 03/19/2011  . Type II or unspecified type diabetes mellitus without mention of complication, uncontrolled 07/08/2013    Past Surgical History:  Procedure Laterality Date  . ABDOMINAL HYSTERECTOMY    . adb.cyst     ovarian cyst  . BACK SURGERY     2012, 2015 (3 total)  . COLONOSCOPY    . EYE SURGERY     macular degeneration treatment - injections  . OVARIAN CYST SURGERY    . POSTERIOR LUMBAR FUSION 4 LEVEL N/A 03/17/2017   Procedure: Decompression of Lumbar One-Two with Thoracic Ten to Lumbar Two Fusion;  Surgeon: Kristeen Miss, MD;  Location: [facility];  Service: Neurosurgery;  Laterality: N/A;  Decompression of L1-2 with T10 to L2 Fusion    Allergies as of 03/25/2017      Reactions   Cymbalta [duloxetine Hcl] Diarrhea, Other (See Comments)  Dizziness, headache, irritability   Baclofen    jerks    Betadine [povidone Iodine] Swelling, Other (See Comments)   SWELLING REACTION DESCRIPTION/SEVERITY UNSPECIFIED  Reaction to betadine eye drops   Adhesive [tape] Rash   Lipitor [atorvastatin] Rash      Medication List        Accurate as of 03/25/17  4:38 PM. Always use your most recent med list.          acetaminophen 500 MG tablet Commonly known as:  TYLENOL Take 1,000 mg by mouth every 8 (eight) hours as needed for mild pain or moderate pain.   aspirin 81 MG EC tablet Take 81mg  by mouth  once daily   CAL-MAG-ZINC PO Take 1 tablet by mouth daily.   cetirizine 10 MG tablet Commonly known as:  ZYRTEC Take 10 mg by mouth daily.   ezetimibe 10 MG tablet Commonly known as:  ZETIA TAKE 1 TABLET(10 MG) BY MOUTH DAILY   fluticasone 50 MCG/ACT nasal spray Commonly known as:  FLONASE Place 1 spray into both nostrils daily.   Fluticasone Furoate 100 MCG/ACT Aepb Commonly known as:  ARNUITY ELLIPTA Inhale 1 puff into the lungs daily.   gabapentin 300 MG capsule Commonly known as:  NEURONTIN TAKE 1 CAPSULE(300 MG) BY MOUTH THREE TIMES DAILY   HYDROcodone-acetaminophen 10-325 MG tablet Commonly known as:  NORCO Take 1 tablet by mouth. Take one tablet every 4 hours as needed for severe pain   HYDROcodone-acetaminophen 5-325 MG tablet Commonly known as:  NORCO/VICODIN Take 1 tablet by mouth. Take one tablet every 6 hours as needed for moderate pain   ibuprofen 800 MG tablet Commonly known as:  ADVIL,MOTRIN Take 800 mg every 8 (eight) hours as needed by mouth for mild pain or moderate pain.   levothyroxine 25 MCG tablet Commonly known as:  SYNTHROID, LEVOTHROID TAKE 1 TABLET(25 MCG) BY MOUTH DAILY BEFORE BREAKFAST   magnesium gluconate 500 MG tablet Commonly known as:  MAGONATE Take 500 mg by mouth 2 (two) times daily.   methocarbamol 500 MG tablet Commonly known as:  ROBAXIN Take 1 tablet (500 mg total) by mouth every 6 (six) hours as needed for muscle spasms.   NOVOLOG FLEXPEN 100 UNIT/ML FlexPen Generic drug:  insulin aspart INJECT 6 UNITS UNDER THE SKIN BEFORE BREAKFAST, 10 UNITS BEFORE LUNCH, AND 9 UNITS BEFORE SUPPER   potassium chloride 10 MEQ tablet Commonly known as:  K-DUR,KLOR-CON TAKE 2 TABLETS(20 MEQ) BY MOUTH TWICE DAILY   PROAIR RESPICLICK 631 (90 Base) MCG/ACT Aepb Generic drug:  Albuterol Sulfate Inhale 2 puffs into the lungs 3 (three) times daily as needed (shortness of breath).   pyridoxine 100 MG tablet Commonly known as:  B-6 Take 100  mg daily by mouth.   SYMBICORT 80-4.5 MCG/ACT inhaler Generic drug:  budesonide-formoterol Inhale 2 puffs into the lungs 2 (two) times daily as needed (shortness of breath).   TRESIBA FLEXTOUCH 100 UNIT/ML Sopn FlexTouch Pen Generic drug:  insulin degludec Inject into the skin. Inject 24 units daily for DM   vitamin B-12 100 MCG tablet Commonly known as:  CYANOCOBALAMIN Take 100 mcg daily by mouth.   vitamin E 400 UNIT capsule Take 400 Units by mouth daily.       No orders of the defined types were placed in this encounter.   Immunization History  Administered Date(s) Administered  . Influenza,inj,Quad PF,6+ Mos 02/18/2013, 02/09/2014  . Influenza-Unspecified 01/16/2015  . Pneumococcal Conjugate-13 08/29/2014  . Pneumococcal Polysaccharide-23 04/23/2005  Social History   Tobacco Use  . Smoking status: Former Smoker    Last attempt to quit: 03/15/1999    Years since quitting: 18.0  . Smokeless tobacco: Never Used  Substance Use Topics  . Alcohol use: Yes    Comment: wine /w dinner on occas.    Review of Systems  DATA OBTAINED: from patient, nurse GENERAL:  no fevers, fatigue, appetite changes SKIN: No itching, rash HEENT: No complaint RESPIRATORY: No cough, wheezing, SOB CARDIAC: No chest pain, palpitations, lower extremity edema  GI: No abdominal pain, No N/V/D or constipation, No heartburn or reflux  GU: No dysuria, frequency or urgency, or incontinence  MUSCULOSKELETAL: No unrelieved bone/joint pain NEUROLOGIC: No headache, dizziness  PSYCHIATRIC: No overt anxiety or sadness  Vitals:   03/25/17 1623  BP: 109/60  Pulse: 81  Resp: 19  Temp: 98 F (36.7 C)  SpO2: 98%   Body mass index is 29.12 kg/m. Physical Exam  GENERAL APPEARANCE: Alert, conversant, No acute distress  SKIN: Incision was clean except for minimal redness around  incision with minimal swelling, no heat; there is one area that looks like a flat lab that may be fluid-filled but  again there is no swelling or heat so I don't think it is pus HEENT: Unremarkable RESPIRATORY: Breathing is even, unlabored. Lung sounds are clear   CARDIOVASCULAR: Heart RRR no murmurs, rubs or gallops. No peripheral edema  GASTROINTESTINAL: Abdomen is soft, non-tender, not distended w/ normal bowel sounds.  GENITOURINARY: Bladder non tender, not distended  MUSCULOSKELETAL: No abnormal joints or musculature except postsurgical NEUROLOGIC: Cranial nerves 2-12 grossly intact. Moves all extremities PSYCHIATRIC: Mood and affect appropriate to situation, no behavioral issues  Patient Active Problem List   Diagnosis Date Noted  . Lumbar stenosis with neurogenic claudication 03/17/2017  . Spinal stenosis of lumbar region 03/03/2017  . Myoclonic jerking   . Nausea   . Acute encephalopathy 07/15/2016  . Lumbar radiculopathy 06/27/2016  . Hypothyroidism 06/26/2016  . Labile blood glucose   . Surgery, elective   . Post-operative pain   . Sickle cell trait ([place])   . Acute blood loss anemia   . History of back surgery   . AKI (acute kidney injury) ([place])   . Herniated nucleus pulposus, L2-3 06/25/2016  . Bilateral primary osteoarthritis of knee 03/26/2016  . Osteoarthritis, hand 03/26/2016  . Other insomnia 03/26/2016  . Memory disorder 02/16/2016  . Fibromyalgia 09/27/2014  . Nocturnal leg cramps 09/27/2014  . Body mass index (BMI) of 30.0-30.9 in adult 08/04/2014  . Neuropathy 03/10/2014  . Allergic rhinitis 03/10/2014  . Osteoporosis 03/10/2014  . Spinal stenosis of lumbar region with radiculopathy 02/28/2014  . Type II or unspecified type diabetes mellitus without mention of complication, uncontrolled 07/08/2013  . Hypokalemia 11/02/2012  . Essential hypertension, benign 11/02/2012  . Diabetes mellitus with neuropathy ([place]) 10/29/2012  . Hyperlipidemia 10/29/2012  . Spondylolisthesis of lumbar region 03/19/2011  . Lumbar radicular pain 03/19/2011    CMP     Component Value  Date/Time   NA 138 03/18/2017 0448   K 4.2 03/18/2017 0448   CL 103 03/18/2017 0448   CO2 27 03/18/2017 0448   GLUCOSE 171 (H) 03/18/2017 0448   BUN 12 03/18/2017 0448   CREATININE 0.96 03/18/2017 0448   CREATININE 0.91 07/05/2013 1014   CALCIUM 8.3 (L) 03/18/2017 0448   PROT 7.0 12/18/2016 1022   ALBUMIN 4.2 12/18/2016 1022   AST 18 12/18/2016 1022   ALT 12 12/18/2016 1022  ALKPHOS 72 12/18/2016 1022   BILITOT 0.4 12/18/2016 1022   GFRNONAA 56 (L) 03/18/2017 0448   GFRAA >60 03/18/2017 0448   Recent Labs    01/13/17 1153 03/12/17 1413 03/18/17 0448  NA 143 140 138  K 3.7 4.0 4.2  CL 104 105 103  CO2 32 27 27  GLUCOSE 133* 82 171*  BUN 15 15 12   CREATININE 0.86 0.92 0.96  CALCIUM 9.7 9.6 8.3*   Recent Labs    07/17/16 1634 08/09/16 1103 12/18/16 1022  AST 19 17 18   ALT 16 11 12   ALKPHOS 83 76 72  BILITOT 0.4 0.5 0.4  PROT 6.5 7.3 7.0  ALBUMIN 3.7 4.5 4.2   Recent Labs    07/13/16 0912 07/14/16 2208 07/15/16 0323  03/12/17 1413 03/18/17 0448 03/19/17 0416  WBC 7.2 7.3 6.9   < > 6.7 9.0 9.3  NEUTROABS 5.4 4.6 5.1  --   --   --   --   HGB 9.8* 9.8* 9.3*   < > 12.8 9.9* 9.1*  HCT 30.7* 30.6* 29.1*   < > 39.2 30.2* 28.3*  MCV 84.8 85.0 84.8   < > 83.8 83.4 84.5  PLT 332 317 325   < > 227 202 193   < > = values in this interval not displayed.   Recent Labs    05/24/16 1019  CHOL 227*  LDLCALC 155*  TRIG 137.0   Lab Results  Component Value Date   MICROALBUR <0.7 01/13/2017   Lab Results  Component Value Date   TSH 3.41 12/18/2016   Lab Results  Component Value Date   HGBA1C 6.5 (H) 03/12/2017   Lab Results  Component Value Date   CHOL 227 (H) 05/24/2016   HDL 44.70 05/24/2016   LDLCALC 155 (H) 05/24/2016   LDLDIRECT 115.0 12/18/2016   TRIG 137.0 05/24/2016   CHOLHDL 5 05/24/2016    Significant Diagnostic Results in last 30 days:  Dg Lumbar Spine 2-3 Views  Result Date: 03/17/2017 CLINICAL DATA:  76 y/o F; intraoperative  fluoroscopy of lumbar depression. EXAM: DG C-ARM 61-120 MIN; LUMBAR SPINE - 2-3 VIEW COMPARISON:  02/18/2017 lumbar spine MRI. FINDINGS: Anterior and lateral intraoperative fluoroscopic images demonstrating posterior instrumentation fusion hardware from T10 to below the field of view and lumbar spine as well as L1-2 and L2-3 interbody fusion and laminectomy. Fluoro time 54 seconds. IMPRESSION: Intraoperative fluoroscopy of thoracolumbar fusion. Fluoro time 54 seconds. Electronically Signed   By: Kristine Garbe M.D.   On: 03/17/2017 20:02   Dg C-arm 61-120 Min  Result Date: 03/17/2017 CLINICAL DATA:  76 y/o F; intraoperative fluoroscopy of lumbar depression. EXAM: DG C-ARM 61-120 MIN; LUMBAR SPINE - 2-3 VIEW COMPARISON:  02/18/2017 lumbar spine MRI. FINDINGS: Anterior and lateral intraoperative fluoroscopic images demonstrating posterior instrumentation fusion hardware from T10 to below the field of view and lumbar spine as well as L1-2 and L2-3 interbody fusion and laminectomy. Fluoro time 54 seconds. IMPRESSION: Intraoperative fluoroscopy of thoracolumbar fusion. Fluoro time 54 seconds. Electronically Signed   By: Kristine Garbe M.D.   On: 03/17/2017 20:02    Assessment and Plan  Redness to incision site-with 1 small area that looks like a fluid-filled bleb; do not think it is pus because there is no surrounding swelling or heat; patient is able to shower today, warm water on incision area will be very good if there is any kind of early infection; do not think there is any need for antibiotic at this  time; we will monitor    Webb Silversmith D. Sheppard Coil, MD

## 2017-03-25 NOTE — Patient Outreach (Signed)
[facility]) Care Management  03/25/2017  [name] Mar 25, 1941 [id number]   Met with patient at bedside of facility.  Patient reports when she goes home she will have her sister to assist her.  Patient reports she has heard of [facility] care management services in the past.  She reports she does not have any issues with medications or transportation.  She does report she has diabetes but denies any issues with management.   RNCM reviewed [facility] program. Patient accepted information but declines services today.   Plan to sign off at this time. Royetta Crochet. Laymond Purser, RN, BSN, [place] [phone]) Business Cell  [phone]) Toll Free Office

## 2017-03-26 ENCOUNTER — Encounter: Payer: Self-pay | Admitting: Internal Medicine

## 2017-03-26 ENCOUNTER — Non-Acute Institutional Stay (SKILLED_NURSING_FACILITY): Payer: Medicare Other | Admitting: Internal Medicine

## 2017-03-26 DIAGNOSIS — I10 Essential (primary) hypertension: Secondary | ICD-10-CM | POA: Diagnosis not present

## 2017-03-26 DIAGNOSIS — M48061 Spinal stenosis, lumbar region without neurogenic claudication: Secondary | ICD-10-CM

## 2017-03-26 DIAGNOSIS — M797 Fibromyalgia: Secondary | ICD-10-CM | POA: Diagnosis not present

## 2017-03-26 DIAGNOSIS — M48062 Spinal stenosis, lumbar region with neurogenic claudication: Secondary | ICD-10-CM

## 2017-03-26 DIAGNOSIS — E785 Hyperlipidemia, unspecified: Secondary | ICD-10-CM | POA: Diagnosis not present

## 2017-03-26 DIAGNOSIS — M5416 Radiculopathy, lumbar region: Secondary | ICD-10-CM

## 2017-03-26 DIAGNOSIS — E034 Atrophy of thyroid (acquired): Secondary | ICD-10-CM | POA: Diagnosis not present

## 2017-03-26 DIAGNOSIS — E1140 Type 2 diabetes mellitus with diabetic neuropathy, unspecified: Secondary | ICD-10-CM

## 2017-03-26 DIAGNOSIS — Z794 Long term (current) use of insulin: Secondary | ICD-10-CM | POA: Diagnosis not present

## 2017-03-26 DIAGNOSIS — D62 Acute posthemorrhagic anemia: Secondary | ICD-10-CM | POA: Diagnosis not present

## 2017-03-26 NOTE — Progress Notes (Signed)
Location:  [place] Room Number: [place]:  SNF [phone])  Provider: Noah Delaine. Sheppard Coil, MD  PCP: Wenda Low, MD Patient Care Team: Wenda Low, MD as PCP - General (Internal Medicine) Kristeen Miss, MD as Consulting Physician (Neurosurgery)  Extended Emergency Contact Information Primary Emergency Contact: Patricia,Montgomery Address: [place], [place] [address] [address]nette [address]itter of [place] Phone: [phone] Relation: Sister Secondary Emergency Contact: Helane Rima [place] [employer] Phone: [phone] Relation: Sister  Allergies  Allergen Reactions  . Cymbalta [Duloxetine Hcl] Diarrhea and Other (See Comments)    Dizziness, headache, irritability  . Baclofen     jerks   . Betadine [Povidone Iodine] Swelling and Other (See Comments)    SWELLING REACTION DESCRIPTION/SEVERITY UNSPECIFIED  Reaction to betadine eye drops  . Adhesive [Tape] Rash  . Lipitor [Atorvastatin] Rash    Chief Complaint  Patient presents with  . Discharge Note    discharge from SNF to home    HPI:  76 y.o. female  diabetes, fibromyalgia, hypertension, hyperlipidemia, who was admitted to [facility] from 11/26-30 to undergo surgical decompression and stabilization at the L1-L2 level with fixation up to T10. The patient tolerated surgery well but did have an acute blood loss anemia with hemoglobin decreasing to 9.8. She doesn't require transfusion. She was admitted to skilled nursing facility for OT/PT. Patient is now ready to be discharged to home.    Past Medical History:  Diagnosis Date  . Acute blood loss anemia   . Acute encephalopathy 07/15/2016  . AKI (acute kidney injury) ([place])   . Anemia    has sickle cell trait  . Anxiety   . Arthritis   . Asthma    has used inhaler in past for asthmatic bronchitis, last time- early 2012  . Bilateral primary osteoarthritis of knee 03/26/2016  . Complication of  anesthesia    wakes up shaking  . Diabetes mellitus   . Diabetes mellitus with neuropathy ([place]) 10/29/2012  . Diverticulitis   . Dyslipidemia   . Encephalopathy 06/2016   due to medications after surgery  . Fibromyalgia   . GERD (gastroesophageal reflux disease)    occas. use of  Prilosec  . Heart murmur    sees Dr. Montez Morita, last seen- early 2012  . Herniated nucleus pulposus, L2-3 06/25/2016  . History of back surgery   . Hypertension    02/2010- stress test /w PCP  . Hypothyroidism   . Loose bowel movements 12/2016  . Lumbar radiculopathy 06/27/2016  . Lumbar stenosis with neurogenic claudication 03/17/2017  . Memory disorder 02/16/2016  . Myoclonic jerking   . Neuromuscular disorder (HCC)    lumbar radiculopathy, lumbago  . Nocturnal leg cramps 09/27/2014  . Osteoporosis 03/10/2014  . Pneumonia   . Sickle cell trait ([place])   . Sleep apnea    borderline sleep apnea, states she no longer uses, early 2012- stopped using   . Spinal stenosis of lumbar region with radiculopathy 02/28/2014  . Spondylolisthesis of lumbar region 03/19/2011  . Type II or unspecified type diabetes mellitus without mention of complication, uncontrolled 07/08/2013    Past Surgical History:  Procedure Laterality Date  . ABDOMINAL HYSTERECTOMY    . adb.cyst     ovarian cyst  . BACK SURGERY     2012, 2015 (3 total)  . COLONOSCOPY    . EYE SURGERY     macular  degeneration treatment - injections  . OVARIAN CYST SURGERY    . POSTERIOR LUMBAR FUSION 4 LEVEL N/A 03/17/2017   Procedure: Decompression of Lumbar One-Two with Thoracic Ten to Lumbar Two Fusion;  Surgeon: Kristeen Miss, MD;  Location: [place];  Service: Neurosurgery;  Laterality: N/A;  Decompression of L1-2 with T10 to L2 Fusion     reports that she quit smoking about 18 years ago. she has never used smokeless tobacco. She reports that she drinks alcohol. She reports that she does not use drugs. Social History   Socioeconomic History  . Marital  status: Divorced    Spouse name: Not on file  . Number of children: 1  . Years of education: Designer, jewellery  . Highest education level: Not on file  Social Needs  . Financial resource strain: Not on file  . Food insecurity - worry: Not on file  . Food insecurity - inability: Not on file  . Transportation needs - medical: Not on file  . Transportation needs - non-medical: Not on file  Occupational History  . Occupation: Retired  Tobacco Use  . Smoking status: Former Smoker    Last attempt to quit: 03/15/1999    Years since quitting: 18.0  . Smokeless tobacco: Never Used  Substance and Sexual Activity  . Alcohol use: Yes    Comment: wine /w dinner on occas.  . Drug use: No  . Sexual activity: No  Other Topics Concern  . Not on file  Social History Narrative   Patient drinks caffeine occasionally.   Patient is right handed.   Admitted to [facility] 03/21/17   Divorced    Former smoker - stopped 2000    Alcohol - occasionally wine at dinner   Full code    Pertinent  Health Maintenance Due  Topic Date Due  . OPHTHALMOLOGY EXAM  12/16/1949  . DEXA SCAN  12/16/2004  . FOOT EXAM  05/03/2015  . INFLUENZA VACCINE  11/20/2016  . HEMOGLOBIN A1C  09/09/2017  . URINE MICROALBUMIN  01/13/2018  . PNA vac Low Risk Adult  Completed    Medications: Allergies as of 03/26/2017      Reactions   Cymbalta [duloxetine Hcl] Diarrhea, Other (See Comments)   Dizziness, headache, irritability   Baclofen    jerks    Betadine [povidone Iodine] Swelling, Other (See Comments)   SWELLING REACTION DESCRIPTION/SEVERITY UNSPECIFIED  Reaction to betadine eye drops   Adhesive [tape] Rash   Lipitor [atorvastatin] Rash      Medication List        Accurate as of 03/26/17  5:36 PM. Always use your most recent med list.          acetaminophen 500 MG tablet Commonly known as:  TYLENOL Take 1,000 mg by mouth every 8 (eight) hours as needed for mild pain or moderate pain.   aspirin  81 MG EC tablet Take 81mg  by mouth once daily   CAL-MAG-ZINC PO Take 1 tablet by mouth daily.   cetirizine 10 MG tablet Commonly known as:  ZYRTEC Take 10 mg by mouth daily.   ezetimibe 10 MG tablet Commonly known as:  ZETIA TAKE 1 TABLET(10 MG) BY MOUTH DAILY   fluticasone 50 MCG/ACT nasal spray Commonly known as:  FLONASE Place 1 spray into both nostrils daily.   Fluticasone Furoate 100 MCG/ACT Aepb Commonly known as:  ARNUITY ELLIPTA Inhale 1 puff into the lungs daily.   gabapentin 300 MG capsule Commonly known as:  NEURONTIN TAKE  1 CAPSULE(300 MG) BY MOUTH THREE TIMES DAILY   HYDROcodone-acetaminophen 10-325 MG tablet Commonly known as:  NORCO Take 1 tablet by mouth. Take one tablet every 4 hours as needed for severe pain   HYDROcodone-acetaminophen 5-325 MG tablet Commonly known as:  NORCO/VICODIN Take 1 tablet by mouth. Take one tablet every 6 hours as needed for moderate pain   ibuprofen 800 MG tablet Commonly known as:  ADVIL,MOTRIN Take 800 mg every 8 (eight) hours as needed by mouth for mild pain or moderate pain.   levothyroxine 25 MCG tablet Commonly known as:  SYNTHROID, LEVOTHROID TAKE 1 TABLET(25 MCG) BY MOUTH DAILY BEFORE BREAKFAST   magnesium gluconate 500 MG tablet Commonly known as:  MAGONATE Take 500 mg by mouth 2 (two) times daily.   methocarbamol 500 MG tablet Commonly known as:  ROBAXIN Take 1 tablet (500 mg total) by mouth every 6 (six) hours as needed for muscle spasms.   NOVOLOG FLEXPEN 100 UNIT/ML FlexPen Generic drug:  insulin aspart INJECT 6 UNITS UNDER THE SKIN BEFORE BREAKFAST, 10 UNITS BEFORE LUNCH, AND 9 UNITS BEFORE SUPPER   potassium chloride 10 MEQ tablet Commonly known as:  K-DUR,KLOR-CON TAKE 2 TABLETS(20 MEQ) BY MOUTH TWICE DAILY   PROAIR RESPICLICK 379 (90 Base) MCG/ACT Aepb Generic drug:  Albuterol Sulfate Inhale 2 puffs into the lungs 3 (three) times daily as needed (shortness of breath).   pyridoxine 100 MG  tablet Commonly known as:  B-6 Take 100 mg daily by mouth.   SYMBICORT 80-4.5 MCG/ACT inhaler Generic drug:  budesonide-formoterol Inhale 2 puffs into the lungs 2 (two) times daily as needed (shortness of breath).   TRESIBA FLEXTOUCH 100 UNIT/ML Sopn FlexTouch Pen Generic drug:  insulin degludec Inject into the skin. Inject 24 units daily for DM   vitamin B-12 100 MCG tablet Commonly known as:  CYANOCOBALAMIN Take 100 mcg daily by mouth.   vitamin E 400 UNIT capsule Take 400 Units by mouth daily.        Vitals:   03/26/17 1003  BP: 130/72  Pulse: (!) 55  Resp: 20  Temp: 98.7 F (37.1 C)  SpO2: 98%  Weight: 175 lb 3.2 oz (79.5 kg)  Height: 5\' 5"  (1.651 m)   Body mass index is 29.15 kg/m.  Physical Exam  GENERAL APPEARANCE: Alert, conversant. No acute distress.  HEENT: Unremarkable. RESPIRATORY: Breathing is even, unlabored. Lung sounds are clear   CARDIOVASCULAR: Heart RRR no murmurs, rubs or gallops. No peripheral edema.  GASTROINTESTINAL: Abdomen is soft, non-tender, not distended w/ normal bowel sounds.  NEUROLOGIC: Cranial nerves 2-12 grossly intact. Moves all extremities   Labs reviewed: Basic Metabolic Panel: Recent Labs    01/13/17 1153 03/12/17 1413 03/18/17 0448  NA 143 140 138  K 3.7 4.0 4.2  CL 104 105 103  CO2 32 27 27  GLUCOSE 133* 82 171*  BUN 15 15 12   CREATININE 0.86 0.92 0.96  CALCIUM 9.7 9.6 8.3*   Lab Results  Component Value Date   MICROALBUR <0.7 01/13/2017   Liver Function Tests: Recent Labs    07/17/16 1634 08/09/16 1103 12/18/16 1022  AST 19 17 18   ALT 16 11 12   ALKPHOS 83 76 72  BILITOT 0.4 0.5 0.4  PROT 6.5 7.3 7.0  ALBUMIN 3.7 4.5 4.2   No results for input(s): LIPASE, AMYLASE in the last 8760 hours. Recent Labs    07/15/16 0142 07/17/16 1634  AMMONIA 27 31   CBC: Recent Labs    07/13/16 0912  07/14/16 2208 07/15/16 0323  03/12/17 1413 03/18/17 0448 03/19/17 0416  WBC 7.2 7.3 6.9   < > 6.7 9.0 9.3   NEUTROABS 5.4 4.6 5.1  --   --   --   --   HGB 9.8* 9.8* 9.3*   < > 12.8 9.9* 9.1*  HCT 30.7* 30.6* 29.1*   < > 39.2 30.2* 28.3*  MCV 84.8 85.0 84.8   < > 83.8 83.4 84.5  PLT 332 317 325   < > 227 202 193   < > = values in this interval not displayed.   Lipid Recent Labs    05/24/16 1019  CHOL 227*  HDL 44.70  LDLCALC 155*  TRIG 137.0   Cardiac Enzymes: No results for input(s): CKTOTAL, CKMB, CKMBINDEX, TROPONINI in the last 8760 hours. BNP: No results for input(s): BNP in the last 8760 hours. CBG: Recent Labs    03/20/17 1743 03/20/17 2116 03/21/17 0638  GLUCAP 121* 92 85    Procedures and Imaging Studies During Stay: Dg Lumbar Spine 2-3 Views  Result Date: 03/17/2017 CLINICAL DATA:  76 y/o F; intraoperative fluoroscopy of lumbar depression. EXAM: DG C-ARM 61-120 MIN; LUMBAR SPINE - 2-3 VIEW COMPARISON:  02/18/2017 lumbar spine MRI. FINDINGS: Anterior and lateral intraoperative fluoroscopic images demonstrating posterior instrumentation fusion hardware from T10 to below the field of view and lumbar spine as well as L1-2 and L2-3 interbody fusion and laminectomy. Fluoro time 54 seconds. IMPRESSION: Intraoperative fluoroscopy of thoracolumbar fusion. Fluoro time 54 seconds. Electronically Signed   By: Kristine Garbe M.D.   On: 03/17/2017 20:02   Dg C-arm 61-120 Min  Result Date: 03/17/2017 CLINICAL DATA:  76 y/o F; intraoperative fluoroscopy of lumbar depression. EXAM: DG C-ARM 61-120 MIN; LUMBAR SPINE - 2-3 VIEW COMPARISON:  02/18/2017 lumbar spine MRI. FINDINGS: Anterior and lateral intraoperative fluoroscopic images demonstrating posterior instrumentation fusion hardware from T10 to below the field of view and lumbar spine as well as L1-2 and L2-3 interbody fusion and laminectomy. Fluoro time 54 seconds. IMPRESSION: Intraoperative fluoroscopy of thoracolumbar fusion. Fluoro time 54 seconds. Electronically Signed   By: Kristine Garbe M.D.   On: 03/17/2017  20:02    Assessment/Plan:   Spinal stenosis of lumbar region with radiculopathy  Spinal stenosis of lumbar region with neurogenic claudication  Acute blood loss anemia  Type 2 diabetes mellitus with diabetic neuropathy, with long-term current use of insulin ([place])  Hypothyroidism due to acquired atrophy of thyroid  Essential hypertension, benign  Fibromyalgia  Dyslipidemia   Patient is being discharged with the following home health services:  OT/PT/nursing  Patient is being discharged with the following durable medical equipment:  None  Patient has been advised to f/u with their PCP in 1-2 weeks to bring them up to date on their rehab stay.  Social services at facility was responsible for arranging this appointment.  Pt was provided with a 30 day supply of prescriptions for medications and refills must be obtained from their PCP.  For controlled substances, a more limited supply may be provided adequate until PCP appointment only.  Medications have been reconciled.   Time spent greater than 30 minutes;> 50% of time with patient was spent reviewing records, labs, tests and studies, counseling and developing plan of care  Noah Delaine. Sheppard Coil, MD.

## 2017-03-28 ENCOUNTER — Other Ambulatory Visit: Payer: Self-pay | Admitting: Endocrinology

## 2017-03-29 LAB — BASIC METABOLIC PANEL
BUN: 14 (ref 4–21)
CREATININE: 0.9 (ref 0.5–1.1)
Glucose: 87
POTASSIUM: 4.6 (ref 3.4–5.3)
SODIUM: 142 (ref 137–147)

## 2017-03-29 LAB — CBC AND DIFFERENTIAL
HEMATOCRIT: 32 — AB (ref 36–46)
Hemoglobin: 9.9 — AB (ref 12.0–16.0)
Platelets: 235 (ref 150–399)
WBC: 6.2

## 2017-03-30 ENCOUNTER — Encounter: Payer: Self-pay | Admitting: Internal Medicine

## 2017-03-30 DIAGNOSIS — D62 Acute posthemorrhagic anemia: Secondary | ICD-10-CM | POA: Insufficient documentation

## 2017-03-30 DIAGNOSIS — J45909 Unspecified asthma, uncomplicated: Secondary | ICD-10-CM | POA: Insufficient documentation

## 2017-03-30 DIAGNOSIS — E0842 Diabetes mellitus due to underlying condition with diabetic polyneuropathy: Secondary | ICD-10-CM | POA: Insufficient documentation

## 2017-04-01 ENCOUNTER — Other Ambulatory Visit: Payer: Medicare Other

## 2017-04-03 ENCOUNTER — Ambulatory Visit: Payer: Medicare Other | Admitting: Endocrinology

## 2017-04-03 ENCOUNTER — Other Ambulatory Visit: Payer: Self-pay

## 2017-04-06 DIAGNOSIS — F419 Anxiety disorder, unspecified: Secondary | ICD-10-CM | POA: Diagnosis not present

## 2017-04-06 DIAGNOSIS — J45909 Unspecified asthma, uncomplicated: Secondary | ICD-10-CM | POA: Diagnosis not present

## 2017-04-06 DIAGNOSIS — Z79891 Long term (current) use of opiate analgesic: Secondary | ICD-10-CM | POA: Diagnosis not present

## 2017-04-06 DIAGNOSIS — Z4789 Encounter for other orthopedic aftercare: Secondary | ICD-10-CM | POA: Diagnosis not present

## 2017-04-06 DIAGNOSIS — D649 Anemia, unspecified: Secondary | ICD-10-CM | POA: Diagnosis not present

## 2017-04-06 DIAGNOSIS — Z87891 Personal history of nicotine dependence: Secondary | ICD-10-CM | POA: Diagnosis not present

## 2017-04-06 DIAGNOSIS — Z794 Long term (current) use of insulin: Secondary | ICD-10-CM | POA: Diagnosis not present

## 2017-04-06 DIAGNOSIS — D573 Sickle-cell trait: Secondary | ICD-10-CM | POA: Diagnosis not present

## 2017-04-06 DIAGNOSIS — I10 Essential (primary) hypertension: Secondary | ICD-10-CM | POA: Diagnosis not present

## 2017-04-06 DIAGNOSIS — M797 Fibromyalgia: Secondary | ICD-10-CM | POA: Diagnosis not present

## 2017-04-06 DIAGNOSIS — Z7982 Long term (current) use of aspirin: Secondary | ICD-10-CM | POA: Diagnosis not present

## 2017-04-06 DIAGNOSIS — M48061 Spinal stenosis, lumbar region without neurogenic claudication: Secondary | ICD-10-CM | POA: Diagnosis not present

## 2017-04-06 DIAGNOSIS — E1140 Type 2 diabetes mellitus with diabetic neuropathy, unspecified: Secondary | ICD-10-CM | POA: Diagnosis not present

## 2017-04-06 DIAGNOSIS — Z981 Arthrodesis status: Secondary | ICD-10-CM | POA: Diagnosis not present

## 2017-04-06 DIAGNOSIS — E785 Hyperlipidemia, unspecified: Secondary | ICD-10-CM | POA: Diagnosis not present

## 2017-04-06 DIAGNOSIS — M170 Bilateral primary osteoarthritis of knee: Secondary | ICD-10-CM | POA: Diagnosis not present

## 2017-04-07 DIAGNOSIS — E1142 Type 2 diabetes mellitus with diabetic polyneuropathy: Secondary | ICD-10-CM | POA: Diagnosis not present

## 2017-04-07 DIAGNOSIS — Z794 Long term (current) use of insulin: Secondary | ICD-10-CM | POA: Diagnosis not present

## 2017-04-07 DIAGNOSIS — M519 Unspecified thoracic, thoracolumbar and lumbosacral intervertebral disc disorder: Secondary | ICD-10-CM | POA: Diagnosis not present

## 2017-04-07 DIAGNOSIS — Z981 Arthrodesis status: Secondary | ICD-10-CM | POA: Diagnosis not present

## 2017-04-07 DIAGNOSIS — I10 Essential (primary) hypertension: Secondary | ICD-10-CM | POA: Diagnosis not present

## 2017-04-08 DIAGNOSIS — M170 Bilateral primary osteoarthritis of knee: Secondary | ICD-10-CM | POA: Diagnosis not present

## 2017-04-08 DIAGNOSIS — Z4789 Encounter for other orthopedic aftercare: Secondary | ICD-10-CM | POA: Diagnosis not present

## 2017-04-08 DIAGNOSIS — J45909 Unspecified asthma, uncomplicated: Secondary | ICD-10-CM | POA: Diagnosis not present

## 2017-04-08 DIAGNOSIS — E1140 Type 2 diabetes mellitus with diabetic neuropathy, unspecified: Secondary | ICD-10-CM | POA: Diagnosis not present

## 2017-04-08 DIAGNOSIS — M48061 Spinal stenosis, lumbar region without neurogenic claudication: Secondary | ICD-10-CM | POA: Diagnosis not present

## 2017-04-08 DIAGNOSIS — I10 Essential (primary) hypertension: Secondary | ICD-10-CM | POA: Diagnosis not present

## 2017-04-10 DIAGNOSIS — M170 Bilateral primary osteoarthritis of knee: Secondary | ICD-10-CM | POA: Diagnosis not present

## 2017-04-10 DIAGNOSIS — Z4789 Encounter for other orthopedic aftercare: Secondary | ICD-10-CM | POA: Diagnosis not present

## 2017-04-10 DIAGNOSIS — E1140 Type 2 diabetes mellitus with diabetic neuropathy, unspecified: Secondary | ICD-10-CM | POA: Diagnosis not present

## 2017-04-10 DIAGNOSIS — M48061 Spinal stenosis, lumbar region without neurogenic claudication: Secondary | ICD-10-CM | POA: Diagnosis not present

## 2017-04-10 DIAGNOSIS — J45909 Unspecified asthma, uncomplicated: Secondary | ICD-10-CM | POA: Diagnosis not present

## 2017-04-10 DIAGNOSIS — I10 Essential (primary) hypertension: Secondary | ICD-10-CM | POA: Diagnosis not present

## 2017-04-11 DIAGNOSIS — E1140 Type 2 diabetes mellitus with diabetic neuropathy, unspecified: Secondary | ICD-10-CM | POA: Diagnosis not present

## 2017-04-11 DIAGNOSIS — J45909 Unspecified asthma, uncomplicated: Secondary | ICD-10-CM | POA: Diagnosis not present

## 2017-04-11 DIAGNOSIS — Z4789 Encounter for other orthopedic aftercare: Secondary | ICD-10-CM | POA: Diagnosis not present

## 2017-04-11 DIAGNOSIS — M48061 Spinal stenosis, lumbar region without neurogenic claudication: Secondary | ICD-10-CM | POA: Diagnosis not present

## 2017-04-11 DIAGNOSIS — M170 Bilateral primary osteoarthritis of knee: Secondary | ICD-10-CM | POA: Diagnosis not present

## 2017-04-11 DIAGNOSIS — I10 Essential (primary) hypertension: Secondary | ICD-10-CM | POA: Diagnosis not present

## 2017-04-14 DIAGNOSIS — I10 Essential (primary) hypertension: Secondary | ICD-10-CM | POA: Diagnosis not present

## 2017-04-14 DIAGNOSIS — Z4789 Encounter for other orthopedic aftercare: Secondary | ICD-10-CM | POA: Diagnosis not present

## 2017-04-14 DIAGNOSIS — J45909 Unspecified asthma, uncomplicated: Secondary | ICD-10-CM | POA: Diagnosis not present

## 2017-04-14 DIAGNOSIS — M170 Bilateral primary osteoarthritis of knee: Secondary | ICD-10-CM | POA: Diagnosis not present

## 2017-04-14 DIAGNOSIS — M48061 Spinal stenosis, lumbar region without neurogenic claudication: Secondary | ICD-10-CM | POA: Diagnosis not present

## 2017-04-14 DIAGNOSIS — E1140 Type 2 diabetes mellitus with diabetic neuropathy, unspecified: Secondary | ICD-10-CM | POA: Diagnosis not present

## 2017-04-16 DIAGNOSIS — Z4789 Encounter for other orthopedic aftercare: Secondary | ICD-10-CM | POA: Diagnosis not present

## 2017-04-16 DIAGNOSIS — E1140 Type 2 diabetes mellitus with diabetic neuropathy, unspecified: Secondary | ICD-10-CM | POA: Diagnosis not present

## 2017-04-16 DIAGNOSIS — M48061 Spinal stenosis, lumbar region without neurogenic claudication: Secondary | ICD-10-CM | POA: Diagnosis not present

## 2017-04-16 DIAGNOSIS — M170 Bilateral primary osteoarthritis of knee: Secondary | ICD-10-CM | POA: Diagnosis not present

## 2017-04-16 DIAGNOSIS — J45909 Unspecified asthma, uncomplicated: Secondary | ICD-10-CM | POA: Diagnosis not present

## 2017-04-16 DIAGNOSIS — I10 Essential (primary) hypertension: Secondary | ICD-10-CM | POA: Diagnosis not present

## 2017-04-17 DIAGNOSIS — M48062 Spinal stenosis, lumbar region with neurogenic claudication: Secondary | ICD-10-CM | POA: Diagnosis not present

## 2017-04-18 DIAGNOSIS — E1140 Type 2 diabetes mellitus with diabetic neuropathy, unspecified: Secondary | ICD-10-CM | POA: Diagnosis not present

## 2017-04-18 DIAGNOSIS — M170 Bilateral primary osteoarthritis of knee: Secondary | ICD-10-CM | POA: Diagnosis not present

## 2017-04-18 DIAGNOSIS — I10 Essential (primary) hypertension: Secondary | ICD-10-CM | POA: Diagnosis not present

## 2017-04-18 DIAGNOSIS — J45909 Unspecified asthma, uncomplicated: Secondary | ICD-10-CM | POA: Diagnosis not present

## 2017-04-18 DIAGNOSIS — Z4789 Encounter for other orthopedic aftercare: Secondary | ICD-10-CM | POA: Diagnosis not present

## 2017-04-18 DIAGNOSIS — M48061 Spinal stenosis, lumbar region without neurogenic claudication: Secondary | ICD-10-CM | POA: Diagnosis not present

## 2017-04-21 DIAGNOSIS — I10 Essential (primary) hypertension: Secondary | ICD-10-CM | POA: Diagnosis not present

## 2017-04-21 DIAGNOSIS — J45909 Unspecified asthma, uncomplicated: Secondary | ICD-10-CM | POA: Diagnosis not present

## 2017-04-21 DIAGNOSIS — M48061 Spinal stenosis, lumbar region without neurogenic claudication: Secondary | ICD-10-CM | POA: Diagnosis not present

## 2017-04-21 DIAGNOSIS — E1140 Type 2 diabetes mellitus with diabetic neuropathy, unspecified: Secondary | ICD-10-CM | POA: Diagnosis not present

## 2017-04-21 DIAGNOSIS — M170 Bilateral primary osteoarthritis of knee: Secondary | ICD-10-CM | POA: Diagnosis not present

## 2017-04-21 DIAGNOSIS — Z4789 Encounter for other orthopedic aftercare: Secondary | ICD-10-CM | POA: Diagnosis not present

## 2017-04-23 ENCOUNTER — Other Ambulatory Visit: Payer: Self-pay | Admitting: Endocrinology

## 2017-04-23 DIAGNOSIS — M48061 Spinal stenosis, lumbar region without neurogenic claudication: Secondary | ICD-10-CM | POA: Diagnosis not present

## 2017-04-23 DIAGNOSIS — I10 Essential (primary) hypertension: Secondary | ICD-10-CM | POA: Diagnosis not present

## 2017-04-23 DIAGNOSIS — Z4789 Encounter for other orthopedic aftercare: Secondary | ICD-10-CM | POA: Diagnosis not present

## 2017-04-23 DIAGNOSIS — E1140 Type 2 diabetes mellitus with diabetic neuropathy, unspecified: Secondary | ICD-10-CM | POA: Diagnosis not present

## 2017-04-23 DIAGNOSIS — M170 Bilateral primary osteoarthritis of knee: Secondary | ICD-10-CM | POA: Diagnosis not present

## 2017-04-23 DIAGNOSIS — J45909 Unspecified asthma, uncomplicated: Secondary | ICD-10-CM | POA: Diagnosis not present

## 2017-04-24 DIAGNOSIS — M48061 Spinal stenosis, lumbar region without neurogenic claudication: Secondary | ICD-10-CM | POA: Diagnosis not present

## 2017-04-24 DIAGNOSIS — M170 Bilateral primary osteoarthritis of knee: Secondary | ICD-10-CM | POA: Diagnosis not present

## 2017-04-24 DIAGNOSIS — Z4789 Encounter for other orthopedic aftercare: Secondary | ICD-10-CM | POA: Diagnosis not present

## 2017-04-24 DIAGNOSIS — E1140 Type 2 diabetes mellitus with diabetic neuropathy, unspecified: Secondary | ICD-10-CM | POA: Diagnosis not present

## 2017-04-24 DIAGNOSIS — J45909 Unspecified asthma, uncomplicated: Secondary | ICD-10-CM | POA: Diagnosis not present

## 2017-04-24 DIAGNOSIS — I10 Essential (primary) hypertension: Secondary | ICD-10-CM | POA: Diagnosis not present

## 2017-04-29 DIAGNOSIS — E1140 Type 2 diabetes mellitus with diabetic neuropathy, unspecified: Secondary | ICD-10-CM | POA: Diagnosis not present

## 2017-04-29 DIAGNOSIS — M170 Bilateral primary osteoarthritis of knee: Secondary | ICD-10-CM | POA: Diagnosis not present

## 2017-04-29 DIAGNOSIS — M48061 Spinal stenosis, lumbar region without neurogenic claudication: Secondary | ICD-10-CM | POA: Diagnosis not present

## 2017-04-29 DIAGNOSIS — I10 Essential (primary) hypertension: Secondary | ICD-10-CM | POA: Diagnosis not present

## 2017-04-29 DIAGNOSIS — Z4789 Encounter for other orthopedic aftercare: Secondary | ICD-10-CM | POA: Diagnosis not present

## 2017-04-29 DIAGNOSIS — J45909 Unspecified asthma, uncomplicated: Secondary | ICD-10-CM | POA: Diagnosis not present

## 2017-05-01 DIAGNOSIS — I10 Essential (primary) hypertension: Secondary | ICD-10-CM | POA: Diagnosis not present

## 2017-05-01 DIAGNOSIS — Z4789 Encounter for other orthopedic aftercare: Secondary | ICD-10-CM | POA: Diagnosis not present

## 2017-05-01 DIAGNOSIS — M170 Bilateral primary osteoarthritis of knee: Secondary | ICD-10-CM | POA: Diagnosis not present

## 2017-05-01 DIAGNOSIS — M48061 Spinal stenosis, lumbar region without neurogenic claudication: Secondary | ICD-10-CM | POA: Diagnosis not present

## 2017-05-01 DIAGNOSIS — J45909 Unspecified asthma, uncomplicated: Secondary | ICD-10-CM | POA: Diagnosis not present

## 2017-05-01 DIAGNOSIS — E1140 Type 2 diabetes mellitus with diabetic neuropathy, unspecified: Secondary | ICD-10-CM | POA: Diagnosis not present

## 2017-05-12 ENCOUNTER — Other Ambulatory Visit: Payer: Self-pay | Admitting: Endocrinology

## 2017-05-12 DIAGNOSIS — E785 Hyperlipidemia, unspecified: Secondary | ICD-10-CM

## 2017-05-12 DIAGNOSIS — E1140 Type 2 diabetes mellitus with diabetic neuropathy, unspecified: Secondary | ICD-10-CM

## 2017-05-12 DIAGNOSIS — Z794 Long term (current) use of insulin: Principal | ICD-10-CM

## 2017-05-14 NOTE — Progress Notes (Deleted)
Office Visit Note  Patient: [name]             Date of Birth: [birth year]           MRN: [id number]             PCP: Wenda Low, MD Referring: Wenda Low, MD Visit Date: 05/15/2017 Occupation: _0 @    Subjective:  No chief complaint on file.   History of Present Illness: [name] is a 77 y.o. female ***   Activities of Daily Living:  Patient reports morning stiffness for *** {minute/hour:19697}.   Patient {ACTIONS;DENIES/REPORTS:21021675::"Denies"} nocturnal pain.  Difficulty dressing/grooming: {ACTIONS;DENIES/REPORTS:21021675::"Denies"} Difficulty climbing stairs: {ACTIONS;DENIES/REPORTS:21021675::"Denies"} Difficulty getting out of chair: {ACTIONS;DENIES/REPORTS:21021675::"Denies"} Difficulty using hands for taps, buttons, cutlery, and/or writing: {ACTIONS;DENIES/REPORTS:21021675::"Denies"}   No Rheumatology ROS completed.   PMFS History:  Patient Active Problem List   Diagnosis Date Noted  . Acute blood loss as cause of postoperative anemia 03/30/2017  . Diabetic polyneuropathy associated with secondary diabetes mellitus ([place]) 03/30/2017  . Asthma 03/30/2017  . Dyslipidemia 03/26/2017  . Lumbar stenosis with neurogenic claudication 03/17/2017  . Spinal stenosis of lumbar region 03/03/2017  . Myoclonic jerking   . Nausea   . Acute encephalopathy 07/15/2016  . Lumbar radiculopathy 06/27/2016  . Hypothyroidism 06/26/2016  . Labile blood glucose   . Surgery, elective   . Post-operative pain   . Sickle cell trait ([place])   . Acute blood loss anemia   . History of back surgery   . AKI (acute kidney injury) ([place])   . Herniated nucleus pulposus, L2-3 06/25/2016  . Bilateral primary osteoarthritis of knee 03/26/2016  . Osteoarthritis, hand 03/26/2016  . Other insomnia 03/26/2016  . Memory disorder 02/16/2016  . Fibromyalgia 09/27/2014  . Nocturnal leg cramps 09/27/2014  . Body mass index (BMI) of 30.0-30.9 in adult 08/04/2014  .  Neuropathy 03/10/2014  . Allergic rhinitis 03/10/2014  . Osteoporosis 03/10/2014  . Spinal stenosis of lumbar region with radiculopathy 02/28/2014  . Type II or unspecified type diabetes mellitus without mention of complication, uncontrolled 07/08/2013  . Hypokalemia 11/02/2012  . Essential hypertension, benign 11/02/2012  . Diabetes mellitus with neuropathy ([place]) 10/29/2012  . Hyperlipidemia 10/29/2012  . Spondylolisthesis of lumbar region 03/19/2011  . Lumbar radicular pain 03/19/2011    Past Medical History:  Diagnosis Date  . Acute blood loss anemia   . Acute encephalopathy 07/15/2016  . AKI (acute kidney injury) ([place])   . Anemia    has sickle cell trait  . Anxiety   . Arthritis   . Asthma    has used inhaler in past for asthmatic bronchitis, last time- early 2012  . Bilateral primary osteoarthritis of knee 03/26/2016  . Complication of anesthesia    wakes up shaking  . Diabetes mellitus   . Diabetes mellitus with neuropathy ([place]) 10/29/2012  . Diverticulitis   . Dyslipidemia   . Encephalopathy 06/2016   due to medications after surgery  . Fibromyalgia   . GERD (gastroesophageal reflux disease)    occas. use of  Prilosec  . Heart murmur    sees Dr. Montez Morita, last seen- early 2012  . Herniated nucleus pulposus, L2-3 06/25/2016  . History of back surgery   . Hypertension    02/2010- stress test /w PCP  . Hypothyroidism   . Loose bowel movements 12/2016  . Lumbar radiculopathy 06/27/2016  . Lumbar stenosis with neurogenic claudication 03/17/2017  . Memory disorder 02/16/2016  . Myoclonic jerking   . Neuromuscular disorder (  HCC)    lumbar radiculopathy, lumbago  . Nocturnal leg cramps 09/27/2014  . Osteoporosis 03/10/2014  . Pneumonia   . Sickle cell trait ([place])   . Sleep apnea    borderline sleep apnea, states she no longer uses, early 2012- stopped using   . Spinal stenosis of lumbar region with radiculopathy 02/28/2014  . Spondylolisthesis of lumbar region 03/19/2011    . Type II or unspecified type diabetes mellitus without mention of complication, uncontrolled 07/08/2013    Family History  Problem Relation Age of Onset  . Ovarian cancer Mother   . Cancer - Prostate Father   . Breast cancer Paternal Aunt   . Multiple myeloma Paternal Aunt   . Anesthesia problems Neg Hx   . Hypotension Neg Hx   . Malignant hyperthermia Neg Hx   . Pseudochol deficiency Neg Hx    Past Surgical History:  Procedure Laterality Date  . ABDOMINAL HYSTERECTOMY    . adb.cyst     ovarian cyst  . BACK SURGERY     2012, 2015 (3 total)  . COLONOSCOPY    . EYE SURGERY     macular degeneration treatment - injections  . OVARIAN CYST SURGERY    . POSTERIOR LUMBAR FUSION 4 LEVEL N/A 03/17/2017   Procedure: Decompression of Lumbar One-Two with Thoracic Ten to Lumbar Two Fusion;  Surgeon: Kristeen Miss, MD;  Location: [place];  Service: Neurosurgery;  Laterality: N/A;  Decompression of L1-2 with T10 to L2 Fusion   Social History   Social History Narrative   Patient drinks caffeine occasionally.   Patient is right handed.   Admitted to [facility] 03/21/17   Divorced    Former smoker - stopped 2000    Alcohol - occasionally wine at dinner   Full code     Objective: Vital Signs: There were no vitals taken for this visit.   Physical Exam   Musculoskeletal Exam: ***  CDAI Exam: No CDAI exam completed.    Investigation: No additional findings. CBC Latest Ref Rng & Units 03/29/2017 03/19/2017 03/18/2017  WBC - 6.2 9.3 9.0  Hemoglobin 12.0 - 16.0 9.9(A) 9.1(L) 9.9(L)  Hematocrit 36 - 46 32(A) 28.3(L) 30.2(L)  Platelets 150 - 399 235 193 202   CMP Latest Ref Rng & Units 03/29/2017 03/18/2017 03/12/2017  Glucose 65 - 99 mg/dL - 171(H) 82  BUN 4 - _0 Creatinine 0.5 - 1.1 0.9 0.96 0.92  Sodium 137 - 147 142 138 140  Potassium 3.4 - 5.3 4.6 4.2 4.0  Chloride 101 - 111 mmol/L - 103 105  CO2 22 - 32 mmol/L - 27 27  Calcium 8.9 - 10.3 mg/dL -  8.3(L) 9.6  Total Protein 6.0 - 8.3 g/dL - - -  Total Bilirubin 0.2 - 1.2 mg/dL - - -  Alkaline Phos 39 - 117 U/L - - -  AST 0 - 37 U/L - - -  ALT 0 - 35 U/L - - -    Imaging: No results found.  Speciality Comments: No specialty comments available.    Procedures:  No procedures performed Allergies: Cymbalta [duloxetine hcl]; Baclofen; Betadine [povidone iodine]; Adhesive [tape]; and Lipitor [atorvastatin]   Assessment / Plan:     Visit Diagnoses: Fibromyalgia  Other fatigue  Other insomnia  Primary osteoarthritis of hands, bilateral  Bilateral primary osteoarthritis of knee  Lumbar stenosis with neurogenic claudication  Essential hypertension, benign  Hypothyroidism, unspecified type  Memory disorder  Sickle cell  trait ([place])  History of hyperlipidemia  History of diabetic neuropathy    Orders: No orders of the defined types were placed in this encounter.  No orders of the defined types were placed in this encounter.   Face-to-face time spent with patient was *** minutes. 50% of time was spent in counseling and coordination of care.  Follow-Up Instructions: No Follow-up on file.   Ofilia Neas, PA-C  Note - This record has been created using Dragon software.  Chart creation errors have been sought, but may not always  have been located. Such creation errors do not reflect on  the standard of medical care.

## 2017-05-15 ENCOUNTER — Other Ambulatory Visit: Payer: Medicare Other

## 2017-05-15 ENCOUNTER — Ambulatory Visit: Payer: Medicare Other | Admitting: Rheumatology

## 2017-05-16 ENCOUNTER — Other Ambulatory Visit (INDEPENDENT_AMBULATORY_CARE_PROVIDER_SITE_OTHER): Payer: Medicare Other

## 2017-05-16 DIAGNOSIS — Z794 Long term (current) use of insulin: Secondary | ICD-10-CM | POA: Diagnosis not present

## 2017-05-16 DIAGNOSIS — E785 Hyperlipidemia, unspecified: Secondary | ICD-10-CM | POA: Diagnosis not present

## 2017-05-16 DIAGNOSIS — E1140 Type 2 diabetes mellitus with diabetic neuropathy, unspecified: Secondary | ICD-10-CM | POA: Diagnosis not present

## 2017-05-16 LAB — BASIC METABOLIC PANEL
BUN: 19 mg/dL (ref 6–23)
CO2: 31 mEq/L (ref 19–32)
Calcium: 9.7 mg/dL (ref 8.4–10.5)
Chloride: 101 mEq/L (ref 96–112)
Creatinine, Ser: 1.02 mg/dL (ref 0.40–1.20)
GFR: 67.51 mL/min (ref >60.00)
Glucose, Bld: 95 mg/dL (ref 70–99)
POTASSIUM: 3.8 meq/L (ref 3.5–5.1)
SODIUM: 141 meq/L (ref 135–145)

## 2017-05-16 LAB — LIPID PANEL
CHOL/HDL RATIO: 4
Cholesterol: 182 mg/dL (ref 0–200)
HDL: 50.3 mg/dL (ref >39.00)
LDL CALC: 102 mg/dL — AB (ref 0–99)
NonHDL: 131.72
Triglycerides: 147 mg/dL (ref 0.0–149.0)
VLDL: 29.4 mg/dL (ref 0.0–40.0)

## 2017-05-17 LAB — FRUCTOSAMINE: Fructosamine: 296 umol/L — ABNORMAL HIGH (ref 0–285)

## 2017-05-19 ENCOUNTER — Ambulatory Visit (INDEPENDENT_AMBULATORY_CARE_PROVIDER_SITE_OTHER): Payer: Medicare Other | Admitting: Endocrinology

## 2017-05-19 ENCOUNTER — Ambulatory Visit: Payer: Medicare Other | Admitting: Adult Health

## 2017-05-19 ENCOUNTER — Encounter: Payer: Self-pay | Admitting: Endocrinology

## 2017-05-19 VITALS — BP 138/80 | HR 80 | Ht 65.0 in | Wt 171.4 lb

## 2017-05-19 DIAGNOSIS — E1165 Type 2 diabetes mellitus with hyperglycemia: Secondary | ICD-10-CM | POA: Diagnosis not present

## 2017-05-19 DIAGNOSIS — Z794 Long term (current) use of insulin: Secondary | ICD-10-CM | POA: Diagnosis not present

## 2017-05-19 MED ORDER — LYRICA 50 MG PO CAPS: 50.0000 mg | ORAL_CAPSULE | Freq: Every evening | ORAL | 0 refills | Status: DC | PRN

## 2017-05-19 NOTE — Progress Notes (Signed)
Patient ID: [name], female   DOB: May 11, 1940, 77 y.o.   MRN: [id number]    Reason for Appointment:    follow-up  History of Present Illness    PROBLEM 1: Type 2 diabetes mellitus, date of diagnosis: 1998.   Prior history: She had previously been treated with Byetta, Glumetza, Amaryl, Victoza and  Onglyza However because of inadequate control and intolerance to drugs she was finally given pre-meal insulin along with Amaryl, Glumetza eventually stopped because of side effects and also Lantus added  Recent history:  The insulin regimen is: Novolog 6-8 at breakfast and 10 lunch,  acs Tresiba 24 at lunch  Oral agents: None  Her A1c has been mostly between about 6.8 -7.4, last level in November was 6.6  Current blood sugar patterns and problems with management:  She has not been seen since 8/15 because of her back surgery  Although she thinks that she is eating smaller portions and is not as hungry she is still taking relatively large doses of NovoLog at meals  Her mealtimes are sometimes irregular and she made during the night also check microphone wake up she may eat during the night also  However she is checking her blood sugars mostly FASTING  FASTING blood sugars have been fairly near normal and consistent  No hypoglycemia lately  She does not check readings after meals and has only a couple of readings after lunch or dinner, occasionally will have high readings  If she is eating out she may occasionally have more carbohydrates, she does try to take her NovoLog with her when eating out  Because of leg weakness she is not able to do much exercise   Had previously tried Victoza which caused nausea  Monitors blood glucose: 1-2 times a day, readings as below Glucometer: One Touch.    Mean values apply above for all meters except median for One Touch  PRE-MEAL Fasting Lunch Dinner overnight Overall  Glucose range: 92-46   111-200   Mean/median:  127 124  156 125    Meals: 2-3 meals per day at 10 am. 2 pm and 6-7 pm but inconsistent schedule.  Breakfast may be [place] sausage and egg with toast, occasionally oatmeal or cereal   Her dinner may not have any carbohydrate  Physical activity: exercise:  Water aerobics 3/7 days a week  Certified Diabetes Educator visit: Most recent: 7/13.  Dietician visit: Most recent: 3/13.   Wt Readings from Last 3 Encounters:  05/19/17 171 lb 6.4 oz (77.7 kg)  03/26/17 175 lb 3.2 oz (79.5 kg)  03/25/17 175 lb (96.2 kg)   Complications: Neuropathy  LABS:  Lab Results  Component Value Date   HGBA1C 6.5 (H) 03/12/2017   HGBA1C 6.2 01/13/2017   HGBA1C 6.2 12/18/2016   Lab Results  Component Value Date   MICROALBUR <0.7 01/13/2017   LDLCALC 102 (H) 05/16/2017   CREATININE 1.02 05/16/2017    Multiple other issues are addressed in review of systems  Lab on 05/16/2017  Component Date Value Ref Range Status  . Cholesterol 05/16/2017 182  0 - 200 mg/dL Final   ATP III Classification       Desirable:  < 200 mg/dL               Borderline High:  200 - 239 mg/dL          High:  > = 240 mg/dL  . Triglycerides 05/16/2017 147.0  0.0 - 149.0 mg/dL Final  Normal:  <150 mg/dLBorderline High:  150 - 199 mg/dL  . HDL 05/16/2017 50.30  >39.00 mg/dL Final  . VLDL 05/16/2017 29.4  0.0 - 40.0 mg/dL Final  . LDL Cholesterol 05/16/2017 102* 0 - 99 mg/dL Final  . Total CHOL/HDL Ratio 05/16/2017 4   Final                  Men          Women1/2 Average Risk     3.4          3.3Average Risk          5.0          4.42X Average Risk          9.6          7.13X Average Risk          15.0          11.0                      . NonHDL 05/16/2017 131.72   Final   NOTE:  Non-HDL goal should be 30 mg/dL higher than patient's LDL goal (i.e. LDL goal of < 70 mg/dL, would have non-HDL goal of < 100 mg/dL)  . Sodium 05/16/2017 141  135 - 145 mEq/L Final  . Potassium 05/16/2017 3.8  3.5 - 5.1 mEq/L Final  . Chloride 05/16/2017 101   96 - 112 mEq/L Final  . CO2 05/16/2017 31  19 - 32 mEq/L Final  . Glucose, Bld 05/16/2017 95  70 - 99 mg/dL Final  . BUN 05/16/2017 19  6 - 23 mg/dL Final  . Creatinine, Ser 05/16/2017 1.02  0.40 - 1.20 mg/dL Final  . Calcium 05/16/2017 9.7  8.4 - 10.5 mg/dL Final  . GFR 05/16/2017 67.51  >60.00 mL/min Final  . Fructosamine 05/16/2017 296* 0 - 285 umol/L Final   Comment: Published reference interval for apparently healthy subjects between age 86 and 81 is 49 - 285 umol/L and in a poorly controlled diabetic population is 228 - 563 umol/L with a mean of 396 umol/L.       Allergies as of 05/19/2017      Reactions   Cymbalta [duloxetine Hcl] Diarrhea, Other (See Comments)   Dizziness, headache, irritability   Baclofen    jerks    Betadine [povidone Iodine] Swelling, Other (See Comments)   SWELLING REACTION DESCRIPTION/SEVERITY UNSPECIFIED  Reaction to betadine eye drops   Adhesive [tape] Rash   Lipitor [atorvastatin] Rash      Medication List        Accurate as of 05/19/17  9:25 PM. Always use your most recent med list.          acetaminophen 500 MG tablet Commonly known as:  TYLENOL Take 1,000 mg by mouth every 8 (eight) hours as needed for mild pain or moderate pain.   aspirin 81 MG EC tablet Take 33m by mouth once daily   CAL-MAG-ZINC PO Take 1 tablet by mouth daily.   cetirizine 10 MG tablet Commonly known as:  ZYRTEC Take 10 mg by mouth daily.   ezetimibe 10 MG tablet Commonly known as:  ZETIA TAKE 1 TABLET(10 MG) BY MOUTH DAILY   fluticasone 50 MCG/ACT nasal spray Commonly known as:  FLONASE Place 1 spray into both nostrils daily.   Fluticasone Furoate 100 MCG/ACT Aepb Commonly known as:  ARNUITY ELLIPTA Inhale 1 puff into the lungs daily.   gabapentin 300  MG capsule Commonly known as:  NEURONTIN TAKE 1 CAPSULE(300 MG) BY MOUTH THREE TIMES DAILY   HYDROcodone-acetaminophen 10-325 MG tablet Commonly known as:  NORCO Take 1 tablet by mouth. Take  one tablet every 4 hours as needed for severe pain   HYDROcodone-acetaminophen 5-325 MG tablet Commonly known as:  NORCO/VICODIN Take 1 tablet by mouth. Take one tablet every 6 hours as needed for moderate pain   ibuprofen 800 MG tablet Commonly known as:  ADVIL,MOTRIN Take 800 mg every 8 (eight) hours as needed by mouth for mild pain or moderate pain.   levothyroxine 25 MCG tablet Commonly known as:  SYNTHROID, LEVOTHROID TAKE 1 TABLET(25 MCG) BY MOUTH DAILY BEFORE BREAKFAST   magnesium gluconate 500 MG tablet Commonly known as:  MAGONATE Take 500 mg by mouth 2 (two) times daily.   methocarbamol 500 MG tablet Commonly known as:  ROBAXIN Take 1 tablet (500 mg total) by mouth every 6 (six) hours as needed for muscle spasms.   NOVOLOG FLEXPEN 100 UNIT/ML FlexPen Generic drug:  insulin aspart INJECT 6 UNITS UNDER THE SKIN BEFORE BREAKFAST, 10 UNITS BEFORE LUNCH, AND 9 UNITS BEFORE SUPPER   potassium chloride 10 MEQ tablet Commonly known as:  K-DUR,KLOR-CON TAKE 2 TABLETS(20 MEQ) BY MOUTH TWICE DAILY   PROAIR RESPICLICK 062 (90 Base) MCG/ACT Aepb Generic drug:  Albuterol Sulfate Inhale 2 puffs into the lungs 3 (three) times daily as needed (shortness of breath).   pyridoxine 100 MG tablet Commonly known as:  B-6 Take 100 mg daily by mouth.   SYMBICORT 80-4.5 MCG/ACT inhaler Generic drug:  budesonide-formoterol Inhale 2 puffs into the lungs 2 (two) times daily as needed (shortness of breath).   TRESIBA FLEXTOUCH 100 UNIT/ML Sopn FlexTouch Pen Generic drug:  insulin degludec Inject into the skin. Inject 24 units daily for DM   vitamin B-12 100 MCG tablet Commonly known as:  CYANOCOBALAMIN Take 100 mcg daily by mouth.   vitamin E 400 UNIT capsule Take 400 Units by mouth daily.       Allergies:  Allergies  Allergen Reactions  . Cymbalta [Duloxetine Hcl] Diarrhea and Other (See Comments)    Dizziness, headache, irritability  . Baclofen     jerks   . Betadine  [Povidone Iodine] Swelling and Other (See Comments)    SWELLING REACTION DESCRIPTION/SEVERITY UNSPECIFIED  Reaction to betadine eye drops  . Adhesive [Tape] Rash  . Lipitor [Atorvastatin] Rash    Past Medical History:  Diagnosis Date  . Acute blood loss anemia   . Acute encephalopathy 07/15/2016  . AKI (acute kidney injury) ([place])   . Anemia    has sickle cell trait  . Anxiety   . Arthritis   . Asthma    has used inhaler in past for asthmatic bronchitis, last time- early 2012  . Bilateral primary osteoarthritis of knee 03/26/2016  . Complication of anesthesia    wakes up shaking  . Diabetes mellitus   . Diabetes mellitus with neuropathy ([place]) 10/29/2012  . Diverticulitis   . Dyslipidemia   . Encephalopathy 06/2016   due to medications after surgery  . Fibromyalgia   . GERD (gastroesophageal reflux disease)    occas. use of  Prilosec  . Heart murmur    sees Dr. Montez Morita, last seen- early 2012  . Herniated nucleus pulposus, L2-3 06/25/2016  . History of back surgery   . Hypertension    02/2010- stress test /w PCP  . Hypothyroidism   . Loose bowel movements 12/2016  .  Lumbar radiculopathy 06/27/2016  . Lumbar stenosis with neurogenic claudication 03/17/2017  . Memory disorder 02/16/2016  . Myoclonic jerking   . Neuromuscular disorder (HCC)    lumbar radiculopathy, lumbago  . Nocturnal leg cramps 09/27/2014  . Osteoporosis 03/10/2014  . Pneumonia   . Sickle cell trait ([place])   . Sleep apnea    borderline sleep apnea, states she no longer uses, early 2012- stopped using   . Spinal stenosis of lumbar region with radiculopathy 02/28/2014  . Spondylolisthesis of lumbar region 03/19/2011  . Type II or unspecified type diabetes mellitus without mention of complication, uncontrolled 07/08/2013    Past Surgical History:  Procedure Laterality Date  . ABDOMINAL HYSTERECTOMY    . adb.cyst     ovarian cyst  . BACK SURGERY     2012, 2015 (3 total)  . COLONOSCOPY    . EYE SURGERY      macular degeneration treatment - injections  . OVARIAN CYST SURGERY    . POSTERIOR LUMBAR FUSION 4 LEVEL N/A 03/17/2017   Procedure: Decompression of Lumbar One-Two with Thoracic Ten to Lumbar Two Fusion;  Surgeon: Kristeen Miss, MD;  Location: [facility];  Service: Neurosurgery;  Laterality: N/A;  Decompression of L1-2 with T10 to L2 Fusion    Family History  Problem Relation Age of Onset  . Ovarian cancer Mother   . Cancer - Prostate Father   . Breast cancer Paternal Aunt   . Multiple myeloma Paternal Aunt   . Anesthesia problems Neg Hx   . Hypotension Neg Hx   . Malignant hyperthermia Neg Hx   . Pseudochol deficiency Neg Hx     Social History:  reports that she quit smoking about 18 years ago. she has never used smokeless tobacco. She reports that she drinks alcohol. She reports that she does not use drugs.  ROS    NEUROPATHY: She has had tingling infeet and legs and also has pain going down her legs especially at night. She is taking mostly gabapentin without relief at night  She does not want to take Lyrica because of fear of weight gain although it works better This was also prescribed as needed for fibromyalgia by her rheumatologist   Hyperlipidemia:   The lipid abnormality consists of elevated LDL , high triglyceride and did not tolerate Crestor or lovastatin She thinks they make her cramps worse Diet is usually low in fat meats  She is on Zetia and Has had improvement in her LDL     Lab Results  Component Value Date   CHOL 182 05/16/2017   HDL 50.30 05/16/2017   LDLCALC 102 (H) 05/16/2017   LDLDIRECT 115.0 12/18/2016   TRIG 147.0 05/16/2017   CHOLHDL 4 05/16/2017      HYPERTENSION:  Has been present for several years.  This is generally controlled with taking Diovan HCT  Does not monitor at home recently   Lab Results  Component Value Date   CREATININE 1.02 05/16/2017   BUN 19 05/16/2017   NA 141 05/16/2017   K 3.8 05/16/2017   CL 101 05/16/2017    CO2 31 05/16/2017       Hypothyroidism  She had a relatively high TSH as of 4/17 and was empirically given Synthroid 25 g Although initially was on half a tablet only is not taking the full tablet as she does not think it causes palpitations now Not clear if her fatigue had improved with this  TSH is as follows   Lab Results  Component Value Date   TSH 3.41 12/18/2016   TSH 2.14 08/09/2016   TSH 1.437 07/17/2016   FREET4 0.78 02/22/2016         Examination:   BP 138/80   Pulse 80   Ht _0  (1.651 m)   Wt 171 lb 6.4 oz (77.7 kg)   BMI 28.52 kg/m   Body mass index is 28.52 kg/m.     Assesment/Plan:   1. DIABETES type 2 with BMI of 28 See history of present illness for detailed discussion of management, blood sugar patterns and problems identified  Her blood sugars are generally fairly well controlled with A1c 6.6 Fasting readings are well controlled with current dose of [place]  However she is checking blood sugars mostly in the morning and not clear if her non-fasting readings are controlled also Discussed need to check more readings after meals to help adjust the dose of NovoLog better, she is appearing to be taking larger doses at dinnertime also when she may not be getting much carbohydrate    3. Hypertension: Blood pressure is under fair control with Diovan HCT She needs to monitor more at home    4.   Hyperlipidemia:  Needs to continue her Zetia, LDL is now near 100   5.  NEUROPATHY: She is having more symptoms, not clear why but she can try taking Lyrica at least at night, prescription sent         Patient Instructions  Check blood sugars on waking up  4/7  Also check blood sugars about 2 hours after a meal and do this after different meals by rotation  Recommended blood sugar levels on waking up is 90-130 and about 2 hours after meal is 130-160  Please bring your blood sugar monitor to each visit, thank you     Counseling time on  subjects discussed above is over 50% of today's 25 minute visit   [name] 05/19/2017, 9:25 PM

## 2017-05-19 NOTE — Patient Instructions (Signed)
Check blood sugars on waking up  4/7  Also check blood sugars about 2 hours after a meal and do this after different meals by rotation  Recommended blood sugar levels on waking up is 90-130 and about 2 hours after meal is 130-160  Please bring your blood sugar monitor to each visit, thank you

## 2017-05-20 DIAGNOSIS — R140 Abdominal distension (gaseous): Secondary | ICD-10-CM | POA: Diagnosis not present

## 2017-05-20 DIAGNOSIS — R194 Change in bowel habit: Secondary | ICD-10-CM | POA: Diagnosis not present

## 2017-05-20 DIAGNOSIS — Z86010 Personal history of colonic polyps: Secondary | ICD-10-CM | POA: Diagnosis not present

## 2017-05-20 DIAGNOSIS — R531 Weakness: Secondary | ICD-10-CM | POA: Diagnosis not present

## 2017-05-27 NOTE — Progress Notes (Deleted)
Office Visit Note  Patient: [name]             Date of Birth: 21-Aug-1940           MRN: [id number]             PCP: Wenda Low, MD Referring: Wenda Low, MD Visit Date: 06/09/2017 Occupation: '@GUAROCC' @    Subjective:  No chief complaint on file.   History of Present Illness: [name] is a 77 y.o. female ***   Activities of Daily Living:  Patient reports morning stiffness for *** {minute/hour:19697}.   Patient {ACTIONS;DENIES/REPORTS:21021675::"Denies"} nocturnal pain.  Difficulty dressing/grooming: {ACTIONS;DENIES/REPORTS:21021675::"Denies"} Difficulty climbing stairs: {ACTIONS;DENIES/REPORTS:21021675::"Denies"} Difficulty getting out of chair: {ACTIONS;DENIES/REPORTS:21021675::"Denies"} Difficulty using hands for taps, buttons, cutlery, and/or writing: {ACTIONS;DENIES/REPORTS:21021675::"Denies"}   No Rheumatology ROS completed.   PMFS History:  Patient Active Problem List   Diagnosis Date Noted  . Acute blood loss as cause of postoperative anemia 03/30/2017  . Diabetic polyneuropathy associated with secondary diabetes mellitus ([place]) 03/30/2017  . Asthma 03/30/2017  . Dyslipidemia 03/26/2017  . Lumbar stenosis with neurogenic claudication 03/17/2017  . Spinal stenosis of lumbar region 03/03/2017  . Myoclonic jerking   . Nausea   . Acute encephalopathy 07/15/2016  . Lumbar radiculopathy 06/27/2016  . Hypothyroidism 06/26/2016  . Labile blood glucose   . Surgery, elective   . Post-operative pain   . Sickle cell trait ([place])   . Acute blood loss anemia   . History of back surgery   . AKI (acute kidney injury) ([place])   . Herniated nucleus pulposus, L2-3 06/25/2016  . Bilateral primary osteoarthritis of knee 03/26/2016  . Osteoarthritis, hand 03/26/2016  . Other insomnia 03/26/2016  . Memory disorder 02/16/2016  . Fibromyalgia 09/27/2014  . Nocturnal leg cramps 09/27/2014  . Body mass index (BMI) of 30.0-30.9 in adult 08/04/2014  .  Neuropathy 03/10/2014  . Allergic rhinitis 03/10/2014  . Osteoporosis 03/10/2014  . Spinal stenosis of lumbar region with radiculopathy 02/28/2014  . Type II or unspecified type diabetes mellitus without mention of complication, uncontrolled 07/08/2013  . Hypokalemia 11/02/2012  . Essential hypertension, benign 11/02/2012  . Diabetes mellitus with neuropathy ([place]) 10/29/2012  . Hyperlipidemia 10/29/2012  . Spondylolisthesis of lumbar region 03/19/2011  . Lumbar radicular pain 03/19/2011    Past Medical History:  Diagnosis Date  . Acute blood loss anemia   . Acute encephalopathy 07/15/2016  . AKI (acute kidney injury) ([place])   . Anemia    has sickle cell trait  . Anxiety   . Arthritis   . Asthma    has used inhaler in past for asthmatic bronchitis, last time- early 2012  . Bilateral primary osteoarthritis of knee 03/26/2016  . Complication of anesthesia    wakes up shaking  . Diabetes mellitus   . Diabetes mellitus with neuropathy ([place]) 10/29/2012  . Diverticulitis   . Dyslipidemia   . Encephalopathy 06/2016   due to medications after surgery  . Fibromyalgia   . GERD (gastroesophageal reflux disease)    occas. use of  Prilosec  . Heart murmur    sees Dr. Montez Morita, last seen- early 2012  . Herniated nucleus pulposus, L2-3 06/25/2016  . History of back surgery   . Hypertension    02/2010- stress test /w PCP  . Hypothyroidism   . Loose bowel movements 12/2016  . Lumbar radiculopathy 06/27/2016  . Lumbar stenosis with neurogenic claudication 03/17/2017  . Memory disorder 02/16/2016  . Myoclonic jerking   . Neuromuscular disorder (  HCC)    lumbar radiculopathy, lumbago  . Nocturnal leg cramps 09/27/2014  . Osteoporosis 03/10/2014  . Pneumonia   . Sickle cell trait ([place])   . Sleep apnea    borderline sleep apnea, states she no longer uses, early 2012- stopped using   . Spinal stenosis of lumbar region with radiculopathy 02/28/2014  . Spondylolisthesis of lumbar region 03/19/2011    . Type II or unspecified type diabetes mellitus without mention of complication, uncontrolled 07/08/2013    Family History  Problem Relation Age of Onset  . Ovarian cancer Mother   . Cancer - Prostate Father   . Breast cancer Paternal Aunt   . Multiple myeloma Paternal Aunt   . Anesthesia problems Neg Hx   . Hypotension Neg Hx   . Malignant hyperthermia Neg Hx   . Pseudochol deficiency Neg Hx    Past Surgical History:  Procedure Laterality Date  . ABDOMINAL HYSTERECTOMY    . adb.cyst     ovarian cyst  . BACK SURGERY     2012, 2015 (3 total)  . COLONOSCOPY    . EYE SURGERY     macular degeneration treatment - injections  . OVARIAN CYST SURGERY    . POSTERIOR LUMBAR FUSION 4 LEVEL N/A 03/17/2017   Procedure: Decompression of Lumbar One-Two with Thoracic Ten to Lumbar Two Fusion;  Surgeon: Kristeen Miss, MD;  Location: [facility];  Service: Neurosurgery;  Laterality: N/A;  Decompression of L1-2 with T10 to L2 Fusion   Social History   Social History Narrative   Patient drinks caffeine occasionally.   Patient is right handed.   Ad[facility] to [facility]21/[facility]   Divorced    Former smoker - stopped 2000    Alcohol - occasionally wine at dinner   Full code     Objective: Vital Signs: There were no vitals taken for this visit.   Physical Exam   Musculoskeletal Exam: ***  CDAI Exam: No CDAI exam completed.    Investigation: No additional findings. CBC Latest Ref Rng & Units 03/29/2017 03/19/2017 03/18/2017  WBC - 6.2 9.3 9.0  Hemoglobin 12.0 - 16.0 9.9(A) 9.1(L) 9.9(L)  Hematocrit 36 - 46 32(A) 28.3(L) 30.2(L)  Platelets 150 - 399 235 193 202   CMP Latest Ref Rng & Units 05/16/2017 03/29/2017 03/18/2017  Glucose 70 - 99 mg/dL 95 - 171(H)  BUN 6 - 23 mg/dL '19 14 12  ' Creatinine 0.40 - 1.20 mg/dL 1.02 0.9 0.96  Sodium 135 - 145 mEq/L 141 142 138  Potassium 3.5 - 5.1 mEq/L 3.8 4.6 4.2  Chloride 96 - 112 mEq/L 101 - 103  CO2 19 - 32 mEq/L 31 - 27   Calcium 8.4 - 10.5 mg/dL 9.7 - 8.3(L)  Total Protein 6.0 - 8.3 g/dL - - -  Total Bilirubin 0.2 - 1.2 mg/dL - - -  Alkaline Phos 39 - 117 U/L - - -  AST 0 - 37 U/L - - -  ALT 0 - 35 U/L - - -    Imaging: No results found.  Speciality Comments: No specialty comments available.    Procedures:  No procedures performed Allergies: Cymbalta [duloxetine hcl]; Baclofen; Betadine [povidone iodine]; Adhesive [tape]; and Lipitor [atorvastatin]   Assessment / Plan:     Visit Diagnoses: No diagnosis found.    Orders: No orders of the defined types were placed in this encounter.  No orders of the defined types were placed in this encounter.   Face-to-face time spent  with patient was *** minutes. 50% of time was spent in counseling and coordination of care.  Follow-Up Instructions: No Follow-up on file.   Earnestine Mealing, CMA  Note - This record has been created using Editor, commissioning.  Chart creation errors have been sought, but may not always  have been located. Such creation errors do not reflect on  the standard of medical care.

## 2017-05-28 DIAGNOSIS — E039 Hypothyroidism, unspecified: Secondary | ICD-10-CM | POA: Diagnosis not present

## 2017-05-28 DIAGNOSIS — E559 Vitamin D deficiency, unspecified: Secondary | ICD-10-CM | POA: Diagnosis not present

## 2017-05-28 DIAGNOSIS — R252 Cramp and spasm: Secondary | ICD-10-CM | POA: Diagnosis not present

## 2017-05-29 DIAGNOSIS — M7061 Trochanteric bursitis, right hip: Secondary | ICD-10-CM | POA: Diagnosis not present

## 2017-05-29 DIAGNOSIS — M48062 Spinal stenosis, lumbar region with neurogenic claudication: Secondary | ICD-10-CM | POA: Diagnosis not present

## 2017-05-29 DIAGNOSIS — I10 Essential (primary) hypertension: Secondary | ICD-10-CM | POA: Diagnosis not present

## 2017-05-29 DIAGNOSIS — Z6831 Body mass index (BMI) 31.0-31.9, adult: Secondary | ICD-10-CM | POA: Diagnosis not present

## 2017-06-06 NOTE — Progress Notes (Signed)
Office Visit Note  Patient: [name]             Date of Birth: 12/04/1940           MRN: [id number]             PCP: Wenda Low, MD Referring: Wenda Low, MD Visit Date: 06/10/2017 Occupation: '@GUAROCC' @    Subjective:  Other (BIL knee pain )   History of Present Illness: JOURI THREAT is a 77 y.o. female with history of fibromyalgia osteoarthritis and degenerative disc disease.  She states she has had lumbar spine surgery since her last visit.  She has been experiencing increased pain and discomfort in multiple joints.  She also has tenderness over bilateral trochanteric bursa.  She has been having discomfort in her bilateral knee joints.  Activities of Daily Living:  Patient reports morning stiffness for 2 hours.   Patient Reports nocturnal pain.  Difficulty dressing/grooming: Denies Difficulty climbing stairs: Reports Difficulty getting out of chair: Reports Difficulty using hands for taps, buttons, cutlery, and/or writing: Denies   Review of Systems  Constitutional: Positive for fatigue and weakness. Negative for night sweats, weight gain and weight loss.  HENT: Negative for mouth sores, trouble swallowing, trouble swallowing, mouth dryness and nose dryness.   Eyes: Negative for pain, redness, visual disturbance and dryness.  Respiratory: Negative for cough, shortness of breath and difficulty breathing.   Cardiovascular: Negative for chest pain, palpitations, hypertension, irregular heartbeat and swelling in legs/feet.  Gastrointestinal: Negative for blood in stool, constipation, diarrhea and nausea.  Endocrine: Negative for heat intolerance and increased urination.  Genitourinary: Negative for pelvic pain and vaginal dryness.  Musculoskeletal: Positive for arthralgias, joint pain and morning stiffness. Negative for joint swelling, myalgias, muscle weakness, muscle tenderness and myalgias.  Skin: Positive for hair loss. Negative for color change, rash,  skin tightness, ulcers and sensitivity to sunlight.  Allergic/Immunologic: Negative for susceptible to infections.  Neurological: Negative for dizziness, light-headedness, memory loss and night sweats.  Hematological: Negative for bruising/bleeding tendency and swollen glands.  Psychiatric/Behavioral: Negative for depressed mood and sleep disturbance. The patient is not nervous/anxious.     PMFS History:  Patient Active Problem List   Diagnosis Date Noted  . Acute blood loss as cause of postoperative anemia 03/30/2017  . Diabetic polyneuropathy associated with secondary diabetes mellitus ([place]) 03/30/2017  . Asthma 03/30/2017  . Dyslipidemia 03/26/2017  . Lumbar stenosis with neurogenic claudication 03/17/2017  . Spinal stenosis of lumbar region 03/03/2017  . Myoclonic jerking   . Nausea   . Acute encephalopathy 07/15/2016  . Lumbar radiculopathy 06/27/2016  . Hypothyroidism 06/26/2016  . Labile blood glucose   . Surgery, elective   . Post-operative pain   . Sickle cell trait ([place])   . Acute blood loss anemia   . History of back surgery   . AKI (acute kidney injury) ([place])   . Herniated nucleus pulposus, L2-3 06/25/2016  . Bilateral primary osteoarthritis of knee 03/26/2016  . Osteoarthritis, hand 03/26/2016  . Other insomnia 03/26/2016  . Memory disorder 02/16/2016  . Fibromyalgia 09/27/2014  . Nocturnal leg cramps 09/27/2014  . Body mass index (BMI) of 30.0-30.9 in adult 08/04/2014  . Neuropathy 03/10/2014  . Allergic rhinitis 03/10/2014  . Osteoporosis 03/10/2014  . Spinal stenosis of lumbar region with radiculopathy 02/28/2014  . Type II or unspecified type diabetes mellitus without mention of complication, uncontrolled 07/08/2013  . Hypokalemia 11/02/2012  . Essential hypertension, benign 11/02/2012  . Diabetes mellitus with  neuropathy ([place]) 10/29/2012  . Hyperlipidemia 10/29/2012  . Spondylolisthesis of lumbar region 03/19/2011  . Lumbar radicular pain 03/19/2011      Past Medical History:  Diagnosis Date  . Acute blood loss anemia   . Acute encephalopathy 07/15/2016  . AKI (acute kidney injury) ([place])   . Anemia    has sickle cell trait  . Anxiety   . Arthritis   . Asthma    has used inhaler in past for asthmatic bronchitis, last time- early 2012  . Bilateral primary osteoarthritis of knee 03/26/2016  . Complication of anesthesia    wakes up shaking  . Diabetes mellitus   . Diabetes mellitus with neuropathy ([place]) 10/29/2012  . Diverticulitis   . Dyslipidemia   . Encephalopathy 06/2016   due to medications after surgery  . Fibromyalgia   . GERD (gastroesophageal reflux disease)    occas. use of  Prilosec  . Heart murmur    sees Dr. Montez Morita, last seen- early 2012  . Herniated nucleus pulposus, L2-3 06/25/2016  . History of back surgery   . Hypertension    02/2010- stress test /w PCP  . Hypothyroidism   . Loose bowel movements 12/2016  . Lumbar radiculopathy 06/27/2016  . Lumbar stenosis with neurogenic claudication 03/17/2017  . Memory disorder 02/16/2016  . Myoclonic jerking   . Neuromuscular disorder (HCC)    lumbar radiculopathy, lumbago  . Nocturnal leg cramps 09/27/2014  . Osteoporosis 03/10/2014  . Pneumonia   . Sickle cell trait ([place])   . Sleep apnea    borderline sleep apnea, states she no longer uses, early 2012- stopped using   . Spinal stenosis of lumbar region with radiculopathy 02/28/2014  . Spondylolisthesis of lumbar region 03/19/2011  . Type II or unspecified type diabetes mellitus without mention of complication, uncontrolled 07/08/2013    Family History  Problem Relation Age of Onset  . Ovarian cancer Mother   . Cancer - Prostate Father   . Breast cancer Paternal Aunt   . Multiple myeloma Paternal Aunt   . Anesthesia problems Neg Hx   . Hypotension Neg Hx   . Malignant hyperthermia Neg Hx   . Pseudochol deficiency Neg Hx    Past Surgical History:  Procedure Laterality Date  . ABDOMINAL HYSTERECTOMY    . adb.cyst      ovarian cyst  . BACK SURGERY     2012, 2015 (3 total)  . BACK SURGERY  2018   01/2017 and 03/17/2017  . COLONOSCOPY    . EYE SURGERY     macular degeneration treatment - injections  . OVARIAN CYST SURGERY    . POSTERIOR LUMBAR FUSION 4 LEVEL N/A 03/17/2017   Procedure: Decompression of Lumbar One-Two with Thoracic Ten to Lumbar Two Fusion;  Surgeon: Kristeen Miss, MD;  Location: [facility];  Service: Neurosurgery;  Laterality: N/A;  Decompression of L1-2 with T10 to L2 Fusion   Social History   Social History Narrative   Patient drinks caffeine occasionally.   Patient is right handed.   Admitted to [facility] 03/21/17   Divorced    Former smoker - stopped 2000    Alcohol - occasionally wine at dinner   Full code     Objective: Vital Signs: BP 117/72 (BP Location: Left Arm, Patient Position: Sitting, Cuff Size: Normal)   Pulse 82   Resp 17   Ht '5\' 5"'  (1.651 m)   Wt 167 lb (75.8 kg)   BMI 27.79 kg/m  Physical Exam   Musculoskeletal Exam: C-spine good range of motion.  She has limited range of motion of thoracic and lumbar spine.  She had lumbar spine fusion.  Shoulder joints elbows joints wrist joints good range of motion.  She has DIP PIP thickening in her hands consistent with osteoarthritis.  She has tenderness on palpation over her left trochanteric bursa consistent with trochanteric bursitis.  Knee joints ankles MTPs with good range of motion.  Fibromyalgia tender points 16 out of 18 positive.  CDAI Exam: No CDAI exam completed.    Investigation: No additional findings. CMP Latest Ref Rng & Units 05/16/2017 03/29/2017 03/18/2017  Glucose 70 - 99 mg/dL 95 - 171(H)  BUN 6 - 23 mg/dL '19 14 12  ' Creatinine 0.40 - 1.20 mg/dL 1.02 0.9 0.96  Sodium 135 - 145 mEq/L 141 142 138  Potassium 3.5 - 5.1 mEq/L 3.8 4.6 4.2  Chloride 96 - 112 mEq/L 101 - 103  CO2 19 - 32 mEq/L 31 - 27  Calcium 8.4 - 10.5 mg/dL 9.7 - 8.3(L)  Total Protein 6.0 - 8.3 g/dL - - -   Total Bilirubin 0.2 - 1.2 mg/dL - - -  Alkaline Phos 39 - 117 U/L - - -  AST 0 - 37 U/L - - -  ALT 0 - 35 U/L - - -   CMP Latest Ref Rng & Units 05/16/2017 03/29/2017 03/18/2017  Glucose 70 - 99 mg/dL 95 - 171(H)  BUN 6 - 23 mg/dL '19 14 12  ' Creatinine 0.40 - 1.20 mg/dL 1.02 0.9 0.96  Sodium 135 - 145 mEq/L 141 142 138  Potassium 3.5 - 5.1 mEq/L 3.8 4.6 4.2  Chloride 96 - 112 mEq/L 101 - 103  CO2 19 - 32 mEq/L 31 - 27  Calcium 8.4 - 10.5 mg/dL 9.7 - 8.3(L)  Total Protein 6.0 - 8.3 g/dL - - -  Total Bilirubin 0.2 - 1.2 mg/dL - - -  Alkaline Phos 39 - 117 U/L - - -  AST 0 - 37 U/L - - -  ALT 0 - 35 U/L - - -    Imaging: No results found.  Speciality Comments: No specialty comments available.    Procedures:  Large Joint Inj: L greater trochanter on 06/10/2017 4:41 PM Indications: pain Details: 27 G 1.5 in needle, lateral approach  Arthrogram: No  Medications: 1.5 mL lidocaine (PF) 1 %; 40 mg triamcinolone acetonide 40 MG/ML Aspirate: 0 mL Outcome: tolerated well, no immediate complications Procedure, treatment alternatives, risks and benefits explained, specific risks discussed. Consent was given by the patient. Immediately prior to procedure a time out was called to verify the correct patient, procedure, equipment, support staff and site/side marked as required. Patient was prepped and draped in the usual sterile fashion.     Allergies: Cymbalta [duloxetine hcl]; Baclofen; Betadine [povidone iodine]; Adhesive [tape]; and Lipitor [atorvastatin]   Assessment / Plan:     Visit Diagnoses: Fibromyalgia has been having flare of fibromyalgia with generalized pain and discomfort.  She has positive tender points.  Primary osteoarthritis of both hands: She has chronic discomfort.  Joint protection muscle strengthening discussed.  Trochanteric bursitis of left hip: She has tenderness over left trochanteric bursa.  Per her request after informed consent was obtained the bursa was  injected with cortisone as described above.  I will also referred to physical therapy.  DDD (degenerative disc disease), lumbar - s/p fusion x3.  She continues to have some lower back pain.  I will refer  her to physical therapy .  Age-related osteoporosis without current pathological fracture - Ttd with Fosamax in the past by her PCP.  The patient has been getting DEXA through her PCP.  Other insomnia: Good sleep hygiene was discussed.  History of hypertension: I have advised to monitor blood pressure after the cortisone injection.  History of diabetes mellitus: She will be monitoring her blood sugar.  History of sleep apnea  Dyslipidemia  History of hypothyroidism  History of neuropathy    Orders: No orders of the defined types were placed in this encounter.  No orders of the defined types were placed in this encounter.   Face-to-face time spent with patient was 30 minutes.Greater than 50% of time was spent in counseling and coordination of care.  Follow-Up Instructions: Return in about 6 months (around 12/08/2017) for FMS, OA, DDD.   Bo Merino, MD  Note - This record has been created using Editor, commissioning.  Chart creation errors have been sought, but may not always  have been located. Such creation errors do not reflect on  the standard of medical care.

## 2017-06-09 ENCOUNTER — Ambulatory Visit: Payer: Medicare Other | Admitting: Rheumatology

## 2017-06-10 ENCOUNTER — Encounter: Payer: Self-pay | Admitting: Rheumatology

## 2017-06-10 ENCOUNTER — Ambulatory Visit (INDEPENDENT_AMBULATORY_CARE_PROVIDER_SITE_OTHER): Payer: Medicare Other | Admitting: Rheumatology

## 2017-06-10 VITALS — BP 117/72 | HR 82 | Resp 17 | Ht 65.0 in | Wt 167.0 lb

## 2017-06-10 DIAGNOSIS — M5136 Other intervertebral disc degeneration, lumbar region: Secondary | ICD-10-CM

## 2017-06-10 DIAGNOSIS — Z8639 Personal history of other endocrine, nutritional and metabolic disease: Secondary | ICD-10-CM

## 2017-06-10 DIAGNOSIS — Z8679 Personal history of other diseases of the circulatory system: Secondary | ICD-10-CM

## 2017-06-10 DIAGNOSIS — M797 Fibromyalgia: Secondary | ICD-10-CM

## 2017-06-10 DIAGNOSIS — E785 Hyperlipidemia, unspecified: Secondary | ICD-10-CM | POA: Diagnosis not present

## 2017-06-10 DIAGNOSIS — G4709 Other insomnia: Secondary | ICD-10-CM

## 2017-06-10 DIAGNOSIS — M19042 Primary osteoarthritis, left hand: Secondary | ICD-10-CM | POA: Diagnosis not present

## 2017-06-10 DIAGNOSIS — M19041 Primary osteoarthritis, right hand: Secondary | ICD-10-CM

## 2017-06-10 DIAGNOSIS — M810 Age-related osteoporosis without current pathological fracture: Secondary | ICD-10-CM

## 2017-06-10 DIAGNOSIS — M7062 Trochanteric bursitis, left hip: Secondary | ICD-10-CM

## 2017-06-10 DIAGNOSIS — M51369 Other intervertebral disc degeneration, lumbar region without mention of lumbar back pain or lower extremity pain: Secondary | ICD-10-CM

## 2017-06-10 DIAGNOSIS — Z8669 Personal history of other diseases of the nervous system and sense organs: Secondary | ICD-10-CM | POA: Diagnosis not present

## 2017-06-11 ENCOUNTER — Telehealth: Payer: Self-pay | Admitting: Rheumatology

## 2017-06-11 MED ORDER — TRIAMCINOLONE ACETONIDE 40 MG/ML IJ SUSP
40.0000 mg | INTRAMUSCULAR | Status: AC | PRN
  Administered 2017-06-10: 40 mg via INTRA_ARTICULAR

## 2017-06-11 MED ORDER — LIDOCAINE HCL (PF) 1 % IJ SOLN
1.5000 mL | INTRAMUSCULAR | Status: AC | PRN
  Administered 2017-06-10: 1.5 mL

## 2017-06-11 NOTE — Telephone Encounter (Signed)
Patient calling to let you know she would like to go to [facility] [place], [place]. # [phone]. Please cancel referral to other physical therapy facility.

## 2017-06-17 DIAGNOSIS — I10 Essential (primary) hypertension: Secondary | ICD-10-CM | POA: Diagnosis not present

## 2017-06-19 NOTE — Telephone Encounter (Signed)
Appt scheduled 06/23/17

## 2017-06-23 ENCOUNTER — Ambulatory Visit: Payer: Medicare Other | Attending: Rheumatology | Admitting: Physical Therapy

## 2017-06-23 ENCOUNTER — Encounter: Payer: Self-pay | Admitting: Physical Therapy

## 2017-06-23 ENCOUNTER — Other Ambulatory Visit: Payer: Self-pay

## 2017-06-23 DIAGNOSIS — M545 Low back pain, unspecified: Secondary | ICD-10-CM

## 2017-06-23 DIAGNOSIS — M6281 Muscle weakness (generalized): Secondary | ICD-10-CM | POA: Diagnosis not present

## 2017-06-23 DIAGNOSIS — R262 Difficulty in walking, not elsewhere classified: Secondary | ICD-10-CM | POA: Insufficient documentation

## 2017-06-23 NOTE — Therapy (Signed)
[facility] [place], [place], [address] Phone: [phone]   Fax:  [phone]  Physical Therapy Evaluation  Patient Details  Name: [name] MRN: [id number] Date of Birth: 03/07/1941 Referring Provider: Toula Moos   Encounter Date: 06/23/2017  PT End of Session - 06/23/17 1550    Visit Number  1    Date for PT Re-Evaluation  08/23/17    PT Start Time  1287    PT Stop Time  1620    PT Time Calculation (min)  50 min    Activity Tolerance  Patient limited by pain    Behavior During Therapy  [facility] for tasks assessed/performed       Past Medical History:  Diagnosis Date  . Acute blood loss anemia   . Acute encephalopathy 07/15/2016  . AKI (acute kidney injury) ([place])   . Anemia    has sickle cell trait  . Anxiety   . Arthritis   . Asthma    has used inhaler in past for asthmatic bronchitis, last time- early 2012  . Bilateral primary osteoarthritis of knee 03/26/2016  . Complication of anesthesia    wakes up shaking  . Diabetes mellitus   . Diabetes mellitus with neuropathy ([place]) 10/29/2012  . Diverticulitis   . Dyslipidemia   . Encephalopathy 06/2016   due to medications after surgery  . Fibromyalgia   . GERD (gastroesophageal reflux disease)    occas. use of  Prilosec  . Heart murmur    sees Dr. Montez Morita, last seen- early 2012  . Herniated nucleus pulposus, L2-3 06/25/2016  . History of back surgery   . Hypertension    02/2010- stress test /w PCP  . Hypothyroidism   . Loose bowel movements 12/2016  . Lumbar radiculopathy 06/27/2016  . Lumbar stenosis with neurogenic claudication 03/17/2017  . Memory disorder 02/16/2016  . Myoclonic jerking   . Neuromuscular disorder (HCC)    lumbar radiculopathy, lumbago  . Nocturnal leg cramps 09/27/2014  . Osteoporosis 03/10/2014  . Pneumonia   . Sickle cell trait ([place])   . Sleep apnea    borderline sleep apnea, states she no longer uses, early  2012- stopped using   . Spinal stenosis of lumbar region with radiculopathy 02/28/2014  . Spondylolisthesis of lumbar region 03/19/2011  . Type II or unspecified type diabetes mellitus without mention of complication, uncontrolled 07/08/2013    Past Surgical History:  Procedure Laterality Date  . ABDOMINAL HYSTERECTOMY    . adb.cyst     ovarian cyst  . BACK SURGERY     2012, 2015 (3 total)  . BACK SURGERY  2018   01/2017 and 03/17/2017  . COLONOSCOPY    . EYE SURGERY     macular degeneration treatment - injections  . OVARIAN CYST SURGERY    . POSTERIOR LUMBAR FUSION 4 LEVEL N/A 03/17/2017   Procedure: Decompression of Lumbar One-Two with Thoracic Ten to Lumbar Two Fusion;  Surgeon: Kristeen Miss, MD;  Location: [facility];  Service: Neurosurgery;  Laterality: N/A;  Decompression of L1-2 with T10 to L2 Fusion    There were no vitals filed for this visit.   Subjective Assessment - 06/23/17 1532    Subjective  Patient reports that she has had 5 lumbar surgeries, the most recent being 02/25/2017.  She was at a SNF for 1-2 weeks, then home PT.  She reports that she started having leg cramps and stopped any activities.  She  reports that her family has a history of cramps.  She reports that recently she has had a great deal of difficulty getting up from sitting, due to a cathc and pain in the low back.      Pertinent History  has a history of bursitis in the bilateral hips    Limitations  Sitting;Lifting;Standing;Walking    Patient Stated Goals  have less pain, get up from sitting easier    Currently in Pain?  Yes    Pain Score  7     Pain Location  Back pain in the bilateral hips    Pain Orientation  Lower    Pain Descriptors / Indicators  Aching;Tender c/o a catch in the low back with trying to stand up    Pain Type  Acute pain;Surgical pain    Pain Onset  More than a month ago    Pain Frequency  Constant    Aggravating Factors   standing, walking, getting up from sitting pain up to 9-10/10     Pain Relieving Factors  heat helps some, pain meds help some at best pain is a 6/10    Effect of Pain on Daily Activities  Reports limits everything, "has changed who I am, the pain"         [facility] PT Assessment - 06/23/17 0001      Assessment   Medical Diagnosis  LBP    Referring Provider  Shali Devishwar    Onset Date/Surgical Date  02/25/17    Prior Therapy  yes at home      Precautions   Precautions  None      Balance Screen   Has the patient fallen in the past 6 months  No    Has the patient had a decrease in activity level because of a fear of falling?   No    Is the patient reluctant to leave their home because of a fear of falling?   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    Leisure  no exercise      Posture/Postural Control   Posture Comments  gaurded posture, fwd head, rounded shoulders      ROM / Strength   AROM / PROM / Strength  AROM;Strength      AROM   Overall AROM Comments  lumbar ROM decrease 75% for all motions      Strength   Overall Strength Comments  4-/5 with hip and back pain      Palpation   Palpation comment  she is very tender in the SI area, the buttocks, the GT and the ITB areas      Transfers   Comments  has a very difficult time getting up from sitting, has to use hands and seems to get stuck and sometimes needs help to get up      Ambulation/Gait   Gait Comments  slow, no device, shuffles feet             Objective measurements completed on examination: See above findings.      [facility] Adult PT Treatment/Exercise - 06/23/17 0001      Exercises   Exercises  Lumbar      Lumbar Exercises: Aerobic   Nustep  Level 3  x 5 minutes      Modalities   Modalities  Electrical Stimulation;Moist Heat      Moist Heat Therapy   Number Minutes Moist Heat   15 Minutes    Moist Heat Location  Lumbar Spine      Electrical Stimulation   Electrical Stimulation Location  Low back and hip areas    Electrical Stimulation Action  IFC    Electrical Stimulation Parameters  sitting    Electrical Stimulation Goals  Pain             PT Education - 06/23/17 1550    Education provided  Yes    Education Details  Wms flexion    Person(s) Educated  Patient    Methods  Explanation;Demonstration;Handout    Comprehension  Verbalized understanding       PT Short Term Goals - 06/23/17 1600      PT SHORT TERM GOAL #1   Title  independent with initial HEP    Time  2    Period  Weeks    Status  New        PT Long Term Goals - 06/23/17 1601      PT LONG TERM GOAL #1   Title  decrease pain 50%    Time  8    Period  Weeks    Status  New      PT LONG TERM GOAL #2   Title  get up from sitting without pain and without hands    Time  8    Period  Weeks    Status  New      PT LONG TERM GOAL #3   Title  increase lumbar ROM 25%    Time  8    Period  Weeks    Status  New      PT LONG TERM GOAL #4   Title  walk 1000 feet    Time  8    Period  Weeks    Status  New             Plan - 06/23/17 1551    Clinical Impression Statement  Patient has had 5 lumbar surgeries.  She had two last year, with the most recent one being November.  She reports that she was doing okay but in January she started having significant pain with getting up from sitting, c/o pain in the low back and the hips, reports that she has a hx of hip bursitis.  Her ROM is limited, her strength is limited, she is extremely tender in the SI and hip areas.    Clinical Presentation  Evolving    Clinical Presentation due to:  recent surgery, 5 total Lumbar suregeries, fibromyalgia    Clinical Decision Making  Moderate    Rehab Potential  Good    PT Frequency  2x / week    PT Duration  8 weeks    PT Treatment/Interventions  ADLs/Self Care Home  Management;Cryotherapy;Electrical Stimulation;Moist Heat;Neuromuscular re-education;Therapeutic exercise;Therapeutic activities;Functional mobility training;Stair training;Patient/family education;Manual techniques    PT Next Visit Plan  work on getting patient moving    Consulted and Agree with Plan of Care  Patient       Patient will benefit from skilled therapeutic intervention in order to improve the following deficits and impairments:  Abnormal gait, Decreased range of motion, Difficulty walking, Decreased endurance, Increased muscle spasms, Decreased activity tolerance, Pain, Impaired flexibility, Improper body mechanics, Postural dysfunction, Decreased strength, Decreased mobility  Visit  Diagnosis: Acute bilateral low back pain without sciatica - Plan: PT plan of care cert/re-cert  Difficulty in walking, not elsewhere classified - Plan: PT plan of care cert/re-cert  Muscle weakness (generalized) - Plan: PT plan of care cert/re-cert     Problem List Patient Active Problem List   Diagnosis Date Noted  . Acute blood loss as cause of postoperative anemia 03/30/2017  . Diabetic polyneuropathy associated with secondary diabetes mellitus ([place]) 03/30/2017  . Asthma 03/30/2017  . Dyslipidemia 03/26/2017  . Lumbar stenosis with neurogenic claudication 03/17/2017  . Spinal stenosis of lumbar region 03/03/2017  . Myoclonic jerking   . Nausea   . Acute encephalopathy 07/15/2016  . Lumbar radiculopathy 06/27/2016  . Hypothyroidism 06/26/2016  . Labile blood glucose   . Surgery, elective   . Post-operative pain   . Sickle cell trait ([place])   . Acute blood loss anemia   . History of back surgery   . AKI (acute kidney injury) ([place])   . Herniated nucleus pulposus, L2-3 06/25/2016  . Bilateral primary osteoarthritis of knee 03/26/2016  . Osteoarthritis, hand 03/26/2016  . Other insomnia 03/26/2016  . Memory disorder 02/16/2016  . Fibromyalgia 09/27/2014  . Nocturnal leg cramps  09/27/2014  . Body mass index (BMI) of 30.0-30.9 in adult 08/04/2014  . Neuropathy 03/10/2014  . Allergic rhinitis 03/10/2014  . Osteoporosis 03/10/2014  . Spinal stenosis of lumbar region with radiculopathy 02/28/2014  . Type II or unspecified type diabetes mellitus without mention of complication, uncontrolled 07/08/2013  . Hypokalemia 11/02/2012  . Essential hypertension, benign 11/02/2012  . Diabetes mellitus with neuropathy ([place]) 10/29/2012  . Hyperlipidemia 10/29/2012  . Spondylolisthesis of lumbar region 03/19/2011  . Lumbar radicular pain 03/19/2011    Sumner Boast., PT 06/23/2017, 4:06 PM  [facility] [place] [place], [place], [address] Phone: [phone]   Fax:  [phone]  Name: MARQUITE ATTWOOD MRN: [id number] Date of Birth: [birth year]

## 2017-06-25 ENCOUNTER — Telehealth: Payer: Self-pay | Admitting: Neurology

## 2017-06-25 NOTE — Telephone Encounter (Signed)
Called pt. Scheduled appt for 07/08/17 at 1:30pm to see Dr. Jannifer Franklin to discuss tremors. Pt verbalized understanding and appreciation for call.

## 2017-06-25 NOTE — Telephone Encounter (Signed)
Patient states she has had tremors for a while and would like a soon appointment with Dr. Jannifer Franklin to discuss. She wants to  rule out Parkinson's.

## 2017-06-27 ENCOUNTER — Ambulatory Visit: Payer: Medicare Other | Admitting: Physical Therapy

## 2017-06-27 ENCOUNTER — Other Ambulatory Visit: Payer: Self-pay | Admitting: Neurology

## 2017-06-27 ENCOUNTER — Encounter: Payer: Self-pay | Admitting: Physical Therapy

## 2017-06-27 DIAGNOSIS — M6281 Muscle weakness (generalized): Secondary | ICD-10-CM | POA: Diagnosis not present

## 2017-06-27 DIAGNOSIS — R262 Difficulty in walking, not elsewhere classified: Secondary | ICD-10-CM

## 2017-06-27 DIAGNOSIS — M545 Low back pain, unspecified: Secondary | ICD-10-CM

## 2017-06-27 NOTE — Therapy (Signed)
[place], [place], [address] Phone: [phone]   Fax:  [phone]  Physical Therapy Treatment  Patient Details  Name: [name] MRN: [id number] Date of Birth: Sep 18, 1940 Referring Provider: Toula Moos   Encounter Date: 06/27/2017  PT End of Session - 06/27/17 1230    Visit Number  2    Date for PT Re-Evaluation  08/23/17    PT Start Time  0935    PT Stop Time  1020    PT Time Calculation (min)  45 min    Activity Tolerance  Patient limited by pain       Past Medical History:  Diagnosis Date   Acute blood loss anemia    Acute encephalopathy 07/15/2016   AKI (acute kidney injury) ([place])    Anemia    has sickle cell trait   Anxiety    Arthritis    Asthma    has used inhaler in past for asthmatic bronchitis, last time- early 2012   Bilateral primary osteoarthritis of knee 85/05/7780   Complication of anesthesia    wakes up shaking   Diabetes mellitus    Diabetes mellitus with neuropathy ([place]) 10/29/2012   Diverticulitis    Dyslipidemia    Encephalopathy 06/2016   due to medications after surgery   Fibromyalgia    GERD (gastroesophageal reflux disease)    occas. use of  Prilosec   Heart murmur    sees Dr. Montez Morita, last seen- early 2012   Herniated nucleus pulposus, L2-3 06/25/2016   History of back surgery    Hypertension    02/2010- stress test /w PCP   Hypothyroidism    Loose bowel movements 12/2016   Lumbar radiculopathy 06/27/2016   Lumbar stenosis with neurogenic claudication 03/17/2017   Memory disorder 02/16/2016   Myoclonic jerking    Neuromuscular disorder (HCC)    lumbar radiculopathy, lumbago   Nocturnal leg cramps 09/27/2014   Osteoporosis 03/10/2014   Pneumonia    Sickle cell trait ([place])    Sleep apnea    borderline sleep apnea, states she no longer uses, early 2012- stopped using    Spinal stenosis of lumbar region with  radiculopathy 02/28/2014   Spondylolisthesis of lumbar region 03/19/2011   Type II or unspecified type diabetes mellitus without mention of complication, uncontrolled 07/08/2013    Past Surgical History:  Procedure Laterality Date   ABDOMINAL HYSTERECTOMY     adb.cyst     ovarian cyst   BACK SURGERY     2012, 2015 (3 total)   BACK SURGERY  2018   01/2017 and 03/17/2017   COLONOSCOPY     EYE SURGERY     macular degeneration treatment - injections   OVARIAN CYST SURGERY     POSTERIOR LUMBAR FUSION 4 LEVEL N/A 03/17/2017   Procedure: Decompression of Lumbar One-Two with Thoracic Ten to Lumbar Two Fusion;  Surgeon: Kristeen Miss, MD;  Location: [facility];  Service: Neurosurgery;  Laterality: N/A;  Decompression of L1-2 with T10 to L2 Fusion    There were no vitals filed for this visit.  Subjective Assessment - 06/27/17 1234    Subjective  Reports that she feels she may have Parkinson's  disease d/t what she refers to as tremors Right> Left.  States she cont to suffer from nocturnal cramping that limits her sleeping tolerance to ~2hours at a time.  She sleeps best in the recliner, but even then for short bursts at best.  Enters with reports of considerable pain across her low back that worsens when she performs sit to stand transfers.  Has an appointment scheduled with her neurologist regarding "tremors"                      [facility] Adult PT Treatment/Exercise - 06/27/17 0001      Transfers   Transfers  Sit to Stand;Sit to Supine supine to side lying    Sit to Stand  5: Supervision;From elevated surface;With upper extremity assist    Sit to Stand Details  Verbal cues for sequencing;Verbal cues for technique    Sit to Supine  3: Mod assist;4: Min assist;With upper extremity assist    Comments  significant difficulty w/ multiple instances of "cramping in lower legs"      Lumbar Exercises: Stretches   Passive Hamstring Stretch  Left;Right;5 reps;30 seconds    ITB  Stretch  Left;Right;5 reps;30 seconds      Lumbar Exercises: Seated   Other Seated Lumbar Exercises  sitto stand from partially elevated plinth 2x5      Manual Therapy   Manual Therapy  Soft tissue mobilization;Myofascial release;Passive ROM    Manual therapy comments  TTP throughout bilateral low back, glutes, and lateral hips    Soft tissue mobilization  to bilateral low back in sidelying d/t difficulty transferriing to prone    Myofascial Release  modified bilateral piriformis release in sidelying    Passive ROM  PROM bilateral hip ER during/ following piriformis release               PT Short Term Goals - 06/23/17 1600      PT SHORT TERM GOAL #1   Title  independent with initial HEP    Time  2    Period  Weeks    Status  New        PT Long Term Goals - 06/23/17 1601      PT LONG TERM GOAL #1   Title  decrease pain 50%    Time  8    Period  Weeks    Status  New      PT LONG TERM GOAL #2   Title  get up from sitting without pain and without hands    Time  8    Period  Weeks    Status  New      PT LONG TERM GOAL #3   Title  increase lumbar ROM 25%    Time  8    Period  Weeks    Status  New      PT LONG TERM GOAL #4   Title  walk 1000 feet    Time  8    Period  Weeks    Status  New            Plan - 06/27/17 1230    Clinical Impression Statement  Rev Otila Kluver had considerable difficulty with transfers from sit to stand with report of increased low back pain and "spasm" with attempts to do this.  Lacks hip hinge with standing, though does a better job with this from stand to sit.  Very slow with multiple stopping points to achieve supine to sidelying. unable to achieve prone lying.  Did respnd well  to soft tissue work to bilateral glutes, low back, and lateral hips with decreased soft tissue tension throughout as a result, though did have recurrence of pain with transfer back to supine.  Improved sit  to stand at end of session with improved gait cadence  as she left the clinic. Encouraged her to return to aquatic center for gentle exercise and walking as tolerated.         Patient will benefit from skilled therapeutic intervention in order to improve the following deficits and impairments:     Visit Diagnosis: Acute bilateral low back pain without sciatica  Difficulty in walking, not elsewhere classified  Muscle weakness (generalized)     Problem List Patient Active Problem List   Diagnosis Date Noted   Acute blood loss as cause of postoperative anemia 03/30/2017   Diabetic polyneuropathy associated with secondary diabetes mellitus ([facility]) 03/30/2017   Asthma 03/30/2017   Dyslipidemia 03/26/2017   Lumbar stenosis with neurogenic claudication 03/17/2017   Spinal stenosis of lumbar region 03/03/2017   Myoclonic jerking    Nausea    Acute encephalopathy 07/15/2016   Lumbar radiculopathy 06/27/2016   Hypothyroidism 06/26/2016   Labile blood glucose    Surgery, elective    Post-operative pain    Sickle cell trait (HCC)    Acute blood loss anemia    History of back surgery    AKI (acute kidney injury) ([place])    Herniated nucleus pulposus, L2-3 06/25/2016   Bilateral primary osteoarthritis of knee 03/26/2016   Osteoarthritis, hand 03/26/2016   Other insomnia 03/26/2016   Memory disorder 02/16/2016   Fibromyalgia 09/27/2014   Nocturnal leg cramps 09/27/2014   Body mass index (BMI) of 30.0-30.9 in adult 08/04/2014   Neuropathy 03/10/2014   Allergic rhinitis 03/10/2014   Osteoporosis 03/10/2014   Spinal stenosis of lumbar region with radiculopathy 02/28/2014   Type II or unspecified type diabetes mellitus without mention of complication, uncontrolled 07/08/2013   Hypokalemia 11/02/2012   Essential hypertension, benign 11/02/2012   Diabetes mellitus with neuropathy ([place]) 10/29/2012   Hyperlipidemia 10/29/2012   Spondylolisthesis of lumbar region 03/19/2011   Lumbar radicular pain  03/19/2011    Olean Ree, PTA 06/27/2017, 12:37 PM  [place], [place], [address] Phone: [phone]   Fax:  [phone]  Name: [name] MRN: [id number] Date of Birth: 12-01-1940

## 2017-06-29 ENCOUNTER — Other Ambulatory Visit: Payer: Self-pay | Admitting: Endocrinology

## 2017-06-30 ENCOUNTER — Encounter: Payer: Self-pay | Admitting: Physical Therapy

## 2017-06-30 ENCOUNTER — Ambulatory Visit: Payer: Medicare Other | Admitting: Physical Therapy

## 2017-06-30 DIAGNOSIS — M545 Low back pain, unspecified: Secondary | ICD-10-CM

## 2017-06-30 DIAGNOSIS — R262 Difficulty in walking, not elsewhere classified: Secondary | ICD-10-CM

## 2017-06-30 DIAGNOSIS — M6281 Muscle weakness (generalized): Secondary | ICD-10-CM | POA: Diagnosis not present

## 2017-06-30 NOTE — Therapy (Signed)
[place], [place], [address] Phone: [phone]   Fax:  [phone]  Physical Therapy Treatment  Patient Details  Name: [name] MRN: [id number] Date of Birth: 1940/08/17 Referring Provider: Toula Moos   Encounter Date: 06/30/2017  PT End of Session - 06/30/17 1228    Visit Number  3    Date for PT Re-Evaluation  08/23/17    PT Start Time  1150    PT Stop Time  6387    PT Time Calculation (min)  55 min    Activity Tolerance  Patient limited by pain;Patient tolerated treatment well    Behavior During Therapy  [facility] for tasks assessed/performed       Past Medical History:  Diagnosis Date  . Acute blood loss anemia   . Acute encephalopathy 07/15/2016  . AKI (acute kidney injury) ([place])   . Anemia    has sickle cell trait  . Anxiety   . Arthritis   . Asthma    has used inhaler in past for asthmatic bronchitis, last time- early 2012  . Bilateral primary osteoarthritis of knee 03/26/2016  . Complication of anesthesia    wakes up shaking  . Diabetes mellitus   . Diabetes mellitus with neuropathy ([place]) 10/29/2012  . Diverticulitis   . Dyslipidemia   . Encephalopathy 06/2016   due to medications after surgery  . Fibromyalgia   . GERD (gastroesophageal reflux disease)    occas. use of  Prilosec  . Heart murmur    sees Dr. Montez Morita, last seen- early 2012  . Herniated nucleus pulposus, L2-3 06/25/2016  . History of back surgery   . Hypertension    02/2010- stress test /w PCP  . Hypothyroidism   . Loose bowel movements 12/2016  . Lumbar radiculopathy 06/27/2016  . Lumbar stenosis with neurogenic claudication 03/17/2017  . Memory disorder 02/16/2016  . Myoclonic jerking   . Neuromuscular disorder (HCC)    lumbar radiculopathy, lumbago  . Nocturnal leg cramps 09/27/2014  . Osteoporosis 03/10/2014  . Pneumonia   . Sickle cell trait ([place])   . Sleep apnea    borderline sleep apnea,  states she no longer uses, early 2012- stopped using   . Spinal stenosis of lumbar region with radiculopathy 02/28/2014  . Spondylolisthesis of lumbar region 03/19/2011  . Type II or unspecified type diabetes mellitus without mention of complication, uncontrolled 07/08/2013    Past Surgical History:  Procedure Laterality Date  . ABDOMINAL HYSTERECTOMY    . adb.cyst     ovarian cyst  . BACK SURGERY     2012, 2015 (3 total)  . BACK SURGERY  2018   01/2017 and 03/17/2017  . COLONOSCOPY    . EYE SURGERY     macular degeneration treatment - injections  . OVARIAN CYST SURGERY    . POSTERIOR LUMBAR FUSION 4 LEVEL N/A 03/17/2017   Procedure: Decompression of Lumbar One-Two with Thoracic Ten to Lumbar Two Fusion;  Surgeon: Kristeen Miss, MD;  Location: [facility];  Service: Neurosurgery;  Laterality: N/A;  Decompression of L1-2 with T10 to L2 Fusion    There were no vitals filed for this visit.  Subjective Assessment - 06/30/17 1151    Subjective  "I feel a lot better than I did last time"    Currently in Pain?  Yes    Pain Score  7     Pain Location  -- back and legs  [facility] Adult PT Treatment/Exercise - 06/30/17 0001      Lumbar Exercises: Aerobic   Nustep  Level 1 x 6 minutes      Lumbar Exercises: Seated   Long Arc Quad on Chair  Both;10 reps;1 set    Other Seated Lumbar Exercises  sit to stand 2x5, airex on mat table UE on knees    Other Seated Lumbar Exercises  HS curls red tband 2x10; Rows red tband 2x10       Modalities   Modalities  Electrical Stimulation;Moist Heat      Moist Heat Therapy   Number Minutes Moist Heat  15 Minutes    Moist Heat Location  Lumbar Spine      Electrical Stimulation   Electrical Stimulation Location  Low back and hip areas    Electrical Stimulation Action  IFC    Electrical Stimulation Parameters  sitting    Electrical Stimulation Goals  Pain               PT Short Term Goals - 06/23/17 1600       PT SHORT TERM GOAL #1   Title  independent with initial HEP    Time  2    Period  Weeks    Status  New        PT Long Term Goals - 06/23/17 1601      PT LONG TERM GOAL #1   Title  decrease pain 50%    Time  8    Period  Weeks    Status  New      PT LONG TERM GOAL #2   Title  get up from sitting without pain and without hands    Time  8    Period  Weeks    Status  New      PT LONG TERM GOAL #3   Title  increase lumbar ROM 25%    Time  8    Period  Weeks    Status  New      PT LONG TERM GOAL #4   Title  walk 1000 feet    Time  8    Period  Weeks    Status  New            Plan - 06/30/17 1229    Clinical Impression Statement  Pt wit a better tolerance to activity compared to last time. She was able to do all of today's activities with little increases pain. She does reports some cramping with LAQ. Increase time needed to complete activities, due to fatigue. She stated that she did not know what was going on last time.     Rehab Potential  Good    PT Frequency  2x / week    PT Duration  8 weeks    PT Treatment/Interventions  ADLs/Self Care Home Management;Cryotherapy;Electrical Stimulation;Moist Heat;Neuromuscular re-education;Therapeutic exercise;Therapeutic activities;Functional mobility training;Stair training;Patient/family education;Manual techniques    PT Next Visit Plan  work on getting patient moving       Patient will benefit from skilled therapeutic intervention in order to improve the following deficits and impairments:  Abnormal gait, Decreased range of motion, Difficulty walking, Decreased endurance, Increased muscle spasms, Decreased activity tolerance, Pain, Impaired flexibility, Improper body mechanics, Postural dysfunction, Decreased strength, Decreased mobility  Visit Diagnosis: Difficulty in walking, not elsewhere classified  Muscle weakness (generalized)  Acute bilateral low back pain without sciatica     Problem List Patient Active  Problem List   Diagnosis Date  Noted  . Acute blood loss as cause of postoperative anemia 03/30/2017  . Diabetic polyneuropathy associated with secondary diabetes mellitus ([place]) 03/30/2017  . Asthma 03/30/2017  . Dyslipidemia 03/26/2017  . Lumbar stenosis with neurogenic claudication 03/17/2017  . Spinal stenosis of lumbar region 03/03/2017  . Myoclonic jerking   . Nausea   . Acute encephalopathy 07/15/2016  . Lumbar radiculopathy 06/27/2016  . Hypothyroidism 06/26/2016  . Labile blood glucose   . Surgery, elective   . Post-operative pain   . Sickle cell trait ([place])   . Acute blood loss anemia   . History of back surgery   . AKI (acute kidney injury) ([facility])   . Herniated nucleus pulposus, L2-3 06/25/2016  . Bilateral primary osteoarthritis of knee 03/26/2016  . Osteoarthritis, hand 03/26/2016  . Other insomnia 03/26/2016  . Memory disorder 02/16/2016  . Fibromyalgia 09/27/2014  . Nocturnal leg cramps 09/27/2014  . Body mass index (BMI) of 30.0-30.9 in adult 08/04/2014  . Neuropathy 03/10/2014  . Allergic rhinitis 03/10/2014  . Osteoporosis 03/10/2014  . Spinal stenosis of lumbar region with radiculopathy 02/28/2014  . Type II or unspecified type diabetes mellitus without mention of complication, uncontrolled 07/08/2013  . Hypokalemia 11/02/2012  . Essential hypertension, benign 11/02/2012  . Diabetes mellitus with neuropathy ([place]) 10/29/2012  . Hyperlipidemia 10/29/2012  . Spondylolisthesis of lumbar region 03/19/2011  . Lumbar radicular pain 03/19/2011    Scot Jun, PTA 06/30/2017, 12:32 PM  [place], [place], [address] Phone: [phone]   Fax:  [phone]  Name: [name] MRN: [id number] Date of Birth: Jul 20, 1940

## 2017-07-03 ENCOUNTER — Ambulatory Visit: Payer: Medicare Other | Admitting: Physical Therapy

## 2017-07-03 DIAGNOSIS — R262 Difficulty in walking, not elsewhere classified: Secondary | ICD-10-CM | POA: Diagnosis not present

## 2017-07-03 DIAGNOSIS — M545 Low back pain, unspecified: Secondary | ICD-10-CM

## 2017-07-03 DIAGNOSIS — M6281 Muscle weakness (generalized): Secondary | ICD-10-CM | POA: Diagnosis not present

## 2017-07-03 NOTE — Therapy (Signed)
[place], [place], [address] Phone: [phone]   Fax:  [phone]  Physical Therapy Treatment  Patient Details  Name: [name] MRN: [id number] Date of Birth: 1940/11/06 Referring Provider: Toula Moos   Encounter Date: 07/03/2017  PT End of Session - 07/03/17 1121    Visit Number  4    Date for PT Re-Evaluation  08/23/17    PT Start Time  1100    PT Stop Time  1155    PT Time Calculation (min)  55 min       Past Medical History:  Diagnosis Date  . Acute blood loss anemia   . Acute encephalopathy 07/15/2016  . AKI (acute kidney injury) ([place])   . Anemia    has sickle cell trait  . Anxiety   . Arthritis   . Asthma    has used inhaler in past for asthmatic bronchitis, last time- early 2012  . Bilateral primary osteoarthritis of knee 03/26/2016  . Complication of anesthesia    wakes up shaking  . Diabetes mellitus   . Diabetes mellitus with neuropathy ([place]) 10/29/2012  . Diverticulitis   . Dyslipidemia   . Encephalopathy 06/2016   due to medications after surgery  . Fibromyalgia   . GERD (gastroesophageal reflux disease)    occas. use of  Prilosec  . Heart murmur    sees Dr. Montez Morita, last seen- early 2012  . Herniated nucleus pulposus, L2-3 06/25/2016  . History of back surgery   . Hypertension    02/2010- stress test /w PCP  . Hypothyroidism   . Loose bowel movements 12/2016  . Lumbar radiculopathy 06/27/2016  . Lumbar stenosis with neurogenic claudication 03/17/2017  . Memory disorder 02/16/2016  . Myoclonic jerking   . Neuromuscular disorder (HCC)    lumbar radiculopathy, lumbago  . Nocturnal leg cramps 09/27/2014  . Osteoporosis 03/10/2014  . Pneumonia   . Sickle cell trait ([place])   . Sleep apnea    borderline sleep apnea, states she no longer uses, early 2012- stopped using   . Spinal stenosis of lumbar region with radiculopathy 02/28/2014  . Spondylolisthesis of  lumbar region 03/19/2011  . Type II or unspecified type diabetes mellitus without mention of complication, uncontrolled 07/08/2013    Past Surgical History:  Procedure Laterality Date  . ABDOMINAL HYSTERECTOMY    . adb.cyst     ovarian cyst  . BACK SURGERY     2012, 2015 (3 total)  . BACK SURGERY  2018   01/2017 and 03/17/2017  . COLONOSCOPY    . EYE SURGERY     macular degeneration treatment - injections  . OVARIAN CYST SURGERY    . POSTERIOR LUMBAR FUSION 4 LEVEL N/A 03/17/2017   Procedure: Decompression of Lumbar One-Two with Thoracic Ten to Lumbar Two Fusion;  Surgeon: Kristeen Miss, MD;  Location: [facility];  Service: Neurosurgery;  Laterality: N/A;  Decompression of L1-2 with T10 to L2 Fusion    There were no vitals filed for this visit.  Subjective Assessment - 07/03/17 1116    Subjective  better, but legs weak and have a catch in my back esp on RT    Currently in Pain?  Yes    Pain Score  7     Pain Location  Back    Pain Orientation  Lower  [facility] Adult PT Treatment/Exercise - 07/03/17 0001      Lumbar Exercises: Aerobic   Nustep  L 2 6 min      Lumbar Exercises: Seated   Long Arc Quad on Chair  Both;10 reps;1 set on sit fit. plus abd and hip flex    Other Seated Lumbar Exercises  sit fit pelvic tilt and SW      Lumbar Exercises: Supine   Ab Set  15 reps with ball    Basic Lumbar Stabilization  Compliant;10 reps;2 seconds multi core stab ex- some assistance needed    Other Supine Lumbar Exercises  KTC and obl with ball 10 each      Moist Heat Therapy   Number Minutes Moist Heat  15 Minutes    Moist Heat Location  Lumbar Spine      Electrical Stimulation   Electrical Stimulation Location  Low back and hip areas    Electrical Stimulation Action  IFC    Electrical Stimulation Parameters  sitting    Electrical Stimulation Goals  Pain      Manual Therapy   Manual Therapy  Passive ROM    Manual therapy comments  gentle RT LE  distraction    Passive ROM  LE and trunk               PT Short Term Goals - 06/23/17 1600      PT SHORT TERM GOAL #1   Title  independent with initial HEP    Time  2    Period  Weeks    Status  New        PT Long Term Goals - 07/03/17 1120      PT LONG TERM GOAL #1   Title  decrease pain 50%    Status  On-going      PT LONG TERM GOAL #2   Title  get up from sitting without pain and without hands    Status  On-going      PT LONG TERM GOAL #3   Title  increase lumbar ROM 25%    Baseline  same as eval -very guarded    Status  On-going      PT LONG TERM GOAL #4   Title  walk 1000 feet    Status  On-going            Plan - 07/03/17 1121    Clinical Impression Statement  pt with a catch in her back when moving from flexion into ext limiting all activities and causing pain. antalgic gait with visual LE weakness. Limited tolerance to ex- assistance and VCing needed    PT Treatment/Interventions  ADLs/Self Care Home Management;Cryotherapy;Electrical Stimulation;Moist Heat;Neuromuscular re-education;Therapeutic exercise;Therapeutic activities;Functional mobility training;Stair training;Patient/family education;Manual techniques    PT Next Visit Plan  work on getting patient moving       Patient will benefit from skilled therapeutic intervention in order to improve the following deficits and impairments:  Abnormal gait, Decreased range of motion, Difficulty walking, Decreased endurance, Increased muscle spasms, Decreased activity tolerance, Pain, Impaired flexibility, Improper body mechanics, Postural dysfunction, Decreased strength, Decreased mobility  Visit Diagnosis: Difficulty in walking, not elsewhere classified  Muscle weakness (generalized)  Acute bilateral low back pain without sciatica     Problem List Patient Active Problem List   Diagnosis Date Noted  . Acute blood loss as cause of postoperative anemia 03/30/2017  . Diabetic polyneuropathy  associated with secondary diabetes mellitus ([place]) 03/30/2017  . Asthma 03/30/2017  .  Dyslipidemia 03/26/2017  . Lumbar stenosis with neurogenic claudication 03/17/2017  . Spinal stenosis of lumbar region 03/03/2017  . Myoclonic jerking   . Nausea   . Acute encephalopathy 07/15/2016  . Lumbar radiculopathy 06/27/2016  . Hypothyroidism 06/26/2016  . Labile blood glucose   . Surgery, elective   . Post-operative pain   . Sickle cell trait ([place])   . Acute blood loss anemia   . History of back surgery   . AKI (acute kidney injury) ([place])   . Herniated nucleus pulposus, L2-3 06/25/2016  . Bilateral primary osteoarthritis of knee 03/26/2016  . Osteoarthritis, hand 03/26/2016  . Other insomnia 03/26/2016  . Memory disorder 02/16/2016  . Fibromyalgia 09/27/2014  . Nocturnal leg cramps 09/27/2014  . Body mass index (BMI) of 30.0-30.9 in adult 08/04/2014  . Neuropathy 03/10/2014  . Allergic rhinitis 03/10/2014  . Osteoporosis 03/10/2014  . Spinal stenosis of lumbar region with radiculopathy 02/28/2014  . Type II or unspecified type diabetes mellitus without mention of complication, uncontrolled 07/08/2013  . Hypokalemia 11/02/2012  . Essential hypertension, benign 11/02/2012  . Diabetes mellitus with neuropathy ([place]) 10/29/2012  . Hyperlipidemia 10/29/2012  . Spondylolisthesis of lumbar region 03/19/2011  . Lumbar radicular pain 03/19/2011    Latrelle Fuston,ANGIE PTA 07/03/2017, 11:25 AM  [place] [facility] [place], [place], [address] Phone: [phone]   Fax:  [phone]  Name: [name] MRN: [id number] Date of Birth: [birth year]

## 2017-07-07 ENCOUNTER — Ambulatory Visit: Payer: Medicare Other | Admitting: Physical Therapy

## 2017-07-07 ENCOUNTER — Encounter: Payer: Self-pay | Admitting: Physical Therapy

## 2017-07-07 DIAGNOSIS — M6281 Muscle weakness (generalized): Secondary | ICD-10-CM | POA: Diagnosis not present

## 2017-07-07 DIAGNOSIS — M545 Low back pain, unspecified: Secondary | ICD-10-CM

## 2017-07-07 DIAGNOSIS — R262 Difficulty in walking, not elsewhere classified: Secondary | ICD-10-CM | POA: Diagnosis not present

## 2017-07-07 NOTE — Therapy (Signed)
[place], [place], [address] Phone: [phone]   Fax:  [phone]  Physical Therapy Treatment  Patient Details  Name: [name] MRN: [id number] Date of Birth: January 01, 1941 Referring Provider: Toula Moos   Encounter Date: 07/07/2017  PT End of Session - 07/07/17 1230    Visit Number  5    Date for PT Re-Evaluation  08/23/17    PT Start Time  7494    PT Stop Time  4967    PT Time Calculation (min)  54 min    Activity Tolerance  Patient limited by pain;Patient tolerated treatment well    Behavior During Therapy  [facility] for tasks assessed/performed       Past Medical History:  Diagnosis Date  . Acute blood loss anemia   . Acute encephalopathy 07/15/2016  . AKI (acute kidney injury) ([place])   . Anemia    has sickle cell trait  . Anxiety   . Arthritis   . Asthma    has used inhaler in past for asthmatic bronchitis, last time- early 2012  . Bilateral primary osteoarthritis of knee 03/26/2016  . Complication of anesthesia    wakes up shaking  . Diabetes mellitus   . Diabetes mellitus with neuropathy ([place]) 10/29/2012  . Diverticulitis   . Dyslipidemia   . Encephalopathy 06/2016   due to medications after surgery  . Fibromyalgia   . GERD (gastroesophageal reflux disease)    occas. use of  Prilosec  . Heart murmur    sees Dr. Montez Morita, last seen- early 2012  . Herniated nucleus pulposus, L2-3 06/25/2016  . History of back surgery   . Hypertension    02/2010- stress test /w PCP  . Hypothyroidism   . Loose bowel movements 12/2016  . Lumbar radiculopathy 06/27/2016  . Lumbar stenosis with neurogenic claudication 03/17/2017  . Memory disorder 02/16/2016  . Myoclonic jerking   . Neuromuscular disorder (HCC)    lumbar radiculopathy, lumbago  . Nocturnal leg cramps 09/27/2014  . Osteoporosis 03/10/2014  . Pneumonia   . Sickle cell trait ([place])   . Sleep apnea    borderline sleep apnea,  states she no longer uses, early 2012- stopped using   . Spinal stenosis of lumbar region with radiculopathy 02/28/2014  . Spondylolisthesis of lumbar region 03/19/2011  . Type II or unspecified type diabetes mellitus without mention of complication, uncontrolled 07/08/2013    Past Surgical History:  Procedure Laterality Date  . ABDOMINAL HYSTERECTOMY    . adb.cyst     ovarian cyst  . BACK SURGERY     2012, 2015 (3 total)  . BACK SURGERY  2018   01/2017 and 03/17/2017  . COLONOSCOPY    . EYE SURGERY     macular degeneration treatment - injections  . OVARIAN CYST SURGERY    . POSTERIOR LUMBAR FUSION 4 LEVEL N/A 03/17/2017   Procedure: Decompression of Lumbar One-Two with Thoracic Ten to Lumbar Two Fusion;  Surgeon: Kristeen Miss, MD;  Location: [facility];  Service: Neurosurgery;  Laterality: N/A;  Decompression of L1-2 with T10 to L2 Fusion    There were no vitals filed for this visit.  Subjective Assessment - 07/07/17 1153    Subjective  "Im just here, I hurt all the time"    Currently in Pain?  Yes    Pain Score  8     Pain Location  Leg    Pain Orientation  Left;Right  [facility] Adult PT Treatment/Exercise - 07/07/17 0001      Lumbar Exercises: Aerobic   Nustep  L 2 7 min      Lumbar Exercises: Standing   Other Standing Lumbar Exercises  standing march HHA x2 2x10       Lumbar Exercises: Seated   Long Arc Quad on Chair  Both;10 reps;2 sets;Weights    LAQ on Chair Weights (lbs)  3    Other Seated Lumbar Exercises  3x5 sit to stand airex on mat table UE on LE     Other Seated Lumbar Exercises  HS curls green tband 2x10; Rows red tband 2x10       Moist Heat Therapy   Number Minutes Moist Heat  15 Minutes    Moist Heat Location  Lumbar Spine      Electrical Stimulation   Electrical Stimulation Location  Low back and hip areas    Electrical Stimulation Action  IFC    Electrical Stimulation Parameters  sitting    Electrical Stimulation Goals   Pain               PT Short Term Goals - 06/23/17 1600      PT SHORT TERM GOAL #1   Title  independent with initial HEP    Time  2    Period  Weeks    Status  New        PT Long Term Goals - 07/03/17 1120      PT LONG TERM GOAL #1   Title  decrease pain 50%    Status  On-going      PT LONG TERM GOAL #2   Title  get up from sitting without pain and without hands    Status  On-going      PT LONG TERM GOAL #3   Title  increase lumbar ROM 25%    Baseline  same as eval -very guarded    Status  On-going      PT LONG TERM GOAL #4   Title  walk 1000 feet    Status  On-going            Plan - 07/07/17 1230    Clinical Impression Statement  Pt reports that her back does not hurt but her legs are really weak. Good strength demo ed with today's activities LLE appears to be stronger than R. Fatigues quick with standing march, cues provided to lift LE's higher    Rehab Potential  Good    PT Frequency  2x / week    PT Duration  8 weeks    PT Treatment/Interventions  ADLs/Self Care Home Management;Cryotherapy;Electrical Stimulation;Moist Heat;Neuromuscular re-education;Therapeutic exercise;Therapeutic activities;Functional mobility training;Stair training;Patient/family education;Manual techniques    PT Next Visit Plan  work on getting patient moving       Patient will benefit from skilled therapeutic intervention in order to improve the following deficits and impairments:  Abnormal gait, Decreased range of motion, Difficulty walking, Decreased endurance, Increased muscle spasms, Decreased activity tolerance, Pain, Impaired flexibility, Improper body mechanics, Postural dysfunction, Decreased strength, Decreased mobility  Visit Diagnosis: Muscle weakness (generalized)  Difficulty in walking, not elsewhere classified  Acute bilateral low back pain without sciatica     Problem List Patient Active Problem List   Diagnosis Date Noted  . Acute blood loss as cause  of postoperative anemia 03/30/2017  . Diabetic polyneuropathy associated with secondary diabetes mellitus ([place]) 03/30/2017  . Asthma 03/30/2017  . Dyslipidemia 03/26/2017  . Lumbar stenosis  with neurogenic claudication 03/17/2017  . Spinal stenosis of lumbar region 03/03/2017  . Myoclonic jerking   . Nausea   . Acute encephalopathy 07/15/2016  . Lumbar radiculopathy 06/27/2016  . Hypothyroidism 06/26/2016  . Labile blood glucose   . Surgery, elective   . Post-operative pain   . Sickle cell trait ([place])   . Acute blood loss anemia   . History of back surgery   . AKI (acute kidney injury) ([place])   . Herniated nucleus pulposus, L2-3 06/25/2016  . Bilateral primary osteoarthritis of knee 03/26/2016  . Osteoarthritis, hand 03/26/2016  . Other insomnia 03/26/2016  . Memory disorder 02/16/2016  . Fibromyalgia 09/27/2014  . Nocturnal leg cramps 09/27/2014  . Body mass index (BMI) of 30.0-30.9 in adult 08/04/2014  . Neuropathy 03/10/2014  . Allergic rhinitis 03/10/2014  . Osteoporosis 03/10/2014  . Spinal stenosis of lumbar region with radiculopathy 02/28/2014  . Type II or unspecified type diabetes mellitus without mention of complication, uncontrolled 07/08/2013  . Hypokalemia 11/02/2012  . Essential hypertension, benign 11/02/2012  . Diabetes mellitus with neuropathy ([place]) 10/29/2012  . Hyperlipidemia 10/29/2012  . Spondylolisthesis of lumbar region 03/19/2011  . Lumbar radicular pain 03/19/2011    Scot Jun, PTA 07/07/2017, 12:33 PM  [place], [place], [address] Phone: [phone]   Fax:  [phone]  Name: [name] MRN: [id number] Date of Birth: [birth year]

## 2017-07-08 ENCOUNTER — Ambulatory Visit (INDEPENDENT_AMBULATORY_CARE_PROVIDER_SITE_OTHER): Payer: Medicare Other | Admitting: Neurology

## 2017-07-08 ENCOUNTER — Other Ambulatory Visit: Payer: Self-pay

## 2017-07-08 ENCOUNTER — Encounter: Payer: Self-pay | Admitting: Neurology

## 2017-07-08 VITALS — BP 135/75 | HR 87 | Ht 65.0 in | Wt 178.0 lb

## 2017-07-08 DIAGNOSIS — G4762 Sleep related leg cramps: Secondary | ICD-10-CM | POA: Diagnosis not present

## 2017-07-08 DIAGNOSIS — R413 Other amnesia: Secondary | ICD-10-CM

## 2017-07-08 DIAGNOSIS — M797 Fibromyalgia: Secondary | ICD-10-CM

## 2017-07-08 DIAGNOSIS — R519 Headache, unspecified: Secondary | ICD-10-CM

## 2017-07-08 DIAGNOSIS — R51 Headache: Secondary | ICD-10-CM

## 2017-07-08 MED ORDER — GABAPENTIN 300 MG PO CAPS: ORAL_CAPSULE | ORAL | 4 refills | Status: DC

## 2017-07-08 NOTE — Progress Notes (Signed)
Reason for visit: Fibromyalgia  [name] is an 77 y.o. female  History of present illness:  [name] is a 77 year old right-handed black female with a history of diabetes and fibromyalgia.  The patient complains of nocturnal leg cramps, she was treated with baclofen but she claims this results in polymyoclonus.  The patient is on gabapentin taking 300 mg 3 times daily.  She is having ongoing discomfort all over her body, she is now having headaches that started about 3 or 4 months ago.  The patient claims that she never has had a prior history of headaches.  She has noted some tremors in the arms, right greater than left.  She is concerned about Parkinson's disease.  She is not sleeping at night, she has low energy during the day.  She feels achy throughout.  The patient complained of memory problems when she was seen last, but she now indicates that she does not have any problems with memory.  She has stopped taking the Aricept.  She is in physical therapy at this time.  She returns for an evaluation.  Past Medical History:  Diagnosis Date  . Acute blood loss anemia   . Acute encephalopathy 07/15/2016  . AKI (acute kidney injury) ([place])   . Anemia    has sickle cell trait  . Anxiety   . Arthritis   . Asthma    has used inhaler in past for asthmatic bronchitis, last time- early 2012  . Bilateral primary osteoarthritis of knee 03/26/2016  . Complication of anesthesia    wakes up shaking  . Diabetes mellitus   . Diabetes mellitus with neuropathy ([place]) 10/29/2012  . Diverticulitis   . Dyslipidemia   . Encephalopathy 06/2016   due to medications after surgery  . Fibromyalgia   . GERD (gastroesophageal reflux disease)    occas. use of  Prilosec  . Heart murmur    sees Dr. Montez Morita, last seen- early 2012  . Herniated nucleus pulposus, L2-3 06/25/2016  . History of back surgery   . Hypertension    02/2010- stress test /w PCP  . Hypothyroidism   . Loose bowel movements 12/2016    . Lumbar radiculopathy 06/27/2016  . Lumbar stenosis with neurogenic claudication 03/17/2017  . Memory disorder 02/16/2016  . Myoclonic jerking   . Neuromuscular disorder (HCC)    lumbar radiculopathy, lumbago  . Nocturnal leg cramps 09/27/2014  . Osteoporosis 03/10/2014  . Pneumonia   . Sickle cell trait ([place])   . Sleep apnea    borderline sleep apnea, states she no longer uses, early 2012- stopped using   . Spinal stenosis of lumbar region with radiculopathy 02/28/2014  . Spondylolisthesis of lumbar region 03/19/2011  . Type II or unspecified type diabetes mellitus without mention of complication, uncontrolled 07/08/2013    Past Surgical History:  Procedure Laterality Date  . ABDOMINAL HYSTERECTOMY    . adb.cyst     ovarian cyst  . BACK SURGERY     2012, 2015 (3 total)  . BACK SURGERY  2018   01/2017 and 03/17/2017  . COLONOSCOPY    . EYE SURGERY     macular degeneration treatment - injections  . OVARIAN CYST SURGERY    . POSTERIOR LUMBAR FUSION 4 LEVEL N/A 03/17/2017   Procedure: Decompression of Lumbar One-Two with Thoracic Ten to Lumbar Two Fusion;  Surgeon: Kristeen Miss, MD;  Location: [facility];  Service: Neurosurgery;  Laterality: N/A;  Decompression of L1-2 with T10 to L2  Fusion    Family History  Problem Relation Age of Onset  . Ovarian cancer Mother   . Cancer - Prostate Father   . Breast cancer Paternal Aunt   . Multiple myeloma Paternal Aunt   . Anesthesia problems Neg Hx   . Hypotension Neg Hx   . Malignant hyperthermia Neg Hx   . Pseudochol deficiency Neg Hx     Social history:  reports that she quit smoking about 18 years ago. she has never used smokeless tobacco. She reports that she drinks alcohol. She reports that she does not use drugs.    Allergies  Allergen Reactions  . Cymbalta [Duloxetine Hcl] Diarrhea and Other (See Comments)    Dizziness, headache, irritability  . Baclofen     jerks   . Betadine [Povidone Iodine] Swelling and Other (See  Comments)    SWELLING REACTION DESCRIPTION/SEVERITY UNSPECIFIED  Reaction to betadine eye drops  . Adhesive [Tape] Rash  . Lipitor [Atorvastatin] Rash    Medications:  Prior to Admission medications   Medication Sig Start Date End Date Taking? Authorizing Provider  acetaminophen (TYLENOL) 500 MG tablet Take 1,000 mg by mouth every 8 (eight) hours as needed for mild pain or moderate pain.    Yes [provider]  aspirin 81 MG EC tablet Take 82m by mouth once daily 01/16/15  Yes [provider]  Calcium-Magnesium-Zinc (CAL-MAG-ZINC PO) Take 1 tablet by mouth daily.   Yes [provider]  cetirizine (ZYRTEC) 10 MG tablet Take 10 mg by mouth daily.   Yes [provider]  fluticasone (FLONASE) 50 MCG/ACT nasal spray Place 1 spray into both nostrils daily.   Yes [provider]  Fluticasone Furoate (ARNUITY ELLIPTA) 100 MCG/ACT AEPB Inhale 1 puff into the lungs daily. 10/01/16  Yes Kozlow, EDonnamarie Poag MD  gabapentin (NEURONTIN) 300 MG capsule One capsule in the morning and noon, take 2 at night 07/08/17  Yes WKathrynn Ducking MD  ibuprofen (ADVIL,MOTRIN) 800 MG tablet Take 800 mg every 8 (eight) hours as needed by mouth for mild pain or moderate pain.  08/09/16  Yes [provider]  insulin degludec (TRESIBA FLEXTOUCH) 100 UNIT/ML SOPN FlexTouch Pen Inject into the skin. Inject 24 units daily for DM   Yes [provider]  levothyroxine (SYNTHROID, LEVOTHROID) 25 MCG tablet TAKE 1 TABLET(25 MCG) BY MOUTH DAILY BEFORE BREAKFAST 02/20/17  Yes KElayne Snare MD  MAGNESIUM PO Take 250 mg by mouth daily.   Yes [provider]  NOVOLOG FLEXPEN 100 UNIT/ML FlexPen INJECT 6 UNITS UNDER THE SKIN BEFORE BREAKFAST, 10 UNITS BEFORE LUNCH, AND 9 UNITS BEFORE SUPPER 06/30/17  Yes KElayne Snare MD  potassium chloride (K-DUR,KLOR-CON) 10 MEQ tablet TAKE 2 TABLETS(20 MEQ) BY MOUTH TWICE DAILY 01/27/17  Yes KElayne Snare MD  PROAIR [facility] [phone])  MCG/ACT AEPB Inhale 2 puffs into the lungs 3 (three) times daily as needed (shortness of breath).  05/29/16  Yes [provider]  pyridoxine (B-6) 100 MG tablet Take 100 mg daily by mouth.   Yes [provider]  SYMBICORT 80-4.5 MCG/ACT inhaler Inhale 2 puffs into the lungs 2 (two) times daily as needed (shortness of breath).  05/29/16  Yes [provider]  vitamin B-12 (CYANOCOBALAMIN) 100 MCG tablet Take 100 mcg daily by mouth.    Yes [provider]  vitamin E 400 UNIT capsule Take 400 Units by mouth daily.   Yes [provider]    ROS:  Out of a  complete 14 system review of symptoms, the patient complains only of the following symptoms, and all other reviewed systems are negative.  Chills, fatigue Difficulty swallowing Cough, wheezing Heart murmur Excessive eating Frequent waking, daytime sleepiness Incontinence of the bladder, urinary urgency Muscle cramps, walking difficulty Bruising easily Headache, weakness, tremors  Blood pressure 135/75, pulse 87, height '5\' 5"'  (1.651 m), weight 178 lb (80.7 kg).  Physical Exam  General: The patient is alert and cooperative at the time of the examination.  The patient is moderately obese.  Skin: No significant peripheral edema is noted.   Neurologic Exam  Mental status: The patient is alert and oriented x 3 at the time of the examination. The patient has apparent normal recent and remote memory, with an apparently normal attention span and concentration ability.   Cranial nerves: Facial symmetry is present. Speech is normal, no aphasia or dysarthria is noted. Extraocular movements are full. Visual fields are full.  Motor: The patient has good strength in all 4 extremities.  Sensory examination: Soft touch sensation is symmetric on the face, arms, and legs.  Coordination: The patient has good finger-nose-finger and heel-to-shin bilaterally.  Gait and station: The patient has a slightly  wide-based gait, the patient usually uses a cane for ambulation. Tandem gait is slightly unsteady. Romberg is slightly unsteady. No drift is seen.  Reflexes: Deep tendon reflexes are symmetric.   Assessment/Plan:  1.  Chronic pain syndrome, fibromyalgia  2.  New onset headache  The patient will be sent for blood work today.  She will have an increase in her gabapentin dose for fibromyalgia, headache, and for insomnia.  The patient will go to 300 mg of gabapentin twice during the day and 600 mg at night.  She will call for any dose adjustments.  She will follow-up in 6 months.   Jill Alexanders MD 07/08/2017 1:57 PM  [facility] [address] [place], [place] [address]  Phone [phone] Fax [phone]

## 2017-07-08 NOTE — Patient Instructions (Signed)
   We will go up on the gabapentin taking one twice during the day and 2 at night.  Neurontin (gabapentin) may result in drowsiness, ankle swelling, gait instability, or possibly dizziness. Please contact our office if significant side effects occur with this medication.

## 2017-07-09 LAB — SEDIMENTATION RATE: Sed Rate: 34 mm/hr (ref 0–40)

## 2017-07-10 ENCOUNTER — Encounter: Payer: Self-pay | Admitting: Physical Therapy

## 2017-07-10 ENCOUNTER — Ambulatory Visit: Payer: Medicare Other | Admitting: Physical Therapy

## 2017-07-10 DIAGNOSIS — R262 Difficulty in walking, not elsewhere classified: Secondary | ICD-10-CM

## 2017-07-10 DIAGNOSIS — M545 Low back pain, unspecified: Secondary | ICD-10-CM

## 2017-07-10 DIAGNOSIS — M6281 Muscle weakness (generalized): Secondary | ICD-10-CM

## 2017-07-10 NOTE — Therapy (Signed)
[place], [place], [address] Phone: [phone]   Fax:  [phone]  Physical Therapy Treatment  Patient Details  Name: [name] MRN: [id number] Date of Birth: May 06, 1940 Referring Provider: Toula Moos   Encounter Date: 07/10/2017  PT End of Session - 07/10/17 1145    Visit Number  6    Date for PT Re-Evaluation  08/23/17    PT Start Time  1100    PT Stop Time  1200    PT Time Calculation (min)  60 min    Activity Tolerance  Patient tolerated treatment well    Behavior During Therapy  [facility] for tasks assessed/performed       Past Medical History:  Diagnosis Date  . Acute blood loss anemia   . Acute encephalopathy 07/15/2016  . AKI (acute kidney injury) ([place])   . Anemia    has sickle cell trait  . Anxiety   . Arthritis   . Asthma    has used inhaler in past for asthmatic bronchitis, last time- early 2012  . Bilateral primary osteoarthritis of knee 03/26/2016  . Complication of anesthesia    wakes up shaking  . Diabetes mellitus   . Diabetes mellitus with neuropathy ([place]) 10/29/2012  . Diverticulitis   . Dyslipidemia   . Encephalopathy 06/2016   due to medications after surgery  . Fibromyalgia   . GERD (gastroesophageal reflux disease)    occas. use of  Prilosec  . Heart murmur    sees Dr. Montez Morita, last seen- early 2012  . Herniated nucleus pulposus, L2-3 06/25/2016  . History of back surgery   . Hypertension    02/2010- stress test /w PCP  . Hypothyroidism   . Loose bowel movements 12/2016  . Lumbar radiculopathy 06/27/2016  . Lumbar stenosis with neurogenic claudication 03/17/2017  . Memory disorder 02/16/2016  . Myoclonic jerking   . Neuromuscular disorder (HCC)    lumbar radiculopathy, lumbago  . Nocturnal leg cramps 09/27/2014  . Osteoporosis 03/10/2014  . Pneumonia   . Sickle cell trait ([place])   . Sleep apnea    borderline sleep apnea, states she no longer uses,  early 2012- stopped using   . Spinal stenosis of lumbar region with radiculopathy 02/28/2014  . Spondylolisthesis of lumbar region 03/19/2011  . Type II or unspecified type diabetes mellitus without mention of complication, uncontrolled 07/08/2013    Past Surgical History:  Procedure Laterality Date  . ABDOMINAL HYSTERECTOMY    . adb.cyst     ovarian cyst  . BACK SURGERY     2012, 2015 (3 total)  . BACK SURGERY  2018   01/2017 and 03/17/2017  . COLONOSCOPY    . EYE SURGERY     macular degeneration treatment - injections  . OVARIAN CYST SURGERY    . POSTERIOR LUMBAR FUSION 4 LEVEL N/A 03/17/2017   Procedure: Decompression of Lumbar One-Two with Thoracic Ten to Lumbar Two Fusion;  Surgeon: Kristeen Miss, MD;  Location: [facility];  Service: Neurosurgery;  Laterality: N/A;  Decompression of L1-2 with T10 to L2 Fusion    There were no vitals filed for this visit.  Subjective Assessment - 07/10/17 1053    Subjective  "Im better, but it is still hard"    Currently in Pain?  Yes    Pain Score  7     Pain Location  Leg    Pain Orientation  Right;Left    Pain Descriptors /  Indicators  Aching                      [facility] Adult PT Treatment/Exercise - 07/10/17 0001      Lumbar Exercises: Aerobic   Nustep  L 5 7 min      Lumbar Exercises: Standing   Row  10 reps;Theraband;Both;Strengthening x2    Theraband Level (Row)  Level 3 (Green)      Lumbar Exercises: Seated   Long Arc Quad on Chair  Both;10 reps;2 sets;Weights    LAQ on Chair Weights (lbs)  3    Sit to Stand  10 reps x2 airex on mat tale    Other Seated Lumbar Exercises  Ab sets 2x10    Other Seated Lumbar Exercises  HS curls green tband 2x10      Moist Heat Therapy   Number Minutes Moist Heat  15 Minutes    Moist Heat Location  Lumbar Spine      Electrical Stimulation   Electrical Stimulation Location  Low back and hip areas    Electrical Stimulation Action  IFC    Electrical Stimulation Parameters  sitting     Electrical Stimulation Goals  Pain               PT Short Term Goals - 06/23/17 1600      PT SHORT TERM GOAL #1   Title  independent with initial HEP    Time  2    Period  Weeks    Status  New        PT Long Term Goals - 07/03/17 1120      PT LONG TERM GOAL #1   Title  decrease pain 50%    Status  On-going      PT LONG TERM GOAL #2   Title  get up from sitting without pain and without hands    Status  On-going      PT LONG TERM GOAL #3   Title  increase lumbar ROM 25%    Baseline  same as eval -very guarded    Status  On-going      PT LONG TERM GOAL #4   Title  walk 1000 feet    Status  On-going            Plan - 07/10/17 1145    Clinical Impression Statement  Pt with an increase I activity tolerance. More interventions in standing positions. Pt ambulated around clinic with AD. Soreness in R buttocks that goes down the R leg.    Rehab Potential  Good    PT Treatment/Interventions  ADLs/Self Care Home Management;Cryotherapy;Electrical Stimulation;Moist Heat;Neuromuscular re-education;Therapeutic exercise;Therapeutic activities;Functional mobility training;Stair training;Patient/family education;Manual techniques    PT Next Visit Plan  work on getting patient moving       Patient will benefit from skilled therapeutic intervention in order to improve the following deficits and impairments:  Abnormal gait, Decreased range of motion, Difficulty walking, Decreased endurance, Increased muscle spasms, Decreased activity tolerance, Pain, Impaired flexibility, Improper body mechanics, Postural dysfunction, Decreased strength, Decreased mobility  Visit Diagnosis: Difficulty in walking, not elsewhere classified  Muscle weakness (generalized)  Acute bilateral low back pain without sciatica     Problem List Patient Active Problem List   Diagnosis Date Noted  . Acute blood loss as cause of postoperative anemia 03/30/2017  . Diabetic polyneuropathy  associated with secondary diabetes mellitus ([place]) 03/30/2017  . Asthma 03/30/2017  . Dyslipidemia 03/26/2017  . Lumbar stenosis  with neurogenic claudication 03/17/2017  . Spinal stenosis of lumbar region 03/03/2017  . Myoclonic jerking   . Nausea   . Acute encephalopathy 07/15/2016  . Lumbar radiculopathy 06/27/2016  . Hypothyroidism 06/26/2016  . Labile blood glucose   . Surgery, elective   . Post-operative pain   . Sickle cell trait ([place])   . Acute blood loss anemia   . History of back surgery   . AKI (acute kidney injury) ([place])   . Herniated nucleus pulposus, L2-3 06/25/2016  . Bilateral primary osteoarthritis of knee 03/26/2016  . Osteoarthritis, hand 03/26/2016  . Other insomnia 03/26/2016  . Memory disorder 02/16/2016  . Fibromyalgia 09/27/2014  . Nocturnal leg cramps 09/27/2014  . Body mass index (BMI) of 30.0-30.9 in adult 08/04/2014  . Neuropathy 03/10/2014  . Allergic rhinitis 03/10/2014  . Osteoporosis 03/10/2014  . Spinal stenosis of lumbar region with radiculopathy 02/28/2014  . Type II or unspecified type diabetes mellitus without mention of complication, uncontrolled 07/08/2013  . Hypokalemia 11/02/2012  . Essential hypertension, benign 11/02/2012  . Diabetes mellitus with neuropathy ([place]) 10/29/2012  . Hyperlipidemia 10/29/2012  . Spondylolisthesis of lumbar region 03/19/2011  . Lumbar radicular pain 03/19/2011    Scot Jun, PTA 07/10/2017, 11:57 AM  [place], [place], [address] Phone: [phone]   Fax:  [phone]  Name: INIS BORNEMAN MRN: [id number] Date of Birth: 27-Sep-1940

## 2017-07-11 ENCOUNTER — Encounter: Payer: Self-pay | Admitting: Infectious Diseases

## 2017-07-11 DIAGNOSIS — E039 Hypothyroidism, unspecified: Secondary | ICD-10-CM | POA: Diagnosis not present

## 2017-07-11 DIAGNOSIS — R748 Abnormal levels of other serum enzymes: Secondary | ICD-10-CM | POA: Diagnosis not present

## 2017-07-11 DIAGNOSIS — I10 Essential (primary) hypertension: Secondary | ICD-10-CM | POA: Diagnosis not present

## 2017-07-11 DIAGNOSIS — M79604 Pain in right leg: Secondary | ICD-10-CM | POA: Diagnosis not present

## 2017-07-11 DIAGNOSIS — R252 Cramp and spasm: Secondary | ICD-10-CM | POA: Diagnosis not present

## 2017-07-11 DIAGNOSIS — Z1389 Encounter for screening for other disorder: Secondary | ICD-10-CM | POA: Diagnosis not present

## 2017-07-14 ENCOUNTER — Ambulatory Visit: Payer: Medicare Other | Admitting: Physical Therapy

## 2017-07-14 ENCOUNTER — Encounter: Payer: Self-pay | Admitting: Physical Therapy

## 2017-07-14 DIAGNOSIS — R262 Difficulty in walking, not elsewhere classified: Secondary | ICD-10-CM

## 2017-07-14 DIAGNOSIS — M545 Low back pain, unspecified: Secondary | ICD-10-CM

## 2017-07-14 DIAGNOSIS — M6281 Muscle weakness (generalized): Secondary | ICD-10-CM

## 2017-07-14 NOTE — Therapy (Signed)
[place], [place], [address] Phone: [phone]   Fax:  [phone]  Physical Therapy Treatment  Patient Details  Name: [name] MRN: [id number] Date of Birth: [birth year] Referring Provider: Toula Moos   Encounter Date: 07/14/2017  PT End of Session - 07/14/17 1231    Visit Number  7    Date for PT Re-Evaluation  08/23/17    PT Start Time  1151    PT Stop Time  1245    PT Time Calculation (min)  54 min    Activity Tolerance  Patient tolerated treatment well    Behavior During Therapy  [facility] for tasks assessed/performed       Past Medical History:  Diagnosis Date  . Acute blood loss anemia   . Acute encephalopathy 07/15/2016  . AKI (acute kidney injury) ([place])   . Anemia    has sickle cell trait  . Anxiety   . Arthritis   . Asthma    has used inhaler in past for asthmatic bronchitis, last time- early 2012  . Bilateral primary osteoarthritis of knee 03/26/2016  . Complication of anesthesia    wakes up shaking  . Diabetes mellitus   . Diabetes mellitus with neuropathy ([place]) 10/29/2012  . Diverticulitis   . Dyslipidemia   . Encephalopathy 06/2016   due to medications after surgery  . Fibromyalgia   . GERD (gastroesophageal reflux disease)    occas. use of  Prilosec  . Heart murmur    sees Dr. Montez Morita, last seen- early 2012  . Herniated nucleus pulposus, L2-3 06/25/2016  . History of back surgery   . Hypertension    02/2010- stress test /w PCP  . Hypothyroidism   . Loose bowel movements 12/2016  . Lumbar radiculopathy 06/27/2016  . Lumbar stenosis with neurogenic claudication 03/17/2017  . Memory disorder 02/16/2016  . Myoclonic jerking   . Neuromuscular disorder (HCC)    lumbar radiculopathy, lumbago  . Nocturnal leg cramps 09/27/2014  . Osteoporosis 03/10/2014  . Pneumonia   . Sickle cell trait ([place])   . Sleep apnea    borderline sleep apnea, states she no longer uses,  early 2012- stopped using   . Spinal stenosis of lumbar region with radiculopathy 02/28/2014  . Spondylolisthesis of lumbar region 03/19/2011  . Type II or unspecified type diabetes mellitus without mention of complication, uncontrolled 07/08/2013    Past Surgical History:  Procedure Laterality Date  . ABDOMINAL HYSTERECTOMY    . adb.cyst     ovarian cyst  . BACK SURGERY     2012, 2015 (3 total)  . BACK SURGERY  2018   01/2017 and 03/17/2017  . COLONOSCOPY    . EYE SURGERY     macular degeneration treatment - injections  . OVARIAN CYST SURGERY    . POSTERIOR LUMBAR FUSION 4 LEVEL N/A 03/17/2017   Procedure: Decompression of Lumbar One-Two with Thoracic Ten to Lumbar Two Fusion;  Surgeon: Kristeen Miss, MD;  Location: [facility];  Service: Neurosurgery;  Laterality: N/A;  Decompression of L1-2 with T10 to L2 Fusion    There were no vitals filed for this visit.  Subjective Assessment - 07/14/17 1154    Subjective  "I have a little hay fever, so these blooms gives me some asthma, but Im all right"    Currently in Pain?  Yes    Pain Score  6     Pain Location  Back  No data recorded       [facility] Adult PT Treatment/Exercise - 07/14/17 0001      Lumbar Exercises: Aerobic   Nustep  L 5 7 min      Lumbar Exercises: Machines for Strengthening   Cybex Knee Extension  5lb x10 x5    Cybex Knee Flexion  20lb x10 x5    Other Lumbar Machine Exercise  15lb rows and lats 2x10       Lumbar Exercises: Seated   Sit to Stand  10 reps x2, first set with airex on mat able       Moist Heat Therapy   Number Minutes Moist Heat  15 Minutes    Moist Heat Location  Lumbar Spine      Electrical Stimulation   Electrical Stimulation Location  Low back and hip areas    Electrical Stimulation Action  IFC    Electrical Stimulation Parameters  sitting    Electrical Stimulation Goals  Pain               PT Short Term Goals - 06/23/17 1600      PT SHORT TERM GOAL #1    Title  independent with initial HEP    Time  2    Period  Weeks    Status  New        PT Long Term Goals - 07/03/17 1120      PT LONG TERM GOAL #1   Title  decrease pain 50%    Status  On-going      PT LONG TERM GOAL #2   Title  get up from sitting without pain and without hands    Status  On-going      PT LONG TERM GOAL #3   Title  increase lumbar ROM 25%    Baseline  same as eval -very guarded    Status  On-going      PT LONG TERM GOAL #4   Title  walk 1000 feet    Status  On-going            Plan - 07/14/17 1231    Clinical Impression Statement  More advance treatment with machine and functional training. No reports of pian during today's exercises. Pt reports she feels a little better overall. no cramps reported during today's exercises.    Rehab Potential  Good    PT Frequency  2x / week    PT Treatment/Interventions  ADLs/Self Care Home Management;Cryotherapy;Electrical Stimulation;Moist Heat;Neuromuscular re-education;Therapeutic exercise;Therapeutic activities;Functional mobility training;Stair training;Patient/family education;Manual techniques    PT Next Visit Plan  work on getting patient moving       Patient will benefit from skilled therapeutic intervention in order to improve the following deficits and impairments:  Abnormal gait, Decreased range of motion, Difficulty walking, Decreased endurance, Increased muscle spasms, Decreased activity tolerance, Pain, Impaired flexibility, Improper body mechanics, Postural dysfunction, Decreased strength, Decreased mobility  Visit Diagnosis: Difficulty in walking, not elsewhere classified  Muscle weakness (generalized)  Acute bilateral low back pain without sciatica     Problem List Patient Active Problem List   Diagnosis Date Noted  . Acute blood loss as cause of postoperative anemia 03/30/2017  . Diabetic polyneuropathy associated with secondary diabetes mellitus ([place]) 03/30/2017  . Asthma  03/30/2017  . Dyslipidemia 03/26/2017  . Lumbar stenosis with neurogenic claudication 03/17/2017  . Spinal stenosis of lumbar region 03/03/2017  . Myoclonic jerking   . Nausea   . Acute encephalopathy 07/15/2016  . Lumbar  radiculopathy 06/27/2016  . Hypothyroidism 06/26/2016  . Labile blood glucose   . Surgery, elective   . Post-operative pain   . Sickle cell trait ([place])   . Acute blood loss anemia   . History of back surgery   . AKI (acute kidney injury) ([place])   . Herniated nucleus pulposus, L2-3 06/25/2016  . Bilateral primary osteoarthritis of knee 03/26/2016  . Osteoarthritis, hand 03/26/2016  . Other insomnia 03/26/2016  . Memory disorder 02/16/2016  . Fibromyalgia 09/27/2014  . Nocturnal leg cramps 09/27/2014  . Body mass index (BMI) of 30.0-30.9 in adult 08/04/2014  . Neuropathy 03/10/2014  . Allergic rhinitis 03/10/2014  . Osteoporosis 03/10/2014  . Spinal stenosis of lumbar region with radiculopathy 02/28/2014  . Type II or unspecified type diabetes mellitus without mention of complication, uncontrolled 07/08/2013  . Hypokalemia 11/02/2012  . Essential hypertension, benign 11/02/2012  . Diabetes mellitus with neuropathy ([place]) 10/29/2012  . Hyperlipidemia 10/29/2012  . Spondylolisthesis of lumbar region 03/19/2011  . Lumbar radicular pain 03/19/2011    Scot Jun, PTA 07/14/2017, 12:34 PM  [place], [place], [address] Phone: [phone]   Fax:  [phone]  Name: [name] MRN: [id number] Date of Birth: 1941/03/04

## 2017-07-17 ENCOUNTER — Ambulatory Visit: Payer: Medicare Other | Admitting: Physical Therapy

## 2017-07-17 ENCOUNTER — Encounter: Payer: Self-pay | Admitting: Physical Therapy

## 2017-07-17 DIAGNOSIS — M545 Low back pain, unspecified: Secondary | ICD-10-CM

## 2017-07-17 DIAGNOSIS — M6281 Muscle weakness (generalized): Secondary | ICD-10-CM

## 2017-07-17 DIAGNOSIS — R262 Difficulty in walking, not elsewhere classified: Secondary | ICD-10-CM | POA: Diagnosis not present

## 2017-07-17 NOTE — Therapy (Signed)
[facility], [place], [address] Phone: [phone]   Fax:  [phone]  Physical Therapy Treatment  Patient Details  Name: [name] MRN: [id number] Date of Birth: 01-Mar-1941 Referring Provider: Toula Moos   Encounter Date: 07/17/2017  PT End of Session - 07/17/17 1151    Visit Number  8    Date for PT Re-Evaluation  08/23/17    PT Start Time  1100    PT Stop Time  1200    PT Time Calculation (min)  60 min    Activity Tolerance  Patient tolerated treatment well    Behavior During Therapy  [facility] for tasks assessed/performed       Past Medical History:  Diagnosis Date  . Acute blood loss anemia   . Acute encephalopathy 07/15/2016  . AKI (acute kidney injury) ([place])   . Anemia    has sickle cell trait  . Anxiety   . Arthritis   . Asthma    has used inhaler in past for asthmatic bronchitis, last time- early 2012  . Bilateral primary osteoarthritis of knee 03/26/2016  . Complication of anesthesia    wakes up shaking  . Diabetes mellitus   . Diabetes mellitus with neuropathy ([place]) 10/29/2012  . Diverticulitis   . Dyslipidemia   . Encephalopathy 06/2016   due to medications after surgery  . Fibromyalgia   . GERD (gastroesophageal reflux disease)    occas. use of  Prilosec  . Heart murmur    sees Dr. Montez Morita, last seen- early 2012  . Herniated nucleus pulposus, L2-3 06/25/2016  . History of back surgery   . Hypertension    02/2010- stress test /w PCP  . Hypothyroidism   . Loose bowel movements 12/2016  . Lumbar radiculopathy 06/27/2016  . Lumbar stenosis with neurogenic claudication 03/17/2017  . Memory disorder 02/16/2016  . Myoclonic jerking   . Neuromuscular disorder (HCC)    lumbar radiculopathy, lumbago  . Nocturnal leg cramps 09/27/2014  . Osteoporosis 03/10/2014  . Pneumonia   . Sickle cell trait ([place])   . Sleep apnea    borderline sleep apnea, states she no longer uses,  early 2012- stopped using   . Spinal stenosis of lumbar region with radiculopathy 02/28/2014  . Spondylolisthesis of lumbar region 03/19/2011  . Type II or unspecified type diabetes mellitus without mention of complication, uncontrolled 07/08/2013    Past Surgical History:  Procedure Laterality Date  . ABDOMINAL HYSTERECTOMY    . adb.cyst     ovarian cyst  . BACK SURGERY     2012, 2015 (3 total)  . BACK SURGERY  2018   01/2017 and 03/17/2017  . COLONOSCOPY    . EYE SURGERY     macular degeneration treatment - injections  . OVARIAN CYST SURGERY    . POSTERIOR LUMBAR FUSION 4 LEVEL N/A 03/17/2017   Procedure: Decompression of Lumbar One-Two with Thoracic Ten to Lumbar Two Fusion;  Surgeon: Kristeen Miss, MD;  Location: [facility];  Service: Neurosurgery;  Laterality: N/A;  Decompression of L1-2 with T10 to L2 Fusion    There were no vitals filed for this visit.  Subjective Assessment - 07/17/17 1102    Subjective  pt ab in clinic without AD. The Gatorade zero has helped    Currently in Pain?  Yes    Pain Score  6     Pain Location  Back    Pain Orientation  Right;Left  [facility] PT Assessment - 07/17/17 0001      AROM   Overall AROM Comments  lumbar ROM decrease 75% for all motions            No data recorded       [facility] Adult PT Treatment/Exercise - 07/17/17 0001      Lumbar Exercises: Aerobic   Recumbent Bike  L0 x6 min      Lumbar Exercises: Machines for Strengthening   Other Lumbar Machine Exercise  15lb rows and lats 2x10       Lumbar Exercises: Seated   Long Arc Quad on Chair  Both;10 reps;2 sets;Weights    LAQ on Chair Weights (lbs)  3    Other Seated Lumbar Exercises  HS curls green tband 2x10      Lumbar Exercises: Supine   Ab Set  3 seconds;5 reps;1 second    Bridge  2 seconds;10 reps;Compliant    Straight Leg Raise  10 reps;2 seconds    Other Supine Lumbar Exercises  hooklying marchex x10      Moist Heat Therapy   Number Minutes Moist Heat   15 Minutes    Moist Heat Location  Lumbar Spine      Electrical Stimulation   Electrical Stimulation Location  Low back and hip areas    Electrical Stimulation Action  IFC    Electrical Stimulation Parameters  sitting    Electrical Stimulation Goals  Pain               PT Short Term Goals - 06/23/17 1600      PT SHORT TERM GOAL #1   Title  independent with initial HEP    Time  2    Period  Weeks    Status  New        PT Long Term Goals - 07/03/17 1120      PT LONG TERM GOAL #1   Title  decrease pain 50%    Status  On-going      PT LONG TERM GOAL #2   Title  get up from sitting without pain and without hands    Status  On-going      PT LONG TERM GOAL #3   Title  increase lumbar ROM 25%    Baseline  same as eval -very guarded    Status  On-going      PT LONG TERM GOAL #4   Title  walk 1000 feet    Status  On-going            Plan - 07/17/17 1152    Clinical Impression Statement  Pt amb in clinic without AD. She reports that she is always in pain, but she wanted to get off the cane. Pt has good carryover with machine level exercises. She does reports that's leg curl and ext really mad her sore also has some knee pain from it. Pt with little hip elevation with hip bridges.    Rehab Potential  Good    PT Frequency  2x / week    PT Duration  8 weeks    PT Treatment/Interventions  ADLs/Self Care Home Management;Cryotherapy;Electrical Stimulation;Moist Heat;Neuromuscular re-education;Therapeutic exercise;Therapeutic activities;Functional mobility training;Stair training;Patient/family education;Manual techniques    PT Next Visit Plan  work on getting patient moving       Patient will benefit from skilled therapeutic intervention in order to improve the following deficits and impairments:  Abnormal gait, Decreased range of motion, Difficulty walking, Decreased endurance, Increased muscle spasms,  Decreased activity tolerance, Pain, Impaired flexibility,  Improper body mechanics, Postural dysfunction, Decreased strength, Decreased mobility  Visit Diagnosis: Muscle weakness (generalized)  Difficulty in walking, not elsewhere classified  Acute bilateral low back pain without sciatica     Problem List Patient Active Problem List   Diagnosis Date Noted  . Acute blood loss as cause of postoperative anemia 03/30/2017  . Diabetic polyneuropathy associated with secondary diabetes mellitus ([place]) 03/30/2017  . Asthma 03/30/2017  . Dyslipidemia 03/26/2017  . Lumbar stenosis with neurogenic claudication 03/17/2017  . Spinal stenosis of lumbar region 03/03/2017  . Myoclonic jerking   . Nausea   . Acute encephalopathy 07/15/2016  . Lumbar radiculopathy 06/27/2016  . Hypothyroidism 06/26/2016  . Labile blood glucose   . Surgery, elective   . Post-operative pain   . Sickle cell trait ([place])   . Acute blood loss anemia   . History of back surgery   . AKI (acute kidney injury) ([place])   . Herniated nucleus pulposus, L2-3 06/25/2016  . Bilateral primary osteoarthritis of knee 03/26/2016  . Osteoarthritis, hand 03/26/2016  . Other insomnia 03/26/2016  . Memory disorder 02/16/2016  . Fibromyalgia 09/27/2014  . Nocturnal leg cramps 09/27/2014  . Body mass index (BMI) of 30.0-30.9 in adult 08/04/2014  . Neuropathy 03/10/2014  . Allergic rhinitis 03/10/2014  . Osteoporosis 03/10/2014  . Spinal stenosis of lumbar region with radiculopathy 02/28/2014  . Type II or unspecified type diabetes mellitus without mention of complication, uncontrolled 07/08/2013  . Hypokalemia 11/02/2012  . Essential hypertension, benign 11/02/2012  . Diabetes mellitus with neuropathy ([place]) 10/29/2012  . Hyperlipidemia 10/29/2012  . Spondylolisthesis of lumbar region 03/19/2011  . Lumbar radicular pain 03/19/2011    Scot Jun 07/17/2017, 11:54 AM  [place], [place],  [address] Phone: [phone]   Fax:  [phone]  Name: [name] MRN: [id number] Date of Birth: 04-02-1941

## 2017-07-18 DIAGNOSIS — M85851 Other specified disorders of bone density and structure, right thigh: Secondary | ICD-10-CM | POA: Diagnosis not present

## 2017-07-21 ENCOUNTER — Other Ambulatory Visit: Payer: Self-pay | Admitting: Endocrinology

## 2017-07-21 NOTE — Telephone Encounter (Signed)
May refill 

## 2017-07-21 NOTE — Telephone Encounter (Signed)
Same as before, 24 units once a day

## 2017-07-21 NOTE — Telephone Encounter (Signed)
Please clarify dosage for this medication

## 2017-07-21 NOTE — Telephone Encounter (Signed)
I see that this is something that want the pt to take, but it was ordered by a historical provider, ok to refill? Please advise

## 2017-07-22 ENCOUNTER — Encounter: Payer: Self-pay | Admitting: Physical Therapy

## 2017-07-22 ENCOUNTER — Ambulatory Visit: Payer: Medicare Other | Attending: Rheumatology | Admitting: Physical Therapy

## 2017-07-22 DIAGNOSIS — M6281 Muscle weakness (generalized): Secondary | ICD-10-CM | POA: Diagnosis not present

## 2017-07-22 DIAGNOSIS — R262 Difficulty in walking, not elsewhere classified: Secondary | ICD-10-CM | POA: Insufficient documentation

## 2017-07-22 DIAGNOSIS — M545 Low back pain, unspecified: Secondary | ICD-10-CM

## 2017-07-22 NOTE — Therapy (Signed)
[place], [place], [address] Phone: [phone]   Fax:  [phone]  Physical Therapy Treatment  Patient Details  Name: [name] MRN: [id number] Date of Birth: [birth year] Referring Provider: Toula Moos   Encounter Date: 07/22/2017  PT End of Session - 07/22/17 1231    Visit Number  9    Date for PT Re-Evaluation  08/23/17    PT Start Time  1145    PT Stop Time  1244    PT Time Calculation (min)  59 min    Activity Tolerance  Patient tolerated treatment well    Behavior During Therapy  [facility] for tasks assessed/performed       Past Medical History:  Diagnosis Date  . Acute blood loss anemia   . Acute encephalopathy 07/15/2016  . AKI (acute kidney injury) ([place])   . Anemia    has sickle cell trait  . Anxiety   . Arthritis   . Asthma    has used inhaler in past for asthmatic bronchitis, last time- early 2012  . Bilateral primary osteoarthritis of knee 03/26/2016  . Complication of anesthesia    wakes up shaking  . Diabetes mellitus   . Diabetes mellitus with neuropathy ([place]) 10/29/2012  . Diverticulitis   . Dyslipidemia   . Encephalopathy 06/2016   due to medications after surgery  . Fibromyalgia   . GERD (gastroesophageal reflux disease)    occas. use of  Prilosec  . Heart murmur    sees Dr. Montez Morita, last seen- early 2012  . Herniated nucleus pulposus, L2-3 06/25/2016  . History of back surgery   . Hypertension    02/2010- stress test /w PCP  . Hypothyroidism   . Loose bowel movements 12/2016  . Lumbar radiculopathy 06/27/2016  . Lumbar stenosis with neurogenic claudication 03/17/2017  . Memory disorder 02/16/2016  . Myoclonic jerking   . Neuromuscular disorder (HCC)    lumbar radiculopathy, lumbago  . Nocturnal leg cramps 09/27/2014  . Osteoporosis 03/10/2014  . Pneumonia   . Sickle cell trait ([place])   . Sleep apnea    borderline sleep apnea, states she no longer uses,  early 2012- stopped using   . Spinal stenosis of lumbar region with radiculopathy 02/28/2014  . Spondylolisthesis of lumbar region 03/19/2011  . Type II or unspecified type diabetes mellitus without mention of complication, uncontrolled 07/08/2013    Past Surgical History:  Procedure Laterality Date  . ABDOMINAL HYSTERECTOMY    . adb.cyst     ovarian cyst  . BACK SURGERY     2012, 2015 (3 total)  . BACK SURGERY  2018   01/2017 and 03/17/2017  . COLONOSCOPY    . EYE SURGERY     macular degeneration treatment - injections  . OVARIAN CYST SURGERY    . POSTERIOR LUMBAR FUSION 4 LEVEL N/A 03/17/2017   Procedure: Decompression of Lumbar One-Two with Thoracic Ten to Lumbar Two Fusion;  Surgeon: Kristeen Miss, MD;  Location: [facility];  Service: Neurosurgery;  Laterality: N/A;  Decompression of L1-2 with T10 to L2 Fusion    There were no vitals filed for this visit.  Subjective Assessment - 07/22/17 1153    Subjective  "other than the cramps and the pine in my back, you got me doing all these exercises"    Currently in Pain?  Yes    Pain Score  5     Pain Location  -- legs and  back         [facility] PT Assessment - 07/22/17 0001      AROM   Overall AROM Comments  Lumbar ROM WFL for flexion, decreased 50% for rest of motions                   [facility] Adult PT Treatment/Exercise - 07/22/17 0001      Ambulation/Gait   Gait Comments  one flight of stairs one rail R alternating pattern      Lumbar Exercises: Aerobic   Nustep  L4  51min      Lumbar Exercises: Machines for Strengthening   Cybex Knee Flexion  20lb 2x10    Leg Press  20lb 2x10    Other Lumbar Machine Exercise  15lb rows and lats 2x10       Moist Heat Therapy   Number Minutes Moist Heat  15 Minutes    Moist Heat Location  Lumbar Spine      Electrical Stimulation   Electrical Stimulation Location  Low back and hip areas    Electrical Stimulation Action  IFC    Electrical Stimulation Parameters  sitting     Electrical Stimulation Goals  Pain               PT Short Term Goals - 06/23/17 1600      PT SHORT TERM GOAL #1   Title  independent with initial HEP    Time  2    Period  Weeks    Status  New        PT Long Term Goals - 07/22/17 1231      PT LONG TERM GOAL #3   Title  increase lumbar ROM 25%    Status  Achieved            Plan - 07/22/17 1232    Clinical Impression Statement  Pt reports osme improvement overall, she was able to go to church this weekend. Progressed to stair negotiation without issues, she did have some R knee pain. Reports that she still has some LE cramps at night. She did have some HS cramping in LLR after seated leg curls.    Rehab Potential  Good    PT Frequency  2x / week    PT Treatment/Interventions  ADLs/Self Care Home Management;Cryotherapy;Electrical Stimulation;Moist Heat;Neuromuscular re-education;Therapeutic exercise;Therapeutic activities;Functional mobility training;Stair training;Patient/family education;Manual techniques    PT Next Visit Plan  work on getting patient moving       Patient will benefit from skilled therapeutic intervention in order to improve the following deficits and impairments:  Abnormal gait, Decreased range of motion, Difficulty walking, Decreased endurance, Increased muscle spasms, Decreased activity tolerance, Pain, Impaired flexibility, Improper body mechanics, Postural dysfunction, Decreased strength, Decreased mobility  Visit Diagnosis: Muscle weakness (generalized)  Difficulty in walking, not elsewhere classified  Acute bilateral low back pain without sciatica     Problem List Patient Active Problem List   Diagnosis Date Noted  . Acute blood loss as cause of postoperative anemia 03/30/2017  . Diabetic polyneuropathy associated with secondary diabetes mellitus ([place]) 03/30/2017  . Asthma 03/30/2017  . Dyslipidemia 03/26/2017  . Lumbar stenosis with neurogenic claudication 03/17/2017  . Spinal  stenosis of lumbar region 03/03/2017  . Myoclonic jerking   . Nausea   . Acute encephalopathy 07/15/2016  . Lumbar radiculopathy 06/27/2016  . Hypothyroidism 06/26/2016  . Labile blood glucose   . Surgery, elective   . Post-operative pain   . Sickle cell trait (  [place])   . Acute blood loss anemia   . History of back surgery   . AKI (acute kidney injury) ([place])   . Herniated nucleus pulposus, L2-3 06/25/2016  . Bilateral primary osteoarthritis of knee 03/26/2016  . Osteoarthritis, hand 03/26/2016  . Other insomnia 03/26/2016  . Memory disorder 02/16/2016  . Fibromyalgia 09/27/2014  . Nocturnal leg cramps 09/27/2014  . Body mass index (BMI) of 30.0-30.9 in adult 08/04/2014  . Neuropathy 03/10/2014  . Allergic rhinitis 03/10/2014  . Osteoporosis 03/10/2014  . Spinal stenosis of lumbar region with radiculopathy 02/28/2014  . Type II or unspecified type diabetes mellitus without mention of complication, uncontrolled 07/08/2013  . Hypokalemia 11/02/2012  . Essential hypertension, benign 11/02/2012  . Diabetes mellitus with neuropathy ([place]) 10/29/2012  . Hyperlipidemia 10/29/2012  . Spondylolisthesis of lumbar region 03/19/2011  . Lumbar radicular pain 03/19/2011    Scot Jun, PTA 07/22/2017, 12:34 PM  [place], [place], [address] Phone: [phone]   Fax:  [phone]  Name: NANEA JARED MRN: [id number] Date of Birth: 1940-07-20

## 2017-07-24 ENCOUNTER — Ambulatory Visit: Payer: Medicare Other | Admitting: Physical Therapy

## 2017-07-24 ENCOUNTER — Encounter: Payer: Self-pay | Admitting: Physical Therapy

## 2017-07-24 DIAGNOSIS — M545 Low back pain, unspecified: Secondary | ICD-10-CM

## 2017-07-24 DIAGNOSIS — R262 Difficulty in walking, not elsewhere classified: Secondary | ICD-10-CM

## 2017-07-24 DIAGNOSIS — M6281 Muscle weakness (generalized): Secondary | ICD-10-CM

## 2017-07-24 NOTE — Therapy (Signed)
[place], [place], [address] Phone: [phone]   Fax:  [phone]  Physical Therapy Treatment  Patient Details  Name: [name] MRN: [id number] Date of Birth: [birth year] Referring Provider: Toula Moos   Encounter Date: 07/24/2017  PT End of Session - 07/24/17 1223    Visit Number  10    Date for PT Re-Evaluation  08/23/17    PT Start Time  6761    PT Stop Time  1239    PT Time Calculation (min)  54 min    Activity Tolerance  Patient tolerated treatment well    Behavior During Therapy  [facility] for tasks assessed/performed       Past Medical History:  Diagnosis Date  . Acute blood loss anemia   . Acute encephalopathy 07/15/2016  . AKI (acute kidney injury) ([place])   . Anemia    has sickle cell trait  . Anxiety   . Arthritis   . Asthma    has used inhaler in past for asthmatic bronchitis, last time- early 2012  . Bilateral primary osteoarthritis of knee 03/26/2016  . Complication of anesthesia    wakes up shaking  . Diabetes mellitus   . Diabetes mellitus with neuropathy ([place]) 10/29/2012  . Diverticulitis   . Dyslipidemia   . Encephalopathy 06/2016   due to medications after surgery  . Fibromyalgia   . GERD (gastroesophageal reflux disease)    occas. use of  Prilosec  . Heart murmur    sees Dr. Montez Morita, last seen- early 2012  . Herniated nucleus pulposus, L2-3 06/25/2016  . History of back surgery   . Hypertension    02/2010- stress test /w PCP  . Hypothyroidism   . Loose bowel movements 12/2016  . Lumbar radiculopathy 06/27/2016  . Lumbar stenosis with neurogenic claudication 03/17/2017  . Memory disorder 02/16/2016  . Myoclonic jerking   . Neuromuscular disorder (HCC)    lumbar radiculopathy, lumbago  . Nocturnal leg cramps 09/27/2014  . Osteoporosis 03/10/2014  . Pneumonia   . Sickle cell trait ([place])   . Sleep apnea    borderline sleep apnea, states she no longer uses,  early 2012- stopped using   . Spinal stenosis of lumbar region with radiculopathy 02/28/2014  . Spondylolisthesis of lumbar region 03/19/2011  . Type II or unspecified type diabetes mellitus without mention of complication, uncontrolled 07/08/2013    Past Surgical History:  Procedure Laterality Date  . ABDOMINAL HYSTERECTOMY    . adb.cyst     ovarian cyst  . BACK SURGERY     2012, 2015 (3 total)  . BACK SURGERY  2018   01/2017 and 03/17/2017  . COLONOSCOPY    . EYE SURGERY     macular degeneration treatment - injections  . OVARIAN CYST SURGERY    . POSTERIOR LUMBAR FUSION 4 LEVEL N/A 03/17/2017   Procedure: Decompression of Lumbar One-Two with Thoracic Ten to Lumbar Two Fusion;  Surgeon: Kristeen Miss, MD;  Location: [facility];  Service: Neurosurgery;  Laterality: N/A;  Decompression of L1-2 with T10 to L2 Fusion    There were no vitals filed for this visit.  Subjective Assessment - 07/24/17 1150    Subjective  "I feel ok, I mean I hear, but is part of the course for me"    Currently in Pain?  Yes    Pain Score  5     Pain Location  -- Legs  [facility] Adult PT Treatment/Exercise - 07/24/17 0001      Lumbar Exercises: Aerobic   Nustep  L4  32min      Lumbar Exercises: Machines for Strengthening   Other Lumbar Machine Exercise  20lb rows and lats 2x10       Lumbar Exercises: Standing   Shoulder Extension  10 reps;Theraband;Both    Theraband Level (Shoulder Extension)  Level 2 (Red)    Other Standing Lumbar Exercises  hip abd & ext x5 each, marching x10 each      Lumbar Exercises: Seated   Other Seated Lumbar Exercises  HS curls red x15, x10      Moist Heat Therapy   Number Minutes Moist Heat  15 Minutes    Moist Heat Location  Lumbar Spine      Electrical Stimulation   Electrical Stimulation Location  Low back and hip areas    Electrical Stimulation Action  IFC    Electrical Stimulation Parameters  sitting    Electrical Stimulation Goals   Pain               PT Short Term Goals - 06/23/17 1600      PT SHORT TERM GOAL #1   Title  independent with initial HEP    Time  2    Period  Weeks    Status  New        PT Long Term Goals - 07/22/17 1231      PT LONG TERM GOAL #3   Title  increase lumbar ROM 25%    Status  Achieved            Plan - 07/24/17 1224    Clinical Impression Statement  Pt reports improved mobility overall, but has pain in LE's and R hip. She states this pain have been going for years. Went back to [employer] LE exercises due to pt reports of cramping. Does today's exercises well, even having some postural awareness with standing shoulder extensions.     Rehab Potential  Good    PT Frequency  2x / week    PT Duration  8 weeks    PT Treatment/Interventions  ADLs/Self Care Home Management;Cryotherapy;Electrical Stimulation;Moist Heat;Neuromuscular re-education;Therapeutic exercise;Therapeutic activities;Functional mobility training;Stair training;Patient/family education;Manual techniques    PT Next Visit Plan  work on getting patient moving       Patient will benefit from skilled therapeutic intervention in order to improve the following deficits and impairments:  Abnormal gait, Decreased range of motion, Difficulty walking, Decreased endurance, Increased muscle spasms, Decreased activity tolerance, Pain, Impaired flexibility, Improper body mechanics, Postural dysfunction, Decreased strength, Decreased mobility  Visit Diagnosis: Acute bilateral low back pain without sciatica  Difficulty in walking, not elsewhere classified  Muscle weakness (generalized)     Problem List Patient Active Problem List   Diagnosis Date Noted  . Acute blood loss as cause of postoperative anemia 03/30/2017  . Diabetic polyneuropathy associated with secondary diabetes mellitus ([place]) 03/30/2017  . Asthma 03/30/2017  . Dyslipidemia 03/26/2017  . Lumbar stenosis with neurogenic claudication 03/17/2017  .  Spinal stenosis of lumbar region 03/03/2017  . Myoclonic jerking   . Nausea   . Acute encephalopathy 07/15/2016  . Lumbar radiculopathy 06/27/2016  . Hypothyroidism 06/26/2016  . Labile blood glucose   . Surgery, elective   . Post-operative pain   . Sickle cell trait ([place])   . Acute blood loss anemia   . History of back surgery   . AKI (acute kidney injury) ([place])   .  Herniated nucleus pulposus, L2-3 06/25/2016  . Bilateral primary osteoarthritis of knee 03/26/2016  . Osteoarthritis, hand 03/26/2016  . Other insomnia 03/26/2016  . Memory disorder 02/16/2016  . Fibromyalgia 09/27/2014  . Nocturnal leg cramps 09/27/2014  . Body mass index (BMI) of 30.0-30.9 in adult 08/04/2014  . Neuropathy 03/10/2014  . Allergic rhinitis 03/10/2014  . Osteoporosis 03/10/2014  . Spinal stenosis of lumbar region with radiculopathy 02/28/2014  . Type II or unspecified type diabetes mellitus without mention of complication, uncontrolled 07/08/2013  . Hypokalemia 11/02/2012  . Essential hypertension, benign 11/02/2012  . Diabetes mellitus with neuropathy ([place]) 10/29/2012  . Hyperlipidemia 10/29/2012  . Spondylolisthesis of lumbar region 03/19/2011  . Lumbar radicular pain 03/19/2011    Scot Jun, PTA 07/24/2017, 12:29 PM  [place], [place], [address] Phone: [phone]   Fax:  [phone]  Name: JERALYN NOLDEN MRN: [id number] Date of Birth: 11/24/1940

## 2017-07-28 ENCOUNTER — Encounter: Payer: Self-pay | Admitting: Allergy and Immunology

## 2017-07-30 ENCOUNTER — Ambulatory Visit: Payer: Medicare Other | Admitting: Physical Therapy

## 2017-07-30 ENCOUNTER — Encounter: Payer: Self-pay | Admitting: Physical Therapy

## 2017-07-30 DIAGNOSIS — R262 Difficulty in walking, not elsewhere classified: Secondary | ICD-10-CM | POA: Diagnosis not present

## 2017-07-30 DIAGNOSIS — M545 Low back pain, unspecified: Secondary | ICD-10-CM

## 2017-07-30 DIAGNOSIS — M6281 Muscle weakness (generalized): Secondary | ICD-10-CM

## 2017-07-30 NOTE — Therapy (Signed)
[place], [place], [address] Phone: [phone]   Fax:  [phone]  Physical Therapy Treatment  Patient Details  Name: [name] MRN: [id number] Date of Birth: Mar 25, 1941 Referring Provider: Toula Moos   Encounter Date: 07/30/2017  PT End of Session - 07/30/17 1146    Visit Number  11    Date for PT Re-Evaluation  08/23/17    PT Start Time  1058    PT Stop Time  1200    PT Time Calculation (min)  62 min    Activity Tolerance  Patient tolerated treatment well    Behavior During Therapy  [facility] for tasks assessed/performed       Past Medical History:  Diagnosis Date  . Acute blood loss anemia   . Acute encephalopathy 07/15/2016  . AKI (acute kidney injury) ([place])   . Anemia    has sickle cell trait  . Anxiety   . Arthritis   . Asthma    has used inhaler in past for asthmatic bronchitis, last time- early 2012  . Bilateral primary osteoarthritis of knee 03/26/2016  . Complication of anesthesia    wakes up shaking  . Diabetes mellitus   . Diabetes mellitus with neuropathy ([place]) 10/29/2012  . Diverticulitis   . Dyslipidemia   . Encephalopathy 06/2016   due to medications after surgery  . Fibromyalgia   . GERD (gastroesophageal reflux disease)    occas. use of  Prilosec  . Heart murmur    sees Dr. Montez Morita, last seen- early 2012  . Herniated nucleus pulposus, L2-3 06/25/2016  . History of back surgery   . Hypertension    02/2010- stress test /w PCP  . Hypothyroidism   . Loose bowel movements 12/2016  . Lumbar radiculopathy 06/27/2016  . Lumbar stenosis with neurogenic claudication 03/17/2017  . Memory disorder 02/16/2016  . Myoclonic jerking   . Neuromuscular disorder (HCC)    lumbar radiculopathy, lumbago  . Nocturnal leg cramps 09/27/2014  . Osteoporosis 03/10/2014  . Pneumonia   . Sickle cell trait ([place])   . Sleep apnea    borderline sleep apnea, states she no longer  uses, early 2012- stopped using   . Spinal stenosis of lumbar region with radiculopathy 02/28/2014  . Spondylolisthesis of lumbar region 03/19/2011  . Type II or unspecified type diabetes mellitus without mention of complication, uncontrolled 07/08/2013    Past Surgical History:  Procedure Laterality Date  . ABDOMINAL HYSTERECTOMY    . adb.cyst     ovarian cyst  . BACK SURGERY     2012, 2015 (3 total)  . BACK SURGERY  2018   01/2017 and 03/17/2017  . COLONOSCOPY    . EYE SURGERY     macular degeneration treatment - injections  . OVARIAN CYST SURGERY    . POSTERIOR LUMBAR FUSION 4 LEVEL N/A 03/17/2017   Procedure: Decompression of Lumbar One-Two with Thoracic Ten to Lumbar Two Fusion;  Surgeon: Kristeen Miss, MD;  Location: [facility];  Service: Neurosurgery;  Laterality: N/A;  Decompression of L1-2 with T10 to L2 Fusion    There were no vitals filed for this visit.  Subjective Assessment - 07/30/17 1057    Subjective  "pretty good, just hurts when I walk." and she reports that she exercised a little.    Pain Score  5     Pain Location  Back  [facility] Adult PT Treatment/Exercise - 07/30/17 0001      Ambulation/Gait   Gait Comments  walked one loop in front of the builidng patient required several rest breaks, vc to maintain posture      Lumbar Exercises: Aerobic   Nustep  L4 6 mins      Lumbar Exercises: Machines for Strengthening   Leg Press  30lbs 2x10    Other Lumbar Machine Exercise  20lb rows and lats 2x10       Lumbar Exercises: Standing   Theraband Level (Row)  Level 2 (Red) 2x10    Shoulder Extension  10 reps;Theraband;Both    Theraband Level (Shoulder Extension)  Level 2 (Red)      Moist Heat Therapy   Number Minutes Moist Heat  15 Minutes    Moist Heat Location  Lumbar Spine      Electrical Stimulation   Electrical Stimulation Location  Low back     Electrical Stimulation Action  IFC    Electrical Stimulation Parameters   Sitting    Electrical Stimulation Goals  Pain               PT Short Term Goals - 07/30/17 1104      PT SHORT TERM GOAL #4   Title  The patient will report neck pain < or equal to 3/10 (from baseline of 5/10).        PT Long Term Goals - 07/30/17 1105      PT LONG TERM GOAL #2   Title  get up from sitting without pain and without hands    Status  Achieved            Plan - 07/30/17 1148    Clinical Impression Statement  Pt needed cuing to maintain upright posture during ambulation and therapeutic exercise. She had a flexed posture and had to stop and rest several times during ambulating due to fatigue. she reports that she is doing more exercise at home.    Rehab Potential  Good    PT Frequency  2x / week    PT Duration  8 weeks    PT Treatment/Interventions  ADLs/Self Care Home Management;Cryotherapy;Electrical Stimulation;Moist Heat;Neuromuscular re-education;Therapeutic exercise;Therapeutic activities;Functional mobility training;Stair training;Patient/family education;Manual techniques    PT Next Visit Plan  Continue with strengthening and functional activity with a focus on posture awareness       Patient will benefit from skilled therapeutic intervention in order to improve the following deficits and impairments:  Abnormal gait, Decreased range of motion, Difficulty walking, Decreased endurance, Increased muscle spasms, Decreased activity tolerance, Pain, Impaired flexibility, Improper body mechanics, Postural dysfunction, Decreased strength, Decreased mobility  Visit Diagnosis: Acute bilateral low back pain without sciatica  Difficulty in walking, not elsewhere classified  Muscle weakness (generalized)     Problem List Patient Active Problem List   Diagnosis Date Noted  . Acute blood loss as cause of postoperative anemia 03/30/2017  . Diabetic polyneuropathy associated with secondary diabetes mellitus ([place]) 03/30/2017  . Asthma 03/30/2017  .  Dyslipidemia 03/26/2017  . Lumbar stenosis with neurogenic claudication 03/17/2017  . Spinal stenosis of lumbar region 03/03/2017  . Myoclonic jerking   . Nausea   . Acute encephalopathy 07/15/2016  . Lumbar radiculopathy 06/27/2016  . Hypothyroidism 06/26/2016  . Labile blood glucose   . Surgery, elective   . Post-operative pain   . Sickle cell trait ([place])   . Acute blood loss anemia   . History of back surgery   .  AKI (acute kidney injury) ([place])   . Herniated nucleus pulposus, L2-3 06/25/2016  . Bilateral primary osteoarthritis of knee 03/26/2016  . Osteoarthritis, hand 03/26/2016  . Other insomnia 03/26/2016  . Memory disorder 02/16/2016  . Fibromyalgia 09/27/2014  . Nocturnal leg cramps 09/27/2014  . Body mass index (BMI) of 30.0-30.9 in adult 08/04/2014  . Neuropathy 03/10/2014  . Allergic rhinitis 03/10/2014  . Osteoporosis 03/10/2014  . Spinal stenosis of lumbar region with radiculopathy 02/28/2014  . Type II or unspecified type diabetes mellitus without mention of complication, uncontrolled 07/08/2013  . Hypokalemia 11/02/2012  . Essential hypertension, benign 11/02/2012  . Diabetes mellitus with neuropathy ([place]) 10/29/2012  . Hyperlipidemia 10/29/2012  . Spondylolisthesis of lumbar region 03/19/2011  . Lumbar radicular pain 03/19/2011    Scot Jun 07/30/2017, 12:12 PM  [place], [place], [address] Phone: [phone]   Fax:  [phone]  Name: [name] MRN: [id number] Date of Birth: August 09, 1940

## 2017-07-31 ENCOUNTER — Ambulatory Visit: Payer: Medicare Other | Admitting: Physical Therapy

## 2017-07-31 ENCOUNTER — Encounter: Payer: Self-pay | Admitting: Physical Therapy

## 2017-07-31 DIAGNOSIS — M545 Low back pain, unspecified: Secondary | ICD-10-CM

## 2017-07-31 DIAGNOSIS — M6281 Muscle weakness (generalized): Secondary | ICD-10-CM | POA: Diagnosis not present

## 2017-07-31 DIAGNOSIS — R262 Difficulty in walking, not elsewhere classified: Secondary | ICD-10-CM

## 2017-07-31 NOTE — Therapy (Signed)
[place], [place], [address] Phone: [phone]   Fax:  [phone]  Physical Therapy Treatment  Patient Details  Name: [name] MRN: [id number] Date of Birth: 1940/12/23 Referring Provider: Toula Moos   Encounter Date: 07/31/2017  PT End of Session - 07/31/17 1228    Visit Number  12    Date for PT Re-Evaluation  08/23/17    PT Start Time  1145    PT Stop Time  1240    PT Time Calculation (min)  55 min    Activity Tolerance  Patient tolerated treatment well    Behavior During Therapy  [facility] for tasks assessed/performed       Past Medical History:  Diagnosis Date  . Acute blood loss anemia   . Acute encephalopathy 07/15/2016  . AKI (acute kidney injury) ([place])   . Anemia    has sickle cell trait  . Anxiety   . Arthritis   . Asthma    has used inhaler in past for asthmatic bronchitis, last time- early 2012  . Bilateral primary osteoarthritis of knee 03/26/2016  . Complication of anesthesia    wakes up shaking  . Diabetes mellitus   . Diabetes mellitus with neuropathy ([place]) 10/29/2012  . Diverticulitis   . Dyslipidemia   . Encephalopathy 06/2016   due to medications after surgery  . Fibromyalgia   . GERD (gastroesophageal reflux disease)    occas. use of  Prilosec  . Heart murmur    sees Dr. Montez Morita, last seen- early 2012  . Herniated nucleus pulposus, L2-3 06/25/2016  . History of back surgery   . Hypertension    02/2010- stress test /w PCP  . Hypothyroidism   . Loose bowel movements 12/2016  . Lumbar radiculopathy 06/27/2016  . Lumbar stenosis with neurogenic claudication 03/17/2017  . Memory disorder 02/16/2016  . Myoclonic jerking   . Neuromuscular disorder (HCC)    lumbar radiculopathy, lumbago  . Nocturnal leg cramps 09/27/2014  . Osteoporosis 03/10/2014  . Pneumonia   . Sickle cell trait ([place])   . Sleep apnea    borderline sleep apnea, states she no longer  uses, early 2012- stopped using   . Spinal stenosis of lumbar region with radiculopathy 02/28/2014  . Spondylolisthesis of lumbar region 03/19/2011  . Type II or unspecified type diabetes mellitus without mention of complication, uncontrolled 07/08/2013    Past Surgical History:  Procedure Laterality Date  . ABDOMINAL HYSTERECTOMY    . adb.cyst     ovarian cyst  . BACK SURGERY     2012, 2015 (3 total)  . BACK SURGERY  2018   01/2017 and 03/17/2017  . COLONOSCOPY    . EYE SURGERY     macular degeneration treatment - injections  . OVARIAN CYST SURGERY    . POSTERIOR LUMBAR FUSION 4 LEVEL N/A 03/17/2017   Procedure: Decompression of Lumbar One-Two with Thoracic Ten to Lumbar Two Fusion;  Surgeon: Kristeen Miss, MD;  Location: [facility];  Service: Neurosurgery;  Laterality: N/A;  Decompression of L1-2 with T10 to L2 Fusion    There were no vitals filed for this visit.  Subjective Assessment - 07/31/17 1148    Subjective  "I hurt today" Pt stated increase low back pain     Pain Score  7     Pain Location  Back    Pain Orientation  Right;Left  [facility] Adult PT Treatment/Exercise - 07/31/17 0001      Lumbar Exercises: Aerobic   Recumbent Bike  L0 x6 min  (Pended)     Nustep  L4 6 mins  (Pended)       Lumbar Exercises: Seated   Long Arc Quad on Chair  Both;10 reps;2 sets;Weights  (Pended)     LAQ on Chair Weights (lbs)  3  (Pended)     Sit to Stand  10 reps  (Pended)     Other Seated Lumbar Exercises  ab sets with pball 2x10; tband rowa blue 2x15  (Pended)     Other Seated Lumbar Exercises  pelvic ROM on sit fit x10  (Pended)                PT Short Term Goals - 07/30/17 1104      PT SHORT TERM GOAL #4   Title  The patient will report neck pain < or equal to 3/10 (from baseline of 5/10).        PT Long Term Goals - 07/30/17 1105      PT LONG TERM GOAL #2   Title  get up from sitting without pain and without hands    Status   Achieved            Plan - 07/31/17 1230    Clinical Impression Statement  Pt enters clinic reporting increase pain. She stated that she think it is due to having therapy yesterday opposed to having a day in between to rest. Some pain with seated anterior/poster pelvic ROM on sit fit. All exercises completed seated on sit fit for additional core stabilization.    Rehab Potential  Good    PT Frequency  2x / week    PT Duration  8 weeks    PT Treatment/Interventions  ADLs/Self Care Home Management;Cryotherapy;Electrical Stimulation;Moist Heat;Neuromuscular re-education;Therapeutic exercise;Therapeutic activities;Functional mobility training;Stair training;Patient/family education;Manual techniques    PT Next Visit Plan  Continue with strengthening and functional activity with a focus on posture awareness       Patient will benefit from skilled therapeutic intervention in order to improve the following deficits and impairments:  Abnormal gait, Decreased range of motion, Difficulty walking, Decreased endurance, Increased muscle spasms, Decreased activity tolerance, Pain, Impaired flexibility, Improper body mechanics, Postural dysfunction, Decreased strength, Decreased mobility  Visit Diagnosis: Difficulty in walking, not elsewhere classified  Acute bilateral low back pain without sciatica  Muscle weakness (generalized)     Problem List Patient Active Problem List   Diagnosis Date Noted  . Acute blood loss as cause of postoperative anemia 03/30/2017  . Diabetic polyneuropathy associated with secondary diabetes mellitus ([place]) 03/30/2017  . Asthma 03/30/2017  . Dyslipidemia 03/26/2017  . Lumbar stenosis with neurogenic claudication 03/17/2017  . Spinal stenosis of lumbar region 03/03/2017  . Myoclonic jerking   . Nausea   . Acute encephalopathy 07/15/2016  . Lumbar radiculopathy 06/27/2016  . Hypothyroidism 06/26/2016  . Labile blood glucose   . Surgery, elective   .  Post-operative pain   . Sickle cell trait ([place])   . Acute blood loss anemia   . History of back surgery   . AKI (acute kidney injury) ([place])   . Herniated nucleus pulposus, L2-3 06/25/2016  . Bilateral primary osteoarthritis of knee 03/26/2016  . Osteoarthritis, hand 03/26/2016  . Other insomnia 03/26/2016  . Memory disorder 02/16/2016  . Fibromyalgia 09/27/2014  . Nocturnal leg cramps 09/27/2014  . Body mass index (BMI) of 30.0-30.9 in  adult 08/04/2014  . Neuropathy 03/10/2014  . Allergic rhinitis 03/10/2014  . Osteoporosis 03/10/2014  . Spinal stenosis of lumbar region with radiculopathy 02/28/2014  . Type II or unspecified type diabetes mellitus without mention of complication, uncontrolled 07/08/2013  . Hypokalemia 11/02/2012  . Essential hypertension, benign 11/02/2012  . Diabetes mellitus with neuropathy ([place]) 10/29/2012  . Hyperlipidemia 10/29/2012  . Spondylolisthesis of lumbar region 03/19/2011  . Lumbar radicular pain 03/19/2011    Scot Jun, PTA 07/31/2017, 12:33 PM  [place], [place], [address] Phone: [phone]   Fax:  [phone]  Name: JAKEISHA STRICKER MRN: [id number] Date of Birth: 05/25/1940

## 2017-08-04 ENCOUNTER — Encounter: Payer: Self-pay | Admitting: Physical Therapy

## 2017-08-04 ENCOUNTER — Ambulatory Visit: Payer: Medicare Other | Admitting: Physical Therapy

## 2017-08-04 DIAGNOSIS — R262 Difficulty in walking, not elsewhere classified: Secondary | ICD-10-CM | POA: Diagnosis not present

## 2017-08-04 DIAGNOSIS — M6281 Muscle weakness (generalized): Secondary | ICD-10-CM | POA: Diagnosis not present

## 2017-08-04 DIAGNOSIS — M545 Low back pain, unspecified: Secondary | ICD-10-CM

## 2017-08-04 NOTE — Therapy (Signed)
[place], [place], [address] Phone: [phone]   Fax:  [phone]  Physical Therapy Treatment  Patient Details  Name: [name] MRN: [id number] Date of Birth: 06/20/1940 Referring Provider: Toula Moos   Encounter Date: 08/04/2017  PT End of Session - 08/04/17 1437    Visit Number  13    Date for PT Re-Evaluation  08/23/17    PT Start Time  1345    PT Stop Time  1452    PT Time Calculation (min)  67 min    Activity Tolerance  Patient tolerated treatment well    Behavior During Therapy  [facility] for tasks assessed/performed       Past Medical History:  Diagnosis Date  . Acute blood loss anemia   . Acute encephalopathy 07/15/2016  . AKI (acute kidney injury) ([place])   . Anemia    has sickle cell trait  . Anxiety   . Arthritis   . Asthma    has used inhaler in past for asthmatic bronchitis, last time- early 2012  . Bilateral primary osteoarthritis of knee 03/26/2016  . Complication of anesthesia    wakes up shaking  . Diabetes mellitus   . Diabetes mellitus with neuropathy ([place]) 10/29/2012  . Diverticulitis   . Dyslipidemia   . Encephalopathy 06/2016   due to medications after surgery  . Fibromyalgia   . GERD (gastroesophageal reflux disease)    occas. use of  Prilosec  . Heart murmur    sees Dr. Montez Morita, last seen- early 2012  . Herniated nucleus pulposus, L2-3 06/25/2016  . History of back surgery   . Hypertension    02/2010- stress test /w PCP  . Hypothyroidism   . Loose bowel movements 12/2016  . Lumbar radiculopathy 06/27/2016  . Lumbar stenosis with neurogenic claudication 03/17/2017  . Memory disorder 02/16/2016  . Myoclonic jerking   . Neuromuscular disorder (HCC)    lumbar radiculopathy, lumbago  . Nocturnal leg cramps 09/27/2014  . Osteoporosis 03/10/2014  . Pneumonia   . Sickle cell trait ([place])   . Sleep apnea    borderline sleep apnea, states she no longer  uses, early 2012- stopped using   . Spinal stenosis of lumbar region with radiculopathy 02/28/2014  . Spondylolisthesis of lumbar region 03/19/2011  . Type II or unspecified type diabetes mellitus without mention of complication, uncontrolled 07/08/2013    Past Surgical History:  Procedure Laterality Date  . ABDOMINAL HYSTERECTOMY    . adb.cyst     ovarian cyst  . BACK SURGERY     2012, 2015 (3 total)  . BACK SURGERY  2018   01/2017 and 03/17/2017  . COLONOSCOPY    . EYE SURGERY     macular degeneration treatment - injections  . OVARIAN CYST SURGERY    . POSTERIOR LUMBAR FUSION 4 LEVEL N/A 03/17/2017   Procedure: Decompression of Lumbar One-Two with Thoracic Ten to Lumbar Two Fusion;  Surgeon: Kristeen Miss, MD;  Location: [facility];  Service: Neurosurgery;  Laterality: N/A;  Decompression of L1-2 with T10 to L2 Fusion    There were no vitals filed for this visit.  Subjective Assessment - 08/04/17 1346    Subjective  "Im ok, but you know I still hurt, but I always hurt" "I have been hurting for the past six years."    Currently in Pain?  Yes    Pain Score  5     Pain Location  --  thighs                        [facility] Adult PT Treatment/Exercise - 08/04/17 0001      Lumbar Exercises: Aerobic   Tread Mill  53mph x63min     Recumbent Bike  L0 x4 min    Nustep  L4 6 mins      Lumbar Exercises: Machines for Strengthening   Cybex Lumbar Extension  black band 2x10     Other Lumbar Machine Exercise  20lb rows and lats 2x10     Other Lumbar Machine Exercise  lumbar flex blackband 2x10       Lumbar Exercises: Standing   Other Standing Lumbar Exercises  2lb stabding marches HHA x2      Lumbar Exercises: Seated   Sit to Stand  10 reps x2 hands on knees     Other Seated Lumbar Exercises  ab sets with pball 2x10; tband rowa blue 2x15      Moist Heat Therapy   Number Minutes Moist Heat  15 Minutes    Moist Heat Location  Lumbar Spine      Electrical Stimulation    Electrical Stimulation Location  Low back     Electrical Stimulation Action  IFC    Electrical Stimulation Parameters  sitting    Electrical Stimulation Goals  Pain               PT Short Term Goals - 07/30/17 1104      PT SHORT TERM GOAL #4   Title  The patient will report neck pain < or equal to 3/10 (from baseline of 5/10).        PT Long Term Goals - 07/30/17 1105      PT LONG TERM GOAL #2   Title  get up from sitting without pain and without hands    Status  Achieved            Plan - 08/04/17 1437    Clinical Impression Statement  Pt with a better tolerance of activity. Ambulates with a forward flex posture, can correct with cues but reports increase pain to stand up straight. Progressed to treadmill walking and more core stability interventions. Good strength with sit to stand, but uses some slight forward momentum to stand up .      Rehab Potential  Good    PT Frequency  2x / week    PT Duration  8 weeks    PT Treatment/Interventions  ADLs/Self Care Home Management;Cryotherapy;Electrical Stimulation;Moist Heat;Neuromuscular re-education;Therapeutic exercise;Therapeutic activities;Functional mobility training;Stair training;Patient/family education;Manual techniques    PT Next Visit Plan  Continue with strengthening and functional activity with a focus on posture awareness       Patient will benefit from skilled therapeutic intervention in order to improve the following deficits and impairments:  Abnormal gait, Decreased range of motion, Difficulty walking, Decreased endurance, Increased muscle spasms, Decreased activity tolerance, Pain, Impaired flexibility, Improper body mechanics, Postural dysfunction, Decreased strength, Decreased mobility  Visit Diagnosis: Difficulty in walking, not elsewhere classified  Acute bilateral low back pain without sciatica  Muscle weakness (generalized)     Problem List Patient Active Problem List   Diagnosis Date  Noted  . Acute blood loss as cause of postoperative anemia 03/30/2017  . Diabetic polyneuropathy associated with secondary diabetes mellitus ([place]) 03/30/2017  . Asthma 03/30/2017  . Dyslipidemia 03/26/2017  . Lumbar stenosis with neurogenic claudication 03/17/2017  . Spinal stenosis of lumbar region  03/03/2017  . Myoclonic jerking   . Nausea   . Acute encephalopathy 07/15/2016  . Lumbar radiculopathy 06/27/2016  . Hypothyroidism 06/26/2016  . Labile blood glucose   . Surgery, elective   . Post-operative pain   . Sickle cell trait ([place])   . Acute blood loss anemia   . History of back surgery   . AKI (acute kidney injury) ([place])   . Herniated nucleus pulposus, L2-3 06/25/2016  . Bilateral primary osteoarthritis of knee 03/26/2016  . Osteoarthritis, hand 03/26/2016  . Other insomnia 03/26/2016  . Memory disorder 02/16/2016  . Fibromyalgia 09/27/2014  . Nocturnal leg cramps 09/27/2014  . Body mass index (BMI) of 30.0-30.9 in adult 08/04/2014  . Neuropathy 03/10/2014  . Allergic rhinitis 03/10/2014  . Osteoporosis 03/10/2014  . Spinal stenosis of lumbar region with radiculopathy 02/28/2014  . Type II or unspecified type diabetes mellitus without mention of complication, uncontrolled 07/08/2013  . Hypokalemia 11/02/2012  . Essential hypertension, benign 11/02/2012  . Diabetes mellitus with neuropathy ([place]) 10/29/2012  . Hyperlipidemia 10/29/2012  . Spondylolisthesis of lumbar region 03/19/2011  . Lumbar radicular pain 03/19/2011    Scot Jun, PTA 08/04/2017, 2:40 PM  [place], [place], [address] Phone: [phone]   Fax:  [phone]  Name: [name] MRN: [id number] Date of Birth: 11-28-1940

## 2017-08-04 NOTE — Progress Notes (Addendum)
Office Visit Note  Patient: [name]             Date of Birth: May 31, 1940           MRN: [id number]             PCP: Wenda Low, MD Referring: Wenda Low, MD Visit Date: 08/11/2017 Occupation: '@GUAROCC' @    Subjective:  Right knee pain    History of Present Illness: [name] is a 77 y.o. female with history of fibromyalgia, osteoarthritis, and DDD.  Patient states that she has been having increased generalized pain due to her fibromyalgia.  She states that her pain is been worsening due to the frequent weather changes.  She states that her pain is most severe in her right knee.  She denies any joint swelling.  She states that she will occasionally wear a knee sleeve, which helps with stability and comfort.  She states that she will intermittently have a catching and locking sensation.  She states that her right knee will occasionally give out on her.  She has been walking with a cane.  She denies any pain in her left knee at this time.  She continues to have chronic pain in her lower back.  She has been going to physical therapy which has been helping her pain and flexibility.  She denies any hand pain or hand swelling at this time.  She reports that her left trochanteric bursa resolved after the cortisone injection.  She states that her right trochanteric bursa continues to be tender.  She states that she had a cortisone injection performed to her pain clinic in her right trochanteric bursa which actually made her pain worse.     Activities of Daily Living:  Patient reports morning stiffness for 2 hours.   Patient Reports nocturnal pain.  Difficulty dressing/grooming: Denies Difficulty climbing stairs: Reports Difficulty getting out of chair: Reports Difficulty using hands for taps, buttons, cutlery, and/or writing: Denies   Review of Systems  Constitutional: Positive for fatigue.  HENT: Positive for mouth dryness. Negative for mouth sores and nose dryness.     Eyes: Positive for dryness. Negative for pain and visual disturbance.  Respiratory: Negative for cough, hemoptysis, shortness of breath and difficulty breathing.   Cardiovascular: Negative for chest pain, palpitations, hypertension and swelling in legs/feet.  Gastrointestinal: Negative for blood in stool, constipation and diarrhea.  Endocrine: Negative for increased urination.  Genitourinary: Negative for painful urination.  Musculoskeletal: Positive for arthralgias, joint pain, morning stiffness and muscle tenderness. Negative for joint swelling, myalgias, muscle weakness and myalgias.  Skin: Negative for color change, pallor, rash, hair loss, nodules/bumps, skin tightness, ulcers and sensitivity to sunlight.  Allergic/Immunologic: Negative for susceptible to infections.  Neurological: Negative for dizziness, numbness, headaches and weakness.  Hematological: Negative for swollen glands.  Psychiatric/Behavioral: Positive for sleep disturbance. Negative for depressed mood. The patient is not nervous/anxious.     PMFS History:  Patient Active Problem List   Diagnosis Date Noted  . Acute blood loss as cause of postoperative anemia 03/30/2017  . Diabetic polyneuropathy associated with secondary diabetes mellitus ([place]) 03/30/2017  . Asthma 03/30/2017  . Dyslipidemia 03/26/2017  . Lumbar stenosis with neurogenic claudication 03/17/2017  . Spinal stenosis of lumbar region 03/03/2017  . Myoclonic jerking   . Nausea   . Acute encephalopathy 07/15/2016  . Lumbar radiculopathy 06/27/2016  . Hypothyroidism 06/26/2016  . Labile blood glucose   . Surgery, elective   . Post-operative pain   .  Sickle cell trait ([place])   . Acute blood loss anemia   . History of back surgery   . AKI (acute kidney injury) ([place])   . Herniated nucleus pulposus, L2-3 06/25/2016  . Bilateral primary osteoarthritis of knee 03/26/2016  . Osteoarthritis, hand 03/26/2016  . Other insomnia 03/26/2016  . Memory disorder  02/16/2016  . Fibromyalgia 09/27/2014  . Nocturnal leg cramps 09/27/2014  . Body mass index (BMI) of 30.0-30.9 in adult 08/04/2014  . Neuropathy 03/10/2014  . Allergic rhinitis 03/10/2014  . Osteoporosis 03/10/2014  . Spinal stenosis of lumbar region with radiculopathy 02/28/2014  . Type II or unspecified type diabetes mellitus without mention of complication, uncontrolled 07/08/2013  . Hypokalemia 11/02/2012  . Essential hypertension, benign 11/02/2012  . Diabetes mellitus with neuropathy ([place]) 10/29/2012  . Hyperlipidemia 10/29/2012  . Spondylolisthesis of lumbar region 03/19/2011  . Lumbar radicular pain 03/19/2011    Past Medical History:  Diagnosis Date  . Acute blood loss anemia   . Acute encephalopathy 07/15/2016  . AKI (acute kidney injury) ([place])   . Anemia    has sickle cell trait  . Anxiety   . Arthritis   . Asthma    has used inhaler in past for asthmatic bronchitis, last time- early 2012  . Bilateral primary osteoarthritis of knee 03/26/2016  . Complication of anesthesia    wakes up shaking  . Diabetes mellitus   . Diabetes mellitus with neuropathy ([place]) 10/29/2012  . Diverticulitis   . Dyslipidemia   . Encephalopathy 06/2016   due to medications after surgery  . Fibromyalgia   . GERD (gastroesophageal reflux disease)    occas. use of  Prilosec  . Heart murmur    sees Dr. Montez Morita, last seen- early 2012  . Herniated nucleus pulposus, L2-3 06/25/2016  . History of back surgery   . Hypertension    02/2010- stress test /w PCP  . Hypothyroidism   . Loose bowel movements 12/2016  . Lumbar radiculopathy 06/27/2016  . Lumbar stenosis with neurogenic claudication 03/17/2017  . Memory disorder 02/16/2016  . Myoclonic jerking   . Neuromuscular disorder (HCC)    lumbar radiculopathy, lumbago  . Nocturnal leg cramps 09/27/2014  . Osteoporosis 03/10/2014  . Pneumonia   . Sickle cell trait ([place])   . Sleep apnea    borderline sleep apnea, states she no longer uses, early  2012- stopped using   . Spinal stenosis of lumbar region with radiculopathy 02/28/2014  . Spondylolisthesis of lumbar region 03/19/2011  . Type II or unspecified type diabetes mellitus without mention of complication, uncontrolled 07/08/2013    Family History  Problem Relation Age of Onset  . Ovarian cancer Mother   . Cancer - Prostate Father   . Breast cancer Paternal Aunt   . Multiple myeloma Paternal Aunt   . Anesthesia problems Neg Hx   . Hypotension Neg Hx   . Malignant hyperthermia Neg Hx   . Pseudochol deficiency Neg Hx    Past Surgical History:  Procedure Laterality Date  . ABDOMINAL HYSTERECTOMY    . adb.cyst     ovarian cyst  . BACK SURGERY     2012, 2015 (3 total)  . BACK SURGERY  2018   01/2017 and 03/17/2017  . COLONOSCOPY    . EYE SURGERY     macular degeneration treatment - injections  . OVARIAN CYST SURGERY    . POSTERIOR LUMBAR FUSION 4 LEVEL N/A 03/17/2017   Procedure: Decompression of Lumbar One-Two with Thoracic Ten to  Lumbar Two Fusion;  Surgeon: Kristeen Miss, MD;  Location: [facility];  Service: Neurosurgery;  Laterality: N/A;  Decompression of L1-2 with T10 to L2 Fusion   Social History   Social History Narrative   Patient drinks caffeine occasionally.   Patient is right handed.   Admitted to [facility] 03/21/17   Divorced    Former smoker - stopped 2000    Alcohol - occasionally wine at dinner   Full code     Objective: Vital Signs: BP 121/76 (BP Location: Left Arm, Patient Position: Sitting, Cuff Size: Normal)   Pulse 73   Resp 15   Ht '5\' 5"'  (1.651 m)   Wt 180 lb (81.6 kg)   BMI 29.95 kg/m    Physical Exam  Constitutional: She is oriented to person, place, and time. She appears well-developed and well-nourished.  HENT:  Head: Normocephalic and atraumatic.  Eyes: Conjunctivae and EOM are normal.  Neck: Normal range of motion.  Cardiovascular: Normal rate, regular rhythm, normal heart sounds and intact distal pulses.    Pulmonary/Chest: Effort normal and breath sounds normal.  Abdominal: Soft. Bowel sounds are normal.  Lymphadenopathy:    She has no cervical adenopathy.  Neurological: She is alert and oriented to person, place, and time.  Skin: Skin is warm and dry. Capillary refill takes less than 2 seconds.  Psychiatric: She has a normal mood and affect. Her behavior is normal.  Nursing note and vitals reviewed.    Musculoskeletal Exam: C-spine, thoracic spine, lumbar spine good range of motion. She has midline spinal tenderness in the lumbar region.  No SI joint tenderness.  Shoulder joints, elbow joints, wrist joints, MCPs, PIPs, DIPs good range of motion with no synovitis.  Hip joints, knee joints, ankle joints, MTPs, PIPs, DIPs good range of motion with no synovitis.  No warmth or effusion of bilateral knees. She has bilateral knee crepitus.  She has tenderness of right trochanteric bursa.  CDAI Exam: No CDAI exam completed.    Investigation: No additional findings.   Imaging: Xr Knee 3 View Right  Result Date: 08/11/2017 Severe medial compartment narrowing with medial osteophytes and lateral osteophytes were noted.  No chondrocalcinosis was noted.  Severe patellofemoral narrowing was noted. Impression: severe osteoarthritis and severe chondromalacia patella   Speciality Comments: No specialty comments available.    Procedures:  Large Joint Inj: R knee on 08/11/2017 11:55 AM Indications: pain Details: 27 G 1.5 in needle, medial approach  Arthrogram: No  Medications: 1.5 mL lidocaine 1 %; 40 mg triamcinolone acetonide 40 MG/ML Aspirate: 0 mL Outcome: tolerated well, no immediate complications Procedure, treatment alternatives, risks and benefits explained, specific risks discussed. Consent was given by the patient. Immediately prior to procedure a time out was called to verify the correct patient, procedure, equipment, support staff and site/side marked as required. Patient was prepped  and draped in the usual sterile fashion.     Allergies: Cymbalta [duloxetine hcl]; Baclofen; Betadine [povidone iodine]; Adhesive [tape]; and Lipitor [atorvastatin]   Assessment / Plan:     Visit Diagnoses: Fibromyalgia: She has generalized hyperalgesia on exam.  She has been having increased muscle tenderness and muscle tension.  She has been attending physical therapy which she has been helping with her strengthening and flexibility.  She is encouraged to continue to stay active.  She continues to have chronic fatigue and insomnia.  She is going to start going to water aerobics on a regular basis.  Primary osteoarthritis of both hands:  She has osteoarthritic changes in bilateral hands.  No synovitis noted. She has no discomfort at this time.    DDD (degenerative disc disease), lumbar - s/p fusion x3: Chronic pain.  She has midline spinal tenderness on exam today.  Physical therapy has been helping with her flexibility and discomfort.  Trochanteric bursitis of right hip: She has tenderness of right trochanteric bursa.  She had a right trochanteric bursa cortisone injection performed by another provider, which increased her discomfort.   Age-related osteoporosis without current pathological fracture - Ttd with Fosamax in the past by her PCP.  The patient has been getting DEXA through her PCP.  Other insomnia: Chronic.  History of hypertension: She is advised to monitor her blood pressure closely following the cortisone injection today.  History of neuropathy  History of diabetes mellitus: She is advised to monitor blood glucose level closely following the cortisone injection today.  History of hypothyroidism  History of sleep apnea  Dyslipidemia  Chronic pain of right knee -she has no warmth or effusion of her right knee.  She has right knee crepitus.  She has been having increased pain in her right knee.  She reports occasional touching and locking sensation.  She has never had a  cortisone injection.  Indications and potential side effects were discussed.  Right knee cortisone injection was performed today in the office.  She tolerated the procedure well.  An x-ray of her right knee was obtained today which revealed severe osteoarthritis and chondromalacia patella.  She was given a handout on knee exercises that she can perform at home.  Plan: XR KNEE 3 VIEW RIGHT    Orders: Orders Placed This Encounter  Procedures  . Large Joint Inj  . XR KNEE 3 VIEW RIGHT   No orders of the defined types were placed in this encounter.   Face-to-face time spent with patient was 30 minutes. >50% of time was spent in counseling and coordination of care.  Follow-Up Instructions: Return in about 6 months (around 02/10/2018) for Fibromyalgia, Osteoarthritis.   Ofilia Neas, PA-C I examined and evaluated the patient with Hazel Sams PA.  She has severe osteoarthritis of her right knee joint.  Detailed counseling was provided.  At this point she is not ready for total knee replacement.  We will request right knee joint was injected with cortisone.  I have advised her to monitor blood pressure closely.  She may consider Visco supplement injections in the future.  The plan of care was discussed as noted above.  Bo Merino, MD  Note - This record has been created using Editor, commissioning.  Chart creation errors have been sought, but may not always  have been located. Such creation errors do not reflect on  the standard of medical care.

## 2017-08-06 ENCOUNTER — Encounter: Payer: Self-pay | Admitting: Physical Therapy

## 2017-08-06 ENCOUNTER — Ambulatory Visit: Payer: Medicare Other | Admitting: Physical Therapy

## 2017-08-06 DIAGNOSIS — R262 Difficulty in walking, not elsewhere classified: Secondary | ICD-10-CM | POA: Diagnosis not present

## 2017-08-06 DIAGNOSIS — M545 Low back pain, unspecified: Secondary | ICD-10-CM

## 2017-08-06 DIAGNOSIS — M6281 Muscle weakness (generalized): Secondary | ICD-10-CM

## 2017-08-06 NOTE — Therapy (Signed)
[place], [place], [address] Phone: [phone]   Fax:  [phone]  Physical Therapy Treatment  Patient Details  Name: [name] MRN: [id number] Date of Birth: 04-04-1941 Referring Provider: Toula Moos   Encounter Date: 08/06/2017  PT End of Session - 08/06/17 1224    Visit Number  14    Date for PT Re-Evaluation  08/23/17    PT Start Time  1145    PT Stop Time  1240    PT Time Calculation (min)  55 min    Activity Tolerance  Patient tolerated treatment well    Behavior During Therapy  [facility] for tasks assessed/performed       Past Medical History:  Diagnosis Date  . Acute blood loss anemia   . Acute encephalopathy 07/15/2016  . AKI (acute kidney injury) ([place])   . Anemia    has sickle cell trait  . Anxiety   . Arthritis   . Asthma    has used inhaler in past for asthmatic bronchitis, last time- early 2012  . Bilateral primary osteoarthritis of knee 03/26/2016  . Complication of anesthesia    wakes up shaking  . Diabetes mellitus   . Diabetes mellitus with neuropathy ([place]) 10/29/2012  . Diverticulitis   . Dyslipidemia   . Encephalopathy 06/2016   due to medications after surgery  . Fibromyalgia   . GERD (gastroesophageal reflux disease)    occas. use of  Prilosec  . Heart murmur    sees Dr. Montez Morita, last seen- early 2012  . Herniated nucleus pulposus, L2-3 06/25/2016  . History of back surgery   . Hypertension    02/2010- stress test /w PCP  . Hypothyroidism   . Loose bowel movements 12/2016  . Lumbar radiculopathy 06/27/2016  . Lumbar stenosis with neurogenic claudication 03/17/2017  . Memory disorder 02/16/2016  . Myoclonic jerking   . Neuromuscular disorder (HCC)    lumbar radiculopathy, lumbago  . Nocturnal leg cramps 09/27/2014  . Osteoporosis 03/10/2014  . Pneumonia   . Sickle cell trait ([place])   . Sleep apnea    borderline sleep apnea, states she no longer  uses, early 2012- stopped using   . Spinal stenosis of lumbar region with radiculopathy 02/28/2014  . Spondylolisthesis of lumbar region 03/19/2011  . Type II or unspecified type diabetes mellitus without mention of complication, uncontrolled 07/08/2013    Past Surgical History:  Procedure Laterality Date  . ABDOMINAL HYSTERECTOMY    . adb.cyst     ovarian cyst  . BACK SURGERY     2012, 2015 (3 total)  . BACK SURGERY  2018   01/2017 and 03/17/2017  . COLONOSCOPY    . EYE SURGERY     macular degeneration treatment - injections  . OVARIAN CYST SURGERY    . POSTERIOR LUMBAR FUSION 4 LEVEL N/A 03/17/2017   Procedure: Decompression of Lumbar One-Two with Thoracic Ten to Lumbar Two Fusion;  Surgeon: Kristeen Miss, MD;  Location: [place];  Service: Neurosurgery;  Laterality: N/A;  Decompression of L1-2 with T10 to L2 Fusion    There were no vitals filed for this visit.  Subjective Assessment - 08/06/17 1149    Subjective  "It is going ok" "I always have pain"    Currently in Pain?  Yes    Pain Score  6     Pain Location  -- legs and lower back  [facility] Adult PT Treatment/Exercise - 08/06/17 0001      Ambulation/Gait   Gait Comments  down steps alt pattern, arount abd back up hill. Multiple rest breaks needed due to LE and low back fatigue      Lumbar Exercises: Aerobic   Nustep  L4 6 mins      Lumbar Exercises: Standing   Row  15 reps;Theraband;Both    Theraband Level (Row)  Level 3 (Green)    Shoulder Extension  Theraband;Both;15 reps    Theraband Level (Shoulder Extension)  Level 3 (Green)      Lumbar Exercises: Seated   Sit to Stand  15 reps    Other Seated Lumbar Exercises  ab sets with pball 2x10; tband rowa blue 2x15    Other Seated Lumbar Exercises  pelvic ROM on sit fit x10      Moist Heat Therapy   Number Minutes Moist Heat  15 Minutes    Moist Heat Location  Lumbar Spine      Electrical Stimulation   Electrical Stimulation  Location  Low back     Electrical Stimulation Action  -- IFC    Electrical Stimulation Parameters  sititng    Electrical Stimulation Goals  Pain               PT Short Term Goals - 07/30/17 1104      PT SHORT TERM GOAL #4   Title  The patient will report neck pain < or equal to 3/10 (from baseline of 5/10).        PT Long Term Goals - 08/06/17 1226      PT LONG TERM GOAL #1   Title  decrease pain 50%    Status  On-going      PT LONG TERM GOAL #2   Title  get up from sitting without pain and without hands    Status  Achieved      PT LONG TERM GOAL #3   Title  increase lumbar ROM 25%    Status  Achieved      PT LONG TERM GOAL #4   Title  walk 1000 feet    Status  Partially Met            Plan - 08/06/17 1225    Clinical Impression Statement  Fatigues quick with functional activities. Multiple rest breaks needed with outdoor up hill ambulation. Added additional reps to sit to stand and standing postural interventions. no reports of increase pain, but pt climes to always be in pain.    Rehab Potential  Good    PT Frequency  2x / week    PT Treatment/Interventions  ADLs/Self Care Home Management;Cryotherapy;Electrical Stimulation;Moist Heat;Neuromuscular re-education;Therapeutic exercise;Therapeutic activities;Functional mobility training;Stair training;Patient/family education;Manual techniques    PT Next Visit Plan  Continue with strengthening and functional activity with a focus on posture awareness       Patient will benefit from skilled therapeutic intervention in order to improve the following deficits and impairments:  Abnormal gait, Decreased range of motion, Difficulty walking, Decreased endurance, Increased muscle spasms, Decreased activity tolerance, Pain, Impaired flexibility, Improper body mechanics, Postural dysfunction, Decreased strength, Decreased mobility  Visit Diagnosis: Difficulty in walking, not elsewhere classified  Muscle weakness  (generalized)  Acute bilateral low back pain without sciatica     Problem List Patient Active Problem List   Diagnosis Date Noted  . Acute blood loss as cause of postoperative anemia 03/30/2017  . Diabetic polyneuropathy associated with secondary diabetes mellitus ([place])  03/30/2017  . Asthma 03/30/2017  . Dyslipidemia 03/26/2017  . Lumbar stenosis with neurogenic claudication 03/17/2017  . Spinal stenosis of lumbar region 03/03/2017  . Myoclonic jerking   . Nausea   . Acute encephalopathy 07/15/2016  . Lumbar radiculopathy 06/27/2016  . Hypothyroidism 06/26/2016  . Labile blood glucose   . Surgery, elective   . Post-operative pain   . Sickle cell trait ([place])   . Acute blood loss anemia   . History of back surgery   . AKI (acute kidney injury) ([place])   . Herniated nucleus pulposus, L2-3 06/25/2016  . Bilateral primary osteoarthritis of knee 03/26/2016  . Osteoarthritis, hand 03/26/2016  . Other insomnia 03/26/2016  . Memory disorder 02/16/2016  . Fibromyalgia 09/27/2014  . Nocturnal leg cramps 09/27/2014  . Body mass index (BMI) of 30.0-30.9 in adult 08/04/2014  . Neuropathy 03/10/2014  . Allergic rhinitis 03/10/2014  . Osteoporosis 03/10/2014  . Spinal stenosis of lumbar region with radiculopathy 02/28/2014  . Type II or unspecified type diabetes mellitus without mention of complication, uncontrolled 07/08/2013  . Hypokalemia 11/02/2012  . Essential hypertension, benign 11/02/2012  . Diabetes mellitus with neuropathy ([place]) 10/29/2012  . Hyperlipidemia 10/29/2012  . Spondylolisthesis of lumbar region 03/19/2011  . Lumbar radicular pain 03/19/2011    Scot Jun, PTA 08/06/2017, 12:26 PM  [place], [place], [address] Phone: [phone]   Fax:  [phone]  Name: [name] MRN: [id number] Date of Birth: [birth year]

## 2017-08-11 ENCOUNTER — Ambulatory Visit (INDEPENDENT_AMBULATORY_CARE_PROVIDER_SITE_OTHER): Payer: Medicare Other

## 2017-08-11 ENCOUNTER — Ambulatory Visit (INDEPENDENT_AMBULATORY_CARE_PROVIDER_SITE_OTHER): Payer: Medicare Other | Admitting: Rheumatology

## 2017-08-11 ENCOUNTER — Encounter: Payer: Self-pay | Admitting: Rheumatology

## 2017-08-11 VITALS — BP 121/76 | HR 73 | Resp 15 | Ht 65.0 in | Wt 180.0 lb

## 2017-08-11 DIAGNOSIS — M25561 Pain in right knee: Secondary | ICD-10-CM

## 2017-08-11 DIAGNOSIS — Z8639 Personal history of other endocrine, nutritional and metabolic disease: Secondary | ICD-10-CM

## 2017-08-11 DIAGNOSIS — Z8669 Personal history of other diseases of the nervous system and sense organs: Secondary | ICD-10-CM | POA: Diagnosis not present

## 2017-08-11 DIAGNOSIS — M810 Age-related osteoporosis without current pathological fracture: Secondary | ICD-10-CM

## 2017-08-11 DIAGNOSIS — M5136 Other intervertebral disc degeneration, lumbar region: Secondary | ICD-10-CM

## 2017-08-11 DIAGNOSIS — M19042 Primary osteoarthritis, left hand: Secondary | ICD-10-CM | POA: Diagnosis not present

## 2017-08-11 DIAGNOSIS — Z8679 Personal history of other diseases of the circulatory system: Secondary | ICD-10-CM | POA: Diagnosis not present

## 2017-08-11 DIAGNOSIS — E785 Hyperlipidemia, unspecified: Secondary | ICD-10-CM | POA: Diagnosis not present

## 2017-08-11 DIAGNOSIS — M797 Fibromyalgia: Secondary | ICD-10-CM

## 2017-08-11 DIAGNOSIS — G8929 Other chronic pain: Secondary | ICD-10-CM

## 2017-08-11 DIAGNOSIS — M7062 Trochanteric bursitis, left hip: Secondary | ICD-10-CM

## 2017-08-11 DIAGNOSIS — G4709 Other insomnia: Secondary | ICD-10-CM | POA: Diagnosis not present

## 2017-08-11 DIAGNOSIS — M19041 Primary osteoarthritis, right hand: Secondary | ICD-10-CM | POA: Diagnosis not present

## 2017-08-11 MED ORDER — TRIAMCINOLONE ACETONIDE 40 MG/ML IJ SUSP
40.0000 mg | INTRAMUSCULAR | Status: AC | PRN
  Administered 2017-08-11: 40 mg via INTRA_ARTICULAR

## 2017-08-11 MED ORDER — LIDOCAINE HCL 1 % IJ SOLN
1.5000 mL | INTRAMUSCULAR | Status: AC | PRN
  Administered 2017-08-11: 1.5 mL

## 2017-08-11 NOTE — Patient Instructions (Signed)

## 2017-08-12 ENCOUNTER — Encounter: Payer: Self-pay | Admitting: Physical Therapy

## 2017-08-12 ENCOUNTER — Ambulatory Visit: Payer: Medicare Other | Admitting: Physical Therapy

## 2017-08-12 DIAGNOSIS — M6281 Muscle weakness (generalized): Secondary | ICD-10-CM

## 2017-08-12 DIAGNOSIS — M545 Low back pain, unspecified: Secondary | ICD-10-CM

## 2017-08-12 DIAGNOSIS — R262 Difficulty in walking, not elsewhere classified: Secondary | ICD-10-CM | POA: Diagnosis not present

## 2017-08-12 NOTE — Therapy (Signed)
[place], [place], [address] Phone: [phone]   Fax:  [phone]  Physical Therapy Treatment  Patient Details  Name: [name] MRN: [id number] Date of Birth: 03-20-1941 Referring Provider: Toula Moos   Encounter Date: 08/12/2017  PT End of Session - 08/12/17 1225    Visit Number  15    Date for PT Re-Evaluation  08/23/17    PT Start Time  1153    PT Stop Time  1228    PT Time Calculation (min)  35 min    Activity Tolerance  Patient tolerated treatment well    Behavior During Therapy  [facility] for tasks assessed/performed       Past Medical History:  Diagnosis Date  . Acute blood loss anemia   . Acute encephalopathy 07/15/2016  . AKI (acute kidney injury) ([place])   . Anemia    has sickle cell trait  . Anxiety   . Arthritis   . Asthma    has used inhaler in past for asthmatic bronchitis, last time- early 2012  . Bilateral primary osteoarthritis of knee 03/26/2016  . Complication of anesthesia    wakes up shaking  . Diabetes mellitus   . Diabetes mellitus with neuropathy ([place]) 10/29/2012  . Diverticulitis   . Dyslipidemia   . Encephalopathy 06/2016   due to medications after surgery  . Fibromyalgia   . GERD (gastroesophageal reflux disease)    occas. use of  Prilosec  . Heart murmur    sees Dr. Montez Morita, last seen- early 2012  . Herniated nucleus pulposus, L2-3 06/25/2016  . History of back surgery   . Hypertension    02/2010- stress test /w PCP  . Hypothyroidism   . Loose bowel movements 12/2016  . Lumbar radiculopathy 06/27/2016  . Lumbar stenosis with neurogenic claudication 03/17/2017  . Memory disorder 02/16/2016  . Myoclonic jerking   . Neuromuscular disorder (HCC)    lumbar radiculopathy, lumbago  . Nocturnal leg cramps 09/27/2014  . Osteoporosis 03/10/2014  . Pneumonia   . Sickle cell trait ([place])   . Sleep apnea    borderline sleep apnea, states she no longer  uses, early 2012- stopped using   . Spinal stenosis of lumbar region with radiculopathy 02/28/2014  . Spondylolisthesis of lumbar region 03/19/2011  . Type II or unspecified type diabetes mellitus without mention of complication, uncontrolled 07/08/2013    Past Surgical History:  Procedure Laterality Date  . ABDOMINAL HYSTERECTOMY    . adb.cyst     ovarian cyst  . BACK SURGERY     2012, 2015 (3 total)  . BACK SURGERY  2018   01/2017 and 03/17/2017  . COLONOSCOPY    . EYE SURGERY     macular degeneration treatment - injections  . OVARIAN CYST SURGERY    . POSTERIOR LUMBAR FUSION 4 LEVEL N/A 03/17/2017   Procedure: Decompression of Lumbar One-Two with Thoracic Ten to Lumbar Two Fusion;  Surgeon: Kristeen Miss, MD;  Location: [facility];  Service: Neurosurgery;  Laterality: N/A;  Decompression of L1-2 with T10 to L2 Fusion    There were no vitals filed for this visit.  Subjective Assessment - 08/12/17 1158    Subjective  Pt reports getting a shot in her knee yesterday. "I hurt" Pt reports going to 3 different churches this Sunday.     Currently in Pain?  Yes    Pain Score  7     Pain Location  Back & thighs                       [facility] Adult PT Treatment/Exercise - 08/12/17 0001      Lumbar Exercises: Aerobic   Nustep  L5 6 mins      Lumbar Exercises: Standing   Shoulder Extension  Theraband;Both;15 reps    Theraband Level (Shoulder Extension)  Level 3 (Green)      Lumbar Exercises: Seated   Sit to Stand  10 reps x2 No UE    Other Seated Lumbar Exercises  ab sets with pball 2x10; tband rowa blue 2x15; HS curls yellow tband x15 then x10    Other Seated Lumbar Exercises  green tband rows 2x15               PT Short Term Goals - 07/30/17 1104      PT SHORT TERM GOAL #4   Title  The patient will report neck pain < or equal to 3/10 (from baseline of 5/10).        PT Long Term Goals - 08/06/17 1226      PT LONG TERM GOAL #1   Title  decrease pain 50%     Status  On-going      PT LONG TERM GOAL #2   Title  get up from sitting without pain and without hands    Status  Achieved      PT LONG TERM GOAL #3   Title  increase lumbar ROM 25%    Status  Achieved      PT LONG TERM GOAL #4   Title  walk 1000 feet    Status  Partially Met            Plan - 08/12/17 1225    Clinical Impression Statement  Pt ~ 8 minutes late for today's treatment. She reports increase pain due to being on her feet more for easter sunday. She also stated she didn't do her exercises last night so she was not limber up. More activities in sitting dur to pt receiving a shot yesterday. Reports some pain in her incision with seated rows. Good strength with sit to stand and stabilizing.     Rehab Potential  Good    PT Frequency  2x / week    PT Duration  8 weeks    PT Treatment/Interventions  ADLs/Self Care Home Management;Cryotherapy;Electrical Stimulation;Moist Heat;Neuromuscular re-education;Therapeutic exercise;Therapeutic activities;Functional mobility training;Stair training;Patient/family education;Manual techniques    PT Next Visit Plan  Continue with strengthening and functional activity with a focus on posture awareness       Patient will benefit from skilled therapeutic intervention in order to improve the following deficits and impairments:  Abnormal gait, Decreased range of motion, Difficulty walking, Decreased endurance, Increased muscle spasms, Decreased activity tolerance, Pain, Impaired flexibility, Improper body mechanics, Postural dysfunction, Decreased strength, Decreased mobility  Visit Diagnosis: Muscle weakness (generalized)  Difficulty in walking, not elsewhere classified  Acute bilateral low back pain without sciatica     Problem List Patient Active Problem List   Diagnosis Date Noted  . Acute blood loss as cause of postoperative anemia 03/30/2017  . Diabetic polyneuropathy associated with secondary diabetes mellitus ([place])  03/30/2017  . Asthma 03/30/2017  . Dyslipidemia 03/26/2017  . Lumbar stenosis with neurogenic claudication 03/17/2017  . Spinal stenosis of lumbar region 03/03/2017  . Myoclonic jerking   . Nausea   . Acute encephalopathy 07/15/2016  . Lumbar radiculopathy 06/27/2016  .  Hypothyroidism 06/26/2016  . Labile blood glucose   . Surgery, elective   . Post-operative pain   . Sickle cell trait ([place])   . Acute blood loss anemia   . History of back surgery   . AKI (acute kidney injury) ([place])   . Herniated nucleus pulposus, L2-3 06/25/2016  . Bilateral primary osteoarthritis of knee 03/26/2016  . Osteoarthritis, hand 03/26/2016  . Other insomnia 03/26/2016  . Memory disorder 02/16/2016  . Fibromyalgia 09/27/2014  . Nocturnal leg cramps 09/27/2014  . Body mass index (BMI) of 30.0-30.9 in adult 08/04/2014  . Neuropathy 03/10/2014  . Allergic rhinitis 03/10/2014  . Osteoporosis 03/10/2014  . Spinal stenosis of lumbar region with radiculopathy 02/28/2014  . Type II or unspecified type diabetes mellitus without mention of complication, uncontrolled 07/08/2013  . Hypokalemia 11/02/2012  . Essential hypertension, benign 11/02/2012  . Diabetes mellitus with neuropathy ([place]) 10/29/2012  . Hyperlipidemia 10/29/2012  . Spondylolisthesis of lumbar region 03/19/2011  . Lumbar radicular pain 03/19/2011    Scot Jun, PTA 08/12/2017, 12:28 PM  [place], [place], [address] Phone: [phone]   Fax:  [phone]  Name: [name] MRN: [id number] Date of Birth: 1941/01/06

## 2017-08-13 ENCOUNTER — Encounter: Payer: Self-pay | Admitting: Physical Therapy

## 2017-08-13 ENCOUNTER — Ambulatory Visit: Payer: Medicare Other | Admitting: Physical Therapy

## 2017-08-13 DIAGNOSIS — M545 Low back pain, unspecified: Secondary | ICD-10-CM

## 2017-08-13 DIAGNOSIS — M6281 Muscle weakness (generalized): Secondary | ICD-10-CM

## 2017-08-13 DIAGNOSIS — R262 Difficulty in walking, not elsewhere classified: Secondary | ICD-10-CM

## 2017-08-13 NOTE — Therapy (Signed)
[place], [place], [address] Phone: [phone]   Fax:  [phone]  Physical Therapy Treatment  Patient Details  Name: [name] MRN: [id number] Date of Birth: [birth year] Referring Provider: Toula Moos   Encounter Date: 08/13/2017  PT End of Session - 08/13/17 1342    Visit Number  15    Date for PT Re-Evaluation  08/23/17    PT Start Time  1448    PT Stop Time  1352    PT Time Calculation (min)  54 min    Activity Tolerance  Patient tolerated treatment well    Behavior During Therapy  [facility] for tasks assessed/performed       Past Medical History:  Diagnosis Date  . Acute blood loss anemia   . Acute encephalopathy 07/15/2016  . AKI (acute kidney injury) ([place])   . Anemia    has sickle cell trait  . Anxiety   . Arthritis   . Asthma    has used inhaler in past for asthmatic bronchitis, last time- early 2012  . Bilateral primary osteoarthritis of knee 03/26/2016  . Complication of anesthesia    wakes up shaking  . Diabetes mellitus   . Diabetes mellitus with neuropathy ([place]) 10/29/2012  . Diverticulitis   . Dyslipidemia   . Encephalopathy 06/2016   due to medications after surgery  . Fibromyalgia   . GERD (gastroesophageal reflux disease)    occas. use of  Prilosec  . Heart murmur    sees Dr. Montez Morita, last seen- early 2012  . Herniated nucleus pulposus, L2-3 06/25/2016  . History of back surgery   . Hypertension    02/2010- stress test /w PCP  . Hypothyroidism   . Loose bowel movements 12/2016  . Lumbar radiculopathy 06/27/2016  . Lumbar stenosis with neurogenic claudication 03/17/2017  . Memory disorder 02/16/2016  . Myoclonic jerking   . Neuromuscular disorder (HCC)    lumbar radiculopathy, lumbago  . Nocturnal leg cramps 09/27/2014  . Osteoporosis 03/10/2014  . Pneumonia   . Sickle cell trait ([place])   . Sleep apnea    borderline sleep apnea, states she no longer  uses, early 2012- stopped using   . Spinal stenosis of lumbar region with radiculopathy 02/28/2014  . Spondylolisthesis of lumbar region 03/19/2011  . Type II or unspecified type diabetes mellitus without mention of complication, uncontrolled 07/08/2013    Past Surgical History:  Procedure Laterality Date  . ABDOMINAL HYSTERECTOMY    . adb.cyst     ovarian cyst  . BACK SURGERY     2012, 2015 (3 total)  . BACK SURGERY  2018   01/2017 and 03/17/2017  . COLONOSCOPY    . EYE SURGERY     macular degeneration treatment - injections  . OVARIAN CYST SURGERY    . POSTERIOR LUMBAR FUSION 4 LEVEL N/A 03/17/2017   Procedure: Decompression of Lumbar One-Two with Thoracic Ten to Lumbar Two Fusion;  Surgeon: Kristeen Miss, MD;  Location: [facility];  Service: Neurosurgery;  Laterality: N/A;  Decompression of L1-2 with T10 to L2 Fusion    There were no vitals filed for this visit.  Subjective Assessment - 08/13/17 1301    Subjective  pt reports that feels like her R knee is giving way.    Currently in Pain?  Yes    Pain Score  6     Pain Location  Knee    Pain Orientation  Right  Pain Onset  More than a month ago                       [facility] Adult PT Treatment/Exercise - 08/13/17 0001      Lumbar Exercises: Aerobic   Nustep  L5 100mn       Lumbar Exercises: Machines for Strengthening   Other Lumbar Machine Exercise  15lb 2x15 lat pull and rows    Other Lumbar Machine Exercise  lumbar flex blackband 2x10       Lumbar Exercises: Seated   Other Seated Lumbar Exercises  ab sets with pball 1x20; tband row green 2x10; seated rotation 2x10       Moist Heat Therapy   Number Minutes Moist Heat  15 Minutes    Moist Heat Location  Lumbar Spine      Electrical Stimulation   Electrical Stimulation Location  Low back     Electrical Stimulation Action  IFC    Electrical Stimulation Parameters  sitting    Electrical Stimulation Goals  Pain               PT Short Term Goals  - 07/30/17 1104      PT SHORT TERM GOAL #4   Title  The patient will report neck pain < or equal to 3/10 (from baseline of 5/10).        PT Long Term Goals - 08/06/17 1226      PT LONG TERM GOAL #1   Title  decrease pain 50%    Status  On-going      PT LONG TERM GOAL #2   Title  get up from sitting without pain and without hands    Status  Achieved      PT LONG TERM GOAL #3   Title  increase lumbar ROM 25%    Status  Achieved      PT LONG TERM GOAL #4   Title  walk 1000 feet    Status  Partially Met            Plan - 08/13/17 1343    Clinical Impression Statement  Pt verbally reported that she wasn't feeling well today and that her knee was giving out. Therapeutic exercises focused on seated exercises today. pt required vc for posture awareness.     Rehab Potential  Good    PT Frequency  2x / week    PT Duration  8 weeks    PT Treatment/Interventions  ADLs/Self Care Home Management;Cryotherapy;Electrical Stimulation;Moist Heat;Neuromuscular re-education;Therapeutic exercise;Therapeutic activities;Functional mobility training;Stair training;Patient/family education;Manual techniques    PT Next Visit Plan  Continue with strengthening and functional activity with a focus on posture awareness       Patient will benefit from skilled therapeutic intervention in order to improve the following deficits and impairments:  Abnormal gait, Decreased range of motion, Difficulty walking, Decreased endurance, Increased muscle spasms, Decreased activity tolerance, Pain, Impaired flexibility, Improper body mechanics, Postural dysfunction, Decreased strength, Decreased mobility  Visit Diagnosis: Muscle weakness (generalized)  Difficulty in walking, not elsewhere classified  Acute bilateral low back pain without sciatica     Problem List Patient Active Problem List   Diagnosis Date Noted  . Acute blood loss as cause of postoperative anemia 03/30/2017  . Diabetic polyneuropathy  associated with secondary diabetes mellitus ([place] 03/30/2017  . Asthma 03/30/2017  . Dyslipidemia 03/26/2017  . Lumbar stenosis with neurogenic claudication 03/17/2017  . Spinal stenosis of lumbar region 03/03/2017  . Myoclonic  jerking   . Nausea   . Acute encephalopathy 07/15/2016  . Lumbar radiculopathy 06/27/2016  . Hypothyroidism 06/26/2016  . Labile blood glucose   . Surgery, elective   . Post-operative pain   . Sickle cell trait ([place])   . Acute blood loss anemia   . History of back surgery   . AKI (acute kidney injury) ([place])   . Herniated nucleus pulposus, L2-3 06/25/2016  . Bilateral primary osteoarthritis of knee 03/26/2016  . Osteoarthritis, hand 03/26/2016  . Other insomnia 03/26/2016  . Memory disorder 02/16/2016  . Fibromyalgia 09/27/2014  . Nocturnal leg cramps 09/27/2014  . Body mass index (BMI) of 30.0-30.9 in adult 08/04/2014  . Neuropathy 03/10/2014  . Allergic rhinitis 03/10/2014  . Osteoporosis 03/10/2014  . Spinal stenosis of lumbar region with radiculopathy 02/28/2014  . Type II or unspecified type diabetes mellitus without mention of complication, uncontrolled 07/08/2013  . Hypokalemia 11/02/2012  . Essential hypertension, benign 11/02/2012  . Diabetes mellitus with neuropathy ([place]) 10/29/2012  . Hyperlipidemia 10/29/2012  . Spondylolisthesis of lumbar region 03/19/2011  . Lumbar radicular pain 03/19/2011    Loyal Gambler 08/13/2017, 1:50 PM  [place], [place], [address] Phone: [phone]   Fax:  [phone]  Name: [name] MRN: [id number] Date of Birth: 1940/12/08

## 2017-08-14 ENCOUNTER — Other Ambulatory Visit (INDEPENDENT_AMBULATORY_CARE_PROVIDER_SITE_OTHER): Payer: Medicare Other

## 2017-08-14 DIAGNOSIS — E1165 Type 2 diabetes mellitus with hyperglycemia: Secondary | ICD-10-CM

## 2017-08-14 DIAGNOSIS — Z794 Long term (current) use of insulin: Secondary | ICD-10-CM | POA: Diagnosis not present

## 2017-08-14 LAB — COMPREHENSIVE METABOLIC PANEL
ALT: 13 U/L (ref 0–35)
AST: 16 U/L (ref 0–37)
Albumin: 4 g/dL (ref 3.5–5.2)
Alkaline Phosphatase: 82 U/L (ref 39–117)
BILIRUBIN TOTAL: 0.5 mg/dL (ref 0.2–1.2)
BUN: 15 mg/dL (ref 6–23)
CO2: 29 mEq/L (ref 19–32)
CREATININE: 0.94 mg/dL (ref 0.40–1.20)
Calcium: 9.9 mg/dL (ref 8.4–10.5)
Chloride: 102 mEq/L (ref 96–112)
GFR: 74.13 mL/min (ref >60.00)
GLUCOSE: 137 mg/dL — AB (ref 70–99)
Potassium: 3.7 mEq/L (ref 3.5–5.1)
Sodium: 139 mEq/L (ref 135–145)
TOTAL PROTEIN: 7.3 g/dL (ref 6.0–8.3)

## 2017-08-14 LAB — HEMOGLOBIN A1C: Hgb A1c MFr Bld: 6.3 % (ref 4.6–6.5)

## 2017-08-14 LAB — TSH: TSH: 2.21 u[IU]/mL (ref 0.35–4.50)

## 2017-08-15 ENCOUNTER — Other Ambulatory Visit: Payer: Self-pay | Admitting: Neurology

## 2017-08-15 ENCOUNTER — Other Ambulatory Visit: Payer: Self-pay | Admitting: Endocrinology

## 2017-08-18 ENCOUNTER — Encounter: Payer: Self-pay | Admitting: Endocrinology

## 2017-08-18 ENCOUNTER — Encounter (INDEPENDENT_AMBULATORY_CARE_PROVIDER_SITE_OTHER): Payer: Self-pay

## 2017-08-18 ENCOUNTER — Ambulatory Visit (INDEPENDENT_AMBULATORY_CARE_PROVIDER_SITE_OTHER): Payer: Medicare Other | Admitting: Endocrinology

## 2017-08-18 VITALS — BP 120/62 | HR 85 | Ht 65.0 in | Wt 177.0 lb

## 2017-08-18 DIAGNOSIS — R252 Cramp and spasm: Secondary | ICD-10-CM | POA: Diagnosis not present

## 2017-08-18 DIAGNOSIS — I10 Essential (primary) hypertension: Secondary | ICD-10-CM | POA: Diagnosis not present

## 2017-08-18 DIAGNOSIS — E1140 Type 2 diabetes mellitus with diabetic neuropathy, unspecified: Secondary | ICD-10-CM

## 2017-08-18 DIAGNOSIS — Z794 Long term (current) use of insulin: Secondary | ICD-10-CM | POA: Diagnosis not present

## 2017-08-18 DIAGNOSIS — E782 Mixed hyperlipidemia: Secondary | ICD-10-CM | POA: Diagnosis not present

## 2017-08-18 MED ORDER — IRBESARTAN 300 MG PO TABS: 300.0000 mg | ORAL_TABLET | Freq: Every day | ORAL | 2 refills | Status: DC

## 2017-08-18 MED ORDER — EZETIMIBE 10 MG PO TABS
ORAL_TABLET | ORAL | 1 refills | Status: DC
Start: 2017-08-18 — End: 2017-12-09

## 2017-08-18 NOTE — Patient Instructions (Signed)
Check blood sugars on waking up 3/7   Also check blood sugars about 2 hours after a meal and do this after different meals by rotation  Recommended blood sugar levels on waking up is 90-130 and about 2 hours after meal is 130-160  Please bring your blood sugar monitor to each visit, thank you  

## 2017-08-18 NOTE — Progress Notes (Signed)
Patient ID: [name], female   DOB: 10/16/1940, 77 y.o.   MRN: [id number]    Reason for Appointment:    follow-up  History of Present Illness    PROBLEM 1: Type 2 diabetes mellitus, date of diagnosis: 1998.   Prior history: She had previously been treated with Byetta, Glumetza, Amaryl, Victoza and  Onglyza However because of inadequate control and intolerance to drugs she was finally given pre-meal insulin along with Amaryl, Glumetza eventually stopped because of side effects and also Lantus added  Recent history:  The insulin regimen is: Novolog 6-8 at breakfast and 10 lunch,  acs Tresiba 24 at lunch  Oral agents: None  Her A1c has been mostly between about 6.2 -7.4, last level 6.3  Current blood sugar patterns and problems with management:  She has checked her sugars very sporadically and only 10 readings in the last month  Most of her readings are before her first meal and these are usually fairly good  She was previously on a Nutrisystem's diet and she thinks her fasting readings went down to 55 with this  Currently not remembering to check readings after meals but readings around midday are near normal  She is only getting some physical therapy and not starting water aerobics as yet  Again has difficulty losing weight and has gained some since her last visit   Had previously tried Victoza which caused nausea  Monitors blood glucose: 1-2 times a day, readings as below Glucometer: One Touch.    Mean values apply above for all meters except median for One Touch  PRE-MEAL Fasting Lunch Dinner Bedtime Overall  Glucose range:  113-178      Mean/median:  121  148   120   POST-MEAL PC Breakfast PC Lunch PC Dinner  Glucose range:   ?  Mean/median:       Meals: 2-3 meals per day at 10 am. 2 pm and 6-7 pm but inconsistent schedule.  Breakfast may be [place] sausage and egg with toast, occasionally oatmeal or cereal    Physical activity: exercise:   PT, Off Water aerobics, previously doing 3/7 days a week  Certified Diabetes Educator visit: Most recent: 7/13.  Dietician visit: Most recent: 3/13.   Wt Readings from Last 3 Encounters:  08/18/17 177 lb (80.3 kg)  08/11/17 180 lb (81.6 kg)  07/08/17 178 lb (37.1 kg)   Complications: Neuropathy  LABS:  Lab Results  Component Value Date   HGBA1C 6.3 08/14/2017   HGBA1C 6.5 (H) 03/12/2017   HGBA1C 6.2 01/13/2017   Lab Results  Component Value Date   MICROALBUR <0.7 01/13/2017   LDLCALC 102 (H) 05/16/2017   CREATININE 0.94 08/14/2017    Multiple other issues are addressed in review of systems  Lab on 08/14/2017  Component Date Value Ref Range Status  . TSH 08/14/2017 2.21  0.35 - 4.50 uIU/mL Final  . Sodium 08/14/2017 139  135 - 145 mEq/L Final  . Potassium 08/14/2017 3.7  3.5 - 5.1 mEq/L Final  . Chloride 08/14/2017 102  96 - 112 mEq/L Final  . CO2 08/14/2017 29  19 - 32 mEq/L Final  . Glucose, Bld 08/14/2017 137* 70 - 99 mg/dL Final  . BUN 08/14/2017 15  6 - 23 mg/dL Final  . Creatinine, Ser 08/14/2017 0.94  0.40 - 1.20 mg/dL Final  . Total Bilirubin 08/14/2017 0.5  0.2 - 1.2 mg/dL Final  . Alkaline Phosphatase 08/14/2017 82  39 - 117 U/L Final  .  AST 08/14/2017 16  0 - 37 U/L Final  . ALT 08/14/2017 13  0 - 35 U/L Final  . Total Protein 08/14/2017 7.3  6.0 - 8.3 g/dL Final  . Albumin 08/14/2017 4.0  3.5 - 5.2 g/dL Final  . Calcium 08/14/2017 9.9  8.4 - 10.5 mg/dL Final  . GFR 08/14/2017 74.13  >60.00 mL/min Final  . Hgb A1c MFr Bld 08/14/2017 6.3  4.6 - 6.5 % Final   Glycemic Control Guidelines for People with Diabetes:Non Diabetic:  <6%Goal of Therapy: <7%Additional Action Suggested:  >8%       Allergies as of 08/18/2017      Reactions   Cymbalta [duloxetine Hcl] Diarrhea, Other (See Comments)   Dizziness, headache, irritability   Baclofen    jerks    Betadine [povidone Iodine] Swelling, Other (See Comments)   SWELLING REACTION DESCRIPTION/SEVERITY  UNSPECIFIED  Reaction to betadine eye drops   Adhesive [tape] Rash   Lipitor [atorvastatin] Rash      Medication List        Accurate as of 08/18/17  3:37 PM. Always use your most recent med list.          acetaminophen 500 MG tablet Commonly known as:  TYLENOL Take 1,000 mg by mouth every 8 (eight) hours as needed for mild pain or moderate pain.   aspirin 81 MG EC tablet Take 49m by mouth once daily   BD PEN NEEDLE NANO U/F 32G X 4 MM Misc Generic drug:  Insulin Pen Needle USE AS DIRECTED   CAL-MAG-ZINC PO Take 1 tablet by mouth daily.   cetirizine 10 MG tablet Commonly known as:  ZYRTEC Take 10 mg by mouth daily.   fluticasone 50 MCG/ACT nasal spray Commonly known as:  FLONASE Place 1 spray into both nostrils daily.   Fluticasone Furoate 100 MCG/ACT Aepb Commonly known as:  ARNUITY ELLIPTA Inhale 1 puff into the lungs daily.   gabapentin 300 MG capsule Commonly known as:  NEURONTIN One capsule in the morning and noon, take 2 at night   ibuprofen 800 MG tablet Commonly known as:  ADVIL,MOTRIN Take 800 mg every 8 (eight) hours as needed by mouth for mild pain or moderate pain.   levothyroxine 25 MCG tablet Commonly known as:  SYNTHROID, LEVOTHROID TAKE 1 TABLET(25 MCG) BY MOUTH DAILY BEFORE BREAKFAST   MAGNESIUM PO Take 250 mg by mouth daily.   NOVOLOG FLEXPEN 100 UNIT/ML FlexPen Generic drug:  insulin aspart INJECT 6 UNITS UNDER THE SKIN BEFORE BREAKFAST, 10 UNITS BEFORE LUNCH, AND 9 UNITS BEFORE SUPPER   potassium chloride 10 MEQ tablet Commonly known as:  K-DUR,KLOR-CON TAKE 2 TABLETS(20 MEQ) BY MOUTH TWICE DAILY   pregabalin 50 MG capsule Commonly known as:  LYRICA Take 50 mg by mouth as needed.   PROAIR RESPICLICK 1616(90 Base) MCG/ACT Aepb Generic drug:  Albuterol Sulfate Inhale 2 puffs into the lungs 3 (three) times daily as needed (shortness of breath).   pyridoxine 100 MG tablet Commonly known as:  B-6 Take 100 mg daily by mouth.     SYMBICORT 80-4.5 MCG/ACT inhaler Generic drug:  budesonide-formoterol Inhale 2 puffs into the lungs 2 (two) times daily as needed (shortness of breath).   TRESIBA FLEXTOUCH 100 UNIT/ML Sopn FlexTouch Pen Generic drug:  insulin degludec INJECT 24 UNITS UNDER THE SKIN DAILY   vitamin B-12 100 MCG tablet Commonly known as:  CYANOCOBALAMIN Take 100 mcg daily by mouth.   vitamin E 400 UNIT capsule Take 400 Units by  mouth daily.       Allergies:  Allergies  Allergen Reactions  . Cymbalta [Duloxetine Hcl] Diarrhea and Other (See Comments)    Dizziness, headache, irritability  . Baclofen     jerks   . Betadine [Povidone Iodine] Swelling and Other (See Comments)    SWELLING REACTION DESCRIPTION/SEVERITY UNSPECIFIED  Reaction to betadine eye drops  . Adhesive [Tape] Rash  . Lipitor [Atorvastatin] Rash    Past Medical History:  Diagnosis Date  . Acute blood loss anemia   . Acute encephalopathy 07/15/2016  . AKI (acute kidney injury) ([place])   . Anemia    has sickle cell trait  . Anxiety   . Arthritis   . Asthma    has used inhaler in past for asthmatic bronchitis, last time- early 2012  . Bilateral primary osteoarthritis of knee 03/26/2016  . Complication of anesthesia    wakes up shaking  . Diabetes mellitus   . Diabetes mellitus with neuropathy ([place]) 10/29/2012  . Diverticulitis   . Dyslipidemia   . Encephalopathy 06/2016   due to medications after surgery  . Fibromyalgia   . GERD (gastroesophageal reflux disease)    occas. use of  Prilosec  . Heart murmur    sees Dr. Montez Morita, last seen- early 2012  . Herniated nucleus pulposus, L2-3 06/25/2016  . History of back surgery   . Hypertension    02/2010- stress test /w PCP  . Hypothyroidism   . Loose bowel movements 12/2016  . Lumbar radiculopathy 06/27/2016  . Lumbar stenosis with neurogenic claudication 03/17/2017  . Memory disorder 02/16/2016  . Myoclonic jerking   . Neuromuscular disorder (HCC)    lumbar  radiculopathy, lumbago  . Nocturnal leg cramps 09/27/2014  . Osteoporosis 03/10/2014  . Pneumonia   . Sickle cell trait ([place])   . Sleep apnea    borderline sleep apnea, states she no longer uses, early 2012- stopped using   . Spinal stenosis of lumbar region with radiculopathy 02/28/2014  . Spondylolisthesis of lumbar region 03/19/2011  . Type II or unspecified type diabetes mellitus without mention of complication, uncontrolled 07/08/2013    Past Surgical History:  Procedure Laterality Date  . ABDOMINAL HYSTERECTOMY    . adb.cyst     ovarian cyst  . BACK SURGERY     2012, 2015 (3 total)  . BACK SURGERY  2018   01/2017 and 03/17/2017  . COLONOSCOPY    . EYE SURGERY     macular degeneration treatment - injections  . OVARIAN CYST SURGERY    . POSTERIOR LUMBAR FUSION 4 LEVEL N/A 03/17/2017   Procedure: Decompression of Lumbar One-Two with Thoracic Ten to Lumbar Two Fusion;  Surgeon: Kristeen Miss, MD;  Location: [facility];  Service: Neurosurgery;  Laterality: N/A;  Decompression of L1-2 with T10 to L2 Fusion    Family History  Problem Relation Age of Onset  . Ovarian cancer Mother   . Cancer - Prostate Father   . Breast cancer Paternal Aunt   . Multiple myeloma Paternal Aunt   . Anesthesia problems Neg Hx   . Hypotension Neg Hx   . Malignant hyperthermia Neg Hx   . Pseudochol deficiency Neg Hx     Social History:  reports that she quit smoking about 18 years ago. She has never used smokeless tobacco. She reports that she drinks alcohol. She reports that she does not use drugs.  ROS    NEUROPATHY: She has had tingling in feet and legs especially at night.  She is taking gabapentin without consistent relief at night At times she will take Lyrica with somewhat better relief at night This was also prescribed as needed for fibromyalgia by her rheumatologist  MUSCLE cramps: She is having less problems now with getting Gatorade and electrolyte solutions Her PCP told her to stop Zetia  also but she is not clear if this made a difference   Hyperlipidemia:   The lipid abnormality consists of elevated LDL , high triglyceride and did not tolerate Crestor or lovastatin She thinks they make her cramps worse Diet is usually low in fat meats  She is off Zetia, previously did not seem to have any muscle cramps and her LDL had come down about 55 mg with this    Lab Results  Component Value Date   CHOL 182 05/16/2017   HDL 50.30 05/16/2017   LDLCALC 102 (H) 05/16/2017   LDLDIRECT 115.0 12/18/2016   TRIG 147.0 05/16/2017   CHOLHDL 4 05/16/2017      HYPERTENSION:  Has been present for several years.  This is  controlled with taking Avalide currently  Does not monitor at home   Lab Results  Component Value Date   CREATININE 0.94 08/14/2017   BUN 15 08/14/2017   NA 139 08/14/2017   K 3.7 08/14/2017   CL 102 08/14/2017   CO2 29 08/14/2017       Hypothyroidism  She had a relatively high TSH as of 4/17 and was empirically given Synthroid 25 g  Not clear if her fatigue had improved with this due to other problems but she is feeling reasonably good and TSH is slightly better  TSH is as follows   Lab Results  Component Value Date   TSH 2.21 08/14/2017   TSH 3.41 12/18/2016   TSH 2.14 08/09/2016   FREET4 0.78 02/22/2016         Examination:   BP 120/62 (BP Location: Left Arm, Patient Position: Sitting, Cuff Size: Normal)   Pulse 85   Ht '5\' 5"'  (1.651 m)   Wt 177 lb (80.3 kg)   SpO2 95%   BMI 29.45 kg/m   Body mass index is 29.45 kg/m.   No pedal edema present  Assesment/Plan:   1. DIABETES type 2 with BMI of 28 See history of present illness for detailed discussion of management, blood sugar patterns and problems identified  Her blood sugars are generally fairly well controlled with A1c 6.3  This is with low-dose basal bolus insulin regimen She is not checking readings very often but most of her fasting readings are fairly good She tries to  adjust her mealtime doses based on what she is eating but not clear if her readings are consistently high after meals No recent hypoglycemia  She will continue the same regimen but discussed that if she is going on a Nutrisystem diet she will reduce her Tresiba by 4 units and NovoLog by 2 to 4 units also Discussed need to check readings after meals consistently which she is not doing Explained to her that the [employer] system is not available unless she is documenting checking 4 times a day She does need to start back on water aerobics which will help her with weight loss  3. Hypertension: Blood pressure is excellent today with taking Avalide She wants to avoid diuretics because of muscle cramps and potassium is 3.7 She would try taking just Avapro and see if her cramps are reduced but needs to monitor blood pressure  4.  Hyperlipidemia:  Needs to retry her Zetia, LDL had improved significantly with this and she will see if she has any worsening of muscle cramps with this.   5.  Muscle cramps: These are idiopathic and explained to her that the Zetia is not a statin drug and she can try other remedies for cramps which appear to be effective, also as above she can try leaving off HCTZ    6.  Mild hypothyroidism: Improved TSH with low-dose levothyroxine   There are no Patient Instructions on file for this visit.     Total visit time for evaluation and management of multiple problems and counseling =25 minutes  [name] 08/18/2017, 3:37 PM

## 2017-08-19 ENCOUNTER — Encounter: Payer: Self-pay | Admitting: Physical Therapy

## 2017-08-19 ENCOUNTER — Ambulatory Visit: Payer: Medicare Other | Admitting: Physical Therapy

## 2017-08-19 DIAGNOSIS — M545 Low back pain, unspecified: Secondary | ICD-10-CM

## 2017-08-19 DIAGNOSIS — R262 Difficulty in walking, not elsewhere classified: Secondary | ICD-10-CM

## 2017-08-19 DIAGNOSIS — M6281 Muscle weakness (generalized): Secondary | ICD-10-CM

## 2017-08-19 NOTE — Therapy (Signed)
[facility] [place], [place], [address] Phone: [phone]   Fax:  [phone]  Physical Therapy Treatment  Patient Details  Name: [name] MRN: [id number] Date of Birth: 09-04-1940 Referring Provider: Toula Moos   Encounter Date: 08/19/2017  PT End of Session - 08/19/17 1226    Visit Number  16    Date for PT Re-Evaluation  08/23/17    PT Start Time  1151    PT Stop Time  1241    PT Time Calculation (min)  50 min    Activity Tolerance  Patient tolerated treatment well    Behavior During Therapy  [facility] for tasks assessed/performed       Past Medical History:  Diagnosis Date  . Acute blood loss anemia   . Acute encephalopathy 07/15/2016  . AKI (acute kidney injury) ([place])   . Anemia    has sickle cell trait  . Anxiety   . Arthritis   . Asthma    has used inhaler in past for asthmatic bronchitis, last time- early 2012  . Bilateral primary osteoarthritis of knee 03/26/2016  . Complication of anesthesia    wakes up shaking  . Diabetes mellitus   . Diabetes mellitus with neuropathy ([place]) 10/29/2012  . Diverticulitis   . Dyslipidemia   . Encephalopathy 06/2016   due to medications after surgery  . Fibromyalgia   . GERD (gastroesophageal reflux disease)    occas. use of  Prilosec  . Heart murmur    sees Dr. Montez Morita, last seen- early 2012  . Herniated nucleus pulposus, L2-3 06/25/2016  . History of back surgery   . Hypertension    02/2010- stress test /w PCP  . Hypothyroidism   . Loose bowel movements 12/2016  . Lumbar radiculopathy 06/27/2016  . Lumbar stenosis with neurogenic claudication 03/17/2017  . Memory disorder 02/16/2016  . Myoclonic jerking   . Neuromuscular disorder (HCC)    lumbar radiculopathy, lumbago  . Nocturnal leg cramps 09/27/2014  . Osteoporosis 03/10/2014  . Pneumonia   . Sickle cell trait ([place])   . Sleep apnea    borderline sleep apnea, states she no longer  uses, early 2012- stopped using   . Spinal stenosis of lumbar region with radiculopathy 02/28/2014  . Spondylolisthesis of lumbar region 03/19/2011  . Type II or unspecified type diabetes mellitus without mention of complication, uncontrolled 07/08/2013    Past Surgical History:  Procedure Laterality Date  . ABDOMINAL HYSTERECTOMY    . adb.cyst     ovarian cyst  . BACK SURGERY     2012, 2015 (3 total)  . BACK SURGERY  2018   01/2017 and 03/17/2017  . COLONOSCOPY    . EYE SURGERY     macular degeneration treatment - injections  . OVARIAN CYST SURGERY    . POSTERIOR LUMBAR FUSION 4 LEVEL N/A 03/17/2017   Procedure: Decompression of Lumbar One-Two with Thoracic Ten to Lumbar Two Fusion;  Surgeon: Kristeen Miss, MD;  Location: [facility];  Service: Neurosurgery;  Laterality: N/A;  Decompression of L1-2 with T10 to L2 Fusion    There were no vitals filed for this visit.  Subjective Assessment - 08/19/17 1154    Subjective  "I am feeling ok, I hurt all the time"    Currently in Pain?  Yes    Pain Score  6     Pain Location  Back    Pain Orientation  Right  [facility] Adult PT Treatment/Exercise - 08/19/17 0001      Lumbar Exercises: Aerobic   Nustep  L5 35mn       Lumbar Exercises: Machines for Strengthening   Other Lumbar Machine Exercise  15lb 2x15 lat pull and rows      Lumbar Exercises: Seated   Other Seated Lumbar Exercises  ab sets with pball 2x10 3''       Moist Heat Therapy   Moist Heat Location  Lumbar Spine      Electrical Stimulation   Electrical Stimulation Location  Low back     Electrical Stimulation Action  IFC    Electrical Stimulation Parameters  sitting    Electrical Stimulation Goals  Pain               PT Short Term Goals - 07/30/17 1104      PT SHORT TERM GOAL #4   Title  The patient will report neck pain < or equal to 3/10 (from baseline of 5/10).        PT Long Term Goals - 08/19/17 1209      PT LONG  TERM GOAL #1   Title  decrease pain 50%    Status  Partially Met      PT LONG TERM GOAL #2   Title  get up from sitting without pain and without hands    Status  Achieved      PT LONG TERM GOAL #3   Title  increase lumbar ROM 25%    Status  Achieved      PT LONG TERM GOAL #4   Title  walk 1000 feet    Status  Partially Met            Plan - 08/19/17 1226    Clinical Impression Statement  Pt ~ 6 minutes late for today's treatment session. All of today's exercises completed well. Pt reports functionally she is ok but just in constant low back pain. cues throughout treatment to stand up straight and not lean forward.     Rehab Potential  Good    PT Frequency  2x / week    PT Duration  8 weeks    PT Treatment/Interventions  ADLs/Self Care Home Management;Cryotherapy;Electrical Stimulation;Moist Heat;Neuromuscular re-education;Therapeutic exercise;Therapeutic activities;Functional mobility training;Stair training;Patient/family education;Manual techniques    PT Next Visit Plan  D/C PT       Patient will benefit from skilled therapeutic intervention in order to improve the following deficits and impairments:  Abnormal gait, Decreased range of motion, Difficulty walking, Decreased endurance, Increased muscle spasms, Decreased activity tolerance, Pain, Impaired flexibility, Improper body mechanics, Postural dysfunction, Decreased strength, Decreased mobility  Visit Diagnosis: Difficulty in walking, not elsewhere classified  Muscle weakness (generalized)  Acute bilateral low back pain without sciatica     Problem List Patient Active Problem List   Diagnosis Date Noted  . Acute blood loss as cause of postoperative anemia 03/30/2017  . Diabetic polyneuropathy associated with secondary diabetes mellitus ([place] 03/30/2017  . Asthma 03/30/2017  . Dyslipidemia 03/26/2017  . Lumbar stenosis with neurogenic claudication 03/17/2017  . Spinal stenosis of lumbar region 03/03/2017  .  Myoclonic jerking   . Nausea   . Acute encephalopathy 07/15/2016  . Lumbar radiculopathy 06/27/2016  . Hypothyroidism 06/26/2016  . Labile blood glucose   . Surgery, elective   . Post-operative pain   . Sickle cell trait ([place]   . Acute blood loss anemia   . History of back surgery   .  AKI (acute kidney injury) ([place])   . Herniated nucleus pulposus, L2-3 06/25/2016  . Bilateral primary osteoarthritis of knee 03/26/2016  . Osteoarthritis, hand 03/26/2016  . Other insomnia 03/26/2016  . Memory disorder 02/16/2016  . Fibromyalgia 09/27/2014  . Nocturnal leg cramps 09/27/2014  . Body mass index (BMI) of 30.0-30.9 in adult 08/04/2014  . Neuropathy 03/10/2014  . Allergic rhinitis 03/10/2014  . Osteoporosis 03/10/2014  . Spinal stenosis of lumbar region with radiculopathy 02/28/2014  . Type II or unspecified type diabetes mellitus without mention of complication, uncontrolled 07/08/2013  . Hypokalemia 11/02/2012  . Essential hypertension, benign 11/02/2012  . Diabetes mellitus with neuropathy ([place]) 10/29/2012  . Hyperlipidemia 10/29/2012  . Spondylolisthesis of lumbar region 03/19/2011  . Lumbar radicular pain 03/19/2011   PHYSICAL THERAPY DISCHARGE SUMMARY  Visits from Start of Care: 16 Plan: Patient agrees to discharge.  Patient goals were partially met. Patient is being discharged due to lack of progress.  ?????      Scot Jun, PTA 08/19/2017, 12:29 PM  [place], [place], [address] Phone: [phone]   Fax:  [phone]  Name: [name] MRN: [id number] Date of Birth: 1941-02-29

## 2017-08-20 ENCOUNTER — Other Ambulatory Visit: Payer: Self-pay | Admitting: Endocrinology

## 2017-08-27 ENCOUNTER — Encounter: Payer: Self-pay | Admitting: Endocrinology

## 2017-08-27 DIAGNOSIS — M48062 Spinal stenosis, lumbar region with neurogenic claudication: Secondary | ICD-10-CM | POA: Diagnosis not present

## 2017-08-27 DIAGNOSIS — I10 Essential (primary) hypertension: Secondary | ICD-10-CM | POA: Diagnosis not present

## 2017-08-27 DIAGNOSIS — Z6832 Body mass index (BMI) 32.0-32.9, adult: Secondary | ICD-10-CM | POA: Diagnosis not present

## 2017-08-28 DIAGNOSIS — H31011 Macula scars of posterior pole (postinflammatory) (post-traumatic), right eye: Secondary | ICD-10-CM | POA: Diagnosis not present

## 2017-08-28 DIAGNOSIS — Z961 Presence of intraocular lens: Secondary | ICD-10-CM | POA: Diagnosis not present

## 2017-08-28 DIAGNOSIS — E119 Type 2 diabetes mellitus without complications: Secondary | ICD-10-CM | POA: Diagnosis not present

## 2017-08-28 DIAGNOSIS — Z794 Long term (current) use of insulin: Secondary | ICD-10-CM | POA: Diagnosis not present

## 2017-09-02 DIAGNOSIS — H3561 Retinal hemorrhage, right eye: Secondary | ICD-10-CM | POA: Diagnosis not present

## 2017-09-02 DIAGNOSIS — H353114 Nonexudative age-related macular degeneration, right eye, advanced atrophic with subfoveal involvement: Secondary | ICD-10-CM | POA: Diagnosis not present

## 2017-09-02 DIAGNOSIS — H353122 Nonexudative age-related macular degeneration, left eye, intermediate dry stage: Secondary | ICD-10-CM | POA: Diagnosis not present

## 2017-09-02 DIAGNOSIS — H353212 Exudative age-related macular degeneration, right eye, with inactive choroidal neovascularization: Secondary | ICD-10-CM | POA: Diagnosis not present

## 2017-09-11 ENCOUNTER — Encounter: Payer: Self-pay | Admitting: Endocrinology

## 2017-09-19 ENCOUNTER — Other Ambulatory Visit: Payer: Self-pay | Admitting: Neurology

## 2017-10-02 ENCOUNTER — Other Ambulatory Visit: Payer: Self-pay | Admitting: Endocrinology

## 2017-12-08 NOTE — Progress Notes (Signed)
Office Visit Note  Patient: [name]             Date of Birth: 08/16/1940           MRN: [id number]             PCP: Wenda Low, MD Referring: Wenda Low, MD Visit Date: 12/09/2017 Occupation: '@GUAROCC' @  Subjective:  Neck pain and right knee pain.  History of Present Illness: [name] is a 77 y.o. female with history of fibromyalgia, osteoarthritis, osteoporosis, and DDD. She is taking Gabapentin 300 mg 1 tablet in the AM, 1 tablet at noon, and 2 tablets in the evening.  She takes Lyrica 50 mg po prn.  Having discomfort in her cervical spine.  She also continues to have pain in her right knee joint.  She denies any joint swelling.  Activities of Daily Living:  Patient reports morning stiffness for 30  minutes.   Patient Denies nocturnal pain.  Difficulty dressing/grooming: Denies Difficulty climbing stairs: Reports Difficulty getting out of chair: Reports Difficulty using hands for taps, buttons, cutlery, and/or writing: Denies  Review of Systems  Constitutional: Positive for fatigue. Negative for night sweats, weight gain and weight loss.  HENT: Negative for mouth sores, trouble swallowing, trouble swallowing, mouth dryness and nose dryness.   Eyes: Negative for pain, redness, visual disturbance and dryness.  Respiratory: Positive for shortness of breath. Negative for cough, hemoptysis and difficulty breathing.   Cardiovascular: Positive for hypertension. Negative for chest pain, palpitations, irregular heartbeat and swelling in legs/feet.  Gastrointestinal: Negative for blood in stool, constipation and diarrhea.  Endocrine: Negative for increased urination.  Genitourinary: Negative for painful urination and vaginal dryness.  Musculoskeletal: Positive for arthralgias, joint pain and morning stiffness. Negative for joint swelling, myalgias, muscle weakness, muscle tenderness and myalgias.  Skin: Negative for color change, pallor, rash, hair loss,  nodules/bumps, skin tightness, ulcers and sensitivity to sunlight.  Allergic/Immunologic: Negative for susceptible to infections.  Neurological: Negative for dizziness, numbness, headaches, memory loss, night sweats and weakness.  Hematological: Negative for swollen glands.  Psychiatric/Behavioral: Positive for sleep disturbance. Negative for depressed mood. The patient is not nervous/anxious.     PMFS History:  Patient Active Problem List   Diagnosis Date Noted  . Acute blood loss as cause of postoperative anemia 03/30/2017  . Diabetic polyneuropathy associated with secondary diabetes mellitus ([place]) 03/30/2017  . Asthma 03/30/2017  . Dyslipidemia 03/26/2017  . Lumbar stenosis with neurogenic claudication 03/17/2017  . Spinal stenosis of lumbar region 03/03/2017  . Myoclonic jerking   . Nausea   . Acute encephalopathy 07/15/2016  . Lumbar radiculopathy 06/27/2016  . Hypothyroidism 06/26/2016  . Labile blood glucose   . Surgery, elective   . Post-operative pain   . Sickle cell trait ([place])   . Acute blood loss anemia   . History of back surgery   . AKI (acute kidney injury) ([place])   . Herniated nucleus pulposus, L2-3 06/25/2016  . Bilateral primary osteoarthritis of knee 03/26/2016  . Osteoarthritis, hand 03/26/2016  . Other insomnia 03/26/2016  . Memory disorder 02/16/2016  . Fibromyalgia 09/27/2014  . Nocturnal leg cramps 09/27/2014  . Body mass index (BMI) of 30.0-30.9 in adult 08/04/2014  . Neuropathy 03/10/2014  . Allergic rhinitis 03/10/2014  . Osteoporosis 03/10/2014  . Spinal stenosis of lumbar region with radiculopathy 02/28/2014  . Type II or unspecified type diabetes mellitus without mention of complication, uncontrolled 07/08/2013  . Hypokalemia 11/02/2012  . Essential hypertension, benign 11/02/2012  .  Diabetes mellitus with neuropathy ([place]) 10/29/2012  . Hyperlipidemia 10/29/2012  . Spondylolisthesis of lumbar region 03/19/2011  . Lumbar radicular pain  03/19/2011    Past Medical History:  Diagnosis Date  . Acute blood loss anemia   . Acute encephalopathy 07/15/2016  . AKI (acute kidney injury) ([place])   . Anemia    has sickle cell trait  . Anxiety   . Arthritis   . Asthma    has used inhaler in past for asthmatic bronchitis, last time- early 2012  . Bilateral primary osteoarthritis of knee 03/26/2016  . Complication of anesthesia    wakes up shaking  . Diabetes mellitus   . Diabetes mellitus with neuropathy ([place]) 10/29/2012  . Diverticulitis   . Dyslipidemia   . Encephalopathy 06/2016   due to medications after surgery  . Fibromyalgia   . GERD (gastroesophageal reflux disease)    occas. use of  Prilosec  . Heart murmur    sees Dr. Montez Morita, last seen- early 2012  . Herniated nucleus pulposus, L2-3 06/25/2016  . History of back surgery   . Hypertension    02/2010- stress test /w PCP  . Hypothyroidism   . Loose bowel movements 12/2016  . Lumbar radiculopathy 06/27/2016  . Lumbar stenosis with neurogenic claudication 03/17/2017  . Memory disorder 02/16/2016  . Myoclonic jerking   . Neuromuscular disorder ([facility])    lumbar radiculopathy, lumbago  . Nocturnal leg cramps 09/27/2014  . Osteoporosis 03/10/2014  . Pneumonia   . Sickle cell trait ([place])   . Sleep apnea    borderline sleep apnea, states she no longer uses, early 2012- stopped using   . Spinal stenosis of lumbar region with radiculopathy 02/28/2014  . Spondylolisthesis of lumbar region 03/19/2011  . Type II or unspecified type diabetes mellitus without mention of complication, uncontrolled 07/08/2013    Family History  Problem Relation Age of Onset  . Ovarian cancer Mother   . Cancer - Prostate Father   . Breast cancer Paternal Aunt   . Multiple myeloma Paternal Aunt   . Anesthesia problems Neg Hx   . Hypotension Neg Hx   . Malignant hyperthermia Neg Hx   . Pseudochol deficiency Neg Hx    Past Surgical History:  Procedure Laterality Date  . ABDOMINAL HYSTERECTOMY     . adb.cyst     ovarian cyst  . BACK SURGERY     2012, 2015 (3 total)  . BACK SURGERY  2018   02/24/2017  . COLONOSCOPY    . EYE SURGERY     macular degeneration treatment - injections  . OVARIAN CYST SURGERY    . POSTERIOR LUMBAR FUSION 4 LEVEL N/A 03/17/2017   Procedure: Decompression of Lumbar One-Two with Thoracic Ten to Lumbar Two Fusion;  Surgeon: Kristeen Miss, MD;  Location: [facility];  Service: Neurosurgery;  Laterality: N/A;  Decompression of L1-2 with T10 to L2 Fusion   Social History   Social History Narrative   Patient drinks caffeine occasionally.   Patient is right handed.   Admitted to [facility] 03/21/17   Divorced    Former smoker - stopped 2000    Alcohol - occasionally wine at dinner   Full code    Objective: Vital Signs: BP 112/80 (BP Location: Left Arm, Patient Position: Sitting, Cuff Size: Normal)   Pulse 75   Resp 14   Ht '5\' 5"'  (1.651 m)   Wt 182 lb 3.2 oz (82.6 kg)   BMI 30.32 kg/m  Physical Exam  Constitutional: She is oriented to person, place, and time. She appears well-developed and well-nourished.  HENT:  Head: Normocephalic and atraumatic.  Eyes: Conjunctivae and EOM are normal.  Neck: Normal range of motion.  Cardiovascular: Normal rate, regular rhythm, normal heart sounds and intact distal pulses.  Pulmonary/Chest: Effort normal and breath sounds normal.  Abdominal: Soft. Bowel sounds are normal.  Lymphadenopathy:    She has no cervical adenopathy.  Neurological: She is alert and oriented to person, place, and time.  Skin: Skin is warm and dry. Capillary refill takes less than 2 seconds.  Psychiatric: She has a normal mood and affect. Her behavior is normal.  Nursing note and vitals reviewed.    Musculoskeletal Exam: C-spine limitation with range of motion.  Shoulder joints elbow joints wrist joint MCPs PIPs DIPs were in good range of motion with no synovitis.  She has PIP and DIP thickening in her hands.  She had  tenderness and discomfort with range of motion of her right knee joint without any warmth swelling or effusion.  All other joints are full range of motion with no synovitis.  CDAI Exam: CDAI Score: Not documented Patient Global Assessment: Not documented; Provider Global Assessment: Not documented Swollen: Not documented; Tender: Not documented Joint Exam   Not documented   There is currently no information documented on the homunculus. Go to the Rheumatology activity and complete the homunculus joint exam.  Investigation: No additional findings.  Imaging: No results found.  Recent Labs: Lab Results  Component Value Date   WBC 6.2 03/29/2017   HGB 9.9 (A) 03/29/2017   PLT 235 03/29/2017   NA 139 08/14/2017   K 3.7 08/14/2017   CL 102 08/14/2017   CO2 29 08/14/2017   GLUCOSE 137 (H) 08/14/2017   BUN 15 08/14/2017   CREATININE 0.94 08/14/2017   BILITOT 0.5 08/14/2017   ALKPHOS 82 08/14/2017   AST 16 08/14/2017   ALT 13 08/14/2017   PROT 7.3 08/14/2017   ALBUMIN 4.0 08/14/2017   CALCIUM 9.9 08/14/2017   GFRAA >60 03/18/2017    Speciality Comments: No specialty comments available.  Procedures:  Large Joint Inj: R knee on 12/09/2017 2:37 PM Indications: pain Details: 27 G 1.5 in needle, medial approach  Arthrogram: No  Medications: 40 mg triamcinolone acetonide 40 MG/ML; 1.5 mL lidocaine 1 % Aspirate: 0 mL Outcome: tolerated well, no immediate complications Procedure, treatment alternatives, risks and benefits explained, specific risks discussed. Consent was given by the patient. Immediately prior to procedure a time out was called to verify the correct patient, procedure, equipment, support staff and site/side marked as required. Patient was prepped and draped in the usual sterile fashion.     Allergies: Cymbalta [duloxetine hcl]; Baclofen; Betadine [povidone iodine]; Adhesive [tape]; and Lipitor [atorvastatin]   Assessment / Plan:     Visit Diagnoses:  Fibromyalgia-she continues to have some generalized pain and discomfort.  She is on gabapentin and muscle relaxers.  Primary osteoarthritis of both hands-joint protection muscle strengthening was discussed.  Primary osteoarthritis of right knee -she has severe end-stage osteoarthritis in her knee joints and has chondromalacia patella.  She is moving to [place] and would like to have a cortisone injection prior to her move.  Side effects were discussed.  She is diabetic.  I have advised to monitor blood sugar and blood pressure closely.  Plan: Large Joint Inj: R knee.  The procedure as described above.  Neck pain - Plan: XR Cervical Spine 2 or  3 views.  He was consistent with disc multilevel spondylosis with C5-6 and C6-7 narrowing.  She has no radiculopathy.  A handout on C-spine exercises was given.  DDD (degenerative disc disease), lumbar - s/p fusion x3.  She is currently not having much discomfort.  Other insomnia-good sleep hygiene was discussed.  Age-related osteoporosis without current pathological fracture - Ttd with Fosamax in the past by her PCP.  The patient has been getting DEXA through her PCP.  Dyslipidemia  History of hypertension  History of neuropathy  History of sleep apnea  History of diabetes mellitus  History of hypothyroidism   Orders: Orders Placed This Encounter  Procedures  . Large Joint Inj: R knee  . XR Cervical Spine 2 or 3 views   No orders of the defined types were placed in this encounter.   Face-to-face time spent with patient was 30 minutes. Greater than 50% of time was spent in counseling and coordination of care.  She does moving to [place] and will establish with a rheumatologist there.  Follow-Up Instructions: Return in about 6 months (around 06/11/2018), or if symptoms worsen or fail to improve, for Fibromyalgia, Osteoarthritis, DDD.   Bo Merino, MD  Note - This record has been created using Editor, commissioning.  Chart  creation errors have been sought, but may not always  have been located. Such creation errors do not reflect on  the standard of medical care.

## 2017-12-09 ENCOUNTER — Encounter: Payer: Self-pay | Admitting: Rheumatology

## 2017-12-09 ENCOUNTER — Ambulatory Visit (INDEPENDENT_AMBULATORY_CARE_PROVIDER_SITE_OTHER): Payer: Medicare Other

## 2017-12-09 ENCOUNTER — Ambulatory Visit (INDEPENDENT_AMBULATORY_CARE_PROVIDER_SITE_OTHER): Payer: Medicare Other | Admitting: Rheumatology

## 2017-12-09 VITALS — BP 112/80 | HR 75 | Resp 14 | Ht 65.0 in | Wt 182.2 lb

## 2017-12-09 DIAGNOSIS — E785 Hyperlipidemia, unspecified: Secondary | ICD-10-CM | POA: Diagnosis not present

## 2017-12-09 DIAGNOSIS — M19041 Primary osteoarthritis, right hand: Secondary | ICD-10-CM | POA: Diagnosis not present

## 2017-12-09 DIAGNOSIS — Z8639 Personal history of other endocrine, nutritional and metabolic disease: Secondary | ICD-10-CM

## 2017-12-09 DIAGNOSIS — G4709 Other insomnia: Secondary | ICD-10-CM | POA: Diagnosis not present

## 2017-12-09 DIAGNOSIS — M542 Cervicalgia: Secondary | ICD-10-CM

## 2017-12-09 DIAGNOSIS — M810 Age-related osteoporosis without current pathological fracture: Secondary | ICD-10-CM | POA: Diagnosis not present

## 2017-12-09 DIAGNOSIS — M5136 Other intervertebral disc degeneration, lumbar region: Secondary | ICD-10-CM

## 2017-12-09 DIAGNOSIS — Z8669 Personal history of other diseases of the nervous system and sense organs: Secondary | ICD-10-CM

## 2017-12-09 DIAGNOSIS — Z8679 Personal history of other diseases of the circulatory system: Secondary | ICD-10-CM | POA: Diagnosis not present

## 2017-12-09 DIAGNOSIS — M797 Fibromyalgia: Secondary | ICD-10-CM

## 2017-12-09 DIAGNOSIS — M51369 Other intervertebral disc degeneration, lumbar region without mention of lumbar back pain or lower extremity pain: Secondary | ICD-10-CM

## 2017-12-09 DIAGNOSIS — M1711 Unilateral primary osteoarthritis, right knee: Secondary | ICD-10-CM | POA: Diagnosis not present

## 2017-12-09 DIAGNOSIS — M19042 Primary osteoarthritis, left hand: Secondary | ICD-10-CM

## 2017-12-09 MED ORDER — LIDOCAINE HCL 1 % IJ SOLN
1.5000 mL | INTRAMUSCULAR | Status: AC | PRN
  Administered 2017-12-09: 1.5 mL

## 2017-12-09 MED ORDER — TRIAMCINOLONE ACETONIDE 40 MG/ML IJ SUSP
40.0000 mg | INTRAMUSCULAR | Status: AC | PRN
  Administered 2017-12-09: 40 mg via INTRA_ARTICULAR

## 2017-12-09 NOTE — Patient Instructions (Signed)
Cervical Strain and Sprain Rehab Ask your health care provider which exercises are safe for you. Do exercises exactly as told by your health care provider and adjust them as directed. It is normal to feel mild stretching, pulling, tightness, or discomfort as you do these exercises, but you should stop right away if you feel sudden pain or your pain gets worse.Do not begin these exercises until told by your health care provider. Stretching and range of motion exercises These exercises warm up your muscles and joints and improve the movement and flexibility of your neck. These exercises also help to relieve pain, numbness, and tingling. Exercise A: Cervical side bend  1. Using good posture, sit on a stable chair or stand up. 2. Without moving your shoulders, slowly tilt your left / right ear to your shoulder until you feel a stretch in your neck muscles. You should be looking straight ahead. 3. Hold for __________ seconds. 4. Repeat with the other side of your neck. Repeat __________ times. Complete this exercise __________ times a day. Exercise B: Cervical rotation  1. Using good posture, sit on a stable chair or stand up. 2. Slowly turn your head to the side as if you are looking over your left / right shoulder. ? Keep your eyes level with the ground. ? Stop when you feel a stretch along the side and the back of your neck. 3. Hold for __________ seconds. 4. Repeat this by turning to your other side. Repeat __________ times. Complete this exercise __________ times a day. Exercise C: Thoracic extension and pectoral stretch 1. Roll a towel or a small blanket so it is about 4 inches (10 cm) in diameter. 2. Lie down on your back on a firm surface. 3. Put the towel lengthwise, under your spine in the middle of your back. It should not be not under your shoulder blades. The towel should line up with your spine from your middle back to your lower back. 4. Put your hands behind your head and let your  elbows fall out to your sides. 5. Hold for __________ seconds. Repeat __________ times. Complete this exercise __________ times a day. Strengthening exercises These exercises build strength and endurance in your neck. Endurance is the ability to use your muscles for a long time, even after your muscles get tired. Exercise D: Upper cervical flexion, isometric 1. Lie on your back with a thin pillow behind your head and a small rolled-up towel under your neck. 2. Gently tuck your chin toward your chest and nod your head down to look toward your feet. Do not lift your head off the pillow. 3. Hold for __________ seconds. 4. Release the tension slowly. Relax your neck muscles completely before you repeat this exercise. Repeat __________ times. Complete this exercise __________ times a day. Exercise E: Cervical extension, isometric  1. Stand about 6 inches (15 cm) away from a wall, with your back facing the wall. 2. Place a soft object, about 6-8 inches (15-20 cm) in diameter, between the back of your head and the wall. A soft object could be a small pillow, a ball, or a folded towel. 3. Gently tilt your head back and press into the soft object. Keep your jaw and forehead relaxed. 4. Hold for __________ seconds. 5. Release the tension slowly. Relax your neck muscles completely before you repeat this exercise. Repeat __________ times. Complete this exercise __________ times a day. Posture and body mechanics  Body mechanics refers to the movements and positions of   your body while you do your daily activities. Posture is part of body mechanics. Good posture and healthy body mechanics can help to relieve stress in your body's tissues and joints. Good posture means that your spine is in its natural S-curve position (your spine is neutral), your shoulders are pulled back slightly, and your head is not tipped forward. The following are general guidelines for applying improved posture and body mechanics to  your everyday activities. Standing  When standing, keep your spine neutral and keep your feet about hip-width apart. Keep a slight bend in your knees. Your ears, shoulders, and hips should line up.  When you do a task in which you stand in one place for a long time, place one foot up on a stable object that is 2-4 inches (5-10 cm) high, such as a footstool. This helps keep your spine neutral. Sitting   When sitting, keep your spine neutral and your keep feet flat on the floor. Use a footrest, if necessary, and keep your thighs parallel to the floor. Avoid rounding your shoulders, and avoid tilting your head forward.  When working at a desk or a computer, keep your desk at a height where your hands are slightly lower than your elbows. Slide your chair under your desk so you are close enough to maintain good posture.  When working at a computer, place your monitor at a height where you are looking straight ahead and you do not have to tilt your head forward or downward to look at the screen. Resting When lying down and resting, avoid positions that are most painful for you. Try to support your neck in a neutral position. You can use a contour pillow or a small rolled-up towel. Your pillow should support your neck but not push on it. This information is not intended to replace advice given to you by your health care provider. Make sure you discuss any questions you have with your health care provider. Document Released: 04/08/2005 Document Revised: 12/14/2015 Document Reviewed: 03/15/2015 Elsevier Interactive Patient Education  2018 [employer]  

## 2017-12-11 DIAGNOSIS — Z1231 Encounter for screening mammogram for malignant neoplasm of breast: Secondary | ICD-10-CM | POA: Diagnosis not present

## 2017-12-22 ENCOUNTER — Other Ambulatory Visit: Payer: Self-pay | Admitting: Endocrinology

## 2017-12-31 ENCOUNTER — Telehealth: Payer: Self-pay | Admitting: Rheumatology

## 2017-12-31 NOTE — Telephone Encounter (Signed)
Patient called stating she has decided that she is not moving to [place] and wanted Dr. Estanislado Pandy to know.

## 2018-01-15 ENCOUNTER — Encounter: Payer: Self-pay | Admitting: Neurology

## 2018-01-15 ENCOUNTER — Ambulatory Visit (INDEPENDENT_AMBULATORY_CARE_PROVIDER_SITE_OTHER): Payer: Medicare Other | Admitting: Neurology

## 2018-01-15 VITALS — BP 142/70 | HR 75 | Ht 65.0 in | Wt 183.0 lb

## 2018-01-15 DIAGNOSIS — G4762 Sleep related leg cramps: Secondary | ICD-10-CM | POA: Diagnosis not present

## 2018-01-15 DIAGNOSIS — M797 Fibromyalgia: Secondary | ICD-10-CM

## 2018-01-15 MED ORDER — GABAPENTIN 600 MG PO TABS: 600.0000 mg | ORAL_TABLET | Freq: Three times a day (TID) | ORAL | 1 refills | Status: DC

## 2018-01-15 NOTE — Patient Instructions (Signed)
We will go up on the gabapentin to 600 mg three times a day.   Neurontin (gabapentin) may result in drowsiness, ankle swelling, gait instability, or possibly dizziness. Please contact our office if significant side effects occur with this medication.  

## 2018-01-15 NOTE — Progress Notes (Signed)
Reason for visit: Fibromyalgia, chronic low back pain, nocturnal leg cramps  [name] is an 77 y.o. female  History of present illness:  [name] is a 77 year old right-handed black female with a history of prior lumbosacral spine surgery, she continues to have low back pain and bilateral leg discomfort that is worse with standing and walking.  The patient claims she has burning pain in the muscle of her thighs and legs at all times.  She is followed by Dr. Estanislado Pandy for her fibromyalgia.  The patient has nocturnal leg cramps, she was not getting benefit from baclofen previously, she takes magnesium supplementation and potassium without complete benefit.  The patient claims that her headaches are not currently causing her problems, she may get an occasional headache.  She has not had any falls last seen, she uses a cane for ambulation.  She has been through physical therapy on 2 occasions with some benefit.  She returns to this office for an evaluation.  The patient remains on gabapentin taking 300 mg twice during the day and 600 mg at night, she tolerates this dose well.   Past Medical History:  Diagnosis Date  . Acute blood loss anemia   . Acute encephalopathy 07/15/2016  . AKI (acute kidney injury) ([facility])   . Anemia    has sickle cell trait  . Anxiety   . Arthritis   . Asthma    has used inhaler in past for asthmatic bronchitis, last time- early 2012  . Bilateral primary osteoarthritis of knee 03/26/2016  . Complication of anesthesia    wakes up shaking  . Diabetes mellitus   . Diabetes mellitus with neuropathy ([facility]) 10/29/2012  . Diverticulitis   . Dyslipidemia   . Encephalopathy 06/2016   due to medications after surgery  . Fibromyalgia   . GERD (gastroesophageal reflux disease)    occas. use of  Prilosec  . Heart murmur    sees Dr. Montez Morita, last seen- early 2012  . Herniated nucleus pulposus, L2-3 06/25/2016  . History of back surgery   . Hypertension    02/2010-  stress test /w PCP  . Hypothyroidism   . Loose bowel movements 12/2016  . Lumbar radiculopathy 06/27/2016  . Lumbar stenosis with neurogenic claudication 03/17/2017  . Memory disorder 02/16/2016  . Myoclonic jerking   . Neuromuscular disorder (HCC)    lumbar radiculopathy, lumbago  . Nocturnal leg cramps 09/27/2014  . Osteoporosis 03/10/2014  . Pneumonia   . Sickle cell trait ([place])   . Sleep apnea    borderline sleep apnea, states she no longer uses, early 2012- stopped using   . Spinal stenosis of lumbar region with radiculopathy 02/28/2014  . Spondylolisthesis of lumbar region 03/19/2011  . Type II or unspecified type diabetes mellitus without mention of complication, uncontrolled 07/08/2013    Past Surgical History:  Procedure Laterality Date  . ABDOMINAL HYSTERECTOMY    . adb.cyst     ovarian cyst  . BACK SURGERY     2012, 2015 (3 total)  . BACK SURGERY  2018   02/24/2017  . COLONOSCOPY    . EYE SURGERY     macular degeneration treatment - injections  . OVARIAN CYST SURGERY    . POSTERIOR LUMBAR FUSION 4 LEVEL N/A 03/17/2017   Procedure: Decompression of Lumbar One-Two with Thoracic Ten to Lumbar Two Fusion;  Surgeon: Kristeen Miss, MD;  Location: [facility];  Service: Neurosurgery;  Laterality: N/A;  Decompression of L1-2 with T10 to  L2 Fusion    Family History  Problem Relation Age of Onset  . Ovarian cancer Mother   . Cancer - Prostate Father   . Breast cancer Paternal Aunt   . Multiple myeloma Paternal Aunt   . Anesthesia problems Neg Hx   . Hypotension Neg Hx   . Malignant hyperthermia Neg Hx   . Pseudochol deficiency Neg Hx     Social history:  reports that she quit smoking about 18 years ago. She has never used smokeless tobacco. She reports that she drinks alcohol. She reports that she does not use drugs.    Allergies  Allergen Reactions  . Cymbalta [Duloxetine Hcl] Diarrhea and Other (See Comments)    Dizziness, headache, irritability  . Baclofen     jerks     . Betadine [Povidone Iodine] Swelling and Other (See Comments)    SWELLING REACTION DESCRIPTION/SEVERITY UNSPECIFIED  Reaction to betadine eye drops  . Adhesive [Tape] Rash  . Lipitor [Atorvastatin] Rash    Medications:  Prior to Admission medications   Medication Sig Start Date End Date Taking? Authorizing Provider  acetaminophen (TYLENOL) 500 MG tablet Take 1,000 mg by mouth every 8 (eight) hours as needed for mild pain or moderate pain.    Yes [provider]  aspirin 81 MG EC tablet Take 42m by mouth once daily 01/16/15  Yes [provider]  BD PEN NEEDLE NANO U/F 32G X 4 MM MISC USE AS DIRECTED 08/18/17  Yes KElayne Snare MD  cetirizine (ZYRTEC) 10 MG tablet Take 10 mg by mouth daily.   Yes [provider]  fluticasone (FLONASE) 50 MCG/ACT nasal spray Place 1 spray into both nostrils daily.   Yes [provider]  ibuprofen (ADVIL,MOTRIN) 800 MG tablet Take 800 mg every 8 (eight) hours as needed by mouth for mild pain or moderate pain.  08/09/16  Yes [provider]  irbesartan (AVAPRO) 300 MG tablet Take 1 tablet (300 mg total) by mouth daily. 08/18/17  Yes KElayne Snare MD  levothyroxine (SYNTHROID, LEVOTHROID) 25 MCG tablet TAKE 1 TABLET(25 MCG) BY MOUTH DAILY BEFORE BREAKFAST 02/20/17  Yes KElayne Snare MD  MAGNESIUM PO Take 250 mg by mouth 2 (two) times daily.    Yes [provider]  NOVOLOG FLEXPEN 100 UNIT/ML FlexPen INJECT 6 UNITS UNDER THE SKIN BEFORE BREAKFAST, 10 UNITS BEFORE LUNCH, AND 9 UNITS BEFORE SUPPER 12/22/17  Yes KElayne Snare MD  potassium chloride (K-DUR,KLOR-CON) 10 MEQ tablet TAKE 2 TABLETS(20 MEQ) BY MOUTH TWICE DAILY 01/27/17  Yes KElayne Snare MD  pregabalin (LYRICA) 50 MG capsule Take 50 mg by mouth as needed.   Yes [provider]  PROAIR RESPICLICK 1720(90 Base) MCG/ACT AEPB Inhale 2 puffs into the lungs 3 (three) times daily as needed (shortness of breath).  05/29/16  Yes [provider]  pyridoxine  (B-6) 100 MG tablet Take 100 mg daily by mouth.   Yes [provider]  SYMBICORT 80-4.5 MCG/ACT inhaler Inhale 2 puffs into the lungs 2 (two) times daily as needed (shortness of breath).  05/29/16  Yes [provider]  TRESIBA FLEXTOUCH 100 UNIT/ML SOPN FlexTouch Pen INJECT 24 UNITS UNDER THE SKIN ONCE DAILY 08/20/17  Yes KElayne Snare MD  vitamin B-12 (CYANOCOBALAMIN) 100 MCG tablet Take 100 mcg daily by mouth.    Yes [provider]  vitamin E 400 UNIT capsule Take 400 Units by mouth daily.   Yes [provider]  gabapentin (NEURONTIN) 600 MG tablet Take 1  tablet (600 mg total) by mouth 3 (three) times daily. 01/15/18   Kathrynn Ducking, MD    ROS:  Out of a complete 14 system review of symptoms, the patient complains only of the following symptoms, and all other reviewed systems are negative.  Fatigue, weight gain Hearing loss Cough, wheezing Excessive eating Swollen abdomen Incontinence of the bladder, urinary urgency Sleep apnea, snoring Aching muscles, muscle cramps, walking difficulty, neck pain, neck stiffness Weakness  Blood pressure (!) 142/70, pulse 75, height _0  (1.651 m), weight 183 lb (83 kg).  Physical Exam  General: The patient is alert and cooperative at the time of the examination.  The patient is moderately obese.  Neuromuscular: Internal and external rotation of the hips does not elicit discomfort.  Skin: No significant peripheral edema is noted.   Neurologic Exam  Mental status: The patient is alert and oriented x 3 at the time of the examination. The patient has apparent normal recent and remote memory, with an apparently normal attention span and concentration ability.   Cranial nerves: Facial symmetry is present. Speech is normal, no aphasia or dysarthria is noted. Extraocular movements are full. Visual fields are full.  Motor: The patient has good strength in all 4 extremities.  Sensory examination: Soft touch  sensation is symmetric on the face, arms, and legs.  Coordination: The patient has good finger-nose-finger and heel-to-shin bilaterally.  Gait and station: The patient has a slightly wide-based gait, she is slightly stooped with walking.  She usually walks with a cane.  Tandem gait was not attempted.  Romberg is negative.  Reflexes: Deep tendon reflexes are symmetric.   Assessment/Plan:  1.  Fibromyalgia  2.  Intermittent headache  3.  Nocturnal leg cramps  4.  Chronic back pain, leg pain  The patient will go up on the gabapentin taking 600 mg 3 times daily.  She will call for any dose adjustments.  If she is doing well, she will return in about 1 year.  Jill Alexanders MD 01/15/2018 2:06 PM  [facility] [address] [place], [place] [address]  Phone [phone] Fax [phone]

## 2018-01-20 DIAGNOSIS — J45909 Unspecified asthma, uncomplicated: Secondary | ICD-10-CM | POA: Diagnosis not present

## 2018-01-20 DIAGNOSIS — I10 Essential (primary) hypertension: Secondary | ICD-10-CM | POA: Diagnosis not present

## 2018-01-20 DIAGNOSIS — R252 Cramp and spasm: Secondary | ICD-10-CM | POA: Diagnosis not present

## 2018-01-20 DIAGNOSIS — B372 Candidiasis of skin and nail: Secondary | ICD-10-CM | POA: Diagnosis not present

## 2018-01-22 ENCOUNTER — Other Ambulatory Visit (INDEPENDENT_AMBULATORY_CARE_PROVIDER_SITE_OTHER): Payer: Medicare Other

## 2018-01-22 DIAGNOSIS — E1140 Type 2 diabetes mellitus with diabetic neuropathy, unspecified: Secondary | ICD-10-CM

## 2018-01-22 DIAGNOSIS — Z794 Long term (current) use of insulin: Secondary | ICD-10-CM | POA: Diagnosis not present

## 2018-01-22 LAB — LIPID PANEL
CHOL/HDL RATIO: 4
Cholesterol: 236 mg/dL — ABNORMAL HIGH (ref 0–200)
HDL: 60.1 mg/dL (ref >39.00)
LDL Cholesterol: 164 mg/dL — ABNORMAL HIGH (ref 0–99)
NONHDL: 176.09
Triglycerides: 62 mg/dL (ref 0.0–149.0)
VLDL: 12.4 mg/dL (ref 0.0–40.0)

## 2018-01-22 LAB — COMPREHENSIVE METABOLIC PANEL
ALK PHOS: 64 U/L (ref 39–117)
ALT: 14 U/L (ref 0–35)
AST: 20 U/L (ref 0–37)
Albumin: 4.1 g/dL (ref 3.5–5.2)
BILIRUBIN TOTAL: 0.6 mg/dL (ref 0.2–1.2)
BUN: 15 mg/dL (ref 6–23)
CALCIUM: 9.6 mg/dL (ref 8.4–10.5)
CO2: 32 meq/L (ref 19–32)
Chloride: 104 mEq/L (ref 96–112)
Creatinine, Ser: 0.98 mg/dL (ref 0.40–1.20)
GFR: 70.57 mL/min (ref >60.00)
GLUCOSE: 102 mg/dL — AB (ref 70–99)
Potassium: 3.7 mEq/L (ref 3.5–5.1)
Sodium: 141 mEq/L (ref 135–145)
Total Protein: 6.9 g/dL (ref 6.0–8.3)

## 2018-01-22 LAB — HEMOGLOBIN A1C: HEMOGLOBIN A1C: 6.4 % (ref 4.6–6.5)

## 2018-01-25 NOTE — Progress Notes (Signed)
Patient ID: [name], female   DOB: [birth year], 77 y.o.   MRN: [id number]    Reason for Appointment:    follow-up  History of Present Illness    PROBLEM 1: Type 2 diabetes mellitus, date of diagnosis: 1998.   Prior history: She had previously been treated with Byetta, Glumetza, Amaryl, Victoza and  Onglyza However because of inadequate control and intolerance to drugs she was finally given pre-meal insulin along with Amaryl, Glumetza eventually stopped because of side effects and also Lantus added  Recent history:  The insulin regimen is: Novolog 6-8 at breakfast and 10 lunch,  acs Tresiba 24 at lunch  Oral agents: None  Her A1c has been mostly between about 6.2 -7.4, last level 6.3  Current blood sugar patterns and problems with management:  She has checked her sugars very sporadically with a total of 6 readings in the last month  Also has been irregular with her follow-up  She says that she is preoccupied with trying to sell her house and other activities and does not plan her meals well at all  She may be also not taking NovoLog consistently  She cannot explain why her morning readings are high at home but not clear which of these are fasting  However fasting glucose in the lab at 11 AM was near normal  She has a couple of high readings after meals both lunch and dinner but not clear if this is a consistent pattern  She will sometimes eat sweets like ice cream at night instead of a meal  Also sometimes no protein at breakfast  She has gained a little weight although this has been fluctuating  She was previously on a Nutrisystem's diet and has not gone back to this  Had previously tried Victoza which caused nausea  Monitors blood glucose: 0--2 times a day, readings as below Glucometer: One Touch.    Morning 170, 225 Nonfasting midday 122-273 and evening 181  Median 176 with 6 readings  PRE-MEAL Fasting Lunch Dinner Bedtime Overall  Glucose  range:       Mean/median:     176   POST-MEAL PC Breakfast PC Lunch PC Dinner  Glucose range:     Mean/median:       Mean values apply above for all meters except median for One Touch  PRE-MEAL Fasting Lunch Dinner Bedtime Overall  Glucose range:  113-178      Mean/median:  121  148   120   POST-MEAL PC Breakfast PC Lunch PC Dinner  Glucose range:   ?  Mean/median:       Meals: 2-3 meals per day at 10 am. 2 pm and 6-7 pm but inconsistent schedule.  Breakfast may be [place] sausage and egg with toast, occasionally oatmeal or cereal   Physical activity: exercise:  Off Water aerobics, previously doing 3/7 days a week  Certified Diabetes Educator visit: Most recent: 7/13.  Dietician visit: Most recent: 3/13.   Wt Readings from Last 3 Encounters:  01/26/18 180 lb 6.4 oz (81.8 kg)  01/15/18 183 lb (83 kg)  12/09/17 182 lb 3.2 oz (24.0 kg)   Complications: Neuropathy  LABS:  Lab Results  Component Value Date   HGBA1C 6.4 01/22/2018   HGBA1C 6.3 08/14/2017   HGBA1C 6.5 (H) 03/12/2017   Lab Results  Component Value Date   MICROALBUR <0.7 01/13/2017   LDLCALC 164 (H) 01/22/2018   CREATININE 0.98 01/22/2018    Multiple other issues are addressed  in review of systems  Lab on 01/22/2018  Component Date Value Ref Range Status  . Sodium 01/22/2018 141  135 - 145 mEq/L Final  . Potassium 01/22/2018 3.7  3.5 - 5.1 mEq/L Final  . Chloride 01/22/2018 104  96 - 112 mEq/L Final  . CO2 01/22/2018 32  19 - 32 mEq/L Final  . Glucose, Bld 01/22/2018 102* 70 - 99 mg/dL Final  . BUN 01/22/2018 15  6 - 23 mg/dL Final  . Creatinine, Ser 01/22/2018 0.98  0.40 - 1.20 mg/dL Final  . Total Bilirubin 01/22/2018 0.6  0.2 - 1.2 mg/dL Final  . Alkaline Phosphatase 01/22/2018 64  39 - 117 U/L Final  . AST 01/22/2018 20  0 - 37 U/L Final  . ALT 01/22/2018 14  0 - 35 U/L Final  . Total Protein 01/22/2018 6.9  6.0 - 8.3 g/dL Final  . Albumin 01/22/2018 4.1  3.5 - 5.2 g/dL Final  . Calcium  01/22/2018 9.6  8.4 - 10.5 mg/dL Final  . GFR 01/22/2018 70.57  >60.00 mL/min Final  . Cholesterol 01/22/2018 236* 0 - 200 mg/dL Final   ATP III Classification       Desirable:  < 200 mg/dL               Borderline High:  200 - 239 mg/dL          High:  > = 240 mg/dL  . Triglycerides 01/22/2018 62.0  0.0 - 149.0 mg/dL Final   Normal:  <150 mg/dLBorderline High:  150 - 199 mg/dL  . HDL 01/22/2018 60.10  >39.00 mg/dL Final  . VLDL 01/22/2018 12.4  0.0 - 40.0 mg/dL Final  . LDL Cholesterol 01/22/2018 164* 0 - 99 mg/dL Final  . Total CHOL/HDL Ratio 01/22/2018 4   Final                  Men          Women1/2 Average Risk     3.4          3.3Average Risk          5.0          4.42X Average Risk          9.6          7.13X Average Risk          15.0          11.0                      . NonHDL 01/22/2018 176.09   Final   NOTE:  Non-HDL goal should be 30 mg/dL higher than patient's LDL goal (i.e. LDL goal of < 70 mg/dL, would have non-HDL goal of < 100 mg/dL)  . Hgb A1c MFr Bld 01/22/2018 6.4  4.6 - 6.5 % Final   Glycemic Control Guidelines for People with Diabetes:Non Diabetic:  <6%Goal of Therapy: <7%Additional Action Suggested:  >8%       Allergies as of 01/26/2018      Reactions   Cymbalta [duloxetine Hcl] Diarrhea, Other (See Comments)   Dizziness, headache, irritability   Baclofen    jerks    Betadine [povidone Iodine] Swelling, Other (See Comments)   SWELLING REACTION DESCRIPTION/SEVERITY UNSPECIFIED  Reaction to betadine eye drops   Adhesive [tape] Rash   Lipitor [atorvastatin] Rash      Medication List        Accurate as of 01/26/18  8:43 PM.  Always use your most recent med list.          acetaminophen 500 MG tablet Commonly known as:  TYLENOL Take 1,000 mg by mouth every 8 (eight) hours as needed for mild pain or moderate pain.   aspirin 81 MG EC tablet Take 62m by mouth once daily   BD PEN NEEDLE NANO U/F 32G X 4 MM Misc Generic drug:  Insulin Pen Needle USE AS  DIRECTED   cetirizine 10 MG tablet Commonly known as:  ZYRTEC Take 10 mg by mouth daily.   fluticasone 50 MCG/ACT nasal spray Commonly known as:  FLONASE Place 1 spray into both nostrils daily.   gabapentin 600 MG tablet Commonly known as:  NEURONTIN Take 1 tablet (600 mg total) by mouth 3 (three) times daily.   ibuprofen 800 MG tablet Commonly known as:  ADVIL,MOTRIN Take 800 mg every 8 (eight) hours as needed by mouth for mild pain or moderate pain.   irbesartan 300 MG tablet Commonly known as:  AVAPRO Take 1 tablet (300 mg total) by mouth daily.   levothyroxine 25 MCG tablet Commonly known as:  SYNTHROID, LEVOTHROID TAKE 1 TABLET(25 MCG) BY MOUTH DAILY BEFORE BREAKFAST   MAGNESIUM PO Take 250 mg by mouth 2 (two) times daily.   NOVOLOG FLEXPEN 100 UNIT/ML FlexPen Generic drug:  insulin aspart INJECT 6 UNITS UNDER THE SKIN BEFORE BREAKFAST, 10 UNITS BEFORE LUNCH, AND 9 UNITS BEFORE SUPPER   potassium chloride 10 MEQ tablet Commonly known as:  K-DUR,KLOR-CON TAKE 2 TABLETS(20 MEQ) BY MOUTH TWICE DAILY   pregabalin 50 MG capsule Commonly known as:  LYRICA Take 50 mg by mouth as needed.   PROAIR RESPICLICK 1742(90 Base) MCG/ACT Aepb Generic drug:  Albuterol Sulfate Inhale 2 puffs into the lungs 3 (three) times daily as needed (shortness of breath).   pyridoxine 100 MG tablet Commonly known as:  B-6 Take 100 mg daily by mouth.   SYMBICORT 80-4.5 MCG/ACT inhaler Generic drug:  budesonide-formoterol Inhale 2 puffs into the lungs 2 (two) times daily as needed (shortness of breath).   TRESIBA FLEXTOUCH 100 UNIT/ML Sopn FlexTouch Pen Generic drug:  insulin degludec INJECT 24 UNITS UNDER THE SKIN ONCE DAILY   vitamin B-12 100 MCG tablet Commonly known as:  CYANOCOBALAMIN Take 100 mcg daily by mouth.   vitamin E 400 UNIT capsule Take 400 Units by mouth daily.       Allergies:  Allergies  Allergen Reactions  . Cymbalta [Duloxetine Hcl] Diarrhea and Other  (See Comments)    Dizziness, headache, irritability  . Baclofen     jerks   . Betadine [Povidone Iodine] Swelling and Other (See Comments)    SWELLING REACTION DESCRIPTION/SEVERITY UNSPECIFIED  Reaction to betadine eye drops  . Adhesive [Tape] Rash  . Lipitor [Atorvastatin] Rash    Past Medical History:  Diagnosis Date  . Acute blood loss anemia   . Acute encephalopathy 07/15/2016  . AKI ([place] kidney injury) ([place]   . Anemia    has sickle cell trait  . Anxiety   . Arthritis   . Asthma    has used inhaler in past for asthmatic bronchitis, last time- early 2012  . Bilateral primary osteoarthritis of knee 03/26/2016  . Complication of anesthesia    wakes up shaking  . Diabetes mellitus   . Diabetes mellitus [place] neuropathy ([place] 10/29/2012  . Diverticulitis   . Dyslipidemia   . Encephalopathy 06/2016   due to medications after surgery  . Fibromyalgia   .  GERD (gastroesophageal reflux disease)    occas. use of  Prilosec  . Heart murmur    sees Dr. Montez Morita, last seen- early 2012  . Herniated nucleus pulposus, L2-3 06/25/2016  . History of back surgery   . Hypertension    02/2010- stress test /w PCP  . Hypothyroidism   . Loose bowel movements 12/2016  . Lumbar radiculopathy 06/27/2016  . Lumbar stenosis with neurogenic claudication 03/17/2017  . Memory disorder 02/16/2016  . Myoclonic jerking   . Neuromuscular disorder (HCC)    lumbar radiculopathy, lumbago  . Nocturnal leg cramps 09/27/2014  . Osteoporosis 03/10/2014  . Pneumonia   . Sickle cell trait ([place])   . Sleep apnea    borderline sleep apnea, states she no longer uses, early 2012- stopped using   . Spinal stenosis of lumbar region with radiculopathy 02/28/2014  . Spondylolisthesis of lumbar region 03/19/2011  . Type II or unspecified type diabetes mellitus without mention of complication, uncontrolled 07/08/2013    Past Surgical History:  Procedure Laterality Date  . ABDOMINAL HYSTERECTOMY    . adb.cyst      ovarian cyst  . BACK SURGERY     2012, 2015 (3 total)  . BACK SURGERY  2018   02/24/2017  . COLONOSCOPY    . EYE SURGERY     macular degeneration treatment - injections  . OVARIAN CYST SURGERY    . POSTERIOR LUMBAR FUSION 4 LEVEL N/A 03/17/2017   Procedure: Decompression of Lumbar One-Two with Thoracic Ten to Lumbar Two Fusion;  Surgeon: Kristeen Miss, MD;  Location: [facility];  Service: Neurosurgery;  Laterality: N/A;  Decompression of L1-2 with T10 to L2 Fusion    Family History  Problem Relation Age of Onset  . Ovarian cancer Mother   . Cancer - Prostate Father   . Breast cancer Paternal Aunt   . Multiple myeloma Paternal Aunt   . Anesthesia problems Neg Hx   . Hypotension Neg Hx   . Malignant hyperthermia Neg Hx   . Pseudochol deficiency Neg Hx     Social History:  reports that she quit smoking about 18 years ago. She has never used smokeless tobacco. She reports that she drinks alcohol. She reports that she does not use drugs.  ROS    NEUROPATHY: She has had tingling in feet and legs especially at night. She is taking gabapentin without consistent relief at night At times she will take Lyrica with somewhat better relief at night This was also prescribed as needed for fibromyalgia by her rheumatologist  MUSCLE cramps: She is having less problems now with getting Gatorade and electrolyte solutions Her PCP told her to stop Zetia On Mg   Hyperlipidemia:   The lipid abnormality consists of elevated LDL , high triglyceride and did not tolerate Crestor or lovastatin She thinks they make her cramps worse Diet is usually low in fat meats  She is off Zetia, previously did  seem to have  muscle cramps according to the patient but she tends to have muscle cramps anyway    Lab Results  Component Value Date   CHOL 236 (H) 01/22/2018   HDL 60.10 01/22/2018   LDLCALC 164 (H) 01/22/2018   LDLDIRECT 115.0 12/18/2016   TRIG 62.0 01/22/2018   CHOLHDL 4 01/22/2018       HYPERTENSION:  Has been present for several years.  This is  controlled with taking Avalide from her PCP  Does not monitor at home   Lab Results  Component  Value Date   CREATININE 0.98 01/22/2018   BUN 15 01/22/2018   NA 141 01/22/2018   K 3.7 01/22/2018   CL 104 01/22/2018   CO2 32 01/22/2018       Hypothyroidism  She had a relatively high TSH as of 4/17 and was empirically given Synthroid 25 g Not clear if her fatigue had improved with this but she was wanting to continue supplement  TSH is as follows   Lab Results  Component Value Date   TSH 2.21 08/14/2017   TSH 3.41 12/18/2016   TSH 2.14 08/09/2016   FREET4 0.78 02/22/2016         Examination:   BP 110/70   Pulse 83   Temp 98.1 F (36.7 C) (Oral)   Ht '5\' 5"'  (1.651 m)   Wt 180 lb 6.4 oz (81.8 kg)   SpO2 92%   BMI 30.02 kg/m   Body mass index is 30.02 kg/m.   No pedal edema present  Assesment/Plan:   1. DIABETES type 2 with BMI of 28 See history of present illness for detailed discussion of management, blood sugar patterns and problems identified  Her blood sugars are generally fairly well controlled with A1c 6.4  However he has not checked her blood sugars much and most of her recent blood sugars are high and not able to explain her relatively low A1c She thinks that she is not focusing on her diet and likely home insulin doses of mealtime as well as eating unbalanced meals with more carbohydrate causing recent inconsistent control Also not exercising and still complaining of gaining weight even though she is familiar with diet and need to get back into her exercise regimen  For now we will continue her regimen unchanged but discussed that her Tyler Aas will need to be adjusted based on fasting readings and her NovoLog based on patterns after meals which will require more monitoring She will also try to improve her diet as discussed and avoid high carbohydrate meals and snacks  3. Hypertension:  Blood pressure is controlled with taking Avalide   4.   Hyperlipidemia:  Needs to retry her Zetia, LDL had improved significantly with this and she will see if she has any worsening of muscle cramps with this. She agrees to a trial of WelChol and discussed how this works, formulation available and side effects as well as benefits, dosage regimen She prefers the powder instead of potentially taking 6 large tablets daily but she also is concerned about the cost if it is not generic Prescription will be sent  5.  Muscle cramps: These are idiopathic and she will continue to work with her PCP and rheumatologist, apparently is needing physical therapy also She will discuss potassium supplements with PCP    6.  Mild hypothyroidism: Check TSH on the next visit   Patient Instructions  Check blood sugars on waking up days a week  Also check blood sugars about 2 hours after meals and do this after different meals by rotation  Recommended blood sugar levels on waking up are 90-130 and about 2 hours after meal is 130-160  Please bring your blood sugar monitor to each visit, thank you  Welchol powder   Counseling time on subjects discussed in assessment and plan sections is over 50% of today's 25 minute visit  Influenza vaccine given  [name] 01/26/2018, 8:43 PM

## 2018-01-26 ENCOUNTER — Encounter: Payer: Self-pay | Admitting: Endocrinology

## 2018-01-26 ENCOUNTER — Ambulatory Visit (INDEPENDENT_AMBULATORY_CARE_PROVIDER_SITE_OTHER): Payer: Medicare Other | Admitting: Endocrinology

## 2018-01-26 VITALS — BP 110/70 | HR 83 | Temp 98.1°F | Ht 65.0 in | Wt 180.4 lb

## 2018-01-26 DIAGNOSIS — R252 Cramp and spasm: Secondary | ICD-10-CM

## 2018-01-26 DIAGNOSIS — E785 Hyperlipidemia, unspecified: Secondary | ICD-10-CM | POA: Diagnosis not present

## 2018-01-26 DIAGNOSIS — E1165 Type 2 diabetes mellitus with hyperglycemia: Secondary | ICD-10-CM | POA: Diagnosis not present

## 2018-01-26 DIAGNOSIS — Z794 Long term (current) use of insulin: Secondary | ICD-10-CM

## 2018-01-26 DIAGNOSIS — Z23 Encounter for immunization: Secondary | ICD-10-CM

## 2018-01-26 DIAGNOSIS — E063 Autoimmune thyroiditis: Secondary | ICD-10-CM

## 2018-01-26 MED ORDER — COLESEVELAM HCL 3.75 G PO PACK: PACK | ORAL | 2 refills | Status: DC

## 2018-01-26 NOTE — Patient Instructions (Addendum)
Check blood sugars on waking up days a week  Also check blood sugars about 2 hours after meals and do this after different meals by rotation  Recommended blood sugar levels on waking up are 90-130 and about 2 hours after meal is 130-160  Please bring your blood sugar monitor to each visit, thank you  Welchol powder

## 2018-02-17 DIAGNOSIS — M810 Age-related osteoporosis without current pathological fracture: Secondary | ICD-10-CM | POA: Diagnosis not present

## 2018-02-17 DIAGNOSIS — J45909 Unspecified asthma, uncomplicated: Secondary | ICD-10-CM | POA: Diagnosis not present

## 2018-02-17 DIAGNOSIS — D573 Sickle-cell trait: Secondary | ICD-10-CM | POA: Diagnosis not present

## 2018-02-17 DIAGNOSIS — R32 Unspecified urinary incontinence: Secondary | ICD-10-CM | POA: Diagnosis not present

## 2018-02-17 DIAGNOSIS — I10 Essential (primary) hypertension: Secondary | ICD-10-CM | POA: Diagnosis not present

## 2018-02-17 DIAGNOSIS — E039 Hypothyroidism, unspecified: Secondary | ICD-10-CM | POA: Diagnosis not present

## 2018-02-17 DIAGNOSIS — Z0000 Encounter for general adult medical examination without abnormal findings: Secondary | ICD-10-CM | POA: Diagnosis not present

## 2018-02-17 DIAGNOSIS — E1142 Type 2 diabetes mellitus with diabetic polyneuropathy: Secondary | ICD-10-CM | POA: Diagnosis not present

## 2018-02-17 DIAGNOSIS — J309 Allergic rhinitis, unspecified: Secondary | ICD-10-CM | POA: Diagnosis not present

## 2018-02-17 DIAGNOSIS — E7800 Pure hypercholesterolemia, unspecified: Secondary | ICD-10-CM | POA: Diagnosis not present

## 2018-02-17 DIAGNOSIS — Z1389 Encounter for screening for other disorder: Secondary | ICD-10-CM | POA: Diagnosis not present

## 2018-03-23 ENCOUNTER — Other Ambulatory Visit: Payer: Self-pay | Admitting: Endocrinology

## 2018-04-03 ENCOUNTER — Other Ambulatory Visit: Payer: Self-pay | Admitting: Endocrinology

## 2018-04-14 IMAGING — RF DG C-ARM 61-120 MIN
1 series · 2 of 2 positions shown · non-contrast
Comparison: 02/28/2014

CLINICAL DATA: L2-3 fusion

EXAM:
LUMBAR SPINE - 2-3 VIEW; DG C-ARM 61-120 MIN

[Series 1: run · 2 of 2 slices shown]
[im 1/2]
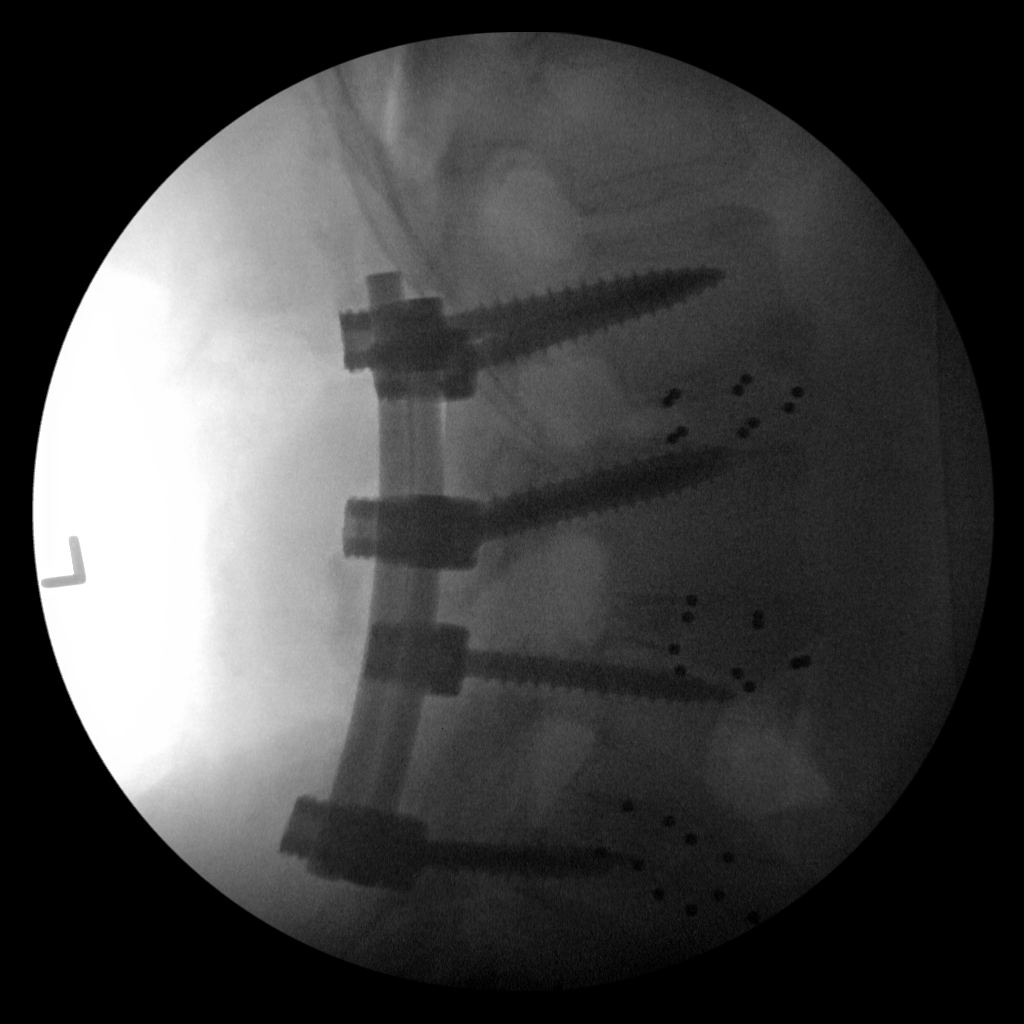
[im 2/2]
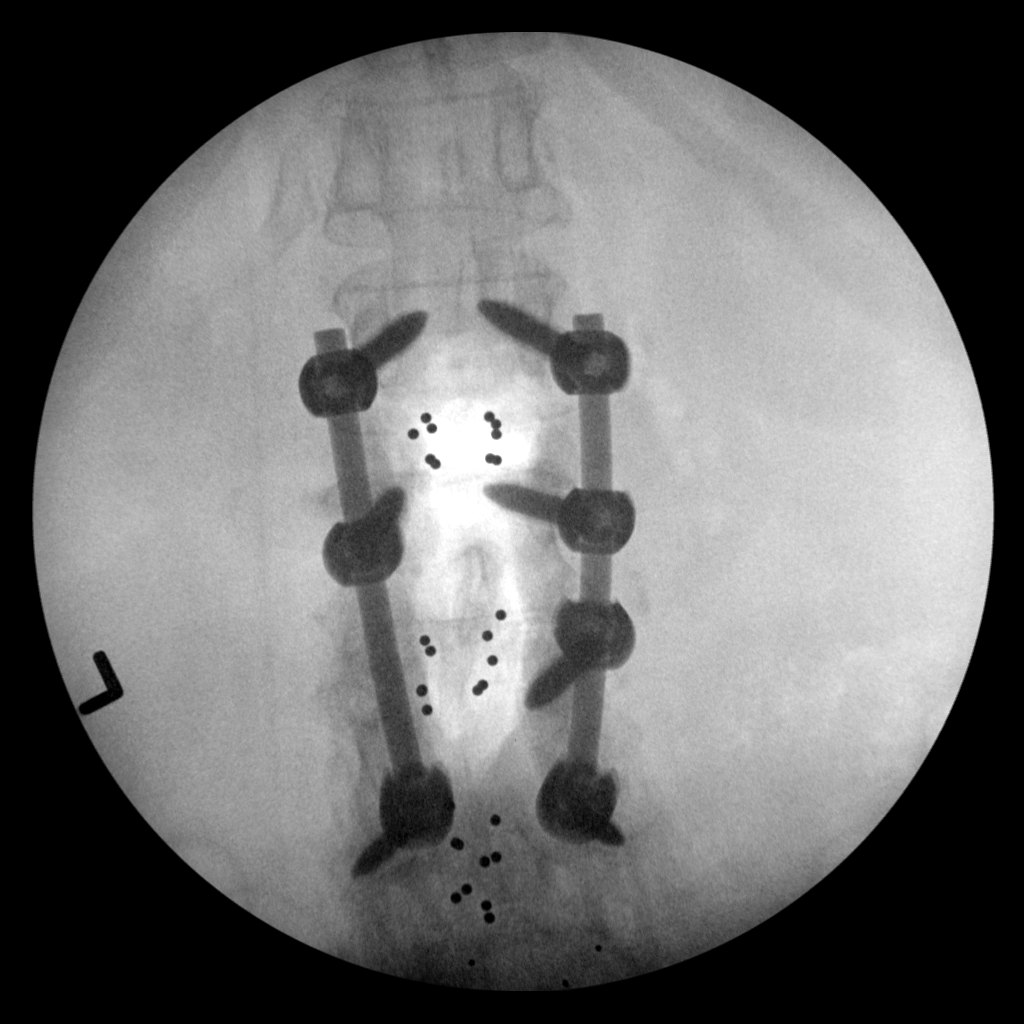

[2 of 2 positions shown; findings below may reference images not displayed]

FINDINGS: AP and lateral C-arm images were obtained of the lumbar spine in the
operating room.

Pre-existing pedicle screw and interbody fusion at L3-4 L4-5.
Interbody spacer L5-S1 not well seen due to patient positioning.

Fusion has been extended to the L2 vertebral body with bilateral
pedicle screws in good position. Interbody spacer L2-3 in good
position.
IMPRESSION: Lumbar fusion L2 through L5

## 2018-04-16 DIAGNOSIS — J309 Allergic rhinitis, unspecified: Secondary | ICD-10-CM | POA: Diagnosis not present

## 2018-04-16 DIAGNOSIS — M1990 Unspecified osteoarthritis, unspecified site: Secondary | ICD-10-CM | POA: Diagnosis not present

## 2018-04-16 DIAGNOSIS — M79604 Pain in right leg: Secondary | ICD-10-CM | POA: Diagnosis not present

## 2018-04-23 ENCOUNTER — Other Ambulatory Visit (INDEPENDENT_AMBULATORY_CARE_PROVIDER_SITE_OTHER): Payer: Medicare Other

## 2018-04-23 DIAGNOSIS — E063 Autoimmune thyroiditis: Secondary | ICD-10-CM

## 2018-04-23 DIAGNOSIS — Z794 Long term (current) use of insulin: Secondary | ICD-10-CM | POA: Diagnosis not present

## 2018-04-23 DIAGNOSIS — E1165 Type 2 diabetes mellitus with hyperglycemia: Secondary | ICD-10-CM

## 2018-04-23 LAB — TSH: TSH: 5.38 u[IU]/mL — AB (ref 0.35–4.50)

## 2018-04-23 LAB — COMPREHENSIVE METABOLIC PANEL
ALT: 12 U/L (ref 0–35)
AST: 18 U/L (ref 0–37)
Albumin: 4.1 g/dL (ref 3.5–5.2)
Alkaline Phosphatase: 75 U/L (ref 39–117)
BUN: 18 mg/dL (ref 6–23)
CO2: 31 mEq/L (ref 19–32)
Calcium: 9.5 mg/dL (ref 8.4–10.5)
Chloride: 103 mEq/L (ref 96–112)
Creatinine, Ser: 1.06 mg/dL (ref 0.40–1.20)
GFR: 64.42 mL/min (ref >60.00)
Glucose, Bld: 88 mg/dL (ref 70–99)
Potassium: 3.7 mEq/L (ref 3.5–5.1)
SODIUM: 142 meq/L (ref 135–145)
Total Bilirubin: 0.4 mg/dL (ref 0.2–1.2)
Total Protein: 6.8 g/dL (ref 6.0–8.3)

## 2018-04-23 LAB — LIPID PANEL
Cholesterol: 281 mg/dL — ABNORMAL HIGH (ref 0–200)
HDL: 56.9 mg/dL (ref >39.00)
LDL Cholesterol: 190 mg/dL — ABNORMAL HIGH (ref 0–99)
NONHDL: 223.84
Total CHOL/HDL Ratio: 5
Triglycerides: 168 mg/dL — ABNORMAL HIGH (ref 0.0–149.0)
VLDL: 33.6 mg/dL (ref 0.0–40.0)

## 2018-04-23 LAB — HEMOGLOBIN A1C: Hgb A1c MFr Bld: 7.3 % — ABNORMAL HIGH (ref 4.6–6.5)

## 2018-04-29 NOTE — Progress Notes (Signed)
Patient ID: [name], female   DOB: 1940/11/08, 78 y.o.   MRN: [id number]    Reason for Appointment:    follow-up  History of Present Illness    PROBLEM 1: Type 2 diabetes mellitus, date of diagnosis: 1998.   Prior history: She had previously been treated with Byetta, Glumetza, Amaryl, Victoza and  Onglyza However because of inadequate control and intolerance to drugs she was finally given pre-meal insulin along with Amaryl, Glumetza eventually stopped because of side effects and also Lantus added  Recent history:  The insulin regimen is: Novolog 6-8 at breakfast and 10 lunch,  acs Tresiba 24 at lunch  Oral agents: None  Her A1c has been mostly between about 6.2 -7.4, now higher than before at 7.3  Current blood sugar patterns and problems with management:  She has checked her sugars erratically  She is doing some readings in the morning but randomly doing some readings in the late evening or overnight  Blood sugars are very inconsistent with a range from 77 up to 339  She again has difficulty being consistent with her diet  She is concerned about her weight gain but she thinks she is not eating a lot of sweets, most of her snacks consist of potato chips  Sometimes will have unbalanced meals like instant oatmeal at breakfast  Does not do much formal exercise except some physical therapy and does not like to do water aerobics during winter months  She is concerned about taking brand-name insulin and the cost  Although she is taking her insulin with NovoLog at mealtimes she may sometimes take it after eating including today  Also not always able to predict how much insulin she needs with readings that can go up significantly high after dinner  Had previously tried Victoza which caused nausea  Monitors blood glucose: 0--2 times a day, readings as below Glucometer: One Touch.     PRE-MEAL Fasting Lunch Dinner Bedtime Overall  Glucose range:  104-163     198, 339   Mean/median:  135    155   POST-MEAL PC Breakfast PC Lunch PC Dinner  Glucose range:  118, 193   93, 292  Mean/median:      Previous average 120   Meals: 2-3 meals per day at 10 am. 2 pm and 6-7 pm but inconsistent schedule.  Breakfast may be [place] sausage and egg with toast, occasionally oatmeal or cereal   Physical activity: exercise:  Off Water aerobics, previously doing 3/7 days a week  Certified Diabetes Educator visit: Most recent: 7/13.  Dietician visit: Most recent: 3/13.   Wt Readings from Last 3 Encounters:  04/30/18 186 lb 3.2 oz (84.5 kg)  01/26/18 180 lb 6.4 oz (81.8 kg)  01/15/18 183 lb (83 kg)   Complications: Neuropathy  LABS:  Lab Results  Component Value Date   HGBA1C 7.3 (H) 04/23/2018   HGBA1C 6.4 01/22/2018   HGBA1C 6.3 08/14/2017   Lab Results  Component Value Date   MICROALBUR <0.7 01/13/2017   [place] 190 (H) 04/23/2018   CREATININE 1.06 04/23/2018    Multiple other issues are addressed in review of systems  No visits with results within 1 Week(s) from this visit.  Latest known visit with results is:  Lab on 04/23/2018  Component Date Value Ref Range Status  . TSH 04/23/2018 5.38* 0.35 - 4.50 uIU/mL Final  . Cholesterol 04/23/2018 281* 0 - 200 mg/dL Final   ATP III Classification  Desirable:  < 200 mg/dL               Borderline High:  200 - 239 mg/dL          High:  > = 240 mg/dL  . Triglycerides 04/23/2018 168.0* 0.0 - 149.0 mg/dL Final   Normal:  <150 mg/dLBorderline High:  150 - 199 mg/dL  . HDL 04/23/2018 56.90  >39.00 mg/dL Final  . VLDL 04/23/2018 33.6  0.0 - 40.0 mg/dL Final  . LDL Cholesterol 04/23/2018 190* 0 - 99 mg/dL Final  . Total CHOL/HDL Ratio 04/23/2018 5   Final                  Men          Women1/2 Average Risk     3.4          3.3Average Risk          5.0          4.42X Average Risk          9.6          7.13X Average Risk          15.0          11.0                      . NonHDL 04/23/2018 223.84    Final   NOTE:  Non-HDL goal should be 30 mg/dL higher than patient's LDL goal (i.e. LDL goal of < 70 mg/dL, would have non-HDL goal of < 100 mg/dL)  . Hgb A1c MFr Bld 04/23/2018 7.3* 4.6 - 6.5 % Final   Glycemic Control Guidelines for People with Diabetes:Non Diabetic:  <6%Goal of Therapy: <7%Additional Action Suggested:  >8%   . Sodium 04/23/2018 142  135 - 145 mEq/L Final  . Potassium 04/23/2018 3.7  3.5 - 5.1 mEq/L Final  . Chloride 04/23/2018 103  96 - 112 mEq/L Final  . CO2 04/23/2018 31  19 - 32 mEq/L Final  . Glucose, Bld 04/23/2018 88  70 - 99 mg/dL Final  . BUN 04/23/2018 18  6 - 23 mg/dL Final  . Creatinine, Ser 04/23/2018 1.06  0.40 - 1.20 mg/dL Final  . Total Bilirubin 04/23/2018 0.4  0.2 - 1.2 mg/dL Final  . Alkaline Phosphatase 04/23/2018 75  39 - 117 U/L Final  . AST 04/23/2018 18  0 - 37 U/L Final  . ALT 04/23/2018 12  0 - 35 U/L Final  . Total Protein 04/23/2018 6.8  6.0 - 8.3 g/dL Final  . Albumin 04/23/2018 4.1  3.5 - 5.2 g/dL Final  . Calcium 04/23/2018 9.5  8.4 - 10.5 mg/dL Final  . GFR 04/23/2018 64.42  >60.00 mL/min Final      Allergies as of 04/30/2018      Reactions   Cymbalta [duloxetine Hcl] Diarrhea, Other (See Comments)   Dizziness, headache, irritability   Baclofen    jerks    Betadine [povidone Iodine] Swelling, Other (See Comments)   SWELLING REACTION DESCRIPTION/SEVERITY UNSPECIFIED  Reaction to betadine eye drops   Adhesive [tape] Rash   Lipitor [atorvastatin] Rash      Medication List       Accurate as of April 30, 2018  1:53 PM. Always use your most recent med list.        acetaminophen 500 MG tablet Commonly known as:  TYLENOL Take 1,000 mg by mouth every 8 (eight) hours as needed for mild  pain or moderate pain.   aspirin 81 MG EC tablet Take 69m by mouth once daily   BD PEN NEEDLE NANO U/F 32G X 4 MM Misc Generic drug:  Insulin Pen Needle USE AS DIRECTED   cetirizine 10 MG tablet Commonly known as:  ZYRTEC Take 10 mg by  mouth daily.   Colesevelam HCl 3.75 g Pack Dissolve 1 packet in unsweetened beverage and take daily with lunch   fluticasone 50 MCG/ACT nasal spray Commonly known as:  FLONASE Place 1 spray into both nostrils daily.   gabapentin 600 MG tablet Commonly known as:  NEURONTIN Take 1 tablet (600 mg total) by mouth 3 (three) times daily.   ibuprofen 800 MG tablet Commonly known as:  ADVIL,MOTRIN Take 800 mg every 8 (eight) hours as needed by mouth for mild pain or moderate pain.   irbesartan 300 MG tablet Commonly known as:  AVAPRO Take 1 tablet (300 mg total) by mouth daily.   levothyroxine 25 MCG tablet Commonly known as:  SYNTHROID, LEVOTHROID TAKE 1 TABLET(25 MCG) BY MOUTH DAILY BEFORE BREAKFAST   MAGNESIUM PO Take 250 mg by mouth 2 (two) times daily.   NOVOLOG FLEXPEN 100 UNIT/ML FlexPen Generic drug:  insulin aspart INJECT 6 UNITS UNDER THE SKIN BEFORE BREAKFAST, 10 UNITS BEFORE LUNCH, AND 9 UNITS BEFORE SUPPER   pregabalin 50 MG capsule Commonly known as:  LYRICA Take 50 mg by mouth as needed.   PROAIR RESPICLICK 1378(90 Base) MCG/ACT Aepb Generic drug:  Albuterol Sulfate Inhale 2 puffs into the lungs 3 (three) times daily as needed (shortness of breath).   pyridoxine 100 MG tablet Commonly known as:  B-6 Take 100 mg daily by mouth.   SYMBICORT 80-4.5 MCG/ACT inhaler Generic drug:  budesonide-formoterol Inhale 2 puffs into the lungs 2 (two) times daily as needed (shortness of breath).   TRESIBA FLEXTOUCH 100 UNIT/ML Sopn FlexTouch Pen Generic drug:  insulin degludec INJECT 24 UNITS UNDER THE SKIN ONCE DAILY   vitamin B-12 100 MCG tablet Commonly known as:  CYANOCOBALAMIN Take 100 mcg daily by mouth.   vitamin E 400 UNIT capsule Take 400 Units by mouth daily.       Allergies:  Allergies  Allergen Reactions  . Cymbalta [Duloxetine Hcl] Diarrhea and Other (See Comments)    Dizziness, headache, irritability  . Baclofen     jerks   . Betadine [Povidone  Iodine] Swelling and Other (See Comments)    SWELLING REACTION DESCRIPTION/SEVERITY UNSPECIFIED  Reaction to betadine eye drops  . Adhesive [Tape] Rash  . Lipitor [Atorvastatin] Rash    Past Medical History:  Diagnosis Date  . Acute blood loss anemia   . Acute encephalopathy 07/15/2016  . [place] ([place] kidney injury) ([facility]   . Anemia    has sickle cell trait  . Anxiety   . Arthritis   . Asthma    has used inhaler in past for asthmatic bronchitis, last time- early 2012  . Bilateral primary osteoarthritis of knee 03/26/2016  . Complication of anesthesia    wakes up shaking  . Diabetes mellitus   . Diabetes mellitus with neuropathy ([facility] 10/29/2012  . Diverticulitis   . Dyslipidemia   . Encephalopathy 06/2016   due to medications after surgery  . Fibromyalgia   . GERD (gastroesophageal reflux disease)    occas. use of  Prilosec  . Heart murmur    sees Dr. SMontez Morita last seen- early 2012  . Herniated nucleus pulposus, L2-3 06/25/2016  . History of back  surgery   . Hypertension    02/2010- stress test /w PCP  . Hypothyroidism   . Loose bowel movements 12/2016  . Lumbar radiculopathy 06/27/2016  . Lumbar stenosis with neurogenic claudication 03/17/2017  . Memory disorder 02/16/2016  . Myoclonic jerking   . Neuromuscular disorder ([facility])    lumbar radiculopathy, lumbago  . Nocturnal leg cramps 09/27/2014  . Osteoporosis 03/10/2014  . Pneumonia   . Sickle cell trait ([place])   . Sleep apnea    borderline sleep apnea, states she no longer uses, early 2012- stopped using   . Spinal stenosis of lumbar region with radiculopathy 02/28/2014  . Spondylolisthesis of lumbar region 03/19/2011  . Type II or unspecified type diabetes mellitus without mention of complication, uncontrolled 07/08/2013    Past Surgical History:  Procedure Laterality Date  . ABDOMINAL HYSTERECTOMY    . adb.cyst     ovarian cyst  . BACK SURGERY     2012, 2015 (3 total)  . BACK SURGERY  2018   02/24/2017  .  COLONOSCOPY    . EYE SURGERY     macular degeneration treatment - injections  . OVARIAN CYST SURGERY    . POSTERIOR LUMBAR FUSION 4 LEVEL N/A 03/17/2017   Procedure: Decompression of Lumbar One-Two with Thoracic Ten to Lumbar Two Fusion;  Surgeon: Kristeen Miss, MD;  Location: [facility];  Service: Neurosurgery;  Laterality: N/A;  Decompression of L1-2 with T10 to L2 Fusion    Family History  Problem Relation Age of Onset  . Ovarian cancer Mother   . Cancer - Prostate Father   . Breast cancer Paternal Aunt   . Multiple myeloma Paternal Aunt   . Anesthesia problems Neg Hx   . Hypotension Neg Hx   . Malignant hyperthermia Neg Hx   . Pseudochol deficiency Neg Hx     Social History:  reports that she quit smoking about 19 years ago. She has never used smokeless tobacco. She reports current alcohol use. She reports that she does not use drugs.  ROS    NEUROPATHY: She has had tingling in feet and legs especially at night. She is taking gabapentin without consistent relief at night At times she will take Lyrica with somewhat better relief at night This was also prescribed as needed for fibromyalgia by her rheumatologist  MUSCLE cramps: She is having less problems now with getting Gatorade and electrolyte solutions Her PCP told her to stop Zetia On Mg   Hyperlipidemia:   The lipid abnormality consists of elevated LDL , high triglyceride and did not tolerate Crestor or lovastatin She thinks they make her cramps worse  She is off Zetia, previously did  seem to have  muscle cramps  Her lipids are appearing worse and her diet has not been consistent over the last couple of months  Lab Results  Component Value Date   CHOL 281 (H) 04/23/2018   HDL 56.90 04/23/2018   LDLCALC 190 (H) 04/23/2018   LDLDIRECT 115.0 12/18/2016   TRIG 168.0 (H) 04/23/2018   CHOLHDL 5 04/23/2018      HYPERTENSION:  Has been present for several years.  This is  controlled with taking Avapro from her  PCP  Does not monitor at home   Lab Results  Component Value Date   CREATININE 1.06 04/23/2018   BUN 18 04/23/2018   NA 142 04/23/2018   K 3.7 04/23/2018   CL 103 04/23/2018   CO2 31 04/23/2018       Hypothyroidism  She had a relatively high TSH as of 4/17 and was empirically given Synthroid 25 g Not clear if her fatigue had improved with this However her TSH was high last week She says that she thought the medication was making her gain weight and has not taken it this week  TSH is as follows   Lab Results  Component Value Date   TSH 5.38 (H) 04/23/2018   TSH 2.21 08/14/2017   TSH 3.41 12/18/2016   FREET4 0.78 02/22/2016         Examination:   BP 130/80 (BP Location: Left Arm, Patient Position: Sitting, Cuff Size: Normal)   Pulse 80   Ht '5\' 5"'  (1.651 m)   Wt 186 lb 3.2 oz (84.5 kg)   SpO2 100%   BMI 30.99 kg/m   Body mass index is 30.99 kg/m.   No pedal edema present  Assesment/Plan:   1. DIABETES type 2 with BMI of 31  See history of present illness for detailed discussion of management, blood sugar patterns and problems identified  Her blood sugars are overall somewhat worse with A1c 7.3 compared to 6.4  As before her blood sugar monitoring is very sporadic and blood sugars are variable depending on her compliance with diet She may also sometimes have unbalanced meals Occasionally high sugars are related to taking NovoLog postprandially Not exercising and gaining weight also Generally fairly well controlled with A1c 6.4  She is still complaining about tendency to weight gain, multiple insulin injections and cost of her treatment Since her fasting readings are generally fairly good and only higher when she is snacking during the night will not change her [place] Aas However discussed the benefits of using SGLT2 drugs for added benefit including cardiovascular protection  .Discussed action of SGLT 2 drugs on lowering glucose by decreasing kidney  absorption of glucose, benefits of weight loss and lower blood pressure, possible side effects including candidiasis and dosage regimen  Her insurance appears to be covering only [place] and will give her 5 mg dose to start with Discussed adjustment of insulin downwards at least 2 units with starting [place] and further if needed She will also cut her Avapro in half   3. Hypertension: Blood pressure is controlled with taking Avapro   4.   Hyperlipidemia: Not controlled because of her refusal to take medications She is reporting intolerance to all medications but did not try the WelChol that was prescribed  5.  Chronic pain    6.  Mild hypothyroidism: Not sure if she is symptomatic Her TSH is however trending higher even with her taking 25 mcg supplement Although she should go up to 37.5 mcg she wants to wait till next visit to change the dose Check TSH on the next visit   There are no Patient Instructions on file for this visit.    Counseling time on subjects discussed in assessment and plan sections is over 50% of today's 25 minute visit  [name] 04/30/2018, 1:53 PM

## 2018-04-30 ENCOUNTER — Ambulatory Visit (INDEPENDENT_AMBULATORY_CARE_PROVIDER_SITE_OTHER): Payer: Medicare Other | Admitting: Endocrinology

## 2018-04-30 ENCOUNTER — Encounter: Payer: Self-pay | Admitting: Endocrinology

## 2018-04-30 VITALS — BP 130/80 | HR 80 | Ht 65.0 in | Wt 186.2 lb

## 2018-04-30 DIAGNOSIS — E1165 Type 2 diabetes mellitus with hyperglycemia: Secondary | ICD-10-CM

## 2018-04-30 DIAGNOSIS — E782 Mixed hyperlipidemia: Secondary | ICD-10-CM | POA: Diagnosis not present

## 2018-04-30 DIAGNOSIS — I10 Essential (primary) hypertension: Secondary | ICD-10-CM

## 2018-04-30 DIAGNOSIS — Z794 Long term (current) use of insulin: Secondary | ICD-10-CM | POA: Diagnosis not present

## 2018-04-30 MED ORDER — DAPAGLIFLOZIN PROPANEDIOL 5 MG PO TABS: 5.0000 mg | ORAL_TABLET | Freq: Every day | ORAL | 3 refills | Status: DC

## 2018-04-30 NOTE — Patient Instructions (Addendum)
Take Novolog before meals  More sugars after 2 hours  With [place] reduce Tresiba and Novolog by 2 units and keep sugars as below, also cut Avapro in 1/2   Check blood sugars on waking up 5 days a week  Also check blood sugars about 2 hours after meals and do this after different meals by rotation  Recommended blood sugar levels on waking up are 90-130 and about 2 hours after meal is 130-160  Please bring your blood sugar monitor to each visit, thank you  Take thyroid daily

## 2018-05-02 IMAGING — MR MR THORACIC SPINE WO/W CM
6 of 19 series · 18 of 48 positions shown · IV contrast (multihance)
Comparison: Preoperative MRI 06/18/2016.

CLINICAL DATA: Severe back pain. Recent lumbar fusion on
06/25/2016.

EXAM:
MRI THORACIC AND LUMBAR SPINE WITHOUT AND WITH CONTRAST
TECHNIQUE: Multiplanar and multiecho pulse sequences of the thoracic and lumbar
spine were obtained without and with intravenous contrast.
CONTRAST:  15mL MULTIHANCE GADOBENATE DIMEGLUMINE 529 MG/ML IV SOLN

[Series 3: T2 · sagittal · 4.0mm · 0.55mm/px · 2 of 15 slices shown (1 of 4)]
[im 1/15]
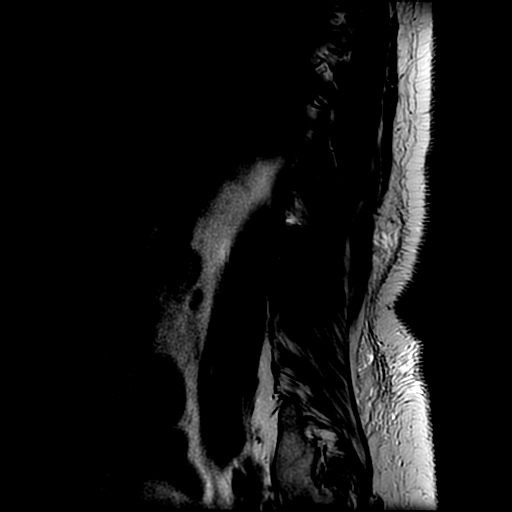
[im 15/15]
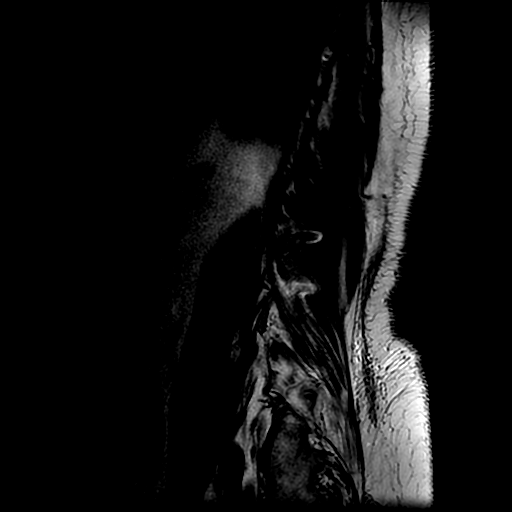

[Series 5: T1 · sagittal · 4.0mm · 0.55mm/px · 2 of 15 slices shown (1 of 2)]
[im 1/15]
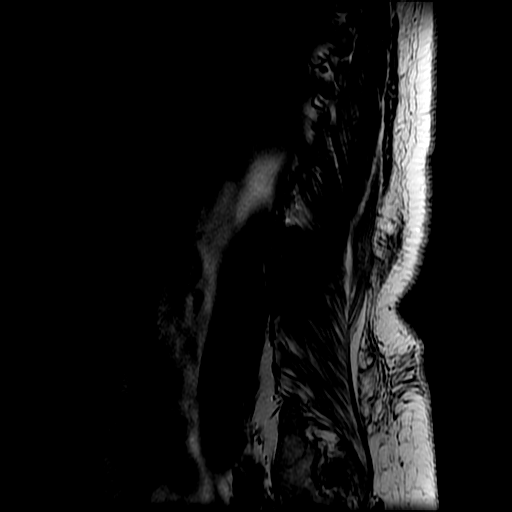
[im 15/15]
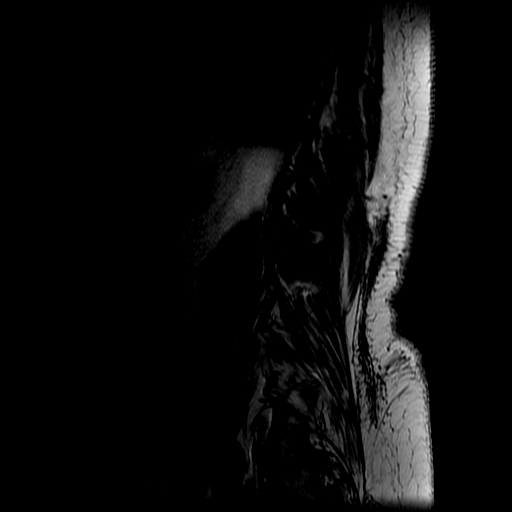

[Series 7: T2 · axial · 4.0mm · 0.39mm/px · z∈[-3,+212]mm · 5 of 44 slices shown (2 of 4)]
[im 1/44]
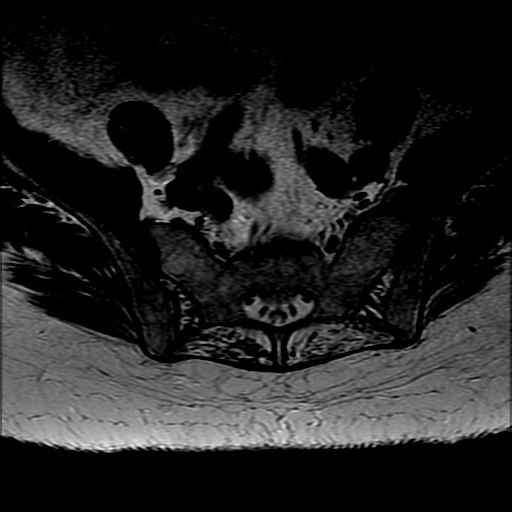
[im 11/44]
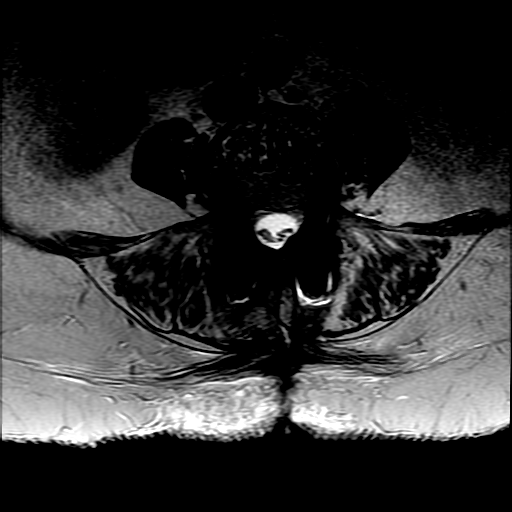
[im 22/44]
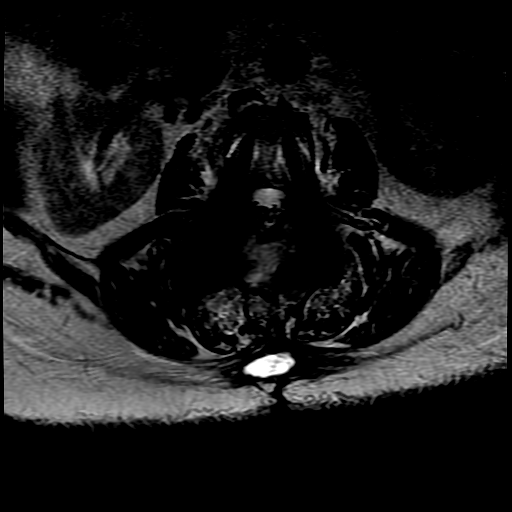
[im 33/44]
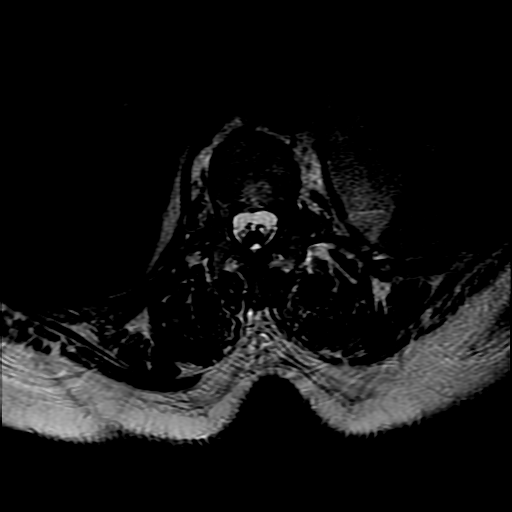
[im 44/44]
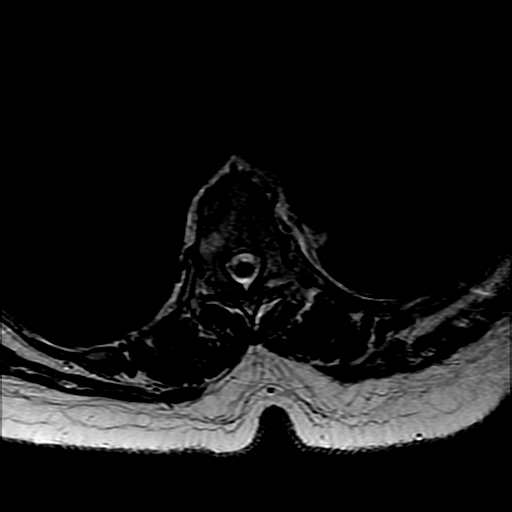

[Series 8: T1 · axial · 4.0mm · 0.39mm/px · z∈[-3,+212]mm · 4 of 44 slices shown (2 of 2)]
[im 1/44]
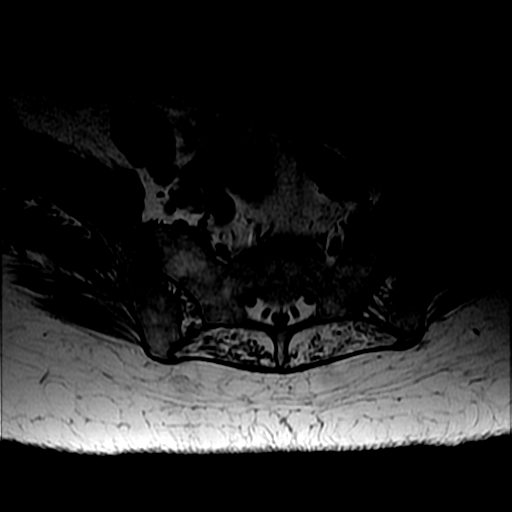
[im 15/44]
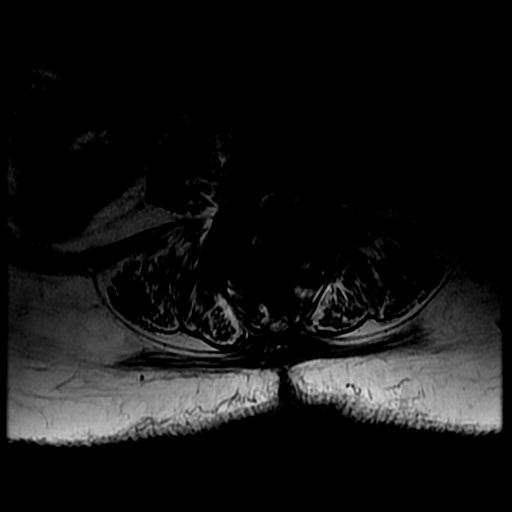
[im 29/44]
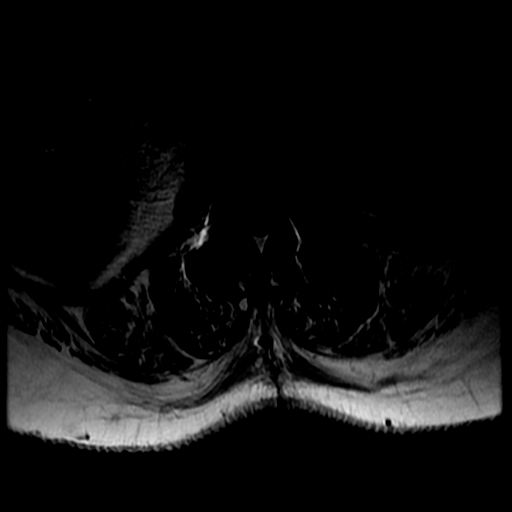
[im 44/44]
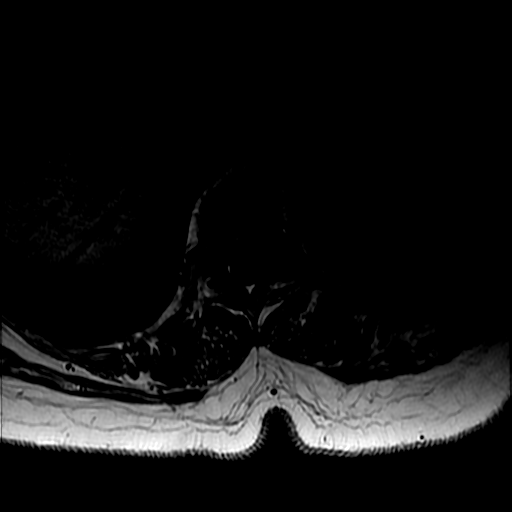

[Series 13: T2 · sagittal · 3.0mm · 0.64mm/px · 1 of 15 slices shown (3 of 4)]
[im 1/15]
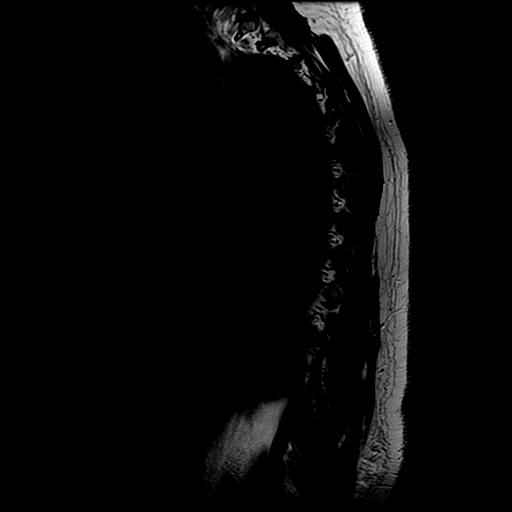

[Series 16: T2 · axial · 4.0mm · 0.43mm/px · z∈[+159,+377]mm · 4 of 42 slices shown (4 of 4)]
[im 1/42]
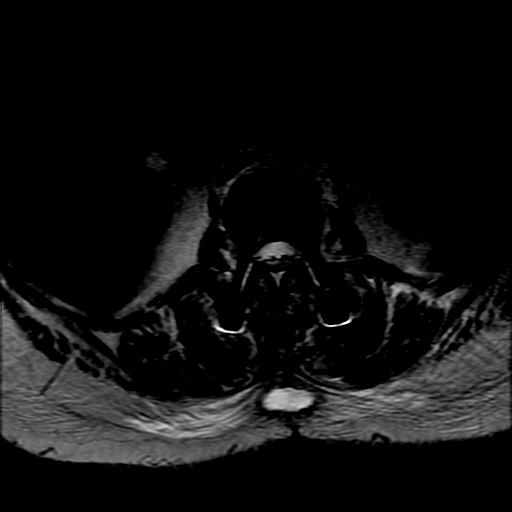
[im 14/42]
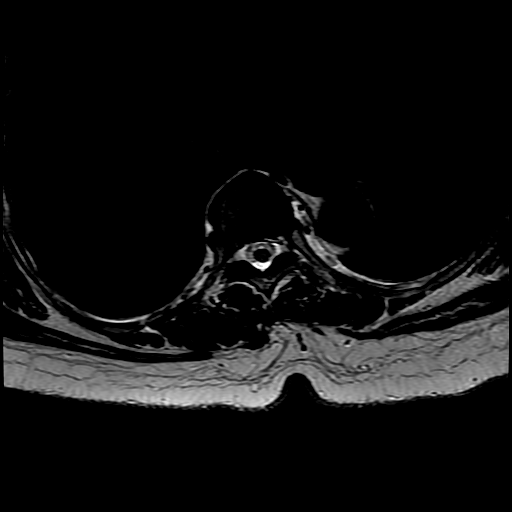
[im 28/42]
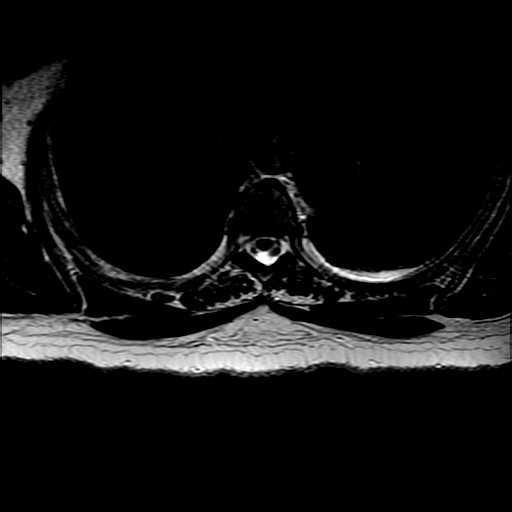
[im 42/42]
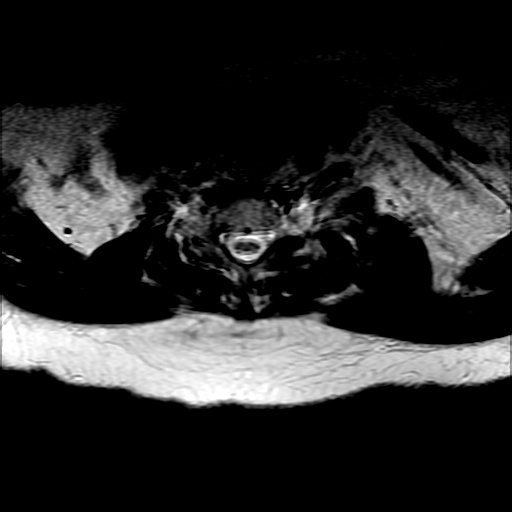

[18 of 48 positions shown; findings below may reference images not displayed]

Intraoperative radiographs
06/25/2016. Postoperative lumbar spine films 07/11/2016.
FINDINGS: MRI THORACIC SPINE FINDINGS

Alignment:  Physiologic.

Vertebrae: No fracture, evidence of discitis, or bone lesion.

Cord:  Normal signal and morphology.

Paraspinal and other soft tissues: Negative.

Disc levels:

Shallow disc protrusions at RIGHT T7-8, centrally at T8-9, and
centrally at T12-L1 do not result in significant spinal stenosis or
cord compression.

MRI LUMBAR SPINE FINDINGS

Segmentation:  Standard

Alignment: Anatomic except for 3 mm anterolisthesis L2 on L3, and 3
mm anterolisthesis L5 on S1.

Vertebrae: There is an acute inferior endplate fracture of L2, with
subsequent subsidence of the cage upward. There is loss of
interspace height at L2-3 and anterior translation. Reference image
9 series 6. Mild postcontrast enhancement of L2.

Conus medullaris: Extends to the L1 level and appears normal.

Paraspinal and other soft tissues: Uncomplicated appearing seroma at
the site of laminectomy.

Disc levels:

L1-L2:  Normal.

L2-L3: Status post posterolateral and interbody fusion. Cage
subsidence due to endplate softening, upward into L2 with
accompanying bone marrow edema. Associated loss of disc space
height. 3 mm anterolisthesis. Enhancement in the ventral epidural
space, up to 5 mm thick, not necessarily pathologic or unexpected
given the large disc extrusion which was present preoperatively.
Mild flattening of the thecal sac without significant spinal
stenosis, see axial image 18 series 7. Presence or absence of L3
nerve root impingement difficult to establish. No features to
strongly suggest postoperative infection.

L3-L4:  Unremarkable post fusion interspace.

L4-L5:  Unremarkable post fusion interspace.

L5-S1: Fixed anterolisthesis status post posterior and interbody
fusion. No impingement.

Correlating the MR findings with plain films from 07/11/2016 [REDACTED],
cage subsidence is confirmed. The appearance of the cage at L2-3 and
the inferior endplate of L2 and is a change from the intraoperative
films of 06/25/2016.
IMPRESSION: Unremarkable thoracic spine MRI without and with contrast.

Cage subsidence L2-3, status post PLIF 06/25/2016. Upward migration
of the interbody cage into the inferior endplate of L2, with
subsequent loss of intervertebral disc height, represents an
interval change from the intraoperative radiographs.

No features strongly suggestive of disc space infection. Mild
enhancement of the ventral epidural space at L2, not necessarily
unexpected given the large disc extrusion present preoperatively.

## 2018-05-07 NOTE — Progress Notes (Signed)
Office Visit Note  Patient: [name]             Date of Birth: 1940/06/22           MRN: [id number]             PCP: Wenda Low, MD Referring: Wenda Low, MD Visit Date: 05/08/2018 Occupation: '@GUAROCC' @  Subjective:  Pain of the Left Hip and Hip Pain (Left hip pain)   History of Present Illness: [name] is a 78 y.o. female with history of fibromyalgia, osteoarthritis, osteoporosis, and DDD. She is taking gabapentin 300 mg  2 tablets in the morning and evening. She complains of left hip and leg pain.  She believes she may have bursitis. She is still going to physical therapy in order to do recommended exercises. She  has discomfort in her neck without radiculopathy.  Activities of Daily Living:  Patient reports morning stiffness for 2 hour.   Patient Denies nocturnal pain.  Difficulty dressing/grooming: Denies Difficulty climbing stairs: Denies Difficulty getting out of chair: Denies Difficulty using hands for taps, buttons, cutlery, and/or writing: Denies  Review of Systems  Constitutional: Positive for fatigue. Negative for night sweats, weight gain and weight loss.  HENT: Positive for mouth dryness. Negative for mouth sores, trouble swallowing, trouble swallowing and nose dryness.   Eyes: Negative for pain, redness, visual disturbance and dryness.  Respiratory: Negative for cough, shortness of breath and difficulty breathing.   Cardiovascular: Negative for chest pain, palpitations, hypertension, irregular heartbeat and swelling in legs/feet.  Gastrointestinal: Negative for blood in stool, constipation and diarrhea.  Endocrine: Positive for increased urination.  Genitourinary: Negative for difficulty urinating and vaginal dryness.  Musculoskeletal: Positive for arthralgias, gait problem, joint pain, myalgias, morning stiffness and myalgias. Negative for joint swelling, muscle weakness and muscle tenderness.  Skin: Negative for color change, rash, hair  loss, skin tightness, ulcers and sensitivity to sunlight.  Allergic/Immunologic: Negative for susceptible to infections.  Neurological: Positive for weakness. Negative for dizziness, numbness, memory loss and night sweats.  Hematological: Negative for bruising/bleeding tendency and swollen glands.  Psychiatric/Behavioral: Positive for sleep disturbance. Negative for depressed mood. The patient is not nervous/anxious.     PMFS History:  Patient Active Problem List   Diagnosis Date Noted  . Acute blood loss as cause of postoperative anemia 03/30/2017  . Diabetic polyneuropathy associated with secondary diabetes mellitus ([place]) 03/30/2017  . Asthma 03/30/2017  . Dyslipidemia 03/26/2017  . Lumbar stenosis with neurogenic claudication 03/17/2017  . Spinal stenosis of lumbar region 03/03/2017  . Myoclonic jerking   . Nausea   . Acute encephalopathy 07/15/2016  . Lumbar radiculopathy 06/27/2016  . Hypothyroidism 06/26/2016  . Labile blood glucose   . Surgery, elective   . Post-operative pain   . Sickle cell trait ([place])   . Acute blood loss anemia   . History of back surgery   . AKI (acute kidney injury) ([place])   . Herniated nucleus pulposus, L2-3 06/25/2016  . Bilateral primary osteoarthritis of knee 03/26/2016  . Osteoarthritis, hand 03/26/2016  . Other insomnia 03/26/2016  . Memory disorder 02/16/2016  . Fibromyalgia 09/27/2014  . Nocturnal leg cramps 09/27/2014  . Body mass index (BMI) of 30.0-30.9 in adult 08/04/2014  . Neuropathy 03/10/2014  . Allergic rhinitis 03/10/2014  . Osteoporosis 03/10/2014  . Spinal stenosis of lumbar region with radiculopathy 02/28/2014  . Type II or unspecified type diabetes mellitus without mention of complication, uncontrolled 07/08/2013  . Hypokalemia 11/02/2012  . Essential  hypertension, benign 11/02/2012  . Diabetes mellitus with neuropathy ([place]) 10/29/2012  . Hyperlipidemia 10/29/2012  . Spondylolisthesis of lumbar region 03/19/2011  .  Lumbar radicular pain 03/19/2011    Past Medical History:  Diagnosis Date  . Acute blood loss anemia   . Acute encephalopathy 07/15/2016  . AKI (acute kidney injury) ([place])   . Anemia    has sickle cell trait  . Anxiety   . Arthritis   . Asthma    has used inhaler in past for asthmatic bronchitis, last time- early 2012  . Bilateral primary osteoarthritis of knee 03/26/2016  . Complication of anesthesia    wakes up shaking  . Diabetes mellitus   . Diabetes mellitus with neuropathy ([place]) 10/29/2012  . Diverticulitis   . Dyslipidemia   . Encephalopathy 06/2016   due to medications after surgery  . Fibromyalgia   . GERD (gastroesophageal reflux disease)    occas. use of  Prilosec  . Heart murmur    sees Dr. Montez Morita, last seen- early 2012  . Herniated nucleus pulposus, L2-3 06/25/2016  . History of back surgery   . Hypertension    02/2010- stress test /w PCP  . Hypothyroidism   . Loose bowel movements 12/2016  . Lumbar radiculopathy 06/27/2016  . Lumbar stenosis with neurogenic claudication 03/17/2017  . Memory disorder 02/16/2016  . Myoclonic jerking   . Neuromuscular disorder ([facility])    lumbar radiculopathy, lumbago  . Nocturnal leg cramps 09/27/2014  . Osteoporosis 03/10/2014  . Pneumonia   . Sickle cell trait ([place])   . Sleep apnea    borderline sleep apnea, states she no longer uses, early 2012- stopped using   . Spinal stenosis of lumbar region with radiculopathy 02/28/2014  . Spondylolisthesis of lumbar region 03/19/2011  . Type II or unspecified type diabetes mellitus without mention of complication, uncontrolled 07/08/2013    Family History  Problem Relation Age of Onset  . Ovarian cancer Mother   . Cancer - Prostate Father   . Breast cancer Paternal Aunt   . Multiple myeloma Paternal Aunt   . Anesthesia problems Neg Hx   . Hypotension Neg Hx   . Malignant hyperthermia Neg Hx   . Pseudochol deficiency Neg Hx    Past Surgical History:  Procedure Laterality Date  .  ABDOMINAL HYSTERECTOMY    . adb.cyst     ovarian cyst  . BACK SURGERY     2012, 2015 (3 total)  . BACK SURGERY  2018   02/24/2017  . COLONOSCOPY    . EYE SURGERY     macular degeneration treatment - injections  . OVARIAN CYST SURGERY    . POSTERIOR LUMBAR FUSION 4 LEVEL N/A 03/17/2017   Procedure: Decompression of Lumbar One-Two with Thoracic Ten to Lumbar Two Fusion;  Surgeon: Kristeen Miss, MD;  Location: [facility];  Service: Neurosurgery;  Laterality: N/A;  Decompression of L1-2 with T10 to L2 Fusion   Social History   Social History Narrative   Patient drinks caffeine occasionally.   Patient is right handed.   Admitted to [facility] 03/21/17   Divorced    Former smoker - stopped 2000    Alcohol - occasionally wine at dinner   Full code   Immunization History  Administered Date(s) Administered  . Influenza, High Dose Seasonal PF 01/26/2018  . Influenza,inj,Quad PF,6+ Mos 02/18/2013, 02/09/2014  . Influenza-Unspecified 01/16/2015  . Pneumococcal Conjugate-13 08/29/2014  . Pneumococcal Polysaccharide-23 04/23/2005     Objective: Vital  Signs: BP 117/69 (BP Location: Left Arm, Patient Position: Sitting, Cuff Size: Normal)   Pulse 73   Resp 18   Ht '5\' 4"'  (1.626 m)   Wt 186 lb (84.4 kg)   BMI 31.93 kg/m    Physical Exam Vitals signs and nursing note reviewed.  Constitutional:      Appearance: She is well-developed.  HENT:     Head: Normocephalic and atraumatic.  Eyes:     Conjunctiva/sclera: Conjunctivae normal.  Neck:     Musculoskeletal: Normal range of motion.  Cardiovascular:     Rate and Rhythm: Normal rate and regular rhythm.     Heart sounds: Normal heart sounds.  Pulmonary:     Effort: Pulmonary effort is normal.     Breath sounds: Normal breath sounds.  Abdominal:     General: Bowel sounds are normal.     Palpations: Abdomen is soft.  Lymphadenopathy:     Cervical: No cervical adenopathy.  Skin:    General: Skin is warm and dry.      Capillary Refill: Capillary refill takes less than 2 seconds.  Neurological:     Mental Status: She is alert and oriented to person, place, and time.  Psychiatric:        Behavior: Behavior normal.      Musculoskeletal Exam: Patient had good range of motion of her cervical spine with some discomfort.  She had limited range of motion of her lumbar spine.  She had tenderness on palpation over left trochanteric bursa and over IT band.  She had crepitus in her right knee joint with some warmth.  She had generalized pain and discomfort and positive tender points.  CDAI Exam: CDAI Score: Not documented Patient Global Assessment: Not documented; Provider Global Assessment: Not documented Swollen: Not documented; Tender: Not documented Joint Exam   Not documented   There is currently no information documented on the homunculus. Go to the Rheumatology activity and complete the homunculus joint exam.  Investigation: No additional findings.  Imaging: No results found.  Recent Labs: Lab Results  Component Value Date   WBC 6.2 03/29/2017   HGB 9.9 (A) 03/29/2017   PLT 235 03/29/2017   NA 142 04/23/2018   K 3.7 04/23/2018   CL 103 04/23/2018   CO2 31 04/23/2018   GLUCOSE 88 04/23/2018   BUN 18 04/23/2018   CREATININE 1.06 04/23/2018   BILITOT 0.4 04/23/2018   ALKPHOS 75 04/23/2018   AST 18 04/23/2018   ALT 12 04/23/2018   PROT 6.8 04/23/2018   ALBUMIN 4.1 04/23/2018   CALCIUM 9.5 04/23/2018   GFRAA >60 03/18/2017    Speciality Comments: No specialty comments available.  Procedures:  Large Joint Inj: L greater trochanter on 05/08/2018 11:51 AM Indications: pain Details: 27 G 1.5 in needle, lateral approach  Arthrogram: No  Medications: 40 mg triamcinolone acetonide 40 MG/ML; 1.5 mL lidocaine 1 % Aspirate: 0 mL Outcome: tolerated well, no immediate complications Procedure, treatment alternatives, risks and benefits explained, specific risks discussed. Consent was given by  the patient. Immediately prior to procedure a time out was called to verify the correct patient, procedure, equipment, support staff and site/side marked as required. Patient was prepped and draped in the usual sterile fashion.     Allergies: Cymbalta [duloxetine hcl]; Baclofen; Betadine [povidone iodine]; Adhesive [tape]; and Lipitor [atorvastatin]   Assessment / Plan:     Visit Diagnoses: Fibromyalgia-she has been experiencing generalized pain and discomfort.  She has positive tender points.  Trochanteric bursitis  of left hip -is been having severe pain and discomfort in her left trochanteric bursa.  IT band exercises were demonstrated and discussed.  Per her request bursa was injected.  Have advised her to monitor blood sugar closely.  Plan: Large Joint Inj: L greater trochanter  Primary osteoarthritis of both hands-joint protection muscle strengthening was discussed.  Primary osteoarthritis of right knee-she has severe end-stage osteoarthritis of the knee joint.  Total knee replacement was discussed.  She states she will have to find a and will ask for referral when needed.  DDD (degenerative disc disease), lumbar - s/p fusion x3  Other insomnia  Age-related osteoporosis without current pathological fracture - Ttd with Fosamax in the past by her PCP.  The patient has been getting DEXA through her PCP.  History of diabetes mellitus-she is been advised to monitor blood sugar closely.  History of neuropathy-she is on gabapentin and takes Lyrica occasionally.  History of hypertension-blood pressure was under good control.  History of sleep apnea  History of hypothyroidism  Dyslipidemia   Orders: Orders Placed This Encounter  Procedures  . Large Joint Inj: L greater trochanter   No orders of the defined types were placed in this encounter.   Face-to-face time spent with patient was 30 minutes. Greater than 50% of time was spent in counseling and coordination of  care.  Follow-Up Instructions: Return in about 6 months (around 11/06/2018) for FMS, DDD OA.   Bo Merino, MD  Note - This record has been created using Editor, commissioning.  Chart creation errors have been sought, but may not always  have been located. Such creation errors do not reflect on  the standard of medical care.

## 2018-05-08 ENCOUNTER — Ambulatory Visit (INDEPENDENT_AMBULATORY_CARE_PROVIDER_SITE_OTHER): Payer: Medicare Other | Admitting: Rheumatology

## 2018-05-08 ENCOUNTER — Encounter: Payer: Self-pay | Admitting: Rheumatology

## 2018-05-08 VITALS — BP 117/69 | HR 73 | Resp 18 | Ht 64.0 in | Wt 186.0 lb

## 2018-05-08 DIAGNOSIS — Z8679 Personal history of other diseases of the circulatory system: Secondary | ICD-10-CM

## 2018-05-08 DIAGNOSIS — M1711 Unilateral primary osteoarthritis, right knee: Secondary | ICD-10-CM

## 2018-05-08 DIAGNOSIS — M19041 Primary osteoarthritis, right hand: Secondary | ICD-10-CM

## 2018-05-08 DIAGNOSIS — Z8639 Personal history of other endocrine, nutritional and metabolic disease: Secondary | ICD-10-CM | POA: Diagnosis not present

## 2018-05-08 DIAGNOSIS — G4709 Other insomnia: Secondary | ICD-10-CM

## 2018-05-08 DIAGNOSIS — M797 Fibromyalgia: Secondary | ICD-10-CM

## 2018-05-08 DIAGNOSIS — Z8669 Personal history of other diseases of the nervous system and sense organs: Secondary | ICD-10-CM | POA: Diagnosis not present

## 2018-05-08 DIAGNOSIS — M19042 Primary osteoarthritis, left hand: Secondary | ICD-10-CM | POA: Diagnosis not present

## 2018-05-08 DIAGNOSIS — E785 Hyperlipidemia, unspecified: Secondary | ICD-10-CM | POA: Diagnosis not present

## 2018-05-08 DIAGNOSIS — M7062 Trochanteric bursitis, left hip: Secondary | ICD-10-CM

## 2018-05-08 DIAGNOSIS — M810 Age-related osteoporosis without current pathological fracture: Secondary | ICD-10-CM | POA: Diagnosis not present

## 2018-05-08 DIAGNOSIS — M5136 Other intervertebral disc degeneration, lumbar region: Secondary | ICD-10-CM | POA: Diagnosis not present

## 2018-05-08 DIAGNOSIS — M51369 Other intervertebral disc degeneration, lumbar region without mention of lumbar back pain or lower extremity pain: Secondary | ICD-10-CM

## 2018-05-08 MED ORDER — TRIAMCINOLONE ACETONIDE 40 MG/ML IJ SUSP
40.0000 mg | INTRAMUSCULAR | Status: AC | PRN
  Administered 2018-05-08: 40 mg via INTRA_ARTICULAR

## 2018-05-08 MED ORDER — LIDOCAINE HCL 1 % IJ SOLN
1.5000 mL | INTRAMUSCULAR | Status: AC | PRN
  Administered 2018-05-08: 1.5 mL

## 2018-05-08 NOTE — Telephone Encounter (Signed)
Please advise 

## 2018-05-08 NOTE — Patient Instructions (Signed)
Iliotibial Band Syndrome Rehab  Ask your health care provider which exercises are safe for you. Do exercises exactly as told by your health care provider and adjust them as directed. It is normal to feel mild stretching, pulling, tightness, or discomfort as you do these exercises, but you should stop right away if you feel sudden pain or your pain gets worse. Do not begin these exercises until told by your health care provider.  Stretching and range of motion exercises  These exercises warm up your muscles and joints and improve the movement and flexibility of your hip and pelvis.  Exercise A: Quadriceps, prone    1. Lie on your abdomen on a firm surface, such as a bed or padded floor.  2. Bend your left / right knee and hold your ankle. If you cannot reach your ankle or pant leg, loop a belt around your foot and grab the belt instead.  3. Gently pull your heel toward your buttocks. Your knee should not slide out to the side. You should feel a stretch in the front of your thigh and knee.  4. Hold this position for __________ seconds.  Repeat __________ times. Complete this stretch __________ times a day.  Exercise B: Iliotibial band    1. Lie on your side with your left / right leg in the top position.  2. Bend both of your knees and grab your left / right ankle. Stretch out your bottom arm to help you balance.  3. Slowly bring your top knee back so your thigh goes behind your trunk.  4. Slowly lower your top leg toward the floor until you feel a gentle stretch on the outside of your left / right hip and thigh. If you do not feel a stretch and your knee will not fall farther, place the heel of your other foot on top of your knee and pull your knee down toward the floor with your foot.  5. Hold this position for __________ seconds.  Repeat __________ times. Complete this stretch __________ times a day.  Strengthening exercises  These exercises build strength and endurance in your hip and pelvis. Endurance is the  ability to use your muscles for a long time, even after they get tired.  Exercise C: Straight leg raises (hip abductors)    1. Lie on your side with your left / right leg in the top position. Lie so your head, shoulder, knee, and hip line up. You may bend your bottom knee to help you balance.  2. Roll your hips slightly forward so your hips are stacked directly over each other and your left / right knee is facing forward.  3. Tense the muscles in your outer thigh and lift your top leg 4-6 inches (10-15 cm).  4. Hold this position for __________ seconds.  5. Slowly return to the starting position. Let your muscles relax completely before doing another repetition.  Repeat __________ times. Complete this exercise __________ times a day.  Exercise D: Straight leg raises (hip extensors)  1. Lie on your abdomen on your bed or a firm surface. You can put a pillow under your hips if that is more comfortable.  2. Bend your left / right knee so your foot is straight up in the air.  3. Squeeze your buttock muscles and lift your left / right thigh off the bed. Do not let your back arch.  4. Tense this muscle as hard as you can without increasing any knee pain.  5. Hold   this position for __________ seconds.  6. Slowly lower your leg to the starting position and allow it to relax completely.  Repeat __________ times. Complete this exercise __________ times a day.  Exercise E: Hip hike  1. Stand sideways on a bottom step. Stand on your left / right leg with your other foot unsupported next to the step. You can hold onto the railing or wall if needed for balance.  2. Keep your knees straight and your torso square. Then, lift your left / right hip up toward the ceiling.  3. Slowly let your left / right hip lower toward the floor, past the starting position. Your foot should get closer to the floor. Do not lean or bend your knees.  Repeat __________ times. Complete this exercise __________ times a day.  This information is not  intended to replace advice given to you by your health care provider. Make sure you discuss any questions you have with your health care provider.  Document Released: 04/08/2005 Document Revised: 12/12/2015 Document Reviewed: 03/10/2015  Elsevier Interactive Patient Education © 2019 [employer]

## 2018-06-01 ENCOUNTER — Telehealth: Payer: Self-pay | Admitting: Rheumatology

## 2018-06-01 NOTE — Telephone Encounter (Signed)
Patient called stating she had a cortisone injection at her last appointment on 05/08/18 and has been going to physical therapy and doing exercises, but she still has pain in her left leg.  Patient states the pain has gotten worse since the injection.  Patient is requesting a return call to discuss what to do next.

## 2018-06-02 ENCOUNTER — Telehealth: Payer: Self-pay | Admitting: Rheumatology

## 2018-06-02 ENCOUNTER — Encounter: Payer: Self-pay | Admitting: Rheumatology

## 2018-06-02 NOTE — Telephone Encounter (Signed)
Patient left a voicemail stating she left a message yesterday.  Patient states her leg is hurting and she is having difficulty walking.  Patient requested a return call.

## 2018-06-02 NOTE — Telephone Encounter (Signed)
Attempted to contact the patient and left message for patient to call the office.  

## 2018-06-02 NOTE — Telephone Encounter (Signed)
See previous phone note.  

## 2018-06-05 NOTE — Progress Notes (Deleted)
Office Visit Note  Patient: [name]             Date of Birth: 1941/01/03           MRN: [id number]             PCP: Wenda Low, MD Referring: Wenda Low, MD Visit Date: 06/11/2018 Occupation: '@GUAROCC' @  Subjective:  No chief complaint on file.   History of Present Illness: [name] is a 78 y.o. female ***   Activities of Daily Living:  Patient reports morning stiffness for *** {minute/hour:19697}.   Patient {ACTIONS;DENIES/REPORTS:21021675::"Denies"} nocturnal pain.  Difficulty dressing/grooming: {ACTIONS;DENIES/REPORTS:21021675::"Denies"} Difficulty climbing stairs: {ACTIONS;DENIES/REPORTS:21021675::"Denies"} Difficulty getting out of chair: {ACTIONS;DENIES/REPORTS:21021675::"Denies"} Difficulty using hands for taps, buttons, cutlery, and/or writing: {ACTIONS;DENIES/REPORTS:21021675::"Denies"}  No Rheumatology ROS completed.   PMFS History:  Patient Active Problem List   Diagnosis Date Noted  . Acute blood loss as cause of postoperative anemia 03/30/2017  . Diabetic polyneuropathy associated with secondary diabetes mellitus ([place]) 03/30/2017  . Asthma 03/30/2017  . Dyslipidemia 03/26/2017  . Lumbar stenosis with neurogenic claudication 03/17/2017  . Spinal stenosis of lumbar region 03/03/2017  . Myoclonic jerking   . Nausea   . Acute encephalopathy 07/15/2016  . Lumbar radiculopathy 06/27/2016  . Hypothyroidism 06/26/2016  . Labile blood glucose   . Surgery, elective   . Post-operative pain   . Sickle cell trait ([place])   . Acute blood loss anemia   . History of back surgery   . AKI (acute kidney injury) ([place])   . Herniated nucleus pulposus, L2-3 06/25/2016  . Bilateral primary osteoarthritis of knee 03/26/2016  . Osteoarthritis, hand 03/26/2016  . Other insomnia 03/26/2016  . Memory disorder 02/16/2016  . Fibromyalgia 09/27/2014  . Nocturnal leg cramps 09/27/2014  . Body mass index (BMI) of 30.0-30.9 in adult 08/04/2014  . Neuropathy  03/10/2014  . Allergic rhinitis 03/10/2014  . Osteoporosis 03/10/2014  . Spinal stenosis of lumbar region with radiculopathy 02/28/2014  . Type II or unspecified type diabetes mellitus without mention of complication, uncontrolled 07/08/2013  . Hypokalemia 11/02/2012  . Essential hypertension, benign 11/02/2012  . Diabetes mellitus with neuropathy ([place]) 10/29/2012  . Hyperlipidemia 10/29/2012  . Spondylolisthesis of lumbar region 03/19/2011  . Lumbar radicular pain 03/19/2011    Past Medical History:  Diagnosis Date  . Acute blood loss anemia   . Acute encephalopathy 07/15/2016  . AKI (acute kidney injury) ([place])   . Anemia    has sickle cell trait  . Anxiety   . Arthritis   . Asthma    has used inhaler in past for asthmatic bronchitis, last time- early 2012  . Bilateral primary osteoarthritis of knee 03/26/2016  . Complication of anesthesia    wakes up shaking  . Diabetes mellitus   . Diabetes mellitus with neuropathy ([place]) 10/29/2012  . Diverticulitis   . Dyslipidemia   . Encephalopathy 06/2016   due to medications after surgery  . Fibromyalgia   . GERD (gastroesophageal reflux disease)    occas. use of  Prilosec  . Heart murmur    sees Dr. Montez Morita, last seen- early 2012  . Herniated nucleus pulposus, L2-3 06/25/2016  . History of back surgery   . Hypertension    02/2010- stress test /w PCP  . Hypothyroidism   . Loose bowel movements 12/2016  . Lumbar radiculopathy 06/27/2016  . Lumbar stenosis with neurogenic claudication 03/17/2017  . Memory disorder 02/16/2016  . Myoclonic jerking   . Neuromuscular disorder ([place])  lumbar radiculopathy, lumbago  . Nocturnal leg cramps 09/27/2014  . Osteoporosis 03/10/2014  . Pneumonia   . Sickle cell trait ([place])   . Sleep apnea    borderline sleep apnea, states she no longer uses, early 2012- stopped using   . Spinal stenosis of lumbar region with radiculopathy 02/28/2014  . Spondylolisthesis of lumbar region 03/19/2011  . Type II  or unspecified type diabetes mellitus without mention of complication, uncontrolled 07/08/2013    Family History  Problem Relation Age of Onset  . Ovarian cancer Mother   . Cancer - Prostate Father   . Breast cancer Paternal Aunt   . Multiple myeloma Paternal Aunt   . Anesthesia problems Neg Hx   . Hypotension Neg Hx   . Malignant hyperthermia Neg Hx   . Pseudochol deficiency Neg Hx    Past Surgical History:  Procedure Laterality Date  . ABDOMINAL HYSTERECTOMY    . adb.cyst     ovarian cyst  . BACK SURGERY     2012, 2015 (3 total)  . BACK SURGERY  2018   02/24/2017  . COLONOSCOPY    . EYE SURGERY     macular degeneration treatment - injections  . OVARIAN CYST SURGERY    . POSTERIOR LUMBAR FUSION 4 LEVEL N/A 03/17/2017   Procedure: Decompression of Lumbar One-Two with Thoracic Ten to Lumbar Two Fusion;  Surgeon: Kristeen Miss, MD;  Location: [facility];  Service: Neurosurgery;  Laterality: N/A;  Decompression of L1-2 with T10 to L2 Fusion   Social History   Social History Narrative   Patient drinks caffeine occasionally.   Patient is right handed.   Admitted to [facility]21[facility]17   Divorced    Former smoker - stopped 2000    Alcohol - occasionally wine at dinner   Full code   Immunization History  Administered Date(s) Administered  . Influenza, High Dose Seasonal PF 01/26/2018  . Influenza,inj,Quad PF,6+ Mos 02/18/2013, 02/09/2014  . Influenza-Unspecified 01/16/2015  . Pneumococcal Conjugate-13 08/29/2014  . Pneumococcal Polysaccharide-23 04/23/2005     Objective: Vital Signs: There were no vitals taken for this visit.   Physical Exam   Musculoskeletal Exam: ***  CDAI Exam: CDAI Score: Not documented Patient Global Assessment: Not documented; Provider Global Assessment: Not documented Swollen: Not documented; Tender: Not documented Joint Exam   Not documented   There is currently no information documented on the homunculus. Go to the  Rheumatology activity and complete the homunculus joint exam.  Investigation: No additional findings.  Imaging: No results found.  Recent Labs: Lab Results  Component Value Date   WBC 6.2 03/29/2017   HGB 9.9 (A) 03/29/2017   PLT 235 03/29/2017   NA 142 04/23/2018   K 3.7 04/23/2018   CL 103 04/23/2018   CO2 31 04/23/2018   GLUCOSE 88 04/23/2018   BUN 18 04/23/2018   CREATININE 1.06 04/23/2018   BILITOT 0.4 04/23/2018   ALKPHOS 75 04/23/2018   AST 18 04/23/2018   ALT 12 04/23/2018   PROT 6.8 04/23/2018   ALBUMIN 4.1 04/23/2018   CALCIUM 9.5 04/23/2018   GFRAA >60 03/18/2017    Speciality Comments: No specialty comments available.  Procedures:  No procedures performed Allergies: Cymbalta [duloxetine hcl]; Baclofen; Betadine [povidone iodine]; Adhesive [tape]; and Lipitor [atorvastatin]   Assessment / Plan:     Visit Diagnoses: Fibromyalgia  Trochanteric bursitis of left hip  Primary osteoarthritis of both hands  Primary osteoarthritis of right knee  DDD (degenerative disc disease),  lumbar - s/p fusion x3  Other insomnia  Age-related osteoporosis without current pathological fracture - Ttd with Fosamax in the past by her PCP.  The patient has been getting DEXA through her PCP.  History of diabetes mellitus  History of neuropathy  History of hypertension  History of sleep apnea  History of hypothyroidism  Dyslipidemia   Orders: No orders of the defined types were placed in this encounter.  No orders of the defined types were placed in this encounter.   Face-to-face time spent with patient was *** minutes. Greater than 50% of time was spent in counseling and coordination of care.  Follow-Up Instructions: No follow-ups on file.   Ofilia Neas, PA-C  Note - This record has been created using Dragon software.  Chart creation errors have been sought, but may not always  have been located. Such creation errors do not reflect on  the standard of  medical care.

## 2018-06-09 ENCOUNTER — Other Ambulatory Visit (INDEPENDENT_AMBULATORY_CARE_PROVIDER_SITE_OTHER): Payer: Medicare Other

## 2018-06-09 DIAGNOSIS — Z794 Long term (current) use of insulin: Secondary | ICD-10-CM

## 2018-06-09 DIAGNOSIS — M79605 Pain in left leg: Secondary | ICD-10-CM | POA: Diagnosis not present

## 2018-06-09 DIAGNOSIS — M5432 Sciatica, left side: Secondary | ICD-10-CM | POA: Diagnosis not present

## 2018-06-09 DIAGNOSIS — E1165 Type 2 diabetes mellitus with hyperglycemia: Secondary | ICD-10-CM | POA: Diagnosis not present

## 2018-06-09 LAB — BASIC METABOLIC PANEL
BUN: 16 mg/dL (ref 6–23)
CHLORIDE: 104 meq/L (ref 96–112)
CO2: 26 mEq/L (ref 19–32)
Calcium: 9.6 mg/dL (ref 8.4–10.5)
Creatinine, Ser: 1.02 mg/dL (ref 0.40–1.20)
GFR: 63.34 mL/min (ref >60.00)
Glucose, Bld: 129 mg/dL — ABNORMAL HIGH (ref 70–99)
Potassium: 3.5 mEq/L (ref 3.5–5.1)
Sodium: 139 mEq/L (ref 135–145)

## 2018-06-09 LAB — TSH: TSH: 3.54 u[IU]/mL (ref 0.35–4.50)

## 2018-06-10 LAB — FRUCTOSAMINE: Fructosamine: 283 umol/L (ref 0–285)

## 2018-06-11 ENCOUNTER — Encounter: Payer: Self-pay | Admitting: Endocrinology

## 2018-06-11 ENCOUNTER — Other Ambulatory Visit: Payer: Self-pay

## 2018-06-11 ENCOUNTER — Ambulatory Visit (INDEPENDENT_AMBULATORY_CARE_PROVIDER_SITE_OTHER): Payer: Medicare Other | Admitting: Endocrinology

## 2018-06-11 ENCOUNTER — Ambulatory Visit: Payer: PRIVATE HEALTH INSURANCE | Admitting: Rheumatology

## 2018-06-11 VITALS — BP 136/72 | HR 71 | Ht 64.0 in | Wt 183.4 lb

## 2018-06-11 DIAGNOSIS — E063 Autoimmune thyroiditis: Secondary | ICD-10-CM

## 2018-06-11 DIAGNOSIS — Z794 Long term (current) use of insulin: Secondary | ICD-10-CM | POA: Diagnosis not present

## 2018-06-11 DIAGNOSIS — E876 Hypokalemia: Secondary | ICD-10-CM | POA: Diagnosis not present

## 2018-06-11 DIAGNOSIS — E1165 Type 2 diabetes mellitus with hyperglycemia: Secondary | ICD-10-CM | POA: Diagnosis not present

## 2018-06-11 MED ORDER — GLUCOSE BLOOD VI STRP: ORAL_STRIP | 3 refills | Status: DC

## 2018-06-11 MED ORDER — POTASSIUM CHLORIDE CRYS ER 10 MEQ PO TBCR: 10.0000 meq | EXTENDED_RELEASE_TABLET | Freq: Once | ORAL | 3 refills | Status: DC

## 2018-06-11 MED ORDER — LEVOTHYROXINE SODIUM 50 MCG PO TABS: 50.0000 ug | ORAL_TABLET | Freq: Every day | ORAL | 3 refills | Status: DC

## 2018-06-11 NOTE — Progress Notes (Signed)
Patient ID: [name], female   DOB: October 07, 1940, 78 y.o.   MRN: [id number]    Reason for Appointment: Endocrinology follow-up  History of Present Illness    PROBLEM 1: Type 2 diabetes mellitus, date of diagnosis: 1998.   Prior history: She had previously been treated with Byetta, Glumetza, Amaryl, Victoza and  Onglyza However because of inadequate control and intolerance to drugs she was finally given pre-meal insulin along with Amaryl, Glumetza eventually stopped because of side effects and also Lantus added  Recent history:  The insulin regimen is: Novolog 6-8 at breakfast and 10/12 lunch,  acs Tresiba 24 at lunch  Oral agents: None  Her A1c has been mostly between about 6.2 -7.4, in 1/20 was higher than before at 7.3  Current blood sugar patterns and problems with management:  She has started [place] as prescribed in 1/20 because of higher readings and concern about weight gain and needing more insulin  Her blood sugars are much better with near normal readings most of the time although not checking enough readings after supper  Blood sugars are also less variable  LOWEST blood sugars are before dinnertime but she is now eating lighter meals at lunch such as salads  FASTING readings are fairly close to normal with a couple of readings below 100  However she is now taking steroid Dosepak from her PCP for the last 3 days and her blood sugars have been as high as 195  She has only increase her insulin dose by about 2 units with starting the steroids and last night glucose was 180 at bedtime  Prior to steroids her highest blood sugar was only 163 last month  Had previously tried Victoza which caused nausea  Monitors blood glucose: 1-2 times a day, readings and MEDIAN numbers as below Glucometer: One Touch ultra 2.     PRE-MEAL Fasting Lunch Dinner Bedtime Overall  Glucose range:  92-  72-137  73-163  180   Mean/median:  103  105    105   POST-MEAL PC  Breakfast PC Lunch PC Dinner  Glucose range:     Mean/median:  126     Previous readings:  PRE-MEAL Fasting Lunch Dinner Bedtime Overall  Glucose range:  104-163    198, 339   Mean/median:  135    155   POST-MEAL PC Breakfast PC Lunch PC Dinner  Glucose range:  118, 193   93, 292  Mean/median:        Meals: 2-3 meals per day at 10 am. 2 pm and 6-7 pm but inconsistent schedule.  Breakfast may be [place] sausage and egg with toast, occasionally oatmeal or cereal   Physical activity: exercise:  Off Water aerobics, previously doing 3/7 days a week  Certified Diabetes Educator visit: Most recent: 7/13.  Dietician visit: Most recent: 3/13.   Wt Readings from Last 3 Encounters:  06/11/18 183 lb 6.4 oz (83.2 kg)  05/08/18 186 lb (84.4 kg)  04/30/18 186 lb 3.2 oz (64.6 kg)   Complications: Neuropathy  LABS:  Lab Results  Component Value Date   HGBA1C 7.3 (H) 04/23/2018   HGBA1C 6.4 01/22/2018   HGBA1C 6.3 08/14/2017   Lab Results  Component Value Date   MICROALBUR <0.7 01/13/2017   LDLCALC 190 (H) 04/23/2018   CREATININE 1.02 06/09/2018   Lab Results  Component Value Date   FRUCTOSAMINE 283 06/09/2018   FRUCTOSAMINE 296 (H) 05/16/2017   FRUCTOSAMINE 322 (H) 10/10/2016    Multiple  other issues are addressed in review of systems  Lab on 06/09/2018  Component Date Value Ref Range Status  . TSH 06/09/2018 3.54  0.35 - 4.50 uIU/mL Final  . Sodium 06/09/2018 139  135 - 145 mEq/L Final  . Potassium 06/09/2018 3.5  3.5 - 5.1 mEq/L Final  . Chloride 06/09/2018 104  96 - 112 mEq/L Final  . CO2 06/09/2018 26  19 - 32 mEq/L Final  . Glucose, Bld 06/09/2018 129* 70 - 99 mg/dL Final  . BUN 06/09/2018 16  6 - 23 mg/dL Final  . Creatinine, Ser 06/09/2018 1.02  0.40 - 1.20 mg/dL Final  . Calcium 06/09/2018 9.6  8.4 - 10.5 mg/dL Final  . GFR 06/09/2018 63.34  >60.00 mL/min Final  . Fructosamine 06/09/2018 283  0 - 285 umol/L Final   Comment: Published reference interval for  apparently healthy subjects between age 10 and 30 is 42 - 285 umol/L and in a poorly controlled diabetic population is 228 - 563 umol/L with a mean of 396 umol/L.       Allergies as of 06/11/2018      Reactions   Cymbalta [duloxetine Hcl] Diarrhea, Other (See Comments)   Dizziness, headache, irritability   Baclofen    jerks    Betadine [povidone Iodine] Swelling, Other (See Comments)   SWELLING REACTION DESCRIPTION/SEVERITY UNSPECIFIED  Reaction to betadine eye drops   Adhesive [tape] Rash   Lipitor [atorvastatin] Rash      Medication List       Accurate as of June 11, 2018  1:22 PM. Always use your most recent med list.        acetaminophen 500 MG tablet Commonly known as:  TYLENOL Take 1,000 mg by mouth every 8 (eight) hours as needed for mild pain or moderate pain.   aspirin 81 MG EC tablet Take 29m by mouth once daily   BD PEN NEEDLE NANO U/F 32G X 4 MM Misc Generic drug:  Insulin Pen Needle USE AS DIRECTED   cetirizine 10 MG tablet Commonly known as:  ZYRTEC Take 10 mg by mouth daily.   Colesevelam HCl 3.75 g Pack Dissolve 1 packet in unsweetened beverage and take daily with lunch   dapagliflozin propanediol 5 MG Tabs tablet Commonly known as:  FARXIGA Take 5 mg by mouth daily.   fluticasone 50 MCG/ACT nasal spray Commonly known as:  FLONASE Place 1 spray into both nostrils daily.   gabapentin 600 MG tablet Commonly known as:  NEURONTIN Take 1 tablet (600 mg total) by mouth 3 (three) times daily.   ibuprofen 800 MG tablet Commonly known as:  ADVIL,MOTRIN Take 800 mg every 8 (eight) hours as needed by mouth for mild pain or moderate pain.   irbesartan 300 MG tablet Commonly known as:  AVAPRO Take 1 tablet (300 mg total) by mouth daily.   ketoconazole 2 % cream Commonly known as:  NIZORAL APP EXT AA QD FOR 14 DAYS   levothyroxine 25 MCG tablet Commonly known as:  SYNTHROID, LEVOTHROID TAKE 1 TABLET(25 MCG) BY MOUTH DAILY BEFORE  BREAKFAST   MAGNESIUM PO Take 250 mg by mouth 2 (two) times daily.   meloxicam 15 MG tablet Commonly known as:  MOBIC TK 1 T PO QD PRN   NOVOLOG FLEXPEN 100 UNIT/ML FlexPen Generic drug:  insulin aspart INJECT 6 UNITS UNDER THE SKIN BEFORE BREAKFAST, 10 UNITS BEFORE LUNCH, AND 9 UNITS BEFORE SUPPER   oxybutynin 5 MG tablet Commonly known as:  DITROPAN TK 1 T  PO HS   pregabalin 50 MG capsule Commonly known as:  LYRICA Take 50 mg by mouth as needed.   PROAIR RESPICLICK 741 (90 Base) MCG/ACT Aepb Generic drug:  Albuterol Sulfate Inhale 2 puffs into the lungs 3 (three) times daily as needed (shortness of breath).   pyridoxine 100 MG tablet Commonly known as:  B-6 Take 100 mg daily by mouth.   SYMBICORT 80-4.5 MCG/ACT inhaler Generic drug:  budesonide-formoterol Inhale 2 puffs into the lungs 2 (two) times daily as needed (shortness of breath).   TRESIBA FLEXTOUCH 100 UNIT/ML Sopn FlexTouch Pen Generic drug:  insulin degludec INJECT 24 UNITS UNDER THE SKIN ONCE DAILY   vitamin B-12 100 MCG tablet Commonly known as:  CYANOCOBALAMIN Take 100 mcg daily by mouth.   vitamin E 400 UNIT capsule Take 400 Units by mouth daily.       Allergies:  Allergies  Allergen Reactions  . Cymbalta [Duloxetine Hcl] Diarrhea and Other (See Comments)    Dizziness, headache, irritability  . Baclofen     jerks   . Betadine [Povidone Iodine] Swelling and Other (See Comments)    SWELLING REACTION DESCRIPTION/SEVERITY UNSPECIFIED  Reaction to betadine eye drops  . Adhesive [Tape] Rash  . Lipitor [Atorvastatin] Rash    Past Medical History:  Diagnosis Date  . Acute blood loss anemia   . Acute encephalopathy 07/15/2016  . AKI (acute kidney injury) ([place])   . Anemia    has sickle cell trait  . Anxiety   . Arthritis   . Asthma    has used inhaler in past for asthmatic bronchitis, last time- early 2012  . Bilateral primary osteoarthritis of knee 03/26/2016  . Complication of anesthesia     wakes up shaking  . Diabetes mellitus   . Diabetes mellitus with neuropathy ([place]) 10/29/2012  . Diverticulitis   . Dyslipidemia   . Encephalopathy 06/2016   due to medications after surgery  . Fibromyalgia   . GERD (gastroesophageal reflux disease)    occas. use of  Prilosec  . Heart murmur    sees Dr. Montez Morita, last seen- early 2012  . Herniated nucleus pulposus, L2-3 06/25/2016  . History of back surgery   . Hypertension    02/2010- stress test /w PCP  . Hypothyroidism   . Loose bowel movements 12/2016  . Lumbar radiculopathy 06/27/2016  . Lumbar stenosis with neurogenic claudication 03/17/2017  . Memory disorder 02/16/2016  . Myoclonic jerking   . Neuromuscular disorder ([facility])    lumbar radiculopathy, lumbago  . Nocturnal leg cramps 09/27/2014  . Osteoporosis 03/10/2014  . Pneumonia   . Sickle cell trait ([place])   . Sleep apnea    borderline sleep apnea, states she no longer uses, early 2012- stopped using   . Spinal stenosis of lumbar region with radiculopathy 02/28/2014  . Spondylolisthesis of lumbar region 03/19/2011  . Type II or unspecified type diabetes mellitus without mention of complication, uncontrolled 07/08/2013    Past Surgical History:  Procedure Laterality Date  . ABDOMINAL HYSTERECTOMY    . adb.cyst     ovarian cyst  . BACK SURGERY     2012, 2015 (3 total)  . BACK SURGERY  2018   02/24/2017  . COLONOSCOPY    . EYE SURGERY     macular degeneration treatment - injections  . OVARIAN CYST SURGERY    . POSTERIOR LUMBAR FUSION 4 LEVEL N/A 03/17/2017   Procedure: Decompression of Lumbar One-Two with Thoracic Ten to Lumbar Two Fusion;  Surgeon: Kristeen Miss, MD;  Location: [facility];  Service: Neurosurgery;  Laterality: N/A;  Decompression of L1-2 with T10 to L2 Fusion    Family History  Problem Relation Age of Onset  . Ovarian cancer Mother   . Cancer - Prostate Father   . Breast cancer Paternal Aunt   . Multiple myeloma Paternal Aunt   . Anesthesia problems  Neg Hx   . Hypotension Neg Hx   . Malignant hyperthermia Neg Hx   . Pseudochol deficiency Neg Hx     Social History:  reports that she quit smoking about 15 years ago. Her smoking use included cigarettes. She has a 3.00 pack-year smoking history. She has never used smokeless tobacco. She reports current alcohol use. She reports that she does not use drugs.  ROS    NEUROPATHY: She has had tingling in feet and legs especially at night. She is taking gabapentin without consistent relief at night At times she will take Lyrica with somewhat better relief at night This was also prescribed as needed for fibromyalgia by her rheumatologist  MUSCLE cramps: She is having less problems now with getting Gatorade and electrolyte solutions Her PCP told her to stop Zetia On Mg   Hyperlipidemia:   The lipid abnormality consists of elevated LDL , high triglyceride and did not tolerate Crestor or lovastatin She thinks they make her cramps worse  She is off Zetia, previously did  seem to have  muscle cramps  Her lipids are appearing worse and her diet has not been consistent over the last couple of months  Lab Results  Component Value Date   CHOL 281 (H) 04/23/2018   HDL 56.90 04/23/2018   LDLCALC 190 (H) 04/23/2018   LDLDIRECT 115.0 12/18/2016   TRIG 168.0 (H) 04/23/2018   CHOLHDL 5 04/23/2018      HYPERTENSION:  Has been present for several years.  This is  controlled with taking Avapro 150 from her PCP  Does monitor at home and reports good readings  POTASSIUM levels: Usually low normal and currently not taking any prescription supplement.  Also may get cramps which are better with magnesium supplements, recently having more cramps   Lab Results  Component Value Date   CREATININE 1.02 06/09/2018   BUN 16 06/09/2018   NA 139 06/09/2018   K 3.5 06/09/2018   CL 104 06/09/2018   CO2 26 06/09/2018       Hypothyroidism  She had a relatively high TSH as of 4/17 and was  empirically given Synthroid 25 g Not clear if her fatigue had improved with this However her TSH was high last visit and she thinks in the last week or so she has taken 2 tablets daily by mistake She thinks she feels less tired now  TSH is improved  TSH history as follows   Lab Results  Component Value Date   TSH 3.54 06/09/2018   TSH 5.38 (H) 04/23/2018   TSH 2.21 08/14/2017   FREET4 0.78 02/22/2016         Examination:   BP 136/72 (BP Location: Left Arm, Patient Position: Sitting, Cuff Size: Normal)   Pulse 71   Ht _0  (1.626 m)   Wt 183 lb 6.4 oz (83.2 kg)   SpO2 96%   BMI 31.48 kg/m   Body mass index is 31.48 kg/m.     Assesment/Plan:   1. DIABETES type 2 with BMI of 31  See history of present illness for detailed discussion of management, blood  sugar patterns and problems identified  Her blood sugars are significantly better with adding Farxiga to her insulin regimen  Last A1c was 7.3  Blood sugars are excellent and at least before meals and not significantly higher in the last month as well as no hypoglycemia Insulin dose has been adjusted only slightly and may need to cut down on her mealtime insulin further especially at lunchtime based on her blood sugar patterns above This week because of taking Medrol Dosepak her sugars are starting to be little higher Discussed that she can add 2 to 4 units for NovoLog with her blood sugar being higher but does need to check more often and discussed blood sugar targets both fasting and after meals  Discussed adjusting Tresiba based on fasting blood sugar patterns  also she can reduce her lunchtime dose to at least 6 units once she is off prednisone   3. Hypertension: Blood pressure is controlled with taking Avapro 1 now taking half tablet with adding Farxiga  HYPOKALEMIA: Her potassium is 3.5 and not clear if this is related to prednisone use Since her potassium is usually low normal she will start 10 mEq  prescription supplement daily   4.   Hypothyroidism: She is taking small doses and recently actively feeling better with taking 50 mcg instead of 25 and her TSH is back to normal She can continue 50 mcg  Sciatica: She is not better with the Medrol Dosepak given by PCP and she needs to go back to her neurosurgeon  Total visit time for evaluation and management of multiple problems and counseling =25 minutes  There are no Patient Instructions on file for this visit.       [name] 06/11/2018, 1:22 PM

## 2018-06-12 ENCOUNTER — Emergency Department (HOSPITAL_COMMUNITY): Payer: Medicare Other

## 2018-06-12 ENCOUNTER — Other Ambulatory Visit: Payer: Self-pay

## 2018-06-12 ENCOUNTER — Inpatient Hospital Stay (HOSPITAL_COMMUNITY)
Admission: EM | Admit: 2018-06-12 | Discharge: 2018-06-15 | DRG: 481 | Disposition: A | Discharge status: Skilled Nursing Facility | Payer: Medicare Other | Attending: Student | Admitting: Student

## 2018-06-12 ENCOUNTER — Encounter (HOSPITAL_COMMUNITY): Payer: Self-pay | Admitting: *Deleted

## 2018-06-12 DIAGNOSIS — Z807 Family history of other malignant neoplasms of lymphoid, hematopoietic and related tissues: Secondary | ICD-10-CM | POA: Diagnosis not present

## 2018-06-12 DIAGNOSIS — S72332D Displaced oblique fracture of shaft of left femur, subsequent encounter for closed fracture with routine healing: Secondary | ICD-10-CM | POA: Diagnosis not present

## 2018-06-12 DIAGNOSIS — M2550 Pain in unspecified joint: Secondary | ICD-10-CM | POA: Diagnosis not present

## 2018-06-12 DIAGNOSIS — R1312 Dysphagia, oropharyngeal phase: Secondary | ICD-10-CM | POA: Diagnosis not present

## 2018-06-12 DIAGNOSIS — K219 Gastro-esophageal reflux disease without esophagitis: Secondary | ICD-10-CM | POA: Diagnosis present

## 2018-06-12 DIAGNOSIS — Z794 Long term (current) use of insulin: Secondary | ICD-10-CM | POA: Diagnosis not present

## 2018-06-12 DIAGNOSIS — R52 Pain, unspecified: Secondary | ICD-10-CM | POA: Diagnosis not present

## 2018-06-12 DIAGNOSIS — S7292XA Unspecified fracture of left femur, initial encounter for closed fracture: Secondary | ICD-10-CM | POA: Diagnosis not present

## 2018-06-12 DIAGNOSIS — E039 Hypothyroidism, unspecified: Secondary | ICD-10-CM | POA: Diagnosis present

## 2018-06-12 DIAGNOSIS — E785 Hyperlipidemia, unspecified: Secondary | ICD-10-CM | POA: Diagnosis present

## 2018-06-12 DIAGNOSIS — W010XXA Fall on same level from slipping, tripping and stumbling without subsequent striking against object, initial encounter: Secondary | ICD-10-CM | POA: Diagnosis present

## 2018-06-12 DIAGNOSIS — R2681 Unsteadiness on feet: Secondary | ICD-10-CM | POA: Diagnosis not present

## 2018-06-12 DIAGNOSIS — E1140 Type 2 diabetes mellitus with diabetic neuropathy, unspecified: Secondary | ICD-10-CM | POA: Diagnosis not present

## 2018-06-12 DIAGNOSIS — Z79899 Other long term (current) drug therapy: Secondary | ICD-10-CM | POA: Diagnosis not present

## 2018-06-12 DIAGNOSIS — S72002A Fracture of unspecified part of neck of left femur, initial encounter for closed fracture: Secondary | ICD-10-CM | POA: Diagnosis not present

## 2018-06-12 DIAGNOSIS — S72012A Unspecified intracapsular fracture of left femur, initial encounter for closed fracture: Secondary | ICD-10-CM | POA: Diagnosis not present

## 2018-06-12 DIAGNOSIS — M810 Age-related osteoporosis without current pathological fracture: Secondary | ICD-10-CM | POA: Diagnosis present

## 2018-06-12 DIAGNOSIS — Z7982 Long term (current) use of aspirin: Secondary | ICD-10-CM

## 2018-06-12 DIAGNOSIS — I10 Essential (primary) hypertension: Secondary | ICD-10-CM | POA: Diagnosis present

## 2018-06-12 DIAGNOSIS — D649 Anemia, unspecified: Secondary | ICD-10-CM | POA: Diagnosis not present

## 2018-06-12 DIAGNOSIS — Z87891 Personal history of nicotine dependence: Secondary | ICD-10-CM

## 2018-06-12 DIAGNOSIS — Z7401 Bed confinement status: Secondary | ICD-10-CM | POA: Diagnosis not present

## 2018-06-12 DIAGNOSIS — Z981 Arthrodesis status: Secondary | ICD-10-CM

## 2018-06-12 DIAGNOSIS — Z8041 Family history of malignant neoplasm of ovary: Secondary | ICD-10-CM

## 2018-06-12 DIAGNOSIS — G8929 Other chronic pain: Secondary | ICD-10-CM | POA: Diagnosis not present

## 2018-06-12 DIAGNOSIS — J45909 Unspecified asthma, uncomplicated: Secondary | ICD-10-CM | POA: Diagnosis present

## 2018-06-12 DIAGNOSIS — E1142 Type 2 diabetes mellitus with diabetic polyneuropathy: Secondary | ICD-10-CM | POA: Diagnosis not present

## 2018-06-12 DIAGNOSIS — S79929A Unspecified injury of unspecified thigh, initial encounter: Secondary | ICD-10-CM | POA: Diagnosis not present

## 2018-06-12 DIAGNOSIS — M170 Bilateral primary osteoarthritis of knee: Secondary | ICD-10-CM | POA: Diagnosis not present

## 2018-06-12 DIAGNOSIS — Z79890 Hormone replacement therapy (postmenopausal): Secondary | ICD-10-CM

## 2018-06-12 DIAGNOSIS — S72332A Displaced oblique fracture of shaft of left femur, initial encounter for closed fracture: Secondary | ICD-10-CM | POA: Diagnosis not present

## 2018-06-12 DIAGNOSIS — J4540 Moderate persistent asthma, uncomplicated: Secondary | ICD-10-CM | POA: Diagnosis not present

## 2018-06-12 DIAGNOSIS — M48061 Spinal stenosis, lumbar region without neurogenic claudication: Secondary | ICD-10-CM | POA: Diagnosis present

## 2018-06-12 DIAGNOSIS — R0902 Hypoxemia: Secondary | ICD-10-CM | POA: Diagnosis not present

## 2018-06-12 DIAGNOSIS — Z419 Encounter for procedure for purposes other than remedying health state, unspecified: Secondary | ICD-10-CM

## 2018-06-12 DIAGNOSIS — S72009A Fracture of unspecified part of neck of unspecified femur, initial encounter for closed fracture: Secondary | ICD-10-CM

## 2018-06-12 DIAGNOSIS — S72092A Other fracture of head and neck of left femur, initial encounter for closed fracture: Secondary | ICD-10-CM | POA: Diagnosis not present

## 2018-06-12 DIAGNOSIS — Z9181 History of falling: Secondary | ICD-10-CM | POA: Diagnosis not present

## 2018-06-12 DIAGNOSIS — R262 Difficulty in walking, not elsewhere classified: Secondary | ICD-10-CM | POA: Diagnosis not present

## 2018-06-12 DIAGNOSIS — M797 Fibromyalgia: Secondary | ICD-10-CM | POA: Diagnosis present

## 2018-06-12 DIAGNOSIS — D573 Sickle-cell trait: Secondary | ICD-10-CM | POA: Diagnosis present

## 2018-06-12 DIAGNOSIS — N179 Acute kidney failure, unspecified: Secondary | ICD-10-CM | POA: Diagnosis not present

## 2018-06-12 DIAGNOSIS — M6281 Muscle weakness (generalized): Secondary | ICD-10-CM | POA: Diagnosis not present

## 2018-06-12 DIAGNOSIS — M47816 Spondylosis without myelopathy or radiculopathy, lumbar region: Secondary | ICD-10-CM | POA: Diagnosis not present

## 2018-06-12 DIAGNOSIS — I959 Hypotension, unspecified: Secondary | ICD-10-CM | POA: Diagnosis not present

## 2018-06-12 DIAGNOSIS — R609 Edema, unspecified: Secondary | ICD-10-CM | POA: Diagnosis not present

## 2018-06-12 DIAGNOSIS — W19XXXA Unspecified fall, initial encounter: Secondary | ICD-10-CM | POA: Diagnosis not present

## 2018-06-12 LAB — CBC WITH DIFFERENTIAL/PLATELET
Abs Immature Granulocytes: 0.07 10*3/uL (ref 0.00–0.07)
Basophils Absolute: 0 10*3/uL (ref 0.0–0.1)
Basophils Relative: 0 %
Eosinophils Absolute: 0 10*3/uL (ref 0.0–0.5)
Eosinophils Relative: 0 %
HCT: 47.7 % — ABNORMAL HIGH (ref 36.0–46.0)
Hemoglobin: 14.8 g/dL (ref 12.0–15.0)
Immature Granulocytes: 1 %
Lymphocytes Relative: 10 %
Lymphs Abs: 0.8 10*3/uL (ref 0.7–4.0)
MCH: 26.9 pg (ref 26.0–34.0)
MCHC: 31 g/dL (ref 30.0–36.0)
MCV: 86.7 fL (ref 80.0–100.0)
MONO ABS: 0.4 10*3/uL (ref 0.1–1.0)
Monocytes Relative: 5 %
Neutro Abs: 6.6 10*3/uL (ref 1.7–7.7)
Neutrophils Relative %: 84 %
Platelets: 282 10*3/uL (ref 150–400)
RBC: 5.5 MIL/uL — AB (ref 3.87–5.11)
RDW: 14.4 % (ref 11.5–15.5)
WBC: 7.9 10*3/uL (ref 4.0–10.5)
nRBC: 0 % (ref 0.0–0.2)

## 2018-06-12 LAB — GLUCOSE, CAPILLARY: GLUCOSE-CAPILLARY: 128 mg/dL — AB (ref 70–99)

## 2018-06-12 LAB — BASIC METABOLIC PANEL
Anion gap: 8 (ref 5–15)
BUN: 27 mg/dL — ABNORMAL HIGH (ref 8–23)
CO2: 27 mmol/L (ref 22–32)
Calcium: 9.6 mg/dL (ref 8.9–10.3)
Chloride: 103 mmol/L (ref 98–111)
Creatinine, Ser: 0.98 mg/dL (ref 0.44–1.00)
GFR calc Af Amer: >60 mL/min (ref >60)
GFR calc non Af Amer: 55 mL/min — ABNORMAL LOW (ref >60)
Glucose, Bld: 121 mg/dL — ABNORMAL HIGH (ref 70–99)
Potassium: 3.8 mmol/L (ref 3.5–5.1)
SODIUM: 138 mmol/L (ref 135–145)

## 2018-06-12 MED ORDER — HYDROMORPHONE HCL 1 MG/ML IJ SOLN
1.0000 mg | INTRAMUSCULAR | Status: DC | PRN
  Administered 2018-06-12: 1 mg via INTRAVENOUS
  Filled 2018-06-12: qty 1

## 2018-06-12 MED ORDER — HYDROMORPHONE HCL 1 MG/ML IJ SOLN
1.0000 mg | Freq: Once | INTRAMUSCULAR | Status: AC
  Administered 2018-06-12: 1 mg via INTRAVENOUS
  Filled 2018-06-12: qty 1

## 2018-06-12 MED ORDER — INSULIN ASPART 100 UNIT/ML ~~LOC~~ SOLN: 0.0000 [IU] | Freq: Every day | SUBCUTANEOUS | Status: DC

## 2018-06-12 MED ORDER — LEVOTHYROXINE SODIUM 50 MCG PO TABS
50.0000 ug | ORAL_TABLET | Freq: Every day | ORAL | Status: DC
  Administered 2018-06-13 – 2018-06-15 (×3): 50 ug via ORAL
  Filled 2018-06-12 (×3): qty 1

## 2018-06-12 MED ORDER — IRBESARTAN 150 MG PO TABS
300.0000 mg | ORAL_TABLET | Freq: Every day | ORAL | Status: DC
  Administered 2018-06-13: 300 mg via ORAL
  Filled 2018-06-12: qty 2

## 2018-06-12 MED ORDER — MOMETASONE FURO-FORMOTEROL FUM 100-5 MCG/ACT IN AERO
2.0000 | INHALATION_SPRAY | Freq: Two times a day (BID) | RESPIRATORY_TRACT | Status: DC
  Administered 2018-06-13 – 2018-06-15 (×5): 2 via RESPIRATORY_TRACT
  Filled 2018-06-12: qty 8.8

## 2018-06-12 MED ORDER — INSULIN ASPART 100 UNIT/ML ~~LOC~~ SOLN
0.0000 [IU] | Freq: Three times a day (TID) | SUBCUTANEOUS | Status: DC
  Administered 2018-06-13 – 2018-06-14 (×2): 5 [IU] via SUBCUTANEOUS
  Administered 2018-06-15: 3 [IU] via SUBCUTANEOUS
  Administered 2018-06-15: 8 [IU] via SUBCUTANEOUS

## 2018-06-12 MED ORDER — KCL IN DEXTROSE-NACL 10-5-0.45 MEQ/L-%-% IV SOLN
INTRAVENOUS | Status: DC
  Administered 2018-06-13 (×2): via INTRAVENOUS
  Filled 2018-06-12 (×3): qty 1000

## 2018-06-12 MED ORDER — METHOCARBAMOL 500 MG PO TABS
500.0000 mg | ORAL_TABLET | Freq: Four times a day (QID) | ORAL | Status: DC | PRN
  Administered 2018-06-12 – 2018-06-13 (×2): 500 mg via ORAL
  Filled 2018-06-12 (×5): qty 1

## 2018-06-12 MED ORDER — ALBUTEROL SULFATE (2.5 MG/3ML) 0.083% IN NEBU: 3.0000 mL | INHALATION_SOLUTION | Freq: Three times a day (TID) | RESPIRATORY_TRACT | Status: DC | PRN

## 2018-06-12 MED ORDER — GABAPENTIN 300 MG PO CAPS
600.0000 mg | ORAL_CAPSULE | Freq: Three times a day (TID) | ORAL | Status: DC
  Administered 2018-06-12 – 2018-06-15 (×6): 600 mg via ORAL
  Filled 2018-06-12 (×7): qty 2

## 2018-06-12 MED ORDER — FENTANYL CITRATE (PF) 100 MCG/2ML IJ SOLN
50.0000 ug | Freq: Once | INTRAMUSCULAR | Status: AC
  Administered 2018-06-12: 50 ug via INTRAVENOUS
  Filled 2018-06-12: qty 2

## 2018-06-12 NOTE — ED Provider Notes (Addendum)
[facility] DEPT Provider Note   CSN: [id number] Arrival date & time: 06/12/18  1708    History   Chief Complaint Chief Complaint  Patient presents with  . Fall  . Leg Pain    HPI MERIA CRILLY is a 78 y.o. female.     The history is provided by the patient.  Fall  This is a new problem. The current episode started less than 1 hour ago. The problem occurs constantly. The problem has not changed since onset.Pertinent negatives include no chest pain, no abdominal pain, no headaches and no shortness of breath. Exacerbated by: movement. Nothing relieves the symptoms. She has tried nothing for the symptoms. The treatment provided no relief.  Leg Pain  Associated symptoms: no back pain and no fever     Past Medical History:  Diagnosis Date  . Acute blood loss anemia   . Acute encephalopathy 07/15/2016  . AKI (acute kidney injury) ([place])   . Anemia    has sickle cell trait  . Anxiety   . Arthritis   . Asthma    has used inhaler in past for asthmatic bronchitis, last time- early 2012  . Bilateral primary osteoarthritis of knee 03/26/2016  . Complication of anesthesia    wakes up shaking  . Diabetes mellitus   . Diabetes mellitus with neuropathy ([place]) 10/29/2012  . Diverticulitis   . Dyslipidemia   . Encephalopathy 06/2016   due to medications after surgery  . Fibromyalgia   . GERD (gastroesophageal reflux disease)    occas. use of  Prilosec  . Heart murmur    sees Dr. Montez Morita, last seen- early 2012  . Herniated nucleus pulposus, L2-3 06/25/2016  . History of back surgery   . Hypertension    02/2010- stress test /w PCP  . Hypothyroidism   . Loose bowel movements 12/2016  . Lumbar radiculopathy 06/27/2016  . Lumbar stenosis with neurogenic claudication 03/17/2017  . Memory disorder 02/16/2016  . Myoclonic jerking   . Neuromuscular disorder (H[facility])    lumbar radiculopathy, lumbago  . Nocturnal leg cramps 09/27/2014  . Osteoporosis 03/10/2014   . Pneumonia   . Sickle cell trait ([place])   . Sleep apnea    borderline sleep apnea, states she no longer uses, early 2012- stopped using   . Spinal stenosis of lumbar region with radiculopathy 02/28/2014  . Spondylolisthesis of lumbar region 03/19/2011  . Type II or unspecified type diabetes mellitus without mention of complication, uncontrolled 07/08/2013    Patient Active Problem List   Diagnosis Date Noted  . Acute blood loss as cause of postoperative anemia 03/30/2017  . Diabetic polyneuropathy associated with secondary diabetes mellitus ([place]) 03/30/2017  . Asthma 03/30/2017  . Dyslipidemia 03/26/2017  . Lumbar stenosis with neurogenic claudication 03/17/2017  . Spinal stenosis of lumbar region 03/03/2017  . Myoclonic jerking   . Nausea   . Acute encephalopathy 07/15/2016  . Lumbar radiculopathy 06/27/2016  . Hypothyroidism 06/26/2016  . Labile blood glucose   . Surgery, elective   . Post-operative pain   . Sickle cell trait ([place])   . Acute blood loss anemia   . History of back surgery   . AKI (acute kidney injury) ([place])   . Herniated nucleus pulposus, L2-3 06/25/2016  . Bilateral primary osteoarthritis of knee 03/26/2016  . Osteoarthritis, hand 03/26/2016  . Other insomnia 03/26/2016  . Memory disorder 02/16/2016  . Fibromyalgia 09/27/2014  . Nocturnal leg cramps 09/27/2014  . Body mass index (BMI)  of 30.0-30.9 in adult 08/04/2014  . Neuropathy 03/10/2014  . Allergic rhinitis 03/10/2014  . Osteoporosis 03/10/2014  . Spinal stenosis of lumbar region with radiculopathy 02/28/2014  . Type II or unspecified type diabetes mellitus without mention of complication, uncontrolled 07/08/2013  . Hypokalemia 11/02/2012  . Essential hypertension, benign 11/02/2012  . Diabetes mellitus with neuropathy ([place]) 10/29/2012  . Hyperlipidemia 10/29/2012  . Spondylolisthesis of lumbar region 03/19/2011  . Lumbar radicular pain 03/19/2011    Past Surgical History:  Procedure  Laterality Date  . ABDOMINAL HYSTERECTOMY    . adb.cyst     ovarian cyst  . BACK SURGERY     2012, 2015 (3 total)  . BACK SURGERY  2018   02/24/2017  . COLONOSCOPY    . EYE SURGERY     macular degeneration treatment - injections  . OVARIAN CYST SURGERY    . POSTERIOR LUMBAR FUSION 4 LEVEL N/A 03/17/2017   Procedure: Decompression of Lumbar One-Two with Thoracic Ten to Lumbar Two Fusion;  Surgeon: Kristeen Miss, MD;  Location: [facility];  Service: Neurosurgery;  Laterality: N/A;  Decompression of L1-2 with T10 to L2 Fusion     OB History   No obstetric history on file.      Home Medications    Prior to Admission medications   Medication Sig Start Date End Date Taking? Authorizing Provider  aspirin 81 MG EC tablet Take 81 mg by mouth daily.  01/16/15  Yes [provider]  dapagliflozin propanediol (FARXIGA) 5 MG TABS tablet Take 5 mg by mouth daily. 04/30/18  Yes [name], MD  diphenhydrAMINE (BENADRYL) 25 MG tablet Take 25 mg by mouth every 6 (six) hours as needed for allergies.   Yes [provider]  fluticasone (FLONASE) 50 MCG/ACT nasal spray Place 1 spray into both nostrils daily as needed for allergies.    Yes [provider]  gabapentin (NEURONTIN) 600 MG tablet Take 1 tablet (600 mg total) by mouth 3 (three) times daily. 01/15/18  Yes Kathrynn Ducking, MD  ibuprofen (ADVIL,MOTRIN) 800 MG tablet Take 800 mg every 8 (eight) hours as needed by mouth for mild pain or moderate pain.  08/09/16  Yes [provider]  irbesartan (AVAPRO) 300 MG tablet Take 1 tablet (300 mg total) by mouth daily. 08/18/17  Yes [name], MD  levothyroxine (SYNTHROID, LEVOTHROID) 50 MCG tablet Take 1 tablet (50 mcg total) by mouth daily. 06/11/18  Yes [name], MD  MAGNESIUM PO Take 250 mg by mouth 2 (two) times daily.    Yes [provider]  meloxicam (MOBIC) 15 MG tablet Take 15 mg by mouth daily.  04/16/18  Yes [provider]  NOVOLOG FLEXPEN 100  UNIT/ML FlexPen INJECT 6 UNITS UNDER THE SKIN BEFORE BREAKFAST, 10 UNITS BEFORE LUNCH, AND 9 UNITS BEFORE SUPPER Patient taking differently: Inject 6-10 Units into the skin 3 (three) times daily with meals.  03/23/18  Yes [name], MD  potassium chloride (K-DUR,KLOR-CON) 10 MEQ tablet Take 10 mEq by mouth 2 (two) times daily.  06/11/18  Yes [provider]  predniSONE (STERAPRED UNI-PAK 21 TAB) 10 MG (21) TBPK tablet Take 10-60 mg by mouth as directed. Day 1: Take 6 tablets (60 mg)  Day 2: Take 5 tablets (50 mg) Day 3: Take 4 tablets (40 mg) Day 4: Take 3 tablets (30 mg) Day 5: Take 2 tablets (20 mg) Day 6: Take 1 tablet (10 mg) 06/09/18  Yes [provider]  [place] 735 (  90 Base) MCG/ACT AEPB Inhale 2 puffs into the lungs 3 (three) times daily as needed (shortness of breath).  05/29/16  Yes [provider]  TRESIBA FLEXTOUCH 100 UNIT/ML SOPN FlexTouch Pen INJECT 24 UNITS UNDER THE SKIN ONCE DAILY Patient taking differently: Inject 24 Units into the skin every morning.  08/20/17  Yes [name], MD  acetaminophen (TYLENOL) 500 MG tablet Take 1,000 mg by mouth every 8 (eight) hours as needed for mild pain or moderate pain.     [provider]  BD PEN NEEDLE NANO U/F 32G X 4 MM MISC USE AS DIRECTED 04/06/18   [name], MD  cetirizine (ZYRTEC) 10 MG tablet Take 10 mg by mouth daily as needed for allergies.     [provider]  Colesevelam HCl 3.75 g PACK Dissolve 1 packet in unsweetened beverage and take daily with lunch Patient not taking: Reported on 06/12/2018 01/26/18   [name], MD  glucose blood (ONE TOUCH ULTRA TEST) test strip Use as instructed to check blood sugar daily. 06/11/18   [name], MD  potassium chloride (K-DUR,KLOR-CON) 10 MEQ tablet Take 1 tablet (10 mEq total) by mouth once for 1 dose. 06/11/18 06/11/18  [name], MD  pregabalin (LYRICA) 50 MG capsule Take 50 mg by mouth daily as needed (pain).     [provider]  SYMBICORT 80-4.5 MCG/ACT inhaler Inhale 2 puffs into the lungs 2 (two) times daily as needed (shortness of breath).  05/29/16   [provider]    Family History Family History  Problem Relation Age of Onset  . Ovarian cancer Mother   . Cancer - Prostate Father   . Breast cancer Paternal Aunt   . Multiple myeloma Paternal Aunt   . Anesthesia problems Neg Hx   . Hypotension Neg Hx   . Malignant hyperthermia Neg Hx   . Pseudochol deficiency Neg Hx     Social History Social History   Tobacco Use  . Smoking status: Former Smoker    Packs/day: 0.10    Years: 30.00    Pack years: 3.00    Types: Cigarettes    Last attempt to quit: 2005    Years since quitting: 15.1  . Smokeless tobacco: Never Used  Substance Use Topics  . Alcohol use: Yes    Comment: wine /w dinner on occas.  . Drug use: Never     Allergies   Cymbalta [duloxetine hcl]; Baclofen; Betadine [povidone iodine]; Adhesive [tape]; and Lipitor [atorvastatin]   Review of Systems Review of Systems  Constitutional: Negative for chills and fever.  HENT: Negative for ear pain and sore throat.   Eyes: Negative for pain and visual disturbance.  Respiratory: Negative for cough and shortness of breath.   Cardiovascular: Negative for chest pain and palpitations.  Gastrointestinal: Negative for abdominal pain and vomiting.  Genitourinary: Negative for dysuria and hematuria.  Musculoskeletal: Positive for arthralgias and gait problem. Negative for back pain.  Skin: Negative for color change and rash.  Neurological: Negative for seizures, syncope and headaches.  All other systems reviewed and are negative.    Physical Exam Updated Vital Signs  ED Triage Vitals  Enc Vitals Group     BP 06/12/18 1721 (!) 187/106     Pulse Rate 06/12/18 1721 68     Resp 06/12/18 1721 20     Temp 06/12/18 1721 98 F (36.7 C)     Temp Source 06/12/18 1721 Oral     SpO2 06/12/18 1720 98 %  Weight --      Height --       Head Circumference --      Peak Flow --      Pain Score 06/12/18 1730 8     Pain Loc --      Pain Edu? --      Excl. in [place]? --     Physical Exam Vitals signs and nursing note reviewed.  Constitutional:      General: She is not in acute distress.    Appearance: She is well-developed.  HENT:     Head: Normocephalic and atraumatic.  Eyes:     Extraocular Movements: Extraocular movements intact.     Conjunctiva/sclera: Conjunctivae normal.     Pupils: Pupils are equal, round, and reactive to light.  Neck:     Musculoskeletal: Normal range of motion and neck supple. No muscular tenderness.  Cardiovascular:     Rate and Rhythm: Normal rate and regular rhythm.     Pulses: Normal pulses.     Heart sounds: Normal heart sounds. No murmur.  Pulmonary:     Effort: Pulmonary effort is normal. No respiratory distress.     Breath sounds: Normal breath sounds.  Abdominal:     Palpations: Abdomen is soft.     Tenderness: There is no abdominal tenderness.  Musculoskeletal:        General: Tenderness (left hip/femur) and deformity (LLE) present.  Skin:    General: Skin is warm and dry.     Capillary Refill: Capillary refill takes less than 2 seconds.  Neurological:     General: No focal deficit present.     Mental Status: She is alert.     Sensory: No sensory deficit.     Motor: No weakness.      ED Treatments / Results  Labs (all labs ordered are listed, but only abnormal results are displayed) Labs Reviewed  CBC WITH DIFFERENTIAL/PLATELET - Abnormal; Notable for the following components:      Result Value   RBC 5.50 (*)    HCT 47.7 (*)    All other components within normal limits  BASIC METABOLIC PANEL - Abnormal; Notable for the following components:   Glucose, Bld 121 (*)    BUN 27 (*)    GFR calc non Af Amer 55 (*)    All other components within normal limits    EKG EKG Interpretation  Date/Time:  Friday June 12 2018 20:08:09 EST Ventricular Rate:  57 PR  Interval:    QRS Duration: 91 QT Interval:  395 QTC Calculation: 385 R Axis:   71 Text Interpretation:  Sinus rhythm Confirmed by Lennice Sites [phone]) on 06/12/2018 8:11:03 PM   Radiology Dg Hip Unilat With Pelvis 2-3 Views Left  Result Date: 06/12/2018 CLINICAL DATA:  Twisted thigh, hip pain EXAM: DG HIP (WITH OR WITHOUT PELVIS) 2-3V LEFT COMPARISON:  None. FINDINGS: Postsurgical changes at the lumbosacral spine. The SI joints are non widened. The pubic symphysis and rami are intact. Both femoral heads project in joint. Acute fracture involving the proximal shaft of the femur at the junction of the proximal and middle thirds. Mild varus angulation of distal fracture fragment. One bone with posterior and close to 1 bone with central displacement of distal fracture fragment. IMPRESSION: Acute displaced and angulated proximal femoral shaft fracture Electronically Signed   By: Donavan Foil M.D.   On: 06/12/2018 19:20   Dg Femur Min 2 Views Left  Result Date: 06/12/2018 CLINICAL DATA:  Fall  EXAM: LEFT FEMUR 2 VIEWS COMPARISON:  None. FINDINGS: Acute fracture proximal shaft of the femur at the junction of the proximal and middle thirds. Mild varus angulation of distal fracture fragment. Slightly under 1 bone with of central displacement and 1 bone with posterior displacement of distal fracture fragment. 2.9 cm of overriding. IMPRESSION: Acute displaced, angulated and overriding fracture involving the proximal shaft of the femur Electronically Signed   By: Donavan Foil M.D.   On: 06/12/2018 19:21    Procedures Procedures (including critical care time)  Medications Ordered in ED Medications  fentaNYL (SUBLIMAZE) injection 50 mcg (50 mcg Intravenous Given 06/12/18 1859)  HYDROmorphone (DILAUDID) injection 1 mg (1 mg Intravenous Given 06/12/18 1939)     Initial Impression / Assessment and Plan / ED Course  I have reviewed the triage vital signs and the nursing notes.  Pertinent labs & imaging  results that were available during my care of the patient were reviewed by me and considered in my medical decision making (see chart for details).        LORMA HEATER is a 78 year old female with history of fibromyalgia, diabetes, anemia, diverticulitis who presents to the ED with left leg pain after fall.  Patient with unremarkable vitals.  No fever.  Patient states that she stepped off of a curb at a gas station and felt her thigh twist underneath her.  Did not hit her head or lose consciousness.  She has deformity to the left thigh.  Is neurovascularly neuromuscularly intact on exam.  X-ray shows proximal femur fracture.  No hip fracture.  Patient was given fentanyl for pain.  Medical screening labs were ordered that were overall unremarkable.  EKG shows sinus rhythm.  Talked with Dr. Ninfa Linden with orthopedics who recommends that patient be placed in [facility] traction and be transferred over to [facility] for likely surgery in the morning.  Patient to be made n.p.o. at midnight.  IV Dilaudid ordered for further pain management.  Patient admitted to the hospital service for further care.  Hemodynamically stable throughout my care.  This chart was dictated using voice recognition software.  Despite best efforts to proofread,  errors can occur which can change the documentation meaning.    Final Clinical Impressions(s) / ED Diagnoses   Final diagnoses:  Closed fracture of left femur, unspecified fracture morphology, unspecified portion of femur, initial encounter [facility])    ED Discharge Orders    None       Lennice Sites, DO 06/12/18 1947    Lennice Sites, DO 06/12/18 2011

## 2018-06-12 NOTE — ED Triage Notes (Signed)
Pt BIBA from gas station where she reported that she slipped on the curb, "felt her thigh twist" and fell.  Denies hitting her head, denies taking blood thinners.  C/o chronic back pain. Refused pain meds from EMS.  AOx4.

## 2018-06-12 NOTE — ED Notes (Signed)
Patient transported to X-ray 

## 2018-06-12 NOTE — Consult Note (Signed)
Reason for Consult:  Left femur shaft fracture Referring Physician: EDP  [name] is an 78 y.o. female.  HPI: The patient is a very active and pleasant 78 year old female who sustained a very hard accidental mechanical fall earlier this evening landing hard on her left femur.  She had severe pain of her left thigh and inability to ambulate.  She is brought to the [facility] emergency room and found to have femur shaft fracture of the left femur.  She denies any syncopal episodes.  She denies any chest pain, shortness of breath, fever, chills, nausea, vomiting.  She has not had any recent illnesses she describes.  She denies any previous left thigh pain.  She reports only thigh pain now from this fall that she had today.  Past Medical History:  Diagnosis Date  . Acute blood loss anemia   . Acute encephalopathy 07/15/2016  . AKI (acute kidney injury) ([place])   . Anemia    has sickle cell trait  . Anxiety   . Arthritis   . Asthma    has used inhaler in past for asthmatic bronchitis, last time- early 2012  . Bilateral primary osteoarthritis of knee 03/26/2016  . Complication of anesthesia    wakes up shaking  . Diabetes mellitus   . Diabetes mellitus with neuropathy ([place]) 10/29/2012  . Diverticulitis   . Dyslipidemia   . Encephalopathy 06/2016   due to medications after surgery  . Fibromyalgia   . GERD (gastroesophageal reflux disease)    occas. use of  Prilosec  . Heart murmur    sees Dr. Montez Morita, last seen- early 2012  . Herniated nucleus pulposus, L2-3 06/25/2016  . History of back surgery   . Hypertension    02/2010- stress test /w PCP  . Hypothyroidism   . Loose bowel movements 12/2016  . Lumbar radiculopathy 06/27/2016  . Lumbar stenosis with neurogenic claudication 03/17/2017  . Memory disorder 02/16/2016  . Myoclonic jerking   . Neuromuscular disorder (HCC)    lumbar radiculopathy, lumbago  . Nocturnal leg cramps 09/27/2014  . Osteoporosis 03/10/2014  . Pneumonia   .  Sickle cell trait ([place])   . Sleep apnea    borderline sleep apnea, states she no longer uses, early 2012- stopped using   . Spinal stenosis of lumbar region with radiculopathy 02/28/2014  . Spondylolisthesis of lumbar region 03/19/2011  . Type II or unspecified type diabetes mellitus without mention of complication, uncontrolled 07/08/2013    Past Surgical History:  Procedure Laterality Date  . ABDOMINAL HYSTERECTOMY    . adb.cyst     ovarian cyst  . BACK SURGERY     2012, 2015 (3 total)  . BACK SURGERY  2018   02/24/2017  . COLONOSCOPY    . EYE SURGERY     macular degeneration treatment - injections  . OVARIAN CYST SURGERY    . POSTERIOR LUMBAR FUSION 4 LEVEL N/A 03/17/2017   Procedure: Decompression of Lumbar One-Two with Thoracic Ten to Lumbar Two Fusion;  Surgeon: Kristeen Miss, MD;  Location: [facility];  Service: Neurosurgery;  Laterality: N/A;  Decompression of L1-2 with T10 to L2 Fusion    Family History  Problem Relation Age of Onset  . Ovarian cancer Mother   . Cancer - Prostate Father   . Breast cancer Paternal Aunt   . Multiple myeloma Paternal Aunt   . Anesthesia problems Neg Hx   . Hypotension Neg Hx   . Malignant hyperthermia Neg Hx   .  Pseudochol deficiency Neg Hx     Social History:  reports that she quit smoking about 15 years ago. Her smoking use included cigarettes. She has a 3.00 pack-year smoking history. She has never used smokeless tobacco. She reports current alcohol use. She reports that she does not use drugs.  Allergies:  Allergies  Allergen Reactions  . Cymbalta [Duloxetine Hcl] Diarrhea and Other (See Comments)    Dizziness, headache, irritability  . Baclofen     jerks   . Betadine [Povidone Iodine] Swelling and Other (See Comments)    SWELLING REACTION DESCRIPTION/SEVERITY UNSPECIFIED  Reaction to betadine eye drops  . Adhesive [Tape] Rash  . Lipitor [Atorvastatin] Rash    Medications: I have reviewed the patient's current  medications.  Results for orders placed or performed during the hospital encounter of 06/12/18 (from the past 48 hour(s))  CBC with Differential     Status: Abnormal   Collection Time: 06/12/18  6:50 PM  Result Value Ref Range   WBC 7.9 4.0 - 10.5 K/uL   RBC 5.50 (H) 3.87 - 5.11 MIL/uL   Hemoglobin 14.8 12.0 - 15.0 g/dL   HCT 47.7 (H) 36.0 - 46.0 %   MCV 86.7 80.0 - 100.0 fL   MCH 26.9 26.0 - 34.0 pg   MCHC 31.0 30.0 - 36.0 g/dL   RDW 14.4 11.5 - 15.5 %   Platelets 282 150 - 400 K/uL   nRBC 0.0 0.0 - 0.2 %   Neutrophils Relative % 84 %   Neutro Abs 6.6 1.7 - 7.7 K/uL   Lymphocytes Relative 10 %   Lymphs Abs 0.8 0.7 - 4.0 K/uL   Monocytes Relative 5 %   Monocytes Absolute 0.4 0.1 - 1.0 K/uL   Eosinophils Relative 0 %   Eosinophils Absolute 0.0 0.0 - 0.5 K/uL   Basophils Relative 0 %   Basophils Absolute 0.0 0.0 - 0.1 K/uL   Immature Granulocytes 1 %   Abs Immature Granulocytes 0.07 0.00 - 0.07 K/uL    Comment: Performed at [facility], [place] [address], [place], [place] [address]  Basic metabolic panel     Status: Abnormal   Collection Time: 06/12/18  6:50 PM  Result Value Ref Range   Sodium 138 135 - 145 mmol/L   Potassium 3.8 3.5 - 5.1 mmol/L   Chloride 103 98 - 111 mmol/L   CO2 27 22 - 32 mmol/L   Glucose, Bld 121 (H) 70 - 99 mg/dL   BUN 27 (H) 8 - 23 mg/dL   Creatinine, Ser 0.98 0.44 - 1.00 mg/dL   Calcium 9.6 8.9 - 10.3 mg/dL   GFR calc non Af Amer 55 (L) >60 mL/min   GFR calc Af Amer >60 >60 mL/min   Anion gap 8 5 - 15    Comment: Performed at [facility], [place] [address], [place], [place] [address]    Dg Hip Unilat With Pelvis 2-3 Views Left  Result Date: 06/12/2018 CLINICAL DATA:  Twisted thigh, hip pain EXAM: DG HIP (WITH OR WITHOUT PELVIS) 2-3V LEFT COMPARISON:  None. FINDINGS: Postsurgical changes at the lumbosacral spine. The SI joints are non widened. The pubic symphysis and rami are intact. Both femoral heads project in  joint. Acute fracture involving the proximal shaft of the femur at the junction of the proximal and middle thirds. Mild varus angulation of distal fracture fragment. One bone with posterior and close to 1 bone with central displacement of distal fracture fragment. IMPRESSION: Acute  displaced and angulated proximal femoral shaft fracture Electronically Signed   By: Donavan Foil M.D.   On: 06/12/2018 19:20   Dg Femur Min 2 Views Left  Result Date: 06/12/2018 CLINICAL DATA:  Fall EXAM: LEFT FEMUR 2 VIEWS COMPARISON:  None. FINDINGS: Acute fracture proximal shaft of the femur at the junction of the proximal and middle thirds. Mild varus angulation of distal fracture fragment. Slightly under 1 bone with of central displacement and 1 bone with posterior displacement of distal fracture fragment. 2.9 cm of overriding. IMPRESSION: Acute displaced, angulated and overriding fracture involving the proximal shaft of the femur Electronically Signed   By: Donavan Foil M.D.   On: 06/12/2018 19:21    Review of Systems  All other systems reviewed and are negative.  Blood pressure (!) 131/98, pulse 63, temperature 97.9 F (36.6 C), resp. rate 15, height '5\' 4"'  (1.626 m), weight 83.2 kg, SpO2 100 %. Physical Exam  Constitutional: She is oriented to person, place, and time. She appears well-developed and well-nourished.  HENT:  Head: Normocephalic and atraumatic.  Eyes: Pupils are equal, round, and reactive to light.  Neck: Normal range of motion.  Cardiovascular: Normal rate.  Respiratory: Effort normal.  GI: Soft.  Musculoskeletal:     Left upper leg: She exhibits tenderness, bony tenderness, swelling, edema and deformity.  Neurological: She is alert and oriented to person, place, and time.  Skin: Skin is warm and dry.  Psychiatric: She has a normal mood and affect.   Her left foot is well-perfused.  She is able to move her toes.  She has a bounding pulse that is easily palpable in her left foot.   I  independently reviewed x-rays of the left femur and it does show an oblique fracture of the midshaft of the femur.  Her bone quality appears strong.  I see no lesions or evidence to suggest that this is a pathologic fracture.  Assessment/Plan: Left femur shaft fracture  I have spoken to the patient and her family in length and we have recommended addressing the fracture surgically with an intramedullary nail.  We talked about operative and nonoperative treatment modalities.  They fully understand that fixing the femur with an intramedullary rod is the standard of care and this will help decrease her pain and hopefully lead to early mobility and early ambulation.  I went over the x-rays with the family in detail.  We had a long and thorough discussion about the risk and benefits of this type of surgery.  We will let her eat now and have set her up for surgery for tomorrow late morning.  All question concerns were answered and addressed.  She is graciously being seen by the [facility] who will be medically optimizing her and admitting her.  [name] 06/12/2018, 8:34 PM

## 2018-06-12 NOTE — H&P (Signed)
History and Physical    [name] MVE:[id number] DOB: 1940/06/17 DOA: 06/12/2018  PCP: Wenda Low, MD  Patient coming from: home   Chief Complaint: fall  HPI: [name] is a 78 y.o. female with medical history significant for well controlled insulin dependent t2dm, osteoporosis, htn, spinal stenosis, chronic pain, persistent asthma, who presents with above.  Patient was in usual state of health today when at gas station tripped and fell, landing on left side. No head trauma, no LOC. Immediate pain and inability to ambulate ambulate afterward. Denies history of fracture. Endorses hx osteoporosis, says was treated for many years and last year was told had been treated long enough to stop medication. Former smoker, drinks occasionally.  ED Course: imaging, ortho consult  Review of Systems: As per HPI otherwise 10 point review of systems negative.    Past Medical History:  Diagnosis Date  . Acute blood loss anemia   . Acute encephalopathy 07/15/2016  . AKI (acute kidney injury) ([place])   . Anemia    has sickle cell trait  . Anxiety   . Arthritis   . Asthma    has used inhaler in past for asthmatic bronchitis, last time- early 2012  . Bilateral primary osteoarthritis of knee 03/26/2016  . Complication of anesthesia    wakes up shaking  . Diabetes mellitus   . Diabetes mellitus with neuropathy ([place]) 10/29/2012  . Diverticulitis   . Dyslipidemia   . Encephalopathy 06/2016   due to medications after surgery  . Fibromyalgia   . GERD (gastroesophageal reflux disease)    occas. use of  Prilosec  . Heart murmur    sees Dr. Montez Morita, last seen- early 2012  . Herniated nucleus pulposus, L2-3 06/25/2016  . History of back surgery   . Hypertension    02/2010- stress test /w PCP  . Hypothyroidism   . Loose bowel movements 12/2016  . Lumbar radiculopathy 06/27/2016  . Lumbar stenosis with neurogenic claudication 03/17/2017  . Memory disorder 02/16/2016  . Myoclonic jerking    . Neuromuscular disorder (HCC)    lumbar radiculopathy, lumbago  . Nocturnal leg cramps 09/27/2014  . Osteoporosis 03/10/2014  . Pneumonia   . Sickle cell trait ([place])   . Sleep apnea    borderline sleep apnea, states she no longer uses, early 2012- stopped using   . Spinal stenosis of lumbar region with radiculopathy 02/28/2014  . Spondylolisthesis of lumbar region 03/19/2011  . Type II or unspecified type diabetes mellitus without mention of complication, uncontrolled 07/08/2013    Past Surgical History:  Procedure Laterality Date  . ABDOMINAL HYSTERECTOMY    . adb.cyst     ovarian cyst  . BACK SURGERY     2012, 2015 (3 total)  . BACK SURGERY  2018   02/24/2017  . COLONOSCOPY    . EYE SURGERY     macular degeneration treatment - injections  . OVARIAN CYST SURGERY    . POSTERIOR LUMBAR FUSION 4 LEVEL N/A 03/17/2017   Procedure: Decompression of Lumbar One-Two with Thoracic Ten to Lumbar Two Fusion;  Surgeon: Kristeen Miss, MD;  Location: [facility];  Service: Neurosurgery;  Laterality: N/A;  Decompression of L1-2 with T10 to L2 Fusion     reports that she quit smoking about 15 years ago. Her smoking use included cigarettes. She has a 3.00 pack-year smoking history. She has never used smokeless tobacco. She reports current alcohol use. She reports that she does not use drugs.  Allergies  Allergen Reactions  . Cymbalta [Duloxetine Hcl] Diarrhea and Other (See Comments)    Dizziness, headache, irritability  . Baclofen     jerks   . Betadine [Povidone Iodine] Swelling and Other (See Comments)    SWELLING REACTION DESCRIPTION/SEVERITY UNSPECIFIED  Reaction to betadine eye drops  . Adhesive [Tape] Rash  . Lipitor [Atorvastatin] Rash    Family History  Problem Relation Age of Onset  . Ovarian cancer Mother   . Cancer - Prostate Father   . Breast cancer Paternal Aunt   . Multiple myeloma Paternal Aunt   . Anesthesia problems Neg Hx   . Hypotension Neg Hx   . Malignant  hyperthermia Neg Hx   . Pseudochol deficiency Neg Hx     Prior to Admission medications   Medication Sig Start Date End Date Taking? Authorizing Provider  aspirin 81 MG EC tablet Take 81 mg by mouth daily.  01/16/15  Yes [provider]  dapagliflozin propanediol (FARXIGA) 5 MG TABS tablet Take 5 mg by mouth daily. 04/30/18  Yes [name], MD  diphenhydrAMINE (BENADRYL) 25 MG tablet Take 25 mg by mouth every 6 (six) hours as needed for allergies.   Yes [provider]  fluticasone (FLONASE) 50 MCG/ACT nasal spray Place 1 spray into both nostrils daily as needed for allergies.    Yes [provider]  gabapentin (NEURONTIN) 600 MG tablet Take 1 tablet (600 mg total) by mouth 3 (three) times daily. 01/15/18  Yes Kathrynn Ducking, MD  ibuprofen (ADVIL,MOTRIN) 800 MG tablet Take 800 mg every 8 (eight) hours as needed by mouth for mild pain or moderate pain.  08/09/16  Yes [provider]  irbesartan (AVAPRO) 300 MG tablet Take 1 tablet (300 mg total) by mouth daily. 08/18/17  Yes [name], MD  levothyroxine (SYNTHROID, LEVOTHROID) 50 MCG tablet Take 1 tablet (50 mcg total) by mouth daily. 06/11/18  Yes [name], MD  MAGNESIUM PO Take 250 mg by mouth 2 (two) times daily.    Yes [provider]  meloxicam (MOBIC) 15 MG tablet Take 15 mg by mouth daily.  04/16/18  Yes [provider]  NOVOLOG FLEXPEN 100 UNIT/ML FlexPen INJECT 6 UNITS UNDER THE SKIN BEFORE BREAKFAST, 10 UNITS BEFORE LUNCH, AND 9 UNITS BEFORE SUPPER Patient taking differently: Inject 6-10 Units into the skin 3 (three) times daily with meals.  03/23/18  Yes [name], MD  potassium chloride (K-DUR,KLOR-CON) 10 MEQ tablet Take 10 mEq by mouth 2 (two) times daily.  06/11/18  Yes [provider]  predniSONE (STERAPRED UNI-PAK 21 TAB) 10 MG (21) TBPK tablet Take 10-60 mg by mouth as directed. Day 1: Take 6 tablets (60 mg)  Day 2: Take 5 tablets (50 mg) Day 3: Take 4 tablets (40  mg) Day 4: Take 3 tablets (30 mg) Day 5: Take 2 tablets (20 mg) Day 6: Take 1 tablet (10 mg) 06/09/18  Yes [provider]  PROAIR RESPICLICK 937 (90 Base) MCG/ACT AEPB Inhale 2 puffs into the lungs 3 (three) times daily as needed (shortness of breath).  05/29/16  Yes [provider]  TRESIBA FLEXTOUCH 100 UNIT/ML SOPN FlexTouch Pen INJECT 24 UNITS UNDER THE SKIN ONCE DAILY Patient taking differently: Inject 24 Units into the skin every morning.  08/20/17  Yes [name], MD  acetaminophen (TYLENOL) 500 MG tablet Take 1,000 mg by mouth every 8 (eight) hours as needed for mild pain or moderate pain.     [provider]  BD PEN NEEDLE NANO U/F 32G X 4 MM MISC USE AS DIRECTED 04/06/18   [name], MD  cetirizine (ZYRTEC) 10 MG tablet Take 10 mg by mouth daily as needed for allergies.     [provider]  Colesevelam HCl 3.75 g PACK Dissolve 1 packet in unsweetened beverage and take daily with lunch Patient not taking: Reported on 06/12/2018 01/26/18   [name], MD  glucose blood (ONE TOUCH ULTRA TEST) test strip Use as instructed to check blood sugar daily. 06/11/18   [name], MD  potassium chloride (K-DUR,KLOR-CON) 10 MEQ tablet Take 1 tablet (10 mEq total) by mouth once for 1 dose. 06/11/18 06/11/18  [name], MD  pregabalin (LYRICA) 50 MG capsule Take 50 mg by mouth daily as needed (pain).     [provider]  SYMBICORT 80-4.5 MCG/ACT inhaler Inhale 2 puffs into the lungs 2 (two) times daily as needed (shortness of breath).  05/29/16   [provider]    Physical Exam: Vitals:   06/12/18 1729 06/12/18 1730 06/12/18 1935 06/12/18 1942  BP:  (!) 162/86 (!) 131/98   Pulse:  64 63   Resp:   15   Temp:   97.9 F (36.6 C)   TempSrc:      SpO2: 99% 98% 100%   Weight:    83.2 kg  Height:    '5\' 4"'  (1.626 m)    Constitutional: No acute distress Head: Atraumatic Eyes: Conjunctiva clear ENM: Moist mucous membranes. Normal dentition.    Neck: Supple Respiratory: Clear to auscultation bilaterally, no wheezing/rales/rhonchi. Normal respiratory effort. No accessory muscle use. . Cardiovascular: Regular rate and rhythm. Mild systolic murmur Abdomen: Non-tender, non-distended. No masses. No rebound or guarding. Positive bowel sounds. Musculoskeletal: left thigh deformity Skin: No rashes, lesions, or ulcers.  Extremities: No peripheral edema. Palpable peripheral pulses. Neurologic: Alert, moving all 4 extremities. LE distal sensation to light touch intact b/l Psychiatric: Normal insight and judgement.   Labs on Admission: I have personally reviewed following labs and imaging studies  CBC: Recent Labs  Lab 06/12/18 1850  WBC 7.9  NEUTROABS 6.6  HGB 14.8  HCT 47.7*  MCV 86.7  PLT 591   Basic Metabolic Panel: Recent Labs  Lab 06/09/18 1054 06/12/18 1850  NA 139 138  K 3.5 3.8  CL 104 103  CO2 26 27  GLUCOSE 129* 121*  BUN 16 27*  CREATININE 1.02 0.98  CALCIUM 9.6 9.6   GFR: Estimated Creatinine Clearance: 49.4 mL/min (by C-G formula based on SCr of 0.98 mg/dL). Liver Function Tests: No results for input(s): AST, ALT, ALKPHOS, BILITOT, PROT, ALBUMIN in the last 168 hours. No results for input(s): LIPASE, AMYLASE in the last 168 hours. No results for input(s): AMMONIA in the last 168 hours. Coagulation Profile: No results for input(s): INR, PROTIME in the last 168 hours. Cardiac Enzymes: No results for input(s): CKTOTAL, CKMB, CKMBINDEX, TROPONINI in the last 168 hours. BNP (last 3 results) No results for input(s): PROBNP in the last 8760 hours. HbA1C: No results for input(s): HGBA1C in the last 72 hours. CBG: No results for input(s): GLUCAP in the last 168 hours. Lipid Profile: No results for input(s): CHOL, HDL, LDLCALC, TRIG, CHOLHDL, LDLDIRECT in the last 72 hours. Thyroid Function Tests: No results for input(s): TSH, T4TOTAL, FREET4, T3FREE, THYROIDAB in the last 72 hours. Anemia Panel: No  results for input(s): VITAMINB12, FOLATE, FERRITIN, TIBC, IRON, RETICCTPCT in the last 72 hours. Urine analysis:    Component Value  Date/Time   COLORURINE STRAW (A) 07/15/2016 0023   APPEARANCEUR CLEAR 07/15/2016 0023   LABSPEC 1.009 07/15/2016 0023   PHURINE 5.0 07/15/2016 0023   GLUCOSEU NEGATIVE 07/15/2016 0023   GLUCOSEU 250 (A) 05/30/2014 1623   HGBUR NEGATIVE 07/15/2016 0023   BILIRUBINUR NEGATIVE 07/15/2016 0023   KETONESUR NEGATIVE 07/15/2016 0023   PROTEINUR NEGATIVE 07/15/2016 0023   UROBILINOGEN 0.2 05/30/2014 1623   NITRITE NEGATIVE 07/15/2016 0023   LEUKOCYTESUR NEGATIVE 07/15/2016 0023    Radiological Exams on Admission: Dg Hip Unilat With Pelvis 2-3 Views Left  Result Date: 06/12/2018 CLINICAL DATA:  Twisted thigh, hip pain EXAM: DG HIP (WITH OR WITHOUT PELVIS) 2-3V LEFT COMPARISON:  None. FINDINGS: Postsurgical changes at the lumbosacral spine. The SI joints are non widened. The pubic symphysis and rami are intact. Both femoral heads project in joint. Acute fracture involving the proximal shaft of the femur at the junction of the proximal and middle thirds. Mild varus angulation of distal fracture fragment. One bone with posterior and close to 1 bone with central displacement of distal fracture fragment. IMPRESSION: Acute displaced and angulated proximal femoral shaft fracture Electronically Signed   By: Donavan Foil M.D.   On: 06/12/2018 19:20   Dg Femur Min 2 Views Left  Result Date: 06/12/2018 CLINICAL DATA:  Fall EXAM: LEFT FEMUR 2 VIEWS COMPARISON:  None. FINDINGS: Acute fracture proximal shaft of the femur at the junction of the proximal and middle thirds. Mild varus angulation of distal fracture fragment. Slightly under 1 bone with of central displacement and 1 bone with posterior displacement of distal fracture fragment. 2.9 cm of overriding. IMPRESSION: Acute displaced, angulated and overriding fracture involving the proximal shaft of the femur Electronically  Signed   By: Donavan Foil M.D.   On: 06/12/2018 19:21    EKG: Independently reviewed. Sinus bradycardia  Assessment/Plan Principal Problem:   Fracture, proximal femur (HCC) Active Problems:   Diabetes mellitus with neuropathy (HCC)   Essential hypertension, benign   Osteoporosis   Hypothyroidism   Spinal stenosis of lumbar region   Asthma   Femur fracture, left ([place])   # Left femur fracture - proximal shaft, displaced and angulated. Neurovascularly intact. Has been seen by ortho, plan for OR tomorrow. - [place] traction ordered per ortho request - npo after midnight, fluids to start at midnight - type and screen, pt/ptt - dilaudid prn for pain control - SCDs for now  # T2DM - well controlled, a1c last month 7.3. Here glucose wnl. - hold home farxiga, SSI, tresiba - start SSI  # HTN - here bp wnl - cont home irbesartan, hold asa  # Asthma - asymptomatic - cont home albuterol prn, substitute dulera for home symbicort  # Hypothyroid - cont home levothyroxine  # Chronic pain - cont home gabapentin, hold meloxicam  DVT prophylaxis: SCDs for now Code Status: full  Family Communication: patricia fitzgerald [phone]  Disposition Plan: tbd  Consults called: Alexis Goodell  Admission status: med/surg    Desma Maxim MD [facility] Pager [phone]  If 7PM-7AM, please contact night-coverage [email or web address] Password [email or web address]  06/12/2018, 8:30 PM

## 2018-06-12 NOTE — ED Notes (Signed)
1745 attempted IV start x2, unsuccessful.  [place] IV placement requested.

## 2018-06-12 NOTE — ED Notes (Signed)
Bed: YN18 Expected date:  Expected time:  Means of arrival:  Comments: EMS-fall/femur defromity

## 2018-06-12 NOTE — ED Notes (Signed)
Report to unit attempted at this time.  Receiving RN in a room at this time.  To call back in 5 min

## 2018-06-12 NOTE — ED Notes (Signed)
ED TO INPATIENT HANDOFF REPORT  Name/Age/Gender [name] 78 y.o. female  Code Status Code Status History    Date Active Date Inactive Code Status Order ID Comments User Context   03/18/2017 0006 03/21/2017 1542 Full Code [id number]  Kristeen Miss, MD Inpatient   07/15/2016 0319 07/19/2016 1618 Full Code [id number]  Rise Patience, MD ED   06/27/2016 1848 07/03/2016 1442 Full Code [id number]  Cathlyn Parsons, PA-C Inpatient   06/27/2016 1848 06/27/2016 1848 Full Code [id number]  Cathlyn Parsons, PA-C Inpatient   06/25/2016 1948 06/27/2016 1819 Full Code [id number]  Kristeen Miss, MD Inpatient   02/28/2014 1929 03/04/2014 1405 Full Code [id number]  Kristeen Miss, MD Inpatient   03/19/2011 1849 03/25/2011 1747 Full Code [id number]  Minor, Genia Del, RN Inpatient    Advance Directive Documentation     Most Recent Value  Type of Advance Directive  Healthcare Power of Attorney, Living will  Pre-existing out of facility DNR order (yellow form or pink MOST form)  -  "MOST" Form in Place?  -      Home/SNF/Other Home  Chief Complaint fall   Level of Care/Admitting Diagnosis ED Disposition    ED Disposition Condition [place]: [facility] [[id number]]  Level of Care: Med-Surg [16]  Diagnosis: Femur fracture, left [facility]) [[id number]]  Admitting Physician: Eston Esters  Attending Physician: Gwynne Edinger [[id number]]  Estimated length of stay: past midnight tomorrow  Certification:: I certify this patient will need inpatient services for at least 2 midnights  PT Class (Do Not Modify): Inpatient [101]  PT Acc Code (Do Not Modify): Private [1]       Medical History Past Medical History:  Diagnosis Date  . Acute blood loss anemia   . Acute encephalopathy 07/15/2016  . AKI (acute kidney injury) ([place])   . Anemia    has sickle cell trait  . Anxiety   . Arthritis   . Asthma    has used inhaler in past for asthmatic bronchitis, last  time- early 2012  . Bilateral primary osteoarthritis of knee 03/26/2016  . Complication of anesthesia    wakes up shaking  . Diabetes mellitus   . Diabetes mellitus with neuropathy ([place]) 10/29/2012  . Diverticulitis   . Dyslipidemia   . Encephalopathy 06/2016   due to medications after surgery  . Fibromyalgia   . GERD (gastroesophageal reflux disease)    occas. use of  Prilosec  . Heart murmur    sees Dr. Montez Morita, last seen- early 2012  . Herniated nucleus pulposus, L2-3 06/25/2016  . History of back surgery   . Hypertension    02/2010- stress test /w PCP  . Hypothyroidism   . Loose bowel movements 12/2016  . Lumbar radiculopathy 06/27/2016  . Lumbar stenosis with neurogenic claudication 03/17/2017  . Memory disorder 02/16/2016  . Myoclonic jerking   . Neuromuscular disorder (HCC)    lumbar radiculopathy, lumbago  . Nocturnal leg cramps 09/27/2014  . Osteoporosis 03/10/2014  . Pneumonia   . Sickle cell trait ([place])   . Sleep apnea    borderline sleep apnea, states she no longer uses, early 2012- stopped using   . Spinal stenosis of lumbar region with radiculopathy 02/28/2014  . Spondylolisthesis of lumbar region 03/19/2011  . Type II or unspecified type diabetes mellitus without mention of complication, uncontrolled 07/08/2013    Allergies Allergies  Allergen Reactions  . Cymbalta [Duloxetine Hcl] Diarrhea and Other (See Comments)  Dizziness, headache, irritability  . Baclofen     jerks   . Betadine [Povidone Iodine] Swelling and Other (See Comments)    SWELLING REACTION DESCRIPTION/SEVERITY UNSPECIFIED  Reaction to betadine eye drops  . Adhesive [Tape] Rash  . Lipitor [Atorvastatin] Rash    IV Location/Drains/Wounds Patient Lines/Drains/Airways Status   Active Line/Drains/Airways    Name:   Placement date:   Placement time:   Site:   Days:   Peripheral IV 06/12/18 Right;Upper Arm   06/12/18    1857    Arm   less than 1   Incision (Closed) 06/25/16 Back Other (Comment)    06/25/16    1723     717   Incision (Closed) 03/17/17 Back Other (Comment)   03/17/17    1800     452          Labs/Imaging Results for orders placed or performed during the hospital encounter of 06/12/18 (from the past 48 hour(s))  CBC with Differential     Status: Abnormal   Collection Time: 06/12/18  6:50 PM  Result Value Ref Range   WBC 7.9 4.0 - 10.5 K/uL   RBC 5.50 (H) 3.87 - 5.11 MIL/uL   Hemoglobin 14.8 12.0 - 15.0 g/dL   HCT 47.7 (H) 36.0 - 46.0 %   MCV 86.7 80.0 - 100.0 fL   MCH 26.9 26.0 - 34.0 pg   MCHC 31.0 30.0 - 36.0 g/dL   RDW 14.4 11.5 - 15.5 %   Platelets 282 150 - 400 K/uL   nRBC 0.0 0.0 - 0.2 %   Neutrophils Relative % 84 %   Neutro Abs 6.6 1.7 - 7.7 K/uL   Lymphocytes Relative 10 %   Lymphs Abs 0.8 0.7 - 4.0 K/uL   Monocytes Relative 5 %   Monocytes Absolute 0.4 0.1 - 1.0 K/uL   Eosinophils Relative 0 %   Eosinophils Absolute 0.0 0.0 - 0.5 K/uL   Basophils Relative 0 %   Basophils Absolute 0.0 0.0 - 0.1 K/uL   Immature Granulocytes 1 %   Abs Immature Granulocytes 0.07 0.00 - 0.07 K/uL    Comment: Performed at [facility], [place] [address], [place], [place] [address]  Basic metabolic panel     Status: Abnormal   Collection Time: 06/12/18  6:50 PM  Result Value Ref Range   Sodium 138 135 - 145 mmol/L   Potassium 3.8 3.5 - 5.1 mmol/L   Chloride 103 98 - 111 mmol/L   CO2 27 22 - 32 mmol/L   Glucose, Bld 121 (H) 70 - 99 mg/dL   BUN 27 (H) 8 - 23 mg/dL   Creatinine, Ser 0.98 0.44 - 1.00 mg/dL   Calcium 9.6 8.9 - 10.3 mg/dL   GFR calc non Af Amer 55 (L) >60 mL/min   GFR calc Af Amer >60 >60 mL/min   Anion gap 8 5 - 15    Comment: Performed at [facility], [place] [address], [place], [place] [address]   Dg Hip Unilat With Pelvis 2-3 Views Left  Result Date: 06/12/2018 CLINICAL DATA:  Twisted thigh, hip pain EXAM: DG HIP (WITH OR WITHOUT PELVIS) 2-3V LEFT COMPARISON:  None. FINDINGS: Postsurgical changes at the  lumbosacral spine. The SI joints are non widened. The pubic symphysis and rami are intact. Both femoral heads project in joint. Acute fracture involving the proximal shaft of the femur at the junction of the proximal and middle thirds. Mild varus angulation of distal fracture  fragment. One bone with posterior and close to 1 bone with central displacement of distal fracture fragment. IMPRESSION: Acute displaced and angulated proximal femoral shaft fracture Electronically Signed   By: Donavan Foil M.D.   On: 06/12/2018 19:20   Dg Femur Min 2 Views Left  Result Date: 06/12/2018 CLINICAL DATA:  Fall EXAM: LEFT FEMUR 2 VIEWS COMPARISON:  None. FINDINGS: Acute fracture proximal shaft of the femur at the junction of the proximal and middle thirds. Mild varus angulation of distal fracture fragment. Slightly under 1 bone with of central displacement and 1 bone with posterior displacement of distal fracture fragment. 2.9 cm of overriding. IMPRESSION: Acute displaced, angulated and overriding fracture involving the proximal shaft of the femur Electronically Signed   By: Donavan Foil M.D.   On: 06/12/2018 19:21    Pending Labs [employer] (From admission, onward)    Start     Ordered   Signed and Held  Protime-INR  Tomorrow morning,   R     Signed and Held   Signed and Held  Type and screen [place]  Once,   R    Comments:  [place]    Signed and Held          Vitals/Pain Today's Vitals   06/12/18 2034 06/12/18 2038 06/12/18 2100 06/12/18 2103  BP:    (!) 134/96  Pulse: (!) 56  92 86  Resp:    14  Temp:    98.2 F (36.8 C)  TempSrc:      SpO2: 98%  99% 99%  Weight:      Height:      PainSc:  8       Isolation Precautions No active isolations  Medications Medications  fentaNYL (SUBLIMAZE) injection 50 mcg (50 mcg Intravenous Given 06/12/18 1859)  HYDROmorphone (DILAUDID) injection 1 mg (1 mg Intravenous Given 06/12/18 1939)     Mobility walks with device

## 2018-06-13 ENCOUNTER — Encounter (HOSPITAL_COMMUNITY): Payer: Self-pay | Admitting: Anesthesiology

## 2018-06-13 ENCOUNTER — Inpatient Hospital Stay (HOSPITAL_COMMUNITY): Payer: Medicare Other | Admitting: Certified Registered Nurse Anesthetist

## 2018-06-13 ENCOUNTER — Inpatient Hospital Stay (HOSPITAL_COMMUNITY): Payer: Medicare Other

## 2018-06-13 ENCOUNTER — Encounter (HOSPITAL_COMMUNITY)
Admission: EM | Disposition: A | Discharge status: Skilled Nursing Facility | Payer: Self-pay | Source: Home / Self Care | Attending: Student

## 2018-06-13 DIAGNOSIS — M810 Age-related osteoporosis without current pathological fracture: Secondary | ICD-10-CM

## 2018-06-13 DIAGNOSIS — S72352A Displaced comminuted fracture of shaft of left femur, initial encounter for closed fracture: Secondary | ICD-10-CM

## 2018-06-13 DIAGNOSIS — E1140 Type 2 diabetes mellitus with diabetic neuropathy, unspecified: Secondary | ICD-10-CM

## 2018-06-13 DIAGNOSIS — E039 Hypothyroidism, unspecified: Secondary | ICD-10-CM

## 2018-06-13 DIAGNOSIS — Z794 Long term (current) use of insulin: Secondary | ICD-10-CM

## 2018-06-13 DIAGNOSIS — S72002A Fracture of unspecified part of neck of left femur, initial encounter for closed fracture: Secondary | ICD-10-CM

## 2018-06-13 HISTORY — PX: FEMUR IM NAIL: SHX1597

## 2018-06-13 LAB — PROTIME-INR
INR: 0.98
Prothrombin Time: 12.9 seconds (ref 11.4–15.2)

## 2018-06-13 LAB — GLUCOSE, CAPILLARY
GLUCOSE-CAPILLARY: 237 mg/dL — AB (ref 70–99)
Glucose-Capillary: 133 mg/dL — ABNORMAL HIGH (ref 70–99)
Glucose-Capillary: 140 mg/dL — ABNORMAL HIGH (ref 70–99)
Glucose-Capillary: 184 mg/dL — ABNORMAL HIGH (ref 70–99)

## 2018-06-13 LAB — TYPE AND SCREEN
ABO/RH(D): B POS
ANTIBODY SCREEN: NEGATIVE

## 2018-06-13 LAB — SURGICAL PCR SCREEN
MRSA, PCR: NEGATIVE
Staphylococcus aureus: NEGATIVE

## 2018-06-13 LAB — ABO/RH: ABO/RH(D): B POS

## 2018-06-13 SURGERY — INSERTION, INTRAMEDULLARY ROD, FEMUR
Anesthesia: General | Site: Hip | Laterality: Left

## 2018-06-13 MED ORDER — METHOCARBAMOL 500 MG IVPB - SIMPLE MED
500.0000 mg | Freq: Four times a day (QID) | INTRAVENOUS | Status: DC | PRN
  Administered 2018-06-13: 500 mg via INTRAVENOUS
  Filled 2018-06-13: qty 50

## 2018-06-13 MED ORDER — ONDANSETRON HCL 4 MG PO TABS: 4.0000 mg | ORAL_TABLET | Freq: Four times a day (QID) | ORAL | Status: DC | PRN

## 2018-06-13 MED ORDER — SUGAMMADEX SODIUM 200 MG/2ML IV SOLN
INTRAVENOUS | Status: AC
  Filled 2018-06-13: qty 2

## 2018-06-13 MED ORDER — METHOCARBAMOL 500 MG PO TABS
500.0000 mg | ORAL_TABLET | Freq: Four times a day (QID) | ORAL | Status: DC | PRN
  Administered 2018-06-13 – 2018-06-15 (×3): 500 mg via ORAL
  Filled 2018-06-13: qty 1

## 2018-06-13 MED ORDER — MENTHOL 3 MG MT LOZG: 1.0000 | LOZENGE | OROMUCOSAL | Status: DC | PRN

## 2018-06-13 MED ORDER — LACTATED RINGERS IV SOLN: INTRAVENOUS | Status: DC

## 2018-06-13 MED ORDER — ACETAMINOPHEN 10 MG/ML IV SOLN
INTRAVENOUS | Status: DC | PRN
  Administered 2018-06-13: 1000 mg via INTRAVENOUS

## 2018-06-13 MED ORDER — ALUM & MAG HYDROXIDE-SIMETH 200-200-20 MG/5ML PO SUSP: 30.0000 mL | ORAL | Status: DC | PRN

## 2018-06-13 MED ORDER — ONDANSETRON HCL 4 MG/2ML IJ SOLN
4.0000 mg | Freq: Four times a day (QID) | INTRAMUSCULAR | Status: DC | PRN
  Administered 2018-06-14: 4 mg via INTRAVENOUS
  Filled 2018-06-13: qty 2

## 2018-06-13 MED ORDER — CEFAZOLIN SODIUM-DEXTROSE 2-3 GM-%(50ML) IV SOLR
INTRAVENOUS | Status: DC | PRN
  Administered 2018-06-13: 2 g via INTRAVENOUS

## 2018-06-13 MED ORDER — METHOCARBAMOL 500 MG IVPB - SIMPLE MED
INTRAVENOUS | Status: AC
  Filled 2018-06-13: qty 50

## 2018-06-13 MED ORDER — EPHEDRINE 5 MG/ML INJ
INTRAVENOUS | Status: AC
  Filled 2018-06-13: qty 10

## 2018-06-13 MED ORDER — FENTANYL CITRATE (PF) 100 MCG/2ML IJ SOLN
INTRAMUSCULAR | Status: AC
  Filled 2018-06-13: qty 2

## 2018-06-13 MED ORDER — PROPOFOL 10 MG/ML IV BOLUS
INTRAVENOUS | Status: DC | PRN
  Administered 2018-06-13: 140 mg via INTRAVENOUS

## 2018-06-13 MED ORDER — ONDANSETRON HCL 4 MG/2ML IJ SOLN
INTRAMUSCULAR | Status: AC
  Filled 2018-06-13: qty 2

## 2018-06-13 MED ORDER — CEFAZOLIN SODIUM-DEXTROSE 2-4 GM/100ML-% IV SOLN
2.0000 g | Freq: Four times a day (QID) | INTRAVENOUS | Status: AC
  Administered 2018-06-13 (×2): 2 g via INTRAVENOUS
  Filled 2018-06-13 (×2): qty 100

## 2018-06-13 MED ORDER — EPHEDRINE SULFATE-NACL 50-0.9 MG/10ML-% IV SOSY
PREFILLED_SYRINGE | INTRAVENOUS | Status: DC | PRN
  Administered 2018-06-13 (×2): 10 mg via INTRAVENOUS

## 2018-06-13 MED ORDER — PHENOL 1.4 % MT LIQD: 1.0000 | OROMUCOSAL | Status: DC | PRN

## 2018-06-13 MED ORDER — PROPOFOL 10 MG/ML IV BOLUS
INTRAVENOUS | Status: AC
  Filled 2018-06-13: qty 20

## 2018-06-13 MED ORDER — HYDROMORPHONE HCL 1 MG/ML IJ SOLN
0.5000 mg | INTRAMUSCULAR | Status: DC | PRN
  Administered 2018-06-13: 0.5 mg via INTRAVENOUS
  Administered 2018-06-14: 1 mg via INTRAVENOUS
  Filled 2018-06-13 (×2): qty 1

## 2018-06-13 MED ORDER — CEFAZOLIN SODIUM-DEXTROSE 2-4 GM/100ML-% IV SOLN
INTRAVENOUS | Status: AC
  Filled 2018-06-13: qty 100

## 2018-06-13 MED ORDER — SUGAMMADEX SODIUM 200 MG/2ML IV SOLN
INTRAVENOUS | Status: DC | PRN
  Administered 2018-06-13: 150 mg via INTRAVENOUS

## 2018-06-13 MED ORDER — OXYCODONE HCL 5 MG PO TABS
5.0000 mg | ORAL_TABLET | ORAL | Status: DC | PRN
  Administered 2018-06-13: 5 mg via ORAL
  Filled 2018-06-13 (×2): qty 1
  Filled 2018-06-13: qty 2

## 2018-06-13 MED ORDER — DEXAMETHASONE SODIUM PHOSPHATE 10 MG/ML IJ SOLN
INTRAMUSCULAR | Status: AC
  Filled 2018-06-13: qty 1

## 2018-06-13 MED ORDER — LIDOCAINE 2% (20 MG/ML) 5 ML SYRINGE
INTRAMUSCULAR | Status: DC | PRN
  Administered 2018-06-13: 50 mg via INTRAVENOUS

## 2018-06-13 MED ORDER — METOCLOPRAMIDE HCL 5 MG PO TABS: 5.0000 mg | ORAL_TABLET | Freq: Three times a day (TID) | ORAL | Status: DC | PRN

## 2018-06-13 MED ORDER — MEPERIDINE HCL 50 MG/ML IJ SOLN: 6.2500 mg | INTRAMUSCULAR | Status: DC | PRN

## 2018-06-13 MED ORDER — FENTANYL CITRATE (PF) 100 MCG/2ML IJ SOLN
INTRAMUSCULAR | Status: DC | PRN
  Administered 2018-06-13 (×4): 50 ug via INTRAVENOUS

## 2018-06-13 MED ORDER — METOCLOPRAMIDE HCL 5 MG/ML IJ SOLN: 5.0000 mg | Freq: Three times a day (TID) | INTRAMUSCULAR | Status: DC | PRN

## 2018-06-13 MED ORDER — ACETAMINOPHEN 325 MG PO TABS
325.0000 mg | ORAL_TABLET | Freq: Four times a day (QID) | ORAL | Status: DC | PRN
  Administered 2018-06-13 – 2018-06-15 (×2): 650 mg via ORAL
  Filled 2018-06-13 (×2): qty 2

## 2018-06-13 MED ORDER — 0.9 % SODIUM CHLORIDE (POUR BTL) OPTIME
TOPICAL | Status: DC | PRN
  Administered 2018-06-13: 1000 mL

## 2018-06-13 MED ORDER — LACTATED RINGERS IV SOLN
INTRAVENOUS | Status: DC | PRN
  Administered 2018-06-13: 10:00:00 via INTRAVENOUS

## 2018-06-13 MED ORDER — FENTANYL CITRATE (PF) 100 MCG/2ML IJ SOLN
25.0000 ug | INTRAMUSCULAR | Status: DC | PRN
  Administered 2018-06-13 (×4): 25 ug via INTRAVENOUS

## 2018-06-13 MED ORDER — ACETAMINOPHEN 10 MG/ML IV SOLN
INTRAVENOUS | Status: AC
  Filled 2018-06-13: qty 100

## 2018-06-13 MED ORDER — PANTOPRAZOLE SODIUM 40 MG PO TBEC
40.0000 mg | DELAYED_RELEASE_TABLET | Freq: Every day | ORAL | Status: DC
  Administered 2018-06-13 – 2018-06-15 (×3): 40 mg via ORAL
  Filled 2018-06-13 (×3): qty 1

## 2018-06-13 MED ORDER — DOCUSATE SODIUM 100 MG PO CAPS
100.0000 mg | ORAL_CAPSULE | Freq: Two times a day (BID) | ORAL | Status: DC
  Administered 2018-06-13 – 2018-06-14 (×2): 100 mg via ORAL
  Filled 2018-06-13 (×2): qty 1

## 2018-06-13 MED ORDER — ROCURONIUM BROMIDE 10 MG/ML (PF) SYRINGE
PREFILLED_SYRINGE | INTRAVENOUS | Status: DC | PRN
  Administered 2018-06-13: 40 mg via INTRAVENOUS

## 2018-06-13 MED ORDER — OXYCODONE HCL 5 MG PO TABS
10.0000 mg | ORAL_TABLET | ORAL | Status: DC | PRN
  Administered 2018-06-13 (×2): 15 mg via ORAL
  Administered 2018-06-14: 10 mg via ORAL
  Administered 2018-06-14 (×3): 15 mg via ORAL
  Administered 2018-06-15: 10 mg via ORAL
  Administered 2018-06-15: 15 mg via ORAL
  Filled 2018-06-13: qty 2
  Filled 2018-06-13 (×5): qty 3
  Filled 2018-06-13: qty 2
  Filled 2018-06-13: qty 3

## 2018-06-13 MED ORDER — ONDANSETRON HCL 4 MG/2ML IJ SOLN
INTRAMUSCULAR | Status: DC | PRN
  Administered 2018-06-13: 4 mg via INTRAVENOUS

## 2018-06-13 MED ORDER — PROMETHAZINE HCL 25 MG/ML IJ SOLN: 6.2500 mg | INTRAMUSCULAR | Status: DC | PRN

## 2018-06-13 MED ORDER — DEXAMETHASONE SODIUM PHOSPHATE 10 MG/ML IJ SOLN
INTRAMUSCULAR | Status: DC | PRN
  Administered 2018-06-13: 4 mg via INTRAVENOUS

## 2018-06-13 MED ORDER — ASPIRIN EC 325 MG PO TBEC
325.0000 mg | DELAYED_RELEASE_TABLET | Freq: Every day | ORAL | Status: DC
  Administered 2018-06-14 – 2018-06-15 (×2): 325 mg via ORAL
  Filled 2018-06-13 (×2): qty 1

## 2018-06-13 SURGICAL SUPPLY — 45 items
BIT DRILL AO GAMMA 4.2X180 (BIT) ×2 IMPLANT
BIT DRILL AO GAMMA 4.2X340 (BIT) ×1 IMPLANT
BNDG CMPR MED 10X6 ELC LF (GAUZE/BANDAGES/DRESSINGS) ×1
BNDG ELASTIC 6X10 VLCR STRL LF (GAUZE/BANDAGES/DRESSINGS) ×2 IMPLANT
BNDG GAUZE ELAST 4 BULKY (GAUZE/BANDAGES/DRESSINGS) ×2 IMPLANT
COVER PERINEAL POST (MISCELLANEOUS) ×2 IMPLANT
COVER WAND RF STERILE (DRAPES) IMPLANT
DRAPE C-ARMOR (DRAPES) ×1 IMPLANT
DRAPE STERI IOBAN 125X83 (DRAPES) ×1 IMPLANT
DRESSING AQUACEL AG SP 3.5X4 (GAUZE/BANDAGES/DRESSINGS) IMPLANT
DRSG AQUACEL AG ADV 3.5X 4 (GAUZE/BANDAGES/DRESSINGS) ×4 IMPLANT
DRSG AQUACEL AG ADV 3.5X 6 (GAUZE/BANDAGES/DRESSINGS) ×1 IMPLANT
DRSG AQUACEL AG SP 3.5X4 (GAUZE/BANDAGES/DRESSINGS) ×2
DURAPREP 26ML APPLICATOR (WOUND CARE) ×2 IMPLANT
ELECT REM PT RETURN 15FT ADLT (MISCELLANEOUS) ×2 IMPLANT
FACESHIELD WRAPAROUND (MASK) ×4 IMPLANT
FACESHIELD WRAPAROUND OR TEAM (MASK) ×2 IMPLANT
GAUZE XEROFORM 1X8 LF (GAUZE/BANDAGES/DRESSINGS) ×2 IMPLANT
GLOVE BIOGEL PI IND STRL 8 (GLOVE) ×2 IMPLANT
GLOVE BIOGEL PI INDICATOR 8 (GLOVE) ×2
GLOVE ECLIPSE 8.0 STRL XLNG CF (GLOVE) ×2 IMPLANT
GLOVE ORTHO TXT STRL SZ7.5 (GLOVE) ×2 IMPLANT
GOWN STRL REUS W/ TWL XL LVL3 (GOWN DISPOSABLE) ×2 IMPLANT
GOWN STRL REUS W/TWL XL LVL3 (GOWN DISPOSABLE) ×4
GUIDEROD T2 3X1000 (ROD) ×1 IMPLANT
GUIDEWIRE GAMMA (WIRE) ×1 IMPLANT
KIT BASIN OR (CUSTOM PROCEDURE TRAY) ×2 IMPLANT
MANIFOLD NEPTUNE II (INSTRUMENTS) ×2 IMPLANT
NAIL T2 RECON RT 5 HIP 09X380 (Miscellaneous) IMPLANT
NS IRRIG 1000ML POUR BTL (IV SOLUTION) ×2 IMPLANT
PACK GENERAL/GYN (CUSTOM PROCEDURE TRAY) ×2 IMPLANT
PROTECTOR NERVE ULNAR (MISCELLANEOUS) ×6 IMPLANT
REAMER SHAFT BIXCUT (INSTRUMENTS) ×1 IMPLANT
SCREW LOCKING FULL THREAD 5X52 (Screw) ×1 IMPLANT
SCREW LOCKING T2 F/T  5MMX45MM (Screw) ×1 IMPLANT
SCREW LOCKING T2 F/T  5X42.5MM (Screw) ×1 IMPLANT
SCREW LOCKING T2 F/T 5MMX45MM (Screw) IMPLANT
SCREW LOCKING T2 F/T 5X42.5MM (Screw) IMPLANT
STAPLER VISISTAT 35W (STAPLE) ×1 IMPLANT
SUT VIC AB 0 CT1 27 (SUTURE) ×2
SUT VIC AB 0 CT1 27XBRD ANTBC (SUTURE) ×1 IMPLANT
SUT VIC AB 2-0 CT1 27 (SUTURE) ×2
SUT VIC AB 2-0 CT1 TAPERPNT 27 (SUTURE) ×1 IMPLANT
T2 RECON NAIL RT 5 HIP 09X380 (Miscellaneous) ×2 IMPLANT
TOWEL OR 17X26 10 PK STRL BLUE (TOWEL DISPOSABLE) ×2 IMPLANT

## 2018-06-13 NOTE — Anesthesia Procedure Notes (Signed)
Procedure Name: Intubation Date/Time: 06/13/2018 10:44 AM Performed by: Victoriano Lain, CRNA Pre-anesthesia Checklist: Patient identified, Emergency Drugs available, Suction available, Patient being monitored and Timeout performed Patient Re-evaluated:Patient Re-evaluated prior to induction Oxygen Delivery Method: Circle system utilized Preoxygenation: Pre-oxygenation with 100% oxygen Induction Type: IV induction Ventilation: Mask ventilation without difficulty Laryngoscope Size: Mac and 4 Grade View: Grade I Tube type: Oral Tube size: 7.5 mm Number of attempts: 1 Airway Equipment and Method: Stylet Placement Confirmation: ETT inserted through vocal cords under direct vision,  positive ETCO2 and breath sounds checked- equal and bilateral Secured at: 21 cm Tube secured with: Tape Dental Injury: Teeth and Oropharynx as per pre-operative assessment

## 2018-06-13 NOTE — Op Note (Signed)
[name], [name] MEDICAL RECORD [id number] ACCOUNT [id number] DATE OF BIRTH:[birth year] FACILITY: [facility] LOCATION: [facility] PHYSICIAN:CHRISTOPHER Kerry Fort, MD  OPERATIVE REPORT  DATE OF PROCEDURE:  06/13/2018  PREOPERATIVE DIAGNOSIS:  Left oblique midshaft femur fracture.  POSTOPERATIVE DIAGNOSIS:  Left oblique midshaft femur fracture.  PROCEDURE:  Antegrade intramedullary nail placement, left femur.  IMPLANTS:  Stryker 9 x 380 antegrade femoral nail with 1 proximal and 2 distal interlocking screws.  SURGEON:  Lind Guest. Ninfa Linden, MD  ANESTHESIA:  General.  ANTIBIOTICS:  Two grams IV Ancef.  ESTIMATED BLOOD LOSS:  250 mL.  COMPLICATIONS:  None.  INDICATIONS:  The patient is a very pleasant 78 year old female with no previous left thigh or hip pain who sustained an accidental hard mechanical fall yesterday when she slipped on a wet surface.  She actually landed more on her thigh awkwardly, and  she had the inability to ambulate and severe left thigh pain.  She was brought via EMS to the [facility] Emergency Room where she was found to have a displaced midshaft femur fracture.  She was graciously admitted to the medicine service and now  presents today for definitive fixation of the femur.  I talked to her and her family at length about the injury and about surgery and surgical options.  This is going to be definitely treated surgically based on the nature of the fracture and  displacement of this.  We talked about the risk of acute blood loss anemia, nerve or vessel injury, continued fracture, malunion, nonunion as well as infection and DVT.  We talked about the goals of decreased pain, improved mobility and overall improved  quality of life.  DESCRIPTION OF PROCEDURE:  After informed consent was obtained and appropriate left thigh was marked, she was brought to the operating room and general anesthesia was obtained while she was on her stretcher.  She was then placed  supine on the fracture  table with a perineal post in place, her left operative leg in in-line skeletal traction, and her right operative leg in a hip abduction stirrup with appropriate padding of the popliteal area of the leg.  We then assessed her fracture under direct  fluoroscopy, and I was able to pull traction and lock the table down, and we were able to get in line near anatomic.  We then prepped her left hip, thigh, knee and leg with ChloraPrep and sterile drapes.  A time-out was called, and she was identified as  correct patient, correct left hip.  We then made an incision just proximal to the greater trochanter and dissected down to the tip of greater trochanter.  Under direct fluoroscopic guidance, a temporary guide pin was placed in an antegrade fashion to  open up the proximal femur.  We then used initiating reamer to open up the femoral canal and placed a temporary long guide pin in an antegrade fashion down the femoral canal across the fracture, holding it reduced down into the knee.  Again, this  placement was assessed under direct fluoroscopy as well.  We then took a measurement off of this, and we knew for lengthwise we ordered a 380 femoral nail.  We then began reaming in 5 mm increments from a size 9 reamer all the way up to a size 11.  With  the size 11 showing excellent chatter, we chose then a 9 x 380 femoral nail.  We passed that nail down the femoral canal across the guidewire and the fracture without difficulty, and then  we removed the guidewire.  Through a separate outrigger guide, we  then put a slightly oblique screw proximally from the greater trochanter down to the lesser trochanter.  Distally, we placed 2 interlocking screws through separate small stab incision from lateral to medial.  I was pleased with the fracture reduction  overall.  We did let some traction off the [place] table as we were reducing the fracture as well once the rod had gotten down, and we felt like we  anatomically reduced this and rotation was on as well.  We then irrigated all the wounds with normal saline  solution and closed the deep tissue with 0 Vicryl followed by 2-0 Vicryl subcutaneous tissue and interrupted staples on all the skin incisions.  A well-padded sterile dressing was applied.  She was taken off the fracture table, awakened, extubated, and  taken to recovery room in stable condition.  All final counts were correct.  There were no complications noted.  LN/NUANCE  D:06/13/2018 T:06/13/2018 JOB:[id number]

## 2018-06-13 NOTE — Transfer of Care (Signed)
Immediate Anesthesia Transfer of Care Note  Patient: [name]  Procedure(s) Performed: INTRAMEDULLARY (IM) NAIL FEMORAL (Left Hip)  Patient Location: PACU  Anesthesia Type:General  Level of Consciousness: awake, alert , oriented and patient cooperative  Airway & Oxygen Therapy: Patient Spontanous Breathing and Patient connected to face mask oxygen  Post-op Assessment: Report given to RN, Post -op Vital signs reviewed and stable and Patient moving all extremities  Post vital signs: Reviewed and stable  Last Vitals:  Vitals Value Taken Time  BP 141/80 06/13/2018 12:34 PM  Temp    Pulse 74 06/13/2018 12:36 PM  Resp 12 06/13/2018 12:36 PM  SpO2 100 % 06/13/2018 12:36 PM  Vitals shown include unvalidated device data.  Last Pain:  Vitals:   06/13/18 0858  TempSrc:   PainSc: 6       Patients Stated Pain Goal: 3 (00/34/91 7915)  Complications: No apparent anesthesia complications

## 2018-06-13 NOTE — Brief Op Note (Signed)
06/13/2018  12:37 PM  PATIENT:  [name]  78 y.o. female  PRE-OPERATIVE DIAGNOSIS:  left femoral shaft fracture  POST-OPERATIVE DIAGNOSIS:  left femoral shaft fracture  PROCEDURE:  Procedure(s): INTRAMEDULLARY (IM) NAIL FEMORAL (Left)  SURGEON:  Surgeon(s) and Role:    [name], MD - Primary  ANESTHESIA:   general  EBL:  250 mL   COUNTS:  YES  TOURNIQUET:  * No tourniquets in log *  DICTATION: .Other Dictation: Dictation Number [id number]  PLAN OF CARE: Admit to inpatient   PATIENT DISPOSITION:  PACU - hemodynamically stable.   Delay start of Pharmacological VTE agent (>24hrs) due to surgical blood loss or risk of bleeding: no

## 2018-06-13 NOTE — Progress Notes (Signed)
PROGRESS NOTE  VADIS SLABACH GQQ:[id number] DOB: 20-Feb-1941 DOA: 06/12/2018 PCP: Wenda Low, MD   LOS: 1 day   Brief Narrative / Interim history: [name] is a 78 y.o. female with medical history significant for well controlled insulin dependent t2dm, osteoporosis, htn, spinal stenosis, chronic pain, persistent asthma, who presents after mechanical fall and subsequent left femoral fracture as noted on femoral and hip x-ray.  Orthopedic surgery consulted and planning to take patient to the OR on 2/22.  Subjective: No complaints this morning.  Left leg pain when she moves.  Denies numbness or tingling.  No significant cardiopulmonary history.  Denies chest pain, dyspnea, palpitation, nausea, vomiting or abdominal pain  Assessment & Plan: Principal Problem:   Fracture, proximal femur ([place]) Active Problems:   Diabetes mellitus with neuropathy ([place])   Essential hypertension, benign   Osteoporosis   Hypothyroidism   Spinal stenosis of lumbar region   Asthma   Femur fracture, left (HCC)  Mechanical fall Left femoral fracture -Femoral and hip x-ray showed acute displaced, angulated and overriding fracture involving the proximal shaft of left femur -OR today.  No significant cardiopulmonary history.  Diabetes fairly controlled -Dilaudid as needed for pain control -SCD for VTE prophylaxis -Continue n.p.o.  Well-controlled IDDM-2: A1c 7.3% last months.  CBG fairly controlled -SSI and CBG monitoring -Hold home [place]  Hypertension: BP fairly controlled -Continue home medications  Asthma: No pulmonary symptoms -PRN albuterol -Dulera in place of home Symbicort  Hypothyroidism: -Continue home Synthroid  Chronic pain: Stable -Continue home gabapentin -Dilaudid as above  Scheduled Meds: . [MAR Hold] gabapentin  600 mg Oral TID  . [MAR Hold] insulin aspart  0-15 Units Subcutaneous TID WC  . [MAR Hold] insulin aspart  0-5 Units Subcutaneous QHS  . [MAR  Hold] irbesartan  300 mg Oral Daily  . [MAR Hold] levothyroxine  50 mcg Oral QAC breakfast  . [MAR Hold] mometasone-formoterol  2 puff Inhalation BID   Continuous Infusions: . ceFAZolin    . dextrose 5 % and 0.45 % NaCl with KCl 10 mEq/L 100 mL/hr at 06/13/18 0530  . lactated ringers     PRN Meds:.0.9 % irrigation (POUR BTL), [MAR Hold] albuterol, [MAR Hold]  HYDROmorphone (DILAUDID) injection, [MAR Hold] methocarbamol  DVT prophylaxis: SCD for now Code Status: Full code Family Communication: None at bedside Disposition Plan: Remains inpatient  Consultants:   Orthopedic surgery  Procedures:   Plan for femoral fixation with intramedullary rods on 2/22 by Dr. Ninfa Linden  Antimicrobials:  Cefazolin perioperatively  Objective: Vitals:   06/13/18 0150 06/13/18 0516 06/13/18 0844 06/13/18 0858  BP: (!) 155/77 (!) 144/81 (!) 149/78   Pulse: 67 63 63   Resp:   20   Temp: (!) 97.3 F (36.3 C) 98.5 F (36.9 C) 97.9 F (36.6 C)   TempSrc: Oral Oral Oral   SpO2: 97% 99% 97% 95%  Weight:      Height:        Intake/Output Summary (Last 24 hours) at 06/13/2018 1215 Last data filed at 06/13/2018 1213 Gross per 24 hour  Intake 1091.58 ml  Output 1200 ml  Net -108.42 ml   Filed Weights   06/12/18 1942  Weight: 83.2 kg    Examination:  GENERAL: Appears well. No acute distress.  EYES - vision grossly intact. Sclera anicteric.  NOSE- no gross deformity or drainage MOUTH - no oral lesions noted THROAT- no swelling or erythema LUNGS:  No IWOB. Good air movement. CTAB.  HEART:  RRR.  Heart sounds normal.  No murmurs. ABD: Bowel sounds present. Soft. Non tender.  MSK/EXT: Some lower extremity discrepancy with left shorter than right.  Traction in place but not helping as the bed was lowered.  Neurovascularily intact. SKIN: no apparent skin lesion.  NEURO: Awake, alert and oriented appropriately.  No gross deficit.  PSYCH: Calm. Normal affect.   Data Reviewed: I have  independently reviewed following labs and imaging studies  CBC: Recent Labs  Lab 06/12/18 1850  WBC 7.9  NEUTROABS 6.6  HGB 14.8  HCT 47.7*  MCV 86.7  PLT 253   Basic Metabolic Panel: Recent Labs  Lab 06/09/18 1054 06/12/18 1850  NA 139 138  K 3.5 3.8  CL 104 103  CO2 26 27  GLUCOSE 129* 121*  BUN 16 27*  CREATININE 1.02 0.98  CALCIUM 9.6 9.6   GFR: Estimated Creatinine Clearance: 49.4 mL/min (by C-G formula based on SCr of 0.98 mg/dL). Liver Function Tests: No results for input(s): AST, ALT, ALKPHOS, BILITOT, PROT, ALBUMIN in the last 168 hours. No results for input(s): LIPASE, AMYLASE in the last 168 hours. No results for input(s): AMMONIA in the last 168 hours. Coagulation Profile: Recent Labs  Lab 06/13/18 0404  INR 0.98   Cardiac Enzymes: No results for input(s): CKTOTAL, CKMB, CKMBINDEX, TROPONINI in the last 168 hours. BNP (last 3 results) No results for input(s): PROBNP in the last 8760 hours. HbA1C: No results for input(s): HGBA1C in the last 72 hours. CBG: Recent Labs  Lab 06/12/18 2220 06/13/18 0848  GLUCAP 128* 133*   Lipid Profile: No results for input(s): CHOL, HDL, LDLCALC, TRIG, CHOLHDL, LDLDIRECT in the last 72 hours. Thyroid Function Tests: No results for input(s): TSH, T4TOTAL, FREET4, T3FREE, THYROIDAB in the last 72 hours. Anemia Panel: No results for input(s): VITAMINB12, FOLATE, FERRITIN, TIBC, IRON, RETICCTPCT in the last 72 hours. Urine analysis:    Component Value Date/Time   COLORURINE STRAW (A) 07/15/2016 0023   APPEARANCEUR CLEAR 07/15/2016 0023   LABSPEC 1.009 07/15/2016 0023   PHURINE 5.0 07/15/2016 0023   GLUCOSEU NEGATIVE 07/15/2016 0023   GLUCOSEU 250 (A) 05/30/2014 1623   HGBUR NEGATIVE 07/15/2016 0023   BILIRUBINUR NEGATIVE 07/15/2016 0023   KETONESUR NEGATIVE 07/15/2016 0023   PROTEINUR NEGATIVE 07/15/2016 0023   UROBILINOGEN 0.2 05/30/2014 1623   NITRITE NEGATIVE 07/15/2016 0023   LEUKOCYTESUR NEGATIVE  07/15/2016 0023   Sepsis Labs: Invalid input(s): PROCALCITONIN, LACTICIDVEN  Recent Results (from the past 240 hour(s))  Surgical pcr screen     Status: None   Collection Time: 06/13/18 12:37 AM  Result Value Ref Range Status   MRSA, PCR NEGATIVE NEGATIVE Final   Staphylococcus aureus NEGATIVE NEGATIVE Final    Comment: (NOTE) The Xpert SA Assay (FDA approved for NASAL specimens in patients 41 years of age and older), is one component of a comprehensive surveillance program. It is not intended to diagnose infection nor to guide or monitor treatment. Performed at [facility], [place] [address], [place], [place] [address]       Radiology Studies: Dg Hip Unilat With Pelvis 2-3 Views Left  Result Date: 06/12/2018 CLINICAL DATA:  Twisted thigh, hip pain EXAM: DG HIP (WITH OR WITHOUT PELVIS) 2-3V LEFT COMPARISON:  None. FINDINGS: Postsurgical changes at the lumbosacral spine. The SI joints are non widened. The pubic symphysis and rami are intact. Both femoral heads project in joint. Acute fracture involving the proximal shaft of the femur at the junction of the proximal  and middle thirds. Mild varus angulation of distal fracture fragment. One bone with posterior and close to 1 bone with central displacement of distal fracture fragment. IMPRESSION: Acute displaced and angulated proximal femoral shaft fracture Electronically Signed   By: Donavan Foil M.D.   On: 06/12/2018 19:20   Dg Femur Min 2 Views Left  Result Date: 06/12/2018 CLINICAL DATA:  Fall EXAM: LEFT FEMUR 2 VIEWS COMPARISON:  None. FINDINGS: Acute fracture proximal shaft of the femur at the junction of the proximal and middle thirds. Mild varus angulation of distal fracture fragment. Slightly under 1 bone with of central displacement and 1 bone with posterior displacement of distal fracture fragment. 2.9 cm of overriding. IMPRESSION: Acute displaced, angulated and overriding fracture involving the proximal shaft of  the femur Electronically Signed   By: Donavan Foil M.D.   On: 06/12/2018 19:21      [place] [facility] Pager [phone]  If 7PM-7AM, please contact night-coverage [email or web address] Password [email or web address] 06/13/2018, 12:15 PM

## 2018-06-13 NOTE — Progress Notes (Signed)
Pt down to pacu in stable condition via bed. No needs at time of transfer. [place] traction removed prior to transfer.

## 2018-06-13 NOTE — Progress Notes (Signed)
Patient ID: [name], female   DOB: December 09, 1940, 78 y.o.   MRN: [id number] The patient understands fully that she has a left femur shaft fracture and we are recommending surgery.  The risks and benefits have been discussed in detail and informed consent is obtained.

## 2018-06-13 NOTE — Anesthesia Preprocedure Evaluation (Addendum)
Anesthesia Evaluation  Patient identified by MRN, date of birth, ID band Patient awake    Reviewed: Allergy & Precautions, NPO status , Patient's Chart, lab work & pertinent test results  Airway Mallampati: I  TM Distance: >3 FB Neck ROM: Full    Dental  (+) Teeth Intact, Dental Advisory Given   Pulmonary asthma , sleep apnea , former smoker,    breath sounds clear to auscultation       Cardiovascular hypertension, Pt. on medications  Rhythm:Regular Rate:Normal     Neuro/Psych Anxiety    GI/Hepatic Neg liver ROS, GERD  ,  Endo/Other  diabetesHypothyroidism   Renal/GU      Musculoskeletal  (+) Arthritis , Fibromyalgia -  Abdominal Normal abdominal exam  (+)   Peds  Hematology  (+) Sickle cell trait ,   Anesthesia Other Findings   Reproductive/Obstetrics                            Lab Results  Component Value Date   WBC 7.9 06/12/2018   HGB 14.8 06/12/2018   HCT 47.7 (H) 06/12/2018   MCV 86.7 06/12/2018   PLT 282 06/12/2018   Lab Results  Component Value Date   CREATININE 0.98 06/12/2018   BUN 27 (H) 06/12/2018   NA 138 06/12/2018   K 3.8 06/12/2018   CL 103 06/12/2018   CO2 27 06/12/2018   Lab Results  Component Value Date   INR 0.98 06/13/2018   INR 1.00 11/20/2010   EKG: normal sinus rhythm.  Anesthesia Physical Anesthesia Plan  ASA: III  Anesthesia Plan: General   Post-op Pain Management:    Induction: Intravenous  PONV Risk Score and Plan: 4 or greater and Ondansetron, Dexamethasone, Midazolam and Treatment may vary due to age or medical condition  Airway Management Planned: Oral ETT  Additional Equipment: None  Intra-op Plan:   Post-operative Plan: Extubation in OR  Informed Consent: I have reviewed the patients History and Physical, chart, labs and discussed the procedure including the risks, benefits and alternatives for the proposed anesthesia with  the patient or authorized representative who has indicated his/her understanding and acceptance.       Plan Discussed with: CRNA  Anesthesia Plan Comments:       Anesthesia Quick Evaluation

## 2018-06-13 NOTE — Anesthesia Postprocedure Evaluation (Signed)
Anesthesia Post Note  Patient: [name]  Procedure(s) Performed: INTRAMEDULLARY (IM) NAIL FEMORAL (Left Hip)     Patient location during evaluation: PACU Anesthesia Type: General Level of consciousness: awake and alert Pain management: pain level controlled Vital Signs Assessment: post-procedure vital signs reviewed and stable Respiratory status: spontaneous breathing, nonlabored ventilation, respiratory function stable and patient connected to nasal cannula oxygen Cardiovascular status: blood pressure returned to baseline and stable Postop Assessment: no apparent nausea or vomiting Anesthetic complications: no    Last Vitals:  Vitals:   06/13/18 1330 06/13/18 1350  BP: 117/64 116/68  Pulse: 65 (!) 59  Resp: 13   Temp: (!) 36.4 C (!) 36.4 C  SpO2: 96% 96%    Last Pain:  Vitals:   06/13/18 1350  TempSrc:   PainSc: [place]

## 2018-06-14 DIAGNOSIS — N179 Acute kidney failure, unspecified: Secondary | ICD-10-CM

## 2018-06-14 LAB — BASIC METABOLIC PANEL
ANION GAP: 7 (ref 5–15)
BUN: 29 mg/dL — ABNORMAL HIGH (ref 8–23)
CO2: 23 mmol/L (ref 22–32)
Calcium: 7.8 mg/dL — ABNORMAL LOW (ref 8.9–10.3)
Chloride: 107 mmol/L (ref 98–111)
Creatinine, Ser: 1.4 mg/dL — ABNORMAL HIGH (ref 0.44–1.00)
GFR calc Af Amer: 42 mL/min — ABNORMAL LOW (ref >60)
GFR calc non Af Amer: 36 mL/min — ABNORMAL LOW (ref >60)
Glucose, Bld: 269 mg/dL — ABNORMAL HIGH (ref 70–99)
Potassium: 4.1 mmol/L (ref 3.5–5.1)
Sodium: 137 mmol/L (ref 135–145)

## 2018-06-14 LAB — CBC
HCT: 32.9 % — ABNORMAL LOW (ref 36.0–46.0)
HEMOGLOBIN: 9.8 g/dL — AB (ref 12.0–15.0)
MCH: 26.9 pg (ref 26.0–34.0)
MCHC: 29.8 g/dL — ABNORMAL LOW (ref 30.0–36.0)
MCV: 90.4 fL (ref 80.0–100.0)
Platelets: 211 10*3/uL (ref 150–400)
RBC: 3.64 MIL/uL — ABNORMAL LOW (ref 3.87–5.11)
RDW: 14.6 % (ref 11.5–15.5)
WBC: 6.9 10*3/uL (ref 4.0–10.5)
nRBC: 0 % (ref 0.0–0.2)

## 2018-06-14 LAB — GLUCOSE, CAPILLARY
Glucose-Capillary: 175 mg/dL — ABNORMAL HIGH (ref 70–99)
Glucose-Capillary: 167 mg/dL — ABNORMAL HIGH (ref 70–99)
Glucose-Capillary: 232 mg/dL — ABNORMAL HIGH (ref 70–99)
Glucose-Capillary: 150 mg/dL — ABNORMAL HIGH (ref 70–99)

## 2018-06-14 MED ORDER — SENNOSIDES-DOCUSATE SODIUM 8.6-50 MG PO TABS
1.0000 | ORAL_TABLET | Freq: Two times a day (BID) | ORAL | Status: DC | PRN
  Filled 2018-06-14: qty 1

## 2018-06-14 MED ORDER — SODIUM CHLORIDE 0.9 % IV SOLN: INTRAVENOUS | Status: AC

## 2018-06-14 MED ORDER — AMLODIPINE BESYLATE 10 MG PO TABS
10.0000 mg | ORAL_TABLET | Freq: Every day | ORAL | Status: DC
  Administered 2018-06-14 – 2018-06-15 (×2): 10 mg via ORAL
  Filled 2018-06-14 (×2): qty 1

## 2018-06-14 NOTE — Progress Notes (Signed)
PROGRESS NOTE  [name] DOB: [birth year] DOA: 06/12/2018 PCP: Wenda Low, MD   LOS: 2 days   Brief Narrative / Interim history: [name] is a 78 y.o. female with medical history significant for well controlled insulin dependent t2dm, osteoporosis, htn, spinal stenosis, chronic pain, persistent asthma, who presents after mechanical fall and subsequent left femoral fracture as noted on femoral and hip x-ray.  She had antegrade intramedullary nail placement on 2/22.  Subjective: No major events overnight of this morning.  No complaint this morning.  Pain fairly controlled.  Tolerating diet.  Reports passing gas but no bowel movement yet.  Requesting Senokot.  No urinary symptoms.  Denies chest pain, dyspnea or abdominal pain.  Assessment & Plan: Principal Problem:   Fracture, proximal femur ([place]) Active Problems:   Diabetes mellitus with neuropathy ([place])   Essential hypertension, benign   Osteoporosis   Hypothyroidism   Spinal stenosis of lumbar region   Asthma   Femur fracture, left (HCC)  Mechanical fall Left femoral fracture -Femoral and hip x-ray showed acute displaced, angulated and overriding fracture involving the proximal shaft of left femur -Status post antegrade intramedullary nail placement on 2/22 by Dr. Ninfa Linden -Pain management DVT prophylaxis per orthopedic surgery (aspirin 325 g daily) -Senokot  AKI: serum creatinine trended up from 0.98-1.41 this morning.  On high-dose aspirin on a irbesartan. -Hold irbesartan.  -Add amlodipine for blood pressure -We will recheck renal function in the morning.  Well-controlled IDDM-2: A1c 7.3% last months.  CBG fairly controlled -SSI and CBG monitoring -Hold home [place]  Hypertension: BP fairly controlled -BP medications as above.  Asthma: No pulmonary symptoms -PRN albuterol -Dulera in place of home Symbicort  Hypothyroidism: -Continue home Synthroid  Scheduled Meds: .  amLODipine  10 mg Oral Daily  . aspirin EC  325 mg Oral Q breakfast  . gabapentin  600 mg Oral TID  . insulin aspart  0-15 Units Subcutaneous TID WC  . insulin aspart  0-5 Units Subcutaneous QHS  . levothyroxine  50 mcg Oral QAC breakfast  . mometasone-formoterol  2 puff Inhalation BID  . pantoprazole  40 mg Oral Daily   Continuous Infusions: . sodium chloride    . methocarbamol (ROBAXIN) IV Stopped (06/13/18 1432)   PRN Meds:.acetaminophen, albuterol, alum & mag hydroxide-simeth, HYDROmorphone (DILAUDID) injection, menthol-cetylpyridinium **OR** phenol, methocarbamol **OR** methocarbamol (ROBAXIN) IV, methocarbamol, metoCLOPramide **OR** metoCLOPramide (REGLAN) injection, ondansetron **OR** ondansetron (ZOFRAN) IV, oxyCODONE, oxyCODONE, senna-docusate  DVT prophylaxis: SCD for now Code Status: Full code Family Communication: None at bedside Disposition Plan: Remains inpatient  Consultants:   Orthopedic surgery  Procedures:   Plan for femoral fixation with intramedullary rods on 2/22 by Dr. Ninfa Linden  Antimicrobials:  Cefazolin perioperatively  Objective: Vitals:   06/13/18 2129 06/14/18 0103 06/14/18 0517 06/14/18 0729  BP:  114/60 104/64   Pulse:  71 70   Resp:  16 14   Temp:  98 F (36.7 C) 98 F (36.7 C)   TempSrc:  Oral Oral   SpO2: 97% 100%  93%  Weight:      Height:        Intake/Output Summary (Last 24 hours) at 06/14/2018 0944 Last data filed at 06/14/2018 0941 Gross per 24 hour  Intake 5224.11 ml  Output 1900 ml  Net 3324.11 ml   Filed Weights   06/12/18 1942  Weight: 83.2 kg    Examination:  GENERAL: Appears well. No acute distress.  HEENT: MMM.  Vision and Hearing grossly  intact.  NECK: Supple.  LUNGS:  No IWOB. Good air movement. CTAB.  HEART:  RRR. Heart sounds normal.  ABD: Bowel sounds present. Soft. Non tender.  EXT:   no edema bilaterally.  Moving extremities.  Symmetric pulses. SKIN: no apparent skin lesion.  NEURO: Awake, alert and  oriented appropriately.  No gross deficit.  PSYCH: Calm. Normal affect.  Data Reviewed: I have independently reviewed following labs and imaging studies  CBC: Recent Labs  Lab 06/12/18 1850 06/14/18 0428  WBC 7.9 6.9  NEUTROABS 6.6  --   HGB 14.8 9.8*  HCT 47.7* 32.9*  MCV 86.7 90.4  PLT 282 161   Basic Metabolic Panel: Recent Labs  Lab 06/09/18 1054 06/12/18 1850 06/14/18 0428  NA 139 138 137  K 3.5 3.8 4.1  CL 104 103 107  CO2 26 27 23   GLUCOSE 129* 121* 269*  BUN 16 27* 29*  CREATININE 1.02 0.98 1.40*  CALCIUM 9.6 9.6 7.8*   GFR: Estimated Creatinine Clearance: 34.6 mL/min (A) (by C-G formula based on SCr of 1.4 mg/dL (H)). Liver Function Tests: No results for input(s): AST, ALT, ALKPHOS, BILITOT, PROT, ALBUMIN in the last 168 hours. No results for input(s): LIPASE, AMYLASE in the last 168 hours. No results for input(s): AMMONIA in the last 168 hours. Coagulation Profile: Recent Labs  Lab 06/13/18 0404  INR 0.98   Cardiac Enzymes: No results for input(s): CKTOTAL, CKMB, CKMBINDEX, TROPONINI in the last 168 hours. BNP (last 3 results) No results for input(s): PROBNP in the last 8760 hours. HbA1C: No results for input(s): HGBA1C in the last 72 hours. CBG: Recent Labs  Lab 06/13/18 0848 06/13/18 1240 06/13/18 1847 06/13/18 2218 06/14/18 0804  GLUCAP 133* 140* 237* 184* 175*   Lipid Profile: No results for input(s): CHOL, HDL, LDLCALC, TRIG, CHOLHDL, LDLDIRECT in the last 72 hours. Thyroid Function Tests: No results for input(s): TSH, T4TOTAL, FREET4, T3FREE, THYROIDAB in the last 72 hours. Anemia Panel: No results for input(s): VITAMINB12, FOLATE, FERRITIN, TIBC, IRON, RETICCTPCT in the last 72 hours. Urine analysis:    Component Value Date/Time   COLORURINE STRAW (A) 07/15/2016 0023   APPEARANCEUR CLEAR 07/15/2016 0023   LABSPEC 1.009 07/15/2016 0023   PHURINE 5.0 07/15/2016 0023   GLUCOSEU NEGATIVE 07/15/2016 0023   GLUCOSEU 250 (A)  05/30/2014 1623   HGBUR NEGATIVE 07/15/2016 0023   BILIRUBINUR NEGATIVE 07/15/2016 0023   KETONESUR NEGATIVE 07/15/2016 0023   PROTEINUR NEGATIVE 07/15/2016 0023   UROBILINOGEN 0.2 05/30/2014 1623   NITRITE NEGATIVE 07/15/2016 0023   LEUKOCYTESUR NEGATIVE 07/15/2016 0023   Sepsis Labs: Invalid input(s): PROCALCITONIN, LACTICIDVEN  Recent Results (from the past 240 hour(s))  Surgical pcr screen     Status: None   Collection Time: 06/13/18 12:37 AM  Result Value Ref Range Status   MRSA, PCR NEGATIVE NEGATIVE Final   Staphylococcus aureus NEGATIVE NEGATIVE Final    Comment: (NOTE) The Xpert SA Assay (FDA approved for NASAL specimens in patients 69 years of age and older), is one component of a comprehensive surveillance program. It is not intended to diagnose infection nor to guide or monitor treatment. Performed at [facility], [place] [address] [place], [place] [address]       Radiology Studies: Dg C-arm 1-60 Min-no Report  Result Date: 06/13/2018 Fluoroscopy was utilized by the requesting physician.  No radiographic interpretation.   Dg Hip Operative Unilat W Or W/o Pelvis Left  Result Date: 06/13/2018 CLINICAL DATA:  Left femur ORIF. EXAM:  OPERATIVE LEFT  HIP (WITH PELVIS IF PERFORMED) TECHNIQUE: Fluoroscopic spot image(s) were submitted for interpretation post-operatively. FLUOROSCOPY TIME:  2 minutes, 9 seconds. C-arm fluoroscopic images were obtained intraoperatively and submitted for post operative interpretation. COMPARISON:  Left femur x-rays from yesterday. FINDINGS: Multiple intraoperative fluoroscopic images demonstrate interval intramedullary nailing of the left femur, with the proximal diaphyseal fracture now in anatomic alignment. IMPRESSION: Intraoperative fluoroscopic guidance for left femur ORIF. Electronically Signed   By: Titus Dubin M.D.   On: 06/13/2018 14:27    [facility] Pager [phone]  If 7PM-7AM, please  contact night-coverage [email or web address] Password [facility] 06/14/2018, 9:44 AM

## 2018-06-14 NOTE — Evaluation (Signed)
Physical Therapy Evaluation Patient Details Name: [name] MRN: [id number] DOB: 1940-12-21 Today's Date: 06/14/2018   History of Present Illness  78 y.o. female with medical history significant for well controlled insulin dependent t2dm, osteoporosis, htn, spinal stenosis, chronic pain, persistent asthma, who presents with after mechanical fall at gas station, landed on left side. Pt s/p left femur antegrade intramedullary nail placement on 06/13/18  Clinical Impression  Patient is s/p above surgery resulting in functional limitations due to the deficits listed below (see PT Problem List).  Patient will benefit from skilled PT to increase their independence and safety with mobility to allow discharge to the venue listed below.  Pt requiring assist for ambulating and only tolerated short distance.  Pt denies dizziness, only c/o fatigue and weakness limiting ambulation.  Recommend d/c to SNF.     Follow Up Recommendations SNF    Equipment Recommendations  Rolling walker with 5" wheels    Recommendations for Other Services       Precautions / Restrictions Precautions Precautions: Fall Restrictions Weight Bearing Restrictions: No LLE Weight Bearing: Weight bearing as tolerated      Mobility  Bed Mobility               General bed mobility comments: pt OOB by OT  Transfers Overall transfer level: Needs assistance Equipment used: Rolling walker (2 wheeled) Transfers: Sit to/from Stand Sit to Stand: Mod assist;+2 physical assistance;+2 safety/equipment         General transfer comment: verbal cues for UE and LE positioning; pt required assist to rise and steady as well as control descent  Ambulation/Gait Ambulation/Gait assistance: Mod assist;+2 safety/equipment;+2 physical assistance Gait Distance (Feet): 8 Feet Assistive device: Rolling walker (2 wheeled) Gait Pattern/deviations: Step-to pattern;Antalgic;Decreased stance time - left     General Gait Details:  verbal cues for sequence, RW positioning, step length, pt required 3 standing breaks to "shake off" arms due to UE fatigue using RW; recliner following closely for safety  Stairs            Wheelchair Mobility    Modified Rankin (Stroke Patients Only)       Balance           Standing balance support: Bilateral upper extremity supported;During functional activity Standing balance-Leahy Scale: Poor                               Pertinent Vitals/Pain Pain Assessment: 0-10 Pain Score: 7  Pain Location: R thigh Pain Descriptors / Indicators: Sore;Aching;Tender Pain Intervention(s): Limited activity within patient's tolerance;Monitored during session;Repositioned;Ice applied    Home Living Family/patient expects to be discharged to:: Private residence Living Arrangements: Alone Available Help at Discharge: Family;Friend(s);Available PRN/intermittently Type of Home: House Home Access: Stairs to enter Entrance Stairs-Rails: Right Entrance Stairs-Number of Steps: 2 Home Layout: Two level;Able to live on main level with bedroom/bathroom Home Equipment: Walker - 4 wheels;Bedside commode;Shower seat;Grab bars - toilet;Grab bars - tub/shower;Hand held shower head;Adaptive equipment      Prior Function Level of Independence: Independent with assistive device(s)         Comments: rollator in the house; drives     Hand Dominance   Dominant Hand: Right    Extremity/Trunk Assessment        Lower Extremity Assessment Lower Extremity Assessment: LLE deficits/detail LLE Deficits / Details: hip grossly 2+/5 throughout; fair quad contraction, able to perform ankle pumps  Communication   Communication: No difficulties  Cognition Arousal/Alertness: Awake/alert Behavior During Therapy: WFL for tasks assessed/performed Overall Cognitive Status: Within Functional Limits for tasks assessed                                         General Comments      Exercises General Exercises - Lower Extremity Ankle Circles/Pumps: AROM;10 reps;Both Quad Sets: AROM;10 reps;Both Heel Slides: AAROM;Left;10 reps Hip ABduction/ADduction: AROM;10 reps;Left   Assessment/Plan    PT Assessment Patient needs continued PT services  PT Problem List Decreased strength;Decreased mobility;Decreased range of motion;Decreased activity tolerance;Decreased balance;Decreased knowledge of use of DME;Pain       PT Treatment Interventions DME instruction;Functional mobility training;Therapeutic activities;Gait training;Stair training;Therapeutic exercise;Balance training;Patient/family education;Neuromuscular re-education    PT Goals (Current goals can be found in the Care Plan section)  Acute Rehab PT Goals PT Goal Formulation: With patient Time For Goal Achievement: 06/21/18 Potential to Achieve Goals: Good    Frequency Min 3X/week   Barriers to discharge        Co-evaluation               AM-PAC PT "6 Clicks" Mobility  Outcome Measure Help needed turning from your back to your side while in a flat bed without using bedrails?: A Little Help needed moving from lying on your back to sitting on the side of a flat bed without using bedrails?: A Little Help needed moving to and from a bed to a chair (including a wheelchair)?: A Lot Help needed standing up from a chair using your arms (e.g., wheelchair or bedside chair)?: A Lot Help needed to walk in hospital room?: A Lot Help needed climbing 3-5 steps with a railing? : Total 6 Click Score: 13    End of Session Equipment Utilized During Treatment: Gait belt Activity Tolerance: Patient tolerated treatment well Patient left: in chair;with call bell/phone within reach;with chair alarm set Nurse Communication: Mobility status PT Visit Diagnosis: Other abnormalities of gait and mobility (R26.89)    Time: 5361-4431 PT Time Calculation (min) (ACUTE ONLY): 27 min   Charges:   PT  Evaluation $PT Eval Low Complexity: 1 Low PT Treatments $Gait Training: 8-22 mins       Carmelia Bake, PT, DPT Acute Rehabilitation Services Office: [phone] Pager: [place] 06/14/2018, 1:15 PM

## 2018-06-14 NOTE — Evaluation (Signed)
Occupational Therapy Evaluation Patient Details Name: [name] MRN: [id number] DOB: [birth year] Today's Date: 06/14/2018    History of Present Illness 78 y.o. female with medical history significant for well controlled insulin dependent t2dm, osteoporosis, htn, spinal stenosis, chronic pain, persistent asthma, who presents with after mechanical fall at gas station, landed on left side. s/p INTRAMEDULLARY (IM) NAIL FEMORAL (Left)   Clinical Impression   Pt admitted with the above diagnoses and presents with below problem list. Pt will benefit from continued acute OT to address the below listed deficits and maximize independence with basic ADLs prior to d/c to venue below. PTA pt was mod I with ADLs, drives. Pt is currently mod-max A +2 physical A with functional transfers, pericare and LB ADLs. Increased pain with mobility/transfers. Pt reporting feeling "very woozy" after standing from [facility], appeared to have near syncopal episode. Vitals assessed: bp 92/56, O2 98, HR 73. Nursing notified. Sitting in recliner at end of session, reports feeling better but wooziness and nausea not completely resolved, nursing present.      Follow Up Recommendations  SNF    Equipment Recommendations  Other (comment)(defer to next venue)    Recommendations for Other Services PT consult     Precautions / Restrictions Precautions Precautions: Fall Restrictions Weight Bearing Restrictions: Yes LLE Weight Bearing: Weight bearing as tolerated      Mobility Bed Mobility Overal bed mobility: Needs Assistance Bed Mobility: Supine to Sit     Supine to sit: Min assist;+2 for physical assistance;HOB elevated     General bed mobility comments: asssit to advance LLE and powerup trunk to full EOB position. Pt able to use BUE to advance hips to EOB with extra time and effort.  Transfers Overall transfer level: Needs assistance Equipment used: Rolling walker (2 wheeled) Transfers: Sit to/from [employer] Sit to Stand: Mod assist;+2 physical assistance;From elevated surface Stand pivot transfers: Mod assist;+2 physical assistance       General transfer comment: cues for technique with rw. Cues for sequencing movements. Assist to power up and steady.  Increased pain with transfers/mobility.    Balance Overall balance assessment: Needs assistance Sitting-balance support: Bilateral upper extremity supported;Feet supported Sitting balance-Leahy Scale: Fair Sitting balance - Comments: BUE support mostly due to pain.   Standing balance support: Bilateral upper extremity supported;During functional activity Standing balance-Leahy Scale: Poor Standing balance comment: rw and steadying assist in standing position                           ADL either performed or assessed with clinical judgement   ADL Overall ADL's : Needs assistance/impaired Eating/Feeding: Set up;Sitting   Grooming: Set up;Sitting   Upper Body Bathing: Set up;Sitting   Lower Body Bathing: Moderate assistance;+2 for physical assistance;Sit to/from stand   Upper Body Dressing : Set up;Sitting   Lower Body Dressing: Moderate assistance;+2 for physical assistance;Sit to/from stand   Toilet Transfer: +2 for physical assistance;BSC;RW;Stand-pivot;Maximal assistance   Toileting- Clothing Manipulation and Hygiene: Moderate assistance;+2 for physical assistance;Sit to/from stand   Tub/ Banker: Moderate assistance;+2 for physical assistance;Stand-pivot;3 in 1;Rolling walker     General ADL Comments: Pt completed bed mobility, SPT EOB to [facility] and pericare as detailed above. Attempted SPT BSC to recliner but ultimately brought recliner  behind her due to onset of feeling "woozy".     Vision         Perception     Praxis  Pertinent Vitals/Pain Pain Assessment: Faces Faces Pain Scale: Hurts whole lot Pain Location: RLE with transfers Pain Descriptors / Indicators:  Sore;Moaning;Grimacing;Guarding     Hand Dominance Right   Extremity/Trunk Assessment Upper Extremity Assessment Upper Extremity Assessment: Overall WFL for tasks assessed   Lower Extremity Assessment Lower Extremity Assessment: Defer to PT evaluation       Communication Communication Communication: No difficulties   Cognition Arousal/Alertness: Awake/alert Behavior During Therapy: WFL for tasks assessed/performed Overall Cognitive Status: Within Functional Limits for tasks assessed                                     General Comments       Exercises     Shoulder Instructions      Home Living Family/patient expects to be discharged to:: Private residence Living Arrangements: Alone Available Help at Discharge: Family;Friend(s);Available PRN/intermittently Type of Home: House Home Access: Stairs to enter [employer] of Steps: 2 Entrance Stairs-Rails: Right Home Layout: Two level;Able to live on main level with bedroom/bathroom Alternate Level Stairs-Number of Steps: flight Alternate Level Stairs-Rails: Right;Left Bathroom Shower/Tub: Occupational psychologist: Handicapped height Bathroom Accessibility: Yes How Accessible: Accessible via walker Home Equipment: [place] - 4 wheels;Bedside commode;Shower seat;Grab bars - toilet;Grab bars - tub/shower;Hand held shower head;Adaptive equipment Adaptive Equipment: Reacher;Sock aid;Long-handled sponge        Prior Functioning/Environment Level of Independence: Independent with assistive device(s)        Comments: rollator in the house; drives        OT Problem List: Decreased activity tolerance;Impaired balance (sitting and/or standing);Decreased knowledge of use of DME or AE;Decreased knowledge of precautions;Pain      OT Treatment/Interventions: Self-care/ADL training;DME and/or AE instruction;Therapeutic activities;Patient/family education;Balance training    OT Goals(Current  goals can be found in the care plan section) Acute Rehab OT Goals Patient Stated Goal: rehab at [place] home OT Goal Formulation: With patient Time For Goal Achievement: 06/29/18 Potential to Achieve Goals: Good ADL Goals Pt Will Perform Lower Body Bathing: with min guard assist;sit to/from stand  OT Frequency: Min 2X/week   Barriers to D/C:            Co-evaluation              AM-PAC OT "6 Clicks" Daily Activity     Outcome Measure Help from another person eating meals?: None Help from another person taking care of personal grooming?: A Little Help from another person toileting, which includes using toliet, bedpan, or urinal?: A Lot Help from another person bathing (including washing, rinsing, drying)?: A Lot Help from another person to put on and taking off regular upper body clothing?: A Little Help from another person to put on and taking off regular lower body clothing?: A Lot 6 Click Score: 16   End of Session Equipment Utilized During Treatment: Rolling walker;Gait belt Nurse Communication: Other (comment);Mobility status;Precautions;Weight bearing status  Activity Tolerance: Patient limited by pain Patient left: in chair;with call bell/phone within reach  OT Visit Diagnosis: Unsteadiness on feet (R26.81);Pain;History of falling (Z91.81);Other abnormalities of gait and mobility (R26.89) Pain - Right/Left: Left Pain - part of body: Leg                Time: 0932-1020 OT Time Calculation (min): 48 min Charges:  OT General Charges $OT Visit: 1 Visit OT Evaluation $OT Eval Moderate Complexity: 1 Mod OT Treatments $Self Care/Home  Management : 38-52 mins  Tyrone Schimke, OT Acute Rehabilitation Services Pager: [phone] Office: [phone]   Hortencia Pilar 06/14/2018, 11:09 AM

## 2018-06-14 NOTE — Progress Notes (Addendum)
Subjective: 1 Day Post-Op Procedure(s) (LRB): INTRAMEDULLARY (IM) NAIL FEMORAL (Left) Patient reports pain as moderate.  Otherwise, feeling ok.   Objective: Vital signs in last 24 hours: Temp:  [97.5 F (36.4 C)-98.3 F (36.8 C)] 98 F (36.7 C) (02/23 0517) Pulse Rate:  [59-83] 70 (02/23 0517) Resp:  [13-20] 16 (02/23 0103) BP: (104-149)/(57-96) 104/64 (02/23 0517) SpO2:  [94 %-100 %] 100 % (02/23 0103)  Intake/Output from previous day: 02/22 0701 - 02/23 0700 In: 4504.1 [P.O.:920; I.V.:3334.3; IV Piggyback:249.9] Out: 1900 [Urine:1650; Blood:250] Intake/Output this shift: Total I/O In: 287 [P.O.:260; IV Piggyback:27] Out: 650 [Urine:650]  Recent Labs    06/12/18 1850  HGB 14.8   Recent Labs    06/12/18 1850  WBC 7.9  RBC 5.50*  HCT 47.7*  PLT 282   Recent Labs    06/12/18 1850 06/14/18 0428  NA 138 137  K 3.8 4.1  CL 103 107  CO2 27 23  BUN 27* 29*  CREATININE 0.98 1.40*  GLUCOSE 121* 269*  CALCIUM 9.6 7.8*   Recent Labs    06/13/18 0404  INR 0.98    Neurologically intact Neurovascular intact Sensation intact distally Intact pulses distally Dorsiflexion/Plantar flexion intact Incision: dressing C/D/I No cellulitis present Compartment soft   Assessment/Plan: 1 Day Post-Op Procedure(s) (LRB): INTRAMEDULLARY (IM) NAIL FEMORAL (Left) Up with therapy WBAT LLE ABLA- mild and stable Dry dressing prn D/c dispo per medicine     [name] 06/14/2018, 6:44 AM

## 2018-06-15 ENCOUNTER — Other Ambulatory Visit: Payer: Self-pay | Admitting: Internal Medicine

## 2018-06-15 DIAGNOSIS — I10 Essential (primary) hypertension: Secondary | ICD-10-CM | POA: Diagnosis not present

## 2018-06-15 DIAGNOSIS — Z981 Arthrodesis status: Secondary | ICD-10-CM | POA: Diagnosis not present

## 2018-06-15 DIAGNOSIS — G629 Polyneuropathy, unspecified: Secondary | ICD-10-CM | POA: Diagnosis not present

## 2018-06-15 DIAGNOSIS — M170 Bilateral primary osteoarthritis of knee: Secondary | ICD-10-CM | POA: Diagnosis not present

## 2018-06-15 DIAGNOSIS — S79929A Unspecified injury of unspecified thigh, initial encounter: Secondary | ICD-10-CM | POA: Diagnosis not present

## 2018-06-15 DIAGNOSIS — Z794 Long term (current) use of insulin: Secondary | ICD-10-CM | POA: Diagnosis not present

## 2018-06-15 DIAGNOSIS — R2681 Unsteadiness on feet: Secondary | ICD-10-CM | POA: Diagnosis not present

## 2018-06-15 DIAGNOSIS — M47816 Spondylosis without myelopathy or radiculopathy, lumbar region: Secondary | ICD-10-CM | POA: Diagnosis not present

## 2018-06-15 DIAGNOSIS — M2550 Pain in unspecified joint: Secondary | ICD-10-CM | POA: Diagnosis not present

## 2018-06-15 DIAGNOSIS — S72332D Displaced oblique fracture of shaft of left femur, subsequent encounter for closed fracture with routine healing: Secondary | ICD-10-CM | POA: Diagnosis not present

## 2018-06-15 DIAGNOSIS — Z7401 Bed confinement status: Secondary | ICD-10-CM | POA: Diagnosis not present

## 2018-06-15 DIAGNOSIS — W19XXXA Unspecified fall, initial encounter: Secondary | ICD-10-CM | POA: Diagnosis not present

## 2018-06-15 DIAGNOSIS — M797 Fibromyalgia: Secondary | ICD-10-CM | POA: Diagnosis not present

## 2018-06-15 DIAGNOSIS — S72345D Nondisplaced spiral fracture of shaft of left femur, subsequent encounter for closed fracture with routine healing: Secondary | ICD-10-CM | POA: Diagnosis not present

## 2018-06-15 DIAGNOSIS — S72002A Fracture of unspecified part of neck of left femur, initial encounter for closed fracture: Secondary | ICD-10-CM | POA: Diagnosis not present

## 2018-06-15 DIAGNOSIS — Z9181 History of falling: Secondary | ICD-10-CM | POA: Diagnosis not present

## 2018-06-15 DIAGNOSIS — I959 Hypotension, unspecified: Secondary | ICD-10-CM | POA: Diagnosis not present

## 2018-06-15 DIAGNOSIS — E0842 Diabetes mellitus due to underlying condition with diabetic polyneuropathy: Secondary | ICD-10-CM | POA: Diagnosis not present

## 2018-06-15 DIAGNOSIS — E118 Type 2 diabetes mellitus with unspecified complications: Secondary | ICD-10-CM | POA: Diagnosis not present

## 2018-06-15 DIAGNOSIS — D649 Anemia, unspecified: Secondary | ICD-10-CM | POA: Diagnosis not present

## 2018-06-15 DIAGNOSIS — R262 Difficulty in walking, not elsewhere classified: Secondary | ICD-10-CM | POA: Diagnosis not present

## 2018-06-15 DIAGNOSIS — E1142 Type 2 diabetes mellitus with diabetic polyneuropathy: Secondary | ICD-10-CM | POA: Diagnosis not present

## 2018-06-15 DIAGNOSIS — E1140 Type 2 diabetes mellitus with diabetic neuropathy, unspecified: Secondary | ICD-10-CM | POA: Diagnosis not present

## 2018-06-15 DIAGNOSIS — E039 Hypothyroidism, unspecified: Secondary | ICD-10-CM | POA: Diagnosis not present

## 2018-06-15 DIAGNOSIS — E785 Hyperlipidemia, unspecified: Secondary | ICD-10-CM | POA: Diagnosis not present

## 2018-06-15 DIAGNOSIS — S72002D Fracture of unspecified part of neck of left femur, subsequent encounter for closed fracture with routine healing: Secondary | ICD-10-CM | POA: Diagnosis not present

## 2018-06-15 DIAGNOSIS — N179 Acute kidney failure, unspecified: Secondary | ICD-10-CM | POA: Diagnosis not present

## 2018-06-15 DIAGNOSIS — M810 Age-related osteoporosis without current pathological fracture: Secondary | ICD-10-CM | POA: Diagnosis not present

## 2018-06-15 DIAGNOSIS — M48061 Spinal stenosis, lumbar region without neurogenic claudication: Secondary | ICD-10-CM | POA: Diagnosis not present

## 2018-06-15 DIAGNOSIS — M6281 Muscle weakness (generalized): Secondary | ICD-10-CM | POA: Diagnosis not present

## 2018-06-15 DIAGNOSIS — R1312 Dysphagia, oropharyngeal phase: Secondary | ICD-10-CM | POA: Diagnosis not present

## 2018-06-15 LAB — CBC
HEMATOCRIT: 31.3 % — AB (ref 36.0–46.0)
Hemoglobin: 9.9 g/dL — ABNORMAL LOW (ref 12.0–15.0)
MCH: 27.2 pg (ref 26.0–34.0)
MCHC: 31.6 g/dL (ref 30.0–36.0)
MCV: 86 fL (ref 80.0–100.0)
Platelets: 194 10*3/uL (ref 150–400)
RBC: 3.64 MIL/uL — ABNORMAL LOW (ref 3.87–5.11)
RDW: 14.6 % (ref 11.5–15.5)
WBC: 10 10*3/uL (ref 4.0–10.5)
nRBC: 0 % (ref 0.0–0.2)

## 2018-06-15 LAB — BASIC METABOLIC PANEL
Anion gap: 7 (ref 5–15)
BUN: 28 mg/dL — ABNORMAL HIGH (ref 8–23)
CO2: 25 mmol/L (ref 22–32)
CREATININE: 1.19 mg/dL — AB (ref 0.44–1.00)
Calcium: 8.5 mg/dL — ABNORMAL LOW (ref 8.9–10.3)
Chloride: 106 mmol/L (ref 98–111)
GFR calc Af Amer: 51 mL/min — ABNORMAL LOW (ref >60)
GFR calc non Af Amer: 44 mL/min — ABNORMAL LOW (ref >60)
Glucose, Bld: 190 mg/dL — ABNORMAL HIGH (ref 70–99)
Potassium: 4.4 mmol/L (ref 3.5–5.1)
Sodium: 138 mmol/L (ref 135–145)

## 2018-06-15 LAB — GLUCOSE, CAPILLARY
Glucose-Capillary: 168 mg/dL — ABNORMAL HIGH (ref 70–99)
Glucose-Capillary: 299 mg/dL — ABNORMAL HIGH (ref 70–99)

## 2018-06-15 MED ORDER — OXYCODONE HCL 5 MG PO TABS: 5.0000 mg | ORAL_TABLET | ORAL | 0 refills | Status: DC | PRN

## 2018-06-15 MED ORDER — METHOCARBAMOL 500 MG PO TABS: 500.0000 mg | ORAL_TABLET | Freq: Four times a day (QID) | ORAL | 0 refills | Status: DC | PRN

## 2018-06-15 MED ORDER — AMLODIPINE BESYLATE 10 MG PO TABS: 10.0000 mg | ORAL_TABLET | Freq: Every day | ORAL | 0 refills | Status: DC

## 2018-06-15 MED ORDER — ASPIRIN 325 MG PO TBEC: 325.0000 mg | DELAYED_RELEASE_TABLET | Freq: Every day | ORAL | 0 refills | Status: DC

## 2018-06-15 NOTE — Discharge Instructions (Signed)
You may put all of your weight on your left leg as comfort allows. You can get your incisions and dressing wet in the shower daily.

## 2018-06-15 NOTE — Progress Notes (Signed)
Subjective: 2 Days Post-Op Procedure(s) (LRB): INTRAMEDULLARY (IM) NAIL FEMORAL (Left) Patient reports pain as moderate.  Slow mobility with therapy.  She is requesting short-term skilled nursing placement.  Objective: Vital signs in last 24 hours: Temp:  [97.5 F (36.4 C)-100.2 F (37.9 C)] 100.2 F (37.9 C) (02/24 0458) Pulse Rate:  [72-91] 91 (02/24 0458) Resp:  [14-18] 18 (02/24 0458) BP: (92-134)/(54-65) 134/65 (02/24 0458) SpO2:  [80 %-99 %] 93 % (02/24 0458)  Intake/Output from previous day: 02/23 0701 - 02/24 0700 In: 1550 [P.O.:1550] Out: 500 [Urine:500] Intake/Output this shift: No intake/output data recorded.  Recent Labs    06/12/18 1850 06/14/18 0428 06/15/18 0513  HGB 14.8 9.8* 9.9*   Recent Labs    06/14/18 0428 06/15/18 0513  WBC 6.9 10.0  RBC 3.64* 3.64*  HCT 32.9* 31.3*  PLT 211 194   Recent Labs    06/14/18 0428 06/15/18 0513  NA 137 138  K 4.1 4.4  CL 107 106  CO2 23 25  BUN 29* 28*  CREATININE 1.40* 1.19*  GLUCOSE 269* 190*  CALCIUM 7.8* 8.5*   Recent Labs    06/13/18 0404  INR 0.98    Sensation intact distally Intact pulses distally Dorsiflexion/Plantar flexion intact Incision: scant drainage No cellulitis present Compartment soft   Assessment/Plan: 2 Days Post-Op Procedure(s) (LRB): INTRAMEDULLARY (IM) NAIL FEMORAL (Left) Up with therapy - full WBAT left femur Social Work consult for short-term skilled nursing.      [name] 06/15/2018, 7:14 AM

## 2018-06-15 NOTE — Clinical Social Work Placement (Signed)
D/C Summary sent Nurse call report to:[phone] [facility] to transport.   CLINICAL SOCIAL WORK PLACEMENT  NOTE  Date:  06/15/2018  Patient Details  Name: SORA VROOMAN MRN: [id number] Date of Birth: [birth year]  Clinical Social Work is seeking post-discharge placement for this patient at the [place] level of care (*CSW will initial, date and re-position this form in  chart as items are completed):  Yes   Patient/family provided with [facility] Work Department's list of facilities offering this level of care within the geographic area requested by the patient (or if unable, by the patient's family).  Yes   Patient/family informed of their freedom to choose among providers that offer the needed level of care, that participate in Medicare, Medicaid or managed care program needed by the patient, have an available bed and are willing to accept the patient.  Yes   Patient/family informed of [facility]'s ownership interest in [facility] and [facility], as well as of the fact that they are under no obligation to receive care at these facilities.  PASRR submitted to EDS on       PASRR number received on       Existing PASRR number confirmed on 06/15/18     FL2 transmitted to all facilities in geographic area requested by pt/family on       FL2 transmitted to all facilities within larger geographic area on 06/15/18     Patient informed that his/her managed care company has contracts with or will negotiate with certain facilities, including the following:  [facility]     Yes   Patient/family informed of bed offers received.  Patient chooses bed at [facility]     Physician recommends and patient chooses bed at      Patient to be transferred to [facility] on 06/15/18.  Patient to be transferred to facility by [facility]      Patient family notified on 06/15/18 of transfer.  Name of family member  notified:  Patient to notify family and friends     PHYSICIAN       Additional Comment:    _______________________________________________ Lia Hopping, [place] 06/15/2018, 12:09 PM

## 2018-06-15 NOTE — Progress Notes (Signed)
Report called to [facility] at [facility]  Patient stable.  Awaiting transport.

## 2018-06-15 NOTE — NC FL2 (Signed)
[place] LEVEL OF CARE SCREENING TOOL     IDENTIFICATION  Patient Name: [name] Birthdate: [birth year] Sex: female Admission Date (Current Location): 06/12/2018  [facility] and [place] Number:  Herbalist and Address:  [facility],  [place] [address] [place]      Provider Number: [id number]  Attending Physician Name and Address:  Mercy Riding, MD  Relative Name and Phone Number:       Current Level of Care: Hospital Recommended Level of Care: [place] Prior Approval Number:    Date Approved/Denied:   PASRR Number: [id number]  Discharge Plan: SNF    Current Diagnoses: Patient Active Problem List   Diagnosis Date Noted  . Fracture, proximal femur ([place]) 06/12/2018  . Femur fracture, left ([place]) 06/12/2018  . Acute blood loss as cause of postoperative anemia 03/30/2017  . Diabetic polyneuropathy associated with secondary diabetes mellitus ([place]) 03/30/2017  . Asthma 03/30/2017  . Dyslipidemia 03/26/2017  . Lumbar stenosis with neurogenic claudication 03/17/2017  . Spinal stenosis of lumbar region 03/03/2017  . Myoclonic jerking   . Nausea   . Acute encephalopathy 07/15/2016  . Lumbar radiculopathy 06/27/2016  . Hypothyroidism 06/26/2016  . Labile blood glucose   . Surgery, elective   . Post-operative pain   . Sickle cell trait ([place])   . Acute blood loss anemia   . History of back surgery   . AKI (acute kidney injury) ([place])   . Herniated nucleus pulposus, L2-3 06/25/2016  . Bilateral primary osteoarthritis of knee 03/26/2016  . Osteoarthritis, hand 03/26/2016  . Other insomnia 03/26/2016  . Memory disorder 02/16/2016  . Fibromyalgia 09/27/2014  . Nocturnal leg cramps 09/27/2014  . Body mass index (BMI) of 30.0-30.9 in adult 08/04/2014  . Neuropathy 03/10/2014  . Allergic rhinitis 03/10/2014  . Osteoporosis 03/10/2014  . Spinal stenosis of lumbar region with radiculopathy 02/28/2014  . Type  II or unspecified type diabetes mellitus without mention of complication, uncontrolled 07/08/2013  . Hypokalemia 11/02/2012  . Essential hypertension, benign 11/02/2012  . Diabetes mellitus with neuropathy ([place]) 10/29/2012  . Hyperlipidemia 10/29/2012  . Spondylolisthesis of lumbar region 03/19/2011  . Lumbar radicular pain 03/19/2011    Orientation RESPIRATION BLADDER Height & Weight     Self, Time, Situation, Place  Normal Continent Weight: 183 lb 6.4 oz (83.2 kg) Height:  5\' 4"  (162.6 cm)  BEHAVIORAL SYMPTOMS/MOOD NEUROLOGICAL BOWEL NUTRITION STATUS      Continent Diet(See discharge summary )  AMBULATORY STATUS COMMUNICATION OF NEEDS Skin   Extensive Assist Verbally Surgical wounds                       Personal Care Assistance Level of Assistance  Bathing, Feeding, Dressing Bathing Assistance: Limited assistance Feeding assistance: Independent Dressing Assistance: Maximum assistance     Functional Limitations Info  Sight, Speech, Hearing Sight Info: Adequate Hearing Info: Adequate Speech Info: Adequate    SPECIAL CARE FACTORS FREQUENCY  PT (By licensed PT), OT (By licensed OT)     PT Frequency: 5x/week OT Frequency: 5x/week             Contractures Contractures Info: Not present    Additional Factors Info  Code Status, Allergies, Psychotropic, Insulin Sliding Scale Code Status Info: fullcode  Allergies Info: Allergies: Cymbalta Duloxetine Hcl, Baclofen, Betadine Povidone Iodine, Adhesive Tape, Lipitor Atorvastatin   Insulin Sliding Scale Info: See discharge summary        Current Medications (  06/15/2018):  This is the current hospital active medication list Current Facility-Administered Medications  Medication Dose Route Frequency Provider Last Rate Last Dose  . acetaminophen (TYLENOL) tablet 325-650 mg  325-650 mg Oral Q6H PRN [name], MD   650 mg at 06/15/18 0502  . albuterol (PROVENTIL) (2.5 MG/3ML) 0.083% nebulizer solution 3 mL   3 mL Inhalation TID PRN [name], MD      . alum & mag hydroxide-simeth (MAALOX/MYLANTA) 200-200-20 MG/5ML suspension 30 mL  30 mL Oral Q4H PRN [name], MD      . amLODipine (NORVASC) tablet 10 mg  10 mg Oral Daily Wendee Beavers T, MD   10 mg at 06/15/18 2703  . aspirin EC tablet 325 mg  325 mg Oral Q breakfast [name], MD   325 mg at 06/15/18 5009  . gabapentin (NEURONTIN) capsule 600 mg  600 mg Oral TID [name], MD   600 mg at 06/15/18 3818  . HYDROmorphone (DILAUDID) injection 0.5-1 mg  0.5-1 mg Intravenous Q4H PRN [name], MD   1 mg at 06/14/18 1023  . insulin aspart (novoLOG) injection 0-15 Units  0-15 Units Subcutaneous TID WC [name], MD   3 Units at 06/15/18 0831  . insulin aspart (novoLOG) injection 0-5 Units  0-5 Units Subcutaneous QHS [name], MD      . levothyroxine (SYNTHROID, LEVOTHROID) tablet 50 mcg  50 mcg Oral QAC breakfast [name], MD   50 mcg at 06/15/18 0502  . menthol-cetylpyridinium (CEPACOL) lozenge 3 mg  1 lozenge Oral PRN [name], MD       Or  . phenol (CHLORASEPTIC) mouth spray 1 spray  1 spray Mouth/Throat PRN [name], MD      . methocarbamol (ROBAXIN) tablet 500 mg  500 mg Oral Q6H PRN [name], MD   500 mg at 06/14/18 0827   Or  . methocarbamol (ROBAXIN) 500 mg in dextrose 5 % 50 mL IVPB  500 mg Intravenous Q6H PRN [name], MD   Stopped at 06/13/18 1432  . methocarbamol (ROBAXIN) tablet 500 mg  500 mg Oral Q6H PRN [name], MD   500 mg at 06/13/18 0519  . metoCLOPramide (REGLAN) tablet 5-10 mg  5-10 mg Oral Q8H PRN [name], MD       Or  . metoCLOPramide (REGLAN) injection 5-10 mg  5-10 mg Intravenous Q8H PRN [name], MD      . mometasone-formoterol [facility]) 100-5 MCG/ACT inhaler 2 puff  2 puff Inhalation BID [name], MD    2 puff at 06/15/18 0848  . ondansetron (ZOFRAN) tablet 4 mg  4 mg Oral Q6H PRN [name], MD       Or  . ondansetron [facility] injection 4 mg  4 mg Intravenous Q6H PRN [name], MD   4 mg at 06/14/18 1023  . oxyCODONE (Oxy IR/ROXICODONE) immediate release tablet 10-15 mg  10-15 mg Oral Q4H PRN [name], MD   10 mg at 06/15/18 2993  . oxyCODONE (Oxy IR/ROXICODONE) immediate release tablet 5-10 mg  5-10 mg Oral Q4H PRN [name], MD   5 mg at 06/13/18 1834  . pantoprazole (PROTONIX) EC tablet 40 mg  40 mg Oral Daily [name], MD   40 mg at 06/15/18 7169  . senna-docusate (Senokot-S) tablet 1 tablet  1 tablet Oral BID PRN Wendee Beavers  T, MD         Discharge Medications: Please see discharge summary for a list of discharge medications.  Relevant Imaging Results:  Relevant Lab Results:   Additional Information SS#: [id number]  Lia Hopping, LCSW

## 2018-06-15 NOTE — Care Management Important Message (Signed)
Important Message  Patient Details  Name: [name] MRN: [id number] Date of Birth: [birth year]   Medicare Important Message Given:  Yes    Kerin Salen 06/15/2018, 12:57 [place] Message  Patient Details  Name: [name] MRN: [id number] Date of Birth: Jul 02, 1940   Medicare Important Message Given:  Yes    Kerin Salen 06/15/2018, 12:57 PM

## 2018-06-15 NOTE — Clinical Social Work Note (Signed)
Clinical Social Work Assessment  Patient Details  Name: [name] MRN: [id number] Date of Birth: [birth year]  Date of referral:  06/15/18               Reason for consult:  Facility Placement                Permission sought to share information with:  Family Supports Permission granted to share information::  Yes, Verbal Permission Granted  Name::        Agency::  Health and safety inspector SNF  Relationship::     Contact Information:     Housing/Transportation Living arrangements for the past 2 months:  Single Family Home Source of Information:  Patient Patient Interpreter Needed:  None Criminal Activity/Legal Involvement Pertinent to Current Situation/Hospitalization:  No - Comment as needed Significant Relationships:  Other Family Members, Siblings Lives with:  Self Do you feel safe going back to the place where you live?  Yes Need for family participation in patient care:  Yes (Comment)  Care giving concerns:   Patient was in usual state of health today when at gas station tripped and fell, landing on left side. No head trauma, no LOC. Immediate pain and inability to ambulate ambulate afterward. Denies history of fracture. Endorses hx osteoporosis, says was treated for many years and last year was told had been treated long enough to stop medication. Former smoker, drinks occasionally.  PT recommends SNF for rehab.  Social Worker assessment / plan:  CSW met with the patient at bedside to discuss discharge planning to SNF. Patient agreeable to SNF for rehab. Patient reports she has been to [facility] in the past for back surgery and prefers to return. Patient was independent with all ADL's prior to falling.  CSW explain SNF process, and transition to rehab today. Patient has arranged for her friends assist with the transition to SNF.   FL2 complete.  PASRR done.   Plan: SNF for rehab.   Employment status:  Retired Forensic scientist:  Medicare PT Recommendations:  [place] / Referral to community resources:  [place]  Patient/Family's Response to care:  Agreeable and Responding well to care.   Patient/Family's Understanding of and Emotional Response to Diagnosis, Current Treatment, and Prognosis:  Patient has a good understanding of her diagnosis and current treatment. Patient reports having lots of family/ friend  support.   Emotional Assessment Appearance:  Appears stated age Attitude/Demeanor/Rapport:    Affect (typically observed):  Accepting, Calm Orientation:  Oriented to Self, Oriented to Place, Oriented to  Time, Oriented to Situation Alcohol / Substance use:  Not Applicable Psych involvement (Current and /or in the community):  No (Comment)  Discharge Needs  Concerns to be addressed:  Discharge Planning Concerns Readmission within the last 30 days:  No Current discharge risk:  Dependent with Mobility Barriers to Discharge:  No Barriers Identified   Lia Hopping, LCSW 06/15/2018, 12:16 PM

## 2018-06-15 NOTE — Discharge Summary (Signed)
Physician Discharge Summary  DENELLE CAPURRO ZOX:[id number] DOB: 1941-04-14 DOA: 06/12/2018  PCP: Wenda Low, MD  Admit date: 06/12/2018 Discharge date: 06/15/2018  Admitted From: Home Disposition: SNF  Recommendations for Outpatient Follow-up:  1. Follow up with orthopedic surgery in 2 weeks 2. Please obtain BMP/CBC at follow-up 3. Please follow up on the following pending results: None  Discharge Condition: Stable CODE STATUS: Full code  Hospital Course: NOLLIE SHIFLETT a 78 y.o.femalewith medical history significant forwell controlled insulin dependent t2dm, osteoporosis, htn, spinal stenosis, chronic pain, persistent asthma, who presents after mechanical fall and subsequent left femoral fracture as noted on femoral and hip x-ray.  She had antegrade intramedullary nail placement on 2/22.  Postop course complicated by brief AKI that has improved prior to discharge.  Irbesartan held as a result of that.  Started on amlodipine for blood pressure.  Evaluated by physical therapy who recommended SNF.  Discharge on aspirin 325 mg daily for VT prophylaxis per orthopedic surgery.  Robaxin and oxycodone as needed for pain for pain control.   See individual problem list below for more.  Discharge Diagnoses:  Principal Problem:   Fracture, proximal femur ([place]) Active Problems:   Diabetes mellitus with neuropathy ([place])   Essential hypertension, benign   Osteoporosis   Hypothyroidism   Spinal stenosis of lumbar region   Asthma   Femur fracture, left ([facility])  Mechanical fall Left femoral fracture -Femoral and hip x-ray showed acute displaced, angulated and overriding fracture involving the proximal shaft of left femur -Status post antegrade intramedullary nail placement on 2/22 by Dr. Ninfa Linden -Weightbearing as tolerated. -Aspirin 325 mg daily for VT prophylaxis. -PRN oxycodone Robaxin for pain.  Emphasize bowel regimen while on this medication. -Ongoing physical therapy at  SNF -Orthopedic surgery follow-up in 2 weeks  AKI: Creatinine trend: 0.96--> 1.41--> 1.2.  -Hold irbesartan.  May resume at follow-up based on renal function. -Started amlodipine 10 mg for blood pressure -Recheck renal function in 1 week  Well-controlled IDDM-2: A1c 7.3% last months.  CBG fairly controlled -Discharged on home medications  Hypertension: BP fairly controlled -BP medications as above.  Asthma: No pulmonary symptoms -PRN albuterol -Dulera in place of home Symbicort  Hypothyroidism: -Continue home Synthroid  Discharge Instructions  Discharge Instructions    Call MD for:  persistant dizziness or light-headedness   Complete by:  As directed    Call MD for:  persistant nausea and vomiting   Complete by:  As directed    Call MD for:  severe uncontrolled pain   Complete by:  As directed    Diet - low sodium heart healthy   Complete by:  As directed    Diet Carb Modified   Complete by:  As directed    Full weight bearing   Complete by:  As directed    Increase activity slowly   Complete by:  As directed      Allergies as of 06/15/2018      Reactions   Cymbalta [duloxetine Hcl] Diarrhea, Other (See Comments)   Dizziness, headache, irritability   Baclofen    jerks    Betadine [povidone Iodine] Swelling, Other (See Comments)   SWELLING REACTION DESCRIPTION/SEVERITY UNSPECIFIED  Reaction to betadine eye drops   Adhesive [tape] Rash   Lipitor [atorvastatin] Rash      Medication List    STOP taking these medications   ibuprofen 800 MG tablet Commonly known as:  ADVIL,MOTRIN   irbesartan 300 MG tablet Commonly known as:  AVAPRO  meloxicam 15 MG tablet Commonly known as:  MOBIC   predniSONE 10 MG (21) Tbpk tablet Commonly known as:  STERAPRED UNI-PAK 21 TAB   pregabalin 50 MG capsule Commonly known as:  LYRICA     TAKE these medications   acetaminophen 500 MG tablet Commonly known as:  TYLENOL Take 1,000 mg by mouth every 8 (eight) hours  as needed for mild pain or moderate pain.   amLODipine 10 MG tablet Commonly known as:  NORVASC Take 1 tablet (10 mg total) by mouth daily. Start taking on:  June 16, 2018   aspirin 325 MG EC tablet Take 1 tablet (325 mg total) by mouth daily with breakfast. What changed:    medication strength  how much to take  when to take this   BD PEN NEEDLE NANO U/F 32G X 4 MM Misc Generic drug:  Insulin Pen Needle USE AS DIRECTED   cetirizine 10 MG tablet Commonly known as:  ZYRTEC Take 10 mg by mouth daily as needed for allergies.   Colesevelam HCl 3.75 g Pack Dissolve 1 packet in unsweetened beverage and take daily with lunch   dapagliflozin propanediol 5 MG Tabs tablet Commonly known as:  FARXIGA Take 5 mg by mouth daily.   diphenhydrAMINE 25 MG tablet Commonly known as:  BENADRYL Take 25 mg by mouth every 6 (six) hours as needed for allergies.   fluticasone 50 MCG/ACT nasal spray Commonly known as:  FLONASE Place 1 spray into both nostrils daily as needed for allergies.   gabapentin 600 MG tablet Commonly known as:  NEURONTIN Take 1 tablet (600 mg total) by mouth 3 (three) times daily.   glucose blood test strip Commonly known as:  ONE TOUCH ULTRA TEST Use as instructed to check blood sugar daily.   levothyroxine 50 MCG tablet Commonly known as:  SYNTHROID, LEVOTHROID Take 1 tablet (50 mcg total) by mouth daily.   MAGNESIUM PO Take 250 mg by mouth 2 (two) times daily.   methocarbamol 500 MG tablet Commonly known as:  ROBAXIN Take 1 tablet (500 mg total) by mouth every 6 (six) hours as needed for muscle spasms.   NOVOLOG FLEXPEN 100 UNIT/ML FlexPen Generic drug:  insulin aspart INJECT 6 UNITS UNDER THE SKIN BEFORE BREAKFAST, 10 UNITS BEFORE LUNCH, AND 9 UNITS BEFORE SUPPER What changed:  See the new instructions.   oxyCODONE 5 MG immediate release tablet Commonly known as:  Oxy IR/ROXICODONE Take 1-2 tablets (5-10 mg total) by mouth every 4 (four) hours  as needed for moderate pain (pain score 4-6).   potassium chloride 10 MEQ tablet Commonly known as:  K-DUR,KLOR-CON Take 1 tablet (10 mEq total) by mouth once for 1 dose.   potassium chloride 10 MEQ tablet Commonly known as:  K-DUR,KLOR-CON Take 10 mEq by mouth 2 (two) times daily.   PROAIR RESPICLICK 833 (90 Base) MCG/ACT Aepb Generic drug:  Albuterol Sulfate Inhale 2 puffs into the lungs 3 (three) times daily as needed (shortness of breath).   SYMBICORT 80-4.5 MCG/ACT inhaler Generic drug:  budesonide-formoterol Inhale 2 puffs into the lungs 2 (two) times daily as needed (shortness of breath).   TRESIBA FLEXTOUCH 100 UNIT/ML Sopn FlexTouch Pen Generic drug:  insulin degludec INJECT 24 UNITS UNDER THE SKIN ONCE DAILY What changed:  See the new instructions.            Discharge Care Instructions  (From admission, onward)         Start     Ordered  06/15/18 0000  Full weight bearing     06/15/18 0719         Follow-up Information    [name], MD. Schedule an appointment as soon as possible for a visit in 2 week(s).   Specialty:  Orthopedic Surgery Contact information: [place] [address] [phone]           Consultations:  Orthopedic surgery  Procedures/Studies:  2D Echo: None obtained this admission  Dg C-arm 1-60 Min-no Report  Result Date: 06/13/2018 Fluoroscopy was utilized by the requesting physician.  No radiographic interpretation.   Dg Hip Operative Unilat W Or W/o Pelvis Left  Result Date: 06/13/2018 CLINICAL DATA:  Left femur ORIF. EXAM: OPERATIVE LEFT  HIP (WITH PELVIS IF PERFORMED) TECHNIQUE: Fluoroscopic spot image(s) were submitted for interpretation post-operatively. FLUOROSCOPY TIME:  2 minutes, 9 seconds. C-arm fluoroscopic images were obtained intraoperatively and submitted for post operative interpretation. COMPARISON:  Left femur x-rays from yesterday. FINDINGS: Multiple intraoperative  fluoroscopic images demonstrate interval intramedullary nailing of the left femur, with the proximal diaphyseal fracture now in anatomic alignment. IMPRESSION: Intraoperative fluoroscopic guidance for left femur ORIF. Electronically Signed   By: Titus Dubin M.D.   On: 06/13/2018 14:27   Dg Hip Unilat With Pelvis 2-3 Views Left  Result Date: 06/12/2018 CLINICAL DATA:  Twisted thigh, hip pain EXAM: DG HIP (WITH OR WITHOUT PELVIS) 2-3V LEFT COMPARISON:  None. FINDINGS: Postsurgical changes at the lumbosacral spine. The SI joints are non widened. The pubic symphysis and rami are intact. Both femoral heads project in joint. Acute fracture involving the proximal shaft of the femur at the junction of the proximal and middle thirds. Mild varus angulation of distal fracture fragment. One bone with posterior and close to 1 bone with central displacement of distal fracture fragment. IMPRESSION: Acute displaced and angulated proximal femoral shaft fracture Electronically Signed   By: Donavan Foil M.D.   On: 06/12/2018 19:20   Dg Femur Min 2 Views Left  Result Date: 06/12/2018 CLINICAL DATA:  Fall EXAM: LEFT FEMUR 2 VIEWS COMPARISON:  None. FINDINGS: Acute fracture proximal shaft of the femur at the junction of the proximal and middle thirds. Mild varus angulation of distal fracture fragment. Slightly under 1 bone with of central displacement and 1 bone with posterior displacement of distal fracture fragment. 2.9 cm of overriding. IMPRESSION: Acute displaced, angulated and overriding fracture involving the proximal shaft of the femur Electronically Signed   By: Donavan Foil M.D.   On: 06/12/2018 19:21     Subjective: Reports having a great night with friends and family's visiting.  No complaints this morning.  Pain well controlled.  Denies chest pain, dyspnea, nausea, vomiting or abdominal pain.   Discharge Exam: Vitals:   06/15/18 0837 06/15/18 0848  BP: 113/60   Pulse: 85   Resp: 15   Temp:    SpO2:  94% 94%    GENERAL: Appears well. No acute distress.  HEENT: MMM.  Vision and Hearing grossly intact.  NECK: Supple.  No JVD.  LUNGS:  No IWOB. Good air movement. CTAB.  HEART:  RRR. Heart sounds normal. ABD: Bowel sounds present. Soft. Non tender.  EXT:   no edema bilaterally.  Moving extremities SKIN: no apparent skin lesion.  NEURO: Awake, alert and oriented appropriately.  No gross deficit.  PSYCH: Calm. Normal affect.   The results of significant diagnostics from this hospitalization (including imaging, microbiology, ancillary and laboratory) are listed below for reference.  Microbiology: Recent Results (from the past 240 hour(s))  Surgical pcr screen     Status: None   Collection Time: 06/13/18 12:37 AM  Result Value Ref Range Status   MRSA, PCR NEGATIVE NEGATIVE Final   Staphylococcus aureus NEGATIVE NEGATIVE Final    Comment: (NOTE) The Xpert SA Assay (FDA approved for NASAL specimens in patients 44 years of age and older), is one component of a comprehensive surveillance program. It is not intended to diagnose infection nor to guide or monitor treatment. Performed at [facility], [place] [address], [place], [place] [address]      Labs: BNP (last 3 results) No results for input(s): BNP in the last 8760 hours. Basic Metabolic Panel: Recent Labs  Lab 06/09/18 1054 06/12/18 1850 06/14/18 0428 06/15/18 0513  NA 139 138 137 138  K 3.5 3.8 4.1 4.4  CL 104 103 107 106  CO2 26 27 23 25   GLUCOSE 129* 121* 269* 190*  BUN 16 27* 29* 28*  CREATININE 1.02 0.98 1.40* 1.19*  CALCIUM 9.6 9.6 7.8* 8.5*   Liver Function Tests: No results for input(s): AST, ALT, ALKPHOS, BILITOT, PROT, ALBUMIN in the last 168 hours. No results for input(s): LIPASE, AMYLASE in the last 168 hours. No results for input(s): AMMONIA in the last 168 hours. CBC: Recent Labs  Lab 06/12/18 1850 06/14/18 0428 06/15/18 0513  WBC 7.9 6.9 10.0  NEUTROABS 6.6  --   --     HGB 14.8 9.8* 9.9*  HCT 47.7* 32.9* 31.3*  MCV 86.7 90.4 86.0  PLT 282 211 194   Cardiac Enzymes: No results for input(s): CKTOTAL, CKMB, CKMBINDEX, TROPONINI in the last 168 hours. BNP: Invalid input(s): POCBNP CBG: Recent Labs  Lab 06/14/18 0804 06/14/18 1205 06/14/18 1756 06/14/18 2103 06/15/18 0743  GLUCAP 175* 167* 232* 150* 168*   D-Dimer No results for input(s): DDIMER in the last 72 hours. Hgb A1c No results for input(s): HGBA1C in the last 72 hours. Lipid Profile No results for input(s): CHOL, HDL, LDLCALC, TRIG, CHOLHDL, LDLDIRECT in the last 72 hours. Thyroid function studies No results for input(s): TSH, T4TOTAL, T3FREE, THYROIDAB in the last 72 hours.  Invalid input(s): FREET3 Anemia work up No results for input(s): VITAMINB12, FOLATE, FERRITIN, TIBC, IRON, RETICCTPCT in the last 72 hours. Urinalysis    Component Value Date/Time   COLORURINE STRAW (A) 07/15/2016 0023   APPEARANCEUR CLEAR 07/15/2016 0023   LABSPEC 1.009 07/15/2016 0023   PHURINE 5.0 07/15/2016 0023   GLUCOSEU NEGATIVE 07/15/2016 0023   GLUCOSEU 250 (A) 05/30/2014 1623   HGBUR NEGATIVE 07/15/2016 0023   BILIRUBINUR NEGATIVE 07/15/2016 0023   KETONESUR NEGATIVE 07/15/2016 0023   PROTEINUR NEGATIVE 07/15/2016 0023   UROBILINOGEN 0.2 05/30/2014 1623   NITRITE NEGATIVE 07/15/2016 0023   LEUKOCYTESUR NEGATIVE 07/15/2016 0023   Sepsis Labs Invalid input(s): PROCALCITONIN,  WBC,  LACTICIDVEN  Time coordinating discharge: 35 minutes  SIGNED:  Mercy Riding, MD  [facility] 06/15/2018, 11:28 AM Pager [phone]  If 7PM-7AM, please contact night-coverage [email or web address] Password [email or web address]

## 2018-06-16 ENCOUNTER — Non-Acute Institutional Stay (SKILLED_NURSING_FACILITY): Payer: Medicare Other | Admitting: Internal Medicine

## 2018-06-16 ENCOUNTER — Encounter: Payer: Self-pay | Admitting: Internal Medicine

## 2018-06-16 DIAGNOSIS — I10 Essential (primary) hypertension: Secondary | ICD-10-CM

## 2018-06-16 DIAGNOSIS — N179 Acute kidney failure, unspecified: Secondary | ICD-10-CM

## 2018-06-16 DIAGNOSIS — Z794 Long term (current) use of insulin: Secondary | ICD-10-CM

## 2018-06-16 DIAGNOSIS — S72002D Fracture of unspecified part of neck of left femur, subsequent encounter for closed fracture with routine healing: Secondary | ICD-10-CM

## 2018-06-16 DIAGNOSIS — E0842 Diabetes mellitus due to underlying condition with diabetic polyneuropathy: Secondary | ICD-10-CM

## 2018-06-16 DIAGNOSIS — E039 Hypothyroidism, unspecified: Secondary | ICD-10-CM

## 2018-06-16 DIAGNOSIS — E118 Type 2 diabetes mellitus with unspecified complications: Secondary | ICD-10-CM | POA: Diagnosis not present

## 2018-06-16 NOTE — Progress Notes (Signed)
:  Location:  [facility] Room Number: 435W Place of Service:  SNF (31)  Julia Canton D. Sheppard Coil, MD  Patient Care Team: Wenda Low, MD as PCP - General (Internal Medicine) Kristeen Miss, MD as Consulting Physician (Neurosurgery) Bo Merino, MD as Consulting Physician (Rheumatology)  Extended Emergency Contact Information Primary Emergency Contact: Patricia,Montgomery Address: [place], [place] [address] Johnnette Litter of [place] Phone: [phone] Relation: Sister Secondary Emergency Contact: Helane Rima [place] of [place] Phone: [phone] Relation: Sister     Allergies: Cymbalta [duloxetine hcl]; Baclofen; Betadine [povidone iodine]; Adhesive [tape]; and Lipitor [atorvastatin]  Chief Complaint  Patient presents with  . New Admit To SNF    Admit to [facility]    HPI: Patient is 78 y.o. female with well-controlled insulin-dependent type 2 diabetes, osteoporosis, hypertension, spinal stenosis, chronic pain, cystic asthma, CKD who presented to the emergency department after tripping and falling at a gas station.  Patient fell on her left side and had immediate pain and inability to ambulate.  Patient was admitted to [facility] long hospital from 10/20 1-24 where she underwent an intramedullary nail placement on 2/22.  Postop course was complicated by as well as AKA that improved with IV fluids.  Patient is admitted to skilled nursing facility for OT/PT.  While at skilled nursing facility patient will be followed for hypertension treated with Norvasc, diabetes mellitus treated with weekly dapaglifozin, insulin decludec and NovoLog, and hypothyroidism treated with Synthroid.  Past Medical History:  Diagnosis Date  . Acute blood loss anemia   . Acute encephalopathy 07/15/2016  . AKI (acute kidney injury) ([place])   . Anemia    has sickle cell trait  . Anxiety   . Arthritis   . Asthma    has used inhaler in past  for asthmatic bronchitis, last time- early 2012  . Bilateral primary osteoarthritis of knee 03/26/2016  . Complication of anesthesia    wakes up shaking  . Diabetes mellitus   . Diabetes mellitus with neuropathy ([place]) 10/29/2012  . Diverticulitis   . Dyslipidemia   . Encephalopathy 06/2016   due to medications after surgery  . Fibromyalgia   . GERD (gastroesophageal reflux disease)    occas. use of  Prilosec  . Heart murmur    sees Dr. Montez Morita, last seen- early 2012  . Herniated nucleus pulposus, L2-3 06/25/2016  . History of back surgery   . Hypertension    02/2010- stress test /w PCP  . Hypothyroidism   . Loose bowel movements 12/2016  . Lumbar radiculopathy 06/27/2016  . Lumbar stenosis with neurogenic claudication 03/17/2017  . Memory disorder 02/16/2016  . Myoclonic jerking   . Neuromuscular disorder (HCC)    lumbar radiculopathy, lumbago  . Nocturnal leg cramps 09/27/2014  . Osteoporosis 03/10/2014  . Pneumonia   . Sickle cell trait ([place])   . Sleep apnea    borderline sleep apnea, states she no longer uses, early 2012- stopped using   . Spinal stenosis of lumbar region with radiculopathy 02/28/2014  . Spondylolisthesis of lumbar region 03/19/2011  . Type II or unspecified type diabetes mellitus without mention of complication, uncontrolled 07/08/2013    Past Surgical History:  Procedure Laterality Date  . ABDOMINAL HYSTERECTOMY    . adb.cyst     ovarian cyst  . BACK SURGERY     2012, 2015 (3 total)  . BACK SURGERY  2018  02/24/2017  . COLONOSCOPY    . EYE SURGERY     macular degeneration treatment - injections  . OVARIAN CYST SURGERY    . POSTERIOR LUMBAR FUSION 4 LEVEL N/A 03/17/2017   Procedure: Decompression of Lumbar One-Two with Thoracic Ten to Lumbar Two Fusion;  Surgeon: Kristeen Miss, MD;  Location: [facility];  Service: Neurosurgery;  Laterality: N/A;  Decompression of L1-2 with T10 to L2 Fusion    Allergies as of 06/16/2018      Reactions   Cymbalta  [duloxetine Hcl] Diarrhea, Other (See Comments)   Dizziness, headache, irritability   Baclofen    jerks    Betadine [povidone Iodine] Swelling, Other (See Comments)   SWELLING REACTION DESCRIPTION/SEVERITY UNSPECIFIED  Reaction to betadine eye drops   Adhesive [tape] Rash   Lipitor [atorvastatin] Rash      Medication List       Accurate as of June 16, 2018 10:56 AM. Always use your most recent med list.        acetaminophen 500 MG tablet Commonly known as:  TYLENOL Take 1,000 mg by mouth every 8 (eight) hours as needed for mild pain or moderate pain.   amLODipine 10 MG tablet Commonly known as:  NORVASC Take 1 tablet (10 mg total) by mouth daily.   aspirin 325 MG EC tablet Take 1 tablet (325 mg total) by mouth daily with breakfast.   BD PEN NEEDLE NANO U/F 32G X 4 MM Misc Generic drug:  Insulin Pen Needle USE AS DIRECTED   cetirizine 10 MG tablet Commonly known as:  ZYRTEC Take 10 mg by mouth daily as needed for allergies.   Colesevelam HCl 3.75 g Pack Dissolve 1 packet in unsweetened beverage and take daily with lunch   dapagliflozin propanediol 5 MG Tabs tablet Commonly known as:  FARXIGA Take 5 mg by mouth daily.   diphenhydrAMINE 25 MG tablet Commonly known as:  BENADRYL Take 25 mg by mouth every 6 (six) hours as needed for allergies.   fluticasone 50 MCG/ACT nasal spray Commonly known as:  FLONASE Place 1 spray into both nostrils daily as needed for allergies.   gabapentin 600 MG tablet Commonly known as:  NEURONTIN Take 1 tablet (600 mg total) by mouth 3 (three) times daily.   glucose blood test strip Commonly known as:  ONE TOUCH ULTRA TEST Use as instructed to check blood sugar daily.   levothyroxine 50 MCG tablet Commonly known as:  SYNTHROID, LEVOTHROID Take 1 tablet (50 mcg total) by mouth daily.   MAGNESIUM PO Take 250 mg by mouth 2 (two) times daily.   methocarbamol 500 MG tablet Commonly known as:  ROBAXIN Take 1 tablet (500 mg  total) by mouth every 6 (six) hours as needed for muscle spasms.   NOVOLOG FLEXPEN 100 UNIT/ML FlexPen Generic drug:  insulin aspart INJECT 6 UNITS UNDER THE SKIN BEFORE BREAKFAST, 10 UNITS BEFORE LUNCH, AND 9 UNITS BEFORE SUPPER   oxyCODONE 5 MG immediate release tablet Commonly known as:  Oxy IR/ROXICODONE Take 5 mg by mouth every 4 (four) hours as needed for severe pain.   potassium chloride 10 MEQ tablet Commonly known as:  K-DUR,KLOR-CON Take 1 tablet (10 mEq total) by mouth once for 1 dose.   potassium chloride 10 MEQ tablet Commonly known as:  K-DUR,KLOR-CON Take 10 mEq by mouth 2 (two) times daily.   PROAIR RESPICLICK 998 (90 Base) MCG/ACT Aepb Generic drug:  Albuterol Sulfate Inhale 2 puffs into the lungs 3 (three) times daily as  needed (shortness of breath).   SYMBICORT 80-4.5 MCG/ACT inhaler Generic drug:  budesonide-formoterol Inhale 2 puffs into the lungs 2 (two) times daily as needed (shortness of breath).   TRESIBA FLEXTOUCH 100 UNIT/ML Sopn FlexTouch Pen Generic drug:  insulin degludec INJECT 24 UNITS UNDER THE SKIN ONCE DAILY       No orders of the defined types were placed in this encounter.   Immunization History  Administered Date(s) Administered  . Influenza, High Dose Seasonal PF 01/26/2018  . Influenza,inj,Quad PF,6+ Mos 02/18/2013, 02/09/2014  . Influenza-Unspecified 01/16/2015  . Pneumococcal Conjugate-13 08/29/2014  . Pneumococcal Polysaccharide-23 04/23/2005    Social History   Tobacco Use  . Smoking status: Former Smoker    Packs/day: 0.10    Years: 30.00    Pack years: 3.00    Types: Cigarettes    Last attempt to quit: 2005    Years since quitting: 15.1  . Smokeless tobacco: Never Used  Substance Use Topics  . Alcohol use: Yes    Comment: wine /w dinner on occas.    Family history is   Family History  Problem Relation Age of Onset  . Ovarian cancer Mother   . Cancer - Prostate Father   . Breast cancer Paternal Aunt   .  Multiple myeloma Paternal Aunt   . Anesthesia problems Neg Hx   . Hypotension Neg Hx   . Malignant hyperthermia Neg Hx   . Pseudochol deficiency Neg Hx       Review of Systems  DATA OBTAINED: from patient, nurse GENERAL:  no fevers, fatigue, appetite changes SKIN: No itching, or rash EYES: No eye pain, redness, discharge EARS: No earache, tinnitus, change in hearing NOSE: No congestion, drainage or bleeding  MOUTH/THROAT: No mouth or tooth pain, No sore throat RESPIRATORY: No cough, wheezing, SOB CARDIAC: No chest pain, palpitations, lower extremity edema  GI: No abdominal pain, No N/V/D or constipation, No heartburn or reflux  GU: No dysuria, frequency or urgency, or incontinence  MUSCULOSKELETAL: No unrelieved bone/joint pain NEUROLOGIC: No headache, dizziness or focal weakness PSYCHIATRIC: No c/o anxiety or sadness   Vitals:   06/16/18 1016  BP: 132/69  Pulse: 97  Resp: 18  Temp: 100.3 F (37.9 C)    SpO2 Readings from Last 1 Encounters:  06/15/18 92%   Body mass index is 30.52 kg/m.   Physical Exam  GENERAL APPEARANCE: Alert, conversant,  No acute distress.  SKIN: No diaphoresis rash HEAD: Normocephalic, atraumatic  EYES: Conjunctiva/lids clear. Pupils round, reactive. EOMs intact.  EARS: External exam WNL, canals clear. Hearing grossly normal.  NOSE: No deformity or discharge.  MOUTH/THROAT: Lips w/o lesions  RESPIRATORY: Breathing is even, unlabored. Lung sounds are clear   CARDIOVASCULAR: Heart RRR no murmurs, rubs or gallops. No peripheral edema.   GASTROINTESTINAL: Abdomen is soft, non-tender, not distended w/ normal bowel sounds. GENITOURINARY: Bladder non tender, not distended  MUSCULOSKELETAL: No abnormal joints or musculature NEUROLOGIC:  Cranial nerves 2-12 grossly intact. Moves all extremities  PSYCHIATRIC: Mood and affect appropriate to situation, no behavioral issues  Patient Active Problem List   Diagnosis Date Noted  . Fracture, proximal  femur ([place]) 06/12/2018  . Femur fracture, left ([place]) 06/12/2018  . Acute blood loss as cause of postoperative anemia 03/30/2017  . Diabetic polyneuropathy associated with secondary diabetes mellitus ([place]) 03/30/2017  . Asthma 03/30/2017  . Dyslipidemia 03/26/2017  . Lumbar stenosis with neurogenic claudication 03/17/2017  . Spinal stenosis of lumbar region 03/03/2017  . Myoclonic jerking   .  Nausea   . Acute encephalopathy 07/15/2016  . Lumbar radiculopathy 06/27/2016  . Hypothyroidism 06/26/2016  . Labile blood glucose   . Surgery, elective   . Post-operative pain   . Sickle cell trait ([place])   . Acute blood loss anemia   . History of back surgery   . AKI (acute kidney injury) ([place])   . Herniated nucleus pulposus, L2-3 06/25/2016  . Bilateral primary osteoarthritis of knee 03/26/2016  . Osteoarthritis, hand 03/26/2016  . Other insomnia 03/26/2016  . Memory disorder 02/16/2016  . Fibromyalgia 09/27/2014  . Nocturnal leg cramps 09/27/2014  . Body mass index (BMI) of 30.0-30.9 in adult 08/04/2014  . Neuropathy 03/10/2014  . Allergic rhinitis 03/10/2014  . Osteoporosis 03/10/2014  . Spinal stenosis of lumbar region with radiculopathy 02/28/2014  . Type II or unspecified type diabetes mellitus without mention of complication, uncontrolled 07/08/2013  . Hypokalemia 11/02/2012  . Essential hypertension, benign 11/02/2012  . Diabetes mellitus with neuropathy ([place]) 10/29/2012  . Hyperlipidemia 10/29/2012  . Spondylolisthesis of lumbar region 03/19/2011  . Lumbar radicular pain 03/19/2011      Labs reviewed: Basic Metabolic Panel:    Component Value Date/Time   NA 138 06/15/2018 0513   NA 142 03/29/2017   K 4.4 06/15/2018 0513   CL 106 06/15/2018 0513   CO2 25 06/15/2018 0513   GLUCOSE 190 (H) 06/15/2018 0513   BUN 28 (H) 06/15/2018 0513   BUN 14 03/29/2017   CREATININE 1.19 (H) 06/15/2018 0513   CREATININE 0.91 07/05/2013 1014   CALCIUM 8.5 (L) 06/15/2018 0513   PROT  6.8 04/23/2018 1121   ALBUMIN 4.1 04/23/2018 1121   AST 18 04/23/2018 1121   ALT 12 04/23/2018 1121   ALKPHOS 75 04/23/2018 1121   BILITOT 0.4 04/23/2018 1121   GFRNONAA 44 (L) 06/15/2018 0513   GFRAA 51 (L) 06/15/2018 0513    Recent Labs    06/12/18 1850 06/14/18 0428 06/15/18 0513  NA 138 137 138  K 3.8 4.1 4.4  CL 103 107 106  CO2 '27 23 25  ' GLUCOSE 121* 269* 190*  BUN 27* 29* 28*  CREATININE 0.98 1.40* 1.19*  CALCIUM 9.6 7.8* 8.5*   Liver Function Tests: Recent Labs    08/14/17 1102 01/22/18 1139 04/23/18 1121  AST '16 20 18  ' ALT '13 14 12  ' ALKPHOS 82 64 75  BILITOT 0.5 0.6 0.4  PROT 7.3 6.9 6.8  ALBUMIN 4.0 4.1 4.1   No results for input(s): LIPASE, AMYLASE in the last 8760 hours. No results for input(s): AMMONIA in the last 8760 hours. CBC: Recent Labs    06/12/18 1850 06/14/18 0428 06/15/18 0513  WBC 7.9 6.9 10.0  NEUTROABS 6.6  --   --   HGB 14.8 9.8* 9.9*  HCT 47.7* 32.9* 31.3*  MCV 86.7 90.4 86.0  PLT 282 211 194   Lipid Recent Labs    01/22/18 1139 04/23/18 1121  CHOL 236* 281*  HDL 60.10 56.90  LDLCALC 164* 190*  TRIG 62.0 168.0*    Cardiac Enzymes: No results for input(s): CKTOTAL, CKMB, CKMBINDEX, TROPONINI in the last 8760 hours. BNP: No results for input(s): BNP in the last 8760 hours. Lab Results  Component Value Date   MICROALBUR <0.7 01/13/2017   Lab Results  Component Value Date   HGBA1C 7.3 (H) 04/23/2018   Lab Results  Component Value Date   TSH 3.54 06/09/2018   Lab Results  Component Value Date   VITAMINB12 1,280 (H) 02/16/2016  No results found for: FOLATE No results found for: IRON, TIBC, FERRITIN  Imaging and Procedures obtained prior to SNF admission: Dg C-arm 1-60 Min-no Report  Result Date: 06/13/2018 Fluoroscopy was utilized by the requesting physician.  No radiographic interpretation.   Dg Hip Operative Unilat W Or W/o Pelvis Left  Result Date: 06/13/2018 CLINICAL DATA:  Left femur ORIF. EXAM:  OPERATIVE LEFT  HIP (WITH PELVIS IF PERFORMED) TECHNIQUE: Fluoroscopic spot image(s) were submitted for interpretation post-operatively. FLUOROSCOPY TIME:  2 minutes, 9 seconds. C-arm fluoroscopic images were obtained intraoperatively and submitted for post operative interpretation. COMPARISON:  Left femur x-rays from yesterday. FINDINGS: Multiple intraoperative fluoroscopic images demonstrate interval intramedullary nailing of the left femur, with the proximal diaphyseal fracture now in anatomic alignment. IMPRESSION: Intraoperative fluoroscopic guidance for left femur ORIF. Electronically Signed   By: Titus Dubin M.D.   On: 06/13/2018 14:27   Dg Hip Unilat With Pelvis 2-3 Views Left  Result Date: 06/12/2018 CLINICAL DATA:  Twisted thigh, hip pain EXAM: DG HIP (WITH OR WITHOUT PELVIS) 2-3V LEFT COMPARISON:  None. FINDINGS: Postsurgical changes at the lumbosacral spine. The SI joints are non widened. The pubic symphysis and rami are intact. Both femoral heads project in joint. Acute fracture involving the proximal shaft of the femur at the junction of the proximal and middle thirds. Mild varus angulation of distal fracture fragment. One bone with posterior and close to 1 bone with central displacement of distal fracture fragment. IMPRESSION: Acute displaced and angulated proximal femoral shaft fracture Electronically Signed   By: Donavan Foil M.D.   On: 06/12/2018 19:20   Dg Femur Min 2 Views Left  Result Date: 06/12/2018 CLINICAL DATA:  Fall EXAM: LEFT FEMUR 2 VIEWS COMPARISON:  None. FINDINGS: Acute fracture proximal shaft of the femur at the junction of the proximal and middle thirds. Mild varus angulation of distal fracture fragment. Slightly under 1 bone with of central displacement and 1 bone with posterior displacement of distal fracture fragment. 2.9 cm of overriding. IMPRESSION: Acute displaced, angulated and overriding fracture involving the proximal shaft of the femur Electronically Signed    By: Donavan Foil M.D.   On: 06/12/2018 19:21     Not all labs, radiology exams or other studies done during hospitalization come through on my EPIC note; however they are reviewed by me.    Assessment and Plan  Left femoral fracture- x-ray with acute displaced angulated overriding fracture involving the proximal shaft of left femur; status post antegrade intramedullary nail placement on 2/22; ASA 325 mg daily for VT prophylaxis SNF- admitted for OT/PT; ASA 325 mg daily as prophylaxis with oxycodone and  Robaxin for pain  AKI- creatinine trended up to 1.41, irbesartan was held IV fluids were started; amlodipine was substituted for irbesartan SNF- follow-up BMP  Hypertension SNF-controlled; continue Norvasc 10 mg daily  Insulin-dependent diabetes mellitus 2-A1c 7.3 SNF- continue dapagliflozin 5 mg daily, Tresiba 24 units daily prandial NovoLog 6 with breakfast, 10 with lunch and nine with supper  Hypothyroidism SNF-not stated as uncontrolled; continue Synthroid 50 mcg daily  Diabetic neuropathy SNF- not stated as uncontrolled; continue Neurontin 600 mg 3 times daily      Time spent greater than 45 minutes;> 50% of time with patient was spent reviewing records, labs, tests and studies, counseling and developing plan of care  Webb Silversmith D. Sheppard Coil, MD

## 2018-06-17 ENCOUNTER — Encounter (HOSPITAL_COMMUNITY): Payer: Self-pay | Admitting: Orthopaedic Surgery

## 2018-06-18 ENCOUNTER — Encounter: Payer: Self-pay | Admitting: Internal Medicine

## 2018-06-22 LAB — BASIC METABOLIC PANEL
BUN: 14 (ref 4–21)
Creatinine: 0.7 (ref 0.5–1.1)
Glucose: 121
Potassium: 4.3 (ref 3.4–5.3)
SODIUM: 140 (ref 137–147)

## 2018-06-22 LAB — CBC AND DIFFERENTIAL
HCT: 27 — AB (ref 36–46)
HEMOGLOBIN: 8.9 — AB (ref 12.0–16.0)
Platelets: 320 (ref 150–399)
WBC: 10

## 2018-06-23 ENCOUNTER — Encounter: Payer: Self-pay | Admitting: Internal Medicine

## 2018-06-23 ENCOUNTER — Non-Acute Institutional Stay (SKILLED_NURSING_FACILITY): Payer: Medicare Other | Admitting: Internal Medicine

## 2018-06-23 DIAGNOSIS — S72345D Nondisplaced spiral fracture of shaft of left femur, subsequent encounter for closed fracture with routine healing: Secondary | ICD-10-CM

## 2018-06-23 MED ORDER — OXYCODONE HCL 5 MG PO TABS: 5.0000 mg | ORAL_TABLET | Freq: Four times a day (QID) | ORAL | 0 refills | Status: DC

## 2018-06-23 NOTE — Progress Notes (Signed)
Location:  [facility] of Service:  SNF [phone])  Provider: Hennie Duos MD  Wenda Low, MD  Patient Care Team: Wenda Low, MD as PCP - General (Internal Medicine) Kristeen Miss, MD as Consulting Physician (Neurosurgery) Bo Merino, MD as Consulting Physician (Rheumatology)  Extended Emergency Contact Information Primary Emergency Contact: Patricia,Montgomery Address: [place], [place] [address] Johnnette Litter of [place] Phone: [phone] Relation: Sister Secondary Emergency Contact: Helane Rima [place] of [place] Phone: [phone] Relation: Sister    Allergies: Cymbalta [duloxetine hcl]; Baclofen; Betadine [povidone iodine]; Adhesive [tape]; and Lipitor [atorvastatin]  Chief Complaint  Patient presents with  . Acute Visit    HPI: Patient is 78 y.o. female who is being seen to discuss pain control.  Patient says that she is working with rehab and even if she moves her leg at all she has horrible pain in mid leg.  She does not describes the pain like a muscle spasm.  Past Medical History:  Diagnosis Date  . Acute blood loss anemia   . Acute encephalopathy 07/15/2016  . AKI (acute kidney injury) ([place])   . Anemia    has sickle cell trait  . Anxiety   . Arthritis   . Asthma    has used inhaler in past for asthmatic bronchitis, last time- early 2012  . Bilateral primary osteoarthritis of knee 03/26/2016  . Complication of anesthesia    wakes up shaking  . Diabetes mellitus   . Diabetes mellitus with neuropathy ([place]) 10/29/2012  . Diverticulitis   . Dyslipidemia   . Encephalopathy 06/2016   due to medications after surgery  . Fibromyalgia   . GERD (gastroesophageal reflux disease)    occas. use of  Prilosec  . Heart murmur    sees Dr. Montez Morita, last seen- early 2012  . Herniated nucleus pulposus, L2-3 06/25/2016  . History of back surgery   . Hypertension    02/2010- stress test /w PCP  .  Hypothyroidism   . Loose bowel movements 12/2016  . Lumbar radiculopathy 06/27/2016  . Lumbar stenosis with neurogenic claudication 03/17/2017  . Memory disorder 02/16/2016  . Myoclonic jerking   . Neuromuscular disorder (HCC)    lumbar radiculopathy, lumbago  . Nocturnal leg cramps 09/27/2014  . Osteoporosis 03/10/2014  . Pneumonia   . Sickle cell trait ([place])   . Sleep apnea    borderline sleep apnea, states she no longer uses, early 2012- stopped using   . Spinal stenosis of lumbar region with radiculopathy 02/28/2014  . Spondylolisthesis of lumbar region 03/19/2011  . Type II or unspecified type diabetes mellitus without mention of complication, uncontrolled 07/08/2013    Past Surgical History:  Procedure Laterality Date  . ABDOMINAL HYSTERECTOMY    . adb.cyst     ovarian cyst  . BACK SURGERY     2012, 2015 (3 total)  . BACK SURGERY  2018   02/24/2017  . COLONOSCOPY    . EYE SURGERY     macular degeneration treatment - injections  . FEMUR IM NAIL Left 06/13/2018   Procedure: INTRAMEDULLARY (IM) NAIL FEMORAL;  Surgeon: [name], MD;  Location: [facility];  Service: Orthopedics;  Laterality: Left;  . OVARIAN CYST SURGERY    . POSTERIOR LUMBAR FUSION 4 LEVEL N/A 03/17/2017   Procedure: Decompression of Lumbar One-Two with Thoracic Ten to Lumbar Two Fusion;  Surgeon: Kristeen Miss, MD;  Location: [facility];  Service: Neurosurgery;  Laterality: N/A;  Decompression of L1-2 with T10 to L2 Fusion    Allergies as of 06/23/2018      Reactions   Cymbalta [duloxetine Hcl] Diarrhea, Other (See Comments)   Dizziness, headache, irritability   Baclofen    jerks    Betadine [povidone Iodine] Swelling, Other (See Comments)   SWELLING REACTION DESCRIPTION/SEVERITY UNSPECIFIED  Reaction to betadine eye drops   Adhesive [tape] Rash   Lipitor [atorvastatin] Rash      Medication List       Accurate as of June 23, 2018 11:59 PM. Always use your most recent med list.          acetaminophen 500 MG tablet Commonly known as:  TYLENOL Take 1,000 mg by mouth every 8 (eight) hours as needed for mild pain or moderate pain.   amLODipine 10 MG tablet Commonly known as:  NORVASC Take 1 tablet (10 mg total) by mouth daily.   aspirin 325 MG EC tablet Take 1 tablet (325 mg total) by mouth daily with breakfast.   BD Pen Needle Nano U/F 32G X 4 MM Misc Generic drug:  Insulin Pen Needle USE AS DIRECTED   cetirizine 10 MG tablet Commonly known as:  ZYRTEC Take 10 mg by mouth daily as needed for allergies.   Colesevelam HCl 3.75 g Pack Dissolve 1 packet in unsweetened beverage and take daily with lunch   dapagliflozin propanediol 5 MG Tabs tablet Commonly known as:  Farxiga Take 5 mg by mouth daily.   diphenhydrAMINE 25 MG tablet Commonly known as:  BENADRYL Take 25 mg by mouth every 6 (six) hours as needed for allergies.   fluticasone 50 MCG/ACT nasal spray Commonly known as:  FLONASE Place 1 spray into both nostrils daily as needed for allergies.   gabapentin 600 MG tablet Commonly known as:  Neurontin Take 1 tablet (600 mg total) by mouth 3 (three) times daily.   glucose blood test strip Commonly known as:  ONE TOUCH ULTRA TEST Use as instructed to check blood sugar daily.   levothyroxine 50 MCG tablet Commonly known as:  SYNTHROID, LEVOTHROID Take 1 tablet (50 mcg total) by mouth daily.   MAGNESIUM PO Take 250 mg by mouth 2 (two) times daily.   methocarbamol 500 MG tablet Commonly known as:  ROBAXIN Take 1 tablet (500 mg total) by mouth every 6 (six) hours as needed for muscle spasms.   NovoLOG FlexPen 100 UNIT/ML FlexPen Generic drug:  insulin aspart INJECT 6 UNITS UNDER THE SKIN BEFORE BREAKFAST, 10 UNITS BEFORE LUNCH, AND 9 UNITS BEFORE SUPPER   oxyCODONE 5 MG immediate release tablet Commonly known as:  Oxy IR/ROXICODONE Take 1 tablet (5 mg total) by mouth every 6 (six) hours.   potassium chloride 10 MEQ tablet Commonly known as:   K-DUR,KLOR-CON Take 1 tablet (10 mEq total) by mouth once for 1 dose.   potassium chloride 10 MEQ tablet Commonly known as:  K-DUR,KLOR-CON Take 10 mEq by mouth 2 (two) times daily.   ProAir RespiClick 161 (90 Base) MCG/ACT Aepb Generic drug:  Albuterol Sulfate Inhale 2 puffs into the lungs 3 (three) times daily as needed (shortness of breath).   Symbicort 80-4.5 MCG/ACT inhaler Generic drug:  budesonide-formoterol Inhale 2 puffs into the lungs 2 (two) times daily as needed (shortness of breath).   Tyler Aas FlexTouch 100 UNIT/ML Sopn FlexTouch Pen Generic drug:  insulin degludec INJECT 24 UNITS UNDER THE SKIN ONCE DAILY  Meds ordered this encounter  Medications  . oxyCODONE (OXY IR/ROXICODONE) 5 MG immediate release tablet    Sig: Take 1 tablet (5 mg total) by mouth every 6 (six) hours.    Dispense:  56 tablet    Refill:  0    Immunization History  Administered Date(s) Administered  . Influenza, High Dose Seasonal PF 01/26/2018  . Influenza,inj,Quad PF,6+ Mos 02/18/2013, 02/09/2014  . Influenza-Unspecified 01/16/2015  . Pneumococcal Conjugate-13 08/29/2014  . Pneumococcal Polysaccharide-23 04/23/2005    Social History   Tobacco Use  . Smoking status: Former Smoker    Packs/day: 0.10    Years: 30.00    Pack years: 3.00    Types: Cigarettes    Last attempt to quit: 2005    Years since quitting: 15.1  . Smokeless tobacco: Never Used  Substance Use Topics  . Alcohol use: Yes    Comment: wine /w dinner on occas.    Review of Systems  DATA OBTAINED: from patient GENERAL:  no fevers, fatigue, appetite changes SKIN: No itching, rash HEENT: No complaint RESPIRATORY: No cough, wheezing, SOB CARDIAC: No chest pain, palpitations, lower extremity edema  GI: No abdominal pain, No N/V/D or constipation, No heartburn or reflux  GU: No dysuria, frequency or urgency, or incontinence  MUSCULOSKELETAL: + unrelieved bone/joint pain NEUROLOGIC: No headache, dizziness    PSYCHIATRIC: No overt anxiety or sadness  Vitals:   06/27/18 1245  BP: 132/79  Pulse: 72  Resp: 20  Temp: 97.6 F (36.4 C)   There is no height or weight on file to calculate BMI. Physical Exam  GENERAL APPEARANCE: Alert, conversant, appears mildly uncomfortable SKIN: No diaphoresis rash HEENT: Unremarkable RESPIRATORY: Breathing is even, unlabored. Lung sounds are clear   CARDIOVASCULAR: Heart RRR no murmurs, rubs or gallops. No peripheral edema  GASTROINTESTINAL: Abdomen is soft, non-tender, not distended w/ normal bowel sounds.  GENITOURINARY: Bladder non tender, not distended  MUSCULOSKELETAL: No abnormal joints or musculature NEUROLOGIC: Cranial nerves 2-12 grossly intact. Moves all extremities PSYCHIATRIC: Mood and affect appropriate to situation, no behavioral issues  Patient Active Problem List   Diagnosis Date Noted  . Fracture, proximal femur ([place]) 06/12/2018  . Femur fracture, left ([place]) 06/12/2018  . Acute blood loss as cause of postoperative anemia 03/30/2017  . Diabetic polyneuropathy associated with secondary diabetes mellitus ([place]) 03/30/2017  . Asthma 03/30/2017  . Dyslipidemia 03/26/2017  . Lumbar stenosis with neurogenic claudication 03/17/2017  . Spinal stenosis of lumbar region 03/03/2017  . Myoclonic jerking   . Nausea   . Acute encephalopathy 07/15/2016  . Lumbar radiculopathy 06/27/2016  . Hypothyroidism 06/26/2016  . Labile blood glucose   . Surgery, elective   . Post-operative pain   . Sickle cell trait ([place])   . Acute blood loss anemia   . History of back surgery   . AKI (acute kidney injury) ([place])   . Herniated nucleus pulposus, L2-3 06/25/2016  . Bilateral primary osteoarthritis of knee 03/26/2016  . Osteoarthritis, hand 03/26/2016  . Other insomnia 03/26/2016  . Memory disorder 02/16/2016  . Fibromyalgia 09/27/2014  . Nocturnal leg cramps 09/27/2014  . Body mass index (BMI) of 30.0-30.9 in adult 08/04/2014  . Neuropathy 03/10/2014   . Allergic rhinitis 03/10/2014  . Osteoporosis 03/10/2014  . Spinal stenosis of lumbar region with radiculopathy 02/28/2014  . Type 2 diabetes mellitus with complication, with long-term current use of insulin ([place]) 07/08/2013  . Hypokalemia 11/02/2012  . Essential hypertension, benign 11/02/2012  . Diabetes mellitus with neuropathy ([place])  10/29/2012  . Hyperlipidemia 10/29/2012  . Spondylolisthesis of lumbar region 03/19/2011  . Lumbar radicular pain 03/19/2011    CMP     Component Value Date/Time   NA 140 06/22/2018   K 4.3 06/22/2018   CL 106 06/15/2018 0513   CO2 25 06/15/2018 0513   GLUCOSE 190 (H) 06/15/2018 0513   BUN 14 06/22/2018   CREATININE 0.7 06/22/2018   CREATININE 1.19 (H) 06/15/2018 0513   CREATININE 0.91 07/05/2013 1014   CALCIUM 8.5 (L) 06/15/2018 0513   PROT 6.8 04/23/2018 1121   ALBUMIN 4.1 04/23/2018 1121   AST 18 04/23/2018 1121   ALT 12 04/23/2018 1121   ALKPHOS 75 04/23/2018 1121   BILITOT 0.4 04/23/2018 1121   GFRNONAA 44 (L) 06/15/2018 0513   GFRAA 51 (L) 06/15/2018 0513   Recent Labs    06/12/18 1850 06/14/18 0428 06/15/18 0513 06/22/18  NA 138 137 138 140  K 3.8 4.1 4.4 4.3  CL 103 107 106  --   CO2 27 23 25   --   GLUCOSE 121* 269* 190*  --   BUN 27* 29* 28* 14  CREATININE 0.98 1.40* 1.19* 0.7  CALCIUM 9.6 7.8* 8.5*  --    Recent Labs    08/14/17 1102 01/22/18 1139 04/23/18 1121  AST 16 20 18   ALT 13 14 12   ALKPHOS 82 64 75  BILITOT 0.5 0.6 0.4  PROT 7.3 6.9 6.8  ALBUMIN 4.0 4.1 4.1   Recent Labs    06/12/18 1850 06/14/18 0428 06/15/18 0513 06/22/18  WBC 7.9 6.9 10.0 10.0  NEUTROABS 6.6  --   --   --   HGB 14.8 9.8* 9.9* 8.9*  HCT 47.7* 32.9* 31.3* 27*  MCV 86.7 90.4 86.0  --   PLT 282 211 194 320   Recent Labs    01/22/18 1139 04/23/18 1121  CHOL 236* 281*  LDLCALC 164* 190*  TRIG 62.0 168.0*   Lab Results  Component Value Date   MICROALBUR <0.7 01/13/2017   Lab Results  Component Value Date   TSH 3.54  06/09/2018   Lab Results  Component Value Date   HGBA1C 7.3 (H) 04/23/2018   Lab Results  Component Value Date   CHOL 281 (H) 04/23/2018   HDL 56.90 04/23/2018   LDLCALC 190 (H) 04/23/2018   LDLDIRECT 115.0 12/18/2016   TRIG 168.0 (H) 04/23/2018   CHOLHDL 5 04/23/2018    Significant Diagnostic Results in last 30 days:  Dg C-arm 1-60 Min-no Report  Result Date: 06/13/2018 Fluoroscopy was utilized by the requesting physician.  No radiographic interpretation.   Dg Hip Operative Unilat W Or W/o Pelvis Left  Result Date: 06/13/2018 CLINICAL DATA:  Left femur ORIF. EXAM: OPERATIVE LEFT  HIP (WITH PELVIS IF PERFORMED) TECHNIQUE: Fluoroscopic spot image(s) were submitted for interpretation post-operatively. FLUOROSCOPY TIME:  2 minutes, 9 seconds. C-arm fluoroscopic images were obtained intraoperatively and submitted for post operative interpretation. COMPARISON:  Left femur x-rays from yesterday. FINDINGS: Multiple intraoperative fluoroscopic images demonstrate interval intramedullary nailing of the left femur, with the proximal diaphyseal fracture now in anatomic alignment. IMPRESSION: Intraoperative fluoroscopic guidance for left femur ORIF. Electronically Signed   By: Titus Dubin M.D.   On: 06/13/2018 14:27   Dg Hip Unilat With Pelvis 2-3 Views Left  Result Date: 06/12/2018 CLINICAL DATA:  Twisted thigh, hip pain EXAM: DG HIP (WITH OR WITHOUT PELVIS) 2-3V LEFT COMPARISON:  None. FINDINGS: Postsurgical changes at the lumbosacral spine. The SI joints are non widened.  The pubic symphysis and rami are intact. Both femoral heads project in joint. Acute fracture involving the proximal shaft of the femur at the junction of the proximal and middle thirds. Mild varus angulation of distal fracture fragment. One bone with posterior and close to 1 bone with central displacement of distal fracture fragment. IMPRESSION: Acute displaced and angulated proximal femoral shaft fracture Electronically Signed    By: Donavan Foil M.D.   On: 06/12/2018 19:20   Dg Femur Min 2 Views Left  Result Date: 06/12/2018 CLINICAL DATA:  Fall EXAM: LEFT FEMUR 2 VIEWS COMPARISON:  None. FINDINGS: Acute fracture proximal shaft of the femur at the junction of the proximal and middle thirds. Mild varus angulation of distal fracture fragment. Slightly under 1 bone with of central displacement and 1 bone with posterior displacement of distal fracture fragment. 2.9 cm of overriding. IMPRESSION: Acute displaced, angulated and overriding fracture involving the proximal shaft of the femur Electronically Signed   By: Donavan Foil M.D.   On: 06/12/2018 19:21   Xr Femur Min 2 Views Left  Result Date: 06/25/2018 2 views of the left femur show a femoral rod transfixing a midshaft femur fracture that is an oblique fracture.  There is no complicating features of the hardware.  The fracture line is still visible.  There is no pathologic features of the bone.   Assessment and Plan  Midshaft femur fracture- will be changing patient's pain medicine to oxycodone 5 mg every 6 scheduled.  Patient is 2 weeks out is working with rehab well and this is a big fracture.  I discussed the problems with narcotic medication as far as addiction with patient and she understands.  We will continue to monitor      Inocencio Homes, MD

## 2018-06-25 ENCOUNTER — Ambulatory Visit (INDEPENDENT_AMBULATORY_CARE_PROVIDER_SITE_OTHER): Payer: No Typology Code available for payment source

## 2018-06-25 ENCOUNTER — Ambulatory Visit (INDEPENDENT_AMBULATORY_CARE_PROVIDER_SITE_OTHER): Payer: Medicare Other | Admitting: Orthopaedic Surgery

## 2018-06-25 ENCOUNTER — Other Ambulatory Visit: Payer: Self-pay | Admitting: *Deleted

## 2018-06-25 ENCOUNTER — Encounter (INDEPENDENT_AMBULATORY_CARE_PROVIDER_SITE_OTHER): Payer: Self-pay | Admitting: Orthopaedic Surgery

## 2018-06-25 DIAGNOSIS — S72002D Fracture of unspecified part of neck of left femur, subsequent encounter for closed fracture with routine healing: Secondary | ICD-10-CM | POA: Diagnosis not present

## 2018-06-25 NOTE — Progress Notes (Signed)
The patient is a 78 year old female who is 2 weeks status post fixation of a midshaft femur fracture.  She sustained this fracture after a hard mechanical fall.  She feels like that there was a pop that happened before she fell and the brake actually occurred before she fell.  We did not find any pathologic features of this fracture and she does not anyone is been sick or has any evidence of cancer.  She has been convalescing in a nursing facility.  She is doing well overall.  On exam she easily lets me put her hip through flexion extension and bend her knee back and forth on the left side where we placed the rod.  The staples been removed and Steri-Strips applied from all her incisions and they will look good.  2 views of the left femur obtained and show an intact intramedullary nail and intact hardware.  The fracture line in the midshaft of the femur is oblique and shows no pathologic features.  There is no interval healing as of yet but it is only been 2 weeks.  At this point gave her reassurance that she continue attempts at weightbearing as tolerated and mobility.  We will see her back in 4 weeks with repeat 2 views of the left femur.

## 2018-06-26 ENCOUNTER — Telehealth (INDEPENDENT_AMBULATORY_CARE_PROVIDER_SITE_OTHER): Payer: Self-pay | Admitting: Orthopaedic Surgery

## 2018-06-26 NOTE — Telephone Encounter (Signed)
Ice, elevation, and compressive hose.

## 2018-06-26 NOTE — Telephone Encounter (Signed)
See below Ice and elevate?

## 2018-06-26 NOTE — Telephone Encounter (Signed)
Patient aware of the below message She's still in inpatient rehab so she will ask them for this

## 2018-06-26 NOTE — Telephone Encounter (Signed)
Patient called advised she forgot to ask Dr Ninfa Linden what can she do about the swelling in her leg where she had the surgery. The number to contact patient is [phone]

## 2018-06-27 ENCOUNTER — Encounter: Payer: Self-pay | Admitting: Internal Medicine

## 2018-06-29 NOTE — Patient Outreach (Signed)
[facility]) Care Management  06/29/2018  [name] February 28, 1941 [id number]   Facility site visit to [employer]. Collaboration with [place] concerning patient's progress, discharge plan and potential care management needs. Patient admitted to SNF on  06/16/18 after a hospitalization for Closed fracture of proximal end of left femur after a fall. Planned discharge date is not set and discharge disposition is planned for home .  Patient will have [place] at discharge. Patient was evaluated for community based chronic disease management services with [facility] care Management Program as a benefit of patient's Next Gen Medicare.   Attempted to see patient at the bedside but patient was out for an appointment.  Checked with nurses station to see when patient expected back, made aware it would be at the end of the day.   Will make [facility] RN aware was unable to assess for [facility] CM needs.   For questions please contact:   Sayer Masini RN, [facility] Liaison  [phone]) Business Mobile [phone]) Toll free office

## 2018-07-02 ENCOUNTER — Non-Acute Institutional Stay (SKILLED_NURSING_FACILITY): Payer: Medicare Other | Admitting: Internal Medicine

## 2018-07-02 ENCOUNTER — Encounter: Payer: Self-pay | Admitting: Internal Medicine

## 2018-07-02 DIAGNOSIS — E1140 Type 2 diabetes mellitus with diabetic neuropathy, unspecified: Secondary | ICD-10-CM

## 2018-07-02 DIAGNOSIS — I10 Essential (primary) hypertension: Secondary | ICD-10-CM

## 2018-07-02 DIAGNOSIS — Z794 Long term (current) use of insulin: Secondary | ICD-10-CM | POA: Diagnosis not present

## 2018-07-02 DIAGNOSIS — G629 Polyneuropathy, unspecified: Secondary | ICD-10-CM | POA: Diagnosis not present

## 2018-07-02 DIAGNOSIS — N179 Acute kidney failure, unspecified: Secondary | ICD-10-CM | POA: Diagnosis not present

## 2018-07-02 DIAGNOSIS — S72002D Fracture of unspecified part of neck of left femur, subsequent encounter for closed fracture with routine healing: Secondary | ICD-10-CM | POA: Diagnosis not present

## 2018-07-02 DIAGNOSIS — E039 Hypothyroidism, unspecified: Secondary | ICD-10-CM | POA: Diagnosis not present

## 2018-07-02 NOTE — Progress Notes (Signed)
Location:  [place] Room Number: 440H Place of Service:  SNF (31)  Noah Delaine. Sheppard Coil, MD  Patient Care Team: Wenda Low, MD as PCP - General (Internal Medicine) Kristeen Miss, MD as Consulting Physician (Neurosurgery) Bo Merino, MD as Consulting Physician (Rheumatology)  Extended Emergency Contact Information Primary Emergency Contact: Patricia,Montgomery Address: [place], [place] [address] Johnnette Litter of [place] Phone: [phone] Relation: Sister Secondary Emergency Contact: Helane Rima [place] of [place] Phone: [phone] Relation: Sister  Allergies  Allergen Reactions   Cymbalta [Duloxetine Hcl] Diarrhea and Other (See Comments)    Dizziness, headache, irritability   Baclofen     jerks    Betadine [Povidone Iodine] Swelling and Other (See Comments)    SWELLING REACTION DESCRIPTION/SEVERITY UNSPECIFIED  Reaction to betadine eye drops   Adhesive [Tape] Rash   Lipitor [Atorvastatin] Rash    Chief Complaint  Patient presents with   Discharge Note    Discharge from [facility]    HPI:  78 y.o. female with well-controlled insulin-dependent type 2 diabetes, osteoporosis, hypertension, spinal stenosis, chronic pain, asthma, CKD, who presented to the emergency department after tripping and falling at a gas station.  Patient fell on her left side and had immediate pain and inability to ambulate.  Patient was admitted to [facility] long hospital from 2/20 1-24 where she underwent intramedullary nail placement on 2/22.  Postop course was complicated by acute kidney injury that improved with IV fluids.  Patient was admitted to skilled nursing facility for OT/PT and is now ready to be discharged home.    Past Medical History:  Diagnosis Date   Acute blood loss anemia    Acute encephalopathy 07/15/2016   AKI (acute kidney injury) ([place])    Anemia    has sickle cell trait   Anxiety     Arthritis    Asthma    has used inhaler in past for asthmatic bronchitis, last time- early 2012   Bilateral primary osteoarthritis of knee 60/09/3014   Complication of anesthesia    wakes up shaking   Diabetes mellitus    Diabetes mellitus with neuropathy ([place]) 10/29/2012   Diverticulitis    Dyslipidemia    Encephalopathy 06/2016   due to medications after surgery   Fibromyalgia    GERD (gastroesophageal reflux disease)    occas. use of  Prilosec   Heart murmur    sees Dr. Montez Morita, last seen- early 2012   Herniated nucleus pulposus, L2-3 06/25/2016   History of back surgery    Hypertension    02/2010- stress test /w PCP   Hypothyroidism    Loose bowel movements 12/2016   Lumbar radiculopathy 06/27/2016   Lumbar stenosis with neurogenic claudication 03/17/2017   Memory disorder 02/16/2016   Myoclonic jerking    Neuromuscular disorder (HCC)    lumbar radiculopathy, lumbago   Nocturnal leg cramps 09/27/2014   Osteoporosis 03/10/2014   Pneumonia    Sickle cell trait ([place])    Sleep apnea    borderline sleep apnea, states she no longer uses, early 2012- stopped using    Spinal stenosis of lumbar region with radiculopathy 02/28/2014   Spondylolisthesis of lumbar region 03/19/2011   Type II or unspecified type diabetes mellitus without mention of complication, uncontrolled 07/08/2013    Past Surgical History:  Procedure Laterality Date   ABDOMINAL HYSTERECTOMY     adb.cyst     ovarian  cyst   BACK SURGERY     2012, 2015 (3 total)   BACK SURGERY  2018   02/24/2017   COLONOSCOPY     EYE SURGERY     macular degeneration treatment - injections   FEMUR IM NAIL Left 06/13/2018   Procedure: INTRAMEDULLARY (IM) NAIL FEMORAL;  Surgeon: [name], MD;  Location: [facility];  Service: Orthopedics;  Laterality: Left;   OVARIAN CYST SURGERY     POSTERIOR LUMBAR FUSION 4 LEVEL N/A 03/17/2017   Procedure: Decompression of Lumbar One-Two with  Thoracic Ten to Lumbar Two Fusion;  Surgeon: Kristeen Miss, MD;  Location: [place];  Service: Neurosurgery;  Laterality: N/A;  Decompression of L1-2 with T10 to L2 Fusion     reports that she quit smoking about 15 years ago. Her smoking use included cigarettes. She has a 3.00 pack-year smoking history. She has never used smokeless tobacco. She reports current alcohol use. She reports that she does not use drugs. Social History   Socioeconomic History   Marital status: Divorced    Spouse name: Not on file   Number of children: 1   Years of education: Doctorate   Highest education level: Not on file  Occupational History   Occupation: Retired  Scientist, product/process development strain: Not on file   Food insecurity:    Worry: Not on file    Inability: Not on Lexicographer needs:    Medical: Not on file    Non-medical: Not on file  Tobacco Use   Smoking status: Former Smoker    Packs/day: 0.10    Years: 30.00    Pack years: 3.00    Types: Cigarettes    Last attempt to quit: 2005    Years since quitting: 15.2   Smokeless tobacco: Never Used  Substance and Sexual Activity   Alcohol use: Yes    Comment: wine /w dinner on occas.   Drug use: Never   Sexual activity: Never  Lifestyle   Physical activity:    Days per week: Not on file    Minutes per session: Not on file   Stress: Not on file  Relationships   Social connections:    Talks on phone: Not on file    Gets together: Not on file    Attends religious service: Not on file    Active member of club or organization: Not on file    Attends meetings of clubs or organizations: Not on file    Relationship status: Not on file   Intimate partner violence:    Fear of current or ex partner: Not on file    Emotionally abused: Not on file    Physically abused: Not on file    Forced sexual activity: Not on file  Other Topics Concern   Not on file  Social History Narrative   Patient drinks caffeine  occasionally.   Patient is right handed.   Admitted to [facility] 03/21/17   Divorced    Former smoker - stopped 2000    Alcohol - occasionally wine at dinner   Full code    Pertinent  Health Maintenance Due  Topic Date Due   OPHTHALMOLOGY EXAM  12/16/1950   DEXA SCAN  12/16/2004   FOOT EXAM  05/03/2015   URINE MICROALBUMIN  01/13/2018   HEMOGLOBIN A1C  10/22/2018   INFLUENZA VACCINE  Completed   PNA vac Low Risk Adult  Completed    Medications: Allergies  as of 07/02/2018      Reactions   Cymbalta [duloxetine Hcl] Diarrhea, Other (See Comments)   Dizziness, headache, irritability   Baclofen    jerks    Betadine [povidone Iodine] Swelling, Other (See Comments)   SWELLING REACTION DESCRIPTION/SEVERITY UNSPECIFIED  Reaction to betadine eye drops   Adhesive [tape] Rash   Lipitor [atorvastatin] Rash      Medication List       Accurate as of July 02, 2018 11:59 PM. Always use your most recent med list.        acetaminophen 500 MG tablet Commonly known as:  TYLENOL Take 1,000 mg by mouth every 8 (eight) hours as needed for mild pain or moderate pain.   amLODipine 10 MG tablet Commonly known as:  NORVASC Take 1 tablet (10 mg total) by mouth daily.   aspirin 325 MG EC tablet Take 1 tablet (325 mg total) by mouth daily with breakfast.   cetirizine 10 MG tablet Commonly known as:  ZYRTEC Take 1 tablet (10 mg total) by mouth daily as needed for allergies.   Colesevelam HCl 3.75 g Pack Dissolve 1 packet in unsweetened beverage and take daily with lunch   dapagliflozin propanediol 5 MG Tabs tablet Commonly known as:  Farxiga Take 5 mg by mouth daily.   fluticasone 50 MCG/ACT nasal spray Commonly known as:  FLONASE Place 1 spray into both nostrils daily as needed for allergies.   gabapentin 600 MG tablet Commonly known as:  Neurontin Take 1 tablet (600 mg total) by mouth 3 (three) times daily.   glucose blood test strip Commonly known  as:  ONE TOUCH ULTRA TEST Use as instructed to check blood sugar daily.   insulin aspart 100 UNIT/ML FlexPen Commonly known as:  NovoLOG FlexPen INJECT 6 UNITS UNDER THE SKIN BEFORE BREAKFAST, 10 UNITS BEFORE LUNCH, AND 9 UNITS BEFORE SUPPER   insulin degludec 100 UNIT/ML Sopn FlexTouch Pen Commonly known as:  Tyler Aas FlexTouch INJECT 24 UNITS UNDER THE SKIN ONCE DAILY   Insulin Pen Needle 32G X 4 MM Misc Commonly known as:  BD Pen Needle Nano U/F USE AS DIRECTED   levothyroxine 50 MCG tablet Commonly known as:  SYNTHROID, LEVOTHROID Take 1 tablet (50 mcg total) by mouth daily.   MAGNESIUM PO Take 250 mg by mouth 2 (two) times daily.   methocarbamol 500 MG tablet Commonly known as:  ROBAXIN Take 1 tablet (500 mg total) by mouth every 6 (six) hours as needed for muscle spasms.   oxyCODONE 5 MG immediate release tablet Commonly known as:  Oxy IR/ROXICODONE Take 1 tablet (5 mg total) by mouth every 6 (six) hours as needed for severe pain.   potassium chloride 10 MEQ tablet Commonly known as:  K-DUR,KLOR-CON Take 1 tablet (10 mEq total) by mouth 2 (two) times daily.   ProAir RespiClick 431 (90 Base) MCG/ACT Aepb Generic drug:  Albuterol Sulfate Inhale 2 puffs into the lungs 3 (three) times daily as needed (shortness of breath).   Symbicort 80-4.5 MCG/ACT inhaler Generic drug:  budesonide-formoterol Inhale 2 puffs into the lungs 2 (two) times daily as needed (shortness of breath).        Vitals:   07/02/18 1226  BP: 130/72  Pulse: 80  Resp: 18  Temp: 98.3 F (36.8 C)  Weight: 183 lb (83 kg)  Height: 5\' 5"  (1.651 m)   Body mass index is 30.45 kg/m.  Physical Exam  GENERAL APPEARANCE: Alert, conversant. No acute distress.  HEENT: Unremarkable. RESPIRATORY: Breathing  is even, unlabored. Lung sounds are clear   CARDIOVASCULAR: Heart RRR no murmurs, rubs or gallops. No peripheral edema.  GASTROINTESTINAL: Abdomen is soft, non-tender, not distended w/ normal  bowel sounds.  NEUROLOGIC: Cranial nerves 2-12 grossly intact. Moves all extremities   Labs reviewed: Basic Metabolic Panel: Recent Labs    06/12/18 1850 06/14/18 0428 06/15/18 0513 06/22/18  NA 138 137 138 140  K 3.8 4.1 4.4 4.3  CL 103 107 106  --   CO2 27 23 25   --   GLUCOSE 121* 269* 190*  --   BUN 27* 29* 28* 14  CREATININE 0.98 1.40* 1.19* 0.7  CALCIUM 9.6 7.8* 8.5*  --    Lab Results  Component Value Date   MICROALBUR <0.7 01/13/2017   Liver Function Tests: Recent Labs    08/14/17 1102 01/22/18 1139 04/23/18 1121  AST 16 20 18   ALT 13 14 12   ALKPHOS 82 64 75  BILITOT 0.5 0.6 0.4  PROT 7.3 6.9 6.8  ALBUMIN 4.0 4.1 4.1   No results for input(s): LIPASE, AMYLASE in the last 8760 hours. No results for input(s): AMMONIA in the last 8760 hours. CBC: Recent Labs    06/12/18 1850 06/14/18 0428 06/15/18 0513 06/22/18  WBC 7.9 6.9 10.0 10.0  NEUTROABS 6.6  --   --   --   HGB 14.8 9.8* 9.9* 8.9*  HCT 47.7* 32.9* 31.3* 27*  MCV 86.7 90.4 86.0  --   PLT 282 211 194 320   Lipid Recent Labs    01/22/18 1139 04/23/18 1121  CHOL 236* 281*  HDL 60.10 56.90  LDLCALC 164* 190*  TRIG 62.0 168.0*   Cardiac Enzymes: No results for input(s): CKTOTAL, CKMB, CKMBINDEX, TROPONINI in the last 8760 hours. BNP: No results for input(s): BNP in the last 8760 hours. CBG: Recent Labs    06/14/18 2103 06/15/18 0743 06/15/18 1247  GLUCAP 150* 168* 299*    Procedures and Imaging Studies During Stay: Dg C-arm 1-60 Min-no Report  Result Date: 06/13/2018 Fluoroscopy was utilized by the requesting physician.  No radiographic interpretation.   Dg Hip Operative Unilat W Or W/o Pelvis Left  Result Date: 06/13/2018 CLINICAL DATA:  Left femur ORIF. EXAM: OPERATIVE LEFT  HIP (WITH PELVIS IF PERFORMED) TECHNIQUE: Fluoroscopic spot image(s) were submitted for interpretation post-operatively. FLUOROSCOPY TIME:  2 minutes, 9 seconds. C-arm fluoroscopic images were obtained  intraoperatively and submitted for post operative interpretation. COMPARISON:  Left femur x-rays from yesterday. FINDINGS: Multiple intraoperative fluoroscopic images demonstrate interval intramedullary nailing of the left femur, with the proximal diaphyseal fracture now in anatomic alignment. IMPRESSION: Intraoperative fluoroscopic guidance for left femur ORIF. Electronically Signed   By: Titus Dubin M.D.   On: 06/13/2018 14:27   Dg Hip Unilat With Pelvis 2-3 Views Left  Result Date: 06/12/2018 CLINICAL DATA:  Twisted thigh, hip pain EXAM: DG HIP (WITH OR WITHOUT PELVIS) 2-3V LEFT COMPARISON:  None. FINDINGS: Postsurgical changes at the lumbosacral spine. The SI joints are non widened. The pubic symphysis and rami are intact. Both femoral heads project in joint. Acute fracture involving the proximal shaft of the femur at the junction of the proximal and middle thirds. Mild varus angulation of distal fracture fragment. One bone with posterior and close to 1 bone with central displacement of distal fracture fragment. IMPRESSION: Acute displaced and angulated proximal femoral shaft fracture Electronically Signed   By: Donavan Foil M.D.   On: 06/12/2018 19:20   Dg Femur Min 2 Views Left  Result Date: 06/12/2018 CLINICAL DATA:  Fall EXAM: LEFT FEMUR 2 VIEWS COMPARISON:  None. FINDINGS: Acute fracture proximal shaft of the femur at the junction of the proximal and middle thirds. Mild varus angulation of distal fracture fragment. Slightly under 1 bone with of central displacement and 1 bone with posterior displacement of distal fracture fragment. 2.9 cm of overriding. IMPRESSION: Acute displaced, angulated and overriding fracture involving the proximal shaft of the femur Electronically Signed   By: Donavan Foil M.D.   On: 06/12/2018 19:21   Xr Femur Min 2 Views Left  Result Date: 06/25/2018 2 views of the left femur show a femoral rod transfixing a midshaft femur fracture that is an oblique fracture.   There is no complicating features of the hardware.  The fracture line is still visible.  There is no pathologic features of the bone.   Assessment/Plan:   Closed fracture of proximal end of left femur with routine healing, subsequent encounter  AKI (acute kidney injury) ([place])  Type 2 diabetes mellitus with diabetic neuropathy, with long-term current use of insulin (HCC)  Essential hypertension, benign  Neuropathy  Hypothyroidism, unspecified type   Patient is being discharged with the following home health services: OT/PT/nursing  Patient is being discharged with the following durable medical equipment: None  Patient has been advised to f/u with their PCP in 1-2 weeks to bring them up to date on their rehab stay.  Social services at facility was responsible for arranging this appointment.  Pt was provided with a 30 day supply of prescriptions for medications and refills must be obtained from their PCP.  For controlled substances, a more limited supply may be provided adequate until PCP appointment only.  Medications have been reconciled.  Time spent greater than 30 minutes;> 50% of time with patient was spent reviewing records, labs, tests and studies, counseling and developing plan of care  Noah Delaine. Sheppard Coil, MD

## 2018-07-03 ENCOUNTER — Other Ambulatory Visit: Payer: Self-pay | Admitting: Internal Medicine

## 2018-07-03 ENCOUNTER — Encounter: Payer: Self-pay | Admitting: Internal Medicine

## 2018-07-03 MED ORDER — DAPAGLIFLOZIN PROPANEDIOL 5 MG PO TABS: 5.0000 mg | ORAL_TABLET | Freq: Every day | ORAL | 0 refills | Status: DC

## 2018-07-03 MED ORDER — LEVOTHYROXINE SODIUM 50 MCG PO TABS: 50.0000 ug | ORAL_TABLET | Freq: Every day | ORAL | 0 refills | Status: DC

## 2018-07-03 MED ORDER — GABAPENTIN 600 MG PO TABS: 600.0000 mg | ORAL_TABLET | Freq: Three times a day (TID) | ORAL | 0 refills | Status: DC

## 2018-07-03 MED ORDER — CETIRIZINE HCL 10 MG PO TABS: 10.0000 mg | ORAL_TABLET | Freq: Every day | ORAL | 0 refills | Status: AC | PRN

## 2018-07-03 MED ORDER — OXYCODONE HCL 5 MG PO TABS: 5.0000 mg | ORAL_TABLET | Freq: Four times a day (QID) | ORAL | 0 refills | Status: DC | PRN

## 2018-07-03 MED ORDER — INSULIN PEN NEEDLE 32G X 4 MM MISC: 0 refills | Status: DC

## 2018-07-03 MED ORDER — AMLODIPINE BESYLATE 10 MG PO TABS: 10.0000 mg | ORAL_TABLET | Freq: Every day | ORAL | 0 refills | Status: DC

## 2018-07-03 MED ORDER — SYMBICORT 80-4.5 MCG/ACT IN AERO: 2.0000 | INHALATION_SPRAY | Freq: Two times a day (BID) | RESPIRATORY_TRACT | 0 refills | Status: AC | PRN

## 2018-07-03 MED ORDER — FLUTICASONE PROPIONATE 50 MCG/ACT NA SUSP: 1.0000 | Freq: Every day | NASAL | 0 refills | Status: AC | PRN

## 2018-07-03 MED ORDER — PROAIR RESPICLICK 108 (90 BASE) MCG/ACT IN AEPB: 2.0000 | INHALATION_SPRAY | Freq: Three times a day (TID) | RESPIRATORY_TRACT | 0 refills | Status: DC | PRN

## 2018-07-03 MED ORDER — GLUCOSE BLOOD VI STRP: ORAL_STRIP | 0 refills | Status: DC

## 2018-07-03 MED ORDER — INSULIN ASPART 100 UNIT/ML FLEXPEN: PEN_INJECTOR | SUBCUTANEOUS | 0 refills | Status: DC

## 2018-07-03 MED ORDER — COLESEVELAM HCL 3.75 G PO PACK: PACK | ORAL | 0 refills | Status: DC

## 2018-07-03 MED ORDER — POTASSIUM CHLORIDE CRYS ER 10 MEQ PO TBCR: 10.0000 meq | EXTENDED_RELEASE_TABLET | Freq: Two times a day (BID) | ORAL | 0 refills | Status: DC

## 2018-07-03 MED ORDER — METHOCARBAMOL 500 MG PO TABS: 500.0000 mg | ORAL_TABLET | Freq: Four times a day (QID) | ORAL | 0 refills | Status: DC | PRN

## 2018-07-03 MED ORDER — INSULIN DEGLUDEC 100 UNIT/ML ~~LOC~~ SOPN: PEN_INJECTOR | SUBCUTANEOUS | 0 refills | Status: DC

## 2018-07-05 DIAGNOSIS — Z794 Long term (current) use of insulin: Secondary | ICD-10-CM | POA: Diagnosis not present

## 2018-07-05 DIAGNOSIS — S72002D Fracture of unspecified part of neck of left femur, subsequent encounter for closed fracture with routine healing: Secondary | ICD-10-CM | POA: Diagnosis not present

## 2018-07-05 DIAGNOSIS — W19XXXD Unspecified fall, subsequent encounter: Secondary | ICD-10-CM | POA: Diagnosis not present

## 2018-07-05 DIAGNOSIS — Z7982 Long term (current) use of aspirin: Secondary | ICD-10-CM | POA: Diagnosis not present

## 2018-07-05 DIAGNOSIS — I10 Essential (primary) hypertension: Secondary | ICD-10-CM | POA: Diagnosis not present

## 2018-07-05 DIAGNOSIS — E1140 Type 2 diabetes mellitus with diabetic neuropathy, unspecified: Secondary | ICD-10-CM | POA: Diagnosis not present

## 2018-07-06 ENCOUNTER — Telehealth (INDEPENDENT_AMBULATORY_CARE_PROVIDER_SITE_OTHER): Payer: Self-pay | Admitting: Orthopaedic Surgery

## 2018-07-06 DIAGNOSIS — E1140 Type 2 diabetes mellitus with diabetic neuropathy, unspecified: Secondary | ICD-10-CM | POA: Diagnosis not present

## 2018-07-06 DIAGNOSIS — I10 Essential (primary) hypertension: Secondary | ICD-10-CM | POA: Diagnosis not present

## 2018-07-06 DIAGNOSIS — S72002D Fracture of unspecified part of neck of left femur, subsequent encounter for closed fracture with routine healing: Secondary | ICD-10-CM | POA: Diagnosis not present

## 2018-07-06 DIAGNOSIS — Z7982 Long term (current) use of aspirin: Secondary | ICD-10-CM | POA: Diagnosis not present

## 2018-07-06 DIAGNOSIS — W19XXXD Unspecified fall, subsequent encounter: Secondary | ICD-10-CM | POA: Diagnosis not present

## 2018-07-06 DIAGNOSIS — Z794 Long term (current) use of insulin: Secondary | ICD-10-CM | POA: Diagnosis not present

## 2018-07-06 NOTE — Telephone Encounter (Signed)
Home physical therapy  Twice a week for 2 weeks  Once a week for 2 weeks

## 2018-07-06 NOTE — Telephone Encounter (Signed)
Verbal order left on VM  

## 2018-07-07 DIAGNOSIS — W19XXXD Unspecified fall, subsequent encounter: Secondary | ICD-10-CM | POA: Diagnosis not present

## 2018-07-07 DIAGNOSIS — S72002D Fracture of unspecified part of neck of left femur, subsequent encounter for closed fracture with routine healing: Secondary | ICD-10-CM | POA: Diagnosis not present

## 2018-07-07 DIAGNOSIS — E1140 Type 2 diabetes mellitus with diabetic neuropathy, unspecified: Secondary | ICD-10-CM | POA: Diagnosis not present

## 2018-07-07 DIAGNOSIS — Z794 Long term (current) use of insulin: Secondary | ICD-10-CM | POA: Diagnosis not present

## 2018-07-07 DIAGNOSIS — I10 Essential (primary) hypertension: Secondary | ICD-10-CM | POA: Diagnosis not present

## 2018-07-07 DIAGNOSIS — Z7982 Long term (current) use of aspirin: Secondary | ICD-10-CM | POA: Diagnosis not present

## 2018-07-09 ENCOUNTER — Telehealth (INDEPENDENT_AMBULATORY_CARE_PROVIDER_SITE_OTHER): Payer: Self-pay | Admitting: Orthopaedic Surgery

## 2018-07-09 DIAGNOSIS — S72002D Fracture of unspecified part of neck of left femur, subsequent encounter for closed fracture with routine healing: Secondary | ICD-10-CM | POA: Diagnosis not present

## 2018-07-09 DIAGNOSIS — W19XXXD Unspecified fall, subsequent encounter: Secondary | ICD-10-CM | POA: Diagnosis not present

## 2018-07-09 DIAGNOSIS — Z794 Long term (current) use of insulin: Secondary | ICD-10-CM | POA: Diagnosis not present

## 2018-07-09 DIAGNOSIS — E1140 Type 2 diabetes mellitus with diabetic neuropathy, unspecified: Secondary | ICD-10-CM | POA: Diagnosis not present

## 2018-07-09 DIAGNOSIS — I10 Essential (primary) hypertension: Secondary | ICD-10-CM | POA: Diagnosis not present

## 2018-07-09 DIAGNOSIS — Z7982 Long term (current) use of aspirin: Secondary | ICD-10-CM | POA: Diagnosis not present

## 2018-07-09 NOTE — Telephone Encounter (Signed)
Patient called stating that she is allergic to Percocet which was given to her over at [facility].  She is wanting to know if Dr. Ninfa Linden would call her in something else for the pain because her pain level is at an 8.  CB#[phone].  Thank you.

## 2018-07-09 NOTE — Telephone Encounter (Signed)
Donita from [facility] called to request VO for Skilled Nursing for the following:  1x a week for 4 weeks  CB#[phone].  Thank you.

## 2018-07-09 NOTE — Telephone Encounter (Signed)
Please advise 

## 2018-07-10 DIAGNOSIS — M5489 Other dorsalgia: Secondary | ICD-10-CM | POA: Diagnosis not present

## 2018-07-10 MED ORDER — HYDROCODONE-ACETAMINOPHEN 5-325 MG PO TABS: 1.0000 | ORAL_TABLET | Freq: Four times a day (QID) | ORAL | 0 refills | Status: DC | PRN

## 2018-07-10 NOTE — Telephone Encounter (Signed)
I sent in some hydrocodone for her.

## 2018-07-10 NOTE — Telephone Encounter (Signed)
Verbal order given  

## 2018-07-14 DIAGNOSIS — I10 Essential (primary) hypertension: Secondary | ICD-10-CM | POA: Diagnosis not present

## 2018-07-14 DIAGNOSIS — Z7982 Long term (current) use of aspirin: Secondary | ICD-10-CM | POA: Diagnosis not present

## 2018-07-14 DIAGNOSIS — S72002D Fracture of unspecified part of neck of left femur, subsequent encounter for closed fracture with routine healing: Secondary | ICD-10-CM | POA: Diagnosis not present

## 2018-07-14 DIAGNOSIS — E1140 Type 2 diabetes mellitus with diabetic neuropathy, unspecified: Secondary | ICD-10-CM | POA: Diagnosis not present

## 2018-07-14 DIAGNOSIS — W19XXXD Unspecified fall, subsequent encounter: Secondary | ICD-10-CM | POA: Diagnosis not present

## 2018-07-14 DIAGNOSIS — Z794 Long term (current) use of insulin: Secondary | ICD-10-CM | POA: Diagnosis not present

## 2018-07-15 ENCOUNTER — Other Ambulatory Visit: Payer: Self-pay

## 2018-07-15 ENCOUNTER — Telehealth: Payer: Self-pay | Admitting: Endocrinology

## 2018-07-15 MED ORDER — GLUCOSE BLOOD VI STRP: ORAL_STRIP | 0 refills | Status: DC

## 2018-07-15 NOTE — Telephone Encounter (Signed)
Rx sent 

## 2018-07-15 NOTE — Telephone Encounter (Signed)
MEDICATION: One Touch Verio strips  PHARMACY:  [facility]  IS THIS A 90 DAY SUPPLY :   IS PATIENT OUT OF MEDICATION: no  IF NOT; HOW MUCH IS LEFT: less than 50 strips  LAST APPOINTMENT DATE: @2 /20/2020  NEXT APPOINTMENT DATE:@4 /14/2020  DO WE HAVE YOUR PERMISSION TO LEAVE A DETAILED MESSAGE:  OTHER COMMENTS: Patient called and states that her pharmacy has sent a form over to be signed so that she can have her test strips refilled as a reduced cost   **Let patient know to contact pharmacy at the end of the day to make sure medication is ready. **  ** Please notify patient to allow 48-72 hours to process**  **Encourage patient to contact the pharmacy for refills or they can request refills through [facility]**

## 2018-07-16 DIAGNOSIS — I10 Essential (primary) hypertension: Secondary | ICD-10-CM | POA: Diagnosis not present

## 2018-07-16 DIAGNOSIS — W19XXXD Unspecified fall, subsequent encounter: Secondary | ICD-10-CM | POA: Diagnosis not present

## 2018-07-16 DIAGNOSIS — Z794 Long term (current) use of insulin: Secondary | ICD-10-CM | POA: Diagnosis not present

## 2018-07-16 DIAGNOSIS — Z7982 Long term (current) use of aspirin: Secondary | ICD-10-CM | POA: Diagnosis not present

## 2018-07-16 DIAGNOSIS — S72002D Fracture of unspecified part of neck of left femur, subsequent encounter for closed fracture with routine healing: Secondary | ICD-10-CM | POA: Diagnosis not present

## 2018-07-16 DIAGNOSIS — E1140 Type 2 diabetes mellitus with diabetic neuropathy, unspecified: Secondary | ICD-10-CM | POA: Diagnosis not present

## 2018-07-21 DIAGNOSIS — W19XXXD Unspecified fall, subsequent encounter: Secondary | ICD-10-CM | POA: Diagnosis not present

## 2018-07-21 DIAGNOSIS — Z794 Long term (current) use of insulin: Secondary | ICD-10-CM | POA: Diagnosis not present

## 2018-07-21 DIAGNOSIS — Z7982 Long term (current) use of aspirin: Secondary | ICD-10-CM | POA: Diagnosis not present

## 2018-07-21 DIAGNOSIS — S72002D Fracture of unspecified part of neck of left femur, subsequent encounter for closed fracture with routine healing: Secondary | ICD-10-CM | POA: Diagnosis not present

## 2018-07-21 DIAGNOSIS — E1140 Type 2 diabetes mellitus with diabetic neuropathy, unspecified: Secondary | ICD-10-CM | POA: Diagnosis not present

## 2018-07-21 DIAGNOSIS — I10 Essential (primary) hypertension: Secondary | ICD-10-CM | POA: Diagnosis not present

## 2018-07-22 ENCOUNTER — Telehealth (INDEPENDENT_AMBULATORY_CARE_PROVIDER_SITE_OTHER): Payer: Self-pay | Admitting: Orthopaedic Surgery

## 2018-07-22 DIAGNOSIS — Z7982 Long term (current) use of aspirin: Secondary | ICD-10-CM | POA: Diagnosis not present

## 2018-07-22 DIAGNOSIS — E1140 Type 2 diabetes mellitus with diabetic neuropathy, unspecified: Secondary | ICD-10-CM | POA: Diagnosis not present

## 2018-07-22 DIAGNOSIS — W19XXXD Unspecified fall, subsequent encounter: Secondary | ICD-10-CM | POA: Diagnosis not present

## 2018-07-22 DIAGNOSIS — S72002D Fracture of unspecified part of neck of left femur, subsequent encounter for closed fracture with routine healing: Secondary | ICD-10-CM | POA: Diagnosis not present

## 2018-07-22 DIAGNOSIS — Z794 Long term (current) use of insulin: Secondary | ICD-10-CM | POA: Diagnosis not present

## 2018-07-22 DIAGNOSIS — I10 Essential (primary) hypertension: Secondary | ICD-10-CM | POA: Diagnosis not present

## 2018-07-22 NOTE — Telephone Encounter (Signed)
Spoke with patient asked the pre-screening questions    Patient said no to all questions

## 2018-07-23 ENCOUNTER — Ambulatory Visit (INDEPENDENT_AMBULATORY_CARE_PROVIDER_SITE_OTHER): Payer: Medicare Other

## 2018-07-23 ENCOUNTER — Encounter (INDEPENDENT_AMBULATORY_CARE_PROVIDER_SITE_OTHER): Payer: Self-pay | Admitting: Orthopaedic Surgery

## 2018-07-23 ENCOUNTER — Other Ambulatory Visit: Payer: Self-pay

## 2018-07-23 ENCOUNTER — Ambulatory Visit (INDEPENDENT_AMBULATORY_CARE_PROVIDER_SITE_OTHER): Payer: Medicare Other | Admitting: Orthopaedic Surgery

## 2018-07-23 DIAGNOSIS — S72002D Fracture of unspecified part of neck of left femur, subsequent encounter for closed fracture with routine healing: Secondary | ICD-10-CM

## 2018-07-23 DIAGNOSIS — M25552 Pain in left hip: Secondary | ICD-10-CM

## 2018-07-23 MED ORDER — HYDROCODONE-ACETAMINOPHEN 5-325 MG PO TABS: 1.0000 | ORAL_TABLET | Freq: Four times a day (QID) | ORAL | 0 refills | Status: DC | PRN

## 2018-07-23 NOTE — Progress Notes (Signed)
The patient is a very pleasant 78 year old female is now 6 weeks status post a mechanical fall in which she sustained a midshaft femur fracture.  We placed antegrade femoral nail down the femoral canal with a proximal interlock and 2 distal interlocking screws.  She is ambulating with weightbearing as tolerated.  She does need a refill on her hydrocodone.  She is complaining of some left hip pain but she points the trochanteric area and the area where we inserted the intramedullary nail as a source of her pain.  It can be sharp at times.  She does report some mild thigh pain.  She is using a walker when she ambulates.  On exam I can put her hip and knee full range of motion of the left side with some moderate discomfort.  2 views of the hip and femur seen on the left side showed no acute findings the left hip.  There is actually significant callus formation that is seen on today's x-rays were compared to x-rays from just 3 and half weeks ago showing significant healing of the fracture.  I gave her reassurance that this is heading in the right direction.  I will send in some hydrocodone for her.  I have encouraged her to try to transition to a cane in her opposite hand when she is comfortable.  I would like to see her back in 4 weeks with a repeat 2 views of her left femur.  All question concerns were answered and addressed.

## 2018-07-28 ENCOUNTER — Telehealth (INDEPENDENT_AMBULATORY_CARE_PROVIDER_SITE_OTHER): Payer: Self-pay | Admitting: Orthopaedic Surgery

## 2018-07-28 DIAGNOSIS — Z794 Long term (current) use of insulin: Secondary | ICD-10-CM | POA: Diagnosis not present

## 2018-07-28 DIAGNOSIS — Z7982 Long term (current) use of aspirin: Secondary | ICD-10-CM | POA: Diagnosis not present

## 2018-07-28 DIAGNOSIS — W19XXXD Unspecified fall, subsequent encounter: Secondary | ICD-10-CM | POA: Diagnosis not present

## 2018-07-28 DIAGNOSIS — E1140 Type 2 diabetes mellitus with diabetic neuropathy, unspecified: Secondary | ICD-10-CM | POA: Diagnosis not present

## 2018-07-28 DIAGNOSIS — S72002D Fracture of unspecified part of neck of left femur, subsequent encounter for closed fracture with routine healing: Secondary | ICD-10-CM | POA: Diagnosis not present

## 2018-07-28 DIAGNOSIS — I10 Essential (primary) hypertension: Secondary | ICD-10-CM | POA: Diagnosis not present

## 2018-07-28 NOTE — Telephone Encounter (Signed)
Claiborne Billings, PT, with [facility] called to request VO for continued [facility] PT for 2x a week for 3 weeks and 1x a week for 1 week.  CB#[phone].  Thank you.

## 2018-07-28 NOTE — Telephone Encounter (Signed)
Called to give verbal orders to [place]. No answer. LMOM with approval on orders.

## 2018-07-30 ENCOUNTER — Other Ambulatory Visit: Payer: Self-pay

## 2018-07-30 MED ORDER — GLUCOSE BLOOD VI STRP: ORAL_STRIP | 3 refills | Status: DC

## 2018-08-03 DIAGNOSIS — J309 Allergic rhinitis, unspecified: Secondary | ICD-10-CM | POA: Diagnosis not present

## 2018-08-04 ENCOUNTER — Other Ambulatory Visit: Payer: Medicare Other

## 2018-08-04 DIAGNOSIS — I10 Essential (primary) hypertension: Secondary | ICD-10-CM | POA: Diagnosis not present

## 2018-08-04 DIAGNOSIS — Z7982 Long term (current) use of aspirin: Secondary | ICD-10-CM | POA: Diagnosis not present

## 2018-08-04 DIAGNOSIS — W19XXXD Unspecified fall, subsequent encounter: Secondary | ICD-10-CM | POA: Diagnosis not present

## 2018-08-04 DIAGNOSIS — Z794 Long term (current) use of insulin: Secondary | ICD-10-CM | POA: Diagnosis not present

## 2018-08-04 DIAGNOSIS — S72002D Fracture of unspecified part of neck of left femur, subsequent encounter for closed fracture with routine healing: Secondary | ICD-10-CM | POA: Diagnosis not present

## 2018-08-04 DIAGNOSIS — E1140 Type 2 diabetes mellitus with diabetic neuropathy, unspecified: Secondary | ICD-10-CM | POA: Diagnosis not present

## 2018-08-05 ENCOUNTER — Other Ambulatory Visit: Payer: Self-pay

## 2018-08-05 ENCOUNTER — Other Ambulatory Visit: Payer: Self-pay | Admitting: Internal Medicine

## 2018-08-05 DIAGNOSIS — S72002D Fracture of unspecified part of neck of left femur, subsequent encounter for closed fracture with routine healing: Secondary | ICD-10-CM | POA: Diagnosis not present

## 2018-08-05 DIAGNOSIS — Z794 Long term (current) use of insulin: Secondary | ICD-10-CM | POA: Diagnosis not present

## 2018-08-05 DIAGNOSIS — I10 Essential (primary) hypertension: Secondary | ICD-10-CM | POA: Diagnosis not present

## 2018-08-05 DIAGNOSIS — Z7982 Long term (current) use of aspirin: Secondary | ICD-10-CM | POA: Diagnosis not present

## 2018-08-05 DIAGNOSIS — W19XXXD Unspecified fall, subsequent encounter: Secondary | ICD-10-CM | POA: Diagnosis not present

## 2018-08-05 DIAGNOSIS — E1140 Type 2 diabetes mellitus with diabetic neuropathy, unspecified: Secondary | ICD-10-CM | POA: Diagnosis not present

## 2018-08-06 ENCOUNTER — Other Ambulatory Visit: Payer: Self-pay

## 2018-08-06 ENCOUNTER — Telehealth (INDEPENDENT_AMBULATORY_CARE_PROVIDER_SITE_OTHER): Payer: Self-pay | Admitting: Orthopaedic Surgery

## 2018-08-06 ENCOUNTER — Ambulatory Visit: Payer: Medicare Other | Admitting: Endocrinology

## 2018-08-06 ENCOUNTER — Other Ambulatory Visit (INDEPENDENT_AMBULATORY_CARE_PROVIDER_SITE_OTHER): Payer: Self-pay | Admitting: Orthopaedic Surgery

## 2018-08-06 ENCOUNTER — Ambulatory Visit (INDEPENDENT_AMBULATORY_CARE_PROVIDER_SITE_OTHER): Payer: Medicare Other | Admitting: Endocrinology

## 2018-08-06 DIAGNOSIS — E782 Mixed hyperlipidemia: Secondary | ICD-10-CM | POA: Diagnosis not present

## 2018-08-06 DIAGNOSIS — E063 Autoimmune thyroiditis: Secondary | ICD-10-CM

## 2018-08-06 DIAGNOSIS — Z794 Long term (current) use of insulin: Secondary | ICD-10-CM | POA: Diagnosis not present

## 2018-08-06 DIAGNOSIS — E1165 Type 2 diabetes mellitus with hyperglycemia: Secondary | ICD-10-CM

## 2018-08-06 MED ORDER — HYDROCODONE-ACETAMINOPHEN 5-325 MG PO TABS: 1.0000 | ORAL_TABLET | Freq: Four times a day (QID) | ORAL | 0 refills | Status: DC | PRN

## 2018-08-06 NOTE — Telephone Encounter (Signed)
Please advise 

## 2018-08-06 NOTE — Progress Notes (Signed)
Patient ID: [name], female   DOB: 17-Oct-1940, 78 y.o.   MRN: [id number]   Today's office visit was provided via telemedicine using video technique Explained to the patient and the the limitations of evaluation and management by telemedicine and the availability of in person appointments.  The patient understood the limitations and agreed to proceed. Patient also understood that the telehealth visit is billable. . Location of the patient: Home . Location of the provider: Office Only the patient and myself were participating in the encounter     Reason for Appointment: Endocrinology follow-up  History of Present Illness    PROBLEM 1: Type 2 diabetes mellitus, date of diagnosis: 1998.   Prior history: She had previously been treated with Byetta, Glumetza, Amaryl, Victoza and  Onglyza However because of inadequate control and intolerance to drugs she was finally given pre-meal insulin along with Amaryl, Glumetza eventually stopped because of side effects and also Lantus added  Recent history:  The insulin regimen is: Novolog 6-8 at breakfast and 10/12 lunch,  acs Tresiba 22 units at lunch  Oral agents: Farxiga 5 mg daily  Her A1c has been mostly between about 6.2 -7.4, in 1/20 was higher than before at 7.3  Current blood sugar patterns and problems with management:  She has started [place] as prescribed in 1/20 because of higher readings, increasing insulin doses and concern about weight gain  Although she has been told to reduce her insulin on her last visit she is having sporadically higher blood sugar readings now  This is most likely when she is eating unbalanced meals and more carbohydrate because of difficulty with preparing her food after hip fracture in February  However she appears to have lost 6 pounds because of her inconsistent appetite and needing more insulin  Only has a few blood sugars to review today and unable to a certain her average blood  sugar at home also  However her fasting readings are slightly higher than before  Postprandial readings are not checked and she has sporadic readings at lunch and dinnertime without any consistent pattern  Today she had a small breakfast with some meat for protein and limited carbohydrate, with 4 units of NovoLog her blood sugar was 109 at lunch  However on Monday she had ice cream and also bread and her blood sugar went up to 344  Currently not adjusting her insulin proactively based on what she is planning to eat  Not taking any steroids recently No hypoglycemia also with lowest blood sugar 104 fasting  Side effects from diabetes medications: Victoza caused nausea.  Metformin  Monitors blood glucose: 1-2 times a day, readings and MEDIAN numbers as below Glucometer: One Touch ultra 2.    FASTING range 104-147 with only 3 readings Nonfasting 109-344 with only 6 readings recently  Previous readings:  PRE-MEAL Fasting Lunch Dinner Bedtime Overall  Glucose range:  92-  72-137  73-163  180   Mean/median:  103  105    105   POST-MEAL PC Breakfast PC Lunch PC Dinner  Glucose range:     Mean/median:  126      Meals: 2-3 meals per day at 10 am. 2 pm and 6-7 pm but inconsistent schedule.  Breakfast may be [place] sausage and egg with toast, occasionally oatmeal or cereal   Physical activity: exercise: None, previously doing water aerobics 3/7 days a week  Certified Diabetes Educator visit: Most recent: 7/13.  Dietician visit: Most recent: 3/13.  Wt Readings from Last 3 Encounters:  07/02/18 183 lb (83 kg)  06/16/18 183 lb 6.4 oz (83.2 kg)  06/12/18 183 lb 6.4 oz (03.4 kg)   Complications: Neuropathy  LABS:  Lab Results  Component Value Date   HGBA1C 7.3 (H) 04/23/2018   HGBA1C 6.4 01/22/2018   HGBA1C 6.3 08/14/2017   Lab Results  Component Value Date   MICROALBUR <0.7 01/13/2017   LDLCALC 190 (H) 04/23/2018   CREATININE 0.7 06/22/2018   Lab Results  Component  Value Date   FRUCTOSAMINE 283 06/09/2018   FRUCTOSAMINE 296 (H) 05/16/2017   FRUCTOSAMINE 322 (H) 10/10/2016    Multiple other issues are addressed in review of systems  No visits with results within 1 Week(s) from this visit.  Latest known visit with results is:  Abstract on 06/23/2018  Component Date Value Ref Range Status  . Hemoglobin 06/22/2018 8.9* 12.0 - 16.0 Final  . HCT 06/22/2018 27* 36 - 46 Final  . Platelets 06/22/2018 320  150 - 399 Final  . WBC 06/22/2018 10.0   Final  . Glucose 06/22/2018 121   Final  . BUN 06/22/2018 14  4 - 21 Final  . Creatinine 06/22/2018 0.7  0.5 - 1.1 Final  . Potassium 06/22/2018 4.3  3.4 - 5.3 Final  . Sodium 06/22/2018 140  137 - 147 Final      Allergies as of 08/06/2018      Reactions   Cymbalta [duloxetine Hcl] Diarrhea, Other (See Comments)   Dizziness, headache, irritability   Baclofen    jerks    Betadine [povidone Iodine] Swelling, Other (See Comments)   SWELLING REACTION DESCRIPTION/SEVERITY UNSPECIFIED  Reaction to betadine eye drops   Adhesive [tape] Rash   Lipitor [atorvastatin] Rash      Medication List       Accurate as of August 06, 2018  1:49 PM. Always use your most recent med list.        acetaminophen 500 MG tablet Commonly known as:  TYLENOL Take 1,000 mg by mouth every 8 (eight) hours as needed for mild pain or moderate pain.   amLODipine 10 MG tablet Commonly known as:  NORVASC Take 1 tablet (10 mg total) by mouth daily.   aspirin 325 MG EC tablet Take 1 tablet (325 mg total) by mouth daily with breakfast.   cetirizine 10 MG tablet Commonly known as:  ZYRTEC Take 1 tablet (10 mg total) by mouth daily as needed for allergies.   Colesevelam HCl 3.75 g Pack Dissolve 1 packet in unsweetened beverage and take daily with lunch   dapagliflozin propanediol 5 MG Tabs tablet Commonly known as:  Farxiga Take 5 mg by mouth daily.   fluticasone 50 MCG/ACT nasal spray Commonly known as:  FLONASE Place 1  spray into both nostrils daily as needed for allergies.   gabapentin 600 MG tablet Commonly known as:  Neurontin Take 1 tablet (600 mg total) by mouth 3 (three) times daily.   glucose blood test strip Commonly known as:  ONE TOUCH ULTRA TEST Use as instructed to check blood sugar 4 times daily. DX:E11.65   HYDROcodone-acetaminophen 5-325 MG tablet Commonly known as:  NORCO/VICODIN Take 1-2 tablets by mouth every 6 (six) hours as needed for moderate pain.   insulin aspart 100 UNIT/ML FlexPen Commonly known as:  NovoLOG FlexPen INJECT 6 UNITS UNDER THE SKIN BEFORE BREAKFAST, 10 UNITS BEFORE LUNCH, AND 9 UNITS BEFORE SUPPER   insulin degludec 100 UNIT/ML Sopn FlexTouch Pen Commonly known as:  Tyler Aas FlexTouch INJECT 24 UNITS UNDER THE SKIN ONCE DAILY   Insulin Pen Needle 32G X 4 MM Misc Commonly known as:  BD Pen Needle Nano U/F USE AS DIRECTED   levothyroxine 50 MCG tablet Commonly known as:  SYNTHROID, LEVOTHROID Take 1 tablet (50 mcg total) by mouth daily.   MAGNESIUM PO Take 250 mg by mouth 2 (two) times daily.   methocarbamol 500 MG tablet Commonly known as:  ROBAXIN Take 1 tablet (500 mg total) by mouth every 6 (six) hours as needed for muscle spasms.   oxyCODONE 5 MG immediate release tablet Commonly known as:  Oxy IR/ROXICODONE Take 1 tablet (5 mg total) by mouth every 6 (six) hours as needed for severe pain.   potassium chloride 10 MEQ tablet Commonly known as:  K-DUR,KLOR-CON Take 1 tablet (10 mEq total) by mouth 2 (two) times daily.   ProAir RespiClick 034 (90 Base) MCG/ACT Aepb Generic drug:  Albuterol Sulfate Inhale 2 puffs into the lungs 3 (three) times daily as needed (shortness of breath).   Symbicort 80-4.5 MCG/ACT inhaler Generic drug:  budesonide-formoterol Inhale 2 puffs into the lungs 2 (two) times daily as needed (shortness of breath).       Allergies:  Allergies  Allergen Reactions  . Cymbalta [Duloxetine Hcl] Diarrhea and Other (See  Comments)    Dizziness, headache, irritability  . Baclofen     jerks   . Betadine [Povidone Iodine] Swelling and Other (See Comments)    SWELLING REACTION DESCRIPTION/SEVERITY UNSPECIFIED  Reaction to betadine eye drops  . Adhesive [Tape] Rash  . Lipitor [Atorvastatin] Rash    Past Medical History:  Diagnosis Date  . Acute blood loss anemia   . Acute encephalopathy 07/15/2016  . AKI (acute kidney injury) ([place])   . Anemia    has sickle cell trait  . Anxiety   . Arthritis   . Asthma    has used inhaler in past for asthmatic bronchitis, last time- early 2012  . Bilateral primary osteoarthritis of knee 03/26/2016  . Complication of anesthesia    wakes up shaking  . Diabetes mellitus   . Diabetes mellitus with neuropathy ([place]) 10/29/2012  . Diverticulitis   . Dyslipidemia   . Encephalopathy 06/2016   due to medications after surgery  . Fibromyalgia   . GERD (gastroesophageal reflux disease)    occas. use of  Prilosec  . Heart murmur    sees Dr. Montez Morita, last seen- early 2012  . Herniated nucleus pulposus, L2-3 06/25/2016  . History of back surgery   . Hypertension    02/2010- stress test /w PCP  . Hypothyroidism   . Loose bowel movements 12/2016  . Lumbar radiculopathy 06/27/2016  . Lumbar stenosis with neurogenic claudication 03/17/2017  . Memory disorder 02/16/2016  . Myoclonic jerking   . Neuromuscular disorder (HCC)    lumbar radiculopathy, lumbago  . Nocturnal leg cramps 09/27/2014  . Osteoporosis 03/10/2014  . Pneumonia   . Sickle cell trait ([place])   . Sleep apnea    borderline sleep apnea, states she no longer uses, early 2012- stopped using   . Spinal stenosis of lumbar region with radiculopathy 02/28/2014  . Spondylolisthesis of lumbar region 03/19/2011  . Type II or unspecified type diabetes mellitus without mention of complication, uncontrolled 07/08/2013    Past Surgical History:  Procedure Laterality Date  . ABDOMINAL HYSTERECTOMY    . adb.cyst     ovarian  cyst  . BACK SURGERY     2012, 2015 (  3 total)  . BACK SURGERY  2018   02/24/2017  . COLONOSCOPY    . EYE SURGERY     macular degeneration treatment - injections  . FEMUR IM NAIL Left 06/13/2018   Procedure: INTRAMEDULLARY (IM) NAIL FEMORAL;  Surgeon: [name], MD;  Location: [facility];  Service: Orthopedics;  Laterality: Left;  . OVARIAN CYST SURGERY    . POSTERIOR LUMBAR FUSION 4 LEVEL N/A 03/17/2017   Procedure: Decompression of Lumbar One-Two with Thoracic Ten to Lumbar Two Fusion;  Surgeon: Kristeen Miss, MD;  Location: [facility];  Service: Neurosurgery;  Laterality: N/A;  Decompression of L1-2 with T10 to L2 Fusion    Family History  Problem Relation Age of Onset  . Ovarian cancer Mother   . Cancer - Prostate Father   . Breast cancer Paternal Aunt   . Multiple myeloma Paternal Aunt   . Anesthesia problems Neg Hx   . Hypotension Neg Hx   . Malignant hyperthermia Neg Hx   . Pseudochol deficiency Neg Hx     Social History:  reports that she quit smoking about 15 years ago. Her smoking use included cigarettes. She has a 3.00 pack-year smoking history. She has never used smokeless tobacco. She reports current alcohol use. She reports that she does not use drugs.  ROS    NEUROPATHY: She has had tingling in feet and legs especially at night. She is taking gabapentin without consistent relief at night At times she will take Lyrica with somewhat better relief at night This was also prescribed as needed for fibromyalgia by her rheumatologist  MUSCLE cramps: She is having less problems now with getting Gatorade and electrolyte solutions Her PCP told her to stop Zetia On Mg   Hyperlipidemia:   The lipid abnormality consists of elevated LDL , high triglyceride and did not tolerate Crestor or lovastatin She thinks they make her cramps worse  She is off Zetia, previously did think she was having muscle cramps  Her lipids are poorly controlled on the last assessment    Lab Results  Component Value Date   CHOL 281 (H) 04/23/2018   HDL 56.90 04/23/2018   LDLCALC 190 (H) 04/23/2018   LDLDIRECT 115.0 12/18/2016   TRIG 168.0 (H) 04/23/2018   CHOLHDL 5 04/23/2018      HYPERTENSION:  Has been present for several years.  This is  controlled with taking Avapro 150 from her PCP  Blood pressure checked recently by physical therapist 130/60  POTASSIUM levels: Usually low normal but now improved with potassium supplements . Has a history of leg and muscle cramps which are better with magnesium supplements, recently having more cramps   Lab Results  Component Value Date   CREATININE 0.7 06/22/2018   BUN 14 06/22/2018   NA 140 06/22/2018   K 4.3 06/22/2018   CL 106 06/15/2018   CO2 25 06/15/2018       Hypothyroidism  She had a relatively high TSH as of 4/17 and was empirically given Synthroid 25 g  On her last visit she had been taking 50 mcg which was not recommended by myself but she felt a little better and since TSH was 3.5 she was told to continue the same dose No recent labs available  TSH history as follows   Lab Results  Component Value Date   TSH 3.54 06/09/2018   TSH 5.38 (H) 04/23/2018   TSH 2.21 08/14/2017   FREET4 0.78 02/22/2016  Examination:   There were no vitals taken for this visit.  There is no height or weight on file to calculate BMI.     Assesment/Plan:   1. DIABETES type 2   See history of present illness for detailed discussion of management, blood sugar patterns and problems identified  Blood sugars are difficult to assess since he has monitored infrequently  Last A1c was 7.3  Blood sugars are are variable based on her diet and sporadically will have readings over 200 or 300 if she is eating more carbohydrate She is not adjusting her insulin as directed for various types of meals However fasting readings are only mildly increased at times with 22 units of Tresiba  She overall has benefited  from [place] however with generally better control Discussed she can take up to 14 units of mealtime insulin for high carbohydrate meals but may reduce the dose to 4 units for very small meals like today She does need to monitor more often  3. Hypertension: Blood pressure is controlled with taking Avapro  HYPOKALEMIA: Her potassium is over 4.0 with 10 mEq prescription supplement daily and she can stop this now   4.   Hypothyroidism: She is taking 50 mcg levothyroxine and will reassess her labs on the next visit      There are no Patient Instructions on file for this visit.       [name] 08/06/2018, 1:49 PM

## 2018-08-06 NOTE — Telephone Encounter (Signed)
Patient called requesting an RX refill on his Hydrocodone.  CB#[phone].  Thank you.

## 2018-08-07 DIAGNOSIS — Z794 Long term (current) use of insulin: Secondary | ICD-10-CM | POA: Diagnosis not present

## 2018-08-07 DIAGNOSIS — S72002D Fracture of unspecified part of neck of left femur, subsequent encounter for closed fracture with routine healing: Secondary | ICD-10-CM | POA: Diagnosis not present

## 2018-08-07 DIAGNOSIS — E1140 Type 2 diabetes mellitus with diabetic neuropathy, unspecified: Secondary | ICD-10-CM | POA: Diagnosis not present

## 2018-08-07 DIAGNOSIS — W19XXXD Unspecified fall, subsequent encounter: Secondary | ICD-10-CM | POA: Diagnosis not present

## 2018-08-07 DIAGNOSIS — I10 Essential (primary) hypertension: Secondary | ICD-10-CM | POA: Diagnosis not present

## 2018-08-07 DIAGNOSIS — Z7982 Long term (current) use of aspirin: Secondary | ICD-10-CM | POA: Diagnosis not present

## 2018-08-11 DIAGNOSIS — W19XXXD Unspecified fall, subsequent encounter: Secondary | ICD-10-CM | POA: Diagnosis not present

## 2018-08-11 DIAGNOSIS — Z794 Long term (current) use of insulin: Secondary | ICD-10-CM | POA: Diagnosis not present

## 2018-08-11 DIAGNOSIS — S72002D Fracture of unspecified part of neck of left femur, subsequent encounter for closed fracture with routine healing: Secondary | ICD-10-CM | POA: Diagnosis not present

## 2018-08-11 DIAGNOSIS — I10 Essential (primary) hypertension: Secondary | ICD-10-CM | POA: Diagnosis not present

## 2018-08-11 DIAGNOSIS — E1140 Type 2 diabetes mellitus with diabetic neuropathy, unspecified: Secondary | ICD-10-CM | POA: Diagnosis not present

## 2018-08-11 DIAGNOSIS — Z7982 Long term (current) use of aspirin: Secondary | ICD-10-CM | POA: Diagnosis not present

## 2018-08-13 DIAGNOSIS — Z794 Long term (current) use of insulin: Secondary | ICD-10-CM | POA: Diagnosis not present

## 2018-08-13 DIAGNOSIS — E1140 Type 2 diabetes mellitus with diabetic neuropathy, unspecified: Secondary | ICD-10-CM | POA: Diagnosis not present

## 2018-08-13 DIAGNOSIS — Z7982 Long term (current) use of aspirin: Secondary | ICD-10-CM | POA: Diagnosis not present

## 2018-08-13 DIAGNOSIS — S72002D Fracture of unspecified part of neck of left femur, subsequent encounter for closed fracture with routine healing: Secondary | ICD-10-CM | POA: Diagnosis not present

## 2018-08-13 DIAGNOSIS — W19XXXD Unspecified fall, subsequent encounter: Secondary | ICD-10-CM | POA: Diagnosis not present

## 2018-08-13 DIAGNOSIS — I10 Essential (primary) hypertension: Secondary | ICD-10-CM | POA: Diagnosis not present

## 2018-08-18 DIAGNOSIS — I10 Essential (primary) hypertension: Secondary | ICD-10-CM | POA: Diagnosis not present

## 2018-08-18 DIAGNOSIS — S72002D Fracture of unspecified part of neck of left femur, subsequent encounter for closed fracture with routine healing: Secondary | ICD-10-CM | POA: Diagnosis not present

## 2018-08-18 DIAGNOSIS — W19XXXD Unspecified fall, subsequent encounter: Secondary | ICD-10-CM | POA: Diagnosis not present

## 2018-08-18 DIAGNOSIS — Z7982 Long term (current) use of aspirin: Secondary | ICD-10-CM | POA: Diagnosis not present

## 2018-08-18 DIAGNOSIS — E1140 Type 2 diabetes mellitus with diabetic neuropathy, unspecified: Secondary | ICD-10-CM | POA: Diagnosis not present

## 2018-08-18 DIAGNOSIS — Z794 Long term (current) use of insulin: Secondary | ICD-10-CM | POA: Diagnosis not present

## 2018-08-19 DIAGNOSIS — E7800 Pure hypercholesterolemia, unspecified: Secondary | ICD-10-CM | POA: Diagnosis not present

## 2018-08-19 DIAGNOSIS — M1990 Unspecified osteoarthritis, unspecified site: Secondary | ICD-10-CM | POA: Diagnosis not present

## 2018-08-19 DIAGNOSIS — E039 Hypothyroidism, unspecified: Secondary | ICD-10-CM | POA: Diagnosis not present

## 2018-08-19 DIAGNOSIS — J45909 Unspecified asthma, uncomplicated: Secondary | ICD-10-CM | POA: Diagnosis not present

## 2018-08-19 DIAGNOSIS — I10 Essential (primary) hypertension: Secondary | ICD-10-CM | POA: Diagnosis not present

## 2018-08-19 DIAGNOSIS — Z794 Long term (current) use of insulin: Secondary | ICD-10-CM | POA: Diagnosis not present

## 2018-08-19 DIAGNOSIS — R21 Rash and other nonspecific skin eruption: Secondary | ICD-10-CM | POA: Diagnosis not present

## 2018-08-19 DIAGNOSIS — J309 Allergic rhinitis, unspecified: Secondary | ICD-10-CM | POA: Diagnosis not present

## 2018-08-19 DIAGNOSIS — E1142 Type 2 diabetes mellitus with diabetic polyneuropathy: Secondary | ICD-10-CM | POA: Diagnosis not present

## 2018-08-19 DIAGNOSIS — M519 Unspecified thoracic, thoracolumbar and lumbosacral intervertebral disc disorder: Secondary | ICD-10-CM | POA: Diagnosis not present

## 2018-08-20 ENCOUNTER — Ambulatory Visit (INDEPENDENT_AMBULATORY_CARE_PROVIDER_SITE_OTHER): Payer: Medicare Other | Admitting: Orthopaedic Surgery

## 2018-08-20 ENCOUNTER — Other Ambulatory Visit: Payer: Self-pay

## 2018-08-20 ENCOUNTER — Encounter (INDEPENDENT_AMBULATORY_CARE_PROVIDER_SITE_OTHER): Payer: Self-pay | Admitting: Orthopaedic Surgery

## 2018-08-20 ENCOUNTER — Ambulatory Visit (INDEPENDENT_AMBULATORY_CARE_PROVIDER_SITE_OTHER): Payer: Medicare Other

## 2018-08-20 DIAGNOSIS — I10 Essential (primary) hypertension: Secondary | ICD-10-CM | POA: Diagnosis not present

## 2018-08-20 DIAGNOSIS — Z794 Long term (current) use of insulin: Secondary | ICD-10-CM | POA: Diagnosis not present

## 2018-08-20 DIAGNOSIS — S72002D Fracture of unspecified part of neck of left femur, subsequent encounter for closed fracture with routine healing: Secondary | ICD-10-CM | POA: Diagnosis not present

## 2018-08-20 DIAGNOSIS — Z7982 Long term (current) use of aspirin: Secondary | ICD-10-CM | POA: Diagnosis not present

## 2018-08-20 DIAGNOSIS — E1140 Type 2 diabetes mellitus with diabetic neuropathy, unspecified: Secondary | ICD-10-CM | POA: Diagnosis not present

## 2018-08-20 DIAGNOSIS — W19XXXD Unspecified fall, subsequent encounter: Secondary | ICD-10-CM | POA: Diagnosis not present

## 2018-08-20 NOTE — Progress Notes (Signed)
Office Visit Note   Patient: [name]           Date of Birth: May 03, 1940           MRN: [id number] Visit Date: 08/20/2018              Requested by: [name] [address] [place], [place] [address] PCP: [name]   Assessment & Plan: Visit Diagnoses:  1. Closed fracture of proximal end of left femur with routine healing, subsequent encounter     Plan: She will continue to work with physical therapy on range of motion strengthening of the left hip and left lower extremity.  Scar tissue mobilization encouraged.  Weightbearing as tolerated left lower extremity.  Follow-up in 1 month at that time we will obtain AP and lateral views of the left femur.  Questions were encouraged and answered at length by [name] myself.  Follow-Up Instructions: Return in about 4 weeks (around 09/17/2018) for Radiographs.   Orders:  Orders Placed This Encounter  Procedures  . XR FEMUR MIN 2 VIEWS LEFT   No orders of the defined types were placed in this encounter.     Procedures: No procedures performed   Clinical Data: No additional findings.   Subjective: Chief Complaint  Patient presents with  . Left Leg - Follow-up    HPI [name] returns today follow-up of her left proximal femur fracture.  She underwent an IM nailing of the femur fracture on 06/13/2018 and states that she is overall trending towards improvement.  She states, aches all over due to the rainy weather at this point.  Ambulating with walker.  She continues to work with physical therapy for gait balance and range of motion.  Review of Systems Denies any fevers chills shortness of breath chest pain calf pain  Objective: Vital Signs: There were no vitals taken for this visit.  Physical Exam General: Well-developed well-nourished female no acute distress mood affect appropriate Ortho Exam Left lower extremity: Good range of motion of the left hip without pain.  Able do  straight leg raise.  Has full range of motion of the left knee.  Left calf supple nontender.  Dorsiflexion plantarflexion ankle intact. Specialty Comments:  No specialty comments available.  Imaging: Xr Femur Min 2 Views Left  Result Date: 08/20/2018 Left femur: 2 views show further consolidation.  There is no hardware failure.  Fracture slightly visible at this point time.  Left hip is well located.    PMFS History: Patient Active Problem List   Diagnosis Date Noted  . Fracture, proximal femur ([place]) 06/12/2018  . Femur fracture, left ([place]) 06/12/2018  . Acute blood loss as cause of postoperative anemia 03/30/2017  . Diabetic polyneuropathy associated with secondary diabetes mellitus ([place]) 03/30/2017  . Asthma 03/30/2017  . Dyslipidemia 03/26/2017  . Lumbar stenosis with neurogenic claudication 03/17/2017  . Spinal stenosis of lumbar region 03/03/2017  . Myoclonic jerking   . Nausea   . Acute encephalopathy 07/15/2016  . Lumbar radiculopathy 06/27/2016  . Hypothyroidism 06/26/2016  . Labile blood glucose   . Surgery, elective   . Post-operative pain   . Sickle cell trait ([place])   . Acute blood loss anemia   . History of back surgery   . AKI (acute kidney injury) ([place])   . Herniated nucleus pulposus, L2-3 06/25/2016  . Bilateral primary osteoarthritis of knee 03/26/2016  . Osteoarthritis, hand 03/26/2016  . Other insomnia 03/26/2016  .  Memory disorder 02/16/2016  . Fibromyalgia 09/27/2014  . Nocturnal leg cramps 09/27/2014  . Body mass index (BMI) of 30.0-30.9 in adult 08/04/2014  . Neuropathy 03/10/2014  . Allergic rhinitis 03/10/2014  . Osteoporosis 03/10/2014  . Spinal stenosis of lumbar region with radiculopathy 02/28/2014  . Type 2 diabetes mellitus with complication, with long-term current use of insulin ([place]) 07/08/2013  . Hypokalemia 11/02/2012  . Essential hypertension, benign 11/02/2012  . Diabetes mellitus with neuropathy ([place]) 10/29/2012  . Hyperlipidemia  10/29/2012  . Spondylolisthesis of lumbar region 03/19/2011  . Lumbar radicular pain 03/19/2011   Past Medical History:  Diagnosis Date  . Acute blood loss anemia   . Acute encephalopathy 07/15/2016  . AKI (acute kidney injury) ([place])   . Anemia    has sickle cell trait  . Anxiety   . Arthritis   . Asthma    has used inhaler in past for asthmatic bronchitis, last time- early 2012  . Bilateral primary osteoarthritis of knee 03/26/2016  . Complication of anesthesia    wakes up shaking  . Diabetes mellitus   . Diabetes mellitus with neuropathy ([place]) 10/29/2012  . Diverticulitis   . Dyslipidemia   . Encephalopathy 06/2016   due to medications after surgery  . Fibromyalgia   . GERD (gastroesophageal reflux disease)    occas. use of  Prilosec  . Heart murmur    sees Dr. Montez Morita, last seen- early 2012  . Herniated nucleus pulposus, L2-3 06/25/2016  . History of back surgery   . Hypertension    02/2010- stress test /w PCP  . Hypothyroidism   . Loose bowel movements 12/2016  . Lumbar radiculopathy 06/27/2016  . Lumbar stenosis with neurogenic claudication 03/17/2017  . Memory disorder 02/16/2016  . Myoclonic jerking   . Neuromuscular disorder (HCC)    lumbar radiculopathy, lumbago  . Nocturnal leg cramps 09/27/2014  . Osteoporosis 03/10/2014  . Pneumonia   . Sickle cell trait ([place])   . Sleep apnea    borderline sleep apnea, states she no longer uses, early 2012- stopped using   . Spinal stenosis of lumbar region with radiculopathy 02/28/2014  . Spondylolisthesis of lumbar region 03/19/2011  . Type II or unspecified type diabetes mellitus without mention of complication, uncontrolled 07/08/2013    Family History  Problem Relation Age of Onset  . Ovarian cancer Mother   . Cancer - Prostate Father   . Breast cancer Paternal Aunt   . Multiple myeloma Paternal Aunt   . Anesthesia problems Neg Hx   . Hypotension Neg Hx   . Malignant hyperthermia Neg Hx   . Pseudochol deficiency Neg  Hx     Past Surgical History:  Procedure Laterality Date  . ABDOMINAL HYSTERECTOMY    . adb.cyst     ovarian cyst  . BACK SURGERY     2012, 2015 (3 total)  . BACK SURGERY  2018   02/24/2017  . COLONOSCOPY    . EYE SURGERY     macular degeneration treatment - injections  . FEMUR IM NAIL Left 06/13/2018   Procedure: INTRAMEDULLARY (IM) NAIL FEMORAL;  Surgeon: [name], [name];  Location: [facility];  Service: Orthopedics;  Laterality: Left;  . OVARIAN CYST SURGERY    . POSTERIOR LUMBAR FUSION 4 LEVEL N/A 03/17/2017   Procedure: Decompression of Lumbar One-Two with Thoracic Ten to Lumbar Two Fusion;  Surgeon: Kristeen Miss, [name];  Location: [facility];  Service: Neurosurgery;  Laterality: N/A;  Decompression of L1-2 with T10 to  L2 Fusion   Social History   Occupational History  . Occupation: Retired  Tobacco Use  . Smoking status: Former Smoker    Packs/day: 0.10    Years: 30.00    Pack years: 3.00    Types: Cigarettes    Last attempt to quit: 2005    Years since quitting: 15.3  . Smokeless tobacco: Never Used  Substance and Sexual Activity  . Alcohol use: Yes    Comment: wine /w dinner on occas.  . Drug use: Never  . Sexual activity: Never

## 2018-08-26 ENCOUNTER — Telehealth: Payer: Self-pay | Admitting: Orthopaedic Surgery

## 2018-08-26 DIAGNOSIS — I10 Essential (primary) hypertension: Secondary | ICD-10-CM | POA: Diagnosis not present

## 2018-08-26 DIAGNOSIS — Z7982 Long term (current) use of aspirin: Secondary | ICD-10-CM | POA: Diagnosis not present

## 2018-08-26 DIAGNOSIS — E1140 Type 2 diabetes mellitus with diabetic neuropathy, unspecified: Secondary | ICD-10-CM | POA: Diagnosis not present

## 2018-08-26 DIAGNOSIS — W19XXXD Unspecified fall, subsequent encounter: Secondary | ICD-10-CM | POA: Diagnosis not present

## 2018-08-26 DIAGNOSIS — Z794 Long term (current) use of insulin: Secondary | ICD-10-CM | POA: Diagnosis not present

## 2018-08-26 DIAGNOSIS — S72002D Fracture of unspecified part of neck of left femur, subsequent encounter for closed fracture with routine healing: Secondary | ICD-10-CM | POA: Diagnosis not present

## 2018-08-26 NOTE — Telephone Encounter (Signed)
I left voicemail for Kelly advising.  

## 2018-08-26 NOTE — Telephone Encounter (Signed)
Kelly @ [facility] for verbal order to continue PT 2 twice for 2 weeks & 1 times for 2 weeks.Kelly's callback [phone]

## 2018-08-26 NOTE — Telephone Encounter (Signed)
Sure

## 2018-08-26 NOTE — Telephone Encounter (Signed)
Ok for verbal orders ?

## 2018-08-31 DIAGNOSIS — H31011 Macula scars of posterior pole (postinflammatory) (post-traumatic), right eye: Secondary | ICD-10-CM | POA: Diagnosis not present

## 2018-08-31 DIAGNOSIS — Z794 Long term (current) use of insulin: Secondary | ICD-10-CM | POA: Diagnosis not present

## 2018-08-31 DIAGNOSIS — Z961 Presence of intraocular lens: Secondary | ICD-10-CM | POA: Diagnosis not present

## 2018-08-31 DIAGNOSIS — E119 Type 2 diabetes mellitus without complications: Secondary | ICD-10-CM | POA: Diagnosis not present

## 2018-09-01 DIAGNOSIS — Z7982 Long term (current) use of aspirin: Secondary | ICD-10-CM | POA: Diagnosis not present

## 2018-09-01 DIAGNOSIS — I10 Essential (primary) hypertension: Secondary | ICD-10-CM | POA: Diagnosis not present

## 2018-09-01 DIAGNOSIS — Z794 Long term (current) use of insulin: Secondary | ICD-10-CM | POA: Diagnosis not present

## 2018-09-01 DIAGNOSIS — W19XXXD Unspecified fall, subsequent encounter: Secondary | ICD-10-CM | POA: Diagnosis not present

## 2018-09-01 DIAGNOSIS — E1140 Type 2 diabetes mellitus with diabetic neuropathy, unspecified: Secondary | ICD-10-CM | POA: Diagnosis not present

## 2018-09-01 DIAGNOSIS — S72002D Fracture of unspecified part of neck of left femur, subsequent encounter for closed fracture with routine healing: Secondary | ICD-10-CM | POA: Diagnosis not present

## 2018-09-03 DIAGNOSIS — W19XXXD Unspecified fall, subsequent encounter: Secondary | ICD-10-CM | POA: Diagnosis not present

## 2018-09-03 DIAGNOSIS — E1140 Type 2 diabetes mellitus with diabetic neuropathy, unspecified: Secondary | ICD-10-CM | POA: Diagnosis not present

## 2018-09-03 DIAGNOSIS — I10 Essential (primary) hypertension: Secondary | ICD-10-CM | POA: Diagnosis not present

## 2018-09-03 DIAGNOSIS — S72002D Fracture of unspecified part of neck of left femur, subsequent encounter for closed fracture with routine healing: Secondary | ICD-10-CM | POA: Diagnosis not present

## 2018-09-03 DIAGNOSIS — Z794 Long term (current) use of insulin: Secondary | ICD-10-CM | POA: Diagnosis not present

## 2018-09-03 DIAGNOSIS — Z7982 Long term (current) use of aspirin: Secondary | ICD-10-CM | POA: Diagnosis not present

## 2018-09-08 DIAGNOSIS — Z794 Long term (current) use of insulin: Secondary | ICD-10-CM | POA: Diagnosis not present

## 2018-09-08 DIAGNOSIS — W19XXXD Unspecified fall, subsequent encounter: Secondary | ICD-10-CM | POA: Diagnosis not present

## 2018-09-08 DIAGNOSIS — S72002D Fracture of unspecified part of neck of left femur, subsequent encounter for closed fracture with routine healing: Secondary | ICD-10-CM | POA: Diagnosis not present

## 2018-09-08 DIAGNOSIS — Z7982 Long term (current) use of aspirin: Secondary | ICD-10-CM | POA: Diagnosis not present

## 2018-09-08 DIAGNOSIS — E1140 Type 2 diabetes mellitus with diabetic neuropathy, unspecified: Secondary | ICD-10-CM | POA: Diagnosis not present

## 2018-09-08 DIAGNOSIS — I10 Essential (primary) hypertension: Secondary | ICD-10-CM | POA: Diagnosis not present

## 2018-09-10 DIAGNOSIS — S72002D Fracture of unspecified part of neck of left femur, subsequent encounter for closed fracture with routine healing: Secondary | ICD-10-CM | POA: Diagnosis not present

## 2018-09-10 DIAGNOSIS — Z7982 Long term (current) use of aspirin: Secondary | ICD-10-CM | POA: Diagnosis not present

## 2018-09-10 DIAGNOSIS — W19XXXD Unspecified fall, subsequent encounter: Secondary | ICD-10-CM | POA: Diagnosis not present

## 2018-09-10 DIAGNOSIS — E1140 Type 2 diabetes mellitus with diabetic neuropathy, unspecified: Secondary | ICD-10-CM | POA: Diagnosis not present

## 2018-09-10 DIAGNOSIS — I10 Essential (primary) hypertension: Secondary | ICD-10-CM | POA: Diagnosis not present

## 2018-09-10 DIAGNOSIS — Z794 Long term (current) use of insulin: Secondary | ICD-10-CM | POA: Diagnosis not present

## 2018-09-11 ENCOUNTER — Other Ambulatory Visit: Payer: Self-pay | Admitting: Endocrinology

## 2018-09-15 DIAGNOSIS — I10 Essential (primary) hypertension: Secondary | ICD-10-CM | POA: Diagnosis not present

## 2018-09-15 DIAGNOSIS — E1140 Type 2 diabetes mellitus with diabetic neuropathy, unspecified: Secondary | ICD-10-CM | POA: Diagnosis not present

## 2018-09-15 DIAGNOSIS — W19XXXD Unspecified fall, subsequent encounter: Secondary | ICD-10-CM | POA: Diagnosis not present

## 2018-09-15 DIAGNOSIS — Z7982 Long term (current) use of aspirin: Secondary | ICD-10-CM | POA: Diagnosis not present

## 2018-09-15 DIAGNOSIS — Z794 Long term (current) use of insulin: Secondary | ICD-10-CM | POA: Diagnosis not present

## 2018-09-15 DIAGNOSIS — S72002D Fracture of unspecified part of neck of left femur, subsequent encounter for closed fracture with routine healing: Secondary | ICD-10-CM | POA: Diagnosis not present

## 2018-09-16 DIAGNOSIS — I10 Essential (primary) hypertension: Secondary | ICD-10-CM | POA: Diagnosis not present

## 2018-09-16 DIAGNOSIS — Z7982 Long term (current) use of aspirin: Secondary | ICD-10-CM | POA: Diagnosis not present

## 2018-09-16 DIAGNOSIS — S72002D Fracture of unspecified part of neck of left femur, subsequent encounter for closed fracture with routine healing: Secondary | ICD-10-CM | POA: Diagnosis not present

## 2018-09-16 DIAGNOSIS — Z794 Long term (current) use of insulin: Secondary | ICD-10-CM | POA: Diagnosis not present

## 2018-09-16 DIAGNOSIS — E1140 Type 2 diabetes mellitus with diabetic neuropathy, unspecified: Secondary | ICD-10-CM | POA: Diagnosis not present

## 2018-09-16 DIAGNOSIS — W19XXXD Unspecified fall, subsequent encounter: Secondary | ICD-10-CM | POA: Diagnosis not present

## 2018-09-17 ENCOUNTER — Ambulatory Visit: Payer: Medicare Other | Admitting: Orthopaedic Surgery

## 2018-09-17 ENCOUNTER — Telehealth: Payer: Self-pay | Admitting: Orthopaedic Surgery

## 2018-09-17 MED ORDER — HYDROCODONE-ACETAMINOPHEN 5-325 MG PO TABS: 1.0000 | ORAL_TABLET | Freq: Four times a day (QID) | ORAL | 0 refills | Status: DC | PRN

## 2018-09-17 NOTE — Telephone Encounter (Signed)
Patient aware of the below message  

## 2018-09-17 NOTE — Telephone Encounter (Signed)
Had to rs her appt from today to next week. She is requesting something for her leg pain. At least something to get her through until her appt next Wednesday. [facility]/[address]. pts callback [phone]

## 2018-09-17 NOTE — Telephone Encounter (Signed)
I did send in some more hydrocodone to her pharmacy.  She is now just over 3 months out from her injury and surgery.  She needs to start weaning from narcotics regardless.

## 2018-09-17 NOTE — Telephone Encounter (Signed)
Please advise 

## 2018-09-22 DIAGNOSIS — I10 Essential (primary) hypertension: Secondary | ICD-10-CM | POA: Diagnosis not present

## 2018-09-22 DIAGNOSIS — W19XXXD Unspecified fall, subsequent encounter: Secondary | ICD-10-CM | POA: Diagnosis not present

## 2018-09-22 DIAGNOSIS — E1140 Type 2 diabetes mellitus with diabetic neuropathy, unspecified: Secondary | ICD-10-CM | POA: Diagnosis not present

## 2018-09-22 DIAGNOSIS — S72002D Fracture of unspecified part of neck of left femur, subsequent encounter for closed fracture with routine healing: Secondary | ICD-10-CM | POA: Diagnosis not present

## 2018-09-22 DIAGNOSIS — Z7982 Long term (current) use of aspirin: Secondary | ICD-10-CM | POA: Diagnosis not present

## 2018-09-22 DIAGNOSIS — Z794 Long term (current) use of insulin: Secondary | ICD-10-CM | POA: Diagnosis not present

## 2018-09-23 ENCOUNTER — Other Ambulatory Visit: Payer: Self-pay

## 2018-09-23 ENCOUNTER — Ambulatory Visit (INDEPENDENT_AMBULATORY_CARE_PROVIDER_SITE_OTHER): Payer: Medicare Other | Admitting: Orthopaedic Surgery

## 2018-09-23 ENCOUNTER — Encounter: Payer: Self-pay | Admitting: Orthopaedic Surgery

## 2018-09-23 ENCOUNTER — Ambulatory Visit (INDEPENDENT_AMBULATORY_CARE_PROVIDER_SITE_OTHER): Payer: Medicare Other

## 2018-09-23 DIAGNOSIS — S72002D Fracture of unspecified part of neck of left femur, subsequent encounter for closed fracture with routine healing: Secondary | ICD-10-CM

## 2018-09-23 MED ORDER — METHYLPREDNISOLONE ACETATE 40 MG/ML IJ SUSP
40.0000 mg | INTRAMUSCULAR | Status: AC | PRN
  Administered 2018-09-23: 40 mg via INTRA_ARTICULAR

## 2018-09-23 MED ORDER — LIDOCAINE HCL 1 % IJ SOLN
3.0000 mL | INTRAMUSCULAR | Status: AC | PRN
  Administered 2018-09-23: 3 mL

## 2018-09-23 NOTE — Progress Notes (Signed)
Office Visit Note   Patient: [name]           Date of Birth: 1941/01/08           MRN: [id number] Visit Date: 09/23/2018              Requested by: Wenda Low, MD [address] [place], [place] [address] PCP: Wenda Low, MD   Assessment & Plan: Visit Diagnoses:  1. Closed fracture of proximal end of left femur with routine healing     Plan: She does show interval healing of her fracture but is not healed completely yet.  There is abundant callus formation and I can see a distal interlocking screw is broken so she is now dynamize in her fracture.  Hopefully we will see abundant callus formation and more healing over the next 4 weeks.  When we see her back in 4 weeks we will get a repeat 2 views of the left femur.  I did provide a steroid injection per her request in her right knee today.  After 4 weeks hopefully we can potentially get her into physical therapy to strengthen her lower extremities.  Again I will refill her Norco when she needs it.  Follow-Up Instructions: Return in about 4 weeks (around 10/21/2018).   Orders:  Orders Placed This Encounter  Procedures  . Large Joint Inj  . XR FEMUR MIN 2 VIEWS LEFT  . XR Knee 1-2 Views Right   No orders of the defined types were placed in this encounter.     Procedures: Large Joint Inj: R knee on 09/23/2018 3:36 PM Indications: diagnostic evaluation and pain Details: 22 G 1.5 in needle, superolateral approach  Arthrogram: No  Medications: 3 mL lidocaine 1 %; 40 mg methylPREDNISolone acetate 40 MG/ML Outcome: tolerated well, no immediate complications Procedure, treatment alternatives, risks and benefits explained, specific risks discussed. Consent was given by the patient. Immediately prior to procedure a time out was called to verify the correct patient, procedure, equipment, support staff and site/side marked as required. Patient was prepped and draped in the usual sterile fashion.        Clinical Data: No additional findings.   Subjective: Chief Complaint  Patient presents with  . Left Leg - Follow-up  The patient is a very active and pleasant 78 year old female who sustained a midshaft femur fracture with a low energy injury back on February 22.  It is now been just over 3 months since we placed intramedullary rod down her left femoral shaft.  She still having pain in her left thigh but also has right knee pain.  She has documented knee arthritis in that knee and has had steroid injections in the past.  Now she is putting more pressure through that right knee that is causing more pain.  She is having problems sleeping at night.  She is still been on Norco and I agree with her being on this still because she is not taking it every day but she still needs it.  She would like to have a steroid injection in her right knee today.  She still walking with a quad cane and frustrated that she is not made more progress.  HPI  Review of Systems She currently denies any headache, chest pain, shortness of breath, fever, chills, nausea, vomiting  Objective: Vital Signs: There were no vitals taken for this visit.  Physical Exam She is alert and orient x3 and in no acute distress but obvious  discomfort and walk with a cane slowly. Ortho Exam Examination of her right knee shows significant medial joint line tenderness with patellofemoral crepitation.  The knee is ligamentously stable and has good range of motion.  That right leg is more swollen the left leg as well.  The right hip and knee actually moves well but she does have mid thigh pain to be expected on her left leg. Specialty Comments:  No specialty comments available.  Imaging: Xr Femur Min 2 Views Left  Result Date: 09/23/2018 2 views left femur show a healing midshaft femur fracture with abundant callus formation.  There is been interval healing when compared to previous films but it is slow healing.  1 of the distal  interlocking screws is broken showing the fracture is dynamize in.  Xr Knee 1-2 Views Right  Result Date: 09/23/2018 2 views of the right knee show severe tricompartment arthritic changes.  There are articular osteophytes in all 3 compartments.  There is complete loss of the medial joint space.    PMFS History: Patient Active Problem List   Diagnosis Date Noted  . Fracture, proximal femur ([place]) 06/12/2018  . Femur fracture, left ([place]) 06/12/2018  . Acute blood loss as cause of postoperative anemia 03/30/2017  . Diabetic polyneuropathy associated with secondary diabetes mellitus ([place]) 03/30/2017  . Asthma 03/30/2017  . Dyslipidemia 03/26/2017  . Lumbar stenosis with neurogenic claudication 03/17/2017  . Spinal stenosis of lumbar region 03/03/2017  . Myoclonic jerking   . Nausea   . Acute encephalopathy 07/15/2016  . Lumbar radiculopathy 06/27/2016  . Hypothyroidism 06/26/2016  . Labile blood glucose   . Surgery, elective   . Post-operative pain   . Sickle cell trait ([place])   . Acute blood loss anemia   . History of back surgery   . AKI (acute kidney injury) ([place])   . Herniated nucleus pulposus, L2-3 06/25/2016  . Bilateral primary osteoarthritis of knee 03/26/2016  . Osteoarthritis, hand 03/26/2016  . Other insomnia 03/26/2016  . Memory disorder 02/16/2016  . Fibromyalgia 09/27/2014  . Nocturnal leg cramps 09/27/2014  . Body mass index (BMI) of 30.0-30.9 in adult 08/04/2014  . Neuropathy 03/10/2014  . Allergic rhinitis 03/10/2014  . Osteoporosis 03/10/2014  . Spinal stenosis of lumbar region with radiculopathy 02/28/2014  . Type 2 diabetes mellitus with complication, with long-term current use of insulin ([place]) 07/08/2013  . Hypokalemia 11/02/2012  . Essential hypertension, benign 11/02/2012  . Diabetes mellitus with neuropathy ([place]) 10/29/2012  . Hyperlipidemia 10/29/2012  . Spondylolisthesis of lumbar region 03/19/2011  . Lumbar radicular pain 03/19/2011   Past  Medical History:  Diagnosis Date  . Acute blood loss anemia   . Acute encephalopathy 07/15/2016  . AKI (acute kidney injury) ([place])   . Anemia    has sickle cell trait  . Anxiety   . Arthritis   . Asthma    has used inhaler in past for asthmatic bronchitis, last time- early 2012  . Bilateral primary osteoarthritis of knee 03/26/2016  . Complication of anesthesia    wakes up shaking  . Diabetes mellitus   . Diabetes mellitus with neuropathy ([place]) 10/29/2012  . Diverticulitis   . Dyslipidemia   . Encephalopathy 06/2016   due to medications after surgery  . Fibromyalgia   . GERD (gastroesophageal reflux disease)    occas. use of  Prilosec  . Heart murmur    sees Dr. Montez Morita, last seen- early 2012  . Herniated nucleus pulposus, L2-3 06/25/2016  . History  of back surgery   . Hypertension    02/2010- stress test /w PCP  . Hypothyroidism   . Loose bowel movements 12/2016  . Lumbar radiculopathy 06/27/2016  . Lumbar stenosis with neurogenic claudication 03/17/2017  . Memory disorder 02/16/2016  . Myoclonic jerking   . Neuromuscular disorder (H[facility])    lumbar radiculopathy, lumbago  . Nocturnal leg cramps 09/27/2014  . Osteoporosis 03/10/2014  . Pneumonia   . Sickle cell trait ([place])   . Sleep apnea    borderline sleep apnea, states she no longer uses, early 2012- stopped using   . Spinal stenosis of lumbar region with radiculopathy 02/28/2014  . Spondylolisthesis of lumbar region 03/19/2011  . Type II or unspecified type diabetes mellitus without mention of complication, uncontrolled 07/08/2013    Family History  Problem Relation Age of Onset  . Ovarian cancer Mother   . Cancer - Prostate Father   . Breast cancer Paternal Aunt   . Multiple myeloma Paternal Aunt   . Anesthesia problems Neg Hx   . Hypotension Neg Hx   . Malignant hyperthermia Neg Hx   . Pseudochol deficiency Neg Hx     Past Surgical History:  Procedure Laterality Date  . ABDOMINAL HYSTERECTOMY    . adb.cyst      ovarian cyst  . BACK SURGERY     2012, 2015 (3 total)  . BACK SURGERY  2018   02/24/2017  . COLONOSCOPY    . EYE SURGERY     macular degeneration treatment - injections  . FEMUR IM NAIL Left 06/13/2018   Procedure: INTRAMEDULLARY (IM) NAIL FEMORAL;  Surgeon: [name], MD;  Location: [facility];  Service: Orthopedics;  Laterality: Left;  . OVARIAN CYST SURGERY    . POSTERIOR LUMBAR FUSION 4 LEVEL N/A 03/17/2017   Procedure: Decompression of Lumbar One-Two with Thoracic Ten to Lumbar Two Fusion;  Surgeon: Kristeen Miss, MD;  Location: [facility];  Service: Neurosurgery;  Laterality: N/A;  Decompression of L1-2 with T10 to L2 Fusion   Social History   Occupational History  . Occupation: Retired  Tobacco Use  . Smoking status: Former Smoker    Packs/day: 0.10    Years: 30.00    Pack years: 3.00    Types: Cigarettes    Last attempt to quit: 2005    Years since quitting: 15.4  . Smokeless tobacco: Never Used  Substance and Sexual Activity  . Alcohol use: Yes    Comment: wine /w dinner on occas.  . Drug use: Never  . Sexual activity: Never

## 2018-09-24 ENCOUNTER — Other Ambulatory Visit: Payer: Self-pay | Admitting: Endocrinology

## 2018-09-30 ENCOUNTER — Telehealth: Payer: Self-pay | Admitting: Orthopaedic Surgery

## 2018-09-30 DIAGNOSIS — Z794 Long term (current) use of insulin: Secondary | ICD-10-CM | POA: Diagnosis not present

## 2018-09-30 DIAGNOSIS — E1140 Type 2 diabetes mellitus with diabetic neuropathy, unspecified: Secondary | ICD-10-CM | POA: Diagnosis not present

## 2018-09-30 DIAGNOSIS — I10 Essential (primary) hypertension: Secondary | ICD-10-CM | POA: Diagnosis not present

## 2018-09-30 DIAGNOSIS — W19XXXD Unspecified fall, subsequent encounter: Secondary | ICD-10-CM | POA: Diagnosis not present

## 2018-09-30 DIAGNOSIS — S72002D Fracture of unspecified part of neck of left femur, subsequent encounter for closed fracture with routine healing: Secondary | ICD-10-CM | POA: Diagnosis not present

## 2018-09-30 DIAGNOSIS — Z7982 Long term (current) use of aspirin: Secondary | ICD-10-CM | POA: Diagnosis not present

## 2018-09-30 NOTE — Telephone Encounter (Signed)
Verbal order left on VM  

## 2018-09-30 NOTE — Telephone Encounter (Signed)
Kelly/[facility]/PT [phone] called needing verbal orders 1 week 4   She has a secured line and a VM can be left

## 2018-10-03 DIAGNOSIS — Z7982 Long term (current) use of aspirin: Secondary | ICD-10-CM | POA: Diagnosis not present

## 2018-10-03 DIAGNOSIS — W19XXXD Unspecified fall, subsequent encounter: Secondary | ICD-10-CM | POA: Diagnosis not present

## 2018-10-03 DIAGNOSIS — S72002D Fracture of unspecified part of neck of left femur, subsequent encounter for closed fracture with routine healing: Secondary | ICD-10-CM | POA: Diagnosis not present

## 2018-10-03 DIAGNOSIS — E1140 Type 2 diabetes mellitus with diabetic neuropathy, unspecified: Secondary | ICD-10-CM | POA: Diagnosis not present

## 2018-10-03 DIAGNOSIS — I10 Essential (primary) hypertension: Secondary | ICD-10-CM | POA: Diagnosis not present

## 2018-10-03 DIAGNOSIS — Z794 Long term (current) use of insulin: Secondary | ICD-10-CM | POA: Diagnosis not present

## 2018-10-08 DIAGNOSIS — S72002D Fracture of unspecified part of neck of left femur, subsequent encounter for closed fracture with routine healing: Secondary | ICD-10-CM | POA: Diagnosis not present

## 2018-10-08 DIAGNOSIS — W19XXXD Unspecified fall, subsequent encounter: Secondary | ICD-10-CM | POA: Diagnosis not present

## 2018-10-08 DIAGNOSIS — I10 Essential (primary) hypertension: Secondary | ICD-10-CM | POA: Diagnosis not present

## 2018-10-08 DIAGNOSIS — E1140 Type 2 diabetes mellitus with diabetic neuropathy, unspecified: Secondary | ICD-10-CM | POA: Diagnosis not present

## 2018-10-08 DIAGNOSIS — Z794 Long term (current) use of insulin: Secondary | ICD-10-CM | POA: Diagnosis not present

## 2018-10-08 DIAGNOSIS — Z7982 Long term (current) use of aspirin: Secondary | ICD-10-CM | POA: Diagnosis not present

## 2018-10-13 DIAGNOSIS — E1140 Type 2 diabetes mellitus with diabetic neuropathy, unspecified: Secondary | ICD-10-CM | POA: Diagnosis not present

## 2018-10-13 DIAGNOSIS — Z794 Long term (current) use of insulin: Secondary | ICD-10-CM | POA: Diagnosis not present

## 2018-10-13 DIAGNOSIS — I10 Essential (primary) hypertension: Secondary | ICD-10-CM | POA: Diagnosis not present

## 2018-10-13 DIAGNOSIS — S72002D Fracture of unspecified part of neck of left femur, subsequent encounter for closed fracture with routine healing: Secondary | ICD-10-CM | POA: Diagnosis not present

## 2018-10-13 DIAGNOSIS — Z7982 Long term (current) use of aspirin: Secondary | ICD-10-CM | POA: Diagnosis not present

## 2018-10-13 DIAGNOSIS — W19XXXD Unspecified fall, subsequent encounter: Secondary | ICD-10-CM | POA: Diagnosis not present

## 2018-10-14 ENCOUNTER — Other Ambulatory Visit (INDEPENDENT_AMBULATORY_CARE_PROVIDER_SITE_OTHER): Payer: Medicare Other

## 2018-10-14 ENCOUNTER — Other Ambulatory Visit: Payer: Medicare Other

## 2018-10-14 ENCOUNTER — Other Ambulatory Visit: Payer: Self-pay

## 2018-10-14 DIAGNOSIS — E063 Autoimmune thyroiditis: Secondary | ICD-10-CM

## 2018-10-14 DIAGNOSIS — Z794 Long term (current) use of insulin: Secondary | ICD-10-CM | POA: Diagnosis not present

## 2018-10-14 DIAGNOSIS — E1165 Type 2 diabetes mellitus with hyperglycemia: Secondary | ICD-10-CM

## 2018-10-14 LAB — LIPID PANEL
Cholesterol: 250 mg/dL — ABNORMAL HIGH (ref 0–200)
HDL: 51.4 mg/dL (ref >39.00)
LDL Cholesterol: 169 mg/dL — ABNORMAL HIGH (ref 0–99)
NonHDL: 198.27
Total CHOL/HDL Ratio: 5
Triglycerides: 147 mg/dL (ref 0.0–149.0)
VLDL: 29.4 mg/dL (ref 0.0–40.0)

## 2018-10-14 LAB — COMPREHENSIVE METABOLIC PANEL
ALT: 10 U/L (ref 0–35)
AST: 14 U/L (ref 0–37)
Albumin: 4.1 g/dL (ref 3.5–5.2)
Alkaline Phosphatase: 116 U/L (ref 39–117)
BUN: 14 mg/dL (ref 6–23)
CO2: 30 mEq/L (ref 19–32)
Calcium: 9.3 mg/dL (ref 8.4–10.5)
Chloride: 104 mEq/L (ref 96–112)
Creatinine, Ser: 0.83 mg/dL (ref 0.40–1.20)
GFR: 80.28 mL/min (ref >60.00)
Glucose, Bld: 95 mg/dL (ref 70–99)
Potassium: 3.8 mEq/L (ref 3.5–5.1)
Sodium: 140 mEq/L (ref 135–145)
Total Bilirubin: 0.5 mg/dL (ref 0.2–1.2)
Total Protein: 6.9 g/dL (ref 6.0–8.3)

## 2018-10-14 LAB — TSH: TSH: 4.85 u[IU]/mL — ABNORMAL HIGH (ref 0.35–4.50)

## 2018-10-14 LAB — T4, FREE: Free T4: 0.97 ng/dL (ref 0.60–1.60)

## 2018-10-15 ENCOUNTER — Encounter: Payer: Self-pay | Admitting: Endocrinology

## 2018-10-15 ENCOUNTER — Ambulatory Visit (INDEPENDENT_AMBULATORY_CARE_PROVIDER_SITE_OTHER): Payer: Medicare Other | Admitting: Endocrinology

## 2018-10-15 VITALS — BP 150/88 | HR 78 | Temp 99.2°F | Ht 65.0 in | Wt 175.2 lb

## 2018-10-15 DIAGNOSIS — I10 Essential (primary) hypertension: Secondary | ICD-10-CM

## 2018-10-15 DIAGNOSIS — E1165 Type 2 diabetes mellitus with hyperglycemia: Secondary | ICD-10-CM | POA: Diagnosis not present

## 2018-10-15 DIAGNOSIS — E782 Mixed hyperlipidemia: Secondary | ICD-10-CM | POA: Diagnosis not present

## 2018-10-15 DIAGNOSIS — E063 Autoimmune thyroiditis: Secondary | ICD-10-CM

## 2018-10-15 DIAGNOSIS — Z794 Long term (current) use of insulin: Secondary | ICD-10-CM

## 2018-10-15 LAB — HEMOGLOBIN A1C: Hgb A1c MFr Bld: 6.4 % (ref 4.6–6.5)

## 2018-10-15 NOTE — Progress Notes (Signed)
Patient ID: [name], female   DOB: 09/06/1940, 78 y.o.   MRN: [id number]      Reason for Appointment: Endocrinology follow-up  History of Present Illness    PROBLEM 1: Type 2 diabetes mellitus, date of diagnosis: 1998.   Prior history: She had previously been treated with Byetta, Glumetza, Amaryl, Victoza and  Onglyza However because of inadequate control and intolerance to drugs she was finally given pre-meal insulin along with Amaryl, Glumetza eventually stopped because of side effects and also Lantus added  Recent history:  The insulin regimen is: Novolog 4 at breakfast and 9 lunch, 7 acs Tresiba 22 units at lunch  Oral agents: Farxiga 5 mg daily  Her A1c is now 6.4, previously has been mostly between about 6.2 -7.4  Current blood sugar patterns and problems with management:  She has checked blood sugars mostly in the mornings and rarely after meals  She has a few high readings when she is eating sweets like ice cream or other increased carbohydrate intake  She has lost weight since starting [place] and she is pleased with this  Currently her largest meal is at lunchtime and she is taking a little less insulin with starting [place] but not adjusting it and often she is eating more carbohydrate or sweets  Fasting blood sugars are excellent although she thinks she feels hypoglycemic when they are in the 80s  Getting physical therapy and not much other exercise  Side effects from diabetes medications: Victoza caused nausea.  Metformin  Monitors blood glucose: 1-2 times a day, readings and MEDIAN numbers as below Glucometer: One Touch ultra 2.     PRE-MEAL Fasting Lunch Dinner Bedtime Overall  Glucose range:  82-143  97-263  95-229    Mean/median:  108  140    109   POST-MEAL PC Breakfast PC Lunch PC Dinner  Glucose range:    124-199  Mean/median:        Meals: 2-3 meals per day at 10 am. 2 pm and 6-7 pm but inconsistent schedule.  Breakfast may  be [place] sausage and egg with toast, occasionally oatmeal or cereal   Physical activity: exercise: Minimal, previously doing water aerobics 3/7 days a week  Certified Diabetes Educator visit: Most recent: 7/13.  Dietician visit: Most recent: 3/13.   Wt Readings from Last 3 Encounters:  10/15/18 175 lb 3.2 oz (79.5 kg)  07/02/18 183 lb (83 kg)  06/16/18 183 lb 6.4 oz (75.1 kg)   Complications: Neuropathy  LABS:  Lab Results  Component Value Date   HGBA1C 6.4 10/14/2018   HGBA1C 7.3 (H) 04/23/2018   HGBA1C 6.4 01/22/2018   Lab Results  Component Value Date   MICROALBUR <0.7 01/13/2017   LDLCALC 169 (H) 10/14/2018   CREATININE 0.83 10/14/2018   Lab Results  Component Value Date   FRUCTOSAMINE 283 06/09/2018   FRUCTOSAMINE 296 (H) 05/16/2017   FRUCTOSAMINE 322 (H) 10/10/2016    Multiple other issues are addressed in review of systems  Lab on 10/14/2018  Component Date Value Ref Range Status   Cholesterol 10/14/2018 250* 0 - 200 mg/dL Final   ATP III Classification       Desirable:  < 200 mg/dL               Borderline High:  200 - 239 mg/dL          High:  > = 240 mg/dL   Triglycerides 10/14/2018 147.0  0.0 - 149.0 mg/dL Final  Normal:  <150 mg/dLBorderline High:  150 - 199 mg/dL   HDL 10/14/2018 51.40  >39.00 mg/dL Final   VLDL 10/14/2018 29.4  0.0 - 40.0 mg/dL Final   LDL Cholesterol 10/14/2018 169* 0 - 99 mg/dL Final   Total CHOL/HDL Ratio 10/14/2018 5   Final                  Men          Women1/2 Average Risk     3.4          3.3Average Risk          5.0          4.42X Average Risk          9.6          7.13X Average Risk          15.0          11.0                       NonHDL 10/14/2018 198.27   Final   NOTE:  Non-HDL goal should be 30 mg/dL higher than patient's LDL goal (i.e. LDL goal of < 70 mg/dL, would have non-HDL goal of < 100 mg/dL)   Free T4 10/14/2018 0.97  0.60 - 1.60 ng/dL Final   Comment: Specimens from patients who are undergoing biotin  therapy and /or ingesting biotin supplements may contain high levels of biotin.  The higher biotin concentration in these specimens interferes with this Free T4 assay.  Specimens that contain high levels  of biotin may cause false high results for this Free T4 assay.  Please interpret results in light of the total clinical presentation of the patient.     TSH 10/14/2018 4.85* 0.35 - 4.50 uIU/mL Final   Sodium 10/14/2018 140  135 - 145 mEq/L Final   Potassium 10/14/2018 3.8  3.5 - 5.1 mEq/L Final   Chloride 10/14/2018 104  96 - 112 mEq/L Final   CO2 10/14/2018 30  19 - 32 mEq/L Final   Glucose, Bld 10/14/2018 95  70 - 99 mg/dL Final   BUN 10/14/2018 14  6 - 23 mg/dL Final   Creatinine, Ser 10/14/2018 0.83  0.40 - 1.20 mg/dL Final   Total Bilirubin 10/14/2018 0.5  0.2 - 1.2 mg/dL Final   Alkaline Phosphatase 10/14/2018 116  39 - 117 U/L Final   AST 10/14/2018 14  0 - 37 U/L Final   ALT 10/14/2018 10  0 - 35 U/L Final   Total Protein 10/14/2018 6.9  6.0 - 8.3 g/dL Final   Albumin 10/14/2018 4.1  3.5 - 5.2 g/dL Final   Calcium 10/14/2018 9.3  8.4 - 10.5 mg/dL Final   GFR 10/14/2018 80.28  >60.00 mL/min Final   Hgb A1c MFr Bld 10/14/2018 6.4  4.6 - 6.5 % Final   Glycemic Control Guidelines for People with Diabetes:Non Diabetic:  <6%Goal of Therapy: <7%Additional Action Suggested:  >8%       Allergies as of 10/15/2018      Reactions   Cymbalta [duloxetine Hcl] Diarrhea, Other (See Comments)   Dizziness, headache, irritability   Baclofen    jerks    Betadine [povidone Iodine] Swelling, Other (See Comments)   SWELLING REACTION DESCRIPTION/SEVERITY UNSPECIFIED  Reaction to betadine eye drops   Adhesive [tape] Rash   Lipitor [atorvastatin] Rash      Medication List       Accurate as of October 15, 2018  2:27 PM. If you have any questions, ask your nurse or doctor.        STOP taking these medications   aspirin 325 MG EC tablet Stopped by: [name], MD     Colesevelam HCl 3.75 g Pack Stopped by: [name], MD   methocarbamol 500 MG tablet Commonly known as: ROBAXIN Stopped by: [name], MD   oxyCODONE 5 MG immediate release tablet Commonly known as: Oxy IR/ROXICODONE Stopped by: [name], MD   potassium chloride 10 MEQ tablet Commonly known as: K-DUR Stopped by: [name], MD   ProAir RespiClick 956 [phone] Base) MCG/ACT Aepb Generic drug: Albuterol Sulfate Stopped by: [name], MD     TAKE these medications   acetaminophen 500 MG tablet Commonly known as: TYLENOL Take 1,000 mg by mouth every 8 (eight) hours as needed for mild pain or moderate pain.   amLODipine 10 MG tablet Commonly known as: NORVASC Take 1 tablet (10 mg total) by mouth daily.   cetirizine 10 MG tablet Commonly known as: ZYRTEC Take 1 tablet (10 mg total) by mouth daily as needed for allergies.   dapagliflozin propanediol 5 MG Tabs tablet Commonly known as: Farxiga Take 5 mg by mouth daily.   fluticasone 50 MCG/ACT nasal spray Commonly known as: FLONASE Place 1 spray into both nostrils daily as needed for allergies.   gabapentin 600 MG tablet Commonly known as: Neurontin Take 1 tablet (600 mg total) by mouth 3 (three) times daily.   glucose blood test strip Commonly known as: ONE TOUCH ULTRA TEST Use as instructed to check blood sugar 4 times daily. DX:E11.65   HYDROcodone-acetaminophen 5-325 MG tablet Commonly known as: NORCO/VICODIN Take 1 tablet by mouth every 6 (six) hours as needed for moderate pain.   ibuprofen 800 MG tablet Commonly known as: ADVIL Take 800 mg by mouth every 8 (eight) hours as needed.   insulin aspart 100 UNIT/ML FlexPen Commonly known as: NovoLOG FlexPen INJECT 6 UNITS UNDER THE SKIN BEFORE BREAKFAST, 10 UNITS BEFORE LUNCH, AND 9 UNITS BEFORE SUPPER What changed: additional instructions   Insulin Pen Needle 32G X 4 MM Misc Commonly known as: BD Pen Needle Nano U/F USE AS DIRECTED   levothyroxine 50 MCG  tablet Commonly known as: SYNTHROID Take 1 tablet (50 mcg total) by mouth daily.   MAGNESIUM PO Take 250 mg by mouth 2 (two) times daily.   Symbicort 80-4.5 MCG/ACT inhaler Generic drug: budesonide-formoterol Inhale 2 puffs into the lungs 2 (two) times daily as needed (shortness of breath).   Tyler Aas FlexTouch 100 UNIT/ML Sopn FlexTouch Pen Generic drug: insulin degludec INJECT 24 UNITS UNDER THE SKIN EVERY DAY       Allergies:  Allergies  Allergen Reactions   Cymbalta [Duloxetine Hcl] Diarrhea and Other (See Comments)    Dizziness, headache, irritability   Baclofen     jerks    Betadine [Povidone Iodine] Swelling and Other (See Comments)    SWELLING REACTION DESCRIPTION/SEVERITY UNSPECIFIED  Reaction to betadine eye drops   Adhesive [Tape] Rash   Lipitor [Atorvastatin] Rash    Past Medical History:  Diagnosis Date   Acute blood loss anemia    Acute encephalopathy 07/15/2016   AKI (acute kidney injury) ([place])    Anemia    has sickle cell trait   Anxiety    Arthritis    Asthma    has used inhaler in past for asthmatic bronchitis, last time- early 2012   Bilateral primary osteoarthritis of knee  57/11/4694   Complication of anesthesia    wakes up shaking   Diabetes mellitus    Diabetes mellitus with neuropathy ([place]) 10/29/2012   Diverticulitis    Dyslipidemia    Encephalopathy 06/2016   due to medications after surgery   Fibromyalgia    GERD (gastroesophageal reflux disease)    occas. use of  Prilosec   Heart murmur    sees Dr. Montez Morita, last seen- early 2012   Herniated nucleus pulposus, L2-3 06/25/2016   History of back surgery    Hypertension    02/2010- stress test /w PCP   Hypothyroidism    Loose bowel movements 12/2016   Lumbar radiculopathy 06/27/2016   Lumbar stenosis with neurogenic claudication 03/17/2017   Memory disorder 02/16/2016   Myoclonic jerking    Neuromuscular disorder ([facility])    lumbar radiculopathy, lumbago     Nocturnal leg cramps 09/27/2014   Osteoporosis 03/10/2014   Pneumonia    Sickle cell trait ([place])    Sleep apnea    borderline sleep apnea, states she no longer uses, early 2012- stopped using    Spinal stenosis of lumbar region with radiculopathy 02/28/2014   Spondylolisthesis of lumbar region 03/19/2011   Type II or unspecified type diabetes mellitus without mention of complication, uncontrolled 07/08/2013    Past Surgical History:  Procedure Laterality Date   ABDOMINAL HYSTERECTOMY     adb.cyst     ovarian cyst   BACK SURGERY     2012, 2015 (3 total)   BACK SURGERY  2018   02/24/2017   COLONOSCOPY     EYE SURGERY     macular degeneration treatment - injections   FEMUR IM NAIL Left 06/13/2018   Procedure: INTRAMEDULLARY (IM) NAIL FEMORAL;  Surgeon: [name], MD;  Location: [facility];  Service: Orthopedics;  Laterality: Left;   OVARIAN CYST SURGERY     POSTERIOR LUMBAR FUSION 4 LEVEL N/A 03/17/2017   Procedure: Decompression of Lumbar One-Two with Thoracic Ten to Lumbar Two Fusion;  Surgeon: Kristeen Miss, MD;  Location: [facility];  Service: Neurosurgery;  Laterality: N/A;  Decompression of L1-2 with T10 to L2 Fusion    Family History  Problem Relation Age of Onset   Ovarian cancer Mother    Cancer - Prostate Father    Breast cancer Paternal Aunt    Multiple myeloma Paternal Aunt    Anesthesia problems Neg Hx    Hypotension Neg Hx    Malignant hyperthermia Neg Hx    Pseudochol deficiency Neg Hx     Social History:  reports that she quit smoking about 15 years ago. Her smoking use included cigarettes. She has a 3.00 pack-year smoking history. She has never used smokeless tobacco. She reports current alcohol use. She reports that she does not use drugs.  ROS    NEUROPATHY: She has had tingling in feet and legs especially at night. She is taking gabapentin without consistent relief at night At times she will take Lyrica with somewhat better  relief at night This was also prescribed as needed for fibromyalgia by her rheumatologist  Hyperlipidemia:   The lipid abnormality consists of elevated LDL , high triglyceride and did not tolerate Crestor or lovastatin She thinks they make her cramps worse Livalo is not covered  She is off Zetia, previously did think she was having muscle cramps  Her lipids are poorly controlled without any treatment   Lab Results  Component Value Date   CHOL 250 (H) 10/14/2018   HDL  51.40 10/14/2018   LDLCALC 169 (H) 10/14/2018   LDLDIRECT 115.0 12/18/2016   TRIG 147.0 10/14/2018   CHOLHDL 5 10/14/2018      HYPERTENSION:  Has been present for several years.  This is usually controlled with taking Avapro 150 from her PCP Although her blood pressure was low normal initially it was high when checked standing up   POTASSIUM levels: Recently normal . Has a history of leg and muscle cramps which are better with magnesium supplements usually   Lab Results  Component Value Date   CREATININE 0.83 10/14/2018   BUN 14 10/14/2018   NA 140 10/14/2018   K 3.8 10/14/2018   CL 104 10/14/2018   CO2 30 10/14/2018       Hypothyroidism  She had a relatively high TSH as of 4/17 and was empirically given Synthroid 25 g Subsequently has been on 50 mcg She does not think she is feeling any unusual fatigue and overall feels fairly good However TSH is slightly high at 4.85  TSH history as follows   Lab Results  Component Value Date   TSH 4.85 (H) 10/14/2018   TSH 3.54 06/09/2018   TSH 5.38 (H) 04/23/2018   FREET4 0.97 10/14/2018   FREET4 0.78 02/22/2016         Examination:   BP 118/62 (BP Location: Left Arm, Patient Position: Sitting, Cuff Size: Normal)    Pulse 78    Temp 99.2 F (37.3 C) (Oral)    Ht _0  (1.651 m)    Wt 175 lb 3.2 oz (79.5 kg)    LMP  (LMP Unknown)    SpO2 94%    BMI 29.15 kg/m   Body mass index is 29.15 kg/m.     Assesment/Plan:   1. DIABETES type 2   See  history of present illness for detailed discussion of management, blood sugar patterns and problems identified  She is on basal bolus insulin and Farxiga  Last A1c was 7.3 and now is 6.4  Blood sugars are generally better but difficult to assess pattern because of inadequate monitoring especially after meals Again has difficulty focusing on how much to take for to cover her meals, snacks and sweets like ice cream Highest blood sugar 263  Overall benefiting from [place] and losing weight Although she has no hypoglycemia she feels that her sugar of 82 in the morning is too low For this reason we will try to reduce her Tresiba to 20 units until 22  Discussed adjusting mealtime insulin 2 to 4 units based on amount of carbohydrate and add extra up to 4 units for sweets like ice cream She will also take 4 units of Humalog extra if she is having ice cream in the afternoon or another dessert  No side effects from [place] and no renal dysfunction, she can continue 5 mg for now However encouraged her to try and increase her walking even if she can do some indoor exercises  3. Hypertension: Blood pressure is variable today Usually controlled with taking Avapro and no symptoms of orthostatic hypotension She will continue on Avapro unchanged and follow-up with PCP      4.   Hypothyroidism: She is taking 50 mcg levothyroxine and although she has a high TSH she is feeling fairly good and will continue the same dose considering her age 1 her TSH again on the next visit  Total visit time for evaluation and management of multiple problems and counseling =25 minutes   There are  no Patient Instructions on file for this visit.       [name] 10/15/2018, 2:27 PM

## 2018-10-15 NOTE — Patient Instructions (Addendum)
Check blood sugars on waking up days a week  Also check blood sugars about 2 hours after meals and do this after different meals by rotation  Recommended blood sugar levels on waking up are 90-130 and about 2 hours after meal is 130-160  Please bring your blood sugar monitor to each visit, thank you  [address]

## 2018-10-19 ENCOUNTER — Other Ambulatory Visit: Payer: Self-pay | Admitting: Neurology

## 2018-10-20 DIAGNOSIS — Z794 Long term (current) use of insulin: Secondary | ICD-10-CM | POA: Diagnosis not present

## 2018-10-20 DIAGNOSIS — S72002D Fracture of unspecified part of neck of left femur, subsequent encounter for closed fracture with routine healing: Secondary | ICD-10-CM | POA: Diagnosis not present

## 2018-10-20 DIAGNOSIS — W19XXXD Unspecified fall, subsequent encounter: Secondary | ICD-10-CM | POA: Diagnosis not present

## 2018-10-20 DIAGNOSIS — E1140 Type 2 diabetes mellitus with diabetic neuropathy, unspecified: Secondary | ICD-10-CM | POA: Diagnosis not present

## 2018-10-20 DIAGNOSIS — Z7982 Long term (current) use of aspirin: Secondary | ICD-10-CM | POA: Diagnosis not present

## 2018-10-20 DIAGNOSIS — I10 Essential (primary) hypertension: Secondary | ICD-10-CM | POA: Diagnosis not present

## 2018-10-22 ENCOUNTER — Ambulatory Visit (INDEPENDENT_AMBULATORY_CARE_PROVIDER_SITE_OTHER): Payer: Medicare Other

## 2018-10-22 ENCOUNTER — Encounter: Payer: Self-pay | Admitting: Orthopaedic Surgery

## 2018-10-22 ENCOUNTER — Other Ambulatory Visit: Payer: Self-pay

## 2018-10-22 ENCOUNTER — Ambulatory Visit (INDEPENDENT_AMBULATORY_CARE_PROVIDER_SITE_OTHER): Payer: Medicare Other | Admitting: Orthopaedic Surgery

## 2018-10-22 DIAGNOSIS — S72002D Fracture of unspecified part of neck of left femur, subsequent encounter for closed fracture with routine healing: Secondary | ICD-10-CM

## 2018-10-22 MED ORDER — IBUPROFEN 800 MG PO TABS: 800.0000 mg | ORAL_TABLET | Freq: Three times a day (TID) | ORAL | 3 refills | Status: DC | PRN

## 2018-10-22 NOTE — Progress Notes (Signed)
The patient is 4-1/2 months status post intramedullary nail placement in the left femur due to a midshaft femur fracture.  She is 78 years old and will turn 67 next month.  She is asking for just 800 mg of ibuprofen at this standpoint.  She still has residual thigh pain is ambulating using a cane.  She has known bone-on-bone arthritis of her right knee.  She is requesting outpatient physical therapy at this point at [facility] to strengthen both of her legs.  On exam she has excellent range of motion of both hips and good range of motion of both knees.  There is some residual thigh pain.  X-rays of the left femur show residual healing of the fracture.  The proximal of the 2 distal interlocking screws is broken showing the patient is dynamize in her fracture.  There is been interval healing.  At this point we will send her to outpatient physical therapy to strengthen her lower extremities and work on her balance and coordination.  I like to see her back in about 2 months with a repeat AP and lateral of the left femur.

## 2018-10-26 NOTE — Progress Notes (Deleted)
Office Visit Note  Patient: [name]             Date of Birth: 1940/12/21           MRN: [id number]             PCP: Wenda Low, MD Referring: Wenda Low, MD Visit Date: 11/06/2018 Occupation: '@GUAROCC' @  Subjective:  No chief complaint on file.   History of Present Illness: [name] is a 78 y.o. female ***   Activities of Daily Living:  Patient reports morning stiffness for *** {minute/hour:19697}.   Patient {ACTIONS;DENIES/REPORTS:21021675::"Denies"} nocturnal pain.  Difficulty dressing/grooming: {ACTIONS;DENIES/REPORTS:21021675::"Denies"} Difficulty climbing stairs: {ACTIONS;DENIES/REPORTS:21021675::"Denies"} Difficulty getting out of chair: {ACTIONS;DENIES/REPORTS:21021675::"Denies"} Difficulty using hands for taps, buttons, cutlery, and/or writing: {ACTIONS;DENIES/REPORTS:21021675::"Denies"}  No Rheumatology ROS completed.   PMFS History:  Patient Active Problem List   Diagnosis Date Noted  . Fracture, proximal femur ([place]) 06/12/2018  . Femur fracture, left ([place]) 06/12/2018  . Acute blood loss as cause of postoperative anemia 03/30/2017  . Diabetic polyneuropathy associated with secondary diabetes mellitus ([place]) 03/30/2017  . Asthma 03/30/2017  . Dyslipidemia 03/26/2017  . Lumbar stenosis with neurogenic claudication 03/17/2017  . Spinal stenosis of lumbar region 03/03/2017  . Myoclonic jerking   . Nausea   . Acute encephalopathy 07/15/2016  . Lumbar radiculopathy 06/27/2016  . Hypothyroidism 06/26/2016  . Labile blood glucose   . Surgery, elective   . Post-operative pain   . Sickle cell trait ([place])   . Acute blood loss anemia   . History of back surgery   . AKI (acute kidney injury) ([place])   . Herniated nucleus pulposus, L2-3 06/25/2016  . Bilateral primary osteoarthritis of knee 03/26/2016  . Osteoarthritis, hand 03/26/2016  . Other insomnia 03/26/2016  . Memory disorder 02/16/2016  . Fibromyalgia 09/27/2014  . Nocturnal leg  cramps 09/27/2014  . Body mass index (BMI) of 30.0-30.9 in adult 08/04/2014  . Neuropathy 03/10/2014  . Allergic rhinitis 03/10/2014  . Osteoporosis 03/10/2014  . Spinal stenosis of lumbar region with radiculopathy 02/28/2014  . Type 2 diabetes mellitus with complication, with long-term current use of insulin ([place]) 07/08/2013  . Hypokalemia 11/02/2012  . Essential hypertension, benign 11/02/2012  . Diabetes mellitus with neuropathy ([place]) 10/29/2012  . Hyperlipidemia 10/29/2012  . Spondylolisthesis of lumbar region 03/19/2011  . Lumbar radicular pain 03/19/2011    Past Medical History:  Diagnosis Date  . Acute blood loss anemia   . Acute encephalopathy 07/15/2016  . AKI (acute kidney injury) ([place])   . Anemia    has sickle cell trait  . Anxiety   . Arthritis   . Asthma    has used inhaler in past for asthmatic bronchitis, last time- early 2012  . Bilateral primary osteoarthritis of knee 03/26/2016  . Complication of anesthesia    wakes up shaking  . Diabetes mellitus   . Diabetes mellitus with neuropathy ([place]) 10/29/2012  . Diverticulitis   . Dyslipidemia   . Encephalopathy 06/2016   due to medications after surgery  . Fibromyalgia   . GERD (gastroesophageal reflux disease)    occas. use of  Prilosec  . Heart murmur    sees Dr. Montez Morita, last seen- early 2012  . Herniated nucleus pulposus, L2-3 06/25/2016  . History of back surgery   . Hypertension    02/2010- stress test /w PCP  . Hypothyroidism   . Loose bowel movements 12/2016  . Lumbar radiculopathy 06/27/2016  . Lumbar stenosis with neurogenic claudication 03/17/2017  .  Memory disorder 02/16/2016  . Myoclonic jerking   . Neuromuscular disorder (HCC)    lumbar radiculopathy, lumbago  . Nocturnal leg cramps 09/27/2014  . Osteoporosis 03/10/2014  . Pneumonia   . Sickle cell trait ([place])   . Sleep apnea    borderline sleep apnea, states she no longer uses, early 2012- stopped using   . Spinal stenosis of lumbar region  with radiculopathy 02/28/2014  . Spondylolisthesis of lumbar region 03/19/2011  . Type II or unspecified type diabetes mellitus without mention of complication, uncontrolled 07/08/2013    Family History  Problem Relation Age of Onset  . Ovarian cancer Mother   . Cancer - Prostate Father   . Breast cancer Paternal Aunt   . Multiple myeloma Paternal Aunt   . Anesthesia problems Neg Hx   . Hypotension Neg Hx   . Malignant hyperthermia Neg Hx   . Pseudochol deficiency Neg Hx    Past Surgical History:  Procedure Laterality Date  . ABDOMINAL HYSTERECTOMY    . adb.cyst     ovarian cyst  . BACK SURGERY     2012, 2015 (3 total)  . BACK SURGERY  2018   02/24/2017  . COLONOSCOPY    . EYE SURGERY     macular degeneration treatment - injections  . FEMUR IM NAIL Left 06/13/2018   Procedure: INTRAMEDULLARY (IM) NAIL FEMORAL;  Surgeon: [name], MD;  Location: [facility];  Service: Orthopedics;  Laterality: Left;  . OVARIAN CYST SURGERY    . POSTERIOR LUMBAR FUSION 4 LEVEL N/A 03/17/2017   Procedure: Decompression of Lumbar One-Two with Thoracic Ten to Lumbar Two Fusion;  Surgeon: Kristeen Miss, MD;  Location: [facility];  Service: Neurosurgery;  Laterality: N/A;  Decompression of L1-2 with T10 to L2 Fusion   Social History   Social History Narrative   Patient drinks caffeine occasionally.   Patient is right handed.   Admitted to [facility]/21/17   Divorced    Former smoker - stopped 2000    Alcohol - occasionally wine at dinner   Full code   Immunization History  Administered Date(s) Administered  . Influenza, High Dose Seasonal PF 01/26/2018  . Influenza,inj,Quad PF,6+ Mos 02/18/2013, 02/09/2014  . Influenza-Unspecified 01/16/2015  . Pneumococcal Conjugate-13 08/29/2014  . Pneumococcal Polysaccharide-23 04/23/2005     Objective: Vital Signs: LMP  (LMP Unknown)    Physical Exam   Musculoskeletal Exam: ***  CDAI Exam: CDAI Score: - Patient Global:  -; Provider Global: - Swollen: -; Tender: - Joint Exam   No joint exam has been documented for this visit   There is currently no information documented on the homunculus. Go to the Rheumatology activity and complete the homunculus joint exam.  Investigation: No additional findings.  Imaging: Xr Femur Min 2 Views Left  Result Date: 10/22/2018 An AP and lateral left femur show residual healing of a midshaft femur fracture.  The patient has dynamize the fracture with breaking through the proximal of the 2 distal interlocking screws.  There is been interval healing since the last x-rays were obtained 4 weeks ago.   Recent Labs: Lab Results  Component Value Date   WBC 10.0 06/22/2018   HGB 8.9 (A) 06/22/2018   PLT 320 06/22/2018   NA 140 10/14/2018   K 3.8 10/14/2018   CL 104 10/14/2018   CO2 30 10/14/2018   GLUCOSE 95 10/14/2018   BUN 14 10/14/2018   CREATININE 0.83 10/14/2018   BILITOT 0.5 10/14/2018  ALKPHOS 116 10/14/2018   AST 14 10/14/2018   ALT 10 10/14/2018   PROT 6.9 10/14/2018   ALBUMIN 4.1 10/14/2018   CALCIUM 9.3 10/14/2018   GFRAA 51 (L) 06/15/2018    Speciality Comments: No specialty comments available.  Procedures:  No procedures performed Allergies: Cymbalta [duloxetine hcl], Baclofen, Betadine [povidone iodine], Adhesive [tape], and Lipitor [atorvastatin]   Assessment / Plan:     Visit Diagnoses: No diagnosis found.    History of diabetes mellitus  History of neuropathy  History of hypertension  History of sleep apnea  History of hypothyroidism  Dyslipidemia    Orders: No orders of the defined types were placed in this encounter.  No orders of the defined types were placed in this encounter.   Face-to-face time spent with patient was *** minutes. Greater than 50% of time was spent in counseling and coordination of care.  Follow-Up Instructions: No follow-ups on file.   Earnestine Mealing, CMA  Note - This record has been  created using Editor, commissioning.  Chart creation errors have been sought, but may not always  have been located. Such creation errors do not reflect on  the standard of medical care.

## 2018-10-27 DIAGNOSIS — I10 Essential (primary) hypertension: Secondary | ICD-10-CM | POA: Diagnosis not present

## 2018-10-27 DIAGNOSIS — K9289 Other specified diseases of the digestive system: Secondary | ICD-10-CM | POA: Diagnosis not present

## 2018-10-27 DIAGNOSIS — R609 Edema, unspecified: Secondary | ICD-10-CM | POA: Diagnosis not present

## 2018-10-28 ENCOUNTER — Other Ambulatory Visit: Payer: Self-pay

## 2018-10-28 DIAGNOSIS — Z7982 Long term (current) use of aspirin: Secondary | ICD-10-CM | POA: Diagnosis not present

## 2018-10-28 DIAGNOSIS — W19XXXD Unspecified fall, subsequent encounter: Secondary | ICD-10-CM | POA: Diagnosis not present

## 2018-10-28 DIAGNOSIS — E1140 Type 2 diabetes mellitus with diabetic neuropathy, unspecified: Secondary | ICD-10-CM | POA: Diagnosis not present

## 2018-10-28 DIAGNOSIS — I10 Essential (primary) hypertension: Secondary | ICD-10-CM | POA: Diagnosis not present

## 2018-10-28 DIAGNOSIS — S72002D Fracture of unspecified part of neck of left femur, subsequent encounter for closed fracture with routine healing: Secondary | ICD-10-CM | POA: Diagnosis not present

## 2018-10-28 DIAGNOSIS — Z794 Long term (current) use of insulin: Secondary | ICD-10-CM | POA: Diagnosis not present

## 2018-10-28 MED ORDER — GLUCOSE BLOOD VI STRP: ORAL_STRIP | 3 refills | Status: DC

## 2018-11-01 ENCOUNTER — Other Ambulatory Visit: Payer: Self-pay | Admitting: Internal Medicine

## 2018-11-02 ENCOUNTER — Other Ambulatory Visit: Payer: Self-pay

## 2018-11-02 ENCOUNTER — Encounter: Payer: Self-pay | Admitting: Orthopaedic Surgery

## 2018-11-02 ENCOUNTER — Ambulatory Visit: Payer: Medicare Other | Attending: Orthopaedic Surgery | Admitting: Physical Therapy

## 2018-11-02 ENCOUNTER — Other Ambulatory Visit: Payer: Self-pay | Admitting: Endocrinology

## 2018-11-02 ENCOUNTER — Encounter: Payer: Self-pay | Admitting: Physical Therapy

## 2018-11-02 DIAGNOSIS — M25652 Stiffness of left hip, not elsewhere classified: Secondary | ICD-10-CM | POA: Insufficient documentation

## 2018-11-02 DIAGNOSIS — R262 Difficulty in walking, not elsewhere classified: Secondary | ICD-10-CM | POA: Diagnosis not present

## 2018-11-02 DIAGNOSIS — M25552 Pain in left hip: Secondary | ICD-10-CM | POA: Insufficient documentation

## 2018-11-02 DIAGNOSIS — M6281 Muscle weakness (generalized): Secondary | ICD-10-CM | POA: Insufficient documentation

## 2018-11-02 NOTE — Therapy (Signed)
[place], [place], [address] Phone: [phone]   Fax:  [phone]  Physical Therapy Evaluation  Patient Details  Name: [name] MRN: [id number] Date of Birth: 1940-05-03 Referring Provider (PT): Ninfa Linden   Encounter Date: 11/02/2018  PT End of Session - 11/02/18 1428    Visit Number  1    Date for PT Re-Evaluation  01/03/19    PT Start Time  3664    PT Stop Time  1449    PT Time Calculation (min)  44 min    Activity Tolerance  Patient tolerated treatment well    Behavior During Therapy  [facility] for tasks assessed/performed       Past Medical History:  Diagnosis Date  . Acute blood loss anemia   . Acute encephalopathy 07/15/2016  . AKI (acute kidney injury) ([place])   . Anemia    has sickle cell trait  . Anxiety   . Arthritis   . Asthma    has used inhaler in past for asthmatic bronchitis, last time- early 2012  . Bilateral primary osteoarthritis of knee 03/26/2016  . Complication of anesthesia    wakes up shaking  . Diabetes mellitus   . Diabetes mellitus with neuropathy ([place]) 10/29/2012  . Diverticulitis   . Dyslipidemia   . Encephalopathy 06/2016   due to medications after surgery  . Fibromyalgia   . GERD (gastroesophageal reflux disease)    occas. use of  Prilosec  . Heart murmur    sees Dr. Montez Morita, last seen- early 2012  . Herniated nucleus pulposus, L2-3 06/25/2016  . History of back surgery   . Hypertension    02/2010- stress test /w PCP  . Hypothyroidism   . Loose bowel movements 12/2016  . Lumbar radiculopathy 06/27/2016  . Lumbar stenosis with neurogenic claudication 03/17/2017  . Memory disorder 02/16/2016  . Myoclonic jerking   . Neuromuscular disorder (HCC)    lumbar radiculopathy, lumbago  . Nocturnal leg cramps 09/27/2014  . Osteoporosis 03/10/2014  . Pneumonia   . Sickle cell trait ([place])   . Sleep apnea    borderline sleep apnea, states she no longer uses,  early 2012- stopped using   . Spinal stenosis of lumbar region with radiculopathy 02/28/2014  . Spondylolisthesis of lumbar region 03/19/2011  . Type II or unspecified type diabetes mellitus without mention of complication, uncontrolled 07/08/2013    Past Surgical History:  Procedure Laterality Date  . ABDOMINAL HYSTERECTOMY    . adb.cyst     ovarian cyst  . BACK SURGERY     2012, 2015 (3 total)  . BACK SURGERY  2018   02/24/2017  . COLONOSCOPY    . EYE SURGERY     macular degeneration treatment - injections  . FEMUR IM NAIL Left 06/13/2018   Procedure: INTRAMEDULLARY (IM) NAIL FEMORAL;  Surgeon: [name], MD;  Location: [facility];  Service: Orthopedics;  Laterality: Left;  . OVARIAN CYST SURGERY    . POSTERIOR LUMBAR FUSION 4 LEVEL N/A 03/17/2017   Procedure: Decompression of Lumbar One-Two with Thoracic Ten to Lumbar Two Fusion;  Surgeon: Kristeen Miss, MD;  Location: [facility];  Service: Neurosurgery;  Laterality: N/A;  Decompression of L1-2 with T10 to L2 Fusion    There were no vitals filed for this visit.   Subjective Assessment - 11/02/18 1407    Subjective  Patient reports that she had a fracture of the left femur on 06/12/18,  she reports that the bone broke and then she fell.  She underwent IM nailing on 4/22/2o.  She was in a SNF for 3 weeks.  Then had PT from March 16th until last week.  She was using a walker until 3 weeks ago.    Pertinent History  has had 5 lumbar surgeries, osteoporosis, right knee DJD    Limitations  Lifting;Standing;Walking;House hold activities    Patient Stated Goals  walk better, walk without device    Currently in Pain?  Yes    Pain Score  8     Pain Location  Hip    Pain Orientation  Left    Pain Descriptors / Indicators  Aching    Pain Type  Acute pain;Surgical pain    Pain Radiating Towards  into the left thigh    Pain Onset  More than a month ago    Pain Frequency  Intermittent    Aggravating Factors   worse in the AM, standign  and walking pain up to 8-9/10    Pain Relieving Factors  at rest she can have no pain    Effect of Pain on Daily Activities  reports that she really hurts when she walks, reports that she has to use an electric cart to do any shopping         [facility] PT Assessment - 11/02/18 0001      Assessment   Medical Diagnosis  s/p left IM nail femur    Referring Provider (PT)  Ninfa Linden    Onset Date/Surgical Date  06/13/18    Prior Therapy  for back pain      Precautions   Precautions  None      Balance Screen   Has the patient fallen in the past 6 months  Yes    How many times?  1    Has the patient had a decrease in activity level because of a fear of falling?   Yes    Is the patient reluctant to leave their home because of a fear of falling?   No      Home Environment   Additional Comments  was doing housework, does her own shopping, has stairs at home      Prior Function   Level of Independence  Independent    Vocation  Retired    Leisure  does some exercise      ROM / Strength   AROM / PROM / Strength  AROM;Strength      AROM   AROM Assessment Site  Hip    Right/Left Hip  Left    Left Hip Flexion  45    Left Hip External Rotation   12    Left Hip ABduction  10      Strength   Overall Strength Comments  3+/5 difficulty with holding against any resistance, she had pain with MMT      Flexibility   Soft Tissue Assessment /Muscle Length  yes    Hamstrings  tight    Quadriceps  tight    Piriformis  tight      Palpation   Palpation comment  she is very tender over the scar and appears to have a keloid, she is tender in the left GT area      Ambulation/Gait   Gait Comments  uses a SPC, slow, antalgic on the left, trunk forward and leans to the right, without the cane she has really has a significant limp and minimal stance phase  on the left      Standardized Balance Assessment   Standardized Balance Assessment  Timed Up and Go Test      Timed Up and Go Test   Normal TUG  (seconds)  34                Objective measurements completed on examination: See above findings.      [facility] Adult PT Treatment/Exercise - 11/02/18 0001      Exercises   Exercises  Knee/Hip      Knee/Hip Exercises: Aerobic   Recumbent Bike  x 5 minutes    Nustep  level 4 x 6 minutes               PT Short Term Goals - 11/02/18 1435      PT SHORT TERM GOAL #1   Title  independent with initial HEP    Time  2    Period  Weeks    Status  New        PT Long Term Goals - 11/02/18 1435      PT LONG TERM GOAL #1   Title  decrease pain 50%    Time  8    Period  Weeks    Status  New      PT LONG TERM GOAL #2   Title  get up from sitting without pain and without hands    Time  8    Period  Weeks    Status  New      PT LONG TERM GOAL #3   Title  increase left hip flexion to 90 degrees    Time  8    Period  Weeks    Status  New      PT LONG TERM GOAL #4   Title  walk without assistive device 300 feet    Time  8    Period  Weeks    Status  New             Plan - 11/02/18 1428    Clinical Impression Statement  Patient reports a fall 06/12/18, she underwent a left femur IM nailing on 06/13/18.  She reports that she was in a SNF for 3 weeks and then has been having home PT until last week.  She reports that she has really had a difficult time walking, reports that she was using a FWW until the last few weeks.  She has limited ROM and poor strength on the left.    Personal Factors and Comorbidities  Comorbidity 3+    Comorbidities  DJD right knee, 5 lumbar surgeries, osteoporosis    Stability/Clinical Decision Making  Evolving/Moderate complexity    Clinical Decision Making  Moderate    Rehab Potential  Good    PT Frequency  3x / week    PT Duration  8 weeks    PT Treatment/Interventions  ADLs/Self Care Home Management;Electrical Stimulation;Iontophoresis 4mg /ml Dexamethasone;Moist Heat;Ultrasound;Functional mobility training;Stair training;Gait  training;Therapeutic activities;Therapeutic exercise;Balance training;Neuromuscular re-education;Manual techniques;Patient/family education    PT Next Visit Plan  slowly add exercises  to help with function and walking    Consulted and Agree with Plan of Care  Patient       Patient will benefit from skilled therapeutic intervention in order to improve the following deficits and impairments:  Abnormal gait, Pain, Postural dysfunction, Increased muscle spasms, Decreased scar mobility, Decreased mobility, Cardiopulmonary status limiting activity, Decreased activity tolerance, Decreased endurance, Decreased range of motion, Decreased strength, Decreased  balance, Difficulty walking, Impaired flexibility  Visit Diagnosis: 1. Pain in left hip   2. Stiffness of left hip, not elsewhere classified   3. Difficulty in walking, not elsewhere classified        Problem List Patient Active Problem List   Diagnosis Date Noted  . Fracture, proximal femur ([place]) 06/12/2018  . Femur fracture, left ([place]) 06/12/2018  . Acute blood loss as cause of postoperative anemia 03/30/2017  . Diabetic polyneuropathy associated with secondary diabetes mellitus ([place]) 03/30/2017  . Asthma 03/30/2017  . Dyslipidemia 03/26/2017  . Lumbar stenosis with neurogenic claudication 03/17/2017  . Spinal stenosis of lumbar region 03/03/2017  . Myoclonic jerking   . Nausea   . Acute encephalopathy 07/15/2016  . Lumbar radiculopathy 06/27/2016  . Hypothyroidism 06/26/2016  . Labile blood glucose   . Surgery, elective   . Post-operative pain   . Sickle cell trait ([place])   . Acute blood loss anemia   . History of back surgery   . AKI (acute kidney injury) ([place])   . Herniated nucleus pulposus, L2-3 06/25/2016  . Bilateral primary osteoarthritis of knee 03/26/2016  . Osteoarthritis, hand 03/26/2016  . Other insomnia 03/26/2016  . Memory disorder 02/16/2016  . Fibromyalgia 09/27/2014  . Nocturnal leg cramps 09/27/2014  .  Body mass index (BMI) of 30.0-30.9 in adult 08/04/2014  . Neuropathy 03/10/2014  . Allergic rhinitis 03/10/2014  . Osteoporosis 03/10/2014  . Spinal stenosis of lumbar region with radiculopathy 02/28/2014  . Type 2 diabetes mellitus with complication, with long-term current use of insulin ([place]) 07/08/2013  . Hypokalemia 11/02/2012  . Essential hypertension, benign 11/02/2012  . Diabetes mellitus with neuropathy ([place]) 10/29/2012  . Hyperlipidemia 10/29/2012  . Spondylolisthesis of lumbar region 03/19/2011  . Lumbar radicular pain 03/19/2011    Sumner Boast., PT 11/02/2018, 2:40 PM  [place], [place], [address] Phone: [phone]   Fax:  [phone]  Name: SIAH KANNAN MRN: [id number] Date of Birth: [birth year]

## 2018-11-04 NOTE — Progress Notes (Signed)
Office Visit Note  Patient: [name]             Date of Birth: Mar 30, 1941           MRN: [id number]             PCP: Wenda Low, MD Referring: Wenda Low, MD Visit Date: 11/06/2018 Occupation: '@GUAROCC' @  Subjective:  Pain in joints and muscles.   History of Present Illness: [name] is a 78 y.o. female history of osteoarthritis, degenerative disc disease and fibromyalgia syndrome.  According to patient she had atypical left femur fracture in February 2020 which was repaired by Dr. Ninfa Linden.  She was on Fosamax which was discontinued.  She also continues to have pain and discomfort in her right knee joint and had an x-ray done by Dr. Ninfa Linden which showed severe osteoarthritis.  She is interested in getting Visco supplement injections.  She has been also having some abdominal discomfort and she is concerned that she may have diverticulitis.  She had colonoscopy several years ago by Dr. Collene Mares and she would like to see her again.  She is currently not having much discomfort in her lower back.  Activities of Daily Living:  Patient reports morning stiffness for 3 hours.   Patient Reports nocturnal pain.  Difficulty dressing/grooming: Denies Difficulty climbing stairs: Denies Difficulty getting out of chair: Denies Difficulty using hands for taps, buttons, cutlery, and/or writing: Denies  Review of Systems  Constitutional: Positive for fatigue. Negative for night sweats, weight gain and weight loss.  HENT: Positive for mouth dryness. Negative for mouth sores, trouble swallowing, trouble swallowing and nose dryness.   Eyes: Positive for dryness. Negative for pain, redness and visual disturbance.  Respiratory: Negative for cough, shortness of breath and difficulty breathing.   Cardiovascular: Negative for chest pain, palpitations, hypertension, irregular heartbeat and swelling in legs/feet.  Gastrointestinal: Positive for constipation and diarrhea. Negative for blood  in stool.  Endocrine: Negative for cold intolerance and increased urination.  Genitourinary: Negative for difficulty urinating and vaginal dryness.  Musculoskeletal: Positive for arthralgias, gait problem, joint pain, muscle weakness and morning stiffness. Negative for joint swelling, myalgias, muscle tenderness and myalgias.  Skin: Negative for color change, rash, hair loss, skin tightness, ulcers and sensitivity to sunlight.  Allergic/Immunologic: Negative for susceptible to infections.  Neurological: Negative for dizziness, numbness, memory loss, night sweats and weakness.  Hematological: Negative for bruising/bleeding tendency and swollen glands.  Psychiatric/Behavioral: Positive for sleep disturbance. Negative for depressed mood. The patient is not nervous/anxious.     PMFS History:  Patient Active Problem List   Diagnosis Date Noted  . Fracture, proximal femur ([place]) 06/12/2018  . Femur fracture, left ([place]) 06/12/2018  . Acute blood loss as cause of postoperative anemia 03/30/2017  . Diabetic polyneuropathy associated with secondary diabetes mellitus ([place]) 03/30/2017  . Asthma 03/30/2017  . Dyslipidemia 03/26/2017  . Lumbar stenosis with neurogenic claudication 03/17/2017  . Spinal stenosis of lumbar region 03/03/2017  . Myoclonic jerking   . Nausea   . Acute encephalopathy 07/15/2016  . Lumbar radiculopathy 06/27/2016  . Hypothyroidism 06/26/2016  . Labile blood glucose   . Surgery, elective   . Post-operative pain   . Sickle cell trait ([place])   . Acute blood loss anemia   . History of back surgery   . AKI (acute kidney injury) ([place])   . Herniated nucleus pulposus, L2-3 06/25/2016  . Bilateral primary osteoarthritis of knee 03/26/2016  . Osteoarthritis, hand 03/26/2016  . Other  insomnia 03/26/2016  . Memory disorder 02/16/2016  . Fibromyalgia 09/27/2014  . Nocturnal leg cramps 09/27/2014  . Body mass index (BMI) of 30.0-30.9 in adult 08/04/2014  . Neuropathy 03/10/2014   . Allergic rhinitis 03/10/2014  . Osteoporosis 03/10/2014  . Spinal stenosis of lumbar region with radiculopathy 02/28/2014  . Type 2 diabetes mellitus with complication, with long-term current use of insulin ([place]) 07/08/2013  . Hypokalemia 11/02/2012  . Essential hypertension, benign 11/02/2012  . Diabetes mellitus with neuropathy ([place]) 10/29/2012  . Hyperlipidemia 10/29/2012  . Spondylolisthesis of lumbar region 03/19/2011  . Lumbar radicular pain 03/19/2011    Past Medical History:  Diagnosis Date  . Acute blood loss anemia   . Acute encephalopathy 07/15/2016  . AKI (acute kidney injury) ([place])   . Anemia    has sickle cell trait  . Anxiety   . Arthritis   . Asthma    has used inhaler in past for asthmatic bronchitis, last time- early 2012  . Bilateral primary osteoarthritis of knee 03/26/2016  . Complication of anesthesia    wakes up shaking  . Diabetes mellitus   . Diabetes mellitus with neuropathy ([place]) 10/29/2012  . Diverticulitis   . Dyslipidemia   . Encephalopathy 06/2016   due to medications after surgery  . Fibromyalgia   . GERD (gastroesophageal reflux disease)    occas. use of  Prilosec  . Heart murmur    sees Dr. Montez Morita, last seen- early 2012  . Herniated nucleus pulposus, L2-3 06/25/2016  . History of back surgery   . Hypertension    02/2010- stress test /w PCP  . Hypothyroidism   . Loose bowel movements 12/2016  . Lumbar radiculopathy 06/27/2016  . Lumbar stenosis with neurogenic claudication 03/17/2017  . Memory disorder 02/16/2016  . Myoclonic jerking   . Neuromuscular disorder (HCC)    lumbar radiculopathy, lumbago  . Nocturnal leg cramps 09/27/2014  . Osteoporosis 03/10/2014  . Pneumonia   . Sickle cell trait ([place])   . Sleep apnea    borderline sleep apnea, states she no longer uses, early 2012- stopped using   . Spinal stenosis of lumbar region with radiculopathy 02/28/2014  . Spondylolisthesis of lumbar region 03/19/2011  . Type II or unspecified  type diabetes mellitus without mention of complication, uncontrolled 07/08/2013    Family History  Problem Relation Age of Onset  . Ovarian cancer Mother   . Cancer - Prostate Father   . Breast cancer Paternal Aunt   . Multiple myeloma Paternal Aunt   . Anesthesia problems Neg Hx   . Hypotension Neg Hx   . Malignant hyperthermia Neg Hx   . Pseudochol deficiency Neg Hx    Past Surgical History:  Procedure Laterality Date  . ABDOMINAL HYSTERECTOMY    . adb.cyst     ovarian cyst  . BACK SURGERY     2012, 2015 (3 total)  . BACK SURGERY  2018   02/24/2017  . COLONOSCOPY    . EYE SURGERY     macular degeneration treatment - injections  . FEMUR IM NAIL Left 06/13/2018   Procedure: INTRAMEDULLARY (IM) NAIL FEMORAL;  Surgeon: [name], MD;  Location: [facility];  Service: Orthopedics;  Laterality: Left;  . OVARIAN CYST SURGERY    . POSTERIOR LUMBAR FUSION 4 LEVEL N/A 03/17/2017   Procedure: Decompression of Lumbar One-Two with Thoracic Ten to Lumbar Two Fusion;  Surgeon: Kristeen Miss, MD;  Location: [facility];  Service: Neurosurgery;  Laterality: N/A;  Decompression of  L1-2 with T10 to L2 Fusion   Social History   Social History Narrative   Patient drinks caffeine occasionally.   Patient is right handed.   Admitted [facility]/21/17   Divorced    Former smoker - stopped 2000    Alcohol - occasionally wine at dinner   Full code   Immunization History  Administered Date(s) Administered  . Influenza, High Dose Seasonal PF 01/26/2018  . Influenza,inj,Quad PF,6+ Mos 02/18/2013, 02/09/2014  . Influenza-Unspecified 01/16/2015  . Pneumococcal Conjugate-13 08/29/2014  . Pneumococcal Polysaccharide-23 04/23/2005     Objective: Vital Signs: BP (!) 148/82 (BP Location: Left Arm, Patient Position: Sitting, Cuff Size: Normal)   Pulse 78   Resp 20   Ht '5\' 5"'  (1.651 m)   Wt 178 lb 9.6 oz (81 kg)   LMP  (LMP Unknown)   BMI 29.72 kg/m    Physical Exam  Vitals signs and nursing note reviewed.  Constitutional:      Appearance: She is well-developed.  HENT:     Head: Normocephalic and atraumatic.  Eyes:     Conjunctiva/sclera: Conjunctivae normal.  Neck:     Musculoskeletal: Normal range of motion.  Cardiovascular:     Rate and Rhythm: Normal rate and regular rhythm.     Heart sounds: Normal heart sounds.  Pulmonary:     Effort: Pulmonary effort is normal.     Breath sounds: Normal breath sounds.  Abdominal:     General: Bowel sounds are normal.     Palpations: Abdomen is soft.  Lymphadenopathy:     Cervical: No cervical adenopathy.  Skin:    General: Skin is warm and dry.     Capillary Refill: Capillary refill takes less than 2 seconds.  Neurological:     Mental Status: She is alert and oriented to person, place, and time.  Psychiatric:        Behavior: Behavior normal.      Musculoskeletal Exam: C-spine was in good range of motion.  Lumbar spine was difficult to assess as she was having a lot of discomfort with mobility.  Shoulder joints, elbow joints, wrist joints with good range of motion.  She has DIP and PIP thickening of bilateral hands consistent with osteoarthritis.  She has discomfort range of motion of her left hip joint.  She had recent surgery for left femur fracture.  She has discomfort range of motion of her right knee joint without any warmth swelling or effusion.  CDAI Exam: CDAI Score: - Patient Global: -; Provider Global: - Swollen: -; Tender: - Joint Exam   No joint exam has been documented for this visit   There is currently no information documented on the homunculus. Go to the Rheumatology activity and complete the homunculus joint exam.  Investigation: No additional findings.  Imaging: Xr Femur Min 2 Views Left  Result Date: 10/22/2018 An AP and lateral left femur show residual healing of a midshaft femur fracture.  The patient has dynamize the fracture with breaking through the proximal of the 2  distal interlocking screws.  There is been interval healing since the last x-rays were obtained 4 weeks ago.   Recent Labs: Lab Results  Component Value Date   WBC 10.0 06/22/2018   HGB 8.9 (A) 06/22/2018   PLT 320 06/22/2018   NA 140 10/14/2018   K 3.8 10/14/2018   CL 104 10/14/2018   CO2 30 10/14/2018   GLUCOSE 95 10/14/2018   BUN 14 10/14/2018  CREATININE 0.83 10/14/2018   BILITOT 0.5 10/14/2018   ALKPHOS 116 10/14/2018   AST 14 10/14/2018   ALT 10 10/14/2018   PROT 6.9 10/14/2018   ALBUMIN 4.1 10/14/2018   CALCIUM 9.3 10/14/2018   GFRAA 51 (L) 06/15/2018    Speciality Comments: No specialty comments available.  Procedures:  No procedures performed Allergies: Cymbalta [duloxetine hcl], Baclofen, Betadine [povidone iodine], Adhesive [tape], and Lipitor [atorvastatin]   Assessment / Plan:     Visit Diagnoses: Fibromyalgia -patient continues to have some generalized pain and discomfort.  Primary osteoarthritis of both hands -joint protection muscle strengthening was discussed.  Primary osteoarthritis of right knee -she has severe osteoarthritis in her right knee joint.  She has had cortisone injections in the past.  She is not ready to have right total knee replacement at this point as she is recovering from her left femur fracture surgery.  She wants to apply for Visco supplement injections.  DDD (degenerative disc disease), lumbar -she has chronic discomfort and limited range of motion.  She has been taking tramadol for pain management.  Other insomnia -good sleep hygiene was discussed.  Age-related osteoporosis without current pathological fracture -patient had been on Fosamax in the past until she had femur fracture.  Abdominal discomfort-patient has been having some discomfort in her abdomen.  She had colonoscopy by Dr. Collene Mares several years ago.  I have advised her to schedule an appointment with her.  History of diabetes mellitus   History of neuropathy    History of hypertension -blood pressure is elevated.  Have advised to monitor blood pressure closely.  History of sleep apnea   History of hypothyroidism   Dyslipidemia   Orders: No orders of the defined types were placed in this encounter.  No orders of the defined types were placed in this encounter.     Follow-Up Instructions: Return in about 6 months (around 05/09/2019) for Osteoarthritis, FMS.   Bo Merino, MD  Note - This record has been created using Editor, commissioning.  Chart creation errors have been sought, but may not always  have been located. Such creation errors do not reflect on  the standard of medical care.

## 2018-11-05 ENCOUNTER — Ambulatory Visit: Payer: Medicare Other | Admitting: Physical Therapy

## 2018-11-05 ENCOUNTER — Other Ambulatory Visit: Payer: Self-pay

## 2018-11-05 ENCOUNTER — Encounter: Payer: Self-pay | Admitting: Physical Therapy

## 2018-11-05 DIAGNOSIS — M6281 Muscle weakness (generalized): Secondary | ICD-10-CM | POA: Diagnosis not present

## 2018-11-05 DIAGNOSIS — R262 Difficulty in walking, not elsewhere classified: Secondary | ICD-10-CM | POA: Diagnosis not present

## 2018-11-05 DIAGNOSIS — M25552 Pain in left hip: Secondary | ICD-10-CM | POA: Diagnosis not present

## 2018-11-05 DIAGNOSIS — M25652 Stiffness of left hip, not elsewhere classified: Secondary | ICD-10-CM

## 2018-11-05 NOTE — Therapy (Signed)
[place], [place], [address] Phone: [phone]   Fax:  [phone]  Physical Therapy Treatment  Patient Details  Name: [name] MRN: [id number] Date of Birth: [birth year] Referring Provider (PT): Ninfa Linden   Encounter Date: 11/05/2018  PT End of Session - 11/05/18 1343    Visit Number  2    Date for PT Re-Evaluation  01/03/19    PT Start Time  1300    PT Stop Time  1345    PT Time Calculation (min)  45 min    Activity Tolerance  Patient tolerated treatment well    Behavior During Therapy  [facility] for tasks assessed/performed       Past Medical History:  Diagnosis Date  . Acute blood loss anemia   . Acute encephalopathy 07/15/2016  . AKI (acute kidney injury) ([place])   . Anemia    has sickle cell trait  . Anxiety   . Arthritis   . Asthma    has used inhaler in past for asthmatic bronchitis, last time- early 2012  . Bilateral primary osteoarthritis of knee 03/26/2016  . Complication of anesthesia    wakes up shaking  . Diabetes mellitus   . Diabetes mellitus with neuropathy ([place]) 10/29/2012  . Diverticulitis   . Dyslipidemia   . Encephalopathy 06/2016   due to medications after surgery  . Fibromyalgia   . GERD (gastroesophageal reflux disease)    occas. use of  Prilosec  . Heart murmur    sees Dr. Montez Morita, last seen- early 2012  . Herniated nucleus pulposus, L2-3 06/25/2016  . History of back surgery   . Hypertension    02/2010- stress test /w PCP  . Hypothyroidism   . Loose bowel movements 12/2016  . Lumbar radiculopathy 06/27/2016  . Lumbar stenosis with neurogenic claudication 03/17/2017  . Memory disorder 02/16/2016  . Myoclonic jerking   . Neuromuscular disorder (HCC)    lumbar radiculopathy, lumbago  . Nocturnal leg cramps 09/27/2014  . Osteoporosis 03/10/2014  . Pneumonia   . Sickle cell trait ([place])   . Sleep apnea    borderline sleep apnea, states she no longer uses,  early 2012- stopped using   . Spinal stenosis of lumbar region with radiculopathy 02/28/2014  . Spondylolisthesis of lumbar region 03/19/2011  . Type II or unspecified type diabetes mellitus without mention of complication, uncontrolled 07/08/2013    Past Surgical History:  Procedure Laterality Date  . ABDOMINAL HYSTERECTOMY    . adb.cyst     ovarian cyst  . BACK SURGERY     2012, 2015 (3 total)  . BACK SURGERY  2018   02/24/2017  . COLONOSCOPY    . EYE SURGERY     macular degeneration treatment - injections  . FEMUR IM NAIL Left 06/13/2018   Procedure: INTRAMEDULLARY (IM) NAIL FEMORAL;  Surgeon: [name], MD;  [facility];  Service: Orthopedics;  Laterality: Left;  . OVARIAN CYST SURGERY    . POSTERIOR LUMBAR FUSION 4 LEVEL N/A 03/17/2017   Procedure: Decompression of Lumbar One-Two with Thoracic Ten to Lumbar Two Fusion;  Surgeon: Kristeen Miss, MD;  Location: [facility];  Service: Neurosurgery;  Laterality: N/A;  Decompression of L1-2 with T10 to L2 Fusion    There were no vitals filed for this visit.  Subjective Assessment - 11/05/18 1304    Subjective  Pt reports that's her knee has been hurting    Pertinent History  has had 5 lumbar surgeries, osteoporosis, right knee DJD    Limitations  Lifting;Standing;Walking;House hold activities    Patient Stated Goals  walk better, walk without device    Currently in Pain?  Yes    Pain Score  6     Pain Location  Leg    Pain Orientation  Left                       [facility] Adult PT Treatment/Exercise - 11/05/18 0001      Knee/Hip Exercises: Aerobic   Recumbent Bike  x 4 minutes    Nustep  level 4 x 6 minutes      Knee/Hip Exercises: Machines for Strengthening   Cybex Knee Extension  5lb 2x10     Cybex Knee Flexion  15lb 2x15       Knee/Hip Exercises: Seated   Long Arc Quad  Left;2 sets;10 reps    Long Arc Quad Weight  2 lbs.    Ball Squeeze  2x10    Abduction/Adduction   Strengthening;2 sets;15  reps    Abd/Adduction Limitations  green tband    Sit to Sand  5 reps;3 sets;with UE support   UE on knees               PT Short Term Goals - 11/02/18 1435      PT SHORT TERM GOAL #1   Title  independent with initial HEP    Time  2    Period  Weeks    Status  New        PT Long Term Goals - 11/02/18 1435      PT LONG TERM GOAL #1   Title  decrease pain 50%    Time  8    Period  Weeks    Status  New      PT LONG TERM GOAL #2   Title  get up from sitting without pain and without hands    Time  8    Period  Weeks    Status  New      PT LONG TERM GOAL #3   Title  increase left hip flexion to 90 degrees    Time  8    Period  Weeks    Status  New      PT LONG TERM GOAL #4   Title  walk without assistive device 300 feet    Time  8    Period  Weeks    Status  New            Plan - 11/05/18 1344    Clinical Impression Statement  Pt tolerated an initial progression to TE well. She doe fatigue with activity. Some weakness and decrease L hip control noted with Tband HS cues. Reports some tolerable L leg pain with LAQ. Cues not to lean back and to engage core with seated marches.    Personal Factors and Comorbidities  Comorbidity 3+    Comorbidities  DJD right knee, 5 lumbar surgeries, osteoporosis    Stability/Clinical Decision Making  Evolving/Moderate complexity    Rehab Potential  Good    PT Frequency  3x / week    PT Duration  8 weeks    PT Treatment/Interventions  ADLs/Self Care Home Management;Electrical Stimulation;Iontophoresis 4mg /ml Dexamethasone;Moist Heat;Ultrasound;Functional mobility training;Stair training;Gait training;Therapeutic activities;Therapeutic exercise;Balance training;Neuromuscular re-education;Manual techniques;Patient/family education    PT Next Visit Plan  slowly add exercises  to help with function and walking  Patient will benefit from skilled therapeutic intervention in order to improve the following deficits and  impairments:  Abnormal gait, Pain, Postural dysfunction, Increased muscle spasms, Decreased scar mobility, Decreased mobility, Cardiopulmonary status limiting activity, Decreased activity tolerance, Decreased endurance, Decreased range of motion, Decreased strength, Decreased balance, Difficulty walking, Impaired flexibility  Visit Diagnosis: 1. Stiffness of left hip, not elsewhere classified   2. Pain in left hip   3. Difficulty in walking, not elsewhere classified        Problem List Patient Active Problem List   Diagnosis Date Noted  . Fracture, proximal femur ([place]) 06/12/2018  . Femur fracture, left ([place]) 06/12/2018  . Acute blood loss as cause of postoperative anemia 03/30/2017  . Diabetic polyneuropathy associated with secondary diabetes mellitus ([place]) 03/30/2017  . Asthma 03/30/2017  . Dyslipidemia 03/26/2017  . Lumbar stenosis with neurogenic claudication 03/17/2017  . Spinal stenosis of lumbar region 03/03/2017  . Myoclonic jerking   . Nausea   . Acute encephalopathy 07/15/2016  . Lumbar radiculopathy 06/27/2016  . Hypothyroidism 06/26/2016  . Labile blood glucose   . Surgery, elective   . Post-operative pain   . Sickle cell trait ([place])   . Acute blood loss anemia   . History of back surgery   . AKI (acute kidney injury) ([place])   . Herniated nucleus pulposus, L2-3 06/25/2016  . Bilateral primary osteoarthritis of knee 03/26/2016  . Osteoarthritis, hand 03/26/2016  . Other insomnia 03/26/2016  . Memory disorder 02/16/2016  . Fibromyalgia 09/27/2014  . Nocturnal leg cramps 09/27/2014  . Body mass index (BMI) of 30.0-30.9 in adult 08/04/2014  . Neuropathy 03/10/2014  . Allergic rhinitis 03/10/2014  . Osteoporosis 03/10/2014  . Spinal stenosis of lumbar region with radiculopathy 02/28/2014  . Type 2 diabetes mellitus with complication, with long-term current use of insulin ([place]) 07/08/2013  . Hypokalemia 11/02/2012  . Essential hypertension, benign 11/02/2012  .  Diabetes mellitus with neuropathy ([place]) 10/29/2012  . Hyperlipidemia 10/29/2012  . Spondylolisthesis of lumbar region 03/19/2011  . Lumbar radicular pain 03/19/2011    Scot Jun, PTA 11/05/2018, 1:48 PM  [place], [place], [address] Phone: [phone]   Fax:  [phone]  Name: [name] MRN: [id number] Date of Birth: Sep 04, 1940

## 2018-11-06 ENCOUNTER — Telehealth: Payer: Self-pay

## 2018-11-06 ENCOUNTER — Encounter: Payer: Self-pay | Admitting: Rheumatology

## 2018-11-06 ENCOUNTER — Ambulatory Visit (INDEPENDENT_AMBULATORY_CARE_PROVIDER_SITE_OTHER): Payer: Medicare Other | Admitting: Rheumatology

## 2018-11-06 ENCOUNTER — Other Ambulatory Visit: Payer: Self-pay

## 2018-11-06 ENCOUNTER — Ambulatory Visit: Payer: Self-pay | Admitting: Rheumatology

## 2018-11-06 VITALS — BP 148/82 | HR 78 | Resp 20 | Ht 65.0 in | Wt 178.6 lb

## 2018-11-06 DIAGNOSIS — M5136 Other intervertebral disc degeneration, lumbar region: Secondary | ICD-10-CM

## 2018-11-06 DIAGNOSIS — G4709 Other insomnia: Secondary | ICD-10-CM | POA: Diagnosis not present

## 2018-11-06 DIAGNOSIS — M810 Age-related osteoporosis without current pathological fracture: Secondary | ICD-10-CM

## 2018-11-06 DIAGNOSIS — M19041 Primary osteoarthritis, right hand: Secondary | ICD-10-CM | POA: Diagnosis not present

## 2018-11-06 DIAGNOSIS — Z8639 Personal history of other endocrine, nutritional and metabolic disease: Secondary | ICD-10-CM

## 2018-11-06 DIAGNOSIS — Z8679 Personal history of other diseases of the circulatory system: Secondary | ICD-10-CM | POA: Diagnosis not present

## 2018-11-06 DIAGNOSIS — M1711 Unilateral primary osteoarthritis, right knee: Secondary | ICD-10-CM

## 2018-11-06 DIAGNOSIS — R109 Unspecified abdominal pain: Secondary | ICD-10-CM

## 2018-11-06 DIAGNOSIS — M797 Fibromyalgia: Secondary | ICD-10-CM

## 2018-11-06 DIAGNOSIS — E785 Hyperlipidemia, unspecified: Secondary | ICD-10-CM | POA: Diagnosis not present

## 2018-11-06 DIAGNOSIS — M19042 Primary osteoarthritis, left hand: Secondary | ICD-10-CM

## 2018-11-06 DIAGNOSIS — Z8669 Personal history of other diseases of the nervous system and sense organs: Secondary | ICD-10-CM | POA: Diagnosis not present

## 2018-11-06 NOTE — Telephone Encounter (Signed)
Please apply for right knee visco, per Dr. Estanislado Pandy.

## 2018-11-09 NOTE — Telephone Encounter (Signed)
Noted  

## 2018-11-10 ENCOUNTER — Encounter: Payer: Self-pay | Admitting: Physical Therapy

## 2018-11-10 ENCOUNTER — Telehealth: Payer: Self-pay

## 2018-11-10 ENCOUNTER — Other Ambulatory Visit: Payer: Self-pay

## 2018-11-10 ENCOUNTER — Ambulatory Visit: Payer: Medicare Other | Admitting: Physical Therapy

## 2018-11-10 DIAGNOSIS — M25652 Stiffness of left hip, not elsewhere classified: Secondary | ICD-10-CM

## 2018-11-10 DIAGNOSIS — M6281 Muscle weakness (generalized): Secondary | ICD-10-CM | POA: Diagnosis not present

## 2018-11-10 DIAGNOSIS — R262 Difficulty in walking, not elsewhere classified: Secondary | ICD-10-CM | POA: Diagnosis not present

## 2018-11-10 DIAGNOSIS — Z86010 Personal history of colonic polyps: Secondary | ICD-10-CM | POA: Diagnosis not present

## 2018-11-10 DIAGNOSIS — R1032 Left lower quadrant pain: Secondary | ICD-10-CM | POA: Diagnosis not present

## 2018-11-10 DIAGNOSIS — M25552 Pain in left hip: Secondary | ICD-10-CM

## 2018-11-10 DIAGNOSIS — K219 Gastro-esophageal reflux disease without esophagitis: Secondary | ICD-10-CM | POA: Diagnosis not present

## 2018-11-10 NOTE — Telephone Encounter (Signed)
Submitted VOB for Orthovisc series, right knee.

## 2018-11-10 NOTE — Therapy (Signed)
[place], [place], [address] Phone: [phone]   Fax:  [phone]  Physical Therapy Treatment  Patient Details  Name: [name] MRN: [id number] Date of Birth: 11-Nov-1940 Referring Provider (PT): Ninfa Linden   Encounter Date: 11/10/2018  PT End of Session - 11/10/18 1347    Visit Number  3    Date for PT Re-Evaluation  01/03/19    PT Start Time  1310    PT Stop Time  1345    PT Time Calculation (min)  35 min    Activity Tolerance  Patient tolerated treatment well;No increased pain;Patient limited by fatigue    Behavior During Therapy  [facility] for tasks assessed/performed       Past Medical History:  Diagnosis Date  . Acute blood loss anemia   . Acute encephalopathy 07/15/2016  . AKI (acute kidney injury) ([place])   . Anemia    has sickle cell trait  . Anxiety   . Arthritis   . Asthma    has used inhaler in past for asthmatic bronchitis, last time- early 2012  . Bilateral primary osteoarthritis of knee 03/26/2016  . Complication of anesthesia    wakes up shaking  . Diabetes mellitus   . Diabetes mellitus with neuropathy ([place]) 10/29/2012  . Diverticulitis   . Dyslipidemia   . Encephalopathy 06/2016   due to medications after surgery  . Fibromyalgia   . GERD (gastroesophageal reflux disease)    occas. use of  Prilosec  . Heart murmur    sees Dr. Montez Morita, last seen- early 2012  . Herniated nucleus pulposus, L2-3 06/25/2016  . History of back surgery   . Hypertension    02/2010- stress test /w PCP  . Hypothyroidism   . Loose bowel movements 12/2016  . Lumbar radiculopathy 06/27/2016  . Lumbar stenosis with neurogenic claudication 03/17/2017  . Memory disorder 02/16/2016  . Myoclonic jerking   . Neuromuscular disorder (HCC)    lumbar radiculopathy, lumbago  . Nocturnal leg cramps 09/27/2014  . Osteoporosis 03/10/2014  . Pneumonia   . Sickle cell trait ([place])   . Sleep apnea     borderline sleep apnea, states she no longer uses, early 2012- stopped using   . Spinal stenosis of lumbar region with radiculopathy 02/28/2014  . Spondylolisthesis of lumbar region 03/19/2011  . Type II or unspecified type diabetes mellitus without mention of complication, uncontrolled 07/08/2013    Past Surgical History:  Procedure Laterality Date  . ABDOMINAL HYSTERECTOMY    . adb.cyst     ovarian cyst  . BACK SURGERY     2012, 2015 (3 total)  . BACK SURGERY  2018   02/24/2017  . COLONOSCOPY    . EYE SURGERY     macular degeneration treatment - injections  . FEMUR IM NAIL Left 06/13/2018   Procedure: INTRAMEDULLARY (IM) NAIL FEMORAL;  Surgeon: [name], MD;  [facility]: [facility];  Service: Orthopedics;  Laterality: Left;  . OVARIAN CYST SURGERY    . POSTERIOR LUMBAR FUSION 4 LEVEL N/A 03/17/2017   Procedure: Decompression of Lumbar One-Two with Thoracic Ten to Lumbar Two Fusion;  Surgeon: Kristeen Miss, MD;  Location: [place];  Service: Neurosurgery;  Laterality: N/A;  Decompression of L1-2 with T10 to L2 Fusion    There were no vitals filed for this visit.  Subjective Assessment - 11/10/18 1313    Subjective  "I just hurt all the time, I don't sleep  at night"    Pertinent History  has had 5 lumbar surgeries, osteoporosis, right knee DJD    Limitations  Lifting;Standing;Walking;House hold activities    Patient Stated Goals  walk better, walk without device    Currently in Pain?  Yes    Pain Score  6     Pain Location  Flank    Pain Orientation  Left                       [facility] Adult PT Treatment/Exercise - 11/10/18 0001      Knee/Hip Exercises: Aerobic   Recumbent Bike  x 4 minutes    Nustep  level 4 x 4 minutes      Knee/Hip Exercises: Machines for Strengthening   Cybex Knee Extension  5lb 2x10     Cybex Knee Flexion  15lb 2x15     Cybex Leg Press  20lb 2x10     Other Machine  Rows and lats 20lb 2x10       Knee/Hip Exercises: Seated   Long  Arc Quad  Left;2 sets;10 reps    Long Arc Quad Weight  3 lbs.    Abduction/Adduction   Strengthening;2 sets;15 reps    Abd/Adduction Limitations  green tband               PT Short Term Goals - 11/10/18 1352      PT SHORT TERM GOAL #1   Title  independent with initial HEP    Status  Achieved        PT Long Term Goals - 11/10/18 1352      PT LONG TERM GOAL #1   Title  decrease pain 50%    Status  On-going      PT LONG TERM GOAL #2   Title  get up from sitting without pain and without hands    Status  Partially Met            Plan - 11/10/18 1348    Clinical Impression Statement  Pt ~ 10 minutes late for today's treatment session. She reports increase fatigue due to her having an earlier MD appoint ment and her not being able to eat. Progressed to leg press intervention. cues to complete the full ROM. Some HS fatigue with green Tband isolating LLE.    Comorbidities  DJD right knee, 5 lumbar surgeries, osteoporosis    Stability/Clinical Decision Making  Evolving/Moderate complexity    Rehab Potential  Good    PT Frequency  3x / week    PT Treatment/Interventions  ADLs/Self Care Home Management;Electrical Stimulation;Iontophoresis 72m/ml Dexamethasone;Moist Heat;Ultrasound;Functional mobility training;Stair training;Gait training;Therapeutic activities;Therapeutic exercise;Balance training;Neuromuscular re-education;Manual techniques;Patient/family education    PT Next Visit Plan  slowly add exercises  to help with function and walking       Patient will benefit from skilled therapeutic intervention in order to improve the following deficits and impairments:  Abnormal gait, Pain, Postural dysfunction, Increased muscle spasms, Decreased scar mobility, Decreased mobility, Cardiopulmonary status limiting activity, Decreased activity tolerance, Decreased endurance, Decreased range of motion, Decreased strength, Decreased balance, Difficulty walking, Impaired  flexibility  Visit Diagnosis: 1. Pain in left hip   2. Stiffness of left hip, not elsewhere classified   3. Difficulty in walking, not elsewhere classified   4. Muscle weakness (generalized)        Problem List Patient Active Problem List   Diagnosis Date Noted  . Fracture, proximal femur ([place] 06/12/2018  . Femur fracture, left (  [place]) 06/12/2018  . Acute blood loss as cause of postoperative anemia 03/30/2017  . Diabetic polyneuropathy associated with secondary diabetes mellitus ([place]) 03/30/2017  . Asthma 03/30/2017  . Dyslipidemia 03/26/2017  . Lumbar stenosis with neurogenic claudication 03/17/2017  . Spinal stenosis of lumbar region 03/03/2017  . Myoclonic jerking   . Nausea   . Acute encephalopathy 07/15/2016  . Lumbar radiculopathy 06/27/2016  . Hypothyroidism 06/26/2016  . Labile blood glucose   . Surgery, elective   . Post-operative pain   . Sickle cell trait ([place])   . Acute blood loss anemia   . History of back surgery   . AKI (acute kidney injury) ([place])   . Herniated nucleus pulposus, L2-3 06/25/2016  . Bilateral primary osteoarthritis of knee 03/26/2016  . Osteoarthritis, hand 03/26/2016  . Other insomnia 03/26/2016  . Memory disorder 02/16/2016  . Fibromyalgia 09/27/2014  . Nocturnal leg cramps 09/27/2014  . Body mass index (BMI) of 30.0-30.9 in adult 08/04/2014  . Neuropathy 03/10/2014  . Allergic rhinitis 03/10/2014  . Osteoporosis 03/10/2014  . Spinal stenosis of lumbar region with radiculopathy 02/28/2014  . Type 2 diabetes mellitus with complication, with long-term current use of insulin ([place]) 07/08/2013  . Hypokalemia 11/02/2012  . Essential hypertension, benign 11/02/2012  . Diabetes mellitus with neuropathy ([place]) 10/29/2012  . Hyperlipidemia 10/29/2012  . Spondylolisthesis of lumbar region 03/19/2011  . Lumbar radicular pain 03/19/2011    Scot Jun, PTA 11/10/2018, 1:52 PM  [place] [address] [place], [place], [address] Phone: [phone]   Fax:  [phone]  Name: FREDERICK KLINGER MRN: [id number] Date of Birth: Nov 20, 1940

## 2018-11-11 DIAGNOSIS — Z20828 Contact with and (suspected) exposure to other viral communicable diseases: Secondary | ICD-10-CM | POA: Diagnosis not present

## 2018-11-12 ENCOUNTER — Ambulatory Visit: Payer: Medicare Other | Admitting: Physical Therapy

## 2018-11-12 ENCOUNTER — Other Ambulatory Visit: Payer: Self-pay

## 2018-11-12 ENCOUNTER — Encounter: Payer: Self-pay | Admitting: Physical Therapy

## 2018-11-12 DIAGNOSIS — M25652 Stiffness of left hip, not elsewhere classified: Secondary | ICD-10-CM | POA: Diagnosis not present

## 2018-11-12 DIAGNOSIS — M25552 Pain in left hip: Secondary | ICD-10-CM | POA: Diagnosis not present

## 2018-11-12 DIAGNOSIS — R262 Difficulty in walking, not elsewhere classified: Secondary | ICD-10-CM | POA: Diagnosis not present

## 2018-11-12 DIAGNOSIS — M6281 Muscle weakness (generalized): Secondary | ICD-10-CM | POA: Diagnosis not present

## 2018-11-12 NOTE — Therapy (Signed)
[place], [place], [address] Phone: [phone]   Fax:  [phone]  Physical Therapy Treatment  Patient Details  Name: [name] MRN: [id number] Date of Birth: Jul 01, 1940 Referring Provider (PT): Ninfa Linden   Encounter Date: 11/12/2018  PT End of Session - 11/12/18 1343    Visit Number  4    Date for PT Re-Evaluation  01/03/19    PT Start Time  1300    PT Stop Time  5830    PT Time Calculation (min)  43 min    Activity Tolerance  Patient tolerated treatment well;No increased pain;Patient limited by fatigue    Behavior During Therapy  [facility] for tasks assessed/performed       Past Medical History:  Diagnosis Date  . Acute blood loss anemia   . Acute encephalopathy 07/15/2016  . AKI (acute kidney injury) ([place])   . Anemia    has sickle cell trait  . Anxiety   . Arthritis   . Asthma    has used inhaler in past for asthmatic bronchitis, last time- early 2012  . Bilateral primary osteoarthritis of knee 03/26/2016  . Complication of anesthesia    wakes up shaking  . Diabetes mellitus   . Diabetes mellitus with neuropathy ([place]) 10/29/2012  . Diverticulitis   . Dyslipidemia   . Encephalopathy 06/2016   due to medications after surgery  . Fibromyalgia   . GERD (gastroesophageal reflux disease)    occas. use of  Prilosec  . Heart murmur    sees Dr. Montez Morita, last seen- early 2012  . Herniated nucleus pulposus, L2-3 06/25/2016  . History of back surgery   . Hypertension    02/2010- stress test /w PCP  . Hypothyroidism   . Loose bowel movements 12/2016  . Lumbar radiculopathy 06/27/2016  . Lumbar stenosis with neurogenic claudication 03/17/2017  . Memory disorder 02/16/2016  . Myoclonic jerking   . Neuromuscular disorder (HCC)    lumbar radiculopathy, lumbago  . Nocturnal leg cramps 09/27/2014  . Osteoporosis 03/10/2014  . Pneumonia   . Sickle cell trait ([place])   . Sleep apnea     borderline sleep apnea, states she no longer uses, early 2012- stopped using   . Spinal stenosis of lumbar region with radiculopathy 02/28/2014  . Spondylolisthesis of lumbar region 03/19/2011  . Type II or unspecified type diabetes mellitus without mention of complication, uncontrolled 07/08/2013    Past Surgical History:  Procedure Laterality Date  . ABDOMINAL HYSTERECTOMY    . adb.cyst     ovarian cyst  . BACK SURGERY     2012, 2015 (3 total)  . BACK SURGERY  2018   02/24/2017  . COLONOSCOPY    . EYE SURGERY     macular degeneration treatment - injections  . FEMUR IM NAIL Left 06/13/2018   Procedure: INTRAMEDULLARY (IM) NAIL FEMORAL;  Surgeon: [name], MD;  [facility];  Service: Orthopedics;  Laterality: Left;  . OVARIAN CYST SURGERY    . POSTERIOR LUMBAR FUSION 4 LEVEL N/A 03/17/2017   Procedure: Decompression of Lumbar One-Two with Thoracic Ten to Lumbar Two Fusion;  Surgeon: Kristeen Miss, MD;  Location: [place];  Service: Neurosurgery;  Laterality: N/A;  Decompression of L1-2 with T10 to L2 Fusion    There were no vitals filed for this visit.  Subjective Assessment - 11/12/18 1303    Subjective  "I wake up tired, I go to bed tired"  Currently in Pain?  Yes    Pain Score  6     Pain Location  Back                       [facility] Adult PT Treatment/Exercise - 11/12/18 0001      Knee/Hip Exercises: Aerobic   Recumbent Bike  L1 x 4 min     Nustep  level 4 x 5 minutes      Knee/Hip Exercises: Machines for Strengthening   Cybex Knee Extension  5lb 2x10     Cybex Knee Flexion  20lb 3x10     Cybex Leg Press  20lb 3x10       Knee/Hip Exercises: Seated   Long Arc Quad  Left;2 sets;10 reps    Ball Squeeze  2x10    Abduction/Adduction   Strengthening;2 sets;15 reps    Abd/Adduction Limitations  green tband    Sit to [employer]  2 sets;5 reps;without UE support               PT Short Term Goals - 11/12/18 1345      PT SHORT TERM GOAL  #1   Status  Achieved        PT Long Term Goals - 11/12/18 1345      PT LONG TERM GOAL #1   Title  decrease pain 50%    Status  On-going      PT LONG TERM GOAL #2   Title  get up from sitting without pain and without hands    Status  Partially Met      PT LONG TERM GOAL #3   Title  increase left hip flexion to 90 degrees    Status  On-going      PT LONG TERM GOAL #4   Title  walk without assistive device 300 feet    Status  On-going            Plan - 11/12/18 1344    Clinical Impression Statement  Despite increase fatigue she was able to complete all of today's activities. She was able to perform extra sets on the leg press. Cues to do the full ROM with seated leg curls and extensions.    Comorbidities  DJD right knee, 5 lumbar surgeries, osteoporosis    Stability/Clinical Decision Making  Evolving/Moderate complexity    Rehab Potential  Good    PT Frequency  3x / week    PT Duration  8 weeks    PT Treatment/Interventions  ADLs/Self Care Home Management;Electrical Stimulation;Iontophoresis 57m/ml Dexamethasone;Moist Heat;Ultrasound;Functional mobility training;Stair training;Gait training;Therapeutic activities;Therapeutic exercise;Balance training;Neuromuscular re-education;Manual techniques;Patient/family education    PT Next Visit Plan  slowly add exercises  to help with function and walking       Patient will benefit from skilled therapeutic intervention in order to improve the following deficits and impairments:  Abnormal gait, Pain, Postural dysfunction, Increased muscle spasms, Decreased scar mobility, Decreased mobility, Cardiopulmonary status limiting activity, Decreased activity tolerance, Decreased endurance, Decreased range of motion, Decreased strength, Decreased balance, Difficulty walking, Impaired flexibility  Visit Diagnosis: 1. Pain in left hip   2. Stiffness of left hip, not elsewhere classified   3. Difficulty in walking, not elsewhere classified         Problem List Patient Active Problem List   Diagnosis Date Noted  . Fracture, proximal femur ([place] 06/12/2018  . Femur fracture, left ([place] 06/12/2018  . Acute blood loss as cause of postoperative anemia 03/30/2017  .  Diabetic polyneuropathy associated with secondary diabetes mellitus ([place]) 03/30/2017  . Asthma 03/30/2017  . Dyslipidemia 03/26/2017  . Lumbar stenosis with neurogenic claudication 03/17/2017  . Spinal stenosis of lumbar region 03/03/2017  . Myoclonic jerking   . Nausea   . Acute encephalopathy 07/15/2016  . Lumbar radiculopathy 06/27/2016  . Hypothyroidism 06/26/2016  . Labile blood glucose   . Surgery, elective   . Post-operative pain   . Sickle cell trait ([place])   . Acute blood loss anemia   . History of back surgery   . AKI (acute kidney injury) ([place])   . Herniated nucleus pulposus, L2-3 06/25/2016  . Bilateral primary osteoarthritis of knee 03/26/2016  . Osteoarthritis, hand 03/26/2016  . Other insomnia 03/26/2016  . Memory disorder 02/16/2016  . Fibromyalgia 09/27/2014  . Nocturnal leg cramps 09/27/2014  . Body mass index (BMI) of 30.0-30.9 in adult 08/04/2014  . Neuropathy 03/10/2014  . Allergic rhinitis 03/10/2014  . Osteoporosis 03/10/2014  . Spinal stenosis of lumbar region with radiculopathy 02/28/2014  . Type 2 diabetes mellitus with complication, with long-term current use of insulin ([place]) 07/08/2013  . Hypokalemia 11/02/2012  . Essential hypertension, benign 11/02/2012  . Diabetes mellitus with neuropathy ([place]) 10/29/2012  . Hyperlipidemia 10/29/2012  . Spondylolisthesis of lumbar region 03/19/2011  . Lumbar radicular pain 03/19/2011    Scot Jun, PTA 11/12/2018, 1:46 PM  [place], [place], [address] Phone: [phone]   Fax:  [phone]  Name: [name] MRN: [id number] Date of Birth: 03/27/1941

## 2018-11-16 ENCOUNTER — Other Ambulatory Visit: Payer: Self-pay

## 2018-11-16 ENCOUNTER — Encounter: Payer: Self-pay | Admitting: Physical Therapy

## 2018-11-16 ENCOUNTER — Ambulatory Visit: Payer: Medicare Other | Admitting: Physical Therapy

## 2018-11-16 DIAGNOSIS — R262 Difficulty in walking, not elsewhere classified: Secondary | ICD-10-CM | POA: Diagnosis not present

## 2018-11-16 DIAGNOSIS — M6281 Muscle weakness (generalized): Secondary | ICD-10-CM | POA: Diagnosis not present

## 2018-11-16 DIAGNOSIS — M25552 Pain in left hip: Secondary | ICD-10-CM | POA: Diagnosis not present

## 2018-11-16 DIAGNOSIS — M25652 Stiffness of left hip, not elsewhere classified: Secondary | ICD-10-CM

## 2018-11-16 NOTE — Patient Instructions (Signed)
Access Code: [email or web address]  URL: [email or web address]  Date: 11/16/2018  Prepared by: Cheri Fowler   Exercises  Sit to Stand without Arm Support - 10 reps - 3 sets - 1x daily - 7x weekly  March in Place - 10 reps - 3 sets - 1x daily - 7x weekly

## 2018-11-16 NOTE — Therapy (Signed)
[place], [place], [address] Phone: [phone]   Fax:  [phone]  Physical Therapy Treatment  Patient Details  Name: [name] MRN: [id number] Date of Birth: [birth year] Referring Provider (PT): Ninfa Linden   Encounter Date: 11/16/2018  PT End of Session - 11/16/18 1342    Visit Number  5    Date for PT Re-Evaluation  01/03/19    PT Start Time  4650    PT Stop Time  1345    PT Time Calculation (min)  40 min    Activity Tolerance  Patient tolerated treatment well;No increased pain    Behavior During Therapy  [facility] for tasks assessed/performed       Past Medical History:  Diagnosis Date  . Acute blood loss anemia   . Acute encephalopathy 07/15/2016  . AKI (acute kidney injury) ([place])   . Anemia    has sickle cell trait  . Anxiety   . Arthritis   . Asthma    has used inhaler in past for asthmatic bronchitis, last time- early 2012  . Bilateral primary osteoarthritis of knee 03/26/2016  . Complication of anesthesia    wakes up shaking  . Diabetes mellitus   . Diabetes mellitus with neuropathy ([place]) 10/29/2012  . Diverticulitis   . Dyslipidemia   . Encephalopathy 06/2016   due to medications after surgery  . Fibromyalgia   . GERD (gastroesophageal reflux disease)    occas. use of  Prilosec  . Heart murmur    sees Dr. Montez Morita, last seen- early 2012  . Herniated nucleus pulposus, L2-3 06/25/2016  . History of back surgery   . Hypertension    02/2010- stress test /w PCP  . Hypothyroidism   . Loose bowel movements 12/2016  . Lumbar radiculopathy 06/27/2016  . Lumbar stenosis with neurogenic claudication 03/17/2017  . Memory disorder 02/16/2016  . Myoclonic jerking   . Neuromuscular disorder (HCC)    lumbar radiculopathy, lumbago  . Nocturnal leg cramps 09/27/2014  . Osteoporosis 03/10/2014  . Pneumonia   . Sickle cell trait ([place])   . Sleep apnea    borderline sleep apnea, states she  no longer uses, early 2012- stopped using   . Spinal stenosis of lumbar region with radiculopathy 02/28/2014  . Spondylolisthesis of lumbar region 03/19/2011  . Type II or unspecified type diabetes mellitus without mention of complication, uncontrolled 07/08/2013    Past Surgical History:  Procedure Laterality Date  . ABDOMINAL HYSTERECTOMY    . adb.cyst     ovarian cyst  . BACK SURGERY     2012, 2015 (3 total)  . BACK SURGERY  2018   02/24/2017  . COLONOSCOPY    . EYE SURGERY     macular degeneration treatment - injections  . FEMUR IM NAIL Left 06/13/2018   Procedure: INTRAMEDULLARY (IM) NAIL FEMORAL;  Surgeon: [name], MD;  [facility];  Service: Orthopedics;  Laterality: Left;  . OVARIAN CYST SURGERY    . POSTERIOR LUMBAR FUSION 4 LEVEL N/A 03/17/2017   Procedure: Decompression of Lumbar One-Two with Thoracic Ten to Lumbar Two Fusion;  Surgeon: Kristeen Miss, MD;  Location: [place];  Service: Neurosurgery;  Laterality: N/A;  Decompression of L1-2 with T10 to L2 Fusion    There were no vitals filed for this visit.  Subjective Assessment - 11/16/18 1308    Subjective  "I feel better but Im sore"    Pertinent History  has had 5 lumbar surgeries, osteoporosis, right knee DJD    Currently in Pain?  Yes    Pain Score  7     Pain Location  Back                       [facility] Adult PT Treatment/Exercise - 11/16/18 0001      High Level Balance   High Level Balance Activities  Side stepping;Backward walking;Tandem walking      Knee/Hip Exercises: Aerobic   Recumbent Bike  L0 x 4 min     Nustep  level 5 x 6 minutes      Knee/Hip Exercises: Machines for Strengthening   Cybex Leg Press  30lb 3x10       Knee/Hip Exercises: Seated   Ball Squeeze  2x10    Sit to Sand  2 sets;10 reps;without UE support               PT Short Term Goals - 11/12/18 1345      PT SHORT TERM GOAL #1   Status  Achieved        PT Long Term Goals - 11/12/18  1345      PT LONG TERM GOAL #1   Title  decrease pain 50%    Status  On-going      PT LONG TERM GOAL #2   Title  get up from sitting without pain and without hands    Status  Partially Met      PT LONG TERM GOAL #3   Title  increase left hip flexion to 90 degrees    Status  On-going      PT LONG TERM GOAL #4   Title  walk without assistive device 300 feet    Status  On-going            Plan - 11/16/18 1342    Clinical Impression Statement  Pt ~ 5 minutes late for today's session. Pt tolerated today's interventions well, evident by no subjective reports of increase pain. Progressed to ome balance interventions with SBA. Cues needed to take bigger steps with backwards walking. Fatigue quick with standing marches.    Comorbidities  DJD right knee, 5 lumbar surgeries, osteoporosis    Stability/Clinical Decision Making  Evolving/Moderate complexity    Rehab Potential  Good    PT Frequency  3x / week    PT Duration  8 weeks    PT Treatment/Interventions  ADLs/Self Care Home Management;Electrical Stimulation;Iontophoresis 108m/ml Dexamethasone;Moist Heat;Ultrasound;Functional mobility training;Stair training;Gait training;Therapeutic activities;Therapeutic exercise;Balance training;Neuromuscular re-education;Manual techniques;Patient/family education    PT Next Visit Plan  slowly add exercises  to help with function and walking       Patient will benefit from skilled therapeutic intervention in order to improve the following deficits and impairments:  Abnormal gait, Pain, Postural dysfunction, Increased muscle spasms, Decreased scar mobility, Decreased mobility, Cardiopulmonary status limiting activity, Decreased activity tolerance, Decreased endurance, Decreased range of motion, Decreased strength, Decreased balance, Difficulty walking, Impaired flexibility  Visit Diagnosis: 1. Stiffness of left hip, not elsewhere classified   2. Difficulty in walking, not elsewhere classified   3.  Pain in left hip        Problem List Patient Active Problem List   Diagnosis Date Noted  . Fracture, proximal femur ([facility] 06/12/2018  . Femur fracture, left ([place] 06/12/2018  . Acute blood loss as cause of postoperative anemia 03/30/2017  . Diabetic polyneuropathy associated with secondary diabetes mellitus ([place] 03/30/2017  .  Asthma 03/30/2017  . Dyslipidemia 03/26/2017  . Lumbar stenosis with neurogenic claudication 03/17/2017  . Spinal stenosis of lumbar region 03/03/2017  . Myoclonic jerking   . Nausea   . Acute encephalopathy 07/15/2016  . Lumbar radiculopathy 06/27/2016  . Hypothyroidism 06/26/2016  . Labile blood glucose   . Surgery, elective   . Post-operative pain   . Sickle cell trait ([place])   . Acute blood loss anemia   . History of back surgery   . AKI (acute kidney injury) ([place])   . Herniated nucleus pulposus, L2-3 06/25/2016  . Bilateral primary osteoarthritis of knee 03/26/2016  . Osteoarthritis, hand 03/26/2016  . Other insomnia 03/26/2016  . Memory disorder 02/16/2016  . Fibromyalgia 09/27/2014  . Nocturnal leg cramps 09/27/2014  . Body mass index (BMI) of 30.0-30.9 in adult 08/04/2014  . Neuropathy 03/10/2014  . Allergic rhinitis 03/10/2014  . Osteoporosis 03/10/2014  . Spinal stenosis of lumbar region with radiculopathy 02/28/2014  . Type 2 diabetes mellitus with complication, with long-term current use of insulin ([place]) 07/08/2013  . Hypokalemia 11/02/2012  . Essential hypertension, benign 11/02/2012  . Diabetes mellitus with neuropathy ([place]) 10/29/2012  . Hyperlipidemia 10/29/2012  . Spondylolisthesis of lumbar region 03/19/2011  . Lumbar radicular pain 03/19/2011    Scot Jun, PTA 11/16/2018, 1:48 PM  [place], [place], [address] Phone: [phone]   Fax:  [phone]  Name: [name] MRN: [id number] Date of Birth: 1940/11/18

## 2018-11-19 ENCOUNTER — Ambulatory Visit: Payer: Medicare Other | Admitting: Physical Therapy

## 2018-11-19 ENCOUNTER — Other Ambulatory Visit: Payer: Self-pay

## 2018-11-19 ENCOUNTER — Encounter: Payer: Self-pay | Admitting: Physical Therapy

## 2018-11-19 DIAGNOSIS — M25552 Pain in left hip: Secondary | ICD-10-CM

## 2018-11-19 DIAGNOSIS — R262 Difficulty in walking, not elsewhere classified: Secondary | ICD-10-CM

## 2018-11-19 DIAGNOSIS — M6281 Muscle weakness (generalized): Secondary | ICD-10-CM | POA: Diagnosis not present

## 2018-11-19 DIAGNOSIS — M25652 Stiffness of left hip, not elsewhere classified: Secondary | ICD-10-CM

## 2018-11-19 NOTE — Therapy (Signed)
[place], [place], [address] Phone: [phone]   Fax:  [phone]  Physical Therapy Treatment  Patient Details  Name: [name] MRN: [id number] Date of Birth: October 30, 1940 Referring Provider (PT): Hedy Camara Date: 11/19/2018  PT End of Session - 11/19/18 1344    Visit Number  6    Date for PT Re-Evaluation  01/03/19    PT Start Time  0254    PT Stop Time  1344    PT Time Calculation (min)  39 min    Activity Tolerance  Patient tolerated treatment well;No increased pain    Behavior During Therapy  WFL for tasks assessed/performed       Past Medical History:  Diagnosis Date  . Acute blood loss anemia   . Acute encephalopathy 07/15/2016  . AKI (acute kidney injury) ([place])   . Anemia    has sickle cell trait  . Anxiety   . Arthritis   . Asthma    has used inhaler in past for asthmatic bronchitis, last time- early 2012  . Bilateral primary osteoarthritis of knee 03/26/2016  . Complication of anesthesia    wakes up shaking  . Diabetes mellitus   . Diabetes mellitus with neuropathy ([place]) 10/29/2012  . Diverticulitis   . Dyslipidemia   . Encephalopathy 06/2016   due to medications after surgery  . Fibromyalgia   . GERD (gastroesophageal reflux disease)    occas. use of  Prilosec  . Heart murmur    sees Dr. Montez Morita, last seen- early 2012  . Herniated nucleus pulposus, L2-3 06/25/2016  . History of back surgery   . Hypertension    02/2010- stress test /w PCP  . Hypothyroidism   . Loose bowel movements 12/2016  . Lumbar radiculopathy 06/27/2016  . Lumbar stenosis with neurogenic claudication 03/17/2017  . Memory disorder 02/16/2016  . Myoclonic jerking   . Neuromuscular disorder (HCC)    lumbar radiculopathy, lumbago  . Nocturnal leg cramps 09/27/2014  . Osteoporosis 03/10/2014  . Pneumonia   . Sickle cell trait ([place])   . Sleep apnea    borderline sleep apnea, states she  no longer uses, early 2012- stopped using   . Spinal stenosis of lumbar region with radiculopathy 02/28/2014  . Spondylolisthesis of lumbar region 03/19/2011  . Type II or unspecified type diabetes mellitus without mention of complication, uncontrolled 07/08/2013    Past Surgical History:  Procedure Laterality Date  . ABDOMINAL HYSTERECTOMY    . adb.cyst     ovarian cyst  . BACK SURGERY     2012, 2015 (3 total)  . BACK SURGERY  2018   02/24/2017  . COLONOSCOPY    . EYE SURGERY     macular degeneration treatment - injections  . FEMUR IM NAIL Left 06/13/2018   Procedure: INTRAMEDULLARY (IM) NAIL FEMORAL;  Surgeon: [name], MD;  [facility];  Service: Orthopedics;  Laterality: Left;  . OVARIAN CYST SURGERY    . POSTERIOR LUMBAR FUSION 4 LEVEL N/A 03/17/2017   Procedure: Decompression of Lumbar One-Two with Thoracic Ten to Lumbar Two Fusion;  Surgeon: Kristeen Miss, MD;  Location: [facility];  Service: Neurosurgery;  Laterality: N/A;  Decompression of L1-2 with T10 to L2 Fusion    There were no vitals filed for this visit.  Subjective Assessment - 11/19/18 1307    Subjective  "I feel ok"    Currently in Pain?  Yes  Pain Score  7     Pain Location  Back         [facility] PT Assessment - 11/19/18 0001      AROM   Left Hip Flexion  78      Standardized Balance Assessment   Standardized Balance Assessment  Timed Up and Go Test      Timed Up and Go Test   Normal TUG (seconds)  26.76                   [facility] Adult PT Treatment/Exercise - 11/19/18 0001      High Level Balance   High Level Balance Activities  Side stepping;Backward walking      Knee/Hip Exercises: Aerobic   Recumbent Bike  L1 x 4 min     Nustep  level 5 x 6 minutes      Knee/Hip Exercises: Machines for Strengthening   Cybex Knee Extension  5lb 2x10, LLE 5lb x6    Cybex Knee Flexion  20lb 2x10, LLE 10lb 2x10        Knee/Hip Exercises: Standing   Hip Flexion  Left;2 sets;15  reps;Knee bent;Stengthening    Hip Flexion Limitations  2    Other Standing Knee Exercises  LLE abd 2x10      Knee/Hip Exercises: Seated   Sit to Sand  2 sets;10 reps;without UE support   Airex on mat table              PT Short Term Goals - 11/12/18 1345      PT SHORT TERM GOAL #1   Status  Achieved        PT Long Term Goals - 11/19/18 1319      PT LONG TERM GOAL #1   Title  decrease pain 50%    Status  On-going      PT LONG TERM GOAL #2   Title  get up from sitting without pain and without hands    Status  Achieved            Plan - 11/19/18 1344    Clinical Impression Statement  Pt ~ 5 minutes late for today's treatment. She did well with balance interventions. Cues to keep hips square when side stepping to her R. Progressed to some SL strengthen exercises, but she did fatigue quick. She has progressed increasing her L hip AROM. Hip flex and abd was taxing on patient.    Comorbidities  DJD right knee, 5 lumbar surgeries, osteoporosis    Stability/Clinical Decision Making  Evolving/Moderate complexity    Rehab Potential  Good    PT Duration  8 weeks    PT Treatment/Interventions  ADLs/Self Care Home Management;Electrical Stimulation;Iontophoresis 4mg /ml Dexamethasone;Moist Heat;Ultrasound;Functional mobility training;Stair training;Gait training;Therapeutic activities;Therapeutic exercise;Balance training;Neuromuscular re-education;Manual techniques;Patient/family education    PT Next Visit Plan  slowly add exercises  to help with function and walking       Patient will benefit from skilled therapeutic intervention in order to improve the following deficits and impairments:  Abnormal gait, Pain, Postural dysfunction, Increased muscle spasms, Decreased scar mobility, Decreased mobility, Cardiopulmonary status limiting activity, Decreased activity tolerance, Decreased endurance, Decreased range of motion, Decreased strength, Decreased balance, Difficulty walking,  Impaired flexibility  Visit Diagnosis: 1. Difficulty in walking, not elsewhere classified   2. Pain in left hip   3. Stiffness of left hip, not elsewhere classified   4. Muscle weakness (generalized)        Problem List Patient Active Problem List  Diagnosis Date Noted  . Fracture, proximal femur ([place]) 06/12/2018  . Femur fracture, left ([place]) 06/12/2018  . Acute blood loss as cause of postoperative anemia 03/30/2017  . Diabetic polyneuropathy associated with secondary diabetes mellitus ([place]) 03/30/2017  . Asthma 03/30/2017  . Dyslipidemia 03/26/2017  . Lumbar stenosis with neurogenic claudication 03/17/2017  . Spinal stenosis of lumbar region 03/03/2017  . Myoclonic jerking   . Nausea   . Acute encephalopathy 07/15/2016  . Lumbar radiculopathy 06/27/2016  . Hypothyroidism 06/26/2016  . Labile blood glucose   . Surgery, elective   . Post-operative pain   . Sickle cell trait ([place])   . Acute blood loss anemia   . History of back surgery   . AKI (acute kidney injury) ([place])   . Herniated nucleus pulposus, L2-3 06/25/2016  . Bilateral primary osteoarthritis of knee 03/26/2016  . Osteoarthritis, hand 03/26/2016  . Other insomnia 03/26/2016  . Memory disorder 02/16/2016  . Fibromyalgia 09/27/2014  . Nocturnal leg cramps 09/27/2014  . Body mass index (BMI) of 30.0-30.9 in adult 08/04/2014  . Neuropathy 03/10/2014  . Allergic rhinitis 03/10/2014  . Osteoporosis 03/10/2014  . Spinal stenosis of lumbar region with radiculopathy 02/28/2014  . Type 2 diabetes mellitus with complication, with long-term current use of insulin ([place]) 07/08/2013  . Hypokalemia 11/02/2012  . Essential hypertension, benign 11/02/2012  . Diabetes mellitus with neuropathy ([place]) 10/29/2012  . Hyperlipidemia 10/29/2012  . Spondylolisthesis of lumbar region 03/19/2011  . Lumbar radicular pain 03/19/2011    Scot Jun 11/19/2018, 1:48 PM  [place], [place], [address] Phone: [phone]   Fax:  [phone]  Name: [name] MRN: [id number] Date of Birth: [birth year]

## 2018-11-24 ENCOUNTER — Ambulatory Visit: Payer: Medicare Other | Attending: Orthopaedic Surgery | Admitting: Physical Therapy

## 2018-11-24 ENCOUNTER — Telehealth: Payer: Self-pay

## 2018-11-24 ENCOUNTER — Other Ambulatory Visit: Payer: Self-pay

## 2018-11-24 ENCOUNTER — Encounter: Payer: Self-pay | Admitting: Physical Therapy

## 2018-11-24 DIAGNOSIS — M545 Low back pain, unspecified: Secondary | ICD-10-CM

## 2018-11-24 DIAGNOSIS — R262 Difficulty in walking, not elsewhere classified: Secondary | ICD-10-CM | POA: Diagnosis not present

## 2018-11-24 DIAGNOSIS — M25652 Stiffness of left hip, not elsewhere classified: Secondary | ICD-10-CM | POA: Diagnosis not present

## 2018-11-24 DIAGNOSIS — M25552 Pain in left hip: Secondary | ICD-10-CM

## 2018-11-24 DIAGNOSIS — M6281 Muscle weakness (generalized): Secondary | ICD-10-CM | POA: Diagnosis not present

## 2018-11-24 NOTE — Therapy (Signed)
[place], [place], [address] Phone: [phone]   Fax:  [phone]  Physical Therapy Treatment  Patient Details  Name: [name] MRN: [id number] Date of Birth: 1941/02/07 Referring Provider (PT): Hedy Camara Date: 11/24/2018  PT End of Session - 11/24/18 1518    Visit Number  7    Date for PT Re-Evaluation  01/03/19    PT Start Time  4431    PT Stop Time  1518    PT Time Calculation (min)  45 min    Activity Tolerance  Patient tolerated treatment well;No increased pain    Behavior During Therapy  WFL for tasks assessed/performed       Past Medical History:  Diagnosis Date  . Acute blood loss anemia   . Acute encephalopathy 07/15/2016  . AKI (acute kidney injury) ([place])   . Anemia    has sickle cell trait  . Anxiety   . Arthritis   . Asthma    has used inhaler in past for asthmatic bronchitis, last time- early 2012  . Bilateral primary osteoarthritis of knee 03/26/2016  . Complication of anesthesia    wakes up shaking  . Diabetes mellitus   . Diabetes mellitus with neuropathy ([place]) 10/29/2012  . Diverticulitis   . Dyslipidemia   . Encephalopathy 06/2016   due to medications after surgery  . Fibromyalgia   . GERD (gastroesophageal reflux disease)    occas. use of  Prilosec  . Heart murmur    sees Dr. Montez Morita, last seen- early 2012  . Herniated nucleus pulposus, L2-3 06/25/2016  . History of back surgery   . Hypertension    02/2010- stress test /w PCP  . Hypothyroidism   . Loose bowel movements 12/2016  . Lumbar radiculopathy 06/27/2016  . Lumbar stenosis with neurogenic claudication 03/17/2017  . Memory disorder 02/16/2016  . Myoclonic jerking   . Neuromuscular disorder (HCC)    lumbar radiculopathy, lumbago  . Nocturnal leg cramps 09/27/2014  . Osteoporosis 03/10/2014  . Pneumonia   . Sickle cell trait ([place])   . Sleep apnea    borderline sleep apnea, states she  no longer uses, early 2012- stopped using   . Spinal stenosis of lumbar region with radiculopathy 02/28/2014  . Spondylolisthesis of lumbar region 03/19/2011  . Type II or unspecified type diabetes mellitus without mention of complication, uncontrolled 07/08/2013    Past Surgical History:  Procedure Laterality Date  . ABDOMINAL HYSTERECTOMY    . adb.cyst     ovarian cyst  . BACK SURGERY     2012, 2015 (3 total)  . BACK SURGERY  2018   02/24/2017  . COLONOSCOPY    . EYE SURGERY     macular degeneration treatment - injections  . FEMUR IM NAIL Left 06/13/2018   Procedure: INTRAMEDULLARY (IM) NAIL FEMORAL;  Surgeon: [name], MD;  [facility];  Service: Orthopedics;  Laterality: Left;  . OVARIAN CYST SURGERY    . POSTERIOR LUMBAR FUSION 4 LEVEL N/A 03/17/2017   Procedure: Decompression of Lumbar One-Two with Thoracic Ten to Lumbar Two Fusion;  Surgeon: Kristeen Miss, MD;  Location: [facility];  Service: Neurosurgery;  Laterality: N/A;  Decompression of L1-2 with T10 to L2 Fusion    There were no vitals filed for this visit.  Subjective Assessment - 11/24/18 1433    Subjective  "feeling ok"    Pertinent History  has had 5 lumbar  surgeries, osteoporosis, right knee DJD    Limitations  Lifting;Standing;Walking;House hold activities    Patient Stated Goals  walk better, walk without device    Currently in Pain?  Yes    Pain Score  5     Pain Location  Leg    Pain Orientation  Left;Right                       [facility] Adult PT Treatment/Exercise - 11/24/18 0001      Knee/Hip Exercises: Aerobic   Nustep  level 45 x 6 minutes      Knee/Hip Exercises: Machines for Strengthening   Cybex Knee Extension  10lb 2x10 5lb x10     Cybex Knee Flexion  20lb 2x10, LLE 15lb x10      Cybex Leg Press  30lb 2x10, SL 20lb x 10 each then extra set x5 with LLE       Knee/Hip Exercises: Standing   Lateral Step Up  Left;1 set;10 reps;Hand Hold: 1;Step Height: 4"    Forward  Step Up  Left;1 set;10 reps;Step Height: 4";Hand Hold: 2    Walking with Sports Cord  20 2 way x 3 each                PT Short Term Goals - 11/12/18 1345      PT SHORT TERM GOAL #1   Status  Achieved        PT Long Term Goals - 11/19/18 1319      PT LONG TERM GOAL #1   Title  decrease pain 50%    Status  On-going      PT LONG TERM GOAL #2   Title  get up from sitting without pain and without hands    Status  Achieved            Plan - 11/24/18 1518    Clinical Impression Statement  Pt still ambulated with a limp. progressed to some more SL strengthening. Some assist needed with SL on leg press. Cues for TKE with seated extensions. Attempted resisted side step but unable to tolerated due to fatigue and subjective reports of LLE pain.    Comorbidities  DJD right knee, 5 lumbar surgeries, osteoporosis    Stability/Clinical Decision Making  Evolving/Moderate complexity    Rehab Potential  Good    PT Frequency  3x / week    PT Treatment/Interventions  ADLs/Self Care Home Management;Electrical Stimulation;Iontophoresis 4mg /ml Dexamethasone;Moist Heat;Ultrasound;Functional mobility training;Stair training;Gait training;Therapeutic activities;Therapeutic exercise;Balance training;Neuromuscular re-education;Manual techniques;Patient/family education    PT Next Visit Plan  slowly add exercises  to help with function and walking       Patient will benefit from skilled therapeutic intervention in order to improve the following deficits and impairments:  Abnormal gait, Pain, Postural dysfunction, Increased muscle spasms, Decreased scar mobility, Decreased mobility, Cardiopulmonary status limiting activity, Decreased activity tolerance, Decreased endurance, Decreased range of motion, Decreased strength, Decreased balance, Difficulty walking, Impaired flexibility  Visit Diagnosis: 1. Difficulty in walking, not elsewhere classified   2. Stiffness of left hip, not elsewhere  classified   3. Muscle weakness (generalized)   4. Acute bilateral low back pain without sciatica   5. Pain in left hip        Problem List Patient Active Problem List   Diagnosis Date Noted  . Fracture, proximal femur ([place]) 06/12/2018  . Femur fracture, left ([place]) 06/12/2018  . Acute blood loss as cause of postoperative anemia 03/30/2017  .  Diabetic polyneuropathy associated with secondary diabetes mellitus ([place]) 03/30/2017  . Asthma 03/30/2017  . Dyslipidemia 03/26/2017  . Lumbar stenosis with neurogenic claudication 03/17/2017  . Spinal stenosis of lumbar region 03/03/2017  . Myoclonic jerking   . Nausea   . Acute encephalopathy 07/15/2016  . Lumbar radiculopathy 06/27/2016  . Hypothyroidism 06/26/2016  . Labile blood glucose   . Surgery, elective   . Post-operative pain   . Sickle cell trait ([place])   . Acute blood loss anemia   . History of back surgery   . AKI (acute kidney injury) ([place])   . Herniated nucleus pulposus, L2-3 06/25/2016  . Bilateral primary osteoarthritis of knee 03/26/2016  . Osteoarthritis, hand 03/26/2016  . Other insomnia 03/26/2016  . Memory disorder 02/16/2016  . Fibromyalgia 09/27/2014  . Nocturnal leg cramps 09/27/2014  . Body mass index (BMI) of 30.0-30.9 in adult 08/04/2014  . Neuropathy 03/10/2014  . Allergic rhinitis 03/10/2014  . Osteoporosis 03/10/2014  . Spinal stenosis of lumbar region with radiculopathy 02/28/2014  . Type 2 diabetes mellitus with complication, with long-term current use of insulin ([place]) 07/08/2013  . Hypokalemia 11/02/2012  . Essential hypertension, benign 11/02/2012  . Diabetes mellitus with neuropathy ([place]) 10/29/2012  . Hyperlipidemia 10/29/2012  . Spondylolisthesis of lumbar region 03/19/2011  . Lumbar radicular pain 03/19/2011    Scot Jun, PTA 11/24/2018, 3:22 PM  [place], [place], [address] Phone:  [phone]   Fax:  [phone]  Name: [name] MRN: [id number] Date of Birth: 02-26-1941

## 2018-11-24 NOTE — Telephone Encounter (Signed)
Please schedule patient an appointment with Dr. Estanislado Pandy or Lovena Le for gel injection.  Thank you.  Approved for Orthovisc series, right knee. [place] Patient's secondary insurance will pick up remaining eligible expenses at 20%. No Co-pay No PA required

## 2018-11-26 ENCOUNTER — Ambulatory Visit: Payer: Medicare Other | Admitting: Physical Therapy

## 2018-12-01 ENCOUNTER — Other Ambulatory Visit: Payer: Self-pay | Admitting: Internal Medicine

## 2018-12-01 ENCOUNTER — Ambulatory Visit
Admission: RE | Admit: 2018-12-01 | Discharge: 2018-12-01 | Disposition: A | Discharge status: Home / Self Care | Payer: Medicare Other | Source: Ambulatory Visit | Attending: Internal Medicine | Admitting: Internal Medicine

## 2018-12-01 DIAGNOSIS — R05 Cough: Secondary | ICD-10-CM | POA: Diagnosis not present

## 2018-12-01 DIAGNOSIS — R062 Wheezing: Secondary | ICD-10-CM | POA: Diagnosis not present

## 2018-12-01 DIAGNOSIS — R059 Cough, unspecified: Secondary | ICD-10-CM

## 2018-12-01 NOTE — Telephone Encounter (Signed)
LMOM (home & cell) for patient to call and schedule Orthovisc injections for right knee.

## 2018-12-04 DIAGNOSIS — M4712 Other spondylosis with myelopathy, cervical region: Secondary | ICD-10-CM | POA: Diagnosis not present

## 2018-12-06 ENCOUNTER — Other Ambulatory Visit: Payer: Self-pay | Admitting: Endocrinology

## 2018-12-07 ENCOUNTER — Other Ambulatory Visit: Payer: Self-pay | Admitting: Neurological Surgery

## 2018-12-07 DIAGNOSIS — M4712 Other spondylosis with myelopathy, cervical region: Secondary | ICD-10-CM

## 2018-12-08 ENCOUNTER — Encounter: Payer: Self-pay | Admitting: Physical Therapy

## 2018-12-08 ENCOUNTER — Ambulatory Visit: Payer: Medicare Other | Admitting: Physical Therapy

## 2018-12-08 ENCOUNTER — Other Ambulatory Visit: Payer: Self-pay

## 2018-12-08 DIAGNOSIS — M545 Low back pain: Secondary | ICD-10-CM | POA: Diagnosis not present

## 2018-12-08 DIAGNOSIS — R262 Difficulty in walking, not elsewhere classified: Secondary | ICD-10-CM

## 2018-12-08 DIAGNOSIS — M6281 Muscle weakness (generalized): Secondary | ICD-10-CM | POA: Diagnosis not present

## 2018-12-08 DIAGNOSIS — M25552 Pain in left hip: Secondary | ICD-10-CM | POA: Diagnosis not present

## 2018-12-08 DIAGNOSIS — M25652 Stiffness of left hip, not elsewhere classified: Secondary | ICD-10-CM

## 2018-12-08 NOTE — Therapy (Signed)
[place], [place], [address] Phone: [phone]   Fax:  [phone]  Physical Therapy Treatment  Patient Details  Name: [name] MRN: [id number] Date of Birth: 05/18/1940 Referring Provider (PT): Ninfa Linden   Encounter Date: 12/08/2018  PT End of Session - 12/08/18 2376    Visit Number  8    Date for PT Re-Evaluation  01/03/19    PT Start Time  2831    PT Stop Time  1655    PT Time Calculation (min)  45 min    Activity Tolerance  Patient tolerated treatment well;No increased pain;Patient limited by lethargy    Behavior During Therapy  [facility] for tasks assessed/performed       Past Medical History:  Diagnosis Date  . Acute blood loss anemia   . Acute encephalopathy 07/15/2016  . AKI (acute kidney injury) ([place])   . Anemia    has sickle cell trait  . Anxiety   . Arthritis   . Asthma    has used inhaler in past for asthmatic bronchitis, last time- early 2012  . Bilateral primary osteoarthritis of knee 03/26/2016  . Complication of anesthesia    wakes up shaking  . Diabetes mellitus   . Diabetes mellitus with neuropathy ([place]) 10/29/2012  . Diverticulitis   . Dyslipidemia   . Encephalopathy 06/2016   due to medications after surgery  . Fibromyalgia   . GERD (gastroesophageal reflux disease)    occas. use of  Prilosec  . Heart murmur    sees Dr. Montez Morita, last seen- early 2012  . Herniated nucleus pulposus, L2-3 06/25/2016  . History of back surgery   . Hypertension    02/2010- stress test /w PCP  . Hypothyroidism   . Loose bowel movements 12/2016  . Lumbar radiculopathy 06/27/2016  . Lumbar stenosis with neurogenic claudication 03/17/2017  . Memory disorder 02/16/2016  . Myoclonic jerking   . Neuromuscular disorder (HCC)    lumbar radiculopathy, lumbago  . Nocturnal leg cramps 09/27/2014  . Osteoporosis 03/10/2014  . Pneumonia   . Sickle cell trait ([place])   . Sleep apnea    borderline sleep apnea, states she no longer uses, early 2012- stopped using   . Spinal stenosis of lumbar region with radiculopathy 02/28/2014  . Spondylolisthesis of lumbar region 03/19/2011  . Type II or unspecified type diabetes mellitus without mention of complication, uncontrolled 07/08/2013    Past Surgical History:  Procedure Laterality Date  . ABDOMINAL HYSTERECTOMY    . adb.cyst     ovarian cyst  . BACK SURGERY     2012, 2015 (3 total)  . BACK SURGERY  2018   02/24/2017  . COLONOSCOPY    . EYE SURGERY     macular degeneration treatment - injections  . FEMUR IM NAIL Left 06/13/2018   Procedure: INTRAMEDULLARY (IM) NAIL FEMORAL;  Surgeon: [name], MD;  [facility];  Service: Orthopedics;  Laterality: Left;  . OVARIAN CYST SURGERY    . POSTERIOR LUMBAR FUSION 4 LEVEL N/A 03/17/2017   Procedure: Decompression of Lumbar One-Two with Thoracic Ten to Lumbar Two Fusion;  Surgeon: Kristeen Miss, MD;  Location: [facility];  Service: Neurosurgery;  Laterality: N/A;  Decompression of L1-2 with T10 to L2 Fusion    There were no vitals filed for this visit.  Subjective Assessment - 12/08/18 1615    Subjective  Patient stopped coming in due to not feeling well, she  had a covid test that was negative.  She also reports that she spoke with her neurosurgeon and will be having an MRI in September.  She reports to me that she is dto do only leg exercises not to do any arm exercises or back exercises.    Currently in Pain?  Yes    Pain Score  3     Pain Location  Leg    Pain Orientation  Left    Aggravating Factors   worse with activities                       [facility] Adult PT Treatment/Exercise - 12/08/18 0001      High Level Balance   High Level Balance Activities  Side stepping;Backward walking    High Level Balance Comments  practiced walking fast with HHA and really a little pulling along to get her moving, she moves very slow and lethargic      Knee/Hip  Exercises: Aerobic   Recumbent Bike  bike x 6 minutes      Knee/Hip Exercises: Machines for Strengthening   Cybex Knee Extension  10lb 2x10 5lb x10     Cybex Knee Flexion  20lb 2x10, LLE 15lb x10        Knee/Hip Exercises: Standing   Hip Flexion  Both;2 sets;10 reps    Hip Flexion Limitations  2.5#    Hip Abduction  Both;2 sets;10 reps    Abduction Limitations  2.5#    Hip Extension  Both;2 sets;10 reps    Extension Limitations  2.5#               PT Short Term Goals - 11/12/18 1345      PT SHORT TERM GOAL #1   Status  Achieved        PT Long Term Goals - 12/08/18 1700      PT LONG TERM GOAL #1   Title  decrease pain 50%    Status  On-going      PT LONG TERM GOAL #2   Title  get up from sitting without pain and without hands            Plan - 12/08/18 1657    Clinical Impression Statement  Patient stopped coming in about two weeks ago, she reports that something caused pain in the back, she saw a neurologist and will be having an MRI in September, she reports that she cannot do arm or back exercises only do leg exercises, she walks very slowly, forward flexed trunk, antalgic on the left, moves almost a lethargic way, she does report that she fell asleep at 3PM and slept until 6:20AM this weekend.  She did well with HHA with me pulling her along and trying to get her to take more functional steps    PT Next Visit Plan  no arm or back exercises, work on her gait speed and step length and posture    Consulted and Agree with Plan of Care  Patient       Patient will benefit from skilled therapeutic intervention in order to improve the following deficits and impairments:  Abnormal gait, Pain, Postural dysfunction, Increased muscle spasms, Decreased scar mobility, Decreased mobility, Cardiopulmonary status limiting activity, Decreased activity tolerance, Decreased endurance, Decreased range of motion, Decreased strength, Decreased balance, Difficulty walking, Impaired  flexibility  Visit Diagnosis: 1. Difficulty in walking, not elsewhere classified   2. Stiffness of left hip, not elsewhere classified  3. Muscle weakness (generalized)        Problem List Patient Active Problem List   Diagnosis Date Noted  . Fracture, proximal femur ([place]) 06/12/2018  . Femur fracture, left ([place]) 06/12/2018  . Acute blood loss as cause of postoperative anemia 03/30/2017  . Diabetic polyneuropathy associated with secondary diabetes mellitus ([place]) 03/30/2017  . Asthma 03/30/2017  . Dyslipidemia 03/26/2017  . Lumbar stenosis with neurogenic claudication 03/17/2017  . Spinal stenosis of lumbar region 03/03/2017  . Myoclonic jerking   . Nausea   . Acute encephalopathy 07/15/2016  . Lumbar radiculopathy 06/27/2016  . Hypothyroidism 06/26/2016  . Labile blood glucose   . Surgery, elective   . Post-operative pain   . Sickle cell trait ([place])   . Acute blood loss anemia   . History of back surgery   . AKI (acute kidney injury) ([place])   . Herniated nucleus pulposus, L2-3 06/25/2016  . Bilateral primary osteoarthritis of knee 03/26/2016  . Osteoarthritis, hand 03/26/2016  . Other insomnia 03/26/2016  . Memory disorder 02/16/2016  . Fibromyalgia 09/27/2014  . Nocturnal leg cramps 09/27/2014  . Body mass index (BMI) of 30.0-30.9 in adult 08/04/2014  . Neuropathy 03/10/2014  . Allergic rhinitis 03/10/2014  . Osteoporosis 03/10/2014  . Spinal stenosis of lumbar region with radiculopathy 02/28/2014  . Type 2 diabetes mellitus with complication, with long-term current use of insulin ([place]) 07/08/2013  . Hypokalemia 11/02/2012  . Essential hypertension, benign 11/02/2012  . Diabetes mellitus with neuropathy ([place]) 10/29/2012  . Hyperlipidemia 10/29/2012  . Spondylolisthesis of lumbar region 03/19/2011  . Lumbar radicular pain 03/19/2011    Sumner Boast., PT 12/08/2018, 5:01 PM  [place] [address] [facility] [place], [place], [address] Phone: [phone]   Fax:  [phone]  Name: BLANKA ROCKHOLT MRN: [id number] Date of Birth: 07/27/1940

## 2018-12-10 ENCOUNTER — Ambulatory Visit: Payer: Medicare Other | Admitting: Physical Therapy

## 2018-12-14 ENCOUNTER — Encounter: Payer: Self-pay | Admitting: Physical Therapy

## 2018-12-14 ENCOUNTER — Ambulatory Visit: Payer: Medicare Other | Admitting: Physical Therapy

## 2018-12-14 ENCOUNTER — Other Ambulatory Visit: Payer: Self-pay

## 2018-12-14 DIAGNOSIS — M25552 Pain in left hip: Secondary | ICD-10-CM | POA: Diagnosis not present

## 2018-12-14 DIAGNOSIS — R262 Difficulty in walking, not elsewhere classified: Secondary | ICD-10-CM | POA: Diagnosis not present

## 2018-12-14 DIAGNOSIS — M6281 Muscle weakness (generalized): Secondary | ICD-10-CM | POA: Diagnosis not present

## 2018-12-14 DIAGNOSIS — M545 Low back pain, unspecified: Secondary | ICD-10-CM

## 2018-12-14 DIAGNOSIS — M25652 Stiffness of left hip, not elsewhere classified: Secondary | ICD-10-CM

## 2018-12-14 NOTE — Therapy (Signed)
[facility], [place], [address] Phone: [phone]   Fax:  [phone]  Physical Therapy Treatment  Patient Details  Name: [name] MRN: [id number] Date of Birth: November 22, 1940 Referring Provider (PT): Ninfa Linden   Encounter Date: 12/14/2018  PT End of Session - 12/14/18 [id number]    Visit Number  9    Date for PT Re-Evaluation  01/03/19    PT Start Time  1430    PT Stop Time  [id number]    PT Time Calculation (min)  45 min    Activity Tolerance  Patient tolerated treatment well;No increased pain;Patient limited by lethargy    Behavior During Therapy  [facility] for tasks assessed/performed       Past Medical History:  Diagnosis Date  . Acute blood loss anemia   . Acute encephalopathy 07/15/2016  . AKI (acute kidney injury) ([place])   . Anemia    has sickle cell trait  . Anxiety   . Arthritis   . Asthma    has used inhaler in past for asthmatic bronchitis, last time- early 2012  . Bilateral primary osteoarthritis of knee 03/26/2016  . Complication of anesthesia    wakes up shaking  . Diabetes mellitus   . Diabetes mellitus with neuropathy ([place]) 10/29/2012  . Diverticulitis   . Dyslipidemia   . Encephalopathy 06/2016   due to medications after surgery  . Fibromyalgia   . GERD (gastroesophageal reflux disease)    occas. use of  Prilosec  . Heart murmur    sees Dr. Montez Morita, last seen- early 2012  . Herniated nucleus pulposus, L2-3 06/25/2016  . History of back surgery   . Hypertension    02/2010- stress test /w PCP  . Hypothyroidism   . Loose bowel movements 12/2016  . Lumbar radiculopathy 06/27/2016  . Lumbar stenosis with neurogenic claudication 03/17/2017  . Memory disorder 02/16/2016  . Myoclonic jerking   . Neuromuscular disorder (H[facility])    lumbar radiculopathy, lumbago  . Nocturnal leg cramps 09/27/2014  . Osteoporosis 03/10/2014  . Pneumonia   . Sickle cell trait ([place])   . Sleep apnea    borderline sleep apnea, states she no longer uses, early 2012- stopped using   . Spinal stenosis of lumbar region with radiculopathy 02/28/2014  . Spondylolisthesis of lumbar region 03/19/2011  . Type II or unspecified type diabetes mellitus without mention of complication, uncontrolled 07/08/2013    Past Surgical History:  Procedure Laterality Date  . ABDOMINAL HYSTERECTOMY    . adb.cyst     ovarian cyst  . BACK SURGERY     2012, 2015 (3 total)  . BACK SURGERY  2018   02/24/2017  . COLONOSCOPY    . EYE SURGERY     macular degeneration treatment - injections  . FEMUR IM NAIL Left 06/13/2018   Procedure: INTRAMEDULLARY (IM) NAIL FEMORAL;  Surgeon: [name], MD;  Location: [facility];  Service: Orthopedics;  Laterality: Left;  . OVARIAN CYST SURGERY    . POSTERIOR LUMBAR FUSION 4 LEVEL N/A 03/17/2017   Procedure: Decompression of Lumbar One-Two with Thoracic Ten to Lumbar Two Fusion;  Surgeon: Kristeen Miss, MD;  Location: [facility];  Service: Neurosurgery;  Laterality: N/A;  Decompression of L1-2 with T10 to L2 Fusion    There were no vitals filed for this visit.  Subjective Assessment - 12/14/18 1433    Subjective  Pt reports that her BP has been high, and increase  fatigue. BP take before Tx BP 140/82    Pertinent History  has had 5 lumbar surgeries, osteoporosis, right knee DJD    Limitations  Lifting;Standing;Walking;House hold activities    Patient Stated Goals  walk better, walk without device    Currently in Pain?  Yes    Pain Score  7     Pain Location  Back                       [facility] Adult PT Treatment/Exercise - 12/14/18 0001      Knee/Hip Exercises: Aerobic   Nustep  level 5 x 6 minutes      Knee/Hip Exercises: Machines for Strengthening   Cybex Knee Extension  10lb 2x10, LLE 5lb x10     Cybex Knee Flexion  20lb 2x10, LLE 15lb x10     VCs for full range     Knee/Hip Exercises: Standing   Hip Flexion  Both;2 sets;10 reps    Hip Flexion  Limitations  2    Hip Abduction  Both;2 sets;10 reps    Abduction Limitations  2    Hip Extension  Both;2 sets;10 reps    Extension Limitations  2    Gait Training  Marching in place, hip abduction 2# ankle weights 2x10               PT Short Term Goals - 11/12/18 1345      PT SHORT TERM GOAL #1   Status  Achieved        PT Long Term Goals - 12/08/18 1700      PT LONG TERM GOAL #1   Title  decrease pain 50%    Status  On-going      PT LONG TERM GOAL #2   Title  get up from sitting without pain and without hands            Plan - 12/14/18 1519    Clinical Impression Statement  Pt enters therapy reporting overall concern about her medical status. She reports high blood pressure and increase fatigue and she does not know why. She reports difficulty standing in the morning after eating breakfast due to weakness and fatigue.  BP checked before treatment 140/82. She stated that her BP has been 189/102. She stated that she doe not want to continues therapy until the doctors figure out what is wrong. I explained to her the benefits of being physically active and how it could help reduce her BP. Despite all she was able to complete all of today's exercises. All standing hip interventions were completed using RW    Personal Factors and Comorbidities  Comorbidity 3+    Comorbidities  DJD right knee, 5 lumbar surgeries, osteoporosis    Stability/Clinical Decision Making  Evolving/Moderate complexity    Rehab Potential  Good    PT Frequency  3x / week    PT Duration  8 weeks    PT Treatment/Interventions  ADLs/Self Care Home Management;Electrical Stimulation;Iontophoresis 4mg /ml Dexamethasone;Moist Heat;Ultrasound;Functional mobility training;Stair training;Gait training;Therapeutic activities;Therapeutic exercise;Balance training;Neuromuscular re-education;Manual techniques;Patient/family education    PT Next Visit Plan  no arm or back exercises, work on her gait speed and step  length and posture       Patient will benefit from skilled therapeutic intervention in order to improve the following deficits and impairments:  Abnormal gait, Pain, Postural dysfunction, Increased muscle spasms, Decreased scar mobility, Decreased mobility, Cardiopulmonary status limiting activity, Decreased activity tolerance, Decreased endurance, Decreased range  of motion, Decreased strength, Decreased balance, Difficulty walking, Impaired flexibility  Visit Diagnosis: Difficulty in walking, not elsewhere classified  Stiffness of left hip, not elsewhere classified  Muscle weakness (generalized)  Acute bilateral low back pain without sciatica  Pain in left hip     Problem List Patient Active Problem List   Diagnosis Date Noted  . Fracture, proximal femur ([place]) 06/12/2018  . Femur fracture, left ([place]) 06/12/2018  . Acute blood loss as cause of postoperative anemia 03/30/2017  . Diabetic polyneuropathy associated with secondary diabetes mellitus ([place]) 03/30/2017  . Asthma 03/30/2017  . Dyslipidemia 03/26/2017  . Lumbar stenosis with neurogenic claudication 03/17/2017  . Spinal stenosis of lumbar region 03/03/2017  . Myoclonic jerking   . Nausea   . Acute encephalopathy 07/15/2016  . Lumbar radiculopathy 06/27/2016  . Hypothyroidism 06/26/2016  . Labile blood glucose   . Surgery, elective   . Post-operative pain   . Sickle cell trait ([place])   . Acute blood loss anemia   . History of back surgery   . AKI (acute kidney injury) ([place])   . Herniated nucleus pulposus, L2-3 06/25/2016  . Bilateral primary osteoarthritis of knee 03/26/2016  . Osteoarthritis, hand 03/26/2016  . Other insomnia 03/26/2016  . Memory disorder 02/16/2016  . Fibromyalgia 09/27/2014  . Nocturnal leg cramps 09/27/2014  . Body mass index (BMI) of 30.0-30.9 in adult 08/04/2014  . Neuropathy 03/10/2014  . Allergic rhinitis 03/10/2014  . Osteoporosis 03/10/2014  . Spinal stenosis of lumbar region with  radiculopathy 02/28/2014  . Type 2 diabetes mellitus with complication, with long-term current use of insulin ([place]) 07/08/2013  . Hypokalemia 11/02/2012  . Essential hypertension, benign 11/02/2012  . Diabetes mellitus with neuropathy ([place]) 10/29/2012  . Hyperlipidemia 10/29/2012  . Spondylolisthesis of lumbar region 03/19/2011  . Lumbar radicular pain 03/19/2011    Scot Jun 12/14/2018, 3:36 PM  [facility] [place], [place], [address] Phone: [phone]   Fax:  [phone]  Name: [name] MRN: [id number] Date of Birth: 02/24/1941

## 2018-12-20 ENCOUNTER — Other Ambulatory Visit: Payer: Self-pay | Admitting: Endocrinology

## 2018-12-21 ENCOUNTER — Other Ambulatory Visit: Payer: Self-pay

## 2018-12-21 MED ORDER — NOVOLOG FLEXPEN 100 UNIT/ML ~~LOC~~ SOPN: PEN_INJECTOR | SUBCUTANEOUS | 2 refills | Status: DC

## 2018-12-24 ENCOUNTER — Ambulatory Visit (INDEPENDENT_AMBULATORY_CARE_PROVIDER_SITE_OTHER): Payer: Medicare Other | Admitting: Orthopaedic Surgery

## 2018-12-24 ENCOUNTER — Ambulatory Visit (INDEPENDENT_AMBULATORY_CARE_PROVIDER_SITE_OTHER): Payer: Medicare Other

## 2018-12-24 ENCOUNTER — Other Ambulatory Visit: Payer: Self-pay

## 2018-12-24 ENCOUNTER — Encounter: Payer: Self-pay | Admitting: Orthopaedic Surgery

## 2018-12-24 DIAGNOSIS — S72002D Fracture of unspecified part of neck of left femur, subsequent encounter for closed fracture with routine healing: Secondary | ICD-10-CM

## 2018-12-24 NOTE — Progress Notes (Signed)
The patient is well-known to me.  She is a very pleasant and active 78 year old female who sustained a left femur shaft fracture after mechanical fall in late February of this year.  We had to place an intramedullary rod down the left femur.  She has been working on recovery and hip and leg strengthening since then on the left side.  She does have significant arthritis in her right knee.  She has been working on her balance and coordination.  She is also been having headaches and high blood pressure recently.  Apparently she is having an MRI of her head soon.  She says her left thigh does not hurt the way it used to hurt and she is really doing well from that side overall.  On examination of her left thigh there is no pain when I stress it.  She has excellent range of motion of her left hip and left knee without any difficulties.  X-rays are shared with her today of her left femur and compared to previous films.  She is almost completely consolidated fracture at this point and there is abundant callus formation.  This appears to have healed clinically and radiographically.  At this point follow-up for the left femur can be as needed.  She is a patient of rheumatology and is scheduled for a hyaluronic acid injection I believe in the near future on her right knee.  If that right knee becomes problematic at all she is welcome to come see [place].  All question concerns were answered addressed.  Follow-up is otherwise as needed.

## 2018-12-29 ENCOUNTER — Ambulatory Visit (INDEPENDENT_AMBULATORY_CARE_PROVIDER_SITE_OTHER): Payer: Medicare Other | Admitting: Rheumatology

## 2018-12-29 ENCOUNTER — Other Ambulatory Visit: Payer: Self-pay

## 2018-12-29 DIAGNOSIS — M1711 Unilateral primary osteoarthritis, right knee: Secondary | ICD-10-CM | POA: Diagnosis not present

## 2018-12-29 MED ORDER — LIDOCAINE HCL 1 % IJ SOLN
1.5000 mL | INTRAMUSCULAR | Status: AC | PRN
  Administered 2018-12-29: 1.5 mL

## 2018-12-29 MED ORDER — HYALURONAN 30 MG/2ML IX SOSY
30.0000 mg | PREFILLED_SYRINGE | INTRA_ARTICULAR | Status: AC | PRN
  Administered 2018-12-29: 30 mg via INTRA_ARTICULAR

## 2018-12-29 NOTE — Progress Notes (Signed)
   Procedure Note  Patient: [name]             Date of Birth: [birth year]           MRN: [id number]             Visit Date: 12/29/2018  Procedures: Visit Diagnoses:  1. Primary osteoarthritis of right knee    Orthovisc #1 right knee B/B Large Joint Inj: R knee on 12/29/2018 8:18 AM Indications: pain Details: 25 G 1.5 in needle, medial approach  Arthrogram: No  Medications: 30 mg Hyaluronan 30 MG/2ML; 1.5 mL lidocaine 1 % Aspirate: 0 mL Outcome: tolerated well, no immediate complications Procedure, treatment alternatives, risks and benefits explained, specific risks discussed. Consent was given by the patient. Immediately prior to procedure a time out was called to verify the correct patient, procedure, equipment, support staff and site/side marked as required. Patient was prepped and draped in the usual sterile fashion.    This patient is diagnosed with osteoarthritis of the knee(s).    Radiographs show evidence of joint space narrowing, osteophytes, subchondral sclerosis and/or subchondral cysts.  This patient has knee pain which interferes with functional and activities of daily living.    This patient has experienced inadequate response, adverse effects and/or intolerance with conservative treatments such as acetaminophen, NSAIDS, topical creams, physical therapy or regular exercise, knee bracing and/or weight loss.   This patient has experienced inadequate response or has a contraindication to intra articular steroid injections for at least 3 months.   This patient is not scheduled to have a total knee replacement within 6 months of starting treatment with viscosupplementation.  Bo Merino, MD

## 2019-01-05 ENCOUNTER — Other Ambulatory Visit: Payer: Self-pay

## 2019-01-05 ENCOUNTER — Ambulatory Visit
Admission: RE | Admit: 2019-01-05 | Discharge: 2019-01-05 | Disposition: A | Discharge status: Home / Self Care | Payer: Medicare Other | Source: Ambulatory Visit | Attending: Neurological Surgery | Admitting: Neurological Surgery

## 2019-01-05 ENCOUNTER — Ambulatory Visit (INDEPENDENT_AMBULATORY_CARE_PROVIDER_SITE_OTHER): Payer: Medicare Other | Admitting: Rheumatology

## 2019-01-05 DIAGNOSIS — M50223 Other cervical disc displacement at C6-C7 level: Secondary | ICD-10-CM | POA: Diagnosis not present

## 2019-01-05 DIAGNOSIS — M1711 Unilateral primary osteoarthritis, right knee: Secondary | ICD-10-CM

## 2019-01-05 DIAGNOSIS — M4712 Other spondylosis with myelopathy, cervical region: Secondary | ICD-10-CM

## 2019-01-05 NOTE — Progress Notes (Signed)
   Procedure Note  Patient: [name]             Date of Birth: 1940/12/16           MRN: [id number]             Visit Date: 01/05/2019  Procedures: Visit Diagnoses:  1. Primary osteoarthritis of right knee    Orthovisc #2 Right knee joint injection  Large Joint Inj: R knee on 01/05/2019 12:28 PM Indications: pain Details: 27 G 1.5 in needle, medial approach  Arthrogram: No  Medications: 1.5 mL lidocaine 1 %; 30 mg Hyaluronan 30 MG/2ML Aspirate: 0 mL Outcome: tolerated well, no immediate complications Procedure, treatment alternatives, risks and benefits explained, specific risks discussed. Consent was given by the patient. Immediately prior to procedure a time out was called to verify the correct patient, procedure, equipment, support staff and site/side marked as required. Patient was prepped and draped in the usual sterile fashion.     I examined and evaluated the patient with Hazel Sams PA. The plan of care was discussed as noted above.  Bo Merino, MD

## 2019-01-06 DIAGNOSIS — M5020 Other cervical disc displacement, unspecified cervical region: Secondary | ICD-10-CM | POA: Diagnosis not present

## 2019-01-08 ENCOUNTER — Other Ambulatory Visit: Payer: Medicare Other

## 2019-01-12 ENCOUNTER — Ambulatory Visit (INDEPENDENT_AMBULATORY_CARE_PROVIDER_SITE_OTHER): Payer: Medicare Other | Admitting: Rheumatology

## 2019-01-12 ENCOUNTER — Other Ambulatory Visit: Payer: Self-pay

## 2019-01-12 DIAGNOSIS — M1711 Unilateral primary osteoarthritis, right knee: Secondary | ICD-10-CM

## 2019-01-12 MED ORDER — HYALURONAN 30 MG/2ML IX SOSY
30.0000 mg | PREFILLED_SYRINGE | INTRA_ARTICULAR | Status: AC | PRN
  Administered 2019-01-12: 30 mg via INTRA_ARTICULAR

## 2019-01-12 MED ORDER — LIDOCAINE HCL 1 % IJ SOLN
1.5000 mL | INTRAMUSCULAR | Status: AC | PRN
  Administered 2019-01-12: 1.5 mL

## 2019-01-12 NOTE — Progress Notes (Signed)
   Procedure Note  Patient: [name]             Date of Birth: 11-18-1940           MRN: [id number]             Visit Date: 01/12/2019  Procedures: Visit Diagnoses:  1. Primary osteoarthritis of right knee    Orthovisc #3 Right knee joint injection B/B  Large Joint Inj: R knee on 01/12/2019 8:37 AM Indications: pain Details: 25 G 1.5 in needle, medial approach  Arthrogram: No  Medications: 30 mg Hyaluronan 30 MG/2ML; 1.5 mL lidocaine 1 % Aspirate: 0 mL Outcome: tolerated well, no immediate complications Procedure, treatment alternatives, risks and benefits explained, specific risks discussed. Consent was given by the patient. Immediately prior to procedure a time out was called to verify the correct patient, procedure, equipment, support staff and site/side marked as required. Patient was prepped and draped in the usual sterile fashion.     Bo Merino, MD

## 2019-01-14 ENCOUNTER — Other Ambulatory Visit: Payer: Self-pay | Admitting: Endocrinology

## 2019-01-18 ENCOUNTER — Other Ambulatory Visit: Payer: Self-pay

## 2019-01-18 ENCOUNTER — Encounter: Payer: Self-pay | Admitting: Adult Health

## 2019-01-18 ENCOUNTER — Ambulatory Visit (INDEPENDENT_AMBULATORY_CARE_PROVIDER_SITE_OTHER): Payer: Medicare Other | Admitting: Adult Health

## 2019-01-18 VITALS — BP 119/74 | HR 83 | Temp 97.7°F | Ht 65.0 in | Wt 177.0 lb

## 2019-01-18 DIAGNOSIS — M797 Fibromyalgia: Secondary | ICD-10-CM | POA: Diagnosis not present

## 2019-01-18 NOTE — Patient Instructions (Signed)
Your Plan:  Continue to monitor symptoms Will call about gabapentin dosing If your symptoms worsen or you develop new symptoms please let us know.    Thank you for coming to see [place] at [facility]. I hope we have been able to provide you high quality care today.  You may receive a patient satisfaction survey over the next few weeks. We would appreciate your feedback and comments so that we may continue to improve ourselves and the health of our patients.

## 2019-01-18 NOTE — Progress Notes (Signed)
PATIENT: [name] DOB: [birth year]  REASON FOR VISIT: follow up HISTORY FROM: patient  HISTORY OF PRESENT ILLNESS: Today 01/18/19:  [name] is a 78 year old female with a history of fibromyalgia.  She returns today for follow-up.  She states overall she is doing well.  She does have some questions about her gabapentin.  She reports that her primary care has been giving her 300 mg despite Dr. Tobey Grim recommendation of 600 mg.  Although it appears that our office refill the 600 mg tablet in June.  We will call her pharmacy to verify her dosing.  The patient states that she primarily has discomfort at night.  She states that she also has been having right shoulder pain and she is seeing Dr. Ellene Route.  She has been sent to physical therapy.  Overall she feels that gabapentin is helpful.  HISTORY [name] is a 78 year old right-handed black female with a history of prior lumbosacral spine surgery, she continues to have low back pain and bilateral leg discomfort that is worse with standing and walking.  The patient claims she has burning pain in the muscle of her thighs and legs at all times.  She is followed by Dr. Estanislado Pandy for her fibromyalgia.  The patient has nocturnal leg cramps, she was not getting benefit from baclofen previously, she takes magnesium supplementation and potassium without complete benefit.  The patient claims that her headaches are not currently causing her problems, she may get an occasional headache.  She has not had any falls last seen, she uses a cane for ambulation.  She has been through physical therapy on 2 occasions with some benefit.  She returns to this office for an evaluation.  The patient remains on gabapentin taking 300 mg twice during the day and 600 mg at night, she tolerates this dose well.  REVIEW OF SYSTEMS: Out of a complete 14 system review of symptoms, the patient complains only of the following symptoms, and all other reviewed systems are negative.   See HPI  ALLERGIES: Allergies  Allergen Reactions  . Cymbalta [Duloxetine Hcl] Diarrhea and Other (See Comments)    Dizziness, headache, irritability  . Baclofen     jerks   . Betadine [Povidone Iodine] Swelling and Other (See Comments)    SWELLING REACTION DESCRIPTION/SEVERITY UNSPECIFIED  Reaction to betadine eye drops  . Adhesive [Tape] Rash  . Lipitor [Atorvastatin] Rash    HOME MEDICATIONS: Outpatient Medications Prior to Visit  Medication Sig Dispense Refill  . acetaminophen (TYLENOL) 500 MG tablet Take 1,000 mg by mouth every 8 (eight) hours as needed for mild pain or moderate pain.     . cetirizine (ZYRTEC) 10 MG tablet Take 1 tablet (10 mg total) by mouth daily as needed for allergies. 30 tablet 0  . FARXIGA 5 MG TABS tablet TAKE 1 TABLET BY MOUTH DAILY 30 tablet 2  . fluticasone (FLONASE) 50 MCG/ACT nasal spray Place 1 spray into both nostrils daily as needed for allergies. 0.003 g 0  . gabapentin (NEURONTIN) 600 MG tablet TAKE 1 TABLET(600 MG) BY MOUTH THREE TIMES DAILY 270 tablet 1  . glucose blood (ONE TOUCH ULTRA TEST) test strip Use as instructed to check blood sugar 4 times daily. DX:E11.65 150 each 3  . ibuprofen (ADVIL) 800 MG tablet Take 1 tablet (800 mg total) by mouth every 8 (eight) hours as needed. 90 tablet 3  . insulin aspart (NOVOLOG FLEXPEN) 100 UNIT/ML FlexPen Inject 4 units under the skin before breakfast, 9  units at lunch, and 7 units at dinner. 15 mL 2  . Insulin Pen Needle (BD PEN NEEDLE NANO U/F) 32G X 4 MM MISC USE AS DIRECTED 100 each 0  . irbesartan (AVAPRO) 300 MG tablet TK 1 T PO QD    . levothyroxine (SYNTHROID, LEVOTHROID) 50 MCG tablet Take 1 tablet (50 mcg total) by mouth daily. 30 tablet 0  . MAGNESIUM PO Take 250 mg by mouth 2 (two) times daily.     . meloxicam (MOBIC) 15 MG tablet TK 1 T PO QD PRN    . SYMBICORT 80-4.5 MCG/ACT inhaler Inhale 2 puffs into the lungs 2 (two) times daily as needed (shortness of breath). 1 Inhaler 0  .  TRESIBA FLEXTOUCH 100 UNIT/ML SOPN FlexTouch Pen INJECT 24 UNITS UNDER THE SKIN EVERY DAY 27 mL 3  . amLODipine (NORVASC) 10 MG tablet Take 1 tablet (10 mg total) by mouth daily. (Patient not taking: Reported on 01/18/2019) 30 tablet 0  . HYDROcodone-acetaminophen (NORCO/VICODIN) 5-325 MG tablet Take 1 tablet by mouth every 6 (six) hours as needed for moderate pain. (Patient not taking: Reported on 01/18/2019) 30 tablet 0   No facility-administered medications prior to visit.     PAST MEDICAL HISTORY: Past Medical History:  Diagnosis Date  . Acute blood loss anemia   . Acute encephalopathy 07/15/2016  . AKI (acute kidney injury) ([place])   . Anemia    has sickle cell trait  . Anxiety   . Arthritis   . Asthma    has used inhaler in past for asthmatic bronchitis, last time- early 2012  . Bilateral primary osteoarthritis of knee 03/26/2016  . Complication of anesthesia    wakes up shaking  . Diabetes mellitus   . Diabetes mellitus with neuropathy ([place]) 10/29/2012  . Diverticulitis   . Dyslipidemia   . Encephalopathy 06/2016   due to medications after surgery  . Fibromyalgia   . GERD (gastroesophageal reflux disease)    occas. use of  Prilosec  . Heart murmur    sees Dr. Montez Morita, last seen- early 2012  . Herniated nucleus pulposus, L2-3 06/25/2016  . History of back surgery   . Hypertension    02/2010- stress test /w PCP  . Hypothyroidism   . Loose bowel movements 12/2016  . Lumbar radiculopathy 06/27/2016  . Lumbar stenosis with neurogenic claudication 03/17/2017  . Memory disorder 02/16/2016  . Myoclonic jerking   . Neuromuscular disorder (HCC)    lumbar radiculopathy, lumbago  . Nocturnal leg cramps 09/27/2014  . Osteoporosis 03/10/2014  . Pneumonia   . Sickle cell trait ([place])   . Sleep apnea    borderline sleep apnea, states she no longer uses, early 2012- stopped using   . Spinal stenosis of lumbar region with radiculopathy 02/28/2014  . Spondylolisthesis of lumbar region  03/19/2011  . Type II or unspecified type diabetes mellitus without mention of complication, uncontrolled 07/08/2013    PAST SURGICAL HISTORY: Past Surgical History:  Procedure Laterality Date  . ABDOMINAL HYSTERECTOMY    . adb.cyst     ovarian cyst  . BACK SURGERY     2012, 2015 (3 total)  . BACK SURGERY  2018   02/24/2017  . COLONOSCOPY    . EYE SURGERY     macular degeneration treatment - injections  . FEMUR IM NAIL Left 06/13/2018   Procedure: INTRAMEDULLARY (IM) NAIL FEMORAL;  Surgeon: [name], MD;  Location: [facility];  Service: Orthopedics;  Laterality: Left;  . OVARIAN  CYST SURGERY    . POSTERIOR LUMBAR FUSION 4 LEVEL N/A 03/17/2017   Procedure: Decompression of Lumbar One-Two with Thoracic Ten to Lumbar Two Fusion;  Surgeon: Kristeen Miss, MD;  Location: [facility];  Service: Neurosurgery;  Laterality: N/A;  Decompression of L1-2 with T10 to L2 Fusion    FAMILY HISTORY: Family History  Problem Relation Age of Onset  . Ovarian cancer Mother   . Cancer - Prostate Father   . Breast cancer Paternal Aunt   . Multiple myeloma Paternal Aunt   . Anesthesia problems Neg Hx   . Hypotension Neg Hx   . Malignant hyperthermia Neg Hx   . Pseudochol deficiency Neg Hx     SOCIAL HISTORY: Social History   Socioeconomic History  . Marital status: Divorced    Spouse name: Not on file  . Number of children: 1  . Years of education: Designer, jewellery  . Highest education level: Not on file  Occupational History  . Occupation: Retired  Scientific laboratory technician  . Financial resource strain: Not on file  . Food insecurity    Worry: Not on file    Inability: Not on file  . Transportation needs    Medical: Not on file    Non-medical: Not on file  Tobacco Use  . Smoking status: Former Smoker    Packs/day: 0.10    Years: 30.00    Pack years: 3.00    Types: Cigarettes    Quit date: 2005    Years since quitting: 15.7  . Smokeless tobacco: Never Used  Substance and Sexual Activity  .  Alcohol use: Yes    Comment: wine /w dinner on occas.  . Drug use: Never  . Sexual activity: Never  Lifestyle  . Physical activity    Days per week: Not on file    Minutes per session: Not on file  . Stress: Not on file  Relationships  . Social Herbalist on phone: Not on file    Gets together: Not on file    Attends religious service: Not on file    Active member of club or organization: Not on file    Attends meetings of clubs or organizations: Not on file    Relationship status: Not on file  . Intimate partner violence    Fear of current or ex partner: Not on file    Emotionally abused: Not on file    Physically abused: Not on file    Forced sexual activity: Not on file  Other Topics Concern  . Not on file  Social History Narrative   Patient drinks caffeine occasionally.   Patient is right handed.   Ad[facility]/21/[facility]   Divorced    Former smoker - stopped 2000    Alcohol - occasionally wine at dinner   Full code      PHYSICAL EXAM  Vitals:   01/18/19 1338  BP: 119/74  Pulse: 83  Temp: 97.7 F (36.5 C)  TempSrc: Oral  Weight: 177 lb (80.3 kg)  Height: '5\' 5"'  (1.651 m)   Body mass index is 29.45 kg/m.  Generalized: Well developed, in no acute distress   Neurological examination  Mentation: Alert oriented to time, place, history taking. Follows all commands speech and language fluent Cranial nerve II-XII: Pupils were equal round reactive to light. Extraocular movements were full, visual field were full on confrontational test.  Head turning and shoulder shrug  were normal and symmetric. Motor: The  motor testing reveals 5 over 5 strength of all 4 extremities. Good symmetric motor tone is noted throughout.  Sensory: Sensory testing is intact to soft touch on all 4 extremities. No evidence of extinction is noted.  Coordination: Cerebellar testing reveals good finger-nose-finger and heel-to-shin bilaterally.  Gait and  station: Gait is normal.   Reflexes: Deep tendon reflexes are symmetric and normal bilaterally.   DIAGNOSTIC DATA (LABS, IMAGING, TESTING) - I reviewed patient records, labs, notes, testing and imaging myself where available.  Lab Results  Component Value Date   WBC 10.0 06/22/2018   HGB 8.9 (A) 06/22/2018   HCT 27 (A) 06/22/2018   MCV 86.0 06/15/2018   PLT 320 06/22/2018      Component Value Date/Time   NA 140 10/14/2018 1041   NA 140 06/22/2018   K 3.8 10/14/2018 1041   CL 104 10/14/2018 1041   CO2 30 10/14/2018 1041   GLUCOSE 95 10/14/2018 1041   BUN 14 10/14/2018 1041   BUN 14 06/22/2018   CREATININE 0.83 10/14/2018 1041   CREATININE 0.91 07/05/2013 1014   CALCIUM 9.3 10/14/2018 1041   PROT 6.9 10/14/2018 1041   ALBUMIN 4.1 10/14/2018 1041   AST 14 10/14/2018 1041   ALT 10 10/14/2018 1041   ALKPHOS 116 10/14/2018 1041   BILITOT 0.5 10/14/2018 1041   GFRNONAA 44 (L) 06/15/2018 0513   GFRAA 51 (L) 06/15/2018 0513   Lab Results  Component Value Date   CHOL 250 (H) 10/14/2018   HDL 51.40 10/14/2018   LDLCALC 169 (H) 10/14/2018   LDLDIRECT 115.0 12/18/2016   TRIG 147.0 10/14/2018   CHOLHDL 5 10/14/2018   Lab Results  Component Value Date   HGBA1C 6.4 10/14/2018   Lab Results  Component Value Date   VITAMINB12 1,280 (H) 02/16/2016   Lab Results  Component Value Date   TSH 4.85 (H) 10/14/2018      ASSESSMENT AND PLAN 78 y.o. year old female  has a past medical history of Acute blood loss anemia, Acute encephalopathy (07/15/2016), AKI (acute kidney injury) ([place]), Anemia, Anxiety, Arthritis, Asthma, Bilateral primary osteoarthritis of knee (10/27/6752), Complication of anesthesia, Diabetes mellitus, Diabetes mellitus with neuropathy ([place]) (10/29/2012), Diverticulitis, Dyslipidemia, Encephalopathy (06/2016), Fibromyalgia, GERD (gastroesophageal reflux disease), Heart murmur, Herniated nucleus pulposus, L2-3 (06/25/2016), History of back surgery, Hypertension,  Hypothyroidism, Loose bowel movements (12/2016), Lumbar radiculopathy (06/27/2016), Lumbar stenosis with neurogenic claudication (03/17/2017), Memory disorder (02/16/2016), Myoclonic jerking, Neuromuscular disorder ([place]), Nocturnal leg cramps (09/27/2014), Osteoporosis (03/10/2014), Pneumonia, Sickle cell trait ([place]), Sleep apnea, Spinal stenosis of lumbar region with radiculopathy (02/28/2014), Spondylolisthesis of lumbar region (03/19/2011), and Type II or unspecified type diabetes mellitus without mention of complication, uncontrolled (07/08/2013). here with:  1.  Fibromyalgia  Overall patient is doing well.  We called the pharmacy and she is taking gabapentin 600 mg 3 times a day.  She will remain on this dose.  I have advised that if her symptoms worsen or she develops new symptoms she should let us know.  Since her symptoms have remained relatively stable.  She can follow-up with her PCP as long as he is amenable with prescribing gabapentin.   I spent 15 minutes with the patient. 50% of this time was spent discussing her medication   Ward Givens, MSN, NP-C 01/18/2019, 1:42 PM [facility] [address] [place], [place] [address] [phone]

## 2019-01-19 ENCOUNTER — Ambulatory Visit: Payer: Medicare Other | Admitting: Physical Therapy

## 2019-01-19 NOTE — Progress Notes (Signed)
I have read the note, and I agree with the clinical assessment and plan.  Mathan Darroch K Sheran Newstrom   

## 2019-01-20 ENCOUNTER — Encounter

## 2019-01-20 ENCOUNTER — Ambulatory Visit: Payer: Medicare Other | Attending: Orthopaedic Surgery | Admitting: Physical Therapy

## 2019-01-20 ENCOUNTER — Encounter: Payer: Self-pay | Admitting: Physical Therapy

## 2019-01-20 ENCOUNTER — Other Ambulatory Visit: Payer: Self-pay

## 2019-01-20 DIAGNOSIS — R252 Cramp and spasm: Secondary | ICD-10-CM | POA: Insufficient documentation

## 2019-01-20 DIAGNOSIS — M542 Cervicalgia: Secondary | ICD-10-CM | POA: Diagnosis not present

## 2019-01-20 DIAGNOSIS — M5412 Radiculopathy, cervical region: Secondary | ICD-10-CM | POA: Diagnosis not present

## 2019-01-20 NOTE — Therapy (Signed)
[facility] [address] [place], [place], [address] Phone: [phone]   Fax:  [phone]  Physical Therapy Evaluation  Patient Details  Name: [name] MRN: [id number] Date of Birth: Dec 08, 1940 Referring Provider (PT): Elsner   Encounter Date: 01/20/2019  PT End of Session - 01/20/19 1421    Visit Number  1    Date for PT Re-Evaluation  03/22/19    Authorization Type  Medicare    PT Start Time  1350    PT Stop Time  1437    PT Time Calculation (min)  47 min    Activity Tolerance  Patient tolerated treatment well;Patient limited by pain    Behavior During Therapy  [facility] for tasks assessed/performed;Flat affect       Past Medical History:  Diagnosis Date  . Acute blood loss anemia   . Acute encephalopathy 07/15/2016  . AKI (acute kidney injury) ([place])   . Anemia    has sickle cell trait  . Anxiety   . Arthritis   . Asthma    has used inhaler in past for asthmatic bronchitis, last time- early 2012  . Bilateral primary osteoarthritis of knee 03/26/2016  . Complication of anesthesia    wakes up shaking  . Diabetes mellitus   . Diabetes mellitus with neuropathy ([place]) 10/29/2012  . Diverticulitis   . Dyslipidemia   . Encephalopathy 06/2016   due to medications after surgery  . Fibromyalgia   . GERD (gastroesophageal reflux disease)    occas. use of  Prilosec  . Heart murmur    sees Dr. Montez Morita, last seen- early 2012  . Herniated nucleus pulposus, L2-3 06/25/2016  . History of back surgery   . Hypertension    02/2010- stress test /w PCP  . Hypothyroidism   . Loose bowel movements 12/2016  . Lumbar radiculopathy 06/27/2016  . Lumbar stenosis with neurogenic claudication 03/17/2017  . Memory disorder 02/16/2016  . Myoclonic jerking   . Neuromuscular disorder (HCC)    lumbar radiculopathy, lumbago  . Nocturnal leg cramps 09/27/2014  . Osteoporosis 03/10/2014  . Pneumonia   . Sickle cell trait ([place])   .  Sleep apnea    borderline sleep apnea, states she no longer uses, early 2012- stopped using   . Spinal stenosis of lumbar region with radiculopathy 02/28/2014  . Spondylolisthesis of lumbar region 03/19/2011  . Type II or unspecified type diabetes mellitus without mention of complication, uncontrolled 07/08/2013    Past Surgical History:  Procedure Laterality Date  . ABDOMINAL HYSTERECTOMY    . adb.cyst     ovarian cyst  . BACK SURGERY     2012, 2015 (3 total)  . BACK SURGERY  2018   02/24/2017  . COLONOSCOPY    . EYE SURGERY     macular degeneration treatment - injections  . FEMUR IM NAIL Left 06/13/2018   Procedure: INTRAMEDULLARY (IM) NAIL FEMORAL;  Surgeon: [name], MD;  Location: [facility];  Service: Orthopedics;  Laterality: Left;  . OVARIAN CYST SURGERY    . POSTERIOR LUMBAR FUSION 4 LEVEL N/A 03/17/2017   Procedure: Decompression of Lumbar One-Two with Thoracic Ten to Lumbar Two Fusion;  Surgeon: Kristeen Miss, MD;  Location: [facility];  Service: Neurosurgery;  Laterality: N/A;  Decompression of L1-2 with T10 to L2 Fusion    There were no vitals filed for this visit.   Subjective Assessment - 01/20/19 1356    Subjective  Patient had  been seen here earlier inthe year after an IM nailing of the left hip, she reports that she started having HA, neck pain and right shoulder and arm pain in January.  She had the fall and had to have the hip surgery and ignored the neck, recent MRI showed C5-7 disc bulges and significant stenosis and degenerative changes    Pertinent History  has had 5 lumbar surgeries, osteoporosis, right knee DJD    Limitations  Lifting;Standing;Walking;House hold activities    Patient Stated Goals  have less HA's, less neck and arm pain    Currently in Pain?  Yes    Pain Score  4     Pain Location  Neck    Pain Orientation  Right    Pain Descriptors / Indicators  Aching;Spasm;Tightness    Pain Type  Acute pain    Pain Radiating Towards  c/o right  side HA, right upper trap pain, right arm pain into the hand    Pain Onset  More than a month ago    Pain Frequency  Constant    Aggravating Factors   worse with activity, turning head, reading, housework pain can be up to 10/10    Pain Relieving Factors  ice, pain medication, change posistions at best a 4/10    Effect of Pain on Daily Activities  difficulty driving, doing any housework, and wearing a bra, she has pain in the right thoracic area         [facility] PT Assessment - 01/20/19 0001      Assessment   Medical Diagnosis  Cervical HNP, DDD, stenosis    Referring Provider (PT)  Elsner    Onset Date/Surgical Date  01/06/19    Prior Therapy  for back and left hip      Precautions   Precautions  None      Balance Screen   Has the patient fallen in the past 6 months  No    Has the patient had a decrease in activity level because of a fear of falling?   Yes    Is the patient reluctant to leave their home because of a fear of falling?   No      Home Environment   Additional Comments  was doing housework, does her own shopping, has stairs at home      Prior Function   Level of Independence  Independent    Vocation  Retired    Leisure  does some exercise      AROM   Overall AROM Comments  Cervical ROM is decreased 50% with c/o pain in the Head and the arm with ROM especially right rotation, Shoulder ROM WFL's with tightness and pain      Strength   Overall Strength Comments  3+/5 for the shoulders and arms with some increased neck and right arm pain      Palpation   Palpation comment  She is very tight and sore in the right upper trap, the the right cervical area and the right rhomboid area                Objective measurements completed on examination: See above findings.      [facility] Adult PT Treatment/Exercise - 01/20/19 0001      Modalities   Modalities  Electrical Stimulation      Electrical Stimulation   Electrical Stimulation Location  neck right upper  trap and into the rhomboid    Electrical Stimulation Action  IFC  Electrical Stimulation Parameters  supine    Electrical Stimulation Goals  Pain             PT Education - 01/20/19 1421    Education Details  gave HEP for cervical and scapular retraction, shoulder shrugs    Person(s) Educated  Patient    Methods  Explanation;Demonstration;Handout;Verbal cues;Tactile cues    Comprehension  Verbalized understanding       PT Short Term Goals - 01/20/19 1426      PT SHORT TERM GOAL #1   Title  independent with initial HEP    Time  2    Period  Weeks    Status  New        PT Long Term Goals - 01/20/19 1426      PT LONG TERM GOAL #1   Title  decrease pain 50%    Time  8    Period  Weeks    Status  New      PT LONG TERM GOAL #2   Title  get up from sitting without pain and without hands    Time  8    Period  Weeks    Status  New      PT LONG TERM GOAL #3   Title  increase cervical ROM 25%    Time  8    Period  Weeks    Status  New      PT LONG TERM GOAL #4   Title  report 50% less HA    Time  8    Period  Weeks    Status  New             Plan - 01/20/19 1422    Clinical Impression Statement  Patient was being seen here [facility] for a IM hip nailing, she continued to have medical issues to the point she was very lethargic and was having BP issues, she had an MRI that showed DDD, HNP C5-7, she reports that she has been having neck pain, HA, thoracic pain and arm pain all on the right for the past few months.  She remains lethargic, she is very tender in the right upper trap and the right thoracic area.    Personal Factors and Comorbidities  Comorbidity 3+    Comorbidities  DJD right knee, 5 lumbar surgeries, osteoporosis    Stability/Clinical Decision Making  Evolving/Moderate complexity    Clinical Decision Making  Moderate    Rehab Potential  Good    PT Frequency  3x / week    PT Duration  8 weeks    PT Treatment/Interventions  ADLs/Self Care Home  Management;Electrical Stimulation;Iontophoresis 4mg /ml Dexamethasone;Moist Heat;Ultrasound;Functional mobility training;Stair training;Gait training;Therapeutic activities;Therapeutic exercise;Balance training;Neuromuscular re-education;Manual techniques;Patient/family education;Traction;Cryotherapy;Dry needling    PT Next Visit Plan  May try traction, needs to get the spasms decreased    Consulted and Agree with Plan of Care  Patient       Patient will benefit from skilled therapeutic intervention in order to improve the following deficits and impairments:  Pain, Postural dysfunction, Increased muscle spasms, Cardiopulmonary status limiting activity, Decreased activity tolerance, Decreased endurance, Decreased range of motion, Decreased strength, Decreased balance, Difficulty walking, Impaired flexibility, Improper body mechanics, Impaired UE functional use  Visit Diagnosis: Radiculopathy, cervical region - Plan: PT plan of care cert/re-cert  Cervicalgia - Plan: PT plan of care cert/re-cert  Cramp and spasm - Plan: PT plan of care cert/re-cert     Problem List Patient Active Problem List  Diagnosis Date Noted  . Fracture, proximal femur ([place]) 06/12/2018  . Femur fracture, left ([place]) 06/12/2018  . Acute blood loss as cause of postoperative anemia 03/30/2017  . Diabetic polyneuropathy associated with secondary diabetes mellitus ([place]) 03/30/2017  . Asthma 03/30/2017  . Dyslipidemia 03/26/2017  . Lumbar stenosis with neurogenic claudication 03/17/2017  . Spinal stenosis of lumbar region 03/03/2017  . Myoclonic jerking   . Nausea   . Acute encephalopathy 07/15/2016  . Lumbar radiculopathy 06/27/2016  . Hypothyroidism 06/26/2016  . Labile blood glucose   . Surgery, elective   . Post-operative pain   . Sickle cell trait ([place])   . Acute blood loss anemia   . History of back surgery   . AKI (acute kidney injury) ([place])   . Herniated nucleus pulposus, L2-3 06/25/2016  . Bilateral  primary osteoarthritis of knee 03/26/2016  . Osteoarthritis, hand 03/26/2016  . Other insomnia 03/26/2016  . Fibromyalgia 09/27/2014  . Nocturnal leg cramps 09/27/2014  . Body mass index (BMI) of 30.0-30.9 in adult 08/04/2014  . Neuropathy 03/10/2014  . Allergic rhinitis 03/10/2014  . Osteoporosis 03/10/2014  . Spinal stenosis of lumbar region with radiculopathy 02/28/2014  . Type 2 diabetes mellitus with complication, with long-term current use of insulin ([place]) 07/08/2013  . Hypokalemia 11/02/2012  . Essential hypertension, benign 11/02/2012  . Diabetes mellitus with neuropathy ([place]) 10/29/2012  . Hyperlipidemia 10/29/2012  . Spondylolisthesis of lumbar region 03/19/2011  . Lumbar radicular pain 03/19/2011    Sumner Boast., PT 01/20/2019, 2:30 PM  [place] [address] [place], [place], [address] Phone: [phone]   Fax:  [phone]  Name: [name] MRN: [id number] Date of Birth: [birth year]

## 2019-01-21 ENCOUNTER — Ambulatory Visit: Payer: Medicare Other | Attending: Orthopaedic Surgery | Admitting: Physical Therapy

## 2019-01-21 ENCOUNTER — Encounter: Payer: Self-pay | Admitting: Physical Therapy

## 2019-01-21 DIAGNOSIS — M542 Cervicalgia: Secondary | ICD-10-CM | POA: Diagnosis not present

## 2019-01-21 DIAGNOSIS — R252 Cramp and spasm: Secondary | ICD-10-CM | POA: Insufficient documentation

## 2019-01-21 DIAGNOSIS — R262 Difficulty in walking, not elsewhere classified: Secondary | ICD-10-CM | POA: Diagnosis not present

## 2019-01-21 DIAGNOSIS — M5412 Radiculopathy, cervical region: Secondary | ICD-10-CM

## 2019-01-21 NOTE — Therapy (Signed)
[place], [place], [address] Phone: [phone]   Fax:  [phone]  Physical Therapy Treatment  Patient Details  Name: RAQUELLE GISMONDI MRN: [id number] Date of Birth: 1940-11-26 Referring Provider (PT): Elsner   Encounter Date: 01/21/2019  PT End of Session - 01/21/19 1224    Visit Number  2    Date for PT Re-Evaluation  03/22/19    Authorization Type  Medicare    PT Start Time  [id number]    PT Stop Time  1238    PT Time Calculation (min)  53 min    Activity Tolerance  Patient tolerated treatment well    Behavior During Therapy  [facility] for tasks assessed/performed;Flat affect       Past Medical History:  Diagnosis Date  . Acute blood loss anemia   . Acute encephalopathy 07/15/2016  . AKI (acute kidney injury) ([facility])   . Anemia    has sickle cell trait  . Anxiety   . Arthritis   . Asthma    has used inhaler in past for asthmatic bronchitis, last time- early 2012  . Bilateral primary osteoarthritis of knee 03/26/2016  . Complication of anesthesia    wakes up shaking  . Diabetes mellitus   . Diabetes mellitus with neuropathy ([place]) 10/29/2012  . Diverticulitis   . Dyslipidemia   . Encephalopathy 06/2016   due to medications after surgery  . Fibromyalgia   . GERD (gastroesophageal reflux disease)    occas. use of  Prilosec  . Heart murmur    sees Dr. Montez Morita, last seen- early 2012  . Herniated nucleus pulposus, L2-3 06/25/2016  . History of back surgery   . Hypertension    02/2010- stress test /w PCP  . Hypothyroidism   . Loose bowel movements 12/2016  . Lumbar radiculopathy 06/27/2016  . Lumbar stenosis with neurogenic claudication 03/17/2017  . Memory disorder 02/16/2016  . Myoclonic jerking   . Neuromuscular disorder (HCC)    lumbar radiculopathy, lumbago  . Nocturnal leg cramps 09/27/2014  . Osteoporosis 03/10/2014  . Pneumonia   . Sickle cell trait ([place])   . Sleep apnea    borderline sleep apnea, states she no longer uses, early 2012- stopped using   . Spinal stenosis of lumbar region with radiculopathy 02/28/2014  . Spondylolisthesis of lumbar region 03/19/2011  . Type II or unspecified type diabetes mellitus without mention of complication, uncontrolled 07/08/2013    Past Surgical History:  Procedure Laterality Date  . ABDOMINAL HYSTERECTOMY    . adb.cyst     ovarian cyst  . BACK SURGERY     2012, 2015 (3 total)  . BACK SURGERY  2018   02/24/2017  . COLONOSCOPY    . EYE SURGERY     macular degeneration treatment - injections  . FEMUR IM NAIL Left 06/13/2018   Procedure: INTRAMEDULLARY (IM) NAIL FEMORAL;  Surgeon: [name], MD;  [facility]: [facility];  Service: Orthopedics;  Laterality: Left;  . OVARIAN CYST SURGERY    . POSTERIOR LUMBAR FUSION 4 LEVEL N/A 03/17/2017   Procedure: Decompression of Lumbar One-Two with Thoracic Ten to Lumbar Two Fusion;  Surgeon: Kristeen Miss, MD;  Location: [facility];  Service: Neurosurgery;  Laterality: N/A;  Decompression of L1-2 with T10 to L2 Fusion    There were no vitals filed for this visit.  Subjective Assessment - 01/21/19 1140    Subjective  "Fine"    Currently in Pain?  Yes    Pain Score  4     Pain Location  Neck                       [facility] Adult PT Treatment/Exercise - 01/21/19 0001      Exercises   Exercises  Neck      Neck Exercises: Seated   Neck Retraction  3 secs;20 reps;10 reps    Shoulder Rolls  Backwards;10 reps   x2   Shoulder Flexion  Both;10 reps;Weights   x2   Shoulder ABduction  Both;Weights;10 reps   x2   Other Seated Exercise  Red tband Rows 2x10; Horiz Abd yellow tband 2x10     Other Seated Exercise  Shoulder ER red 2x10       Knee/Hip Exercises: Aerobic   Nustep  level 5 x 7 minutes      Modalities   Modalities  Electrical Stimulation      Electrical Stimulation   Electrical Stimulation Location  neck right upper trap and into the rhomboid     Electrical Stimulation Action  IFC    Electrical Stimulation Parameters  supine    Electrical Stimulation Goals  Pain               PT Short Term Goals - 01/20/19 1426      PT SHORT TERM GOAL #1   Title  independent with initial HEP    Time  2    Period  Weeks    Status  New        PT Long Term Goals - 01/20/19 1426      PT LONG TERM GOAL #1   Title  decrease pain 50%    Time  8    Period  Weeks    Status  New      PT LONG TERM GOAL #2   Title  get up from sitting without pain and without hands    Time  8    Period  Weeks    Status  New      PT LONG TERM GOAL #3   Title  increase cervical ROM 25%    Time  8    Period  Weeks    Status  New      PT LONG TERM GOAL #4   Title  report 50% less HA    Time  8    Period  Weeks    Status  New            Plan - 01/21/19 1225    Clinical Impression Statement  Pt tolerated an  initial progression toe TE well. All exercises performed in the seated position. No reports of increase pain only that she could feel the exercises. Tactile cues needed for proper cervical retraction form.    Personal Factors and Comorbidities  Comorbidity 3+    Comorbidities  DJD right knee, 5 lumbar surgeries, osteoporosis    Rehab Potential  Good    PT Frequency  3x / week    PT Duration  8 weeks    PT Treatment/Interventions  ADLs/Self Care Home Management;Electrical Stimulation;Iontophoresis 4mg /ml Dexamethasone;Moist Heat;Ultrasound;Functional mobility training;Stair training;Gait training;Therapeutic activities;Therapeutic exercise;Balance training;Neuromuscular re-education;Manual techniques;Patient/family education;Traction;Cryotherapy;Dry needling    PT Next Visit Plan  May try traction, needs to get the spasms decreased       Patient will benefit from skilled therapeutic intervention in order to improve the following deficits and impairments:  Pain, Postural dysfunction, Increased muscle  spasms, Cardiopulmonary status  limiting activity, Decreased activity tolerance, Decreased endurance, Decreased range of motion, Decreased strength, Decreased balance, Difficulty walking, Impaired flexibility, Improper body mechanics, Impaired UE functional use  Visit Diagnosis: Cervicalgia  Cramp and spasm  Radiculopathy, cervical region     Problem List Patient Active Problem List   Diagnosis Date Noted  . Fracture, proximal femur ([place]) 06/12/2018  . Femur fracture, left ([place]) 06/12/2018  . Acute blood loss as cause of postoperative anemia 03/30/2017  . Diabetic polyneuropathy associated with secondary diabetes mellitus ([place]) 03/30/2017  . Asthma 03/30/2017  . Dyslipidemia 03/26/2017  . Lumbar stenosis with neurogenic claudication 03/17/2017  . Spinal stenosis of lumbar region 03/03/2017  . Myoclonic jerking   . Nausea   . Acute encephalopathy 07/15/2016  . Lumbar radiculopathy 06/27/2016  . Hypothyroidism 06/26/2016  . Labile blood glucose   . Surgery, elective   . Post-operative pain   . Sickle cell trait ([place])   . Acute blood loss anemia   . History of back surgery   . AKI (acute kidney injury) ([place])   . Herniated nucleus pulposus, L2-3 06/25/2016  . Bilateral primary osteoarthritis of knee 03/26/2016  . Osteoarthritis, hand 03/26/2016  . Other insomnia 03/26/2016  . Fibromyalgia 09/27/2014  . Nocturnal leg cramps 09/27/2014  . Body mass index (BMI) of 30.0-30.9 in adult 08/04/2014  . Neuropathy 03/10/2014  . Allergic rhinitis 03/10/2014  . Osteoporosis 03/10/2014  . Spinal stenosis of lumbar region with radiculopathy 02/28/2014  . Type 2 diabetes mellitus with complication, with long-term current use of insulin ([place]) 07/08/2013  . Hypokalemia 11/02/2012  . Essential hypertension, benign 11/02/2012  . Diabetes mellitus with neuropathy ([place]) 10/29/2012  . Hyperlipidemia 10/29/2012  . Spondylolisthesis of lumbar region 03/19/2011  . Lumbar radicular pain 03/19/2011    Scot Jun,  PTA 01/21/2019, 12:31 PM  [place], [place], [address] Phone: [phone]   Fax:  [phone]  Name: MINAHIL HULTS MRN: [id number] Date of Birth: 1940-11-24

## 2019-01-25 ENCOUNTER — Encounter: Payer: Self-pay | Admitting: Physical Therapy

## 2019-01-25 ENCOUNTER — Ambulatory Visit: Payer: Medicare Other | Admitting: Physical Therapy

## 2019-01-25 ENCOUNTER — Other Ambulatory Visit: Payer: Self-pay

## 2019-01-25 DIAGNOSIS — R252 Cramp and spasm: Secondary | ICD-10-CM

## 2019-01-25 DIAGNOSIS — M542 Cervicalgia: Secondary | ICD-10-CM | POA: Diagnosis not present

## 2019-01-25 DIAGNOSIS — M5412 Radiculopathy, cervical region: Secondary | ICD-10-CM

## 2019-01-25 DIAGNOSIS — R262 Difficulty in walking, not elsewhere classified: Secondary | ICD-10-CM | POA: Diagnosis not present

## 2019-01-25 NOTE — Therapy (Signed)
[place], [place], [address] Phone: [phone]   Fax:  [phone]  Physical Therapy Treatment  Patient Details  Name: [name] MRN: [id number] Date of Birth: 27-Apr-1940 Referring Provider (PT): Elsner   Encounter Date: 01/25/2019  PT End of Session - 01/25/19 1232    Visit Number  3    Date for PT Re-Evaluation  03/22/19    Authorization Type  Medicare    PT Start Time  2774    PT Stop Time  1239    PT Time Calculation (min)  54 min    Activity Tolerance  Patient tolerated treatment well    Behavior During Therapy  [facility] for tasks assessed/performed       Past Medical History:  Diagnosis Date  . Acute blood loss anemia   . Acute encephalopathy 07/15/2016  . AKI (acute kidney injury) ([place])   . Anemia    has sickle cell trait  . Anxiety   . Arthritis   . Asthma    has used inhaler in past for asthmatic bronchitis, last time- early 2012  . Bilateral primary osteoarthritis of knee 03/26/2016  . Complication of anesthesia    wakes up shaking  . Diabetes mellitus   . Diabetes mellitus with neuropathy ([place]) 10/29/2012  . Diverticulitis   . Dyslipidemia   . Encephalopathy 06/2016   due to medications after surgery  . Fibromyalgia   . GERD (gastroesophageal reflux disease)    occas. use of  Prilosec  . Heart murmur    sees Dr. Montez Morita, last seen- early 2012  . Herniated nucleus pulposus, L2-3 06/25/2016  . History of back surgery   . Hypertension    02/2010- stress test /w PCP  . Hypothyroidism   . Loose bowel movements 12/2016  . Lumbar radiculopathy 06/27/2016  . Lumbar stenosis with neurogenic claudication 03/17/2017  . Memory disorder 02/16/2016  . Myoclonic jerking   . Neuromuscular disorder (HCC)    lumbar radiculopathy, lumbago  . Nocturnal leg cramps 09/27/2014  . Osteoporosis 03/10/2014  . Pneumonia   . Sickle cell trait ([place])   . Sleep apnea    borderline sleep  apnea, states she no longer uses, early 2012- stopped using   . Spinal stenosis of lumbar region with radiculopathy 02/28/2014  . Spondylolisthesis of lumbar region 03/19/2011  . Type II or unspecified type diabetes mellitus without mention of complication, uncontrolled 07/08/2013    Past Surgical History:  Procedure Laterality Date  . ABDOMINAL HYSTERECTOMY    . adb.cyst     ovarian cyst  . BACK SURGERY     2012, 2015 (3 total)  . BACK SURGERY  2018   02/24/2017  . COLONOSCOPY    . EYE SURGERY     macular degeneration treatment - injections  . FEMUR IM NAIL Left 06/13/2018   Procedure: INTRAMEDULLARY (IM) NAIL FEMORAL;  Surgeon: [name], MD;  [facility];  Service: Orthopedics;  Laterality: Left;  . OVARIAN CYST SURGERY    . POSTERIOR LUMBAR FUSION 4 LEVEL N/A 03/17/2017   Procedure: Decompression of Lumbar One-Two with Thoracic Ten to Lumbar Two Fusion;  Surgeon: Kristeen Miss, MD;  Location: [facility];  Service: Neurosurgery;  Laterality: N/A;  Decompression of L1-2 with T10 to L2 Fusion    There were no vitals filed for this visit.  Subjective Assessment - 01/25/19 1151    Subjective  "Im fine, I feel like I always  feel"    Patient Stated Goals  have less HA's, less neck and arm pain    Currently in Pain?  Yes    Pain Score  5     Pain Location  Neck    Pain Orientation  Right                       [facility] Adult PT Treatment/Exercise - 01/25/19 0001      Neck Exercises: Seated   Neck Retraction  3 secs;20 reps;10 reps    Shoulder Rolls  Backwards;10 reps   x2   Shoulder Flexion  Both;10 reps;Weights    Shoulder Flexion Weights (lbs)  1    Shoulder ABduction  Both;Weights;20 reps    Shoulder Abduction Weights (lbs)  1    Other Seated Exercise  Red tband Rows 2x15; Horiz Abd yellow tband 2x10     Other Seated Exercise  Shoulder ER red 2x10       Knee/Hip Exercises: Aerobic   Nustep  level 4 x 7 minutes      Modalities   Modalities   Electrical Stimulation;Moist Heat      Electrical Stimulation   Electrical Stimulation Location  neck right upper trap and into the rhomboid    Electrical Stimulation Action  IFC    Electrical Stimulation Parameters  supine    Electrical Stimulation Goals  Pain               PT Short Term Goals - 01/20/19 1426      PT SHORT TERM GOAL #1   Title  independent with initial HEP    Time  2    Period  Weeks    Status  New        PT Long Term Goals - 01/25/19 1233      PT LONG TERM GOAL #1   Title  decrease pain 50%    Status  Partially Met      PT LONG TERM GOAL #2   Title  get up from sitting without pain and without hands    Status  Partially Met      PT LONG TERM GOAL #3   Title  increase cervical ROM 25%    Status  On-going      PT LONG TERM GOAL #4   Title  report 50% less HA    Status  Partially Met            Plan - 01/25/19 1234    Clinical Impression Statement  Pt did well with the exercises, She did report some initial pain with external rotation and abduction that went away as the reps progressed. Cues needed to maintain good posture with seated rows keeping core engaged.    Comorbidities  DJD right knee, 5 lumbar surgeries, osteoporosis    Stability/Clinical Decision Making  Evolving/Moderate complexity    Rehab Potential  Good    PT Frequency  3x / week    PT Duration  8 weeks    PT Treatment/Interventions  ADLs/Self Care Home Management;Electrical Stimulation;Iontophoresis 10m/ml Dexamethasone;Moist Heat;Ultrasound;Functional mobility training;Stair training;Gait training;Therapeutic activities;Therapeutic exercise;Balance training;Neuromuscular re-education;Manual techniques;Patient/family education;Traction;Cryotherapy;Dry needling    PT Next Visit Plan  May try traction, needs to get the spasms decreased       Patient will benefit from skilled therapeutic intervention in order to improve the following deficits and impairments:  Pain,  Postural dysfunction, Increased muscle spasms, Cardiopulmonary status limiting activity, Decreased activity tolerance, Decreased endurance, Decreased  range of motion, Decreased strength, Decreased balance, Difficulty walking, Impaired flexibility, Improper body mechanics, Impaired UE functional use  Visit Diagnosis: Cervicalgia  Cramp and spasm  Radiculopathy, cervical region     Problem List Patient Active Problem List   Diagnosis Date Noted  . Fracture, proximal femur ([place]) 06/12/2018  . Femur fracture, left ([place]) 06/12/2018  . Acute blood loss as cause of postoperative anemia 03/30/2017  . Diabetic polyneuropathy associated with secondary diabetes mellitus ([place]) 03/30/2017  . Asthma 03/30/2017  . Dyslipidemia 03/26/2017  . Lumbar stenosis with neurogenic claudication 03/17/2017  . Spinal stenosis of lumbar region 03/03/2017  . Myoclonic jerking   . Nausea   . Acute encephalopathy 07/15/2016  . Lumbar radiculopathy 06/27/2016  . Hypothyroidism 06/26/2016  . Labile blood glucose   . Surgery, elective   . Post-operative pain   . Sickle cell trait ([facility])   . Acute blood loss anemia   . History of back surgery   . AKI (acute kidney injury) ([place])   . Herniated nucleus pulposus, L2-3 06/25/2016  . Bilateral primary osteoarthritis of knee 03/26/2016  . Osteoarthritis, hand 03/26/2016  . Other insomnia 03/26/2016  . Fibromyalgia 09/27/2014  . Nocturnal leg cramps 09/27/2014  . Body mass index (BMI) of 30.0-30.9 in adult 08/04/2014  . Neuropathy 03/10/2014  . Allergic rhinitis 03/10/2014  . Osteoporosis 03/10/2014  . Spinal stenosis of lumbar region with radiculopathy 02/28/2014  . Type 2 diabetes mellitus with complication, with long-term current use of insulin ([place]) 07/08/2013  . Hypokalemia 11/02/2012  . Essential hypertension, benign 11/02/2012  . Diabetes mellitus with neuropathy ([place]) 10/29/2012  . Hyperlipidemia 10/29/2012  . Spondylolisthesis of lumbar region  03/19/2011  . Lumbar radicular pain 03/19/2011    Scot Jun 01/25/2019, 12:41 PM  [place], [place], [address] Phone: [phone]   Fax:  [phone]  Name: [name] MRN: [id number] Date of Birth: 1940/06/22

## 2019-01-27 ENCOUNTER — Encounter: Payer: Self-pay | Admitting: Physical Therapy

## 2019-01-27 ENCOUNTER — Ambulatory Visit: Payer: Medicare Other | Admitting: Physical Therapy

## 2019-01-27 ENCOUNTER — Other Ambulatory Visit: Payer: Self-pay

## 2019-01-27 DIAGNOSIS — M542 Cervicalgia: Secondary | ICD-10-CM

## 2019-01-27 DIAGNOSIS — I10 Essential (primary) hypertension: Secondary | ICD-10-CM | POA: Diagnosis not present

## 2019-01-27 DIAGNOSIS — M5412 Radiculopathy, cervical region: Secondary | ICD-10-CM

## 2019-01-27 DIAGNOSIS — R252 Cramp and spasm: Secondary | ICD-10-CM | POA: Diagnosis not present

## 2019-01-27 DIAGNOSIS — R262 Difficulty in walking, not elsewhere classified: Secondary | ICD-10-CM | POA: Diagnosis not present

## 2019-01-27 NOTE — Therapy (Signed)
[place], [place], [address] Phone: [phone]   Fax:  [phone]  Physical Therapy Treatment  Patient Details  Name: [name] MRN: [id number] Date of Birth: 12-Sep-1940 Referring Provider (PT): Elsner   Encounter Date: 01/27/2019  PT End of Session - 01/27/19 1429    Visit Number  4    Date for PT Re-Evaluation  03/22/19    Authorization Type  Medicare    PT Start Time  2585    PT Stop Time  1444    PT Time Calculation (min)  59 min    Activity Tolerance  Patient tolerated treatment well    Behavior During Therapy  [facility] for tasks assessed/performed       Past Medical History:  Diagnosis Date  . Acute blood loss anemia   . Acute encephalopathy 07/15/2016  . AKI (acute kidney injury) ([place])   . Anemia    has sickle cell trait  . Anxiety   . Arthritis   . Asthma    has used inhaler in past for asthmatic bronchitis, last time- early 2012  . Bilateral primary osteoarthritis of knee 03/26/2016  . Complication of anesthesia    wakes up shaking  . Diabetes mellitus   . Diabetes mellitus with neuropathy ([place]) 10/29/2012  . Diverticulitis   . Dyslipidemia   . Encephalopathy 06/2016   due to medications after surgery  . Fibromyalgia   . GERD (gastroesophageal reflux disease)    occas. use of  Prilosec  . Heart murmur    sees Dr. Montez Morita, last seen- early 2012  . Herniated nucleus pulposus, L2-3 06/25/2016  . History of back surgery   . Hypertension    02/2010- stress test /w PCP  . Hypothyroidism   . Loose bowel movements 12/2016  . Lumbar radiculopathy 06/27/2016  . Lumbar stenosis with neurogenic claudication 03/17/2017  . Memory disorder 02/16/2016  . Myoclonic jerking   . Neuromuscular disorder (HCC)    lumbar radiculopathy, lumbago  . Nocturnal leg cramps 09/27/2014  . Osteoporosis 03/10/2014  . Pneumonia   . Sickle cell trait ([place])   . Sleep apnea    borderline sleep  apnea, states she no longer uses, early 2012- stopped using   . Spinal stenosis of lumbar region with radiculopathy 02/28/2014  . Spondylolisthesis of lumbar region 03/19/2011  . Type II or unspecified type diabetes mellitus without mention of complication, uncontrolled 07/08/2013    Past Surgical History:  Procedure Laterality Date  . ABDOMINAL HYSTERECTOMY    . adb.cyst     ovarian cyst  . BACK SURGERY     2012, 2015 (3 total)  . BACK SURGERY  2018   02/24/2017  . COLONOSCOPY    . EYE SURGERY     macular degeneration treatment - injections  . FEMUR IM NAIL Left 06/13/2018   Procedure: INTRAMEDULLARY (IM) NAIL FEMORAL;  Surgeon: [name], MD;  [facility];  Service: Orthopedics;  Laterality: Left;  . OVARIAN CYST SURGERY    . POSTERIOR LUMBAR FUSION 4 LEVEL N/A 03/17/2017   Procedure: Decompression of Lumbar One-Two with Thoracic Ten to Lumbar Two Fusion;  Surgeon: Kristeen Miss, MD;  Location: [facility];  Service: Neurosurgery;  Laterality: N/A;  Decompression of L1-2 with T10 to L2 Fusion    There were no vitals filed for this visit.  Subjective Assessment - 01/27/19 1348    Subjective  "Im hurting a lot"  Currently in Pain?  Yes    Pain Score  7     Pain Location  Back   and shoulder        [facility] PT Assessment - 01/27/19 0001      AROM   Overall AROM Comments  Cervical ROM is decreased 25% with c/o pain in the Head and the arm with ROM especially right rotation, Shoulder ROM WFL's with tightness and pain                   [facility] Adult PT Treatment/Exercise - 01/27/19 0001      Neck Exercises: Machines for Strengthening   Other Machines for Strengthening  Rows & Lats 20lb 2x10       Neck Exercises: Seated   Neck Retraction  3 secs;20 reps;10 reps    Shoulder Rolls  Backwards;10 reps    Shoulder Flexion  Both;10 reps;Weights    Shoulder Flexion Weights (lbs)  2    Shoulder ABduction  Both;Weights;20 reps    Shoulder Abduction Weights  (lbs)  2    Other Seated Exercise  Shoulder ER yellow  2x10       Knee/Hip Exercises: Aerobic   Nustep  level 4 x 7 minutes      Modalities   Modalities  Electrical Stimulation;Moist Heat      Electrical Stimulation   Electrical Stimulation Location  neck right upper trap and into the rhomboid    Electrical Stimulation Action  IFC    Electrical Stimulation Parameters  supine    Electrical Stimulation Goals  Pain               PT Short Term Goals - 01/20/19 1426      PT SHORT TERM GOAL #1   Title  independent with initial HEP    Time  2    Period  Weeks    Status  New        PT Long Term Goals - 01/25/19 1233      PT LONG TERM GOAL #1   Title  decrease pain 50%    Status  Partially Met      PT LONG TERM GOAL #2   Title  get up from sitting without pain and without hands    Status  Partially Met      PT LONG TERM GOAL #3   Title  increase cervical ROM 25%    Status  On-going      PT LONG TERM GOAL #4   Title  report 50% less HA    Status  Partially Met            Plan - 01/27/19 1430    Clinical Impression Statement  Pt has progressed increasing her cervical ROM. Increase weight tolerated with shoulder flex and abduction. Progressed to some machine level postural strengthening exercises. Cues for posture needed with seated external rotation.    Comorbidities  DJD right knee, 5 lumbar surgeries, osteoporosis    Stability/Clinical Decision Making  Evolving/Moderate complexity    Rehab Potential  Good    PT Frequency  3x / week    PT Duration  8 weeks    PT Treatment/Interventions  ADLs/Self Care Home Management;Electrical Stimulation;Iontophoresis 61m/ml Dexamethasone;Moist Heat;Ultrasound;Functional mobility training;Stair training;Gait training;Therapeutic activities;Therapeutic exercise;Balance training;Neuromuscular re-education;Manual techniques;Patient/family education;Traction;Cryotherapy;Dry needling    PT Next Visit Plan  May try traction,  needs to get the spasms decreased       Patient will benefit from skilled therapeutic intervention in  order to improve the following deficits and impairments:  Pain, Postural dysfunction, Increased muscle spasms, Cardiopulmonary status limiting activity, Decreased activity tolerance, Decreased endurance, Decreased range of motion, Decreased strength, Decreased balance, Difficulty walking, Impaired flexibility, Improper body mechanics, Impaired UE functional use  Visit Diagnosis: Cervicalgia  Cramp and spasm  Radiculopathy, cervical region     Problem List Patient Active Problem List   Diagnosis Date Noted  . Fracture, proximal femur ([place]) 06/12/2018  . Femur fracture, left ([place]) 06/12/2018  . Acute blood loss as cause of postoperative anemia 03/30/2017  . Diabetic polyneuropathy associated with secondary diabetes mellitus ([place]) 03/30/2017  . Asthma 03/30/2017  . Dyslipidemia 03/26/2017  . Lumbar stenosis with neurogenic claudication 03/17/2017  . Spinal stenosis of lumbar region 03/03/2017  . Myoclonic jerking   . Nausea   . Acute encephalopathy 07/15/2016  . Lumbar radiculopathy 06/27/2016  . Hypothyroidism 06/26/2016  . Labile blood glucose   . Surgery, elective   . Post-operative pain   . Sickle cell trait ([place])   . Acute blood loss anemia   . History of back surgery   . AKI (acute kidney injury) ([place])   . Herniated nucleus pulposus, L2-3 06/25/2016  . Bilateral primary osteoarthritis of knee 03/26/2016  . Osteoarthritis, hand 03/26/2016  . Other insomnia 03/26/2016  . Fibromyalgia 09/27/2014  . Nocturnal leg cramps 09/27/2014  . Body mass index (BMI) of 30.0-30.9 in adult 08/04/2014  . Neuropathy 03/10/2014  . Allergic rhinitis 03/10/2014  . Osteoporosis 03/10/2014  . Spinal stenosis of lumbar region with radiculopathy 02/28/2014  . Type 2 diabetes mellitus with complication, with long-term current use of insulin ([employer]) 07/08/2013  . Hypokalemia 11/02/2012  .  Essential hypertension, benign 11/02/2012  . Diabetes mellitus with neuropathy ([place]) 10/29/2012  . Hyperlipidemia 10/29/2012  . Spondylolisthesis of lumbar region 03/19/2011  . Lumbar radicular pain 03/19/2011    Scot Jun, PTA 01/27/2019, 2:40 PM  [place], [place], [address] Phone: [phone]   Fax:  [phone]  Name: [name] MRN: [id number] Date of Birth: Jul 17, 1940

## 2019-01-29 ENCOUNTER — Encounter: Payer: Self-pay | Admitting: Physical Therapy

## 2019-01-29 ENCOUNTER — Other Ambulatory Visit: Payer: Self-pay

## 2019-01-29 ENCOUNTER — Ambulatory Visit: Payer: Medicare Other | Admitting: Physical Therapy

## 2019-01-29 DIAGNOSIS — M542 Cervicalgia: Secondary | ICD-10-CM | POA: Diagnosis not present

## 2019-01-29 DIAGNOSIS — M5412 Radiculopathy, cervical region: Secondary | ICD-10-CM

## 2019-01-29 DIAGNOSIS — R252 Cramp and spasm: Secondary | ICD-10-CM | POA: Diagnosis not present

## 2019-01-29 DIAGNOSIS — R262 Difficulty in walking, not elsewhere classified: Secondary | ICD-10-CM | POA: Diagnosis not present

## 2019-01-29 NOTE — Therapy (Signed)
[place], [place], [address] Phone: [phone]   Fax:  [phone]  Physical Therapy Treatment  Patient Details  Name: [name] MRN: [id number] Date of Birth: 08/21/1940 Referring Provider (PT): Elsner   Encounter Date: 01/29/2019  PT End of Session - 01/29/19 1141    Visit Number  5    Date for PT Re-Evaluation  03/22/19    Authorization Type  Medicare    PT Start Time  1057    PT Stop Time  1152    PT Time Calculation (min)  55 min    Activity Tolerance  Patient tolerated treatment well    Behavior During Therapy  [facility] for tasks assessed/performed       Past Medical History:  Diagnosis Date  . Acute blood loss anemia   . Acute encephalopathy 07/15/2016  . AKI (acute kidney injury) ([place])   . Anemia    has sickle cell trait  . Anxiety   . Arthritis   . Asthma    has used inhaler in past for asthmatic bronchitis, last time- early 2012  . Bilateral primary osteoarthritis of knee 03/26/2016  . Complication of anesthesia    wakes up shaking  . Diabetes mellitus   . Diabetes mellitus with neuropathy ([place]) 10/29/2012  . Diverticulitis   . Dyslipidemia   . Encephalopathy 06/2016   due to medications after surgery  . Fibromyalgia   . GERD (gastroesophageal reflux disease)    occas. use of  Prilosec  . Heart murmur    sees Dr. Montez Morita, last seen- early 2012  . Herniated nucleus pulposus, L2-3 06/25/2016  . History of back surgery   . Hypertension    02/2010- stress test /w PCP  . Hypothyroidism   . Loose bowel movements 12/2016  . Lumbar radiculopathy 06/27/2016  . Lumbar stenosis with neurogenic claudication 03/17/2017  . Memory disorder 02/16/2016  . Myoclonic jerking   . Neuromuscular disorder (HCC)    lumbar radiculopathy, lumbago  . Nocturnal leg cramps 09/27/2014  . Osteoporosis 03/10/2014  . Pneumonia   . Sickle cell trait ([place])   . Sleep apnea    borderline sleep  apnea, states she no longer uses, early 2012- stopped using   . Spinal stenosis of lumbar region with radiculopathy 02/28/2014  . Spondylolisthesis of lumbar region 03/19/2011  . Type II or unspecified type diabetes mellitus without mention of complication, uncontrolled 07/08/2013    Past Surgical History:  Procedure Laterality Date  . ABDOMINAL HYSTERECTOMY    . adb.cyst     ovarian cyst  . BACK SURGERY     2012, 2015 (3 total)  . BACK SURGERY  2018   02/24/2017  . COLONOSCOPY    . EYE SURGERY     macular degeneration treatment - injections  . FEMUR IM NAIL Left 06/13/2018   Procedure: INTRAMEDULLARY (IM) NAIL FEMORAL;  Surgeon: [name], MD;  [facility]: [facility];  Service: Orthopedics;  Laterality: Left;  . OVARIAN CYST SURGERY    . POSTERIOR LUMBAR FUSION 4 LEVEL N/A 03/17/2017   Procedure: Decompression of Lumbar One-Two with Thoracic Ten to Lumbar Two Fusion;  Surgeon: Kristeen Miss, MD;  Location: [facility];  Service: Neurosurgery;  Laterality: N/A;  Decompression of L1-2 with T10 to L2 Fusion    There were no vitals filed for this visit.  Subjective Assessment - 01/29/19 1101    Subjective  "hurting a whole lot, working muscles I  haven't used"    Currently in Pain?  Yes    Pain Score  7     Pain Location  Back                       [facility] Adult PT Treatment/Exercise - 01/29/19 0001      Neck Exercises: Machines for Strengthening   Other Machines for Strengthening  Rows & Lats 15lb 2x10     Other Machines for Strengthening  back Ext black band 2x10      Neck Exercises: Standing   Other Standing Exercises  Shoulder Ext yellow 2x10       Neck Exercises: Seated   Neck Retraction  3 secs;20 reps;10 reps    Shoulder Rolls  Backwards;10 reps    Shoulder Rolls Limitations  2lb    Other Seated Exercise  Shoulder ER yellow  2x10; Horiz abd yellow tband 2x10       Knee/Hip Exercises: Aerobic   Nustep  level 4 x 7 minutes      Modalities   Modalities   Electrical Stimulation;Moist Heat      Electrical Stimulation   Electrical Stimulation Location  neck right upper trap and into the rhomboid    Electrical Stimulation Action  IFC    Electrical Stimulation Parameters  supine    Electrical Stimulation Goals  Pain               PT Short Term Goals - 01/20/19 1426      PT SHORT TERM GOAL #1   Title  independent with initial HEP    Time  2    Period  Weeks    Status  New        PT Long Term Goals - 01/25/19 1233      PT LONG TERM GOAL #1   Title  decrease pain 50%    Status  Partially Met      PT LONG TERM GOAL #2   Title  get up from sitting without pain and without hands    Status  Partially Met      PT LONG TERM GOAL #3   Title  increase cervical ROM 25%    Status  On-going      PT LONG TERM GOAL #4   Title  report 50% less HA    Status  Partially Met            Plan - 01/29/19 1143    Clinical Impression Statement  Continued with the machine level intervention but at a lower weight. She did report some pain with horizontal abduction. She continues to do neck retraction really well. Added some resistance to shoulder shrugs and reverse rolls.    Comorbidities  DJD right knee, 5 lumbar surgeries, osteoporosis    Stability/Clinical Decision Making  Evolving/Moderate complexity    Rehab Potential  Good    PT Frequency  3x / week    PT Treatment/Interventions  ADLs/Self Care Home Management;Electrical Stimulation;Iontophoresis 61m/ml Dexamethasone;Moist Heat;Ultrasound;Functional mobility training;Stair training;Gait training;Therapeutic activities;Therapeutic exercise;Balance training;Neuromuscular re-education;Manual techniques;Patient/family education;Traction;Cryotherapy;Dry needling    PT Next Visit Plan  May try traction, needs to get the spasms decreased       Patient will benefit from skilled therapeutic intervention in order to improve the following deficits and impairments:  Pain, Postural  dysfunction, Increased muscle spasms, Cardiopulmonary status limiting activity, Decreased activity tolerance, Decreased endurance, Decreased range of motion, Decreased strength, Decreased balance, Difficulty walking, Impaired flexibility, Improper body  mechanics, Impaired UE functional use  Visit Diagnosis: Cramp and spasm  Radiculopathy, cervical region     Problem List Patient Active Problem List   Diagnosis Date Noted  . Fracture, proximal femur ([place]) 06/12/2018  . Femur fracture, left ([place]) 06/12/2018  . Acute blood loss as cause of postoperative anemia 03/30/2017  . Diabetic polyneuropathy associated with secondary diabetes mellitus ([place]) 03/30/2017  . Asthma 03/30/2017  . Dyslipidemia 03/26/2017  . Lumbar stenosis with neurogenic claudication 03/17/2017  . Spinal stenosis of lumbar region 03/03/2017  . Myoclonic jerking   . Nausea   . Acute encephalopathy 07/15/2016  . Lumbar radiculopathy 06/27/2016  . Hypothyroidism 06/26/2016  . Labile blood glucose   . Surgery, elective   . Post-operative pain   . Sickle cell trait ([place])   . Acute blood loss anemia   . History of back surgery   . AKI (acute kidney injury) ([place])   . Herniated nucleus pulposus, L2-3 06/25/2016  . Bilateral primary osteoarthritis of knee 03/26/2016  . Osteoarthritis, hand 03/26/2016  . Other insomnia 03/26/2016  . Fibromyalgia 09/27/2014  . Nocturnal leg cramps 09/27/2014  . Body mass index (BMI) of 30.0-30.9 in adult 08/04/2014  . Neuropathy 03/10/2014  . Allergic rhinitis 03/10/2014  . Osteoporosis 03/10/2014  . Spinal stenosis of lumbar region with radiculopathy 02/28/2014  . Type 2 diabetes mellitus with complication, with long-term current use of insulin ([place]) 07/08/2013  . Hypokalemia 11/02/2012  . Essential hypertension, benign 11/02/2012  . Diabetes mellitus with neuropathy ([place]) 10/29/2012  . Hyperlipidemia 10/29/2012  . Spondylolisthesis of lumbar region 03/19/2011  . Lumbar  radicular pain 03/19/2011    Scot Jun, PTA 01/29/2019, 11:48 AM  [place], [place], [address] Phone: [phone]   Fax:  [phone]  Name: SHAUNDRA FULLAM MRN: [id number] Date of Birth: [birth year]

## 2019-02-01 ENCOUNTER — Ambulatory Visit: Payer: Medicare Other | Admitting: Physical Therapy

## 2019-02-01 ENCOUNTER — Other Ambulatory Visit: Payer: Self-pay

## 2019-02-01 ENCOUNTER — Encounter: Payer: Self-pay | Admitting: Physical Therapy

## 2019-02-01 DIAGNOSIS — M542 Cervicalgia: Secondary | ICD-10-CM | POA: Diagnosis not present

## 2019-02-01 DIAGNOSIS — R252 Cramp and spasm: Secondary | ICD-10-CM | POA: Diagnosis not present

## 2019-02-01 DIAGNOSIS — R262 Difficulty in walking, not elsewhere classified: Secondary | ICD-10-CM | POA: Diagnosis not present

## 2019-02-01 DIAGNOSIS — M5412 Radiculopathy, cervical region: Secondary | ICD-10-CM

## 2019-02-01 NOTE — Therapy (Deleted)
[place], [place], [address] Phone: [phone]   Fax:  [phone]  Physical Therapy Treatment  Patient Details  Name: [name] MRN: [id number] Date of Birth: 31-May-1940 Referring Provider (PT): Elsner   Encounter Date: 02/01/2019  PT End of Session - 02/01/19 1427    Visit Number  6    Date for PT Re-Evaluation  03/22/19    PT Start Time  0093    PT Stop Time  1432    PT Time Calculation (min)  45 min       Past Medical History:  Diagnosis Date  . Acute blood loss anemia   . Acute encephalopathy 07/15/2016  . AKI (acute kidney injury) ([place])   . Anemia    has sickle cell trait  . Anxiety   . Arthritis   . Asthma    has used inhaler in past for asthmatic bronchitis, last time- early 2012  . Bilateral primary osteoarthritis of knee 03/26/2016  . Complication of anesthesia    wakes up shaking  . Diabetes mellitus   . Diabetes mellitus with neuropathy ([place]) 10/29/2012  . Diverticulitis   . Dyslipidemia   . Encephalopathy 06/2016   due to medications after surgery  . Fibromyalgia   . GERD (gastroesophageal reflux disease)    occas. use of  Prilosec  . Heart murmur    sees Dr. Montez Morita, last seen- early 2012  . Herniated nucleus pulposus, L2-3 06/25/2016  . History of back surgery   . Hypertension    02/2010- stress test /w PCP  . Hypothyroidism   . Loose bowel movements 12/2016  . Lumbar radiculopathy 06/27/2016  . Lumbar stenosis with neurogenic claudication 03/17/2017  . Memory disorder 02/16/2016  . Myoclonic jerking   . Neuromuscular disorder (HCC)    lumbar radiculopathy, lumbago  . Nocturnal leg cramps 09/27/2014  . Osteoporosis 03/10/2014  . Pneumonia   . Sickle cell trait ([place])   . Sleep apnea    borderline sleep apnea, states she no longer uses, early 2012- stopped using   . Spinal stenosis of lumbar region with radiculopathy 02/28/2014  . Spondylolisthesis of  lumbar region 03/19/2011  . Type II or unspecified type diabetes mellitus without mention of complication, uncontrolled 07/08/2013    Past Surgical History:  Procedure Laterality Date  . ABDOMINAL HYSTERECTOMY    . adb.cyst     ovarian cyst  . BACK SURGERY     2012, 2015 (3 total)  . BACK SURGERY  2018   02/24/2017  . COLONOSCOPY    . EYE SURGERY     macular degeneration treatment - injections  . FEMUR IM NAIL Left 06/13/2018   Procedure: INTRAMEDULLARY (IM) NAIL FEMORAL;  Surgeon: [name], MD;  [facility];  Service: Orthopedics;  Laterality: Left;  . OVARIAN CYST SURGERY    . POSTERIOR LUMBAR FUSION 4 LEVEL N/A 03/17/2017   Procedure: Decompression of Lumbar One-Two with Thoracic Ten to Lumbar Two Fusion;  Surgeon: Kristeen Miss, MD;  Location: [facility];  Service: Neurosurgery;  Laterality: N/A;  Decompression of L1-2 with T10 to L2 Fusion    There were no vitals filed for this visit.  Subjective Assessment - 02/01/19 1352    Subjective  pt stated she is feeling more pain than usual today. She rates her pain as a 7/10 in her back and next. "didn't sleep well last night"    Currently in Pain?  Yes  Pain Score  7     Pain Location  Back                       [facility] Adult PT Treatment/Exercise - 02/01/19 0001      Neck Exercises: Machines for Strengthening   Other Machines for Strengthening  Rows & Lats 15lb 2x10     Other Machines for Strengthening  back Ext black band 2x10      Neck Exercises: Standing   Other Standing Exercises  Shoulder Ext yellow 2x10       Neck Exercises: Seated   Neck Retraction  20 reps;3 secs   with ball    Shoulder Rolls  Backwards;10 reps    Other Seated Exercise  Shoulder ER yellow  2x10; Horiz abd yellow tband 2x10       Knee/Hip Exercises: Aerobic   Nustep  level 4 x 7 minutes      Modalities   Modalities  Electrical Stimulation;Moist Heat      Electrical Stimulation   Electrical Stimulation Location   neck right upper trap and into the rhomboid    Electrical Stimulation Action  IFC    Electrical Stimulation Parameters  sitting    Electrical Stimulation Goals  Pain               PT Short Term Goals - 01/20/19 1426      PT SHORT TERM GOAL #1   Title  independent with initial HEP    Time  2    Period  Weeks    Status  New        PT Long Term Goals - 01/25/19 1233      PT LONG TERM GOAL #1   Title  decrease pain 50%    Status  Partially Met      PT LONG TERM GOAL #2   Title  get up from sitting without pain and without hands    Status  Partially Met      PT LONG TERM GOAL #3   Title  increase cervical ROM 25%    Status  On-going      PT LONG TERM GOAL #4   Title  report 50% less HA    Status  Partially Met            Plan - 02/01/19 1424    Clinical Impression Statement  Pt complains of pain during horizontal abduction. Pt requires cues for ER and horiztonal abduction.       Patient will benefit from skilled therapeutic intervention in order to improve the following deficits and impairments:     Visit Diagnosis: Radiculopathy, cervical region  Cervicalgia     Problem List Patient Active Problem List   Diagnosis Date Noted  . Fracture, proximal femur ([place]) 06/12/2018  . Femur fracture, left ([place]) 06/12/2018  . Acute blood loss as cause of postoperative anemia 03/30/2017  . Diabetic polyneuropathy associated with secondary diabetes mellitus ([place]) 03/30/2017  . Asthma 03/30/2017  . Dyslipidemia 03/26/2017  . Lumbar stenosis with neurogenic claudication 03/17/2017  . Spinal stenosis of lumbar region 03/03/2017  . Myoclonic jerking   . Nausea   . Acute encephalopathy 07/15/2016  . Lumbar radiculopathy 06/27/2016  . Hypothyroidism 06/26/2016  . Labile blood glucose   . Surgery, elective   . Post-operative pain   . Sickle cell trait ([place])   . Acute blood loss anemia   . History of back surgery   . AKI (  acute kidney injury) ([place])   .  Herniated nucleus pulposus, L2-3 06/25/2016  . Bilateral primary osteoarthritis of knee 03/26/2016  . Osteoarthritis, hand 03/26/2016  . Other insomnia 03/26/2016  . Fibromyalgia 09/27/2014  . Nocturnal leg cramps 09/27/2014  . Body mass index (BMI) of 30.0-30.9 in adult 08/04/2014  . Neuropathy 03/10/2014  . Allergic rhinitis 03/10/2014  . Osteoporosis 03/10/2014  . Spinal stenosis of lumbar region with radiculopathy 02/28/2014  . Type 2 diabetes mellitus with complication, with long-term current use of insulin ([place]) 07/08/2013  . Hypokalemia 11/02/2012  . Essential hypertension, benign 11/02/2012  . Diabetes mellitus with neuropathy ([place]) 10/29/2012  . Hyperlipidemia 10/29/2012  . Spondylolisthesis of lumbar region 03/19/2011  . Lumbar radicular pain 03/19/2011    Barrett Henle, [place] 02/01/2019, 2:33 PM  [place], [place], [address] Phone: [phone]   Fax:  [phone]  Name: [name] MRN: [id number] Date of Birth: 12/05/1940

## 2019-02-01 NOTE — Therapy (Signed)
[place], [place], [address] Phone: [phone]   Fax:  [phone]  Physical Therapy Treatment  Patient Details  Name: [name] MRN: [id number] Date of Birth: 31-May-1940 Referring Provider (PT): Elsner   Encounter Date: 02/01/2019  PT End of Session - 02/01/19 1427    Visit Number  6    Date for PT Re-Evaluation  03/22/19    PT Start Time  0093    PT Stop Time  1432    PT Time Calculation (min)  45 min       Past Medical History:  Diagnosis Date  . Acute blood loss anemia   . Acute encephalopathy 07/15/2016  . AKI (acute kidney injury) ([place])   . Anemia    has sickle cell trait  . Anxiety   . Arthritis   . Asthma    has used inhaler in past for asthmatic bronchitis, last time- early 2012  . Bilateral primary osteoarthritis of knee 03/26/2016  . Complication of anesthesia    wakes up shaking  . Diabetes mellitus   . Diabetes mellitus with neuropathy ([place]) 10/29/2012  . Diverticulitis   . Dyslipidemia   . Encephalopathy 06/2016   due to medications after surgery  . Fibromyalgia   . GERD (gastroesophageal reflux disease)    occas. use of  Prilosec  . Heart murmur    sees Dr. Montez Morita, last seen- early 2012  . Herniated nucleus pulposus, L2-3 06/25/2016  . History of back surgery   . Hypertension    02/2010- stress test /w PCP  . Hypothyroidism   . Loose bowel movements 12/2016  . Lumbar radiculopathy 06/27/2016  . Lumbar stenosis with neurogenic claudication 03/17/2017  . Memory disorder 02/16/2016  . Myoclonic jerking   . Neuromuscular disorder (HCC)    lumbar radiculopathy, lumbago  . Nocturnal leg cramps 09/27/2014  . Osteoporosis 03/10/2014  . Pneumonia   . Sickle cell trait ([place])   . Sleep apnea    borderline sleep apnea, states she no longer uses, early 2012- stopped using   . Spinal stenosis of lumbar region with radiculopathy 02/28/2014  . Spondylolisthesis of  lumbar region 03/19/2011  . Type II or unspecified type diabetes mellitus without mention of complication, uncontrolled 07/08/2013    Past Surgical History:  Procedure Laterality Date  . ABDOMINAL HYSTERECTOMY    . adb.cyst     ovarian cyst  . BACK SURGERY     2012, 2015 (3 total)  . BACK SURGERY  2018   02/24/2017  . COLONOSCOPY    . EYE SURGERY     macular degeneration treatment - injections  . FEMUR IM NAIL Left 06/13/2018   Procedure: INTRAMEDULLARY (IM) NAIL FEMORAL;  Surgeon: [name], MD;  [facility];  Service: Orthopedics;  Laterality: Left;  . OVARIAN CYST SURGERY    . POSTERIOR LUMBAR FUSION 4 LEVEL N/A 03/17/2017   Procedure: Decompression of Lumbar One-Two with Thoracic Ten to Lumbar Two Fusion;  Surgeon: Kristeen Miss, MD;  Location: [facility];  Service: Neurosurgery;  Laterality: N/A;  Decompression of L1-2 with T10 to L2 Fusion    There were no vitals filed for this visit.  Subjective Assessment - 02/01/19 1352    Subjective  pt stated she is feeling more pain than usual today. She rates her pain as a 7/10 in her back and next. "didn't sleep well last night"    Currently in Pain?  Yes  Pain Score  7     Pain Location  Back                       [facility] Adult PT Treatment/Exercise - 02/01/19 0001      Neck Exercises: Machines for Strengthening   Other Machines for Strengthening  Rows & Lats 15lb 2x10     Other Machines for Strengthening  back Ext black band 2x10      Neck Exercises: Standing   Other Standing Exercises  Shoulder Ext yellow 2x10       Neck Exercises: Seated   Neck Retraction  20 reps;3 secs   with ball    Shoulder Rolls  Backwards;10 reps    Other Seated Exercise  Shoulder ER yellow  2x10; Horiz abd yellow tband 2x10       Knee/Hip Exercises: Aerobic   Nustep  level 4 x 7 minutes      Modalities   Modalities  Electrical Stimulation;Moist Heat      Electrical Stimulation   Electrical Stimulation Location   neck right upper trap and into the rhomboid    Electrical Stimulation Action  IFC    Electrical Stimulation Parameters  sitting    Electrical Stimulation Goals  Pain               PT Short Term Goals - 01/20/19 1426      PT SHORT TERM GOAL #1   Title  independent with initial HEP    Time  2    Period  Weeks    Status  New        PT Long Term Goals - 01/25/19 1233      PT LONG TERM GOAL #1   Title  decrease pain 50%    Status  Partially Met      PT LONG TERM GOAL #2   Title  get up from sitting without pain and without hands    Status  Partially Met      PT LONG TERM GOAL #3   Title  increase cervical ROM 25%    Status  On-going      PT LONG TERM GOAL #4   Title  report 50% less HA    Status  Partially Met            Plan - 02/01/19 1424    Clinical Impression Statement  Pt was able to complete all of today's interventions. Pt complains of pain during horizontal abduction. Pt requires cues for ER and horiztonal abduction.    Stability/Clinical Decision Making  Evolving/Moderate complexity    Rehab Potential  Good    PT Frequency  3x / week    PT Duration  8 weeks    PT Treatment/Interventions  ADLs/Self Care Home Management;Electrical Stimulation;Iontophoresis '4mg'$ /ml Dexamethasone;Moist Heat;Ultrasound;Functional mobility training;Stair training;Gait training;Therapeutic activities;Therapeutic exercise;Balance training;Neuromuscular re-education;Manual techniques;Patient/family education;Traction;Cryotherapy;Dry needling    PT Next Visit Plan  Postural strengthening and cervical ROM       Patient will benefit from skilled therapeutic intervention in order to improve the following deficits and impairments:  Pain, Postural dysfunction, Increased muscle spasms, Cardiopulmonary status limiting activity, Decreased activity tolerance, Decreased endurance, Decreased range of motion, Decreased strength, Decreased balance, Difficulty walking, Impaired flexibility,  Improper body mechanics, Impaired UE functional use  Visit Diagnosis: Radiculopathy, cervical region  Cervicalgia     Problem List Patient Active Problem List   Diagnosis Date Noted  . Fracture, proximal femur ([place]) 06/12/2018  . Femur  fracture, left ([place]) 06/12/2018  . Acute blood loss as cause of postoperative anemia 03/30/2017  . Diabetic polyneuropathy associated with secondary diabetes mellitus ([place]) 03/30/2017  . Asthma 03/30/2017  . Dyslipidemia 03/26/2017  . Lumbar stenosis with neurogenic claudication 03/17/2017  . Spinal stenosis of lumbar region 03/03/2017  . Myoclonic jerking   . Nausea   . Acute encephalopathy 07/15/2016  . Lumbar radiculopathy 06/27/2016  . Hypothyroidism 06/26/2016  . Labile blood glucose   . Surgery, elective   . Post-operative pain   . Sickle cell trait ([place])   . Acute blood loss anemia   . History of back surgery   . AKI (acute kidney injury) ([place])   . Herniated nucleus pulposus, L2-3 06/25/2016  . Bilateral primary osteoarthritis of knee 03/26/2016  . Osteoarthritis, hand 03/26/2016  . Other insomnia 03/26/2016  . Fibromyalgia 09/27/2014  . Nocturnal leg cramps 09/27/2014  . Body mass index (BMI) of 30.0-30.9 in adult 08/04/2014  . Neuropathy 03/10/2014  . Allergic rhinitis 03/10/2014  . Osteoporosis 03/10/2014  . Spinal stenosis of lumbar region with radiculopathy 02/28/2014  . Type 2 diabetes mellitus with complication, with long-term current use of insulin ([place]) 07/08/2013  . Hypokalemia 11/02/2012  . Essential hypertension, benign 11/02/2012  . Diabetes mellitus with neuropathy ([place]) 10/29/2012  . Hyperlipidemia 10/29/2012  . Spondylolisthesis of lumbar region 03/19/2011  . Lumbar radicular pain 03/19/2011    Scot Jun 02/01/2019, 2:39 PM  [place], [place], [address] Phone: [phone]   Fax:  [phone]  Name: [name] MRN: [id number] Date of Birth: [birth year]

## 2019-02-03 ENCOUNTER — Ambulatory Visit: Payer: Medicare Other | Admitting: Physical Therapy

## 2019-02-03 ENCOUNTER — Other Ambulatory Visit: Payer: Self-pay

## 2019-02-03 DIAGNOSIS — M542 Cervicalgia: Secondary | ICD-10-CM | POA: Diagnosis not present

## 2019-02-03 DIAGNOSIS — M5412 Radiculopathy, cervical region: Secondary | ICD-10-CM

## 2019-02-03 DIAGNOSIS — R262 Difficulty in walking, not elsewhere classified: Secondary | ICD-10-CM | POA: Diagnosis not present

## 2019-02-03 DIAGNOSIS — R252 Cramp and spasm: Secondary | ICD-10-CM | POA: Diagnosis not present

## 2019-02-03 NOTE — Therapy (Signed)
[place], [place], [address] Phone: [phone]   Fax:  [phone]  Physical Therapy Treatment  Patient Details  Name: [name] MRN: [id number] Date of Birth: 05-06-1940 Referring Provider (PT): Elsner   Encounter Date: 02/03/2019  PT End of Session - 02/03/19 1425    Visit Number  7    PT Start Time  8588    PT Stop Time  5027    PT Time Calculation (min)  44 min       Past Medical History:  Diagnosis Date  . Acute blood loss anemia   . Acute encephalopathy 07/15/2016  . AKI (acute kidney injury) ([place])   . Anemia    has sickle cell trait  . Anxiety   . Arthritis   . Asthma    has used inhaler in past for asthmatic bronchitis, last time- early 2012  . Bilateral primary osteoarthritis of knee 03/26/2016  . Complication of anesthesia    wakes up shaking  . Diabetes mellitus   . Diabetes mellitus with neuropathy ([place]) 10/29/2012  . Diverticulitis   . Dyslipidemia   . Encephalopathy 06/2016   due to medications after surgery  . Fibromyalgia   . GERD (gastroesophageal reflux disease)    occas. use of  Prilosec  . Heart murmur    sees Dr. Montez Morita, last seen- early 2012  . Herniated nucleus pulposus, L2-3 06/25/2016  . History of back surgery   . Hypertension    02/2010- stress test /w PCP  . Hypothyroidism   . Loose bowel movements 12/2016  . Lumbar radiculopathy 06/27/2016  . Lumbar stenosis with neurogenic claudication 03/17/2017  . Memory disorder 02/16/2016  . Myoclonic jerking   . Neuromuscular disorder (HCC)    lumbar radiculopathy, lumbago  . Nocturnal leg cramps 09/27/2014  . Osteoporosis 03/10/2014  . Pneumonia   . Sickle cell trait ([place])   . Sleep apnea    borderline sleep apnea, states she no longer uses, early 2012- stopped using   . Spinal stenosis of lumbar region with radiculopathy 02/28/2014  . Spondylolisthesis of lumbar region 03/19/2011  . Type II or  unspecified type diabetes mellitus without mention of complication, uncontrolled 07/08/2013    Past Surgical History:  Procedure Laterality Date  . ABDOMINAL HYSTERECTOMY    . adb.cyst     ovarian cyst  . BACK SURGERY     2012, 2015 (3 total)  . BACK SURGERY  2018   02/24/2017  . COLONOSCOPY    . EYE SURGERY     macular degeneration treatment - injections  . FEMUR IM NAIL Left 06/13/2018   Procedure: INTRAMEDULLARY (IM) NAIL FEMORAL;  Surgeon: [name], MD;  [facility]: [facility];  Service: Orthopedics;  Laterality: Left;  . OVARIAN CYST SURGERY    . POSTERIOR LUMBAR FUSION 4 LEVEL N/A 03/17/2017   Procedure: Decompression of Lumbar One-Two with Thoracic Ten to Lumbar Two Fusion;  Surgeon: Kristeen Miss, MD;  Location: [facility];  Service: Neurosurgery;  Laterality: N/A;  Decompression of L1-2 with T10 to L2 Fusion    There were no vitals filed for this visit.  Subjective Assessment - 02/03/19 1351    Subjective  "feeling some pain"    Currently in Pain?  Yes    Pain Score  7     Pain Location  Back  [facility] Adult PT Treatment/Exercise - 02/03/19 0001      Neck Exercises: Machines for Strengthening   Other Machines for Strengthening  Rows & Lats 15lb 2x10       Neck Exercises: Seated   Neck Retraction  20 reps;3 secs    Shoulder Rolls  Backwards;10 reps    Shoulder Rolls Limitations  2lb    Other Seated Exercise  rows, shoulder ext, flys; seated; with 2#    Other Seated Exercise  Shoulder ER yellow  2x10; Horiz abd yellow tband 2x10       Knee/Hip Exercises: Aerobic   Nustep  level 4 x 7 minutes      Electrical Stimulation   Electrical Stimulation Location  neck right upper trap and into the rhomboid    Electrical Stimulation Action  IFC    Electrical Stimulation Parameters  sitting    Electrical Stimulation Goals  Pain               PT Short Term Goals - 01/20/19 1426      PT SHORT TERM GOAL #1   Title   independent with initial HEP    Time  2    Period  Weeks    Status  New        PT Long Term Goals - 01/25/19 1233      PT LONG TERM GOAL #1   Title  decrease pain 50%    Status  Partially Met      PT LONG TERM GOAL #2   Title  get up from sitting without pain and without hands    Status  Partially Met      PT LONG TERM GOAL #3   Title  increase cervical ROM 25%    Status  On-going      PT LONG TERM GOAL #4   Title  report 50% less HA    Status  Partially Met            Plan - 02/03/19 1427    Clinical Impression Statement  pt handled treatment well as demonsrated with no increase of pain during interventions. pt requires cues for shoulder ER to keep elbows by her side.    Personal Factors and Comorbidities  Comorbidity 3+    Rehab Potential  Good    PT Frequency  3x / week    PT Duration  8 weeks    PT Treatment/Interventions  ADLs/Self Care Home Management;Electrical Stimulation;Iontophoresis 25m/ml Dexamethasone;Moist Heat;Ultrasound;Functional mobility training;Stair training;Gait training;Therapeutic activities;Therapeutic exercise;Balance training;Neuromuscular re-education;Manual techniques;Patient/family education;Traction;Cryotherapy;Dry needling    PT Next Visit Plan  Postural strengthening and cervical ROM       Patient will benefit from skilled therapeutic intervention in order to improve the following deficits and impairments:  Pain, Postural dysfunction, Increased muscle spasms, Cardiopulmonary status limiting activity, Decreased activity tolerance, Decreased endurance, Decreased range of motion, Decreased strength, Decreased balance, Difficulty walking, Impaired flexibility, Improper body mechanics, Impaired UE functional use  Visit Diagnosis: Cervicalgia  Radiculopathy, cervical region     Problem List Patient Active Problem List   Diagnosis Date Noted  . Fracture, proximal femur ([place] 06/12/2018  . Femur fracture, left ([place] 06/12/2018  .  Acute blood loss as cause of postoperative anemia 03/30/2017  . Diabetic polyneuropathy associated with secondary diabetes mellitus ([place] 03/30/2017  . Asthma 03/30/2017  . Dyslipidemia 03/26/2017  . Lumbar stenosis with neurogenic claudication 03/17/2017  . Spinal stenosis of lumbar region 03/03/2017  . Myoclonic jerking   . Nausea   .  Acute encephalopathy 07/15/2016  . Lumbar radiculopathy 06/27/2016  . Hypothyroidism 06/26/2016  . Labile blood glucose   . Surgery, elective   . Post-operative pain   . Sickle cell trait ([place])   . Acute blood loss anemia   . History of back surgery   . AKI (acute kidney injury) ([place])   . Herniated nucleus pulposus, L2-3 06/25/2016  . Bilateral primary osteoarthritis of knee 03/26/2016  . Osteoarthritis, hand 03/26/2016  . Other insomnia 03/26/2016  . Fibromyalgia 09/27/2014  . Nocturnal leg cramps 09/27/2014  . Body mass index (BMI) of 30.0-30.9 in adult 08/04/2014  . Neuropathy 03/10/2014  . Allergic rhinitis 03/10/2014  . Osteoporosis 03/10/2014  . Spinal stenosis of lumbar region with radiculopathy 02/28/2014  . Type 2 diabetes mellitus with complication, with long-term current use of insulin ([place]) 07/08/2013  . Hypokalemia 11/02/2012  . Essential hypertension, benign 11/02/2012  . Diabetes mellitus with neuropathy ([place]) 10/29/2012  . Hyperlipidemia 10/29/2012  . Spondylolisthesis of lumbar region 03/19/2011  . Lumbar radicular pain 03/19/2011    Barrett Henle 02/03/2019, 2:35 PM  [place], [place], [address] Phone: [phone]   Fax:  [phone]  Name: [name] MRN: [id number] Date of Birth: [birth year]

## 2019-02-05 ENCOUNTER — Ambulatory Visit: Payer: Medicare Other | Admitting: Physical Therapy

## 2019-02-08 ENCOUNTER — Encounter: Payer: Self-pay | Admitting: Physical Therapy

## 2019-02-08 ENCOUNTER — Other Ambulatory Visit: Payer: Self-pay

## 2019-02-08 ENCOUNTER — Ambulatory Visit: Payer: Medicare Other | Admitting: Physical Therapy

## 2019-02-08 DIAGNOSIS — M5412 Radiculopathy, cervical region: Secondary | ICD-10-CM

## 2019-02-08 DIAGNOSIS — R252 Cramp and spasm: Secondary | ICD-10-CM

## 2019-02-08 DIAGNOSIS — R262 Difficulty in walking, not elsewhere classified: Secondary | ICD-10-CM | POA: Diagnosis not present

## 2019-02-08 DIAGNOSIS — M542 Cervicalgia: Secondary | ICD-10-CM

## 2019-02-08 NOTE — Therapy (Signed)
[place], [place], [address] Phone: [phone]   Fax:  [phone]  Physical Therapy Treatment  Patient Details  Name: [name] MRN: [id number] Date of Birth: 1940/04/25 Referring Provider (PT): Elsner   Encounter Date: 02/08/2019  PT End of Session - 02/08/19 1139    Visit Number  8    Date for PT Re-Evaluation  03/22/19    Authorization Type  Medicare    PT Start Time  1050    PT Stop Time  1147    PT Time Calculation (min)  57 min    Activity Tolerance  Patient tolerated treatment well    Behavior During Therapy  [facility] for tasks assessed/performed       Past Medical History:  Diagnosis Date  . Acute blood loss anemia   . Acute encephalopathy 07/15/2016  . AKI (acute kidney injury) ([place])   . Anemia    has sickle cell trait  . Anxiety   . Arthritis   . Asthma    has used inhaler in past for asthmatic bronchitis, last time- early 2012  . Bilateral primary osteoarthritis of knee 03/26/2016  . Complication of anesthesia    wakes up shaking  . Diabetes mellitus   . Diabetes mellitus with neuropathy ([place]) 10/29/2012  . Diverticulitis   . Dyslipidemia   . Encephalopathy 06/2016   due to medications after surgery  . Fibromyalgia   . GERD (gastroesophageal reflux disease)    occas. use of  Prilosec  . Heart murmur    sees Dr. Montez Morita, last seen- early 2012  . Herniated nucleus pulposus, L2-3 06/25/2016  . History of back surgery   . Hypertension    02/2010- stress test /w PCP  . Hypothyroidism   . Loose bowel movements 12/2016  . Lumbar radiculopathy 06/27/2016  . Lumbar stenosis with neurogenic claudication 03/17/2017  . Memory disorder 02/16/2016  . Myoclonic jerking   . Neuromuscular disorder (HCC)    lumbar radiculopathy, lumbago  . Nocturnal leg cramps 09/27/2014  . Osteoporosis 03/10/2014  . Pneumonia   . Sickle cell trait ([place])   . Sleep apnea    borderline sleep  apnea, states she no longer uses, early 2012- stopped using   . Spinal stenosis of lumbar region with radiculopathy 02/28/2014  . Spondylolisthesis of lumbar region 03/19/2011  . Type II or unspecified type diabetes mellitus without mention of complication, uncontrolled 07/08/2013    Past Surgical History:  Procedure Laterality Date  . ABDOMINAL HYSTERECTOMY    . adb.cyst     ovarian cyst  . BACK SURGERY     2012, 2015 (3 total)  . BACK SURGERY  2018   02/24/2017  . COLONOSCOPY    . EYE SURGERY     macular degeneration treatment - injections  . FEMUR IM NAIL Left 06/13/2018   Procedure: INTRAMEDULLARY (IM) NAIL FEMORAL;  Surgeon: [name], MD;  [facility];  Service: Orthopedics;  Laterality: Left;  . OVARIAN CYST SURGERY    . POSTERIOR LUMBAR FUSION 4 LEVEL N/A 03/17/2017   Procedure: Decompression of Lumbar One-Two with Thoracic Ten to Lumbar Two Fusion;  Surgeon: Kristeen Miss, MD;  Location: [facility];  Service: Neurosurgery;  Laterality: N/A;  Decompression of L1-2 with T10 to L2 Fusion    There were no vitals filed for this visit.  Subjective Assessment - 02/08/19 1052    Subjective  "I just have asthma this morning that's  all"    Currently in Pain?  Yes    Pain Score  7     Pain Location  --   Back neck and R shoulder        [facility] PT Assessment - 02/08/19 0001      AROM   Overall AROM Comments  Cervical ROM is decreased 25% with c/o pain ext and R rotation, Shoulder ROM WFL's with tightness and pain                   [facility] Adult PT Treatment/Exercise - 02/08/19 0001      Neck Exercises: Machines for Strengthening   Other Machines for Strengthening  Rows & Lats 15lb 2x15       Neck Exercises: Standing   Other Standing Exercises  Shoulder Rows and Ext green 2x10     Other Standing Exercises  Shoulder ER yellow 2x15       Neck Exercises: Seated   Neck Retraction  20 reps;3 secs   ball on wall.      Knee/Hip Exercises: Aerobic    Nustep  level 4 x 7 minutes      Knee/Hip Exercises: Seated   Sit to Sand  1 set;10 reps;without UE support   from mat table      Moist Heat Therapy   Number Minutes Moist Heat  15 Minutes    Moist Heat Location  Cervical      Electrical Stimulation   Electrical Stimulation Location  neck right upper trap and into the rhomboid    Electrical Stimulation Action  IFC    Electrical Stimulation Parameters  siting    Electrical Stimulation Goals  Pain               PT Short Term Goals - 01/20/19 1426      PT SHORT TERM GOAL #1   Title  independent with initial HEP    Time  2    Period  Weeks    Status  New        PT Long Term Goals - 02/08/19 1100      PT LONG TERM GOAL #2   Title  get up from sitting without pain and without hands    Baseline  Can do but has pain    Status  Partially Met      PT LONG TERM GOAL #3   Title  increase cervical ROM 25%    Status  Achieved      PT LONG TERM GOAL #4   Title  report 50% less HA    Status  Partially Met            Plan - 02/08/19 1139    Clinical Impression Statement  Pt has progressed meeting some goals. She doe well functionally with sit to stands but does report pain while doing so. Some limitation with cervical ROM remains with pain mostly with extension and R rotation. Cues needed to get her to slow down with Tband ros and extensions and to focus on a good muscle contraction.    Comorbidities  DJD right knee, 5 lumbar surgeries, osteoporosis    Stability/Clinical Decision Making  Evolving/Moderate complexity    Rehab Potential  Good    PT Frequency  3x / week    PT Treatment/Interventions  ADLs/Self Care Home Management;Electrical Stimulation;Iontophoresis 58m/ml Dexamethasone;Moist Heat;Ultrasound;Functional mobility training;Stair training;Gait training;Therapeutic activities;Therapeutic exercise;Balance training;Neuromuscular re-education;Manual techniques;Patient/family education;Traction;Cryotherapy;Dry  needling    PT Next Visit Plan  Postural strengthening  and cervical ROM       Patient will benefit from skilled therapeutic intervention in order to improve the following deficits and impairments:  Pain, Postural dysfunction, Increased muscle spasms, Cardiopulmonary status limiting activity, Decreased activity tolerance, Decreased endurance, Decreased range of motion, Decreased strength, Decreased balance, Difficulty walking, Impaired flexibility, Improper body mechanics, Impaired UE functional use  Visit Diagnosis: Cervicalgia  Radiculopathy, cervical region  Cramp and spasm     Problem List Patient Active Problem List   Diagnosis Date Noted  . Fracture, proximal femur ([place]) 06/12/2018  . Femur fracture, left ([place]) 06/12/2018  . Acute blood loss as cause of postoperative anemia 03/30/2017  . Diabetic polyneuropathy associated with secondary diabetes mellitus ([place]) 03/30/2017  . Asthma 03/30/2017  . Dyslipidemia 03/26/2017  . Lumbar stenosis with neurogenic claudication 03/17/2017  . Spinal stenosis of lumbar region 03/03/2017  . Myoclonic jerking   . Nausea   . Acute encephalopathy 07/15/2016  . Lumbar radiculopathy 06/27/2016  . Hypothyroidism 06/26/2016  . Labile blood glucose   . Surgery, elective   . Post-operative pain   . Sickle cell trait ([place])   . Acute blood loss anemia   . History of back surgery   . AKI (acute kidney injury) ([place])   . Herniated nucleus pulposus, L2-3 06/25/2016  . Bilateral primary osteoarthritis of knee 03/26/2016  . Osteoarthritis, hand 03/26/2016  . Other insomnia 03/26/2016  . Fibromyalgia 09/27/2014  . Nocturnal leg cramps 09/27/2014  . Body mass index (BMI) of 30.0-30.9 in adult 08/04/2014  . Neuropathy 03/10/2014  . Allergic rhinitis 03/10/2014  . Osteoporosis 03/10/2014  . Spinal stenosis of lumbar region with radiculopathy 02/28/2014  . Type 2 diabetes mellitus with complication, with long-term current use of insulin ([place])  07/08/2013  . Hypokalemia 11/02/2012  . Essential hypertension, benign 11/02/2012  . Diabetes mellitus with neuropathy ([place]) 10/29/2012  . Hyperlipidemia 10/29/2012  . Spondylolisthesis of lumbar region 03/19/2011  . Lumbar radicular pain 03/19/2011    Scot Jun, PTA 02/08/2019, 11:41 AM  [address] [place], [place], [address] Phone: [phone]   Fax:  [phone]  Name: RALIYAH MONTELLA MRN: [id number] Date of Birth: November 27, 1940

## 2019-02-09 ENCOUNTER — Other Ambulatory Visit: Payer: Medicare Other

## 2019-02-09 ENCOUNTER — Other Ambulatory Visit (INDEPENDENT_AMBULATORY_CARE_PROVIDER_SITE_OTHER): Payer: Medicare Other

## 2019-02-09 DIAGNOSIS — E1165 Type 2 diabetes mellitus with hyperglycemia: Secondary | ICD-10-CM

## 2019-02-09 DIAGNOSIS — E063 Autoimmune thyroiditis: Secondary | ICD-10-CM | POA: Diagnosis not present

## 2019-02-09 DIAGNOSIS — Z794 Long term (current) use of insulin: Secondary | ICD-10-CM

## 2019-02-09 DIAGNOSIS — E782 Mixed hyperlipidemia: Secondary | ICD-10-CM

## 2019-02-09 LAB — URINALYSIS, ROUTINE W REFLEX MICROSCOPIC
Bilirubin Urine: NEGATIVE
Hgb urine dipstick: NEGATIVE
Ketones, ur: NEGATIVE
Leukocytes,Ua: NEGATIVE
Nitrite: NEGATIVE
RBC / HPF: NONE SEEN (ref 0–?)
Specific Gravity, Urine: 1.01 (ref 1.000–1.030)
Total Protein, Urine: NEGATIVE
Urine Glucose: >=1000 — AB
Urobilinogen, UA: 0.2 (ref 0.0–1.0)
pH: 6 (ref 5.0–8.0)

## 2019-02-09 LAB — COMPREHENSIVE METABOLIC PANEL
ALT: 7 U/L (ref 0–35)
AST: 12 U/L (ref 0–37)
Albumin: 4.2 g/dL (ref 3.5–5.2)
Alkaline Phosphatase: 115 U/L (ref 39–117)
BUN: 17 mg/dL (ref 6–23)
CO2: 30 mEq/L (ref 19–32)
Calcium: 9.2 mg/dL (ref 8.4–10.5)
Chloride: 105 mEq/L (ref 96–112)
Creatinine, Ser: 0.96 mg/dL (ref 0.40–1.20)
GFR: 67.81 mL/min (ref >60.00)
Glucose, Bld: 93 mg/dL (ref 70–99)
Potassium: 3.7 mEq/L (ref 3.5–5.1)
Sodium: 142 mEq/L (ref 135–145)
Total Bilirubin: 0.6 mg/dL (ref 0.2–1.2)
Total Protein: 6.8 g/dL (ref 6.0–8.3)

## 2019-02-09 LAB — TSH: TSH: 3.74 u[IU]/mL (ref 0.35–4.50)

## 2019-02-09 LAB — MICROALBUMIN / CREATININE URINE RATIO
Creatinine,U: 40.1 mg/dL
Microalb Creat Ratio: 12.1 mg/g (ref 0.0–30.0)
Microalb, Ur: 4.8 mg/dL — ABNORMAL HIGH (ref 0.0–1.9)

## 2019-02-09 LAB — T4, FREE: Free T4: 0.88 ng/dL (ref 0.60–1.60)

## 2019-02-09 LAB — HEMOGLOBIN A1C: Hgb A1c MFr Bld: 6.8 % — ABNORMAL HIGH (ref 4.6–6.5)

## 2019-02-09 LAB — LDL CHOLESTEROL, DIRECT: Direct LDL: 166 mg/dL

## 2019-02-10 ENCOUNTER — Other Ambulatory Visit: Payer: Self-pay

## 2019-02-10 ENCOUNTER — Ambulatory Visit: Payer: Medicare Other | Admitting: Physical Therapy

## 2019-02-10 ENCOUNTER — Encounter: Payer: Self-pay | Admitting: Physical Therapy

## 2019-02-10 DIAGNOSIS — R262 Difficulty in walking, not elsewhere classified: Secondary | ICD-10-CM | POA: Diagnosis not present

## 2019-02-10 DIAGNOSIS — M5412 Radiculopathy, cervical region: Secondary | ICD-10-CM

## 2019-02-10 DIAGNOSIS — R252 Cramp and spasm: Secondary | ICD-10-CM | POA: Diagnosis not present

## 2019-02-10 DIAGNOSIS — M542 Cervicalgia: Secondary | ICD-10-CM | POA: Diagnosis not present

## 2019-02-10 NOTE — Therapy (Signed)
[place], [place], [address] Phone: [phone]   Fax:  [phone]  Physical Therapy Treatment  Patient Details  Name: [name] MRN: [id number] Date of Birth: Sep 02, 1940 Referring Provider (PT): Elsner   Encounter Date: 02/10/2019  PT End of Session - 02/10/19 7048    Visit Number  9    Date for PT Re-Evaluation  03/22/19    Authorization Type  Medicare    PT Start Time  1450    PT Stop Time  1543    PT Time Calculation (min)  53 min    Activity Tolerance  Patient limited by fatigue    Behavior During Therapy  [facility] for tasks assessed/performed       Past Medical History:  Diagnosis Date  . Acute blood loss anemia   . Acute encephalopathy 07/15/2016  . AKI (acute kidney injury) ([place])   . Anemia    has sickle cell trait  . Anxiety   . Arthritis   . Asthma    has used inhaler in past for asthmatic bronchitis, last time- early 2012  . Bilateral primary osteoarthritis of knee 03/26/2016  . Complication of anesthesia    wakes up shaking  . Diabetes mellitus   . Diabetes mellitus with neuropathy ([place]) 10/29/2012  . Diverticulitis   . Dyslipidemia   . Encephalopathy 06/2016   due to medications after surgery  . Fibromyalgia   . GERD (gastroesophageal reflux disease)    occas. use of  Prilosec  . Heart murmur    sees Dr. Montez Morita, last seen- early 2012  . Herniated nucleus pulposus, L2-3 06/25/2016  . History of back surgery   . Hypertension    02/2010- stress test /w PCP  . Hypothyroidism   . Loose bowel movements 12/2016  . Lumbar radiculopathy 06/27/2016  . Lumbar stenosis with neurogenic claudication 03/17/2017  . Memory disorder 02/16/2016  . Myoclonic jerking   . Neuromuscular disorder (HCC)    lumbar radiculopathy, lumbago  . Nocturnal leg cramps 09/27/2014  . Osteoporosis 03/10/2014  . Pneumonia   . Sickle cell trait ([place])   . Sleep apnea    borderline sleep apnea,  states she no longer uses, early 2012- stopped using   . Spinal stenosis of lumbar region with radiculopathy 02/28/2014  . Spondylolisthesis of lumbar region 03/19/2011  . Type II or unspecified type diabetes mellitus without mention of complication, uncontrolled 07/08/2013    Past Surgical History:  Procedure Laterality Date  . ABDOMINAL HYSTERECTOMY    . adb.cyst     ovarian cyst  . BACK SURGERY     2012, 2015 (3 total)  . BACK SURGERY  2018   02/24/2017  . COLONOSCOPY    . EYE SURGERY     macular degeneration treatment - injections  . FEMUR IM NAIL Left 06/13/2018   Procedure: INTRAMEDULLARY (IM) NAIL FEMORAL;  Surgeon: [name], MD;  [facility];  Service: Orthopedics;  Laterality: Left;  . OVARIAN CYST SURGERY    . POSTERIOR LUMBAR FUSION 4 LEVEL N/A 03/17/2017   Procedure: Decompression of Lumbar One-Two with Thoracic Ten to Lumbar Two Fusion;  Surgeon: Kristeen Miss, MD;  Location: [facility];  Service: Neurosurgery;  Laterality: N/A;  Decompression of L1-2 with T10 to L2 Fusion    There were no vitals filed for this visit.  Subjective Assessment - 02/10/19 1453    Subjective  I just hurt all the time  Currently in Pain?  Yes    Pain Score  7     Pain Location  Back    Aggravating Factors   any activity and walking will increase the pain                       [facility] Adult PT Treatment/Exercise - 02/10/19 0001      Ambulation/Gait   Gait Comments  used SPC, some light walking outside x 100 feet      Neck Exercises: Standing   Other Standing Exercises  back to wall lifting a small ball over head working on posture, W backs    Other Standing Exercises  Shoulder ER yellow 2x15       Neck Exercises: Seated   Shoulder Rolls  Backwards;10 reps    Other Seated Exercise  rows and extension with red tband  2 x10    Other Seated Exercise  ball in lap isometric abs      Knee/Hip Exercises: Aerobic   Nustep  level 4 x 7 minutes      Knee/Hip  Exercises: Seated   Long Arc Quad  Left;2 sets;10 reps    Other Seated Knee/Hip Exercises  anterior tib exercises, some pelvic mobility    Marching  Both;2 sets;10 reps      Moist Heat Therapy   Number Minutes Moist Heat  15 Minutes    Moist Heat Location  Cervical      Electrical Stimulation   Electrical Stimulation Location  neck right upper trap and into the rhomboid    Electrical Stimulation Action  IFC    Electrical Stimulation Parameters  sitting    Electrical Stimulation Goals  Pain               PT Short Term Goals - 01/20/19 1426      PT SHORT TERM GOAL #1   Title  independent with initial HEP    Time  2    Period  Weeks    Status  New        PT Long Term Goals - 02/10/19 1647      PT LONG TERM GOAL #1   Title  decrease pain 50%    Status  Partially Met      PT LONG TERM GOAL #2   Title  get up from sitting without pain and without hands    Status  Partially Met            Plan - 02/10/19 1646    Clinical Impression Statement  Patient comes in frustrated about just feeling tired and sore all the time, reports that she jsut isn't herself.  Once we start doing exercises she does report that she feels better, c/o a lack of energy, I backed off and tend some increased postural type exercises and general movements with cues  today    PT Next Visit Plan  Postural strengthening and cervical ROM    Consulted and Agree with Plan of Care  Patient       Patient will benefit from skilled therapeutic intervention in order to improve the following deficits and impairments:  Pain, Postural dysfunction, Increased muscle spasms, Cardiopulmonary status limiting activity, Decreased activity tolerance, Decreased endurance, Decreased range of motion, Decreased strength, Decreased balance, Difficulty walking, Impaired flexibility, Improper body mechanics, Impaired UE functional use  Visit Diagnosis: Cervicalgia  Radiculopathy, cervical region  Cramp and  spasm  Difficulty in walking, not elsewhere classified  Problem List Patient Active Problem List   Diagnosis Date Noted  . Fracture, proximal femur ([place]) 06/12/2018  . Femur fracture, left ([place]) 06/12/2018  . Acute blood loss as cause of postoperative anemia 03/30/2017  . Diabetic polyneuropathy associated with secondary diabetes mellitus ([place]) 03/30/2017  . Asthma 03/30/2017  . Dyslipidemia 03/26/2017  . Lumbar stenosis with neurogenic claudication 03/17/2017  . Spinal stenosis of lumbar region 03/03/2017  . Myoclonic jerking   . Nausea   . Acute encephalopathy 07/15/2016  . Lumbar radiculopathy 06/27/2016  . Hypothyroidism 06/26/2016  . Labile blood glucose   . Surgery, elective   . Post-operative pain   . Sickle cell trait ([place])   . Acute blood loss anemia   . History of back surgery   . AKI (acute kidney injury) ([place])   . Herniated nucleus pulposus, L2-3 06/25/2016  . Bilateral primary osteoarthritis of knee 03/26/2016  . Osteoarthritis, hand 03/26/2016  . Other insomnia 03/26/2016  . Fibromyalgia 09/27/2014  . Nocturnal leg cramps 09/27/2014  . Body mass index (BMI) of 30.0-30.9 in adult 08/04/2014  . Neuropathy 03/10/2014  . Allergic rhinitis 03/10/2014  . Osteoporosis 03/10/2014  . Spinal stenosis of lumbar region with radiculopathy 02/28/2014  . Type 2 diabetes mellitus with complication, with long-term current use of insulin ([place]) 07/08/2013  . Hypokalemia 11/02/2012  . Essential hypertension, benign 11/02/2012  . Diabetes mellitus with neuropathy ([place]) 10/29/2012  . Hyperlipidemia 10/29/2012  . Spondylolisthesis of lumbar region 03/19/2011  . Lumbar radicular pain 03/19/2011    Sumner Boast., PT 02/10/2019, 4:48 PM  [place], [place], [address] Phone: [phone]   Fax:  [phone]  Name: [name] MRN: [id number] Date of Birth: [birth year]

## 2019-02-11 ENCOUNTER — Ambulatory Visit (INDEPENDENT_AMBULATORY_CARE_PROVIDER_SITE_OTHER): Payer: Medicare Other | Admitting: Endocrinology

## 2019-02-11 ENCOUNTER — Encounter: Payer: Self-pay | Admitting: Endocrinology

## 2019-02-11 VITALS — BP 140/66 | HR 65 | Ht 65.0 in | Wt 179.0 lb

## 2019-02-11 DIAGNOSIS — E1165 Type 2 diabetes mellitus with hyperglycemia: Secondary | ICD-10-CM

## 2019-02-11 DIAGNOSIS — Z23 Encounter for immunization: Secondary | ICD-10-CM

## 2019-02-11 DIAGNOSIS — E782 Mixed hyperlipidemia: Secondary | ICD-10-CM

## 2019-02-11 DIAGNOSIS — I10 Essential (primary) hypertension: Secondary | ICD-10-CM | POA: Diagnosis not present

## 2019-02-11 DIAGNOSIS — Z794 Long term (current) use of insulin: Secondary | ICD-10-CM | POA: Diagnosis not present

## 2019-02-11 DIAGNOSIS — E063 Autoimmune thyroiditis: Secondary | ICD-10-CM | POA: Diagnosis not present

## 2019-02-11 NOTE — Patient Instructions (Addendum)
Tresiba 22 units daily steady  3-4 Novolog for ice cream  Check blood sugars on waking up 3 days a week  Also check blood sugars about 2 hours after meals and do this after different meals by rotation  Recommended blood sugar levels on waking up are 90-130 and about 2 hours after meal is 130-180  Please bring your blood sugar monitor to each visit, thank you

## 2019-02-11 NOTE — Progress Notes (Signed)
Patient ID: [name], female   DOB: 21-Oct-1940, 78 y.o.   MRN: [id number]      Reason for Appointment: Endocrinology follow-up  History of Present Illness    PROBLEM 1: Type 2 diabetes mellitus, date of diagnosis: 1998.   Prior history: She had previously been treated with Byetta, Glumetza, Amaryl, Victoza and  Onglyza However because of inadequate control and intolerance to drugs she was finally given pre-meal insulin along with Amaryl, Glumetza eventually stopped because of side effects and also Lantus added  Recent history:  The insulin regimen is: Novolog 6 at breakfast and 9 lunch, 8-10 acs Tresiba 22 units at lunch  Oral agents: Farxiga 5 mg daily  Her A1c is now 6.8, has been mostly between about 6.2 -7.4  Current blood sugar patterns and problems with management:  She has checked blood sugars mostly in the mornings as before  She has a few high readings when she is eating sweets like ice cream or other snacks without any coverage  Has a couple of readings after supper at night only and these are on the high side  She is having excellent readings in the mornings although not clear why she sometimes takes 24 units of Tresiba instead of 22  As before she may feel slightly hypoglycemic when blood sugars are low normal which is infrequent  Recently has started more walking  Weight is stable  No side effects with Farxiga  Blood sugar patterns are as below, on an average blood sugars are fairly stable variation only in the evenings and afternoons based on her diet  Side effects from diabetes medications: Victoza caused nausea.  Metformin  Monitors blood glucose: 1-2 times a day, readings and MEDIAN numbers as below Glucometer: One Touch ultra 2.     PRE-MEAL Fasting Lunch Dinner Bedtime Overall  Glucose range:  97-143   72-225   72-228  Mean/median:  110     125   POST-MEAL PC Breakfast PC Lunch PC Dinner  Glucose range:   116, 228  171-194   Mean/median:    180   Previous blood sugar download results:  PRE-MEAL Fasting Lunch Dinner Bedtime Overall  Glucose range:  82-143  97-263  95-229    Mean/median:  108  140    109   POST-MEAL PC Breakfast PC Lunch PC Dinner  Glucose range:    124-199  Mean/median:        Meals: 2-3 meals per day at 10 am. 2 pm and 6-7 pm but inconsistent schedule.  Breakfast may be [place] sausage and egg with toast, occasionally oatmeal or cereal   Physical activity: exercise: Minimal, previously doing water aerobics 3/7 days a week  Certified Diabetes Educator visit: Most recent: 7/13.  Dietician visit: Most recent: 3/13.   Wt Readings from Last 3 Encounters:  02/11/19 179 lb (81.2 kg)  01/18/19 177 lb (80.3 kg)  11/06/18 178 lb 9.6 oz (81 kg)   Complications: Neuropathy  LABS:  Lab Results  Component Value Date   HGBA1C 6.8 (H) 02/09/2019   HGBA1C 6.4 10/14/2018   HGBA1C 7.3 (H) 04/23/2018   Lab Results  Component Value Date   MICROALBUR 4.8 (H) 02/09/2019   LDLCALC 169 (H) 10/14/2018   CREATININE 0.96 02/09/2019   Lab Results  Component Value Date   FRUCTOSAMINE 283 06/09/2018   FRUCTOSAMINE 296 (H) 05/16/2017   FRUCTOSAMINE 322 (H) 10/10/2016    Multiple other issues are addressed in review of systems  Lab on 02/09/2019  Component Date Value Ref Range Status  . Free T4 02/09/2019 0.88  0.60 - 1.60 ng/dL Final   Comment: Specimens from patients who are undergoing biotin therapy and /or ingesting biotin supplements may contain high levels of biotin.  The higher biotin concentration in these specimens interferes with this Free T4 assay.  Specimens that contain high levels  of biotin may cause false high results for this Free T4 assay.  Please interpret results in light of the total clinical presentation of the patient.    Marland Kitchen TSH 02/09/2019 3.74  0.35 - 4.50 uIU/mL Final  . Direct LDL 02/09/2019 166.0  mg/dL Final   Optimal:  <100 mg/dLNear or Above Optimal:  100-129  mg/dLBorderline High:  130-159 mg/dLHigh:  160-189 mg/dLVery High:  >190 mg/dL  . Sodium 02/09/2019 142  135 - 145 mEq/L Final  . Potassium 02/09/2019 3.7  3.5 - 5.1 mEq/L Final  . Chloride 02/09/2019 105  96 - 112 mEq/L Final  . CO2 02/09/2019 30  19 - 32 mEq/L Final  . Glucose, Bld 02/09/2019 93  70 - 99 mg/dL Final  . BUN 02/09/2019 17  6 - 23 mg/dL Final  . Creatinine, Ser 02/09/2019 0.96  0.40 - 1.20 mg/dL Final  . Total Bilirubin 02/09/2019 0.6  0.2 - 1.2 mg/dL Final  . Alkaline Phosphatase 02/09/2019 115  39 - 117 U/L Final  . AST 02/09/2019 12  0 - 37 U/L Final  . ALT 02/09/2019 7  0 - 35 U/L Final  . Total Protein 02/09/2019 6.8  6.0 - 8.3 g/dL Final  . Albumin 02/09/2019 4.2  3.5 - 5.2 g/dL Final  . Calcium 02/09/2019 9.2  8.4 - 10.5 mg/dL Final  . GFR 02/09/2019 67.81  >60.00 mL/min Final  . Hgb A1c MFr Bld 02/09/2019 6.8* 4.6 - 6.5 % Final   Glycemic Control Guidelines for People with Diabetes:Non Diabetic:  <6%Goal of Therapy: <7%Additional Action Suggested:  >8%   . Color, Urine 02/09/2019 YELLOW  Yellow;Lt. Yellow;Straw;Dark Yellow;Amber;Green;Red;Brown Final  . APPearance 02/09/2019 CLEAR  Clear;Turbid;Slightly Cloudy;Cloudy Final  . Specific Gravity, Urine 02/09/2019 1.010  1.000 - 1.030 Final  . pH 02/09/2019 6.0  5.0 - 8.0 Final  . Total Protein, Urine 02/09/2019 NEGATIVE  Negative Final  . Urine Glucose 02/09/2019 >=1000* Negative Final  . Ketones, ur 02/09/2019 NEGATIVE  Negative Final  . Bilirubin Urine 02/09/2019 NEGATIVE  Negative Final  . Hgb urine dipstick 02/09/2019 NEGATIVE  Negative Final  . Urobilinogen, UA 02/09/2019 0.2  0.0 - 1.0 Final  . Leukocytes,Ua 02/09/2019 NEGATIVE  Negative Final  . Nitrite 02/09/2019 NEGATIVE  Negative Final  . WBC, UA 02/09/2019 3-6/hpf* 0-2/hpf Final  . RBC / HPF 02/09/2019 none seen  0-2/hpf Final  . Squamous Epithelial / LPF 02/09/2019 Rare(0-4/hpf)  Rare(0-4/hpf) Final  . Bacteria, UA 02/09/2019 Rare(<10/hpf)* None Final   . Microalb, Ur 02/09/2019 4.8* 0.0 - 1.9 mg/dL Final  . Creatinine,U 02/09/2019 40.1  mg/dL Final  . Microalb Creat Ratio 02/09/2019 12.1  0.0 - 30.0 mg/g Final      Allergies as of 02/11/2019      Reactions   Cymbalta [duloxetine Hcl] Diarrhea, Other (See Comments)   Dizziness, headache, irritability   Baclofen    jerks    Betadine [povidone Iodine] Swelling, Other (See Comments)   SWELLING REACTION DESCRIPTION/SEVERITY UNSPECIFIED  Reaction to betadine eye drops   Adhesive [tape] Rash   Lipitor [atorvastatin] Rash      Medication List  Accurate as of February 11, 2019 11:59 PM. If you have any questions, ask your nurse or doctor.        acetaminophen 500 MG tablet Commonly known as: TYLENOL Take 1,000 mg by mouth every 8 (eight) hours as needed for mild pain or moderate pain.   amLODipine 10 MG tablet Commonly known as: NORVASC Take 1 tablet (10 mg total) by mouth daily.   cetirizine 10 MG tablet Commonly known as: ZYRTEC Take 1 tablet (10 mg total) by mouth daily as needed for allergies.   Farxiga 5 MG Tabs tablet Generic drug: dapagliflozin propanediol TAKE 1 TABLET BY MOUTH DAILY   fluticasone 50 MCG/ACT nasal spray Commonly known as: FLONASE Place 1 spray into both nostrils daily as needed for allergies.   gabapentin 600 MG tablet Commonly known as: NEURONTIN TAKE 1 TABLET(600 MG) BY MOUTH THREE TIMES DAILY   glucose blood test strip Commonly known as: ONE TOUCH ULTRA TEST Use as instructed to check blood sugar 4 times daily. DX:E11.65   HYDROcodone-acetaminophen 5-325 MG tablet Commonly known as: NORCO/VICODIN Take 1 tablet by mouth every 6 (six) hours as needed for moderate pain.   ibuprofen 800 MG tablet Commonly known as: ADVIL Take 1 tablet (800 mg total) by mouth every 8 (eight) hours as needed.   Insulin Pen Needle 32G X 4 MM Misc Commonly known as: BD Pen Needle Nano U/F USE AS DIRECTED   irbesartan 300 MG tablet Commonly known  as: AVAPRO TK 1 T PO QD   levothyroxine 50 MCG tablet Commonly known as: SYNTHROID Take 1 tablet (50 mcg total) by mouth daily.   MAGNESIUM PO Take 250 mg by mouth 2 (two) times daily.   meloxicam 15 MG tablet Commonly known as: MOBIC TK 1 T PO QD PRN   NovoLOG FlexPen 100 UNIT/ML FlexPen Generic drug: insulin aspart Inject 4 units under the skin before breakfast, 9 units at lunch, and 7 units at dinner. What changed: additional instructions   Symbicort 80-4.5 MCG/ACT inhaler Generic drug: budesonide-formoterol Inhale 2 puffs into the lungs 2 (two) times daily as needed (shortness of breath).   Tyler Aas FlexTouch 100 UNIT/ML Sopn FlexTouch Pen Generic drug: insulin degludec INJECT 24 UNITS UNDER THE SKIN EVERY DAY       Allergies:  Allergies  Allergen Reactions  . Cymbalta [Duloxetine Hcl] Diarrhea and Other (See Comments)    Dizziness, headache, irritability  . Baclofen     jerks   . Betadine [Povidone Iodine] Swelling and Other (See Comments)    SWELLING REACTION DESCRIPTION/SEVERITY UNSPECIFIED  Reaction to betadine eye drops  . Adhesive [Tape] Rash  . Lipitor [Atorvastatin] Rash    Past Medical History:  Diagnosis Date  . Acute blood loss anemia   . Acute encephalopathy 07/15/2016  . AKI (acute kidney injury) ([place])   . Anemia    has sickle cell trait  . Anxiety   . Arthritis   . Asthma    has used inhaler in past for asthmatic bronchitis, last time- early 2012  . Bilateral primary osteoarthritis of knee 03/26/2016  . Complication of anesthesia    wakes up shaking  . Diabetes mellitus   . Diabetes mellitus with neuropathy ([place]) 10/29/2012  . Diverticulitis   . Dyslipidemia   . Encephalopathy 06/2016   due to medications after surgery  . Fibromyalgia   . GERD (gastroesophageal reflux disease)    occas. use of  Prilosec  . Heart murmur    sees Dr. Montez Morita, last seen-  early 2012  . Herniated nucleus pulposus, L2-3 06/25/2016  . History of back surgery    . Hypertension    02/2010- stress test /w PCP  . Hypothyroidism   . Loose bowel movements 12/2016  . Lumbar radiculopathy 06/27/2016  . Lumbar stenosis with neurogenic claudication 03/17/2017  . Memory disorder 02/16/2016  . Myoclonic jerking   . Neuromuscular disorder (HCC)    lumbar radiculopathy, lumbago  . Nocturnal leg cramps 09/27/2014  . Osteoporosis 03/10/2014  . Pneumonia   . Sickle cell trait ([place])   . Sleep apnea    borderline sleep apnea, states she no longer uses, early 2012- stopped using   . Spinal stenosis of lumbar region with radiculopathy 02/28/2014  . Spondylolisthesis of lumbar region 03/19/2011  . Type II or unspecified type diabetes mellitus without mention of complication, uncontrolled 07/08/2013    Past Surgical History:  Procedure Laterality Date  . ABDOMINAL HYSTERECTOMY    . adb.cyst     ovarian cyst  . BACK SURGERY     2012, 2015 (3 total)  . BACK SURGERY  2018   02/24/2017  . COLONOSCOPY    . EYE SURGERY     macular degeneration treatment - injections  . FEMUR IM NAIL Left 06/13/2018   Procedure: INTRAMEDULLARY (IM) NAIL FEMORAL;  Surgeon: [name], MD;  Location: [facility];  Service: Orthopedics;  Laterality: Left;  . OVARIAN CYST SURGERY    . POSTERIOR LUMBAR FUSION 4 LEVEL N/A 03/17/2017   Procedure: Decompression of Lumbar One-Two with Thoracic Ten to Lumbar Two Fusion;  Surgeon: Kristeen Miss, MD;  Location: [facility];  Service: Neurosurgery;  Laterality: N/A;  Decompression of L1-2 with T10 to L2 Fusion    Family History  Problem Relation Age of Onset  . Ovarian cancer Mother   . Cancer - Prostate Father   . Breast cancer Paternal Aunt   . Multiple myeloma Paternal Aunt   . Anesthesia problems Neg Hx   . Hypotension Neg Hx   . Malignant hyperthermia Neg Hx   . Pseudochol deficiency Neg Hx     Social History:  reports that she quit smoking about 15 years ago. Her smoking use included cigarettes. She has a 3.00 pack-year smoking  history. She has never used smokeless tobacco. She reports current alcohol use. She reports that she does not use drugs.  ROS    NEUROPATHY: She has had tingling in feet and legs especially at night. She is taking gabapentin without consistent relief at night Also she will take Lyrica with somewhat better relief at night This was also prescribed as needed for fibromyalgia by her rheumatologist  Hyperlipidemia:   The lipid abnormality consists of elevated LDL mostly Did not tolerate Crestor or lovastatin because of muscle cramps  Livalo is not covered currently  She is off Zetia, previously did think she was having muscle cramps  Her lipids are poorly controlled without any treatment   Lab Results  Component Value Date   CHOL 250 (H) 10/14/2018   HDL 51.40 10/14/2018   LDLCALC 169 (H) 10/14/2018   LDLDIRECT 166.0 02/09/2019   TRIG 147.0 10/14/2018   CHOLHDL 5 10/14/2018      HYPERTENSION:  Has been present for several years.  This is usually controlled with taking Avapro 300 and also amlodipine from her PCP She is asking about taking HCTZ but she previously had muscle cramps with this  BP Readings from Last 3 Encounters:  02/11/19 140/66  01/18/19 119/74  11/06/18 Marland Kitchen)  148/82     POTASSIUM levels: normal . Has a history of leg and muscle cramps which are better with magnesium supplements    Lab Results  Component Value Date   CREATININE 0.96 02/09/2019   BUN 17 02/09/2019   NA 142 02/09/2019   K 3.7 02/09/2019   CL 105 02/09/2019   CO2 30 02/09/2019       Hypothyroidism  She had a relatively high TSH as of 4/17 and was empirically given Synthroid 25 g Subsequently has been on 50 mcg  Does not complain of feeling tired Without any change in her TSH is slightly lower than in June  TSH history as follows   Lab Results  Component Value Date   TSH 3.74 02/09/2019   TSH 4.85 (H) 10/14/2018   TSH 3.54 06/09/2018   FREET4 0.88 02/09/2019   FREET4 0.97  10/14/2018   FREET4 0.78 02/22/2016         Examination:   BP 140/66 (BP Location: Left Arm, Patient Position: Sitting, Cuff Size: Normal)   Pulse 65   Ht '5\' 5"'  (1.651 m)   Wt 179 lb (81.2 kg)   LMP  (LMP Unknown)   SpO2 97%   BMI 29.79 kg/m   Body mass index is 29.79 kg/m.     Assesment/Plan:   1. DIABETES type 2   See history of present illness for detailed discussion of management, blood sugar patterns and problems identified  She is on basal bolus insulin and Farxiga  A1c is stable at 6.8 although has been as low as 6.4  Considering her age and duration of diabetes her blood sugar control is excellent Only has sporadic high readings when she is eating desserts are snacks like ice cream Was not enough blood sugars are being done after supper to help adjust her mealtime dose Fasting readings are excellent although occasionally low normal   Overall benefiting from [place] with stable blood sugars and weight Recently also starting to do a little more walking  Recommended that she do the following Take a steady dose of 22 units Tresiba every day unless morning sugars are consistently out of range Adjust her mealtime dose based on 2-hour blood sugars especially at night Additional 3-4 units NovoLog for large carbohydrate snack or ice cream  3. Hypertension: Blood pressure is relatively well controlled on multiple drugs No microalbuminuria  No change recommended  4.   Hypothyroidism: She is taking 50 mcg levothyroxine and subjectively doing well She will continue the same dose since TSH is not rising  LIPIDS: She will see if Chanda Busing is covered in January with her new insurance Influenza vaccine given, patient handout given  History of fatty liver: Patient has questions about this.  Discussed that she is showing some mild abnormalities of her last ultrasound but this is not significant and not associated with any liver function abnormalities No specific treatment  needed and she does need to continue efforts to lose weight Do not think follow-up ultrasound will change our approach  Follow-up in 3 months  Total visit time for evaluation and management of multiple problems and counseling =25 minutes   Patient Instructions  Tresiba 22 units daily steady  3-4 Novolog for ice cream  Check blood sugars on waking up 3 days a week  Also check blood sugars about 2 hours after meals and do this after different meals by rotation  Recommended blood sugar levels on waking up are 90-130 and about 2 hours after meal is  130-180  Please bring your blood sugar monitor to each visit, thank you          [name] 02/12/2019, 9:36 AM

## 2019-02-12 ENCOUNTER — Telehealth: Payer: Self-pay | Admitting: Endocrinology

## 2019-02-12 ENCOUNTER — Other Ambulatory Visit: Payer: Self-pay

## 2019-02-12 ENCOUNTER — Encounter: Payer: Self-pay | Admitting: Physical Therapy

## 2019-02-12 ENCOUNTER — Ambulatory Visit: Payer: Medicare Other | Admitting: Physical Therapy

## 2019-02-12 DIAGNOSIS — M5412 Radiculopathy, cervical region: Secondary | ICD-10-CM | POA: Diagnosis not present

## 2019-02-12 DIAGNOSIS — R252 Cramp and spasm: Secondary | ICD-10-CM | POA: Diagnosis not present

## 2019-02-12 DIAGNOSIS — M542 Cervicalgia: Secondary | ICD-10-CM | POA: Diagnosis not present

## 2019-02-12 DIAGNOSIS — R262 Difficulty in walking, not elsewhere classified: Secondary | ICD-10-CM | POA: Diagnosis not present

## 2019-02-12 NOTE — Therapy (Signed)
[place], [place], [address] Phone: [phone]   Fax:  [phone]  Physical Therapy Treatment  Patient Details  Name: [name] MRN: [id number] Date of Birth: 1940/06/08 Referring Provider (PT): Els[place]   Encounter Date: 02/12/2019  PT End of Session - 02/12/19 1142    Visit Number  10    Date for PT Re-Evaluation  03/22/19    Authorization Type  Medicare    PT Start Time  1106    PT Stop Time  1156    PT Time Calculation (min)  50 min    Activity Tolerance  Patient tolerated treatment well    Behavior During Therapy  [facility] for tasks assessed/performed       Past Medical History:  Diagnosis Date  . Acute blood loss anemia   . Acute encephalopathy 07/15/2016  . AKI (acute kidney injury) ([place])   . Anemia    has sickle cell trait  . Anxiety   . Arthritis   . Asthma    has used inhaler in past for asthmatic bronchitis, last time- early 2012  . Bilateral primary osteoarthritis of knee 03/26/2016  . Complication of anesthesia    wakes up shaking  . Diabetes mellitus   . Diabetes mellitus with neuropathy ([address] [address]) 10/29/2012  . Diverticulitis   . Dyslipidemia   . Encephalopathy 06/2016   due to medications after surgery  . Fibromyalgia   . GERD (gastroesophageal reflux disease)    occas. use of  Prilosec  . Heart murmur    sees Dr. Montez Morita, last seen- early 2012  . Herniated nucleus pulposus, L2-3 06/25/2016  . History of back surgery   . Hypertension    02/2010- stress test /w PCP  . Hypothyroidism   . Loose bowel movements 12/2016  . Lumbar radiculopathy 06/27/2016  . Lumbar stenosis with neurogenic claudication 03/17/2017  . Memory disorder 02/16/2016  . Myoclonic jerking   . Neuromuscular disorder (HCC)    lumbar radiculopathy, lumbago  . Nocturnal leg cramps 09/27/2014  . Osteoporosis 03/10/2014  . Pneumonia   . Sickle cell trait ([place])   . Sleep apnea    borderline sleep  apnea, states she no longer uses, early 2012- stopped using   . Spinal stenosis of lumbar region with radiculopathy 02/28/2014  . Spondylolisthesis of lumbar region 03/19/2011  . Type II or unspecified type diabetes mellitus without mention of complication, uncontrolled 07/08/2013    Past Surgical History:  Procedure Laterality Date  . ABDOMINAL HYSTERECTOMY    . adb.cyst     ovarian cyst  . BACK SURGERY     2012, 2015 (3 total)  . BACK SURGERY  2018   02/24/2017  . COLONOSCOPY    . EYE SURGERY     macular degeneration treatment - injections  . FEMUR IM NAIL Left 06/13/2018   Procedure: INTRAMEDULLARY (IM) NAIL FEMORAL;  Surgeon: [name], MD;  Location[facility];  Service: Orthopedics;  Laterality: Left;  . OVARIAN CYST SURGERY    . POSTERIOR LUMBAR FUSION 4 LEVEL N/A 03/17/2017   Procedure: Decompression of Lumbar One-Two with Thoracic Ten to Lumbar Two Fusion;  Surgeon: Kristeen Miss, MD;  Location: [facility];  Service: Neurosurgery;  Laterality: N/A;  Decompression of L1-2 with T10 to L2 Fusion    There were no vitals filed for this visit.  Subjective Assessment - 02/12/19 1108    Subjective  "I am in good sprits" "My arm  is hurting cause I got my flu shot yesterday"    Currently in Pain?  Yes    Pain Score  7     Pain Location  Back                       [facility] Adult PT Treatment/Exercise - 02/12/19 0001      Neck Exercises: Standing   Other Standing Exercises  back to wall lifting a small ball over head working on posture, W backs    Other Standing Exercises  Shoulder ER yellow 2x15; Standing ext 2x15       Neck Exercises: Seated   Shoulder Rolls  Backwards;10 reps    Other Seated Exercise  rows red tband 2x15    Other Seated Exercise  ball in lap isometric abs      Knee/Hip Exercises: Aerobic   Nustep  level 4 x 7 minutes      Knee/Hip Exercises: Seated   Long Arc Quad  2 sets;10 reps;Both    Marching  Both;2 sets;10 reps      Moist Heat  Therapy   Number Minutes Moist Heat  15 Minutes    Moist Heat Location  Cervical      Electrical Stimulation   Electrical Stimulation Location  neck right upper trap and into the rhomboid    Electrical Stimulation Action  IFC    Electrical Stimulation Parameters  sitting    Electrical Stimulation Goals  Pain               PT Short Term Goals - 01/20/19 1426      PT SHORT TERM GOAL #1   Title  independent with initial HEP    Time  2    Period  Weeks    Status  New        PT Long Term Goals - 02/10/19 1647      PT LONG TERM GOAL #1   Title  decrease pain 50%    Status  Partially Met      PT LONG TERM GOAL #2   Title  get up from sitting without pain and without hands    Status  Partially Met            Plan - 02/12/19 1143    Clinical Impression Statement  Pt ~ 6 minutes late for today's treatment session. Continues with postural support interventions. Pt had the most difficulty with standing back against wall with overhead ball lifts. Cues to breath with seated ab sets. L quad soreness with LAQ and marches.    Comorbidities  DJD right knee, 5 lumbar surgeries, osteoporosis    Stability/Clinical Decision Making  Evolving/Moderate complexity    Rehab Potential  Good    PT Frequency  3x / week    PT Duration  8 weeks    PT Treatment/Interventions  ADLs/Self Care Home Management;Electrical Stimulation;Iontophoresis 71m/ml Dexamethasone;Moist Heat;Ultrasound;Functional mobility training;Stair training;Gait training;Therapeutic activities;Therapeutic exercise;Balance training;Neuromuscular re-education;Manual techniques;Patient/family education;Traction;Cryotherapy;Dry needling    PT Next Visit Plan  Postural strengthening and cervical ROM     Gave pt red tband for at home use with rows and ER  Patient will benefit from skilled therapeutic intervention in order to improve the following deficits and impairments:  Pain, Postural dysfunction, Increased muscle spasms,  Cardiopulmonary status limiting activity, Decreased activity tolerance, Decreased endurance, Decreased range of motion, Decreased strength, Decreased balance, Difficulty walking, Impaired flexibility, Improper body mechanics, Impaired UE functional use  Visit Diagnosis: Cramp  and spasm  Radiculopathy, cervical region  Cervicalgia     Problem List Patient Active Problem List   Diagnosis Date Noted  . Fracture, proximal femur ([place]) 06/12/2018  . Femur fracture, left ([place]) 06/12/2018  . Acute blood loss as cause of postoperative anemia 03/30/2017  . Diabetic polyneuropathy associated with secondary diabetes mellitus ([place]) 03/30/2017  . Asthma 03/30/2017  . Dyslipidemia 03/26/2017  . Lumbar stenosis with neurogenic claudication 03/17/2017  . Spinal stenosis of lumbar region 03/03/2017  . Myoclonic jerking   . Nausea   . Acute encephalopathy 07/15/2016  . Lumbar radiculopathy 06/27/2016  . Hypothyroidism 06/26/2016  . Labile blood glucose   . Surgery, elective   . Post-operative pain   . Sickle cell trait ([place])   . Acute blood loss anemia   . History of back surgery   . AKI (acute kidney injury) ([place])   . Herniated nucleus pulposus, L2-3 06/25/2016  . Bilateral primary osteoarthritis of knee 03/26/2016  . Osteoarthritis, hand 03/26/2016  . Other insomnia 03/26/2016  . Fibromyalgia 09/27/2014  . Nocturnal leg cramps 09/27/2014  . Body mass index (BMI) of 30.0-30.9 in adult 08/04/2014  . Neuropathy 03/10/2014  . Allergic rhinitis 03/10/2014  . Osteoporosis 03/10/2014  . Spinal stenosis of lumbar region with radiculopathy 02/28/2014  . Type 2 diabetes mellitus with complication, with long-term current use of insulin ([place]) 07/08/2013  . Hypokalemia 11/02/2012  . Essential hypertension, benign 11/02/2012  . Diabetes mellitus with neuropathy ([place]) 10/29/2012  . Hyperlipidemia 10/29/2012  . Spondylolisthesis of lumbar region 03/19/2011  . Lumbar radicular pain 03/19/2011     Scot Jun 02/12/2019, 11:45 AM  [place] [address] [place], [place], [address] Phone: [phone]   Fax:  [phone]  Name: [name] MRN: [id number] Date of Birth: 10-Jul-1940

## 2019-02-15 ENCOUNTER — Other Ambulatory Visit: Payer: Self-pay

## 2019-02-15 ENCOUNTER — Ambulatory Visit: Payer: Medicare Other | Admitting: Physical Therapy

## 2019-02-15 DIAGNOSIS — M542 Cervicalgia: Secondary | ICD-10-CM

## 2019-02-15 DIAGNOSIS — M5412 Radiculopathy, cervical region: Secondary | ICD-10-CM | POA: Diagnosis not present

## 2019-02-15 DIAGNOSIS — R262 Difficulty in walking, not elsewhere classified: Secondary | ICD-10-CM | POA: Diagnosis not present

## 2019-02-15 DIAGNOSIS — R252 Cramp and spasm: Secondary | ICD-10-CM | POA: Diagnosis not present

## 2019-02-15 NOTE — Therapy (Signed)
[place] [place], [place], [address] Phone: [phone]   Fax:  [phone]  Physical Therapy Treatment  Patient Details  Name: [name] MRN: [id number] Date of Birth: 05-30-1940 Referring Provider (PT): Elsner   Encounter Date: 02/15/2019  PT End of Session - 02/15/19 1422    Visit Number  11    PT Start Time  1346    PT Stop Time  1436    PT Time Calculation (min)  50 min       Past Medical History:  Diagnosis Date  . Acute blood loss anemia   . Acute encephalopathy 07/15/2016  . AKI (acute kidney injury) ([place])   . Anemia    has sickle cell trait  . Anxiety   . Arthritis   . Asthma    has used inhaler in past for asthmatic bronchitis, last time- early 2012  . Bilateral primary osteoarthritis of knee 03/26/2016  . Complication of anesthesia    wakes up shaking  . Diabetes mellitus   . Diabetes mellitus with neuropathy ([place]) 10/29/2012  . Diverticulitis   . Dyslipidemia   . Encephalopathy 06/2016   due to medications after surgery  . Fibromyalgia   . GERD (gastroesophageal reflux disease)    occas. use of  Prilosec  . Heart murmur    sees Dr. Montez Morita, last seen- early 2012  . Herniated nucleus pulposus, L2-3 06/25/2016  . History of back surgery   . Hypertension    02/2010- stress test /w PCP  . Hypothyroidism   . Loose bowel movements 12/2016  . Lumbar radiculopathy 06/27/2016  . Lumbar stenosis with neurogenic claudication 03/17/2017  . Memory disorder 02/16/2016  . Myoclonic jerking   . Neuromuscular disorder (HCC)    lumbar radiculopathy, lumbago  . Nocturnal leg cramps 09/27/2014  . Osteoporosis 03/10/2014  . Pneumonia   . Sickle cell trait ([place])   . Sleep apnea    borderline sleep apnea, states she no longer uses, early 2012- stopped using   . Spinal stenosis of lumbar region with radiculopathy 02/28/2014  . Spondylolisthesis of lumbar region 03/19/2011  . Type II or  unspecified type diabetes mellitus without mention of complication, uncontrolled 07/08/2013    Past Surgical History:  Procedure Laterality Date  . ABDOMINAL HYSTERECTOMY    . adb.cyst     ovarian cyst  . BACK SURGERY     2012, 2015 (3 total)  . BACK SURGERY  2018   02/24/2017  . COLONOSCOPY    . EYE SURGERY     macular degeneration treatment - injections  . FEMUR IM NAIL Left 06/13/2018   Procedure: INTRAMEDULLARY (IM) NAIL FEMORAL;  Surgeon: [name], MD;  Location: [facility];  Service: Orthopedics;  Laterality: Left;  . OVARIAN CYST SURGERY    . POSTERIOR LUMBAR FUSION 4 LEVEL N/A 03/17/2017   Procedure: Decompression of Lumbar One-Two with Thoracic Ten to Lumbar Two Fusion;  Surgeon: Kristeen Miss, MD;  Location: [facility];  Service: Neurosurgery;  Laterality: N/A;  Decompression of L1-2 with T10 to L2 Fusion    There were no vitals filed for this visit.  Subjective Assessment - 02/15/19 1346    Subjective  "feeling pretty good"    Currently in Pain?  Yes    Pain Score  6     Pain Location  Neck  [facility] Adult PT Treatment/Exercise - 02/15/19 0001      Ambulation/Gait   Ambulation/Gait  Yes    Ambulation/Gait Assistance  6: Modified independent (Device/Increase time)    Ambulation Distance (Feet)  100 Feet    Assistive device  Straight cane    Ambulation Surface  Indoor;Outdoor;Level;Paved;Unlevel      Neck Exercises: Standing   Other Standing Exercises  Shoulder ER red 2x15; Standing ext  red 2x15       Neck Exercises: Seated   Shoulder Rolls  Backwards;10 reps    Shoulder Rolls Limitations  2lb    Other Seated Exercise  rows red tband 2x15, shoulder shrugs 10 x 2    Other Seated Exercise  ball in lap isometric abs 2 x 10      Knee/Hip Exercises: Aerobic   Nustep  level 4 x 7 minutes      Moist Heat Therapy   Number Minutes Moist Heat  15 Minutes    Moist Heat Location  Cervical      Electrical Stimulation    Electrical Stimulation Location  neck right upper trap and into the rhomboid    Electrical Stimulation Action  IFC    Electrical Stimulation Parameters  sitting    Electrical Stimulation Goals  Pain               PT Short Term Goals - 01/20/19 1426      PT SHORT TERM GOAL #1   Title  independent with initial HEP    Time  2    Period  Weeks    Status  New        PT Long Term Goals - 02/10/19 1647      PT LONG TERM GOAL #1   Title  decrease pain 50%    Status  Partially Met      PT LONG TERM GOAL #2   Title  get up from sitting without pain and without hands    Status  Partially Met            Plan - 02/15/19 1425    Clinical Impression Statement  pt able to walk with and without SPC outside. Pt needs cues to increase a neutral posture. pt requires rest breaks between exercises secondary to fatigue.    Comorbidities  DJD right knee, 5 lumbar surgeries, osteoporosis    Rehab Potential  Good    PT Frequency  3x / week    PT Duration  8 weeks    PT Treatment/Interventions  ADLs/Self Care Home Management;Electrical Stimulation;Iontophoresis 8m/ml Dexamethasone;Moist Heat;Ultrasound;Functional mobility training;Stair training;Gait training;Therapeutic activities;Therapeutic exercise;Balance training;Neuromuscular re-education;Manual techniques;Patient/family education;Traction;Cryotherapy;Dry needling    PT Next Visit Plan  Postural strengthening and cervical ROM       Patient will benefit from skilled therapeutic intervention in order to improve the following deficits and impairments:  Pain, Postural dysfunction, Increased muscle spasms, Cardiopulmonary status limiting activity, Decreased activity tolerance, Decreased endurance, Decreased range of motion, Decreased strength, Decreased balance, Difficulty walking, Impaired flexibility, Improper body mechanics, Impaired UE functional use  Visit Diagnosis: Cervicalgia  Difficulty in walking, not elsewhere  classified     Problem List Patient Active Problem List   Diagnosis Date Noted  . Fracture, proximal femur ([place] 06/12/2018  . Femur fracture, left ([facility] 06/12/2018  . Acute blood loss as cause of postoperative anemia 03/30/2017  . Diabetic polyneuropathy associated with secondary diabetes mellitus ([facility] 03/30/2017  . Asthma 03/30/2017  . Dyslipidemia 03/26/2017  . Lumbar stenosis with neurogenic  claudication 03/17/2017  . Spinal stenosis of lumbar region 03/03/2017  . Myoclonic jerking   . Nausea   . Acute encephalopathy 07/15/2016  . Lumbar radiculopathy 06/27/2016  . Hypothyroidism 06/26/2016  . Labile blood glucose   . Surgery, elective   . Post-operative pain   . Sickle cell trait ([place])   . Acute blood loss anemia   . History of back surgery   . AKI (acute kidney injury) ([place])   . Herniated nucleus pulposus, L2-3 06/25/2016  . Bilateral primary osteoarthritis of knee 03/26/2016  . Osteoarthritis, hand 03/26/2016  . Other insomnia 03/26/2016  . Fibromyalgia 09/27/2014  . Nocturnal leg cramps 09/27/2014  . Body mass index (BMI) of 30.0-30.9 in adult 08/04/2014  . Neuropathy 03/10/2014  . Allergic rhinitis 03/10/2014  . Osteoporosis 03/10/2014  . Spinal stenosis of lumbar region with radiculopathy 02/28/2014  . Type 2 diabetes mellitus with complication, with long-term current use of insulin ([place]) 07/08/2013  . Hypokalemia 11/02/2012  . Essential hypertension, benign 11/02/2012  . Diabetes mellitus with neuropathy ([place]) 10/29/2012  . Hyperlipidemia 10/29/2012  . Spondylolisthesis of lumbar region 03/19/2011  . Lumbar radicular pain 03/19/2011    Barrett Henle, [place] 02/15/2019, 2:29 PM  [place], [place], [address] Phone: [phone]   Fax:  [phone]  Name: [name] MRN: [id number] Date of Birth: [birth year]

## 2019-02-16 ENCOUNTER — Ambulatory Visit: Payer: Medicare Other | Admitting: Physical Therapy

## 2019-02-16 ENCOUNTER — Encounter: Payer: Self-pay | Admitting: Physical Therapy

## 2019-02-16 DIAGNOSIS — M542 Cervicalgia: Secondary | ICD-10-CM

## 2019-02-16 DIAGNOSIS — R262 Difficulty in walking, not elsewhere classified: Secondary | ICD-10-CM | POA: Diagnosis not present

## 2019-02-16 DIAGNOSIS — R252 Cramp and spasm: Secondary | ICD-10-CM | POA: Diagnosis not present

## 2019-02-16 DIAGNOSIS — M5412 Radiculopathy, cervical region: Secondary | ICD-10-CM | POA: Diagnosis not present

## 2019-02-16 NOTE — Therapy (Signed)
[place], [place], [address] Phone: [phone]   Fax:  [phone]  Physical Therapy Treatment  Patient Details  Name: [name] MRN: [id number] Date of Birth: 03-29-1941 Referring Provider (PT): Elsner   Encounter Date: 02/16/2019  PT End of Session - 02/16/19 1224    Visit Number  12    Date for PT Re-Evaluation  03/22/19    Authorization Type  Medicare    PT Start Time  7989    PT Stop Time  1238    PT Time Calculation (min)  53 min    Activity Tolerance  Patient tolerated treatment well    Behavior During Therapy  [facility] for tasks assessed/performed       Past Medical History:  Diagnosis Date  . Acute blood loss anemia   . Acute encephalopathy 07/15/2016  . AKI (acute kidney injury) ([place])   . Anemia    has sickle cell trait  . Anxiety   . Arthritis   . Asthma    has used inhaler in past for asthmatic bronchitis, last time- early 2012  . Bilateral primary osteoarthritis of knee 03/26/2016  . Complication of anesthesia    wakes up shaking  . Diabetes mellitus   . Diabetes mellitus with neuropathy ([place]) 10/29/2012  . Diverticulitis   . Dyslipidemia   . Encephalopathy 06/2016   due to medications after surgery  . Fibromyalgia   . GERD (gastroesophageal reflux disease)    occas. use of  Prilosec  . Heart murmur    sees Dr. Montez Morita, last seen- early 2012  . Herniated nucleus pulposus, L2-3 06/25/2016  . History of back surgery   . Hypertension    02/2010- stress test /w PCP  . Hypothyroidism   . Loose bowel movements 12/2016  . Lumbar radiculopathy 06/27/2016  . Lumbar stenosis with neurogenic claudication 03/17/2017  . Memory disorder 02/16/2016  . Myoclonic jerking   . Neuromuscular disorder (HCC)    lumbar radiculopathy, lumbago  . Nocturnal leg cramps 09/27/2014  . Osteoporosis 03/10/2014  . Pneumonia   . Sickle cell trait ([place])   . Sleep apnea    borderline sleep  apnea, states she no longer uses, early 2012- stopped using   . Spinal stenosis of lumbar region with radiculopathy 02/28/2014  . Spondylolisthesis of lumbar region 03/19/2011  . Type II or unspecified type diabetes mellitus without mention of complication, uncontrolled 07/08/2013    Past Surgical History:  Procedure Laterality Date  . ABDOMINAL HYSTERECTOMY    . adb.cyst     ovarian cyst  . BACK SURGERY     2012, 2015 (3 total)  . BACK SURGERY  2018   02/24/2017  . COLONOSCOPY    . EYE SURGERY     macular degeneration treatment - injections  . FEMUR IM NAIL Left 06/13/2018   Procedure: INTRAMEDULLARY (IM) NAIL FEMORAL;  Surgeon: [name], MD;  [facility];  Service: Orthopedics;  Laterality: Left;  . OVARIAN CYST SURGERY    . POSTERIOR LUMBAR FUSION 4 LEVEL N/A 03/17/2017   Procedure: Decompression of Lumbar One-Two with Thoracic Ten to Lumbar Two Fusion;  Surgeon: Kristeen Miss, MD;  Location: [facility];  Service: Neurosurgery;  Laterality: N/A;  Decompression of L1-2 with T10 to L2 Fusion    There were no vitals filed for this visit.  Subjective Assessment - 02/16/19 1144    Subjective  "I am doing ok" pt amb in  clinic without AD    Pain Score  7     Pain Location  Back    Pain Orientation  Lower;Upper                       [facility] Adult PT Treatment/Exercise - 02/16/19 0001      Neck Exercises: Machines for Strengthening   UBE (Upper Arm Bike)  L1 x 3 min     Other Machines for Strengthening  Rows & Lats 15lb 2x15       Neck Exercises: Standing   Other Standing Exercises  back to wall lifting a small ball over head working on posture,      Knee/Hip Exercises: Aerobic   Nustep  level 4 x 6 minutes      Knee/Hip Exercises: Standing   Other Standing Knee Exercises  hip ext x10 each      Moist Heat Therapy   Number Minutes Moist Heat  15 Minutes    Moist Heat Location  Cervical      Electrical Stimulation   Electrical Stimulation  Location  neck right upper trap and into the rhomboid    Electrical Stimulation Action  IFC    Electrical Stimulation Parameters  sitting    Electrical Stimulation Goals  Pain               PT Short Term Goals - 01/20/19 1426      PT SHORT TERM GOAL #1   Title  independent with initial HEP    Time  2    Period  Weeks    Status  New        PT Long Term Goals - 02/10/19 1647      PT LONG TERM GOAL #1   Title  decrease pain 50%    Status  Partially Met      PT LONG TERM GOAL #2   Title  get up from sitting without pain and without hands    Status  Partially Met            Plan - 02/16/19 1225    Clinical Impression Statement  Pt enters clinic ambulating without AD. Added UBE warm up without issue. Tactile cues for posture needed with standing hip and shoulder exercises. Some forward lean with standing hip exercises despite cues. Pt does fatigue quick with standing exercises.    Comorbidities  DJD right knee, 5 lumbar surgeries, osteoporosis    PT Frequency  3x / week    PT Duration  8 weeks    PT Treatment/Interventions  ADLs/Self Care Home Management;Electrical Stimulation;Iontophoresis 104m/ml Dexamethasone;Moist Heat;Ultrasound;Functional mobility training;Stair training;Gait training;Therapeutic activities;Therapeutic exercise;Balance training;Neuromuscular re-education;Manual techniques;Patient/family education;Traction;Cryotherapy;Dry needling    PT Next Visit Plan  Postural strengthening and cervical ROM       Patient will benefit from skilled therapeutic intervention in order to improve the following deficits and impairments:  Pain, Postural dysfunction, Increased muscle spasms, Cardiopulmonary status limiting activity, Decreased activity tolerance, Decreased endurance, Decreased range of motion, Decreased strength, Decreased balance, Difficulty walking, Impaired flexibility, Improper body mechanics, Impaired UE functional use  Visit  Diagnosis: Cervicalgia  Difficulty in walking, not elsewhere classified  Cramp and spasm     Problem List Patient Active Problem List   Diagnosis Date Noted  . Fracture, proximal femur ([facility] 06/12/2018  . Femur fracture, left ([facility] 06/12/2018  . Acute blood loss as cause of postoperative anemia 03/30/2017  . Diabetic polyneuropathy associated with secondary diabetes mellitus ([facility] 03/30/2017  .  Asthma 03/30/2017  . Dyslipidemia 03/26/2017  . Lumbar stenosis with neurogenic claudication 03/17/2017  . Spinal stenosis of lumbar region 03/03/2017  . Myoclonic jerking   . Nausea   . Acute encephalopathy 07/15/2016  . Lumbar radiculopathy 06/27/2016  . Hypothyroidism 06/26/2016  . Labile blood glucose   . Surgery, elective   . Post-operative pain   . Sickle cell trait ([place])   . Acute blood loss anemia   . History of back surgery   . AKI (acute kidney injury) ([place])   . Herniated nucleus pulposus, L2-3 06/25/2016  . Bilateral primary osteoarthritis of knee 03/26/2016  . Osteoarthritis, hand 03/26/2016  . Other insomnia 03/26/2016  . Fibromyalgia 09/27/2014  . Nocturnal leg cramps 09/27/2014  . Body mass index (BMI) of 30.0-30.9 in adult 08/04/2014  . Neuropathy 03/10/2014  . Allergic rhinitis 03/10/2014  . Osteoporosis 03/10/2014  . Spinal stenosis of lumbar region with radiculopathy 02/28/2014  . Type 2 diabetes mellitus with complication, with long-term current use of insulin ([place]) 07/08/2013  . Hypokalemia 11/02/2012  . Essential hypertension, benign 11/02/2012  . Diabetes mellitus with neuropathy ([place]) 10/29/2012  . Hyperlipidemia 10/29/2012  . Spondylolisthesis of lumbar region 03/19/2011  . Lumbar radicular pain 03/19/2011    Scot Jun, PTA 02/16/2019, 12:32 PM  [place], [place], [address] Phone: [phone]   Fax:  [phone]  Name: [name] MRN:  [id number] Date of Birth: 09/19/1940

## 2019-02-16 NOTE — Telephone Encounter (Signed)
N/A-solved

## 2019-02-17 DIAGNOSIS — M5020 Other cervical disc displacement, unspecified cervical region: Secondary | ICD-10-CM | POA: Diagnosis not present

## 2019-02-18 ENCOUNTER — Other Ambulatory Visit: Payer: Self-pay

## 2019-02-18 ENCOUNTER — Ambulatory Visit: Payer: Medicare Other | Admitting: Physical Therapy

## 2019-02-18 DIAGNOSIS — R262 Difficulty in walking, not elsewhere classified: Secondary | ICD-10-CM | POA: Diagnosis not present

## 2019-02-18 DIAGNOSIS — M542 Cervicalgia: Secondary | ICD-10-CM

## 2019-02-18 DIAGNOSIS — R252 Cramp and spasm: Secondary | ICD-10-CM | POA: Diagnosis not present

## 2019-02-18 DIAGNOSIS — M5412 Radiculopathy, cervical region: Secondary | ICD-10-CM | POA: Diagnosis not present

## 2019-02-18 NOTE — Therapy (Signed)
[place], [place], [address] Phone: [phone]   Fax:  [phone]  Physical Therapy Treatment  Patient Details  Name: [name] MRN: [id number] Date of Birth: [birth year] Referring Provider (PT): Elsner   Encounter Date: 02/18/2019  PT End of Session - 02/18/19 1430    Visit Number  13    PT Start Time  9969    PT Stop Time  2493    PT Time Calculation (min)  49 min       Past Medical History:  Diagnosis Date  . Acute blood loss anemia   . Acute encephalopathy 07/15/2016  . AKI (acute kidney injury) ([place])   . Anemia    has sickle cell trait  . Anxiety   . Arthritis   . Asthma    has used inhaler in past for asthmatic bronchitis, last time- early 2012  . Bilateral primary osteoarthritis of knee 03/26/2016  . Complication of anesthesia    wakes up shaking  . Diabetes mellitus   . Diabetes mellitus with neuropathy ([place]) 10/29/2012  . Diverticulitis   . Dyslipidemia   . Encephalopathy 06/2016   due to medications after surgery  . Fibromyalgia   . GERD (gastroesophageal reflux disease)    occas. use of  Prilosec  . Heart murmur    sees Dr. Montez Morita, last seen- early 2012  . Herniated nucleus pulposus, L2-3 06/25/2016  . History of back surgery   . Hypertension    02/2010- stress test /w PCP  . Hypothyroidism   . Loose bowel movements 12/2016  . Lumbar radiculopathy 06/27/2016  . Lumbar stenosis with neurogenic claudication 03/17/2017  . Memory disorder 02/16/2016  . Myoclonic jerking   . Neuromuscular disorder (HCC)    lumbar radiculopathy, lumbago  . Nocturnal leg cramps 09/27/2014  . Osteoporosis 03/10/2014  . Pneumonia   . Sickle cell trait ([place])   . Sleep apnea    borderline sleep apnea, states she no longer uses, early 2012- stopped using   . Spinal stenosis of lumbar region with radiculopathy 02/28/2014  . Spondylolisthesis of lumbar region 03/19/2011  . Type II or  unspecified type diabetes mellitus without mention of complication, uncontrolled 07/08/2013    Past Surgical History:  Procedure Laterality Date  . ABDOMINAL HYSTERECTOMY    . adb.cyst     ovarian cyst  . BACK SURGERY     2012, 2015 (3 total)  . BACK SURGERY  2018   02/24/2017  . COLONOSCOPY    . EYE SURGERY     macular degeneration treatment - injections  . FEMUR IM NAIL Left 06/13/2018   Procedure: INTRAMEDULLARY (IM) NAIL FEMORAL;  Surgeon: [name], MD;  [facility];  Service: Orthopedics;  Laterality: Left;  . OVARIAN CYST SURGERY    . POSTERIOR LUMBAR FUSION 4 LEVEL N/A 03/17/2017   Procedure: Decompression of Lumbar One-Two with Thoracic Ten to Lumbar Two Fusion;  Surgeon: Kristeen Miss, MD;  Location: [facility];  Service: Neurosurgery;  Laterality: N/A;  Decompression of L1-2 with T10 to L2 Fusion    There were no vitals filed for this visit.  Subjective Assessment - 02/18/19 1355    Subjective  "doing pretty good" pt states that she wants to try traction at the request of her doctor    Currently in Pain?  Yes    Pain Score  8     Pain Location  Neck  [facility] Adult PT Treatment/Exercise - 02/18/19 0001      Neck Exercises: Machines for Strengthening   UBE (Upper Arm Bike)  L1 x 3 min     Other Machines for Strengthening  Rows & Lats 15lb 2x15       Neck Exercises: Standing   Other Standing Exercises  T and W stretches for postural stability       Knee/Hip Exercises: Aerobic   Nustep  level 4 x 7 minutes      Modalities   Modalities  Traction      Traction   Type of Traction  Cervical    Max (lbs)  15    Hold Time  12    Time  12               PT Short Term Goals - 02/18/19 1356      PT SHORT TERM GOAL #4   Title  The patient will report neck pain < or equal to 3/10 (from baseline of 5/10).    Status  On-going        PT Long Term Goals - 02/18/19 1357      PT LONG TERM GOAL #1   Title   decrease pain 50%    Status  Partially Met      PT LONG TERM GOAL #2   Title  get up from sitting without pain and without hands    Status  Partially Met            Plan - 02/18/19 1428    Clinical Impression Statement  Pt arrived w/o AD. pt tried traction and tolerated it well. exercises were scaled back due to pt complaint of 8/10 neck pain. pt requires rest breaks secondary to fatigue.    Personal Factors and Comorbidities  Comorbidity 3+    Comorbidities  DJD right knee, 5 lumbar surgeries, osteoporosis    Stability/Clinical Decision Making  Evolving/Moderate complexity    Rehab Potential  Good    PT Frequency  3x / week    PT Duration  8 weeks    PT Treatment/Interventions  ADLs/Self Care Home Management;Electrical Stimulation;Iontophoresis 71m/ml Dexamethasone;Moist Heat;Ultrasound;Functional mobility training;Stair training;Gait training;Therapeutic activities;Therapeutic exercise;Balance training;Neuromuscular re-education;Manual techniques;Patient/family education;Traction;Cryotherapy;Dry needling    PT Next Visit Plan  Postural strengthening and cervical ROM       Patient will benefit from skilled therapeutic intervention in order to improve the following deficits and impairments:  Pain, Postural dysfunction, Increased muscle spasms, Cardiopulmonary status limiting activity, Decreased activity tolerance, Decreased endurance, Decreased range of motion, Decreased strength, Decreased balance, Difficulty walking, Impaired flexibility, Improper body mechanics, Impaired UE functional use  Visit Diagnosis: Cervicalgia     Problem List Patient Active Problem List   Diagnosis Date Noted  . Fracture, proximal femur ([facility] [facility] 06/12/2018  . Femur fracture, left ([place] [place] 06/12/2018  . Acute blood loss as cause of postoperative anemia 03/30/2017  . Diabetic polyneuropathy associated with secondary diabetes mellitus ([facility] 03/30/2017  . Asthma 03/30/2017  . Dyslipidemia 03/26/2017  .  Lumbar stenosis with neurogenic claudication 03/17/2017  . Spinal stenosis of lumbar region 03/03/2017  . Myoclonic jerking   . Nausea   . Acute encephalopathy 07/15/2016  . Lumbar radiculopathy 06/27/2016  . Hypothyroidism 06/26/2016  . Labile blood glucose   . Surgery, elective   . Post-operative pain   . Sickle cell trait ([facility]   . Acute blood loss anemia   . History of back surgery   . AKI (acute kidney injury) ([facility]   .  Herniated nucleus pulposus, L2-3 06/25/2016  . Bilateral primary osteoarthritis of knee 03/26/2016  . Osteoarthritis, hand 03/26/2016  . Other insomnia 03/26/2016  . Fibromyalgia 09/27/2014  . Nocturnal leg cramps 09/27/2014  . Body mass index (BMI) of 30.0-30.9 in adult 08/04/2014  . Neuropathy 03/10/2014  . Allergic rhinitis 03/10/2014  . Osteoporosis 03/10/2014  . Spinal stenosis of lumbar region with radiculopathy 02/28/2014  . Type 2 diabetes mellitus with complication, with long-term current use of insulin ([place]) 07/08/2013  . Hypokalemia 11/02/2012  . Essential hypertension, benign 11/02/2012  . Diabetes mellitus with neuropathy ([place]) 10/29/2012  . Hyperlipidemia 10/29/2012  . Spondylolisthesis of lumbar region 03/19/2011  . Lumbar radicular pain 03/19/2011    Barrett Henle, [place] 02/18/2019, 2:55 PM  [place], [place], [address] Phone: [phone]   Fax:  [phone]  Name: RAVEENA HEBDON MRN: [id number] Date of Birth: 26-Mar-1941

## 2019-02-22 ENCOUNTER — Ambulatory Visit: Payer: Medicare Other | Attending: Orthopaedic Surgery | Admitting: Physical Therapy

## 2019-02-22 ENCOUNTER — Other Ambulatory Visit: Payer: Self-pay

## 2019-02-22 DIAGNOSIS — M545 Low back pain: Secondary | ICD-10-CM | POA: Diagnosis not present

## 2019-02-22 DIAGNOSIS — R252 Cramp and spasm: Secondary | ICD-10-CM

## 2019-02-22 DIAGNOSIS — M542 Cervicalgia: Secondary | ICD-10-CM | POA: Diagnosis not present

## 2019-02-22 NOTE — Therapy (Signed)
[place], [place], [address] Phone: [phone]   Fax:  [phone]  Physical Therapy Treatment  Patient Details  Name: [name] MRN: [id number] Date of Birth: [birth year] Referring Provider (PT): Elsner   Encounter Date: 02/22/2019  PT End of Session - 02/22/19 1523    Visit Number  14    PT Start Time  2182    PT Stop Time  1450    PT Time Calculation (min)  52 min       Past Medical History:  Diagnosis Date  . Acute blood loss anemia   . Acute encephalopathy 07/15/2016  . AKI (acute kidney injury) ([place])   . Anemia    has sickle cell trait  . Anxiety   . Arthritis   . Asthma    has used inhaler in past for asthmatic bronchitis, last time- early 2012  . Bilateral primary osteoarthritis of knee 03/26/2016  . Complication of anesthesia    wakes up shaking  . Diabetes mellitus   . Diabetes mellitus with neuropathy ([place]) 10/29/2012  . Diverticulitis   . Dyslipidemia   . Encephalopathy 06/2016   due to medications after surgery  . Fibromyalgia   . GERD (gastroesophageal reflux disease)    occas. use of  Prilosec  . Heart murmur    sees Dr. Montez Morita, last seen- early 2012  . Herniated nucleus pulposus, L2-3 06/25/2016  . History of back surgery   . Hypertension    02/2010- stress test /w PCP  . Hypothyroidism   . Loose bowel movements 12/2016  . Lumbar radiculopathy 06/27/2016  . Lumbar stenosis with neurogenic claudication 03/17/2017  . Memory disorder 02/16/2016  . Myoclonic jerking   . Neuromuscular disorder (HCC)    lumbar radiculopathy, lumbago  . Nocturnal leg cramps 09/27/2014  . Osteoporosis 03/10/2014  . Pneumonia   . Sickle cell trait ([place])   . Sleep apnea    borderline sleep apnea, states she no longer uses, early 2012- stopped using   . Spinal stenosis of lumbar region with radiculopathy 02/28/2014  . Spondylolisthesis of lumbar region 03/19/2011  . Type II or  unspecified type diabetes mellitus without mention of complication, uncontrolled 07/08/2013    Past Surgical History:  Procedure Laterality Date  . ABDOMINAL HYSTERECTOMY    . adb.cyst     ovarian cyst  . BACK SURGERY     2012, 2015 (3 total)  . BACK SURGERY  2018   02/24/2017  . COLONOSCOPY    . EYE SURGERY     macular degeneration treatment - injections  . FEMUR IM NAIL Left 06/13/2018   Procedure: INTRAMEDULLARY (IM) NAIL FEMORAL;  Surgeon: [name], MD;  [facility]: [facility];  Service: Orthopedics;  Laterality: Left;  . OVARIAN CYST SURGERY    . POSTERIOR LUMBAR FUSION 4 LEVEL N/A 03/17/2017   Procedure: Decompression of Lumbar One-Two with Thoracic Ten to Lumbar Two Fusion;  Surgeon: Kristeen Miss, MD;  Location: [facility];  Service: Neurosurgery;  Laterality: N/A;  Decompression of L1-2 with T10 to L2 Fusion    There were no vitals filed for this visit.  Subjective Assessment - 02/22/19 1409    Subjective  "i'm tred of hurting all the time". pt stated she felt fine after last session.    Currently in Pain?  Yes    Pain Score  10-Worst pain ever    Pain Location  Back    Pain Orientation  Lower;Upper                       [facility] Adult PT Treatment/Exercise - 02/22/19 0001      Neck Exercises: Machines for Strengthening   UBE (Upper Arm Bike)  L1 x 3 min forward/backward    Nustep  L4 x 5 min      Neck Exercises: Theraband   Shoulder Extension  20 reps   yellow tband   Rows  20 reps   w/ yellow tband     Moist Heat Therapy   Number Minutes Moist Heat  15 Minutes    Moist Heat Location  Cervical;Lumbar Spine      Electrical Stimulation   Electrical Stimulation Location  neck right upper trap and into the lower back    Electrical Stimulation Action  IFC    Electrical Stimulation Parameters  sitting    Electrical Stimulation Goals  Pain               PT Short Term Goals - 02/18/19 1356      PT SHORT TERM GOAL #4   Title  The  patient will report neck pain < or equal to 3/10 (from baseline of 5/10).    Status  On-going        PT Long Term Goals - 02/18/19 1357      PT LONG TERM GOAL #1   Title  decrease pain 50%    Status  Partially Met      PT LONG TERM GOAL #2   Title  get up from sitting without pain and without hands    Status  Partially Met            Plan - 02/22/19 1525    Clinical Impression Statement  pt arrived 13 minutes late and in tears due to increased pain. It took her awhile to make it down the hall. She came in with her cane for the first time in awhile. Exercises were scaled back due to pt complaint of 10/10 pain. After warming up, pt started to interact with SPTA and PTA.    Personal Factors and Comorbidities  Comorbidity 3+    Comorbidities  DJD right knee, 5 lumbar surgeries, osteoporosis    Stability/Clinical Decision Making  Evolving/Moderate complexity    Rehab Potential  Good    PT Frequency  3x / week    PT Duration  8 weeks    PT Treatment/Interventions  ADLs/Self Care Home Management;Electrical Stimulation;Iontophoresis 39m/ml Dexamethasone;Moist Heat;Ultrasound;Functional mobility training;Stair training;Gait training;Therapeutic activities;Therapeutic exercise;Balance training;Neuromuscular re-education;Manual techniques;Patient/family education;Traction;Cryotherapy;Dry needling    PT Next Visit Plan  Postural strengthening and cervical ROM       Patient will benefit from skilled therapeutic intervention in order to improve the following deficits and impairments:  Pain, Postural dysfunction, Increased muscle spasms, Cardiopulmonary status limiting activity, Decreased activity tolerance, Decreased endurance, Decreased range of motion, Decreased strength, Decreased balance, Difficulty walking, Impaired flexibility, Improper body mechanics, Impaired UE functional use  Visit Diagnosis: Cervicalgia  Cramp and spasm     Problem List Patient Active Problem List    Diagnosis Date Noted  . Fracture, proximal femur ([place] 06/12/2018  . Femur fracture, left ([place] 06/12/2018  . Acute blood loss as cause of postoperative anemia 03/30/2017  . Diabetic polyneuropathy associated with secondary diabetes mellitus ([place] 03/30/2017  . Asthma 03/30/2017  . Dyslipidemia 03/26/2017  . Lumbar stenosis with neurogenic claudication 03/17/2017  . Spinal stenosis of lumbar region 03/03/2017  .  Myoclonic jerking   . Nausea   . Acute encephalopathy 07/15/2016  . Lumbar radiculopathy 06/27/2016  . Hypothyroidism 06/26/2016  . Labile blood glucose   . Surgery, elective   . Post-operative pain   . Sickle cell trait ([place])   . Acute blood loss anemia   . History of back surgery   . AKI (acute kidney injury) ([place])   . Herniated nucleus pulposus, L2-3 06/25/2016  . Bilateral primary osteoarthritis of knee 03/26/2016  . Osteoarthritis, hand 03/26/2016  . Other insomnia 03/26/2016  . Fibromyalgia 09/27/2014  . Nocturnal leg cramps 09/27/2014  . Body mass index (BMI) of 30.0-30.9 in adult 08/04/2014  . Neuropathy 03/10/2014  . Allergic rhinitis 03/10/2014  . Osteoporosis 03/10/2014  . Spinal stenosis of lumbar region with radiculopathy 02/28/2014  . Type 2 diabetes mellitus with complication, with long-term current use of insulin ([place]) 07/08/2013  . Hypokalemia 11/02/2012  . Essential hypertension, benign 11/02/2012  . Diabetes mellitus with neuropathy ([place]) 10/29/2012  . Hyperlipidemia 10/29/2012  . Spondylolisthesis of lumbar region 03/19/2011  . Lumbar radicular pain 03/19/2011    Barrett Henle, [place] 02/22/2019, 3:39 PM  [place], [place], [address] Phone: [phone]   Fax:  [phone]  Name: [name] MRN: [id number] Date of Birth: Jan 31, 1941

## 2019-02-23 ENCOUNTER — Ambulatory Visit: Payer: Medicare Other | Admitting: Physical Therapy

## 2019-02-23 DIAGNOSIS — D573 Sickle-cell trait: Secondary | ICD-10-CM | POA: Diagnosis not present

## 2019-02-23 DIAGNOSIS — M1990 Unspecified osteoarthritis, unspecified site: Secondary | ICD-10-CM | POA: Diagnosis not present

## 2019-02-23 DIAGNOSIS — M810 Age-related osteoporosis without current pathological fracture: Secondary | ICD-10-CM | POA: Diagnosis not present

## 2019-02-23 DIAGNOSIS — E1142 Type 2 diabetes mellitus with diabetic polyneuropathy: Secondary | ICD-10-CM | POA: Diagnosis not present

## 2019-02-23 DIAGNOSIS — E7800 Pure hypercholesterolemia, unspecified: Secondary | ICD-10-CM | POA: Diagnosis not present

## 2019-02-23 DIAGNOSIS — E039 Hypothyroidism, unspecified: Secondary | ICD-10-CM | POA: Diagnosis not present

## 2019-02-23 DIAGNOSIS — Z0000 Encounter for general adult medical examination without abnormal findings: Secondary | ICD-10-CM | POA: Diagnosis not present

## 2019-02-23 DIAGNOSIS — Z1389 Encounter for screening for other disorder: Secondary | ICD-10-CM | POA: Diagnosis not present

## 2019-02-23 DIAGNOSIS — I10 Essential (primary) hypertension: Secondary | ICD-10-CM | POA: Diagnosis not present

## 2019-02-25 ENCOUNTER — Ambulatory Visit: Payer: Medicare Other | Admitting: Physical Therapy

## 2019-03-01 ENCOUNTER — Other Ambulatory Visit: Payer: Self-pay

## 2019-03-01 ENCOUNTER — Ambulatory Visit: Payer: Medicare Other | Admitting: Physical Therapy

## 2019-03-01 DIAGNOSIS — M542 Cervicalgia: Secondary | ICD-10-CM

## 2019-03-01 DIAGNOSIS — M545 Low back pain: Secondary | ICD-10-CM | POA: Diagnosis not present

## 2019-03-01 DIAGNOSIS — R252 Cramp and spasm: Secondary | ICD-10-CM | POA: Diagnosis not present

## 2019-03-01 NOTE — Therapy (Signed)
[place], [place], [address] Phone: [phone]   Fax:  [phone]  Physical Therapy Treatment  Patient Details  Name: [name] MRN: [id number] Date of Birth: 1940-07-20 Referring Provider (PT): Elsner   Encounter Date: 03/01/2019  PT End of Session - 03/01/19 1428    Visit Number  15    PT Start Time  6962    PT Stop Time  1440    PT Time Calculation (min)  45 min       Past Medical History:  Diagnosis Date  . Acute blood loss anemia   . Acute encephalopathy 07/15/2016  . AKI (acute kidney injury) ([place])   . Anemia    has sickle cell trait  . Anxiety   . Arthritis   . Asthma    has used inhaler in past for asthmatic bronchitis, last time- early 2012  . Bilateral primary osteoarthritis of knee 03/26/2016  . Complication of anesthesia    wakes up shaking  . Diabetes mellitus   . Diabetes mellitus with neuropathy ([place]) 10/29/2012  . Diverticulitis   . Dyslipidemia   . Encephalopathy 06/2016   due to medications after surgery  . Fibromyalgia   . GERD (gastroesophageal reflux disease)    occas. use of  Prilosec  . Heart murmur    sees Dr. Montez Morita, last seen- early 2012  . Herniated nucleus pulposus, L2-3 06/25/2016  . History of back surgery   . Hypertension    02/2010- stress test /w PCP  . Hypothyroidism   . Loose bowel movements 12/2016  . Lumbar radiculopathy 06/27/2016  . Lumbar stenosis with neurogenic claudication 03/17/2017  . Memory disorder 02/16/2016  . Myoclonic jerking   . Neuromuscular disorder (HCC)    lumbar radiculopathy, lumbago  . Nocturnal leg cramps 09/27/2014  . Osteoporosis 03/10/2014  . Pneumonia   . Sickle cell trait ([place])   . Sleep apnea    borderline sleep apnea, states she no longer uses, early 2012- stopped using   . Spinal stenosis of lumbar region with radiculopathy 02/28/2014  . Spondylolisthesis of lumbar region 03/19/2011  . Type II or  unspecified type diabetes mellitus without mention of complication, uncontrolled 07/08/2013    Past Surgical History:  Procedure Laterality Date  . ABDOMINAL HYSTERECTOMY    . adb.cyst     ovarian cyst  . BACK SURGERY     2012, 2015 (3 total)  . BACK SURGERY  2018   02/24/2017  . COLONOSCOPY    . EYE SURGERY     macular degeneration treatment - injections  . FEMUR IM NAIL Left 06/13/2018   Procedure: INTRAMEDULLARY (IM) NAIL FEMORAL;  Surgeon: [name], MD;  [facility];  Service: Orthopedics;  Laterality: Left;  . OVARIAN CYST SURGERY    . POSTERIOR LUMBAR FUSION 4 LEVEL N/A 03/17/2017   Procedure: Decompression of Lumbar One-Two with Thoracic Ten to Lumbar Two Fusion;  Surgeon: Kristeen Miss, MD;  Location: [facility];  Service: Neurosurgery;  Laterality: N/A;  Decompression of L1-2 with T10 to L2 Fusion    There were no vitals filed for this visit.  Subjective Assessment - 03/01/19 1356    Subjective  "just hurting"    Currently in Pain?  Yes    Pain Score  9     Pain Location  Neck  [facility] Adult PT Treatment/Exercise - 03/01/19 0001      Neck Exercises: Machines for Strengthening   UBE (Upper Arm Bike)  L1 x 2 min forward/backward    Nustep  L4 x 5 min    Cybex Row  15# 2 x 15    Lat Pull  15# 2 x 15      Neck Exercises: Seated   Other Seated Exercise  ab sets and chest press 2 x 10      Traction   Type of Traction  Cervical    Max (lbs)  15    Hold Time  12    Time  12               PT Short Term Goals - 02/18/19 1356      PT SHORT TERM GOAL #4   Title  The patient will report neck pain < or equal to 3/10 (from baseline of 5/10).    Status  On-going        PT Long Term Goals - 02/18/19 1357      PT LONG TERM GOAL #1   Title  decrease pain 50%    Status  Partially Met      PT LONG TERM GOAL #2   Title  get up from sitting without pain and without hands    Status  Partially Met             Plan - 03/01/19 1432    Clinical Impression Statement  Pt arrived 10 minutes late. she brought her cane in with her. Pt was willing to try traction again. she tolerated it well. exercises were kept pretty simple due to pt complaints of 9/10 neck pain. pt had no increase of pain with interventions.    Personal Factors and Comorbidities  Comorbidity 3+    Comorbidities  DJD right knee, 5 lumbar surgeries, osteoporosis    Stability/Clinical Decision Making  Evolving/Moderate complexity    Rehab Potential  Good    PT Frequency  3x / week    PT Duration  8 weeks    PT Treatment/Interventions  ADLs/Self Care Home Management;Electrical Stimulation;Iontophoresis 4mg/ml Dexamethasone;Moist Heat;Ultrasound;Functional mobility training;Stair training;Gait training;Therapeutic activities;Therapeutic exercise;Balance training;Neuromuscular re-education;Manual techniques;Patient/family education;Traction;Cryotherapy;Dry needling    PT Next Visit Plan  Postural strengthening and cervical ROM       Patient will benefit from skilled therapeutic intervention in order to improve the following deficits and impairments:  Pain, Postural dysfunction, Increased muscle spasms, Cardiopulmonary status limiting activity, Decreased activity tolerance, Decreased endurance, Decreased range of motion, Decreased strength, Decreased balance, Difficulty walking, Impaired flexibility, Improper body mechanics, Impaired UE functional use  Visit Diagnosis: Cervicalgia     Problem List Patient Active Problem List   Diagnosis Date Noted  . Fracture, proximal femur (HCC) 06/12/2018  . Femur fracture, left (HCC) 06/12/2018  . Acute blood loss as cause of postoperative anemia 03/30/2017  . Diabetic polyneuropathy associated with secondary diabetes mellitus (HCC) 03/30/2017  . Asthma 03/30/2017  . Dyslipidemia 03/26/2017  . Lumbar stenosis with neurogenic claudication 03/17/2017  . Spinal stenosis of lumbar region  03/03/2017  . Myoclonic jerking   . Nausea   . Acute encephalopathy 07/15/2016  . Lumbar radiculopathy 06/27/2016  . Hypothyroidism 06/26/2016  . Labile blood glucose   . Surgery, elective   . Post-operative pain   . Sickle cell trait (HCC)   . Acute blood loss anemia   . History of back surgery   . AKI (acute kidney injury) (  [place])   . Herniated nucleus pulposus, L2-3 06/25/2016  . Bilateral primary osteoarthritis of knee 03/26/2016  . Osteoarthritis, hand 03/26/2016  . Other insomnia 03/26/2016  . Fibromyalgia 09/27/2014  . Nocturnal leg cramps 09/27/2014  . Body mass index (BMI) of 30.0-30.9 in adult 08/04/2014  . Neuropathy 03/10/2014  . Allergic rhinitis 03/10/2014  . Osteoporosis 03/10/2014  . Spinal stenosis of lumbar region with radiculopathy 02/28/2014  . Type 2 diabetes mellitus with complication, with long-term current use of insulin ([place]) 07/08/2013  . Hypokalemia 11/02/2012  . Essential hypertension, benign 11/02/2012  . Diabetes mellitus with neuropathy ([place]) 10/29/2012  . Hyperlipidemia 10/29/2012  . Spondylolisthesis of lumbar region 03/19/2011  . Lumbar radicular pain 03/19/2011    Barrett Henle, [place] 03/01/2019, 2:38 PM  [place] [address] [place], [place], [address] Phone: [phone]   Fax:  [phone]  Name: [name] MRN: [id number] Date of Birth: Aug 29, 1940

## 2019-03-03 ENCOUNTER — Ambulatory Visit: Payer: Medicare Other | Admitting: Physical Therapy

## 2019-03-05 ENCOUNTER — Ambulatory Visit: Payer: Medicare Other | Admitting: Physical Therapy

## 2019-03-08 ENCOUNTER — Other Ambulatory Visit: Payer: Self-pay

## 2019-03-08 ENCOUNTER — Ambulatory Visit: Payer: Medicare Other | Admitting: Physical Therapy

## 2019-03-08 DIAGNOSIS — M542 Cervicalgia: Secondary | ICD-10-CM | POA: Diagnosis not present

## 2019-03-08 DIAGNOSIS — R252 Cramp and spasm: Secondary | ICD-10-CM | POA: Diagnosis not present

## 2019-03-08 DIAGNOSIS — M545 Low back pain: Secondary | ICD-10-CM | POA: Diagnosis not present

## 2019-03-08 NOTE — Therapy (Signed)
[facility] [address] [place], [place], [address] Phone: [phone]   Fax:  [phone]  Physical Therapy Treatment  Patient Details  Name: [name] MRN: [id number] Date of Birth: 09/23/1940 Referring Provider (PT): Elsner   Encounter Date: 03/08/2019  PT End of Session - 03/08/19 1432    Visit Number  16    PT Start Time  1355    PT Stop Time  1445    PT Time Calculation (min)  50 min       Past Medical History:  Diagnosis Date  . Acute blood loss anemia   . Acute encephalopathy 07/15/2016  . AKI (acute kidney injury) ([facility])   . Anemia    has sickle cell trait  . Anxiety   . Arthritis   . Asthma    has used inhaler in past for asthmatic bronchitis, last time- early 2012  . Bilateral primary osteoarthritis of knee 03/26/2016  . Complication of anesthesia    wakes up shaking  . Diabetes mellitus   . Diabetes mellitus with neuropathy ([facility]) 10/29/2012  . Diverticulitis   . Dyslipidemia   . Encephalopathy 06/2016   due to medications after surgery  . Fibromyalgia   . GERD (gastroesophageal reflux disease)    occas. use of  Prilosec  . Heart murmur    sees Dr. Spruill, last seen- early 2012  . Herniated nucleus pulposus, L2-3 06/25/2016  . History of back surgery   . Hypertension    02/2010- stress test /w PCP  . Hypothyroidism   . Loose bowel movements 12/2016  . Lumbar radiculopathy 06/27/2016  . Lumbar stenosis with neurogenic claudication 03/17/2017  . Memory disorder 02/16/2016  . Myoclonic jerking   . Neuromuscular disorder (HCC)    lumbar radiculopathy, lumbago  . Nocturnal leg cramps 09/27/2014  . Osteoporosis 03/10/2014  . Pneumonia   . Sickle cell trait (H[facility])   . Sleep apnea    borderline sleep apnea, states she no longer uses, early 2012- stopped using   . Spinal stenosis of lumbar region with radiculopathy 02/28/2014  . Spondylolisthesis of lumbar region 03/19/2011  . Type II or  unspecified type diabetes mellitus without mention of complication, uncontrolled 07/08/2013    Past Surgical History:  Procedure Laterality Date  . ABDOMINAL HYSTERECTOMY    . adb.cyst     ovarian cyst  . BACK SURGERY     2012, 2015 (3 total)  . BACK SURGERY  2018   02/24/2017  . COLONOSCOPY    . EYE SURGERY     macular degeneration treatment - injections  . FEMUR IM NAIL Left 06/13/2018   Procedure: INTRAMEDULLARY (IM) NAIL FEMORAL;  Surgeon: Blackman, Christopher Y, MD;  [facility];  Service: Orthopedics;  Laterality: Left;  . OVARIAN CYST SURGERY    . POSTERIOR LUMBAR FUSION 4 LEVEL N/A 03/17/2017   Procedure: Decompression of Lumbar One-Two with Thoracic Ten to Lumbar Two Fusion;  Surgeon: Elsner, Henry, MD;  [facility];  Service: Neurosurgery;  Laterality: N/A;  Decompression of L1-2 with T10 to L2 Fusion    There were no vitals filed for this visit.  Subjective Assessment - 03/08/19 1355    Subjective  "doing much better today"    Currently in Pain?  No/denies                       [facility] Adult PT Treatment/Exercise - 03/08/19 0001        Neck Exercises: Machines for Strengthening   Cybex Row  15# 2 x 15    Lat Pull  15# 2 x 15      Neck Exercises: Standing   Other Standing Exercises  shrugs and rolls 2 x 10 3#      Neck Exercises: Seated   Other Seated Exercise  lumbar extension/flexion 2 x 10      Knee/Hip Exercises: Aerobic   Nustep  level 5 x 7 minutes      Electrical Stimulation   Electrical Stimulation Location  lower back    Electrical Stimulation Action  IFC    Electrical Stimulation Parameters  sitting    Electrical Stimulation Goals  Pain               PT Short Term Goals - 02/18/19 1356      PT SHORT TERM GOAL #4   Title  The patient will report neck pain < or equal to 3/10 (from baseline of 5/10).    Status  On-going        PT Long Term Goals - 02/18/19 1357      PT LONG TERM GOAL #1   Title  decrease pain  50%    Status  Partially Met      PT LONG TERM GOAL #2   Title  get up from sitting without pain and without hands    Status  Partially Met            Plan - 03/08/19 1438    Clinical Impression Statement  pt arrived 10 minutes late. pt tried laying down for traction, but was unable due to asthma acting up. she then agreed to do stim and moist heat. pt continues to work on postural stability.    Personal Factors and Comorbidities  Comorbidity 3+    Comorbidities  DJD right knee, 5 lumbar surgeries, osteoporosis    Stability/Clinical Decision Making  Evolving/Moderate complexity    Rehab Potential  Good    PT Frequency  3x / week    PT Duration  8 weeks    PT Treatment/Interventions  ADLs/Self Care Home Management;Electrical Stimulation;Iontophoresis 4mg/ml Dexamethasone;Moist Heat;Ultrasound;Functional mobility training;Stair training;Gait training;Therapeutic activities;Therapeutic exercise;Balance training;Neuromuscular re-education;Manual techniques;Patient/family education;Traction;Cryotherapy;Dry needling    PT Next Visit Plan  Postural strengthening and cervical ROM       Patient will benefit from skilled therapeutic intervention in order to improve the following deficits and impairments:  Pain, Postural dysfunction, Increased muscle spasms, Cardiopulmonary status limiting activity, Decreased activity tolerance, Decreased endurance, Decreased range of motion, Decreased strength, Decreased balance, Difficulty walking, Impaired flexibility, Improper body mechanics, Impaired UE functional use  Visit Diagnosis: Cervicalgia     Problem List Patient Active Problem List   Diagnosis Date Noted  . Fracture, proximal femur (HCC) 06/12/2018  . Femur fracture, left (HCC) 06/12/2018  . Acute blood loss as cause of postoperative anemia 03/30/2017  . Diabetic polyneuropathy associated with secondary diabetes mellitus (H[facility]) 03/30/2017  . Asthma 03/30/2017  . Dyslipidemia 03/26/2017   . Lumbar stenosis with neurogenic claudication 03/17/2017  . Spinal stenosis of lumbar region 03/03/2017  . Myoclonic jerking   . Nausea   . Acute encephalopathy 07/15/2016  . Lumbar radiculopathy 06/27/2016  . Hypothyroidism 06/26/2016  . Labile blood glucose   . Surgery, elective   . Post-operative pain   . Sickle cell trait (HCC)   . Acute blood loss anemia   . History of back surgery   . AKI (acute kidney injury) ([facility])   .   Herniated nucleus pulposus, L2-3 06/25/2016  . Bilateral primary osteoarthritis of knee 03/26/2016  . Osteoarthritis, hand 03/26/2016  . Other insomnia 03/26/2016  . Fibromyalgia 09/27/2014  . Nocturnal leg cramps 09/27/2014  . Body mass index (BMI) of 30.0-30.9 in adult 08/04/2014  . Neuropathy 03/10/2014  . Allergic rhinitis 03/10/2014  . Osteoporosis 03/10/2014  . Spinal stenosis of lumbar region with radiculopathy 02/28/2014  . Type 2 diabetes mellitus with complication, with long-term current use of insulin ([place]) 07/08/2013  . Hypokalemia 11/02/2012  . Essential hypertension, benign 11/02/2012  . Diabetes mellitus with neuropathy ([place]) 10/29/2012  . Hyperlipidemia 10/29/2012  . Spondylolisthesis of lumbar region 03/19/2011  . Lumbar radicular pain 03/19/2011    Barrett Henle, [place] 03/08/2019, 4:07 PM  [place], [place], [address] Phone: [phone]   Fax:  [phone]  Name: [name] MRN: [id number] Date of Birth: 02-04-1941

## 2019-03-10 ENCOUNTER — Ambulatory Visit: Payer: Medicare Other | Admitting: Physical Therapy

## 2019-03-10 ENCOUNTER — Other Ambulatory Visit: Payer: Self-pay

## 2019-03-10 DIAGNOSIS — M542 Cervicalgia: Secondary | ICD-10-CM | POA: Diagnosis not present

## 2019-03-10 DIAGNOSIS — R252 Cramp and spasm: Secondary | ICD-10-CM | POA: Diagnosis not present

## 2019-03-10 DIAGNOSIS — M545 Low back pain: Secondary | ICD-10-CM | POA: Diagnosis not present

## 2019-03-10 NOTE — Therapy (Signed)
[facility] [place], [place], [address] Phone: [phone]   Fax:  [phone]  Physical Therapy Treatment  Patient Details  Name: [name] MRN: [id number] Date of Birth: Jan 13, 1941 Referring Provider (PT): Elsner   Encounter Date: 03/10/2019  PT End of Session - 03/10/19 1431    Visit Number  17    PT Start Time  6294    PT Stop Time  7654    PT Time Calculation (min)  57 min       Past Medical History:  Diagnosis Date  . Acute blood loss anemia   . Acute encephalopathy 07/15/2016  . AKI (acute kidney injury) ([place])   . Anemia    has sickle cell trait  . Anxiety   . Arthritis   . Asthma    has used inhaler in past for asthmatic bronchitis, last time- early 2012  . Bilateral primary osteoarthritis of knee 03/26/2016  . Complication of anesthesia    wakes up shaking  . Diabetes mellitus   . Diabetes mellitus with neuropathy ([place]) 10/29/2012  . Diverticulitis   . Dyslipidemia   . Encephalopathy 06/2016   due to medications after surgery  . Fibromyalgia   . GERD (gastroesophageal reflux disease)    occas. use of  Prilosec  . Heart murmur    sees Dr. Montez Morita, last seen- early 2012  . Herniated nucleus pulposus, L2-3 06/25/2016  . History of back surgery   . Hypertension    02/2010- stress test /w PCP  . Hypothyroidism   . Loose bowel movements 12/2016  . Lumbar radiculopathy 06/27/2016  . Lumbar stenosis with neurogenic claudication 03/17/2017  . Memory disorder 02/16/2016  . Myoclonic jerking   . Neuromuscular disorder (HCC)    lumbar radiculopathy, lumbago  . Nocturnal leg cramps 09/27/2014  . Osteoporosis 03/10/2014  . Pneumonia   . Sickle cell trait ([place])   . Sleep apnea    borderline sleep apnea, states she no longer uses, early 2012- stopped using   . Spinal stenosis of lumbar region with radiculopathy 02/28/2014  . Spondylolisthesis of lumbar region 03/19/2011  . Type II or  unspecified type diabetes mellitus without mention of complication, uncontrolled 07/08/2013    Past Surgical History:  Procedure Laterality Date  . ABDOMINAL HYSTERECTOMY    . adb.cyst     ovarian cyst  . BACK SURGERY     2012, 2015 (3 total)  . BACK SURGERY  2018   02/24/2017  . COLONOSCOPY    . EYE SURGERY     macular degeneration treatment - injections  . FEMUR IM NAIL Left 06/13/2018   Procedure: INTRAMEDULLARY (IM) NAIL FEMORAL;  Surgeon: [name], MD;  Location: [facility];  Service: Orthopedics;  Laterality: Left;  . OVARIAN CYST SURGERY    . POSTERIOR LUMBAR FUSION 4 LEVEL N/A 03/17/2017   Procedure: Decompression of Lumbar One-Two with Thoracic Ten to Lumbar Two Fusion;  Surgeon: Kristeen Miss, MD;  Location: [facility];  Service: Neurosurgery;  Laterality: N/A;  Decompression of L1-2 with T10 to L2 Fusion    There were no vitals filed for this visit.  Subjective Assessment - 03/10/19 1353    Subjective  "feeling stiff today"    Currently in Pain?  Yes    Pain Location  Neck                       [facility] Adult PT Treatment/Exercise -  03/10/19 0001      Neck Exercises: Machines for Strengthening   UBE (Upper Arm Bike)  L1 x 3 min forward/backward    Cybex Row  15# 2 x 15    Lat Pull  15# 2 x 15      Neck Exercises: Seated   Other Seated Exercise  lumbar extension 2 x 10      Knee/Hip Exercises: Aerobic   Nustep  level 5 x 7 minutes      Moist Heat Therapy   Number Minutes Moist Heat  15 Minutes    Moist Heat Location  Cervical;Lumbar Spine      Electrical Stimulation   Electrical Stimulation Location  lower back    Electrical Stimulation Action  IFC    Electrical Stimulation Parameters  sitting    Electrical Stimulation Goals  Pain               PT Short Term Goals - 03/10/19 1403      PT SHORT TERM GOAL #1   Title  independent with initial HEP      PT SHORT TERM GOAL #4   Title  The patient will report neck pain < or equal  to 3/10 (from baseline of 5/10).    Status  On-going        PT Long Term Goals - 03/10/19 1405      PT LONG TERM GOAL #1   Title  decrease pain 50%    Status  Achieved      PT LONG TERM GOAL #2   Title  get up from sitting without pain and without hands    Status  Partially Met      PT LONG TERM GOAL #4   Title  report 50% less HA    Status  Achieved            Plan - 03/10/19 1434    Clinical Impression Statement  pt tolerated treatment well as demonstrated by no increase of pain with interventions. pt continues to work on postural stability. pt needs cues for redirection. pt back to using heat and stim as traction aggravates her asthma.    Comorbidities  DJD right knee, 5 lumbar surgeries, osteoporosis    Stability/Clinical Decision Making  Evolving/Moderate complexity    Rehab Potential  Good    PT Frequency  3x / week    PT Duration  8 weeks    PT Treatment/Interventions  ADLs/Self Care Home Management;Electrical Stimulation;Iontophoresis 89m/ml Dexamethasone;Moist Heat;Ultrasound;Functional mobility training;Stair training;Gait training;Therapeutic activities;Therapeutic exercise;Balance training;Neuromuscular re-education;Manual techniques;Patient/family education;Traction;Cryotherapy;Dry needling    PT Next Visit Plan  Postural strengthening and cervical ROM       Patient will benefit from skilled therapeutic intervention in order to improve the following deficits and impairments:  Pain, Postural dysfunction, Increased muscle spasms, Cardiopulmonary status limiting activity, Decreased activity tolerance, Decreased endurance, Decreased range of motion, Decreased strength, Decreased balance, Difficulty walking, Impaired flexibility, Improper body mechanics, Impaired UE functional use  Visit Diagnosis: Cervicalgia     Problem List Patient Active Problem List   Diagnosis Date Noted  . Fracture, proximal femur ([place] 06/12/2018  . Femur fracture, left ([facility]  06/12/2018  . Acute blood loss as cause of postoperative anemia 03/30/2017  . Diabetic polyneuropathy associated with secondary diabetes mellitus ([place] 03/30/2017  . Asthma 03/30/2017  . Dyslipidemia 03/26/2017  . Lumbar stenosis with neurogenic claudication 03/17/2017  . Spinal stenosis of lumbar region 03/03/2017  . Myoclonic jerking   . Nausea   .  Acute encephalopathy 07/15/2016  . Lumbar radiculopathy 06/27/2016  . Hypothyroidism 06/26/2016  . Labile blood glucose   . Surgery, elective   . Post-operative pain   . Sickle cell trait ([place])   . Acute blood loss anemia   . History of back surgery   . AKI (acute kidney injury) ([place])   . Herniated nucleus pulposus, L2-3 06/25/2016  . Bilateral primary osteoarthritis of knee 03/26/2016  . Osteoarthritis, hand 03/26/2016  . Other insomnia 03/26/2016  . Fibromyalgia 09/27/2014  . Nocturnal leg cramps 09/27/2014  . Body mass index (BMI) of 30.0-30.9 in adult 08/04/2014  . Neuropathy 03/10/2014  . Allergic rhinitis 03/10/2014  . Osteoporosis 03/10/2014  . Spinal stenosis of lumbar region with radiculopathy 02/28/2014  . Type 2 diabetes mellitus with complication, with long-term current use of insulin ([place]) 07/08/2013  . Hypokalemia 11/02/2012  . Essential hypertension, benign 11/02/2012  . Diabetes mellitus with neuropathy ([place]) 10/29/2012  . Hyperlipidemia 10/29/2012  . Spondylolisthesis of lumbar region 03/19/2011  . Lumbar radicular pain 03/19/2011    Barrett Henle, [place] 03/10/2019, 2:40 PM  [place], [place], [address] Phone: [phone]   Fax:  [phone]  Name: [name] MRN: [id number] Date of Birth: [birth year]

## 2019-03-12 ENCOUNTER — Ambulatory Visit: Payer: Medicare Other | Admitting: Physical Therapy

## 2019-03-15 ENCOUNTER — Ambulatory Visit: Payer: Medicare Other | Admitting: Physical Therapy

## 2019-03-15 ENCOUNTER — Other Ambulatory Visit: Payer: Self-pay

## 2019-03-15 DIAGNOSIS — M545 Low back pain, unspecified: Secondary | ICD-10-CM

## 2019-03-15 DIAGNOSIS — M542 Cervicalgia: Secondary | ICD-10-CM | POA: Diagnosis not present

## 2019-03-15 DIAGNOSIS — R252 Cramp and spasm: Secondary | ICD-10-CM | POA: Diagnosis not present

## 2019-03-15 NOTE — Therapy (Signed)
[facility] [place], [place], [address] Phone: [phone]   Fax:  [phone]  Physical Therapy Treatment  Patient Details  Name: [name] MRN: [id number] Date of Birth: 09-29-1940 Referring Provider (PT): Elsner   Encounter Date: 03/15/2019  PT End of Session - 03/15/19 1425    Visit Number  18    PT Start Time  1540    PT Stop Time  1435    PT Time Calculation (min)  40 min       Past Medical History:  Diagnosis Date  . Acute blood loss anemia   . Acute encephalopathy 07/15/2016  . AKI (acute kidney injury) ([place])   . Anemia    has sickle cell trait  . Anxiety   . Arthritis   . Asthma    has used inhaler in past for asthmatic bronchitis, last time- early 2012  . Bilateral primary osteoarthritis of knee 03/26/2016  . Complication of anesthesia    wakes up shaking  . Diabetes mellitus   . Diabetes mellitus with neuropathy ([place]) 10/29/2012  . Diverticulitis   . Dyslipidemia   . Encephalopathy 06/2016   due to medications after surgery  . Fibromyalgia   . GERD (gastroesophageal reflux disease)    occas. use of  Prilosec  . Heart murmur    sees Dr. Montez Morita, last seen- early 2012  . Herniated nucleus pulposus, L2-3 06/25/2016  . History of back surgery   . Hypertension    02/2010- stress test /w PCP  . Hypothyroidism   . Loose bowel movements 12/2016  . Lumbar radiculopathy 06/27/2016  . Lumbar stenosis with neurogenic claudication 03/17/2017  . Memory disorder 02/16/2016  . Myoclonic jerking   . Neuromuscular disorder (HCC)    lumbar radiculopathy, lumbago  . Nocturnal leg cramps 09/27/2014  . Osteoporosis 03/10/2014  . Pneumonia   . Sickle cell trait ([place])   . Sleep apnea    borderline sleep apnea, states she no longer uses, early 2012- stopped using   . Spinal stenosis of lumbar region with radiculopathy 02/28/2014  . Spondylolisthesis of lumbar region 03/19/2011  . Type II or  unspecified type diabetes mellitus without mention of complication, uncontrolled 07/08/2013    Past Surgical History:  Procedure Laterality Date  . ABDOMINAL HYSTERECTOMY    . adb.cyst     ovarian cyst  . BACK SURGERY     2012, 2015 (3 total)  . BACK SURGERY  2018   02/24/2017  . COLONOSCOPY    . EYE SURGERY     macular degeneration treatment - injections  . FEMUR IM NAIL Left 06/13/2018   Procedure: INTRAMEDULLARY (IM) NAIL FEMORAL;  Surgeon: [name], MD;  [facility];  Service: Orthopedics;  Laterality: Left;  . OVARIAN CYST SURGERY    . POSTERIOR LUMBAR FUSION 4 LEVEL N/A 03/17/2017   Procedure: Decompression of Lumbar One-Two with Thoracic Ten to Lumbar Two Fusion;  Surgeon: Kristeen Miss, MD;  Location: [facility];  Service: Neurosurgery;  Laterality: N/A;  Decompression of L1-2 with T10 to L2 Fusion    There were no vitals filed for this visit.  Subjective Assessment - 03/15/19 1351    Subjective  "a little pain in right knee and lower back. it's not unbearable"    Currently in Pain?  Yes    Pain Score  6     Pain Location  Back    Pain Orientation  Upper;Lower  [facility] Adult PT Treatment/Exercise - 03/15/19 0001      Neck Exercises: Machines for Strengthening   Cybex Row  20# 2 x 15    Lat Pull  20# 2 x 15      Neck Exercises: Seated   Other Seated Exercise  lumbar extension, ab sets, and OHP  2 x 10       Knee/Hip Exercises: Aerobic   Nustep  level 5 x 7 minutes      Traction   Type of Traction  Cervical    Max (lbs)  12    Hold Time  12    Time  12               PT Short Term Goals - 03/15/19 1352      PT SHORT TERM GOAL #1   Title  independent with initial HEP    Status  Achieved      PT SHORT TERM GOAL #4   Title  The patient will report neck pain < or equal to 3/10 (from baseline of 5/10).    Status  Partially Met        PT Long Term Goals - 03/10/19 1405      PT LONG TERM GOAL #1    Title  decrease pain 50%    Status  Achieved      PT LONG TERM GOAL #2   Title  get up from sitting without pain and without hands    Status  Partially Met      PT LONG TERM GOAL #4   Title  report 50% less HA    Status  Achieved            Plan - 03/15/19 1422    Clinical Impression Statement  pt arrived 10 minutes late today. pt tolerated treatment well as demostrated by no increase of pain with interventions. she was able to increase the weight with rows and lat pulls. pt agreed to try traction again. it did not aggravate her asthma today.    Personal Factors and Comorbidities  Comorbidity 3+    Comorbidities  DJD right knee, 5 lumbar surgeries, osteoporosis    Stability/Clinical Decision Making  Evolving/Moderate complexity    Rehab Potential  Good    PT Frequency  3x / week    PT Duration  8 weeks    PT Treatment/Interventions  ADLs/Self Care Home Management;Electrical Stimulation;Iontophoresis '4mg'$ /ml Dexamethasone;Moist Heat;Ultrasound;Functional mobility training;Stair training;Gait training;Therapeutic activities;Therapeutic exercise;Balance training;Neuromuscular re-education;Manual techniques;Patient/family education;Traction;Cryotherapy;Dry needling    PT Next Visit Plan  Postural strengthening and cervical ROM       Patient will benefit from skilled therapeutic intervention in order to improve the following deficits and impairments:  Pain, Postural dysfunction, Increased muscle spasms, Cardiopulmonary status limiting activity, Decreased activity tolerance, Decreased endurance, Decreased range of motion, Decreased strength, Decreased balance, Difficulty walking, Impaired flexibility, Improper body mechanics, Impaired UE functional use  Visit Diagnosis: Cervicalgia  Acute bilateral low back pain without sciatica     Problem List Patient Active Problem List   Diagnosis Date Noted  . Fracture, proximal femur ([place]) 06/12/2018  . Femur fracture, left ([place] [place])  06/12/2018  . Acute blood loss as cause of postoperative anemia 03/30/2017  . Diabetic polyneuropathy associated with secondary diabetes mellitus ([facility]) 03/30/2017  . Asthma 03/30/2017  . Dyslipidemia 03/26/2017  . Lumbar stenosis with neurogenic claudication 03/17/2017  . Spinal stenosis of lumbar region 03/03/2017  . Myoclonic jerking   . Nausea   . Acute  encephalopathy 07/15/2016  . Lumbar radiculopathy 06/27/2016  . Hypothyroidism 06/26/2016  . Labile blood glucose   . Surgery, elective   . Post-operative pain   . Sickle cell trait ([facility])   . Acute blood loss anemia   . History of back surgery   . AKI (acute kidney injury) ([facility])   . Herniated nucleus pulposus, L2-3 06/25/2016  . Bilateral primary osteoarthritis of knee 03/26/2016  . Osteoarthritis, hand 03/26/2016  . Other insomnia 03/26/2016  . Fibromyalgia 09/27/2014  . Nocturnal leg cramps 09/27/2014  . Body mass index (BMI) of 30.0-30.9 in adult 08/04/2014  . Neuropathy 03/10/2014  . Allergic rhinitis 03/10/2014  . Osteoporosis 03/10/2014  . Spinal stenosis of lumbar region with radiculopathy 02/28/2014  . Type 2 diabetes mellitus with complication, with long-term current use of insulin ([place]) 07/08/2013  . Hypokalemia 11/02/2012  . Essential hypertension, benign 11/02/2012  . Diabetes mellitus with neuropathy ([facility]) 10/29/2012  . Hyperlipidemia 10/29/2012  . Spondylolisthesis of lumbar region 03/19/2011  . Lumbar radicular pain 03/19/2011    Barrett Henle, [place] 03/15/2019, 2:28 PM  [place] [place], [place], [address] Phone: [phone]   Fax:  [phone]  Name: [name] MRN: [id number] Date of Birth: 02/28/1941

## 2019-03-17 ENCOUNTER — Ambulatory Visit: Payer: Medicare Other | Admitting: Physical Therapy

## 2019-03-17 ENCOUNTER — Other Ambulatory Visit: Payer: Self-pay

## 2019-03-17 DIAGNOSIS — M542 Cervicalgia: Secondary | ICD-10-CM

## 2019-03-17 DIAGNOSIS — R252 Cramp and spasm: Secondary | ICD-10-CM | POA: Diagnosis not present

## 2019-03-17 DIAGNOSIS — M545 Low back pain: Secondary | ICD-10-CM | POA: Diagnosis not present

## 2019-03-17 NOTE — Therapy (Deleted)
[facility] [address] [place], [place], [address] Phone: [phone]   Fax:  [phone]  Physical Therapy Treatment  Patient Details  Name: [name] MRN: [id number] Date of Birth: 09/19/1940 Referring Provider (PT): Elsner   Encounter Date: 03/17/2019  PT End of Session - 03/17/19 1435    Visit Number  19    PT Start Time  1350    PT Stop Time  1443    PT Time Calculation (min)  53 min    Activity Tolerance  Patient tolerated treatment well       Past Medical History:  Diagnosis Date  . Acute blood loss anemia   . Acute encephalopathy 07/15/2016  . AKI (acute kidney injury) ([facility])   . Anemia    has sickle cell trait  . Anxiety   . Arthritis   . Asthma    has used inhaler in past for asthmatic bronchitis, last time- early 2012  . Bilateral primary osteoarthritis of knee 03/26/2016  . Complication of anesthesia    wakes up shaking  . Diabetes mellitus   . Diabetes mellitus with neuropathy ([facility]) 10/29/2012  . Diverticulitis   . Dyslipidemia   . Encephalopathy 06/2016   due to medications after surgery  . Fibromyalgia   . GERD (gastroesophageal reflux disease)    occas. use of  Prilosec  . Heart murmur    sees Dr. Spruill, last seen- early 2012  . Herniated nucleus pulposus, L2-3 06/25/2016  . History of back surgery   . Hypertension    02/2010- stress test /w PCP  . Hypothyroidism   . Loose bowel movements 12/2016  . Lumbar radiculopathy 06/27/2016  . Lumbar stenosis with neurogenic claudication 03/17/2017  . Memory disorder 02/16/2016  . Myoclonic jerking   . Neuromuscular disorder (HCC)    lumbar radiculopathy, lumbago  . Nocturnal leg cramps 09/27/2014  . Osteoporosis 03/10/2014  . Pneumonia   . Sickle cell trait ([facility])   . Sleep apnea    borderline sleep apnea, states she no longer uses, early 2012- stopped using   . Spinal stenosis of lumbar region with radiculopathy 02/28/2014  .  Spondylolisthesis of lumbar region 03/19/2011  . Type II or unspecified type diabetes mellitus without mention of complication, uncontrolled 07/08/2013    Past Surgical History:  Procedure Laterality Date  . ABDOMINAL HYSTERECTOMY    . adb.cyst     ovarian cyst  . BACK SURGERY     2012, 2015 (3 total)  . BACK SURGERY  2018   02/24/2017  . COLONOSCOPY    . EYE SURGERY     macular degeneration treatment - injections  . FEMUR IM NAIL Left 06/13/2018   Procedure: INTRAMEDULLARY (IM) NAIL FEMORAL;  Surgeon: Blackman, Christopher Y, MD;  [facility];  Service: Orthopedics;  Laterality: Left;  . OVARIAN CYST SURGERY    . POSTERIOR LUMBAR FUSION 4 LEVEL N/A 03/17/2017   Procedure: Decompression of Lumbar One-Two with Thoracic Ten to Lumbar Two Fusion;  Surgeon: Elsner, Henry, MD;  [facility];  Service: Neurosurgery;  Laterality: N/A;  Decompression of L1-2 with T10 to L2 Fusion    There were no vitals filed for this visit.  Subjective Assessment - 03/17/19 1352    Subjective  pt reports she is feeling SOB and has been wheezing today.    Currently in Pain?  Yes    Pain Score  5     Pain Location  Back                         [facility] Adult PT Treatment/Exercise - 03/17/19 0001      Neck Exercises: Machines for Strengthening   UBE (Upper Arm Bike)  L1 x 2 min forward/backward    Cybex Row  20# 2 x 15    Lat Pull  20# 2 x 15      Neck Exercises: Standing   Other Standing Exercises  shrugs and rolls 2 x 10 3#, W stretches 10 sec x 5    Other Standing Exercises  shoulder extension red tband, 2 x 10      Knee/Hip Exercises: Aerobic   Nustep  level 5 x 7 minutes      Traction   Type of Traction  Cervical    Max (lbs)  12    Hold Time  12    Time  12               PT Short Term Goals - 03/15/19 1352      PT SHORT TERM GOAL #1   Title  independent with initial HEP    Status  Achieved      PT SHORT TERM GOAL #4   Title  The patient will report neck  pain < or equal to 3/10 (from baseline of 5/10).    Status  Partially Met        PT Long Term Goals - 03/17/19 1357      PT LONG TERM GOAL #1   Title  decrease pain 50%      PT LONG TERM GOAL #2   Title  get up from sitting without pain and without hands    Status  Achieved            Plan - 03/17/19 1431    Clinical Impression Statement  pt arrived 5 minutes late. pt very tight with W wall stretches. pt needs cues with shoulder extensions to improve form and technique. exercises kept the same due to pt SOB. pt agreed to do both traction and stim today.    Personal Factors and Comorbidities  Comorbidity 3+    Comorbidities  DJD right knee, 5 lumbar surgeries, osteoporosis    Stability/Clinical Decision Making  Evolving/Moderate complexity    Rehab Potential  Good    PT Frequency  3x / week    PT Treatment/Interventions  ADLs/Self Care Home Management;Electrical Stimulation;Iontophoresis '4mg'$ /ml Dexamethasone;Moist Heat;Ultrasound;Functional mobility training;Stair training;Gait training;Therapeutic activities;Therapeutic exercise;Balance training;Neuromuscular re-education;Manual techniques;Patient/family education;Traction;Cryotherapy;Dry needling    PT Next Visit Plan  Postural strengthening and cervical ROM       Patient will benefit from skilled therapeutic intervention in order to improve the following deficits and impairments:  Pain, Postural dysfunction, Increased muscle spasms, Cardiopulmonary status limiting activity, Decreased activity tolerance, Decreased endurance, Decreased range of motion, Decreased strength, Decreased balance, Difficulty walking, Impaired flexibility, Improper body mechanics, Impaired UE functional use  Visit Diagnosis: Cervicalgia     Problem List Patient Active Problem List   Diagnosis Date Noted  . Fracture, proximal femur ([place]) 06/12/2018  . Femur fracture, left ([place]) 06/12/2018  . Acute blood loss as cause of postoperative anemia  03/30/2017  . Diabetic polyneuropathy associated with secondary diabetes mellitus ([place]) 03/30/2017  . Asthma 03/30/2017  . Dyslipidemia 03/26/2017  . Lumbar stenosis with neurogenic claudication 03/17/2017  . Spinal stenosis of lumbar region 03/03/2017  . Myoclonic jerking   . Nausea   . Acute encephalopathy 07/15/2016  . Lumbar radiculopathy 06/27/2016  . Hypothyroidism 06/26/2016  . Labile blood glucose   . Surgery, elective   .  Post-operative pain   . Sickle cell trait ([place])   . Acute blood loss anemia   . History of back surgery   . AKI (acute kidney injury) ([place])   . Herniated nucleus pulposus, L2-3 06/25/2016  . Bilateral primary osteoarthritis of knee 03/26/2016  . Osteoarthritis, hand 03/26/2016  . Other insomnia 03/26/2016  . Fibromyalgia 09/27/2014  . Nocturnal leg cramps 09/27/2014  . Body mass index (BMI) of 30.0-30.9 in adult 08/04/2014  . Neuropathy 03/10/2014  . Allergic rhinitis 03/10/2014  . Osteoporosis 03/10/2014  . Spinal stenosis of lumbar region with radiculopathy 02/28/2014  . Type 2 diabetes mellitus with complication, with long-term current use of insulin ([place]) 07/08/2013  . Hypokalemia 11/02/2012  . Essential hypertension, benign 11/02/2012  . Diabetes mellitus with neuropathy ([place]) 10/29/2012  . Hyperlipidemia 10/29/2012  . Spondylolisthesis of lumbar region 03/19/2011  . Lumbar radicular pain 03/19/2011    Barrett Henle 03/17/2019, 2:44 PM  [place], [place], [address] Phone: [phone]   Fax:  [phone]  Name: MACK THURMON MRN: [id number] Date of Birth: [birth year]

## 2019-03-17 NOTE — Therapy (Signed)
[facility] [address] [place], [place], [address] Phone: [phone]   Fax:  [phone]  Physical Therapy Treatment  Patient Details  Name: [name] MRN: [id number] Date of Birth: 03/23/1941 Referring Provider (PT): Elsner   Encounter Date: 03/17/2019  PT End of Session - 03/17/19 1435    Visit Number  19    PT Start Time  1350    PT Stop Time  1443    PT Time Calculation (min)  53 min    Activity Tolerance  Patient tolerated treatment well       Past Medical History:  Diagnosis Date  . Acute blood loss anemia   . Acute encephalopathy 07/15/2016  . AKI (acute kidney injury) (HCC)   . Anemia    has sickle cell trait  . Anxiety   . Arthritis   . Asthma    has used inhaler in past for asthmatic bronchitis, last time- early 2012  . Bilateral primary osteoarthritis of knee 03/26/2016  . Complication of anesthesia    wakes up shaking  . Diabetes mellitus   . Diabetes mellitus with neuropathy (HCC) 10/29/2012  . Diverticulitis   . Dyslipidemia   . Encephalopathy 06/2016   due to medications after surgery  . Fibromyalgia   . GERD (gastroesophageal reflux disease)    occas. use of  Prilosec  . Heart murmur    sees Dr. Spruill, last seen- early 2012  . Herniated nucleus pulposus, L2-3 06/25/2016  . History of back surgery   . Hypertension    02/2010- stress test /w PCP  . Hypothyroidism   . Loose bowel movements 12/2016  . Lumbar radiculopathy 06/27/2016  . Lumbar stenosis with neurogenic claudication 03/17/2017  . Memory disorder 02/16/2016  . Myoclonic jerking   . Neuromuscular disorder (HCC)    lumbar radiculopathy, lumbago  . Nocturnal leg cramps 09/27/2014  . Osteoporosis 03/10/2014  . Pneumonia   . Sickle cell trait (HCC)   . Sleep apnea    borderline sleep apnea, states she no longer uses, early 2012- stopped using   . Spinal stenosis of lumbar region with radiculopathy 02/28/2014  .  Spondylolisthesis of lumbar region 03/19/2011  . Type II or unspecified type diabetes mellitus without mention of complication, uncontrolled 07/08/2013    Past Surgical History:  Procedure Laterality Date  . ABDOMINAL HYSTERECTOMY    . adb.cyst     ovarian cyst  . BACK SURGERY     2012, 2015 (3 total)  . BACK SURGERY  2018   02/24/2017  . COLONOSCOPY    . EYE SURGERY     macular degeneration treatment - injections  . FEMUR IM NAIL Left 06/13/2018   Procedure: INTRAMEDULLARY (IM) NAIL FEMORAL;  Surgeon: Blackman, Christopher Y, MD;  Location: [facility];  Service: Orthopedics;  Laterality: Left;  . OVARIAN CYST SURGERY    . POSTERIOR LUMBAR FUSION 4 LEVEL N/A 03/17/2017   Procedure: Decompression of Lumbar One-Two with Thoracic Ten to Lumbar Two Fusion;  Surgeon: Elsner, Henry, MD;  [facility];  Service: Neurosurgery;  Laterality: N/A;  Decompression of L1-2 with T10 to L2 Fusion    There were no vitals filed for this visit.  Subjective Assessment - 03/17/19 1352    Subjective  pt reports she is feeling SOB and has been wheezing today.    Currently in Pain?  Yes    Pain Score  5     Pain Location  Back                         [facility] Adult PT Treatment/Exercise - 03/17/19 0001      Neck Exercises: Machines for Strengthening   UBE (Upper Arm Bike)  L1 x 2 min forward/backward    Cybex Row  20# 2 x 15    Lat Pull  20# 2 x 15      Neck Exercises: Standing   Other Standing Exercises  shrugs and rolls 2 x 10 3#, W stretches 10 sec x 5    Other Standing Exercises  shoulder extension red tband, 2 x 10      Knee/Hip Exercises: Aerobic   Nustep  level 5 x 7 minutes      Traction   Type of Traction  Cervical    Max (lbs)  12    Hold Time  12    Time  12               PT Short Term Goals - 03/15/19 1352      PT SHORT TERM GOAL #1   Title  independent with initial HEP    Status  Achieved      PT SHORT TERM GOAL #4   Title  The patient will report neck  pain < or equal to 3/10 (from baseline of 5/10).    Status  Partially Met        PT Long Term Goals - 03/17/19 1357      PT LONG TERM GOAL #1   Title  decrease pain 50%      PT LONG TERM GOAL #2   Title  get up from sitting without pain and without hands    Status  Achieved            Plan - 03/17/19 1431    Clinical Impression Statement  pt arrived 5 minutes late. pt very tight with W wall stretches. pt needs cues with shoulder extensions to improve form and technique. exercises kept the same due to pt SOB. pt agreed to do to traction today. pt states it seems to be helping.    Personal Factors and Comorbidities  Comorbidity 3+    Comorbidities  DJD right knee, 5 lumbar surgeries, osteoporosis    Stability/Clinical Decision Making  Evolving/Moderate complexity    Rehab Potential  Good    PT Frequency  3x / week    PT Treatment/Interventions  ADLs/Self Care Home Management;Electrical Stimulation;Iontophoresis 4mg/ml Dexamethasone;Moist Heat;Ultrasound;Functional mobility training;Stair training;Gait training;Therapeutic activities;Therapeutic exercise;Balance training;Neuromuscular re-education;Manual techniques;Patient/family education;Traction;Cryotherapy;Dry needling    PT Next Visit Plan  Postural strengthening and cervical ROM       Patient will benefit from skilled therapeutic intervention in order to improve the following deficits and impairments:  Pain, Postural dysfunction, Increased muscle spasms, Cardiopulmonary status limiting activity, Decreased activity tolerance, Decreased endurance, Decreased range of motion, Decreased strength, Decreased balance, Difficulty walking, Impaired flexibility, Improper body mechanics, Impaired UE functional use  Visit Diagnosis: Cervicalgia     Problem List Patient Active Problem List   Diagnosis Date Noted  . Fracture, proximal femur (HCC) 06/12/2018  . Femur fracture, left (HCC) 06/12/2018  . Acute blood loss as cause of  postoperative anemia 03/30/2017  . Diabetic polyneuropathy associated with secondary diabetes mellitus (HCC) 03/30/2017  . Asthma 03/30/2017  . Dyslipidemia 03/26/2017  . Lumbar stenosis with neurogenic claudication 03/17/2017  . Spinal stenosis of lumbar region 03/03/2017  . Myoclonic jerking   . Nausea   . Acute encephalopathy 07/15/2016  . Lumbar radiculopathy 06/27/2016  . Hypothyroidism 06/26/2016  . Labile blood glucose   .   Surgery, elective   . Post-operative pain   . Sickle cell trait (HCC)   . Acute blood loss anemia   . History of back surgery   . AKI (acute kidney injury) (HCC)   . Herniated nucleus pulposus, L2-3 06/25/2016  . Bilateral primary osteoarthritis of knee 03/26/2016  . Osteoarthritis, hand 03/26/2016  . Other insomnia 03/26/2016  . Fibromyalgia 09/27/2014  . Nocturnal leg cramps 09/27/2014  . Body mass index (BMI) of 30.0-30.9 in adult 08/04/2014  . Neuropathy 03/10/2014  . Allergic rhinitis 03/10/2014  . Osteoporosis 03/10/2014  . Spinal stenosis of lumbar region with radiculopathy 02/28/2014  . Type 2 diabetes mellitus with complication, with long-term current use of insulin (HCC) 07/08/2013  . Hypokalemia 11/02/2012  . Essential hypertension, benign 11/02/2012  . Diabetes mellitus with neuropathy (HCC) 10/29/2012  . Hyperlipidemia 10/29/2012  . Spondylolisthesis of lumbar region 03/19/2011  . Lumbar radicular pain 03/19/2011     , SPTA 03/17/2019, 2:49 PM  [facility] [address] [place], [place], [address] Phone: [phone]   Fax:  [phone]  Name: [name] MRN: [id number] Date of Birth: 03/31/1941   

## 2019-03-22 ENCOUNTER — Ambulatory Visit: Payer: Medicare Other

## 2019-03-23 ENCOUNTER — Ambulatory Visit: Payer: Medicare Other | Admitting: Physical Therapy

## 2019-03-23 DIAGNOSIS — Z803 Family history of malignant neoplasm of breast: Secondary | ICD-10-CM | POA: Diagnosis not present

## 2019-03-23 DIAGNOSIS — Z1231 Encounter for screening mammogram for malignant neoplasm of breast: Secondary | ICD-10-CM | POA: Diagnosis not present

## 2019-03-24 ENCOUNTER — Ambulatory Visit: Payer: Medicare Other | Attending: Orthopaedic Surgery | Admitting: Physical Therapy

## 2019-03-24 ENCOUNTER — Other Ambulatory Visit: Payer: Self-pay

## 2019-03-24 DIAGNOSIS — R252 Cramp and spasm: Secondary | ICD-10-CM | POA: Insufficient documentation

## 2019-03-24 DIAGNOSIS — M542 Cervicalgia: Secondary | ICD-10-CM | POA: Diagnosis present

## 2019-03-24 DIAGNOSIS — M545 Low back pain: Secondary | ICD-10-CM | POA: Diagnosis present

## 2019-03-24 NOTE — Therapy (Addendum)
[facility], [place], [address] Phone: [phone]   Fax:  [phone] During this treatment session, the therapist was present, participating in and directing the treatment. Progress Note Reporting Period 02/16/19 to 03/24/19 for visits 11-20  See note below for Objective Data and Assessment of Progress/Goals.      Physical Therapy Treatment  Patient Details  Name: [name] MRN: [id number] Date of Birth: 11-07-1940 Referring Provider (PT): Elsner   Encounter Date: 03/24/2019  PT End of Session - 03/24/19 1224    Visit Number  20    PT Start Time  4081    PT Stop Time  4481    PT Time Calculation (min)  51 min       Past Medical History:  Diagnosis Date  . Acute blood loss anemia   . Acute encephalopathy 07/15/2016  . AKI (acute kidney injury) ([place])   . Anemia    has sickle cell trait  . Anxiety   . Arthritis   . Asthma    has used inhaler in past for asthmatic bronchitis, last time- early 2012  . Bilateral primary osteoarthritis of knee 03/26/2016  . Complication of anesthesia    wakes up shaking  . Diabetes mellitus   . Diabetes mellitus with neuropathy ([place]) 10/29/2012  . Diverticulitis   . Dyslipidemia   . Encephalopathy 06/2016   due to medications after surgery  . Fibromyalgia   . GERD (gastroesophageal reflux disease)    occas. use of  Prilosec  . Heart murmur    sees Dr. Montez Morita, last seen- early 2012  . Herniated nucleus pulposus, L2-3 06/25/2016  . History of back surgery   . Hypertension    02/2010- stress test /w PCP  . Hypothyroidism   . Loose bowel movements 12/2016  . Lumbar radiculopathy 06/27/2016  . Lumbar stenosis with neurogenic claudication 03/17/2017  . Memory disorder 02/16/2016  . Myoclonic jerking   . Neuromuscular disorder (HCC)    lumbar radiculopathy, lumbago  . Nocturnal leg cramps 09/27/2014  . Osteoporosis 03/10/2014  . Pneumonia   .  Sickle cell trait ([place])   . Sleep apnea    borderline sleep apnea, states she no longer uses, early 2012- stopped using   . Spinal stenosis of lumbar region with radiculopathy 02/28/2014  . Spondylolisthesis of lumbar region 03/19/2011  . Type II or unspecified type diabetes mellitus without mention of complication, uncontrolled 07/08/2013    Past Surgical History:  Procedure Laterality Date  . ABDOMINAL HYSTERECTOMY    . adb.cyst     ovarian cyst  . BACK SURGERY     2012, 2015 (3 total)  . BACK SURGERY  2018   02/24/2017  . COLONOSCOPY    . EYE SURGERY     macular degeneration treatment - injections  . FEMUR IM NAIL Left 06/13/2018   Procedure: INTRAMEDULLARY (IM) NAIL FEMORAL;  Surgeon: [name], MD;  [facility]: [facility];  Service: Orthopedics;  Laterality: Left;  . OVARIAN CYST SURGERY    . POSTERIOR LUMBAR FUSION 4 LEVEL N/A 03/17/2017   Procedure: Decompression of Lumbar One-Two with Thoracic Ten to Lumbar Two Fusion;  Surgeon: Kristeen Miss, MD;  Location: [facility];  Service: Neurosurgery;  Laterality: N/A;  Decompression of L1-2 with T10 to L2 Fusion    There were no vitals filed for this visit.  Subjective Assessment - 03/24/19 1149    Subjective  "feeling good, just a little  tired. i did not sleep well last night."    Currently in Pain?  Yes    Pain Score  6     Pain Location  Back    Pain Orientation  Upper;Lower                       [facility] Adult PT Treatment/Exercise - 03/24/19 0001      Neck Exercises: Machines for Strengthening   Cybex Row  20# 2 x 15    Lat Pull  20# 2 x 15    Other Machines for Strengthening  back Ext black band 2x10      Neck Exercises: Standing   Other Standing Exercises  T and W stretches x 4, 10 sec      Neck Exercises: Seated   Other Seated Exercise  ab sets, and chest press  2 x 10       Knee/Hip Exercises: Aerobic   Nustep  level 5 x 7 minutes      Traction   Type of Traction  Cervical    Max (lbs)  12     Hold Time  12    Time  12               PT Short Term Goals - 03/15/19 1352      PT SHORT TERM GOAL #1   Title  independent with initial HEP    Status  Achieved      PT SHORT TERM GOAL #4   Title  The patient will report neck pain < or equal to 3/10 (from baseline of 5/10).    Status  Partially Met        PT Long Term Goals - 03/17/19 1357      PT LONG TERM GOAL #1   Title  decrease pain 50%      PT LONG TERM GOAL #2   Title  get up from sitting without pain and without hands    Status  Achieved            Plan - 03/24/19 1231    Clinical Impression Statement  pt is very tight with T and W wall stretches. pt continues to tolerate traction well and states that it seems to reduce her pain. pt attempted OHP, but was unable to do due to pain. she was able to complete chest press. pt needs cues with lumbar extension to increase form and technique.    Comorbidities  DJD right knee, 5 lumbar surgeries, osteoporosis    Stability/Clinical Decision Making  Evolving/Moderate complexity    Rehab Potential  Good    PT Frequency  3x / week    PT Duration  8 weeks    PT Treatment/Interventions  ADLs/Self Care Home Management;Electrical Stimulation;Iontophoresis 66m/ml Dexamethasone;Moist Heat;Ultrasound;Functional mobility training;Stair training;Gait training;Therapeutic activities;Therapeutic exercise;Balance training;Neuromuscular re-education;Manual techniques;Patient/family education;Traction;Cryotherapy;Dry needling    PT Next Visit Plan  Postural strengthening and cervical ROM       Patient will benefit from skilled therapeutic intervention in order to improve the following deficits and impairments:  Pain, Postural dysfunction, Increased muscle spasms, Cardiopulmonary status limiting activity, Decreased activity tolerance, Decreased endurance, Decreased range of motion, Decreased strength, Decreased balance, Difficulty walking, Impaired flexibility, Improper body  mechanics, Impaired UE functional use  Visit Diagnosis: Cervicalgia     Problem List Patient Active Problem List   Diagnosis Date Noted  . Fracture, proximal femur ([place] 06/12/2018  . Femur fracture, left ([place] 06/12/2018  . Acute  blood loss as cause of postoperative anemia 03/30/2017  . Diabetic polyneuropathy associated with secondary diabetes mellitus ([place]) 03/30/2017  . Asthma 03/30/2017  . Dyslipidemia 03/26/2017  . Lumbar stenosis with neurogenic claudication 03/17/2017  . Spinal stenosis of lumbar region 03/03/2017  . Myoclonic jerking   . Nausea   . Acute encephalopathy 07/15/2016  . Lumbar radiculopathy 06/27/2016  . Hypothyroidism 06/26/2016  . Labile blood glucose   . Surgery, elective   . Post-operative pain   . Sickle cell trait ([place])   . Acute blood loss anemia   . History of back surgery   . AKI (acute kidney injury) ([place])   . Herniated nucleus pulposus, L2-3 06/25/2016  . Bilateral primary osteoarthritis of knee 03/26/2016  . Osteoarthritis, hand 03/26/2016  . Other insomnia 03/26/2016  . Fibromyalgia 09/27/2014  . Nocturnal leg cramps 09/27/2014  . Body mass index (BMI) of 30.0-30.9 in adult 08/04/2014  . Neuropathy 03/10/2014  . Allergic rhinitis 03/10/2014  . Osteoporosis 03/10/2014  . Spinal stenosis of lumbar region with radiculopathy 02/28/2014  . Type 2 diabetes mellitus with complication, with long-term current use of insulin ([place]) 07/08/2013  . Hypokalemia 11/02/2012  . Essential hypertension, benign 11/02/2012  . Diabetes mellitus with neuropathy ([place]) 10/29/2012  . Hyperlipidemia 10/29/2012  . Spondylolisthesis of lumbar region 03/19/2011  . Lumbar radicular pain 03/19/2011    Barrett Henle, [place] 03/24/2019, 12:34 PM  [place], [place], [address] Phone: [phone]   Fax:  [phone]  Name: [name] MRN: [id number] Date of Birth:  1940/12/01

## 2019-03-25 ENCOUNTER — Encounter: Payer: Self-pay | Admitting: Physical Therapy

## 2019-03-25 ENCOUNTER — Ambulatory Visit: Payer: Medicare Other | Admitting: Physical Therapy

## 2019-03-25 DIAGNOSIS — M542 Cervicalgia: Secondary | ICD-10-CM

## 2019-03-25 DIAGNOSIS — R252 Cramp and spasm: Secondary | ICD-10-CM

## 2019-03-25 DIAGNOSIS — M545 Low back pain, unspecified: Secondary | ICD-10-CM

## 2019-03-25 NOTE — Therapy (Signed)
[place], [place], [address] Phone: [phone]   Fax:  [phone]  Physical Therapy Treatment  Patient Details  Name: [name] MRN: [id number] Date of Birth: 1940/07/27 Referring Provider (PT): Elsner   Encounter Date: 03/25/2019  PT End of Session - 03/25/19 1434    Visit Number  21    Date for PT Re-Evaluation  03/22/19    Authorization Type  Medicare    PT Start Time  [id number]    PT Stop Time  1447    PT Time Calculation (min)  62 min    Activity Tolerance  Patient tolerated treatment well    Behavior During Therapy  [facility] for tasks assessed/performed       Past Medical History:  Diagnosis Date  . Acute blood loss anemia   . Acute encephalopathy 07/15/2016  . AKI (acute kidney injury) ([place])   . Anemia    has sickle cell trait  . Anxiety   . Arthritis   . Asthma    has used inhaler in past for asthmatic bronchitis, last time- early 2012  . Bilateral primary osteoarthritis of knee 03/26/2016  . Complication of anesthesia    wakes up shaking  . Diabetes mellitus   . Diabetes mellitus with neuropathy ([place]) 10/29/2012  . Diverticulitis   . Dyslipidemia   . Encephalopathy 06/2016   due to medications after surgery  . Fibromyalgia   . GERD (gastroesophageal reflux disease)    occas. use of  Prilosec  . Heart murmur    sees Dr. Montez Morita, last seen- early 2012  . Herniated nucleus pulposus, L2-3 06/25/2016  . History of back surgery   . Hypertension    02/2010- stress test /w PCP  . Hypothyroidism   . Loose bowel movements 12/2016  . Lumbar radiculopathy 06/27/2016  . Lumbar stenosis with neurogenic claudication 03/17/2017  . Memory disorder 02/16/2016  . Myoclonic jerking   . Neuromuscular disorder (HCC)    lumbar radiculopathy, lumbago  . Nocturnal leg cramps 09/27/2014  . Osteoporosis 03/10/2014  . Pneumonia   . Sickle cell trait ([place])   . Sleep apnea    borderline sleep  apnea, states she no longer uses, early 2012- stopped using   . Spinal stenosis of lumbar region with radiculopathy 02/28/2014  . Spondylolisthesis of lumbar region 03/19/2011  . Type II or unspecified type diabetes mellitus without mention of complication, uncontrolled 07/08/2013    Past Surgical History:  Procedure Laterality Date  . ABDOMINAL HYSTERECTOMY    . adb.cyst     ovarian cyst  . BACK SURGERY     2012, 2015 (3 total)  . BACK SURGERY  2018   02/24/2017  . COLONOSCOPY    . EYE SURGERY     macular degeneration treatment - injections  . FEMUR IM NAIL Left 06/13/2018   Procedure: INTRAMEDULLARY (IM) NAIL FEMORAL;  Surgeon: [name], MD;  [facility];  Service: Orthopedics;  Laterality: Left;  . OVARIAN CYST SURGERY    . POSTERIOR LUMBAR FUSION 4 LEVEL N/A 03/17/2017   Procedure: Decompression of Lumbar One-Two with Thoracic Ten to Lumbar Two Fusion;  Surgeon: Kristeen Miss, MD;  Location: [facility];  Service: Neurosurgery;  Laterality: N/A;  Decompression of L1-2 with T10 to L2 Fusion    There were no vitals filed for this visit.  Subjective Assessment - 03/25/19 1350    Subjective  "I slept good last night" still has  some pain around the bottom of R scapula that has not changed.    Currently in Pain?  Yes    Pain Score  8     Pain Location  Back    Pain Orientation  Right;Mid                       [facility] Adult PT Treatment/Exercise - 03/25/19 0001      Neck Exercises: Machines for Strengthening   Cybex Row  15lb 2x10    Lat Pull       Other Machines for Strengthening  back Ext black band 2x10      Neck Exercises: Standing   Other Standing Exercises  Shoulder ER red 2x10       Neck Exercises: Seated   Neck Retraction  10 reps;3 secs;20 reps    Other Seated Exercise  horizontal abd red 2x10; OHP 2lb 2x10     Other Seated Exercise  ab sets with pball 2x10; Shrugs 6lb 2x10       Knee/Hip Exercises: Aerobic   Nustep  level 4 x 7 minutes       Knee/Hip Exercises: Seated   Sit to Sand  10 reps;2 sets;with UE support;without UE support      Moist Heat Therapy   Number Minutes Moist Heat  15 Minutes    Moist Heat Location  Cervical      Electrical Stimulation   Electrical Stimulation Location  cervical & mid back    Electrical Stimulation Action  pre mod    Electrical Stimulation Parameters  sitting    Electrical Stimulation Goals  Pain               PT Short Term Goals - 03/25/19 1352      PT SHORT TERM GOAL #1   Status  Achieved        PT Long Term Goals - 03/25/19 1352      PT LONG TERM GOAL #1   Title  decrease pain 50%    Status  On-going            Plan - 03/25/19 1434    Clinical Impression Statement  Pt reports some improvement in her neck overall. Some pain reported with turing her neck to back up her car. She also reports  some pain near the bottom of her R scapula. All interventions completed well, some pain with cervical retractions.    Comorbidities  DJD right knee, 5 lumbar surgeries, osteoporosis    Stability/Clinical Decision Making  Evolving/Moderate complexity    Rehab Potential  Good    PT Frequency  3x / week    PT Duration  8 weeks    PT Treatment/Interventions  ADLs/Self Care Home Management;Electrical Stimulation;Iontophoresis 4mg /ml Dexamethasone;Moist Heat;Ultrasound;Functional mobility training;Stair training;Gait training;Therapeutic activities;Therapeutic exercise;Balance training;Neuromuscular re-education;Manual techniques;Patient/family education;Traction;Cryotherapy;Dry needling    PT Next Visit Plan  Postural strengthening and cervical ROM       Patient will benefit from skilled therapeutic intervention in order to improve the following deficits and impairments:  Pain, Postural dysfunction, Increased muscle spasms, Cardiopulmonary status limiting activity, Decreased activity tolerance, Decreased endurance, Decreased range of motion, Decreased strength, Decreased  balance, Difficulty walking, Impaired flexibility, Improper body mechanics, Impaired UE functional use  Visit Diagnosis: Acute bilateral low back pain without sciatica  Cervicalgia  Cramp and spasm     Problem List Patient Active Problem List   Diagnosis Date Noted  . Fracture, proximal femur ([place]) 06/12/2018  .  Femur fracture, left ([place]) 06/12/2018  . Acute blood loss as cause of postoperative anemia 03/30/2017  . Diabetic polyneuropathy associated with secondary diabetes mellitus ([place]) 03/30/2017  . Asthma 03/30/2017  . Dyslipidemia 03/26/2017  . Lumbar stenosis with neurogenic claudication 03/17/2017  . Spinal stenosis of lumbar region 03/03/2017  . Myoclonic jerking   . Nausea   . Acute encephalopathy 07/15/2016  . Lumbar radiculopathy 06/27/2016  . Hypothyroidism 06/26/2016  . Labile blood glucose   . Surgery, elective   . Post-operative pain   . Sickle cell trait ([place])   . Acute blood loss anemia   . History of back surgery   . AKI (acute kidney injury) ([place])   . Herniated nucleus pulposus, L2-3 06/25/2016  . Bilateral primary osteoarthritis of knee 03/26/2016  . Osteoarthritis, hand 03/26/2016  . Other insomnia 03/26/2016  . Fibromyalgia 09/27/2014  . Nocturnal leg cramps 09/27/2014  . Body mass index (BMI) of 30.0-30.9 in adult 08/04/2014  . Neuropathy 03/10/2014  . Allergic rhinitis 03/10/2014  . Osteoporosis 03/10/2014  . Spinal stenosis of lumbar region with radiculopathy 02/28/2014  . Type 2 diabetes mellitus with complication, with long-term current use of insulin ([place]) 07/08/2013  . Hypokalemia 11/02/2012  . Essential hypertension, benign 11/02/2012  . Diabetes mellitus with neuropathy ([place]) 10/29/2012  . Hyperlipidemia 10/29/2012  . Spondylolisthesis of lumbar region 03/19/2011  . Lumbar radicular pain 03/19/2011    Scot Jun, PTA 03/25/2019, 2:45 PM  [place], [place], [address] Phone: [phone]   Fax:  [phone]  Name: ELVIS ARRELLANO MRN: [id number] Date of Birth: [birth year]

## 2019-03-29 ENCOUNTER — Other Ambulatory Visit: Payer: Self-pay

## 2019-03-29 ENCOUNTER — Ambulatory Visit: Payer: Medicare Other | Admitting: Physical Therapy

## 2019-03-29 ENCOUNTER — Encounter: Payer: Self-pay | Admitting: Physical Therapy

## 2019-03-29 DIAGNOSIS — M545 Low back pain, unspecified: Secondary | ICD-10-CM

## 2019-03-29 DIAGNOSIS — R252 Cramp and spasm: Secondary | ICD-10-CM

## 2019-03-29 DIAGNOSIS — M542 Cervicalgia: Secondary | ICD-10-CM | POA: Diagnosis not present

## 2019-03-29 NOTE — Therapy (Signed)
[place], [place], [address] Phone: [phone]   Fax:  [phone]  Physical Therapy Treatment  Patient Details  Name: [name] MRN: [id number] Date of Birth: 1940/08/30 Referring Provider (PT): Elsner   Encounter Date: 03/29/2019  PT End of Session - 03/29/19 1351    Visit Number  22    Date for PT Re-Evaluation  03/22/19    Authorization Type  Medicare    PT Start Time  1300    PT Stop Time  [id number]    PT Time Calculation (min)  58 min       Past Medical History:  Diagnosis Date  . Acute blood loss anemia   . Acute encephalopathy 07/15/2016  . AKI (acute kidney injury) ([place])   . Anemia    has sickle cell trait  . Anxiety   . Arthritis   . Asthma    has used inhaler in past for asthmatic bronchitis, last time- early 2012  . Bilateral primary osteoarthritis of knee 03/26/2016  . Complication of anesthesia    wakes up shaking  . Diabetes mellitus   . Diabetes mellitus with neuropathy ([place]) 10/29/2012  . Diverticulitis   . Dyslipidemia   . Encephalopathy 06/2016   due to medications after surgery  . Fibromyalgia   . GERD (gastroesophageal reflux disease)    occas. use of  Prilosec  . Heart murmur    sees Dr. Montez Morita, last seen- early 2012  . Herniated nucleus pulposus, L2-3 06/25/2016  . History of back surgery   . Hypertension    02/2010- stress test /w PCP  . Hypothyroidism   . Loose bowel movements 12/2016  . Lumbar radiculopathy 06/27/2016  . Lumbar stenosis with neurogenic claudication 03/17/2017  . Memory disorder 02/16/2016  . Myoclonic jerking   . Neuromuscular disorder (HCC)    lumbar radiculopathy, lumbago  . Nocturnal leg cramps 09/27/2014  . Osteoporosis 03/10/2014  . Pneumonia   . Sickle cell trait ([place])   . Sleep apnea    borderline sleep apnea, states she no longer uses, early 2012- stopped using   . Spinal stenosis of lumbar region with radiculopathy  02/28/2014  . Spondylolisthesis of lumbar region 03/19/2011  . Type II or unspecified type diabetes mellitus without mention of complication, uncontrolled 07/08/2013    Past Surgical History:  Procedure Laterality Date  . ABDOMINAL HYSTERECTOMY    . adb.cyst     ovarian cyst  . BACK SURGERY     2012, 2015 (3 total)  . BACK SURGERY  2018   02/24/2017  . COLONOSCOPY    . EYE SURGERY     macular degeneration treatment - injections  . FEMUR IM NAIL Left 06/13/2018   Procedure: INTRAMEDULLARY (IM) NAIL FEMORAL;  Surgeon: [name], MD;  [facility];  Service: Orthopedics;  Laterality: Left;  . OVARIAN CYST SURGERY    . POSTERIOR LUMBAR FUSION 4 LEVEL N/A 03/17/2017   Procedure: Decompression of Lumbar One-Two with Thoracic Ten to Lumbar Two Fusion;  Surgeon: Kristeen Miss, MD;  Location: [facility];  Service: Neurosurgery;  Laterality: N/A;  Decompression of L1-2 with T10 to L2 Fusion    There were no vitals filed for this visit.  Subjective Assessment - 03/29/19 1303    Subjective  "I am ok""    Pertinent History  has had 5 lumbar surgeries, osteoporosis, right knee DJD    Currently in Pain?  Yes  Pain Score  7     Pain Location  Back                       [facility] Adult PT Treatment/Exercise - 03/29/19 0001      Neck Exercises: Machines for Strengthening   Cybex Row  20lb 2x10    Lat Pull  20lb x 10     Other Machines for Strengthening  back Ext black band 2x10      Neck Exercises: Standing   Other Standing Exercises  Shoulder ER red 2x10       Neck Exercises: Seated   Neck Retraction  10 reps;3 secs;20 reps    Other Seated Exercise  horizontal abd red 2x10; OHP 2lb 2x10, Standing OGP 3lb 2x10     Other Seated Exercise  ab sets with pball 2x10      Knee/Hip Exercises: Aerobic   Nustep  level 4 x 7 minutes      Knee/Hip Exercises: Machines for Strengthening   Cybex Knee Extension         Knee/Hip Exercises: Seated   Sit to Sand  10 reps;2  sets;with UE support;without UE support      Traction   Type of Traction  Cervical    Max (lbs)  15    Hold Time  12    Time  12               PT Short Term Goals - 03/25/19 1352      PT SHORT TERM GOAL #1   Status  Achieved        PT Long Term Goals - 03/25/19 1352      PT LONG TERM GOAL #1   Title  decrease pain 50%    Status  On-going            Plan - 03/29/19 1353    Clinical Impression Statement  LBP remains, pt with forward flexed posture and rounded shoulders. Some pain reported in the R arm with standing OHP. Cues to hold contraction with cervical retraction, pain reported on L side. cues to keep core engaged with ab sets.    Comorbidities  DJD right knee, 5 lumbar surgeries, osteoporosis    Stability/Clinical Decision Making  Evolving/Moderate complexity    Rehab Potential  Good    PT Frequency  3x / week    PT Duration  8 weeks    PT Next Visit Plan  Postural strengthening and cervical ROM       Patient will benefit from skilled therapeutic intervention in order to improve the following deficits and impairments:  Pain, Postural dysfunction, Increased muscle spasms, Cardiopulmonary status limiting activity, Decreased activity tolerance, Decreased endurance, Decreased range of motion, Decreased strength, Decreased balance, Difficulty walking, Impaired flexibility, Improper body mechanics, Impaired UE functional use  Visit Diagnosis: Acute bilateral low back pain without sciatica  Cervicalgia  Cramp and spasm     Problem List Patient Active Problem List   Diagnosis Date Noted  . Fracture, proximal femur ([place]) 06/12/2018  . Femur fracture, left ([place]) 06/12/2018  . Acute blood loss as cause of postoperative anemia 03/30/2017  . Diabetic polyneuropathy associated with secondary diabetes mellitus ([place]) 03/30/2017  . Asthma 03/30/2017  . Dyslipidemia 03/26/2017  . Lumbar stenosis with neurogenic claudication 03/17/2017  . Spinal stenosis of  lumbar region 03/03/2017  . Myoclonic jerking   . Nausea   . Acute encephalopathy 07/15/2016  . Lumbar radiculopathy 06/27/2016  .  Hypothyroidism 06/26/2016  . Labile blood glucose   . Surgery, elective   . Post-operative pain   . Sickle cell trait ([place])   . Acute blood loss anemia   . History of back surgery   . AKI (acute kidney injury) ([place])   . Herniated nucleus pulposus, L2-3 06/25/2016  . Bilateral primary osteoarthritis of knee 03/26/2016  . Osteoarthritis, hand 03/26/2016  . Other insomnia 03/26/2016  . Fibromyalgia 09/27/2014  . Nocturnal leg cramps 09/27/2014  . Body mass index (BMI) of 30.0-30.9 in adult 08/04/2014  . Neuropathy 03/10/2014  . Allergic rhinitis 03/10/2014  . Osteoporosis 03/10/2014  . Spinal stenosis of lumbar region with radiculopathy 02/28/2014  . Type 2 diabetes mellitus with complication, with long-term current use of insulin ([place]) 07/08/2013  . Hypokalemia 11/02/2012  . Essential hypertension, benign 11/02/2012  . Diabetes mellitus with neuropathy ([place]) 10/29/2012  . Hyperlipidemia 10/29/2012  . Spondylolisthesis of lumbar region 03/19/2011  . Lumbar radicular pain 03/19/2011    Scot Jun, PTA 03/29/2019, 2:00 PM  [place], [place], [address] Phone: [phone]   Fax:  [phone]  Name: [name] MRN: [id number] Date of Birth: 12-Mar-1941

## 2019-03-31 ENCOUNTER — Encounter: Payer: Self-pay | Admitting: Physical Therapy

## 2019-03-31 ENCOUNTER — Other Ambulatory Visit: Payer: Self-pay

## 2019-03-31 ENCOUNTER — Ambulatory Visit: Payer: Medicare Other | Admitting: Physical Therapy

## 2019-03-31 DIAGNOSIS — M545 Low back pain, unspecified: Secondary | ICD-10-CM

## 2019-03-31 DIAGNOSIS — M542 Cervicalgia: Secondary | ICD-10-CM | POA: Diagnosis not present

## 2019-03-31 DIAGNOSIS — R252 Cramp and spasm: Secondary | ICD-10-CM

## 2019-03-31 NOTE — Therapy (Signed)
[place], [place], [address] Phone: [phone]   Fax:  [phone]  Physical Therapy Treatment  Patient Details  Name: [name] MRN: [id number] Date of Birth: 08/15/1940 Referring Provider (PT): Elsner   Encounter Date: 03/31/2019  PT End of Session - 03/31/19 1348    Visit Number  23    PT Start Time  1300    PT Stop Time  [id number]    PT Time Calculation (min)  59 min    Activity Tolerance  Patient limited by pain    Behavior During Therapy  [facility] [place] for tasks assessed/performed       Past Medical History:  Diagnosis Date  . Acute blood loss anemia   . Acute encephalopathy 07/15/2016  . AKI (acute kidney injury) ([place])   . Anemia    has sickle cell trait  . Anxiety   . Arthritis   . Asthma    has used inhaler in past for asthmatic bronchitis, last time- early 2012  . Bilateral primary osteoarthritis of knee 03/26/2016  . Complication of anesthesia    wakes up shaking  . Diabetes mellitus   . Diabetes mellitus with neuropathy ([place]) 10/29/2012  . Diverticulitis   . Dyslipidemia   . Encephalopathy 06/2016   due to medications after surgery  . Fibromyalgia   . GERD (gastroesophageal reflux disease)    occas. use of  Prilosec  . Heart murmur    sees Dr. Montez Morita, last seen- early 2012  . Herniated nucleus pulposus, L2-3 06/25/2016  . History of back surgery   . Hypertension    02/2010- stress test /w PCP  . Hypothyroidism   . Loose bowel movements 12/2016  . Lumbar radiculopathy 06/27/2016  . Lumbar stenosis with neurogenic claudication 03/17/2017  . Memory disorder 02/16/2016  . Myoclonic jerking   . Neuromuscular disorder (HCC)    lumbar radiculopathy, lumbago  . Nocturnal leg cramps 09/27/2014  . Osteoporosis 03/10/2014  . Pneumonia   . Sickle cell trait ([place])   . Sleep apnea    borderline sleep apnea, states she no longer uses, early 2012- stopped using   . Spinal stenosis of  lumbar region with radiculopathy 02/28/2014  . Spondylolisthesis of lumbar region 03/19/2011  . Type II or unspecified type diabetes mellitus without mention of complication, uncontrolled 07/08/2013    Past Surgical History:  Procedure Laterality Date  . ABDOMINAL HYSTERECTOMY    . adb.cyst     ovarian cyst  . BACK SURGERY     2012, 2015 (3 total)  . BACK SURGERY  2018   02/24/2017  . COLONOSCOPY    . EYE SURGERY     macular degeneration treatment - injections  . FEMUR IM NAIL Left 06/13/2018   Procedure: INTRAMEDULLARY (IM) NAIL FEMORAL;  Surgeon: [name], MD;  [facility];  Service: Orthopedics;  Laterality: Left;  . OVARIAN CYST SURGERY    . POSTERIOR LUMBAR FUSION 4 LEVEL N/A 03/17/2017   Procedure: Decompression of Lumbar One-Two with Thoracic Ten to Lumbar Two Fusion;  Surgeon: Kristeen Miss, MD;  Location: [facility];  Service: Neurosurgery;  Laterality: N/A;  Decompression of L1-2 with T10 to L2 Fusion    There were no vitals filed for this visit.  Subjective Assessment - 03/31/19 1309    Subjective  "My back is still about a 8, I am just tired of this back hurting"    Pertinent History  has had  5 lumbar surgeries, osteoporosis, right knee DJD    Currently in Pain?  Yes    Pain Score  8     Pain Location  Back                       [facility] Adult PT Treatment/Exercise - 03/31/19 0001      Neck Exercises: Machines for Strengthening   UBE (Upper Arm Bike)  L3.5 x 2 min forward/backward    Cybex Row  20lb 2x10    Lat Pull  20lb x 10     Other Machines for Strengthening  back Ext black band 2x10      Neck Exercises: Standing   Other Standing Exercises  Shoulder ER red 2x10     Other Standing Exercises  OHP yellow ball bilat 2x10       Moist Heat Therapy   Number Minutes Moist Heat  15 Minutes    Moist Heat Location  Cervical      Electrical Stimulation   Electrical Stimulation Location  mid back    Electrical Stimulation Action  IFC     Electrical Stimulation Parameters  sitting    Electrical Stimulation Goals  Pain               PT Short Term Goals - 03/25/19 1352      PT SHORT TERM GOAL #1   Status  Achieved        PT Long Term Goals - 03/25/19 1352      PT LONG TERM GOAL #1   Title  decrease pain 50%    Status  On-going            Plan - 03/31/19 1349    Clinical Impression Statement  Cervical spine is a lot better. Pt reports increase theocratic pain today near the medial boarder of R scapula. Pain increases with palpation, and she reports that it goes down into her R leg. Rows and External rotation did not entice any increase in pain.She reports she could feel it with lat pull downs but it did not go down into her leg    Personal Factors and Comorbidities  Comorbidity 3+    Comorbidities  DJD right knee, 5 lumbar surgeries, osteoporosis    Stability/Clinical Decision Making  Evolving/Moderate complexity    Rehab Potential  Fair    PT Frequency  3x / week    PT Duration  8 weeks    PT Next Visit Plan  Postural strengthening and cervical ROM, advided pt to return to MD       Patient will benefit from skilled therapeutic intervention in order to improve the following deficits and impairments:  Pain, Postural dysfunction, Increased muscle spasms, Cardiopulmonary status limiting activity, Decreased activity tolerance, Decreased endurance, Decreased range of motion, Decreased strength, Decreased balance, Difficulty walking, Impaired flexibility, Improper body mechanics, Impaired UE functional use  Visit Diagnosis: Acute bilateral low back pain without sciatica  Cervicalgia  Cramp and spasm     Problem List Patient Active Problem List   Diagnosis Date Noted  . Fracture, proximal femur ([place]) 06/12/2018  . Femur fracture, left ([place]) 06/12/2018  . Acute blood loss as cause of postoperative anemia 03/30/2017  . Diabetic polyneuropathy associated with secondary diabetes mellitus ([place])  03/30/2017  . Asthma 03/30/2017  . Dyslipidemia 03/26/2017  . Lumbar stenosis with neurogenic claudication 03/17/2017  . Spinal stenosis of lumbar region 03/03/2017  . Myoclonic jerking   . Nausea   .  Acute encephalopathy 07/15/2016  . Lumbar radiculopathy 06/27/2016  . Hypothyroidism 06/26/2016  . Labile blood glucose   . Surgery, elective   . Post-operative pain   . Sickle cell trait ([place])   . Acute blood loss anemia   . History of back surgery   . AKI (acute kidney injury) ([place])   . Herniated nucleus pulposus, L2-3 06/25/2016  . Bilateral primary osteoarthritis of knee 03/26/2016  . Osteoarthritis, hand 03/26/2016  . Other insomnia 03/26/2016  . Fibromyalgia 09/27/2014  . Nocturnal leg cramps 09/27/2014  . Body mass index (BMI) of 30.0-30.9 in adult 08/04/2014  . Neuropathy 03/10/2014  . Allergic rhinitis 03/10/2014  . Osteoporosis 03/10/2014  . Spinal stenosis of lumbar region with radiculopathy 02/28/2014  . Type 2 diabetes mellitus with complication, with long-term current use of insulin ([place]) 07/08/2013  . Hypokalemia 11/02/2012  . Essential hypertension, benign 11/02/2012  . Diabetes mellitus with neuropathy ([place]) 10/29/2012  . Hyperlipidemia 10/29/2012  . Spondylolisthesis of lumbar region 03/19/2011  . Lumbar radicular pain 03/19/2011    Scot Jun, PTA 03/31/2019, 1:53 PM  [place], [place], [address] Phone: [phone]   Fax:  [phone]  Name: [name] MRN: [id number] Date of Birth: March 03, 1941

## 2019-04-01 ENCOUNTER — Ambulatory Visit: Payer: Medicare Other | Admitting: Physical Therapy

## 2019-04-14 ENCOUNTER — Ambulatory Visit: Payer: Medicare Other | Admitting: Adult Health

## 2019-04-26 ENCOUNTER — Telehealth: Payer: Self-pay

## 2019-04-26 NOTE — Telephone Encounter (Signed)
MEDICATION: insulin aspart (NOVOLOG FLEXPEN) 100 UNIT/ML FlexPen  TRESIBA FLEXTOUCH 100 UNIT/ML SOPN FlexTouch Pen  PHARMACY:  [id number] - [place], [place] - [address] AT [address]  IS THIS A 90 DAY SUPPLY :   IS PATIENT OUT OF MEDICATION:   IF NOT; HOW MUCH IS LEFT:   LAST APPOINTMENT DATE: @10 /23/2020  NEXT APPOINTMENT DATE:@2 /22/2021  DO WE HAVE YOUR PERMISSION TO LEAVE A DETAILED MESSAGE:  OTHER COMMENTS:    **Let patient know to contact pharmacy at the end of the day to make sure medication is ready. **  ** Please notify patient to allow 48-72 hours to process**  **Encourage patient to contact the pharmacy for refills or they can request refills through [facility]**

## 2019-04-27 ENCOUNTER — Other Ambulatory Visit: Payer: Self-pay

## 2019-04-27 ENCOUNTER — Other Ambulatory Visit: Payer: Self-pay | Admitting: Endocrinology

## 2019-04-27 MED ORDER — TRESIBA FLEXTOUCH 100 UNIT/ML ~~LOC~~ SOPN: PEN_INJECTOR | SUBCUTANEOUS | 0 refills | Status: DC

## 2019-04-27 MED ORDER — NOVOLOG FLEXPEN 100 UNIT/ML ~~LOC~~ SOPN: PEN_INJECTOR | SUBCUTANEOUS | 0 refills | Status: DC

## 2019-04-27 NOTE — Telephone Encounter (Signed)
Rx sent 

## 2019-04-30 ENCOUNTER — Other Ambulatory Visit: Payer: Self-pay

## 2019-04-30 MED ORDER — FARXIGA 5 MG PO TABS: 5.0000 mg | ORAL_TABLET | Freq: Every day | ORAL | 2 refills | Status: DC

## 2019-05-03 ENCOUNTER — Other Ambulatory Visit: Payer: Self-pay

## 2019-05-03 MED ORDER — TRESIBA FLEXTOUCH 100 UNIT/ML ~~LOC~~ SOPN: PEN_INJECTOR | SUBCUTANEOUS | 0 refills | Status: DC

## 2019-05-03 MED ORDER — NOVOLOG FLEXPEN 100 UNIT/ML ~~LOC~~ SOPN: PEN_INJECTOR | SUBCUTANEOUS | 0 refills | Status: DC

## 2019-05-03 MED ORDER — GLUCOSE BLOOD VI STRP: ORAL_STRIP | 1 refills | Status: DC

## 2019-05-03 MED ORDER — FARXIGA 5 MG PO TABS: ORAL_TABLET | ORAL | 2 refills | Status: DC

## 2019-05-03 MED ORDER — ACCU-CHEK AVIVA PLUS W/DEVICE KIT: PACK | 0 refills | Status: DC

## 2019-05-07 ENCOUNTER — Ambulatory Visit: Payer: Medicare Other | Admitting: Rheumatology

## 2019-05-19 ENCOUNTER — Ambulatory Visit: Payer: Medicare Other | Admitting: Orthopaedic Surgery

## 2019-05-21 ENCOUNTER — Other Ambulatory Visit: Payer: Self-pay | Admitting: Endocrinology

## 2019-05-24 ENCOUNTER — Ambulatory Visit (INDEPENDENT_AMBULATORY_CARE_PROVIDER_SITE_OTHER): Payer: Medicare PPO

## 2019-05-24 ENCOUNTER — Other Ambulatory Visit: Payer: Self-pay

## 2019-05-24 ENCOUNTER — Ambulatory Visit (INDEPENDENT_AMBULATORY_CARE_PROVIDER_SITE_OTHER): Payer: Medicare PPO | Admitting: Orthopaedic Surgery

## 2019-05-24 ENCOUNTER — Encounter: Payer: Self-pay | Admitting: Orthopaedic Surgery

## 2019-05-24 VITALS — Ht 65.0 in | Wt 178.0 lb

## 2019-05-24 DIAGNOSIS — M25561 Pain in right knee: Secondary | ICD-10-CM

## 2019-05-24 DIAGNOSIS — G8929 Other chronic pain: Secondary | ICD-10-CM | POA: Diagnosis not present

## 2019-05-24 DIAGNOSIS — M1711 Unilateral primary osteoarthritis, right knee: Secondary | ICD-10-CM

## 2019-05-24 NOTE — Progress Notes (Signed)
Office Visit Note   Patient: [name]           Date of Birth: [birth year]           MRN: [id number] Visit Date: 05/24/2019              Requested by: Wenda Low, MD [address] [place],  [place] [address] PCP: Wenda Low, MD   Assessment & Plan: Visit Diagnoses:  1. Chronic pain of right knee   2. Unilateral primary osteoarthritis, right knee     Plan: At this point given the failed conservative treatment for many years now with her right knee we are recommending a knee replacement.  Her x-rays show severe end-stage arthritis and her clinical exam also correlates with severe arthritis of the right knee.  We talked in detail about what knee replacement surgery involves.  We discussed the risk and benefits of surgery.  I showed her knee model and explained what her interoperative and postoperative course would involve.  All question concerns were answered and addressed.  We will work on getting this scheduled sometime in the near future hopefully.  Follow-Up Instructions: Return for 2 weeks post-op.   Orders:  Orders Placed This Encounter  Procedures  . XR Knee 1-2 Views Right   No orders of the defined types were placed in this encounter.     Procedures: No procedures performed   Clinical Data: No additional findings.   Subjective: Chief Complaint  Patient presents with  . Right Knee - Pain  The patient is well-known to me.  She has severe debilitating right knee pain is been going on for many years.  We actually first met her a few years ago when she sustained a fracture of her proximal femur on the left side.  She had a subtrochanteric fracture that required intramedullary nailing.  That is finally done well but she has been adamant with a cane.  Her left knee was hurting her quite a bit before then.  It is gotten to where her left knee pain wakes her up at night.  It hurts mainly on the medial aspect.  She does ambulate with a cane.  Is  been getting worse for several years now.  At this point her right knee pain is detrimentally affecting her mobility, her quality of life and her activities and living.  It can be 10 out of 10 at times.  She has had multiple injections over time as well.  She is a diabetic but reports a hemoglobin A1c of below 7.  She is a very active 79 years old  HPI  Review of Systems She currently denies any headache, chest pain, shortness of breath, fever, chills, nausea, vomiting  Objective: Vital Signs: Ht '5\' 5"'  (1.651 m)   Wt 178 lb (80.7 kg)   LMP  (LMP Unknown)   BMI 29.62 kg/m   Physical Exam She is alert and orient x3 and in no acute distress she is ambulating with a cane Ortho Exam Examination of her right knee shows varus malalignment of the knee that is correctable.  She has patellofemoral crepitation and significant medial joint line tenderness.  Her right knee feels ligamentously stable.  There is a mild effusion.  She has full range of motion of her right knee. Specialty Comments:  No specialty comments available.  Imaging: XR Knee 1-2 Views Right  Result Date: 05/24/2019 2 views of the right knee show severe end-stage arthritis.  There  is complete loss of medial joint space with varus malalignment.  There are para-articular osteophytes in all 3 compartments with significant patellofemoral narrowing as well.    PMFS History: Patient Active Problem List   Diagnosis Date Noted  . Unilateral primary osteoarthritis, right knee 05/24/2019  . Fracture, proximal femur ([place]) 06/12/2018  . Femur fracture, left ([place]) 06/12/2018  . Acute blood loss as cause of postoperative anemia 03/30/2017  . Diabetic polyneuropathy associated with secondary diabetes mellitus ([place]) 03/30/2017  . Asthma 03/30/2017  . Dyslipidemia 03/26/2017  . Lumbar stenosis with neurogenic claudication 03/17/2017  . Spinal stenosis of lumbar region 03/03/2017  . Myoclonic jerking   . Nausea   . Acute encephalopathy  07/15/2016  . Lumbar radiculopathy 06/27/2016  . Hypothyroidism 06/26/2016  . Labile blood glucose   . Surgery, elective   . Post-operative pain   . Sickle cell trait ([place])   . Acute blood loss anemia   . History of back surgery   . AKI (acute kidney injury) ([place])   . Herniated nucleus pulposus, L2-3 06/25/2016  . Bilateral primary osteoarthritis of knee 03/26/2016  . Osteoarthritis, hand 03/26/2016  . Other insomnia 03/26/2016  . Fibromyalgia 09/27/2014  . Nocturnal leg cramps 09/27/2014  . Body mass index (BMI) of 30.0-30.9 in adult 08/04/2014  . Neuropathy 03/10/2014  . Allergic rhinitis 03/10/2014  . Osteoporosis 03/10/2014  . Spinal stenosis of lumbar region with radiculopathy 02/28/2014  . Type 2 diabetes mellitus with complication, with long-term current use of insulin ([place]) 07/08/2013  . Hypokalemia 11/02/2012  . Essential hypertension, benign 11/02/2012  . Diabetes mellitus with neuropathy ([place]) 10/29/2012  . Hyperlipidemia 10/29/2012  . Spondylolisthesis of lumbar region 03/19/2011  . Lumbar radicular pain 03/19/2011   Past Medical History:  Diagnosis Date  . Acute blood loss anemia   . Acute encephalopathy 07/15/2016  . AKI (acute kidney injury) ([place])   . Anemia    has sickle cell trait  . Anxiety   . Arthritis   . Asthma    has used inhaler in past for asthmatic bronchitis, last time- early 2012  . Bilateral primary osteoarthritis of knee 03/26/2016  . Complication of anesthesia    wakes up shaking  . Diabetes mellitus   . Diabetes mellitus with neuropathy ([place]) 10/29/2012  . Diverticulitis   . Dyslipidemia   . Encephalopathy 06/2016   due to medications after surgery  . Fibromyalgia   . GERD (gastroesophageal reflux disease)    occas. use of  Prilosec  . Heart murmur    sees Dr. Montez Morita, last seen- early 2012  . Herniated nucleus pulposus, L2-3 06/25/2016  . History of back surgery   . Hypertension    02/2010- stress test /w PCP  . Hypothyroidism   .  Loose bowel movements 12/2016  . Lumbar radiculopathy 06/27/2016  . Lumbar stenosis with neurogenic claudication 03/17/2017  . Memory disorder 02/16/2016  . Myoclonic jerking   . Neuromuscular disorder (HCC)    lumbar radiculopathy, lumbago  . Nocturnal leg cramps 09/27/2014  . Osteoporosis 03/10/2014  . Pneumonia   . Sickle cell trait ([place])   . Sleep apnea    borderline sleep apnea, states she no longer uses, early 2012- stopped using   . Spinal stenosis of lumbar region with radiculopathy 02/28/2014  . Spondylolisthesis of lumbar region 03/19/2011  . Type II or unspecified type diabetes mellitus without mention of complication, uncontrolled 07/08/2013    Family History  Problem Relation Age of Onset  . Ovarian  cancer Mother   . Cancer - Prostate Father   . Breast cancer Paternal Aunt   . Multiple myeloma Paternal Aunt   . Anesthesia problems Neg Hx   . Hypotension Neg Hx   . Malignant hyperthermia Neg Hx   . Pseudochol deficiency Neg Hx     Past Surgical History:  Procedure Laterality Date  . ABDOMINAL HYSTERECTOMY    . adb.cyst     ovarian cyst  . BACK SURGERY     2012, 2015 (3 total)  . BACK SURGERY  2018   02/24/2017  . COLONOSCOPY    . EYE SURGERY     macular degeneration treatment - injections  . FEMUR IM NAIL Left 06/13/2018   Procedure: INTRAMEDULLARY (IM) NAIL FEMORAL;  Surgeon: [name], MD;  [facility];  Service: Orthopedics;  Laterality: Left;  . OVARIAN CYST SURGERY    . POSTERIOR LUMBAR FUSION 4 LEVEL N/A 03/17/2017   Procedure: Decompression of Lumbar One-Two with Thoracic Ten to Lumbar Two Fusion;  Surgeon: Kristeen Miss, MD;  Location: [facility];  Service: Neurosurgery;  Laterality: N/A;  Decompression of L1-2 with T10 to L2 Fusion   Social History   Occupational History  . Occupation: Retired  Tobacco Use  . Smoking status: Former Smoker    Packs/day: 0.10    Years: 30.00    Pack years: 3.00    Types: Cigarettes    Quit date: 2005     Years since quitting: 16.0  . Smokeless tobacco: Never Used  Substance and Sexual Activity  . Alcohol use: Yes    Comment: wine /w dinner on occas.  . Drug use: Never  . Sexual activity: Never

## 2019-05-31 ENCOUNTER — Ambulatory Visit: Payer: Medicare PPO | Attending: Internal Medicine

## 2019-05-31 DIAGNOSIS — Z23 Encounter for immunization: Secondary | ICD-10-CM | POA: Insufficient documentation

## 2019-05-31 NOTE — Progress Notes (Signed)
   Covid-19 Vaccination Clinic  Name:  [name]    MRN: [id number] DOB: 04-06-1941  05/31/2019  [name] was observed post Covid-19 immunization for 15 minutes without incidence. She was provided with Vaccine Information Sheet and instruction to access the V-Safe system.   [name] was instructed to call 911 with any severe reactions post vaccine: Marland Kitchen Difficulty breathing  . Swelling of your face and throat  . A fast heartbeat  . A bad rash all over your body  . Dizziness and weakness    Immunizations Administered    Name Date Dose VIS Date Route   Pfizer COVID-19 Vaccine 05/31/2019  6:06 PM 0.3 mL 04/02/2019 Intramuscular   Manufacturer: [place]   Lot: [id number]   [place]: [id number]

## 2019-06-14 ENCOUNTER — Other Ambulatory Visit: Payer: Self-pay

## 2019-06-14 ENCOUNTER — Other Ambulatory Visit (INDEPENDENT_AMBULATORY_CARE_PROVIDER_SITE_OTHER): Payer: Medicare PPO

## 2019-06-14 DIAGNOSIS — E1165 Type 2 diabetes mellitus with hyperglycemia: Secondary | ICD-10-CM | POA: Diagnosis not present

## 2019-06-14 DIAGNOSIS — Z794 Long term (current) use of insulin: Secondary | ICD-10-CM | POA: Diagnosis not present

## 2019-06-14 LAB — COMPREHENSIVE METABOLIC PANEL
ALT: 8 U/L (ref 0–35)
AST: 13 U/L (ref 0–37)
Albumin: 4.4 g/dL (ref 3.5–5.2)
Alkaline Phosphatase: 79 U/L (ref 39–117)
BUN: 17 mg/dL (ref 6–23)
CO2: 28 mEq/L (ref 19–32)
Calcium: 9.6 mg/dL (ref 8.4–10.5)
Chloride: 106 mEq/L (ref 96–112)
Creatinine, Ser: 1.02 mg/dL (ref 0.40–1.20)
GFR: 63.17 mL/min (ref >60.00)
Glucose, Bld: 50 mg/dL — ABNORMAL LOW (ref 70–99)
Potassium: 3.6 mEq/L (ref 3.5–5.1)
Sodium: 145 mEq/L (ref 135–145)
Total Bilirubin: 0.4 mg/dL (ref 0.2–1.2)
Total Protein: 7.2 g/dL (ref 6.0–8.3)

## 2019-06-14 LAB — HEMOGLOBIN A1C: Hgb A1c MFr Bld: 6.4 % (ref 4.6–6.5)

## 2019-06-17 ENCOUNTER — Encounter: Payer: Self-pay | Admitting: Endocrinology

## 2019-06-17 ENCOUNTER — Ambulatory Visit (INDEPENDENT_AMBULATORY_CARE_PROVIDER_SITE_OTHER): Payer: Medicare PPO | Admitting: Endocrinology

## 2019-06-17 ENCOUNTER — Other Ambulatory Visit: Payer: Self-pay

## 2019-06-17 VITALS — BP 150/70 | HR 68 | Ht 65.0 in | Wt 180.4 lb

## 2019-06-17 DIAGNOSIS — E1165 Type 2 diabetes mellitus with hyperglycemia: Secondary | ICD-10-CM

## 2019-06-17 DIAGNOSIS — E063 Autoimmune thyroiditis: Secondary | ICD-10-CM | POA: Diagnosis not present

## 2019-06-17 DIAGNOSIS — R252 Cramp and spasm: Secondary | ICD-10-CM | POA: Diagnosis not present

## 2019-06-17 DIAGNOSIS — Z794 Long term (current) use of insulin: Secondary | ICD-10-CM

## 2019-06-17 DIAGNOSIS — E782 Mixed hyperlipidemia: Secondary | ICD-10-CM

## 2019-06-17 LAB — GLUCOSE, POCT (MANUAL RESULT ENTRY): POC Glucose: 103 mg/dl — AB (ref 70–99)

## 2019-06-17 MED ORDER — LIVALO 1 MG PO TABS: ORAL_TABLET | ORAL | 2 refills | Status: DC

## 2019-06-17 NOTE — Patient Instructions (Addendum)
Tresiba 20 units   Take Potassium daily  Check blood sugars on waking up 3-4 days a week  Also check blood sugars about 2 hours after meals and do this after different meals by rotation  Recommended blood sugar levels on waking up are 90-130 and about 2 hours after meal is 130-180  Please bring your blood sugar monitor to each visit, thank you

## 2019-06-17 NOTE — Progress Notes (Signed)
Patient ID: [name], female   DOB: August 17, 1940, 79 y.o.   MRN: [id number]      Reason for Appointment: Endocrinology follow-up  History of Present Illness    PROBLEM 1: Type 2 diabetes mellitus, date of diagnosis: 1998.   Prior history: She had previously been treated with Byetta, Glumetza, Amaryl, Victoza and  Onglyza However because of inadequate control and intolerance to drugs she was finally given pre-meal insulin along with Amaryl, Glumetza eventually stopped because of side effects and also Lantus added  Recent history:  The insulin regimen is: Novolog 6 at breakfast and 9 lunch, 8-10 acs Tresiba 22 units at lunch  Oral agents: Farxiga 5 mg daily  Her A1c is now 6.4, has been mostly between about 6.2 -7.4  Current blood sugar patterns and problems with management:  She has not brought her meter for download today  She thinks she is going to be using an Accu-Chek meter now because of insurance preference although still using her One Touch ultra  Previously would be checking blood sugars mostly in the mornings and does not remember what her readings after meals are  She thinks that very occasionally she may get up during the night feeling hungry and blood sugar may be as low as 66  However fasting readings are not below 85 and usually up to 110  Occasionally blood sugars may be low normal at dinnertime also  Her blood sugar was low in the lab but she took her NovoLog and did not eat breakfast  She is still not able to increase her walking, still has leg pain  Although she thinks that she has been inconsistent with her diet over the last 3 months she has not gained any weight  She was told to take 4 units of NovoLog if she is eating ice cream and she thinks that this will keep her sugar from going up significantly  Side effects from diabetes medications: Victoza caused nausea.  Metformin  Monitors blood glucose: 1-2 times a day, readings as above  Glucometer: Accu-Chek Aviva    PRE-MEAL Fasting Lunch Dinner Bedtime Overall  Glucose range:  97-143   72-225   72-228  Mean/median:  110     125   POST-MEAL PC Breakfast PC Lunch PC Dinner  Glucose range:   116, 228  171-194  Mean/median:    180     Meals: 2-3 meals per day at 10 am. 2 pm and 6-7 pm but inconsistent schedule.  Breakfast may be [place] sausage and egg with toast, occasionally oatmeal or cereal   Physical activity: exercise: Minimal, previously doing water aerobics 3/7 days a week  Certified Diabetes Educator visit: Most recent: 7/13.  Dietician visit: Most recent: 3/13.   Wt Readings from Last 3 Encounters:  06/17/19 180 lb 6.4 oz (81.8 kg)  05/24/19 178 lb (80.7 kg)  02/11/19 179 lb (75.0 kg)   Complications: Neuropathy  LABS:  Lab Results  Component Value Date   HGBA1C 6.4 06/14/2019   HGBA1C 6.8 (H) 02/09/2019   HGBA1C 6.4 10/14/2018   Lab Results  Component Value Date   MICROALBUR 4.8 (H) 02/09/2019   LDLCALC 169 (H) 10/14/2018   CREATININE 1.02 06/14/2019   Lab Results  Component Value Date   FRUCTOSAMINE 283 06/09/2018   FRUCTOSAMINE 296 (H) 05/16/2017   FRUCTOSAMINE 322 (H) 10/10/2016    Multiple other issues are addressed in review of systems  Office Visit on 06/17/2019  Component Date Value  Ref Range Status  . POC Glucose 06/17/2019 103* 70 - 99 mg/dl Final  Lab on 06/14/2019  Component Date Value Ref Range Status  . Sodium 06/14/2019 145  135 - 145 mEq/L Final  . Potassium 06/14/2019 3.6  3.5 - 5.1 mEq/L Final  . Chloride 06/14/2019 106  96 - 112 mEq/L Final  . CO2 06/14/2019 28  19 - 32 mEq/L Final  . Glucose, Bld 06/14/2019 50* 70 - 99 mg/dL Final  . BUN 06/14/2019 17  6 - 23 mg/dL Final  . Creatinine, Ser 06/14/2019 1.02  0.40 - 1.20 mg/dL Final  . Total Bilirubin 06/14/2019 0.4  0.2 - 1.2 mg/dL Final  . Alkaline Phosphatase 06/14/2019 79  39 - 117 U/L Final  . AST 06/14/2019 13  0 - 37 U/L Final  . ALT 06/14/2019 8  0 -  35 U/L Final  . Total Protein 06/14/2019 7.2  6.0 - 8.3 g/dL Final  . Albumin 06/14/2019 4.4  3.5 - 5.2 g/dL Final  . GFR 06/14/2019 63.17  >60.00 mL/min Final  . Calcium 06/14/2019 9.6  8.4 - 10.5 mg/dL Final  . Hgb A1c MFr Bld 06/14/2019 6.4  4.6 - 6.5 % Final   Glycemic Control Guidelines for People with Diabetes:Non Diabetic:  <6%Goal of Therapy: <7%Additional Action Suggested:  >8%       Allergies as of 06/17/2019      Reactions   Cymbalta [duloxetine Hcl] Diarrhea, Other (See Comments)   Dizziness, headache, irritability   Baclofen    jerks    Betadine [povidone Iodine] Swelling, Other (See Comments)   SWELLING REACTION DESCRIPTION/SEVERITY UNSPECIFIED  Reaction to betadine eye drops   Adhesive [tape] Rash   Lipitor [atorvastatin] Rash      Medication List       Accurate as of June 17, 2019  1:59 PM. If you have any questions, ask your nurse or doctor.        Accu-Chek Aviva Plus w/Device Kit Use Accu Chek Aviva as instructed to check blood sugar 4 times daily.   acetaminophen 500 MG tablet Commonly known as: TYLENOL Take 1,000 mg by mouth every 8 (eight) hours as needed for mild pain or moderate pain.   amLODipine 10 MG tablet Commonly known as: NORVASC Take 1 tablet (10 mg total) by mouth daily.   cetirizine 10 MG tablet Commonly known as: ZYRTEC Take 1 tablet (10 mg total) by mouth daily as needed for allergies.   Farxiga 5 MG Tabs tablet Generic drug: dapagliflozin propanediol Take 1 tablet by mouth once daily. DX:E11.65   fluticasone 50 MCG/ACT nasal spray Commonly known as: FLONASE Place 1 spray into both nostrils daily as needed for allergies.   gabapentin 600 MG tablet Commonly known as: NEURONTIN TAKE 1 TABLET(600 MG) BY MOUTH THREE TIMES DAILY   glucose blood test strip Use Accu Chek Aviva test strips as instructed to check blood sugar 4 times daily.   HYDROcodone-acetaminophen 5-325 MG tablet Commonly known as: NORCO/VICODIN Take 1  tablet by mouth every 6 (six) hours as needed for moderate pain.   ibuprofen 800 MG tablet Commonly known as: ADVIL Take 1 tablet (800 mg total) by mouth every 8 (eight) hours as needed.   Insulin Pen Needle 32G X 4 MM Misc Commonly known as: BD Pen Needle Nano U/F USE AS DIRECTED   irbesartan 300 MG tablet Commonly known as: AVAPRO TK 1 T PO QD   levothyroxine 50 MCG tablet Commonly known as: SYNTHROID TAKE 1 TABLET(50  MCG) BY MOUTH DAILY   MAGNESIUM PO Take 250 mg by mouth 2 (two) times daily.   meloxicam 15 MG tablet Commonly known as: MOBIC TK 1 T PO QD PRN   NovoLOG FlexPen 100 UNIT/ML FlexPen Generic drug: insulin aspart Inject 6 units under the skin before breakfast, 9 units at lunch, and 8-10 units at dinner. 3-4 PRN for large carbs or ice cream. DX:E11.65   Symbicort 80-4.5 MCG/ACT inhaler Generic drug: budesonide-formoterol Inhale 2 puffs into the lungs 2 (two) times daily as needed (shortness of breath).   Tyler Aas FlexTouch 100 UNIT/ML Sopn FlexTouch Pen Generic drug: insulin degludec INJECT 22 UNITS UNDER THE SKIN EVERY DAY. DX:E11.65       Allergies:  Allergies  Allergen Reactions  . Cymbalta [Duloxetine Hcl] Diarrhea and Other (See Comments)    Dizziness, headache, irritability  . Baclofen     jerks   . Betadine [Povidone Iodine] Swelling and Other (See Comments)    SWELLING REACTION DESCRIPTION/SEVERITY UNSPECIFIED  Reaction to betadine eye drops  . Adhesive [Tape] Rash  . Lipitor [Atorvastatin] Rash    Past Medical History:  Diagnosis Date  . Acute blood loss anemia   . Acute encephalopathy 07/15/2016  . AKI (acute kidney injury) ([place])   . Anemia    has sickle cell trait  . Anxiety   . Arthritis   . Asthma    has used inhaler in past for asthmatic bronchitis, last time- early 2012  . Bilateral primary osteoarthritis of knee 03/26/2016  . Complication of anesthesia    wakes up shaking  . Diabetes mellitus   . Diabetes mellitus with  neuropathy ([place]) 10/29/2012  . Diverticulitis   . Dyslipidemia   . Encephalopathy 06/2016   due to medications after surgery  . Fibromyalgia   . GERD (gastroesophageal reflux disease)    occas. use of  Prilosec  . Heart murmur    sees Dr. Montez Morita, last seen- early 2012  . Herniated nucleus pulposus, L2-3 06/25/2016  . History of back surgery   . Hypertension    02/2010- stress test /w PCP  . Hypothyroidism   . Loose bowel movements 12/2016  . Lumbar radiculopathy 06/27/2016  . Lumbar stenosis with neurogenic claudication 03/17/2017  . Memory disorder 02/16/2016  . Myoclonic jerking   . Neuromuscular disorder ([facility])    lumbar radiculopathy, lumbago  . Nocturnal leg cramps 09/27/2014  . Osteoporosis 03/10/2014  . Pneumonia   . Sickle cell trait ([place])   . Sleep apnea    borderline sleep apnea, states she no longer uses, early 2012- stopped using   . Spinal stenosis of lumbar region with radiculopathy 02/28/2014  . Spondylolisthesis of lumbar region 03/19/2011  . Type II or unspecified type diabetes mellitus without mention of complication, uncontrolled 07/08/2013    Past Surgical History:  Procedure Laterality Date  . ABDOMINAL HYSTERECTOMY    . adb.cyst     ovarian cyst  . BACK SURGERY     2012, 2015 (3 total)  . BACK SURGERY  2018   02/24/2017  . COLONOSCOPY    . EYE SURGERY     macular degeneration treatment - injections  . FEMUR IM NAIL Left 06/13/2018   Procedure: INTRAMEDULLARY (IM) NAIL FEMORAL;  Surgeon: [name], MD;  Location: [facility];  Service: Orthopedics;  Laterality: Left;  . OVARIAN CYST SURGERY    . POSTERIOR LUMBAR FUSION 4 LEVEL N/A 03/17/2017   Procedure: Decompression of Lumbar One-Two with Thoracic Ten to Lumbar Two  Fusion;  Surgeon: Kristeen Miss, MD;  Location: [facility];  Service: Neurosurgery;  Laterality: N/A;  Decompression of L1-2 with T10 to L2 Fusion    Family History  Problem Relation Age of Onset  . Ovarian cancer Mother   . Cancer -  Prostate Father   . Breast cancer Paternal Aunt   . Multiple myeloma Paternal Aunt   . Anesthesia problems Neg Hx   . Hypotension Neg Hx   . Malignant hyperthermia Neg Hx   . Pseudochol deficiency Neg Hx     Social History:  reports that she quit smoking about 16 years ago. Her smoking use included cigarettes. She has a 3.00 pack-year smoking history. She has never used smokeless tobacco. She reports current alcohol use. She reports that she does not use drugs.  ROS    NEUROPATHY: She has had tingling in feet and legs especially at night. She is taking gabapentin without consistent relief at night Also she will take Lyrica with somewhat better relief at night This was also prescribed as needed for fibromyalgia by her rheumatologist  Hyperlipidemia:   The lipid abnormality consists of elevated LDL mostly Did not tolerate Crestor or lovastatin because of muscle cramps   She is off Zetia, previously did think she was having muscle cramps  Her lipids are poorly controlled without any treatment  LDL 166 in October 2020 Has not checked on the coverage for Livalo  Lab Results  Component Value Date   CHOL 250 (H) 10/14/2018   HDL 51.40 10/14/2018   LDLCALC 169 (H) 10/14/2018   LDLDIRECT 166.0 02/09/2019   TRIG 147.0 10/14/2018   CHOLHDL 5 10/14/2018      HYPERTENSION:  Has been present for several years.  Apparently she is on Avapro HCTZ from PCP along with amlodipine   BP Readings from Last 3 Encounters:  06/17/19 (!) 150/70  02/11/19 140/66  01/18/19 119/74   . Has a history of leg and muscle cramps and takes magnesium supplements twice a day However has had some more cramps lately Previously had potassium supplements and has taken 1 tablet today Potassium as follows:  Lab Results  Component Value Date   K 3.6 06/14/2019        Hypothyroidism  She had a relatively high TSH as of 4/17 and was empirically given Synthroid 25 g Subsequently has been on 50 mcg   She says she has been feeling tired for quite some time and not more recently  TSH history as follows   Lab Results  Component Value Date   TSH 3.74 02/09/2019   TSH 4.85 (H) 10/14/2018   TSH 3.54 06/09/2018   FREET4 0.88 02/09/2019   FREET4 0.97 10/14/2018   FREET4 0.78 02/22/2016         Examination:   BP (!) 150/70 (BP Location: Left Arm, Patient Position: Sitting, Cuff Size: Normal)   Pulse 68   Ht _0  (1.651 m)   Wt 180 lb 6.4 oz (81.8 kg)   LMP  (LMP Unknown)   SpO2 95%   BMI 30.02 kg/m   Body mass index is 30.02 kg/m.     Assesment/Plan:   1. DIABETES type 2   See history of present illness for detailed discussion of management, blood sugar patterns and problems identified  She is on basal bolus insulin and Farxiga  A1c is better at 6.4   Even without significant exercise and inconsistent compliance with her diet regimen her sugar control is still fairly good and  she has not gained weight She did not bring her meter and not clear if she is checking enough readings after meals Her basal insulin appears to be slightly excessive since she reports occasional low sugars overnight and usually fasting blood sugars appear to be fairly normal She has adjusted her mealtime dose and snack coverage with NovoLog She is again complaining about difficulty with losing weight even with starting [place] last year   Recommendations: Now take 20 units [place] every day and if her morning sugars are still in the 80s and 90s or she has overnight low sugars she can reduce it down to 18 More blood sugars after meals to help adjust her NovoLog coverage Continue to have NovoLog additionally for desserts or snacks such as ice cream Increase exercise as tolerated Bring monitor for download on next visit She does not have to check 4 times a day and can likely do twice a day readings at various times  3. Hypertension: Blood pressure is relatively higher and she needs to  follow-up with her PCP who is managing this Apparently is on HCTZ but updated medication list not available  3B.  Low normal potassium and some cramps in muscles: She will take her potassium daily, need to continue this as long as she is on HCTZ   4.   Hypothyroidism: She is taking 50 mcg levothyroxine but needs follow-up TSH in the next visit  She does have long-term fatigue and she will discuss with PCP  LIPIDS: She will see if Livalo is covered and we will send a prescription to the pharmacy  Liver functions are normal, history of fatty liver on ultrasound  Follow-up in 3 months     Patient Instructions  Tresiba 20 units   Take Potassium daily  Check blood sugars on waking up 3-4 days a week  Also check blood sugars about 2 hours after meals and do this after different meals by rotation  Recommended blood sugar levels on waking up are 90-130 and about 2 hours after meal is 130-180  Please bring your blood sugar monitor to each visit, thank you          [name] 06/17/2019, 1:59 PM

## 2019-06-26 ENCOUNTER — Ambulatory Visit: Payer: Medicare PPO | Attending: Internal Medicine

## 2019-06-26 DIAGNOSIS — Z23 Encounter for immunization: Secondary | ICD-10-CM

## 2019-06-26 NOTE — Progress Notes (Signed)
   Covid-19 Vaccination [facility]  Name:  [name]    MRN: [id number] DOB: Apr 08, 1941  06/26/2019  [name] was observed post Covid-19 immunization for 15 minutes without incident. She was provided with Vaccine Information Sheet and instruction to access the V-Safe system.   [name] was instructed to call 911 with any severe reactions post vaccine: Marland Kitchen Difficulty breathing  . Swelling of face and throat  . A fast heartbeat  . A bad rash all over body  . Dizziness and weakness   Immunizations Administered    Name Date Dose VIS Date Route   Pfizer COVID-19 Vaccine 06/26/2019 12:38 PM 0.3 mL 04/02/2019 Intramuscular   Manufacturer: [place]   Lot: [id number]   [place]: [id number]

## 2019-07-05 ENCOUNTER — Other Ambulatory Visit: Payer: Self-pay

## 2019-07-05 ENCOUNTER — Encounter: Payer: Self-pay | Admitting: Adult Health

## 2019-07-05 MED ORDER — BD PEN NEEDLE NANO U/F 32G X 4 MM MISC: 2 refills | Status: DC

## 2019-07-19 ENCOUNTER — Encounter: Payer: Self-pay | Admitting: Orthopaedic Surgery

## 2019-07-22 ENCOUNTER — Other Ambulatory Visit: Payer: Self-pay

## 2019-07-27 ENCOUNTER — Encounter: Payer: Self-pay | Admitting: Orthopaedic Surgery

## 2019-08-05 ENCOUNTER — Other Ambulatory Visit: Payer: Self-pay | Admitting: Physician Assistant

## 2019-08-09 NOTE — Patient Instructions (Addendum)
DUE TO COVID-19 ONLY ONE VISITOR IS ALLOWED TO COME WITH YOU AND STAY IN THE WAITING ROOM ONLY DURING PRE OP AND PROCEDURE DAY OF SURGERY. THE 2 VISITORS MAY VISIT WITH YOU AFTER SURGERY IN YOUR PRIVATE ROOM DURING VISITING HOURS ONLY!    ONCE YOUR COVID TEST IS COMPLETED, PLEASE BEGIN THE QUARANTINE INSTRUCTIONS AS OUTLINED IN YOUR HANDOUT.  Stop taking Mobic, Advil, Ibuprofen, Aleve and Aspirin one week before surgery (4/16)                [name]    Your procedure is scheduled on: 08/13/19   Report to [facility] Main  Entrance   Report to admitting at  8:30 AM     Call this number if you have problems the morning of surgery [phone]   . BRUSH YOUR TEETH MORNING OF SURGERY AND RINSE YOUR MOUTH OUT, NO CHEWING GUM CANDY OR MINTS.   Do not eat solid food After Midnight.   YOU MAY HAVE CLEAR LIQUIDS FROM MIDNIGHT UNTIL 8:00 AM  CLEAR LIQUID DIET  Foods Allowed                                                                     Foods Excluded  Water, Black Coffee and tea, regular and decaf                             liquids that you cannot  Plain Jell-O in any flavor  (No red)                                           see through such as: Fruit ices (not with fruit pulp)                                                     milk, soups, orange juice  Iced Popsicles (No red)                                                     All solid food Carbonated beverages, regular and diet                                    Apple juices Sports drinks like Gatorade (No red) Lightly seasoned clear broth or consume(fat free) Sugar, honey syrup    . At 8:15 AM Please drink  the prescribed Pre-Surgery Gatorade drink.   Nothing by mouth after you finish the Gatorade drink !    Take these medicines the morning of surgery with A SIP OF WATER: Gabapentin, Zyrtec, Bistolic, Flonase, Levothyroxine,  Use your inhalers and bring them with you to the hospital.  How to Manage Your Diabetes Before and After Surgery  Why is it important to control my blood sugar before and after surgery? . Improving blood sugar levels before and after surgery helps healing and can limit problems. . A way of improving blood sugar control is eating a healthy diet by: o  Eating less sugar and carbohydrates o  Increasing activity/exercise o  Talking with your doctor about reaching your blood sugar goals . High blood sugars (greater than 180 mg/dL) can raise your risk of infections and slow your recovery, so you will need to focus on controlling your diabetes during the weeks before surgery. . Make sure that the doctor who takes care of your diabetes knows about your planned surgery including the date and location.  How do I manage my blood sugar before surgery? . Check your blood sugar at least 4 times a day, starting 2 days before surgery, to make sure that the level is not too high or low. o Check your blood sugar the morning of your surgery when you wake up and every 2 hours until you get to the Short Stay unit. . If your blood sugar is less than 70 mg/dL, you will need to treat for low blood sugar: o Do not take insulin. o Treat a low blood sugar (less than 70 mg/dL) with  cup of clear juice (cranberry or apple), 4 glucose tablets, OR glucose gel. o Recheck blood sugar in 15 minutes after treatment (to make sure it is greater than 70 mg/dL). If your blood sugar is not greater than 70 mg/dL on recheck, call [phone] for further instructions. . Report your blood sugar to the short stay nurse when you get to Short Stay.  . If you are admitted to the hospital after surgery: o Your blood sugar will be checked by the staff and you will probably be given insulin after surgery (instead of oral diabetes medicines) to make sure you have good blood sugar levels. o The goal for blood sugar control after surgery is 80-180 mg/dL.   WHAT DO I DO ABOUT MY DIABETES  MEDICATION?  .   Don't take Wilder Glade the day before surgery(Thursday 4/22)  . THE DAY BEFORE SURGERY, take 20  units of   Tresiba  Insulin in the morning.      . THE MORNING OF SURGERY, take 10 units of   Tresiba  Insulin. .  Do not take any oral diabetic medication the morning of surgery  . If your CBG is greater than 220 mg/dL, you may take  of your sliding scale  . (correction) dose of insulin.                     You may not have any metal on your body including hair pins and              piercings  Do not wear jewelry, make-up, lotions, powders or perfumes, deodorant             Do not wear nail polish on your fingernails.  Do not shave  48 hours prior to surgery.     Do not bring valuables to the hospital. [place].  Contacts, dentures or bridgework may not be worn into surgery.                    Please read  over the following fact sheets you were given: _____________________________________________________________________             [facility] - Preparing for Surgery  Before surgery, you can play an important role.   Because skin is not sterile, your skin needs to be as free of germs as possible.   You can reduce the number of germs on your skin by washing with CHG (chlorahexidine gluconate) soap before surgery.   CHG is an antiseptic cleaner which kills germs and bonds with the skin to continue killing germs even after washing. Please DO NOT use if you have an allergy to CHG or antibacterial soaps .  If your skin becomes reddened/irritated stop using the CHG and inform your nurse when you arrive at Short Stay. Do not shave (including legs and underarms) for at least 48 hours prior to the first CHG shower.    Please follow these instructions carefully:  1.  Shower with CHG Soap the night before surgery and the  morning of Surgery.  2.  If you choose to wash your hair, wash your hair first as usual with your   normal  shampoo.  3.  After you shampoo, rinse your hair and body thoroughly to remove the  shampoo.                                        4.  Use CHG as you would any other liquid soap.  You can apply chg directly  to the skin and wash                       Gently with a scrungie or clean washcloth.  5.  Apply the CHG Soap to your body ONLY FROM THE NECK DOWN.   Do not use on face/ open                           Wound or open sores. Avoid contact with eyes, ears mouth and genitals (private parts).                       Wash face,  Genitals (private parts) with your normal soap.             6.  Wash thoroughly, paying special attention to the area where your surgery  will be performed.  7.  Thoroughly rinse your body with warm water from the neck down.  8.  DO NOT shower/wash with your normal soap after using and rinsing off  the CHG Soap.             9.  Pat yourself dry with a clean towel.            10.  Wear clean pajamas.            11.  Place clean sheets on your bed the night of your first shower and do not  sleep with pets.  Day of Surgery : Do not apply any lotions/deodorants the morning of surgery.  Please wear clean clothes to the hospital/surgery center.    FAILURE TO FOLLOW THESE INSTRUCTIONS MAY RESULT IN THE CANCELLATION OF YOUR SURGERY PATIENT SIGNATURE_________________________________  NURSE SIGNATURE__________________________________  ________________________________________________________________________   Adam Phenix  An incentive spirometer is a tool that can help keep your lungs clear and active. This tool  measures how well you are filling your lungs with each breath. Taking long deep breaths may help reverse or decrease the chance of developing breathing (pulmonary) problems (especially infection) following:  A long period of time when you are unable to move or be active. BEFORE THE PROCEDURE   If the spirometer includes an indicator to show your  best effort, your nurse or respiratory therapist will set it to a desired goal.  If possible, sit up straight or lean slightly forward. Try not to slouch.  Hold the incentive spirometer in an upright position. INSTRUCTIONS FOR USE  1. Sit on the edge of your bed if possible, or sit up as far as you can in bed or on a chair. 2. Hold the incentive spirometer in an upright position. 3. Breathe out normally. 4. Place the mouthpiece in your mouth and seal your lips tightly around it. 5. Breathe in slowly and as deeply as possible, raising the piston or the ball toward the top of the column. 6. Hold your breath for 3-5 seconds or for as long as possible. Allow the piston or ball to fall to the bottom of the column. 7. Remove the mouthpiece from your mouth and breathe out normally. 8. Rest for a few seconds and repeat Steps 1 through 7 at least 10 times every 1-2 hours when you are awake. Take your time and take a few normal breaths between deep breaths. 9. The spirometer may include an indicator to show your best effort. Use the indicator as a goal to work toward during each repetition. 10. After each set of 10 deep breaths, practice coughing to be sure your lungs are clear. If you have an incision (the cut made at the time of surgery), support your incision when coughing by placing a pillow or rolled up towels firmly against it. Once you are able to get out of bed, walk around indoors and cough well. You may stop using the incentive spirometer when instructed by your caregiver.  RISKS AND COMPLICATIONS  Take your time so you do not get dizzy or light-headed.  If you are in pain, you may need to take or ask for pain medication before doing incentive spirometry. It is harder to take a deep breath if you are having pain. AFTER USE  Rest and breathe slowly and easily.  It can be helpful to keep track of a log of your progress. Your caregiver can provide you with a simple table to help with this. If  you are using the spirometer at home, follow these instructions: [place] IF:   You are having difficultly using the spirometer.  You have trouble using the spirometer as often as instructed.  Your pain medication is not giving enough relief while using the spirometer.  You develop fever of 100.5 F (38.1 C) or higher. SEEK IMMEDIATE MEDICAL CARE IF:   You cough up bloody sputum that had not been present before.  You develop fever of 102 F (38.9 C) or greater.  You develop worsening pain at or near the incision site. MAKE SURE YOU:   Understand these instructions.  Will watch your condition.  Will get help right away if you are not doing well or get worse. Document Released: 08/19/2006 Document Revised: 07/01/2011 Document Reviewed: 10/20/2006 [facility] Patient Information 2014 [place], [place].   ________________________________________________________________________

## 2019-08-10 ENCOUNTER — Encounter (HOSPITAL_COMMUNITY): Payer: Self-pay

## 2019-08-10 ENCOUNTER — Other Ambulatory Visit (HOSPITAL_COMMUNITY)
Admission: RE | Admit: 2019-08-10 | Discharge: 2019-08-10 | Disposition: A | Discharge status: Home / Self Care | Payer: Medicare PPO | Source: Ambulatory Visit | Attending: Orthopaedic Surgery | Admitting: Orthopaedic Surgery

## 2019-08-10 ENCOUNTER — Other Ambulatory Visit: Payer: Self-pay

## 2019-08-10 ENCOUNTER — Encounter (HOSPITAL_COMMUNITY)
Admission: RE | Admit: 2019-08-10 | Discharge: 2019-08-10 | Disposition: A | Discharge status: Home / Self Care | Payer: Medicare PPO | Source: Ambulatory Visit | Attending: Orthopaedic Surgery | Admitting: Orthopaedic Surgery

## 2019-08-10 DIAGNOSIS — Z794 Long term (current) use of insulin: Secondary | ICD-10-CM | POA: Diagnosis not present

## 2019-08-10 DIAGNOSIS — Z79890 Hormone replacement therapy (postmenopausal): Secondary | ICD-10-CM | POA: Insufficient documentation

## 2019-08-10 DIAGNOSIS — Z01818 Encounter for other preprocedural examination: Secondary | ICD-10-CM | POA: Insufficient documentation

## 2019-08-10 DIAGNOSIS — E1140 Type 2 diabetes mellitus with diabetic neuropathy, unspecified: Secondary | ICD-10-CM | POA: Insufficient documentation

## 2019-08-10 DIAGNOSIS — I10 Essential (primary) hypertension: Secondary | ICD-10-CM | POA: Insufficient documentation

## 2019-08-10 DIAGNOSIS — E785 Hyperlipidemia, unspecified: Secondary | ICD-10-CM | POA: Insufficient documentation

## 2019-08-10 DIAGNOSIS — D573 Sickle-cell trait: Secondary | ICD-10-CM | POA: Insufficient documentation

## 2019-08-10 DIAGNOSIS — M1711 Unilateral primary osteoarthritis, right knee: Secondary | ICD-10-CM | POA: Insufficient documentation

## 2019-08-10 DIAGNOSIS — Z87891 Personal history of nicotine dependence: Secondary | ICD-10-CM | POA: Diagnosis not present

## 2019-08-10 DIAGNOSIS — Z79899 Other long term (current) drug therapy: Secondary | ICD-10-CM | POA: Diagnosis not present

## 2019-08-10 DIAGNOSIS — Z20822 Contact with and (suspected) exposure to covid-19: Secondary | ICD-10-CM | POA: Diagnosis not present

## 2019-08-10 DIAGNOSIS — E039 Hypothyroidism, unspecified: Secondary | ICD-10-CM | POA: Diagnosis not present

## 2019-08-10 LAB — SURGICAL PCR SCREEN
MRSA, PCR: NEGATIVE
Staphylococcus aureus: POSITIVE — AB

## 2019-08-10 LAB — CBC
HCT: 42.6 % (ref 36.0–46.0)
Hemoglobin: 13.6 g/dL (ref 12.0–15.0)
MCH: 27.5 pg (ref 26.0–34.0)
MCHC: 31.9 g/dL (ref 30.0–36.0)
MCV: 86.1 fL (ref 80.0–100.0)
Platelets: 210 10*3/uL (ref 150–400)
RBC: 4.95 MIL/uL (ref 3.87–5.11)
RDW: 13.7 % (ref 11.5–15.5)
WBC: 6.6 10*3/uL (ref 4.0–10.5)
nRBC: 0 % (ref 0.0–0.2)

## 2019-08-10 LAB — BASIC METABOLIC PANEL
Anion gap: 9 (ref 5–15)
BUN: 21 mg/dL (ref 8–23)
CO2: 27 mmol/L (ref 22–32)
Calcium: 9.1 mg/dL (ref 8.9–10.3)
Chloride: 106 mmol/L (ref 98–111)
Creatinine, Ser: 0.98 mg/dL (ref 0.44–1.00)
GFR calc Af Amer: >60 mL/min (ref >60)
GFR calc non Af Amer: 55 mL/min — ABNORMAL LOW (ref >60)
Glucose, Bld: 139 mg/dL — ABNORMAL HIGH (ref 70–99)
Potassium: 4.1 mmol/L (ref 3.5–5.1)
Sodium: 142 mmol/L (ref 135–145)

## 2019-08-10 LAB — SARS CORONAVIRUS 2 (TAT 6-24 HRS): SARS Coronavirus 2: NEGATIVE

## 2019-08-10 LAB — GLUCOSE, CAPILLARY: Glucose-Capillary: 140 mg/dL — ABNORMAL HIGH (ref 70–99)

## 2019-08-10 LAB — HEMOGLOBIN A1C
Hgb A1c MFr Bld: 6.5 % — ABNORMAL HIGH (ref 4.8–5.6)
Mean Plasma Glucose: 139.85 mg/dL

## 2019-08-10 NOTE — Progress Notes (Signed)
PCP - Dr. Tommie Ard Cardiologist - no  Chest x-ray - no EKG - 08/10/19 Stress Test - no ECHO - 2016 Cardiac Cath - no  Sleep Study - yes CPAP - no  Fasting Blood Sugar -  Checks Blood Sugar _____ times a day  Blood Thinner Instructions:ASA 81 Aspirin Instructions:Dr. Lysle Rubens said to stop it 5 days before surgery Last Dose:08/09/19  Anesthesia review:   Patient denies shortness of breath, fever, cough and chest pain at PAT appointment yes  Patient verbalized understanding of instructions that were given to them at the PAT appointment. Patient was also instructed that they will need to review over the PAT instructions again at home before surgery. yes

## 2019-08-12 ENCOUNTER — Telehealth: Payer: Self-pay | Admitting: *Deleted

## 2019-08-12 ENCOUNTER — Telehealth: Payer: Self-pay | Admitting: Endocrinology

## 2019-08-12 NOTE — Telephone Encounter (Signed)
Patient called re: Patient is having knee replacement surgery on 08/13/19 at 10:45 a.m. Patient was instructed to take 10 units of Tresiba tomorrow morning and no other medications unless patient's blood sugars reach over 220. If over 220, patient was instructed that she may take 1/2 dose of the sliding insulin (Novolog?). If questions please contact patient at ph# [phone].

## 2019-08-12 NOTE — Telephone Encounter (Signed)
She can take 10 units of Tresiba in the morning.  Unlikely that her sugar will be over 200 with her fasting and she should not take any NovoLog anyway

## 2019-08-12 NOTE — H&P (Signed)
TOTAL KNEE ADMISSION H&P  Patient is being admitted for right total knee arthroplasty.  Subjective:  Chief Complaint:right knee pain.  HPI: [name], 79 y.o. female, has a history of pain and functional disability in the right knee due to arthritis and has failed non-surgical conservative treatments for greater than 12 weeks to includeNSAID's and/or analgesics, corticosteriod injections, viscosupplementation injections, flexibility and strengthening excercises, supervised PT with diminished ADL's post treatment, use of assistive devices and activity modification.  Onset of symptoms was gradual, starting 3 years ago with gradually worsening course since that time. The patient noted no past surgery on the right knee(s).  Patient currently rates pain in the right knee(s) at 10 out of 10 with activity. Patient has night pain, worsening of pain with activity and weight bearing, pain that interferes with activities of daily living, pain with passive range of motion, crepitus and joint swelling.  Patient has evidence of subchondral sclerosis, periarticular osteophytes and joint space narrowing by imaging studies. There is no active infection.  Patient Active Problem List   Diagnosis Date Noted  . Unilateral primary osteoarthritis, right knee 05/24/2019  . Fracture, proximal femur ([place]) 06/12/2018  . Femur fracture, left ([place]) 06/12/2018  . Acute blood loss as cause of postoperative anemia 03/30/2017  . Diabetic polyneuropathy associated with secondary diabetes mellitus ([place]) 03/30/2017  . Asthma 03/30/2017  . Dyslipidemia 03/26/2017  . Lumbar stenosis with neurogenic claudication 03/17/2017  . Spinal stenosis of lumbar region 03/03/2017  . Myoclonic jerking   . Nausea   . Acute encephalopathy 07/15/2016  . Lumbar radiculopathy 06/27/2016  . Hypothyroidism 06/26/2016  . Labile blood glucose   . Surgery, elective   . Post-operative pain   . Sickle cell trait ([place])   . Acute blood loss  anemia   . History of back surgery   . AKI (acute kidney injury) ([place])   . Herniated nucleus pulposus, L2-3 06/25/2016  . Bilateral primary osteoarthritis of knee 03/26/2016  . Osteoarthritis, hand 03/26/2016  . Other insomnia 03/26/2016  . Fibromyalgia 09/27/2014  . Nocturnal leg cramps 09/27/2014  . Body mass index (BMI) of 30.0-30.9 in adult 08/04/2014  . Neuropathy 03/10/2014  . Allergic rhinitis 03/10/2014  . Osteoporosis 03/10/2014  . Spinal stenosis of lumbar region with radiculopathy 02/28/2014  . Type 2 diabetes mellitus with complication, with long-term current use of insulin ([place]) 07/08/2013  . Hypokalemia 11/02/2012  . Essential hypertension, benign 11/02/2012  . Diabetes mellitus with neuropathy ([place]) 10/29/2012  . Hyperlipidemia 10/29/2012  . Spondylolisthesis of lumbar region 03/19/2011  . Lumbar radicular pain 03/19/2011   Past Medical History:  Diagnosis Date  . Acute blood loss anemia   . Acute encephalopathy 07/15/2016  . AKI (acute kidney injury) ([place])   . Anemia    has sickle cell trait  . Anxiety   . Arthritis   . Asthma    has used inhaler in past for asthmatic bronchitis, last time- early 2012  . Bilateral primary osteoarthritis of knee 03/26/2016  . Complication of anesthesia    wakes up shaking  . Diabetes mellitus   . Diabetes mellitus with neuropathy ([place]) 10/29/2012  . Diverticulitis   . Dyslipidemia   . Encephalopathy 06/2016   due to medications after surgery  . Fibromyalgia   . GERD (gastroesophageal reflux disease)    occas. use of  Prilosec  . Heart murmur    sees Dr. Montez Morita, last seen- early 2012  . Herniated nucleus pulposus, L2-3 06/25/2016  . History of  back surgery   . Hypertension    02/2010- stress test /w PCP  . Hypothyroidism   . Loose bowel movements 12/2016  . Lumbar radiculopathy 06/27/2016  . Lumbar stenosis with neurogenic claudication 03/17/2017  . Memory disorder 02/16/2016  . Myoclonic jerking   . Neuromuscular  disorder ([facility])    lumbar radiculopathy, lumbago  . Nocturnal leg cramps 09/27/2014  . Osteoporosis 03/10/2014  . Pneumonia   . Sickle cell trait ([place])   . Sleep apnea    borderline sleep apnea, states she no longer uses, early 2012- stopped using   . Spinal stenosis of lumbar region with radiculopathy 02/28/2014  . Spondylolisthesis of lumbar region 03/19/2011  . Type II or unspecified type diabetes mellitus without mention of complication, uncontrolled 07/08/2013    Past Surgical History:  Procedure Laterality Date  . ABDOMINAL HYSTERECTOMY    . adb.cyst     ovarian cyst  . BACK SURGERY     2012, 2015 (3 total)  . BACK SURGERY  2018   02/24/2017  . COLONOSCOPY    . EYE SURGERY     macular degeneration treatment - injections  . FEMUR IM NAIL Left 06/13/2018   Procedure: INTRAMEDULLARY (IM) NAIL FEMORAL;  Surgeon: [name], MD;  Location: [facility];  Service: Orthopedics;  Laterality: Left;  . OVARIAN CYST SURGERY    . POSTERIOR LUMBAR FUSION 4 LEVEL N/A 03/17/2017   Procedure: Decompression of Lumbar One-Two with Thoracic Ten to Lumbar Two Fusion;  Surgeon: Kristeen Miss, MD;  Location: [facility];  Service: Neurosurgery;  Laterality: N/A;  Decompression of L1-2 with T10 to L2 Fusion    No current facility-administered medications for this encounter.   Current Outpatient Medications  Medication Sig Dispense Refill Last Dose  . acetaminophen (TYLENOL) 500 MG tablet Take 1,000 mg by mouth every 8 (eight) hours as needed for mild pain or moderate pain.      Marland Kitchen aspirin EC 81 MG tablet Take 81 mg by mouth daily.     Marland Kitchen BYSTOLIC 5 MG tablet Take 5 mg by mouth daily.     . cetirizine (ZYRTEC) 10 MG tablet Take 1 tablet (10 mg total) by mouth daily as needed for allergies. 30 tablet 0   . dapagliflozin propanediol (FARXIGA) 5 MG TABS tablet Take 1 tablet by mouth once daily. DX:E11.65 (Patient taking differently: Take 5 mg by mouth every morning. Take 1 tablet by mouth once daily.  DX:E11.65) 90 tablet 2   . fluticasone (FLONASE) 50 MCG/ACT nasal spray Place 1 spray into both nostrils daily as needed for allergies. (Patient taking differently: Place 2 sprays into both nostrils 2 (two) times daily as needed for allergies. ) 0.003 g 0   . gabapentin (NEURONTIN) 600 MG tablet TAKE 1 TABLET(600 MG) BY MOUTH THREE TIMES DAILY (Patient taking differently: Take 600 mg by mouth 3 (three) times daily. ) 270 tablet 1   . ibuprofen (ADVIL) 800 MG tablet Take 1 tablet (800 mg total) by mouth every 8 (eight) hours as needed. (Patient taking differently: Take 800 mg by mouth every 8 (eight) hours as needed for moderate pain. ) 90 tablet 3   . insulin aspart (NOVOLOG FLEXPEN) 100 UNIT/ML FlexPen Inject 6 units under the skin before breakfast, 9 units at lunch, and 8-10 units at dinner. 3-4 PRN for large carbs or ice cream. DX:E11.65 (Patient taking differently: Inject 6-10 Units into the skin See admin instructions. Inject 6 units under the skin before breakfast, 9 units at  lunch, and 8-10 units at dinner. 3-4 PRN for large carbs or ice cream. DX:E11.65) 30 mL 0   . insulin degludec (TRESIBA FLEXTOUCH) 100 UNIT/ML SOPN FlexTouch Pen INJECT 22 UNITS UNDER THE SKIN EVERY DAY. DX:E11.65 (Patient taking differently: Inject 20 Units into the skin every morning. INJECT 20 UNITS UNDER THE SKIN EVERY DAY. DX:E11.65) 30 mL 0   . irbesartan (AVAPRO) 300 MG tablet Take 300 mg by mouth daily.      Marland Kitchen ketoconazole (NIZORAL) 2 % cream Apply 1 application topically See admin instructions. For 14 days for irritation when needed     . levothyroxine (SYNTHROID) 50 MCG tablet TAKE 1 TABLET(50 MCG) BY MOUTH DAILY (Patient taking differently: Take 50 mcg by mouth daily before breakfast. ) 90 tablet 1   . MAGNESIUM PO Take 250 mg by mouth 2 (two) times daily.      . meloxicam (MOBIC) 15 MG tablet Take 15 mg by mouth daily.      . Multiple Vitamins-Minerals (MULTIVITAMIN WITH MINERALS) tablet Take 1 tablet by mouth  daily.     . Multiple Vitamins-Minerals (OCUVITE ADULT FORMULA PO) Take 1 tablet by mouth daily.     . potassium chloride (KLOR-CON) 10 MEQ tablet Take 10 mEq by mouth daily as needed (low potassium).     . SYMBICORT 80-4.5 MCG/ACT inhaler Inhale 2 puffs into the lungs 2 (two) times daily as needed (shortness of breath). 1 Inhaler 0   . zinc gluconate 50 MG tablet Take 50 mg by mouth daily.     Marland Kitchen amLODipine (NORVASC) 10 MG tablet Take 1 tablet (10 mg total) by mouth daily. (Patient not taking: Reported on 08/03/2019) 30 tablet 0 Not Taking at Unknown time  . Blood Glucose Monitoring Suppl (ACCU-CHEK AVIVA PLUS) w/Device KIT Use Accu Chek Aviva as instructed to check blood sugar 4 times daily. 1 kit 0   . glucose blood test strip Use Accu Chek Aviva test strips as instructed to check blood sugar 4 times daily. 400 each 1   . HYDROcodone-acetaminophen (NORCO/VICODIN) 5-325 MG tablet Take 1 tablet by mouth every 6 (six) hours as needed for moderate pain. (Patient not taking: Reported on 08/03/2019) 30 tablet 0 Not Taking at Unknown time  . Insulin Pen Needle (BD PEN NEEDLE NANO U/F) 32G X 4 MM MISC Use to inject insulin 4 times daily. 400 each 2   . Pitavastatin Calcium (LIVALO) 1 MG TABS 1 tablet at bedtime daily (Patient not taking: Reported on 08/03/2019) 30 tablet 2 Not Taking   Allergies  Allergen Reactions  . Cymbalta [Duloxetine Hcl] Diarrhea and Other (See Comments)    Dizziness, headache, irritability  . Baclofen     jerks   . Betadine [Povidone Iodine] Swelling and Other (See Comments)    SWELLING REACTION DESCRIPTION/SEVERITY UNSPECIFIED  Reaction to betadine eye drops  . Percocet [Oxycodone-Acetaminophen]     Confusion   . Adhesive [Tape] Rash  . Lipitor [Atorvastatin] Rash    Social History   Tobacco Use  . Smoking status: Former Smoker    Packs/day: 0.10    Years: 30.00    Pack years: 3.00    Types: Cigarettes    Quit date: 2005    Years since quitting: 16.3  . Smokeless  tobacco: Never Used  Substance Use Topics  . Alcohol use: Yes    Comment: wine /w dinner on occas.    Family History  Problem Relation Age of Onset  . Ovarian cancer Mother   .  Cancer - Prostate Father   . Breast cancer Paternal Aunt   . Multiple myeloma Paternal Aunt   . Anesthesia problems Neg Hx   . Hypotension Neg Hx   . Malignant hyperthermia Neg Hx   . Pseudochol deficiency Neg Hx      Review of Systems  Musculoskeletal: Positive for joint swelling.  All other systems reviewed and are negative.   Objective:  Physical Exam  Constitutional: She is oriented to person, place, and time. She appears well-developed and well-nourished.  HENT:  Head: Normocephalic and atraumatic.  Eyes: Pupils are equal, round, and reactive to light. EOM are normal.  Cardiovascular: Normal rate.  Respiratory: Effort normal.  GI: Soft.  Musculoskeletal:     Cervical back: Normal range of motion and neck supple.     Right knee: Effusion and bony tenderness present. Decreased range of motion. Tenderness present over the medial joint line and lateral joint line. Abnormal alignment and abnormal meniscus.  Neurological: She is alert and oriented to person, place, and time.  Skin: Skin is warm and dry.  Psychiatric: She has a normal mood and affect.    Vital signs in last 24 hours:    Labs:   Estimated body mass index is 29.36 kg/m as calculated from the following:   Height as of 08/10/19: 5' 5" (1.651 m).   Weight as of 08/10/19: 80 kg.   Imaging Review Plain radiographs demonstrate severe degenerative joint disease of the right knee(s). The overall alignment isneutral. The bone quality appears to be good for age and reported activity level.      Assessment/Plan:  End stage arthritis, right knee   The patient history, physical examination, clinical judgment of the provider and imaging studies are consistent with end stage degenerative joint disease of the right knee(s) and total  knee arthroplasty is deemed medically necessary. The treatment options including medical management, injection therapy arthroscopy and arthroplasty were discussed at length. The risks and benefits of total knee arthroplasty were presented and reviewed. The risks due to aseptic loosening, infection, stiffness, patella tracking problems, thromboembolic complications and other imponderables were discussed. The patient acknowledged the explanation, agreed to proceed with the plan and consent was signed. Patient is being admitted for inpatient treatment for surgery, pain control, PT, OT, prophylactic antibiotics, VTE prophylaxis, progressive ambulation and ADL's and discharge planning. The patient is planning to be discharged home with home health services

## 2019-08-12 NOTE — Telephone Encounter (Signed)
Ortho bundle Pre-op call completed. 

## 2019-08-12 NOTE — Telephone Encounter (Signed)
Called pt and gave her MD message. Pt verbalized understanding. 

## 2019-08-12 NOTE — Care Plan (Signed)
RNCM contacted patient for pre-op call to discuss her upcoming Right TKA with Dr. Ninfa Linden on 08/13/19. Patient will be an Ortho bundle patient through [facility]. Reviewed all information related to this and patient in agreement for case management. She will have someone in her home (paid CG) that will be coming to assist after surgery. She requested [facility] for HHPT after short hospital stay as she has been active with them for therapy in the past. She has all needed DME. She also requested OPPT be scheduled with [facility] location. CM will continue to follow for all CM needs.

## 2019-08-13 ENCOUNTER — Ambulatory Visit (HOSPITAL_COMMUNITY): Payer: Medicare PPO | Admitting: Physician Assistant

## 2019-08-13 ENCOUNTER — Other Ambulatory Visit: Payer: Self-pay

## 2019-08-13 ENCOUNTER — Encounter (HOSPITAL_COMMUNITY)
Admission: RE | Disposition: A | Discharge status: Home / Self Care | Payer: Self-pay | Source: Ambulatory Visit | Attending: Orthopaedic Surgery

## 2019-08-13 ENCOUNTER — Encounter (HOSPITAL_COMMUNITY): Payer: Self-pay | Admitting: Orthopaedic Surgery

## 2019-08-13 ENCOUNTER — Observation Stay (HOSPITAL_COMMUNITY): Payer: Medicare PPO

## 2019-08-13 ENCOUNTER — Ambulatory Visit (HOSPITAL_COMMUNITY): Payer: Medicare PPO | Admitting: Anesthesiology

## 2019-08-13 ENCOUNTER — Observation Stay (HOSPITAL_COMMUNITY)
Admission: RE | Admit: 2019-08-13 | Discharge: 2019-08-14 | Disposition: A | Discharge status: Home / Self Care | Payer: Medicare PPO | Source: Ambulatory Visit | Attending: Orthopaedic Surgery | Admitting: Orthopaedic Surgery

## 2019-08-13 DIAGNOSIS — F419 Anxiety disorder, unspecified: Secondary | ICD-10-CM | POA: Insufficient documentation

## 2019-08-13 DIAGNOSIS — G253 Myoclonus: Secondary | ICD-10-CM | POA: Diagnosis not present

## 2019-08-13 DIAGNOSIS — G4730 Sleep apnea, unspecified: Secondary | ICD-10-CM | POA: Diagnosis not present

## 2019-08-13 DIAGNOSIS — Z888 Allergy status to other drugs, medicaments and biological substances status: Secondary | ICD-10-CM | POA: Insufficient documentation

## 2019-08-13 DIAGNOSIS — G4700 Insomnia, unspecified: Secondary | ICD-10-CM | POA: Insufficient documentation

## 2019-08-13 DIAGNOSIS — M810 Age-related osteoporosis without current pathological fracture: Secondary | ICD-10-CM | POA: Insufficient documentation

## 2019-08-13 DIAGNOSIS — M48062 Spinal stenosis, lumbar region with neurogenic claudication: Secondary | ICD-10-CM | POA: Diagnosis not present

## 2019-08-13 DIAGNOSIS — Z471 Aftercare following joint replacement surgery: Secondary | ICD-10-CM | POA: Diagnosis not present

## 2019-08-13 DIAGNOSIS — Z981 Arthrodesis status: Secondary | ICD-10-CM | POA: Diagnosis not present

## 2019-08-13 DIAGNOSIS — Z96651 Presence of right artificial knee joint: Secondary | ICD-10-CM

## 2019-08-13 DIAGNOSIS — E785 Hyperlipidemia, unspecified: Secondary | ICD-10-CM | POA: Diagnosis not present

## 2019-08-13 DIAGNOSIS — G9340 Encephalopathy, unspecified: Secondary | ICD-10-CM | POA: Diagnosis not present

## 2019-08-13 DIAGNOSIS — J45909 Unspecified asthma, uncomplicated: Secondary | ICD-10-CM | POA: Diagnosis not present

## 2019-08-13 DIAGNOSIS — E039 Hypothyroidism, unspecified: Secondary | ICD-10-CM | POA: Insufficient documentation

## 2019-08-13 DIAGNOSIS — E1142 Type 2 diabetes mellitus with diabetic polyneuropathy: Secondary | ICD-10-CM | POA: Insufficient documentation

## 2019-08-13 DIAGNOSIS — Z794 Long term (current) use of insulin: Secondary | ICD-10-CM | POA: Insufficient documentation

## 2019-08-13 DIAGNOSIS — I10 Essential (primary) hypertension: Secondary | ICD-10-CM | POA: Diagnosis not present

## 2019-08-13 DIAGNOSIS — Z8042 Family history of malignant neoplasm of prostate: Secondary | ICD-10-CM | POA: Insufficient documentation

## 2019-08-13 DIAGNOSIS — G8918 Other acute postprocedural pain: Secondary | ICD-10-CM | POA: Diagnosis not present

## 2019-08-13 DIAGNOSIS — Z79899 Other long term (current) drug therapy: Secondary | ICD-10-CM | POA: Insufficient documentation

## 2019-08-13 DIAGNOSIS — G709 Myoneural disorder, unspecified: Secondary | ICD-10-CM | POA: Insufficient documentation

## 2019-08-13 DIAGNOSIS — Z791 Long term (current) use of non-steroidal anti-inflammatories (NSAID): Secondary | ICD-10-CM | POA: Insufficient documentation

## 2019-08-13 DIAGNOSIS — Z90710 Acquired absence of both cervix and uterus: Secondary | ICD-10-CM | POA: Insufficient documentation

## 2019-08-13 DIAGNOSIS — Z885 Allergy status to narcotic agent status: Secondary | ICD-10-CM | POA: Insufficient documentation

## 2019-08-13 DIAGNOSIS — K219 Gastro-esophageal reflux disease without esophagitis: Secondary | ICD-10-CM | POA: Diagnosis not present

## 2019-08-13 DIAGNOSIS — Z881 Allergy status to other antibiotic agents status: Secondary | ICD-10-CM | POA: Insufficient documentation

## 2019-08-13 DIAGNOSIS — M797 Fibromyalgia: Secondary | ICD-10-CM | POA: Diagnosis not present

## 2019-08-13 DIAGNOSIS — D573 Sickle-cell trait: Secondary | ICD-10-CM | POA: Diagnosis not present

## 2019-08-13 DIAGNOSIS — Z803 Family history of malignant neoplasm of breast: Secondary | ICD-10-CM | POA: Insufficient documentation

## 2019-08-13 DIAGNOSIS — Z8041 Family history of malignant neoplasm of ovary: Secondary | ICD-10-CM | POA: Insufficient documentation

## 2019-08-13 DIAGNOSIS — Z91048 Other nonmedicinal substance allergy status: Secondary | ICD-10-CM | POA: Insufficient documentation

## 2019-08-13 DIAGNOSIS — Z7951 Long term (current) use of inhaled steroids: Secondary | ICD-10-CM | POA: Insufficient documentation

## 2019-08-13 DIAGNOSIS — Z808 Family history of malignant neoplasm of other organs or systems: Secondary | ICD-10-CM | POA: Insufficient documentation

## 2019-08-13 DIAGNOSIS — M1711 Unilateral primary osteoarthritis, right knee: Principal | ICD-10-CM | POA: Insufficient documentation

## 2019-08-13 DIAGNOSIS — E876 Hypokalemia: Secondary | ICD-10-CM | POA: Insufficient documentation

## 2019-08-13 DIAGNOSIS — Z7982 Long term (current) use of aspirin: Secondary | ICD-10-CM | POA: Insufficient documentation

## 2019-08-13 DIAGNOSIS — Z87891 Personal history of nicotine dependence: Secondary | ICD-10-CM | POA: Insufficient documentation

## 2019-08-13 HISTORY — PX: TOTAL KNEE ARTHROPLASTY: SHX125

## 2019-08-13 LAB — GLUCOSE, CAPILLARY
Glucose-Capillary: 121 mg/dL — ABNORMAL HIGH (ref 70–99)
Glucose-Capillary: 161 mg/dL — ABNORMAL HIGH (ref 70–99)
Glucose-Capillary: 245 mg/dL — ABNORMAL HIGH (ref 70–99)
Glucose-Capillary: 210 mg/dL — ABNORMAL HIGH (ref 70–99)

## 2019-08-13 SURGERY — ARTHROPLASTY, KNEE, TOTAL
Anesthesia: General | Site: Knee | Laterality: Right

## 2019-08-13 MED ORDER — ONDANSETRON HCL 4 MG/2ML IJ SOLN: 4.0000 mg | Freq: Four times a day (QID) | INTRAMUSCULAR | Status: DC | PRN

## 2019-08-13 MED ORDER — SODIUM CHLORIDE 0.9 % IR SOLN
Status: DC | PRN
  Administered 2019-08-13: 1000 mL

## 2019-08-13 MED ORDER — METHOCARBAMOL 500 MG IVPB - SIMPLE MED
INTRAVENOUS | Status: AC
  Filled 2019-08-13: qty 50

## 2019-08-13 MED ORDER — NEBIVOLOL HCL 5 MG PO TABS
5.0000 mg | ORAL_TABLET | Freq: Every day | ORAL | Status: DC
  Administered 2019-08-14: 09:00:00 5 mg via ORAL
  Filled 2019-08-13: qty 1

## 2019-08-13 MED ORDER — KETOROLAC TROMETHAMINE 15 MG/ML IJ SOLN
7.5000 mg | Freq: Four times a day (QID) | INTRAMUSCULAR | Status: DC
  Administered 2019-08-13 – 2019-08-14 (×3): 7.5 mg via INTRAVENOUS
  Filled 2019-08-13 (×4): qty 1

## 2019-08-13 MED ORDER — DEXAMETHASONE SODIUM PHOSPHATE 10 MG/ML IJ SOLN
INTRAMUSCULAR | Status: DC | PRN
  Administered 2019-08-13: 10 mg via INTRAVENOUS

## 2019-08-13 MED ORDER — TRANEXAMIC ACID-NACL 1000-0.7 MG/100ML-% IV SOLN
1000.0000 mg | INTRAVENOUS | Status: AC
  Administered 2019-08-13: 12:00:00 1000 mg via INTRAVENOUS
  Filled 2019-08-13: qty 100

## 2019-08-13 MED ORDER — ZINC GLUCONATE 50 MG PO TABS: 50.0000 mg | ORAL_TABLET | Freq: Every day | ORAL | Status: DC

## 2019-08-13 MED ORDER — PROPOFOL 10 MG/ML IV BOLUS
INTRAVENOUS | Status: DC | PRN
  Administered 2019-08-13: 120 mg via INTRAVENOUS

## 2019-08-13 MED ORDER — CEFAZOLIN SODIUM-DEXTROSE 2-4 GM/100ML-% IV SOLN
2.0000 g | INTRAVENOUS | Status: AC
  Administered 2019-08-13: 12:00:00 2 g via INTRAVENOUS
  Filled 2019-08-13: qty 100

## 2019-08-13 MED ORDER — BUPIVACAINE HCL (PF) 0.25 % IJ SOLN
INTRAMUSCULAR | Status: AC
  Filled 2019-08-13: qty 30

## 2019-08-13 MED ORDER — LACTATED RINGERS IV SOLN: INTRAVENOUS | Status: DC

## 2019-08-13 MED ORDER — CEFAZOLIN SODIUM-DEXTROSE 1-4 GM/50ML-% IV SOLN
1.0000 g | Freq: Four times a day (QID) | INTRAVENOUS | Status: AC
  Administered 2019-08-13 – 2019-08-14 (×2): 1 g via INTRAVENOUS
  Filled 2019-08-13 (×2): qty 50

## 2019-08-13 MED ORDER — ACETAMINOPHEN 325 MG PO TABS: 325.0000 mg | ORAL_TABLET | ORAL | Status: DC | PRN

## 2019-08-13 MED ORDER — MENTHOL 3 MG MT LOZG: 1.0000 | LOZENGE | OROMUCOSAL | Status: DC | PRN

## 2019-08-13 MED ORDER — METOCLOPRAMIDE HCL 5 MG/ML IJ SOLN: 5.0000 mg | Freq: Three times a day (TID) | INTRAMUSCULAR | Status: DC | PRN

## 2019-08-13 MED ORDER — FENTANYL CITRATE (PF) 100 MCG/2ML IJ SOLN
INTRAMUSCULAR | Status: AC
  Filled 2019-08-13: qty 2

## 2019-08-13 MED ORDER — OXYCODONE HCL 5 MG/5ML PO SOLN: 5.0000 mg | Freq: Once | ORAL | Status: DC | PRN

## 2019-08-13 MED ORDER — ONDANSETRON HCL 4 MG/2ML IJ SOLN: 4.0000 mg | Freq: Once | INTRAMUSCULAR | Status: DC | PRN

## 2019-08-13 MED ORDER — ACETAMINOPHEN 160 MG/5ML PO SOLN: 325.0000 mg | ORAL | Status: DC | PRN

## 2019-08-13 MED ORDER — OXYCODONE HCL 5 MG PO TABS: 10.0000 mg | ORAL_TABLET | ORAL | Status: DC | PRN

## 2019-08-13 MED ORDER — 0.9 % SODIUM CHLORIDE (POUR BTL) OPTIME
TOPICAL | Status: DC | PRN
  Administered 2019-08-13: 1000 mL

## 2019-08-13 MED ORDER — FENTANYL CITRATE (PF) 100 MCG/2ML IJ SOLN
50.0000 ug | INTRAMUSCULAR | Status: DC
  Administered 2019-08-13: 11:00:00 50 ug via INTRAVENOUS
  Filled 2019-08-13: qty 2

## 2019-08-13 MED ORDER — INSULIN ASPART 100 UNIT/ML ~~LOC~~ SOLN
6.0000 [IU] | Freq: Three times a day (TID) | SUBCUTANEOUS | Status: DC
  Administered 2019-08-13 – 2019-08-14 (×3): 6 [IU] via SUBCUTANEOUS

## 2019-08-13 MED ORDER — CANAGLIFLOZIN 100 MG PO TABS
100.0000 mg | ORAL_TABLET | Freq: Every day | ORAL | Status: DC
  Administered 2019-08-14: 100 mg via ORAL
  Filled 2019-08-13: qty 1

## 2019-08-13 MED ORDER — MIDAZOLAM HCL 2 MG/2ML IJ SOLN
1.0000 mg | INTRAMUSCULAR | Status: DC
  Administered 2019-08-13: 2 mg via INTRAVENOUS
  Filled 2019-08-13: qty 2

## 2019-08-13 MED ORDER — HYDROMORPHONE HCL 1 MG/ML IJ SOLN: 0.5000 mg | INTRAMUSCULAR | Status: DC | PRN

## 2019-08-13 MED ORDER — ONDANSETRON HCL 4 MG/2ML IJ SOLN
INTRAMUSCULAR | Status: DC | PRN
  Administered 2019-08-13: 4 mg via INTRAVENOUS

## 2019-08-13 MED ORDER — MOMETASONE FURO-FORMOTEROL FUM 100-5 MCG/ACT IN AERO: 2.0000 | INHALATION_SPRAY | Freq: Two times a day (BID) | RESPIRATORY_TRACT | Status: DC | PRN

## 2019-08-13 MED ORDER — IRBESARTAN 300 MG PO TABS
300.0000 mg | ORAL_TABLET | Freq: Every day | ORAL | Status: DC
  Administered 2019-08-13 – 2019-08-14 (×2): 300 mg via ORAL
  Filled 2019-08-13 (×2): qty 1
  Filled 2019-08-13 (×2): qty 2

## 2019-08-13 MED ORDER — MAGNESIUM GLUCONATE 500 MG PO TABS
250.0000 mg | ORAL_TABLET | Freq: Two times a day (BID) | ORAL | Status: DC
  Administered 2019-08-13 – 2019-08-14 (×2): 250 mg via ORAL
  Filled 2019-08-13 (×3): qty 1

## 2019-08-13 MED ORDER — INSULIN GLARGINE 100 UNIT/ML ~~LOC~~ SOLN
20.0000 [IU] | Freq: Every day | SUBCUTANEOUS | Status: DC
  Administered 2019-08-14: 20 [IU] via SUBCUTANEOUS
  Filled 2019-08-13: qty 0.2

## 2019-08-13 MED ORDER — EPHEDRINE SULFATE-NACL 50-0.9 MG/10ML-% IV SOSY
PREFILLED_SYRINGE | INTRAVENOUS | Status: DC | PRN
  Administered 2019-08-13: 10 mg via INTRAVENOUS

## 2019-08-13 MED ORDER — ROPIVACAINE HCL 7.5 MG/ML IJ SOLN
INTRAMUSCULAR | Status: DC | PRN
  Administered 2019-08-13: 25 mL via PERINEURAL

## 2019-08-13 MED ORDER — PHENOL 1.4 % MT LIQD: 1.0000 | OROMUCOSAL | Status: DC | PRN

## 2019-08-13 MED ORDER — LIDOCAINE 2% (20 MG/ML) 5 ML SYRINGE
INTRAMUSCULAR | Status: DC | PRN
  Administered 2019-08-13: 60 mg via INTRAVENOUS

## 2019-08-13 MED ORDER — ASPIRIN 81 MG PO CHEW
81.0000 mg | CHEWABLE_TABLET | Freq: Two times a day (BID) | ORAL | Status: DC
  Administered 2019-08-13 – 2019-08-14 (×2): 81 mg via ORAL
  Filled 2019-08-13 (×2): qty 1

## 2019-08-13 MED ORDER — INSULIN ASPART 100 UNIT/ML FLEXPEN: 6.0000 [IU] | PEN_INJECTOR | SUBCUTANEOUS | Status: DC

## 2019-08-13 MED ORDER — INSULIN DEGLUDEC 100 UNIT/ML ~~LOC~~ SOPN: 20.0000 [IU] | PEN_INJECTOR | SUBCUTANEOUS | Status: DC

## 2019-08-13 MED ORDER — GABAPENTIN 300 MG PO CAPS
600.0000 mg | ORAL_CAPSULE | Freq: Three times a day (TID) | ORAL | Status: DC
  Administered 2019-08-13 – 2019-08-14 (×4): 600 mg via ORAL
  Filled 2019-08-13 (×4): qty 2

## 2019-08-13 MED ORDER — LEVOTHYROXINE SODIUM 50 MCG PO TABS
50.0000 ug | ORAL_TABLET | Freq: Every day | ORAL | Status: DC
  Administered 2019-08-14: 50 ug via ORAL
  Filled 2019-08-13: qty 1

## 2019-08-13 MED ORDER — METHOCARBAMOL 500 MG IVPB - SIMPLE MED
500.0000 mg | Freq: Four times a day (QID) | INTRAVENOUS | Status: DC | PRN
  Administered 2019-08-13: 500 mg via INTRAVENOUS
  Filled 2019-08-13: qty 50

## 2019-08-13 MED ORDER — ZINC SULFATE 220 (50 ZN) MG PO CAPS
220.0000 mg | ORAL_CAPSULE | Freq: Every day | ORAL | Status: DC
  Administered 2019-08-13 – 2019-08-14 (×2): 220 mg via ORAL
  Filled 2019-08-13 (×2): qty 1

## 2019-08-13 MED ORDER — BUPIVACAINE LIPOSOME 1.3 % IJ SUSP
INTRAMUSCULAR | Status: DC | PRN
  Administered 2019-08-13: 10 mL via PERINEURAL

## 2019-08-13 MED ORDER — STERILE WATER FOR IRRIGATION IR SOLN
Status: DC | PRN
  Administered 2019-08-13: 2000 mL

## 2019-08-13 MED ORDER — INSULIN ASPART 100 UNIT/ML ~~LOC~~ SOLN
0.0000 [IU] | Freq: Every day | SUBCUTANEOUS | Status: DC
  Administered 2019-08-13: 2 [IU] via SUBCUTANEOUS

## 2019-08-13 MED ORDER — MEPERIDINE HCL 50 MG/ML IJ SOLN: 6.2500 mg | INTRAMUSCULAR | Status: DC | PRN

## 2019-08-13 MED ORDER — DOCUSATE SODIUM 100 MG PO CAPS
100.0000 mg | ORAL_CAPSULE | Freq: Two times a day (BID) | ORAL | Status: DC
  Administered 2019-08-13 – 2019-08-14 (×2): 100 mg via ORAL
  Filled 2019-08-13 (×2): qty 1

## 2019-08-13 MED ORDER — BUPIVACAINE HCL (PF) 0.25 % IJ SOLN
INTRAMUSCULAR | Status: DC | PRN
  Administered 2019-08-13: 30 mL

## 2019-08-13 MED ORDER — ONDANSETRON HCL 4 MG/2ML IJ SOLN
INTRAMUSCULAR | Status: AC
  Filled 2019-08-13: qty 2

## 2019-08-13 MED ORDER — METHOCARBAMOL 500 MG PO TABS
500.0000 mg | ORAL_TABLET | Freq: Four times a day (QID) | ORAL | Status: DC | PRN
  Filled 2019-08-13: qty 1

## 2019-08-13 MED ORDER — FLUTICASONE PROPIONATE 50 MCG/ACT NA SUSP
2.0000 | Freq: Every day | NASAL | Status: DC
  Administered 2019-08-14: 09:00:00 2 via NASAL
  Filled 2019-08-13: qty 16

## 2019-08-13 MED ORDER — METOCLOPRAMIDE HCL 5 MG PO TABS: 5.0000 mg | ORAL_TABLET | Freq: Three times a day (TID) | ORAL | Status: DC | PRN

## 2019-08-13 MED ORDER — OXYCODONE HCL 5 MG PO TABS
5.0000 mg | ORAL_TABLET | ORAL | Status: DC | PRN
  Administered 2019-08-14 (×2): 5 mg via ORAL
  Filled 2019-08-13 (×2): qty 1

## 2019-08-13 MED ORDER — ACETAMINOPHEN 325 MG PO TABS: 325.0000 mg | ORAL_TABLET | Freq: Four times a day (QID) | ORAL | Status: DC | PRN

## 2019-08-13 MED ORDER — FENTANYL CITRATE (PF) 100 MCG/2ML IJ SOLN
25.0000 ug | INTRAMUSCULAR | Status: DC | PRN
  Administered 2019-08-13 (×3): 50 ug via INTRAVENOUS

## 2019-08-13 MED ORDER — ONDANSETRON HCL 4 MG PO TABS: 4.0000 mg | ORAL_TABLET | Freq: Four times a day (QID) | ORAL | Status: DC | PRN

## 2019-08-13 MED ORDER — FENTANYL CITRATE (PF) 100 MCG/2ML IJ SOLN
INTRAMUSCULAR | Status: DC | PRN
  Administered 2019-08-13 (×2): 50 ug via INTRAVENOUS

## 2019-08-13 MED ORDER — SODIUM CHLORIDE 0.9 % IV SOLN: INTRAVENOUS | Status: DC

## 2019-08-13 MED ORDER — OXYCODONE HCL 5 MG PO TABS: 5.0000 mg | ORAL_TABLET | Freq: Once | ORAL | Status: DC | PRN

## 2019-08-13 MED ORDER — INSULIN ASPART 100 UNIT/ML ~~LOC~~ SOLN
0.0000 [IU] | Freq: Three times a day (TID) | SUBCUTANEOUS | Status: DC
  Administered 2019-08-13: 2 [IU] via SUBCUTANEOUS

## 2019-08-13 MED ORDER — DIPHENHYDRAMINE HCL 12.5 MG/5ML PO ELIX: 12.5000 mg | ORAL_SOLUTION | ORAL | Status: DC | PRN

## 2019-08-13 SURGICAL SUPPLY — 56 items
APL SKNCLS STERI-STRIP NONHPOA (GAUZE/BANDAGES/DRESSINGS)
BAG SPEC THK2 15X12 ZIP CLS (MISCELLANEOUS)
BAG ZIPLOCK 12X15 (MISCELLANEOUS) IMPLANT
BEARIN INSERT TIBIAL SZ 3 11 (Insert) ×2 IMPLANT
BEARING INSERT TIBIAL SZ 3 11 (Insert) IMPLANT
BENZOIN TINCTURE PRP APPL 2/3 (GAUZE/BANDAGES/DRESSINGS) IMPLANT
BLADE SAG 18X100X1.27 (BLADE) IMPLANT
BLADE SURG SZ10 CARB STEEL (BLADE) ×4 IMPLANT
BNDG ELASTIC 6X5.8 VLCR STR LF (GAUZE/BANDAGES/DRESSINGS) ×3 IMPLANT
BOWL SMART MIX CTS (DISPOSABLE) IMPLANT
BSPLAT TIB 3 KN TRITANIUM (Knees) ×1 IMPLANT
COVER SURGICAL LIGHT HANDLE (MISCELLANEOUS) ×2 IMPLANT
COVER WAND RF STERILE (DRAPES) IMPLANT
CUFF TOURN SGL QUICK 34 (TOURNIQUET CUFF) ×2
CUFF TRNQT CYL 34X4.125X (TOURNIQUET CUFF) ×1 IMPLANT
DECANTER SPIKE VIAL GLASS SM (MISCELLANEOUS) IMPLANT
DRAPE U-SHAPE 47X51 STRL (DRAPES) ×2 IMPLANT
DRSG PAD ABDOMINAL 8X10 ST (GAUZE/BANDAGES/DRESSINGS) ×4 IMPLANT
DURAPREP 26ML APPLICATOR (WOUND CARE) ×2 IMPLANT
ELECT REM PT RETURN 15FT ADLT (MISCELLANEOUS) ×2 IMPLANT
FEMORAL POSTERIOR SZ3 RT (Joint) IMPLANT
GAUZE SPONGE 4X4 12PLY STRL (GAUZE/BANDAGES/DRESSINGS) ×2 IMPLANT
GAUZE XEROFORM 1X8 LF (GAUZE/BANDAGES/DRESSINGS) IMPLANT
GLOVE BIO SURGEON STRL SZ7.5 (GLOVE) ×2 IMPLANT
GLOVE BIOGEL PI IND STRL 8 (GLOVE) ×2 IMPLANT
GLOVE BIOGEL PI INDICATOR 8 (GLOVE) ×2
GLOVE ECLIPSE 8.0 STRL XLNG CF (GLOVE) ×2 IMPLANT
GOWN STRL REUS W/TWL XL LVL3 (GOWN DISPOSABLE) ×4 IMPLANT
HANDPIECE INTERPULSE COAX TIP (DISPOSABLE) ×2
HOLDER FOLEY CATH W/STRAP (MISCELLANEOUS) IMPLANT
IMMOBILIZER KNEE 20 (SOFTGOODS) ×2 IMPLANT
IMMOBILIZER KNEE 20 THIGH 36 (SOFTGOODS) ×1 IMPLANT
KIT TURNOVER KIT A (KITS) IMPLANT
KNEE PATELLA ASYMMETRIC 9X29 (Knees) ×1 IMPLANT
KNEE TIBIAL COMPONENT SZ3 (Knees) ×1 IMPLANT
NS IRRIG 1000ML POUR BTL (IV SOLUTION) ×2 IMPLANT
PACK TOTAL KNEE CUSTOM (KITS) ×2 IMPLANT
PADDING CAST COTTON 6X4 STRL (CAST SUPPLIES) ×4 IMPLANT
PENCIL SMOKE EVACUATOR (MISCELLANEOUS) IMPLANT
PIN FLUTED HEDLESS FIX 3.5X1/8 (PIN) ×1 IMPLANT
POSTERIOR FEMORAL SZ3 RT (Joint) ×2 IMPLANT
PROTECTOR NERVE ULNAR (MISCELLANEOUS) ×2 IMPLANT
SET HNDPC FAN SPRY TIP SCT (DISPOSABLE) ×1 IMPLANT
SET PAD KNEE POSITIONER (MISCELLANEOUS) ×2 IMPLANT
STAPLER VISISTAT 35W (STAPLE) IMPLANT
STRIP CLOSURE SKIN 1/2X4 (GAUZE/BANDAGES/DRESSINGS) ×1 IMPLANT
SUT MNCRL AB 4-0 PS2 18 (SUTURE) IMPLANT
SUT VIC AB 0 CT1 27 (SUTURE) ×2
SUT VIC AB 0 CT1 27XBRD ANTBC (SUTURE) ×1 IMPLANT
SUT VIC AB 1 CT1 36 (SUTURE) ×4 IMPLANT
SUT VIC AB 2-0 CT1 27 (SUTURE) ×4
SUT VIC AB 2-0 CT1 TAPERPNT 27 (SUTURE) ×2 IMPLANT
TRAY FOLEY MTR SLVR 16FR STAT (SET/KITS/TRAYS/PACK) ×2 IMPLANT
WATER STERILE IRR 1000ML POUR (IV SOLUTION) ×2 IMPLANT
WRAP KNEE MAXI GEL POST OP (GAUZE/BANDAGES/DRESSINGS) ×2 IMPLANT
YANKAUER SUCT BULB TIP 10FT TU (MISCELLANEOUS) ×2 IMPLANT

## 2019-08-13 NOTE — Progress Notes (Signed)
Orthopedic Tech Progress Note Patient Details:  [name] 03-30-1941 [id number]  Patient ID: [name], female   DOB: Apr 19, 1941, 79 y.o.   MRN: [id number]   [name] 08/13/2019, 5:54 PM cpm removed after 4 hrs

## 2019-08-13 NOTE — Op Note (Signed)
NAME: [name], [name] MEDICAL RECORD [id number] ACCOUNT [id number] DATE OF BIRTH:Feb 17, 1941 FACILITY: [facility] LOCATION: [id number] PHYSICIAN:Bobbyjo Marulanda Kerry [place], MD  OPERATIVE REPORT  DATE OF PROCEDURE:  08/13/2019  PREOPERATIVE DIAGNOSIS:  Primary osteoarthritis and degenerative joint disease, right knee.  POSTOPERATIVE DIAGNOSIS:  Primary osteoarthritis and degenerative joint disease, right knee.  PROCEDURE:  Right total knee arthroplasty.  IMPLANTS:  Stryker Triathlon press-fit knee system with size 3 femur, size 3 tibial tray, 11 mm fixed bearing polyethylene insert, size 29 patellar button.  SURGEON:  Lind Guest. Ninfa Linden, MD  ASSISTANT:  Erskine Emery, PA-C  ANESTHESIA: 1.  Right lower extremity adductor canal block. 2.  General. 3.  Local with an intra-articular plain Marcaine.  TOURNIQUET TIME:  Less than 1 hour.  ANTIBIOTICS:  Two grams IV Ancef.  ESTIMATED BLOOD LOSS:  100 mL.  COMPLICATIONS:  None.  INDICATIONS:  The patient is a 79 year old female well known to me.  She has debilitating pain involving her right knee and known osteoarthritis of the right knee well documented radiographically and clinically.  She has tried and failed all forms of  conservative treatment.  At this point, her right knee pain is daily and is detrimentally affecting her activities of daily living, quality of life, and her mobility.  At this point, she does wish to proceed with total knee arthroplasty and I agree with  this.  She understands with this type of surgery, there is at risk of acute blood loss anemia, nerve or vessel injury, fracture, infection, DVT, and implant failure.  She understands her goals are to decrease pain, improve mobility and overall improve  quality of life.  DESCRIPTION OF PROCEDURE:  After informed consent was obtained and appropriate right knee was marked she was brought to the operating room and placed supine on the operating table.  General anesthesia  was then obtained.  Since she had significant spinal  surgery in the past, an adductor canal block had been obtained in the holding room, of the right lower extremity.  A nonsterile tourniquet was placed around her upper right thigh and her right thigh, knee, leg, and ankle were prepped and draped with  DuraPrep and sterile drapes including a sterile stockinette.  Time-out was called and she was identified as correct patient, correct right knee and then used an Esmarch to wrap that leg and tourniquet was inflated to 300 mm of pressure.  I then made a  direct midline incision over the patella and carried this proximally and distally.  A Cobb dissected down the knee joint carried out a medial parapatellar arthrotomy finding significant arthritis throughout her right knee.  With the knee in a flexed  position, we removed remnants of the ACL, PCL, medial and lateral meniscus.  We removed osteophytes from around the knee.  We then used the extramedullary cutting guide for making our proximal tibia cut.  We set this to take 9 mm off the high side,  correcting varus and valgus and neutral slope.  We made this cut without difficulty and I decided to take it down 2 more millimeters, which I went ahead and did.  We then went to the femur for our distal femoral cut using intramedullary guide through the  notch.  We set this for right knee at 5 degrees externally rotated for an 8 mm distal femoral cut.  We made this cut without difficulty, but based on the amount of bone we took I took another 2 mm.  With the knee back down to  full extension we used a 9  mm extension block and we achieved full extension.  We then back to the femur and put our femoral sizing guide based off the epicondylar axis.  Based on this, we chose a size 3 right femur.  We put a 4-in-1 cutting block for a size 3 right femur, made  our anterior and posterior cuts, followed by our chamfer cuts.  We then made our femoral box cut.  Attention was then  turned back to the tibia.  We chose a size 3 tibia tray for coverage setting rotation off the tibial tubercle and the femur.  We made  our keel punch off of this.  Her bone quality was surprisingly very hard and very good to the point that we felt that we could successfully place her knee with press-fit implants.  We placed our trial 3 tibial tray followed by our trial 3 right femur.   We placed a 9 mm fixed bearing polyethylene insert.  We then drilled 3 holes for a press-fit size 29 patellar button.  With all trial instrumentation removed we put her through range of motion.  We felt like we needed just a little bit thicker poly, but  otherwise we were pleased.  We then removed all trial components from the knee.  We put our finishing tibia black foam for a size 3 tibia.  We then irrigated the knee with normal saline solution using pulsatile lavage.  One more time, we made sure that  we had cleaned out any debris from the back of the knee.  We then put the knee in a flexed position and dried the knee real well.  We placed our real Stryker Triathlon press-fit tibial tray size 3 followed by real size 3 right femur.  We placed our real  11 mm fixed bearing polyethylene insert and press-fit our size 29 patellar button.  We put the knee through several cycles of motion.  We were pleased with range of motion and stability.  We then let the tourniquet down and hemostasis obtained with  electrocautery.  We closed the arthrotomy with interrupted #1 Vicryl suture followed by 0 Vicryl to close the deep tissue, 2-0 Vicryl to close the subcutaneous tissue.  We reapproximated the subcuticular tissue with a Monocryl suture and Steri-Strips  were applied over the skin incision.  Well-padded sterile dressing was placed.  She was awakened, extubated, and taken to recovery room in stable condition.  All final counts were correct.  There were no complications noted.  Of note, Benita Stabile, PA-C,  assisted during the entire case  and his assistance was crucial for facilitating all aspects of this case.  CN/NUANCE  D:08/13/2019 T:08/13/2019 JOB:[id number]

## 2019-08-13 NOTE — Evaluation (Signed)
Physical Therapy Evaluation Patient Details Name: [name] MRN: [id number] DOB: 02-Feb-1941 Today's Date: 08/13/2019   History of Present Illness  Patient is 79  y.o. female s/p Rt TKA on 08/13/19 with PMH significant for DM, Osteoporosis, spondylolithesis, lumbar radiculopathy, hypothyroidis, HTN, GERD, fibromyalgia, neuropathy, OA, asthma, anxiety, myoclonic jerking, memory disorder, Lumbar fusion T10-L2, Lt IM nail in hip.    Clinical Impression  LEXIE DAMPIER is a 79 y.o. female POD 0 s/p Rt TKA. Patient reports independence with mobility at baseline. Patient is now limited by functional impairments (see PT problem list below) and requires min assist for transfers and gait with RW. Patient was able to ambulate ~8 feet with RW and min assist and was limited by pain and fatigue. Patient instructed in exercise to facilitate circulation. Patient will benefit from continued skilled PT interventions to address impairments and progress towards PLOF. Acute PT will follow to progress mobility and stair training in preparation for safe discharge home.     Follow Up Recommendations Follow surgeon's recommendation for DC plan and follow-up therapies;Home health PT(pt reports she is to have a home aid from her [facility] services)    Equipment Recommendations  None recommended by PT    Recommendations for Other Services       Precautions / Restrictions Precautions Precautions: Fall Restrictions Weight Bearing Restrictions: No Other Position/Activity Restrictions: WBAT      Mobility  Bed Mobility Overal bed mobility: Needs Assistance Bed Mobility: Supine to Sit     Supine to sit: Min assist;HOB elevated     General bed mobility comments: pt slow to mobilize,   Transfers Overall transfer level: Needs assistance Equipment used: Rolling walker (2 wheeled) Transfers: Sit to/from [facility] Sit to Stand: Min assist;From elevated surface Stand pivot transfers: Min  assist;From elevated surface       General transfer comment: cues for safe hand placement/technique. assist for power up and cues for step pattern. Pt performed sit<>stand form EOB. and then stand step transfer form recliner <>BSC.   Ambulation/Gait Ambulation/Gait assistance: Min assist Gait Distance (Feet): 8 Feet Assistive device: Rolling walker (2 wheeled) Gait Pattern/deviations: Step-to pattern;Decreased step length - right;Decreased step length - left;Decreased stance time - right;Decreased stride length;Decreased weight shift to right;Trunk flexed;Wide base of support Gait velocity: slow   General Gait Details: cues for safe step pattern and proximity to RW. assist to steady with RW. no overt LOB noted but pt limited by pain in Rt knee.   Stairs         Wheelchair Mobility    Modified Rankin (Stroke Patients Only)       Balance Overall balance assessment: Needs assistance Sitting-balance support: Feet supported Sitting balance-Leahy Scale: Good     Standing balance support: Bilateral upper extremity supported;During functional activity Standing balance-Leahy Scale: Poor          Pertinent Vitals/Pain Pain Assessment: 0-10 Pain Score: 10-Worst pain ever Pain Location: Rt knee Pain Descriptors / Indicators: Aching Pain Intervention(s): Limited activity within patient's tolerance;Monitored during session;Repositioned    Home Living Family/patient expects to be discharged to:: Private residence Living Arrangements: Alone Available Help at Discharge: Friend(s) Type of Home: House Home Access: Stairs to enter Entrance Stairs-Rails: Can reach both Entrance Stairs-Number of Steps: 3 Home Layout: Two level;Full bath on main level;Able to live on main level with bedroom/bathroom Home Equipment: Gilford Rile - 2 wheels;Walker - 4 wheels;Cane - single point;Toilet riser;Grab bars - toilet;Grab bars - tub/shower;Shower seat(lift chair)  Prior Function Level of  Independence: Independent with assistive device(s)         Comments: pt is retired from education, has her Ph.D. in Education and used to be VP at [employer]. She h     Hand Dominance   Dominant Hand: Right    Extremity/Trunk Assessment   Upper Extremity Assessment Upper Extremity Assessment: Overall WFL for tasks assessed    Lower Extremity Assessment Lower Extremity Assessment: RLE deficits/detail RLE Deficits / Details: pt able to complete quad set but limited by pain, knee immobilizer for safety RLE: Unable to fully assess due to pain RLE Sensation: WNL;history of peripheral neuropathy RLE Coordination: WNL    Cervical / Trunk Assessment Cervical / Trunk Assessment: Lordotic  Communication   Communication: No difficulties  Cognition Arousal/Alertness: Awake/alert Behavior During Therapy: WFL for tasks assessed/performed Overall Cognitive Status: History of cognitive impairments - at baseline        General Comments: pt is being tested for memory impairments by her doctors. pt has difficulty with STM during session requiring repeted cues.Pt repeated statements and has trouble word finding.       General Comments      Exercises Total Joint Exercises Ankle Circles/Pumps: AROM;Both;20 reps;Seated   Assessment/Plan    PT Assessment Patient needs continued PT services  PT Problem List Decreased strength;Decreased range of motion;Decreased activity tolerance;Decreased balance;Decreased mobility;Decreased cognition;Decreased knowledge of use of DME;Decreased safety awareness;Decreased knowledge of precautions;Obesity       PT Treatment Interventions DME instruction;Gait training;Stair training;Functional mobility training;Therapeutic activities;Therapeutic exercise;Balance training;Patient/family education    PT Goals (Current goals can be found in the Care Plan section)  Acute Rehab PT Goals Patient Stated Goal: to get back to independence PT Goal  Formulation: With patient Time For Goal Achievement: 08/20/19 Potential to Achieve Goals: Good    Frequency 7X/week    AM-PAC PT "6 Clicks" Mobility  Outcome Measure Help needed turning from your back to your side while in a flat bed without using bedrails?: A Little Help needed moving from lying on your back to sitting on the side of a flat bed without using bedrails?: A Little Help needed moving to and from a bed to a chair (including a wheelchair)?: A Little Help needed standing up from a chair using your arms (e.g., wheelchair or bedside chair)?: A Little Help needed to walk in hospital room?: A Little Help needed climbing 3-5 steps with a railing? : A Lot 6 Click Score: 17    End of Session Equipment Utilized During Treatment: Gait belt;Right knee immobilizer Activity Tolerance: Patient limited by pain Patient left: in chair;with call bell/phone within reach;with chair alarm set Nurse Communication: Mobility status PT Visit Diagnosis: Muscle weakness (generalized) (M62.81);Difficulty in walking, not elsewhere classified (R26.2)    Time: 1803-1856(ou of room from [phone] (pt toileting)) PT Time Calculation (min) (ACUTE ONLY): 53 min   Charges:   PT Evaluation $PT Eval Low Complexity: 1 Low PT Treatments $Gait Training: 8-22 mins $Therapeutic Activity: 8-22 mins       Verner Mould, DPT Physical Therapist with [facility] [phone]  08/13/2019 7:38 PM

## 2019-08-13 NOTE — Transfer of Care (Signed)
Immediate Anesthesia Transfer of Care Note  Patient: Rillie Ode Merkin  Procedure(s) Performed: RIGHT TOTAL KNEE ARTHROPLASTY (Right Knee)  Patient Location: PACU  Anesthesia Type:General  Level of Consciousness: sedated, patient cooperative and responds to stimulation  Airway & Oxygen Therapy: Patient Spontanous Breathing and Patient connected to face mask oxygen  Post-op Assessment: Report given to RN and Post -op Vital signs reviewed and stable  Post vital signs: Reviewed and stable  Last Vitals:  Vitals Value Taken Time  BP    Temp    Pulse 73 08/13/19 1328  Resp 13 08/13/19 1328  SpO2 100 % 08/13/19 1328  Vitals shown include unvalidated device data.  Last Pain:  Vitals:   08/13/19 1050  TempSrc:   PainSc: 0-No pain         Complications: No apparent anesthesia complications

## 2019-08-13 NOTE — Progress Notes (Signed)
AssistedDr. Oddono with right, ultrasound guided, adductor canal block. Side rails up, monitors on throughout procedure. See vital signs in flow sheet. Tolerated Procedure well.  

## 2019-08-13 NOTE — Anesthesia Postprocedure Evaluation (Signed)
Anesthesia Post Note  Patient: [name]  Procedure(s) Performed: RIGHT TOTAL KNEE ARTHROPLASTY (Right Knee)     Patient location during evaluation: PACU Anesthesia Type: General Level of consciousness: awake and alert Pain management: pain level controlled Vital Signs Assessment: post-procedure vital signs reviewed and stable Respiratory status: spontaneous breathing, nonlabored ventilation, respiratory function stable and patient connected to nasal cannula oxygen Cardiovascular status: blood pressure returned to baseline and stable Postop Assessment: no apparent nausea or vomiting Anesthetic complications: no    Last Vitals:  Vitals:   08/13/19 1118 08/13/19 1330  BP:    Pulse: 73   Resp: 16   Temp:  (!) 36.4 C  SpO2: 99%     Last Pain:  Vitals:   08/13/19 1330  TempSrc:   PainSc: 10-Worst pain ever                 Riyanshi Wahab

## 2019-08-13 NOTE — H&P (Signed)
The patient understands that she is here today for a right total knee arthroplasty to treat the severe osteoarthritis of her right knee.  There has been no acute change in her medical status.  See current/recent H&P.  The risk and benefits of surgery have been explained and discussed in detail and informed consent is obtained.

## 2019-08-13 NOTE — Anesthesia Preprocedure Evaluation (Signed)
Anesthesia Evaluation  Patient identified by MRN, date of birth, ID band Patient awake    Reviewed: Allergy & Precautions, NPO status , Patient's Chart, lab work & pertinent test results  History of Anesthesia Complications (+) history of anesthetic complications  Airway Mallampati: I  TM Distance: >3 FB Neck ROM: Full    Dental  (+) Teeth Intact, Dental Advisory Given   Pulmonary asthma , sleep apnea , former smoker,    breath sounds clear to auscultation       Cardiovascular hypertension, Pt. on medications  Rhythm:Regular Rate:Normal     Neuro/Psych Anxiety  Neuromuscular disease    GI/Hepatic Neg liver ROS, GERD  ,  Endo/Other  diabetesHypothyroidism   Renal/GU      Musculoskeletal  (+) Arthritis , Fibromyalgia -  Abdominal Normal abdominal exam  (+)   Peds  Hematology  (+) Blood dyscrasia, Sickle cell trait ,   Anesthesia Other Findings   Reproductive/Obstetrics                             Lab Results  Component Value Date   WBC 6.6 08/10/2019   HGB 13.6 08/10/2019   HCT 42.6 08/10/2019   MCV 86.1 08/10/2019   PLT 210 08/10/2019   Lab Results  Component Value Date   CREATININE 0.98 08/10/2019   BUN 21 08/10/2019   NA 142 08/10/2019   K 4.1 08/10/2019   CL 106 08/10/2019   CO2 27 08/10/2019   Lab Results  Component Value Date   INR 0.98 06/13/2018   INR 1.00 11/20/2010   EKG: normal sinus rhythm.  Anesthesia Physical  Anesthesia Plan  ASA: III  Anesthesia Plan: General   Post-op Pain Management:    Induction: Intravenous  PONV Risk Score and Plan: 4 or greater and Ondansetron, Dexamethasone, Midazolam and Treatment may vary due to age or medical condition  Airway Management Planned: LMA  Additional Equipment: None  Intra-op Plan:   Post-operative Plan: Extubation in OR  Informed Consent: I have reviewed the patients History and Physical, chart, labs  and discussed the procedure including the risks, benefits and alternatives for the proposed anesthesia with the patient or authorized representative who has indicated his/her understanding and acceptance.       Plan Discussed with: CRNA and Anesthesiologist  Anesthesia Plan Comments: (  )        Anesthesia Quick Evaluation

## 2019-08-13 NOTE — Care Plan (Signed)
Ortho Bundle Case Management Note  Patient Details  Name: [name] MRN: [id number] Date of Birth: 1941-03-26   [facility] contacted patient for pre-op call to discuss her upcoming Right TKA with Dr. Ninfa Linden on 08/13/19. Patient will be an Ortho bundle patient through [facility]. Reviewed all information related to this and patient in agreement for case management. She will have someone in her home (paid CG) that will be coming to assist after surgery. She requested [facility] for HHPT after short hospital stay as she has been active with them for therapy in the past. She has all needed DME. She also requested OPPT be scheduled with [place] location. CM will continue to follow for all CM needs.                DME Arranged:  (No DME needed; patient reports having FWW and 3in1/BSC) DME Agency:     HH Arranged:  PT HH Agency:  [facility]  Additional Comments: Please contact me with any questions of if this plan should need to change.  Jamse Arn, RN, BSN, [employer]  [phone] 08/13/2019, 9:30 AM

## 2019-08-13 NOTE — Anesthesia Procedure Notes (Signed)
Procedure Name: LMA Insertion Performed by: Nevyn Bossman J, CRNA Pre-anesthesia Checklist: Patient identified, Emergency Drugs available, Suction available, Patient being monitored and Timeout performed Patient Re-evaluated:Patient Re-evaluated prior to induction Oxygen Delivery Method: Circle system utilized Preoxygenation: Pre-oxygenation with 100% oxygen Induction Type: IV induction Ventilation: Mask ventilation without difficulty LMA: LMA inserted LMA Size: 4.0 Number of attempts: 1 Placement Confirmation: positive ETCO2 and breath sounds checked- equal and bilateral Tube secured with: Tape Dental Injury: Teeth and Oropharynx as per pre-operative assessment        

## 2019-08-13 NOTE — Brief Op Note (Signed)
08/13/2019  12:52 PM  PATIENT:  [name]  79 y.o. female  PRE-OPERATIVE DIAGNOSIS:  osteoarthritis right knee  POST-OPERATIVE DIAGNOSIS:  osteoarthritis right knee  PROCEDURE:  Procedure(s): RIGHT TOTAL KNEE ARTHROPLASTY (Right)  SURGEON:  Surgeon(s) and Role:    [name], MD - Primary  PHYSICIAN ASSISTANT:  Benita Stabile, PA-C  ANESTHESIA:   local, regional and general  EBL:  25 mL   COUNTS:  YES  TOURNIQUET:   Total Tourniquet Time Documented: Thigh (Right) - 46 minutes Total: Thigh (Right) - 46 minutes   DICTATION: .Other Dictation: Dictation Number [id number]  PLAN OF CARE: Admit for overnight observation  PATIENT DISPOSITION:  PACU - hemodynamically stable.   Delay start of Pharmacological VTE agent (>24hrs) due to surgical blood loss or risk of bleeding: no

## 2019-08-13 NOTE — Anesthesia Procedure Notes (Signed)
Anesthesia Regional Block: Adductor canal block   Pre-Anesthetic Checklist: ,, timeout performed, Correct Patient, Correct Site, Correct Laterality, Correct Procedure, Correct Position, site marked, Risks and benefits discussed,  Surgical consent,  Pre-op evaluation,  At surgeon's request and post-op pain management  Laterality: Right  Prep: chloraprep       Needles:  Injection technique: Single-shot  Needle Type: Echogenic Stimulator Needle     Needle Length: 5cm  Needle Gauge: 22     Additional Needles:   Procedures:, nerve stimulator,,, ultrasound used (permanent image in chart),,,,  Narrative:  Start time: 08/13/2019 10:43 AM End time: 08/13/2019 10:49 AM Injection made incrementally with aspirations every 5 mL.  Performed by: Personally  Anesthesiologist: Janeece Riggers, MD  Additional Notes: Functioning IV was confirmed and monitors were applied.  A 59mm 22ga Arrow echogenic stimulator needle was used. Sterile prep and drape,hand hygiene and sterile gloves were used. Ultrasound guidance: relevant anatomy identified, needle position confirmed, local anesthetic spread visualized around nerve(s)., vascular puncture avoided.  Image printed for medical record. Negative aspiration and negative test dose prior to incremental administration of local anesthetic. The patient tolerated the procedure well.

## 2019-08-14 DIAGNOSIS — F419 Anxiety disorder, unspecified: Secondary | ICD-10-CM | POA: Diagnosis not present

## 2019-08-14 DIAGNOSIS — E1142 Type 2 diabetes mellitus with diabetic polyneuropathy: Secondary | ICD-10-CM | POA: Diagnosis not present

## 2019-08-14 DIAGNOSIS — Z87891 Personal history of nicotine dependence: Secondary | ICD-10-CM | POA: Diagnosis not present

## 2019-08-14 DIAGNOSIS — Z794 Long term (current) use of insulin: Secondary | ICD-10-CM | POA: Diagnosis not present

## 2019-08-14 DIAGNOSIS — G4730 Sleep apnea, unspecified: Secondary | ICD-10-CM | POA: Diagnosis not present

## 2019-08-14 DIAGNOSIS — J45909 Unspecified asthma, uncomplicated: Secondary | ICD-10-CM | POA: Diagnosis not present

## 2019-08-14 DIAGNOSIS — M1711 Unilateral primary osteoarthritis, right knee: Secondary | ICD-10-CM | POA: Diagnosis not present

## 2019-08-14 DIAGNOSIS — G253 Myoclonus: Secondary | ICD-10-CM | POA: Diagnosis not present

## 2019-08-14 DIAGNOSIS — I10 Essential (primary) hypertension: Secondary | ICD-10-CM | POA: Diagnosis not present

## 2019-08-14 DIAGNOSIS — Z96651 Presence of right artificial knee joint: Secondary | ICD-10-CM | POA: Diagnosis not present

## 2019-08-14 LAB — CBC
HCT: 37.3 % (ref 36.0–46.0)
Hemoglobin: 11.8 g/dL — ABNORMAL LOW (ref 12.0–15.0)
MCH: 27.5 pg (ref 26.0–34.0)
MCHC: 31.6 g/dL (ref 30.0–36.0)
MCV: 86.9 fL (ref 80.0–100.0)
Platelets: 179 10*3/uL (ref 150–400)
RBC: 4.29 MIL/uL (ref 3.87–5.11)
RDW: 14 % (ref 11.5–15.5)
WBC: 9.7 10*3/uL (ref 4.0–10.5)
nRBC: 0 % (ref 0.0–0.2)

## 2019-08-14 LAB — GLUCOSE, CAPILLARY
Glucose-Capillary: 103 mg/dL — ABNORMAL HIGH (ref 70–99)
Glucose-Capillary: 95 mg/dL (ref 70–99)

## 2019-08-14 LAB — BASIC METABOLIC PANEL
Anion gap: 10 (ref 5–15)
BUN: 20 mg/dL (ref 8–23)
CO2: 23 mmol/L (ref 22–32)
Calcium: 8.8 mg/dL — ABNORMAL LOW (ref 8.9–10.3)
Chloride: 107 mmol/L (ref 98–111)
Creatinine, Ser: 1 mg/dL (ref 0.44–1.00)
GFR calc Af Amer: >60 mL/min (ref >60)
GFR calc non Af Amer: 54 mL/min — ABNORMAL LOW (ref >60)
Glucose, Bld: 172 mg/dL — ABNORMAL HIGH (ref 70–99)
Potassium: 4.2 mmol/L (ref 3.5–5.1)
Sodium: 140 mmol/L (ref 135–145)

## 2019-08-14 MED ORDER — ASPIRIN 81 MG PO CHEW: 81.0000 mg | CHEWABLE_TABLET | Freq: Two times a day (BID) | ORAL | 0 refills | Status: DC

## 2019-08-14 MED ORDER — HYDROCODONE-ACETAMINOPHEN 5-325 MG PO TABS: 1.0000 | ORAL_TABLET | Freq: Four times a day (QID) | ORAL | 0 refills | Status: DC | PRN

## 2019-08-14 MED ORDER — METHOCARBAMOL 500 MG PO TABS: 500.0000 mg | ORAL_TABLET | Freq: Four times a day (QID) | ORAL | 0 refills | Status: DC | PRN

## 2019-08-14 NOTE — Progress Notes (Signed)
Physical Therapy Treatment Patient Details Name: [name] MRN: [id number] DOB: 1940-06-25 Today's Date: 08/14/2019    History of Present Illness 79  y.o. female s/p Rt TKA on 08/13/19 with PMH significant for DM, Osteoporosis, spondylolithesis, lumbar radiculopathy, hypothyroidis, HTN, GERD, fibromyalgia, neuropathy, OA, asthma, anxiety, myoclonic jerking, memory disorder, Lumbar fusion T10-L2, Lt IM nail in hip.    PT Comments    Progressing with mobility. Reviewed gait training and stair training. Pt desires to d/c home on today. All education completed.    Follow Up Recommendations  Follow surgeon's recommendation for DC plan and follow-up therapies;Supervision for mobility/OOB     Equipment Recommendations  None recommended by PT    Recommendations for Other Services       Precautions / Restrictions Precautions Precautions: Fall Required Braces or Orthoses: Knee Immobilizer - Right Knee Immobilizer - Right: Discontinue once straight leg raise with < 10 degree lag Restrictions Weight Bearing Restrictions: No RLE Weight Bearing: Weight bearing as tolerated    Mobility  Bed Mobility               General bed mobility comments: oob in recliner  Transfers Overall transfer level: Needs assistance Equipment used: Rolling walker (2 wheeled) Transfers: Sit to/from Stand Sit to Stand: Min guard         General transfer comment: VCs safety, technique, hand/LE placement. Increased time.  Ambulation/Gait Ambulation/Gait assistance: Min guard Gait Distance (Feet): 70 Feet Assistive device: Rolling walker (2 wheeled) Gait Pattern/deviations: Step-to pattern;Step-through pattern;Decreased stride length;Decreased step length - right;Decreased step length - left     General Gait Details: Slow gait speed. Cues for safety, proximity to RW. Min guard for safety.   Stairs Stairs: Yes Stairs assistance: Min guard Stair Management: Step to pattern;Forwards;Two  rails Number of Stairs: 2 General stair comments: VCs safety, technique, sequence. Close guard for safety.   Wheelchair Mobility    Modified Rankin (Stroke Patients Only)       Balance Overall balance assessment: Needs assistance         Standing balance support: Bilateral upper extremity supported Standing balance-Leahy Scale: Poor                              Cognition Arousal/Alertness: Awake/alert Behavior During Therapy: WFL for tasks assessed/performed Overall Cognitive Status: Within Functional Limits for tasks assessed                                        Exercises Total Joint Exercises Ankle Circles/Pumps: AROM;Both;10 reps Quad Sets: AROM;Both;10 reps Heel Slides: AAROM;10 reps Hip ABduction/ADduction: AAROM;Right;10 reps Straight Leg Raises: AAROM;Right;10 reps Goniometric ROM: ~10-65 degrees    General Comments        Pertinent Vitals/Pain Pain Assessment: 0-10 Pain Score: 6  Pain Location: R knee Pain Descriptors / Indicators: Discomfort;Sore;Aching Pain Intervention(s): Limited activity within patient's tolerance;Monitored during session;Repositioned    Home Living                      Prior Function            PT Goals (current goals can now be found in the care plan section) Progress towards PT goals: Progressing toward goals    Frequency    7X/week      PT Plan Current plan remains appropriate  Co-evaluation              AM-PAC PT "6 Clicks" Mobility   Outcome Measure  Help needed turning from your back to your side while in a flat bed without using bedrails?: A Little Help needed moving from lying on your back to sitting on the side of a flat bed without using bedrails?: A Little Help needed moving to and from a bed to a chair (including a wheelchair)?: A Little Help needed standing up from a chair using your arms (e.g., wheelchair or bedside chair)?: A Little Help needed to  walk in hospital room?: A Little Help needed climbing 3-5 steps with a railing? : A Little 6 Click Score: 18    End of Session Equipment Utilized During Treatment: Gait belt;Right knee immobilizer Activity Tolerance: Patient tolerated treatment well Patient left: in chair;with call bell/phone within reach   PT Visit Diagnosis: Pain;Other abnormalities of gait and mobility (R26.89) Pain - Right/Left: Right Pain - part of body: Knee     Time: [id number] PT Time Calculation (min) (ACUTE ONLY): 24 min  Charges:  $Gait Training: 23-37 mins $Therapeutic Exercise: 8-22 mins                         Doreatha Massed, PT Acute Rehabilitation

## 2019-08-14 NOTE — Discharge Instructions (Signed)

## 2019-08-14 NOTE — Progress Notes (Signed)
Subjective: 1 Day Post-Op Procedure(s) (LRB): RIGHT TOTAL KNEE ARTHROPLASTY (Right) Patient reports pain as moderate.    Objective: Vital signs in last 24 hours: Temp:  [97.4 F (36.3 C)-98.4 F (36.9 C)] 98.2 F (36.8 C) (04/24 1356) Pulse Rate:  [63-80] 63 (04/24 1356) Resp:  [14-18] 17 (04/24 1356) BP: (111-158)/(57-96) 127/67 (04/24 1356) SpO2:  [94 %-100 %] 100 % (04/24 1356)  Intake/Output from previous day: 04/23 0701 - 04/24 0700 In: 2975.9 [P.O.:360; I.V.:2287.4; IV Piggyback:328.5] Out: 2350 [Urine:2325; Blood:25] Intake/Output this shift: Total I/O In: 792.8 [P.O.:120; I.V.:672.8] Out: 100 [Urine:100]  Recent Labs    08/14/19 0325  HGB 11.8*   Recent Labs    08/14/19 0325  WBC 9.7  RBC 4.29  HCT 37.3  PLT 179   Recent Labs    08/14/19 0325  NA 140  K 4.2  CL 107  CO2 23  BUN 20  CREATININE 1.00  GLUCOSE 172*  CALCIUM 8.8*   No results for input(s): LABPT, INR in the last 72 hours.  Sensation intact distally Intact pulses distally Dorsiflexion/Plantar flexion intact Incision: dressing C/D/I No cellulitis present Compartment soft   Assessment/Plan: 1 Day Post-Op Procedure(s) (LRB): RIGHT TOTAL KNEE ARTHROPLASTY (Right) Up with therapy Discharge home with home health    Patient's anticipated LOS is less than 2 midnights, meeting these requirements: - Younger than 73 - Lives within 1 hour of care - Has a competent adult at home to recover with post-op recover - NO history of  - Chronic pain requiring opiods  - Diabetes  - Coronary Artery Disease  - Heart failure  - Heart attack  - Stroke  - DVT/VTE  - Cardiac arrhythmia  - Respiratory Failure/COPD  - Renal failure  - Anemia  - Advanced Liver disease       [name] 08/14/2019, 2:20 PM

## 2019-08-14 NOTE — Progress Notes (Signed)
Physical Therapy Treatment Patient Details Name: [name] MRN: [id number] DOB: 01-Sep-1940 Today's Date: 08/14/2019    History of Present Illness 79  y.o. female s/p Rt TKA on 08/13/19 with PMH significant for DM, Osteoporosis, spondylolithesis, lumbar radiculopathy, hypothyroidis, HTN, GERD, fibromyalgia, neuropathy, OA, asthma, anxiety, myoclonic jerking, memory disorder, Lumbar fusion T10-L2, Lt IM nail in hip.    PT Comments    Progressing with mobility. Pt expresses a desire to d/c home on today. There is some concern since she will only have intermittent supervision/assist at home (she lives alone). Will plan to have a 2nd session later to continue gait and stair training.     Follow Up Recommendations  Follow surgeon's recommendation for DC plan and follow-up therapies(per pt, she will only have intermittent supervision/assist at home)     Equipment Recommendations  None recommended by PT    Recommendations for Other Services       Precautions / Restrictions Precautions Precautions: Fall Required Braces or Orthoses: Knee Immobilizer - Right Knee Immobilizer - Right: Discontinue once straight leg raise with < 10 degree lag Restrictions Weight Bearing Restrictions: No RLE Weight Bearing: Weight bearing as tolerated    Mobility  Bed Mobility               General bed mobility comments: oob with NT  Transfers Overall transfer level: Needs assistance Equipment used: Rolling walker (2 wheeled) Transfers: Sit to/from Stand Sit to Stand: Min assist         General transfer comment: VCs safety, technique, hand/LE placement. Increased time.  Ambulation/Gait Ambulation/Gait assistance: Min assist Gait Distance (Feet): 70 Feet Assistive device: Rolling walker (2 wheeled) Gait Pattern/deviations: Step-to pattern;Step-through pattern;Decreased stride length;Decreased step length - right;Decreased step length - left     General Gait Details: Slow gait speed.  Cues for safety, proximity to RW. Intermittent assist to steady but no overt LOB   Stairs             Wheelchair Mobility    Modified Rankin (Stroke Patients Only)       Balance Overall balance assessment: Needs assistance         Standing balance support: Bilateral upper extremity supported Standing balance-Leahy Scale: Poor                              Cognition Arousal/Alertness: Awake/alert Behavior During Therapy: WFL for tasks assessed/performed Overall Cognitive Status: Within Functional Limits for tasks assessed                                        Exercises Total Joint Exercises Ankle Circles/Pumps: AROM;Both;10 reps Quad Sets: AROM;Both;10 reps Heel Slides: AAROM;10 reps Hip ABduction/ADduction: AAROM;Right;10 reps Straight Leg Raises: AAROM;Right;10 reps Goniometric ROM: ~10-65 degrees    General Comments        Pertinent Vitals/Pain Pain Assessment: 0-10 Pain Score: 7  Pain Location: R knee Pain Descriptors / Indicators: Discomfort;Sore;Aching Pain Intervention(s): Limited activity within patient's tolerance;Monitored during session;Ice applied;Repositioned    Home Living                      Prior Function            PT Goals (current goals can now be found in the care plan section) Progress towards PT goals: Progressing toward goals  Frequency    7X/week      PT Plan Current plan remains appropriate    Co-evaluation              AM-PAC PT "6 Clicks" Mobility   Outcome Measure  Help needed turning from your back to your side while in a flat bed without using bedrails?: A Little Help needed moving from lying on your back to sitting on the side of a flat bed without using bedrails?: A Little Help needed moving to and from a bed to a chair (including a wheelchair)?: A Little Help needed standing up from a chair using your arms (e.g., wheelchair or bedside chair)?: A Little Help  needed to walk in hospital room?: A Little Help needed climbing 3-5 steps with a railing? : A Lot 6 Click Score: 17    End of Session Equipment Utilized During Treatment: Gait belt;Right knee immobilizer Activity Tolerance: Patient tolerated treatment well Patient left: in chair;with call bell/phone within reach;with chair alarm set   PT Visit Diagnosis: Pain;Other abnormalities of gait and mobility (R26.89) Pain - Right/Left: Right Pain - part of body: Knee     Time: [id number] PT Time Calculation (min) (ACUTE ONLY): 45 min  Charges:  $Gait Training: 23-37 mins $Therapeutic Exercise: 8-22 mins                         Doreatha Massed, PT Acute Rehabilitation

## 2019-08-14 NOTE — TOC Progression Note (Signed)
Transition of Care [facility]) - Progression Note    Patient Details  Name: [name] MRN: [id number] Date of Birth: [birth year]  Transition [facility]) CM/SW Contact  Joaquin Courts, RN Phone Number: 08/14/2019, 11:06 AM  Clinical Narrative:   [facility] is unable to service patient at this time, patient has been [facility] with [facility] for [place]. [employer] to deliver 3in1 to bedside, patient has rolling walker at home.    Expected Discharge Plan: [place] Barriers to Discharge: No Barriers Identified  Expected Discharge Plan and Services Expected Discharge Plan: [place]   Discharge Planning Services: CM Consult Post Acute Care Choice: [place] arrangements for the past 2 months: Single Family Home                 DME Arranged: 3-N-1 DME Agency: [facility] Date DME Agency Contacted: 08/14/19 Time DME Agency Contacted: 2 Representative spoke with at DME Agency: [place]: PT [place]: [place] ([place])         Social Determinants of Health (SDOH) Interventions    Readmission Risk Interventions No flowsheet data found.

## 2019-08-14 NOTE — Discharge Summary (Signed)
Patient ID: [name] MRN: [id number] DOB/AGE: 05/10/1940 79 y.o.  Admit date: 08/13/2019 Discharge date: 08/14/2019  Admission Diagnoses:  Principal Problem:   Unilateral primary osteoarthritis, right knee Active Problems:   Status post total right knee replacement   Discharge Diagnoses:  Same  Past Medical History:  Diagnosis Date  . Acute blood loss anemia   . Acute encephalopathy 07/15/2016  . AKI (acute kidney injury) ([place])   . Anemia    has sickle cell trait  . Anxiety   . Arthritis   . Asthma    has used inhaler in past for asthmatic bronchitis, last time- early 2012  . Bilateral primary osteoarthritis of knee 03/26/2016  . Complication of anesthesia    wakes up shaking  . Diabetes mellitus   . Diabetes mellitus with neuropathy ([place]) 10/29/2012  . Diverticulitis   . Dyslipidemia   . Encephalopathy 06/2016   due to medications after surgery  . Fibromyalgia   . GERD (gastroesophageal reflux disease)    occas. use of  Prilosec  . Heart murmur    sees Dr. Montez Morita, last seen- early 2012  . Herniated nucleus pulposus, L2-3 06/25/2016  . History of back surgery   . Hypertension    02/2010- stress test /w PCP  . Hypothyroidism   . Loose bowel movements 12/2016  . Lumbar radiculopathy 06/27/2016  . Lumbar stenosis with neurogenic claudication 03/17/2017  . Memory disorder 02/16/2016  . Myoclonic jerking   . Neuromuscular disorder ([facility])    lumbar radiculopathy, lumbago  . Nocturnal leg cramps 09/27/2014  . Osteoporosis 03/10/2014  . Pneumonia   . Sickle cell trait ([place])   . Sleep apnea    borderline sleep apnea, states she no longer uses, early 2012- stopped using   . Spinal stenosis of lumbar region with radiculopathy 02/28/2014  . Spondylolisthesis of lumbar region 03/19/2011  . Type II or unspecified type diabetes mellitus without mention of complication, uncontrolled 07/08/2013    Surgeries: Procedure(s): RIGHT TOTAL KNEE ARTHROPLASTY on 08/13/2019    Consultants:   Discharged Condition: Improved  Hospital Course: [name] is an 79 y.o. female who was admitted 08/13/2019 for operative treatment ofUnilateral primary osteoarthritis, right knee. Patient has severe unremitting pain that affects sleep, daily activities, and work/hobbies. After pre-op clearance the patient was taken to the operating room on 08/13/2019 and underwent  Procedure(s): RIGHT TOTAL KNEE ARTHROPLASTY.    Patient was given perioperative antibiotics:  Anti-infectives (From admission, onward)   Start     Dose/Rate Route Frequency Ordered Stop   08/13/19 1800  ceFAZolin (ANCEF) IVPB 1 g/50 mL premix     1 g 100 mL/hr over 30 Minutes Intravenous Every 6 hours 08/13/19 1435 08/14/19 0048   08/13/19 0845  ceFAZolin (ANCEF) IVPB 2g/100 mL premix     2 g 200 mL/hr over 30 Minutes Intravenous On call to O.R. 08/13/19 0843 08/13/19 1209       Patient was given sequential compression devices, early ambulation, and chemoprophylaxis to prevent DVT.  Patient benefited maximally from hospital stay and there were no complications.    Recent vital signs:  Patient Vitals for the past 24 hrs:  BP Temp Temp src Pulse Resp SpO2  08/14/19 1356 127/67 98.2 F (36.8 C) Oral 63 17 100 %  08/14/19 0948 (!) 111/57 97.8 F (36.6 C) Oral 80 17 96 %  08/14/19 0544 (!) 153/77 98.4 F (36.9 C) Oral 66 16 94 %  08/14/19 0145 128/73 98.4 F (36.9 C)  Oral 78 16 95 %  08/13/19 2120 137/79 (!) 97.4 F (36.3 C) Oral 73 16 95 %  08/13/19 1744 (!) 141/83 98 F (36.7 C) Oral 65 16 100 %  08/13/19 1704 139/77 97.6 F (36.4 C) Oral 72 16 98 %  08/13/19 1551 (!) 158/82 97.7 F (36.5 C) Oral 66 14 100 %  08/13/19 1440 (!) 158/96 97.6 F (36.4 C) Oral 73 18 95 %     Recent laboratory studies:  Recent Labs    08/14/19 0325  WBC 9.7  HGB 11.8*  HCT 37.3  PLT 179  NA 140  K 4.2  CL 107  CO2 23  BUN 20  CREATININE 1.00  GLUCOSE 172*  CALCIUM 8.8*     Discharge  Medications:   Allergies as of 08/14/2019      Reactions   Cymbalta [duloxetine Hcl] Diarrhea, Other (See Comments)   Dizziness, headache, irritability   Baclofen    jerks    Betadine [povidone Iodine] Swelling, Other (See Comments)   SWELLING REACTION DESCRIPTION/SEVERITY UNSPECIFIED  Reaction to betadine eye drops   Percocet [oxycodone-acetaminophen]    Confusion   Adhesive [tape] Rash   Lipitor [atorvastatin] Rash      Medication List    STOP taking these medications   aspirin EC 81 MG tablet Replaced by: aspirin 81 MG chewable tablet     TAKE these medications   Accu-Chek Aviva Plus w/Device Kit Use Accu Chek Aviva as instructed to check blood sugar 4 times daily.   acetaminophen 500 MG tablet Commonly known as: TYLENOL Take 1,000 mg by mouth every 8 (eight) hours as needed for mild pain or moderate pain.   aspirin 81 MG chewable tablet Chew 1 tablet (81 mg total) by mouth 2 (two) times daily. Replaces: aspirin EC 81 MG tablet   BD Pen Needle Nano U/F 32G X 4 MM Misc Generic drug: Insulin Pen Needle Use to inject insulin 4 times daily.   Bystolic 5 MG tablet Generic drug: nebivolol Take 5 mg by mouth daily.   cetirizine 10 MG tablet Commonly known as: ZYRTEC Take 1 tablet (10 mg total) by mouth daily as needed for allergies.   Farxiga 5 MG Tabs tablet Generic drug: dapagliflozin propanediol Take 1 tablet by mouth once daily. DX:E11.65 What changed:   how much to take  how to take this  when to take this   fluticasone 50 MCG/ACT nasal spray Commonly known as: FLONASE Place 1 spray into both nostrils daily as needed for allergies. What changed:   how much to take  when to take this   gabapentin 600 MG tablet Commonly known as: NEURONTIN TAKE 1 TABLET(600 MG) BY MOUTH THREE TIMES DAILY What changed: See the new instructions.   glucose blood test strip Use Accu Chek Aviva test strips as instructed to check blood sugar 4 times daily.    HYDROcodone-acetaminophen 5-325 MG tablet Commonly known as: Norco Take 1-2 tablets by mouth every 6 (six) hours as needed for moderate pain.   ibuprofen 800 MG tablet Commonly known as: ADVIL Take 1 tablet (800 mg total) by mouth every 8 (eight) hours as needed. What changed: reasons to take this   irbesartan 300 MG tablet Commonly known as: AVAPRO Take 300 mg by mouth daily.   ketoconazole 2 % cream Commonly known as: NIZORAL Apply 1 application topically See admin instructions. For 14 days for irritation when needed   levothyroxine 50 MCG tablet Commonly known as: SYNTHROID TAKE 1 TABLET(50  MCG) BY MOUTH DAILY What changed: See the new instructions.   MAGNESIUM PO Take 250 mg by mouth 2 (two) times daily.   meloxicam 15 MG tablet Commonly known as: MOBIC Take 15 mg by mouth daily.   methocarbamol 500 MG tablet Commonly known as: ROBAXIN Take 1 tablet (500 mg total) by mouth every 6 (six) hours as needed for muscle spasms.   NovoLOG FlexPen 100 UNIT/ML FlexPen Generic drug: insulin aspart Inject 6 units under the skin before breakfast, 9 units at lunch, and 8-10 units at dinner. 3-4 PRN for large carbs or ice cream. DX:E11.65 What changed:   how much to take  how to take this  when to take this   [place] Take 1 tablet by mouth daily.   multivitamin with minerals tablet Take 1 tablet by mouth daily.   potassium chloride 10 MEQ tablet Commonly known as: KLOR-CON Take 10 mEq by mouth daily as needed (low potassium).   Symbicort 80-4.5 MCG/ACT inhaler Generic drug: budesonide-formoterol Inhale 2 puffs into the lungs 2 (two) times daily as needed (shortness of breath).   Tyler Aas FlexTouch 100 UNIT/ML FlexTouch Pen Generic drug: insulin degludec INJECT 22 UNITS UNDER THE SKIN EVERY DAY. DX:E11.65 What changed:   how much to take  how to take this  when to take this  additional instructions   zinc gluconate 50 MG tablet Take 50 mg  by mouth daily.            Durable Medical Equipment  (From admission, onward)         Start     Ordered   08/13/19 1435  DME Walker rolling  Once    Question Answer Comment  Walker: With 5 Inch Wheels   Patient needs a walker to treat with the following condition Status post total right knee replacement      08/13/19 1435   08/13/19 1435  DME 3 n 1  Once     08/13/19 1435          Diagnostic Studies: DG Knee Right Port  Result Date: 08/13/2019 CLINICAL DATA:  20 73-year-old female status post right knee replacement. EXAM: PORTABLE RIGHT KNEE - 1-2 VIEW COMPARISON:  Knee radiograph dated 05/24/2019. FINDINGS: There is a total right knee arthroplasty. The arthroplasty components appear intact and in anatomic alignment. No evidence of loosening. There is no acute fracture or dislocation. The bones are osteopenic. Postsurgical changes with air and fluid within the joint space. IMPRESSION: Total right knee arthroplasty. No evidence of hardware complication. Electronically Signed   By: Anner Crete M.D.   On: 08/13/2019 15:46    Disposition: Discharge disposition: 01-Home or Self Care         Follow-up Information    [name], MD. Go on 08/26/2019.   Specialty: Orthopedic Surgery Why: at 1:30 pm for your 2 week post-op with Dr. Ninfa Linden in office. Contact information: [place] [address] [phone]        [facility], [facility] Follow up.   Specialty: Home Health Services Why: agency will provide home health physical therapy, agency will call you to schedule first visit. Contact information: [address] [place] [address] [phone]            Signed: [name] 08/14/2019, 2:22 PM

## 2019-08-14 NOTE — Progress Notes (Signed)
Patient discharged to home w/ family. Given all belongings, instructions, equipment. Verbalized understanding of all instructions. Escorted to pov via w/c. 

## 2019-08-15 DIAGNOSIS — E1142 Type 2 diabetes mellitus with diabetic polyneuropathy: Secondary | ICD-10-CM | POA: Diagnosis not present

## 2019-08-15 DIAGNOSIS — M5116 Intervertebral disc disorders with radiculopathy, lumbar region: Secondary | ICD-10-CM | POA: Diagnosis not present

## 2019-08-15 DIAGNOSIS — M1712 Unilateral primary osteoarthritis, left knee: Secondary | ICD-10-CM | POA: Diagnosis not present

## 2019-08-15 DIAGNOSIS — M4316 Spondylolisthesis, lumbar region: Secondary | ICD-10-CM | POA: Diagnosis not present

## 2019-08-15 DIAGNOSIS — Z471 Aftercare following joint replacement surgery: Secondary | ICD-10-CM | POA: Diagnosis not present

## 2019-08-15 DIAGNOSIS — M797 Fibromyalgia: Secondary | ICD-10-CM | POA: Diagnosis not present

## 2019-08-15 DIAGNOSIS — M19049 Primary osteoarthritis, unspecified hand: Secondary | ICD-10-CM | POA: Diagnosis not present

## 2019-08-15 DIAGNOSIS — M48062 Spinal stenosis, lumbar region with neurogenic claudication: Secondary | ICD-10-CM | POA: Diagnosis not present

## 2019-08-15 DIAGNOSIS — M810 Age-related osteoporosis without current pathological fracture: Secondary | ICD-10-CM | POA: Diagnosis not present

## 2019-08-16 ENCOUNTER — Telehealth: Payer: Self-pay | Admitting: *Deleted

## 2019-08-16 ENCOUNTER — Encounter: Payer: Self-pay | Admitting: *Deleted

## 2019-08-16 NOTE — Telephone Encounter (Signed)
Attempted Ortho bundle D/C call. Left VM and requested call back. Spoke with therapy regarding start of care over the weekend.

## 2019-08-16 NOTE — Telephone Encounter (Signed)
Ortho bundle D/C call completed. 

## 2019-08-17 DIAGNOSIS — M810 Age-related osteoporosis without current pathological fracture: Secondary | ICD-10-CM | POA: Diagnosis not present

## 2019-08-17 DIAGNOSIS — M48062 Spinal stenosis, lumbar region with neurogenic claudication: Secondary | ICD-10-CM | POA: Diagnosis not present

## 2019-08-17 DIAGNOSIS — M797 Fibromyalgia: Secondary | ICD-10-CM | POA: Diagnosis not present

## 2019-08-17 DIAGNOSIS — M4316 Spondylolisthesis, lumbar region: Secondary | ICD-10-CM | POA: Diagnosis not present

## 2019-08-17 DIAGNOSIS — M19049 Primary osteoarthritis, unspecified hand: Secondary | ICD-10-CM | POA: Diagnosis not present

## 2019-08-17 DIAGNOSIS — M5116 Intervertebral disc disorders with radiculopathy, lumbar region: Secondary | ICD-10-CM | POA: Diagnosis not present

## 2019-08-17 DIAGNOSIS — Z471 Aftercare following joint replacement surgery: Secondary | ICD-10-CM | POA: Diagnosis not present

## 2019-08-17 DIAGNOSIS — M1712 Unilateral primary osteoarthritis, left knee: Secondary | ICD-10-CM | POA: Diagnosis not present

## 2019-08-17 DIAGNOSIS — E1142 Type 2 diabetes mellitus with diabetic polyneuropathy: Secondary | ICD-10-CM | POA: Diagnosis not present

## 2019-08-19 ENCOUNTER — Inpatient Hospital Stay: Payer: Medicare PPO | Admitting: Orthopaedic Surgery

## 2019-08-19 DIAGNOSIS — M4316 Spondylolisthesis, lumbar region: Secondary | ICD-10-CM | POA: Diagnosis not present

## 2019-08-19 DIAGNOSIS — M797 Fibromyalgia: Secondary | ICD-10-CM | POA: Diagnosis not present

## 2019-08-19 DIAGNOSIS — Z471 Aftercare following joint replacement surgery: Secondary | ICD-10-CM | POA: Diagnosis not present

## 2019-08-19 DIAGNOSIS — M19049 Primary osteoarthritis, unspecified hand: Secondary | ICD-10-CM | POA: Diagnosis not present

## 2019-08-19 DIAGNOSIS — M48062 Spinal stenosis, lumbar region with neurogenic claudication: Secondary | ICD-10-CM | POA: Diagnosis not present

## 2019-08-19 DIAGNOSIS — M810 Age-related osteoporosis without current pathological fracture: Secondary | ICD-10-CM | POA: Diagnosis not present

## 2019-08-19 DIAGNOSIS — M5116 Intervertebral disc disorders with radiculopathy, lumbar region: Secondary | ICD-10-CM | POA: Diagnosis not present

## 2019-08-19 DIAGNOSIS — E1142 Type 2 diabetes mellitus with diabetic polyneuropathy: Secondary | ICD-10-CM | POA: Diagnosis not present

## 2019-08-19 DIAGNOSIS — M1712 Unilateral primary osteoarthritis, left knee: Secondary | ICD-10-CM | POA: Diagnosis not present

## 2019-08-20 ENCOUNTER — Other Ambulatory Visit: Payer: Self-pay | Admitting: *Deleted

## 2019-08-20 ENCOUNTER — Telehealth: Payer: Self-pay | Admitting: *Deleted

## 2019-08-20 DIAGNOSIS — M1711 Unilateral primary osteoarthritis, right knee: Secondary | ICD-10-CM

## 2019-08-20 DIAGNOSIS — Z96651 Presence of right artificial knee joint: Secondary | ICD-10-CM

## 2019-08-20 MED ORDER — HYDROCODONE-ACETAMINOPHEN 5-325 MG PO TABS: 1.0000 | ORAL_TABLET | Freq: Four times a day (QID) | ORAL | 0 refills | Status: DC | PRN

## 2019-08-20 NOTE — Telephone Encounter (Signed)
7 day Ortho bundle call completed: Patient requested refill of pain medication before the weekend. Thanks.

## 2019-08-20 NOTE — Telephone Encounter (Signed)
Call to patient to make her aware that med refill has been sent to her pharmacy. Also that she is now scheduled for OPPT on Tuesday, 08/31/19 at 2:00 pm with [facility].

## 2019-08-23 ENCOUNTER — Telehealth: Payer: Self-pay | Admitting: *Deleted

## 2019-08-23 MED ORDER — ONDANSETRON 4 MG PO TBDP
4.0000 mg | ORAL_TABLET | Freq: Three times a day (TID) | ORAL | 0 refills | Status: DC | PRN
Start: 2019-08-23 — End: 2020-01-11

## 2019-08-23 NOTE — Telephone Encounter (Signed)
Patient called and states she is having nausea and some mild vomiting after taking her Norco-so she stopped taking and using Tylenol, which isn't really cutting the pain currently. She also states she hasn't been eating well- so I reinforced if she's not eating enough with her pain medication, this may make her nauseated also. Would some anti-nausea medication be indicated maybe when she takes her pain medication?

## 2019-08-23 NOTE — Telephone Encounter (Signed)
Patient called and updated

## 2019-08-23 NOTE — Telephone Encounter (Signed)
I sent in some Zofran. 

## 2019-08-24 DIAGNOSIS — M4316 Spondylolisthesis, lumbar region: Secondary | ICD-10-CM | POA: Diagnosis not present

## 2019-08-24 DIAGNOSIS — M5116 Intervertebral disc disorders with radiculopathy, lumbar region: Secondary | ICD-10-CM | POA: Diagnosis not present

## 2019-08-24 DIAGNOSIS — M797 Fibromyalgia: Secondary | ICD-10-CM | POA: Diagnosis not present

## 2019-08-24 DIAGNOSIS — Z471 Aftercare following joint replacement surgery: Secondary | ICD-10-CM | POA: Diagnosis not present

## 2019-08-24 DIAGNOSIS — E1142 Type 2 diabetes mellitus with diabetic polyneuropathy: Secondary | ICD-10-CM | POA: Diagnosis not present

## 2019-08-24 DIAGNOSIS — M19049 Primary osteoarthritis, unspecified hand: Secondary | ICD-10-CM | POA: Diagnosis not present

## 2019-08-24 DIAGNOSIS — M810 Age-related osteoporosis without current pathological fracture: Secondary | ICD-10-CM | POA: Diagnosis not present

## 2019-08-24 DIAGNOSIS — M48062 Spinal stenosis, lumbar region with neurogenic claudication: Secondary | ICD-10-CM | POA: Diagnosis not present

## 2019-08-24 DIAGNOSIS — M1712 Unilateral primary osteoarthritis, left knee: Secondary | ICD-10-CM | POA: Diagnosis not present

## 2019-08-26 ENCOUNTER — Ambulatory Visit (INDEPENDENT_AMBULATORY_CARE_PROVIDER_SITE_OTHER): Payer: Medicare PPO | Admitting: Orthopaedic Surgery

## 2019-08-26 ENCOUNTER — Telehealth: Payer: Self-pay | Admitting: *Deleted

## 2019-08-26 ENCOUNTER — Inpatient Hospital Stay: Payer: Medicare PPO | Admitting: Orthopaedic Surgery

## 2019-08-26 ENCOUNTER — Other Ambulatory Visit: Payer: Self-pay

## 2019-08-26 ENCOUNTER — Encounter: Payer: Self-pay | Admitting: Orthopaedic Surgery

## 2019-08-26 DIAGNOSIS — Z96651 Presence of right artificial knee joint: Secondary | ICD-10-CM

## 2019-08-26 NOTE — Progress Notes (Signed)
Tomorrow status post a right total knee arthroplasty.  She had a little bit of setback after surgery was from being sick feeling but is making progress in home therapy.  She states she has been able to flex to 90 degrees.  On exam I did remove her old Steri-Strips in place new Steri-Strips.  Her knee and leg are swollen but her calf is soft.  When I raise it up the swelling dissipates and she has been wearing her compressive garments and taking aspirin twice a day.  She says is not as swollen in the morning.  At this point she will transition outpatient physical therapy for range of motion of her right knee as well as strengthening, balance and coordination and gait training.  I will see her back in 4 weeks to see how she is doing overall.  She can stop her aspirin but will still continue the compressive garments.  All questions and concerns were answered and addressed.

## 2019-08-26 NOTE — Telephone Encounter (Signed)
14 day Ortho bundle call completed.  

## 2019-08-27 DIAGNOSIS — M810 Age-related osteoporosis without current pathological fracture: Secondary | ICD-10-CM | POA: Diagnosis not present

## 2019-08-27 DIAGNOSIS — M4316 Spondylolisthesis, lumbar region: Secondary | ICD-10-CM | POA: Diagnosis not present

## 2019-08-27 DIAGNOSIS — Z471 Aftercare following joint replacement surgery: Secondary | ICD-10-CM | POA: Diagnosis not present

## 2019-08-27 DIAGNOSIS — M5116 Intervertebral disc disorders with radiculopathy, lumbar region: Secondary | ICD-10-CM | POA: Diagnosis not present

## 2019-08-27 DIAGNOSIS — M1712 Unilateral primary osteoarthritis, left knee: Secondary | ICD-10-CM | POA: Diagnosis not present

## 2019-08-27 DIAGNOSIS — M19049 Primary osteoarthritis, unspecified hand: Secondary | ICD-10-CM | POA: Diagnosis not present

## 2019-08-27 DIAGNOSIS — E1142 Type 2 diabetes mellitus with diabetic polyneuropathy: Secondary | ICD-10-CM | POA: Diagnosis not present

## 2019-08-27 DIAGNOSIS — M797 Fibromyalgia: Secondary | ICD-10-CM | POA: Diagnosis not present

## 2019-08-27 DIAGNOSIS — M48062 Spinal stenosis, lumbar region with neurogenic claudication: Secondary | ICD-10-CM | POA: Diagnosis not present

## 2019-08-31 ENCOUNTER — Other Ambulatory Visit: Payer: Self-pay

## 2019-08-31 ENCOUNTER — Ambulatory Visit: Payer: Medicare PPO | Attending: Orthopaedic Surgery | Admitting: Physical Therapy

## 2019-08-31 ENCOUNTER — Encounter: Payer: Self-pay | Admitting: Physical Therapy

## 2019-08-31 DIAGNOSIS — R262 Difficulty in walking, not elsewhere classified: Secondary | ICD-10-CM

## 2019-08-31 DIAGNOSIS — R600 Localized edema: Secondary | ICD-10-CM | POA: Diagnosis not present

## 2019-08-31 DIAGNOSIS — M25661 Stiffness of right knee, not elsewhere classified: Secondary | ICD-10-CM | POA: Diagnosis not present

## 2019-08-31 DIAGNOSIS — M25561 Pain in right knee: Secondary | ICD-10-CM

## 2019-08-31 NOTE — Therapy (Signed)
[place], [place], [address] Phone: [phone]   Fax:  [phone]  Physical Therapy Evaluation  Patient Details  Name: [name] MRN: [id number] Date of Birth: [birth year] Referring Provider (PT): Ninfa Linden   Encounter Date: 08/31/2019  PT End of Session - 08/31/19 1422    Visit Number  1    Date for PT Re-Evaluation  10/31/19    Authorization Type  Medicare    PT Start Time  1350    PT Stop Time  1440    PT Time Calculation (min)  50 min    Activity Tolerance  Patient limited by pain    Behavior During Therapy  [facility] for tasks assessed/performed       Past Medical History:  Diagnosis Date  . Acute blood loss anemia   . Acute encephalopathy 07/15/2016  . AKI (acute kidney injury) ([place])   . Anemia    has sickle cell trait  . Anxiety   . Arthritis   . Asthma    has used inhaler in past for asthmatic bronchitis, last time- early 2012  . Bilateral primary osteoarthritis of knee 03/26/2016  . Complication of anesthesia    wakes up shaking  . Diabetes mellitus   . Diabetes mellitus with neuropathy ([place]) 10/29/2012  . Diverticulitis   . Dyslipidemia   . Encephalopathy 06/2016   due to medications after surgery  . Fibromyalgia   . GERD (gastroesophageal reflux disease)    occas. use of  Prilosec  . Heart murmur    sees Dr. Montez Morita, last seen- early 2012  . Herniated nucleus pulposus, L2-3 06/25/2016  . History of back surgery   . Hypertension    02/2010- stress test /w PCP  . Hypothyroidism   . Loose bowel movements 12/2016  . Lumbar radiculopathy 06/27/2016  . Lumbar stenosis with neurogenic claudication 03/17/2017  . Memory disorder 02/16/2016  . Myoclonic jerking   . Neuromuscular disorder (HCC)    lumbar radiculopathy, lumbago  . Nocturnal leg cramps 09/27/2014  . Osteoporosis 03/10/2014  . Pneumonia   . Sickle cell trait ([place])   . Sleep apnea    borderline sleep apnea,  states she no longer uses, early 2012- stopped using   . Spinal stenosis of lumbar region with radiculopathy 02/28/2014  . Spondylolisthesis of lumbar region 03/19/2011  . Type II or unspecified type diabetes mellitus without mention of complication, uncontrolled 07/08/2013    Past Surgical History:  Procedure Laterality Date  . ABDOMINAL HYSTERECTOMY    . adb.cyst     ovarian cyst  . BACK SURGERY     2012, 2015 (3 total)  . BACK SURGERY  2018   02/24/2017  . COLONOSCOPY    . EYE SURGERY     macular degeneration treatment - injections  . FEMUR IM NAIL Left 06/13/2018   Procedure: INTRAMEDULLARY (IM) NAIL FEMORAL;  Surgeon: [name], MD;  Location: [facility];  Service: Orthopedics;  Laterality: Left;  . OVARIAN CYST SURGERY    . POSTERIOR LUMBAR FUSION 4 LEVEL N/A 03/17/2017   Procedure: Decompression of Lumbar One-Two with Thoracic Ten to Lumbar Two Fusion;  Surgeon: Kristeen Miss, MD;  Location: [place];  Service: Neurosurgery;  Laterality: N/A;  Decompression of L1-2 with T10 to L2 Fusion  . TOTAL KNEE ARTHROPLASTY Right 08/13/2019   Procedure: RIGHT TOTAL KNEE ARTHROPLASTY;  Surgeon: [name], MD;  Location: [facility];  Service: Orthopedics;  Laterality:  Right;    There were no vitals filed for this visit.   Subjective Assessment - 08/31/19 1357    Subjective  Patient underwent a right TKA on 08/13/19.  She reports that she was having pain in the knee for over a year.  History of Left IM nail due to femur fracture last year.One day hospital stay and then to home, had home PT for two weeks.    Pertinent History  has had 5 lumbar surgeries, osteoporosis, right knee DJD    Limitations  Lifting;Standing;Walking;House hold activities    Patient Stated Goals  walk better, be stronger, have no pain, have better ROM    Currently in Pain?  Yes    Pain Score  8     Pain Location  Knee    Pain Orientation  Right    Pain Descriptors / Indicators   Aching;Sore;Tender;Tightness    Pain Type  Acute pain;Surgical pain    Pain Onset  1 to 4 weeks ago    Pain Frequency  Constant    Aggravating Factors   walking, bending pain up to 9/10    Pain Relieving Factors  ice, rest, pain medication at best pain a 6/10    Effect of Pain on Daily Activities  unable to drive, difficulty walking, poor ROM, pain'         [facility] PT Assessment - 08/31/19 0001      Assessment   Medical Diagnosis  s/p right TKA    Referring Provider (PT)  Ninfa Linden    Onset Date/Surgical Date  08/13/19    Prior Therapy  for back and neck      Precautions   Precautions  None      Balance Screen   Has the patient fallen in the past 6 months  No    Has the patient had a decrease in activity level because of a fear of falling?   No    Is the patient reluctant to leave their home because of a fear of falling?   No      Home Environment   Additional Comments  was doing housework, does her own shopping, has stairs at home      Prior Function   Level of Independence  Independent    Vocation  Retired    Leisure  no exercise      Observation/Other Assessments-Edema    Edema  Circumferential      Circumferential Edema   Circumferential - Right  48 cm    Circumferential - Left   43.5 cm      ROM / Strength   AROM / PROM / Strength  AROM;PROM;Strength      AROM   AROM Assessment Site  Knee    Right/Left Hip  Right    Right/Left Knee  Right    Right Knee Extension  20    Right Knee Flexion  80      PROM   PROM Assessment Site  Knee    Right/Left Knee  Right    Right Knee Extension  10    Right Knee Flexion  90      Strength   Overall Strength Comments  3+/5 for hte right knee with pain      Palpation   Palpation comment  she is very swollen and tight, she is tender in the thigh, knee and calf with significant pitting edema      Ambulation/Gait   Gait Comments  uses a FWW, very slow,  stooped over at the waist      Timed Up and Go Test   Normal TUG  (seconds)  60   with FWW               Objective measurements completed on examination: See above findings.      [facility] Adult PT Treatment/Exercise - 08/31/19 0001      Exercises   Exercises  Knee/Hip      Knee/Hip Exercises: Aerobic   Nustep  level 3 x 5 minutes      Modalities   Modalities  Vasopneumatic      Vasopneumatic   Number Minutes Vasopneumatic   10 minutes    Vasopnuematic Location   Knee    Vasopneumatic Pressure  Medium    Vasopneumatic Temperature   37               PT Short Term Goals - 08/31/19 1426      PT SHORT TERM GOAL #1   Title  independent with initial HEP    Time  2    Period  Weeks    Status  New        PT Long Term Goals - 08/31/19 1426      PT LONG TERM GOAL #1   Title  decrease pain 50%    Time  8    Period  Weeks    Status  New      PT LONG TERM GOAL #2   Title  get up from sitting without pain and without hands    Time  8    Period  Weeks    Status  New      PT LONG TERM GOAL #3   Title  increase AROM of the right knee to 5-116 degrees flexion    Time  8    Period  Weeks    Status  New      PT LONG TERM GOAL #4   Title  decrease TUG to 30 seconds    Time  8    Period  Weeks    Status  New             Plan - 08/31/19 1423    Clinical Impression Statement  Patient underewent a right TKA on 08/13/19.  She had home PT the past 2 weeks, she reports that she has a lot of pain, her AROM was 20-80 degrees flexion, her right knee is 5cm larger due to edema and has pitting edema from the knee down to the ankle, she is very tender, her TUG took 60 seconds, uses a FWW, very slow gait, difficulty standing    Personal Factors and Comorbidities  Comorbidity 3+    Comorbidities  DJD right knee, 5 lumbar surgeries, osteoporosis    Examination-Activity Limitations  Bathing;Squat;Carry;Stairs;Stand;Toileting;Transfers;Lift;Locomotion Level    Examination-Participation Restrictions  Cleaning;Meal Prep;Community  Activity;Driving;Laundry;Yard Work    [employer] officer  Evolving/Moderate complexity    Clinical Decision Making  Moderate    Rehab Potential  Good    PT Frequency  3x / week    PT Duration  8 weeks    PT Treatment/Interventions  ADLs/Self Care Home Management;Electrical Stimulation;Moist Heat;Functional mobility training;Stair training;Gait training;Therapeutic activities;Therapeutic exercise;Balance training;Neuromuscular re-education;Manual techniques;Patient/family education;Cryotherapy;Dry needling;Ultrasound;Vasopneumatic Device    PT Next Visit Plan  start working on ROM, gait and function    Consulted and Agree with Plan of Care  Patient       Patient will benefit from skilled therapeutic  intervention in order to improve the following deficits and impairments:  Pain, Increased muscle spasms, Cardiopulmonary status limiting activity, Decreased activity tolerance, Decreased endurance, Decreased range of motion, Decreased strength, Decreased balance, Difficulty walking, Impaired flexibility, Improper body mechanics, Abnormal gait, Increased edema, Decreased mobility  Visit Diagnosis: Acute pain of right knee - Plan: PT plan of care cert/re-cert  Stiffness of right knee, not elsewhere classified - Plan: PT plan of care cert/re-cert  Localized edema - Plan: PT plan of care cert/re-cert  Difficulty in walking, not elsewhere classified - Plan: PT plan of care cert/re-cert     Problem List Patient Active Problem List   Diagnosis Date Noted  . Status post total right knee replacement 08/13/2019  . Unilateral primary osteoarthritis, right knee 05/24/2019  . Fracture, proximal femur ([place]) 06/12/2018  . Femur fracture, left ([place]) 06/12/2018  . Acute blood loss as cause of postoperative anemia 03/30/2017  . Diabetic polyneuropathy associated with secondary diabetes mellitus ([place]) 03/30/2017  . Asthma 03/30/2017  . Dyslipidemia 03/26/2017  . Lumbar stenosis with  neurogenic claudication 03/17/2017  . Spinal stenosis of lumbar region 03/03/2017  . Myoclonic jerking   . Nausea   . Acute encephalopathy 07/15/2016  . Lumbar radiculopathy 06/27/2016  . Hypothyroidism 06/26/2016  . Labile blood glucose   . Surgery, elective   . Post-operative pain   . Sickle cell trait ([place])   . Acute blood loss anemia   . History of back surgery   . AKI (acute kidney injury) ([place])   . Herniated nucleus pulposus, L2-3 06/25/2016  . Bilateral primary osteoarthritis of knee 03/26/2016  . Osteoarthritis, hand 03/26/2016  . Other insomnia 03/26/2016  . Fibromyalgia 09/27/2014  . Nocturnal leg cramps 09/27/2014  . Body mass index (BMI) of 30.0-30.9 in adult 08/04/2014  . Neuropathy 03/10/2014  . Allergic rhinitis 03/10/2014  . Osteoporosis 03/10/2014  . Spinal stenosis of lumbar region with radiculopathy 02/28/2014  . Type 2 diabetes mellitus with complication, with long-term current use of insulin ([place]) 07/08/2013  . Hypokalemia 11/02/2012  . Essential hypertension, benign 11/02/2012  . Diabetes mellitus with neuropathy ([place]) 10/29/2012  . Hyperlipidemia 10/29/2012  . Spondylolisthesis of lumbar region 03/19/2011  . Lumbar radicular pain 03/19/2011    Sumner Boast., PT 08/31/2019, 2:43 PM  [place], [place], [address] Phone: [phone]   Fax:  [phone]  Name: KAYDA HARSH MRN: [id number] Date of Birth: [birth year]

## 2019-09-01 ENCOUNTER — Other Ambulatory Visit: Payer: Self-pay | Admitting: Endocrinology

## 2019-09-02 ENCOUNTER — Encounter: Payer: Self-pay | Admitting: Physical Therapy

## 2019-09-02 ENCOUNTER — Other Ambulatory Visit: Payer: Self-pay

## 2019-09-02 ENCOUNTER — Ambulatory Visit: Payer: Medicare PPO | Admitting: Physical Therapy

## 2019-09-02 DIAGNOSIS — M25561 Pain in right knee: Secondary | ICD-10-CM | POA: Diagnosis not present

## 2019-09-02 DIAGNOSIS — R262 Difficulty in walking, not elsewhere classified: Secondary | ICD-10-CM | POA: Diagnosis not present

## 2019-09-02 DIAGNOSIS — M25661 Stiffness of right knee, not elsewhere classified: Secondary | ICD-10-CM | POA: Diagnosis not present

## 2019-09-02 DIAGNOSIS — R600 Localized edema: Secondary | ICD-10-CM | POA: Diagnosis not present

## 2019-09-02 NOTE — Therapy (Signed)
[place], [place], [address] Phone: [phone]   Fax:  [phone]  Physical Therapy Treatment  Patient Details  Name: [name] MRN: [id number] Date of Birth: 10/16/1940 Referring Provider (PT): Ninfa Linden   Encounter Date: 09/02/2019  PT End of Session - 09/02/19 1135    Visit Number  2    Date for PT Re-Evaluation  10/31/19    Authorization Type  Medicare    PT Start Time  1056    PT Stop Time  1150    PT Time Calculation (min)  54 min    Activity Tolerance  Patient tolerated treatment well;Patient limited by pain    Behavior During Therapy  [facility] for tasks assessed/performed       Past Medical History:  Diagnosis Date  . Acute blood loss anemia   . Acute encephalopathy 07/15/2016  . AKI (acute kidney injury) ([place])   . Anemia    has sickle cell trait  . Anxiety   . Arthritis   . Asthma    has used inhaler in past for asthmatic bronchitis, last time- early 2012  . Bilateral primary osteoarthritis of knee 03/26/2016  . Complication of anesthesia    wakes up shaking  . Diabetes mellitus   . Diabetes mellitus with neuropathy ([place]) 10/29/2012  . Diverticulitis   . Dyslipidemia   . Encephalopathy 06/2016   due to medications after surgery  . Fibromyalgia   . GERD (gastroesophageal reflux disease)    occas. use of  Prilosec  . Heart murmur    sees Dr. Montez Morita, last seen- early 2012  . Herniated nucleus pulposus, L2-3 06/25/2016  . History of back surgery   . Hypertension    02/2010- stress test /w PCP  . Hypothyroidism   . Loose bowel movements 12/2016  . Lumbar radiculopathy 06/27/2016  . Lumbar stenosis with neurogenic claudication 03/17/2017  . Memory disorder 02/16/2016  . Myoclonic jerking   . Neuromuscular disorder (HCC)    lumbar radiculopathy, lumbago  . Nocturnal leg cramps 09/27/2014  . Osteoporosis 03/10/2014  . Pneumonia   . Sickle cell trait ([place])   . Sleep apnea     borderline sleep apnea, states she no longer uses, early 2012- stopped using   . Spinal stenosis of lumbar region with radiculopathy 02/28/2014  . Spondylolisthesis of lumbar region 03/19/2011  . Type II or unspecified type diabetes mellitus without mention of complication, uncontrolled 07/08/2013    Past Surgical History:  Procedure Laterality Date  . ABDOMINAL HYSTERECTOMY    . adb.cyst     ovarian cyst  . BACK SURGERY     2012, 2015 (3 total)  . BACK SURGERY  2018   02/24/2017  . COLONOSCOPY    . EYE SURGERY     macular degeneration treatment - injections  . FEMUR IM NAIL Left 06/13/2018   Procedure: INTRAMEDULLARY (IM) NAIL FEMORAL;  Surgeon: [name], MD;  [facility];  Service: Orthopedics;  Laterality: Left;  . OVARIAN CYST SURGERY    . POSTERIOR LUMBAR FUSION 4 LEVEL N/A 03/17/2017   Procedure: Decompression of Lumbar One-Two with Thoracic Ten to Lumbar Two Fusion;  Surgeon: Kristeen Miss, MD;  Location: [place];  Service: Neurosurgery;  Laterality: N/A;  Decompression of L1-2 with T10 to L2 Fusion  . TOTAL KNEE ARTHROPLASTY Right 08/13/2019   Procedure: RIGHT TOTAL KNEE ARTHROPLASTY;  Surgeon: [name], MD;  Location: [facility];  Service:  Orthopedics;  Laterality: Right;    There were no vitals filed for this visit.  Subjective Assessment - 09/02/19 1057    Subjective  "it is hurting today but I don't know why" "This is a different kind of hurt, It is like a weak hurt today"    Limitations  Lifting;Standing;Walking;House hold activities    Currently in Pain?  Yes    Pain Score  8     Pain Location  Knee    Pain Orientation  Right                        [facility] Adult PT Treatment/Exercise - 09/02/19 0001      Knee/Hip Exercises: Aerobic   Nustep  level 3 x 5 minutes      Knee/Hip Exercises: Seated   Long Arc Quad  Right;2 sets;10 reps    Other Seated Knee/Hip Exercises  Quad Sets x10     Hamstring Curl  Strengthening;2  sets;10 reps    Hamstring Limitations  red tband     Sit to Sand  2 sets;10 reps;with UE support   ELEvated UBE seat     Knee/Hip Exercises: Supine   Quad Sets  Right;Strengthening;2 sets;10 reps;5 reps      Vasopneumatic   Number Minutes Vasopneumatic   15 minutes    Vasopnuematic Location   Knee    Vasopneumatic Pressure  Low    Vasopneumatic Temperature   37      Manual Therapy   Manual Therapy  Passive ROM    Passive ROM  R knee flex and ext with some end range hold               PT Short Term Goals - 08/31/19 1426      PT SHORT TERM GOAL #1   Title  independent with initial HEP    Time  2    Period  Weeks    Status  New        PT Long Term Goals - 08/31/19 1426      PT LONG TERM GOAL #1   Title  decrease pain 50%    Time  8    Period  Weeks    Status  New      PT LONG TERM GOAL #2   Title  get up from sitting without pain and without hands    Time  8    Period  Weeks    Status  New      PT LONG TERM GOAL #3   Title  increase AROM of the right knee to 5-116 degrees flexion    Time  8    Period  Weeks    Status  New      PT LONG TERM GOAL #4   Title  decrease TUG to 30 seconds    Time  8    Period  Weeks    Status  New            Plan - 09/02/19 1136    Clinical Impression Statement  Despite reports of pain throughout session pt was able to complete all of the interventions. Her R knee is swollen down to her feet with pitting edema. She did well allowing PROM but did have some range pain. Increase in medial knee pain reported with supine SAQ.    Personal Factors and Comorbidities  Comorbidity 3+    Comorbidities  DJD right knee, 5 lumbar surgeries, osteoporosis  Examination-Activity Limitations  Bathing;Squat;Carry;Stairs;Stand;Toileting;Transfers;Lift;Locomotion Level    Examination-Participation Restrictions  Cleaning;Meal Prep;Community Activity;Driving;Laundry;Yard Work    [employer] officer  Evolving/Moderate  complexity    Rehab Potential  Good    PT Frequency  3x / week    PT Duration  8 weeks    PT Treatment/Interventions  ADLs/Self Care Home Management;Electrical Stimulation;Moist Heat;Functional mobility training;Stair training;Gait training;Therapeutic activities;Therapeutic exercise;Balance training;Neuromuscular re-education;Manual techniques;Patient/family education;Cryotherapy;Dry needling;Ultrasound;Vasopneumatic Device    PT Next Visit Plan  ROM, gait and function       Patient will benefit from skilled therapeutic intervention in order to improve the following deficits and impairments:  Pain, Increased muscle spasms, Cardiopulmonary status limiting activity, Decreased activity tolerance, Decreased endurance, Decreased range of motion, Decreased strength, Decreased balance, Difficulty walking, Impaired flexibility, Improper body mechanics, Abnormal gait, Increased edema, Decreased mobility  Visit Diagnosis: Acute pain of right knee  Stiffness of right knee, not elsewhere classified  Localized edema  Difficulty in walking, not elsewhere classified     Problem List Patient Active Problem List   Diagnosis Date Noted  . Status post total right knee replacement 08/13/2019  . Unilateral primary osteoarthritis, right knee 05/24/2019  . Fracture, proximal femur ([place]) 06/12/2018  . Femur fracture, left ([place]) 06/12/2018  . Acute blood loss as cause of postoperative anemia 03/30/2017  . Diabetic polyneuropathy associated with secondary diabetes mellitus ([place]) 03/30/2017  . Asthma 03/30/2017  . Dyslipidemia 03/26/2017  . Lumbar stenosis with neurogenic claudication 03/17/2017  . Spinal stenosis of lumbar region 03/03/2017  . Myoclonic jerking   . Nausea   . Acute encephalopathy 07/15/2016  . Lumbar radiculopathy 06/27/2016  . Hypothyroidism 06/26/2016  . Labile blood glucose   . Surgery, elective   . Post-operative pain   . Sickle cell trait ([place])   . Acute blood loss anemia    . History of back surgery   . AKI (acute kidney injury) ([place])   . Herniated nucleus pulposus, L2-3 06/25/2016  . Bilateral primary osteoarthritis of knee 03/26/2016  . Osteoarthritis, hand 03/26/2016  . Other insomnia 03/26/2016  . Fibromyalgia 09/27/2014  . Nocturnal leg cramps 09/27/2014  . Body mass index (BMI) of 30.0-30.9 in adult 08/04/2014  . Neuropathy 03/10/2014  . Allergic rhinitis 03/10/2014  . Osteoporosis 03/10/2014  . Spinal stenosis of lumbar region with radiculopathy 02/28/2014  . Type 2 diabetes mellitus with complication, with long-term current use of insulin ([place]) 07/08/2013  . Hypokalemia 11/02/2012  . Essential hypertension, benign 11/02/2012  . Diabetes mellitus with neuropathy ([place]) 10/29/2012  . Hyperlipidemia 10/29/2012  . Spondylolisthesis of lumbar region 03/19/2011  . Lumbar radicular pain 03/19/2011    Scot Jun, PTA 09/02/2019, 11:39 AM  [place], [place], [address] Phone: [phone]   Fax:  [phone]  Name: [name] MRN: [id number] Date of Birth: 07/20/1940

## 2019-09-06 ENCOUNTER — Other Ambulatory Visit: Payer: Self-pay | Admitting: Orthopaedic Surgery

## 2019-09-06 NOTE — Telephone Encounter (Signed)
Does she need to continue this?

## 2019-09-07 ENCOUNTER — Telehealth: Payer: Self-pay | Admitting: *Deleted

## 2019-09-07 ENCOUNTER — Ambulatory Visit: Payer: Medicare PPO | Admitting: Physical Therapy

## 2019-09-07 NOTE — Telephone Encounter (Signed)
I am fine with her going back on 800 mg ibuprofen.

## 2019-09-07 NOTE — Telephone Encounter (Signed)
Patient having some nausea with her pain medication (Hydrocodone) and doesn't like the way it makes her feel. Wants to know if she can switch back to her 800 mg Ibuprofen you previously prescribed. She has a refill of this on the original Rx. I informed I didn't think it would be an issue as long as her stomach can tolerate and she feels like it controls some of the pain. Ok with you?

## 2019-09-13 ENCOUNTER — Encounter: Payer: Self-pay | Admitting: Physical Therapy

## 2019-09-13 ENCOUNTER — Other Ambulatory Visit: Payer: Self-pay

## 2019-09-13 ENCOUNTER — Ambulatory Visit: Payer: Medicare PPO | Admitting: Physical Therapy

## 2019-09-13 DIAGNOSIS — M25561 Pain in right knee: Secondary | ICD-10-CM

## 2019-09-13 DIAGNOSIS — M25661 Stiffness of right knee, not elsewhere classified: Secondary | ICD-10-CM | POA: Diagnosis not present

## 2019-09-13 DIAGNOSIS — R262 Difficulty in walking, not elsewhere classified: Secondary | ICD-10-CM

## 2019-09-13 DIAGNOSIS — R600 Localized edema: Secondary | ICD-10-CM

## 2019-09-13 NOTE — Therapy (Signed)
[place], [place], [address] Phone: [phone]   Fax:  [phone]  Physical Therapy Treatment  Patient Details  Name: [name] MRN: [id number] Date of Birth: 01/24/1941 Referring Provider (PT): Ninfa Linden   Encounter Date: 09/13/2019  PT End of Session - 09/13/19 1513    Visit Number  3    Date for PT Re-Evaluation  10/31/19    Authorization Type  Medicare    PT Start Time  1438    PT Stop Time  1522    PT Time Calculation (min)  44 min    Activity Tolerance  Patient tolerated treatment well;Patient limited by pain    Behavior During Therapy  [facility] for tasks assessed/performed       Past Medical History:  Diagnosis Date  . Acute blood loss anemia   . Acute encephalopathy 07/15/2016  . AKI (acute kidney injury) ([place])   . Anemia    has sickle cell trait  . Anxiety   . Arthritis   . Asthma    has used inhaler in past for asthmatic bronchitis, last time- early 2012  . Bilateral primary osteoarthritis of knee 03/26/2016  . Complication of anesthesia    wakes up shaking  . Diabetes mellitus   . Diabetes mellitus with neuropathy ([place]) 10/29/2012  . Diverticulitis   . Dyslipidemia   . Encephalopathy 06/2016   due to medications after surgery  . Fibromyalgia   . GERD (gastroesophageal reflux disease)    occas. use of  Prilosec  . Heart murmur    sees Dr. Montez Morita, last seen- early 2012  . Herniated nucleus pulposus, L2-3 06/25/2016  . History of back surgery   . Hypertension    02/2010- stress test /w PCP  . Hypothyroidism   . Loose bowel movements 12/2016  . Lumbar radiculopathy 06/27/2016  . Lumbar stenosis with neurogenic claudication 03/17/2017  . Memory disorder 02/16/2016  . Myoclonic jerking   . Neuromuscular disorder (HCC)    lumbar radiculopathy, lumbago  . Nocturnal leg cramps 09/27/2014  . Osteoporosis 03/10/2014  . Pneumonia   . Sickle cell trait ([place])   . Sleep apnea     borderline sleep apnea, states she no longer uses, early 2012- stopped using   . Spinal stenosis of lumbar region with radiculopathy 02/28/2014  . Spondylolisthesis of lumbar region 03/19/2011  . Type II or unspecified type diabetes mellitus without mention of complication, uncontrolled 07/08/2013    Past Surgical History:  Procedure Laterality Date  . ABDOMINAL HYSTERECTOMY    . adb.cyst     ovarian cyst  . BACK SURGERY     2012, 2015 (3 total)  . BACK SURGERY  2018   02/24/2017  . COLONOSCOPY    . EYE SURGERY     macular degeneration treatment - injections  . FEMUR IM NAIL Left 06/13/2018   Procedure: INTRAMEDULLARY (IM) NAIL FEMORAL;  Surgeon: [name], MD;  Location: [facility];  Service: Orthopedics;  Laterality: Left;  . OVARIAN CYST SURGERY    . POSTERIOR LUMBAR FUSION 4 LEVEL N/A 03/17/2017   Procedure: Decompression of Lumbar One-Two with Thoracic Ten to Lumbar Two Fusion;  Surgeon: Kristeen Miss, MD;  Location: [place];  Service: Neurosurgery;  Laterality: N/A;  Decompression of L1-2 with T10 to L2 Fusion  . TOTAL KNEE ARTHROPLASTY Right 08/13/2019   Procedure: RIGHT TOTAL KNEE ARTHROPLASTY;  Surgeon: [name], MD;  Location: [facility];  Service:  Orthopedics;  Laterality: Right;    There were no vitals filed for this visit.  Subjective Assessment - 09/13/19 1444    Subjective  "it is going ok"    Currently in Pain?  Yes    Pain Score  8     Pain Location  Knee    Pain Orientation  Right         [facility] PT Assessment - 09/13/19 0001      AROM   Right/Left Knee  Right    Right Knee Extension  14    Right Knee Flexion  105                    [facility] Adult PT Treatment/Exercise - 09/13/19 0001      Knee/Hip Exercises: Aerobic   Nustep  level 3 x 6 minutes      Knee/Hip Exercises: Seated   Long Arc Quad  Right;2 sets;10 reps    Long Arc Quad Weight  2 lbs.    Hamstring Curl  Strengthening;2 sets;10 reps    Hamstring Limitations   red tband     Sit to Sand  with UE support;10 reps;1 set   2 airexes on mat table      Vasopneumatic   Number Minutes Vasopneumatic   10 minutes    Vasopnuematic Location   Knee    Vasopneumatic Pressure  Low    Vasopneumatic Temperature   37      Manual Therapy   Manual Therapy  Passive ROM    Passive ROM  R knee flex and ext with some end range hold               PT Short Term Goals - 09/13/19 1516      PT SHORT TERM GOAL #1   Title  independent with initial HEP    Status  Achieved        PT Long Term Goals - 08/31/19 1426      PT LONG TERM GOAL #1   Title  decrease pain 50%    Time  8    Period  Weeks    Status  New      PT LONG TERM GOAL #2   Title  get up from sitting without pain and without hands    Time  8    Period  Weeks    Status  New      PT LONG TERM GOAL #3   Title  increase AROM of the right knee to 5-116 degrees flexion    Time  8    Period  Weeks    Status  New      PT LONG TERM GOAL #4   Title  decrease TUG to 30 seconds    Time  8    Period  Weeks    Status  New            Plan - 09/13/19 1514    Clinical Impression Statement  Pt ~ 8 minutes late today. She reports that she has been working hard at home. She has progressed increasing her R knee AROM. She is limited with R knee extension. Some pain at eng range of MT. Cues needed to straighten RLE out during the SLS phase of marching.    Personal Factors and Comorbidities  Comorbidity 3+    Comorbidities  DJD right knee, 5 lumbar surgeries, osteoporosis    Examination-Activity Limitations  Bathing;Squat;Carry;Stairs;Stand;Toileting;Transfers;Lift;Locomotion Level    Examination-Participation Restrictions  Cleaning;Meal Prep;Community Activity;Driving;Laundry;Yard Work    [employer]  Evolving/Moderate complexity    Rehab Potential  Good    PT Frequency  3x / week    PT Duration  8 weeks    PT Treatment/Interventions  ADLs/Self Care Home  Management;Electrical Stimulation;Moist Heat;Functional mobility training;Stair training;Gait training;Therapeutic activities;Therapeutic exercise;Balance training;Neuromuscular re-education;Manual techniques;Patient/family education;Cryotherapy;Dry needling;Ultrasound;Vasopneumatic Device    PT Next Visit Plan  ROM, gait and function       Patient will benefit from skilled therapeutic intervention in order to improve the following deficits and impairments:  Pain, Increased muscle spasms, Cardiopulmonary status limiting activity, Decreased activity tolerance, Decreased endurance, Decreased range of motion, Decreased strength, Decreased balance, Difficulty walking, Impaired flexibility, Improper body mechanics, Abnormal gait, Increased edema, Decreased mobility  Visit Diagnosis: Stiffness of right knee, not elsewhere classified  Localized edema  Acute pain of right knee  Difficulty in walking, not elsewhere classified     Problem List Patient Active Problem List   Diagnosis Date Noted  . Status post total right knee replacement 08/13/2019  . Unilateral primary osteoarthritis, right knee 05/24/2019  . Fracture, proximal femur ([place]) 06/12/2018  . Femur fracture, left ([place]) 06/12/2018  . Acute blood loss as cause of postoperative anemia 03/30/2017  . Diabetic polyneuropathy associated with secondary diabetes mellitus ([place]) 03/30/2017  . Asthma 03/30/2017  . Dyslipidemia 03/26/2017  . Lumbar stenosis with neurogenic claudication 03/17/2017  . Spinal stenosis of lumbar region 03/03/2017  . Myoclonic jerking   . Nausea   . Acute encephalopathy 07/15/2016  . Lumbar radiculopathy 06/27/2016  . Hypothyroidism 06/26/2016  . Labile blood glucose   . Surgery, elective   . Post-operative pain   . Sickle cell trait ([place])   . Acute blood loss anemia   . History of back surgery   . AKI (acute kidney injury) ([place])   . Herniated nucleus pulposus, L2-3 06/25/2016  . Bilateral primary  osteoarthritis of knee 03/26/2016  . Osteoarthritis, hand 03/26/2016  . Other insomnia 03/26/2016  . Fibromyalgia 09/27/2014  . Nocturnal leg cramps 09/27/2014  . Body mass index (BMI) of 30.0-30.9 in adult 08/04/2014  . Neuropathy 03/10/2014  . Allergic rhinitis 03/10/2014  . Osteoporosis 03/10/2014  . Spinal stenosis of lumbar region with radiculopathy 02/28/2014  . Type 2 diabetes mellitus with complication, with long-term current use of insulin ([place]) 07/08/2013  . Hypokalemia 11/02/2012  . Essential hypertension, benign 11/02/2012  . Diabetes mellitus with neuropathy ([place]) 10/29/2012  . Hyperlipidemia 10/29/2012  . Spondylolisthesis of lumbar region 03/19/2011  . Lumbar radicular pain 03/19/2011    Scot Jun, PTA 09/13/2019, 3:16 PM  [place], [place], [address] Phone: [phone]   Fax:  [phone]  Name: [name] MRN: [id number] Date of Birth: December 20, 1940

## 2019-09-14 ENCOUNTER — Telehealth: Payer: Self-pay | Admitting: *Deleted

## 2019-09-14 ENCOUNTER — Other Ambulatory Visit (INDEPENDENT_AMBULATORY_CARE_PROVIDER_SITE_OTHER): Payer: Medicare PPO

## 2019-09-14 DIAGNOSIS — E1165 Type 2 diabetes mellitus with hyperglycemia: Secondary | ICD-10-CM

## 2019-09-14 DIAGNOSIS — E063 Autoimmune thyroiditis: Secondary | ICD-10-CM | POA: Diagnosis not present

## 2019-09-14 DIAGNOSIS — Z794 Long term (current) use of insulin: Secondary | ICD-10-CM

## 2019-09-14 DIAGNOSIS — E782 Mixed hyperlipidemia: Secondary | ICD-10-CM | POA: Diagnosis not present

## 2019-09-14 LAB — COMPREHENSIVE METABOLIC PANEL
ALT: 10 U/L (ref 0–35)
AST: 15 U/L (ref 0–37)
Albumin: 4.1 g/dL (ref 3.5–5.2)
Alkaline Phosphatase: 108 U/L (ref 39–117)
BUN: 14 mg/dL (ref 6–23)
CO2: 32 mEq/L (ref 19–32)
Calcium: 9.6 mg/dL (ref 8.4–10.5)
Chloride: 104 mEq/L (ref 96–112)
Creatinine, Ser: 0.96 mg/dL (ref 0.40–1.20)
GFR: 67.71 mL/min (ref >60.00)
Glucose, Bld: 102 mg/dL — ABNORMAL HIGH (ref 70–99)
Potassium: 3.8 mEq/L (ref 3.5–5.1)
Sodium: 143 mEq/L (ref 135–145)
Total Bilirubin: 0.5 mg/dL (ref 0.2–1.2)
Total Protein: 6.8 g/dL (ref 6.0–8.3)

## 2019-09-14 LAB — LIPID PANEL
Cholesterol: 240 mg/dL — ABNORMAL HIGH (ref 0–200)
HDL: 47.9 mg/dL (ref >39.00)
LDL Cholesterol: 169 mg/dL — ABNORMAL HIGH (ref 0–99)
NonHDL: 192.14
Total CHOL/HDL Ratio: 5
Triglycerides: 117 mg/dL (ref 0.0–149.0)
VLDL: 23.4 mg/dL (ref 0.0–40.0)

## 2019-09-14 LAB — TSH: TSH: 2.68 u[IU]/mL (ref 0.35–4.50)

## 2019-09-14 LAB — T4, FREE: Free T4: 1.06 ng/dL (ref 0.60–1.60)

## 2019-09-14 LAB — HEMOGLOBIN A1C: Hgb A1c MFr Bld: 5.9 % (ref 4.6–6.5)

## 2019-09-14 MED ORDER — HYDROCODONE-ACETAMINOPHEN 5-325 MG PO TABS: 1.0000 | ORAL_TABLET | Freq: Four times a day (QID) | ORAL | 0 refills | Status: DC | PRN

## 2019-09-14 NOTE — Telephone Encounter (Signed)
Patient called requesting refill of Hydrocodone. Thanks. 

## 2019-09-15 ENCOUNTER — Other Ambulatory Visit: Payer: Self-pay

## 2019-09-15 ENCOUNTER — Encounter: Payer: Self-pay | Admitting: Physical Therapy

## 2019-09-15 ENCOUNTER — Ambulatory Visit: Payer: Medicare PPO | Admitting: Physical Therapy

## 2019-09-15 ENCOUNTER — Telehealth: Payer: Self-pay | Admitting: *Deleted

## 2019-09-15 DIAGNOSIS — R262 Difficulty in walking, not elsewhere classified: Secondary | ICD-10-CM | POA: Diagnosis not present

## 2019-09-15 DIAGNOSIS — R600 Localized edema: Secondary | ICD-10-CM

## 2019-09-15 DIAGNOSIS — M25561 Pain in right knee: Secondary | ICD-10-CM | POA: Diagnosis not present

## 2019-09-15 DIAGNOSIS — M25661 Stiffness of right knee, not elsewhere classified: Secondary | ICD-10-CM | POA: Diagnosis not present

## 2019-09-15 NOTE — Telephone Encounter (Signed)
30 day call completed.

## 2019-09-15 NOTE — Therapy (Signed)
[place], [place], [address] Phone: [phone]   Fax:  [phone]  Physical Therapy Treatment  Patient Details  Name: [name] MRN: [id number] Date of Birth: June 26, 1940 Referring Provider (PT): Ninfa Linden   Encounter Date: 09/15/2019  PT End of Session - 09/15/19 1601    Visit Number  4    Date for PT Re-Evaluation  10/31/19    Authorization Type  Medicare    PT Start Time  [id number]    PT Stop Time  1608    PT Time Calculation (min)  53 min    Activity Tolerance  Patient tolerated treatment well;Patient limited by pain       Past Medical History:  Diagnosis Date  . Acute blood loss anemia   . Acute encephalopathy 07/15/2016  . AKI (acute kidney injury) ([place])   . Anemia    has sickle cell trait  . Anxiety   . Arthritis   . Asthma    has used inhaler in past for asthmatic bronchitis, last time- early 2012  . Bilateral primary osteoarthritis of knee 03/26/2016  . Complication of anesthesia    wakes up shaking  . Diabetes mellitus   . Diabetes mellitus with neuropathy ([place]) 10/29/2012  . Diverticulitis   . Dyslipidemia   . Encephalopathy 06/2016   due to medications after surgery  . Fibromyalgia   . GERD (gastroesophageal reflux disease)    occas. use of  Prilosec  . Heart murmur    sees Dr. Montez Morita, last seen- early 2012  . Herniated nucleus pulposus, L2-3 06/25/2016  . History of back surgery   . Hypertension    02/2010- stress test /w PCP  . Hypothyroidism   . Loose bowel movements 12/2016  . Lumbar radiculopathy 06/27/2016  . Lumbar stenosis with neurogenic claudication 03/17/2017  . Memory disorder 02/16/2016  . Myoclonic jerking   . Neuromuscular disorder (HCC)    lumbar radiculopathy, lumbago  . Nocturnal leg cramps 09/27/2014  . Osteoporosis 03/10/2014  . Pneumonia   . Sickle cell trait ([place])   . Sleep apnea    borderline sleep apnea, states she no longer uses, early  2012- stopped using   . Spinal stenosis of lumbar region with radiculopathy 02/28/2014  . Spondylolisthesis of lumbar region 03/19/2011  . Type II or unspecified type diabetes mellitus without mention of complication, uncontrolled 07/08/2013    Past Surgical History:  Procedure Laterality Date  . ABDOMINAL HYSTERECTOMY    . adb.cyst     ovarian cyst  . BACK SURGERY     2012, 2015 (3 total)  . BACK SURGERY  2018   02/24/2017  . COLONOSCOPY    . EYE SURGERY     macular degeneration treatment - injections  . FEMUR IM NAIL Left 06/13/2018   Procedure: INTRAMEDULLARY (IM) NAIL FEMORAL;  Surgeon: [name], MD;  Location: [facility];  Service: Orthopedics;  Laterality: Left;  . OVARIAN CYST SURGERY    . POSTERIOR LUMBAR FUSION 4 LEVEL N/A 03/17/2017   Procedure: Decompression of Lumbar One-Two with Thoracic Ten to Lumbar Two Fusion;  Surgeon: Kristeen Miss, MD;  Location: [facility];  Service: Neurosurgery;  Laterality: N/A;  Decompression of L1-2 with T10 to L2 Fusion  . TOTAL KNEE ARTHROPLASTY Right 08/13/2019   Procedure: RIGHT TOTAL KNEE ARTHROPLASTY;  Surgeon: [name], MD;  Location: [facility];  Service: Orthopedics;  Laterality: Right;    There were no vitals  filed for this visit.  Subjective Assessment - 09/15/19 1519    Subjective  "I am doing ok, I just hurt" Pt reports increase pain and stiffness in the morning    Pertinent History  has had 5 lumbar surgeries, osteoporosis, right knee DJD    Currently in Pain?  Yes    Pain Score  8     Pain Location  Knee    Pain Orientation  Right                        [facility] Adult PT Treatment/Exercise - 09/15/19 0001      High Level Balance   High Level Balance Activities  Side stepping    High Level Balance Comments  HHA x 2      Knee/Hip Exercises: Aerobic   Nustep  level 3 x 6 minutes      Knee/Hip Exercises: Standing   Other Standing Knee Exercises  standing march in RW 2x10       Knee/Hip  Exercises: Seated   Long Arc Quad  Right;2 sets;10 reps    Long Arc Quad Weight  3 lbs.    Other Seated Knee/Hip Exercises  fitter press 2 blue 2x10 one set yellow TKE     Hamstring Curl  Strengthening;2 sets;10 reps    Hamstring Limitations  red tband     Sit to Sand  2 sets;5 reps;with UE support   elevated UBE seat     Vasopneumatic   Number Minutes Vasopneumatic   10 minutes    Vasopnuematic Location   Knee    Vasopneumatic Pressure  Low    Vasopneumatic Temperature   37      Manual Therapy   Manual Therapy  Passive ROM    Passive ROM  R knee flex and ext with some end range hold               PT Short Term Goals - 09/13/19 1516      PT SHORT TERM GOAL #1   Title  independent with initial HEP    Status  Achieved        PT Long Term Goals - 09/15/19 1605      PT LONG TERM GOAL #1   Title  decrease pain 50%    Status  On-going      PT LONG TERM GOAL #2   Title  get up from sitting without pain and without hands    Status  On-going      PT LONG TERM GOAL #3   Title  increase AROM of the right knee to 5-116 degrees flexion    Status  On-going            Plan - 09/15/19 1601    Clinical Impression Statement  PROM remains well overall. R knee and distal LE remains swollen. She did well with fitter presses. Some end range pain with MT. Increase fatigue noted with standing marches. Some side steps.    Personal Factors and Comorbidities  Comorbidity 3+    Comorbidities  DJD right knee, 5 lumbar surgeries, osteoporosis    Examination-Activity Limitations  Bathing;Squat;Carry;Stairs;Stand;Toileting;Transfers;Lift;Locomotion Level    Examination-Participation Restrictions  Cleaning;Meal Prep;Community Activity;Driving;Laundry;Yard Work    Stability/Clinical Decision Making  Evolving/Moderate complexity    Rehab Potential  Good    PT Frequency  3x / week    PT Duration  8 weeks    PT Next Visit Plan  ROM, gait and function  Patient will benefit  from skilled therapeutic intervention in order to improve the following deficits and impairments:  Pain, Increased muscle spasms, Cardiopulmonary status limiting activity, Decreased activity tolerance, Decreased endurance, Decreased range of motion, Decreased strength, Decreased balance, Difficulty walking, Impaired flexibility, Improper body mechanics, Abnormal gait, Increased edema, Decreased mobility  Visit Diagnosis: Stiffness of right knee, not elsewhere classified  Localized edema  Acute pain of right knee  Difficulty in walking, not elsewhere classified     Problem List Patient Active Problem List   Diagnosis Date Noted  . Status post total right knee replacement 08/13/2019  . Unilateral primary osteoarthritis, right knee 05/24/2019  . Fracture, proximal femur ([place]) 06/12/2018  . Femur fracture, left ([place]) 06/12/2018  . Acute blood loss as cause of postoperative anemia 03/30/2017  . Diabetic polyneuropathy associated with secondary diabetes mellitus ([place]) 03/30/2017  . Asthma 03/30/2017  . Dyslipidemia 03/26/2017  . Lumbar stenosis with neurogenic claudication 03/17/2017  . Spinal stenosis of lumbar region 03/03/2017  . Myoclonic jerking   . Nausea   . Acute encephalopathy 07/15/2016  . Lumbar radiculopathy 06/27/2016  . Hypothyroidism 06/26/2016  . Labile blood glucose   . Surgery, elective   . Post-operative pain   . Sickle cell trait ([place])   . Acute blood loss anemia   . History of back surgery   . AKI (acute kidney injury) ([place])   . Herniated nucleus pulposus, L2-3 06/25/2016  . Bilateral primary osteoarthritis of knee 03/26/2016  . Osteoarthritis, hand 03/26/2016  . Other insomnia 03/26/2016  . Fibromyalgia 09/27/2014  . Nocturnal leg cramps 09/27/2014  . Body mass index (BMI) of 30.0-30.9 in adult 08/04/2014  . Neuropathy 03/10/2014  . Allergic rhinitis 03/10/2014  . Osteoporosis 03/10/2014  . Spinal stenosis of lumbar region with radiculopathy 02/28/2014   . Type 2 diabetes mellitus with complication, with long-term current use of insulin ([place]) 07/08/2013  . Hypokalemia 11/02/2012  . Essential hypertension, benign 11/02/2012  . Diabetes mellitus with neuropathy ([place]) 10/29/2012  . Hyperlipidemia 10/29/2012  . Spondylolisthesis of lumbar region 03/19/2011  . Lumbar radicular pain 03/19/2011    Scot Jun, PTA 09/15/2019, 4:06 PM  [place], [place], [address] Phone: [phone]   Fax:  [phone]  Name: BAR STEINBERGER MRN: [id number] Date of Birth: [birth year]

## 2019-09-16 ENCOUNTER — Ambulatory Visit (INDEPENDENT_AMBULATORY_CARE_PROVIDER_SITE_OTHER): Payer: Medicare PPO | Admitting: Endocrinology

## 2019-09-16 ENCOUNTER — Encounter: Payer: Self-pay | Admitting: Endocrinology

## 2019-09-16 VITALS — BP 150/70 | HR 75 | Ht 66.0 in | Wt 176.4 lb

## 2019-09-16 DIAGNOSIS — Z794 Long term (current) use of insulin: Secondary | ICD-10-CM

## 2019-09-16 DIAGNOSIS — E782 Mixed hyperlipidemia: Secondary | ICD-10-CM

## 2019-09-16 DIAGNOSIS — E063 Autoimmune thyroiditis: Secondary | ICD-10-CM | POA: Diagnosis not present

## 2019-09-16 DIAGNOSIS — E1165 Type 2 diabetes mellitus with hyperglycemia: Secondary | ICD-10-CM

## 2019-09-16 MED ORDER — REPATHA SURECLICK 140 MG/ML ~~LOC~~ SOAJ: SUBCUTANEOUS | 3 refills | Status: DC

## 2019-09-16 NOTE — Progress Notes (Signed)
Patient ID: [name], female   DOB: 22-Jan-1941, 79 y.o.   MRN: [id number]      Reason for Appointment: Endocrinology follow-up  History of Present Illness    PROBLEM 1: Type 2 diabetes mellitus, date of diagnosis: 1998.   Prior history: She had previously been treated with Byetta, Glumetza, Amaryl, Victoza and  Onglyza However because of inadequate control and intolerance to drugs she was finally given pre-meal insulin along with Amaryl, Glumetza eventually stopped because of side effects and also Lantus added  Recent history:  The insulin regimen is: Novolog 6 at breakfast and 9 lunch, 8-10 acs Tresiba 20 units at lunch  Oral agents: Farxiga 5 mg daily  Her A1c is now 5.9, has been mostly between about 6.2 -7.4  Current blood sugar patterns and problems with management:  She has somewhat lower sugars and some weight loss since her knee surgery  This is likely to be from decreased intake  However she forgets to check her sugars and has only 7 readings in the last 14 days  Most of her morning readings are fairly close to normal including in the lab without overnight hypoglycemia  She was told to reduce Tresiba to 20 units on the last visit to prevent any hypoglycemia  She has only 1 or 2 readings nonfasting; blood sugars do go up when she eats ice cream without adequate coverage  Has been continuing on Farxiga without change in renal function  She was told to take 4 units of NovoLog if she is eating ice cream  Side effects from diabetes medications: Victoza caused nausea.  Metformin  Monitors blood glucose: 1-2 times a day, readings as below  Glucometer: Accu-Chek Aviva  FASTING range 103-124 Afternoon 123, evening 201  Previous readings:  PRE-MEAL Fasting Lunch Dinner Bedtime Overall  Glucose range:  97-143   72-225   72-228  Mean/median:  110     125   POST-MEAL PC Breakfast PC Lunch PC Dinner  Glucose range:   116, 228  171-194   Mean/median:    180     Meals: 2-3 meals per day at 10 am. 2 pm and 6-7 pm but inconsistent schedule.  Breakfast may be [place] sausage and egg with toast, occasionally oatmeal or cereal   Physical activity: exercise: Minimal, previously doing water aerobics 3/7 days a week  Certified Diabetes Educator visit: Most recent: 7/13.  Dietician visit: Most recent: 3/13.   Wt Readings from Last 3 Encounters:  09/16/19 176 lb 6.4 oz (80 kg)  08/13/19 176 lb 7 oz (80 kg)  08/10/19 176 lb 7 oz (80 kg)   Complications: Neuropathy  LABS:  Lab Results  Component Value Date   HGBA1C 5.9 09/14/2019   HGBA1C 6.5 (H) 08/10/2019   HGBA1C 6.4 06/14/2019   Lab Results  Component Value Date   MICROALBUR 4.8 (H) 02/09/2019   LDLCALC 169 (H) 09/14/2019   CREATININE 0.96 09/14/2019   Lab Results  Component Value Date   FRUCTOSAMINE 283 06/09/2018   FRUCTOSAMINE 296 (H) 05/16/2017   FRUCTOSAMINE 322 (H) 10/10/2016    Multiple other issues are addressed in review of systems  Lab on 09/14/2019  Component Date Value Ref Range Status  . Free T4 09/14/2019 1.06  0.60 - 1.60 ng/dL Final   Comment: Specimens from patients who are undergoing biotin therapy and /or ingesting biotin supplements may contain high levels of biotin.  The higher biotin concentration in these specimens interferes with this Free  T4 assay.  Specimens that contain high levels  of biotin may cause false high results for this Free T4 assay.  Please interpret results in light of the total clinical presentation of the patient.    Marland Kitchen TSH 09/14/2019 2.68  0.35 - 4.50 uIU/mL Final  . Cholesterol 09/14/2019 240* 0 - 200 mg/dL Final   ATP III Classification       Desirable:  < 200 mg/dL               Borderline High:  200 - 239 mg/dL          High:  > = 240 mg/dL  . Triglycerides 09/14/2019 117.0  0.0 - 149.0 mg/dL Final   Normal:  <150 mg/dLBorderline High:  150 - 199 mg/dL  . HDL 09/14/2019 47.90  >39.00 mg/dL Final  . VLDL  09/14/2019 23.4  0.0 - 40.0 mg/dL Final  . LDL Cholesterol 09/14/2019 169* 0 - 99 mg/dL Final  . Total CHOL/HDL Ratio 09/14/2019 5   Final                  Men          Women1/2 Average Risk     3.4          3.3Average Risk          5.0          4.42X Average Risk          9.6          7.13X Average Risk          15.0          11.0                      . NonHDL 09/14/2019 192.14   Final   NOTE:  Non-HDL goal should be 30 mg/dL higher than patient's LDL goal (i.e. LDL goal of < 70 mg/dL, would have non-HDL goal of < 100 mg/dL)  . Sodium 09/14/2019 143  135 - 145 mEq/L Final  . Potassium 09/14/2019 3.8  3.5 - 5.1 mEq/L Final  . Chloride 09/14/2019 104  96 - 112 mEq/L Final  . CO2 09/14/2019 32  19 - 32 mEq/L Final  . Glucose, Bld 09/14/2019 102* 70 - 99 mg/dL Final  . BUN 09/14/2019 14  6 - 23 mg/dL Final  . Creatinine, Ser 09/14/2019 0.96  0.40 - 1.20 mg/dL Final  . Total Bilirubin 09/14/2019 0.5  0.2 - 1.2 mg/dL Final  . Alkaline Phosphatase 09/14/2019 108  39 - 117 U/L Final  . AST 09/14/2019 15  0 - 37 U/L Final  . ALT 09/14/2019 10  0 - 35 U/L Final  . Total Protein 09/14/2019 6.8  6.0 - 8.3 g/dL Final  . Albumin 09/14/2019 4.1  3.5 - 5.2 g/dL Final  . GFR 09/14/2019 67.71  >60.00 mL/min Final  . Calcium 09/14/2019 9.6  8.4 - 10.5 mg/dL Final  . Hgb A1c MFr Bld 09/14/2019 5.9  4.6 - 6.5 % Final   Glycemic Control Guidelines for People with Diabetes:Non Diabetic:  <6%Goal of Therapy: <7%Additional Action Suggested:  >8%       Allergies as of 09/16/2019      Reactions   Cymbalta [duloxetine Hcl] Diarrhea, Other (See Comments)   Dizziness, headache, irritability   Baclofen    jerks    Betadine [povidone Iodine] Swelling, Other (See Comments)   SWELLING REACTION DESCRIPTION/SEVERITY UNSPECIFIED  Reaction to betadine  eye drops   Percocet [oxycodone-acetaminophen]    Confusion   Adhesive [tape] Rash   Lipitor [atorvastatin] Rash      Medication List       Accurate as of Sep 16, 2019  8:42 PM. If you have any questions, ask your nurse or doctor.        Accu-Chek Aviva Plus w/Device Kit Use Accu Chek Aviva as instructed to check blood sugar 4 times daily.   acetaminophen 500 MG tablet Commonly known as: TYLENOL Take 1,000 mg by mouth every 8 (eight) hours as needed for mild pain or moderate pain.   aspirin 81 MG chewable tablet Chew 1 tablet (81 mg total) by mouth 2 (two) times daily.   BD Pen Needle Nano U/F 32G X 4 MM Misc Generic drug: Insulin Pen Needle Use to inject insulin 4 times daily.   Bystolic 5 MG tablet Generic drug: nebivolol Take 5 mg by mouth daily.   cetirizine 10 MG tablet Commonly known as: ZYRTEC Take 1 tablet (10 mg total) by mouth daily as needed for allergies.   Farxiga 5 MG Tabs tablet Generic drug: dapagliflozin propanediol Take 1 tablet by mouth once daily. DX:E11.65 What changed:   how much to take  how to take this  when to take this   fluticasone 50 MCG/ACT nasal spray Commonly known as: FLONASE Place 1 spray into both nostrils daily as needed for allergies. What changed:   how much to take  when to take this   gabapentin 600 MG tablet Commonly known as: NEURONTIN TAKE 1 TABLET(600 MG) BY MOUTH THREE TIMES DAILY What changed: See the new instructions.   glucose blood test strip Use Accu Chek Aviva test strips as instructed to check blood sugar 4 times daily.   HYDROcodone-acetaminophen 5-325 MG tablet Commonly known as: Norco Take 1-2 tablets by mouth every 6 (six) hours as needed for moderate pain.   ibuprofen 800 MG tablet Commonly known as: ADVIL Take 1 tablet (800 mg total) by mouth every 8 (eight) hours as needed. What changed: reasons to take this   irbesartan 300 MG tablet Commonly known as: AVAPRO Take 300 mg by mouth daily.   ketoconazole 2 % cream Commonly known as: NIZORAL Apply 1 application topically See admin instructions. For 14 days for irritation when needed    levothyroxine 50 MCG tablet Commonly known as: SYNTHROID TAKE 1 TABLET(50 MCG) BY MOUTH DAILY What changed: See the new instructions.   MAGNESIUM PO Take 250 mg by mouth 2 (two) times daily.   meloxicam 15 MG tablet Commonly known as: MOBIC Take 15 mg by mouth daily.   methocarbamol 500 MG tablet Commonly known as: ROBAXIN Take 1 tablet (500 mg total) by mouth every 6 (six) hours as needed for muscle spasms.   NovoLOG FlexPen 100 UNIT/ML FlexPen Generic drug: insulin aspart Inject 6 units under the skin before breakfast, 9 units at lunch, and 8-10 units at dinner. 3-4 PRN for large carbs or ice cream. DX:E11.65   OCUVITE ADULT FORMULA PO Take 1 tablet by mouth daily.   multivitamin with minerals tablet Take 1 tablet by mouth daily.   ondansetron 4 MG disintegrating tablet Commonly known as: Zofran ODT Take 1 tablet (4 mg total) by mouth every 8 (eight) hours as needed for nausea or vomiting.   potassium chloride 10 MEQ tablet Commonly known as: KLOR-CON Take 10 mEq by mouth daily as needed (low potassium).   Repatha SureClick 209 MG/ML Soaj Generic drug: Evolocumab  Inject contents of pen in stomach area every 2 weeks Started by: [name], MD   Symbicort 80-4.5 MCG/ACT inhaler Generic drug: budesonide-formoterol Inhale 2 puffs into the lungs 2 (two) times daily as needed (shortness of breath).   Tyler Aas FlexTouch 100 UNIT/ML FlexTouch Pen Generic drug: insulin degludec INJECT 22 UNITS UNDER THE SKIN EVERY DAY   zinc gluconate 50 MG tablet Take 50 mg by mouth daily.       Allergies:  Allergies  Allergen Reactions  . Cymbalta [Duloxetine Hcl] Diarrhea and Other (See Comments)    Dizziness, headache, irritability  . Baclofen     jerks   . Betadine [Povidone Iodine] Swelling and Other (See Comments)    SWELLING REACTION DESCRIPTION/SEVERITY UNSPECIFIED  Reaction to betadine eye drops  . Percocet [Oxycodone-Acetaminophen]     Confusion   . Adhesive  [Tape] Rash  . Lipitor [Atorvastatin] Rash    Past Medical History:  Diagnosis Date  . Acute blood loss anemia   . Acute encephalopathy 07/15/2016  . AKI (acute kidney injury) ([place])   . Anemia    has sickle cell trait  . Anxiety   . Arthritis   . Asthma    has used inhaler in past for asthmatic bronchitis, last time- early 2012  . Bilateral primary osteoarthritis of knee 03/26/2016  . Complication of anesthesia    wakes up shaking  . Diabetes mellitus   . Diabetes mellitus with neuropathy ([place]) 10/29/2012  . Diverticulitis   . Dyslipidemia   . Encephalopathy 06/2016   due to medications after surgery  . Fibromyalgia   . GERD (gastroesophageal reflux disease)    occas. use of  Prilosec  . Heart murmur    sees Dr. Montez Morita, last seen- early 2012  . Herniated nucleus pulposus, L2-3 06/25/2016  . History of back surgery   . Hypertension    02/2010- stress test /w PCP  . Hypothyroidism   . Loose bowel movements 12/2016  . Lumbar radiculopathy 06/27/2016  . Lumbar stenosis with neurogenic claudication 03/17/2017  . Memory disorder 02/16/2016  . Myoclonic jerking   . Neuromuscular disorder ([facility])    lumbar radiculopathy, lumbago  . Nocturnal leg cramps 09/27/2014  . Osteoporosis 03/10/2014  . Pneumonia   . Sickle cell trait ([place])   . Sleep apnea    borderline sleep apnea, states she no longer uses, early 2012- stopped using   . Spinal stenosis of lumbar region with radiculopathy 02/28/2014  . Spondylolisthesis of lumbar region 03/19/2011  . Type II or unspecified type diabetes mellitus without mention of complication, uncontrolled 07/08/2013    Past Surgical History:  Procedure Laterality Date  . ABDOMINAL HYSTERECTOMY    . adb.cyst     ovarian cyst  . BACK SURGERY     2012, 2015 (3 total)  . BACK SURGERY  2018   02/24/2017  . COLONOSCOPY    . EYE SURGERY     macular degeneration treatment - injections  . FEMUR IM NAIL Left 06/13/2018   Procedure: INTRAMEDULLARY (IM) NAIL  FEMORAL;  Surgeon: [name], MD;  Location: [facility];  Service: Orthopedics;  Laterality: Left;  . OVARIAN CYST SURGERY    . POSTERIOR LUMBAR FUSION 4 LEVEL N/A 03/17/2017   Procedure: Decompression of Lumbar One-Two with Thoracic Ten to Lumbar Two Fusion;  Surgeon: Kristeen Miss, MD;  Location: [facility];  Service: Neurosurgery;  Laterality: N/A;  Decompression of L1-2 with T10 to L2 Fusion  . TOTAL KNEE ARTHROPLASTY Right 08/13/2019  Procedure: RIGHT TOTAL KNEE ARTHROPLASTY;  Surgeon: [name], MD;  Location: [facility];  Service: Orthopedics;  Laterality: Right;    Family History  Problem Relation Age of Onset  . Ovarian cancer Mother   . Cancer - Prostate Father   . Breast cancer Paternal Aunt   . Multiple myeloma Paternal Aunt   . Anesthesia problems Neg Hx   . Hypotension Neg Hx   . Malignant hyperthermia Neg Hx   . Pseudochol deficiency Neg Hx     Social History:  reports that she quit smoking about 16 years ago. Her smoking use included cigarettes. She has a 3.00 pack-year smoking history. She has never used smokeless tobacco. She reports current alcohol use. She reports that she does not use drugs.  ROS    NEUROPATHY: She has had tingling in feet and legs especially at night. She is taking gabapentin without consistent relief at night Also she will take Lyrica with somewhat better relief at night This was also prescribed as needed for fibromyalgia by her rheumatologist  Hyperlipidemia:   The lipid abnormality consists of elevated LDL persistently Did not tolerate Crestor or lovastatin because of muscle cramps  She is off Zetia presumably because she was having muscle cramps  Her lipids are poorly controlled without any treatment She is recently eating small portions and only occasional foods like ice cream  LDL 169 Previously did not have coverage for Livalo  Lab Results  Component Value Date   CHOL 240 (H) 09/14/2019   HDL 47.90 09/14/2019    LDLCALC 169 (H) 09/14/2019   LDLDIRECT 166.0 02/09/2019   TRIG 117.0 09/14/2019   CHOLHDL 5 09/14/2019      HYPERTENSION:  Has been present for several years.  Apparently she is on Avapro HCTZ from PCP along with amlodipine Blood pressure is higher today but she has had more pain  BP Readings from Last 3 Encounters:  09/16/19 (!) 150/70  08/14/19 127/67  08/10/19 (!) 153/76   . Has a history of leg and muscle cramps and takes magnesium supplements twice a day  Her PCP has prescribed potassium supplements and has taken 1 tablet daily, asking about refills Potassium as follows:  Lab Results  Component Value Date   K 3.8 09/14/2019        Hypothyroidism  She had a relatively high TSH as of 4/17 and was empirically given Synthroid 25 g Subsequently has been on 50 mcg Usually has some nonspecific fatigue related to other problems  TSH history as follows   Lab Results  Component Value Date   TSH 2.68 09/14/2019   TSH 3.74 02/09/2019   TSH 4.85 (H) 10/14/2018   FREET4 1.06 09/14/2019   FREET4 0.88 02/09/2019   FREET4 0.97 10/14/2018         Examination:   BP (!) 150/70 (BP Location: Left Arm, Patient Position: Sitting, Cuff Size: Normal)   Pulse 75   Ht '5\' 6"'  (1.676 m)   Wt 176 lb 6.4 oz (80 kg)   LMP  (LMP Unknown)   SpO2 96%   BMI 28.47 kg/m   Body mass index is 28.47 kg/m.     Assesment/Plan:   1. DIABETES type 2   See history of present illness for detailed discussion of management, blood sugar patterns and problems identified  She is on basal bolus insulin and Farxiga  A1c is better at 5.9   Blood sugars are relatively lower because of decreased overall intake and some weight loss  with pain after her knee surgery She is still on basal bolus insulin without much change in her insulin requirement recently Has checked blood sugars infrequently and usually not after meals Fasting readings are fairly steady in the low 100 range, now on 20 units  Tresiba since her last visit in February No hypoglycemia at any other time Able to maintain some weight loss with [place] Glade which is working safely without side effects  She will continue her regimen including Farxiga and change Discussed that we need to check readings after meals more consistently to help adjust her mealtime dose Also to take extra for use of NovoLog if eating sweets like ice cream  3. Hypertension: Blood pressure is relatively higher, maintained on treatment by PCP and today may be higher because of pain  She needs to get refill on her potassium supplement from her PCP   4.   Hypothyroidism: She is taking 50 mcg levothyroxine consistently normal TSH  She does have long-term fatigue and she will discuss with PCP  LIPIDS: We will check to see if Repatha is covered affordably; will send a prescription to the pharmacy Likely cannot get adequate control with low-dose statin drugs since LDL is almost 170   Follow-up in 2 months     There are no Patient Instructions on file for this visit.       [name] 09/16/2019, 8:42 PM

## 2019-09-17 ENCOUNTER — Telehealth: Payer: Self-pay

## 2019-09-17 NOTE — Telephone Encounter (Signed)
PA initiated via [email or web address] for Repatha.  Doreen Beam ([employer]ess) Rx #: [id number] Repatha SureClick 140MG /ML auto-injectors   Form [employer] Electronic PA Form Created 18 hours ago Sent to Plan 4 minutes ago Plan Response 4 minutes ago Submit Clinical Questions less than a minute ago Determination Wait for Determination Please wait for [facility] NCPDP 2017 to return a determination.

## 2019-09-17 NOTE — Telephone Encounter (Signed)
PA for Repatha has been approved   [employer] (Key: Virgel Gess) Rx #: [id number] Repatha SureClick 140MG /ML auto-injectors   Form [employer] Electronic PA Form Created 19 hours ago Sent to Plan 13 minutes ago Plan Response 13 minutes ago Submit Clinical Questions 9 minutes ago Determination Favorable 9 minutes ago Message from Plan PA Case: [id number], Status: Approved, Coverage Starts on: 09/17/2019 12:00:00 AM, Coverage Ends on: 03/15/2020 12:00:00 AM. Questions? Contact [phone].

## 2019-09-20 ENCOUNTER — Encounter (HOSPITAL_COMMUNITY): Payer: Self-pay | Admitting: Emergency Medicine

## 2019-09-20 ENCOUNTER — Other Ambulatory Visit: Payer: Self-pay

## 2019-09-20 ENCOUNTER — Emergency Department (HOSPITAL_COMMUNITY): Payer: Medicare PPO

## 2019-09-20 ENCOUNTER — Emergency Department (HOSPITAL_COMMUNITY)
Admission: EM | Admit: 2019-09-20 | Discharge: 2019-09-20 | Disposition: A | Discharge status: Home / Self Care | Payer: Medicare PPO | Attending: Emergency Medicine | Admitting: Emergency Medicine

## 2019-09-20 DIAGNOSIS — E119 Type 2 diabetes mellitus without complications: Secondary | ICD-10-CM | POA: Insufficient documentation

## 2019-09-20 DIAGNOSIS — Z87891 Personal history of nicotine dependence: Secondary | ICD-10-CM | POA: Diagnosis not present

## 2019-09-20 DIAGNOSIS — I10 Essential (primary) hypertension: Secondary | ICD-10-CM | POA: Diagnosis not present

## 2019-09-20 DIAGNOSIS — J45909 Unspecified asthma, uncomplicated: Secondary | ICD-10-CM | POA: Insufficient documentation

## 2019-09-20 DIAGNOSIS — R112 Nausea with vomiting, unspecified: Secondary | ICD-10-CM | POA: Diagnosis not present

## 2019-09-20 DIAGNOSIS — M797 Fibromyalgia: Secondary | ICD-10-CM | POA: Diagnosis not present

## 2019-09-20 DIAGNOSIS — Z794 Long term (current) use of insulin: Secondary | ICD-10-CM | POA: Insufficient documentation

## 2019-09-20 DIAGNOSIS — Z79899 Other long term (current) drug therapy: Secondary | ICD-10-CM | POA: Insufficient documentation

## 2019-09-20 LAB — COMPREHENSIVE METABOLIC PANEL
ALT: 12 U/L (ref 0–44)
AST: 15 U/L (ref 15–41)
Albumin: 4.6 g/dL (ref 3.5–5.0)
Alkaline Phosphatase: 96 U/L (ref 38–126)
Anion gap: 14 (ref 5–15)
BUN: 18 mg/dL (ref 8–23)
CO2: 25 mmol/L (ref 22–32)
Calcium: 9.4 mg/dL (ref 8.9–10.3)
Chloride: 104 mmol/L (ref 98–111)
Creatinine, Ser: 0.88 mg/dL (ref 0.44–1.00)
GFR calc Af Amer: >60 mL/min (ref >60)
GFR calc non Af Amer: >60 mL/min (ref >60)
Glucose, Bld: 124 mg/dL — ABNORMAL HIGH (ref 70–99)
Potassium: 3.3 mmol/L — ABNORMAL LOW (ref 3.5–5.1)
Sodium: 143 mmol/L (ref 135–145)
Total Bilirubin: 1 mg/dL (ref 0.3–1.2)
Total Protein: 7.8 g/dL (ref 6.5–8.1)

## 2019-09-20 LAB — CBC
HCT: 44.1 % (ref 36.0–46.0)
Hemoglobin: 14.4 g/dL (ref 12.0–15.0)
MCH: 27.9 pg (ref 26.0–34.0)
MCHC: 32.7 g/dL (ref 30.0–36.0)
MCV: 85.3 fL (ref 80.0–100.0)
Platelets: 299 10*3/uL (ref 150–400)
RBC: 5.17 MIL/uL — ABNORMAL HIGH (ref 3.87–5.11)
RDW: 15 % (ref 11.5–15.5)
WBC: 7.7 10*3/uL (ref 4.0–10.5)
nRBC: 0 % (ref 0.0–0.2)

## 2019-09-20 LAB — URINALYSIS, ROUTINE W REFLEX MICROSCOPIC
Bilirubin Urine: NEGATIVE
Glucose, UA: >=500 mg/dL — AB
Hgb urine dipstick: NEGATIVE
Ketones, ur: 20 mg/dL — AB
Leukocytes,Ua: NEGATIVE
Nitrite: NEGATIVE
Protein, ur: 100 mg/dL — AB
Specific Gravity, Urine: 1.015 (ref 1.005–1.030)
pH: 6 (ref 5.0–8.0)

## 2019-09-20 LAB — LIPASE, BLOOD: Lipase: 27 U/L (ref 11–51)

## 2019-09-20 MED ORDER — ONDANSETRON HCL 4 MG/2ML IJ SOLN: 4.0000 mg | Freq: Once | INTRAMUSCULAR | Status: DC

## 2019-09-20 MED ORDER — ONDANSETRON 4 MG PO TBDP
4.0000 mg | ORAL_TABLET | Freq: Once | ORAL | Status: AC
  Administered 2019-09-20: 4 mg via ORAL
  Filled 2019-09-20: qty 1

## 2019-09-20 MED ORDER — SODIUM CHLORIDE 0.9% FLUSH: 3.0000 mL | Freq: Once | INTRAVENOUS | Status: DC

## 2019-09-20 MED ORDER — HYDROXYZINE HCL 25 MG PO TABS: 25.0000 mg | ORAL_TABLET | Freq: Four times a day (QID) | ORAL | 0 refills | Status: DC | PRN

## 2019-09-20 NOTE — ED Provider Notes (Signed)
[facility] DEPT Provider Note   CSN: [id number] Arrival date & time: 09/20/19  1458     History Chief Complaint  Patient presents with  . Nausea  . Emesis    [name] is a 79 y.o. female.  The history is provided by the patient and medical records. No language interpreter was used.  Emesis    79 year old female with history of diabetes, hypertension, hyperlipidemia, fibromyalgias, presenting complaint of nausea and vomiting.  Patient report for the past 4 to 5 days she has had nausea and occasional nonbloody nonbilious vomiting.  States she feels recurrent nausea since her knee surgery approximately 5 weeks ago.  She initially felt the nausea is due to taking opiate pain medication but she has not been taking that medication for the past several days but the nausea still persist.  She has an appetite but afraid to eat.  She does have some antinausea medication at home.  She felt nausea could also be related to increasing stress because she lives at home by herself and having to do with her knee surgery and the need to find a nursing home to stay.  States that her family sometimes stresses her out.  She does not complain of any fever, chest pain, trouble breathing, productive cough, worsening knee pain numbness or weakness.  She has been taking her medication for the past 2 days due to nausea.  She is up-to-date with her Covid vaccination.  She does not complain of any abdominal pain.  Past Medical History:  Diagnosis Date  . Acute blood loss anemia   . Acute encephalopathy 07/15/2016  . AKI (acute kidney injury) ([place])   . Anemia    has sickle cell trait  . Anxiety   . Arthritis   . Asthma    has used inhaler in past for asthmatic bronchitis, last time- early 2012  . Bilateral primary osteoarthritis of knee 03/26/2016  . Complication of anesthesia    wakes up shaking  . Diabetes mellitus   . Diabetes mellitus with neuropathy ([place]) 10/29/2012    . Diverticulitis   . Dyslipidemia   . Encephalopathy 06/2016   due to medications after surgery  . Fibromyalgia   . GERD (gastroesophageal reflux disease)    occas. use of  Prilosec  . Heart murmur    sees Dr. Montez Morita, last seen- early 2012  . Herniated nucleus pulposus, L2-3 06/25/2016  . History of back surgery   . Hypertension    02/2010- stress test /w PCP  . Hypothyroidism   . Loose bowel movements 12/2016  . Lumbar radiculopathy 06/27/2016  . Lumbar stenosis with neurogenic claudication 03/17/2017  . Memory disorder 02/16/2016  . Myoclonic jerking   . Neuromuscular disorder (H[facility])    lumbar radiculopathy, lumbago  . Nocturnal leg cramps 09/27/2014  . Osteoporosis 03/10/2014  . Pneumonia   . Sickle cell trait ([place])   . Sleep apnea    borderline sleep apnea, states she no longer uses, early 2012- stopped using   . Spinal stenosis of lumbar region with radiculopathy 02/28/2014  . Spondylolisthesis of lumbar region 03/19/2011  . Type II or unspecified type diabetes mellitus without mention of complication, uncontrolled 07/08/2013    Patient Active Problem List   Diagnosis Date Noted  . Status post total right knee replacement 08/13/2019  . Unilateral primary osteoarthritis, right knee 05/24/2019  . Fracture, proximal femur ([place]) 06/12/2018  . Femur fracture, left ([place]) 06/12/2018  . Acute blood loss as  cause of postoperative anemia 03/30/2017  . Diabetic polyneuropathy associated with secondary diabetes mellitus ([place]) 03/30/2017  . Asthma 03/30/2017  . Dyslipidemia 03/26/2017  . Lumbar stenosis with neurogenic claudication 03/17/2017  . Spinal stenosis of lumbar region 03/03/2017  . Myoclonic jerking   . Nausea   . Acute encephalopathy 07/15/2016  . Lumbar radiculopathy 06/27/2016  . Hypothyroidism 06/26/2016  . Labile blood glucose   . Surgery, elective   . Post-operative pain   . Sickle cell trait ([place])   . Acute blood loss anemia   . History of back surgery   .  AKI (acute kidney injury) ([place])   . Herniated nucleus pulposus, L2-3 06/25/2016  . Bilateral primary osteoarthritis of knee 03/26/2016  . Osteoarthritis, hand 03/26/2016  . Other insomnia 03/26/2016  . Fibromyalgia 09/27/2014  . Nocturnal leg cramps 09/27/2014  . Body mass index (BMI) of 30.0-30.9 in adult 08/04/2014  . Neuropathy 03/10/2014  . Allergic rhinitis 03/10/2014  . Osteoporosis 03/10/2014  . Spinal stenosis of lumbar region with radiculopathy 02/28/2014  . Type 2 diabetes mellitus with complication, with long-term current use of insulin ([place]) 07/08/2013  . Hypokalemia 11/02/2012  . Essential hypertension, benign 11/02/2012  . Diabetes mellitus with neuropathy ([place]) 10/29/2012  . Hyperlipidemia 10/29/2012  . Spondylolisthesis of lumbar region 03/19/2011  . Lumbar radicular pain 03/19/2011    Past Surgical History:  Procedure Laterality Date  . ABDOMINAL HYSTERECTOMY    . adb.cyst     ovarian cyst  . BACK SURGERY     2012, 2015 (3 total)  . BACK SURGERY  2018   02/24/2017  . COLONOSCOPY    . EYE SURGERY     macular degeneration treatment - injections  . FEMUR IM NAIL Left 06/13/2018   Procedure: INTRAMEDULLARY (IM) NAIL FEMORAL;  Surgeon: [name], MD;  Location: [facility];  Service: Orthopedics;  Laterality: Left;  . OVARIAN CYST SURGERY    . POSTERIOR LUMBAR FUSION 4 LEVEL N/A 03/17/2017   Procedure: Decompression of Lumbar One-Two with Thoracic Ten to Lumbar Two Fusion;  Surgeon: Kristeen Miss, MD;  Location: [facility];  Service: Neurosurgery;  Laterality: N/A;  Decompression of L1-2 with T10 to L2 Fusion  . TOTAL KNEE ARTHROPLASTY Right 08/13/2019   Procedure: RIGHT TOTAL KNEE ARTHROPLASTY;  Surgeon: [name], MD;  Location: [facility];  Service: Orthopedics;  Laterality: Right;     OB History   No obstetric history on file.     Family History  Problem Relation Age of Onset  . Ovarian cancer Mother   . Cancer - Prostate Father   . Breast  cancer Paternal Aunt   . Multiple myeloma Paternal Aunt   . Anesthesia problems Neg Hx   . Hypotension Neg Hx   . Malignant hyperthermia Neg Hx   . Pseudochol deficiency Neg Hx     Social History   Tobacco Use  . Smoking status: Former Smoker    Packs/day: 0.10    Years: 30.00    Pack years: 3.00    Types: Cigarettes    Quit date: 2005    Years since quitting: 16.4  . Smokeless tobacco: Never Used  Substance Use Topics  . Alcohol use: Yes    Comment: wine /w dinner on occas.  . Drug use: Never    Home Medications Prior to Admission medications   Medication Sig Start Date End Date Taking? Authorizing Provider  acetaminophen (TYLENOL) 500 MG tablet Take 1,000 mg by mouth every 8 (eight) hours  as needed for mild pain or moderate pain.     [provider]  aspirin 81 MG chewable tablet Chew 1 tablet (81 mg total) by mouth 2 (two) times daily. 08/14/19   [name], MD  Blood Glucose Monitoring Suppl (ACCU-CHEK AVIVA PLUS) w/Device KIT Use Accu Chek Aviva as instructed to check blood sugar 4 times daily. 05/03/19   [name], MD  BYSTOLIC 5 MG tablet Take 5 mg by mouth daily. 07/09/19   [provider]  cetirizine (ZYRTEC) 10 MG tablet Take 1 tablet (10 mg total) by mouth daily as needed for allergies. 07/03/18   Hennie Duos, MD  dapagliflozin propanediol (FARXIGA) 5 MG TABS tablet Take 1 tablet by mouth once daily. DX:E11.65 Patient taking differently: Take 5 mg by mouth every morning. Take 1 tablet by mouth once daily. DX:E11.65 05/03/19   [name], MD  Evolocumab (REPATHA SURECLICK) 466 MG/ML SOAJ Inject contents of pen in stomach area every 2 weeks 09/16/19   [name], MD  fluticasone [facility]) 50 MCG/ACT nasal spray Place 1 spray into both nostrils daily as needed for allergies. Patient taking differently: Place 2 sprays into both nostrils 2 (two) times daily as needed for allergies.  07/03/18   Hennie Duos, MD  gabapentin (NEURONTIN)  600 MG tablet TAKE 1 TABLET(600 MG) BY MOUTH THREE TIMES DAILY Patient taking differently: Take 600 mg by mouth 3 (three) times daily.  10/19/18   Kathrynn Ducking, MD  glucose blood test strip Use Accu Chek Aviva test strips as instructed to check blood sugar 4 times daily. 05/03/19   [name], MD  HYDROcodone-acetaminophen (NORCO) 5-325 MG tablet Take 1-2 tablets by mouth every 6 (six) hours as needed for moderate pain. 09/14/19   [name], MD  ibuprofen (ADVIL) 800 MG tablet Take 1 tablet (800 mg total) by mouth every 8 (eight) hours as needed. Patient taking differently: Take 800 mg by mouth every 8 (eight) hours as needed for moderate pain.  10/22/18   [name], MD  insulin aspart (NOVOLOG FLEXPEN) 100 UNIT/ML FlexPen Inject 6 units under the skin before breakfast, 9 units at lunch, and 8-10 units at dinner. 3-4 PRN for large carbs or ice cream. DX:E11.65 09/01/19   [name], MD  insulin degludec (TRESIBA FLEXTOUCH) 100 UNIT/ML FlexTouch Pen INJECT 22 UNITS UNDER THE SKIN EVERY DAY 09/01/19   [name], MD  Insulin Pen Needle (BD PEN NEEDLE NANO U/F) 32G X 4 MM MISC Use to inject insulin 4 times daily. 07/05/19   [name], MD  irbesartan (AVAPRO) 300 MG tablet Take 300 mg by mouth daily.  07/15/18   [provider]  ketoconazole (NIZORAL) 2 % cream Apply 1 application topically See admin instructions. For 14 days for irritation when needed    [provider]  levothyroxine (SYNTHROID) 50 MCG tablet TAKE 1 TABLET(50 MCG) BY MOUTH DAILY Patient taking differently: Take 50 mcg by mouth daily before breakfast.  05/21/19   [name], MD  MAGNESIUM PO Take 250 mg by mouth 2 (two) times daily.     [provider]  meloxicam (MOBIC) 15 MG tablet Take 15 mg by mouth daily.  08/01/18   [provider]  methocarbamol (ROBAXIN) 500 MG tablet Take 1 tablet (500 mg total) by mouth every 6 (six) hours as needed for muscle spasms. 08/14/19    [name], MD  Multiple Vitamins-Minerals (MULTIVITAMIN WITH MINERALS) tablet Take 1 tablet by mouth daily.  [provider]  Multiple Vitamins-Minerals (OCUVITE ADULT FORMULA PO) Take 1 tablet by mouth daily.    [provider]  ondansetron (ZOFRAN ODT) 4 MG disintegrating tablet Take 1 tablet (4 mg total) by mouth every 8 (eight) hours as needed for nausea or vomiting. 08/23/19   [name], MD  potassium chloride (KLOR-CON) 10 MEQ tablet Take 10 mEq by mouth daily as needed (low potassium).    [provider]  SYMBICORT 80-4.5 MCG/ACT inhaler Inhale 2 puffs into the lungs 2 (two) times daily as needed (shortness of breath). 07/03/18   Hennie Duos, MD  zinc gluconate 50 MG tablet Take 50 mg by mouth daily.    [provider]    Allergies    Cymbalta [duloxetine hcl], Baclofen, Betadine [povidone iodine], Percocet [oxycodone-acetaminophen], Adhesive [tape], and Lipitor [atorvastatin]  Review of Systems   Review of Systems  Gastrointestinal: Positive for vomiting.  All other systems reviewed and are negative.   Physical Exam Updated Vital Signs BP (!) 177/88   Pulse 80   Temp 99.2 F (37.3 C) (Oral)   Resp 16   LMP  (LMP Unknown)   SpO2 95%   Physical Exam Vitals and nursing note reviewed.  Constitutional:      General: She is not in acute distress.    Appearance: She is well-developed.     Comments: Elderly female laying in bed in no acute discomfort.  HENT:     Head: Atraumatic.  Eyes:     Conjunctiva/sclera: Conjunctivae normal.  Cardiovascular:     Rate and Rhythm: Normal rate and regular rhythm.     Pulses: Normal pulses.     Heart sounds: Normal heart sounds.  Pulmonary:     Effort: Pulmonary effort is normal.     Breath sounds: Normal breath sounds. No wheezing or rhonchi.  Abdominal:     General: Bowel sounds are normal.     Palpations: Abdomen is soft.     Tenderness: There is no abdominal  tenderness.  Musculoskeletal:     Cervical back: Neck supple.  Skin:    Findings: No rash.  Neurological:     Mental Status: She is alert and oriented to person, place, and time.  Psychiatric:        Mood and Affect: Mood normal.     ED Results / Procedures / Treatments   Labs (all labs ordered are listed, but only abnormal results are displayed) Labs Reviewed  COMPREHENSIVE METABOLIC PANEL - Abnormal; Notable for the following components:      Result Value   Potassium 3.3 (*)    Glucose, Bld 124 (*)    All other components within normal limits  CBC - Abnormal; Notable for the following components:   RBC 5.17 (*)    All other components within normal limits  URINALYSIS, ROUTINE W REFLEX MICROSCOPIC - Abnormal; Notable for the following components:   Glucose, UA >=500 (*)    Ketones, ur 20 (*)    Protein, ur 100 (*)    Bacteria, UA FEW (*)    All other components within normal limits  LIPASE, BLOOD    EKG EKG Interpretation  Date/Time:  Monday Sep 20 2019 16:35:12 EDT Ventricular Rate:  74 PR Interval:    QRS Duration: 108 QT Interval:  383 QTC Calculation: 425 R Axis:   61 Text Interpretation: Sinus rhythm No significant change since last tracing Confirmed by Dorie Rank [phone]) on 09/20/2019 4:41:03 PM    Date: 09/20/2019  Rate: 74  Rhythm: normal sinus rhythm  QRS Axis: normal  Intervals: normal  ST/T Wave abnormalities: normal  Conduction Disutrbances: none  Narrative Interpretation:   Old EKG Reviewed: No significant changes noted EKG reviewed and interpreted by me     Radiology DG ABD ACUTE 2+V W 1V CHEST  Result Date: 09/20/2019 CLINICAL DATA:  Patient here from home with complaints of nausea vomiting since knee surgery last month. Reports that "I think its the hydrocodone they prescribed me". hx DM, HTN; EXAM: DG ABDOMEN ACUTE W/ 1V CHEST COMPARISON:  Chest radiograph 12/01/2018, FINDINGS: There is no evidence of dilated bowel loops or free  intraperitoneal air. No radiopaque calculi. Extensive thoracolumbar hardware and orthopedic fixation of the left proximal femur. Heart size and mediastinal contours are within normal limits. Minimal scarring at the right lung base. Otherwise lungs are clear. No pneumothorax or pleural effusion. IMPRESSION: Nonobstructive bowel gas pattern.  No acute cardiopulmonary disease. Electronically Signed   By: Audie Pinto M.D.   On: 09/20/2019 18:00    Procedures Procedures (including critical care time)  Medications Ordered in ED Medications  sodium chloride flush (NS) 0.9 % injection 3 mL (3 mLs Intravenous Not Given 09/20/19 1534)  ondansetron (ZOFRAN-ODT) disintegrating tablet 4 mg (4 mg Oral Given 09/20/19 1656)    ED Course  I have reviewed the triage vital signs and the nursing notes.  Pertinent labs & imaging results that were available during my care of the patient were reviewed by me and considered in my medical decision making (see chart for details).    MDM Rules/Calculators/A&P                      BP (!) 177/88   Pulse 80   Temp 99.2 F (37.3 C) (Oral)   Resp 16   LMP  (LMP Unknown)   SpO2 95%   Final Clinical Impression(s) / ED Diagnoses Final diagnoses:  Non-intractable vomiting with nausea, unspecified vomiting type    Rx / DC Orders ED Discharge Orders         Ordered    hydrOXYzine (ATARAX/VISTARIL) 25 MG tablet  Every 6 hours PRN     09/20/19 2031         4:36 PM Patient report recurrent episodes of nausea and occasional nonbloody nonbilious vomiting for nearly a week but states nausea hospitalist been longer than that to approximately 5 weeks since right knee surgery.  She is also has history of diabetes and on multiple antidiabetic medication.  Questionable history of gastroparesis.  She has a benign abdominal exam no suspicion for gallbladder etiology, diverticulitis, or small bowel obstruction given the prolonged course of her symptoms and a benign  abdominal exam.  She is found to be hypertensive with a blood pressure of 181/86 likely secondary to noncompliant with her antihypertensive medication.  She is afebrile.  Work-up initiated, will provide symptomatic treatment, will assess p.o. status.  Pt does have mild warmth to R knee, with previous knee surgery but denies worsening pain and she can range her knee.  I discussed care with Dr. Tomi Bamberger.  Felt joint infection less likely.  Will perform acute abdominal series to ensure no evidence of SBO.    8:14 PM Acute abdominal series without acute finding.  Normal lipase, electrolytes reassuring, mild hypokalemia with a potassium of 3.3.  Normal WBC, normal H&H.  EKG unremarkable.  UA is currently pending.  8:26 PM UA normal.  PT tolerates PO.  Stable for  discharge.  Care discussed with DR. Knapp.     Domenic Moras, PA-C 09/20/19 2033    Dorie Rank, MD 09/21/19 [phone]

## 2019-09-20 NOTE — ED Notes (Signed)
Pt a/o and oriented at discharge. Pt able to standing and walk to wheelchair. Pt called daughter for ride. PT verbalized prescription information and discharge instructions.

## 2019-09-20 NOTE — Progress Notes (Signed)
CSW spoke with [facility] RN CM who is following up with the patient for home health.  CSW will continue to follow for D/C needs.  Alphonse Guild. Jailee Jaquez  MSW, LCSW, LCAS, CCS Transitions of Care Clinical Social Worker Care Coordination Department Ph: [phone]

## 2019-09-20 NOTE — Discharge Instructions (Signed)
Please take vistaril as needed for anxiety or nausea.  Call and follow up with your doctor for further care.  Your labs are normal today.  No evidence of bowel blockage that can cause nausea.  Your potassium level is mildly low, eat a banana daily for 1 week and have it recheck.  Follow up with your orthopedist for further management of your recent knee surgery.

## 2019-09-20 NOTE — ED Triage Notes (Signed)
Patient here from home with complaints of nausea vomiting since knee surgery last month. Reports that "I think its the hydrocodone they prescribed me".

## 2019-09-21 ENCOUNTER — Ambulatory Visit: Payer: Medicare PPO | Admitting: Physical Therapy

## 2019-09-23 ENCOUNTER — Encounter: Payer: Self-pay | Admitting: Orthopaedic Surgery

## 2019-09-23 ENCOUNTER — Ambulatory Visit (INDEPENDENT_AMBULATORY_CARE_PROVIDER_SITE_OTHER): Payer: Medicare PPO | Admitting: Orthopaedic Surgery

## 2019-09-23 ENCOUNTER — Other Ambulatory Visit: Payer: Self-pay

## 2019-09-23 DIAGNOSIS — Z96651 Presence of right artificial knee joint: Secondary | ICD-10-CM

## 2019-09-23 NOTE — Progress Notes (Signed)
[name] is now 6-week status post a right total knee arthroplasty.  She ambulates with a cane at home but she is a walker today.  She states that she is not really taking hydrocodone much at all because it makes her stomach not feel well.  She is also not sleeping well at night.  On exam her extension is almost full right knee and her flexion is well past 90 degrees.  The knee feels ligamentously stable.  The swelling is only mild.  Overall she looks great and I gave her reassurance that she is now ahead of things from what most people are at 6 weeks.  She can drop from my standpoint she is she is not taking narcotics anytime during the day.  She will continue aggressive therapy.  I gave her reassurance that as time goes by this will get better.  All questions and concerns were answered and addressed.  I would like to see her back in 4 weeks to see how she is doing overall but no x-rays are needed.

## 2019-09-24 ENCOUNTER — Encounter: Payer: Medicare PPO | Admitting: Physical Therapy

## 2019-09-28 ENCOUNTER — Other Ambulatory Visit: Payer: Self-pay

## 2019-09-28 ENCOUNTER — Ambulatory Visit: Payer: Medicare PPO | Attending: Orthopaedic Surgery | Admitting: Physical Therapy

## 2019-09-28 ENCOUNTER — Encounter: Payer: Self-pay | Admitting: Physical Therapy

## 2019-09-28 DIAGNOSIS — M25561 Pain in right knee: Secondary | ICD-10-CM | POA: Diagnosis not present

## 2019-09-28 DIAGNOSIS — M25661 Stiffness of right knee, not elsewhere classified: Secondary | ICD-10-CM | POA: Diagnosis not present

## 2019-09-28 DIAGNOSIS — R262 Difficulty in walking, not elsewhere classified: Secondary | ICD-10-CM | POA: Insufficient documentation

## 2019-09-28 DIAGNOSIS — R600 Localized edema: Secondary | ICD-10-CM | POA: Insufficient documentation

## 2019-09-28 NOTE — Therapy (Signed)
[place], [place], [address] Phone: [phone]   Fax:  [phone]  Physical Therapy Treatment  Patient Details  Name: [name] MRN: [id number] Date of Birth: 1940/06/03 Referring Provider (PT): Ninfa Linden   Encounter Date: 09/28/2019  PT End of Session - 09/28/19 1510    Visit Number  5    Date for PT Re-Evaluation  10/31/19    Authorization Type  Medicare    PT Start Time  1444    PT Stop Time  1520    PT Time Calculation (min)  36 min    Activity Tolerance  Patient limited by pain    Behavior During Therapy  [facility] for tasks assessed/performed       Past Medical History:  Diagnosis Date  . Acute blood loss anemia   . Acute encephalopathy 07/15/2016  . AKI (acute kidney injury) ([place])   . Anemia    has sickle cell trait  . Anxiety   . Arthritis   . Asthma    has used inhaler in past for asthmatic bronchitis, last time- early 2012  . Bilateral primary osteoarthritis of knee 03/26/2016  . Complication of anesthesia    wakes up shaking  . Diabetes mellitus   . Diabetes mellitus with neuropathy ([place]) 10/29/2012  . Diverticulitis   . Dyslipidemia   . Encephalopathy 06/2016   due to medications after surgery  . Fibromyalgia   . GERD (gastroesophageal reflux disease)    occas. use of  Prilosec  . Heart murmur    sees Dr. Montez Morita, last seen- early 2012  . Herniated nucleus pulposus, L2-3 06/25/2016  . History of back surgery   . Hypertension    02/2010- stress test /w PCP  . Hypothyroidism   . Loose bowel movements 12/2016  . Lumbar radiculopathy 06/27/2016  . Lumbar stenosis with neurogenic claudication 03/17/2017  . Memory disorder 02/16/2016  . Myoclonic jerking   . Neuromuscular disorder (HCC)    lumbar radiculopathy, lumbago  . Nocturnal leg cramps 09/27/2014  . Osteoporosis 03/10/2014  . Pneumonia   . Sickle cell trait ([place])   . Sleep apnea    borderline sleep apnea,  states she no longer uses, early 2012- stopped using   . Spinal stenosis of lumbar region with radiculopathy 02/28/2014  . Spondylolisthesis of lumbar region 03/19/2011  . Type II or unspecified type diabetes mellitus without mention of complication, uncontrolled 07/08/2013    Past Surgical History:  Procedure Laterality Date  . ABDOMINAL HYSTERECTOMY    . adb.cyst     ovarian cyst  . BACK SURGERY     2012, 2015 (3 total)  . BACK SURGERY  2018   02/24/2017  . COLONOSCOPY    . EYE SURGERY     macular degeneration treatment - injections  . FEMUR IM NAIL Left 06/13/2018   Procedure: INTRAMEDULLARY (IM) NAIL FEMORAL;  Surgeon: [name], MD;  Location: [facility];  Service: Orthopedics;  Laterality: Left;  . OVARIAN CYST SURGERY    . POSTERIOR LUMBAR FUSION 4 LEVEL N/A 03/17/2017   Procedure: Decompression of Lumbar One-Two with Thoracic Ten to Lumbar Two Fusion;  Surgeon: Kristeen Miss, MD;  Location: [facility];  Service: Neurosurgery;  Laterality: N/A;  Decompression of L1-2 with T10 to L2 Fusion  . TOTAL KNEE ARTHROPLASTY Right 08/13/2019   Procedure: RIGHT TOTAL KNEE ARTHROPLASTY;  Surgeon: [name], MD;  Location: [facility];  Service: Orthopedics;  Laterality:  Right;    There were no vitals filed for this visit.  Subjective Assessment - 09/28/19 1448    Subjective  "I hurt", "It hurt", "This knee hurts"    Currently in Pain?  Yes    Pain Score  8     Pain Location  Knee    Pain Orientation  Right                        [facility] Adult PT Treatment/Exercise - 09/28/19 0001      Knee/Hip Exercises: Aerobic   Nustep  level 3 x 6 minutes      Knee/Hip Exercises: Seated   Long Arc Quad  Right;2 sets;10 reps    Hamstring Curl  Strengthening;10 reps;1 set    Hamstring Limitations  red tband       Vasopneumatic   Number Minutes Vasopneumatic   10 minutes    Vasopnuematic Location   Knee    Vasopneumatic Pressure  Low    Vasopneumatic Temperature    37      Manual Therapy   Manual Therapy  Passive ROM    Passive ROM  R knee flex and ext with some end range hold               PT Short Term Goals - 09/13/19 1516      PT SHORT TERM GOAL #1   Title  independent with initial HEP    Status  Achieved        PT Long Term Goals - 09/15/19 1605      PT LONG TERM GOAL #1   Title  decrease pain 50%    Status  On-going      PT LONG TERM GOAL #2   Title  get up from sitting without pain and without hands    Status  On-going      PT LONG TERM GOAL #3   Title  increase AROM of the right knee to 5-116 degrees flexion    Status  On-going            Plan - 09/28/19 1511    Clinical Impression Statement  Pt 14 minutes late today. No progression towards goals. Pt reports increase pain today's unable to finish any exercises interventions. Hamstring curls cause anterior shin pain. Assist needed to complete LAQ without resistance. Good PROM with some end range pain    Comorbidities  DJD right knee, 5 lumbar surgeries, osteoporosis    Examination-Activity Limitations  Bathing;Squat;Carry;Stairs;Stand;Toileting;Transfers;Lift;Locomotion Level    Examination-Participation Restrictions  Cleaning;Meal Prep;Community Activity;Driving;Laundry;Yard Work    [employer]chant [employer] officer  Evolving/Moderate complexity    Rehab Potential  Good    PT Frequency  3x / week    PT Duration  8 weeks    PT Treatment/Interventions  ADLs/Self Care Home Management;Electrical Stimulation;Moist Heat;Functional mobility training;Stair training;Gait training;Therapeutic activities;Therapeutic exercise;Balance training;Neuromuscular re-education;Manual techniques;Patient/family education;Cryotherapy;Dry needling;Ultrasound;Vasopneumatic Device    PT Next Visit Plan  ROM, gait and function       Patient will benefit from skilled therapeutic intervention in order to improve the following deficits and impairments:  Pain, Increased muscle spasms,  Cardiopulmonary status limiting activity, Decreased activity tolerance, Decreased endurance, Decreased range of motion, Decreased strength, Decreased balance, Difficulty walking, Impaired flexibility, Improper body mechanics, Abnormal gait, Increased edema, Decreased mobility  Visit Diagnosis: Stiffness of right knee, not elsewhere classified  Localized edema  Acute pain of right knee     Problem List Patient Active Problem List  Diagnosis Date Noted  . Status post total right knee replacement 08/13/2019  . Unilateral primary osteoarthritis, right knee 05/24/2019  . Fracture, proximal femur ([place]) 06/12/2018  . Femur fracture, left ([place]) 06/12/2018  . Acute blood loss as cause of postoperative anemia 03/30/2017  . Diabetic polyneuropathy associated with secondary diabetes mellitus ([place]) 03/30/2017  . Asthma 03/30/2017  . Dyslipidemia 03/26/2017  . Lumbar stenosis with neurogenic claudication 03/17/2017  . Spinal stenosis of lumbar region 03/03/2017  . Myoclonic jerking   . Nausea   . Acute encephalopathy 07/15/2016  . Lumbar radiculopathy 06/27/2016  . Hypothyroidism 06/26/2016  . Labile blood glucose   . Surgery, elective   . Post-operative pain   . Sickle cell trait ([place])   . Acute blood loss anemia   . History of back surgery   . AKI (acute kidney injury) ([place])   . Herniated nucleus pulposus, L2-3 06/25/2016  . Bilateral primary osteoarthritis of knee 03/26/2016  . Osteoarthritis, hand 03/26/2016  . Other insomnia 03/26/2016  . Fibromyalgia 09/27/2014  . Nocturnal leg cramps 09/27/2014  . Body mass index (BMI) of 30.0-30.9 in adult 08/04/2014  . Neuropathy 03/10/2014  . Allergic rhinitis 03/10/2014  . Osteoporosis 03/10/2014  . Spinal stenosis of lumbar region with radiculopathy 02/28/2014  . Type 2 diabetes mellitus with complication, with long-term current use of insulin ([place]) 07/08/2013  . Hypokalemia 11/02/2012  . Essential hypertension, benign 11/02/2012  .  Diabetes mellitus with neuropathy ([place]) 10/29/2012  . Hyperlipidemia 10/29/2012  . Spondylolisthesis of lumbar region 03/19/2011  . Lumbar radicular pain 03/19/2011    Scot Jun, PTA 09/28/2019, 3:13 PM  [place], [place], [address] Phone: [phone]   Fax:  [phone]  Name: DEMISHA NOKES MRN: [id number] Date of Birth: 1940/12/12

## 2019-09-29 DIAGNOSIS — Z961 Presence of intraocular lens: Secondary | ICD-10-CM | POA: Diagnosis not present

## 2019-09-29 DIAGNOSIS — E119 Type 2 diabetes mellitus without complications: Secondary | ICD-10-CM | POA: Diagnosis not present

## 2019-09-29 DIAGNOSIS — H524 Presbyopia: Secondary | ICD-10-CM | POA: Diagnosis not present

## 2019-09-29 DIAGNOSIS — Z794 Long term (current) use of insulin: Secondary | ICD-10-CM | POA: Diagnosis not present

## 2019-09-29 DIAGNOSIS — H31011 Macula scars of posterior pole (postinflammatory) (post-traumatic), right eye: Secondary | ICD-10-CM | POA: Diagnosis not present

## 2019-09-29 LAB — HM DIABETES EYE EXAM

## 2019-09-30 ENCOUNTER — Ambulatory Visit: Payer: Medicare PPO | Admitting: Physical Therapy

## 2019-09-30 ENCOUNTER — Other Ambulatory Visit: Payer: Self-pay

## 2019-09-30 ENCOUNTER — Encounter: Payer: Self-pay | Admitting: Physical Therapy

## 2019-09-30 DIAGNOSIS — R600 Localized edema: Secondary | ICD-10-CM

## 2019-09-30 DIAGNOSIS — R262 Difficulty in walking, not elsewhere classified: Secondary | ICD-10-CM | POA: Diagnosis not present

## 2019-09-30 DIAGNOSIS — M25661 Stiffness of right knee, not elsewhere classified: Secondary | ICD-10-CM

## 2019-09-30 DIAGNOSIS — M25561 Pain in right knee: Secondary | ICD-10-CM | POA: Diagnosis not present

## 2019-09-30 NOTE — Therapy (Signed)
[place], [place], [address] Phone: [phone]   Fax:  [phone]  Physical Therapy Treatment  Patient Details  Name: [name] MRN: [id number] Date of Birth: 13-Oct-1940 Referring Provider (PT): Ninfa Linden   Encounter Date: 09/30/2019   PT End of Session - 09/30/19 1515    Visit Number 6    Date for PT Re-Evaluation 10/31/19    Authorization Type Medicare    PT Start Time 1440    PT Stop Time 1523    PT Time Calculation (min) 43 min    Activity Tolerance Patient tolerated treatment well    Behavior During Therapy [facility] for tasks assessed/performed           Past Medical History:  Diagnosis Date  . Acute blood loss anemia   . Acute encephalopathy 07/15/2016  . AKI (acute kidney injury) ([place])   . Anemia    has sickle cell trait  . Anxiety   . Arthritis   . Asthma    has used inhaler in past for asthmatic bronchitis, last time- early 2012  . Bilateral primary osteoarthritis of knee 03/26/2016  . Complication of anesthesia    wakes up shaking  . Diabetes mellitus   . Diabetes mellitus with neuropathy ([place]) 10/29/2012  . Diverticulitis   . Dyslipidemia   . Encephalopathy 06/2016   due to medications after surgery  . Fibromyalgia   . GERD (gastroesophageal reflux disease)    occas. use of  Prilosec  . Heart murmur    sees Dr. Montez Morita, last seen- early 2012  . Herniated nucleus pulposus, L2-3 06/25/2016  . History of back surgery   . Hypertension    02/2010- stress test /w PCP  . Hypothyroidism   . Loose bowel movements 12/2016  . Lumbar radiculopathy 06/27/2016  . Lumbar stenosis with neurogenic claudication 03/17/2017  . Memory disorder 02/16/2016  . Myoclonic jerking   . Neuromuscular disorder (HCC)    lumbar radiculopathy, lumbago  . Nocturnal leg cramps 09/27/2014  . Osteoporosis 03/10/2014  . Pneumonia   . Sickle cell trait ([place])   . Sleep apnea    borderline sleep  apnea, states she no longer uses, early 2012- stopped using   . Spinal stenosis of lumbar region with radiculopathy 02/28/2014  . Spondylolisthesis of lumbar region 03/19/2011  . Type II or unspecified type diabetes mellitus without mention of complication, uncontrolled 07/08/2013    Past Surgical History:  Procedure Laterality Date  . ABDOMINAL HYSTERECTOMY    . adb.cyst     ovarian cyst  . BACK SURGERY     2012, 2015 (3 total)  . BACK SURGERY  2018   02/24/2017  . COLONOSCOPY    . EYE SURGERY     macular degeneration treatment - injections  . FEMUR IM NAIL Left 06/13/2018   Procedure: INTRAMEDULLARY (IM) NAIL FEMORAL;  Surgeon: [name], MD;  [facility];  Service: Orthopedics;  Laterality: Left;  . OVARIAN CYST SURGERY    . POSTERIOR LUMBAR FUSION 4 LEVEL N/A 03/17/2017   Procedure: Decompression of Lumbar One-Two with Thoracic Ten to Lumbar Two Fusion;  Surgeon: Kristeen Miss, MD;  Location: [facility];  Service: Neurosurgery;  Laterality: N/A;  Decompression of L1-2 with T10 to L2 Fusion  . TOTAL KNEE ARTHROPLASTY Right 08/13/2019   Procedure: RIGHT TOTAL KNEE ARTHROPLASTY;  Surgeon: [name], MD;  [facility];  Service: Orthopedics;  Laterality: Right;  There were no vitals filed for this visit.   Subjective Assessment - 09/30/19 1443    Subjective "I hurt"    Currently in Pain? Yes    Pain Score 7     Pain Location Knee    Pain Orientation Right              [facility] PT Assessment - 09/30/19 0001      AROM   Right Knee Extension 14    Right Knee Flexion 100      PROM   Right Knee Extension 7    Right Knee Flexion 110                         [facility] Adult PT Treatment/Exercise - 09/30/19 0001      Knee/Hip Exercises: Aerobic   Recumbent Bike seat 7 x 2 min full revs     Nustep level 3 x 5 minutes      Knee/Hip Exercises: Seated   Other Seated Knee/Hip Exercises fitter press 2 blue 2x10 one set yellow TKE      Hamstring Curl Strengthening;10 reps;2 sets    Hamstring Limitations red tband     Sit to Sand 1 set;10 reps;without UE support   elevated UBE      Vasopneumatic   Number Minutes Vasopneumatic  10 minutes    Vasopnuematic Location  Knee    Vasopneumatic Pressure Low    Vasopneumatic Temperature  37      Manual Therapy   Manual Therapy Passive ROM    Passive ROM R knee flex and ext with some end range hold                    PT Short Term Goals - 09/13/19 1516      PT SHORT TERM GOAL #1   Title independent with initial HEP    Status Achieved             PT Long Term Goals - 09/15/19 1605      PT LONG TERM GOAL #1   Title decrease pain 50%    Status On-going      PT LONG TERM GOAL #2   Title get up from sitting without pain and without hands    Status On-going      PT LONG TERM GOAL #3   Title increase AROM of the right knee to 5-116 degrees flexion    Status On-going                 Plan - 09/30/19 1517    Clinical Impression Statement Pt did better today compared to last treatment session. She was able to complete all of the interventions despite reports of pain. Little improvement noted with AROM but PROM did improve. Does well with fitter press, reports pain with LAQ and HS curls.    Personal Factors and Comorbidities Comorbidity 3+    Comorbidities DJD right knee, 5 lumbar surgeries, osteoporosis    Examination-Activity Limitations Bathing;Squat;Carry;Stairs;Stand;Toileting;Transfers;Lift;Locomotion Level    Examination-Participation Restrictions Cleaning;Meal Prep;Community Activity;Driving;Laundry;Yard Work    [employer]chant [employer] officer Evolving/Moderate complexity    Rehab Potential Good    PT Frequency 3x / week    PT Duration 8 weeks    PT Treatment/Interventions ADLs/Self Care Home Management;Electrical Stimulation;Moist Heat;Functional mobility training;Stair training;Gait training;Therapeutic activities;Therapeutic  exercise;Balance training;Neuromuscular re-education;Manual techniques;Patient/family education;Cryotherapy;Dry needling;Ultrasound;Vasopneumatic Device    PT Next Visit Plan ROM, gait and function  Patient will benefit from skilled therapeutic intervention in order to improve the following deficits and impairments:  Pain, Increased muscle spasms, Cardiopulmonary status limiting activity, Decreased activity tolerance, Decreased endurance, Decreased range of motion, Decreased strength, Decreased balance, Difficulty walking, Impaired flexibility, Improper body mechanics, Abnormal gait, Increased edema, Decreased mobility  Visit Diagnosis: Stiffness of right knee, not elsewhere classified  Localized edema  Acute pain of right knee  Difficulty in walking, not elsewhere classified     Problem List Patient Active Problem List   Diagnosis Date Noted  . Status post total right knee replacement 08/13/2019  . Unilateral primary osteoarthritis, right knee 05/24/2019  . Fracture, proximal femur ([place]) 06/12/2018  . Femur fracture, left ([place]) 06/12/2018  . Acute blood loss as cause of postoperative anemia 03/30/2017  . Diabetic polyneuropathy associated with secondary diabetes mellitus ([place]) 03/30/2017  . Asthma 03/30/2017  . Dyslipidemia 03/26/2017  . Lumbar stenosis with neurogenic claudication 03/17/2017  . Spinal stenosis of lumbar region 03/03/2017  . Myoclonic jerking   . Nausea   . Acute encephalopathy 07/15/2016  . Lumbar radiculopathy 06/27/2016  . Hypothyroidism 06/26/2016  . Labile blood glucose   . Surgery, elective   . Post-operative pain   . Sickle cell trait ([place])   . Acute blood loss anemia   . History of back surgery   . AKI (acute kidney injury) ([place])   . Herniated nucleus pulposus, L2-3 06/25/2016  . Bilateral primary osteoarthritis of knee 03/26/2016  . Osteoarthritis, hand 03/26/2016  . Other insomnia 03/26/2016  . Fibromyalgia 09/27/2014  .  Nocturnal leg cramps 09/27/2014  . Body mass index (BMI) of 30.0-30.9 in adult 08/04/2014  . Neuropathy 03/10/2014  . Allergic rhinitis 03/10/2014  . Osteoporosis 03/10/2014  . Spinal stenosis of lumbar region with radiculopathy 02/28/2014  . Type 2 diabetes mellitus with complication, with long-term current use of insulin ([place]) 07/08/2013  . Hypokalemia 11/02/2012  . Essential hypertension, benign 11/02/2012  . Diabetes mellitus with neuropathy ([place]) 10/29/2012  . Hyperlipidemia 10/29/2012  . Spondylolisthesis of lumbar region 03/19/2011  . Lumbar radicular pain 03/19/2011    Scot Jun, PTA 09/30/2019, 3:19 PM  [place], [place], [address] Phone: [phone]   Fax:  [phone]  Name: CHYNNA BUERKLE MRN: [id number] Date of Birth: Aug 17, 1940

## 2019-10-05 ENCOUNTER — Ambulatory Visit: Payer: Medicare PPO | Admitting: Physical Therapy

## 2019-10-05 ENCOUNTER — Encounter: Payer: Self-pay | Admitting: Physical Therapy

## 2019-10-05 ENCOUNTER — Other Ambulatory Visit: Payer: Self-pay

## 2019-10-05 DIAGNOSIS — R262 Difficulty in walking, not elsewhere classified: Secondary | ICD-10-CM

## 2019-10-05 DIAGNOSIS — R600 Localized edema: Secondary | ICD-10-CM

## 2019-10-05 DIAGNOSIS — M25561 Pain in right knee: Secondary | ICD-10-CM | POA: Diagnosis not present

## 2019-10-05 DIAGNOSIS — M25661 Stiffness of right knee, not elsewhere classified: Secondary | ICD-10-CM

## 2019-10-05 NOTE — Therapy (Signed)
[place], [place], [address] Phone: [phone]   Fax:  [phone]  Physical Therapy Treatment  Patient Details  Name: [name] MRN: [id number] Date of Birth: 1940-12-24 Referring Provider (PT): Ninfa Linden   Encounter Date: 10/05/2019   PT End of Session - 10/05/19 1519    Visit Number 7    Date for PT Re-Evaluation 10/31/19    Authorization Type Medicare    PT Start Time 1446    PT Stop Time 1520    PT Time Calculation (min) 34 min    Activity Tolerance Patient limited by pain    Behavior During Therapy [facility] for tasks assessed/performed           Past Medical History:  Diagnosis Date  . Acute blood loss anemia   . Acute encephalopathy 07/15/2016  . AKI (acute kidney injury) ([place])   . Anemia    has sickle cell trait  . Anxiety   . Arthritis   . Asthma    has used inhaler in past for asthmatic bronchitis, last time- early 2012  . Bilateral primary osteoarthritis of knee 03/26/2016  . Complication of anesthesia    wakes up shaking  . Diabetes mellitus   . Diabetes mellitus with neuropathy ([place]) 10/29/2012  . Diverticulitis   . Dyslipidemia   . Encephalopathy 06/2016   due to medications after surgery  . Fibromyalgia   . GERD (gastroesophageal reflux disease)    occas. use of  Prilosec  . Heart murmur    sees Dr. Montez Morita, last seen- early 2012  . Herniated nucleus pulposus, L2-3 06/25/2016  . History of back surgery   . Hypertension    02/2010- stress test /w PCP  . Hypothyroidism   . Loose bowel movements 12/2016  . Lumbar radiculopathy 06/27/2016  . Lumbar stenosis with neurogenic claudication 03/17/2017  . Memory disorder 02/16/2016  . Myoclonic jerking   . Neuromuscular disorder (HCC)    lumbar radiculopathy, lumbago  . Nocturnal leg cramps 09/27/2014  . Osteoporosis 03/10/2014  . Pneumonia   . Sickle cell trait ([place])   . Sleep apnea    borderline sleep apnea,  states she no longer uses, early 2012- stopped using   . Spinal stenosis of lumbar region with radiculopathy 02/28/2014  . Spondylolisthesis of lumbar region 03/19/2011  . Type II or unspecified type diabetes mellitus without mention of complication, uncontrolled 07/08/2013    Past Surgical History:  Procedure Laterality Date  . ABDOMINAL HYSTERECTOMY    . adb.cyst     ovarian cyst  . BACK SURGERY     2012, 2015 (3 total)  . BACK SURGERY  2018   02/24/2017  . COLONOSCOPY    . EYE SURGERY     macular degeneration treatment - injections  . FEMUR IM NAIL Left 06/13/2018   Procedure: INTRAMEDULLARY (IM) NAIL FEMORAL;  Surgeon: [name], MD;  Location: [facility];  Service: Orthopedics;  Laterality: Left;  . OVARIAN CYST SURGERY    . POSTERIOR LUMBAR FUSION 4 LEVEL N/A 03/17/2017   Procedure: Decompression of Lumbar One-Two with Thoracic Ten to Lumbar Two Fusion;  Surgeon: Kristeen Miss, MD;  Location: [facility];  Service: Neurosurgery;  Laterality: N/A;  Decompression of L1-2 with T10 to L2 Fusion  . TOTAL KNEE ARTHROPLASTY Right 08/13/2019   Procedure: RIGHT TOTAL KNEE ARTHROPLASTY;  Surgeon: [name], MD;  Location: [facility];  Service: Orthopedics;  Laterality: Right;  There were no vitals filed for this visit.   Subjective Assessment - 10/05/19 1450    Subjective "the same, It does not change"    Currently in Pain? Yes    Pain Score 8     Pain Location Knee    Pain Orientation Right                             [facility] Adult PT Treatment/Exercise - 10/05/19 0001      Ambulation/Gait   Ambulation/Gait Yes    Ambulation/Gait Assistance 5: Supervision    Ambulation/Gait Assistance Details 40    Assistive device None    Gait Pattern Step-through pattern;Decreased arm swing - left;Decreased arm swing - right;Antalgic      Knee/Hip Exercises: Aerobic   Nustep level 4 x 5 minutes      Knee/Hip Exercises: Seated   Long Arc Quad Right;2 sets;10  reps    Hamstring Curl Strengthening;10 reps;2 sets    Hamstring Limitations red tband       Vasopneumatic   Number Minutes Vasopneumatic  10 minutes    Vasopnuematic Location  Knee    Vasopneumatic Pressure Low    Vasopneumatic Temperature  37      Manual Therapy   Manual Therapy Passive ROM    Passive ROM R knee flex and ext with some end range hold                    PT Short Term Goals - 09/13/19 1516      PT SHORT TERM GOAL #1   Title independent with initial HEP    Status Achieved             PT Long Term Goals - 09/15/19 1605      PT LONG TERM GOAL #1   Title decrease pain 50%    Status On-going      PT LONG TERM GOAL #2   Title get up from sitting without pain and without hands    Status On-going      PT LONG TERM GOAL #3   Title increase AROM of the right knee to 5-116 degrees flexion    Status On-going                 Plan - 10/05/19 1521    Clinical Impression Statement 16 minutes late reporting never ending R knee pain. R knee PROM remains well. Weakness noted with LAQ and HS curls with pain with all movements. Attempted to do standing marches in RW but was unable to bear weight in RLE. Gait started with HHA x 2 but was able to move to no UE assist. Very slow gait pattern.    Personal Factors and Comorbidities Comorbidity 3+    Comorbidities DJD right knee, 5 lumbar surgeries, osteoporosis    Examination-Activity Limitations Bathing;Squat;Carry;Stairs;Stand;Toileting;Transfers;Lift;Locomotion Level    Examination-Participation Restrictions Cleaning;Meal Prep;Community Activity;Driving;Laundry;Yard Work    [employer]chant [employer] officer Evolving/Moderate complexity    Rehab Potential Good    PT Frequency 3x / week    PT Duration 8 weeks    PT Treatment/Interventions ADLs/Self Care Home Management;Electrical Stimulation;Moist Heat;Functional mobility training;Stair training;Gait training;Therapeutic activities;Therapeutic  exercise;Balance training;Neuromuscular re-education;Manual techniques;Patient/family education;Cryotherapy;Dry needling;Ultrasound;Vasopneumatic Device    PT Next Visit Plan ROM, gait and function           Patient will benefit from skilled therapeutic intervention in order to improve the following deficits and impairments:  Pain, Increased  muscle spasms, Cardiopulmonary status limiting activity, Decreased activity tolerance, Decreased endurance, Decreased range of motion, Decreased strength, Decreased balance, Difficulty walking, Impaired flexibility, Improper body mechanics, Abnormal gait, Increased edema, Decreased mobility  Visit Diagnosis: Stiffness of right knee, not elsewhere classified  Acute pain of right knee  Difficulty in walking, not elsewhere classified  Localized edema     Problem List Patient Active Problem List   Diagnosis Date Noted  . Status post total right knee replacement 08/13/2019  . Unilateral primary osteoarthritis, right knee 05/24/2019  . Fracture, proximal femur ([place]) 06/12/2018  . Femur fracture, left ([place]) 06/12/2018  . Acute blood loss as cause of postoperative anemia 03/30/2017  . Diabetic polyneuropathy associated with secondary diabetes mellitus ([place]) 03/30/2017  . Asthma 03/30/2017  . Dyslipidemia 03/26/2017  . Lumbar stenosis with neurogenic claudication 03/17/2017  . Spinal stenosis of lumbar region 03/03/2017  . Myoclonic jerking   . Nausea   . Acute encephalopathy 07/15/2016  . Lumbar radiculopathy 06/27/2016  . Hypothyroidism 06/26/2016  . Labile blood glucose   . Surgery, elective   . Post-operative pain   . Sickle cell trait ([place])   . Acute blood loss anemia   . History of back surgery   . AKI (acute kidney injury) ([place])   . Herniated nucleus pulposus, L2-3 06/25/2016  . Bilateral primary osteoarthritis of knee 03/26/2016  . Osteoarthritis, hand 03/26/2016  . Other insomnia 03/26/2016  . Fibromyalgia 09/27/2014  .  Nocturnal leg cramps 09/27/2014  . Body mass index (BMI) of 30.0-30.9 in adult 08/04/2014  . Neuropathy 03/10/2014  . Allergic rhinitis 03/10/2014  . Osteoporosis 03/10/2014  . Spinal stenosis of lumbar region with radiculopathy 02/28/2014  . Type 2 diabetes mellitus with complication, with long-term current use of insulin ([place]) 07/08/2013  . Hypokalemia 11/02/2012  . Essential hypertension, benign 11/02/2012  . Diabetes mellitus with neuropathy ([place]) 10/29/2012  . Hyperlipidemia 10/29/2012  . Spondylolisthesis of lumbar region 03/19/2011  . Lumbar radicular pain 03/19/2011    Scot Jun, PTA 10/05/2019, 3:23 PM  [place], [place], [address] Phone: [phone]   Fax:  [phone]  Name: DEMICA ZOOK MRN: [id number] Date of Birth: 30-Mar-1941

## 2019-10-07 ENCOUNTER — Other Ambulatory Visit: Payer: Self-pay

## 2019-10-07 ENCOUNTER — Ambulatory Visit: Payer: Medicare PPO | Admitting: Physical Therapy

## 2019-10-07 ENCOUNTER — Encounter: Payer: Self-pay | Admitting: Physical Therapy

## 2019-10-07 DIAGNOSIS — M25561 Pain in right knee: Secondary | ICD-10-CM

## 2019-10-07 DIAGNOSIS — R262 Difficulty in walking, not elsewhere classified: Secondary | ICD-10-CM | POA: Diagnosis not present

## 2019-10-07 DIAGNOSIS — M25661 Stiffness of right knee, not elsewhere classified: Secondary | ICD-10-CM | POA: Diagnosis not present

## 2019-10-07 DIAGNOSIS — R600 Localized edema: Secondary | ICD-10-CM | POA: Diagnosis not present

## 2019-10-07 NOTE — Therapy (Signed)
[place], [place], [address] Phone: [phone]   Fax:  [phone]  Physical Therapy Treatment  Patient Details  Name: [name] MRN: [id number] Date of Birth: 05-02-1940 Referring Provider (PT): Ninfa Linden   Encounter Date: 10/07/2019   PT End of Session - 10/07/19 1512    Visit Number 8    Date for PT Re-Evaluation 10/31/19    Authorization Type Medicare    PT Start Time 1440    PT Stop Time 1521    PT Time Calculation (min) 41 min    Activity Tolerance Patient tolerated treatment well    Behavior During Therapy [facility] for tasks assessed/performed           Past Medical History:  Diagnosis Date  . Acute blood loss anemia   . Acute encephalopathy 07/15/2016  . AKI (acute kidney injury) ([place])   . Anemia    has sickle cell trait  . Anxiety   . Arthritis   . Asthma    has used inhaler in past for asthmatic bronchitis, last time- early 2012  . Bilateral primary osteoarthritis of knee 03/26/2016  . Complication of anesthesia    wakes up shaking  . Diabetes mellitus   . Diabetes mellitus with neuropathy ([place]) 10/29/2012  . Diverticulitis   . Dyslipidemia   . Encephalopathy 06/2016   due to medications after surgery  . Fibromyalgia   . GERD (gastroesophageal reflux disease)    occas. use of  Prilosec  . Heart murmur    sees Dr. Montez Morita, last seen- early 2012  . Herniated nucleus pulposus, L2-3 06/25/2016  . History of back surgery   . Hypertension    02/2010- stress test /w PCP  . Hypothyroidism   . Loose bowel movements 12/2016  . Lumbar radiculopathy 06/27/2016  . Lumbar stenosis with neurogenic claudication 03/17/2017  . Memory disorder 02/16/2016  . Myoclonic jerking   . Neuromuscular disorder (HCC)    lumbar radiculopathy, lumbago  . Nocturnal leg cramps 09/27/2014  . Osteoporosis 03/10/2014  . Pneumonia   . Sickle cell trait ([place])   . Sleep apnea    borderline sleep  apnea, states she no longer uses, early 2012- stopped using   . Spinal stenosis of lumbar region with radiculopathy 02/28/2014  . Spondylolisthesis of lumbar region 03/19/2011  . Type II or unspecified type diabetes mellitus without mention of complication, uncontrolled 07/08/2013    Past Surgical History:  Procedure Laterality Date  . ABDOMINAL HYSTERECTOMY    . adb.cyst     ovarian cyst  . BACK SURGERY     2012, 2015 (3 total)  . BACK SURGERY  2018   02/24/2017  . COLONOSCOPY    . EYE SURGERY     macular degeneration treatment - injections  . FEMUR IM NAIL Left 06/13/2018   Procedure: INTRAMEDULLARY (IM) NAIL FEMORAL;  Surgeon: [name], MD;  [facility];  Service: Orthopedics;  Laterality: Left;  . OVARIAN CYST SURGERY    . POSTERIOR LUMBAR FUSION 4 LEVEL N/A 03/17/2017   Procedure: Decompression of Lumbar One-Two with Thoracic Ten to Lumbar Two Fusion;  Surgeon: Kristeen Miss, MD;  Location: [facility];  Service: Neurosurgery;  Laterality: N/A;  Decompression of L1-2 with T10 to L2 Fusion  . TOTAL KNEE ARTHROPLASTY Right 08/13/2019   Procedure: RIGHT TOTAL KNEE ARTHROPLASTY;  Surgeon: [name], MD;  [facility];  Service: Orthopedics;  Laterality: Right;  There were no vitals filed for this visit.   Subjective Assessment - 10/07/19 1444    Subjective "I am better today"    Currently in Pain? Yes    Pain Score 7     Pain Location Knee    Pain Orientation Right                             [facility] Adult PT Treatment/Exercise - 10/07/19 0001      High Level Balance   High Level Balance Activities Side stepping      Knee/Hip Exercises: Aerobic   Recumbent Bike seat 7 x 3 min full revs     Nustep level 4 x 5 minutes      Knee/Hip Exercises: Standing   Other Standing Knee Exercises standing march in RW 2x10       Knee/Hip Exercises: Seated   Long Arc Quad Right;2 sets;10 reps    Long Arc Quad Weight 2 lbs.    Hamstring  Curl Strengthening;10 reps;2 sets;Right    Hamstring Limitations red tband     Sit to Sand 2 sets;without UE support;10 reps   airex on mat table HHA x 2      Vasopneumatic   Number Minutes Vasopneumatic  10 minutes    Vasopnuematic Location  Knee    Vasopneumatic Pressure Low    Vasopneumatic Temperature  37      Manual Therapy   Manual Therapy Passive ROM    Passive ROM R knee flex and ext with some end range hold                    PT Short Term Goals - 09/13/19 1516      PT SHORT TERM GOAL #1   Title independent with initial HEP    Status Achieved             PT Long Term Goals - 09/15/19 1605      PT LONG TERM GOAL #1   Title decrease pain 50%    Status On-going      PT LONG TERM GOAL #2   Title get up from sitting without pain and without hands    Status On-going      PT LONG TERM GOAL #3   Title increase AROM of the right knee to 5-116 degrees flexion    Status On-going                 Plan - 10/07/19 1512    Clinical Impression Statement pt did a lot better today, entering clinic with less pain and being able to do interventions she could not do last treatment session. Tactile cues needed to hold quad contraction at the top of LAQ. Pt tolerated side steps without HHA. Some pain reported at the end range of R knee extensions stretch.    Personal Factors and Comorbidities Comorbidity 3+    Comorbidities DJD right knee, 5 lumbar surgeries, osteoporosis    Examination-Activity Limitations Bathing;Squat;Carry;Stairs;Stand;Toileting;Transfers;Lift;Locomotion Level    Examination-Participation Restrictions Cleaning;Meal Prep;Community Activity;Driving;Laundry;Yard Work    [employer]chant [employer] officer Evolving/Moderate complexity    Rehab Potential Good    PT Frequency 3x / week    PT Duration 8 weeks    PT Next Visit Plan ROM, gait and function           Patient will benefit from skilled therapeutic intervention in order to improve  the following deficits and impairments:  Pain, Increased muscle spasms, Cardiopulmonary status limiting activity, Decreased activity tolerance, Decreased endurance, Decreased range of motion, Decreased strength, Decreased balance, Difficulty walking, Impaired flexibility, Improper body mechanics, Abnormal gait, Increased edema, Decreased mobility  Visit Diagnosis: Localized edema  Difficulty in walking, not elsewhere classified  Acute pain of right knee  Stiffness of right knee, not elsewhere classified     Problem List Patient Active Problem List   Diagnosis Date Noted  . Status post total right knee replacement 08/13/2019  . Unilateral primary osteoarthritis, right knee 05/24/2019  . Fracture, proximal femur ([place]) 06/12/2018  . Femur fracture, left ([place]) 06/12/2018  . Acute blood loss as cause of postoperative anemia 03/30/2017  . Diabetic polyneuropathy associated with secondary diabetes mellitus ([place]) 03/30/2017  . Asthma 03/30/2017  . Dyslipidemia 03/26/2017  . Lumbar stenosis with neurogenic claudication 03/17/2017  . Spinal stenosis of lumbar region 03/03/2017  . Myoclonic jerking   . Nausea   . Acute encephalopathy 07/15/2016  . Lumbar radiculopathy 06/27/2016  . Hypothyroidism 06/26/2016  . Labile blood glucose   . Surgery, elective   . Post-operative pain   . Sickle cell trait ([place])   . Acute blood loss anemia   . History of back surgery   . AKI (acute kidney injury) ([place])   . Herniated nucleus pulposus, L2-3 06/25/2016  . Bilateral primary osteoarthritis of knee 03/26/2016  . Osteoarthritis, hand 03/26/2016  . Other insomnia 03/26/2016  . Fibromyalgia 09/27/2014  . Nocturnal leg cramps 09/27/2014  . Body mass index (BMI) of 30.0-30.9 in adult 08/04/2014  . Neuropathy 03/10/2014  . Allergic rhinitis 03/10/2014  . Osteoporosis 03/10/2014  . Spinal stenosis of lumbar region with radiculopathy 02/28/2014  . Type 2 diabetes mellitus with complication, with  long-term current use of insulin ([place]) 07/08/2013  . Hypokalemia 11/02/2012  . Essential hypertension, benign 11/02/2012  . Diabetes mellitus with neuropathy ([place]) 10/29/2012  . Hyperlipidemia 10/29/2012  . Spondylolisthesis of lumbar region 03/19/2011  . Lumbar radicular pain 03/19/2011    Scot Jun, PTA 10/07/2019, 3:15 PM  [place], [place], [address] Phone: [phone]   Fax:  [phone]  Name: [name] MRN: [id number] Date of Birth: [birth year]

## 2019-10-13 ENCOUNTER — Encounter: Payer: Self-pay | Admitting: Physical Therapy

## 2019-10-13 ENCOUNTER — Ambulatory Visit: Payer: Medicare PPO | Admitting: Physical Therapy

## 2019-10-13 ENCOUNTER — Other Ambulatory Visit: Payer: Self-pay

## 2019-10-13 DIAGNOSIS — R262 Difficulty in walking, not elsewhere classified: Secondary | ICD-10-CM | POA: Diagnosis not present

## 2019-10-13 DIAGNOSIS — R600 Localized edema: Secondary | ICD-10-CM

## 2019-10-13 DIAGNOSIS — M25561 Pain in right knee: Secondary | ICD-10-CM | POA: Diagnosis not present

## 2019-10-13 DIAGNOSIS — M25661 Stiffness of right knee, not elsewhere classified: Secondary | ICD-10-CM | POA: Diagnosis not present

## 2019-10-13 NOTE — Therapy (Signed)
[place], [place], [address] Phone: [phone]   Fax:  [phone]  Physical Therapy Treatment  Patient Details  Name: [name] MRN: [id number] Date of Birth: 1940-08-17 Referring Provider (PT): Ninfa Linden   Encounter Date: 10/13/2019   PT End of Session - 10/13/19 1642    Visit Number 9    Date for PT Re-Evaluation 10/31/19    Authorization Type Medicare    PT Start Time 1602    PT Stop Time 1652    PT Time Calculation (min) 50 min    Activity Tolerance Patient tolerated treatment well    Behavior During Therapy [facility] for tasks assessed/performed           Past Medical History:  Diagnosis Date   Acute blood loss anemia    Acute encephalopathy 07/15/2016   AKI (acute kidney injury) ([place])    Anemia    has sickle cell trait   Anxiety    Arthritis    Asthma    has used inhaler in past for asthmatic bronchitis, last time- early 2012   Bilateral primary osteoarthritis of knee 09/21/3760   Complication of anesthesia    wakes up shaking   Diabetes mellitus    Diabetes mellitus with neuropathy ([place]) 10/29/2012   Diverticulitis    Dyslipidemia    Encephalopathy 06/2016   due to medications after surgery   Fibromyalgia    GERD (gastroesophageal reflux disease)    occas. use of  Prilosec   Heart murmur    sees Dr. Montez Morita, last seen- early 2012   Herniated nucleus pulposus, L2-3 06/25/2016   History of back surgery    Hypertension    02/2010- stress test /w PCP   Hypothyroidism    Loose bowel movements 12/2016   Lumbar radiculopathy 06/27/2016   Lumbar stenosis with neurogenic claudication 03/17/2017   Memory disorder 02/16/2016   Myoclonic jerking    Neuromuscular disorder (HCC)    lumbar radiculopathy, lumbago   Nocturnal leg cramps 09/27/2014   Osteoporosis 03/10/2014   Pneumonia    Sickle cell trait ([employer])    Sleep apnea    borderline sleep  apnea, states she no longer uses, early 2012- stopped using    Spinal stenosis of lumbar region with radiculopathy 02/28/2014   Spondylolisthesis of lumbar region 03/19/2011   Type II or unspecified type diabetes mellitus without mention of complication, uncontrolled 07/08/2013    Past Surgical History:  Procedure Laterality Date   ABDOMINAL HYSTERECTOMY     adb.cyst     ovarian cyst   BACK SURGERY     2012, 2015 (3 total)   BACK SURGERY  2018   02/24/2017   COLONOSCOPY     EYE SURGERY     macular degeneration treatment - injections   FEMUR IM NAIL Left 06/13/2018   Procedure: INTRAMEDULLARY (IM) NAIL FEMORAL;  Surgeon: [name], MD;  Location: [facility];  Service: Orthopedics;  Laterality: Left;   OVARIAN CYST SURGERY     POSTERIOR LUMBAR FUSION 4 LEVEL N/A 03/17/2017   Procedure: Decompression of Lumbar One-Two with Thoracic Ten to Lumbar Two Fusion;  Surgeon: Kristeen Miss, MD;  Location: [place];  Service: Neurosurgery;  Laterality: N/A;  Decompression of L1-2 with T10 to L2 Fusion   TOTAL KNEE ARTHROPLASTY Right 08/13/2019   Procedure: RIGHT TOTAL KNEE ARTHROPLASTY;  Surgeon: [name], MD;  Location: [facility];  Service: Orthopedics;  Laterality: Right;  There were no vitals filed for this visit.   Subjective Assessment - 10/13/19 1608    Subjective "It hurt"    Currently in Pain? Yes    Pain Score 7     Pain Location Knee                             [facility] Adult PT Treatment/Exercise - 10/13/19 0001      Knee/Hip Exercises: Aerobic   Recumbent Bike seat 7 x 3 min full revs     Nustep level 4 x 5 minutes      Knee/Hip Exercises: Standing   Forward Step Up Right;2 sets;5 reps;Step Height: 4";Hand Hold: 2      Knee/Hip Exercises: Seated   Long Arc Quad Right;2 sets;10 reps    Long Arc Quad Weight 3 lbs.    Hamstring Curl Strengthening;2 sets;Right;15 reps   x2   Hamstring Limitations red tband     Sit to Sand 10  reps;2 sets;with UE support   2 airex on mat table      Vasopneumatic   Number Minutes Vasopneumatic  10 minutes    Vasopnuematic Location  Knee    Vasopneumatic Pressure Low    Vasopneumatic Temperature  37                    PT Short Term Goals - 09/13/19 1516      PT SHORT TERM GOAL #1   Title independent with initial HEP    Status Achieved             PT Long Term Goals - 09/15/19 1605      PT LONG TERM GOAL #1   Title decrease pain 50%    Status On-going      PT LONG TERM GOAL #2   Title get up from sitting without pain and without hands    Status On-going      PT LONG TERM GOAL #3   Title increase AROM of the right knee to 5-116 degrees flexion    Status On-going                 Plan - 10/13/19 1642    Clinical Impression Statement Pt unwilling to try machine level strengthening interventions. She reports that she is not ready for that. She ambulated in clinic with [place] and forward flexed trunk. Decrease RLE TKE with gait. She report [place] with all exercises. When attempting to try some R knee Ext ROM pt said stop cause it hurt.    Personal Factors and Comorbidities Comorbidity 3+    Comorbidities DJD right knee, 5 lumbar surgeries, osteoporosis    Examination-Activity Limitations Bathing;Squat;Carry;Stairs;Stand;Toileting;Transfers;Lift;Locomotion Level    Examination-Participation Restrictions Cleaning;Meal Prep;Community Activity;Driving;Laundry;Yard Work    [employer] Evolving/Moderate complexity    Rehab Potential Good    PT Frequency 3x / week    PT Duration 8 weeks    PT Treatment/Interventions ADLs/Self Care Home Management;Electrical Stimulation;Moist Heat;Functional mobility training;Stair training;Gait training;Therapeutic activities;Therapeutic exercise;Balance training;Neuromuscular re-education;Manual techniques;Patient/family education;Cryotherapy;Dry needling;Ultrasound;Vasopneumatic Device    PT Next Visit  Plan ROM, gait and function           Patient will benefit from skilled therapeutic intervention in order to improve the following deficits and impairments:  Pain, Increased muscle spasms, Cardiopulmonary status limiting activity, Decreased activity tolerance, Decreased endurance, Decreased range of motion, Decreased strength, Decreased balance, Difficulty walking, Impaired flexibility, Improper body mechanics, Abnormal gait,  Increased edema, Decreased mobility  Visit Diagnosis: Difficulty in walking, not elsewhere classified  Acute pain of right knee  Localized edema  Stiffness of right knee, not elsewhere classified     Problem List Patient Active Problem List   Diagnosis Date Noted   Status post total right knee replacement 08/13/2019   Unilateral primary osteoarthritis, right knee 05/24/2019   Fracture, proximal femur ([place]) 06/12/2018   Femur fracture, left ([place]) 06/12/2018   Acute blood loss as cause of postoperative anemia 03/30/2017   Diabetic polyneuropathy associated with secondary diabetes mellitus ([place]) 03/30/2017   Asthma 03/30/2017   Dyslipidemia 03/26/2017   Lumbar stenosis with neurogenic claudication 03/17/2017   Spinal stenosis of lumbar region 03/03/2017   Myoclonic jerking    Nausea    Acute encephalopathy 07/15/2016   Lumbar radiculopathy 06/27/2016   Hypothyroidism 06/26/2016   Labile blood glucose    Surgery, elective    Post-operative pain    Sickle cell trait (HCC)    Acute blood loss anemia    History of back surgery    AKI (acute kidney injury) ([place])    Herniated nucleus pulposus, L2-3 06/25/2016   Bilateral primary osteoarthritis of knee 03/26/2016   Osteoarthritis, hand 03/26/2016   Other insomnia 03/26/2016   Fibromyalgia 09/27/2014   Nocturnal leg cramps 09/27/2014   Body mass index (BMI) of 30.0-30.9 in adult 08/04/2014   Neuropathy 03/10/2014   Allergic rhinitis 03/10/2014   Osteoporosis 03/10/2014     Spinal stenosis of lumbar region with radiculopathy 02/28/2014   Type 2 diabetes mellitus with complication, with long-term current use of insulin ([place]) 07/08/2013   Hypokalemia 11/02/2012   Essential hypertension, benign 11/02/2012   Diabetes mellitus with neuropathy ([place]) 10/29/2012   Hyperlipidemia 10/29/2012   Spondylolisthesis of lumbar region 03/19/2011   Lumbar radicular pain 03/19/2011    Scot Jun, PTA 10/13/2019, 4:46 PM  [place], [place], [address] Phone: [phone]   Fax:  [phone]  Name: ANAIA FRITH MRN: [id number] Date of Birth: Jun 27, 1940

## 2019-10-21 ENCOUNTER — Other Ambulatory Visit: Payer: Self-pay

## 2019-10-21 ENCOUNTER — Ambulatory Visit (INDEPENDENT_AMBULATORY_CARE_PROVIDER_SITE_OTHER): Payer: Medicare PPO

## 2019-10-21 ENCOUNTER — Ambulatory Visit (INDEPENDENT_AMBULATORY_CARE_PROVIDER_SITE_OTHER): Payer: Medicare PPO | Admitting: Orthopaedic Surgery

## 2019-10-21 ENCOUNTER — Encounter: Payer: Self-pay | Admitting: Orthopaedic Surgery

## 2019-10-21 DIAGNOSIS — Z96651 Presence of right artificial knee joint: Secondary | ICD-10-CM | POA: Diagnosis not present

## 2019-10-21 NOTE — Progress Notes (Signed)
The patient is very well-known to me.  She is now about 9 weeks out from a total knee arthroplasty.  She had been doing well postoperatively until recent.  She says the knee is not feeling right to her and she does not like the alignment.  On examination certainly she is in more of a valgus alignment than what she was right after surgery.  The knee is painful to put her through motion but it is showing that she does have flexion extension.  There is normal postoperative swelling.  I am concerned about the valgus malalignment though.  2 views of the right total knee standing compared to supine films a day of surgery show that certainly she is now in some valgus malalignment which can be indicative of loosening of the tibial tray.  This is a press-fit system.  She did have good bone quality at the time of surgery and I see no sclerotic or lucent lines around the tibial component but this is certainly worrisome.  I talked her about the possibility of needing a revision of the tibial component.  She is wary of having any other surgery right now.  I am to place her in a hinged knee brace and have her stop therapy for now.  I would like to see her back in 4 weeks from now for repeat exam and a repeat 2 views of the right knee standing.

## 2019-10-26 ENCOUNTER — Ambulatory Visit: Payer: Medicare PPO | Admitting: Physical Therapy

## 2019-10-28 ENCOUNTER — Encounter: Payer: Medicare PPO | Admitting: Physical Therapy

## 2019-11-02 ENCOUNTER — Encounter: Payer: Medicare PPO | Admitting: Physical Therapy

## 2019-11-04 ENCOUNTER — Encounter: Payer: Medicare PPO | Admitting: Physical Therapy

## 2019-11-12 ENCOUNTER — Telehealth: Payer: Self-pay | Admitting: *Deleted

## 2019-11-12 NOTE — Telephone Encounter (Signed)
Attempted 90 day Ortho bundle call. Left VM on cell number requesting call back.

## 2019-11-17 ENCOUNTER — Telehealth: Payer: Self-pay | Admitting: *Deleted

## 2019-11-17 NOTE — Telephone Encounter (Signed)
Attempted 90 day call to patient. No answer and left another VM. Will see if RNCM can meet with patient during her next office visit next week.

## 2019-11-22 ENCOUNTER — Ambulatory Visit (INDEPENDENT_AMBULATORY_CARE_PROVIDER_SITE_OTHER): Payer: Medicare PPO | Admitting: Orthopaedic Surgery

## 2019-11-22 ENCOUNTER — Other Ambulatory Visit: Payer: Self-pay

## 2019-11-22 ENCOUNTER — Encounter: Payer: Self-pay | Admitting: Orthopaedic Surgery

## 2019-11-22 ENCOUNTER — Ambulatory Visit (INDEPENDENT_AMBULATORY_CARE_PROVIDER_SITE_OTHER): Payer: Medicare PPO

## 2019-11-22 VITALS — Ht 65.0 in | Wt 176.0 lb

## 2019-11-22 DIAGNOSIS — Z96651 Presence of right artificial knee joint: Secondary | ICD-10-CM

## 2019-11-22 NOTE — Progress Notes (Signed)
The patient is now just over 3 months out from a right total knee arthroplasty.  She is 79 years old.  We used press-fit implants because we felt that she had good quality of her bone.  The postoperative x-rays look good.  A month ago she had done something with her knee either through therapy and developed some valgus malalignment of the knee.  I placed her in a hinged knee brace.  She is not interested in any surgical intervention.  She is ambulate with a walker.  She feels like the hinged knee brace is helping and she is not hurting at all.  She only takes some ibuprofen occasion during the day and some hydrocodone as needed at night.  On examination of her right knee today there is no effusion with excellent range of motion but there is valgus malalignment of the knee that is correctable.  2 views of the right knee standing does show some valgus positioning of the knee that is slowly worsened.  There is no sclerotic lines around the tibial implant.  It certainly change when I compare this with her postoperative films and I showed this to her in detail.  I am concerned that she would benefit from a revision of the tibial component.  However she would like [place] to just watch this since she is doing well and is wary of surgery given her sickle cell trait and I agree with this as well.  Also have her wear her hinged knee brace and I like to see her back in 3 months with a repeat AP and lateral of her right knee.  All question concerns were answered and addressed.

## 2019-11-23 ENCOUNTER — Telehealth: Payer: Self-pay | Admitting: Orthopaedic Surgery

## 2019-11-23 NOTE — Telephone Encounter (Signed)
Patient called asked can she go back to doing (PT)? Patient said [facility] does PT where she is at [facility]. The number to contact patient is [phone]

## 2019-11-23 NOTE — Telephone Encounter (Signed)
I am fine with her having PT set up again there on her right knee.

## 2019-11-24 ENCOUNTER — Other Ambulatory Visit: Payer: Self-pay

## 2019-11-24 ENCOUNTER — Telehealth: Payer: Self-pay

## 2019-11-24 DIAGNOSIS — Z96651 Presence of right artificial knee joint: Secondary | ICD-10-CM

## 2019-11-24 NOTE — Telephone Encounter (Signed)
[facility] called. States was this referral to PT suppose to go to them. Coverage is "[place]" Needs clarification or update the referral.   [facility] outpatient rehab  [place] [phone]

## 2019-11-24 NOTE — Telephone Encounter (Signed)
ordered

## 2019-11-24 NOTE — Telephone Encounter (Signed)
[facility] called. States was this referral to PT suppose to go to them. Coverage is "[place]" Needs clarification or update the referral.   [facility] outpatient rehab  [place] [phone]

## 2019-11-25 NOTE — Telephone Encounter (Signed)
LMOM stating I was just trying to get her in with the same PT she had seen before, but if this is where her insurance coverage is, that is fine too

## 2019-12-03 ENCOUNTER — Encounter: Payer: Self-pay | Admitting: Orthopaedic Surgery

## 2019-12-09 ENCOUNTER — Telehealth: Payer: Self-pay

## 2019-12-09 NOTE — Telephone Encounter (Signed)
States she just moved to a facility and asking if she can do PT there, I told her that was totally fine but she's says they will fax/call us for signatures from [facility]

## 2019-12-09 NOTE — Telephone Encounter (Signed)
Patient called in saying she has been trying  contacting through my chart.

## 2019-12-20 DIAGNOSIS — M6281 Muscle weakness (generalized): Secondary | ICD-10-CM | POA: Diagnosis not present

## 2019-12-21 DIAGNOSIS — M6281 Muscle weakness (generalized): Secondary | ICD-10-CM | POA: Diagnosis not present

## 2019-12-21 DIAGNOSIS — R279 Unspecified lack of coordination: Secondary | ICD-10-CM | POA: Diagnosis not present

## 2019-12-21 DIAGNOSIS — R262 Difficulty in walking, not elsewhere classified: Secondary | ICD-10-CM | POA: Diagnosis not present

## 2019-12-24 DIAGNOSIS — R262 Difficulty in walking, not elsewhere classified: Secondary | ICD-10-CM | POA: Diagnosis not present

## 2019-12-24 DIAGNOSIS — M6281 Muscle weakness (generalized): Secondary | ICD-10-CM | POA: Diagnosis not present

## 2019-12-24 DIAGNOSIS — R279 Unspecified lack of coordination: Secondary | ICD-10-CM | POA: Diagnosis not present

## 2019-12-28 ENCOUNTER — Other Ambulatory Visit (INDEPENDENT_AMBULATORY_CARE_PROVIDER_SITE_OTHER): Payer: Medicare HMO

## 2019-12-28 ENCOUNTER — Other Ambulatory Visit: Payer: Self-pay

## 2019-12-28 DIAGNOSIS — E1165 Type 2 diabetes mellitus with hyperglycemia: Secondary | ICD-10-CM

## 2019-12-28 DIAGNOSIS — Z794 Long term (current) use of insulin: Secondary | ICD-10-CM

## 2019-12-28 DIAGNOSIS — E782 Mixed hyperlipidemia: Secondary | ICD-10-CM | POA: Diagnosis not present

## 2019-12-28 DIAGNOSIS — E063 Autoimmune thyroiditis: Secondary | ICD-10-CM

## 2019-12-28 LAB — COMPREHENSIVE METABOLIC PANEL
ALT: 8 U/L (ref 0–35)
AST: 13 U/L (ref 0–37)
Albumin: 4.2 g/dL (ref 3.5–5.2)
Alkaline Phosphatase: 71 U/L (ref 39–117)
BUN: 17 mg/dL (ref 6–23)
CO2: 29 mEq/L (ref 19–32)
Calcium: 9.4 mg/dL (ref 8.4–10.5)
Chloride: 103 mEq/L (ref 96–112)
Creatinine, Ser: 1.02 mg/dL (ref 0.40–1.20)
GFR: 63.09 mL/min (ref >60.00)
Glucose, Bld: 96 mg/dL (ref 70–99)
Potassium: 3.5 mEq/L (ref 3.5–5.1)
Sodium: 141 mEq/L (ref 135–145)
Total Bilirubin: 0.6 mg/dL (ref 0.2–1.2)
Total Protein: 6.7 g/dL (ref 6.0–8.3)

## 2019-12-28 LAB — LIPID PANEL
Cholesterol: 226 mg/dL — ABNORMAL HIGH (ref 0–200)
HDL: 55.7 mg/dL (ref >39.00)
LDL Cholesterol: 153 mg/dL — ABNORMAL HIGH (ref 0–99)
NonHDL: 170.08
Total CHOL/HDL Ratio: 4
Triglycerides: 84 mg/dL (ref 0.0–149.0)
VLDL: 16.8 mg/dL (ref 0.0–40.0)

## 2019-12-28 LAB — HEMOGLOBIN A1C: Hgb A1c MFr Bld: 6.6 % — ABNORMAL HIGH (ref 4.6–6.5)

## 2019-12-28 LAB — TSH: TSH: 4.76 u[IU]/mL — ABNORMAL HIGH (ref 0.35–4.50)

## 2019-12-29 ENCOUNTER — Telehealth: Payer: Self-pay

## 2019-12-29 DIAGNOSIS — M6281 Muscle weakness (generalized): Secondary | ICD-10-CM | POA: Diagnosis not present

## 2019-12-29 MED ORDER — LEVOTHYROXINE SODIUM 50 MCG PO TABS: ORAL_TABLET | ORAL | 1 refills | Status: DC

## 2019-12-31 DIAGNOSIS — M6281 Muscle weakness (generalized): Secondary | ICD-10-CM | POA: Diagnosis not present

## 2019-12-31 DIAGNOSIS — R262 Difficulty in walking, not elsewhere classified: Secondary | ICD-10-CM | POA: Diagnosis not present

## 2019-12-31 DIAGNOSIS — R279 Unspecified lack of coordination: Secondary | ICD-10-CM | POA: Diagnosis not present

## 2020-01-03 ENCOUNTER — Other Ambulatory Visit: Payer: Self-pay

## 2020-01-03 MED ORDER — LEVOTHYROXINE SODIUM 50 MCG PO TABS: ORAL_TABLET | ORAL | 1 refills | Status: DC

## 2020-01-03 NOTE — Telephone Encounter (Signed)
[facility] called stating the patient asked for the Levothyroxine to go through there instead of [facility].  [place], [place] Phone:  [phone]  Fax:  [phone]

## 2020-01-03 NOTE — Telephone Encounter (Signed)
Changed Rx to [facility] has been resolved

## 2020-01-04 ENCOUNTER — Ambulatory Visit: Payer: Medicare HMO

## 2020-01-04 DIAGNOSIS — M6281 Muscle weakness (generalized): Secondary | ICD-10-CM | POA: Diagnosis not present

## 2020-01-04 DIAGNOSIS — M85851 Other specified disorders of bone density and structure, right thigh: Secondary | ICD-10-CM | POA: Diagnosis not present

## 2020-01-05 DIAGNOSIS — M6281 Muscle weakness (generalized): Secondary | ICD-10-CM | POA: Diagnosis not present

## 2020-01-05 DIAGNOSIS — R262 Difficulty in walking, not elsewhere classified: Secondary | ICD-10-CM | POA: Diagnosis not present

## 2020-01-05 DIAGNOSIS — R279 Unspecified lack of coordination: Secondary | ICD-10-CM | POA: Diagnosis not present

## 2020-01-07 DIAGNOSIS — R262 Difficulty in walking, not elsewhere classified: Secondary | ICD-10-CM | POA: Diagnosis not present

## 2020-01-07 DIAGNOSIS — M6281 Muscle weakness (generalized): Secondary | ICD-10-CM | POA: Diagnosis not present

## 2020-01-07 DIAGNOSIS — R279 Unspecified lack of coordination: Secondary | ICD-10-CM | POA: Diagnosis not present

## 2020-01-10 DIAGNOSIS — R262 Difficulty in walking, not elsewhere classified: Secondary | ICD-10-CM | POA: Diagnosis not present

## 2020-01-10 DIAGNOSIS — M6281 Muscle weakness (generalized): Secondary | ICD-10-CM | POA: Diagnosis not present

## 2020-01-10 DIAGNOSIS — R279 Unspecified lack of coordination: Secondary | ICD-10-CM | POA: Diagnosis not present

## 2020-01-11 ENCOUNTER — Other Ambulatory Visit: Payer: Self-pay

## 2020-01-11 ENCOUNTER — Ambulatory Visit: Payer: Medicare HMO

## 2020-01-11 ENCOUNTER — Ambulatory Visit: Payer: Medicare HMO | Admitting: Endocrinology

## 2020-01-11 VITALS — BP 122/72 | HR 81 | Temp 98.0°F | Ht 65.0 in | Wt 169.0 lb

## 2020-01-11 DIAGNOSIS — E876 Hypokalemia: Secondary | ICD-10-CM

## 2020-01-11 DIAGNOSIS — M6281 Muscle weakness (generalized): Secondary | ICD-10-CM | POA: Diagnosis not present

## 2020-01-11 DIAGNOSIS — Z794 Long term (current) use of insulin: Secondary | ICD-10-CM | POA: Diagnosis not present

## 2020-01-11 DIAGNOSIS — E1165 Type 2 diabetes mellitus with hyperglycemia: Secondary | ICD-10-CM

## 2020-01-11 DIAGNOSIS — E063 Autoimmune thyroiditis: Secondary | ICD-10-CM | POA: Diagnosis not present

## 2020-01-11 DIAGNOSIS — E782 Mixed hyperlipidemia: Secondary | ICD-10-CM

## 2020-01-11 MED ORDER — REPATHA SURECLICK 140 MG/ML ~~LOC~~ SOAJ: SUBCUTANEOUS | 1 refills | Status: DC

## 2020-01-11 NOTE — Progress Notes (Signed)
Patient ID: [name], female   DOB: [birth year], 79 y.o.   MRN: [id number]      Reason for Appointment: Endocrinology follow-up  History of Present Illness    PROBLEM 1: Type 2 diabetes mellitus, date of diagnosis: 1998.   Prior history: She had previously been treated with Byetta, Glumetza, Amaryl, Victoza and  Onglyza However because of inadequate control and intolerance to drugs she was finally given pre-meal insulin along with Amaryl, Glumetza eventually stopped because of side effects and also Lantus added  Recent history:  The insulin regimen is: Novolog 6 at breakfast and 10 lunch, 8-9 acs Tresiba 20 units daily  Oral agents: Farxiga 5 mg daily  Her A1c is 6.6, has been mostly between 5.9--7.4  Current blood sugar patterns and problems with management:  She has not checked her blood sugars consistently especially in the last couple of weeks  Also blood sugars have been variable  Although fasting blood sugars are relatively higher at home she had a blood sugar below 100 in the lab fasting  She is now at a senior living facility and she thinks her evening meal is lighter but she has not reduced her evening dose of NovoLog consistently  Only once has had a low normal sugar of 77 at bedtime and no overnight hypoglycemia also  She is starting to be more active with some walking and exercise bicycle with recovery from her knee surgery  Has been continuing on Farxiga without change in renal function  She does not always eat breakfast but will take her NovoLog when she does  [place] can be taken either in the morning or at lunchtime  Also consistent with lunchtime dose; however not clear what her readings are after lunch  She says she has a dessert at lunch usually  Side effects from diabetes medications: Victoza caused nausea.  Metformin  Monitors blood glucose: 1-2 times a day, readings as below  Glucometer: One Touch ultra 2   PRE-MEAL Fasting  Lunch Dinner  overnight Overall  Glucose range:  129-196  115   85-182  77-234  Mean/median:  151    135  144   POST-MEAL PC Breakfast PC Lunch PC Dinner  Glucose range:    234  Mean/median:       Previous readings: FASTING range 103-124 Afternoon 123, evening 201   Meals: 2-3 meals per day at 10 am. 2 pm and 6-7 pm but inconsistent schedule.  Breakfast may be [place] sausage and egg with toast, occasionally oatmeal or cereal   Physical activity: exercise: Minimal, previously doing water aerobics 3/7 days a week  Certified Diabetes Educator visit: Most recent: 7/13.  Dietician visit: Most recent: 3/13.   Wt Readings from Last 3 Encounters:  01/11/20 169 lb (76.7 kg)  11/22/19 176 lb (79.8 kg)  09/16/19 176 lb 6.4 oz (80 kg)   Complications: Neuropathy  LABS:  Lab Results  Component Value Date   HGBA1C 6.6 (H) 12/28/2019   HGBA1C 5.9 09/14/2019   HGBA1C 6.5 (H) 08/10/2019   Lab Results  Component Value Date   MICROALBUR 4.8 (H) 02/09/2019   LDLCALC 153 (H) 12/28/2019   CREATININE 1.02 12/28/2019   Lab Results  Component Value Date   FRUCTOSAMINE 283 06/09/2018   FRUCTOSAMINE 296 (H) 05/16/2017   FRUCTOSAMINE 322 (H) 10/10/2016    Multiple other issues are addressed in review of systems  No visits with results within 1 Week(s) from this visit.  Latest known visit  with results is:  Lab on 12/28/2019  Component Date Value Ref Range Status  . TSH 12/28/2019 4.76* 0.35 - 4.50 uIU/mL Final  . Cholesterol 12/28/2019 226* 0 - 200 mg/dL Final   ATP III Classification       Desirable:  < 200 mg/dL               Borderline High:  200 - 239 mg/dL          High:  > = 240 mg/dL  . Triglycerides 12/28/2019 84.0  0 - 149 mg/dL Final   Normal:  <150 mg/dLBorderline High:  150 - 199 mg/dL  . HDL 12/28/2019 55.70  >39.00 mg/dL Final  . VLDL 12/28/2019 16.8  0.0 - 40.0 mg/dL Final  . LDL Cholesterol 12/28/2019 153* 0 - 99 mg/dL Final  . Total CHOL/HDL Ratio 12/28/2019 4    Final                  Men          Women1/2 Average Risk     3.4          3.3Average Risk          5.0          4.42X Average Risk          9.6          7.13X Average Risk          15.0          11.0                      . NonHDL 12/28/2019 170.08   Final   NOTE:  Non-HDL goal should be 30 mg/dL higher than patient's LDL goal (i.e. LDL goal of < 70 mg/dL, would have non-HDL goal of < 100 mg/dL)  . Sodium 12/28/2019 141  135 - 145 mEq/L Final  . Potassium 12/28/2019 3.5  3.5 - 5.1 mEq/L Final  . Chloride 12/28/2019 103  96 - 112 mEq/L Final  . CO2 12/28/2019 29  19 - 32 mEq/L Final  . Glucose, Bld 12/28/2019 96  70 - 99 mg/dL Final  . BUN 12/28/2019 17  6 - 23 mg/dL Final  . Creatinine, Ser 12/28/2019 1.02  0.40 - 1.20 mg/dL Final  . Total Bilirubin 12/28/2019 0.6  0.2 - 1.2 mg/dL Final  . Alkaline Phosphatase 12/28/2019 71  39 - 117 U/L Final  . AST 12/28/2019 13  0 - 37 U/L Final  . ALT 12/28/2019 8  0 - 35 U/L Final  . Total Protein 12/28/2019 6.7  6.0 - 8.3 g/dL Final  . Albumin 12/28/2019 4.2  3.5 - 5.2 g/dL Final  . GFR 12/28/2019 63.09  >60.00 mL/min Final  . Calcium 12/28/2019 9.4  8.4 - 10.5 mg/dL Final  . Hgb A1c MFr Bld 12/28/2019 6.6* 4.6 - 6.5 % Final   Glycemic Control Guidelines for People with Diabetes:Non Diabetic:  <6%Goal of Therapy: <7%Additional Action Suggested:  >8%       Allergies as of 01/11/2020      Reactions   Cymbalta [duloxetine Hcl] Diarrhea, Other (See Comments)   Dizziness, headache, irritability   Baclofen    jerks    Betadine [povidone Iodine] Swelling, Other (See Comments)   SWELLING REACTION DESCRIPTION/SEVERITY UNSPECIFIED  Reaction to betadine eye drops   Percocet [oxycodone-acetaminophen]    Confusion   Adhesive [tape] Rash   Lipitor [atorvastatin] Rash  Medication List       Accurate as of January 11, 2020 11:21 AM. If you have any questions, ask your nurse or doctor.        Accu-Chek Aviva Plus w/Device Kit Use Accu Chek  Aviva as instructed to check blood sugar 4 times daily.   acetaminophen 500 MG tablet Commonly known as: TYLENOL Take 1,000 mg by mouth every 8 (eight) hours as needed for mild pain or moderate pain.   aspirin 81 MG chewable tablet Chew 1 tablet (81 mg total) by mouth 2 (two) times daily.   BD Pen Needle Nano U/F 32G X 4 MM Misc Generic drug: Insulin Pen Needle Use to inject insulin 4 times daily.   Bystolic 5 MG tablet Generic drug: nebivolol Take 5 mg by mouth daily.   cetirizine 10 MG tablet Commonly known as: ZYRTEC Take 1 tablet (10 mg total) by mouth daily as needed for allergies.   Farxiga 5 MG Tabs tablet Generic drug: dapagliflozin propanediol Take 1 tablet by mouth once daily. DX:E11.65 What changed:   how much to take  how to take this  when to take this   fluticasone 50 MCG/ACT nasal spray Commonly known as: FLONASE Place 1 spray into both nostrils daily as needed for allergies. What changed:   how much to take  when to take this   gabapentin 600 MG tablet Commonly known as: NEURONTIN TAKE 1 TABLET(600 MG) BY MOUTH THREE TIMES DAILY What changed: See the new instructions.   glucose blood test strip Use Accu Chek Aviva test strips as instructed to check blood sugar 4 times daily.   HYDROcodone-acetaminophen 5-325 MG tablet Commonly known as: Norco Take 1-2 tablets by mouth every 6 (six) hours as needed for moderate pain.   hydrOXYzine 25 MG tablet Commonly known as: ATARAX/VISTARIL Take 1 tablet (25 mg total) by mouth every 6 (six) hours as needed for anxiety, nausea or vomiting.   ibuprofen 800 MG tablet Commonly known as: ADVIL Take 1 tablet (800 mg total) by mouth every 8 (eight) hours as needed. What changed: reasons to take this   irbesartan 300 MG tablet Commonly known as: AVAPRO Take 300 mg by mouth daily.   ketoconazole 2 % cream Commonly known as: NIZORAL Apply 1 application topically See admin instructions. For 14 days for  irritation when needed   levothyroxine 50 MCG tablet Commonly known as: SYNTHROID TAKE 1 TABLET(50 MCG) BY MOUTH DAILY   MAGNESIUM PO Take 250 mg by mouth 2 (two) times daily.   meloxicam 15 MG tablet Commonly known as: MOBIC Take 15 mg by mouth daily.   methocarbamol 500 MG tablet Commonly known as: ROBAXIN Take 1 tablet (500 mg total) by mouth every 6 (six) hours as needed for muscle spasms.   NovoLOG FlexPen 100 UNIT/ML FlexPen Generic drug: insulin aspart Inject 6 units under the skin before breakfast, 9 units at lunch, and 8-10 units at dinner. 3-4 PRN for large carbs or ice cream. DX:E11.65   OCUVITE ADULT FORMULA PO Take 1 tablet by mouth daily.   multivitamin with minerals tablet Take 1 tablet by mouth daily.   ondansetron 4 MG disintegrating tablet Commonly known as: Zofran ODT Take 1 tablet (4 mg total) by mouth every 8 (eight) hours as needed for nausea or vomiting.   potassium chloride 10 MEQ tablet Commonly known as: KLOR-CON Take 10 mEq by mouth daily as needed (low potassium).   Repatha SureClick 320 MG/ML Soaj Generic drug: Evolocumab Inject contents of  pen in stomach area every 2 weeks   Symbicort 80-4.5 MCG/ACT inhaler Generic drug: budesonide-formoterol Inhale 2 puffs into the lungs 2 (two) times daily as needed (shortness of breath).   Tyler Aas FlexTouch 100 UNIT/ML FlexTouch Pen Generic drug: insulin degludec INJECT 22 UNITS UNDER THE SKIN EVERY DAY   zinc gluconate 50 MG tablet Take 50 mg by mouth daily.       Allergies:  Allergies  Allergen Reactions  . Cymbalta [Duloxetine Hcl] Diarrhea and Other (See Comments)    Dizziness, headache, irritability  . Baclofen     jerks   . Betadine [Povidone Iodine] Swelling and Other (See Comments)    SWELLING REACTION DESCRIPTION/SEVERITY UNSPECIFIED  Reaction to betadine eye drops  . Percocet [Oxycodone-Acetaminophen]     Confusion   . Adhesive [Tape] Rash  . Lipitor [Atorvastatin] Rash     Past Medical History:  Diagnosis Date  . Acute blood loss anemia   . Acute encephalopathy 07/15/2016  . AKI (acute kidney injury) ([place])   . Anemia    has sickle cell trait  . Anxiety   . Arthritis   . Asthma    has used inhaler in past for asthmatic bronchitis, last time- early 2012  . Bilateral primary osteoarthritis of knee 03/26/2016  . Complication of anesthesia    wakes up shaking  . Diabetes mellitus   . Diabetes mellitus with neuropathy ([place]) 10/29/2012  . Diverticulitis   . Dyslipidemia   . Encephalopathy 06/2016   due to medications after surgery  . Fibromyalgia   . GERD (gastroesophageal reflux disease)    occas. use of  Prilosec  . Heart murmur    sees Dr. Montez Morita, last seen- early 2012  . Herniated nucleus pulposus, L2-3 06/25/2016  . History of back surgery   . Hypertension    02/2010- stress test /w PCP  . Hypothyroidism   . Loose bowel movements 12/2016  . Lumbar radiculopathy 06/27/2016  . Lumbar stenosis with neurogenic claudication 03/17/2017  . Memory disorder 02/16/2016  . Myoclonic jerking   . Neuromuscular disorder ([facility])    lumbar radiculopathy, lumbago  . Nocturnal leg cramps 09/27/2014  . Osteoporosis 03/10/2014  . Pneumonia   . Sickle cell trait ([place])   . Sleep apnea    borderline sleep apnea, states she no longer uses, early 2012- stopped using   . Spinal stenosis of lumbar region with radiculopathy 02/28/2014  . Spondylolisthesis of lumbar region 03/19/2011  . Type II or unspecified type diabetes mellitus without mention of complication, uncontrolled 07/08/2013    Past Surgical History:  Procedure Laterality Date  . ABDOMINAL HYSTERECTOMY    . adb.cyst     ovarian cyst  . BACK SURGERY     2012, 2015 (3 total)  . BACK SURGERY  2018   02/24/2017  . COLONOSCOPY    . EYE SURGERY     macular degeneration treatment - injections  . FEMUR IM NAIL Left 06/13/2018   Procedure: INTRAMEDULLARY (IM) NAIL FEMORAL;  Surgeon: [name],  MD;  Location: [facility];  Service: Orthopedics;  Laterality: Left;  . OVARIAN CYST SURGERY    . POSTERIOR LUMBAR FUSION 4 LEVEL N/A 03/17/2017   Procedure: Decompression of Lumbar One-Two with Thoracic Ten to Lumbar Two Fusion;  Surgeon: Kristeen Miss, MD;  Location: [facility];  Service: Neurosurgery;  Laterality: N/A;  Decompression of L1-2 with T10 to L2 Fusion  . TOTAL KNEE ARTHROPLASTY Right 08/13/2019   Procedure: RIGHT TOTAL KNEE ARTHROPLASTY;  Surgeon:  [name], MD;  Location: [facility];  Service: Orthopedics;  Laterality: Right;    Family History  Problem Relation Age of Onset  . Ovarian cancer Mother   . Cancer - Prostate Father   . Breast cancer Paternal Aunt   . Multiple myeloma Paternal Aunt   . Anesthesia problems Neg Hx   . Hypotension Neg Hx   . Malignant hyperthermia Neg Hx   . Pseudochol deficiency Neg Hx     Social History:  reports that she quit smoking about 16 years ago. Her smoking use included cigarettes. She has a 3.00 pack-year smoking history. She has never used smokeless tobacco. She reports current alcohol use. She reports that she does not use drugs.  ROS    NEUROPATHY: She has had tingling in feet and legs especially at night. She is taking gabapentin without consistent relief at night Also she will take Lyrica with somewhat better relief at night This was also prescribed as needed for fibromyalgia by her rheumatologist  Hyperlipidemia:   The lipid abnormality consists of elevated LDL persistently Did not tolerate Crestor or lovastatin because of muscle cramps  She is off Zetia presumably because she was having muscle cramps  Her lipids are poorly controlled without any treatment She was started on Repatha on her last visit but did not come back for follow-up and has not had a refill  LDL results as follows:  Lab Results  Component Value Date   CHOL 226 (H) 12/28/2019   CHOL 240 (H) 09/14/2019   CHOL 250 (H) 10/14/2018   Lab Results   Component Value Date   HDL 55.70 12/28/2019   HDL 47.90 09/14/2019   HDL 51.40 10/14/2018   Lab Results  Component Value Date   LDLCALC 153 (H) 12/28/2019   LDLCALC 169 (H) 09/14/2019   LDLCALC 169 (H) 10/14/2018   Lab Results  Component Value Date   TRIG 84.0 12/28/2019   TRIG 117.0 09/14/2019   TRIG 147.0 10/14/2018   Lab Results  Component Value Date   CHOLHDL 4 12/28/2019   CHOLHDL 5 09/14/2019   CHOLHDL 5 10/14/2018   Lab Results  Component Value Date   LDLDIRECT 166.0 02/09/2019   LDLDIRECT 115.0 12/18/2016   LDLDIRECT 134.0 10/10/2016       HYPERTENSION:  Has been present for several years.  she is on Avapro, Bystolic from PCP along with amlodipine Blood pressure is improved today  BP Readings from Last 3 Encounters:  01/11/20 122/72  09/20/19 (!) 177/88  09/16/19 (!) 150/70   . Has a history of leg and muscle cramps and takes magnesium supplements twice a day  Her PCP has prescribed potassium supplements and has taken 1 tablet daily Potassium as follows:  Lab Results  Component Value Date   K 3.5 12/28/2019    Knee pain: She still takes ibuprofen 800 mg twice daily from an orthopedic surgeon, not taking meloxicam    Hypothyroidism  She had a relatively high TSH as of 4/17 and was empirically given Synthroid 25 g Subsequently has been on 50 mcg Recently because of difficulty with her insurance she has not taken it until about a week ago  TSH history as follows   Lab Results  Component Value Date   TSH 4.76 (H) 12/28/2019   TSH 2.68 09/14/2019   TSH 3.74 02/09/2019   FREET4 1.06 09/14/2019   FREET4 0.88 02/09/2019   FREET4 0.97 10/14/2018         Examination:  BP 122/72 (BP Location: Left Arm, Patient Position: Sitting, Cuff Size: Normal)   Pulse 81   Temp 98 F (36.7 C)   Ht '5\' 5"'  (1.651 m)   Wt 169 lb (76.7 kg)   LMP  (LMP Unknown)   SpO2 95%   BMI 28.12 kg/m   Body mass index is 28.12 kg/m.     Assesment/Plan:    1. DIABETES type 2 on insulin  See history of present illness for detailed discussion of management, blood sugar patterns and problems identified  She is on basal bolus insulin and Farxiga 5 mg daily  A1c is still fairly good at 6.6  As discussed above she is not monitoring her blood sugar consistently and no pattern identified Still benefiting from [place] with improved A1c and generally good blood sugars And no need to especially check her sugars after lunch because this is her main meal and she will regularly ate dessert also  3. Hypertension: Blood pressure is improved  She will follow up with her PCP, continues to have low normal potassium despite her medications is not showing any diuretics   4.   Hypothyroidism: She is taking 50 mcg levothyroxine but because of irregular regimen she has not taking it and TSH is high  LIPIDS: She says that Repatha is covered but does not have a prescription and needs to be started back on his As before her LDL is still high   Follow-up in 3 months    There are no Patient Instructions on file for this visit.       [name] 01/11/2020, 11:21 AM

## 2020-01-12 DIAGNOSIS — M6281 Muscle weakness (generalized): Secondary | ICD-10-CM | POA: Diagnosis not present

## 2020-01-12 DIAGNOSIS — R279 Unspecified lack of coordination: Secondary | ICD-10-CM | POA: Diagnosis not present

## 2020-01-12 DIAGNOSIS — R262 Difficulty in walking, not elsewhere classified: Secondary | ICD-10-CM | POA: Diagnosis not present

## 2020-01-17 DIAGNOSIS — R279 Unspecified lack of coordination: Secondary | ICD-10-CM | POA: Diagnosis not present

## 2020-01-17 DIAGNOSIS — M6281 Muscle weakness (generalized): Secondary | ICD-10-CM | POA: Diagnosis not present

## 2020-01-17 DIAGNOSIS — R262 Difficulty in walking, not elsewhere classified: Secondary | ICD-10-CM | POA: Diagnosis not present

## 2020-01-18 DIAGNOSIS — R279 Unspecified lack of coordination: Secondary | ICD-10-CM | POA: Diagnosis not present

## 2020-01-18 DIAGNOSIS — M6281 Muscle weakness (generalized): Secondary | ICD-10-CM | POA: Diagnosis not present

## 2020-01-19 DIAGNOSIS — M6281 Muscle weakness (generalized): Secondary | ICD-10-CM | POA: Diagnosis not present

## 2020-01-19 DIAGNOSIS — R262 Difficulty in walking, not elsewhere classified: Secondary | ICD-10-CM | POA: Diagnosis not present

## 2020-01-19 DIAGNOSIS — R279 Unspecified lack of coordination: Secondary | ICD-10-CM | POA: Diagnosis not present

## 2020-01-21 DIAGNOSIS — R262 Difficulty in walking, not elsewhere classified: Secondary | ICD-10-CM | POA: Diagnosis not present

## 2020-01-21 DIAGNOSIS — R279 Unspecified lack of coordination: Secondary | ICD-10-CM | POA: Diagnosis not present

## 2020-01-21 DIAGNOSIS — M6281 Muscle weakness (generalized): Secondary | ICD-10-CM | POA: Diagnosis not present

## 2020-01-26 DIAGNOSIS — R279 Unspecified lack of coordination: Secondary | ICD-10-CM | POA: Diagnosis not present

## 2020-01-26 DIAGNOSIS — M6281 Muscle weakness (generalized): Secondary | ICD-10-CM | POA: Diagnosis not present

## 2020-01-26 DIAGNOSIS — R262 Difficulty in walking, not elsewhere classified: Secondary | ICD-10-CM | POA: Diagnosis not present

## 2020-01-27 DIAGNOSIS — M6281 Muscle weakness (generalized): Secondary | ICD-10-CM | POA: Diagnosis not present

## 2020-01-27 DIAGNOSIS — R279 Unspecified lack of coordination: Secondary | ICD-10-CM | POA: Diagnosis not present

## 2020-01-27 DIAGNOSIS — R262 Difficulty in walking, not elsewhere classified: Secondary | ICD-10-CM | POA: Diagnosis not present

## 2020-01-28 DIAGNOSIS — R279 Unspecified lack of coordination: Secondary | ICD-10-CM | POA: Diagnosis not present

## 2020-01-28 DIAGNOSIS — M6281 Muscle weakness (generalized): Secondary | ICD-10-CM | POA: Diagnosis not present

## 2020-01-28 DIAGNOSIS — R262 Difficulty in walking, not elsewhere classified: Secondary | ICD-10-CM | POA: Diagnosis not present

## 2020-02-02 ENCOUNTER — Ambulatory Visit (INDEPENDENT_AMBULATORY_CARE_PROVIDER_SITE_OTHER): Payer: Medicare PPO | Admitting: Orthopaedic Surgery

## 2020-02-02 ENCOUNTER — Encounter: Payer: Self-pay | Admitting: Orthopaedic Surgery

## 2020-02-02 ENCOUNTER — Ambulatory Visit (INDEPENDENT_AMBULATORY_CARE_PROVIDER_SITE_OTHER): Payer: Medicare PPO

## 2020-02-02 DIAGNOSIS — T84012D Broken internal right knee prosthesis, subsequent encounter: Secondary | ICD-10-CM | POA: Diagnosis not present

## 2020-02-02 DIAGNOSIS — Z96651 Presence of right artificial knee joint: Secondary | ICD-10-CM

## 2020-02-02 MED ORDER — HYDROCODONE-ACETAMINOPHEN 5-325 MG PO TABS: 1.0000 | ORAL_TABLET | Freq: Four times a day (QID) | ORAL | 0 refills | Status: DC | PRN

## 2020-02-02 NOTE — Progress Notes (Signed)
The patient is a very pleasant 79 year old female that had a right total knee arthroplasty done in April of this year that was 6 months ago.  Postoperatively she developed valgus malalignment of that knee.  It is uncertain whether or not there was a ligamentous injury to the knee.  Immediately postoperatively she was doing quite well and then something may have occurred from therapy it is really not known.  At this point her knee is in valgus malalignment.  She ambulates with a rolling walker.  It is now detrimentally affecting her mobility.  On examination of her right knee she has good range of motion of the knee but there is definitely valgus malalignment that knee suggesting incompetence of the medial collateral ligament.  X-rays of the knees obtained and compared to immediate postoperative films.  The components themselves appear well seated but certainly there is valgus malalignment of the knee and there is a cortical irregularity around the medial femoral condyle that was not there at the time of surgery in the preoperative or immediate postoperative x-rays.  At this point given the malalignment we are recommending a revision arthroplasty of that knee.  I explained in detail what this would involve.  We will work on getting her on the schedule in the near future because I think this is what she will need to improve the function of that knee.  All questions and concerns were answered and addressed.  We will work on getting her on the schedule.  She knows that there will be some delay due to the COVID-19 pandemic I was just getting inpatient surgery is scheduled again and she definitely needs a stay after surgery.

## 2020-02-03 ENCOUNTER — Telehealth: Payer: Self-pay | Admitting: Orthopaedic Surgery

## 2020-02-03 NOTE — Telephone Encounter (Signed)
Pt called wanting to know if Dr.Blackman wanted her to continue with PT and taking walks while she waits to get scheduled for surgery? Pt would like a CB with an answer as she has a PT appt on 02/04/20  [phone] [facility] to leave VM on both numbers

## 2020-02-03 NOTE — Telephone Encounter (Signed)
She can stop therapy.  She can continue to take walks with her.

## 2020-02-04 NOTE — Telephone Encounter (Signed)
LMOM for patient of the below message  

## 2020-02-09 DIAGNOSIS — R262 Difficulty in walking, not elsewhere classified: Secondary | ICD-10-CM | POA: Diagnosis not present

## 2020-02-09 DIAGNOSIS — R279 Unspecified lack of coordination: Secondary | ICD-10-CM | POA: Diagnosis not present

## 2020-02-09 DIAGNOSIS — M6281 Muscle weakness (generalized): Secondary | ICD-10-CM | POA: Diagnosis not present

## 2020-02-11 DIAGNOSIS — R262 Difficulty in walking, not elsewhere classified: Secondary | ICD-10-CM | POA: Diagnosis not present

## 2020-02-11 DIAGNOSIS — R279 Unspecified lack of coordination: Secondary | ICD-10-CM | POA: Diagnosis not present

## 2020-02-11 DIAGNOSIS — M6281 Muscle weakness (generalized): Secondary | ICD-10-CM | POA: Diagnosis not present

## 2020-02-18 DIAGNOSIS — R262 Difficulty in walking, not elsewhere classified: Secondary | ICD-10-CM | POA: Diagnosis not present

## 2020-02-18 DIAGNOSIS — R279 Unspecified lack of coordination: Secondary | ICD-10-CM | POA: Diagnosis not present

## 2020-02-18 DIAGNOSIS — M6281 Muscle weakness (generalized): Secondary | ICD-10-CM | POA: Diagnosis not present

## 2020-02-19 DIAGNOSIS — Z03818 Encounter for observation for suspected exposure to other biological agents ruled out: Secondary | ICD-10-CM | POA: Diagnosis not present

## 2020-02-22 ENCOUNTER — Ambulatory Visit (INDEPENDENT_AMBULATORY_CARE_PROVIDER_SITE_OTHER): Payer: Medicare PPO | Admitting: Orthopaedic Surgery

## 2020-02-22 ENCOUNTER — Ambulatory Visit (INDEPENDENT_AMBULATORY_CARE_PROVIDER_SITE_OTHER): Payer: Medicare PPO

## 2020-02-22 ENCOUNTER — Encounter: Payer: Self-pay | Admitting: Orthopaedic Surgery

## 2020-02-22 DIAGNOSIS — Z96651 Presence of right artificial knee joint: Secondary | ICD-10-CM | POA: Diagnosis not present

## 2020-02-22 DIAGNOSIS — T84012D Broken internal right knee prosthesis, subsequent encounter: Secondary | ICD-10-CM

## 2020-02-22 NOTE — Progress Notes (Signed)
The patient is a very pleasant that I have been seeing for several years now.  Actually had to fix a femur fracture of her left femur a few years ago due to a fracture after a hard mechanical fall.  She had longstanding severe end-stage arthritis of her right knee with varus malalignment.  In April of this year I took her to the operating room and performed a right total knee arthroplasty.  The surgery itself was uncomplicated.  We actually used press-fit implants because in spite of her age of 74 we found very good bone quality and we had good experience with press-fit implants at the time.  She also was not on any osteoporosis medications and had done well with healing her femur fracture on her left side.  Postoperatively, her x-rays showed anatomic alignment of the implant with neutral alignment overall.  We saw her for the next few months going to physical therapy and her first 2 follow-up visits have been doing very well and I did not note any issues with the knee.  She did feel something may have happened during therapy but when I saw her in July I got new x-rays of the knee and even on my clinical exam the knee was then valgus malalignment.  Some cortical irregularity was noted around the medial femoral condyle and I recommended backing off of therapy and placing her in a hinged knee brace.  Since July I recommended a revision arthroplasty to improve the alignment of the knee.  She has been wearing her hinged knee brace but she had right knee pain after trying to get up off the floor this week.  New x-rays today continue to show valgus malalignment of the right knee with a cortical irregularity noticed around the medial femoral condyle.  The femoral and tibial components as well as the patella implant showed no complicating features.  I am still recommending a revision arthroplasty for this knee.  She is going to seek a second opinion and is going to schedule an opinion with Dr. Wynelle Link with [facility].  I was able to print out x-rays for her and agree with her seeking a second opinion as well.  I am happy to continue to see her in follow-up as well.

## 2020-02-23 DIAGNOSIS — R279 Unspecified lack of coordination: Secondary | ICD-10-CM | POA: Diagnosis not present

## 2020-02-23 DIAGNOSIS — R262 Difficulty in walking, not elsewhere classified: Secondary | ICD-10-CM | POA: Diagnosis not present

## 2020-02-23 DIAGNOSIS — M6281 Muscle weakness (generalized): Secondary | ICD-10-CM | POA: Diagnosis not present

## 2020-02-28 DIAGNOSIS — R279 Unspecified lack of coordination: Secondary | ICD-10-CM | POA: Diagnosis not present

## 2020-02-28 DIAGNOSIS — R262 Difficulty in walking, not elsewhere classified: Secondary | ICD-10-CM | POA: Diagnosis not present

## 2020-02-28 DIAGNOSIS — M6281 Muscle weakness (generalized): Secondary | ICD-10-CM | POA: Diagnosis not present

## 2020-03-03 DIAGNOSIS — I10 Essential (primary) hypertension: Secondary | ICD-10-CM | POA: Diagnosis not present

## 2020-03-03 DIAGNOSIS — R269 Unspecified abnormalities of gait and mobility: Secondary | ICD-10-CM | POA: Diagnosis not present

## 2020-03-03 DIAGNOSIS — J309 Allergic rhinitis, unspecified: Secondary | ICD-10-CM | POA: Diagnosis not present

## 2020-03-03 DIAGNOSIS — R262 Difficulty in walking, not elsewhere classified: Secondary | ICD-10-CM | POA: Diagnosis not present

## 2020-03-03 DIAGNOSIS — M6281 Muscle weakness (generalized): Secondary | ICD-10-CM | POA: Diagnosis not present

## 2020-03-03 DIAGNOSIS — E1142 Type 2 diabetes mellitus with diabetic polyneuropathy: Secondary | ICD-10-CM | POA: Diagnosis not present

## 2020-03-03 DIAGNOSIS — M1990 Unspecified osteoarthritis, unspecified site: Secondary | ICD-10-CM | POA: Diagnosis not present

## 2020-03-03 DIAGNOSIS — E7800 Pure hypercholesterolemia, unspecified: Secondary | ICD-10-CM | POA: Diagnosis not present

## 2020-03-03 DIAGNOSIS — E039 Hypothyroidism, unspecified: Secondary | ICD-10-CM | POA: Diagnosis not present

## 2020-03-03 DIAGNOSIS — R279 Unspecified lack of coordination: Secondary | ICD-10-CM | POA: Diagnosis not present

## 2020-03-03 DIAGNOSIS — M810 Age-related osteoporosis without current pathological fracture: Secondary | ICD-10-CM | POA: Diagnosis not present

## 2020-03-03 DIAGNOSIS — Z0000 Encounter for general adult medical examination without abnormal findings: Secondary | ICD-10-CM | POA: Diagnosis not present

## 2020-03-08 ENCOUNTER — Telehealth: Payer: Self-pay | Admitting: *Deleted

## 2020-03-08 NOTE — Telephone Encounter (Signed)
That is not acceptable.  Since she has Medicare she should be able to continue with her current meter

## 2020-03-08 NOTE — Telephone Encounter (Signed)
Noted, rx denied

## 2020-03-21 ENCOUNTER — Telehealth: Payer: Self-pay | Admitting: Orthopaedic Surgery

## 2020-03-21 NOTE — Telephone Encounter (Signed)
Pt called checking on a referral for Dr. Ricki Rodriguez who was supposed to be giving her a 2nd opinion on her knee; she would like a CB to update her the status of the referral  [phone]

## 2020-03-22 DIAGNOSIS — K8020 Calculus of gallbladder without cholecystitis without obstruction: Secondary | ICD-10-CM | POA: Diagnosis not present

## 2020-03-22 DIAGNOSIS — N281 Cyst of kidney, acquired: Secondary | ICD-10-CM | POA: Diagnosis not present

## 2020-03-22 DIAGNOSIS — E010 Iodine-deficiency related diffuse (endemic) goiter: Secondary | ICD-10-CM | POA: Diagnosis not present

## 2020-03-22 DIAGNOSIS — Q6100 Congenital renal cyst, unspecified: Secondary | ICD-10-CM | POA: Diagnosis not present

## 2020-03-22 NOTE — Telephone Encounter (Signed)
We sent her stuff to [facility], right?

## 2020-03-22 NOTE — Telephone Encounter (Signed)
Records faxed 11/3 per her request and I just refaxed.

## 2020-03-24 ENCOUNTER — Telehealth: Payer: Self-pay

## 2020-03-24 NOTE — Telephone Encounter (Signed)
Patient called she wants to schedule a appointment with Dr.Newton she got referred to Dr.Newton by a friend she stated her back,neck,head and shoulders are in pain. [phone]

## 2020-03-27 NOTE — Telephone Encounter (Signed)
Pt has an OV for Neck pain.

## 2020-03-27 NOTE — Telephone Encounter (Signed)
Called pt and lvm #1 

## 2020-03-27 NOTE — Telephone Encounter (Signed)
Pt has been seen by a few Dr here but she doesn't have a referral in the [facility]. Would you like a office visit?

## 2020-03-27 NOTE — Telephone Encounter (Signed)
She has seen Dr. Ninfa Linden for her hip. She can see him and he could make referral if he felt it was needed, OR we can Ov/consult but she has to know we are not pain management and are basically spine treatment.

## 2020-03-28 ENCOUNTER — Other Ambulatory Visit: Payer: Self-pay | Admitting: Gastroenterology

## 2020-03-28 DIAGNOSIS — Z86010 Personal history of colonic polyps: Secondary | ICD-10-CM | POA: Diagnosis not present

## 2020-03-28 DIAGNOSIS — K5900 Constipation, unspecified: Secondary | ICD-10-CM | POA: Diagnosis not present

## 2020-03-28 DIAGNOSIS — R1310 Dysphagia, unspecified: Secondary | ICD-10-CM

## 2020-03-28 DIAGNOSIS — K5730 Diverticulosis of large intestine without perforation or abscess without bleeding: Secondary | ICD-10-CM | POA: Diagnosis not present

## 2020-04-04 ENCOUNTER — Ambulatory Visit
Admission: RE | Admit: 2020-04-04 | Discharge: 2020-04-04 | Disposition: A | Discharge status: Home / Self Care | Payer: Medicare PPO | Source: Ambulatory Visit | Attending: Gastroenterology | Admitting: Gastroenterology

## 2020-04-04 DIAGNOSIS — K224 Dyskinesia of esophagus: Secondary | ICD-10-CM | POA: Diagnosis not present

## 2020-04-04 DIAGNOSIS — R1310 Dysphagia, unspecified: Secondary | ICD-10-CM

## 2020-04-05 ENCOUNTER — Other Ambulatory Visit: Payer: Self-pay | Admitting: Endocrinology

## 2020-04-06 DIAGNOSIS — Z96651 Presence of right artificial knee joint: Secondary | ICD-10-CM | POA: Diagnosis not present

## 2020-04-07 ENCOUNTER — Other Ambulatory Visit: Payer: Self-pay

## 2020-04-07 ENCOUNTER — Other Ambulatory Visit (INDEPENDENT_AMBULATORY_CARE_PROVIDER_SITE_OTHER): Payer: Medicare PPO

## 2020-04-07 DIAGNOSIS — E782 Mixed hyperlipidemia: Secondary | ICD-10-CM | POA: Diagnosis not present

## 2020-04-07 DIAGNOSIS — E1165 Type 2 diabetes mellitus with hyperglycemia: Secondary | ICD-10-CM | POA: Diagnosis not present

## 2020-04-07 DIAGNOSIS — Z794 Long term (current) use of insulin: Secondary | ICD-10-CM | POA: Diagnosis not present

## 2020-04-07 DIAGNOSIS — E063 Autoimmune thyroiditis: Secondary | ICD-10-CM

## 2020-04-07 LAB — LIPID PANEL
Cholesterol: 151 mg/dL (ref 0–200)
HDL: 56.1 mg/dL (ref >39.00)
LDL Cholesterol: 77 mg/dL (ref 0–99)
NonHDL: 94.83
Total CHOL/HDL Ratio: 3
Triglycerides: 91 mg/dL (ref 0.0–149.0)
VLDL: 18.2 mg/dL (ref 0.0–40.0)

## 2020-04-07 LAB — COMPREHENSIVE METABOLIC PANEL
ALT: 9 U/L (ref 0–35)
AST: 17 U/L (ref 0–37)
Albumin: 4.1 g/dL (ref 3.5–5.2)
Alkaline Phosphatase: 66 U/L (ref 39–117)
BUN: 16 mg/dL (ref 6–23)
CO2: 32 mEq/L (ref 19–32)
Calcium: 9.7 mg/dL (ref 8.4–10.5)
Chloride: 107 mEq/L (ref 96–112)
Creatinine, Ser: 1.04 mg/dL (ref 0.40–1.20)
GFR: 50.83 mL/min — ABNORMAL LOW (ref >60.00)
Glucose, Bld: 96 mg/dL (ref 70–99)
Potassium: 4.2 mEq/L (ref 3.5–5.1)
Sodium: 146 mEq/L — ABNORMAL HIGH (ref 135–145)
Total Bilirubin: 0.6 mg/dL (ref 0.2–1.2)
Total Protein: 6.8 g/dL (ref 6.0–8.3)

## 2020-04-07 LAB — MICROALBUMIN / CREATININE URINE RATIO
Creatinine,U: 65.9 mg/dL
Microalb Creat Ratio: 4.3 mg/g (ref 0.0–30.0)
Microalb, Ur: 2.8 mg/dL — ABNORMAL HIGH (ref 0.0–1.9)

## 2020-04-07 LAB — TSH: TSH: 1.56 u[IU]/mL (ref 0.35–4.50)

## 2020-04-07 LAB — HEMOGLOBIN A1C: Hgb A1c MFr Bld: 7.7 % — ABNORMAL HIGH (ref 4.6–6.5)

## 2020-04-10 NOTE — Progress Notes (Signed)
Patient ID: [name], female   DOB: Oct 03, 1940, 79 y.o.   MRN: [id number]      Reason for Appointment: Endocrinology follow-up  History of Present Illness    PROBLEM 1: Type 2 diabetes mellitus, date of diagnosis: 1998.   Prior history: She had previously been treated with Byetta, Glumetza, Amaryl, Victoza and  Onglyza However because of inadequate control and intolerance to drugs she was finally given pre-meal insulin along with Amaryl, Glumetza eventually stopped because of side effects and also Lantus added  Recent history:  The insulin regimen is: Novolog 6 at breakfast and 10 lunch, 8-9 acs Tresiba 22 units daily  Oral agents: Farxiga 5 mg daily  Her A1c is 7.7 vs 6.6, has been mostly between 5.9--7.4  Current blood sugar patterns and problems with management:  She has been going off her diet because of moving into a retirement home and changing her meals  She says she is eating desserts with every meal including pies and cakes  She has not adjusted her insulin much although will take a little more for larger meals  As before she is checking blood sugars very infrequently but they have been as high as 366  However she has occasional near normal readings in the morning or when she had her labs done midday  She does try to walk around the block although slowly  Also she has not received a prescription for Farxiga for last few days but otherwise is taking it regularly  No hypoglycemia  Side effects from diabetes medications: Victoza caused nausea.  Metformin  Monitors blood glucose: 1-2 times a day, readings with median values as below  Glucometer: One Touch ultra 2   PRE-MEAL Fasting Lunch Dinner Bedtime Overall  Glucose range:  98, 150  143, 182   146-366   Mean/median:     213  159   POST-MEAL PC Breakfast PC Lunch PC Dinner  Glucose range:   176   Mean/median:      Previous data:  PRE-MEAL Fasting Lunch Dinner  overnight Overall   Glucose range:  129-196  115   85-182  77-234  Mean/median:  151    135  144   POST-MEAL PC Breakfast PC Lunch PC Dinner  Glucose range:    234  Mean/median:       Certified Diabetes Educator visit: Most recent: 7/13.  Dietician visit: Most recent: 3/13.   Wt Readings from Last 3 Encounters:  04/11/20 180 lb 3.2 oz (81.7 kg)  01/11/20 169 lb (76.7 kg)  11/22/19 176 lb (12.8 kg)   Complications: Neuropathy  LABS:  Lab Results  Component Value Date   HGBA1C 7.7 (H) 04/07/2020   HGBA1C 6.6 (H) 12/28/2019   HGBA1C 5.9 09/14/2019   Lab Results  Component Value Date   MICROALBUR 2.8 (H) 04/07/2020   LDLCALC 77 04/07/2020   CREATININE 1.04 04/07/2020   Lab Results  Component Value Date   FRUCTOSAMINE 283 06/09/2018   FRUCTOSAMINE 296 (H) 05/16/2017   FRUCTOSAMINE 322 (H) 10/10/2016    Multiple other issues are addressed in review of systems  Lab on 04/07/2020  Component Date Value Ref Range Status  . TSH 04/07/2020 1.56  0.35 - 4.50 uIU/mL Final  . Cholesterol 04/07/2020 151  0 - 200 mg/dL Final   ATP III Classification       Desirable:  < 200 mg/dL               Borderline High:  200 - 239 mg/dL          High:  > = 240 mg/dL  . Triglycerides 04/07/2020 91.0  0.0 - 149.0 mg/dL Final   Normal:  <150 mg/dLBorderline High:  150 - 199 mg/dL  . HDL 04/07/2020 56.10  >39.00 mg/dL Final  . VLDL 04/07/2020 18.2  0.0 - 40.0 mg/dL Final  . LDL Cholesterol 04/07/2020 77  0 - 99 mg/dL Final  . Total CHOL/HDL Ratio 04/07/2020 3   Final                  Men          Women1/2 Average Risk     3.4          3.3Average Risk          5.0          4.42X Average Risk          9.6          7.13X Average Risk          15.0          11.0                      . NonHDL 04/07/2020 94.83   Final   NOTE:  Non-HDL goal should be 30 mg/dL higher than patient's LDL goal (i.e. LDL goal of < 70 mg/dL, would have non-HDL goal of < 100 mg/dL)  . Microalb, Ur 04/07/2020 2.8* 0.0 - 1.9 mg/dL Final  .  Creatinine,U 04/07/2020 65.9  mg/dL Final  . Microalb Creat Ratio 04/07/2020 4.3  0.0 - 30.0 mg/g Final  . Sodium 04/07/2020 146* 135 - 145 mEq/L Final  . Potassium 04/07/2020 4.2  3.5 - 5.1 mEq/L Final  . Chloride 04/07/2020 107  96 - 112 mEq/L Final  . CO2 04/07/2020 32  19 - 32 mEq/L Final  . Glucose, Bld 04/07/2020 96  70 - 99 mg/dL Final  . BUN 04/07/2020 16  6 - 23 mg/dL Final  . Creatinine, Ser 04/07/2020 1.04  0.40 - 1.20 mg/dL Final  . Total Bilirubin 04/07/2020 0.6  0.2 - 1.2 mg/dL Final  . Alkaline Phosphatase 04/07/2020 66  39 - 117 U/L Final  . AST 04/07/2020 17  0 - 37 U/L Final  . ALT 04/07/2020 9  0 - 35 U/L Final  . Total Protein 04/07/2020 6.8  6.0 - 8.3 g/dL Final  . Albumin 04/07/2020 4.1  3.5 - 5.2 g/dL Final  . GFR 04/07/2020 50.83* >60.00 mL/min Final   Calculated using the CKD-EPI Creatinine Equation (2021)  . Calcium 04/07/2020 9.7  8.4 - 10.5 mg/dL Final  . Hgb A1c MFr Bld 04/07/2020 7.7* 4.6 - 6.5 % Final   Glycemic Control Guidelines for People with Diabetes:Non Diabetic:  <6%Goal of Therapy: <7%Additional Action Suggested:  >8%       Allergies as of 04/11/2020      Reactions   Cymbalta [duloxetine Hcl] Diarrhea, Other (See Comments)   Dizziness, headache, irritability   Baclofen    jerks    Betadine [povidone Iodine] Swelling, Other (See Comments)   SWELLING REACTION DESCRIPTION/SEVERITY UNSPECIFIED  Reaction to betadine eye drops   Percocet [oxycodone-acetaminophen]    Confusion   Adhesive [tape] Rash   Lipitor [atorvastatin] Rash      Medication List       Accurate as of April 11, 2020 10:37 AM. If you have any questions, ask your nurse or doctor.  Accu-Chek Aviva Plus w/Device Kit Use Accu Chek Aviva as instructed to check blood sugar 4 times daily.   acetaminophen 500 MG tablet Commonly known as: TYLENOL Take 1,000 mg by mouth every 8 (eight) hours as needed for mild pain or moderate pain.   aspirin 81 MG chewable  tablet Chew 1 tablet (81 mg total) by mouth 2 (two) times daily.   BD Pen Needle Nano U/F 32G X 4 MM Misc Generic drug: Insulin Pen Needle Use to inject insulin 4 times daily.   Bystolic 5 MG tablet Generic drug: nebivolol Take 5 mg by mouth daily.   cetirizine 10 MG tablet Commonly known as: ZYRTEC Take 1 tablet (10 mg total) by mouth daily as needed for allergies.   dapagliflozin propanediol 5 MG Tabs tablet Commonly known as: Farxiga TAKE 1 TABLET EVERY DAY   fluticasone 50 MCG/ACT nasal spray Commonly known as: FLONASE Place 1 spray into both nostrils daily as needed for allergies. What changed:   how much to take  when to take this   gabapentin 600 MG tablet Commonly known as: NEURONTIN TAKE 1 TABLET(600 MG) BY MOUTH THREE TIMES DAILY What changed: See the new instructions.   glucose blood test strip Use Accu Chek Aviva test strips as instructed to check blood sugar 4 times daily.   HYDROcodone-acetaminophen 5-325 MG tablet Commonly known as: NORCO/VICODIN Take 1 tablet by mouth every 6 (six) hours as needed for moderate pain.   ibuprofen 800 MG tablet Commonly known as: ADVIL Take 1 tablet (800 mg total) by mouth every 8 (eight) hours as needed. What changed: reasons to take this   irbesartan 300 MG tablet Commonly known as: AVAPRO Take 300 mg by mouth daily.   ketoconazole 2 % cream Commonly known as: NIZORAL Apply 1 application topically See admin instructions. For 14 days for irritation when needed   levothyroxine 50 MCG tablet Commonly known as: SYNTHROID TAKE 1 TABLET(50 MCG) BY MOUTH DAILY   MAGNESIUM PO Take 250 mg by mouth 2 (two) times daily.   methocarbamol 500 MG tablet Commonly known as: ROBAXIN Take 1 tablet (500 mg total) by mouth every 6 (six) hours as needed for muscle spasms.   NovoLOG FlexPen 100 UNIT/ML FlexPen Generic drug: insulin aspart Inject 6 units under the skin before breakfast, 9 units at lunch, and 8-10 units at  dinner. 3-4 PRN for large carbs or ice cream. DX:E11.65   OCUVITE ADULT FORMULA PO Take 1 tablet by mouth daily.   multivitamin with minerals tablet Take 1 tablet by mouth daily.   potassium chloride 10 MEQ tablet Commonly known as: KLOR-CON Take 10 mEq by mouth daily as needed (low potassium).   Repatha SureClick 416 MG/ML Soaj Generic drug: Evolocumab Inject contents of pen in stomach area every 2 weeks   Symbicort 80-4.5 MCG/ACT inhaler Generic drug: budesonide-formoterol Inhale 2 puffs into the lungs 2 (two) times daily as needed (shortness of breath).   Tyler Aas FlexTouch 100 UNIT/ML FlexTouch Pen Generic drug: insulin degludec INJECT 22 UNITS UNDER THE SKIN EVERY DAY   zinc gluconate 50 MG tablet Take 50 mg by mouth daily.       Allergies:  Allergies  Allergen Reactions  . Cymbalta [Duloxetine Hcl] Diarrhea and Other (See Comments)    Dizziness, headache, irritability  . Baclofen     jerks   . Betadine [Povidone Iodine] Swelling and Other (See Comments)    SWELLING REACTION DESCRIPTION/SEVERITY UNSPECIFIED  Reaction to betadine eye drops  . Percocet [Oxycodone-Acetaminophen]  Confusion   . Adhesive [Tape] Rash  . Lipitor [Atorvastatin] Rash    Past Medical History:  Diagnosis Date  . Acute blood loss anemia   . Acute encephalopathy 07/15/2016  . AKI (acute kidney injury) ([place])   . Anemia    has sickle cell trait  . Anxiety   . Arthritis   . Asthma    has used inhaler in past for asthmatic bronchitis, last time- early 2012  . Bilateral primary osteoarthritis of knee 03/26/2016  . Complication of anesthesia    wakes up shaking  . Diabetes mellitus   . Diabetes mellitus with neuropathy ([place]) 10/29/2012  . Diverticulitis   . Dyslipidemia   . Encephalopathy 06/2016   due to medications after surgery  . Fibromyalgia   . GERD (gastroesophageal reflux disease)    occas. use of  Prilosec  . Heart murmur    sees Dr. Montez Morita, last seen- early 2012  .  Herniated nucleus pulposus, L2-3 06/25/2016  . History of back surgery   . Hypertension    02/2010- stress test /w PCP  . Hypothyroidism   . Loose bowel movements 12/2016  . Lumbar radiculopathy 06/27/2016  . Lumbar stenosis with neurogenic claudication 03/17/2017  . Memory disorder 02/16/2016  . Myoclonic jerking   . Neuromuscular disorder ([facility])    lumbar radiculopathy, lumbago  . Nocturnal leg cramps 09/27/2014  . Osteoporosis 03/10/2014  . Pneumonia   . Sickle cell trait ([place])   . Sleep apnea    borderline sleep apnea, states she no longer uses, early 2012- stopped using   . Spinal stenosis of lumbar region with radiculopathy 02/28/2014  . Spondylolisthesis of lumbar region 03/19/2011  . Type II or unspecified type diabetes mellitus without mention of complication, uncontrolled 07/08/2013    Past Surgical History:  Procedure Laterality Date  . ABDOMINAL HYSTERECTOMY    . adb.cyst     ovarian cyst  . BACK SURGERY     2012, 2015 (3 total)  . BACK SURGERY  2018   02/24/2017  . COLONOSCOPY    . EYE SURGERY     macular degeneration treatment - injections  . FEMUR IM NAIL Left 06/13/2018   Procedure: INTRAMEDULLARY (IM) NAIL FEMORAL;  Surgeon: [name], MD;  Location: [facility];  Service: Orthopedics;  Laterality: Left;  . OVARIAN CYST SURGERY    . POSTERIOR LUMBAR FUSION 4 LEVEL N/A 03/17/2017   Procedure: Decompression of Lumbar One-Two with Thoracic Ten to Lumbar Two Fusion;  Surgeon: Kristeen Miss, MD;  Location: [facility];  Service: Neurosurgery;  Laterality: N/A;  Decompression of L1-2 with T10 to L2 Fusion  . TOTAL KNEE ARTHROPLASTY Right 08/13/2019   Procedure: RIGHT TOTAL KNEE ARTHROPLASTY;  Surgeon: [name], MD;  Location: [facility];  Service: Orthopedics;  Laterality: Right;    Family History  Problem Relation Age of Onset  . Ovarian cancer Mother   . Cancer - Prostate Father   . Breast cancer Paternal Aunt   . Multiple myeloma Paternal Aunt   .  Anesthesia problems Neg Hx   . Hypotension Neg Hx   . Malignant hyperthermia Neg Hx   . Pseudochol deficiency Neg Hx     Social History:  reports that she quit smoking about 16 years ago. Her smoking use included cigarettes. She has a 3.00 pack-year smoking history. She has never used smokeless tobacco. She reports current alcohol use. She reports that she does not use drugs.  ROS    NEUROPATHY: She  has had tingling in feet and legs especially at night. She is taking gabapentin without consistent relief at night Also she will take Lyrica with somewhat better relief at night This was also prescribed as needed for fibromyalgia by her rheumatologist  Hyperlipidemia:   The lipid abnormality consists of elevated LDL persistently Did not tolerate Crestor or lovastatin because of muscle cramps  She stopped Zetia presumably because she was having muscle cramps  She was started on Repatha on her last visit She is taking this regularly and LDL is much better now However she is complaining about painful injection with the medication which she is using her thigh  LDL results as follows:  Lab Results  Component Value Date   CHOL 151 04/07/2020   CHOL 226 (H) 12/28/2019   CHOL 240 (H) 09/14/2019   Lab Results  Component Value Date   HDL 56.10 04/07/2020   HDL 55.70 12/28/2019   HDL 47.90 09/14/2019   Lab Results  Component Value Date   LDLCALC 77 04/07/2020   LDLCALC 153 (H) 12/28/2019   LDLCALC 169 (H) 09/14/2019   Lab Results  Component Value Date   TRIG 91.0 04/07/2020   TRIG 84.0 12/28/2019   TRIG 117.0 09/14/2019   Lab Results  Component Value Date   CHOLHDL 3 04/07/2020   CHOLHDL 4 12/28/2019   CHOLHDL 5 09/14/2019   Lab Results  Component Value Date   LDLDIRECT 166.0 02/09/2019   LDLDIRECT 115.0 12/18/2016   LDLDIRECT 134.0 10/10/2016      HYPERTENSION:  Has been present for several years.  she is on Avapro 923 mg, Bystolic from PCP along with  amlodipine Blood pressure is lower than usual, also on Farxiga but has been out of it for the last few days  BP Readings from Last 3 Encounters:  04/11/20 110/60  01/11/20 122/72  09/20/19 (!) 177/88   . Has a history of leg and muscle cramps and takes magnesium supplements twice a day  Her PCP has prescribed potassium supplements and has taken 1 tablet daily Potassium as follows:  Lab Results  Component Value Date   K 4.2 04/07/2020      Hypothyroidism  She had a relatively high TSH as of 4/17 and was empirically given Synthroid 25 g Subsequently has been on 50 mcg which she takes regularly  She complains of feeling tired and sleepy but also has insomnia  TSH history as follows   Lab Results  Component Value Date   TSH 1.56 04/07/2020   TSH 4.76 (H) 12/28/2019   TSH 2.68 09/14/2019   FREET4 1.06 09/14/2019   FREET4 0.88 02/09/2019   FREET4 0.97 10/14/2018         Examination:   BP 110/60   Pulse 63   Ht '5\' 3"'  (1.6 m)   Wt 180 lb 3.2 oz (81.7 kg)   LMP  (LMP Unknown)   SpO2 93%   BMI 31.92 kg/m   Body mass index is 31.92 kg/m.     Assesment/Plan:   1. DIABETES type 2 on insulin  See history of present illness for detailed discussion of management, blood sugar patterns and problems identified  She is on basal bolus insulin and Farxiga 5 mg daily  A1c is much higher at 7.7  She has gained 10 to 12 pounds and with her poor diet is having worsening control also Likely has significant postprandial hyperglycemia but does not monitor blood sugars much at home  She think she can cut back  on her desserts and watch her diet consistently She also needs to check her blood sugars more regularly Consider using freestyle libre If she still has persistently high readings will need to consider increasing her mealtime dose Also she will go up to 10 mg on the [facility], since she has a supply coming from the mail order company she will take 2 of the 5 mg tablets for  now  3. Hypertension: Blood pressure is controlled but appears low normal Since she will be needing Farxiga 10 mg daily will as a precaution reduce her Avapro to half of the 300 mg tablet She needs to check blood pressure regularly at home   4.   Hypothyroidism: She is taking 50 mcg levothyroxine with normalization of TSH Her fatigue is likely related to insomnia   LIPIDS: She has finally started taking Repatha with excellent results and LDL 77 Since she is complaining of painful injections with the autoinjector she will switch to the monthly pump device which has a 29-gauge needle and start this 2 weeks from her last injection  Follow-up in 2-3 months    There are no Patient Instructions on file for this visit.       [name] 04/11/2020, 10:37 AM

## 2020-04-11 ENCOUNTER — Ambulatory Visit (INDEPENDENT_AMBULATORY_CARE_PROVIDER_SITE_OTHER): Payer: Medicare PPO | Admitting: Endocrinology

## 2020-04-11 ENCOUNTER — Other Ambulatory Visit: Payer: Self-pay

## 2020-04-11 VITALS — BP 114/78 | HR 63 | Ht 63.0 in | Wt 180.2 lb

## 2020-04-11 DIAGNOSIS — I10 Essential (primary) hypertension: Secondary | ICD-10-CM | POA: Diagnosis not present

## 2020-04-11 DIAGNOSIS — E782 Mixed hyperlipidemia: Secondary | ICD-10-CM | POA: Diagnosis not present

## 2020-04-11 DIAGNOSIS — E1165 Type 2 diabetes mellitus with hyperglycemia: Secondary | ICD-10-CM

## 2020-04-11 DIAGNOSIS — E063 Autoimmune thyroiditis: Secondary | ICD-10-CM | POA: Diagnosis not present

## 2020-04-11 DIAGNOSIS — Z794 Long term (current) use of insulin: Secondary | ICD-10-CM | POA: Diagnosis not present

## 2020-04-11 MED ORDER — REPATHA PUSHTRONEX SYSTEM 420 MG/3.5ML ~~LOC~~ SOCT: 420.0000 mg | SUBCUTANEOUS | 3 refills | Status: DC

## 2020-04-11 NOTE — Patient Instructions (Signed)
Take 2 pills of the 5mg  Farxiga  Take 1/2 Irbesartan dailly  Check BP weekly  Check blood sugars on waking up 3 days a week  Also check blood sugars about 2 hours after meals and do this after different meals by rotation  Recommended blood sugar levels on waking up are 90-130 and about 2 hours after meal is 130-160  Please bring your blood sugar monitor to each visit, thank you

## 2020-04-25 ENCOUNTER — Ambulatory Visit: Payer: Medicare PPO | Admitting: Physical Medicine and Rehabilitation

## 2020-04-25 ENCOUNTER — Encounter: Payer: Self-pay | Admitting: Physical Medicine and Rehabilitation

## 2020-04-25 ENCOUNTER — Ambulatory Visit (INDEPENDENT_AMBULATORY_CARE_PROVIDER_SITE_OTHER): Payer: Medicare PPO | Admitting: Physical Medicine and Rehabilitation

## 2020-04-25 ENCOUNTER — Telehealth: Payer: Self-pay | Admitting: Physical Medicine and Rehabilitation

## 2020-04-25 VITALS — BP 147/77 | HR 72

## 2020-04-25 DIAGNOSIS — M47812 Spondylosis without myelopathy or radiculopathy, cervical region: Secondary | ICD-10-CM | POA: Diagnosis not present

## 2020-04-25 DIAGNOSIS — M5412 Radiculopathy, cervical region: Secondary | ICD-10-CM | POA: Diagnosis not present

## 2020-04-25 DIAGNOSIS — G894 Chronic pain syndrome: Secondary | ICD-10-CM | POA: Diagnosis not present

## 2020-04-25 DIAGNOSIS — M797 Fibromyalgia: Secondary | ICD-10-CM | POA: Diagnosis not present

## 2020-04-25 DIAGNOSIS — M4802 Spinal stenosis, cervical region: Secondary | ICD-10-CM

## 2020-04-25 DIAGNOSIS — M542 Cervicalgia: Secondary | ICD-10-CM | POA: Diagnosis not present

## 2020-04-25 NOTE — Progress Notes (Signed)
Pt state neck pain that travels to her shoulder and arms on both side. Pt state it mostly on her right side. Pt state when she has to turn her neck she can feel pain shooting down her arms. Pt state when trying to get up from a chair she feels pain shooting down her arms and legs. Pt state sometimes the pain makes her feel weak. Pt has hx of back issues. Pt state some blending makes her neck pain worse.  Numeric Pain Rating Scale and Functional Assessment Average Pain 9 Pain Right Now 7 My pain is intermittent, sharp, stabbing and aching Pain is worse with: bending and some activites Pain improves with: heat/ice and medication   In the last MONTH (on 0-10 scale) has pain interfered with the following?  1. General activity like being  able to carry out your everyday physical activities such as walking, climbing stairs, carrying groceries, or moving a chair?  Rating(8)  2. Relation with others like being able to carry out your usual social activities and roles such as  activities at home, at work and in your community. Rating(7)  3. Enjoyment of life such that you have  been bothered by emotional problems such as feeling anxious, depressed or irritable?  Rating(5)

## 2020-05-05 NOTE — Telephone Encounter (Signed)
error 

## 2020-05-11 ENCOUNTER — Other Ambulatory Visit: Payer: Self-pay | Admitting: Endocrinology

## 2020-05-18 ENCOUNTER — Other Ambulatory Visit: Payer: Self-pay

## 2020-05-18 ENCOUNTER — Ambulatory Visit: Payer: Self-pay

## 2020-05-18 ENCOUNTER — Encounter: Payer: Self-pay | Admitting: Physical Medicine and Rehabilitation

## 2020-05-18 ENCOUNTER — Ambulatory Visit (INDEPENDENT_AMBULATORY_CARE_PROVIDER_SITE_OTHER): Payer: Medicare PPO | Admitting: Physical Medicine and Rehabilitation

## 2020-05-18 VITALS — BP 112/63 | HR 71

## 2020-05-18 DIAGNOSIS — K5730 Diverticulosis of large intestine without perforation or abscess without bleeding: Secondary | ICD-10-CM | POA: Insufficient documentation

## 2020-05-18 DIAGNOSIS — K580 Irritable bowel syndrome with diarrhea: Secondary | ICD-10-CM | POA: Insufficient documentation

## 2020-05-18 DIAGNOSIS — M5412 Radiculopathy, cervical region: Secondary | ICD-10-CM

## 2020-05-18 DIAGNOSIS — K219 Gastro-esophageal reflux disease without esophagitis: Secondary | ICD-10-CM | POA: Insufficient documentation

## 2020-05-18 MED ORDER — BETAMETHASONE SOD PHOS & ACET 6 (3-3) MG/ML IJ SUSP
12.0000 mg | Freq: Once | INTRAMUSCULAR | Status: AC
  Administered 2020-05-18: 12 mg

## 2020-05-18 NOTE — Patient Instructions (Signed)

## 2020-05-18 NOTE — Progress Notes (Signed)
Pt state neck pain that travels to both shoulders. Pt state when she turn her head and bend over makes the pain worse. Pt state she takes over the counter pain meds to help ease the pain.  Numeric Pain Rating Scale and Functional Assessment Average Pain 7   In the last MONTH (on 0-10 scale) has pain interfered with the following?  1. General activity like being  able to carry out your everyday physical activities such as walking, climbing stairs, carrying groceries, or moving a chair?  Rating(9)   +Driver, -BT, -Dye Allergies.

## 2020-05-23 DIAGNOSIS — G4733 Obstructive sleep apnea (adult) (pediatric): Secondary | ICD-10-CM | POA: Diagnosis not present

## 2020-05-23 DIAGNOSIS — I10 Essential (primary) hypertension: Secondary | ICD-10-CM | POA: Diagnosis not present

## 2020-05-23 DIAGNOSIS — E1142 Type 2 diabetes mellitus with diabetic polyneuropathy: Secondary | ICD-10-CM | POA: Diagnosis not present

## 2020-05-23 DIAGNOSIS — Z01818 Encounter for other preprocedural examination: Secondary | ICD-10-CM | POA: Diagnosis not present

## 2020-05-23 DIAGNOSIS — M179 Osteoarthritis of knee, unspecified: Secondary | ICD-10-CM | POA: Diagnosis not present

## 2020-05-23 DIAGNOSIS — Z136 Encounter for screening for cardiovascular disorders: Secondary | ICD-10-CM | POA: Diagnosis not present

## 2020-05-23 DIAGNOSIS — E1165 Type 2 diabetes mellitus with hyperglycemia: Secondary | ICD-10-CM | POA: Diagnosis not present

## 2020-05-23 NOTE — Procedures (Signed)
Cervical Epidural Steroid Injection - Interlaminar Approach with Fluoroscopic Guidance  Patient: [name]      Date of Birth: 1940-10-01 MRN: [id number] PCP: Wenda Low, MD      Visit Date: 05/18/2020   Universal Protocol:    Date/Time: 02/01/225:51 AM  Consent Given By: the patient  Position: PRONE  Additional Comments: Vital signs were monitored before and after the procedure. Patient was prepped and draped in the usual sterile fashion. The correct patient, procedure, and site was verified.   Injection Procedure Details:   Procedure diagnoses: Cervical radiculopathy [M54.12]    Meds Administered:  Meds ordered this encounter  Medications  . betamethasone acetate-betamethasone sodium phosphate (CELESTONE) injection 12 mg     Laterality: Left  Location/Site: C7-T1  Needle: 3.5 in., 20 ga. Tuohy  Needle Placement: Paramedian epidural space  Findings:  -Comments: Excellent flow of contrast into the epidural space.  Typically we use the [employer] cervical spine positioning system but the patient had a great deal of difficulty getting on the exam table and maneuvering Duda the patient's knee and deconditioning.  Ultimately we were able to do the injection with good positioning just on the table itself with a small pad underneath the upper chest with good cervical flexion.  Procedure Details: Using a paramedian approach from the side mentioned above, the region overlying the inferior lamina was localized under fluoroscopic visualization and the soft tissues overlying this structure were infiltrated with 4 ml. of 1% Lidocaine without Epinephrine. A # 20 gauge, Tuohy needle was inserted into the epidural space using a paramedian approach.  The epidural space was localized using loss of resistance along with contralateral oblique bi-planar fluoroscopic views.  After negative aspirate for air, blood, and CSF, a 2 ml. volume of Isovue-250 was injected into the epidural  space and the flow of contrast was observed. Radiographs were obtained for documentation purposes.   The injectate was administered into the level noted above.  Additional Comments:  No complications occurred Dressing: 2 x 2 sterile gauze and Band-Aid    Post-procedure details: Patient was observed during the procedure. Post-procedure instructions were reviewed.  Patient left the clinic in stable condition.

## 2020-05-23 NOTE — Progress Notes (Signed)
[name] - 80 y.o. female MRN [id number]  Date of birth: [birth year]  Office Visit Note: Visit Date: 05/18/2020 PCP: Wenda Low, MD Referred by: Wenda Low, MD  Subjective: Chief Complaint  Patient presents with  . Neck - Pain  . Right Shoulder - Pain  . Left Shoulder - Pain   HPI:  [name] is a 80 y.o. female who comes in today at the request of Dr. Laurence Spates for planned Midline C7-T1 Cervical epidural steroid injection with fluoroscopic guidance.  The patient has failed conservative care including home exercise, medications, time and activity modification.  This injection will be diagnostic and hopefully therapeutic.  Please see requesting physician notes for further details and justification.   Please note this is also noted in the procedure report of the patient have a great deal of difficulty obtaining good positioning on the fluoroscopic table.  We ultimately did away with the cervical positioning system and was still able to maintain good cervical flexion for the injection.  The patient was just in general very uncomfortable with the whole process.  If the injection does help in the future would provide some type of Valium or preprocedure antianxiolytic medication.  Consideration given to referral to Dr. Suella Broad at [facility] if this needed to be done with sedation outpatient.  ROS Otherwise per HPI.  Assessment & Plan: Visit Diagnoses:    ICD-10-CM   1. Cervical radiculopathy  M54.12 XR C-ARM NO REPORT    Epidural Steroid injection    betamethasone acetate-betamethasone sodium phosphate (CELESTONE) injection 12 mg    Plan: No additional findings.   Meds & Orders:  Meds ordered this encounter  Medications  . betamethasone acetate-betamethasone sodium phosphate (CELESTONE) injection 12 mg    Orders Placed This Encounter  Procedures  . XR C-ARM NO REPORT  . Epidural Steroid injection    Follow-up: Return if symptoms worsen or fail  to improve.   Procedures: No procedures performed  Cervical Epidural Steroid Injection - Interlaminar Approach with Fluoroscopic Guidance  Patient: [name]      Date of Birth: [birth year] MRN: [id number] PCP: Wenda Low, MD      Visit Date: 05/18/2020   Universal Protocol:    Date/Time: 02/01/225:51 AM  Consent Given By: the patient  Position: PRONE  Additional Comments: Vital signs were monitored before and after the procedure. Patient was prepped and draped in the usual sterile fashion. The correct patient, procedure, and site was verified.   Injection Procedure Details:   Procedure diagnoses: Cervical radiculopathy [M54.12]    Meds Administered:  Meds ordered this encounter  Medications  . betamethasone acetate-betamethasone sodium phosphate (CELESTONE) injection 12 mg     Laterality: Left  Location/Site: C7-T1  Needle: 3.5 in., 20 ga. Tuohy  Needle Placement: Paramedian epidural space  Findings:  -Comments: Excellent flow of contrast into the epidural space.  Typically we use the [employer] cervical spine positioning system but the patient had a great deal of difficulty getting on the exam table and maneuvering Duda the patient's knee and deconditioning.  Ultimately we were able to do the injection with good positioning just on the table itself with a small pad underneath the upper chest with good cervical flexion.  Procedure Details: Using a paramedian approach from the side mentioned above, the region overlying the inferior lamina was localized under fluoroscopic visualization and the soft tissues overlying this structure were infiltrated with 4 ml. of 1% Lidocaine without Epinephrine. A # 20 gauge,  Tuohy needle was inserted into the epidural space using a paramedian approach.  The epidural space was localized using loss of resistance along with contralateral oblique bi-planar fluoroscopic views.  After negative aspirate for air, blood, and CSF, a 2 ml.  volume of Isovue-250 was injected into the epidural space and the flow of contrast was observed. Radiographs were obtained for documentation purposes.   The injectate was administered into the level noted above.  Additional Comments:  No complications occurred Dressing: 2 x 2 sterile gauze and Band-Aid    Post-procedure details: Patient was observed during the procedure. Post-procedure instructions were reviewed.  Patient left the clinic in stable condition.     Clinical History: MRI CERVICAL SPINE WITHOUT CONTRAST  TECHNIQUE: Multiplanar, multisequence MR imaging of the cervical spine was performed. No intravenous contrast was administered.  COMPARISON:  MRI dated 05/25/2007  FINDINGS: Alignment: Physiologic.  Vertebrae: No fracture, evidence of discitis, or bone lesion.  Cord: Normal signal and morphology.  Posterior Fossa, vertebral arteries, paraspinal tissues: 4 cm nodule in the left lobe of the thyroid gland. This has increased from 3.4 cm in 2009. Bilateral thyroid nodules were biopsied at that time. Paraspinal soft tissues otherwise appear normal.  Disc levels:  C2-3: Negative.  C3-4: Tiny osteophytes to the left of midline with no neural impingement. Otherwise negative.  C4-5: Normal disc. Severe left facet arthritis, slightly progressed since the prior MRI. No foraminal stenosis.  C5-6: Broad-based soft disc protrusion, increased from a disc bulge on the prior study. Slight flattening of the ventral aspect of the spinal cord without myelopathy. Moderate right facet arthritis, new since the prior study. Moderate right foraminal stenosis  C6-7: Progressive degenerative disc disease since 2009 with a small broad-based disc bulge with a small protrusion and accompanying osteophytes extending into the right lateral recess and proximal right neural foramen with right foraminal stenosis, new since the prior study. Slight bilateral facet  arthritis.  C7-T1: Negative.  IMPRESSION: 1. Progressive degenerative disc disease at C5-6 and C6-7 with right foraminal stenosis at C5-6 and C6-7 which could affect the right C6 and C7 nerves respectively. 2. Progressive facet arthritis at C4-5 on the left and at C5-6 on the right. 3. No findings to suggest cervical spinal infection. 4. 4 cm nodule in the left lobe of the thyroid gland. This has increased from 3.4 cm in 2009. This has been previously evaluated with biopsy in 2009.   Electronically Signed   By: Lorriane Shire M.D.   On: 01/05/2019 13:38     Objective:  VS:  HT:    WT:   BMI:     BP:112/63  HR:71bpm  TEMP: ( )  RESP:  Physical Exam Vitals and nursing note reviewed.  Constitutional:      General: She is not in acute distress.    Appearance: Normal appearance. She is not ill-appearing.  HENT:     Head: Normocephalic and atraumatic.     Right Ear: External ear normal.     Left Ear: External ear normal.  Eyes:     Extraocular Movements: Extraocular movements intact.  Cardiovascular:     Rate and Rhythm: Normal rate.     Pulses: Normal pulses.  Musculoskeletal:     Cervical back: Tenderness present. No rigidity.     Right lower leg: No edema.     Left lower leg: No edema.     Comments: Patient has good strength in the upper extremities including 5 out of 5 strength in wrist  extension long finger flexion and APB.  There is no atrophy of the hands intrinsically.  There is a negative Hoffmann's test.   Lymphadenopathy:     Cervical: No cervical adenopathy.  Skin:    Findings: No erythema, lesion or rash.  Neurological:     General: No focal deficit present.     Mental Status: She is alert and oriented to person, place, and time.     Sensory: No sensory deficit.     Motor: No weakness or abnormal muscle tone.     Coordination: Coordination normal.  Psychiatric:        Mood and Affect: Mood normal.        Behavior: Behavior normal.       Imaging: No results found.

## 2020-05-30 ENCOUNTER — Telehealth: Payer: Self-pay | Admitting: Endocrinology

## 2020-05-30 NOTE — Telephone Encounter (Signed)
[facility] called to get a verbal order to relplace Repatha because it has been damaged. Will replace free of charge.  Ph# [phone]

## 2020-06-01 ENCOUNTER — Telehealth: Payer: Self-pay | Admitting: Endocrinology

## 2020-06-01 NOTE — Telephone Encounter (Signed)
[facility]y [facility] is calling asking for a verbal for the patient's Repatha - contact number is [phone] REF# [id number]  Patient reported it damaged or defective to the manufacturer and they will replace free of charge but it has to go through [employer].

## 2020-06-01 NOTE — Telephone Encounter (Signed)
Pt called because she wanted Dr to know her PCP reduced her medication Farxiga from 10mg  back to 5mg  because she was having weakness.  She also wanted Dr to know she got a steroid shot in her  back last week.

## 2020-06-02 DIAGNOSIS — T84092A Other mechanical complication of internal right knee prosthesis, initial encounter: Secondary | ICD-10-CM | POA: Diagnosis not present

## 2020-06-02 DIAGNOSIS — Z96651 Presence of right artificial knee joint: Secondary | ICD-10-CM | POA: Diagnosis not present

## 2020-06-02 NOTE — Telephone Encounter (Signed)
FYI

## 2020-06-02 NOTE — Telephone Encounter (Signed)
Noted.  Was she having low blood pressure when she was having weakness?  Is she having high blood sugars now?

## 2020-06-02 NOTE — Telephone Encounter (Signed)
LVM--to call the office back. 

## 2020-06-02 NOTE — Telephone Encounter (Signed)
pharmacy/medical supply trying to get a verbal order so that they can ship somethings to patient -

## 2020-06-05 NOTE — Telephone Encounter (Signed)
Need verbal order. Please advise

## 2020-06-05 NOTE — Telephone Encounter (Signed)
Please advise 

## 2020-06-05 NOTE — Telephone Encounter (Signed)
Okay to send verbal order for Repatha

## 2020-06-05 NOTE — Telephone Encounter (Signed)
Rx was faxed.

## 2020-06-12 ENCOUNTER — Other Ambulatory Visit: Payer: Self-pay

## 2020-06-12 ENCOUNTER — Other Ambulatory Visit (INDEPENDENT_AMBULATORY_CARE_PROVIDER_SITE_OTHER): Payer: Medicare PPO

## 2020-06-12 DIAGNOSIS — E1165 Type 2 diabetes mellitus with hyperglycemia: Secondary | ICD-10-CM | POA: Diagnosis not present

## 2020-06-12 DIAGNOSIS — Z794 Long term (current) use of insulin: Secondary | ICD-10-CM | POA: Diagnosis not present

## 2020-06-12 LAB — BASIC METABOLIC PANEL
BUN: 13 mg/dL (ref 6–23)
CO2: 30 mEq/L (ref 19–32)
Calcium: 10 mg/dL (ref 8.4–10.5)
Chloride: 105 mEq/L (ref 96–112)
Creatinine, Ser: 0.99 mg/dL (ref 0.40–1.20)
GFR: 53.86 mL/min — ABNORMAL LOW (ref >60.00)
Glucose, Bld: 69 mg/dL — ABNORMAL LOW (ref 70–99)
Potassium: 3.6 mEq/L (ref 3.5–5.1)
Sodium: 142 mEq/L (ref 135–145)

## 2020-06-12 LAB — HEMOGLOBIN A1C: Hgb A1c MFr Bld: 6.8 % — ABNORMAL HIGH (ref 4.6–6.5)

## 2020-06-13 DIAGNOSIS — G4733 Obstructive sleep apnea (adult) (pediatric): Secondary | ICD-10-CM | POA: Diagnosis not present

## 2020-06-13 DIAGNOSIS — Z794 Long term (current) use of insulin: Secondary | ICD-10-CM | POA: Diagnosis not present

## 2020-06-13 DIAGNOSIS — E1142 Type 2 diabetes mellitus with diabetic polyneuropathy: Secondary | ICD-10-CM | POA: Diagnosis not present

## 2020-06-13 DIAGNOSIS — E669 Obesity, unspecified: Secondary | ICD-10-CM | POA: Diagnosis not present

## 2020-06-13 DIAGNOSIS — I10 Essential (primary) hypertension: Secondary | ICD-10-CM | POA: Diagnosis not present

## 2020-06-14 ENCOUNTER — Ambulatory Visit: Payer: Medicare PPO | Admitting: Endocrinology

## 2020-06-14 ENCOUNTER — Other Ambulatory Visit: Payer: Self-pay

## 2020-06-14 ENCOUNTER — Ambulatory Visit (INDEPENDENT_AMBULATORY_CARE_PROVIDER_SITE_OTHER): Payer: Medicare PPO | Admitting: Endocrinology

## 2020-06-14 ENCOUNTER — Encounter: Payer: Self-pay | Admitting: Endocrinology

## 2020-06-14 VITALS — BP 128/80 | HR 68 | Ht 63.0 in | Wt 185.6 lb

## 2020-06-14 DIAGNOSIS — Z794 Long term (current) use of insulin: Secondary | ICD-10-CM

## 2020-06-14 DIAGNOSIS — E063 Autoimmune thyroiditis: Secondary | ICD-10-CM | POA: Diagnosis not present

## 2020-06-14 DIAGNOSIS — E1165 Type 2 diabetes mellitus with hyperglycemia: Secondary | ICD-10-CM | POA: Diagnosis not present

## 2020-06-14 DIAGNOSIS — E782 Mixed hyperlipidemia: Secondary | ICD-10-CM | POA: Diagnosis not present

## 2020-06-14 DIAGNOSIS — I10 Essential (primary) hypertension: Secondary | ICD-10-CM

## 2020-06-14 NOTE — Progress Notes (Signed)
Patient ID: [name], female   DOB: [birth year], 80 y.o.   MRN: [id number]      Reason for Appointment: Endocrinology follow-up  History of Present Illness    PROBLEM 1: Type 2 diabetes mellitus, date of diagnosis: 1998.   Prior history: She had previously been treated with Byetta, Glumetza, Amaryl, Victoza and  Onglyza However because of inadequate control and intolerance to drugs she was finally given pre-meal insulin along with Amaryl, Glumetza eventually stopped because of side effects and also Lantus added  Recent history:  The insulin regimen is: Novolog 6 at breakfast and 10 lunch, 8-9 acs Tresiba 24 units daily  Oral agents: Farxiga 5 mg daily  Her A1c is 6.8 and improved  The previous range 5.9--7.4  Current blood sugar patterns and problems with management:  She has been trying to improve her diet, previously had gained significant weight and blood sugars were much higher especially postprandially  However even though she has gained another 5 pounds her blood sugars are overall better  She is monitoring blood sugars very infrequently  Her lab glucose was 69 late morning and she had a low sugar episode of 62 after midnight once  More significantly fairly good but highest sugar is 174 overnight  With eating dessert she will sometimes take 4 units more NovoLog  She is not able to do much exercise because of knee pain  She was told to take 10 mg of Farxiga but a couple of weeks ago she felt weaker and her PCP told her to take 5 mg  Not clear if she had a lower blood pressure at that time  Her breakfast is usually oatmeal and bacon but may not eat daily Last renal function was normal  Side effects from diabetes medications: Victoza caused nausea.  Metformin  Monitors blood glucose: 1-2 times a day, readings with median values as below  Glucometer: One Touch ultra 2  Blood sugar for the last 2 weeks 62-174 range AVERAGE 124  Previous  data:  PRE-MEAL Fasting Lunch Dinner Bedtime Overall  Glucose range:  98, 150  143, 182   146-366   Mean/median:     213  159   POST-MEAL PC Breakfast PC Lunch PC Dinner  Glucose range:   176   Mean/median:       Certified Diabetes Educator visit: Most recent: 7/13.  Dietician visit: Most recent: 3/13.   Wt Readings from Last 3 Encounters:  06/14/20 185 lb 9.6 oz (84.2 kg)  04/11/20 180 lb 3.2 oz (81.7 kg)  01/11/20 169 lb (26.3 kg)   Complications: Neuropathy  LABS:  Lab Results  Component Value Date   HGBA1C 6.8 (H) 06/12/2020   HGBA1C 7.7 (H) 04/07/2020   HGBA1C 6.6 (H) 12/28/2019   Lab Results  Component Value Date   MICROALBUR 2.8 (H) 04/07/2020   LDLCALC 77 04/07/2020   CREATININE 0.99 06/12/2020   Lab Results  Component Value Date   FRUCTOSAMINE 283 06/09/2018   FRUCTOSAMINE 296 (H) 05/16/2017   FRUCTOSAMINE 322 (H) 10/10/2016    Multiple other issues are addressed in review of systems  Lab on 06/12/2020  Component Date Value Ref Range Status   Sodium 06/12/2020 142  135 - 145 mEq/L Final   Potassium 06/12/2020 3.6  3.5 - 5.1 mEq/L Final   Chloride 06/12/2020 105  96 - 112 mEq/L Final   CO2 06/12/2020 30  19 - 32 mEq/L Final   Glucose, Bld 06/12/2020 69* 70 -  99 mg/dL Final   BUN 06/12/2020 13  6 - 23 mg/dL Final   Creatinine, Ser 06/12/2020 0.99  0.40 - 1.20 mg/dL Final   GFR 06/12/2020 53.86* >60.00 mL/min Final   Calculated using the CKD-EPI Creatinine Equation (2021)   Calcium 06/12/2020 10.0  8.4 - 10.5 mg/dL Final   Hgb A1c MFr Bld 06/12/2020 6.8* 4.6 - 6.5 % Final   Glycemic Control Guidelines for People with Diabetes:Non Diabetic:  <6%Goal of Therapy: <7%Additional Action Suggested:  >8%       Allergies as of 06/14/2020      Reactions   Cymbalta [duloxetine Hcl] Diarrhea, Other (See Comments)   Dizziness, headache, irritability   Amlodipine Besylate Swelling   Baclofen    jerks    Betadine [povidone Iodine] Swelling, Other  (See Comments)   SWELLING REACTION DESCRIPTION/SEVERITY UNSPECIFIED  Reaction to betadine on eye lid and it swelled up   Erythromycin Base Other (See Comments)   unknown   Oxycodone Hcl    Percocet [oxycodone-acetaminophen] Other (See Comments)   Confusion change personality   Povidone-iodine Swelling   Soap Other (See Comments)   unknown   Statins Other (See Comments)   Muscle cramps   Adhesive [tape] Rash   Lipitor [atorvastatin] Rash      Medication List       Accurate as of June 14, 2020  3:27 PM. If you have any questions, ask your nurse or doctor.        Accu-Chek Aviva Plus w/Device Kit Use Accu Chek Aviva as instructed to check blood sugar 4 times daily.   acetaminophen 500 MG tablet Commonly known as: TYLENOL Take 1,000 mg by mouth every 8 (eight) hours as needed for mild pain or moderate pain.   aspirin 81 MG chewable tablet Chew 1 tablet (81 mg total) by mouth 2 (two) times daily.   BD Pen Needle Nano U/F 32G X 4 MM Misc Generic drug: Insulin Pen Needle Use to inject insulin 4 times daily.   Bystolic 5 MG tablet Generic drug: nebivolol Take 5 mg by mouth at bedtime. Nebivolol   Cal-Mag-Zinc-D Tabs Take 1 tablet by mouth daily. 330 mg /130 mg/ 5 mg /5 mg   cetirizine 10 MG tablet Commonly known as: ZYRTEC Take 1 tablet (10 mg total) by mouth daily as needed for allergies. What changed: when to take this   dapagliflozin propanediol 5 MG Tabs tablet Commonly known as: Farxiga TAKE 1 TABLET EVERY DAY What changed:   how much to take  how to take this  when to take this  additional instructions   fluticasone 50 MCG/ACT nasal spray Commonly known as: FLONASE Place 1 spray into both nostrils daily as needed for allergies. What changed:   how much to take  when to take this   gabapentin 600 MG tablet Commonly known as: NEURONTIN TAKE 1 TABLET(600 MG) BY MOUTH THREE TIMES DAILY What changed: See the new instructions.   glucose blood  test strip Use Accu Chek Aviva test strips as instructed to check blood sugar 4 times daily.   HYDROcodone-acetaminophen 5-325 MG tablet Commonly known as: NORCO/VICODIN Take 1 tablet by mouth every 6 (six) hours as needed for moderate pain.   ibuprofen 800 MG tablet Commonly known as: ADVIL Take 1 tablet (800 mg total) by mouth every 8 (eight) hours as needed. What changed: reasons to take this   irbesartan-hydrochlorothiazide 300-12.5 MG tablet Commonly known as: AVALIDE Take 1 tablet by mouth daily.   ketoconazole  2 % cream Commonly known as: NIZORAL Apply 1 application topically daily as needed (Itching).   levothyroxine 50 MCG tablet Commonly known as: SYNTHROID TAKE 1 TABLET(50 MCG) BY MOUTH DAILY What changed:   how much to take  how to take this  when to take this  additional instructions   meloxicam 15 MG tablet Commonly known as: MOBIC Take 15 mg by mouth daily.   NovoLOG FlexPen 100 UNIT/ML FlexPen Generic drug: insulin aspart INJECT 6 UNITS BEFORE BREAKFAST, 9 UNITS AT LUNCH, 8-10 UNITS AT DINNER, 3-4 UNITS AS NEEDED FOR LARGE CARBS OR ICE CREAM What changed:   how much to take  how to take this  when to take this  additional instructions   Repatha Pushtronex System 420 MG/3.5ML Soct Generic drug: Evolocumab with Infusor Inject 420 mg into the skin every 30 (thirty) days.   Symbicort 80-4.5 MCG/ACT inhaler Generic drug: budesonide-formoterol Inhale 2 puffs into the lungs 2 (two) times daily as needed (shortness of breath). What changed: when to take this   [place] FlexTouch 100 UNIT/ML FlexTouch Pen Generic drug: insulin degludec INJECT 22 UNITS UNDER THE SKIN EVERY DAY What changed:   how much to take  how to take this  when to take this  additional instructions       Allergies:  Allergies  Allergen Reactions   Cymbalta [Duloxetine Hcl] Diarrhea and Other (See Comments)    Dizziness, headache, irritability   Amlodipine  Besylate Swelling   Baclofen     jerks    Betadine [Povidone Iodine] Swelling and Other (See Comments)    SWELLING REACTION DESCRIPTION/SEVERITY UNSPECIFIED  Reaction to betadine on eye lid and it swelled up   Erythromycin Base Other (See Comments)    unknown   Oxycodone Hcl    Percocet [Oxycodone-Acetaminophen] Other (See Comments)    Confusion change personality   Povidone-Iodine Swelling   Soap Other (See Comments)    unknown   Statins Other (See Comments)    Muscle cramps   Adhesive [Tape] Rash   Lipitor [Atorvastatin] Rash    Past Medical History:  Diagnosis Date   Acute blood loss anemia    Acute encephalopathy 07/15/2016   AKI (acute kidney injury) ([place])    Anemia    has sickle cell trait   Anxiety    Arthritis    Asthma    has used inhaler in past for asthmatic bronchitis, last time- early 2012   Bilateral primary osteoarthritis of knee 94/10/960   Complication of anesthesia    wakes up shaking   Diabetes mellitus    Diabetes mellitus with neuropathy ([place]) 10/29/2012   Diverticulitis    Dyslipidemia    Encephalopathy 06/2016   due to medications after surgery   Fibromyalgia    GERD (gastroesophageal reflux disease)    occas. use of  Prilosec   Heart murmur    sees Dr. Montez Morita, last seen- early 2012   Herniated nucleus pulposus, L2-3 06/25/2016   History of back surgery    Hypertension    02/2010- stress test /w PCP   Hypothyroidism    Loose bowel movements 12/2016   Lumbar radiculopathy 06/27/2016   Lumbar stenosis with neurogenic claudication 03/17/2017   Memory disorder 02/16/2016   Myoclonic jerking    Neuromuscular disorder ([facility])    lumbar radiculopathy, lumbago   Nocturnal leg cramps 09/27/2014   Osteoporosis 03/10/2014   Pneumonia    Sickle cell trait ([place])    Sleep apnea    borderline sleep  apnea, states she no longer uses, early 2012- stopped using    Spinal stenosis of lumbar region with radiculopathy  02/28/2014   Spondylolisthesis of lumbar region 03/19/2011   Type II or unspecified type diabetes mellitus without mention of complication, uncontrolled 07/08/2013    Past Surgical History:  Procedure Laterality Date   ABDOMINAL HYSTERECTOMY     adb.cyst     ovarian cyst   BACK SURGERY     2012, 2015 (3 total)   BACK SURGERY  2018   02/24/2017   COLONOSCOPY     EYE SURGERY     macular degeneration treatment - injections   FEMUR IM NAIL Left 06/13/2018   Procedure: INTRAMEDULLARY (IM) NAIL FEMORAL;  Surgeon: [name], MD;  Location: [facility];  Service: Orthopedics;  Laterality: Left;   OVARIAN CYST SURGERY     POSTERIOR LUMBAR FUSION 4 LEVEL N/A 03/17/2017   Procedure: Decompression of Lumbar One-Two with Thoracic Ten to Lumbar Two Fusion;  Surgeon: Kristeen Miss, MD;  Location: [facility];  Service: Neurosurgery;  Laterality: N/A;  Decompression of L1-2 with T10 to L2 Fusion   TOTAL KNEE ARTHROPLASTY Right 08/13/2019   Procedure: RIGHT TOTAL KNEE ARTHROPLASTY;  Surgeon: [name], MD;  Location: [facility];  Service: Orthopedics;  Laterality: Right;    Family History  Problem Relation Age of Onset   Ovarian cancer Mother    Cancer - Prostate Father    Breast cancer Paternal Aunt    Multiple myeloma Paternal Aunt    Anesthesia problems Neg Hx    Hypotension Neg Hx    Malignant hyperthermia Neg Hx    Pseudochol deficiency Neg Hx     Social History:  reports that she quit smoking about 17 years ago. Her smoking use included cigarettes. She has a 3.00 pack-year smoking history. She has never used smokeless tobacco. She reports current alcohol use. She reports that she does not use drugs.  ROS    NEUROPATHY: She has had tingling in feet and legs especially at night. She is taking gabapentin   Hyperlipidemia:   The lipid abnormality consists of elevated LDL persistently Did not tolerate Crestor or lovastatin because of muscle cramps  She  stopped Zetia presumably because she was having muscle cramps  She was started on Repatha in 2021 She is taking this regularly and LDL is much better  She was having some pain with the injection and is now using the monthly pump device  LDL results as follows:  Lab Results  Component Value Date   CHOL 151 04/07/2020   CHOL 226 (H) 12/28/2019   CHOL 240 (H) 09/14/2019   Lab Results  Component Value Date   HDL 56.10 04/07/2020   HDL 55.70 12/28/2019   HDL 47.90 09/14/2019   Lab Results  Component Value Date   LDLCALC 77 04/07/2020   LDLCALC 153 (H) 12/28/2019   LDLCALC 169 (H) 09/14/2019   Lab Results  Component Value Date   TRIG 91.0 04/07/2020   TRIG 84.0 12/28/2019   TRIG 117.0 09/14/2019   Lab Results  Component Value Date   CHOLHDL 3 04/07/2020   CHOLHDL 4 12/28/2019   CHOLHDL 5 09/14/2019   Lab Results  Component Value Date   LDLDIRECT 166.0 02/09/2019   LDLDIRECT 115.0 12/18/2016   LDLDIRECT 134.0 10/10/2016      HYPERTENSION:  Has been present for several years.  she is on Avalide 031 mg, Bystolic from PCP along with amlodipine Blood pressure levels appear  to fluctuate:  BP Readings from Last 3 Encounters:  06/14/20 128/80  05/18/20 112/63  04/25/20 (!) 147/77   . Has a history of leg and muscle cramps and takes magnesium supplements twice a day  No recent hypokalemia Lab Results  Component Value Date   K 3.6 06/12/2020      Hypothyroidism  She had a relatively high TSH as of 4/17 and was empirically given Synthroid 25 g Subsequently has been on 50 mcg which she takes regularly  She complains of feeling tired and sleepy, to be evaluated with sleep study  TSH history as follows   Lab Results  Component Value Date   TSH 1.56 04/07/2020   TSH 4.76 (H) 12/28/2019   TSH 2.68 09/14/2019   FREET4 1.06 09/14/2019   FREET4 0.88 02/09/2019   FREET4 0.97 10/14/2018         Examination:   BP 128/80    Pulse 68    Ht '5\' 3"'  (1.6 m)    Wt  185 lb 9.6 oz (84.2 kg)    LMP  (LMP Unknown)    SpO2 98%    BMI 32.88 kg/m   Body mass index is 32.88 kg/m.     Assesment/Plan:   1. DIABETES type 2 on insulin  See history of present illness for detailed discussion of management, blood sugar patterns and problems identified  She is on basal bolus insulin and Farxiga 5 mg daily  A1c is much better at 6.8  She is trying to improve her diet but has not lost any weight as yet Not clear why she was complaining of weakness with 10 mg [place]ade has no hypotension reported As before blood sugar monitoring is inadequate  Since she has had a couple of low sugars including overnight she will reduce her [place] Aas down to 22 units again Continue adjusting NovoLog dose based on meal size and carbohydrate intake Since she is not motivated to monitor her blood sugar regularly she will try the freestyle libre Discussed in detail how this works and information provided as well as need to check blood sugars 3-4 times a day manually Prescription will be faxed to [facility]  3. Hypertension: Blood pressure is controlled   4.   Hypothyroidism: She is taking 50 mcg levothyroxine with normalization of TSH  She will continue to work with her PCP regarding her fatigue and insomnia   LIPIDS: She has continued to take Repatha with excellent results and last LDL 77  She will continue the monthly pump device which has a 29-gauge needle and feels this monthly  Follow-up: 3 months    There are no Patient Instructions on file for this visit.       [name] 06/14/2020, 3:27 PM

## 2020-06-14 NOTE — Patient Instructions (Addendum)
Tresiba 22 units daily  Check blood sugars on waking up 3-4 days a week  Also check blood sugars about 2 hours after meals and do this after different meals by rotation  Recommended blood sugar levels on waking up are 90-130 and about 2 hours after meal is 130-180  Please bring your blood sugar monitor to each visit, thank you

## 2020-06-15 ENCOUNTER — Encounter: Payer: Self-pay | Admitting: Orthopaedic Surgery

## 2020-06-16 ENCOUNTER — Other Ambulatory Visit: Payer: Self-pay | Admitting: Endocrinology

## 2020-06-20 NOTE — Patient Instructions (Addendum)
DUE TO COVID-19 ONLY ONE VISITOR IS ALLOWED TO COME WITH YOU AND STAY IN THE WAITING ROOM ONLY DURING PRE OP AND PROCEDURE DAY OF SURGERY. THE 1 VISITOR  MAY VISIT WITH YOU AFTER SURGERY IN YOUR PRIVATE ROOM DURING VISITING HOURS ONLY!  YOU NEED TO HAVE A COVID 19 TEST ON: 07/01/20 @ 11:00 AM , THIS TEST MUST BE DONE BEFORE SURGERY,  COVID TESTING SITE [place] [address], IT IS ON THE RIGHT GOING OUT [address] APPROXIMATELY  2 MINUTES PAST [employer] ON THE RIGHT. ONCE YOUR COVID TEST IS COMPLETED,  PLEASE BEGIN THE QUARANTINE INSTRUCTIONS AS OUTLINED IN YOUR HANDOUT.                [name]    Your procedure is scheduled on: 07/05/20   Report to [facility] Main  Entrance   Report to admitting at: 9:20 AM     Call this number if you have problems the morning of surgery [phone]    Remember: Do not eat solid food :After Midnight. Clear liquids until: 8:50 am.  CLEAR LIQUID DIET  Foods Allowed                                                                     Foods Excluded  Coffee and tea, regular and decaf                             liquids that you cannot  Plain Jell-O any favor except red or purple                                           see through such as: Fruit ices (not with fruit pulp)                                     milk, soups, orange juice  Iced Popsicles                                    All solid food Carbonated beverages, regular and diet                                    Cranberry, grape and apple juices Sports drinks like Gatorade Lightly seasoned clear broth or consume(fat free) Sugar, honey syrup  Sample Menu Breakfast                                Lunch                                     Supper Cranberry juice  Beef broth                            Chicken broth Jell-O                                     Grape juice                           Apple juice Coffee or tea                         Jell-O                                      Popsicle                                                Coffee or tea                        Coffee or tea  _____________________________________________________________________  BRUSH YOUR TEETH MORNING OF SURGERY AND RINSE YOUR MOUTH OUT, NO CHEWING GUM CANDY OR MINTS.    Take these medicines the morning of surgery with A SIP OF WATER: gabapentin,synthroid.Use Flonase,cetirizine and inhalers as usual. How to Manage Your Diabetes Before and After Surgery  Why is it important to control my blood sugar before and after surgery? . Improving blood sugar levels before and after surgery helps healing and can limit problems. . A way of improving blood sugar control is eating a healthy diet by: o  Eating less sugar and carbohydrates o  Increasing activity/exercise o  Talking with your doctor about reaching your blood sugar goals . High blood sugars (greater than 180 mg/dL) can raise your risk of infections and slow your recovery, so you will need to focus on controlling your diabetes during the weeks before surgery. . Make sure that the doctor who takes care of your diabetes knows about your planned surgery including the date and location.  How do I manage my blood sugar before surgery? . Check your blood sugar at least 4 times a day, starting 2 days before surgery, to make sure that the level is not too high or low. o Check your blood sugar the morning of your surgery when you wake up and every 2 hours until you get to the Short Stay unit. . If your blood sugar is less than 70 mg/dL, you will need to treat for low blood sugar: o Do not take insulin. o Treat a low blood sugar (less than 70 mg/dL) with  cup of clear juice (cranberry or apple), 4 glucose tablets, OR glucose gel. o Recheck blood sugar in 15 minutes after treatment (to make sure it is greater than 70 mg/dL). If your blood sugar is not greater than 70 mg/dL on recheck, call  [phone] for further instructions. . Report your blood sugar to the short stay nurse when you get to Short Stay.  . If you are admitted to the hospital after surgery: o Your blood sugar will be checked by the staff and you will  probably be given insulin after surgery (instead of oral diabetes medicines) to make sure you have good blood sugar levels. o The goal for blood sugar control after surgery is 80-180 mg/dL.   WHAT DO I DO ABOUT MY DIABETES MEDICATION?  Marland Kitchen Do not take oral diabetes medicines (pills) the morning of surgery.  . THE DAY BEFORE SURGERY, take [place] as usual in the morning. Take Novolog as usual in the morning and afternoon,DO NOT take the bedtime dose. DO NOT take farxiga.     . THE MORNING OF SURGERY, . If your CBG is greater than 220 mg/dL, you may take  of your sliding scale  . (correction) dose of insulin.  DO NOT TAKE ANY DIABETIC MEDICATIONS DAY OF YOUR SURGERY                               You may not have any metal on your body including hair pins and              piercings  Do not wear jewelry, make-up, lotions, powders or perfumes, deodorant             Do not wear nail polish on your fingernails.  Do not shave  48 hours prior to surgery.    Do not bring valuables to the hospital. [place].  Contacts, dentures or bridgework may not be worn into surgery.  Leave suitcase in the car. After surgery it may be brought to your room.     Patients discharged the day of surgery will not be allowed to drive home. IF YOU ARE HAVING SURGERY AND GOING HOME THE SAME DAY, YOU MUST HAVE AN ADULT TO DRIVE YOU HOME AND BE WITH YOU FOR 24 HOURS. YOU MAY GO HOME BY TAXI OR UBER OR ORTHERWISE, BUT AN ADULT MUST ACCOMPANY YOU HOME AND STAY WITH YOU FOR 24 HOURS.  Name and phone number of your driver:  Special Instructions: N/A              Please read over the following fact sheets you were  given: _____________________________________________________________________       [facility] - Preparing for Surgery Before surgery, you can play an important role.  Because skin is not sterile, your skin needs to be as free of germs as possible.  You can reduce the number of germs on your skin by washing with CHG (chlorahexidine gluconate) soap before surgery.  CHG is an antiseptic cleaner which kills germs and bonds with the skin to continue killing germs even after washing. Please DO NOT use if you have an allergy to CHG or antibacterial soaps.  If your skin becomes reddened/irritated stop using the CHG and inform your nurse when you arrive at Short Stay. Do not shave (including legs and underarms) for at least 48 hours prior to the first CHG shower.  You may shave your face/neck. Please follow these instructions carefully:  1.  Shower with CHG Soap the night before surgery and the  morning of Surgery.  2.  If you choose to wash your hair, wash your hair first as usual with your  normal  shampoo.  3.  After you shampoo, rinse your hair and body thoroughly to remove the  shampoo.  4.  Use CHG as you would any other liquid soap.  You can apply chg directly  to the skin and wash                       Gently with a scrungie or clean washcloth.  5.  Apply the CHG Soap to your body ONLY FROM THE NECK DOWN.   Do not use on face/ open                           Wound or open sores. Avoid contact with eyes, ears mouth and genitals (private parts).                       Wash face,  Genitals (private parts) with your normal soap.             6.  Wash thoroughly, paying special attention to the area where your surgery  will be performed.  7.  Thoroughly rinse your body with warm water from the neck down.  8.  DO NOT shower/wash with your normal soap after using and rinsing off  the CHG Soap.                9.  Pat yourself dry with a clean towel.            10.  Wear clean  pajamas.            11.  Place clean sheets on your bed the night of your first shower and do not  sleep with pets. Day of Surgery : Do not apply any lotions/deodorants the morning of surgery.  Please wear clean clothes to the hospital/surgery center.  FAILURE TO FOLLOW THESE INSTRUCTIONS MAY RESULT IN THE CANCELLATION OF YOUR SURGERY PATIENT SIGNATURE_________________________________  NURSE SIGNATURE__________________________________  ________________________________________________________________________   Adam Phenix  An incentive spirometer is a tool that can help keep your lungs clear and active. This tool measures how well you are filling your lungs with each breath. Taking long deep breaths may help reverse or decrease the chance of developing breathing (pulmonary) problems (especially infection) following:  A long period of time when you are unable to move or be active. BEFORE THE PROCEDURE   If the spirometer includes an indicator to show your best effort, your nurse or respiratory therapist will set it to a desired goal.  If possible, sit up straight or lean slightly forward. Try not to slouch.  Hold the incentive spirometer in an upright position. INSTRUCTIONS FOR USE  1. Sit on the edge of your bed if possible, or sit up as far as you can in bed or on a chair. 2. Hold the incentive spirometer in an upright position. 3. Breathe out normally. 4. Place the mouthpiece in your mouth and seal your lips tightly around it. 5. Breathe in slowly and as deeply as possible, raising the piston or the ball toward the top of the column. 6. Hold your breath for 3-5 seconds or for as long as possible. Allow the piston or ball to fall to the bottom of the column. 7. Remove the mouthpiece from your mouth and breathe out normally. 8. Rest for a few seconds and repeat Steps 1 through 7 at least 10 times every 1-2 hours when you are awake. Take your time and take a few normal breaths  between deep breaths. 9. The spirometer may include an indicator to  show your best effort. Use the indicator as a goal to work toward during each repetition. 10. After each set of 10 deep breaths, practice coughing to be sure your lungs are clear. If you have an incision (the cut made at the time of surgery), support your incision when coughing by placing a pillow or rolled up towels firmly against it. Once you are able to get out of bed, walk around indoors and cough well. You may stop using the incentive spirometer when instructed by your caregiver.  RISKS AND COMPLICATIONS  Take your time so you do not get dizzy or light-headed.  If you are in pain, you may need to take or ask for pain medication before doing incentive spirometry. It is harder to take a deep breath if you are having pain. AFTER USE  Rest and breathe slowly and easily.  It can be helpful to keep track of a log of your progress. Your caregiver can provide you with a simple table to help with this. If you are using the spirometer at home, follow these instructions: [place] IF:   You are having difficultly using the spirometer.  You have trouble using the spirometer as often as instructed.  Your pain medication is not giving enough relief while using the spirometer.  You develop fever of 100.5 F (38.1 C) or higher. SEEK IMMEDIATE MEDICAL CARE IF:   You cough up bloody sputum that had not been present before.  You develop fever of 102 F (38.9 C) or greater.  You develop worsening pain at or near the incision site. MAKE SURE YOU:   Understand these instructions.  Will watch your condition.  Will get help right away if you are not doing well or get worse. Document Released: 08/19/2006 Document Revised: 07/01/2011 Document Reviewed: 10/20/2006 [facility] Patient Information 2014 [place], [place].   ________________________________________________________________________

## 2020-06-22 ENCOUNTER — Other Ambulatory Visit: Payer: Self-pay

## 2020-06-22 ENCOUNTER — Encounter (HOSPITAL_COMMUNITY)
Admission: RE | Admit: 2020-06-22 | Discharge: 2020-06-22 | Disposition: A | Discharge status: Home / Self Care | Payer: Medicare PPO | Source: Ambulatory Visit | Attending: Orthopedic Surgery | Admitting: Orthopedic Surgery

## 2020-06-22 ENCOUNTER — Encounter (HOSPITAL_COMMUNITY): Payer: Self-pay

## 2020-06-22 DIAGNOSIS — Z01812 Encounter for preprocedural laboratory examination: Secondary | ICD-10-CM | POA: Diagnosis not present

## 2020-06-22 HISTORY — DX: Dyspnea, unspecified: R06.00

## 2020-06-22 LAB — PROTIME-INR
INR: 1 (ref 0.8–1.2)
Prothrombin Time: 12.8 seconds (ref 11.4–15.2)

## 2020-06-22 LAB — COMPREHENSIVE METABOLIC PANEL
ALT: 12 U/L (ref 0–44)
AST: 16 U/L (ref 15–41)
Albumin: 4.3 g/dL (ref 3.5–5.0)
Alkaline Phosphatase: 63 U/L (ref 38–126)
Anion gap: 8 (ref 5–15)
BUN: 22 mg/dL (ref 8–23)
CO2: 30 mmol/L (ref 22–32)
Calcium: 9.8 mg/dL (ref 8.9–10.3)
Chloride: 105 mmol/L (ref 98–111)
Creatinine, Ser: 1.02 mg/dL — ABNORMAL HIGH (ref 0.44–1.00)
GFR, Estimated: 56 mL/min — ABNORMAL LOW (ref >60)
Glucose, Bld: 68 mg/dL — ABNORMAL LOW (ref 70–99)
Potassium: 3.9 mmol/L (ref 3.5–5.1)
Sodium: 143 mmol/L (ref 135–145)
Total Bilirubin: 0.9 mg/dL (ref 0.3–1.2)
Total Protein: 7 g/dL (ref 6.5–8.1)

## 2020-06-22 LAB — CBC
HCT: 43.4 % (ref 36.0–46.0)
Hemoglobin: 14.1 g/dL (ref 12.0–15.0)
MCH: 28.2 pg (ref 26.0–34.0)
MCHC: 32.5 g/dL (ref 30.0–36.0)
MCV: 86.8 fL (ref 80.0–100.0)
Platelets: 194 10*3/uL (ref 150–400)
RBC: 5 MIL/uL (ref 3.87–5.11)
RDW: 14 % (ref 11.5–15.5)
WBC: 6.2 10*3/uL (ref 4.0–10.5)
nRBC: 0 % (ref 0.0–0.2)

## 2020-06-22 LAB — HEMOGLOBIN A1C
Hgb A1c MFr Bld: 6.9 % — ABNORMAL HIGH (ref 4.8–5.6)
Mean Plasma Glucose: 151.33 mg/dL

## 2020-06-22 LAB — TYPE AND SCREEN
ABO/RH(D): B POS
Antibody Screen: NEGATIVE

## 2020-06-22 LAB — SURGICAL PCR SCREEN
MRSA, PCR: NEGATIVE
Staphylococcus aureus: POSITIVE — AB

## 2020-06-22 LAB — APTT: aPTT: 28 seconds (ref 24–36)

## 2020-06-22 LAB — GLUCOSE, CAPILLARY: Glucose-Capillary: 118 mg/dL — ABNORMAL HIGH (ref 70–99)

## 2020-06-22 NOTE — Progress Notes (Signed)
COVID Vaccine Completed: Date COVID Vaccine completed: COVID vaccine manufacturer: [employer]   PCP - Dr. Wenda Low. : Clearance: 05/23/20: Chart. Cardiologist -   Chest x-ray -  EKG - 5/31/2 Stress Test -  ECHO - 2016 Cardiac Cath -  Pacemaker/ICD device last checked: A1-C: 6.8: 06/12/20 Sleep Study - Yes CPAP - No  Fasting Blood Sugar - 90's Checks Blood Sugar ___3__ times a day  Blood Thinner Instructions: Aspirin Instructions: Last Dose:  Anesthesia review: Hx: DIA,HTN,Heart murmur,OSA.  Patient denies shortness of breath, fever, cough and chest pain at PAT appointment   Patient verbalized understanding of instructions that were given to them at the PAT appointment. Patient was also instructed that they will need to review over the PAT instructions again at home before surgery.

## 2020-06-22 NOTE — Progress Notes (Signed)
PCR:  positive STAPH 

## 2020-06-26 ENCOUNTER — Other Ambulatory Visit (HOSPITAL_BASED_OUTPATIENT_CLINIC_OR_DEPARTMENT_OTHER): Payer: Self-pay

## 2020-06-26 DIAGNOSIS — G4719 Other hypersomnia: Secondary | ICD-10-CM

## 2020-06-26 DIAGNOSIS — G4733 Obstructive sleep apnea (adult) (pediatric): Secondary | ICD-10-CM

## 2020-06-29 ENCOUNTER — Other Ambulatory Visit: Payer: Self-pay | Admitting: Endocrinology

## 2020-07-01 ENCOUNTER — Other Ambulatory Visit (HOSPITAL_COMMUNITY): Payer: Medicare PPO

## 2020-07-03 ENCOUNTER — Other Ambulatory Visit (HOSPITAL_COMMUNITY)
Admission: RE | Admit: 2020-07-03 | Discharge: 2020-07-03 | Disposition: A | Discharge status: Home / Self Care | Payer: Medicare PPO | Source: Ambulatory Visit | Attending: Orthopedic Surgery | Admitting: Orthopedic Surgery

## 2020-07-03 DIAGNOSIS — G8918 Other acute postprocedural pain: Secondary | ICD-10-CM | POA: Diagnosis not present

## 2020-07-03 DIAGNOSIS — I10 Essential (primary) hypertension: Secondary | ICD-10-CM | POA: Diagnosis present

## 2020-07-03 DIAGNOSIS — Z981 Arthrodesis status: Secondary | ICD-10-CM | POA: Diagnosis not present

## 2020-07-03 DIAGNOSIS — T84032A Mechanical loosening of internal right knee prosthetic joint, initial encounter: Secondary | ICD-10-CM | POA: Diagnosis present

## 2020-07-03 DIAGNOSIS — Z7982 Long term (current) use of aspirin: Secondary | ICD-10-CM | POA: Diagnosis not present

## 2020-07-03 DIAGNOSIS — Z79890 Hormone replacement therapy (postmenopausal): Secondary | ICD-10-CM | POA: Diagnosis not present

## 2020-07-03 DIAGNOSIS — Z7951 Long term (current) use of inhaled steroids: Secondary | ICD-10-CM | POA: Diagnosis not present

## 2020-07-03 DIAGNOSIS — Y792 Prosthetic and other implants, materials and accessory orthopedic devices associated with adverse incidents: Secondary | ICD-10-CM | POA: Diagnosis present

## 2020-07-03 DIAGNOSIS — T84092A Other mechanical complication of internal right knee prosthesis, initial encounter: Secondary | ICD-10-CM | POA: Diagnosis not present

## 2020-07-03 DIAGNOSIS — T8484XA Pain due to internal orthopedic prosthetic devices, implants and grafts, initial encounter: Secondary | ICD-10-CM | POA: Diagnosis present

## 2020-07-03 DIAGNOSIS — Z01812 Encounter for preprocedural laboratory examination: Secondary | ICD-10-CM | POA: Insufficient documentation

## 2020-07-03 DIAGNOSIS — Z20822 Contact with and (suspected) exposure to covid-19: Secondary | ICD-10-CM | POA: Insufficient documentation

## 2020-07-03 DIAGNOSIS — M810 Age-related osteoporosis without current pathological fracture: Secondary | ICD-10-CM | POA: Diagnosis present

## 2020-07-03 DIAGNOSIS — Z794 Long term (current) use of insulin: Secondary | ICD-10-CM | POA: Diagnosis not present

## 2020-07-03 DIAGNOSIS — M19049 Primary osteoarthritis, unspecified hand: Secondary | ICD-10-CM | POA: Diagnosis present

## 2020-07-03 DIAGNOSIS — D573 Sickle-cell trait: Secondary | ICD-10-CM | POA: Diagnosis present

## 2020-07-03 DIAGNOSIS — E785 Hyperlipidemia, unspecified: Secondary | ICD-10-CM | POA: Diagnosis present

## 2020-07-03 DIAGNOSIS — K5730 Diverticulosis of large intestine without perforation or abscess without bleeding: Secondary | ICD-10-CM | POA: Diagnosis present

## 2020-07-03 DIAGNOSIS — J45909 Unspecified asthma, uncomplicated: Secondary | ICD-10-CM | POA: Diagnosis present

## 2020-07-03 DIAGNOSIS — M797 Fibromyalgia: Secondary | ICD-10-CM | POA: Diagnosis present

## 2020-07-03 DIAGNOSIS — E039 Hypothyroidism, unspecified: Secondary | ICD-10-CM | POA: Diagnosis present

## 2020-07-03 DIAGNOSIS — Z8701 Personal history of pneumonia (recurrent): Secondary | ICD-10-CM | POA: Diagnosis not present

## 2020-07-03 DIAGNOSIS — E118 Type 2 diabetes mellitus with unspecified complications: Secondary | ICD-10-CM | POA: Diagnosis not present

## 2020-07-03 DIAGNOSIS — Z79899 Other long term (current) drug therapy: Secondary | ICD-10-CM | POA: Diagnosis not present

## 2020-07-03 DIAGNOSIS — M21061 Valgus deformity, not elsewhere classified, right knee: Secondary | ICD-10-CM | POA: Diagnosis present

## 2020-07-03 DIAGNOSIS — E1142 Type 2 diabetes mellitus with diabetic polyneuropathy: Secondary | ICD-10-CM | POA: Diagnosis present

## 2020-07-03 DIAGNOSIS — Z87891 Personal history of nicotine dependence: Secondary | ICD-10-CM | POA: Diagnosis not present

## 2020-07-03 DIAGNOSIS — Z791 Long term (current) use of non-steroidal anti-inflammatories (NSAID): Secondary | ICD-10-CM | POA: Diagnosis not present

## 2020-07-03 DIAGNOSIS — K219 Gastro-esophageal reflux disease without esophagitis: Secondary | ICD-10-CM | POA: Diagnosis present

## 2020-07-03 LAB — SARS CORONAVIRUS 2 (TAT 6-24 HRS): SARS Coronavirus 2: NEGATIVE

## 2020-07-04 ENCOUNTER — Encounter (HOSPITAL_COMMUNITY): Payer: Self-pay | Admitting: Orthopedic Surgery

## 2020-07-04 MED ORDER — BUPIVACAINE LIPOSOME 1.3 % IJ SUSP
20.0000 mL | INTRAMUSCULAR | Status: DC
  Filled 2020-07-04: qty 20

## 2020-07-05 ENCOUNTER — Inpatient Hospital Stay (HOSPITAL_COMMUNITY)
Admission: RE | Admit: 2020-07-05 | Discharge: 2020-07-07 | DRG: 468 | Disposition: A | Discharge status: Skilled Nursing Facility | Payer: Medicare PPO | Attending: Orthopedic Surgery | Admitting: Orthopedic Surgery

## 2020-07-05 ENCOUNTER — Encounter (HOSPITAL_COMMUNITY)
Admission: RE | Disposition: A | Discharge status: Home / Self Care | Payer: Self-pay | Source: Home / Self Care | Attending: Orthopedic Surgery

## 2020-07-05 ENCOUNTER — Encounter (HOSPITAL_COMMUNITY): Payer: Self-pay | Admitting: Orthopedic Surgery

## 2020-07-05 ENCOUNTER — Inpatient Hospital Stay (HOSPITAL_COMMUNITY): Payer: Medicare PPO | Admitting: Physician Assistant

## 2020-07-05 ENCOUNTER — Inpatient Hospital Stay (HOSPITAL_COMMUNITY): Payer: Medicare PPO | Admitting: Anesthesiology

## 2020-07-05 ENCOUNTER — Other Ambulatory Visit: Payer: Self-pay

## 2020-07-05 DIAGNOSIS — T84012A Broken internal right knee prosthesis, initial encounter: Secondary | ICD-10-CM

## 2020-07-05 DIAGNOSIS — Z885 Allergy status to narcotic agent status: Secondary | ICD-10-CM

## 2020-07-05 DIAGNOSIS — Z7982 Long term (current) use of aspirin: Secondary | ICD-10-CM | POA: Diagnosis not present

## 2020-07-05 DIAGNOSIS — D573 Sickle-cell trait: Secondary | ICD-10-CM | POA: Diagnosis present

## 2020-07-05 DIAGNOSIS — E039 Hypothyroidism, unspecified: Secondary | ICD-10-CM | POA: Diagnosis present

## 2020-07-05 DIAGNOSIS — M21061 Valgus deformity, not elsewhere classified, right knee: Secondary | ICD-10-CM | POA: Diagnosis present

## 2020-07-05 DIAGNOSIS — J45909 Unspecified asthma, uncomplicated: Secondary | ICD-10-CM | POA: Diagnosis present

## 2020-07-05 DIAGNOSIS — Z20822 Contact with and (suspected) exposure to covid-19: Secondary | ICD-10-CM | POA: Diagnosis present

## 2020-07-05 DIAGNOSIS — T8484XA Pain due to internal orthopedic prosthetic devices, implants and grafts, initial encounter: Secondary | ICD-10-CM | POA: Diagnosis present

## 2020-07-05 DIAGNOSIS — Z981 Arthrodesis status: Secondary | ICD-10-CM | POA: Diagnosis not present

## 2020-07-05 DIAGNOSIS — I10 Essential (primary) hypertension: Secondary | ICD-10-CM | POA: Diagnosis present

## 2020-07-05 DIAGNOSIS — K5730 Diverticulosis of large intestine without perforation or abscess without bleeding: Secondary | ICD-10-CM | POA: Diagnosis present

## 2020-07-05 DIAGNOSIS — Z8701 Personal history of pneumonia (recurrent): Secondary | ICD-10-CM | POA: Diagnosis not present

## 2020-07-05 DIAGNOSIS — K219 Gastro-esophageal reflux disease without esophagitis: Secondary | ICD-10-CM | POA: Diagnosis present

## 2020-07-05 DIAGNOSIS — Z79899 Other long term (current) drug therapy: Secondary | ICD-10-CM

## 2020-07-05 DIAGNOSIS — Z7951 Long term (current) use of inhaled steroids: Secondary | ICD-10-CM

## 2020-07-05 DIAGNOSIS — Z96659 Presence of unspecified artificial knee joint: Secondary | ICD-10-CM

## 2020-07-05 DIAGNOSIS — M797 Fibromyalgia: Secondary | ICD-10-CM | POA: Diagnosis present

## 2020-07-05 DIAGNOSIS — M19049 Primary osteoarthritis, unspecified hand: Secondary | ICD-10-CM | POA: Diagnosis present

## 2020-07-05 DIAGNOSIS — E785 Hyperlipidemia, unspecified: Secondary | ICD-10-CM | POA: Diagnosis present

## 2020-07-05 DIAGNOSIS — T84032A Mechanical loosening of internal right knee prosthetic joint, initial encounter: Secondary | ICD-10-CM | POA: Diagnosis present

## 2020-07-05 DIAGNOSIS — T84018A Broken internal joint prosthesis, other site, initial encounter: Secondary | ICD-10-CM

## 2020-07-05 DIAGNOSIS — Z791 Long term (current) use of non-steroidal anti-inflammatories (NSAID): Secondary | ICD-10-CM | POA: Diagnosis not present

## 2020-07-05 DIAGNOSIS — Z794 Long term (current) use of insulin: Secondary | ICD-10-CM | POA: Diagnosis not present

## 2020-07-05 DIAGNOSIS — Z888 Allergy status to other drugs, medicaments and biological substances status: Secondary | ICD-10-CM

## 2020-07-05 DIAGNOSIS — Z87891 Personal history of nicotine dependence: Secondary | ICD-10-CM

## 2020-07-05 DIAGNOSIS — Z79890 Hormone replacement therapy (postmenopausal): Secondary | ICD-10-CM

## 2020-07-05 DIAGNOSIS — M810 Age-related osteoporosis without current pathological fracture: Secondary | ICD-10-CM | POA: Diagnosis present

## 2020-07-05 DIAGNOSIS — Z881 Allergy status to other antibiotic agents status: Secondary | ICD-10-CM

## 2020-07-05 DIAGNOSIS — T84092A Other mechanical complication of internal right knee prosthesis, initial encounter: Secondary | ICD-10-CM | POA: Diagnosis not present

## 2020-07-05 DIAGNOSIS — Z91048 Other nonmedicinal substance allergy status: Secondary | ICD-10-CM

## 2020-07-05 DIAGNOSIS — Z90710 Acquired absence of both cervix and uterus: Secondary | ICD-10-CM

## 2020-07-05 DIAGNOSIS — Y792 Prosthetic and other implants, materials and accessory orthopedic devices associated with adverse incidents: Secondary | ICD-10-CM | POA: Diagnosis present

## 2020-07-05 DIAGNOSIS — E1142 Type 2 diabetes mellitus with diabetic polyneuropathy: Secondary | ICD-10-CM | POA: Diagnosis present

## 2020-07-05 HISTORY — PX: TOTAL KNEE REVISION: SHX996

## 2020-07-05 LAB — GLUCOSE, CAPILLARY
Glucose-Capillary: 158 mg/dL — ABNORMAL HIGH (ref 70–99)
Glucose-Capillary: 188 mg/dL — ABNORMAL HIGH (ref 70–99)
Glucose-Capillary: 231 mg/dL — ABNORMAL HIGH (ref 70–99)

## 2020-07-05 SURGERY — TOTAL KNEE REVISION
Anesthesia: General | Site: Knee | Laterality: Right

## 2020-07-05 MED ORDER — INSULIN ASPART 100 UNIT/ML ~~LOC~~ SOLN
10.0000 [IU] | Freq: Every day | SUBCUTANEOUS | Status: DC
  Administered 2020-07-06: 10 [IU] via SUBCUTANEOUS

## 2020-07-05 MED ORDER — CEFAZOLIN SODIUM-DEXTROSE 2-4 GM/100ML-% IV SOLN
2.0000 g | INTRAVENOUS | Status: AC
  Administered 2020-07-05: 2 g via INTRAVENOUS
  Filled 2020-07-05: qty 100

## 2020-07-05 MED ORDER — ORAL CARE MOUTH RINSE: 15.0000 mL | Freq: Once | OROMUCOSAL | Status: AC

## 2020-07-05 MED ORDER — ASPIRIN EC 325 MG PO TBEC
325.0000 mg | DELAYED_RELEASE_TABLET | Freq: Two times a day (BID) | ORAL | Status: DC
  Administered 2020-07-06 – 2020-07-07 (×3): 325 mg via ORAL
  Filled 2020-07-05 (×3): qty 1

## 2020-07-05 MED ORDER — PHENOL 1.4 % MT LIQD: 1.0000 | OROMUCOSAL | Status: DC | PRN

## 2020-07-05 MED ORDER — FENTANYL CITRATE (PF) 100 MCG/2ML IJ SOLN
50.0000 ug | INTRAMUSCULAR | Status: AC
  Administered 2020-07-05: 25 ug via INTRAVENOUS
  Administered 2020-07-05: 75 ug via INTRAVENOUS
  Filled 2020-07-05: qty 2

## 2020-07-05 MED ORDER — MOMETASONE FURO-FORMOTEROL FUM 100-5 MCG/ACT IN AERO
2.0000 | INHALATION_SPRAY | Freq: Two times a day (BID) | RESPIRATORY_TRACT | Status: DC
  Administered 2020-07-05 – 2020-07-07 (×4): 2 via RESPIRATORY_TRACT
  Filled 2020-07-05: qty 8.8

## 2020-07-05 MED ORDER — FENTANYL CITRATE (PF) 100 MCG/2ML IJ SOLN
INTRAMUSCULAR | Status: DC | PRN
  Administered 2020-07-05: 25 ug via INTRAVENOUS
  Administered 2020-07-05: 50 ug via INTRAVENOUS
  Administered 2020-07-05: 25 ug via INTRAVENOUS

## 2020-07-05 MED ORDER — BUPIVACAINE LIPOSOME 1.3 % IJ SUSP
INTRAMUSCULAR | Status: DC | PRN
  Administered 2020-07-05: 80 mL

## 2020-07-05 MED ORDER — ONDANSETRON HCL 4 MG/2ML IJ SOLN: 4.0000 mg | Freq: Four times a day (QID) | INTRAMUSCULAR | Status: DC | PRN

## 2020-07-05 MED ORDER — NEBIVOLOL HCL 5 MG PO TABS
5.0000 mg | ORAL_TABLET | Freq: Every day | ORAL | Status: DC
  Administered 2020-07-05: 5 mg via ORAL
  Filled 2020-07-05 (×2): qty 1

## 2020-07-05 MED ORDER — HYDROCHLOROTHIAZIDE 12.5 MG PO CAPS
12.5000 mg | ORAL_CAPSULE | Freq: Every day | ORAL | Status: DC
  Administered 2020-07-06 – 2020-07-07 (×2): 12.5 mg via ORAL
  Filled 2020-07-05 (×2): qty 1

## 2020-07-05 MED ORDER — HYDROCODONE-ACETAMINOPHEN 5-325 MG PO TABS
1.0000 | ORAL_TABLET | ORAL | Status: DC | PRN
  Administered 2020-07-05 – 2020-07-07 (×2): 2 via ORAL
  Filled 2020-07-05 (×3): qty 2

## 2020-07-05 MED ORDER — ACETAMINOPHEN 500 MG PO TABS
500.0000 mg | ORAL_TABLET | Freq: Four times a day (QID) | ORAL | Status: AC
  Administered 2020-07-05: 500 mg via ORAL
  Filled 2020-07-05: qty 1

## 2020-07-05 MED ORDER — LACTATED RINGERS IV SOLN: INTRAVENOUS | Status: DC

## 2020-07-05 MED ORDER — HYDROCODONE-ACETAMINOPHEN 7.5-325 MG PO TABS
1.0000 | ORAL_TABLET | ORAL | Status: DC | PRN
  Administered 2020-07-06: 2 via ORAL
  Administered 2020-07-06: 1 via ORAL
  Administered 2020-07-06: 2 via ORAL
  Administered 2020-07-06: 1 via ORAL
  Administered 2020-07-06: 2 via ORAL
  Filled 2020-07-05 (×3): qty 2
  Filled 2020-07-05: qty 1
  Filled 2020-07-05: qty 2
  Filled 2020-07-05: qty 1

## 2020-07-05 MED ORDER — TRANEXAMIC ACID-NACL 1000-0.7 MG/100ML-% IV SOLN
1000.0000 mg | INTRAVENOUS | Status: AC
  Administered 2020-07-05: 1000 mg via INTRAVENOUS
  Filled 2020-07-05: qty 100

## 2020-07-05 MED ORDER — HYDROMORPHONE HCL 1 MG/ML IJ SOLN
0.2500 mg | INTRAMUSCULAR | Status: DC | PRN
  Administered 2020-07-05 (×4): 0.5 mg via INTRAVENOUS

## 2020-07-05 MED ORDER — BISACODYL 10 MG RE SUPP: 10.0000 mg | Freq: Every day | RECTAL | Status: DC | PRN

## 2020-07-05 MED ORDER — HYDROMORPHONE HCL 1 MG/ML IJ SOLN
INTRAMUSCULAR | Status: AC
  Filled 2020-07-05: qty 1

## 2020-07-05 MED ORDER — METHOCARBAMOL 500 MG IVPB - SIMPLE MED
INTRAVENOUS | Status: AC
  Filled 2020-07-05: qty 50

## 2020-07-05 MED ORDER — ONDANSETRON HCL 4 MG/2ML IJ SOLN
INTRAMUSCULAR | Status: DC | PRN
  Administered 2020-07-05: 4 mg via INTRAVENOUS

## 2020-07-05 MED ORDER — POVIDONE-IODINE 10 % EX SWAB: 2.0000 "application " | Freq: Once | CUTANEOUS | Status: DC

## 2020-07-05 MED ORDER — SODIUM CHLORIDE 0.9 % IV SOLN: INTRAVENOUS | Status: DC

## 2020-07-05 MED ORDER — METOCLOPRAMIDE HCL 5 MG PO TABS
5.0000 mg | ORAL_TABLET | Freq: Three times a day (TID) | ORAL | Status: DC | PRN
Start: 2020-07-05 — End: 2020-07-07

## 2020-07-05 MED ORDER — FLEET ENEMA 7-19 GM/118ML RE ENEM: 1.0000 | ENEMA | Freq: Once | RECTAL | Status: DC | PRN

## 2020-07-05 MED ORDER — PROPOFOL 10 MG/ML IV BOLUS
INTRAVENOUS | Status: DC | PRN
  Administered 2020-07-05: 200 mg via INTRAVENOUS

## 2020-07-05 MED ORDER — DOCUSATE SODIUM 100 MG PO CAPS
100.0000 mg | ORAL_CAPSULE | Freq: Two times a day (BID) | ORAL | Status: DC
  Administered 2020-07-05 – 2020-07-07 (×4): 100 mg via ORAL
  Filled 2020-07-05 (×4): qty 1

## 2020-07-05 MED ORDER — FLUTICASONE PROPIONATE 50 MCG/ACT NA SUSP
1.0000 | Freq: Every day | NASAL | Status: DC | PRN
  Filled 2020-07-05: qty 16

## 2020-07-05 MED ORDER — ACETAMINOPHEN 10 MG/ML IV SOLN
1000.0000 mg | Freq: Four times a day (QID) | INTRAVENOUS | Status: DC
  Administered 2020-07-05: 1000 mg via INTRAVENOUS
  Filled 2020-07-05: qty 100

## 2020-07-05 MED ORDER — DEXMEDETOMIDINE (PRECEDEX) IN NS 20 MCG/5ML (4 MCG/ML) IV SYRINGE
PREFILLED_SYRINGE | INTRAVENOUS | Status: DC | PRN
  Administered 2020-07-05 (×4): 4 ug via INTRAVENOUS

## 2020-07-05 MED ORDER — ONDANSETRON HCL 4 MG PO TABS: 4.0000 mg | ORAL_TABLET | Freq: Four times a day (QID) | ORAL | Status: DC | PRN

## 2020-07-05 MED ORDER — INSULIN ASPART 100 UNIT/ML ~~LOC~~ SOLN
9.0000 [IU] | Freq: Every day | SUBCUTANEOUS | Status: DC
  Administered 2020-07-05 – 2020-07-06 (×2): 9 [IU] via SUBCUTANEOUS

## 2020-07-05 MED ORDER — CEFAZOLIN SODIUM-DEXTROSE 2-4 GM/100ML-% IV SOLN
2.0000 g | Freq: Four times a day (QID) | INTRAVENOUS | Status: AC
Start: 2020-07-05 — End: 2020-07-06
  Administered 2020-07-05 – 2020-07-06 (×2): 2 g via INTRAVENOUS
  Filled 2020-07-05 (×2): qty 100

## 2020-07-05 MED ORDER — LIDOCAINE HCL (CARDIAC) PF 100 MG/5ML IV SOSY
PREFILLED_SYRINGE | INTRAVENOUS | Status: DC | PRN
  Administered 2020-07-05: 100 mg via INTRAVENOUS

## 2020-07-05 MED ORDER — LIDOCAINE 2% (20 MG/ML) 5 ML SYRINGE
INTRAMUSCULAR | Status: AC
  Filled 2020-07-05: qty 15

## 2020-07-05 MED ORDER — LORATADINE 10 MG PO TABS
10.0000 mg | ORAL_TABLET | Freq: Every day | ORAL | Status: DC
  Administered 2020-07-06 – 2020-07-07 (×2): 10 mg via ORAL
  Filled 2020-07-05 (×2): qty 1

## 2020-07-05 MED ORDER — HYDROMORPHONE HCL 2 MG/ML IJ SOLN
INTRAMUSCULAR | Status: AC
  Filled 2020-07-05: qty 1

## 2020-07-05 MED ORDER — DEXAMETHASONE SODIUM PHOSPHATE 10 MG/ML IJ SOLN: 8.0000 mg | Freq: Once | INTRAMUSCULAR | Status: DC

## 2020-07-05 MED ORDER — IRBESARTAN-HYDROCHLOROTHIAZIDE 300-12.5 MG PO TABS: 1.0000 | ORAL_TABLET | Freq: Every day | ORAL | Status: DC

## 2020-07-05 MED ORDER — HYDROMORPHONE HCL 1 MG/ML IJ SOLN
INTRAMUSCULAR | Status: DC | PRN
  Administered 2020-07-05: 1 mg via INTRAVENOUS

## 2020-07-05 MED ORDER — TRAMADOL HCL 50 MG PO TABS: 50.0000 mg | ORAL_TABLET | Freq: Four times a day (QID) | ORAL | Status: DC | PRN

## 2020-07-05 MED ORDER — INSULIN ASPART 100 UNIT/ML ~~LOC~~ SOLN
6.0000 [IU] | Freq: Every day | SUBCUTANEOUS | Status: DC
  Administered 2020-07-06 – 2020-07-07 (×2): 6 [IU] via SUBCUTANEOUS

## 2020-07-05 MED ORDER — METHOCARBAMOL 500 MG PO TABS
500.0000 mg | ORAL_TABLET | Freq: Four times a day (QID) | ORAL | Status: DC | PRN
  Administered 2020-07-05 – 2020-07-07 (×3): 500 mg via ORAL
  Filled 2020-07-05 (×3): qty 1

## 2020-07-05 MED ORDER — LEVOTHYROXINE SODIUM 50 MCG PO TABS
50.0000 ug | ORAL_TABLET | Freq: Every day | ORAL | Status: DC
  Administered 2020-07-06 – 2020-07-07 (×2): 50 ug via ORAL
  Filled 2020-07-05 (×2): qty 1

## 2020-07-05 MED ORDER — FENTANYL CITRATE (PF) 100 MCG/2ML IJ SOLN
INTRAMUSCULAR | Status: AC
  Filled 2020-07-05: qty 2

## 2020-07-05 MED ORDER — MENTHOL 3 MG MT LOZG: 1.0000 | LOZENGE | OROMUCOSAL | Status: DC | PRN

## 2020-07-05 MED ORDER — MORPHINE SULFATE (PF) 2 MG/ML IV SOLN: 0.5000 mg | INTRAVENOUS | Status: DC | PRN

## 2020-07-05 MED ORDER — EPHEDRINE 5 MG/ML INJ
INTRAVENOUS | Status: AC
  Filled 2020-07-05: qty 10

## 2020-07-05 MED ORDER — POLYETHYLENE GLYCOL 3350 17 G PO PACK
17.0000 g | PACK | Freq: Every day | ORAL | Status: DC | PRN
Start: 2020-07-05 — End: 2020-07-07

## 2020-07-05 MED ORDER — INSULIN ASPART 100 UNIT/ML ~~LOC~~ SOLN
0.0000 [IU] | Freq: Every day | SUBCUTANEOUS | Status: DC
  Administered 2020-07-05: 2 [IU] via SUBCUTANEOUS

## 2020-07-05 MED ORDER — METHOCARBAMOL 500 MG IVPB - SIMPLE MED
500.0000 mg | Freq: Four times a day (QID) | INTRAVENOUS | Status: DC | PRN
  Administered 2020-07-05: 500 mg via INTRAVENOUS
  Filled 2020-07-05: qty 50

## 2020-07-05 MED ORDER — SODIUM CHLORIDE (PF) 0.9 % IJ SOLN
INTRAMUSCULAR | Status: DC | PRN
  Administered 2020-07-05: 60 mL

## 2020-07-05 MED ORDER — IRBESARTAN 150 MG PO TABS
300.0000 mg | ORAL_TABLET | Freq: Every day | ORAL | Status: DC
  Administered 2020-07-06 – 2020-07-07 (×2): 300 mg via ORAL
  Filled 2020-07-05 (×2): qty 2

## 2020-07-05 MED ORDER — DAPAGLIFLOZIN PROPANEDIOL 5 MG PO TABS
5.0000 mg | ORAL_TABLET | Freq: Every day | ORAL | Status: DC
  Administered 2020-07-06 – 2020-07-07 (×2): 5 mg via ORAL
  Filled 2020-07-05 (×2): qty 1

## 2020-07-05 MED ORDER — BUPIVACAINE HCL (PF) 0.5 % IJ SOLN
INTRAMUSCULAR | Status: DC | PRN
  Administered 2020-07-05: 20 mL via PERINEURAL

## 2020-07-05 MED ORDER — METOCLOPRAMIDE HCL 5 MG/ML IJ SOLN: 5.0000 mg | Freq: Three times a day (TID) | INTRAMUSCULAR | Status: DC | PRN

## 2020-07-05 MED ORDER — DEXAMETHASONE SODIUM PHOSPHATE 10 MG/ML IJ SOLN
10.0000 mg | Freq: Once | INTRAMUSCULAR | Status: AC
  Administered 2020-07-06: 10 mg via INTRAVENOUS
  Filled 2020-07-05: qty 1

## 2020-07-05 MED ORDER — EPHEDRINE SULFATE 50 MG/ML IJ SOLN
INTRAMUSCULAR | Status: DC | PRN
  Administered 2020-07-05: 10 mg via INTRAVENOUS
  Administered 2020-07-05 (×3): 5 mg via INTRAVENOUS

## 2020-07-05 MED ORDER — ONDANSETRON HCL 4 MG/2ML IJ SOLN: 4.0000 mg | Freq: Once | INTRAMUSCULAR | Status: DC | PRN

## 2020-07-05 MED ORDER — DIPHENHYDRAMINE HCL 12.5 MG/5ML PO ELIX
12.5000 mg | ORAL_SOLUTION | ORAL | Status: DC | PRN
Start: 2020-07-05 — End: 2020-07-07

## 2020-07-05 MED ORDER — DEXAMETHASONE SODIUM PHOSPHATE 4 MG/ML IJ SOLN
INTRAMUSCULAR | Status: DC | PRN
  Administered 2020-07-05: 4 mg via INTRAVENOUS

## 2020-07-05 MED ORDER — PROPOFOL 10 MG/ML IV BOLUS
INTRAVENOUS | Status: AC
  Filled 2020-07-05: qty 20

## 2020-07-05 MED ORDER — INSULIN ASPART 100 UNIT/ML ~~LOC~~ SOLN
0.0000 [IU] | Freq: Three times a day (TID) | SUBCUTANEOUS | Status: DC
  Administered 2020-07-06: 3 [IU] via SUBCUTANEOUS
  Administered 2020-07-06 (×2): 5 [IU] via SUBCUTANEOUS
  Administered 2020-07-07: 2 [IU] via SUBCUTANEOUS

## 2020-07-05 MED ORDER — CHLORHEXIDINE GLUCONATE 0.12 % MT SOLN
15.0000 mL | Freq: Once | OROMUCOSAL | Status: AC
  Administered 2020-07-05: 15 mL via OROMUCOSAL

## 2020-07-05 MED ORDER — GABAPENTIN 300 MG PO CAPS
600.0000 mg | ORAL_CAPSULE | Freq: Three times a day (TID) | ORAL | Status: DC
  Administered 2020-07-05 – 2020-07-07 (×5): 600 mg via ORAL
  Filled 2020-07-05 (×5): qty 2

## 2020-07-05 SURGICAL SUPPLY — 73 items
ADAPTER BOLT FEMORAL +2/-2 (Knees) ×1 IMPLANT
ADPR FEM +2/-2 OFST BOLT (Knees) ×1 IMPLANT
ADPR FEM 5D STRL KN PFC SGM (Orthopedic Implant) ×1 IMPLANT
AUG TIB SZ3 10 REV STP WDG (Knees) ×1 IMPLANT
AUG TIB SZ3 5 REV STP WDG STRL (Knees) ×1 IMPLANT
BAG DECANTER FOR FLEXI CONT (MISCELLANEOUS) ×2 IMPLANT
BAG SPEC THK2 15X12 ZIP CLS (MISCELLANEOUS)
BAG ZIPLOCK 12X15 (MISCELLANEOUS) IMPLANT
BLADE SAG 18X100X1.27 (BLADE) ×2 IMPLANT
BLADE SAW SGTL 11.0X1.19X90.0M (BLADE) ×2 IMPLANT
BLADE SURG SZ10 CARB STEEL (BLADE) ×4 IMPLANT
BNDG ELASTIC 6X5.8 VLCR STR LF (GAUZE/BANDAGES/DRESSINGS) ×2 IMPLANT
BONE CEMENT GENTAMICIN (Cement) ×6 IMPLANT
CEMENT BONE GENTAMICIN 40 (Cement) ×3 IMPLANT
CEMENT RESTRICTOR DEPUY SZ 4 (Cement) ×1 IMPLANT
CLOTH BEACON ORANGE TIMEOUT ST (SAFETY) ×2 IMPLANT
COMP FEM CEM RT SZ3 (Orthopedic Implant) ×2 IMPLANT
COMPONENT FEM CEM RT SZ3 (Orthopedic Implant) IMPLANT
COVER SURGICAL LIGHT HANDLE (MISCELLANEOUS) ×2 IMPLANT
COVER WAND RF STERILE (DRAPES) IMPLANT
CUFF TOURN SGL QUICK 34 (TOURNIQUET CUFF) ×2
CUFF TRNQT CYL 34X4.125X (TOURNIQUET CUFF) ×1 IMPLANT
DECANTER SPIKE VIAL GLASS SM (MISCELLANEOUS) IMPLANT
DRAPE U-SHAPE 47X51 STRL (DRAPES) ×2 IMPLANT
DRSG ADAPTIC 3X8 NADH LF (GAUZE/BANDAGES/DRESSINGS) ×1 IMPLANT
DRSG PAD ABDOMINAL 8X10 ST (GAUZE/BANDAGES/DRESSINGS) ×2 IMPLANT
DURAPREP 26ML APPLICATOR (WOUND CARE) ×2 IMPLANT
ELECT REM PT RETURN 15FT ADLT (MISCELLANEOUS) ×2 IMPLANT
EVACUATOR 1/8 PVC DRAIN (DRAIN) ×1 IMPLANT
FEMORAL ADAPTER (Orthopedic Implant) ×1 IMPLANT
GAUZE SPONGE 4X4 12PLY STRL (GAUZE/BANDAGES/DRESSINGS) ×2 IMPLANT
GLOVE SRG 8 PF TXTR STRL LF DI (GLOVE) ×1 IMPLANT
GLOVE SURG ENC MOIS LTX SZ6 (GLOVE) IMPLANT
GLOVE SURG ENC MOIS LTX SZ7 (GLOVE) ×2 IMPLANT
GLOVE SURG ENC MOIS LTX SZ8 (GLOVE) ×2 IMPLANT
GLOVE SURG UNDER POLY LF SZ6.5 (GLOVE) IMPLANT
GLOVE SURG UNDER POLY LF SZ8 (GLOVE) ×2
GLOVE SURG UNDER POLY LF SZ8.5 (GLOVE) IMPLANT
GOWN STRL REUS W/TWL LRG LVL3 (GOWN DISPOSABLE) ×6 IMPLANT
HANDPIECE INTERPULSE COAX TIP (DISPOSABLE) ×2
HOLDER FOLEY CATH W/STRAP (MISCELLANEOUS) IMPLANT
IMMOBILIZER KNEE 20 (SOFTGOODS) ×2
IMMOBILIZER KNEE 20 THIGH 36 (SOFTGOODS) ×1 IMPLANT
INSERT TC3 TIBIAL 3.0 (Knees) ×1 IMPLANT
KIT TURNOVER KIT A (KITS) ×2 IMPLANT
MANIFOLD NEPTUNE II (INSTRUMENTS) ×2 IMPLANT
NS IRRIG 1000ML POUR BTL (IV SOLUTION) ×2 IMPLANT
PACK TOTAL KNEE CUSTOM (KITS) ×2 IMPLANT
PADDING CAST COTTON 6X4 STRL (CAST SUPPLIES) ×5 IMPLANT
PENCIL SMOKE EVACUATOR (MISCELLANEOUS) IMPLANT
PROTECTOR NERVE ULNAR (MISCELLANEOUS) ×2 IMPLANT
SET HNDPC FAN SPRY TIP SCT (DISPOSABLE) ×1 IMPLANT
STEM TIBIA PFC 13X30MM (Stem) ×1 IMPLANT
STEM UNIVERSAL REVISION 75X18 (Stem) ×1 IMPLANT
STRIP CLOSURE SKIN 1/2X4 (GAUZE/BANDAGES/DRESSINGS) ×2 IMPLANT
SUT MNCRL AB 4-0 PS2 18 (SUTURE) ×2 IMPLANT
SUT STRATAFIX 0 PDS 27 VIOLET (SUTURE) ×2
SUT VIC AB 2-0 CT1 27 (SUTURE) ×6
SUT VIC AB 2-0 CT1 TAPERPNT 27 (SUTURE) ×3 IMPLANT
SUTURE STRATFX 0 PDS 27 VIOLET (SUTURE) ×1 IMPLANT
SWAB COLLECTION DEVICE MRSA (MISCELLANEOUS) IMPLANT
SWAB CULTURE ESWAB REG 1ML (MISCELLANEOUS) IMPLANT
SYR 50ML LL SCALE MARK (SYRINGE) ×4 IMPLANT
TOWER CARTRIDGE SMART MIX (DISPOSABLE) ×2 IMPLANT
TRAY FOLEY MTR SLVR 16FR STAT (SET/KITS/TRAYS/PACK) ×2 IMPLANT
TRAY REVISION SZ 3 (Knees) ×1 IMPLANT
TRAY SLEEVE CEM ML (Knees) ×1 IMPLANT
TUBE KAMVAC SUCTION (TUBING) IMPLANT
TUBE SUCTION HIGH CAP CLEAR NV (SUCTIONS) ×2 IMPLANT
WATER STERILE IRR 1000ML POUR (IV SOLUTION) ×2 IMPLANT
WEDGE SIZE 3 5MM (Knees) ×1 IMPLANT
WEDGE SZ 3 10MM (Knees) ×1 IMPLANT
WRAP KNEE MAXI GEL POST OP (GAUZE/BANDAGES/DRESSINGS) ×1 IMPLANT

## 2020-07-05 NOTE — Interval H&P Note (Signed)
History and Physical Interval Note:  07/05/2020 11:25 AM  [name]  has presented today for surgery, with the diagnosis of Failed right total knee arthroplasty.  The various methods of treatment have been discussed with the patient and family. After consideration of risks, benefits and other options for treatment, the patient has consented to  Procedure(s) with comments: TOTAL KNEE REVISION (Right) - 178min as a surgical intervention.  The patient's history has been reviewed, patient examined, no change in status, stable for surgery.  I have reviewed the patient's chart and labs.  Questions were answered to the patient's satisfaction.     Pilar Plate Jamari Diana

## 2020-07-05 NOTE — Anesthesia Postprocedure Evaluation (Signed)
Anesthesia Post Note  Patient: [name]  Procedure(s) Performed: TOTAL KNEE REVISION (Right Knee)     Patient location during evaluation: PACU Anesthesia Type: General Level of consciousness: awake and alert Pain management: pain level controlled Vital Signs Assessment: post-procedure vital signs reviewed and stable Respiratory status: spontaneous breathing, nonlabored ventilation, respiratory function stable and patient connected to nasal cannula oxygen Cardiovascular status: blood pressure returned to baseline and stable Postop Assessment: no apparent nausea or vomiting Anesthetic complications: no   No complications documented.  Last Vitals:  Vitals:   07/05/20 1600 07/05/20 1615  BP: 140/89 (!) 157/80  Pulse: 86 90  Resp: 13 12  Temp:    SpO2: 94% 93%    Last Pain:  Vitals:   07/05/20 1615  TempSrc:   PainSc: Asleep                 Inita Uram S

## 2020-07-05 NOTE — Op Note (Signed)
NAMEMAKELLE, MARRONE MEDICAL RECORD NO: [id number] ACCOUNT NO: [id number] DATE OF BIRTH: 11-12-1940 FACILITY: [facility] LOCATION: [facility] PHYSICIAN: Dione Plover. 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  PROCEDURE:  Right total knee arthroplasty revision.  SURGEON:  Dione Plover. Netanya Yazdani, MD  ASSISTANT:  Nadine Counts, PA-C  ANESTHESIA:  General and adductor canal block.  ESTIMATED BLOOD LOSS:  200.  DRAINS:  None.  TOURNIQUET TIME:  Up 49 minutes at 300 mmHg, down 8 minutes, up additional 16 minutes at 300 mmHg.  COMPLICATIONS:  None.  CONDITION:  Stable to recovery.  BRIEF CLINICAL NOTE:  The patient is an 80 year old female who had a right total knee arthroplasty done last year.  She states that she has persistent pain and a significant deformity of her leg since the surgery.  All of this has gotten progressively  worse.  I saw her in second opinion.  She was noted to have significant valgus in the right knee.  She had a workup for infection, which was negative.  I was concerned about loosening of the prosthesis due to a significant tilt in the tibial component.   She presents now for total knee arthroplasty revision.  PROCEDURE IN DETAIL:  After successful administration of adductor canal block and general anesthetic, a tourniquet was placed high on the right thigh, and right lower extremity was prepped and draped in the usual sterile fashion.  Extremity was wrapped  in Esmarch, knee flexed, tourniquet inflated to 300 mmHg.  Midline incision was made with a 10 blade through subcutaneous tissue to the level of the extensor mechanism.  A fresh blade was used to make a medial parapatellar arthrotomy.  Soft tissue over  the proximal medial tibia  subperiosteally elevated in the joint line with a knife and into the semimembranosus bursa with a Cobb elevator.  Soft tissue laterally was elevated with attention being paid to avoid the patellar tendon on the tibial tubercle.   We removed a moderate amount of scar from the medial and lateral gutters and the infrapatellar area.  I was then able to flex the knee 90 degrees.  I removed the tibial polyethylene.  The tray had collapsed into a significant valgus.  I exposed the  proximal portion of the tray and was able to easily remove it.  It was a Stryker tibia and did not have any ingrowth at all.  The tibia was then subluxed forward and circumferential retraction placed.  The extramedullary tibial alignment guide was placed  referencing proximally at the medial aspect of the tibial tubercle and distally along the second metatarsal axis and tibial crest.  The block was pinned to remove about 2 mm from the medial side.  This did not get to the bottom of the defect laterally.   Resection was made.  There was still slight defect laterally, so I cut enough bone laterally to allow for additional 5 mm wedge on that side.  We then reamed the canal of the tibia proximally and then reamed distally for a 13 mm cemented stem.  I then  prepared a 29 mm sleeve proximally and it had great rotational purchase.  We then trialed the tibia. It was a size 3 with a 5 mm augment medially, a 10 mm augment laterally, a 29 sleeve, and 13 x 30 stem  extension.  This had great fit on the tibia.  We then addressed the femur.  The osteotome was used to disrupt the interface between the femoral component and bone.  The component was easily removed as there was no ingrowth of bone at all.  There were no bony defects either.  We accessed the femoral  canal and thoroughly irrigated it.  I reamed up to 18 mm, which had a great press-fit.  Reamer was left in place to serve as an intramedullary cutting guide.  Distal femoral cutting block was  placed and minimal resection was necessary.  It was cut in 5  degrees of valgus.  The size 3 was the most appropriate size for the femoral component.  The size 3 cutting block was placed with the rotation corresponding with the epicondylar axis and also in that same rotation the spacer block was placed and the  rotation was marked and the block pinned.  There was minimal bone taken in anterior, posterior, chamfer.  We then placed a block for intercondylar cut for the TC3 component and that was made.  The trial femur was then placed, which was a size 3 femur  with a 5 degree right valgus alignment with an 18 x 75 stem extension.  The femoral tibial trials were placed and then a 12.5 insert placed and that allowed for full extension with excellent varus and valgus stability throughout full range of motion.   Patella tracked normally.  The tourniquet was then released, total time of 49 minutes.  Let down for 8 minutes while the components were assembled on the back table.  After 8 minutes, we wrapped the leg with an Esmarch and reinflated to 300 mmHg.  Once the components were  assembled, then trials were removed and the cut bone surfaces prepared with pulsatile lavage.  Three batches of gentamicin-impregnated cement were mixed, and once the cement was ready, I worked on the tibia side first.  The tibial cement restrictor had  been placed earlier in the case and was size 4.  The canals were thoroughly irrigated with saline solution with pulsatile lavage.  Cement was injected into the tibia first, and the tibial component impacted and all extruded cement removed.  On the  femoral side, it was a press-fit stem and it was cemented distally.  This was similarly impacted and extruded cement removed.  A 12.5 mm trial insert was placed, knee held in full extension, and any additional cement was removed.  The patella did not  need to be redone as the patellar component was well fixed and was not worn at all.  Once  the cement was fully hardened, then the permanent 12.5 mm TC3 rotating platform insert was placed into the tibial tray.  The wound was copiously irrigated with  saline solution.  20 mL of Exparel mixed with 60 mL of saline was injected into the extensor mechanism, periosteum of the femur, medial and lateral gutters, and subcu.  We then let the tourniquet down for a second time of 16 minutes.  Subcu was closed  with interrupted 2-0 Vicryl and skin with staples.  Incision was cleaned and dried and Steri-Strips and a bulky sterile dressing applied.  She was then placed into a knee immobilizer, awakened, and transported to recovery in stable condition.  Note that a surgical assistant was of medical necessity for this procedure to assist me and get it done in expeditious manner.  Necessary for protection of the vital neurovascular structures and positioning  the limb for safe removal of the old implants  and safe and accurate placement of the new implants.   ROH D: 07/05/2020 2:41:45 pm T: 07/05/2020 4:35:00 pm  JOB: [id number]/ [id number]

## 2020-07-05 NOTE — Anesthesia Procedure Notes (Signed)
Anesthesia Procedure Image    

## 2020-07-05 NOTE — Progress Notes (Signed)
Orthopedic Tech Progress Note Patient Details:  [name] Jul 13, 1940 [id number]  CPM Right Knee CPM Right Knee: On Right Knee Flexion (Degrees): 40 Right Knee Extension (Degrees): 10  Post Interventions Patient Tolerated: Well Instructions Provided: Care of device  [name] 07/05/2020, 5:19 PM

## 2020-07-05 NOTE — Anesthesia Preprocedure Evaluation (Signed)
Anesthesia Evaluation  Patient identified by MRN, date of birth, ID band Patient awake    Reviewed: Allergy & Precautions, NPO status , Patient's Chart, lab work & pertinent test results  Airway Mallampati: II  TM Distance: >3 FB Neck ROM: Full    Dental no notable dental hx.    Pulmonary neg pulmonary ROS, former smoker,    Pulmonary exam normal breath sounds clear to auscultation       Cardiovascular hypertension, Pt. on medications Normal cardiovascular exam Rhythm:Regular Rate:Normal     Neuro/Psych negative neurological ROS  negative psych ROS   GI/Hepatic Neg liver ROS, GERD  ,  Endo/Other  diabetesHypothyroidism   Renal/GU negative Renal ROS  negative genitourinary   Musculoskeletal negative musculoskeletal ROS (+)   Abdominal   Peds negative pediatric ROS (+)  Hematology negative hematology ROS (+)   Anesthesia Other Findings   Reproductive/Obstetrics negative OB ROS                             Anesthesia Physical Anesthesia Plan  ASA: II  Anesthesia Plan: General   Post-op Pain Management:    Induction: Intravenous  PONV Risk Score and Plan: 3 and Ondansetron, Dexamethasone and Treatment may vary due to age or medical condition  Airway Management Planned: LMA  Additional Equipment:   Intra-op Plan:   Post-operative Plan: Extubation in OR  Informed Consent: I have reviewed the patients History and Physical, chart, labs and discussed the procedure including the risks, benefits and alternatives for the proposed anesthesia with the patient or authorized representative who has indicated his/her understanding and acceptance.     Dental advisory given  Plan Discussed with: CRNA and Surgeon  Anesthesia Plan Comments:         Anesthesia Quick Evaluation

## 2020-07-05 NOTE — Transfer of Care (Signed)
Immediate Anesthesia Transfer of Care Note  Patient: [name]  Procedure(s) Performed: TOTAL KNEE REVISION (Right Knee)  Patient Location: PACU  Anesthesia Type:General  Level of Consciousness: sedated, patient cooperative and responds to stimulation  Airway & Oxygen Therapy: Patient Spontanous Breathing and Patient connected to face mask oxygen  Post-op Assessment: Report given to RN and Post -op Vital signs reviewed and stable  Post vital signs: Reviewed and stable  Last Vitals:  Vitals Value Taken Time  BP    Temp    Pulse    Resp    SpO2      Last Pain:  Vitals:   07/05/20 1140  TempSrc:   PainSc: 0-No pain      Patients Stated Pain Goal: 3 (37/34/28 7681)  Complications: No complications documented.

## 2020-07-05 NOTE — Anesthesia Procedure Notes (Signed)
Anesthesia Regional Block: Adductor canal block   Pre-Anesthetic Checklist: ,, timeout performed, Correct Patient, Correct Site, Correct Laterality, Correct Procedure, Correct Position, site marked, Risks and benefits discussed,  Surgical consent,  Pre-op evaluation,  At surgeon's request and post-op pain management  Laterality: Right  Prep: chloraprep       Needles:  Injection technique: Single-shot  Needle Type: Echogenic Needle     Needle Length: 9cm      Additional Needles:   Procedures:,,,, ultrasound used (permanent image in chart),,,,  Narrative:  Start time: 07/05/2020 12:30 PM End time: 07/05/2020 12:37 PM Injection made incrementally with aspirations every 5 mL.  Performed by: Personally  Anesthesiologist: Myrtie Soman, MD  Additional Notes: Patient tolerated the procedure well without complications

## 2020-07-05 NOTE — Anesthesia Procedure Notes (Signed)
Procedure Name: LMA Insertion Date/Time: 07/05/2020 1:37 PM Performed by: Lavina Hamman, CRNA Pre-anesthesia Checklist: Patient identified, Emergency Drugs available, Suction available and Patient being monitored Patient Re-evaluated:Patient Re-evaluated prior to induction Oxygen Delivery Method: Circle System Utilized Preoxygenation: Pre-oxygenation with 100% oxygen Induction Type: IV induction Ventilation: Mask ventilation without difficulty LMA: LMA inserted LMA Size: 4.0 Number of attempts: 1 Airway Equipment and Method: Bite block Placement Confirmation: positive ETCO2 Tube secured with: Tape Dental Injury: Teeth and Oropharynx as per pre-operative assessment

## 2020-07-05 NOTE — Brief Op Note (Signed)
07/05/2020  2:32 PM  PATIENT:  [name]  80 y.o. female  PRE-OPERATIVE DIAGNOSIS:  Failed right total knee arthroplasty  POST-OPERATIVE DIAGNOSIS:  failed  right knee arthroplasty  PROCEDURE:  Procedure(s) with comments: TOTAL KNEE REVISION (Right) - 193min  SURGEON:  Surgeon(s) and Role:    Gaynelle Arabian, MD - Primary  PHYSICIAN ASSISTANT:   ASSISTANTS: Kristie Edmisten PA-C   ANESTHESIA:   regional and general  EBL:  200 mL   BLOOD ADMINISTERED:none  DRAINS: none   LOCAL MEDICATIONS USED:  OTHER Exparel  COUNTS:  YES  TOURNIQUET:   Total Tourniquet Time Documented: Thigh (Right) - 50 minutes Thigh (Right) - 21 minutes Total: Thigh (Right) - 71 minutes   DICTATION: .Other Dictation: Dictation Number [id number]  PLAN OF CARE: Admit to inpatient   PATIENT DISPOSITION:  PACU - hemodynamically stable.

## 2020-07-05 NOTE — Progress Notes (Signed)
Assisted Dr.George Rose with  Right Knee Adductor Canal block. Side rails up, monitors on throughout procedure. See vital signs in flow sheet. Tolerated Procedure well.  

## 2020-07-05 NOTE — H&P (Signed)
TOTAL KNEE REVISION ADMISSION H&P  Patient is being admitted for right revision total knee arthroplasty.  Subjective:  Chief Complaint:right knee pain, failed total knee  HPI: [name], 80 y.o. female, has a history of pain and functional disability in the right knee(s) due to failed previous arthroplasty and patient has failed non-surgical conservative treatments for greater than 12 weeks to include NSAID's and/or analgesics and activity modification.  Her primary knee replacement was done in April 2021 by Dr. Ninfa Linden. She was seen in our clinic, and found to have valgus malalignment due to a tibial component malalignment. The patient has had symptoms in the right knee including pain and instability which has impacted their quality of life and ability to do activities of daily living. The patient currently has a diagnosis of right failed right total knee arthroplasty and has failed conservative treatments including activity modification, assistive devices. Dr. Wynelle Link discussed with her that the only way to correct this is with a total knee revision to correct the alignment and stability. We did discuss that in detail today including the procedure risks, potential complications, and rehabilitation course.There is no current active infection.  Patient Active Problem List   Diagnosis Date Noted  . Diverticular disease of colon 05/18/2020  . Gastroesophageal reflux disease 05/18/2020  . Irritable bowel syndrome with diarrhea 05/18/2020  . Status post total right knee replacement 08/13/2019  . Unilateral primary osteoarthritis, right knee 05/24/2019  . Fracture, proximal femur ([place]) 06/12/2018  . Femur fracture, left ([place]) 06/12/2018  . Acute blood loss as cause of postoperative anemia 03/30/2017  . Diabetic polyneuropathy associated with secondary diabetes mellitus ([place]) 03/30/2017  . Asthma 03/30/2017  . Dyslipidemia 03/26/2017  . Lumbar stenosis with neurogenic claudication 03/17/2017   . Spinal stenosis of lumbar region 03/03/2017  . Myoclonic jerking   . Nausea   . Acute encephalopathy 07/15/2016  . Lumbar radiculopathy 06/27/2016  . Hypothyroidism 06/26/2016  . Labile blood glucose   . Surgery, elective   . Post-operative pain   . Sickle cell trait ([place])   . Acute blood loss anemia   . History of back surgery   . AKI (acute kidney injury) ([place])   . Herniated nucleus pulposus, L2-3 06/25/2016  . Bilateral primary osteoarthritis of knee 03/26/2016  . Osteoarthritis, hand 03/26/2016  . Other insomnia 03/26/2016  . Fibromyalgia 09/27/2014  . Nocturnal leg cramps 09/27/2014  . Body mass index (BMI) of 30.0-30.9 in adult 08/04/2014  . Neuropathy 03/10/2014  . Allergic rhinitis 03/10/2014  . Osteoporosis 03/10/2014  . Spinal stenosis of lumbar region with radiculopathy 02/28/2014  . Type 2 diabetes mellitus with complication, with long-term current use of insulin ([place]) 07/08/2013  . Hypokalemia 11/02/2012  . Essential hypertension, benign 11/02/2012  . Diabetes mellitus with neuropathy ([place]) 10/29/2012  . Hyperlipidemia 10/29/2012  . Spondylolisthesis of lumbar region 03/19/2011  . Lumbar radicular pain 03/19/2011   Past Medical History:  Diagnosis Date  . Acute blood loss anemia   . Acute encephalopathy 07/15/2016  . AKI (acute kidney injury) ([place])   . Anemia    has sickle cell trait  . Anxiety   . Arthritis   . Asthma    has used inhaler in past for asthmatic bronchitis, last time- early 2012  . Bilateral primary osteoarthritis of knee 03/26/2016  . Complication of anesthesia    wakes up shaking  . Diabetes mellitus   . Diabetes mellitus with neuropathy ([place]) 10/29/2012  . Diverticulitis   . Dyslipidemia   .  Dyspnea    with exertion  . Encephalopathy 06/2016   due to medications after surgery  . Fibromyalgia   . GERD (gastroesophageal reflux disease)    occas. use of  Prilosec  . Heart murmur    sees Dr. Montez Morita, last seen- early 2012  .  Herniated nucleus pulposus, L2-3 06/25/2016  . History of back surgery   . Hypertension    02/2010- stress test /w PCP  . Hypothyroidism   . Loose bowel movements 12/2016  . Lumbar radiculopathy 06/27/2016  . Lumbar stenosis with neurogenic claudication 03/17/2017  . Memory disorder 02/16/2016  . Myoclonic jerking   . Neuromuscular disorder (H[facility])    lumbar radiculopathy, lumbago  . Nocturnal leg cramps 09/27/2014  . Osteoporosis 03/10/2014  . Pneumonia   . Sickle cell trait ([place])   . Sleep apnea    borderline sleep apnea, states she no longer uses, early 2012- stopped using   . Spinal stenosis of lumbar region with radiculopathy 02/28/2014  . Spondylolisthesis of lumbar region 03/19/2011  . Type II or unspecified type diabetes mellitus without mention of complication, uncontrolled 07/08/2013    Past Surgical History:  Procedure Laterality Date  . ABDOMINAL HYSTERECTOMY    . adb.cyst     ovarian cyst  . BACK SURGERY     2012, 2015 (3 total)  . BACK SURGERY  2018   02/24/2017  . COLONOSCOPY    . EYE SURGERY     macular degeneration treatment - injections  . FEMUR IM NAIL Left 06/13/2018   Procedure: INTRAMEDULLARY (IM) NAIL FEMORAL;  Surgeon: [name], MD;  Location: [facility];  Service: Orthopedics;  Laterality: Left;  . OVARIAN CYST SURGERY    . POSTERIOR LUMBAR FUSION 4 LEVEL N/A 03/17/2017   Procedure: Decompression of Lumbar One-Two with Thoracic Ten to Lumbar Two Fusion;  Surgeon: Kristeen Miss, MD;  Location: [facility];  Service: Neurosurgery;  Laterality: N/A;  Decompression of L1-2 with T10 to L2 Fusion  . TOTAL KNEE ARTHROPLASTY Right 08/13/2019   Procedure: RIGHT TOTAL KNEE ARTHROPLASTY;  Surgeon: [name], MD;  Location: [facility];  Service: Orthopedics;  Laterality: Right;    Current Facility-Administered Medications  Medication Dose Route Frequency Provider Last Rate Last Admin  . bupivacaine liposome (EXPAREL) 1.3 % injection 266 mg  20 mL Other On  Call to OR Polly Cobia, [facility]       Current Outpatient Medications  Medication Sig Dispense Refill Last Dose  . acetaminophen (TYLENOL) 500 MG tablet Take 1,000 mg by mouth every 8 (eight) hours as needed for mild pain or moderate pain.      Marland Kitchen BYSTOLIC 5 MG tablet Take 5 mg by mouth at bedtime. Nebivolol     . cetirizine (ZYRTEC) 10 MG tablet Take 1 tablet (10 mg total) by mouth daily as needed for allergies. (Patient taking differently: Take 10 mg by mouth 2 (two) times daily.) 30 tablet 0   . dapagliflozin propanediol (FARXIGA) 5 MG TABS tablet TAKE 1 TABLET EVERY DAY (Patient taking differently: Take 5 mg by mouth daily.) 90 tablet 2   . Evolocumab with Infusor (Vincent) 420 MG/3.5ML SOCT Inject 420 mg into the skin every 30 (thirty) days. 3.6 mL 3   . fluticasone (FLONASE) 50 MCG/ACT nasal spray Place 1 spray into both nostrils daily as needed for allergies. (Patient taking differently: Place 2 sprays into both nostrils daily.) 0.003 g 0   . gabapentin (NEURONTIN) 600 MG tablet TAKE 1  TABLET(600 MG) BY MOUTH THREE TIMES DAILY (Patient taking differently: Take 600 mg by mouth 3 (three) times daily.) 270 tablet 1   . HYDROcodone-acetaminophen (NORCO/VICODIN) 5-325 MG tablet Take 1 tablet by mouth every 6 (six) hours as needed for moderate pain. 30 tablet 0   . ibuprofen (ADVIL) 800 MG tablet Take 1 tablet (800 mg total) by mouth every 8 (eight) hours as needed. (Patient taking differently: Take 800 mg by mouth every 8 (eight) hours as needed for moderate pain.) 90 tablet 3   . insulin aspart (NOVOLOG FLEXPEN) 100 UNIT/ML FlexPen INJECT 6 UNITS BEFORE BREAKFAST, 9 UNITS AT LUNCH, 8-10 UNITS AT DINNER, 3-4 UNITS AS NEEDED FOR LARGE CARBS OR ICE CREAM (Patient taking differently: Inject 3-10 Units into the skin See admin instructions. INJECT 6 UNITS BEFORE BREAKFAST, 10 UNITS before  LUNCH, 9 UNITS before DINNER, 3-4 UNITS AS NEEDED FOR LARGE CARBS OR ICE CREAM) 30 mL 1   .  irbesartan-hydrochlorothiazide (AVALIDE) 300-12.5 MG tablet Take 1 tablet by mouth daily.     Marland Kitchen ketoconazole (NIZORAL) 2 % cream Apply 1 application topically daily as needed (Itching).     Marland Kitchen levothyroxine (SYNTHROID) 50 MCG tablet TAKE 1 TABLET(50 MCG) BY MOUTH DAILY (Patient taking differently: Take 50 mcg by mouth daily before breakfast.) 90 tablet 1   . meloxicam (MOBIC) 15 MG tablet Take 15 mg by mouth daily.     . Multiple Minerals-Vitamins (CAL-MAG-ZINC-D) TABS Take 1 tablet by mouth daily. 330 mg /130 mg/ 5 mg /5 mg     . SYMBICORT 80-4.5 MCG/ACT inhaler Inhale 2 puffs into the lungs 2 (two) times daily as needed (shortness of breath). (Patient taking differently: Inhale 2 puffs into the lungs 2 (two) times daily.) 1 Inhaler 0   . ACCU-CHEK AVIVA PLUS test strip TEST BLOOD SUGAR FOUR TIMES DAILY AS DIRECTED 400 strip 3   . aspirin 81 MG chewable tablet Chew 1 tablet (81 mg total) by mouth 2 (two) times daily. (Patient not taking: No sig reported) 60 tablet 0 Not Taking at Unknown time  . Blood Glucose Monitoring Suppl (ACCU-CHEK AVIVA PLUS) w/Device KIT Use Accu Chek Aviva as instructed to check blood sugar 4 times daily. 1 kit 0   . insulin degludec (TRESIBA FLEXTOUCH) 100 UNIT/ML FlexTouch Pen Inject 24 Units into the skin daily. 30 mL 3   . Insulin Pen Needle (BD PEN NEEDLE NANO U/F) 32G X 4 MM MISC Use to inject insulin 4 times daily. 400 each 2    Allergies  Allergen Reactions  . Cymbalta [Duloxetine Hcl] Diarrhea and Other (See Comments)    Dizziness, headache, irritability  . Amlodipine Besylate Swelling  . Baclofen     jerks   . Betadine [Povidone Iodine] Swelling and Other (See Comments)    SWELLING REACTION DESCRIPTION/SEVERITY UNSPECIFIED  Reaction to betadine on eye lid and it swelled up  . Erythromycin Base Other (See Comments)    unknown  . Oxycodone Hcl   . Percocet [Oxycodone-Acetaminophen] Other (See Comments)    Confusion change personality  . Povidone-Iodine  Swelling  . Soap Other (See Comments)    unknown  . Statins Other (See Comments)    Muscle cramps  . Adhesive [Tape] Rash  . Lipitor [Atorvastatin] Rash    Social History   Tobacco Use  . Smoking status: Former Smoker    Packs/day: 0.10    Years: 30.00    Pack years: 3.00    Types: Cigarettes    Quit date:  2005    Years since quitting: 17.2  . Smokeless tobacco: Never Used  Substance Use Topics  . Alcohol use: Yes    Comment: wine /w dinner on occas.    Family History  Problem Relation Age of Onset  . Ovarian cancer Mother   . Cancer - Prostate Father   . Breast cancer Paternal Aunt   . Multiple myeloma Paternal Aunt   . Anesthesia problems Neg Hx   . Hypotension Neg Hx   . Malignant hyperthermia Neg Hx   . Pseudochol deficiency Neg Hx       Review of Systems  Constitutional: Negative for chills and fever.  Respiratory: Negative for cough and shortness of breath.   Cardiovascular: Negative for chest pain.  Gastrointestinal: Negative for nausea and vomiting.  Musculoskeletal: Positive for arthralgias.     Objective:  Physical Exam Patient is an 80 year old female.  Well nourished and well developed. General: Alert and oriented x3, cooperative and pleasant, no acute distress. Head: normocephalic, atraumatic, neck supple. Eyes: EOMI. Respiratory: breath sounds clear in all fields, no wheezing, rales, or rhonchi.  Musculoskeletal:  Right Knee Exam: No effusion present. No swelling present. Significant valgus deformity of approximately 15 degrees while standing. It is exacerbated even further. The range of motion is: 0 to 115 degrees. No crepitus on range of motion of the knee. No medial joint line tenderness. No lateral joint line tenderness. She has laxity with varus stressing. I can not get her fully back to neutral, but can get her close. There is no AP laxity, but she does have pain on attempted AP motion.  Calves soft and nontender. Motor function  intact in LE. Strength 5/5 LE bilaterally. Neuro: Distal pulses 2+. Sensation to light touch intact in LE. Vital signs in last 24 hours:    Labs:  Estimated body mass index is 32.06 kg/m as calculated from the following:   Height as of 06/22/20: '5\' 3"'  (1.6 m).   Weight as of 06/22/20: 82.1 kg.  Imaging Review X-rays from 09/2019, 10/2019, and 11/2019 taken at Dr. Trevor Mace office reveal that she has a press fit total knee arthroplasty. The tibial component is in valgus and her knee is in approximately 12 to 15 degrees of valgus alignment. To me, it looks like the tibial component potentially collapsed a tiny bit into more valgus from June until October. There is cortical disruption on the medial condylar flare of the femur, but it has not changed at all in appearance over time.   Assessment/Plan:  History of total knee arthroplasty, right knee(s) with failed previous arthroplasty.   The patient history, physical examination, clinical judgment of the provider and imaging studies are consistent with failed total knee replacement of the right knee(s), previous total knee arthroplasty. Revision total knee arthroplasty is deemed medically necessary. The treatment options including medical management, injection therapy, arthroscopy and revision arthroplasty were discussed at length. The risks and benefits of revision total knee arthroplasty were presented and reviewed. The risks due to aseptic loosening, infection, stiffness, patella tracking problems, thromboembolic complications and other imponderables were discussed. The patient acknowledged the explanation, agreed to proceed with the plan and consent was signed. Patient is being admitted for inpatient treatment for surgery, pain control, PT, OT, prophylactic antibiotics, VTE prophylaxis, progressive ambulation and ADL's and discharge planning.The patient is planning to be discharged home.   Therapy Plans: outpatient therapy at [facility] Disposition:  Lives alone in apartment at [place] Planned DVT Prophylaxis: aspirin 361m BID DME  needed: none PCP: Dr. Lysle Rubens, clearance received Endocrinologist: Dr. Dwyane Dee TXA: IV Allergies: Betadine - swelling , erythromycin - unknown Anesthesia Concerns: none BMI: 31.9 Last HgbA1c: 7.4%  Other: - No aquacel, states she had skin reaction with it last time. - Prefers hydrocodone. - Staying overnight - Hx sickle cell trait  - Patient was instructed on what medications to stop prior to surgery. - Follow-up visit in 2 weeks with Dr. Wynelle Link - Begin physical therapy following surgery - Pre-operative lab work as pre-surgical testing - Prescriptions will be provided in hospital at time of discharge  Griffith Citron, PA-C Orthopedic Surgery [facility] [phone]

## 2020-07-06 LAB — CBC
HCT: 35.9 % — ABNORMAL LOW (ref 36.0–46.0)
Hemoglobin: 11.6 g/dL — ABNORMAL LOW (ref 12.0–15.0)
MCH: 28.2 pg (ref 26.0–34.0)
MCHC: 32.3 g/dL (ref 30.0–36.0)
MCV: 87.3 fL (ref 80.0–100.0)
Platelets: 179 10*3/uL (ref 150–400)
RBC: 4.11 MIL/uL (ref 3.87–5.11)
RDW: 14 % (ref 11.5–15.5)
WBC: 9.6 10*3/uL (ref 4.0–10.5)
nRBC: 0 % (ref 0.0–0.2)

## 2020-07-06 LAB — GLUCOSE, CAPILLARY
Glucose-Capillary: 245 mg/dL — ABNORMAL HIGH (ref 70–99)
Glucose-Capillary: 152 mg/dL — ABNORMAL HIGH (ref 70–99)
Glucose-Capillary: 216 mg/dL — ABNORMAL HIGH (ref 70–99)
Glucose-Capillary: 184 mg/dL — ABNORMAL HIGH (ref 70–99)

## 2020-07-06 LAB — BASIC METABOLIC PANEL
Anion gap: 9 (ref 5–15)
BUN: 23 mg/dL (ref 8–23)
CO2: 26 mmol/L (ref 22–32)
Calcium: 8.8 mg/dL — ABNORMAL LOW (ref 8.9–10.3)
Chloride: 105 mmol/L (ref 98–111)
Creatinine, Ser: 1.32 mg/dL — ABNORMAL HIGH (ref 0.44–1.00)
GFR, Estimated: 41 mL/min — ABNORMAL LOW (ref >60)
Glucose, Bld: 211 mg/dL — ABNORMAL HIGH (ref 70–99)
Potassium: 4.1 mmol/L (ref 3.5–5.1)
Sodium: 140 mmol/L (ref 135–145)

## 2020-07-06 NOTE — TOC Initial Note (Signed)
Transition of Care [facility]) - Initial/Assessment Note   Patient Details  Name: [name] MRN: [id number] Date of Birth: Mar 01, 1941  Transition of Care [facility]) CM/SW Contact:    Sherie Don, LCSW Phone Number: 07/06/2020, 10:29 AM  Clinical Narrative: Patient is an 80 year old who was admitted for failed total knee arthroplasty. Patient was requesting a new walker as she has had her previous rolling walker for > 5 years. Patient reported she received a rollator July 2021. CSW explained Medicare only covers the cost of walkers every 5 years, which includes her rollator. Patient declined new rolling walker at this time as she does not want to pay out of pocket. CSW confirmed with patient she plans to discharge back to [facility] and will receive OPPT in-house at her facility. TOC to follow.  Expected Discharge Plan: OP Rehab Barriers to Discharge: Continued Medical Work up  Patient Goals and CMS Choice Patient states their goals for this hospitalization and ongoing recovery are:: Discharge home to [facility].gov Compare Post Acute Care list provided to:: Patient Choice offered to / list presented to : Patient  Expected Discharge Plan and Services Expected Discharge Plan: OP Rehab In-house Referral: Clinical Social Work Living arrangements for the past 2 months: [place]              DME Arranged: N/A DME Agency: NA  Prior Living Arrangements/Services Living arrangements for the past 2 months: [place] Lives with:: Facility Resident Patient language and need for interpreter reviewed:: Yes Do you feel safe going back to the place where you live?: Yes      Need for Family Participation in Patient Care: No (Comment) Care giver support system in place?: Yes (comment) Current home services: DME Risk manager, rollator) Criminal Activity/Legal Involvement Pertinent to Current Situation/Hospitalization: No - Comment as needed  Activities of Daily  Living Home Assistive Devices/Equipment: Environmental consultant (specify type),CBG Meter,Cane (specify quad or straight),Eyeglasses ADL Screening (condition at time of admission) Patient's cognitive ability adequate to safely complete daily activities?: Yes Is the patient deaf or have difficulty hearing?: No Does the patient have difficulty seeing, even when wearing glasses/contacts?: No Does the patient have difficulty concentrating, remembering, or making decisions?: No Patient able to express need for assistance with ADLs?: Yes Does the patient have difficulty dressing or bathing?: No Independently performs ADLs?: Yes (appropriate for developmental age) Does the patient have difficulty walking or climbing stairs?: Yes Weakness of Legs: Both Weakness of Arms/Hands: None  Emotional Assessment Appearance:: Appears stated age Attitude/Demeanor/Rapport: Engaged Affect (typically observed): Accepting Orientation: : Oriented to Self,Oriented to Place,Oriented to  Time,Oriented to Situation Alcohol / Substance Use: Not Applicable Psych Involvement: No (comment)  Admission diagnosis:  Failed total right knee replacement [facility]) [T84.012A] Patient Active Problem List   Diagnosis Date Noted  . Failed total knee arthroplasty ([place]) 07/05/2020  . Failed total right knee replacement ([place]) 07/05/2020  . Diverticular disease of colon 05/18/2020  . Gastroesophageal reflux disease 05/18/2020  . Irritable bowel syndrome with diarrhea 05/18/2020  . Status post total right knee replacement 08/13/2019  . Unilateral primary osteoarthritis, right knee 05/24/2019  . Fracture, proximal femur ([place]) 06/12/2018  . Femur fracture, left ([place]) 06/12/2018  . Acute blood loss as cause of postoperative anemia 03/30/2017  . Diabetic polyneuropathy associated with secondary diabetes mellitus ([place]) 03/30/2017  . Asthma 03/30/2017  . Dyslipidemia 03/26/2017  . Lumbar stenosis with neurogenic claudication 03/17/2017  . Spinal  stenosis of lumbar region 03/03/2017  .  Myoclonic jerking   . Nausea   . Acute encephalopathy 07/15/2016  . Lumbar radiculopathy 06/27/2016  . Hypothyroidism 06/26/2016  . Labile blood glucose   . Surgery, elective   . Post-operative pain   . Sickle cell trait ([place])   . Acute blood loss anemia   . History of back surgery   . AKI (acute kidney injury) ([place])   . Herniated nucleus pulposus, L2-3 06/25/2016  . Bilateral primary osteoarthritis of knee 03/26/2016  . Osteoarthritis, hand 03/26/2016  . Other insomnia 03/26/2016  . Fibromyalgia 09/27/2014  . Nocturnal leg cramps 09/27/2014  . Body mass index (BMI) of 30.0-30.9 in adult 08/04/2014  . Neuropathy 03/10/2014  . Allergic rhinitis 03/10/2014  . Osteoporosis 03/10/2014  . Spinal stenosis of lumbar region with radiculopathy 02/28/2014  . Type 2 diabetes mellitus with complication, with long-term current use of insulin ([place]) 07/08/2013  . Hypokalemia 11/02/2012  . Essential hypertension, benign 11/02/2012  . Diabetes mellitus with neuropathy ([place]) 10/29/2012  . Hyperlipidemia 10/29/2012  . Spondylolisthesis of lumbar region 03/19/2011  . Lumbar radicular pain 03/19/2011   PCP:  Wenda Low, MD Pharmacy:   [facility], [place] [address] Phone: [phone] Fax: [phone]  [id number] [place], [address] [place] [address] Phone: [phone] Fax: [phone]  Readmission Risk Interventions No flowsheet data found.

## 2020-07-06 NOTE — Plan of Care (Signed)
°  Problem: Education: °Goal: Knowledge of the prescribed therapeutic regimen will improve °Outcome: Progressing °  °Problem: Clinical Measurements: °Goal: Postoperative complications will be avoided or minimized °Outcome: Progressing °  °Problem: Pain Management: °Goal: Pain level will decrease with appropriate interventions °Outcome: Progressing °  °

## 2020-07-06 NOTE — Evaluation (Signed)
Physical Therapy Evaluation Patient Details Name: [name] MRN: [id number] DOB: 09-10-1940 Today's Date: 07/06/2020   History of Present Illness  Pt s/p R TKR revision and with hx of spinal stenosis, Fibromyalgia, and DM with neuropathy  Clinical Impression  Pt s/p R TKR revision and presents with decreased R LE strength/ROM and post op pain limiting functional mobility.  Pt hopes to progress to dc home with intermittent assist of family/friends.    Follow Up Recommendations Follow surgeon's recommendation for DC plan and follow-up therapies    Equipment Recommendations  Rolling walker with 5" wheels    Recommendations for Other Services       Precautions / Restrictions Precautions Precautions: Fall Restrictions Weight Bearing Restrictions: No RLE Weight Bearing: Weight bearing as tolerated      Mobility  Bed Mobility Overal bed mobility: Needs Assistance Bed Mobility: Supine to Sit     Supine to sit: Min assist     General bed mobility comments: pt utilizing bed rails and L LE to assist R LE    Transfers Overall transfer level: Needs assistance Equipment used: Rolling walker (2 wheeled) Transfers: Sit to/from Stand Sit to Stand: Min assist         General transfer comment: cues for LE management and use of UEs to self assist  Ambulation/Gait Ambulation/Gait assistance: Min assist Gait Distance (Feet): 40 Feet Assistive device: Rolling walker (2 wheeled) Gait Pattern/deviations: Step-to pattern;Decreased step length - right;Decreased step length - left;Decreased stance time - right;Shuffle;Trunk flexed Gait velocity: decr   General Gait Details: cues for sequence, posture and position from RW; distance pain limited  Stairs            Wheelchair Mobility    Modified Rankin (Stroke Patients Only)       Balance Overall balance assessment: Needs assistance Sitting-balance support: No upper extremity supported;Feet supported Sitting  balance-Leahy Scale: Good     Standing balance support: Bilateral upper extremity supported Standing balance-Leahy Scale: Poor                               Pertinent Vitals/Pain Pain Assessment: 0-10 Pain Score: 7  Pain Location: R knee Pain Descriptors / Indicators: Aching;Sore;Grimacing Pain Intervention(s): Limited activity within patient's tolerance;Monitored during session;Premedicated before session;Ice applied    Home Living Family/patient expects to be discharged to:: Private residence Living Arrangements: Alone Available Help at Discharge: Friend(s) Type of Home: Apartment Home Access: Level entry;Elevator     Home Layout: One level Home Equipment: Cane - single point;Walker - 4 wheels      Prior Function Level of Independence: Independent with assistive device(s)         Comments: pt is retired from education, has her Ph.D. in Education and used to be VP at [employer]. She h     Hand Dominance   Dominant Hand: Right    Extremity/Trunk Assessment   Upper Extremity Assessment Upper Extremity Assessment: Overall WFL for tasks assessed    Lower Extremity Assessment Lower Extremity Assessment: RLE deficits/detail RLE Deficits / Details: 2/5 quads with AAROM at R knee to 70 flex    Cervical / Trunk Assessment Cervical / Trunk Assessment: Normal  Communication   Communication: No difficulties  Cognition Arousal/Alertness: Awake/alert Behavior During Therapy: WFL for tasks assessed/performed Overall Cognitive Status: Within Functional Limits for tasks assessed  General Comments      Exercises Total Joint Exercises Ankle Circles/Pumps: AROM;Both;15 reps;Supine Quad Sets: AROM;Both;10 reps;Supine Heel Slides: AAROM;Right;10 reps;Supine Straight Leg Raises: AAROM;Right;10 reps;Supine   Assessment/Plan    PT Assessment Patient needs continued PT services  PT Problem List  Decreased strength;Decreased range of motion;Decreased activity tolerance;Decreased balance;Decreased mobility;Decreased knowledge of use of DME;Pain       PT Treatment Interventions DME instruction;Gait training;Stair training;Functional mobility training;Therapeutic activities;Therapeutic exercise;Patient/family education    PT Goals (Current goals can be found in the Care Plan section)  Acute Rehab PT Goals Patient Stated Goal: Regain IND PT Goal Formulation: With patient Time For Goal Achievement: 07/13/20 Potential to Achieve Goals: Good    Frequency 7X/week   Barriers to discharge        Co-evaluation               AM-PAC PT "6 Clicks" Mobility  Outcome Measure Help needed turning from your back to your side while in a flat bed without using bedrails?: A Little Help needed moving from lying on your back to sitting on the side of a flat bed without using bedrails?: A Little Help needed moving to and from a bed to a chair (including a wheelchair)?: A Little Help needed standing up from a chair using your arms (e.g., wheelchair or bedside chair)?: A Little Help needed to walk in hospital room?: A Little Help needed climbing 3-5 steps with a railing? : A Lot 6 Click Score: 17    End of Session Equipment Utilized During Treatment: Gait belt;Right knee immobilizer Activity Tolerance: Patient tolerated treatment well;Patient limited by fatigue;Patient limited by pain Patient left: in chair;with call bell/phone within reach;with chair alarm set Nurse Communication: Mobility status PT Visit Diagnosis: Difficulty in walking, not elsewhere classified (R26.2);Pain Pain - Right/Left: Right Pain - part of body: Knee    Time: 0823-0901 PT Time Calculation (min) (ACUTE ONLY): 38 min   Charges:   PT Evaluation $PT Eval Low Complexity: 1 Low PT Treatments $Gait Training: 8-22 mins $Therapeutic Exercise: 8-22 mins        [name] PT Acute Rehabilitation  Services Pager [phone] Office [phone]   Jash Wahlen 07/06/2020, 1:26 PM

## 2020-07-06 NOTE — Progress Notes (Signed)
Physical Therapy Treatment Patient Details Name: [name] MRN: [id number] DOB: Aug 26, 1940 Today's Date: 07/06/2020    History of Present Illness Pt s/p R TKR revision and with hx of spinal stenosis, Fibromyalgia, and DM with neuropathy    PT Comments    Pt continues motivated and progressing well with mobility.  Pt hopeful for dc home tomorrow.  Pt clarifies that she has paid assist for her first night at home and that PT clinic is [facility] floor [facility].   Follow Up Recommendations  Follow surgeon's recommendation for DC plan and follow-up therapies     Equipment Recommendations  Rolling walker with 5" wheels    Recommendations for Other Services       Precautions / Restrictions Precautions Precautions: Fall Restrictions Weight Bearing Restrictions: No RLE Weight Bearing: Weight bearing as tolerated    Mobility  Bed Mobility Overal bed mobility: Needs Assistance Bed Mobility: Supine to Sit     Supine to sit: Min guard     General bed mobility comments: pt utilizing gait belt to assist R LE and utilizing bed rail    Transfers Overall transfer level: Needs assistance Equipment used: Rolling walker (2 wheeled) Transfers: Sit to/from Stand Sit to Stand: Min guard         General transfer comment: cues for LE management and use of UEs to self assist  Ambulation/Gait Ambulation/Gait assistance: Min guard Gait Distance (Feet): 100 Feet Assistive device: Rolling walker (2 wheeled) Gait Pattern/deviations: Step-to pattern;Decreased step length - right;Decreased step length - left;Decreased stance time - right;Shuffle;Trunk flexed Gait velocity: decr   General Gait Details: cues for sequence, posture and position from RW; distance pain limited   Stairs             Wheelchair Mobility    Modified Rankin (Stroke Patients Only)       Balance Overall balance assessment: Needs assistance Sitting-balance support: No upper  extremity supported;Feet supported Sitting balance-Leahy Scale: Good     Standing balance support: Bilateral upper extremity supported Standing balance-Leahy Scale: Poor                              Cognition Arousal/Alertness: Awake/alert Behavior During Therapy: WFL for tasks assessed/performed Overall Cognitive Status: Within Functional Limits for tasks assessed                                        Exercises Total Joint Exercises Ankle Circles/Pumps: AROM;Both;15 reps;Supine    General Comments        Pertinent Vitals/Pain Pain Assessment: 0-10 Pain Score: 7  Pain Location: R knee Pain Descriptors / Indicators: Aching;Sore;Grimacing Pain Intervention(s): Limited activity within patient's tolerance;Monitored during session;Premedicated before session;Ice applied    Home Living                      Prior Function            PT Goals (current goals can now be found in the care plan section) Acute Rehab PT Goals Patient Stated Goal: Regain IND PT Goal Formulation: With patient Time For Goal Achievement: 07/13/20 Potential to Achieve Goals: Good Progress towards PT goals: Progressing toward goals    Frequency    7X/week      PT Plan Current plan remains appropriate    Co-evaluation  AM-PAC PT "6 Clicks" Mobility   Outcome Measure  Help needed turning from your back to your side while in a flat bed without using bedrails?: A Little Help needed moving from lying on your back to sitting on the side of a flat bed without using bedrails?: A Little Help needed moving to and from a bed to a chair (including a wheelchair)?: A Little Help needed standing up from a chair using your arms (e.g., wheelchair or bedside chair)?: A Little Help needed to walk in hospital room?: A Little Help needed climbing 3-5 steps with a railing? : A Lot 6 Click Score: 17    End of Session Equipment Utilized During Treatment:  Gait belt;Right knee immobilizer Activity Tolerance: Patient tolerated treatment well Patient left: in chair;with call bell/phone within reach;with chair alarm set;with nursing/sitter in room Nurse Communication: Mobility status PT Visit Diagnosis: Difficulty in walking, not elsewhere classified (R26.2);Pain Pain - Right/Left: Right Pain - part of body: Knee     Time: 1335-1415 PT Time Calculation (min) (ACUTE ONLY): 40 min  Charges:  $Gait Training: 23-37 mins                     [place] Pager [phone] Office [phone]    [name] 07/06/2020, 2:24 PM

## 2020-07-06 NOTE — Progress Notes (Signed)
Subjective: 1 Day Post-Op Procedure(s) (LRB): TOTAL KNEE REVISION (Right) Patient reports pain as mild.   Patient seen in rounds by Dr. Wynelle Link. Patient is well, and has had no acute complaints or problems. Denies SOB, chest pain, or calf pain. Foley to be pulled this am.  We will begin physical therapy today.   Objective: Vital signs in last 24 hours: Temp:  [98.1 F (36.7 C)-99.2 F (37.3 C)] 98.2 F (36.8 C) (03/17 0445) Pulse Rate:  [74-90] 86 (03/17 0445) Resp:  [11-24] 16 (03/17 0445) BP: (112-187)/(61-100) 112/61 (03/17 0445) SpO2:  [92 %-100 %] 99 % (03/17 0445) Weight:  [82.1 kg] 82.1 kg (03/16 1140)  Intake/Output from previous day:  Intake/Output Summary (Last 24 hours) at 07/06/2020 0745 Last data filed at 07/06/2020 0600 Gross per 24 hour  Intake 3020 ml  Output 1700 ml  Net 1320 ml     Intake/Output this shift: No intake/output data recorded.  Labs: Recent Labs    07/06/20 0310  HGB 11.6*   Recent Labs    07/06/20 0310  WBC 9.6  RBC 4.11  HCT 35.9*  PLT 179   Recent Labs    07/06/20 0310  NA 140  K 4.1  CL 105  CO2 26  BUN 23  CREATININE 1.32*  GLUCOSE 211*  CALCIUM 8.8*   No results for input(s): LABPT, INR in the last 72 hours.  Exam: General - Patient is Alert and Oriented Extremity - Neurologically intact Neurovascular intact Intact pulses distally Dorsiflexion/Plantar flexion intact Dressing - dressing C/D/I Motor Function - intact, moving foot and toes well on exam.   Past Medical History:  Diagnosis Date  . Acute blood loss anemia   . Acute encephalopathy 07/15/2016  . AKI (acute kidney injury) ([place])   . Anemia    has sickle cell trait  . Anxiety   . Arthritis   . Asthma    has used inhaler in past for asthmatic bronchitis, last time- early 2012  . Bilateral primary osteoarthritis of knee 03/26/2016  . Complication of anesthesia    wakes up shaking  . Diabetes mellitus   . Diabetes mellitus with neuropathy ([place])  10/29/2012  . Diverticulitis   . Dyslipidemia   . Dyspnea    with exertion  . Encephalopathy 06/2016   due to medications after surgery  . Fibromyalgia   . GERD (gastroesophageal reflux disease)    occas. use of  Prilosec  . Heart murmur    sees Dr. Montez Morita, last seen- early 2012  . Herniated nucleus pulposus, L2-3 06/25/2016  . History of back surgery   . Hypertension    02/2010- stress test /w PCP  . Hypothyroidism   . Loose bowel movements 12/2016  . Lumbar radiculopathy 06/27/2016  . Lumbar stenosis with neurogenic claudication 03/17/2017  . Memory disorder 02/16/2016  . Myoclonic jerking   . Neuromuscular disorder ([facility])    lumbar radiculopathy, lumbago  . Nocturnal leg cramps 09/27/2014  . Osteoporosis 03/10/2014  . Pneumonia   . Sickle cell trait ([place])   . Sleep apnea    borderline sleep apnea, states she no longer uses, early 2012- stopped using   . Spinal stenosis of lumbar region with radiculopathy 02/28/2014  . Spondylolisthesis of lumbar region 03/19/2011  . Type II or unspecified type diabetes mellitus without mention of complication, uncontrolled 07/08/2013    Assessment/Plan: 1 Day Post-Op Procedure(s) (LRB): TOTAL KNEE REVISION (Right) Principal Problem:   Failed total knee arthroplasty ([place]) Active Problems:  Failed total right knee replacement (HCC)  Estimated body mass index is 32.06 kg/m as calculated from the following:   Height as of this encounter: 5\' 3"  (1.6 m).   Weight as of this encounter: 82.1 kg. Up with therapy  Anticipated LOS equal to or greater than 2 midnights due to - Age 81 and older with one or more of the following:  - Obesity  - Expected need for hospital services (PT, OT, Nursing) required for safe  discharge  - Anticipated need for postoperative skilled nursing care or inpatient rehab  - Active co-morbidities: None OR   - Unanticipated findings during/Post Surgery: None  - Patient is a high risk of re-admission due to: None     DVT Prophylaxis - Aspirin and TED hose Weight bearing as tolerated. Begin therapy.  Plan for two visits with PT today, and minimally one visit tomorrow, and if meeting goals, will plan for discharge tomorrow.   Patient to follow up with Dr. Wynelle Link in two weeks in clinic.   Plan is to go Home after hospital stay.  Fenton Foy, MBA, PA-C Orthopedic Surgery 07/06/2020, 7:45 AM

## 2020-07-07 ENCOUNTER — Other Ambulatory Visit (HOSPITAL_COMMUNITY): Payer: Self-pay | Admitting: Student

## 2020-07-07 ENCOUNTER — Encounter (HOSPITAL_COMMUNITY): Payer: Self-pay | Admitting: Orthopedic Surgery

## 2020-07-07 LAB — CBC
HCT: 32.7 % — ABNORMAL LOW (ref 36.0–46.0)
Hemoglobin: 10.6 g/dL — ABNORMAL LOW (ref 12.0–15.0)
MCH: 28.1 pg (ref 26.0–34.0)
MCHC: 32.4 g/dL (ref 30.0–36.0)
MCV: 86.7 fL (ref 80.0–100.0)
Platelets: 154 K/uL (ref 150–400)
RBC: 3.77 MIL/uL — ABNORMAL LOW (ref 3.87–5.11)
RDW: 14 % (ref 11.5–15.5)
WBC: 8.3 K/uL (ref 4.0–10.5)
nRBC: 0 % (ref 0.0–0.2)

## 2020-07-07 LAB — BASIC METABOLIC PANEL
Anion gap: 10 (ref 5–15)
BUN: 31 mg/dL — ABNORMAL HIGH (ref 8–23)
CO2: 24 mmol/L (ref 22–32)
Calcium: 9.1 mg/dL (ref 8.9–10.3)
Chloride: 105 mmol/L (ref 98–111)
Creatinine, Ser: 1.06 mg/dL — ABNORMAL HIGH (ref 0.44–1.00)
GFR, Estimated: 53 mL/min — ABNORMAL LOW (ref >60)
Glucose, Bld: 222 mg/dL — ABNORMAL HIGH (ref 70–99)
Potassium: 3.9 mmol/L (ref 3.5–5.1)
Sodium: 139 mmol/L (ref 135–145)

## 2020-07-07 LAB — GLUCOSE, CAPILLARY
Glucose-Capillary: 137 mg/dL — ABNORMAL HIGH (ref 70–99)
Glucose-Capillary: 172 mg/dL — ABNORMAL HIGH (ref 70–99)

## 2020-07-07 MED ORDER — ASPIRIN 325 MG PO TBEC: 325.0000 mg | DELAYED_RELEASE_TABLET | Freq: Two times a day (BID) | ORAL | 0 refills | Status: AC

## 2020-07-07 MED ORDER — TRAMADOL HCL 50 MG PO TABS: 50.0000 mg | ORAL_TABLET | Freq: Four times a day (QID) | ORAL | 0 refills | Status: DC | PRN

## 2020-07-07 MED ORDER — METHOCARBAMOL 500 MG PO TABS: 500.0000 mg | ORAL_TABLET | Freq: Four times a day (QID) | ORAL | 0 refills | Status: DC | PRN

## 2020-07-07 MED ORDER — HYDROCODONE-ACETAMINOPHEN 5-325 MG PO TABS: 1.0000 | ORAL_TABLET | Freq: Four times a day (QID) | ORAL | 0 refills | Status: DC | PRN

## 2020-07-07 NOTE — Progress Notes (Signed)
Physical Therapy Treatment Patient Details Name: [name] MRN: [id number] DOB: 10-31-1940 Today's Date: 07/07/2020    History of Present Illness Pt s/p R TKR revision and with hx of spinal stenosis, Fibromyalgia, and DM with neuropathy    PT Comments    Pt continues very motivated and progressing well with mobility.  Pt up to ambulate increased distance in hall at sup level and performed HEP with assist - written instruction provided.  Pt reports first OP PT 07/10/20.  Follow Up Recommendations  Follow surgeon's recommendation for DC plan and follow-up therapies     Equipment Recommendations  Rolling walker with 5" wheels    Recommendations for Other Services       Precautions / Restrictions Precautions Precautions: Fall Restrictions Weight Bearing Restrictions: No RLE Weight Bearing: Weight bearing as tolerated    Mobility  Bed Mobility               General bed mobility comments: Pt up in chair and requests back to same    Transfers Overall transfer level: Needs assistance Equipment used: Rolling walker (2 wheeled) Transfers: Sit to/from Stand Sit to Stand: Supervision         General transfer comment: min cues for LE management and use of UEs to self assist  Ambulation/Gait Ambulation/Gait assistance: Min guard;Supervision Gait Distance (Feet): 125 Feet Assistive device: Rolling walker (2 wheeled) Gait Pattern/deviations: Step-to pattern;Decreased step length - right;Decreased step length - left;Decreased stance time - right;Shuffle;Trunk flexed Gait velocity: decr   General Gait Details: min cues for posture and position from [employer]             Wheelchair Mobility    Modified Rankin (Stroke Patients Only)       Balance Overall balance assessment: Needs assistance Sitting-balance support: No upper extremity supported;Feet supported Sitting balance-Leahy Scale: Good     Standing balance support: No upper extremity  supported Standing balance-Leahy Scale: Fair                              Cognition Arousal/Alertness: Awake/alert Behavior During Therapy: WFL for tasks assessed/performed Overall Cognitive Status: Within Functional Limits for tasks assessed                                        Exercises Total Joint Exercises Ankle Circles/Pumps: AROM;Both;15 reps;Supine Quad Sets: AROM;Both;Supine;15 reps Heel Slides: AAROM;Right;Supine;15 reps Straight Leg Raises: Right;Supine;AROM;AAROM;15 reps Long Arc Quad: AAROM;Right;10 reps;Seated Goniometric ROM: -5 - 70    General Comments        Pertinent Vitals/Pain Pain Assessment: 0-10 Pain Score: 6  Pain Location: R knee Pain Descriptors / Indicators: Aching;Sore;Grimacing Pain Intervention(s): Limited activity within patient's tolerance;Monitored during session;Premedicated before session;Ice applied    Home Living                      Prior Function            PT Goals (current goals can now be found in the care plan section) Acute Rehab PT Goals Patient Stated Goal: Regain IND PT Goal Formulation: With patient Time For Goal Achievement: 07/13/20 Potential to Achieve Goals: Good Progress towards PT goals: Progressing toward goals    Frequency    7X/week      PT Plan Current plan remains appropriate  Co-evaluation              AM-PAC PT "6 Clicks" Mobility   Outcome Measure  Help needed turning from your back to your side while in a flat bed without using bedrails?: A Little Help needed moving from lying on your back to sitting on the side of a flat bed without using bedrails?: A Little Help needed moving to and from a bed to a chair (including a wheelchair)?: A Little Help needed standing up from a chair using your arms (e.g., wheelchair or bedside chair)?: A Little Help needed to walk in hospital room?: A Little Help needed climbing 3-5 steps with a railing? : A Lot 6  Click Score: 17    End of Session Equipment Utilized During Treatment: Gait belt Activity Tolerance: Patient tolerated treatment well Patient left: in chair;with call bell/phone within reach;with chair alarm set Nurse Communication: Mobility status PT Visit Diagnosis: Difficulty in walking, not elsewhere classified (R26.2);Pain Pain - Right/Left: Right Pain - part of body: Knee     Time: 5697-9480 PT Time Calculation (min) (ACUTE ONLY): 46 min  Charges:  $Gait Training: 23-37 mins $Therapeutic Exercise: 8-22 mins                     [place] Pager [phone] Office [phone]    Ariyan Sinnett 07/07/2020, 10:09 AM

## 2020-07-07 NOTE — Plan of Care (Signed)
Plan of care discussed with pt.

## 2020-07-07 NOTE — Care Management Important Message (Signed)
Important Message  Patient Details IM Letter given to the Patient. Name: [name] MRN: [id number] Date of Birth: 04-29-1940   Medicare Important Message Given:  Yes     Kerin Salen 07/07/2020, 11:19 AM

## 2020-07-07 NOTE — TOC Transition Note (Signed)
Transition of Care [facility]) - CM/SW Discharge Note  Patient Details  Name: [name] MRN: [id number] Date of Birth: 08-01-1940  [facility] of [facility]) CM/SW Contact:  Sherie Don, LCSW Phone Number: 07/07/2020, 11:07 AM  Clinical Narrative: Patient now agreeable to private pay for a rolling walker through [place]. Ovid Curd with [employer] aware and provided walker to patient. Patient requested to speak with CSW regarding payment for [place]. Patient reported she confirmed with OPPT through [facility] that her insurance will cover the PT, but she did not ask about RN. CSW encouraged patient to call [facility] back as [facility] can confirm if RN will also be billed under her insurance. No additional TOC needs identified at this time. TOC signing off.  Final next level of care: OP Rehab Barriers to Discharge: Barriers Resolved  Patient Goals and CMS Choice Patient states their goals for this hospitalization and ongoing recovery are:: Discharge home to [email or web address] Compare Post Acute Care list provided to:: Patient Choice offered to / list presented to : Patient  Discharge Plan and Services In-house Referral: Clinical Social Work        DME Arranged: Gilford Rile rolling DME Agency: [employer] Date DME Agency Contacted: 07/07/20 Representative spoke with at DME Agency: Ovid Curd  Readmission Risk Interventions No flowsheet data found.

## 2020-07-07 NOTE — Progress Notes (Signed)
Subjective: 2 Days Post-Op Procedure(s) (LRB): TOTAL KNEE REVISION (Right) Patient reports pain as mild.   Patient seen in rounds for Dr. Wynelle Link. Patient is well, and has had no acute complaints or problems. States she is feeling well, pain well controlled at this time. Denies chest pain, SOB, or calf pain. No issues overnight. Voiding without difficulty. Plan is to go Home after hospital stay.  Objective: Vital signs in last 24 hours: Temp:  [97.6 F (36.4 C)-99.1 F (37.3 C)] 98.7 F (37.1 C) (03/18 0525) Pulse Rate:  [69-80] 69 (03/18 0525) Resp:  [16-18] 16 (03/18 0525) BP: (106-124)/(55-62) 119/62 (03/18 0525) SpO2:  [93 %-99 %] 94 % (03/18 0731)  Intake/Output from previous day:  Intake/Output Summary (Last 24 hours) at 07/07/2020 0735 Last data filed at 07/06/2020 2100 Gross per 24 hour  Intake 1020 ml  Output --  Net 1020 ml    Intake/Output this shift: No intake/output data recorded.  Labs: Recent Labs    07/06/20 0310 07/07/20 0334  HGB 11.6* 10.6*   Recent Labs    07/06/20 0310 07/07/20 0334  WBC 9.6 8.3  RBC 4.11 3.77*  HCT 35.9* 32.7*  PLT 179 154   Recent Labs    07/06/20 0310 07/07/20 0334  NA 140 139  K 4.1 3.9  CL 105 105  CO2 26 24  BUN 23 31*  CREATININE 1.32* 1.06*  GLUCOSE 211* 222*  CALCIUM 8.8* 9.1   No results for input(s): LABPT, INR in the last 72 hours.  Exam: General - Patient is Alert and Oriented Extremity - Neurologically intact Neurovascular intact Sensation intact distally Dorsiflexion/Plantar flexion intact Dressing/Incision - clean, dry, no drainage Motor Function - intact, moving foot and toes well on exam.   Past Medical History:  Diagnosis Date  . Acute blood loss anemia   . Acute encephalopathy 07/15/2016  . AKI (acute kidney injury) ([place])   . Anemia    has sickle cell trait  . Anxiety   . Arthritis   . Asthma    has used inhaler in past for asthmatic bronchitis, last time- early 2012  .  Bilateral primary osteoarthritis of knee 03/26/2016  . Complication of anesthesia    wakes up shaking  . Diabetes mellitus   . Diabetes mellitus with neuropathy ([place]) 10/29/2012  . Diverticulitis   . Dyslipidemia   . Dyspnea    with exertion  . Encephalopathy 06/2016   due to medications after surgery  . Fibromyalgia   . GERD (gastroesophageal reflux disease)    occas. use of  Prilosec  . Heart murmur    sees Dr. Montez Morita, last seen- early 2012  . Herniated nucleus pulposus, L2-3 06/25/2016  . History of back surgery   . Hypertension    02/2010- stress test /w PCP  . Hypothyroidism   . Loose bowel movements 12/2016  . Lumbar radiculopathy 06/27/2016  . Lumbar stenosis with neurogenic claudication 03/17/2017  . Memory disorder 02/16/2016  . Myoclonic jerking   . Neuromuscular disorder ([facility])    lumbar radiculopathy, lumbago  . Nocturnal leg cramps 09/27/2014  . Osteoporosis 03/10/2014  . Pneumonia   . Sickle cell trait ([place])   . Sleep apnea    borderline sleep apnea, states she no longer uses, early 2012- stopped using   . Spinal stenosis of lumbar region with radiculopathy 02/28/2014  . Spondylolisthesis of lumbar region 03/19/2011  . Type II or unspecified type diabetes mellitus without mention of complication, uncontrolled 07/08/2013  Assessment/Plan: 2 Days Post-Op Procedure(s) (LRB): TOTAL KNEE REVISION (Right) Principal Problem:   Failed total knee arthroplasty (HCC) Active Problems:   Failed total right knee replacement (HCC)  Estimated body mass index is 32.06 kg/m as calculated from the following:   Height as of this encounter: 5\' 3"  (1.6 m).   Weight as of this encounter: 82.1 kg. Up with therapy D/C IV fluids  DVT Prophylaxis - Aspirin Weight-bearing as tolerated  Plan for discharge around lunchtime if meeting goals with PT. Scheduled for OPPT at [facility]) Follow-up in the office in 2 weeks  The PDMP database was reviewed today prior to any  opioid medications being prescribed to this patient.  Theresa Duty, PA-C Orthopedic Surgery [phone] 07/07/2020, 7:35 AM

## 2020-07-10 DIAGNOSIS — M6281 Muscle weakness (generalized): Secondary | ICD-10-CM | POA: Diagnosis not present

## 2020-07-10 DIAGNOSIS — R2689 Other abnormalities of gait and mobility: Secondary | ICD-10-CM | POA: Diagnosis not present

## 2020-07-10 DIAGNOSIS — Z9713 Presence of artificial right leg (complete) (partial): Secondary | ICD-10-CM | POA: Diagnosis not present

## 2020-07-10 DIAGNOSIS — Z96653 Presence of artificial knee joint, bilateral: Secondary | ICD-10-CM | POA: Diagnosis not present

## 2020-07-10 NOTE — Discharge Summary (Signed)
Physician Discharge Summary   Patient ID: [name] MRN: [id number] DOB/AGE: April 18, 1941 80 y.o.  Admit date: 07/05/2020 Discharge date: 07/07/2020  Primary Diagnosis: Failed right total knee arthroplasty secondary to malalignment and loosening   Admission Diagnoses:  Past Medical History:  Diagnosis Date  . Acute blood loss anemia   . Acute encephalopathy 07/15/2016  . AKI (acute kidney injury) ([place])   . Anemia    has sickle cell trait  . Anxiety   . Arthritis   . Asthma    has used inhaler in past for asthmatic bronchitis, last time- early 2012  . Bilateral primary osteoarthritis of knee 03/26/2016  . Complication of anesthesia    wakes up shaking  . Diabetes mellitus   . Diabetes mellitus with neuropathy ([place]) 10/29/2012  . Diverticulitis   . Dyslipidemia   . Dyspnea    with exertion  . Encephalopathy 06/2016   due to medications after surgery  . Fibromyalgia   . GERD (gastroesophageal reflux disease)    occas. use of  Prilosec  . Heart murmur    sees Dr. Montez Morita, last seen- early 2012  . Herniated nucleus pulposus, L2-3 06/25/2016  . History of back surgery   . Hypertension    02/2010- stress test /w PCP  . Hypothyroidism   . Loose bowel movements 12/2016  . Lumbar radiculopathy 06/27/2016  . Lumbar stenosis with neurogenic claudication 03/17/2017  . Memory disorder 02/16/2016  . Myoclonic jerking   . Neuromuscular disorder (H[facility])    lumbar radiculopathy, lumbago  . Nocturnal leg cramps 09/27/2014  . Osteoporosis 03/10/2014  . Pneumonia   . Sickle cell trait ([place])   . Sleep apnea    borderline sleep apnea, states she no longer uses, early 2012- stopped using   . Spinal stenosis of lumbar region with radiculopathy 02/28/2014  . Spondylolisthesis of lumbar region 03/19/2011  . Type II or unspecified type diabetes mellitus without mention of complication, uncontrolled 07/08/2013   Discharge Diagnoses:   Principal Problem:   Failed total knee arthroplasty  ([place]) Active Problems:   Failed total right knee replacement ([place])  Estimated body mass index is 32.06 kg/m as calculated from the following:   Height as of this encounter: _0  (1.6 m).   Weight as of this encounter: 82.1 kg.  Procedure:  Procedure(s) (LRB): TOTAL KNEE REVISION (Right)   Consults: None  HPI: The patient is an 80 year old female who had a right total knee arthroplasty done last year.  She states that she has persistent pain and a significant deformity of her leg since the surgery.  All of this has gotten progressively  worse.  I saw her in second opinion.  She was noted to have significant valgus in the right knee.  She had a workup for infection, which was negative.  I was concerned about loosening of the prosthesis due to a significant tilt in the tibial component.  She presents now for total knee arthroplasty revision.  Laboratory Data: Admission on 07/05/2020, Discharged on 07/07/2020  Component Date Value Ref Range Status  . Glucose-Capillary 07/05/2020 158* 70 - 99 mg/dL Final   Glucose reference range applies only to samples taken after fasting for at least 8 hours.  . Glucose-Capillary 07/05/2020 188* 70 - 99 mg/dL Final   Glucose reference range applies only to samples taken after fasting for at least 8 hours.  . WBC 07/06/2020 9.6  4.0 - 10.5 K/uL Final  . RBC 07/06/2020 4.11  3.87 - 5.11 MIL/uL  Final  . Hemoglobin 07/06/2020 11.6* 12.0 - 15.0 g/dL Final  . HCT 07/06/2020 35.9* 36.0 - 46.0 % Final  . MCV 07/06/2020 87.3  80.0 - 100.0 fL Final  . MCH 07/06/2020 28.2  26.0 - 34.0 pg Final  . MCHC 07/06/2020 32.3  30.0 - 36.0 g/dL Final  . RDW 07/06/2020 14.0  11.5 - 15.5 % Final  . Platelets 07/06/2020 179  150 - 400 K/uL Final  . nRBC 07/06/2020 0.0  0.0 - 0.2 % Final   Performed at [facility], [place] [address], [place], [place] [address]  . Sodium 07/06/2020 140  135 - 145 mmol/L Final  . Potassium 07/06/2020 4.1  3.5 - 5.1 mmol/L  Final  . Chloride 07/06/2020 105  98 - 111 mmol/L Final  . CO2 07/06/2020 26  22 - 32 mmol/L Final  . Glucose, Bld 07/06/2020 211* 70 - 99 mg/dL Final   Glucose reference range applies only to samples taken after fasting for at least 8 hours.  . BUN 07/06/2020 23  8 - 23 mg/dL Final  . Creatinine, Ser 07/06/2020 1.32* 0.44 - 1.00 mg/dL Final  . Calcium 07/06/2020 8.8* 8.9 - 10.3 mg/dL Final  . GFR, Estimated 07/06/2020 41* >60 mL/min Final   Comment: (NOTE) Calculated using the CKD-EPI Creatinine Equation (2021)   . Anion gap 07/06/2020 9  5 - 15 Final   Performed at [facility], [place] [address] [place], [place] [address]  . Glucose-Capillary 07/05/2020 231* 70 - 99 mg/dL Final   Glucose reference range applies only to samples taken after fasting for at least 8 hours.  . Glucose-Capillary 07/06/2020 245* 70 - 99 mg/dL Final   Glucose reference range applies only to samples taken after fasting for at least 8 hours.  . Glucose-Capillary 07/06/2020 152* 70 - 99 mg/dL Final   Glucose reference range applies only to samples taken after fasting for at least 8 hours.  . WBC 07/07/2020 8.3  4.0 - 10.5 K/uL Final  . RBC 07/07/2020 3.77* 3.87 - 5.11 MIL/uL Final  . Hemoglobin 07/07/2020 10.6* 12.0 - 15.0 g/dL Final  . HCT 07/07/2020 32.7* 36.0 - 46.0 % Final  . MCV 07/07/2020 86.7  80.0 - 100.0 fL Final  . MCH 07/07/2020 28.1  26.0 - 34.0 pg Final  . MCHC 07/07/2020 32.4  30.0 - 36.0 g/dL Final  . RDW 07/07/2020 14.0  11.5 - 15.5 % Final  . Platelets 07/07/2020 154  150 - 400 K/uL Final  . nRBC 07/07/2020 0.0  0.0 - 0.2 % Final   Performed at [facility], [place] [address], [place], [place] [address]  . Sodium 07/07/2020 139  135 - 145 mmol/L Final  . Potassium 07/07/2020 3.9  3.5 - 5.1 mmol/L Final  . Chloride 07/07/2020 105  98 - 111 mmol/L Final  . CO2 07/07/2020 24  22 - 32 mmol/L Final  . Glucose, Bld 07/07/2020 222* 70 - 99 mg/dL Final   Glucose  reference range applies only to samples taken after fasting for at least 8 hours.  . BUN 07/07/2020 31* 8 - 23 mg/dL Final  . Creatinine, Ser 07/07/2020 1.06* 0.44 - 1.00 mg/dL Final  . Calcium 07/07/2020 9.1  8.9 - 10.3 mg/dL Final  . GFR, Estimated 07/07/2020 53* >60 mL/min Final   Comment: (NOTE) Calculated using the CKD-EPI Creatinine Equation (2021)   . Anion gap 07/07/2020 10  5 - 15 Final   Performed at [facility], [place] [address] [place], [place] [address]  . Glucose-Capillary 07/06/2020 216* 70 - 99 mg/dL Final   Glucose reference range applies only to samples taken after fasting for at least 8 hours.  . Glucose-Capillary 07/06/2020 184* 70 - 99 mg/dL Final   Glucose reference range applies only to samples taken after fasting for at least 8 hours.  . Glucose-Capillary 07/07/2020 137* 70 - 99 mg/dL Final   Glucose reference range applies only to samples taken after fasting for at least 8 hours.  . Glucose-Capillary 07/07/2020 172* 70 - 99 mg/dL Final   Glucose reference range applies only to samples taken after fasting for at least 8 hours.  Hospital Outpatient Visit on 07/03/2020  Component Date Value Ref Range Status  . SARS Coronavirus 2 07/03/2020 NEGATIVE  NEGATIVE Final   Comment: (NOTE) SARS-CoV-2 target nucleic acids are NOT DETECTED.  The SARS-CoV-2 RNA is generally detectable in upper and lower respiratory specimens during the acute phase of infection. Negative results do not preclude SARS-CoV-2 infection, do not rule out co-infections with other pathogens, and should not be used as the sole basis for treatment or other patient management decisions. Negative results must be combined with clinical observations, patient history, and epidemiological information. The expected result is Negative.  Fact Sheet for Patients: SugarRoll.be  Fact Sheet for Healthcare  Providers: https://www.woods-mathews.com/  This test is not yet approved or cleared by the [place] FDA and  has been authorized for detection and/or diagnosis of SARS-CoV-2 by FDA under an Emergency Use Authorization (EUA). This EUA will remain  in effect (meaning this test can be used) for the duration of the COVID-19 declaration under Se                          ction 564(b)(1) of the Act, 21 U.S.C. section 360bbb-3(b)(1), unless the authorization is terminated or revoked sooner.  Performed at [facility], [place] [address] [place], [place] [address]   Hospital Outpatient Visit on 06/22/2020  Component Date Value Ref Range Status  . MRSA, PCR 06/22/2020 NEGATIVE  NEGATIVE Final  . Staphylococcus aureus 06/22/2020 POSITIVE* NEGATIVE Final   Comment: (NOTE) The Xpert SA Assay (FDA approved for NASAL specimens in patients 61 years of age and older), is one component of a comprehensive surveillance program. It is not intended to diagnose infection nor to guide or monitor treatment. Performed at [facility], [place] [address], [place], [place] [address]   . WBC 06/22/2020 6.2  4.0 - 10.5 K/uL Final  . RBC 06/22/2020 5.00  3.87 - 5.11 MIL/uL Final  . Hemoglobin 06/22/2020 14.1  12.0 - 15.0 g/dL Final  . HCT 06/22/2020 43.4  36.0 - 46.0 % Final  . MCV 06/22/2020 86.8  80.0 - 100.0 fL Final  . MCH 06/22/2020 28.2  26.0 - 34.0 pg Final  . MCHC 06/22/2020 32.5  30.0 - 36.0 g/dL Final  . RDW 06/22/2020 14.0  11.5 - 15.5 % Final  . Platelets 06/22/2020 194  150 - 400 K/uL Final  . nRBC 06/22/2020 0.0  0.0 - 0.2 % Final   Performed at [facility], [place] [address], [place], [place] [address]  . Sodium 06/22/2020 143  135 - 145 mmol/L Final  . Potassium 06/22/2020 3.9  3.5 - 5.1 mmol/L Final  . Chloride 06/22/2020 105  98 - 111 mmol/L Final  . CO2 06/22/2020 30  22 - 32 mmol/L Final  .  Glucose, Bld 06/22/2020 68* 70 - 99 mg/dL  Final   Glucose reference range applies only to samples taken after fasting for at least 8 hours.  . BUN 06/22/2020 22  8 - 23 mg/dL Final  . Creatinine, Ser 06/22/2020 1.02* 0.44 - 1.00 mg/dL Final  . Calcium 06/22/2020 9.8  8.9 - 10.3 mg/dL Final  . Total Protein 06/22/2020 7.0  6.5 - 8.1 g/dL Final  . Albumin 06/22/2020 4.3  3.5 - 5.0 g/dL Final  . AST 06/22/2020 16  15 - 41 U/L Final  . ALT 06/22/2020 12  0 - 44 U/L Final  . Alkaline Phosphatase 06/22/2020 63  38 - 126 U/L Final  . Total Bilirubin 06/22/2020 0.9  0.3 - 1.2 mg/dL Final  . GFR, Estimated 06/22/2020 56* >60 mL/min Final   Comment: (NOTE) Calculated using the CKD-EPI Creatinine Equation (2021)   . Anion gap 06/22/2020 8  5 - 15 Final   Performed at [facility], [place] [address], [place], [place] [address]  . Prothrombin Time 06/22/2020 12.8  11.4 - 15.2 seconds Final  . INR 06/22/2020 1.0  0.8 - 1.2 Final   Comment: (NOTE) INR goal varies based on device and disease states. Performed at [facility], [place] [address], [place], [place] [address]   . aPTT 06/22/2020 28  24 - 36 seconds Final   Performed at [facility], [place] [address] [place], [place] [address]  . ABO/RH(D) 06/22/2020 B POS   Final  . Antibody Screen 06/22/2020 NEG   Final  . Sample Expiration 06/22/2020 07/06/2020,2359   Final  . Extend sample reason 06/22/2020    Final                   Value:NO TRANSFUSIONS OR PREGNANCY IN THE PAST 3 MONTHS Performed at [facility], [place] [address], [place], [place] [address]   . Hgb A1c MFr Bld 06/22/2020 6.9* 4.8 - 5.6 % Final   Comment: (NOTE) Pre diabetes:          5.7%-6.4%  Diabetes:              >6.4%  Glycemic control for   <7.0% adults with diabetes   . Mean Plasma Glucose 06/22/2020 151.33  mg/dL Final   Performed at [place] [address] [place], [place] [address]  . Glucose-Capillary 06/22/2020 118* 70 - 99  mg/dL Final   Glucose reference range applies only to samples taken after fasting for at least 8 hours.  Lab on 06/12/2020  Component Date Value Ref Range Status  . Sodium 06/12/2020 142  135 - 145 mEq/L Final  . Potassium 06/12/2020 3.6  3.5 - 5.1 mEq/L Final  . Chloride 06/12/2020 105  96 - 112 mEq/L Final  . CO2 06/12/2020 30  19 - 32 mEq/L Final  . Glucose, Bld 06/12/2020 69* 70 - 99 mg/dL Final  . BUN 06/12/2020 13  6 - 23 mg/dL Final  . Creatinine, Ser 06/12/2020 0.99  0.40 - 1.20 mg/dL Final  . GFR 06/12/2020 53.86* >60.00 mL/min Final   Calculated using the CKD-EPI Creatinine Equation (2021)  . Calcium 06/12/2020 10.0  8.4 - 10.5 mg/dL Final  . Hgb A1c MFr Bld 06/12/2020 6.8* 4.6 - 6.5 % Final   Glycemic Control Guidelines for People with Diabetes:Non Diabetic:  <6%Goal of Therapy: <7%Additional Action Suggested:  >8%      X-Rays:No results found.  EKG: Orders placed or  performed during the hospital encounter of 09/20/19  . EKG 12-Lead  . EKG 12-Lead  . EKG 12-Lead  . EKG 12-Lead  . EKG 12-Lead  . EKG 12-Lead     Hospital Course: BRIDGIT EYNON is a 80 y.o. who was admitted to [facility]. They were brought to the operating room on 07/05/2020 and underwent Procedure(s): TOTAL KNEE REVISION.  Patient tolerated the procedure well and was later transferred to the recovery room and then to the orthopaedic floor for postoperative care. They were given PO and IV analgesics for pain control following their surgery. They were given 24 hours of postoperative antibiotics of  Anti-infectives (From admission, onward)   Start     Dose/Rate Route Frequency Ordered Stop   07/05/20 1830  ceFAZolin (ANCEF) IVPB 2g/100 mL premix        2 g 200 mL/hr over 30 Minutes Intravenous Every 6 hours 07/05/20 1732 07/06/20 0033   07/05/20 1115  ceFAZolin (ANCEF) IVPB 2g/100 mL premix        2 g 200 mL/hr over 30 Minutes Intravenous On call to O.R. 07/05/20 1113 07/05/20 1255     and  started on DVT prophylaxis in the form of Aspirin.   PT and OT were ordered for total joint protocol. Discharge planning consulted to help with postop disposition and equipment needs. Patient had a good night on the evening of surgery. They started to get up OOB with therapy on POD #1. Continued to work with therapy into POD #2. Pt was seen during rounds on day two and was ready to go home pending progress with therapy. Dressing was changed and the incision was clean, dry, and intact. Pt worked with therapy for one additional session and was meeting their goals. She was discharged to home later that day in stable condition.  Diet: Diabetic diet Activity: WBAT Follow-up: in 2 weeks Disposition: Home with OPPT Discharged Condition: stable   Discharge Instructions    Call MD / Call 911   Complete by: As directed    If you experience chest pain or shortness of breath, CALL 911 and be transported to the hospital emergency room.  If you develope a fever above 101 F, pus (white drainage) or increased drainage or redness at the wound, or calf pain, call your surgeon's office.   Change dressing   Complete by: As directed    You may remove the bulky bandage (ACE wrap and gauze) two days after surgery. You will have an adhesive waterproof bandage underneath. Leave this in place until your first follow-up appointment.   Constipation Prevention   Complete by: As directed    Drink plenty of fluids.  Prune juice may be helpful.  You may use a stool softener, such as Colace (over the counter) 100 mg twice a day.  Use MiraLax (over the counter) for constipation as needed.   Diet - low sodium heart healthy   Complete by: As directed    Do not put a pillow under the knee. Place it under the heel.   Complete by: As directed    Driving restrictions   Complete by: As directed    No driving for two weeks   TED hose   Complete by: As directed    Use stockings (TED hose) for three weeks on both leg(s).  You may  remove them at night for sleeping.   Weight bearing as tolerated   Complete by: As directed      Allergies as  of 07/07/2020      Reactions   Cymbalta [duloxetine Hcl] Diarrhea, Other (See Comments)   Dizziness, headache, irritability   Amlodipine Besylate Swelling   Baclofen    jerks    Betadine [povidone Iodine] Swelling, Other (See Comments)   SWELLING REACTION DESCRIPTION/SEVERITY UNSPECIFIED  Reaction to betadine on eye lid and it swelled up   Erythromycin Base Other (See Comments)   unknown   Oxycodone Hcl    Percocet [oxycodone-acetaminophen] Other (See Comments)   Confusion change personality   Povidone-iodine Swelling   Soap Other (See Comments)   unknown   Statins Other (See Comments)   Muscle cramps   Adhesive [tape] Rash   Lipitor [atorvastatin] Rash      Medication List    STOP taking these medications   aspirin 81 MG chewable tablet Replaced by: aspirin 325 MG EC tablet   ibuprofen 800 MG tablet Commonly known as: ADVIL   meloxicam 15 MG tablet Commonly known as: MOBIC     TAKE these medications   Accu-Chek Aviva Plus test strip Generic drug: glucose blood TEST BLOOD SUGAR FOUR TIMES DAILY AS DIRECTED   Accu-Chek Aviva Plus w/Device Kit Use Accu Chek Aviva as instructed to check blood sugar 4 times daily.   acetaminophen 500 MG tablet Commonly known as: TYLENOL Take 1,000 mg by mouth every 8 (eight) hours as needed for mild pain or moderate pain.   aspirin 325 MG EC tablet Take 1 tablet (325 mg total) by mouth 2 (two) times daily for 19 days. Then take one 81 mg aspirin once a day for three weeks. Then discontinue aspirin. Replaces: aspirin 81 MG chewable tablet   BD Pen Needle Nano U/F 32G X 4 MM Misc Generic drug: Insulin Pen Needle Use to inject insulin 4 times daily.   Bystolic 5 MG tablet Generic drug: nebivolol Take 5 mg by mouth at bedtime. Nebivolol   Cal-Mag-Zinc-D Tabs Take 1 tablet by mouth daily. 330 mg /130 mg/ 5 mg /5 mg    cetirizine 10 MG tablet Commonly known as: ZYRTEC Take 1 tablet (10 mg total) by mouth daily as needed for allergies. What changed: when to take this   dapagliflozin propanediol 5 MG Tabs tablet Commonly known as: Farxiga TAKE 1 TABLET EVERY DAY What changed:   how much to take  how to take this  when to take this  additional instructions   fluticasone 50 MCG/ACT nasal spray Commonly known as: FLONASE Place 1 spray into both nostrils daily as needed for allergies. What changed:   how much to take  when to take this   gabapentin 600 MG tablet Commonly known as: NEURONTIN TAKE 1 TABLET(600 MG) BY MOUTH THREE TIMES DAILY What changed: See the new instructions.   HYDROcodone-acetaminophen 5-325 MG tablet Commonly known as: NORCO/VICODIN Take 1-2 tablets by mouth every 6 (six) hours as needed for moderate pain or severe pain. What changed:   how much to take  reasons to take this   irbesartan-hydrochlorothiazide 300-12.5 MG tablet Commonly known as: AVALIDE Take 1 tablet by mouth daily.   ketoconazole 2 % cream Commonly known as: NIZORAL Apply 1 application topically daily as needed (Itching).   levothyroxine 50 MCG tablet Commonly known as: SYNTHROID TAKE 1 TABLET(50 MCG) BY MOUTH DAILY What changed:   how much to take  how to take this  when to take this  additional instructions   methocarbamol 500 MG tablet Commonly known as: ROBAXIN Take 1 tablet (500 mg total)  by mouth every 6 (six) hours as needed for muscle spasms.   NovoLOG FlexPen 100 UNIT/ML FlexPen Generic drug: insulin aspart INJECT 6 UNITS BEFORE BREAKFAST, 9 UNITS AT LUNCH, 8-10 UNITS AT DINNER, 3-4 UNITS AS NEEDED FOR LARGE CARBS OR ICE CREAM What changed:   how much to take  how to take this  when to take this  additional instructions   Repatha Pushtronex System 420 MG/3.5ML Soct Generic drug: Evolocumab with Infusor Inject 420 mg into the skin every 30 (thirty) days.    Symbicort 80-4.5 MCG/ACT inhaler Generic drug: budesonide-formoterol Inhale 2 puffs into the lungs 2 (two) times daily as needed (shortness of breath). What changed: when to take this   traMADol 50 MG tablet Commonly known as: ULTRAM Take 1-2 tablets (50-100 mg total) by mouth every 6 (six) hours as needed for moderate pain.   Tyler Aas FlexTouch 100 UNIT/ML FlexTouch Pen Generic drug: insulin degludec Inject 24 Units into the skin daily.            Discharge Care Instructions  (From admission, onward)         Start     Ordered   07/07/20 0000  Weight bearing as tolerated        07/07/20 0740   07/07/20 0000  Change dressing       Comments: You may remove the bulky bandage (ACE wrap and gauze) two days after surgery. You will have an adhesive waterproof bandage underneath. Leave this in place until your first follow-up appointment.   07/07/20 0740          Follow-up Information    Gaynelle Arabian, MD. Schedule an appointment as soon as possible for a visit on 07/18/2020.   Specialty: Orthopedic Surgery Contact information: [address] [place] [address] [phone]               Signed: Theresa Duty, PA-C Orthopedic Surgery 07/10/2020, 7:13 AM

## 2020-07-11 ENCOUNTER — Telehealth: Payer: Self-pay | Admitting: Endocrinology

## 2020-07-11 NOTE — Telephone Encounter (Signed)
Sherry with [facility] requests to be called at ph# [phone] re: Status of Physician's Order and Medical Records request forms that were faxed to Fax# [phone] on 06/26/20 and 07/04/20.

## 2020-07-12 DIAGNOSIS — R2689 Other abnormalities of gait and mobility: Secondary | ICD-10-CM | POA: Diagnosis not present

## 2020-07-12 DIAGNOSIS — Z96653 Presence of artificial knee joint, bilateral: Secondary | ICD-10-CM | POA: Diagnosis not present

## 2020-07-12 DIAGNOSIS — M6281 Muscle weakness (generalized): Secondary | ICD-10-CM | POA: Diagnosis not present

## 2020-07-12 DIAGNOSIS — Z9713 Presence of artificial right leg (complete) (partial): Secondary | ICD-10-CM | POA: Diagnosis not present

## 2020-07-14 DIAGNOSIS — Z96653 Presence of artificial knee joint, bilateral: Secondary | ICD-10-CM | POA: Diagnosis not present

## 2020-07-14 DIAGNOSIS — Z9713 Presence of artificial right leg (complete) (partial): Secondary | ICD-10-CM | POA: Diagnosis not present

## 2020-07-14 DIAGNOSIS — R2689 Other abnormalities of gait and mobility: Secondary | ICD-10-CM | POA: Diagnosis not present

## 2020-07-14 DIAGNOSIS — M6281 Muscle weakness (generalized): Secondary | ICD-10-CM | POA: Diagnosis not present

## 2020-07-17 DIAGNOSIS — R2689 Other abnormalities of gait and mobility: Secondary | ICD-10-CM | POA: Diagnosis not present

## 2020-07-17 DIAGNOSIS — Z96653 Presence of artificial knee joint, bilateral: Secondary | ICD-10-CM | POA: Diagnosis not present

## 2020-07-17 DIAGNOSIS — M6281 Muscle weakness (generalized): Secondary | ICD-10-CM | POA: Diagnosis not present

## 2020-07-17 DIAGNOSIS — Z9713 Presence of artificial right leg (complete) (partial): Secondary | ICD-10-CM | POA: Diagnosis not present

## 2020-07-17 NOTE — Telephone Encounter (Signed)
Spoke to [employer]) --stated forms are complete and notes are received but will notified  pt due consent form was sent to the office have mistake typo only have [place] instead  libre 2. But no additional action needed.

## 2020-07-19 DIAGNOSIS — Z96653 Presence of artificial knee joint, bilateral: Secondary | ICD-10-CM | POA: Diagnosis not present

## 2020-07-19 DIAGNOSIS — Z9713 Presence of artificial right leg (complete) (partial): Secondary | ICD-10-CM | POA: Diagnosis not present

## 2020-07-19 DIAGNOSIS — R2689 Other abnormalities of gait and mobility: Secondary | ICD-10-CM | POA: Diagnosis not present

## 2020-07-19 DIAGNOSIS — M6281 Muscle weakness (generalized): Secondary | ICD-10-CM | POA: Diagnosis not present

## 2020-07-26 DIAGNOSIS — M6281 Muscle weakness (generalized): Secondary | ICD-10-CM | POA: Diagnosis not present

## 2020-07-26 DIAGNOSIS — Z96653 Presence of artificial knee joint, bilateral: Secondary | ICD-10-CM | POA: Diagnosis not present

## 2020-07-26 DIAGNOSIS — Z9713 Presence of artificial right leg (complete) (partial): Secondary | ICD-10-CM | POA: Diagnosis not present

## 2020-07-26 DIAGNOSIS — R2689 Other abnormalities of gait and mobility: Secondary | ICD-10-CM | POA: Diagnosis not present

## 2020-07-28 DIAGNOSIS — M6281 Muscle weakness (generalized): Secondary | ICD-10-CM | POA: Diagnosis not present

## 2020-07-28 DIAGNOSIS — Z96653 Presence of artificial knee joint, bilateral: Secondary | ICD-10-CM | POA: Diagnosis not present

## 2020-07-28 DIAGNOSIS — Z9713 Presence of artificial right leg (complete) (partial): Secondary | ICD-10-CM | POA: Diagnosis not present

## 2020-07-28 DIAGNOSIS — R2689 Other abnormalities of gait and mobility: Secondary | ICD-10-CM | POA: Diagnosis not present

## 2020-08-01 DIAGNOSIS — E1165 Type 2 diabetes mellitus with hyperglycemia: Secondary | ICD-10-CM | POA: Diagnosis not present

## 2020-08-08 DIAGNOSIS — Z4789 Encounter for other orthopedic aftercare: Secondary | ICD-10-CM | POA: Diagnosis not present

## 2020-08-09 DIAGNOSIS — M6281 Muscle weakness (generalized): Secondary | ICD-10-CM | POA: Diagnosis not present

## 2020-08-09 DIAGNOSIS — Z9713 Presence of artificial right leg (complete) (partial): Secondary | ICD-10-CM | POA: Diagnosis not present

## 2020-08-09 DIAGNOSIS — Z96653 Presence of artificial knee joint, bilateral: Secondary | ICD-10-CM | POA: Diagnosis not present

## 2020-08-09 DIAGNOSIS — R2689 Other abnormalities of gait and mobility: Secondary | ICD-10-CM | POA: Diagnosis not present

## 2020-08-11 DIAGNOSIS — Z96653 Presence of artificial knee joint, bilateral: Secondary | ICD-10-CM | POA: Diagnosis not present

## 2020-08-11 DIAGNOSIS — M6281 Muscle weakness (generalized): Secondary | ICD-10-CM | POA: Diagnosis not present

## 2020-08-11 DIAGNOSIS — Z9713 Presence of artificial right leg (complete) (partial): Secondary | ICD-10-CM | POA: Diagnosis not present

## 2020-08-11 DIAGNOSIS — R2689 Other abnormalities of gait and mobility: Secondary | ICD-10-CM | POA: Diagnosis not present

## 2020-08-14 DIAGNOSIS — M6281 Muscle weakness (generalized): Secondary | ICD-10-CM | POA: Diagnosis not present

## 2020-08-14 DIAGNOSIS — Z9713 Presence of artificial right leg (complete) (partial): Secondary | ICD-10-CM | POA: Diagnosis not present

## 2020-08-14 DIAGNOSIS — R2689 Other abnormalities of gait and mobility: Secondary | ICD-10-CM | POA: Diagnosis not present

## 2020-08-14 DIAGNOSIS — Z96653 Presence of artificial knee joint, bilateral: Secondary | ICD-10-CM | POA: Diagnosis not present

## 2020-08-15 ENCOUNTER — Other Ambulatory Visit: Payer: Self-pay | Admitting: Endocrinology

## 2020-08-16 DIAGNOSIS — R2689 Other abnormalities of gait and mobility: Secondary | ICD-10-CM | POA: Diagnosis not present

## 2020-08-16 DIAGNOSIS — Z9713 Presence of artificial right leg (complete) (partial): Secondary | ICD-10-CM | POA: Diagnosis not present

## 2020-08-16 DIAGNOSIS — Z96653 Presence of artificial knee joint, bilateral: Secondary | ICD-10-CM | POA: Diagnosis not present

## 2020-08-16 DIAGNOSIS — M6281 Muscle weakness (generalized): Secondary | ICD-10-CM | POA: Diagnosis not present

## 2020-08-18 DIAGNOSIS — M6281 Muscle weakness (generalized): Secondary | ICD-10-CM | POA: Diagnosis not present

## 2020-08-18 DIAGNOSIS — Z96653 Presence of artificial knee joint, bilateral: Secondary | ICD-10-CM | POA: Diagnosis not present

## 2020-08-18 DIAGNOSIS — R2689 Other abnormalities of gait and mobility: Secondary | ICD-10-CM | POA: Diagnosis not present

## 2020-08-18 DIAGNOSIS — Z9713 Presence of artificial right leg (complete) (partial): Secondary | ICD-10-CM | POA: Diagnosis not present

## 2020-08-21 DIAGNOSIS — Z96653 Presence of artificial knee joint, bilateral: Secondary | ICD-10-CM | POA: Diagnosis not present

## 2020-08-21 DIAGNOSIS — M6281 Muscle weakness (generalized): Secondary | ICD-10-CM | POA: Diagnosis not present

## 2020-08-21 DIAGNOSIS — Z9713 Presence of artificial right leg (complete) (partial): Secondary | ICD-10-CM | POA: Diagnosis not present

## 2020-08-21 DIAGNOSIS — R2689 Other abnormalities of gait and mobility: Secondary | ICD-10-CM | POA: Diagnosis not present

## 2020-08-24 DIAGNOSIS — R2689 Other abnormalities of gait and mobility: Secondary | ICD-10-CM | POA: Diagnosis not present

## 2020-08-24 DIAGNOSIS — Z9713 Presence of artificial right leg (complete) (partial): Secondary | ICD-10-CM | POA: Diagnosis not present

## 2020-08-24 DIAGNOSIS — M6281 Muscle weakness (generalized): Secondary | ICD-10-CM | POA: Diagnosis not present

## 2020-08-24 DIAGNOSIS — Z96653 Presence of artificial knee joint, bilateral: Secondary | ICD-10-CM | POA: Diagnosis not present

## 2020-08-25 ENCOUNTER — Other Ambulatory Visit: Payer: Self-pay | Admitting: Endocrinology

## 2020-08-25 DIAGNOSIS — Z96653 Presence of artificial knee joint, bilateral: Secondary | ICD-10-CM | POA: Diagnosis not present

## 2020-08-25 DIAGNOSIS — M6281 Muscle weakness (generalized): Secondary | ICD-10-CM | POA: Diagnosis not present

## 2020-08-25 DIAGNOSIS — Z9713 Presence of artificial right leg (complete) (partial): Secondary | ICD-10-CM | POA: Diagnosis not present

## 2020-08-25 DIAGNOSIS — R2689 Other abnormalities of gait and mobility: Secondary | ICD-10-CM | POA: Diagnosis not present

## 2020-08-30 DIAGNOSIS — Z96653 Presence of artificial knee joint, bilateral: Secondary | ICD-10-CM | POA: Diagnosis not present

## 2020-08-30 DIAGNOSIS — R2689 Other abnormalities of gait and mobility: Secondary | ICD-10-CM | POA: Diagnosis not present

## 2020-08-30 DIAGNOSIS — M6281 Muscle weakness (generalized): Secondary | ICD-10-CM | POA: Diagnosis not present

## 2020-08-30 DIAGNOSIS — Z9713 Presence of artificial right leg (complete) (partial): Secondary | ICD-10-CM | POA: Diagnosis not present

## 2020-08-31 ENCOUNTER — Encounter (HOSPITAL_BASED_OUTPATIENT_CLINIC_OR_DEPARTMENT_OTHER): Payer: Self-pay

## 2020-08-31 ENCOUNTER — Encounter (HOSPITAL_BASED_OUTPATIENT_CLINIC_OR_DEPARTMENT_OTHER): Payer: Medicare PPO | Admitting: Internal Medicine

## 2020-09-01 DIAGNOSIS — R2689 Other abnormalities of gait and mobility: Secondary | ICD-10-CM | POA: Diagnosis not present

## 2020-09-01 DIAGNOSIS — Z96653 Presence of artificial knee joint, bilateral: Secondary | ICD-10-CM | POA: Diagnosis not present

## 2020-09-01 DIAGNOSIS — Z9713 Presence of artificial right leg (complete) (partial): Secondary | ICD-10-CM | POA: Diagnosis not present

## 2020-09-01 DIAGNOSIS — M6281 Muscle weakness (generalized): Secondary | ICD-10-CM | POA: Diagnosis not present

## 2020-09-04 DIAGNOSIS — Z9713 Presence of artificial right leg (complete) (partial): Secondary | ICD-10-CM | POA: Diagnosis not present

## 2020-09-04 DIAGNOSIS — Z96653 Presence of artificial knee joint, bilateral: Secondary | ICD-10-CM | POA: Diagnosis not present

## 2020-09-04 DIAGNOSIS — M6281 Muscle weakness (generalized): Secondary | ICD-10-CM | POA: Diagnosis not present

## 2020-09-04 DIAGNOSIS — R2689 Other abnormalities of gait and mobility: Secondary | ICD-10-CM | POA: Diagnosis not present

## 2020-09-06 DIAGNOSIS — M6281 Muscle weakness (generalized): Secondary | ICD-10-CM | POA: Diagnosis not present

## 2020-09-06 DIAGNOSIS — Z96653 Presence of artificial knee joint, bilateral: Secondary | ICD-10-CM | POA: Diagnosis not present

## 2020-09-06 DIAGNOSIS — R2689 Other abnormalities of gait and mobility: Secondary | ICD-10-CM | POA: Diagnosis not present

## 2020-09-06 DIAGNOSIS — Z9713 Presence of artificial right leg (complete) (partial): Secondary | ICD-10-CM | POA: Diagnosis not present

## 2020-09-07 DIAGNOSIS — M810 Age-related osteoporosis without current pathological fracture: Secondary | ICD-10-CM | POA: Diagnosis not present

## 2020-09-07 DIAGNOSIS — J45909 Unspecified asthma, uncomplicated: Secondary | ICD-10-CM | POA: Diagnosis not present

## 2020-09-07 DIAGNOSIS — E1142 Type 2 diabetes mellitus with diabetic polyneuropathy: Secondary | ICD-10-CM | POA: Diagnosis not present

## 2020-09-07 DIAGNOSIS — Z794 Long term (current) use of insulin: Secondary | ICD-10-CM | POA: Diagnosis not present

## 2020-09-07 DIAGNOSIS — D573 Sickle-cell trait: Secondary | ICD-10-CM | POA: Diagnosis not present

## 2020-09-07 DIAGNOSIS — E039 Hypothyroidism, unspecified: Secondary | ICD-10-CM | POA: Diagnosis not present

## 2020-09-07 DIAGNOSIS — I10 Essential (primary) hypertension: Secondary | ICD-10-CM | POA: Diagnosis not present

## 2020-09-07 DIAGNOSIS — E7800 Pure hypercholesterolemia, unspecified: Secondary | ICD-10-CM | POA: Diagnosis not present

## 2020-09-07 DIAGNOSIS — Z23 Encounter for immunization: Secondary | ICD-10-CM | POA: Diagnosis not present

## 2020-09-08 DIAGNOSIS — Z96653 Presence of artificial knee joint, bilateral: Secondary | ICD-10-CM | POA: Diagnosis not present

## 2020-09-08 DIAGNOSIS — R2689 Other abnormalities of gait and mobility: Secondary | ICD-10-CM | POA: Diagnosis not present

## 2020-09-08 DIAGNOSIS — Z9713 Presence of artificial right leg (complete) (partial): Secondary | ICD-10-CM | POA: Diagnosis not present

## 2020-09-08 DIAGNOSIS — M6281 Muscle weakness (generalized): Secondary | ICD-10-CM | POA: Diagnosis not present

## 2020-09-11 DIAGNOSIS — R2689 Other abnormalities of gait and mobility: Secondary | ICD-10-CM | POA: Diagnosis not present

## 2020-09-11 DIAGNOSIS — Z96653 Presence of artificial knee joint, bilateral: Secondary | ICD-10-CM | POA: Diagnosis not present

## 2020-09-11 DIAGNOSIS — Z9713 Presence of artificial right leg (complete) (partial): Secondary | ICD-10-CM | POA: Diagnosis not present

## 2020-09-11 DIAGNOSIS — M6281 Muscle weakness (generalized): Secondary | ICD-10-CM | POA: Diagnosis not present

## 2020-09-12 DIAGNOSIS — Z4789 Encounter for other orthopedic aftercare: Secondary | ICD-10-CM | POA: Diagnosis not present

## 2020-09-13 DIAGNOSIS — R2689 Other abnormalities of gait and mobility: Secondary | ICD-10-CM | POA: Diagnosis not present

## 2020-09-13 DIAGNOSIS — Z9713 Presence of artificial right leg (complete) (partial): Secondary | ICD-10-CM | POA: Diagnosis not present

## 2020-09-13 DIAGNOSIS — Z96653 Presence of artificial knee joint, bilateral: Secondary | ICD-10-CM | POA: Diagnosis not present

## 2020-09-13 DIAGNOSIS — M6281 Muscle weakness (generalized): Secondary | ICD-10-CM | POA: Diagnosis not present

## 2020-09-15 DIAGNOSIS — R2689 Other abnormalities of gait and mobility: Secondary | ICD-10-CM | POA: Diagnosis not present

## 2020-09-15 DIAGNOSIS — Z96653 Presence of artificial knee joint, bilateral: Secondary | ICD-10-CM | POA: Diagnosis not present

## 2020-09-15 DIAGNOSIS — M6281 Muscle weakness (generalized): Secondary | ICD-10-CM | POA: Diagnosis not present

## 2020-09-15 DIAGNOSIS — Z9713 Presence of artificial right leg (complete) (partial): Secondary | ICD-10-CM | POA: Diagnosis not present

## 2020-09-18 DIAGNOSIS — R2689 Other abnormalities of gait and mobility: Secondary | ICD-10-CM | POA: Diagnosis not present

## 2020-09-18 DIAGNOSIS — Z9713 Presence of artificial right leg (complete) (partial): Secondary | ICD-10-CM | POA: Diagnosis not present

## 2020-09-18 DIAGNOSIS — M6281 Muscle weakness (generalized): Secondary | ICD-10-CM | POA: Diagnosis not present

## 2020-09-18 DIAGNOSIS — Z96653 Presence of artificial knee joint, bilateral: Secondary | ICD-10-CM | POA: Diagnosis not present

## 2020-09-19 ENCOUNTER — Other Ambulatory Visit: Payer: Medicare PPO

## 2020-09-21 ENCOUNTER — Other Ambulatory Visit (INDEPENDENT_AMBULATORY_CARE_PROVIDER_SITE_OTHER): Payer: Medicare PPO

## 2020-09-21 ENCOUNTER — Other Ambulatory Visit: Payer: Self-pay

## 2020-09-21 ENCOUNTER — Encounter: Payer: Medicare PPO | Admitting: Endocrinology

## 2020-09-21 DIAGNOSIS — Z794 Long term (current) use of insulin: Secondary | ICD-10-CM | POA: Diagnosis not present

## 2020-09-21 DIAGNOSIS — E1165 Type 2 diabetes mellitus with hyperglycemia: Secondary | ICD-10-CM | POA: Diagnosis not present

## 2020-09-21 DIAGNOSIS — E782 Mixed hyperlipidemia: Secondary | ICD-10-CM | POA: Diagnosis not present

## 2020-09-21 NOTE — Progress Notes (Signed)
This encounter was created in error - please disregard.

## 2020-09-22 LAB — LIPID PANEL
Cholesterol: 235 mg/dL — ABNORMAL HIGH (ref 0–200)
HDL: 48.4 mg/dL (ref >39.00)
LDL Cholesterol: 149 mg/dL — ABNORMAL HIGH (ref 0–99)
NonHDL: 186.19
Total CHOL/HDL Ratio: 5
Triglycerides: 184 mg/dL — ABNORMAL HIGH (ref 0.0–149.0)
VLDL: 36.8 mg/dL (ref 0.0–40.0)

## 2020-09-22 LAB — COMPREHENSIVE METABOLIC PANEL
ALT: 9 U/L (ref 0–35)
AST: 15 U/L (ref 0–37)
Albumin: 4.2 g/dL (ref 3.5–5.2)
Alkaline Phosphatase: 81 U/L (ref 39–117)
BUN: 20 mg/dL (ref 6–23)
CO2: 29 mEq/L (ref 19–32)
Calcium: 9.8 mg/dL (ref 8.4–10.5)
Chloride: 102 mEq/L (ref 96–112)
Creatinine, Ser: 1.2 mg/dL (ref 0.40–1.20)
GFR: 42.67 mL/min — ABNORMAL LOW (ref >60.00)
Glucose, Bld: 146 mg/dL — ABNORMAL HIGH (ref 70–99)
Potassium: 4.1 mEq/L (ref 3.5–5.1)
Sodium: 142 mEq/L (ref 135–145)
Total Bilirubin: 0.6 mg/dL (ref 0.2–1.2)
Total Protein: 6.8 g/dL (ref 6.0–8.3)

## 2020-09-25 ENCOUNTER — Telehealth: Payer: Self-pay | Admitting: Nutrition

## 2020-09-25 NOTE — Telephone Encounter (Signed)
Paperwork from [place] for pump supplies.  Pt. Not on pump, per Dr. Ronnie Derby last note.  Message left on machine to call me to clarify this.

## 2020-09-27 ENCOUNTER — Ambulatory Visit: Payer: Medicare PPO | Admitting: Endocrinology

## 2020-09-27 DIAGNOSIS — M6281 Muscle weakness (generalized): Secondary | ICD-10-CM | POA: Diagnosis not present

## 2020-09-27 DIAGNOSIS — Z9713 Presence of artificial right leg (complete) (partial): Secondary | ICD-10-CM | POA: Diagnosis not present

## 2020-09-27 DIAGNOSIS — R2689 Other abnormalities of gait and mobility: Secondary | ICD-10-CM | POA: Diagnosis not present

## 2020-09-27 DIAGNOSIS — Z96653 Presence of artificial knee joint, bilateral: Secondary | ICD-10-CM | POA: Diagnosis not present

## 2020-09-29 DIAGNOSIS — R2689 Other abnormalities of gait and mobility: Secondary | ICD-10-CM | POA: Diagnosis not present

## 2020-09-29 DIAGNOSIS — M6281 Muscle weakness (generalized): Secondary | ICD-10-CM | POA: Diagnosis not present

## 2020-09-29 DIAGNOSIS — Z96653 Presence of artificial knee joint, bilateral: Secondary | ICD-10-CM | POA: Diagnosis not present

## 2020-09-29 DIAGNOSIS — Z9713 Presence of artificial right leg (complete) (partial): Secondary | ICD-10-CM | POA: Diagnosis not present

## 2020-10-03 ENCOUNTER — Ambulatory Visit
Admission: RE | Admit: 2020-10-03 | Discharge: 2020-10-03 | Disposition: A | Discharge status: Home / Self Care | Payer: Medicare PPO | Source: Ambulatory Visit | Attending: Internal Medicine | Admitting: Internal Medicine

## 2020-10-03 ENCOUNTER — Other Ambulatory Visit: Payer: Self-pay | Admitting: Internal Medicine

## 2020-10-03 DIAGNOSIS — M5432 Sciatica, left side: Secondary | ICD-10-CM | POA: Diagnosis not present

## 2020-10-03 DIAGNOSIS — M519 Unspecified thoracic, thoracolumbar and lumbosacral intervertebral disc disorder: Secondary | ICD-10-CM

## 2020-10-03 DIAGNOSIS — M545 Low back pain, unspecified: Secondary | ICD-10-CM | POA: Diagnosis not present

## 2020-10-05 ENCOUNTER — Other Ambulatory Visit: Payer: Self-pay

## 2020-10-05 ENCOUNTER — Ambulatory Visit (INDEPENDENT_AMBULATORY_CARE_PROVIDER_SITE_OTHER): Payer: Medicare PPO | Admitting: Endocrinology

## 2020-10-05 VITALS — BP 110/62 | HR 64 | Ht 63.0 in | Wt 183.2 lb

## 2020-10-05 DIAGNOSIS — E782 Mixed hyperlipidemia: Secondary | ICD-10-CM | POA: Diagnosis not present

## 2020-10-05 DIAGNOSIS — E1165 Type 2 diabetes mellitus with hyperglycemia: Secondary | ICD-10-CM | POA: Diagnosis not present

## 2020-10-05 DIAGNOSIS — E063 Autoimmune thyroiditis: Secondary | ICD-10-CM | POA: Diagnosis not present

## 2020-10-05 DIAGNOSIS — Z794 Long term (current) use of insulin: Secondary | ICD-10-CM

## 2020-10-05 LAB — POCT GLUCOSE (DEVICE FOR HOME USE): POC Glucose: 121 mg/dl — AB (ref 70–99)

## 2020-10-05 MED ORDER — REPATHA PUSHTRONEX SYSTEM 420 MG/3.5ML ~~LOC~~ SOCT: 420.0000 mg | SUBCUTANEOUS | 2 refills | Status: DC

## 2020-10-05 MED ORDER — REPATHA PUSHTRONEX SYSTEM 420 MG/3.5ML ~~LOC~~ SOCT: 420.0000 mg | SUBCUTANEOUS | 3 refills | Status: DC

## 2020-10-05 MED ORDER — ACCU-CHEK AVIVA PLUS VI STRP: ORAL_STRIP | 3 refills | Status: DC

## 2020-10-05 NOTE — Patient Instructions (Addendum)
Must take Novolog before eating if able to  May up to 12 Novolog  at supper for more carbs

## 2020-10-05 NOTE — Progress Notes (Signed)
Patient ID: [name], female   DOB: 05/27/1940, 80 y.o.   MRN: [id number]      Reason for Appointment: Endocrinology follow-up  History of Present Illness    PROBLEM 1: Type 2 diabetes mellitus, date of diagnosis: 1998.   Prior history: She had previously been treated with Byetta, Glumetza, Amaryl, Victoza and  Onglyza However because of inadequate control and intolerance to drugs she was finally given pre-meal insulin along with Amaryl, Glumetza eventually stopped because of side effects and also Lantus added  Recent history:  The insulin regimen is: Novolog 6 at breakfast and 10 lunch, 10 acs Tresiba 22 units daily  Oral agents: Farxiga 5 mg daily  Her A1c is 6.4 done by PCP in 5/22 6.9  A1c previous range 5.9--7.4  Current blood sugar patterns and problems with management: She has been able to start the freestyle libre sensor and is using a reader but her readings on the [place] today appear to be falsely low Her lab glucose was 146 but no concomitant readings available from the sensor She is still taking Farxiga 5 mg at breakfast time and did not tolerate 10 mg apparently  She is not adjusting her NovoLog much based on what she is eating She will not always take her suppertime NovoLog before eating since she is eating in the dining room and may take it half an hour later With this and otherwise she may periodically have high readings after dinner Even though she has some readings in the 60s on the sensor she does not feel symptomatic and does not have any strips to check her sugars with the Accu-Chek Her breakfast is usually oatmeal and bacon if she has any meal in the morning   Side effects from diabetes medications: Victoza caused nausea.  Metformin  Interpretation of her freestyle libre version 2 for the last 2 weeks as follows  Her freestyle Elenor Legato appears to be inaccurate with her glucose being 86 compared to 121 in the office with the fingerstick She  is using a reading currently  Currently the blood sugar patterns show fairly good readings most of the time with variable rise in blood sugar after dinner otherwise generally good control after meals and overnight Freestyle libre indicates low normal readings overnight and around dinnertime with blood sugars as low as 56 Overnight blood sugars are averaging about 160 at midnight and then gradually declining until about 9 AM Postprandial readings are excellent after breakfast, only mildly increased at lunch at times and variably high at dinnertime Blood sugar data is somewhat incomplete recently in the evenings after supper which is around 5 PM Sometimes blood sugars may rise more significantly later in the evening after 8 PM  Statistics:  CGM use % of time 68  2-week average/GV 134  Time in range    87    %  % Time Above 180 13  % Time above 250   % Time Below 70 0     PRE-MEAL Fasting Lunch Dinner Bedtime Overall  Glucose range:       Averages: 112 130 116  134   POST-MEAL PC Breakfast PC Lunch PC Dinner  Glucose range:     Averages: 120 138 164     Blood sugar for the last 2 weeks 62-174 range AVERAGE 944    Certified Diabetes Educator visit: Most recent: 7/13.  Dietician visit: Most recent: 3/13.   Wt Readings from Last 3 Encounters:  10/05/20 183 lb 3.2  oz (83.1 kg)  09/21/20 181 lb 6.4 oz (82.3 kg)  07/05/20 181 lb (16.1 kg)   Complications: Neuropathy  LABS:  Lab Results  Component Value Date   HGBA1C 6.9 (H) 06/22/2020   HGBA1C 6.8 (H) 06/12/2020   HGBA1C 7.7 (H) 04/07/2020   Lab Results  Component Value Date   MICROALBUR 2.8 (H) 04/07/2020   LDLCALC 149 (H) 09/21/2020   CREATININE 1.20 09/21/2020   Lab Results  Component Value Date   FRUCTOSAMINE 283 06/09/2018   FRUCTOSAMINE 296 (H) 05/16/2017   FRUCTOSAMINE 322 (H) 10/10/2016    Multiple other issues are addressed in review of systems  Office Visit on 10/05/2020  Component Date Value Ref  Range Status   POC Glucose 10/05/2020 121 (A) 70 - 99 mg/dl Final      Allergies as of 10/05/2020       Reactions   Cymbalta [duloxetine Hcl] Diarrhea, Other (See Comments)   Dizziness, headache, irritability   Amlodipine Besylate Swelling   Baclofen    jerks    Betadine [povidone Iodine] Swelling, Other (See Comments)   SWELLING REACTION DESCRIPTION/SEVERITY UNSPECIFIED  Reaction to betadine on eye lid and it swelled up   Erythromycin Base Other (See Comments)   unknown   Oxycodone Hcl    Percocet [oxycodone-acetaminophen] Other (See Comments)   Confusion change personality   Povidone-iodine Swelling   Soap Other (See Comments)   unknown   Statins Other (See Comments)   Muscle cramps   Adhesive [tape] Rash   Lipitor [atorvastatin] Rash        Medication List        Accurate as of October 05, 2020  2:46 PM. If you have any questions, ask your nurse or doctor.          Accu-Chek Aviva Plus test strip Generic drug: glucose blood TEST BLOOD SUGAR FOUR TIMES DAILY AS DIRECTED   Accu-Chek Aviva Plus w/Device Kit Use Accu Chek Aviva as instructed to check blood sugar 4 times daily.   acetaminophen 500 MG tablet Commonly known as: TYLENOL Take 1,000 mg by mouth every 8 (eight) hours as needed for mild pain or moderate pain.   aspirin EC 325 MG tablet TAKE 1 TABLET BY MOUTH 2 TIMES DAILY FOR 19 DAYS THEN DISCONTINUE   Bystolic 5 MG tablet Generic drug: nebivolol Take 5 mg by mouth at bedtime. Nebivolol   Cal-Mag-Zinc-D Tabs Take 1 tablet by mouth daily. 330 mg /130 mg/ 5 mg /5 mg   cetirizine 10 MG tablet Commonly known as: ZYRTEC Take 1 tablet (10 mg total) by mouth daily as needed for allergies. What changed: when to take this   dapagliflozin propanediol 5 MG Tabs tablet Commonly known as: Farxiga TAKE 1 TABLET EVERY DAY What changed:  how much to take how to take this when to take this additional instructions   Droplet Pen Needles 32G X 4 MM  Misc Generic drug: Insulin Pen Needle INJECT  INSULIN FOUR TIMES DAILY   fluticasone 50 MCG/ACT nasal spray Commonly known as: FLONASE Place 1 spray into both nostrils daily as needed for allergies. What changed:  how much to take when to take this   gabapentin 600 MG tablet Commonly known as: NEURONTIN TAKE 1 TABLET(600 MG) BY MOUTH THREE TIMES DAILY What changed: See the new instructions.   HYDROcodone-acetaminophen 5-325 MG tablet Commonly known as: NORCO/VICODIN TAKE 1 TO 2 TABLETS BY MOUTH EVERY 6 HOURS AS NEEDED FOR MODERATE PAIN OR SEVERE PAIN  irbesartan-hydrochlorothiazide 300-12.5 MG tablet Commonly known as: AVALIDE Take 1 tablet by mouth daily.   ketoconazole 2 % cream Commonly known as: NIZORAL Apply 1 application topically daily as needed (Itching).   levothyroxine 50 MCG tablet Commonly known as: SYNTHROID TAKE 1 TABLET(50 MCG) BY MOUTH DAILY   methocarbamol 500 MG tablet Commonly known as: ROBAXIN TAKE 1 TABLET BY MOUTH EVERY 6 HOURS AS NEEDED FOR MUSCLE SPASMS   NovoLOG FlexPen 100 UNIT/ML FlexPen Generic drug: insulin aspart INJECT 6 UNITS BEFORE BREAKFAST, 9 UNITS AT LUNCH, 8-10 UNITS AT DINNER, 3-4 UNITS AS NEEDED FOR LARGE CARBS OR ICE CREAM What changed:  how much to take how to take this when to take this additional instructions   Repatha Pushtronex System 420 MG/3.5ML Soct Generic drug: Evolocumab with Infusor Inject 420 mg into the skin every 30 (thirty) days.   Symbicort 80-4.5 MCG/ACT inhaler Generic drug: budesonide-formoterol Inhale 2 puffs into the lungs 2 (two) times daily as needed (shortness of breath). What changed: when to take this   traMADol 50 MG tablet Commonly known as: ULTRAM TAKE 1 TO 2 TABLETS BY MOUTH EVERY 6 HOURS AS NEEDED FOR MODERATE PAIN   Tresiba FlexTouch 100 UNIT/ML FlexTouch Pen Generic drug: insulin degludec Inject 24 Units into the skin daily.        Allergies:  Allergies  Allergen Reactions    Cymbalta [Duloxetine Hcl] Diarrhea and Other (See Comments)    Dizziness, headache, irritability   Amlodipine Besylate Swelling   Baclofen     jerks    Betadine [Povidone Iodine] Swelling and Other (See Comments)    SWELLING REACTION DESCRIPTION/SEVERITY UNSPECIFIED  Reaction to betadine on eye lid and it swelled up   Erythromycin Base Other (See Comments)    unknown   Oxycodone Hcl    Percocet [Oxycodone-Acetaminophen] Other (See Comments)    Confusion change personality   Povidone-Iodine Swelling   Soap Other (See Comments)    unknown   Statins Other (See Comments)    Muscle cramps   Adhesive [Tape] Rash   Lipitor [Atorvastatin] Rash    Past Medical History:  Diagnosis Date   Acute blood loss anemia    Acute encephalopathy 07/15/2016   AKI (acute kidney injury) ([place])    Anemia    has sickle cell trait   Anxiety    Arthritis    Asthma    has used inhaler in past for asthmatic bronchitis, last time- early 2012   Bilateral primary osteoarthritis of knee 97/09/7339   Complication of anesthesia    wakes up shaking   Diabetes mellitus    Diabetes mellitus with neuropathy ([place]) 10/29/2012   Diverticulitis    Dyslipidemia    Dyspnea    with exertion   Encephalopathy 06/2016   due to medications after surgery   Fibromyalgia    GERD (gastroesophageal reflux disease)    occas. use of  Prilosec   Heart murmur    sees Dr. Montez Morita, last seen- early 2012   Herniated nucleus pulposus, L2-3 06/25/2016   History of back surgery    Hypertension    02/2010- stress test /w PCP   Hypothyroidism    Loose bowel movements 12/2016   Lumbar radiculopathy 06/27/2016   Lumbar stenosis with neurogenic claudication 03/17/2017   Memory disorder 02/16/2016   Myoclonic jerking    Neuromuscular disorder ([facility])    lumbar radiculopathy, lumbago   Nocturnal leg cramps 09/27/2014   Osteoporosis 03/10/2014   Pneumonia    Sickle cell trait (  [place])    Sleep apnea    borderline sleep apnea, states she  no longer uses, early 2012- stopped using    Spinal stenosis of lumbar region with radiculopathy 02/28/2014   Spondylolisthesis of lumbar region 03/19/2011   Type II or unspecified type diabetes mellitus without mention of complication, uncontrolled 07/08/2013    Past Surgical History:  Procedure Laterality Date   ABDOMINAL HYSTERECTOMY     adb.cyst     ovarian cyst   BACK SURGERY     2012, 2015 (3 total)   BACK SURGERY  2018   02/24/2017   COLONOSCOPY     EYE SURGERY     macular degeneration treatment - injections   FEMUR IM NAIL Left 06/13/2018   Procedure: INTRAMEDULLARY (IM) NAIL FEMORAL;  Surgeon: [name], MD;  Location: [facility];  Service: Orthopedics;  Laterality: Left;   OVARIAN CYST SURGERY     POSTERIOR LUMBAR FUSION 4 LEVEL N/A 03/17/2017   Procedure: Decompression of Lumbar One-Two with Thoracic Ten to Lumbar Two Fusion;  Surgeon: Kristeen Miss, MD;  Location: [facility];  Service: Neurosurgery;  Laterality: N/A;  Decompression of L1-2 with T10 to L2 Fusion   TOTAL KNEE ARTHROPLASTY Right 08/13/2019   Procedure: RIGHT TOTAL KNEE ARTHROPLASTY;  Surgeon: [name], MD;  Location: [facility];  Service: Orthopedics;  Laterality: Right;   TOTAL KNEE REVISION Right 07/05/2020   Procedure: TOTAL KNEE REVISION;  Surgeon: Gaynelle Arabian, MD;  Location: [facility];  Service: Orthopedics;  Laterality: Right;  162mn    Family History  Problem Relation Age of Onset   Ovarian cancer Mother    Cancer - Prostate Father    Breast cancer Paternal Aunt    Multiple myeloma Paternal Aunt    Anesthesia problems Neg Hx    Hypotension Neg Hx    Malignant hyperthermia Neg Hx    Pseudochol deficiency Neg Hx     Social History:  reports that she quit smoking about 17 years ago. Her smoking use included cigarettes. She has a 3.00 pack-year smoking history. She has never used smokeless tobacco. She reports current alcohol use. She reports that she does not use drugs.  ROS     NEUROPATHY: She has had tingling in feet and legs especially at night. She is taking gabapentin   Hyperlipidemia:   The lipid abnormality consists of elevated LDL persistently Did not tolerate Crestor or lovastatin because of muscle cramps  She stopped Zetia presumably because she was having muscle cramps  She was started on Repatha in 2021 Previously when taking this regularly she had good results but has not reordered her medication as she did not not have a refill information  She was having some pain with the injection and is using the monthly pump device  LDL results as follows:  Lab Results  Component Value Date   CHOL 235 (H) 09/21/2020   CHOL 151 04/07/2020   CHOL 226 (H) 12/28/2019   Lab Results  Component Value Date   HDL 48.40 09/21/2020   HDL 56.10 04/07/2020   HDL 55.70 12/28/2019   Lab Results  Component Value Date   LDLCALC 149 (H) 09/21/2020   LDLCALC 77 04/07/2020   LDLCALC 153 (H) 12/28/2019   Lab Results  Component Value Date   TRIG 184.0 (H) 09/21/2020   TRIG 91.0 04/07/2020   TRIG 84.0 12/28/2019   Lab Results  Component Value Date   CHOLHDL 5 09/21/2020   CHOLHDL 3 04/07/2020   CHOLHDL  4 12/28/2019   Lab Results  Component Value Date   LDLDIRECT 166.0 02/09/2019   LDLDIRECT 115.0 12/18/2016   LDLDIRECT 134.0 10/10/2016      HYPERTENSION:  Has been present for several years.  she is on Avalide 300 mg, previously also on Bystolic from PCP along with amlodipine Blood pressure readings are variable  BP Readings from Last 3 Encounters:  10/05/20 110/62  09/21/20 126/70  07/07/20 119/62   . Has a history of leg and muscle cramps and takes magnesium supplements twice a day  No recent hypokalemia  Lab Results  Component Value Date   K 4.1 09/21/2020      Hypothyroidism  She had a relatively high TSH as of 4/17 and was empirically given Synthroid 25 g Subsequently has been on 50 mcg which she takes regularly   TSH  history as follows, last TSH was 2.35 done at PCP office    Lab Results  Component Value Date   TSH 1.56 04/07/2020   TSH 4.76 (H) 12/28/2019   TSH 2.68 09/14/2019   FREET4 1.06 09/14/2019   FREET4 0.88 02/09/2019   FREET4 0.97 10/14/2018         Examination:   BP 110/62   Pulse 64   Ht '5\' 3"'  (1.6 m)   Wt 183 lb 3.2 oz (83.1 kg)   LMP  (LMP Unknown)   SpO2 92%   BMI 32.45 kg/m   Body mass index is 32.45 kg/m.     Assesment/Plan:   1. DIABETES type 2 on insulin  See history of present illness for detailed discussion of management, blood sugar patterns and problems identified  She is on basal bolus insulin and Farxiga 5 mg daily  A1c is much better at 6.4  Overall with better diet her blood sugars are controlled and she is also benefiting from [place] As above her blood sugars may be higher after dinner either from late doses of NovoLog or more carbohydrate at times   her freestyle libre readings appear to be falsely low by as much as 40 mg  She will need test trips to compare with her freestyle libre No need to change her insulin but she needs to go up on the dose at suppertime for larger meals To take her NovoLog pen with her to the dining room so she can inject right before eating  3. Hypertension: Blood pressure is controlled, followed by PCP now   4.   Hypothyroidism: She is taking 50 mcg levothyroxine with gain normal TSH   LIPIDS: She has not continued to take Repatha, previously had excellent results and now LDL is higher from her not reordering the medication This will be sent again    Patient Instructions  Must take Novolog before eating if able to  May up to 12 Novolog  at supper for more carbs       [name] 10/05/2020, 2:46 PM    Note: This office note was prepared with Dragon voice recognition system technology. Any transcriptional errors that result from this process are unintentional.

## 2020-10-10 ENCOUNTER — Encounter: Payer: Self-pay | Admitting: Physical Medicine and Rehabilitation

## 2020-10-10 NOTE — Progress Notes (Signed)
[name] - 80 y.o. female MRN [id number]  Date of birth: 1940-08-29  Office Visit Note: Visit Date: 04/25/2020 PCP: Wenda Low, MD Referred by: Wenda Low, MD  Subjective: Chief Complaint  Patient presents with   Neck - Pain   Right Shoulder - Pain   Left Shoulder - Pain   Left Arm - Pain   Right Arm - Pain   HPI: [name] is a 80 y.o. female who comes in today For new patient evaluation and management of chronic worsening severe neck pain with bilateral shoulder and arm pain.  She describes years of neck pain with referral pain in the shoulders and arms.  She reports 9 out of 10 pain in general which is intermittent sharp stabbing and aching.  Its worse with activities and bending.  She feels like when she turns her neck she can feel some popping and shooting pain into her arms.  She also feels pain getting up from a seated position using her arms.  She has had orthopedic evaluation for her shoulders and does currently see Dr. Ninfa Linden in our office for her knee.  She is been undergoing serious issues with her knee which probably needs to be revised from knee replacement.  She has had a long history of low back pain as well.  She has been seeing Dr. Kristeen Miss for her cervical spine and MRI of the cervical spine was performed by him and this is reviewed with her today with spine models and imaging.  She has tried medication and therapy but has not had epidural injection.  Her case is complicated by anxiety and fibromyalgia and intolerance to multiple medications typically used for pain.  She also has some level of iodine allergy as well.  He does feel weak at times but has not noted focal weakness.  She has had no balance difficulties in general.  No red flag complaints.  Review of Systems  Musculoskeletal:  Positive for back pain, joint pain and neck pain.  All other systems reviewed and are negative. Otherwise per HPI.  Assessment & Plan: Visit Diagnoses:     ICD-10-CM   1. Cervical radiculopathy  M54.12     2. Cervical spondylosis without myelopathy  M47.812     3. Spinal stenosis of cervical region  M48.02     4. Cervicalgia  M54.2     5. Fibromyalgia  M79.7     6. Chronic pain syndrome  G89.4        Plan: Findings:  Chronic severe recalcitrant neck pain with upper shoulder pain bilaterally and arm pain bilaterally.  No specific dermatomal pattern of paresthesia although some paresthesias in general.  Exam consistent with some level of myofascial pain cervical arthritis as well as radiculitis radiculopathy.  Equivocally positive Spurling's.  At this point with failure of all manner of conservative care I do recommend epidural injection.  She has had contrast dye without difficulty even though she does have some difficulty with Betadine.  We will use a very small amount of contrast in the injection.  Depending on relief could look at further injection therapy.  Could look at updated MRI.   Meds & Orders: No orders of the defined types were placed in this encounter.  No orders of the defined types were placed in this encounter.   Follow-up: Return for C7-T1 interlaminar epidural steroid injection.   Procedures: No procedures performed      Clinical History: MRI CERVICAL SPINE WITHOUT CONTRAST  TECHNIQUE: Multiplanar, multisequence MR imaging of the cervical spine was performed. No intravenous contrast was administered.   COMPARISON:  MRI dated 05/25/2007   FINDINGS: Alignment: Physiologic.   Vertebrae: No fracture, evidence of discitis, or bone lesion.   Cord: Normal signal and morphology.   Posterior Fossa, vertebral arteries, paraspinal tissues: 4 cm nodule in the left lobe of the thyroid gland. This has increased from 3.4 cm in 2009. Bilateral thyroid nodules were biopsied at that time. Paraspinal soft tissues otherwise appear normal.   Disc levels:   C2-3: Negative.   C3-4: Tiny osteophytes to the left of midline  with no neural impingement. Otherwise negative.   C4-5: Normal disc. Severe left facet arthritis, slightly progressed since the prior MRI. No foraminal stenosis.   C5-6: Broad-based soft disc protrusion, increased from a disc bulge on the prior study. Slight flattening of the ventral aspect of the spinal cord without myelopathy. Moderate right facet arthritis, new since the prior study. Moderate right foraminal stenosis   C6-7: Progressive degenerative disc disease since 2009 with a small broad-based disc bulge with a small protrusion and accompanying osteophytes extending into the right lateral recess and proximal right neural foramen with right foraminal stenosis, new since the prior study. Slight bilateral facet arthritis.   C7-T1: Negative.   IMPRESSION: 1. Progressive degenerative disc disease at C5-6 and C6-7 with right foraminal stenosis at C5-6 and C6-7 which could affect the right C6 and C7 nerves respectively. 2. Progressive facet arthritis at C4-5 on the left and at C5-6 on the right. 3. No findings to suggest cervical spinal infection. 4. 4 cm nodule in the left lobe of the thyroid gland. This has increased from 3.4 cm in 2009. This has been previously evaluated with biopsy in 2009.     Electronically Signed   By: Lorriane Shire M.D.   On: 01/05/2019 13:38   She reports that she quit smoking about 17 years ago. Her smoking use included cigarettes. She has a 3.00 pack-year smoking history. She has never used smokeless tobacco.  Recent Labs    04/07/20 1157 06/12/20 1130 06/22/20 1154  HGBA1C 7.7* 6.8* 6.9*    Objective:  VS:  HT:    WT:   BMI:     BP:(!) 147/77  HR:72bpm  TEMP: ( )  RESP:  Physical Exam Vitals and nursing note reviewed.  Constitutional:      General: She is not in acute distress.    Appearance: Normal appearance. She is not ill-appearing.  HENT:     Head: Normocephalic and atraumatic.     Right Ear: External ear normal.     Left  Ear: External ear normal.  Eyes:     Extraocular Movements: Extraocular movements intact.  Cardiovascular:     Rate and Rhythm: Normal rate.     Pulses: Normal pulses.  Musculoskeletal:     Cervical back: Tenderness present. No rigidity.     Right lower leg: No edema.     Left lower leg: No edema.     Comments: Patient has good strength in the upper extremities including 5 out of 5 strength in wrist extension long finger flexion and APB.  There is no atrophy of the hands intrinsically.  There is a negative Hoffmann's test.  Equivocally positive Spurling's bilaterally.  Multiple tender points and trigger points in the trapezius bilaterally.  Does have shoulder impingement bilaterally.   Lymphadenopathy:     Cervical: No cervical adenopathy.  Skin:    Findings: No  erythema, lesion or rash.  Neurological:     General: No focal deficit present.     Mental Status: She is alert and oriented to person, place, and time.     Sensory: No sensory deficit.     Motor: No weakness or abnormal muscle tone.     Coordination: Coordination normal.  Psychiatric:        Mood and Affect: Mood normal.        Behavior: Behavior normal.    Ortho Exam  Imaging: No results found.  Past Medical/Family/Surgical/Social History: Medications & Allergies reviewed per EMR, new medications updated. Patient Active Problem List   Diagnosis Date Noted   Failed total knee arthroplasty ([place]) 07/05/2020   Failed total right knee replacement ([place]) 07/05/2020   Diverticular disease of colon 05/18/2020   Gastroesophageal reflux disease 05/18/2020   Irritable bowel syndrome with diarrhea 05/18/2020   Status post total right knee replacement 08/13/2019   Unilateral primary osteoarthritis, right knee 05/24/2019   Fracture, proximal femur ([place]) 06/12/2018   Femur fracture, left ([place]) 06/12/2018   Acute blood loss as cause of postoperative anemia 03/30/2017   Diabetic polyneuropathy associated with secondary diabetes  mellitus ([place]) 03/30/2017   Asthma 03/30/2017   Dyslipidemia 03/26/2017   Lumbar stenosis with neurogenic claudication 03/17/2017   Spinal stenosis of lumbar region 03/03/2017   Myoclonic jerking    Nausea    Acute encephalopathy 07/15/2016   Lumbar radiculopathy 06/27/2016   Hypothyroidism 06/26/2016   Labile blood glucose    Surgery, elective    Post-operative pain    Sickle cell trait ([place])    Acute blood loss anemia    History of back surgery    AKI (acute kidney injury) ([place])    Herniated nucleus pulposus, L2-3 06/25/2016   Bilateral primary osteoarthritis of knee 03/26/2016   Osteoarthritis, hand 03/26/2016   Other insomnia 03/26/2016   Fibromyalgia 09/27/2014   Nocturnal leg cramps 09/27/2014   Body mass index (BMI) of 30.0-30.9 in adult 08/04/2014   Neuropathy 03/10/2014   Allergic rhinitis 03/10/2014   Osteoporosis 03/10/2014   Spinal stenosis of lumbar region with radiculopathy 02/28/2014   Type 2 diabetes mellitus with complication, with long-term current use of insulin ([place]) 07/08/2013   Hypokalemia 11/02/2012   Essential hypertension, benign 11/02/2012   Diabetes mellitus with neuropathy ([place]) 10/29/2012   Hyperlipidemia 10/29/2012   Spondylolisthesis of lumbar region 03/19/2011   Lumbar radicular pain 03/19/2011   Past Medical History:  Diagnosis Date   Acute blood loss anemia    Acute encephalopathy 07/15/2016   AKI (acute kidney injury) ([place])    Anemia    has sickle cell trait   Anxiety    Arthritis    Asthma    has used inhaler in past for asthmatic bronchitis, last time- early 2012   Bilateral primary osteoarthritis of knee 33/11/3289   Complication of anesthesia    wakes up shaking   Diabetes mellitus    Diabetes mellitus with neuropathy ([place]) 10/29/2012   Diverticulitis    Dyslipidemia    Dyspnea    with exertion   Encephalopathy 06/2016   due to medications after surgery   Fibromyalgia    GERD (gastroesophageal reflux disease)    occas.  use of  Prilosec   Heart murmur    sees Dr. Montez Morita, last seen- early 2012   Herniated nucleus pulposus, L2-3 06/25/2016   History of back surgery    Hypertension    02/2010- stress test /w PCP  Hypothyroidism    Loose bowel movements 12/2016   Lumbar radiculopathy 06/27/2016   Lumbar stenosis with neurogenic claudication 03/17/2017   Memory disorder 02/16/2016   Myoclonic jerking    Neuromuscular disorder ([facility])    lumbar radiculopathy, lumbago   Nocturnal leg cramps 09/27/2014   Osteoporosis 03/10/2014   Pneumonia    Sickle cell trait ([facility])    Sleep apnea    borderline sleep apnea, states she no longer uses, early 2012- stopped using    Spinal stenosis of lumbar region with radiculopathy 02/28/2014   Spondylolisthesis of lumbar region 03/19/2011   Type II or unspecified type diabetes mellitus without mention of complication, uncontrolled 07/08/2013   Family History  Problem Relation Age of Onset   Ovarian cancer Mother    Cancer - Prostate Father    Breast cancer Paternal Aunt    Multiple myeloma Paternal Aunt    Anesthesia problems Neg Hx    Hypotension Neg Hx    Malignant hyperthermia Neg Hx    Pseudochol deficiency Neg Hx    Past Surgical History:  Procedure Laterality Date   ABDOMINAL HYSTERECTOMY     adb.cyst     ovarian cyst   BACK SURGERY     2012, 2015 (3 total)   BACK SURGERY  2018   02/24/2017   COLONOSCOPY     EYE SURGERY     macular degeneration treatment - injections   FEMUR IM NAIL Left 06/13/2018   Procedure: INTRAMEDULLARY (IM) NAIL FEMORAL;  Surgeon: [name], MD;  Location: [facility];  Service: Orthopedics;  Laterality: Left;   OVARIAN CYST SURGERY     POSTERIOR LUMBAR FUSION 4 LEVEL N/A 03/17/2017   Procedure: Decompression of Lumbar One-Two with Thoracic Ten to Lumbar Two Fusion;  Surgeon: Kristeen Miss, MD;  Location: [facility];  Service: Neurosurgery;  Laterality: N/A;  Decompression of L1-2 with T10 to L2 Fusion   TOTAL KNEE ARTHROPLASTY  Right 08/13/2019   Procedure: RIGHT TOTAL KNEE ARTHROPLASTY;  Surgeon: [name], MD;  Location: [facility];  Service: Orthopedics;  Laterality: Right;   TOTAL KNEE REVISION Right 07/05/2020   Procedure: TOTAL KNEE REVISION;  Surgeon: Gaynelle Arabian, MD;  Location: [facility];  Service: Orthopedics;  Laterality: Right;  167mn   Social History   Occupational History   Occupation: Retired  Tobacco Use   Smoking status: Former    Packs/day: 0.10    Years: 30.00    Pack years: 3.00    Types: Cigarettes    Quit date: 2005    Years since quitting: 17.4   Smokeless tobacco: Never  Vaping Use   Vaping Use: Never used  Substance and Sexual Activity   Alcohol use: Yes    Comment: wine /w dinner on occas.   Drug use: Never   Sexual activity: Never

## 2020-10-12 DIAGNOSIS — R2689 Other abnormalities of gait and mobility: Secondary | ICD-10-CM | POA: Diagnosis not present

## 2020-10-12 DIAGNOSIS — Z96653 Presence of artificial knee joint, bilateral: Secondary | ICD-10-CM | POA: Diagnosis not present

## 2020-10-12 DIAGNOSIS — Z9713 Presence of artificial right leg (complete) (partial): Secondary | ICD-10-CM | POA: Diagnosis not present

## 2020-10-12 DIAGNOSIS — M6281 Muscle weakness (generalized): Secondary | ICD-10-CM | POA: Diagnosis not present

## 2020-10-16 DIAGNOSIS — M6281 Muscle weakness (generalized): Secondary | ICD-10-CM | POA: Diagnosis not present

## 2020-10-16 DIAGNOSIS — R2689 Other abnormalities of gait and mobility: Secondary | ICD-10-CM | POA: Diagnosis not present

## 2020-10-16 DIAGNOSIS — Z96653 Presence of artificial knee joint, bilateral: Secondary | ICD-10-CM | POA: Diagnosis not present

## 2020-10-16 DIAGNOSIS — Z9713 Presence of artificial right leg (complete) (partial): Secondary | ICD-10-CM | POA: Diagnosis not present

## 2020-10-18 DIAGNOSIS — R2689 Other abnormalities of gait and mobility: Secondary | ICD-10-CM | POA: Diagnosis not present

## 2020-10-18 DIAGNOSIS — M6281 Muscle weakness (generalized): Secondary | ICD-10-CM | POA: Diagnosis not present

## 2020-10-18 DIAGNOSIS — Z9713 Presence of artificial right leg (complete) (partial): Secondary | ICD-10-CM | POA: Diagnosis not present

## 2020-10-18 DIAGNOSIS — Z96653 Presence of artificial knee joint, bilateral: Secondary | ICD-10-CM | POA: Diagnosis not present

## 2020-10-19 DIAGNOSIS — K5904 Chronic idiopathic constipation: Secondary | ICD-10-CM | POA: Diagnosis not present

## 2020-10-19 DIAGNOSIS — Z86010 Personal history of colonic polyps: Secondary | ICD-10-CM | POA: Diagnosis not present

## 2020-10-19 DIAGNOSIS — K219 Gastro-esophageal reflux disease without esophagitis: Secondary | ICD-10-CM | POA: Diagnosis not present

## 2020-10-19 DIAGNOSIS — K5730 Diverticulosis of large intestine without perforation or abscess without bleeding: Secondary | ICD-10-CM | POA: Diagnosis not present

## 2020-10-19 DIAGNOSIS — R140 Abdominal distension (gaseous): Secondary | ICD-10-CM | POA: Diagnosis not present

## 2020-10-19 DIAGNOSIS — E669 Obesity, unspecified: Secondary | ICD-10-CM | POA: Diagnosis not present

## 2020-10-24 DIAGNOSIS — E1165 Type 2 diabetes mellitus with hyperglycemia: Secondary | ICD-10-CM | POA: Diagnosis not present

## 2020-10-30 ENCOUNTER — Other Ambulatory Visit (HOSPITAL_COMMUNITY): Payer: Self-pay | Admitting: Gastroenterology

## 2020-10-30 DIAGNOSIS — M6281 Muscle weakness (generalized): Secondary | ICD-10-CM | POA: Diagnosis not present

## 2020-10-30 DIAGNOSIS — R1011 Right upper quadrant pain: Secondary | ICD-10-CM

## 2020-10-30 DIAGNOSIS — R2689 Other abnormalities of gait and mobility: Secondary | ICD-10-CM | POA: Diagnosis not present

## 2020-10-30 DIAGNOSIS — Z96653 Presence of artificial knee joint, bilateral: Secondary | ICD-10-CM | POA: Diagnosis not present

## 2020-10-30 DIAGNOSIS — Z9713 Presence of artificial right leg (complete) (partial): Secondary | ICD-10-CM | POA: Diagnosis not present

## 2020-11-01 DIAGNOSIS — Z9713 Presence of artificial right leg (complete) (partial): Secondary | ICD-10-CM | POA: Diagnosis not present

## 2020-11-01 DIAGNOSIS — Z96653 Presence of artificial knee joint, bilateral: Secondary | ICD-10-CM | POA: Diagnosis not present

## 2020-11-01 DIAGNOSIS — M6281 Muscle weakness (generalized): Secondary | ICD-10-CM | POA: Diagnosis not present

## 2020-11-01 DIAGNOSIS — R2689 Other abnormalities of gait and mobility: Secondary | ICD-10-CM | POA: Diagnosis not present

## 2020-11-03 ENCOUNTER — Ambulatory Visit (HOSPITAL_COMMUNITY): Payer: Medicare PPO

## 2020-11-06 DIAGNOSIS — R2689 Other abnormalities of gait and mobility: Secondary | ICD-10-CM | POA: Diagnosis not present

## 2020-11-06 DIAGNOSIS — M6281 Muscle weakness (generalized): Secondary | ICD-10-CM | POA: Diagnosis not present

## 2020-11-06 DIAGNOSIS — Z96653 Presence of artificial knee joint, bilateral: Secondary | ICD-10-CM | POA: Diagnosis not present

## 2020-11-06 DIAGNOSIS — Z9713 Presence of artificial right leg (complete) (partial): Secondary | ICD-10-CM | POA: Diagnosis not present

## 2020-11-08 ENCOUNTER — Other Ambulatory Visit: Payer: Self-pay

## 2020-11-08 ENCOUNTER — Ambulatory Visit (HOSPITAL_COMMUNITY)
Admission: RE | Admit: 2020-11-08 | Discharge: 2020-11-08 | Disposition: A | Discharge status: Home / Self Care | Payer: Medicare PPO | Source: Ambulatory Visit | Attending: Gastroenterology | Admitting: Gastroenterology

## 2020-11-08 DIAGNOSIS — R1011 Right upper quadrant pain: Secondary | ICD-10-CM | POA: Insufficient documentation

## 2020-11-08 DIAGNOSIS — R109 Unspecified abdominal pain: Secondary | ICD-10-CM | POA: Diagnosis not present

## 2020-11-08 LAB — POCT I-STAT CREATININE: Creatinine, Ser: 1 mg/dL (ref 0.44–1.00)

## 2020-11-08 MED ORDER — IOHEXOL 350 MG/ML SOLN
80.0000 mL | Freq: Once | INTRAVENOUS | Status: AC | PRN
  Administered 2020-11-08: 80 mL via INTRAVENOUS

## 2020-11-10 DIAGNOSIS — M6281 Muscle weakness (generalized): Secondary | ICD-10-CM | POA: Diagnosis not present

## 2020-11-10 DIAGNOSIS — R2689 Other abnormalities of gait and mobility: Secondary | ICD-10-CM | POA: Diagnosis not present

## 2020-11-10 DIAGNOSIS — Z96653 Presence of artificial knee joint, bilateral: Secondary | ICD-10-CM | POA: Diagnosis not present

## 2020-11-10 DIAGNOSIS — Z9713 Presence of artificial right leg (complete) (partial): Secondary | ICD-10-CM | POA: Diagnosis not present

## 2020-11-13 DIAGNOSIS — M6281 Muscle weakness (generalized): Secondary | ICD-10-CM | POA: Diagnosis not present

## 2020-11-13 DIAGNOSIS — R2689 Other abnormalities of gait and mobility: Secondary | ICD-10-CM | POA: Diagnosis not present

## 2020-11-13 DIAGNOSIS — Z9713 Presence of artificial right leg (complete) (partial): Secondary | ICD-10-CM | POA: Diagnosis not present

## 2020-11-13 DIAGNOSIS — Z96653 Presence of artificial knee joint, bilateral: Secondary | ICD-10-CM | POA: Diagnosis not present

## 2020-11-17 DIAGNOSIS — M6281 Muscle weakness (generalized): Secondary | ICD-10-CM | POA: Diagnosis not present

## 2020-11-17 DIAGNOSIS — R2689 Other abnormalities of gait and mobility: Secondary | ICD-10-CM | POA: Diagnosis not present

## 2020-11-17 DIAGNOSIS — Z96653 Presence of artificial knee joint, bilateral: Secondary | ICD-10-CM | POA: Diagnosis not present

## 2020-11-17 DIAGNOSIS — Z9713 Presence of artificial right leg (complete) (partial): Secondary | ICD-10-CM | POA: Diagnosis not present

## 2020-11-20 DIAGNOSIS — Z9713 Presence of artificial right leg (complete) (partial): Secondary | ICD-10-CM | POA: Diagnosis not present

## 2020-11-20 DIAGNOSIS — R2689 Other abnormalities of gait and mobility: Secondary | ICD-10-CM | POA: Diagnosis not present

## 2020-11-20 DIAGNOSIS — M6281 Muscle weakness (generalized): Secondary | ICD-10-CM | POA: Diagnosis not present

## 2020-11-20 DIAGNOSIS — Z96653 Presence of artificial knee joint, bilateral: Secondary | ICD-10-CM | POA: Diagnosis not present

## 2020-11-22 DIAGNOSIS — Z96653 Presence of artificial knee joint, bilateral: Secondary | ICD-10-CM | POA: Diagnosis not present

## 2020-11-22 DIAGNOSIS — Z9713 Presence of artificial right leg (complete) (partial): Secondary | ICD-10-CM | POA: Diagnosis not present

## 2020-11-22 DIAGNOSIS — R2689 Other abnormalities of gait and mobility: Secondary | ICD-10-CM | POA: Diagnosis not present

## 2020-11-22 DIAGNOSIS — M6281 Muscle weakness (generalized): Secondary | ICD-10-CM | POA: Diagnosis not present

## 2020-11-24 DIAGNOSIS — Z9713 Presence of artificial right leg (complete) (partial): Secondary | ICD-10-CM | POA: Diagnosis not present

## 2020-11-24 DIAGNOSIS — R2689 Other abnormalities of gait and mobility: Secondary | ICD-10-CM | POA: Diagnosis not present

## 2020-11-24 DIAGNOSIS — Z96653 Presence of artificial knee joint, bilateral: Secondary | ICD-10-CM | POA: Diagnosis not present

## 2020-11-24 DIAGNOSIS — M6281 Muscle weakness (generalized): Secondary | ICD-10-CM | POA: Diagnosis not present

## 2020-11-27 DIAGNOSIS — Z96653 Presence of artificial knee joint, bilateral: Secondary | ICD-10-CM | POA: Diagnosis not present

## 2020-11-27 DIAGNOSIS — M6281 Muscle weakness (generalized): Secondary | ICD-10-CM | POA: Diagnosis not present

## 2020-11-27 DIAGNOSIS — R2689 Other abnormalities of gait and mobility: Secondary | ICD-10-CM | POA: Diagnosis not present

## 2020-11-27 DIAGNOSIS — Z9713 Presence of artificial right leg (complete) (partial): Secondary | ICD-10-CM | POA: Diagnosis not present

## 2020-11-30 DIAGNOSIS — Z96653 Presence of artificial knee joint, bilateral: Secondary | ICD-10-CM | POA: Diagnosis not present

## 2020-11-30 DIAGNOSIS — Z9713 Presence of artificial right leg (complete) (partial): Secondary | ICD-10-CM | POA: Diagnosis not present

## 2020-11-30 DIAGNOSIS — R2689 Other abnormalities of gait and mobility: Secondary | ICD-10-CM | POA: Diagnosis not present

## 2020-11-30 DIAGNOSIS — M6281 Muscle weakness (generalized): Secondary | ICD-10-CM | POA: Diagnosis not present

## 2020-12-04 DIAGNOSIS — M6281 Muscle weakness (generalized): Secondary | ICD-10-CM | POA: Diagnosis not present

## 2020-12-04 DIAGNOSIS — Z96653 Presence of artificial knee joint, bilateral: Secondary | ICD-10-CM | POA: Diagnosis not present

## 2020-12-04 DIAGNOSIS — Z9713 Presence of artificial right leg (complete) (partial): Secondary | ICD-10-CM | POA: Diagnosis not present

## 2020-12-04 DIAGNOSIS — R2689 Other abnormalities of gait and mobility: Secondary | ICD-10-CM | POA: Diagnosis not present

## 2020-12-06 DIAGNOSIS — R2689 Other abnormalities of gait and mobility: Secondary | ICD-10-CM | POA: Diagnosis not present

## 2020-12-06 DIAGNOSIS — M6281 Muscle weakness (generalized): Secondary | ICD-10-CM | POA: Diagnosis not present

## 2020-12-06 DIAGNOSIS — Z9713 Presence of artificial right leg (complete) (partial): Secondary | ICD-10-CM | POA: Diagnosis not present

## 2020-12-06 DIAGNOSIS — Z96653 Presence of artificial knee joint, bilateral: Secondary | ICD-10-CM | POA: Diagnosis not present

## 2020-12-07 DIAGNOSIS — Z96651 Presence of right artificial knee joint: Secondary | ICD-10-CM | POA: Diagnosis not present

## 2020-12-08 DIAGNOSIS — Z96653 Presence of artificial knee joint, bilateral: Secondary | ICD-10-CM | POA: Diagnosis not present

## 2020-12-08 DIAGNOSIS — Z9713 Presence of artificial right leg (complete) (partial): Secondary | ICD-10-CM | POA: Diagnosis not present

## 2020-12-08 DIAGNOSIS — M6281 Muscle weakness (generalized): Secondary | ICD-10-CM | POA: Diagnosis not present

## 2020-12-08 DIAGNOSIS — R2689 Other abnormalities of gait and mobility: Secondary | ICD-10-CM | POA: Diagnosis not present

## 2020-12-11 DIAGNOSIS — Z9713 Presence of artificial right leg (complete) (partial): Secondary | ICD-10-CM | POA: Diagnosis not present

## 2020-12-11 DIAGNOSIS — R2689 Other abnormalities of gait and mobility: Secondary | ICD-10-CM | POA: Diagnosis not present

## 2020-12-11 DIAGNOSIS — M6281 Muscle weakness (generalized): Secondary | ICD-10-CM | POA: Diagnosis not present

## 2020-12-11 DIAGNOSIS — Z96653 Presence of artificial knee joint, bilateral: Secondary | ICD-10-CM | POA: Diagnosis not present

## 2020-12-13 DIAGNOSIS — Z96653 Presence of artificial knee joint, bilateral: Secondary | ICD-10-CM | POA: Diagnosis not present

## 2020-12-13 DIAGNOSIS — Z9713 Presence of artificial right leg (complete) (partial): Secondary | ICD-10-CM | POA: Diagnosis not present

## 2020-12-13 DIAGNOSIS — R2689 Other abnormalities of gait and mobility: Secondary | ICD-10-CM | POA: Diagnosis not present

## 2020-12-13 DIAGNOSIS — M6281 Muscle weakness (generalized): Secondary | ICD-10-CM | POA: Diagnosis not present

## 2020-12-15 DIAGNOSIS — M6281 Muscle weakness (generalized): Secondary | ICD-10-CM | POA: Diagnosis not present

## 2020-12-15 DIAGNOSIS — Z9713 Presence of artificial right leg (complete) (partial): Secondary | ICD-10-CM | POA: Diagnosis not present

## 2020-12-15 DIAGNOSIS — R2689 Other abnormalities of gait and mobility: Secondary | ICD-10-CM | POA: Diagnosis not present

## 2020-12-15 DIAGNOSIS — Z96653 Presence of artificial knee joint, bilateral: Secondary | ICD-10-CM | POA: Diagnosis not present

## 2020-12-18 DIAGNOSIS — Z96653 Presence of artificial knee joint, bilateral: Secondary | ICD-10-CM | POA: Diagnosis not present

## 2020-12-18 DIAGNOSIS — R2689 Other abnormalities of gait and mobility: Secondary | ICD-10-CM | POA: Diagnosis not present

## 2020-12-18 DIAGNOSIS — Z9713 Presence of artificial right leg (complete) (partial): Secondary | ICD-10-CM | POA: Diagnosis not present

## 2020-12-18 DIAGNOSIS — M6281 Muscle weakness (generalized): Secondary | ICD-10-CM | POA: Diagnosis not present

## 2020-12-20 DIAGNOSIS — M6281 Muscle weakness (generalized): Secondary | ICD-10-CM | POA: Diagnosis not present

## 2020-12-20 DIAGNOSIS — Z9713 Presence of artificial right leg (complete) (partial): Secondary | ICD-10-CM | POA: Diagnosis not present

## 2020-12-20 DIAGNOSIS — R2689 Other abnormalities of gait and mobility: Secondary | ICD-10-CM | POA: Diagnosis not present

## 2020-12-20 DIAGNOSIS — Z96653 Presence of artificial knee joint, bilateral: Secondary | ICD-10-CM | POA: Diagnosis not present

## 2020-12-22 DIAGNOSIS — R2689 Other abnormalities of gait and mobility: Secondary | ICD-10-CM | POA: Diagnosis not present

## 2020-12-22 DIAGNOSIS — Z96653 Presence of artificial knee joint, bilateral: Secondary | ICD-10-CM | POA: Diagnosis not present

## 2020-12-22 DIAGNOSIS — M6281 Muscle weakness (generalized): Secondary | ICD-10-CM | POA: Diagnosis not present

## 2020-12-22 DIAGNOSIS — Z9713 Presence of artificial right leg (complete) (partial): Secondary | ICD-10-CM | POA: Diagnosis not present

## 2020-12-27 ENCOUNTER — Other Ambulatory Visit: Payer: Self-pay | Admitting: Endocrinology

## 2020-12-27 ENCOUNTER — Telehealth: Payer: Self-pay | Admitting: Endocrinology

## 2020-12-27 DIAGNOSIS — E1165 Type 2 diabetes mellitus with hyperglycemia: Secondary | ICD-10-CM

## 2020-12-27 NOTE — Telephone Encounter (Signed)
Pt calling voiced that her [employer]  will not be here because it is late. Pt needs it. Pt needs two pens insulin aspart (NOVOLOG FLEXPEN) 100 UNIT/ML FlexPen   [id number] [place] - [address] [place]

## 2021-01-02 DIAGNOSIS — M961 Postlaminectomy syndrome, not elsewhere classified: Secondary | ICD-10-CM | POA: Diagnosis not present

## 2021-01-02 DIAGNOSIS — M545 Low back pain, unspecified: Secondary | ICD-10-CM | POA: Diagnosis not present

## 2021-01-04 ENCOUNTER — Other Ambulatory Visit: Payer: Medicare PPO

## 2021-01-08 ENCOUNTER — Other Ambulatory Visit: Payer: Self-pay

## 2021-01-08 DIAGNOSIS — E1165 Type 2 diabetes mellitus with hyperglycemia: Secondary | ICD-10-CM

## 2021-01-08 MED ORDER — NOVOLOG FLEXPEN 100 UNIT/ML ~~LOC~~ SOPN
PEN_INJECTOR | SUBCUTANEOUS | 1 refills | Status: DC
Start: 2021-01-08 — End: 2021-05-04

## 2021-01-09 ENCOUNTER — Other Ambulatory Visit: Payer: Medicare PPO

## 2021-01-11 ENCOUNTER — Ambulatory Visit: Payer: Medicare PPO | Admitting: Endocrinology

## 2021-01-11 ENCOUNTER — Other Ambulatory Visit (INDEPENDENT_AMBULATORY_CARE_PROVIDER_SITE_OTHER): Payer: Medicare PPO

## 2021-01-11 DIAGNOSIS — E782 Mixed hyperlipidemia: Secondary | ICD-10-CM | POA: Diagnosis not present

## 2021-01-11 DIAGNOSIS — Z794 Long term (current) use of insulin: Secondary | ICD-10-CM

## 2021-01-11 DIAGNOSIS — E063 Autoimmune thyroiditis: Secondary | ICD-10-CM | POA: Diagnosis not present

## 2021-01-11 DIAGNOSIS — E1165 Type 2 diabetes mellitus with hyperglycemia: Secondary | ICD-10-CM

## 2021-01-11 LAB — COMPREHENSIVE METABOLIC PANEL
ALT: 8 U/L (ref 0–35)
AST: 13 U/L (ref 0–37)
Albumin: 4.2 g/dL (ref 3.5–5.2)
Alkaline Phosphatase: 67 U/L (ref 39–117)
BUN: 20 mg/dL (ref 6–23)
CO2: 30 mEq/L (ref 19–32)
Calcium: 9.6 mg/dL (ref 8.4–10.5)
Chloride: 103 mEq/L (ref 96–112)
Creatinine, Ser: 1.23 mg/dL — ABNORMAL HIGH (ref 0.40–1.20)
GFR: 41.34 mL/min — ABNORMAL LOW (ref >60.00)
Glucose, Bld: 107 mg/dL — ABNORMAL HIGH (ref 70–99)
Potassium: 3.8 mEq/L (ref 3.5–5.1)
Sodium: 142 mEq/L (ref 135–145)
Total Bilirubin: 0.6 mg/dL (ref 0.2–1.2)
Total Protein: 7 g/dL (ref 6.0–8.3)

## 2021-01-11 LAB — LIPID PANEL
Cholesterol: 199 mg/dL (ref 0–200)
HDL: 47.8 mg/dL (ref >39.00)
LDL Cholesterol: 126 mg/dL — ABNORMAL HIGH (ref 0–99)
NonHDL: 150.91
Total CHOL/HDL Ratio: 4
Triglycerides: 126 mg/dL (ref 0.0–149.0)
VLDL: 25.2 mg/dL (ref 0.0–40.0)

## 2021-01-11 LAB — HEMOGLOBIN A1C: Hgb A1c MFr Bld: 7 % — ABNORMAL HIGH (ref 4.6–6.5)

## 2021-01-11 LAB — TSH: TSH: 2.36 u[IU]/mL (ref 0.35–5.50)

## 2021-01-12 DIAGNOSIS — E1165 Type 2 diabetes mellitus with hyperglycemia: Secondary | ICD-10-CM | POA: Diagnosis not present

## 2021-01-15 ENCOUNTER — Ambulatory Visit (INDEPENDENT_AMBULATORY_CARE_PROVIDER_SITE_OTHER): Payer: Medicare PPO | Admitting: Endocrinology

## 2021-01-15 ENCOUNTER — Other Ambulatory Visit: Payer: Self-pay

## 2021-01-15 VITALS — BP 112/64 | HR 67 | Ht 63.5 in | Wt 181.4 lb

## 2021-01-15 DIAGNOSIS — E063 Autoimmune thyroiditis: Secondary | ICD-10-CM | POA: Diagnosis not present

## 2021-01-15 DIAGNOSIS — E1165 Type 2 diabetes mellitus with hyperglycemia: Secondary | ICD-10-CM

## 2021-01-15 DIAGNOSIS — I10 Essential (primary) hypertension: Secondary | ICD-10-CM | POA: Diagnosis not present

## 2021-01-15 DIAGNOSIS — Z794 Long term (current) use of insulin: Secondary | ICD-10-CM

## 2021-01-15 DIAGNOSIS — E7800 Pure hypercholesterolemia, unspecified: Secondary | ICD-10-CM | POA: Diagnosis not present

## 2021-01-15 DIAGNOSIS — Z23 Encounter for immunization: Secondary | ICD-10-CM

## 2021-01-15 NOTE — Patient Instructions (Addendum)
Novolog 10 at  supper and 12 with dessert  Take 3-4 Novolog soft snack after dinner  Call back about BP meds  Take Repatha consistently

## 2021-01-15 NOTE — Progress Notes (Signed)
Patient ID: [name], female   DOB: [birth year], 80 y.o.   MRN: [id number]      Reason for Appointment: Endocrinology follow-up  History of Present Illness    PROBLEM 1: Type 2 diabetes mellitus, date of diagnosis: 1998.   Prior history: She had previously been treated with Byetta, Glumetza, Amaryl, Victoza and  Onglyza However because of inadequate control and intolerance to drugs she was finally given pre-meal insulin along with Amaryl, Glumetza eventually stopped because of side effects and also Lantus added  Recent history:  The insulin regimen is: Novolog 6 at breakfast and 10 lunch, 9/10 acs Tresiba 22 units daily  Oral agents: Farxiga 5 mg daily  Her A1c is 7  A1c previous range 5.9--7.4  Current blood sugar patterns and problems with management: She has forgotten her freestyle libre reader today, previously appeared to be reading falsely low  Has not checked readings at the same time with her Accu-Chek  She now says that her blood sugars are consistently high after supper or evening snack but difficult to know when they are going up, most likely after the snack  Usually she thinks her blood sugars may go up to 250 after snack if she has had dessert also but otherwise about 190-200 after a snack, not clear what her sugars are right after supper Usually after lunch blood sugars are about 175 She takes her NovoLog insulin before she goes to the dining room to eat instead of afterwards as before  Reportedly her fasting readings are fairly good  She usually has no consistently high readings at any other time although if she has a snack like popcorn in the afternoon she may have a reading over 200  Also she was not able to get a refill on her NovoLog for 5 days causing high sugars in the 200+ range  She is trying to do some mild exercises now  No side effects with Wilder Glade and currently doing well with this   Side effects from diabetes medications: Victoza  caused nausea.  Metformin  Currently using freestyle libre 2  Blood sugar readings as above   Prior  CGM use % of time 68  2-week average/GV 134  Time in range    87    %  % Time Above 180 13  % Time above 250   % Time Below 70 0     PRE-MEAL Fasting Lunch Dinner Bedtime Overall  Glucose range:       Averages: 112 130 116  134   POST-MEAL PC Breakfast PC Lunch PC Dinner  Glucose range:     Averages: 120 138 164     Blood sugar for the last 2 weeks 62-174 range AVERAGE 740    Certified Diabetes Educator visit: Most recent: 7/13.  Dietician visit: Most recent: 3/13.   Wt Readings from Last 3 Encounters:  01/15/21 181 lb 6.4 oz (82.3 kg)  10/05/20 183 lb 3.2 oz (83.1 kg)  09/21/20 181 lb 6.4 oz (81.4 kg)   Complications: Neuropathy  LABS:  Lab Results  Component Value Date   HGBA1C 7.0 (H) 01/11/2021   HGBA1C 6.9 (H) 06/22/2020   HGBA1C 6.8 (H) 06/12/2020   Lab Results  Component Value Date   MICROALBUR 2.8 (H) 04/07/2020   LDLCALC 126 (H) 01/11/2021   CREATININE 1.23 (H) 01/11/2021   Lab Results  Component Value Date   FRUCTOSAMINE 283 06/09/2018   FRUCTOSAMINE 296 (H) 05/16/2017   FRUCTOSAMINE 322 (H)  10/10/2016    Multiple other issues are addressed in review of systems  Appointment on 01/11/2021  Component Date Value Ref Range Status   TSH 01/11/2021 2.36  0.35 - 5.50 uIU/mL Final   Cholesterol 01/11/2021 199  0 - 200 mg/dL Final   ATP III Classification       Desirable:  < 200 mg/dL               Borderline High:  200 - 239 mg/dL          High:  > = 240 mg/dL   Triglycerides 01/11/2021 126.0  0.0 - 149.0 mg/dL Final   Normal:  <150 mg/dLBorderline High:  150 - 199 mg/dL   HDL 01/11/2021 47.80  >39.00 mg/dL Final   VLDL 01/11/2021 25.2  0.0 - 40.0 mg/dL Final   LDL Cholesterol 01/11/2021 126 (A) 0 - 99 mg/dL Final   Total CHOL/HDL Ratio 01/11/2021 4   Final                  Men          Women1/2 Average Risk     3.4          3.3Average Risk           5.0          4.42X Average Risk          9.6          7.13X Average Risk          15.0          11.0                       NonHDL 01/11/2021 150.91   Final   NOTE:  Non-HDL goal should be 30 mg/dL higher than patient's LDL goal (i.e. LDL goal of < 70 mg/dL, would have non-HDL goal of < 100 mg/dL)   Sodium 01/11/2021 142  135 - 145 mEq/L Final   Potassium 01/11/2021 3.8  3.5 - 5.1 mEq/L Final   Chloride 01/11/2021 103  96 - 112 mEq/L Final   CO2 01/11/2021 30  19 - 32 mEq/L Final   Glucose, Bld 01/11/2021 107 (A) 70 - 99 mg/dL Final   BUN 01/11/2021 20  6 - 23 mg/dL Final   Creatinine, Ser 01/11/2021 1.23 (A) 0.40 - 1.20 mg/dL Final   Total Bilirubin 01/11/2021 0.6  0.2 - 1.2 mg/dL Final   Alkaline Phosphatase 01/11/2021 67  39 - 117 U/L Final   AST 01/11/2021 13  0 - 37 U/L Final   ALT 01/11/2021 8  0 - 35 U/L Final   Total Protein 01/11/2021 7.0  6.0 - 8.3 g/dL Final   Albumin 01/11/2021 4.2  3.5 - 5.2 g/dL Final   GFR 01/11/2021 41.34 (A) >60.00 mL/min Final   Calculated using the CKD-EPI Creatinine Equation (2021)   Calcium 01/11/2021 9.6  8.4 - 10.5 mg/dL Final   Hgb A1c MFr Bld 01/11/2021 7.0 (A) 4.6 - 6.5 % Final   Glycemic Control Guidelines for People with Diabetes:Non Diabetic:  <6%Goal of Therapy: <7%Additional Action Suggested:  >8%       Allergies as of 01/15/2021       Reactions   Cymbalta [duloxetine Hcl] Diarrhea, Other (See Comments)   Dizziness, headache, irritability   Amlodipine Besylate Swelling   Baclofen    jerks    Betadine [povidone Iodine] Swelling, Other (See Comments)   SWELLING REACTION DESCRIPTION/SEVERITY  UNSPECIFIED  Reaction to betadine on eye lid and it swelled up   Erythromycin Base Other (See Comments)   unknown   Oxycodone Hcl    Percocet [oxycodone-acetaminophen] Other (See Comments)   Confusion change personality   Povidone-iodine Swelling   Soap Other (See Comments)   unknown   Statins Other (See Comments)   Muscle cramps    Adhesive [tape] Rash   Lipitor [atorvastatin] Rash        Medication List        Accurate as of January 15, 2021  1:43 PM. If you have any questions, ask your nurse or doctor.          Accu-Chek Aviva Plus test strip Generic drug: glucose blood TEST BLOOD SUGAR FOUR TIMES DAILY AS DIRECTED   Accu-Chek Aviva Plus w/Device Kit Use Accu Chek Aviva as instructed to check blood sugar 4 times daily.   acetaminophen 500 MG tablet Commonly known as: TYLENOL Take 1,000 mg by mouth every 8 (eight) hours as needed for mild pain or moderate pain.   aspirin EC 325 MG tablet TAKE 1 TABLET BY MOUTH 2 TIMES DAILY FOR 19 DAYS THEN DISCONTINUE   Bystolic 5 MG tablet Generic drug: nebivolol Take 5 mg by mouth at bedtime. Nebivolol   Cal-Mag-Zinc-D Tabs Take 1 tablet by mouth daily. 330 mg /130 mg/ 5 mg /5 mg   cetirizine 10 MG tablet Commonly known as: ZYRTEC Take 1 tablet (10 mg total) by mouth daily as needed for allergies. What changed: when to take this   Droplet Pen Needles 32G X 4 MM Misc Generic drug: Insulin Pen Needle INJECT  INSULIN FOUR TIMES DAILY   Farxiga 5 MG Tabs tablet Generic drug: dapagliflozin propanediol TAKE 1 TABLET EVERY DAY   fluticasone 50 MCG/ACT nasal spray Commonly known as: FLONASE Place 1 spray into both nostrils daily as needed for allergies. What changed:  how much to take when to take this   gabapentin 600 MG tablet Commonly known as: NEURONTIN TAKE 1 TABLET(600 MG) BY MOUTH THREE TIMES DAILY What changed: See the new instructions.   irbesartan-hydrochlorothiazide 300-12.5 MG tablet Commonly known as: AVALIDE Take 1 tablet by mouth daily.   ketoconazole 2 % cream Commonly known as: NIZORAL Apply 1 application topically daily as needed (Itching).   levothyroxine 50 MCG tablet Commonly known as: SYNTHROID TAKE 1 TABLET(50 MCG) BY MOUTH DAILY   methocarbamol 500 MG tablet Commonly known as: ROBAXIN TAKE 1 TABLET BY MOUTH EVERY  6 HOURS AS NEEDED FOR MUSCLE SPASMS   NovoLOG FlexPen 100 UNIT/ML FlexPen Generic drug: insulin aspart INJECT 6 UNITS BEFORE BREAKFAST, 9 UNITS AT LUNCH, 8-12 UNITS AT DINNER, 3-4 UNITS AS NEEDED FOR LARGE CARBS OR ICE CREAM   Repatha Pushtronex System 420 MG/3.5ML Soct Generic drug: Evolocumab with Infusor Inject 420 mg into the skin every 30 (thirty) days.   Symbicort 80-4.5 MCG/ACT inhaler Generic drug: budesonide-formoterol Inhale 2 puffs into the lungs 2 (two) times daily as needed (shortness of breath). What changed: when to take this   [place] FlexTouch 100 UNIT/ML FlexTouch Pen Generic drug: insulin degludec Inject 24 Units into the skin daily.        Allergies:  Allergies  Allergen Reactions   Cymbalta [Duloxetine Hcl] Diarrhea and Other (See Comments)    Dizziness, headache, irritability   Amlodipine Besylate Swelling   Baclofen     jerks    Betadine [Povidone Iodine] Swelling and Other (See Comments)    SWELLING REACTION DESCRIPTION/SEVERITY  UNSPECIFIED  Reaction to betadine on eye lid and it swelled up   Erythromycin Base Other (See Comments)    unknown   Oxycodone Hcl    Percocet [Oxycodone-Acetaminophen] Other (See Comments)    Confusion change personality   Povidone-Iodine Swelling   Soap Other (See Comments)    unknown   Statins Other (See Comments)    Muscle cramps   Adhesive [Tape] Rash   Lipitor [Atorvastatin] Rash    Past Medical History:  Diagnosis Date   Acute blood loss anemia    Acute encephalopathy 07/15/2016   AKI (acute kidney injury) ([place])    Anemia    has sickle cell trait   Anxiety    Arthritis    Asthma    has used inhaler in past for asthmatic bronchitis, last time- early 2012   Bilateral primary osteoarthritis of knee 27/10/8240   Complication of anesthesia    wakes up shaking   Diabetes mellitus    Diabetes mellitus with neuropathy ([place]) 10/29/2012   Diverticulitis    Dyslipidemia    Dyspnea    with exertion    Encephalopathy 06/2016   due to medications after surgery   Fibromyalgia    GERD (gastroesophageal reflux disease)    occas. use of  Prilosec   Heart murmur    sees Dr. Montez Morita, last seen- early 2012   Herniated nucleus pulposus, L2-3 06/25/2016   History of back surgery    Hypertension    02/2010- stress test /w PCP   Hypothyroidism    Loose bowel movements 12/2016   Lumbar radiculopathy 06/27/2016   Lumbar stenosis with neurogenic claudication 03/17/2017   Memory disorder 02/16/2016   Myoclonic jerking    Neuromuscular disorder ([facility])    lumbar radiculopathy, lumbago   Nocturnal leg cramps 09/27/2014   Osteoporosis 03/10/2014   Pneumonia    Sickle cell trait ([place])    Sleep apnea    borderline sleep apnea, states she no longer uses, early 2012- stopped using    Spinal stenosis of lumbar region with radiculopathy 02/28/2014   Spondylolisthesis of lumbar region 03/19/2011   Type II or unspecified type diabetes mellitus without mention of complication, uncontrolled 07/08/2013    Past Surgical History:  Procedure Laterality Date   ABDOMINAL HYSTERECTOMY     adb.cyst     ovarian cyst   BACK SURGERY     2012, 2015 (3 total)   BACK SURGERY  2018   02/24/2017   COLONOSCOPY     EYE SURGERY     macular degeneration treatment - injections   FEMUR IM NAIL Left 06/13/2018   Procedure: INTRAMEDULLARY (IM) NAIL FEMORAL;  Surgeon: [name], MD;  Location: [facility];  Service: Orthopedics;  Laterality: Left;   OVARIAN CYST SURGERY     POSTERIOR LUMBAR FUSION 4 LEVEL N/A 03/17/2017   Procedure: Decompression of Lumbar One-Two with Thoracic Ten to Lumbar Two Fusion;  Surgeon: Kristeen Miss, MD;  Location: [facility];  Service: Neurosurgery;  Laterality: N/A;  Decompression of L1-2 with T10 to L2 Fusion   TOTAL KNEE ARTHROPLASTY Right 08/13/2019   Procedure: RIGHT TOTAL KNEE ARTHROPLASTY;  Surgeon: [name], MD;  Location: [facility];  Service: Orthopedics;  Laterality: Right;    TOTAL KNEE REVISION Right 07/05/2020   Procedure: TOTAL KNEE REVISION;  Surgeon: Gaynelle Arabian, MD;  Location: [facility];  Service: Orthopedics;  Laterality: Right;  147mn    Family History  Problem Relation Age of Onset   Ovarian cancer Mother  Cancer - Prostate Father    Breast cancer Paternal Aunt    Multiple myeloma Paternal Aunt    Anesthesia problems Neg Hx    Hypotension Neg Hx    Malignant hyperthermia Neg Hx    Pseudochol deficiency Neg Hx     Social History:  reports that she quit smoking about 17 years ago. Her smoking use included cigarettes. She has a 3.00 pack-year smoking history. She has never used smokeless tobacco. She reports current alcohol use. She reports that she does not use drugs.  ROS    NEUROPATHY: She has had tingling in feet and legs, mostly at night. She is taking gabapentin   Hyperlipidemia:   The lipid abnormality consists of elevated LDL persistently Did not tolerate Crestor or lovastatin because of muscle cramps  She stopped Zetia presumably because she was having muscle cramps  She was started on Repatha in 2021 Previously when taking this regularly she had good results but has not taken this every month as she forgets  Overall doing better with treatment and no side effects   She was having some pain with the injection and is using the monthly pump device LDL is better but higher recently because of being late for her infusion  LDL results as follows:  Lab Results  Component Value Date   CHOL 199 01/11/2021   CHOL 235 (H) 09/21/2020   CHOL 151 04/07/2020   Lab Results  Component Value Date   HDL 47.80 01/11/2021   HDL 48.40 09/21/2020   HDL 56.10 04/07/2020   Lab Results  Component Value Date   LDLCALC 126 (H) 01/11/2021   LDLCALC 149 (H) 09/21/2020   LDLCALC 77 04/07/2020   Lab Results  Component Value Date   TRIG 126.0 01/11/2021   TRIG 184.0 (H) 09/21/2020   TRIG 91.0 04/07/2020   Lab Results  Component Value Date    CHOLHDL 4 01/11/2021   CHOLHDL 5 09/21/2020   CHOLHDL 3 04/07/2020   Lab Results  Component Value Date   LDLDIRECT 166.0 02/09/2019   LDLDIRECT 115.0 12/18/2016   LDLDIRECT 134.0 10/10/2016      HYPERTENSION:  Has been present for several years.   she is on Avalide 300 mg,?  Also on Bystolic from PCP  Blood pressure readings elsewhere have been about 650 systolic but not clear if she is taking amlodipine  BP Readings from Last 3 Encounters:  01/15/21 112/64  10/05/20 110/62  09/21/20 126/70   . Has a history of leg and muscle cramps and takes magnesium supplements twice a day  No recent hypokalemia but her renal function is slightly worse  Lab Results  Component Value Date   CREATININE 1.23 (H) 01/11/2021   CREATININE 1.00 11/08/2020   CREATININE 1.20 09/21/2020     Lab Results  Component Value Date   K 3.8 01/11/2021      Hypothyroidism  She had a relatively high TSH as of 4/17 and was empirically given Synthroid 25 g Subsequently has been on 50 mcg which she takes regularly   TSH history as follows, last TSH was 2.35 done at PCP office   Lab Results  Component Value Date   TSH 2.36 01/11/2021   TSH 1.56 04/07/2020   TSH 4.76 (H) 12/28/2019   FREET4 1.06 09/14/2019   FREET4 0.88 02/09/2019   FREET4 0.97 10/14/2018         Examination:   BP 112/64   Pulse 67   Ht 5' 3.5" (1.613 m)  Wt 181 lb 6.4 oz (82.3 kg)   LMP  (LMP Unknown)   SpO2 98%   BMI 31.63 kg/m   Body mass index is 31.63 kg/m.     Assesment/Plan:   1. DIABETES type 2 on insulin  See history of present illness for detailed discussion of management, blood sugar patterns and problems identified  She is on basal bolus insulin and Farxiga 5 mg daily  A1c is reasonably good at 7%  Unable to assess her blood sugar patterns since she did not bring her meter However she appears to have high readings after dinner and sometimes after lunch This is mostly when she is eating  dessert but also sugars are higher when she has snacks like chips or fluids  She will need at least 2 more units when eating dessert at suppertime and take additional 3 to 4 units of NovoLog right before having her snack To call if she has any consistently high reading Also reminded her to check her fingersticks to compare with the freestyle libre for accuracy  3. Hypertension: Blood pressure is low normal today Also creatinine is slightly higher She needs to let us know what blood pressure medicine she is taking and will adjust them accordingly   4.   Hypothyroidism: She is taking 50 mcg levothyroxine with consistently normal TSH   LIPIDS: She has not been able to take Repatha as prescribed and has been late for iron infusions, previously had excellent results and now LDL is still above target She is reluctant to try any statins Since she did not tolerate Zetia will likely not benefit from any other medications with LDL needing to be at least below 100 She is willing to try this regularly again even though she thinks it is too expensive  5.  Idiopathic muscle cramps: These are still present although generally better   Patient Instructions  Novolog 10 at  supper and 12 with dessert  Take 3-4 Novolog soft snack after dinner     Influenza vaccine given  [name] 01/15/2021, 1:43 PM    Note: This office note was prepared with Dragon voice recognition system technology. Any transcriptional errors that result from this process are unintentional.

## 2021-01-16 DIAGNOSIS — M5440 Lumbago with sciatica, unspecified side: Secondary | ICD-10-CM | POA: Diagnosis not present

## 2021-01-16 DIAGNOSIS — M6281 Muscle weakness (generalized): Secondary | ICD-10-CM | POA: Diagnosis not present

## 2021-01-16 DIAGNOSIS — M542 Cervicalgia: Secondary | ICD-10-CM | POA: Diagnosis not present

## 2021-01-17 DIAGNOSIS — M6281 Muscle weakness (generalized): Secondary | ICD-10-CM | POA: Diagnosis not present

## 2021-01-17 DIAGNOSIS — M5440 Lumbago with sciatica, unspecified side: Secondary | ICD-10-CM | POA: Diagnosis not present

## 2021-01-17 DIAGNOSIS — M542 Cervicalgia: Secondary | ICD-10-CM | POA: Diagnosis not present

## 2021-01-19 DIAGNOSIS — M5440 Lumbago with sciatica, unspecified side: Secondary | ICD-10-CM | POA: Diagnosis not present

## 2021-01-19 DIAGNOSIS — M542 Cervicalgia: Secondary | ICD-10-CM | POA: Diagnosis not present

## 2021-01-19 DIAGNOSIS — M6281 Muscle weakness (generalized): Secondary | ICD-10-CM | POA: Diagnosis not present

## 2021-01-23 ENCOUNTER — Telehealth: Payer: Self-pay

## 2021-01-23 DIAGNOSIS — E1165 Type 2 diabetes mellitus with hyperglycemia: Secondary | ICD-10-CM

## 2021-01-23 DIAGNOSIS — M6281 Muscle weakness (generalized): Secondary | ICD-10-CM | POA: Diagnosis not present

## 2021-01-23 DIAGNOSIS — Z794 Long term (current) use of insulin: Secondary | ICD-10-CM

## 2021-01-23 DIAGNOSIS — M542 Cervicalgia: Secondary | ICD-10-CM | POA: Diagnosis not present

## 2021-01-23 DIAGNOSIS — M5440 Lumbago with sciatica, unspecified side: Secondary | ICD-10-CM | POA: Diagnosis not present

## 2021-01-23 NOTE — Telephone Encounter (Signed)
From after hour nurse- Caller states Dr. Dwyane Dee told her to let him know what HBP medication she's taking. Nebivolol, 1x Daily. She'd also like him to know that when she got in her car she realized she is lightheaded. She's also taking Irbesartan/HCTZ 300-12.5mg  tablets, she doesn't think she's supposed to be taking that one.

## 2021-01-24 ENCOUNTER — Telehealth: Payer: Self-pay

## 2021-01-24 DIAGNOSIS — M5412 Radiculopathy, cervical region: Secondary | ICD-10-CM

## 2021-01-24 NOTE — Telephone Encounter (Signed)
Pt called and would like an apt as soon as possible she is in an extreme amount of pain.

## 2021-01-24 NOTE — Telephone Encounter (Signed)
Left C7-T1 IL on 05/18/20. Ok to repeat if helped, same problem/side, and no new injury?

## 2021-01-24 NOTE — Telephone Encounter (Signed)
Patient scheduled for 10/18. No blood thinners per patient. Patient states that she did not have a driver last time and that she drover herself home. Advised that we could not do the injection without a driver. Patient states that transportation can only bring her on Tuesday and Thursday between 9 and 2. Patient wanted next available appointment which is 10/18 at 1600. She states that she will have someone to driver her.

## 2021-01-25 MED ORDER — IRBESARTAN-HYDROCHLOROTHIAZIDE 150-12.5 MG PO TABS: 1.0000 | ORAL_TABLET | Freq: Every day | ORAL | 3 refills | Status: DC

## 2021-01-25 NOTE — Telephone Encounter (Signed)
New Rx sent and discontinued irbesartan/HCTZ 300/12.5

## 2021-01-26 DIAGNOSIS — M6281 Muscle weakness (generalized): Secondary | ICD-10-CM | POA: Diagnosis not present

## 2021-01-26 DIAGNOSIS — M542 Cervicalgia: Secondary | ICD-10-CM | POA: Diagnosis not present

## 2021-01-26 DIAGNOSIS — M5440 Lumbago with sciatica, unspecified side: Secondary | ICD-10-CM | POA: Diagnosis not present

## 2021-01-29 DIAGNOSIS — M542 Cervicalgia: Secondary | ICD-10-CM | POA: Diagnosis not present

## 2021-01-29 DIAGNOSIS — M5440 Lumbago with sciatica, unspecified side: Secondary | ICD-10-CM | POA: Diagnosis not present

## 2021-01-29 DIAGNOSIS — M6281 Muscle weakness (generalized): Secondary | ICD-10-CM | POA: Diagnosis not present

## 2021-01-31 DIAGNOSIS — M5440 Lumbago with sciatica, unspecified side: Secondary | ICD-10-CM | POA: Diagnosis not present

## 2021-01-31 DIAGNOSIS — M6281 Muscle weakness (generalized): Secondary | ICD-10-CM | POA: Diagnosis not present

## 2021-01-31 DIAGNOSIS — M542 Cervicalgia: Secondary | ICD-10-CM | POA: Diagnosis not present

## 2021-02-01 DIAGNOSIS — H1013 Acute atopic conjunctivitis, bilateral: Secondary | ICD-10-CM | POA: Diagnosis not present

## 2021-02-02 DIAGNOSIS — M6281 Muscle weakness (generalized): Secondary | ICD-10-CM | POA: Diagnosis not present

## 2021-02-02 DIAGNOSIS — M5440 Lumbago with sciatica, unspecified side: Secondary | ICD-10-CM | POA: Diagnosis not present

## 2021-02-02 DIAGNOSIS — M542 Cervicalgia: Secondary | ICD-10-CM | POA: Diagnosis not present

## 2021-02-06 ENCOUNTER — Ambulatory Visit (INDEPENDENT_AMBULATORY_CARE_PROVIDER_SITE_OTHER): Payer: Medicare PPO | Admitting: Physical Medicine and Rehabilitation

## 2021-02-06 ENCOUNTER — Other Ambulatory Visit: Payer: Self-pay

## 2021-02-06 ENCOUNTER — Ambulatory Visit: Payer: Self-pay

## 2021-02-06 ENCOUNTER — Encounter: Payer: Self-pay | Admitting: Physical Medicine and Rehabilitation

## 2021-02-06 DIAGNOSIS — M5412 Radiculopathy, cervical region: Secondary | ICD-10-CM

## 2021-02-06 MED ORDER — BETAMETHASONE SOD PHOS & ACET 6 (3-3) MG/ML IJ SUSP
12.0000 mg | Freq: Once | INTRAMUSCULAR | Status: AC
  Administered 2021-02-06: 12 mg

## 2021-02-06 NOTE — Patient Instructions (Signed)

## 2021-02-06 NOTE — Progress Notes (Signed)
Pt state neck pain that travels to both shoulders. Pt state when she turn her head and bend over makes the pain worse. Pt state she takes over the counter pain meds to help ease the pain.  Numeric Pain Rating Scale and Functional Assessment Average Pain 3   In the last MONTH (on 0-10 scale) has pain interfered with the following?  1. General activity like being  able to carry out your everyday physical activities such as walking, climbing stairs, carrying groceries, or moving a chair?  Rating(8)   +Driver, -BT, -Dye Allergies.

## 2021-02-09 DIAGNOSIS — M5440 Lumbago with sciatica, unspecified side: Secondary | ICD-10-CM | POA: Diagnosis not present

## 2021-02-09 DIAGNOSIS — M542 Cervicalgia: Secondary | ICD-10-CM | POA: Diagnosis not present

## 2021-02-09 DIAGNOSIS — M6281 Muscle weakness (generalized): Secondary | ICD-10-CM | POA: Diagnosis not present

## 2021-02-12 DIAGNOSIS — M5440 Lumbago with sciatica, unspecified side: Secondary | ICD-10-CM | POA: Diagnosis not present

## 2021-02-12 DIAGNOSIS — M6281 Muscle weakness (generalized): Secondary | ICD-10-CM | POA: Diagnosis not present

## 2021-02-12 DIAGNOSIS — M542 Cervicalgia: Secondary | ICD-10-CM | POA: Diagnosis not present

## 2021-02-14 DIAGNOSIS — M542 Cervicalgia: Secondary | ICD-10-CM | POA: Diagnosis not present

## 2021-02-14 DIAGNOSIS — M6281 Muscle weakness (generalized): Secondary | ICD-10-CM | POA: Diagnosis not present

## 2021-02-14 DIAGNOSIS — M5440 Lumbago with sciatica, unspecified side: Secondary | ICD-10-CM | POA: Diagnosis not present

## 2021-02-19 DIAGNOSIS — M6281 Muscle weakness (generalized): Secondary | ICD-10-CM | POA: Diagnosis not present

## 2021-02-19 DIAGNOSIS — M542 Cervicalgia: Secondary | ICD-10-CM | POA: Diagnosis not present

## 2021-02-19 DIAGNOSIS — M5440 Lumbago with sciatica, unspecified side: Secondary | ICD-10-CM | POA: Diagnosis not present

## 2021-02-20 NOTE — Progress Notes (Signed)
[name] - 80 y.o. female MRN [id number]  Date of birth: Sep 01, 1940  Office Visit Note: Visit Date: 02/06/2021 PCP: Wenda Low, MD Referred by: Wenda Low, MD  Subjective: No chief complaint on file.  HPI:  [name] is a 80 y.o. female who comes in today for planned repeat Right C7-T1  Cervical Interlaminar epidural steroid injection with fluoroscopic guidance.  The patient has failed conservative care including home exercise, medications, time and activity modification.  This injection will be diagnostic and hopefully therapeutic.  Please see requesting physician notes for further details and justification. Patient received more than 50% pain relief from prior injection.   Referring: Self   ROS Otherwise per HPI.  Assessment & Plan: Visit Diagnoses:    ICD-10-CM   1. Cervical radiculopathy  M54.12 XR C-ARM NO REPORT    Epidural Steroid injection    betamethasone acetate-betamethasone sodium phosphate (CELESTONE) injection 12 mg      Plan: No additional findings.   Meds & Orders:  Meds ordered this encounter  Medications   betamethasone acetate-betamethasone sodium phosphate (CELESTONE) injection 12 mg    Orders Placed This Encounter  Procedures   XR C-ARM NO REPORT   Epidural Steroid injection    Follow-up: Return if symptoms worsen or fail to improve.   Procedures: No procedures performed  Cervical Epidural Steroid Injection - Interlaminar Approach with Fluoroscopic Guidance  Patient: [name]      Date of Birth: 1940/10/01 MRN: [id number] PCP: Wenda Low, MD      Visit Date: 02/06/2021   Universal Protocol:    Date/Time: 11/01/225:11 AM  Consent Given By: the patient  Position: PRONE  Additional Comments: Vital signs were monitored before and after the procedure. Patient was prepped and draped in the usual sterile fashion. The correct patient, procedure, and site was verified.   Injection Procedure Details:    Procedure diagnoses: Cervical radiculopathy [M54.12]    Meds Administered:  Meds ordered this encounter  Medications   betamethasone acetate-betamethasone sodium phosphate (CELESTONE) injection 12 mg     Laterality: Right  Location/Site: C7-T1  Needle: 3.5 in., 20 ga. Tuohy  Needle Placement: Paramedian epidural space  Findings:  -Comments: Excellent flow of contrast into the epidural space.  Procedure Details: Using a paramedian approach from the side mentioned above, the region overlying the inferior lamina was localized under fluoroscopic visualization and the soft tissues overlying this structure were infiltrated with 4 ml. of 1% Lidocaine without Epinephrine. A # 20 gauge, Tuohy needle was inserted into the epidural space using a paramedian approach.  The epidural space was localized using loss of resistance along with contralateral oblique bi-planar fluoroscopic views.  After negative aspirate for air, blood, and CSF, a 2 ml. volume of Isovue-250 was injected into the epidural space and the flow of contrast was observed. Radiographs were obtained for documentation purposes.   The injectate was administered into the level noted above.  Additional Comments:  No complications occurred Dressing: 2 x 2 sterile gauze and Band-Aid    Post-procedure details: Patient was observed during the procedure. Post-procedure instructions were reviewed.  Patient left the clinic in stable condition.   Clinical History: MRI CERVICAL SPINE WITHOUT CONTRAST   TECHNIQUE: Multiplanar, multisequence MR imaging of the cervical spine was performed. No intravenous contrast was administered.   COMPARISON:  MRI dated 05/25/2007   FINDINGS: Alignment: Physiologic.   Vertebrae: No fracture, evidence of discitis, or bone lesion.   Cord: Normal signal and morphology.  Posterior Fossa, vertebral arteries, paraspinal tissues: 4 cm nodule in the left lobe of the thyroid gland. This has  increased from 3.4 cm in 2009. Bilateral thyroid nodules were biopsied at that time. Paraspinal soft tissues otherwise appear normal.   Disc levels:   C2-3: Negative.   C3-4: Tiny osteophytes to the left of midline with no neural impingement. Otherwise negative.   C4-5: Normal disc. Severe left facet arthritis, slightly progressed since the prior MRI. No foraminal stenosis.   C5-6: Broad-based soft disc protrusion, increased from a disc bulge on the prior study. Slight flattening of the ventral aspect of the spinal cord without myelopathy. Moderate right facet arthritis, new since the prior study. Moderate right foraminal stenosis   C6-7: Progressive degenerative disc disease since 2009 with a small broad-based disc bulge with a small protrusion and accompanying osteophytes extending into the right lateral recess and proximal right neural foramen with right foraminal stenosis, new since the prior study. Slight bilateral facet arthritis.   C7-T1: Negative.   IMPRESSION: 1. Progressive degenerative disc disease at C5-6 and C6-7 with right foraminal stenosis at C5-6 and C6-7 which could affect the right C6 and C7 nerves respectively. 2. Progressive facet arthritis at C4-5 on the left and at C5-6 on the right. 3. No findings to suggest cervical spinal infection. 4. 4 cm nodule in the left lobe of the thyroid gland. This has increased from 3.4 cm in 2009. This has been previously evaluated with biopsy in 2009.     Electronically Signed   By: Lorriane Shire M.D.   On: 01/05/2019 13:38     Objective:  VS:  HT:    WT:   BMI:     BP:   HR: bpm  TEMP: ( )  RESP:  Physical Exam Vitals and nursing note reviewed.  Constitutional:      General: She is not in acute distress.    Appearance: Normal appearance. She is not ill-appearing.  HENT:     Head: Normocephalic and atraumatic.     Right Ear: External ear normal.     Left Ear: External ear normal.  Eyes:      Extraocular Movements: Extraocular movements intact.  Cardiovascular:     Rate and Rhythm: Normal rate.     Pulses: Normal pulses.  Musculoskeletal:     Cervical back: Tenderness present. No rigidity.     Right lower leg: No edema.     Left lower leg: No edema.     Comments: Patient has good strength in the upper extremities including 5 out of 5 strength in wrist extension long finger flexion and APB.  There is no atrophy of the hands intrinsically.  There is a negative Hoffmann's test.   Lymphadenopathy:     Cervical: No cervical adenopathy.  Skin:    Findings: No erythema, lesion or rash.  Neurological:     General: No focal deficit present.     Mental Status: She is alert and oriented to person, place, and time.     Sensory: No sensory deficit.     Motor: No weakness or abnormal muscle tone.     Coordination: Coordination normal.  Psychiatric:        Mood and Affect: Mood normal.        Behavior: Behavior normal.     Imaging: No results found.

## 2021-02-20 NOTE — Procedures (Signed)
Cervical Epidural Steroid Injection - Interlaminar Approach with Fluoroscopic Guidance  Patient: [name]      Date of Birth: 03/02/1941 MRN: [id number] PCP: Wenda Low, MD      Visit Date: 02/06/2021   Universal Protocol:    Date/Time: 11/01/225:11 AM  Consent Given By: the patient  Position: PRONE  Additional Comments: Vital signs were monitored before and after the procedure. Patient was prepped and draped in the usual sterile fashion. The correct patient, procedure, and site was verified.   Injection Procedure Details:   Procedure diagnoses: Cervical radiculopathy [M54.12]    Meds Administered:  Meds ordered this encounter  Medications   betamethasone acetate-betamethasone sodium phosphate (CELESTONE) injection 12 mg     Laterality: Right  Location/Site: C7-T1  Needle: 3.5 in., 20 ga. Tuohy  Needle Placement: Paramedian epidural space  Findings:  -Comments: Excellent flow of contrast into the epidural space.  Procedure Details: Using a paramedian approach from the side mentioned above, the region overlying the inferior lamina was localized under fluoroscopic visualization and the soft tissues overlying this structure were infiltrated with 4 ml. of 1% Lidocaine without Epinephrine. A # 20 gauge, Tuohy needle was inserted into the epidural space using a paramedian approach.  The epidural space was localized using loss of resistance along with contralateral oblique bi-planar fluoroscopic views.  After negative aspirate for air, blood, and CSF, a 2 ml. volume of Isovue-250 was injected into the epidural space and the flow of contrast was observed. Radiographs were obtained for documentation purposes.   The injectate was administered into the level noted above.  Additional Comments:  No complications occurred Dressing: 2 x 2 sterile gauze and Band-Aid    Post-procedure details: Patient was observed during the procedure. Post-procedure instructions were  reviewed.  Patient left the clinic in stable condition.

## 2021-02-22 DIAGNOSIS — Z961 Presence of intraocular lens: Secondary | ICD-10-CM | POA: Diagnosis not present

## 2021-02-22 DIAGNOSIS — Z794 Long term (current) use of insulin: Secondary | ICD-10-CM | POA: Diagnosis not present

## 2021-02-22 DIAGNOSIS — H31011 Macula scars of posterior pole (postinflammatory) (post-traumatic), right eye: Secondary | ICD-10-CM | POA: Diagnosis not present

## 2021-02-22 DIAGNOSIS — E119 Type 2 diabetes mellitus without complications: Secondary | ICD-10-CM | POA: Diagnosis not present

## 2021-02-22 LAB — HM DIABETES EYE EXAM

## 2021-02-26 DIAGNOSIS — M542 Cervicalgia: Secondary | ICD-10-CM | POA: Diagnosis not present

## 2021-02-26 DIAGNOSIS — M5440 Lumbago with sciatica, unspecified side: Secondary | ICD-10-CM | POA: Diagnosis not present

## 2021-02-26 DIAGNOSIS — M6281 Muscle weakness (generalized): Secondary | ICD-10-CM | POA: Diagnosis not present

## 2021-02-27 ENCOUNTER — Ambulatory Visit (INDEPENDENT_AMBULATORY_CARE_PROVIDER_SITE_OTHER): Payer: Medicare PPO | Admitting: Physical Medicine and Rehabilitation

## 2021-02-27 ENCOUNTER — Other Ambulatory Visit: Payer: Self-pay

## 2021-02-27 ENCOUNTER — Encounter: Payer: Self-pay | Admitting: Physical Medicine and Rehabilitation

## 2021-02-27 VITALS — BP 117/77 | HR 67

## 2021-02-27 DIAGNOSIS — M542 Cervicalgia: Secondary | ICD-10-CM | POA: Diagnosis not present

## 2021-02-27 DIAGNOSIS — M47812 Spondylosis without myelopathy or radiculopathy, cervical region: Secondary | ICD-10-CM | POA: Diagnosis not present

## 2021-02-27 DIAGNOSIS — G894 Chronic pain syndrome: Secondary | ICD-10-CM

## 2021-02-27 DIAGNOSIS — M797 Fibromyalgia: Secondary | ICD-10-CM

## 2021-02-27 DIAGNOSIS — M7918 Myalgia, other site: Secondary | ICD-10-CM | POA: Diagnosis not present

## 2021-02-27 NOTE — Progress Notes (Signed)
[name] - 80 y.o. female MRN [id number]  Date of birth: October 01, 1940  Office Visit Note: Visit Date: 02/27/2021 PCP: Wenda Low, MD Referred by: Wenda Low, MD  Subjective: Chief Complaint  Patient presents with   Lower Back - Pain   Left Leg - Pain, Weakness   Right Leg - Pain, Weakness   Neck - Pain   Head - Pain   HPI: [name] is a 80 y.o. female who comes in today for evaluation of chronic, worsening and severe left-sided neck pain radiating to shoulder and up to head.  Patient had right C7-T1 interlaminar epidural steroid injection on 02/07/2021 which has given her significant and sustained relief of her right-sided symptoms however she reports a tightness and soreness sensation to the left side of her neck.  She states there is a very painful and tender area noted to her left shoulder.  Patient reports pain is exacerbated by movement and activity, describes as a soreness and tightness sensation, currently rates a 7 out of 10.  Patient states her pain tends to be worse in the mornings.  Patient states she currently resides at [facility] and does attend weekly physical therapy at this facility.  Patient states this does help to alleviate her pain.  Patient is currently ambulating with a cane to assist with balance and to prevent falls.  Patient denies focal weakness, numbness and tingling.  Patient denies recent trauma or falls.  Patient's course is complicated by fibromyalgia, anxiety and intolerance to multiple pain medications.  Review of Systems  Musculoskeletal:  Positive for myalgias and neck pain.  Neurological:  Negative for tingling, sensory change, focal weakness and weakness.  All other systems reviewed and are negative. Otherwise per HPI.  Assessment & Plan: Visit Diagnoses:    ICD-10-CM   1. Cervicalgia  M54.2     2. Cervical spondylosis without myelopathy  M47.812     3. Myofascial pain syndrome  M79.18     4. Fibromyalgia  M79.7      5. Chronic pain syndrome  G89.4        Plan: Findings:  Chronic, worsening and severe left-sided neck pain radiating to shoulder and up to head.  Patient did get good and sustained pain relief of right-sided radicular symptoms from recent right C7-T1 interlaminar epidural steroid injection.  Patient continues to have excruciating left-sided neck pain despite good conservative therapy such as formal physical therapy, rest and use of medications as needed.  Patient's clinical presentation and exam are consistent with myofascial pain.  We also feel that patient's history of fibromyalgia could be working to exacerbate her symptoms.  Tenderness noted upon exam today of trigger point to left levator scapula muscle.  We feel the next step is to perform trigger point injections, which we did complete in the office today without difficulty.  We encouraged patient to continue being active and participating in formal physical therapy at her facility as tolerated.  If trigger point injections helped patient significantly we will continue to monitor, however if her pain persist or becomes radicular in nature we would consider performing a left C7-T1 interlaminar epidural steroid injection.  Patient instructed to continue using cane to assist with ambulation and prevent falls.  Patient instructed to follow-up with [place] as needed.  No red flag symptoms noted upon exam today.   Meds & Orders: No orders of the defined types were placed in this encounter.   Orders Placed This Encounter  Procedures  Trigger Point Inj     Follow-up: Return if symptoms worsen or fail to improve.   Procedures: Trigger Point Inj  Date/Time: 02/27/2021 10:32 AM Performed by: Magnus Sinning, MD Authorized by: Magnus Sinning, MD   Consent Given by:  Patient Site marked: the procedure site was marked   Timeout: prior to procedure the correct patient, procedure, and site was verified   Total # of Trigger Points:  3 or  more Location: neck and back   Needle Size:  25 G Approach:  Dorsal Medications #1:  20 mg triamcinolone acetonide 40 MG/ML Medications #2:  3 mL lidocaine 1 % Additional Injections?: No   Patient tolerance:  Patient tolerated the procedure well with no immediate complications Comments: Trigger points palpated and injected with needling technique in the left levator scapula, trapezius and scalene.      Clinical History: MRI CERVICAL SPINE WITHOUT CONTRAST   TECHNIQUE: Multiplanar, multisequence MR imaging of the cervical spine was performed. No intravenous contrast was administered.   COMPARISON:  MRI dated 05/25/2007   FINDINGS: Alignment: Physiologic.   Vertebrae: No fracture, evidence of discitis, or bone lesion.   Cord: Normal signal and morphology.   Posterior Fossa, vertebral arteries, paraspinal tissues: 4 cm nodule in the left lobe of the thyroid gland. This has increased from 3.4 cm in 2009. Bilateral thyroid nodules were biopsied at that time. Paraspinal soft tissues otherwise appear normal.   Disc levels:   C2-3: Negative.   C3-4: Tiny osteophytes to the left of midline with no neural impingement. Otherwise negative.   C4-5: Normal disc. Severe left facet arthritis, slightly progressed since the prior MRI. No foraminal stenosis.   C5-6: Broad-based soft disc protrusion, increased from a disc bulge on the prior study. Slight flattening of the ventral aspect of the spinal cord without myelopathy. Moderate right facet arthritis, new since the prior study. Moderate right foraminal stenosis   C6-7: Progressive degenerative disc disease since 2009 with a small broad-based disc bulge with a small protrusion and accompanying osteophytes extending into the right lateral recess and proximal right neural foramen with right foraminal stenosis, new since the prior study. Slight bilateral facet arthritis.   C7-T1: Negative.   IMPRESSION: 1. Progressive  degenerative disc disease at C5-6 and C6-7 with right foraminal stenosis at C5-6 and C6-7 which could affect the right C6 and C7 nerves respectively. 2. Progressive facet arthritis at C4-5 on the left and at C5-6 on the right. 3. No findings to suggest cervical spinal infection. 4. 4 cm nodule in the left lobe of the thyroid gland. This has increased from 3.4 cm in 2009. This has been previously evaluated with biopsy in 2009.     Electronically Signed   By: Lorriane Shire M.D.   On: 01/05/2019 13:38   She reports that she quit smoking about 17 years ago. Her smoking use included cigarettes. She has a 3.00 pack-year smoking history. She has never used smokeless tobacco.  Recent Labs    06/12/20 1130 06/22/20 1154 01/11/21 1128  HGBA1C 6.8* 6.9* 7.0*    Objective:  VS:  HT:    WT:   BMI:     BP:117/77  HR:67bpm  TEMP: ( )  RESP:  Physical Exam Vitals reviewed.  HENT:     Head: Normocephalic and atraumatic.     Right Ear: External ear normal.     Left Ear: External ear normal.     Nose: Nose normal.     Mouth/Throat:  Mouth: Mucous membranes are moist.  Eyes:     Extraocular Movements: Extraocular movements intact.  Cardiovascular:     Rate and Rhythm: Normal rate.     Pulses: Normal pulses.  Pulmonary:     Effort: Pulmonary effort is normal.  Abdominal:     General: Abdomen is flat. There is no distension.  Musculoskeletal:        General: Tenderness present.     Cervical back: Tenderness present.     Comments: No discomfort noted with flexion, extension and side-to-side rotation. Patient has good strength in the upper extremities including 5 out of 5 strength in wrist extension, long finger flexion and APB.  There is no atrophy of the hands intrinsically.  Sensation intact bilaterally. Negative Hoffman's sign. Negative Spurling's sign.  Tenderness noted upon palpation of trigger point to left levator scapulae muscle.  Skin:    General: Skin is warm and dry.      Capillary Refill: Capillary refill takes less than 2 seconds.  Neurological:     Mental Status: She is alert and oriented to person, place, and time.     Gait: Gait abnormal.    Ortho Exam  Imaging: No results found.  Past Medical/Family/Surgical/Social History: Medications & Allergies reviewed per EMR, new medications updated. Patient Active Problem List   Diagnosis Date Noted   Failed total knee arthroplasty ([place]) 07/05/2020   Failed total right knee replacement ([place]) 07/05/2020   Diverticular disease of colon 05/18/2020   Gastroesophageal reflux disease 05/18/2020   Irritable bowel syndrome with diarrhea 05/18/2020   Status post total right knee replacement 08/13/2019   Unilateral primary osteoarthritis, right knee 05/24/2019   Fracture, proximal femur ([place]) 06/12/2018   Femur fracture, left ([place]) 06/12/2018   Acute blood loss as cause of postoperative anemia 03/30/2017   Diabetic polyneuropathy associated with secondary diabetes mellitus ([place]) 03/30/2017   Asthma 03/30/2017   Dyslipidemia 03/26/2017   Lumbar stenosis with neurogenic claudication 03/17/2017   Spinal stenosis of lumbar region 03/03/2017   Myoclonic jerking    Nausea    Acute encephalopathy 07/15/2016   Lumbar radiculopathy 06/27/2016   Hypothyroidism 06/26/2016   Labile blood glucose    Surgery, elective    Post-operative pain    Sickle cell trait ([place])    Acute blood loss anemia    History of back surgery    AKI (acute kidney injury) ([place])    Herniated nucleus pulposus, L2-3 06/25/2016   Bilateral primary osteoarthritis of knee 03/26/2016   Osteoarthritis, hand 03/26/2016   Other insomnia 03/26/2016   Fibromyalgia 09/27/2014   Nocturnal leg cramps 09/27/2014   Body mass index (BMI) of 30.0-30.9 in adult 08/04/2014   Neuropathy 03/10/2014   Allergic rhinitis 03/10/2014   Osteoporosis 03/10/2014   Spinal stenosis of lumbar region with radiculopathy 02/28/2014   Type 2 diabetes mellitus with  complication, with long-term current use of insulin ([place]) 07/08/2013   Hypokalemia 11/02/2012   Essential hypertension, benign 11/02/2012   Diabetes mellitus with neuropathy ([place]) 10/29/2012   Hyperlipidemia 10/29/2012   Spondylolisthesis of lumbar region 03/19/2011   Lumbar radicular pain 03/19/2011   Past Medical History:  Diagnosis Date   Acute blood loss anemia    Acute encephalopathy 07/15/2016   AKI (acute kidney injury) ([place])    Anemia    has sickle cell trait   Anxiety    Arthritis    Asthma    has used inhaler in past for asthmatic bronchitis, last time- early 2012   Bilateral  primary osteoarthritis of knee 24/11/2498   Complication of anesthesia    wakes up shaking   Diabetes mellitus    Diabetes mellitus with neuropathy ([place]) 10/29/2012   Diverticulitis    Dyslipidemia    Dyspnea    with exertion   Encephalopathy 06/2016   due to medications after surgery   Fibromyalgia    GERD (gastroesophageal reflux disease)    occas. use of  Prilosec   Heart murmur    sees Dr. Montez Morita, last seen- early 2012   Herniated nucleus pulposus, L2-3 06/25/2016   History of back surgery    Hypertension    02/2010- stress test /w PCP   Hypothyroidism    Loose bowel movements 12/2016   Lumbar radiculopathy 06/27/2016   Lumbar stenosis with neurogenic claudication 03/17/2017   Memory disorder 02/16/2016   Myoclonic jerking    Neuromuscular disorder ([facility])    lumbar radiculopathy, lumbago   Nocturnal leg cramps 09/27/2014   Osteoporosis 03/10/2014   Pneumonia    Sickle cell trait ([place])    Sleep apnea    borderline sleep apnea, states she no longer uses, early 2012- stopped using    Spinal stenosis of lumbar region with radiculopathy 02/28/2014   Spondylolisthesis of lumbar region 03/19/2011   Type II or unspecified type diabetes mellitus without mention of complication, uncontrolled 07/08/2013   Family History  Problem Relation Age of Onset   Ovarian cancer Mother    Cancer -  Prostate Father    Breast cancer Paternal Aunt    Multiple myeloma Paternal Aunt    Anesthesia problems Neg Hx    Hypotension Neg Hx    Malignant hyperthermia Neg Hx    Pseudochol deficiency Neg Hx    Past Surgical History:  Procedure Laterality Date   ABDOMINAL HYSTERECTOMY     adb.cyst     ovarian cyst   BACK SURGERY     2012, 2015 (3 total)   BACK SURGERY  2018   02/24/2017   COLONOSCOPY     EYE SURGERY     macular degeneration treatment - injections   FEMUR IM NAIL Left 06/13/2018   Procedure: INTRAMEDULLARY (IM) NAIL FEMORAL;  Surgeon: [name], MD;  Location: [facility];  Service: Orthopedics;  Laterality: Left;   OVARIAN CYST SURGERY     POSTERIOR LUMBAR FUSION 4 LEVEL N/A 03/17/2017   Procedure: Decompression of Lumbar One-Two with Thoracic Ten to Lumbar Two Fusion;  Surgeon: Kristeen Miss, MD;  Location: [facility];  Service: Neurosurgery;  Laterality: N/A;  Decompression of L1-2 with T10 to L2 Fusion   TOTAL KNEE ARTHROPLASTY Right 08/13/2019   Procedure: RIGHT TOTAL KNEE ARTHROPLASTY;  Surgeon: [name], MD;  Location: [facility];  Service: Orthopedics;  Laterality: Right;   TOTAL KNEE REVISION Right 07/05/2020   Procedure: TOTAL KNEE REVISION;  Surgeon: Gaynelle Arabian, MD;  Location: [facility];  Service: Orthopedics;  Laterality: Right;  152mn   Social History   Occupational History   Occupation: Retired  Tobacco Use   Smoking status: Former    Packs/day: 0.10    Years: 30.00    Pack years: 3.00    Types: Cigarettes    Quit date: 2005    Years since quitting: 17.8   Smokeless tobacco: Never  Vaping Use   Vaping Use: Never used  Substance and Sexual Activity   Alcohol use: Yes    Comment: wine /w dinner on occas.   Drug use: Never   Sexual activity: Never

## 2021-02-27 NOTE — Progress Notes (Signed)
Pt state lower back pain that travels to both leg. Pt state she feel weakness and the feeling that her legs or going to give away.  Pt has hx of inj on 02/06/21 pt state it didn't help has much it use to. Pt state she takes pain meds to help ease her pain and uses heat to help ease her pain.  Numeric Pain Rating Scale and Functional Assessment Average Pain 10 Pain Right Now 7 My pain is constant, sharp, dull, tingling, and aching Pain is worse with: walking, standing, and some activites Pain improves with: heat/ice, medication, and injections   In the last MONTH (on 0-10 scale) has pain interfered with the following?  1. General activity like being  able to carry out your everyday physical activities such as walking, climbing stairs, carrying groceries, or moving a chair?  Rating(7)  2. Relation with others like being able to carry out your usual social activities and roles such as  activities at home, at work and in your community. Rating(8)  3. Enjoyment of life such that you have  been bothered by emotional problems such as feeling anxious, depressed or irritable?  Rating(9)

## 2021-02-28 DIAGNOSIS — M5440 Lumbago with sciatica, unspecified side: Secondary | ICD-10-CM | POA: Diagnosis not present

## 2021-02-28 DIAGNOSIS — M6281 Muscle weakness (generalized): Secondary | ICD-10-CM | POA: Diagnosis not present

## 2021-02-28 DIAGNOSIS — M542 Cervicalgia: Secondary | ICD-10-CM | POA: Diagnosis not present

## 2021-03-01 MED ORDER — LIDOCAINE HCL 1 % IJ SOLN
3.0000 mL | INTRAMUSCULAR | Status: AC | PRN
Start: 2021-02-27 — End: 2021-02-27
  Administered 2021-02-27: 3 mL

## 2021-03-01 MED ORDER — TRIAMCINOLONE ACETONIDE 40 MG/ML IJ SUSP
20.0000 mg | INTRAMUSCULAR | Status: AC | PRN
  Administered 2021-02-27: 20 mg via INTRAMUSCULAR

## 2021-03-02 DIAGNOSIS — M542 Cervicalgia: Secondary | ICD-10-CM | POA: Diagnosis not present

## 2021-03-02 DIAGNOSIS — M6281 Muscle weakness (generalized): Secondary | ICD-10-CM | POA: Diagnosis not present

## 2021-03-02 DIAGNOSIS — M5440 Lumbago with sciatica, unspecified side: Secondary | ICD-10-CM | POA: Diagnosis not present

## 2021-03-05 ENCOUNTER — Encounter: Payer: Self-pay | Admitting: Endocrinology

## 2021-03-05 DIAGNOSIS — M6281 Muscle weakness (generalized): Secondary | ICD-10-CM | POA: Diagnosis not present

## 2021-03-05 DIAGNOSIS — M5440 Lumbago with sciatica, unspecified side: Secondary | ICD-10-CM | POA: Diagnosis not present

## 2021-03-05 DIAGNOSIS — M542 Cervicalgia: Secondary | ICD-10-CM | POA: Diagnosis not present

## 2021-03-07 DIAGNOSIS — M542 Cervicalgia: Secondary | ICD-10-CM | POA: Diagnosis not present

## 2021-03-07 DIAGNOSIS — M6281 Muscle weakness (generalized): Secondary | ICD-10-CM | POA: Diagnosis not present

## 2021-03-07 DIAGNOSIS — M5440 Lumbago with sciatica, unspecified side: Secondary | ICD-10-CM | POA: Diagnosis not present

## 2021-03-08 DIAGNOSIS — I10 Essential (primary) hypertension: Secondary | ICD-10-CM | POA: Diagnosis not present

## 2021-03-08 DIAGNOSIS — E1142 Type 2 diabetes mellitus with diabetic polyneuropathy: Secondary | ICD-10-CM | POA: Diagnosis not present

## 2021-03-08 DIAGNOSIS — E7800 Pure hypercholesterolemia, unspecified: Secondary | ICD-10-CM | POA: Diagnosis not present

## 2021-03-08 DIAGNOSIS — D573 Sickle-cell trait: Secondary | ICD-10-CM | POA: Diagnosis not present

## 2021-03-08 DIAGNOSIS — Z0000 Encounter for general adult medical examination without abnormal findings: Secondary | ICD-10-CM | POA: Diagnosis not present

## 2021-03-08 DIAGNOSIS — M519 Unspecified thoracic, thoracolumbar and lumbosacral intervertebral disc disorder: Secondary | ICD-10-CM | POA: Diagnosis not present

## 2021-03-08 DIAGNOSIS — M810 Age-related osteoporosis without current pathological fracture: Secondary | ICD-10-CM | POA: Diagnosis not present

## 2021-03-08 DIAGNOSIS — E039 Hypothyroidism, unspecified: Secondary | ICD-10-CM | POA: Diagnosis not present

## 2021-03-08 DIAGNOSIS — Z981 Arthrodesis status: Secondary | ICD-10-CM | POA: Diagnosis not present

## 2021-03-09 DIAGNOSIS — M6281 Muscle weakness (generalized): Secondary | ICD-10-CM | POA: Diagnosis not present

## 2021-03-09 DIAGNOSIS — M5440 Lumbago with sciatica, unspecified side: Secondary | ICD-10-CM | POA: Diagnosis not present

## 2021-03-09 DIAGNOSIS — M542 Cervicalgia: Secondary | ICD-10-CM | POA: Diagnosis not present

## 2021-03-12 ENCOUNTER — Telehealth: Payer: Self-pay | Admitting: Physical Medicine and Rehabilitation

## 2021-03-12 NOTE — Telephone Encounter (Signed)
Patient called advised she is going out of town tomorrow and asked if she can get a Rx for Oxycodone. Patient said she uses the [facility] at [address]. The number [phone]

## 2021-03-14 NOTE — Telephone Encounter (Signed)
Tried calling patient. No answer. LM advising per Dr Kennon Portela note below.

## 2021-03-23 DIAGNOSIS — M5440 Lumbago with sciatica, unspecified side: Secondary | ICD-10-CM | POA: Diagnosis not present

## 2021-03-23 DIAGNOSIS — M542 Cervicalgia: Secondary | ICD-10-CM | POA: Diagnosis not present

## 2021-03-23 DIAGNOSIS — M6281 Muscle weakness (generalized): Secondary | ICD-10-CM | POA: Diagnosis not present

## 2021-03-26 DIAGNOSIS — M542 Cervicalgia: Secondary | ICD-10-CM | POA: Diagnosis not present

## 2021-03-26 DIAGNOSIS — M6281 Muscle weakness (generalized): Secondary | ICD-10-CM | POA: Diagnosis not present

## 2021-03-26 DIAGNOSIS — M5440 Lumbago with sciatica, unspecified side: Secondary | ICD-10-CM | POA: Diagnosis not present

## 2021-03-29 ENCOUNTER — Other Ambulatory Visit (HOSPITAL_COMMUNITY): Payer: Self-pay

## 2021-03-29 ENCOUNTER — Telehealth: Payer: Self-pay

## 2021-03-29 NOTE — Telephone Encounter (Signed)
Patient Advocate Encounter   Received notification from [facility] that prior authorization for Repatha 420mg /3.37ml is required by his/her insurance [employer].  PA submitted on 03/29/2021  Key#: [id number]  Status is pending    [facility] will continue to follow:  Patient Advocate Fax:  [phone]

## 2021-03-30 ENCOUNTER — Other Ambulatory Visit (HOSPITAL_COMMUNITY): Payer: Self-pay

## 2021-04-02 ENCOUNTER — Other Ambulatory Visit (HOSPITAL_COMMUNITY): Payer: Self-pay

## 2021-04-02 DIAGNOSIS — M542 Cervicalgia: Secondary | ICD-10-CM | POA: Diagnosis not present

## 2021-04-02 DIAGNOSIS — M6281 Muscle weakness (generalized): Secondary | ICD-10-CM | POA: Diagnosis not present

## 2021-04-02 DIAGNOSIS — E1165 Type 2 diabetes mellitus with hyperglycemia: Secondary | ICD-10-CM | POA: Diagnosis not present

## 2021-04-02 DIAGNOSIS — M5440 Lumbago with sciatica, unspecified side: Secondary | ICD-10-CM | POA: Diagnosis not present

## 2021-04-02 NOTE — Telephone Encounter (Signed)
Patient Advocate Encounter  Prior Authorization for Repatha has been approved.    PA# PA Case: [id number] Effective dates:  04/22/20 through 04/21/22  Per Test Claim: Med has already been filled today.  Patient Advocate Fax:  [phone]

## 2021-04-09 ENCOUNTER — Other Ambulatory Visit (HOSPITAL_COMMUNITY): Payer: Self-pay

## 2021-04-10 ENCOUNTER — Other Ambulatory Visit (INDEPENDENT_AMBULATORY_CARE_PROVIDER_SITE_OTHER): Payer: Medicare PPO

## 2021-04-10 ENCOUNTER — Other Ambulatory Visit: Payer: Self-pay

## 2021-04-10 DIAGNOSIS — E063 Autoimmune thyroiditis: Secondary | ICD-10-CM

## 2021-04-10 DIAGNOSIS — E7800 Pure hypercholesterolemia, unspecified: Secondary | ICD-10-CM

## 2021-04-10 DIAGNOSIS — Z794 Long term (current) use of insulin: Secondary | ICD-10-CM | POA: Diagnosis not present

## 2021-04-10 DIAGNOSIS — E1165 Type 2 diabetes mellitus with hyperglycemia: Secondary | ICD-10-CM

## 2021-04-10 LAB — COMPREHENSIVE METABOLIC PANEL
ALT: 8 U/L (ref 0–35)
AST: 14 U/L (ref 0–37)
Albumin: 4 g/dL (ref 3.5–5.2)
Alkaline Phosphatase: 54 U/L (ref 39–117)
BUN: 18 mg/dL (ref 6–23)
CO2: 30 mEq/L (ref 19–32)
Calcium: 9.8 mg/dL (ref 8.4–10.5)
Chloride: 105 mEq/L (ref 96–112)
Creatinine, Ser: 1.02 mg/dL (ref 0.40–1.20)
GFR: 51.66 mL/min — ABNORMAL LOW (ref >60.00)
Glucose, Bld: 90 mg/dL (ref 70–99)
Potassium: 3.8 mEq/L (ref 3.5–5.1)
Sodium: 143 mEq/L (ref 135–145)
Total Bilirubin: 0.7 mg/dL (ref 0.2–1.2)
Total Protein: 6.5 g/dL (ref 6.0–8.3)

## 2021-04-10 LAB — LIPID PANEL
Cholesterol: 240 mg/dL — ABNORMAL HIGH (ref 0–200)
HDL: 48.9 mg/dL (ref >39.00)
LDL Cholesterol: 162 mg/dL — ABNORMAL HIGH (ref 0–99)
NonHDL: 191.58
Total CHOL/HDL Ratio: 5
Triglycerides: 150 mg/dL — ABNORMAL HIGH (ref 0.0–149.0)
VLDL: 30 mg/dL (ref 0.0–40.0)

## 2021-04-10 LAB — TSH: TSH: 4.1 u[IU]/mL (ref 0.35–5.50)

## 2021-04-10 LAB — MICROALBUMIN / CREATININE URINE RATIO
Creatinine,U: 61.9 mg/dL
Microalb Creat Ratio: 3.8 mg/g (ref 0.0–30.0)
Microalb, Ur: 2.4 mg/dL — ABNORMAL HIGH (ref 0.0–1.9)

## 2021-04-10 LAB — HEMOGLOBIN A1C: Hgb A1c MFr Bld: 6.7 % — ABNORMAL HIGH (ref 4.6–6.5)

## 2021-04-17 ENCOUNTER — Other Ambulatory Visit: Payer: Medicare PPO

## 2021-04-19 ENCOUNTER — Encounter: Payer: Self-pay | Admitting: Endocrinology

## 2021-04-19 ENCOUNTER — Other Ambulatory Visit: Payer: Self-pay | Admitting: Endocrinology

## 2021-04-19 ENCOUNTER — Ambulatory Visit (INDEPENDENT_AMBULATORY_CARE_PROVIDER_SITE_OTHER): Payer: Medicare PPO | Admitting: Endocrinology

## 2021-04-19 ENCOUNTER — Other Ambulatory Visit: Payer: Self-pay

## 2021-04-19 VITALS — BP 128/72 | HR 63 | Ht 63.0 in | Wt 183.2 lb

## 2021-04-19 DIAGNOSIS — E063 Autoimmune thyroiditis: Secondary | ICD-10-CM

## 2021-04-19 DIAGNOSIS — I10 Essential (primary) hypertension: Secondary | ICD-10-CM | POA: Diagnosis not present

## 2021-04-19 DIAGNOSIS — E1165 Type 2 diabetes mellitus with hyperglycemia: Secondary | ICD-10-CM

## 2021-04-19 DIAGNOSIS — Z794 Long term (current) use of insulin: Secondary | ICD-10-CM

## 2021-04-19 DIAGNOSIS — E7800 Pure hypercholesterolemia, unspecified: Secondary | ICD-10-CM

## 2021-04-19 MED ORDER — DEXCOM G6 TRANSMITTER MISC: 1.0000 | Freq: Once | 1 refills | Status: AC

## 2021-04-19 MED ORDER — DEXCOM G6 SENSOR MISC: 3 refills | Status: DC

## 2021-04-19 NOTE — Progress Notes (Signed)
Patient ID: [name], female   DOB: 02/18/1941, 80 y.o.   MRN: [id number]      Reason for Appointment: Endocrinology follow-up  History of Present Illness    PROBLEM 1: Type 2 diabetes mellitus, date of diagnosis: 1998.   Prior history: She had previously been treated with Byetta, Glumetza, Amaryl, Victoza and  Onglyza However because of inadequate control and intolerance to drugs she was finally given pre-meal insulin along with Amaryl, Glumetza eventually stopped because of side effects and also Lantus added  Recent history:  The insulin regimen is: Novolog 6 at breakfast and 10 lunch, 9/10 acs Tresiba 22 units daily  Oral agents: Farxiga 5 mg daily  Her A1c is 6.7  A1c previous range 5.9--7.4  Current blood sugar patterns and problems with management:  Has not been checking readings at the same time as her libre with her Accu-Chek at times and finds that the [place] is reading 20 to 30 mg lower frequently Is asking about getting a Dexcom sensor but not clear if this is covered Recently has been quite regular with taking her insulin and getting her supplies Overall blood sugars are excellent with only sporadic high readings after one of her meals especially dinner but this is mostly when she is going off her diet or eating sweets At times will have only a smaller meal at dinnertime and less carbohydrates causing her sugars to be low normal postprandially However no significant hypoglycemia Although fasting blood sugars are low normal or reported low on her sensor she did not think she has hypoglycemia and fasting lab glucose was 90 She says she is trying to do some walking with her dog regularly However she thinks she has gained weight because of eating out over the holidays She wants to try Ozempic   Side effects from diabetes medications: Victoza caused nausea.  Metformin  Currently using freestyle libre 2  Blood sugar data as follows, Elenor Legato is reading  falsely low  CGM use % of time   2-week average/GV 116  Time in range 91  % Time Above 180 6  % Time above 250   % Time Below 70 3     PRE-MEAL Fasting Lunch Dinner Bedtime Overall  Glucose range:       Averages: 85 120 107 117    POST-MEAL PC Breakfast PC Lunch PC Dinner  Glucose range:     Averages: 96 124 147     Prior  CGM use % of time 68  2-week average/GV 134  Time in range    87    %  % Time Above 180 13  % Time above 250   % Time Below 70 0     PRE-MEAL Fasting Lunch Dinner Bedtime Overall  Glucose range:       Averages: 112 130 116  134   POST-MEAL PC Breakfast PC Lunch PC Dinner  Glucose range:     Averages: 132 440 102     Certified Diabetes Educator visit: Most recent: 7/13.  Dietician visit: Most recent: 3/13.   Wt Readings from Last 3 Encounters:  04/19/21 183 lb 3.2 oz (83.1 kg)  01/15/21 181 lb 6.4 oz (82.3 kg)  10/05/20 183 lb 3.2 oz (72.5 kg)   Complications: Neuropathy  LABS:  Lab Results  Component Value Date   HGBA1C 6.7 (H) 04/10/2021   HGBA1C 7.0 (H) 01/11/2021   HGBA1C 6.9 (H) 06/22/2020   Lab Results  Component Value Date  MICROALBUR 2.4 (H) 04/10/2021   LDLCALC 162 (H) 04/10/2021   CREATININE 1.02 04/10/2021   Lab Results  Component Value Date   FRUCTOSAMINE 283 06/09/2018   FRUCTOSAMINE 296 (H) 05/16/2017   FRUCTOSAMINE 322 (H) 10/10/2016    Multiple other issues are addressed in review of systems  No visits with results within 1 Week(s) from this visit.  Latest known visit with results is:  Lab on 04/10/2021  Component Date Value Ref Range Status   TSH 04/10/2021 4.10  0.35 - 5.50 uIU/mL Final   Cholesterol 04/10/2021 240 (H)  0 - 200 mg/dL Final   ATP III Classification       Desirable:  < 200 mg/dL               Borderline High:  200 - 239 mg/dL          High:  > = 240 mg/dL   Triglycerides 04/10/2021 150.0 (H)  0.0 - 149.0 mg/dL Final   Normal:  <150 mg/dLBorderline High:  150 - 199 mg/dL   HDL  04/10/2021 48.90  >39.00 mg/dL Final   VLDL 04/10/2021 30.0  0.0 - 40.0 mg/dL Final   LDL Cholesterol 04/10/2021 162 (H)  0 - 99 mg/dL Final   Total CHOL/HDL Ratio 04/10/2021 5   Final                  Men          Women1/2 Average Risk     3.4          3.3Average Risk          5.0          4.42X Average Risk          9.6          7.13X Average Risk          15.0          11.0                       NonHDL 04/10/2021 191.58   Final   NOTE:  Non-HDL goal should be 30 mg/dL higher than patient's LDL goal (i.e. LDL goal of < 70 mg/dL, would have non-HDL goal of < 100 mg/dL)   Microalb, Ur 04/10/2021 2.4 (H)  0.0 - 1.9 mg/dL Final   Creatinine,U 04/10/2021 61.9  mg/dL Final   Microalb Creat Ratio 04/10/2021 3.8  0.0 - 30.0 mg/g Final   Sodium 04/10/2021 143  135 - 145 mEq/L Final   Potassium 04/10/2021 3.8  3.5 - 5.1 mEq/L Final   Chloride 04/10/2021 105  96 - 112 mEq/L Final   CO2 04/10/2021 30  19 - 32 mEq/L Final   Glucose, Bld 04/10/2021 90  70 - 99 mg/dL Final   BUN 04/10/2021 18  6 - 23 mg/dL Final   Creatinine, Ser 04/10/2021 1.02  0.40 - 1.20 mg/dL Final   Total Bilirubin 04/10/2021 0.7  0.2 - 1.2 mg/dL Final   Alkaline Phosphatase 04/10/2021 54  39 - 117 U/L Final   AST 04/10/2021 14  0 - 37 U/L Final   ALT 04/10/2021 8  0 - 35 U/L Final   Total Protein 04/10/2021 6.5  6.0 - 8.3 g/dL Final   Albumin 04/10/2021 4.0  3.5 - 5.2 g/dL Final   GFR 04/10/2021 51.66 (L)  >60.00 mL/min Final   Calculated using the CKD-EPI Creatinine Equation (2021)   Calcium 04/10/2021 9.8  8.4 -  10.5 mg/dL Final   Hgb A1c MFr Bld 04/10/2021 6.7 (H)  4.6 - 6.5 % Final   Glycemic Control Guidelines for People with Diabetes:Non Diabetic:  <6%Goal of Therapy: <7%Additional Action Suggested:  >8%       Allergies as of 04/19/2021       Reactions   Cymbalta [duloxetine Hcl] Diarrhea, Other (See Comments)   Dizziness, headache, irritability   Amlodipine Besylate Swelling   Baclofen    jerks    Betadine  [povidone Iodine] Swelling, Other (See Comments)   SWELLING REACTION DESCRIPTION/SEVERITY UNSPECIFIED  Reaction to betadine on eye lid and it swelled up   Erythromycin Base Other (See Comments)   unknown   Oxycodone Hcl    Percocet [oxycodone-acetaminophen] Other (See Comments)   Confusion change personality   Povidone-iodine Swelling   Soap Other (See Comments)   unknown   Statins Other (See Comments)   Muscle cramps   Adhesive [tape] Rash   Lipitor [atorvastatin] Rash        Medication List        Accurate as of April 19, 2021 10:40 AM. If you have any questions, ask your nurse or doctor.          Accu-Chek Aviva Plus test strip Generic drug: glucose blood TEST BLOOD SUGAR FOUR TIMES DAILY AS DIRECTED   Accu-Chek Aviva Plus w/Device Kit Use Accu Chek Aviva as instructed to check blood sugar 4 times daily.   acetaminophen 500 MG tablet Commonly known as: TYLENOL Take 1,000 mg by mouth every 8 (eight) hours as needed for mild pain or moderate pain.   Bystolic 5 MG tablet Generic drug: nebivolol Take 5 mg by mouth at bedtime. Nebivolol   Cal-Mag-Zinc-D Tabs Take 1 tablet by mouth daily. 330 mg /130 mg/ 5 mg /5 mg   cetirizine 10 MG tablet Commonly known as: ZYRTEC Take 1 tablet (10 mg total) by mouth daily as needed for allergies. What changed: when to take this   Droplet Pen Needles 32G X 4 MM Misc Generic drug: Insulin Pen Needle INJECT  INSULIN FOUR TIMES DAILY   Farxiga 5 MG Tabs tablet Generic drug: dapagliflozin propanediol TAKE 1 TABLET EVERY DAY   fluticasone 50 MCG/ACT nasal spray Commonly known as: FLONASE Place 1 spray into both nostrils daily as needed for allergies. What changed:  how much to take when to take this   gabapentin 600 MG tablet Commonly known as: NEURONTIN TAKE 1 TABLET(600 MG) BY MOUTH THREE TIMES DAILY What changed: See the new instructions.   irbesartan-hydrochlorothiazide 150-12.5 MG tablet Commonly known as:  AVALIDE Take 1 tablet by mouth daily.   ketoconazole 2 % cream Commonly known as: NIZORAL Apply 1 application topically daily as needed (Itching).   levothyroxine 50 MCG tablet Commonly known as: SYNTHROID TAKE 1 TABLET(50 MCG) BY MOUTH DAILY   NovoLOG FlexPen 100 UNIT/ML FlexPen Generic drug: insulin aspart INJECT 6 UNITS BEFORE BREAKFAST, 9 UNITS AT LUNCH, 8-12 UNITS AT DINNER, 3-4 UNITS AS NEEDED FOR LARGE CARBS OR ICE CREAM   Repatha Pushtronex System 420 MG/3.5ML Soct Generic drug: Evolocumab with Infusor Inject 420 mg into the skin every 30 (thirty) days.   Symbicort 80-4.5 MCG/ACT inhaler Generic drug: budesonide-formoterol Inhale 2 puffs into the lungs 2 (two) times daily as needed (shortness of breath). What changed: when to take this   [place] FlexTouch 100 UNIT/ML FlexTouch Pen Generic drug: insulin degludec Inject 24 Units into the skin daily.        Allergies:  Allergies  Allergen Reactions   Cymbalta [Duloxetine Hcl] Diarrhea and Other (See Comments)    Dizziness, headache, irritability   Amlodipine Besylate Swelling   Baclofen     jerks    Betadine [Povidone Iodine] Swelling and Other (See Comments)    SWELLING REACTION DESCRIPTION/SEVERITY UNSPECIFIED  Reaction to betadine on eye lid and it swelled up   Erythromycin Base Other (See Comments)    unknown   Oxycodone Hcl    Percocet [Oxycodone-Acetaminophen] Other (See Comments)    Confusion change personality   Povidone-Iodine Swelling   Soap Other (See Comments)    unknown   Statins Other (See Comments)    Muscle cramps   Adhesive [Tape] Rash   Lipitor [Atorvastatin] Rash    Past Medical History:  Diagnosis Date   Acute blood loss anemia    Acute encephalopathy 07/15/2016   AKI (acute kidney injury) ([place])    Anemia    has sickle cell trait   Anxiety    Arthritis    Asthma    has used inhaler in past for asthmatic bronchitis, last time- early 2012   Bilateral primary osteoarthritis of  knee 76/10/2092   Complication of anesthesia    wakes up shaking   Diabetes mellitus    Diabetes mellitus with neuropathy ([place]) 10/29/2012   Diverticulitis    Dyslipidemia    Dyspnea    with exertion   Encephalopathy 06/2016   due to medications after surgery   Fibromyalgia    GERD (gastroesophageal reflux disease)    occas. use of  Prilosec   Heart murmur    sees Dr. Montez Morita, last seen- early 2012   Herniated nucleus pulposus, L2-3 06/25/2016   History of back surgery    Hypertension    02/2010- stress test /w PCP   Hypothyroidism    Loose bowel movements 12/2016   Lumbar radiculopathy 06/27/2016   Lumbar stenosis with neurogenic claudication 03/17/2017   Memory disorder 02/16/2016   Myoclonic jerking    Neuromuscular disorder ([facility])    lumbar radiculopathy, lumbago   Nocturnal leg cramps 09/27/2014   Osteoporosis 03/10/2014   Pneumonia    Sickle cell trait ([place])    Sleep apnea    borderline sleep apnea, states she no longer uses, early 2012- stopped using    Spinal stenosis of lumbar region with radiculopathy 02/28/2014   Spondylolisthesis of lumbar region 03/19/2011   Type II or unspecified type diabetes mellitus without mention of complication, uncontrolled 07/08/2013    Past Surgical History:  Procedure Laterality Date   ABDOMINAL HYSTERECTOMY     adb.cyst     ovarian cyst   BACK SURGERY     2012, 2015 (3 total)   BACK SURGERY  2018   02/24/2017   COLONOSCOPY     EYE SURGERY     macular degeneration treatment - injections   FEMUR IM NAIL Left 06/13/2018   Procedure: INTRAMEDULLARY (IM) NAIL FEMORAL;  Surgeon: [name], MD;  Location: [facility];  Service: Orthopedics;  Laterality: Left;   OVARIAN CYST SURGERY     POSTERIOR LUMBAR FUSION 4 LEVEL N/A 03/17/2017   Procedure: Decompression of Lumbar One-Two with Thoracic Ten to Lumbar Two Fusion;  Surgeon: Kristeen Miss, MD;  Location: [facility];  Service: Neurosurgery;  Laterality: N/A;  Decompression of L1-2 with  T10 to L2 Fusion   TOTAL KNEE ARTHROPLASTY Right 08/13/2019   Procedure: RIGHT TOTAL KNEE ARTHROPLASTY;  Surgeon: [name], MD;  Location: [facility];  Service:  Orthopedics;  Laterality: Right;   TOTAL KNEE REVISION Right 07/05/2020   Procedure: TOTAL KNEE REVISION;  Surgeon: Gaynelle Arabian, MD;  Location: [facility];  Service: Orthopedics;  Laterality: Right;  159mn    Family History  Problem Relation Age of Onset   Ovarian cancer Mother    Cancer - Prostate Father    Breast cancer Paternal Aunt    Multiple myeloma Paternal Aunt    Anesthesia problems Neg Hx    Hypotension Neg Hx    Malignant hyperthermia Neg Hx    Pseudochol deficiency Neg Hx     Social History:  reports that she quit smoking about 18 years ago. Her smoking use included cigarettes. She has a 3.00 pack-year smoking history. She has never used smokeless tobacco. She reports current alcohol use. She reports that she does not use drugs.  ROS    NEUROPATHY: She has had tingling in feet and legs, mostly at night. She is taking gabapentin   Hyperlipidemia:   The lipid abnormality consists of elevated LDL persistently Did not tolerate Crestor or lovastatin because of muscle cramps  She stopped Zetia presumably because she was having muscle cramps  She was started on Repatha in 2021  Previously when taking this regularly she had good results but on the last time she says she was forgetting to take it regularly She no side effects but until recently has not had regular supplies of this  She was having some pain with the injection and is using the monthly pump device  LDL is higher recently because of running out of her medication but she does have it now  LDL results as follows:  Lab Results  Component Value Date   CHOL 240 (H) 04/10/2021   CHOL 199 01/11/2021   CHOL 235 (H) 09/21/2020   Lab Results  Component Value Date   HDL 48.90 04/10/2021   HDL 47.80 01/11/2021   HDL 48.40 09/21/2020   Lab  Results  Component Value Date   LDLCALC 162 (H) 04/10/2021   LDLCALC 126 (H) 01/11/2021   LDLCALC 149 (H) 09/21/2020   Lab Results  Component Value Date   TRIG 150.0 (H) 04/10/2021   TRIG 126.0 01/11/2021   TRIG 184.0 (H) 09/21/2020   Lab Results  Component Value Date   CHOLHDL 5 04/10/2021   CHOLHDL 4 01/11/2021   CHOLHDL 5 09/21/2020   Lab Results  Component Value Date   LDLDIRECT 166.0 02/09/2019   LDLDIRECT 115.0 12/18/2016   LDLDIRECT 134.0 10/10/2016      HYPERTENSION:  Has been present for several years.   she is on Avalide 150/12.5 mg,  Also on Bystolic from PCP  Avalide was decreased on the last visit because of low normal blood pressure and higher creatinine  BP Readings from Last 3 Encounters:  04/19/21 128/72  02/27/21 117/77  01/15/21 112/64   . Has a history of leg and muscle cramps and takes magnesium supplements twice a day  No recent hypokalemia or change in renal function  Lab Results  Component Value Date   CREATININE 1.02 04/10/2021   CREATININE 1.23 (H) 01/11/2021   CREATININE 1.00 11/08/2020     Lab Results  Component Value Date   K 3.8 04/10/2021      Hypothyroidism  She had a relatively high TSH as of 4/17 and was empirically given Synthroid 25 g Subsequently has been on 50 mcg which she takes regularly  No unusual fatigue recently TSH history as follows   Lab  Results  Component Value Date   TSH 4.10 04/10/2021   TSH 2.36 01/11/2021   TSH 1.56 04/07/2020   FREET4 1.06 09/14/2019   FREET4 0.88 02/09/2019   FREET4 0.97 10/14/2018         Examination:   BP 128/72    Pulse 63    Ht '5\' 3"'  (1.6 m)    Wt 183 lb 3.2 oz (83.1 kg)    LMP  (LMP Unknown)    SpO2 96%    BMI 32.45 kg/m   Body mass index is 32.45 kg/m.     Assesment/Plan:   1. DIABETES type 2 on insulin  See history of present illness for detailed discussion of management, blood sugar patterns and problems identified  She is on basal bolus insulin and  Farxiga 5 mg daily  A1c is better at 6.7 %  However she now appears to have falsely low readings on her freestyle libre  Overall blood sugars are fairly good with an average no significant postprandial hyperglycemia except occasionally after dinner However she does need to adjust her mealtime dose better with paying attention to portion size and carbohydrate intake especially at dinnertime Fasting readings are appearing low normal but not consistently  She will see if she can be eligible for the Dexcom sensor Otherwise since she is wanting to try the Ozempic we will give her a sample of this starting with 0.25 mg weekly Reminded her that she previously had nausea from [place] and she may have the same side effect with this  Explained to the patient what GLP-1 drugs are, the sites of actions and the body, reduction of hunger sensation and improved insulin secretion.  Discussed the benefit of weight loss with these medications. Explained possible side effects of OZEMPIC, most commonly nausea that usually improves over time.  Also explained safety information associated with the medication Demonstrated the medication injection device and injection technique to the patient.  To start the injections with the 0.25 mg dosage weekly for the first 4 injections and then go up to 0.5 mg weekly if no nausea  TRESIBA will be reduced to 20 units for now and may need significant reduction in all her doses if blood sugars go progressively lower with starting Ozempic  Patient brochure on Ozempic with sample given, she will call for prescription when she is on the last dose  3. Hypertension: Blood pressure is normal today Also creatinine is slightly better with reducing her Avalide to 150 instead of 300    4.   Hypothyroidism: She is taking 50 mcg levothyroxine with normal TSH   LIPIDS: She has not been able to take Repatha which she now does have a supply and is willing to take this regularly as long as  her new insurance covers it  LDL has been higher as expected   There are no Patient Instructions on file for this visit.      [name] 04/19/2021, 10:40 AM    Note: This office note was prepared with Dragon voice recognition system technology. Any transcriptional errors that result from this process are unintentional.

## 2021-04-19 NOTE — Patient Instructions (Signed)
Tresiba 20 units daily  Start OZEMPIC injections by dialing 0.25 mg on the pen as shown once weekly on the same day of the week.   You may inject in the sides of the stomach, outer thigh or arm as indicated in the brochure given. If you have any difficulties using the pen see the video at [email or web address]  You will feel fullness of the stomach with starting the medication and should try to keep the portions at meals small.  You may experience nausea in the first few days which usually gets better over time    After 4 weeks increase the dose to 0.5 mg weekly  If you have any questions or persistent side effects please call the office   You may also talk to a nurse educator with [employer] at [phone] Useful website: [email or web address]

## 2021-04-21 ENCOUNTER — Other Ambulatory Visit: Payer: Self-pay

## 2021-04-21 DIAGNOSIS — E1165 Type 2 diabetes mellitus with hyperglycemia: Secondary | ICD-10-CM

## 2021-04-21 DIAGNOSIS — Z794 Long term (current) use of insulin: Secondary | ICD-10-CM

## 2021-04-21 MED ORDER — IRBESARTAN-HYDROCHLOROTHIAZIDE 150-12.5 MG PO TABS: 1.0000 | ORAL_TABLET | Freq: Every day | ORAL | 3 refills | Status: DC

## 2021-04-22 ENCOUNTER — Other Ambulatory Visit: Payer: Self-pay | Admitting: Endocrinology

## 2021-04-22 DIAGNOSIS — Z794 Long term (current) use of insulin: Secondary | ICD-10-CM

## 2021-04-22 DIAGNOSIS — E1165 Type 2 diabetes mellitus with hyperglycemia: Secondary | ICD-10-CM

## 2021-05-01 ENCOUNTER — Encounter: Payer: Self-pay | Admitting: Endocrinology

## 2021-05-01 DIAGNOSIS — I10 Essential (primary) hypertension: Secondary | ICD-10-CM

## 2021-05-01 DIAGNOSIS — E063 Autoimmune thyroiditis: Secondary | ICD-10-CM

## 2021-05-01 DIAGNOSIS — Z794 Long term (current) use of insulin: Secondary | ICD-10-CM

## 2021-05-01 DIAGNOSIS — E1165 Type 2 diabetes mellitus with hyperglycemia: Secondary | ICD-10-CM

## 2021-05-02 ENCOUNTER — Encounter (HOSPITAL_BASED_OUTPATIENT_CLINIC_OR_DEPARTMENT_OTHER): Payer: Self-pay | Admitting: Emergency Medicine

## 2021-05-02 ENCOUNTER — Other Ambulatory Visit (HOSPITAL_BASED_OUTPATIENT_CLINIC_OR_DEPARTMENT_OTHER): Payer: Self-pay

## 2021-05-02 ENCOUNTER — Emergency Department (HOSPITAL_BASED_OUTPATIENT_CLINIC_OR_DEPARTMENT_OTHER): Payer: Medicare (Managed Care)

## 2021-05-02 ENCOUNTER — Other Ambulatory Visit: Payer: Self-pay

## 2021-05-02 ENCOUNTER — Emergency Department (HOSPITAL_BASED_OUTPATIENT_CLINIC_OR_DEPARTMENT_OTHER)
Admission: EM | Admit: 2021-05-02 | Discharge: 2021-05-02 | Disposition: A | Discharge status: Home / Self Care | Payer: Medicare (Managed Care) | Attending: Emergency Medicine | Admitting: Emergency Medicine

## 2021-05-02 DIAGNOSIS — Z794 Long term (current) use of insulin: Secondary | ICD-10-CM | POA: Diagnosis not present

## 2021-05-02 DIAGNOSIS — R350 Frequency of micturition: Secondary | ICD-10-CM | POA: Diagnosis not present

## 2021-05-02 DIAGNOSIS — R197 Diarrhea, unspecified: Secondary | ICD-10-CM | POA: Diagnosis not present

## 2021-05-02 DIAGNOSIS — Z7984 Long term (current) use of oral hypoglycemic drugs: Secondary | ICD-10-CM | POA: Diagnosis not present

## 2021-05-02 DIAGNOSIS — E876 Hypokalemia: Secondary | ICD-10-CM | POA: Insufficient documentation

## 2021-05-02 DIAGNOSIS — R1013 Epigastric pain: Secondary | ICD-10-CM | POA: Diagnosis not present

## 2021-05-02 DIAGNOSIS — R6883 Chills (without fever): Secondary | ICD-10-CM | POA: Insufficient documentation

## 2021-05-02 DIAGNOSIS — K5900 Constipation, unspecified: Secondary | ICD-10-CM | POA: Insufficient documentation

## 2021-05-02 DIAGNOSIS — E119 Type 2 diabetes mellitus without complications: Secondary | ICD-10-CM | POA: Insufficient documentation

## 2021-05-02 DIAGNOSIS — R112 Nausea with vomiting, unspecified: Secondary | ICD-10-CM | POA: Insufficient documentation

## 2021-05-02 DIAGNOSIS — R110 Nausea: Secondary | ICD-10-CM

## 2021-05-02 DIAGNOSIS — M545 Low back pain, unspecified: Secondary | ICD-10-CM | POA: Diagnosis not present

## 2021-05-02 LAB — URINALYSIS, ROUTINE W REFLEX MICROSCOPIC
Bilirubin Urine: NEGATIVE
Glucose, UA: NEGATIVE mg/dL
Hgb urine dipstick: NEGATIVE
Ketones, ur: 15 mg/dL — AB
Leukocytes,Ua: NEGATIVE
Nitrite: NEGATIVE
Protein, ur: NEGATIVE mg/dL
Specific Gravity, Urine: 1.02 (ref 1.005–1.030)
pH: 6.5 (ref 5.0–8.0)

## 2021-05-02 LAB — COMPREHENSIVE METABOLIC PANEL
ALT: 7 U/L (ref 0–44)
AST: 12 U/L — ABNORMAL LOW (ref 15–41)
Albumin: 4.4 g/dL (ref 3.5–5.0)
Alkaline Phosphatase: 44 U/L (ref 38–126)
Anion gap: 11 (ref 5–15)
BUN: 13 mg/dL (ref 8–23)
CO2: 26 mmol/L (ref 22–32)
Calcium: 9.6 mg/dL (ref 8.9–10.3)
Chloride: 104 mmol/L (ref 98–111)
Creatinine, Ser: 0.89 mg/dL (ref 0.44–1.00)
GFR, Estimated: >60 mL/min (ref >60)
Glucose, Bld: 105 mg/dL — ABNORMAL HIGH (ref 70–99)
Potassium: 3.3 mmol/L — ABNORMAL LOW (ref 3.5–5.1)
Sodium: 141 mmol/L (ref 135–145)
Total Bilirubin: 0.6 mg/dL (ref 0.3–1.2)
Total Protein: 7.1 g/dL (ref 6.5–8.1)

## 2021-05-02 LAB — CBC
HCT: 41.7 % (ref 36.0–46.0)
Hemoglobin: 13.6 g/dL (ref 12.0–15.0)
MCH: 26.8 pg (ref 26.0–34.0)
MCHC: 32.6 g/dL (ref 30.0–36.0)
MCV: 82.1 fL (ref 80.0–100.0)
Platelets: 223 10*3/uL (ref 150–400)
RBC: 5.08 MIL/uL (ref 3.87–5.11)
RDW: 14.9 % (ref 11.5–15.5)
WBC: 8.6 10*3/uL (ref 4.0–10.5)
nRBC: 0 % (ref 0.0–0.2)

## 2021-05-02 LAB — LIPASE, BLOOD: Lipase: 13 U/L (ref 11–51)

## 2021-05-02 MED ORDER — ALUM & MAG HYDROXIDE-SIMETH 200-200-20 MG/5ML PO SUSP
30.0000 mL | Freq: Once | ORAL | Status: AC
Start: 2021-05-02 — End: 2021-05-02
  Administered 2021-05-02: 30 mL via ORAL
  Filled 2021-05-02: qty 30

## 2021-05-02 MED ORDER — ONDANSETRON 4 MG PO TBDP
4.0000 mg | ORAL_TABLET | Freq: Once | ORAL | Status: AC
  Administered 2021-05-02: 4 mg via ORAL
  Filled 2021-05-02: qty 1

## 2021-05-02 MED ORDER — SODIUM CHLORIDE 0.9 % IV BOLUS
1000.0000 mL | Freq: Once | INTRAVENOUS | Status: AC
  Administered 2021-05-02: 1000 mL via INTRAVENOUS

## 2021-05-02 MED ORDER — FAMOTIDINE 20 MG PO TABS
20.0000 mg | ORAL_TABLET | Freq: Two times a day (BID) | ORAL | 0 refills | Status: DC
Start: 2021-05-02 — End: 2022-08-06
  Filled 2021-05-02: qty 30, 15d supply, fill #0

## 2021-05-02 MED ORDER — FAMOTIDINE IN NACL 20-0.9 MG/50ML-% IV SOLN
20.0000 mg | Freq: Once | INTRAVENOUS | Status: AC
  Administered 2021-05-02: 20 mg via INTRAVENOUS
  Filled 2021-05-02: qty 50

## 2021-05-02 MED ORDER — LIDOCAINE VISCOUS HCL 2 % MT SOLN
15.0000 mL | Freq: Once | OROMUCOSAL | Status: AC
Start: 2021-05-02 — End: 2021-05-02
  Administered 2021-05-02: 15 mL via ORAL
  Filled 2021-05-02: qty 15

## 2021-05-02 MED ORDER — IOHEXOL 300 MG/ML  SOLN
85.0000 mL | Freq: Once | INTRAMUSCULAR | Status: AC | PRN
  Administered 2021-05-02: 85 mL via INTRAVENOUS

## 2021-05-02 NOTE — ED Notes (Signed)
Patient ambulatory to bathroom.

## 2021-05-02 NOTE — ED Provider Notes (Signed)
[place] EMERGENCY DEPT Provider Note   CSN: [id number] Arrival date & time: 05/02/21  1138     History  Chief Complaint  Patient presents with   Nausea    [name] is a 81 y.o. female.  HPI   Pt is an 81 y/o female with a h/o diabetes, AKI, diverticulitis, GERD, who presents to the ED today for eval of nausea and chills that have been present for last 9 days. She reports dry heaves, vomiting 1-2x, diarrhea, constipation, frequency. She had some abdominal discomfort but is mainly complaining of lower back pain (she is not sure if this is related to her chronic pain but thinks that it is maybe a bit more intense than normal)   Denies dysuria. She reports a h/o acid reflux and has had some reflux sxs. She does not take anything for her acid reflux.  States she was seen by her PCP for this and was given an antiemetic. She was told this was a viral infection. She has had no improvement of symptoms.   Home Medications Prior to Admission medications   Medication Sig Start Date End Date Taking? Authorizing Provider  famotidine (PEPCID) 20 MG tablet Take 1 tablet (20 mg total) by mouth 2 (two) times daily. 05/02/21  Yes Trishelle Devora S, PA-C  acetaminophen (TYLENOL) 500 MG tablet Take 1,000 mg by mouth every 8 (eight) hours as needed for mild pain or moderate pain.     [provider]  Blood Glucose Monitoring Suppl (ACCU-CHEK AVIVA PLUS) w/Device KIT Use Accu Chek Aviva as instructed to check blood sugar 4 times daily. 05/03/19   [name], MD  BYSTOLIC 5 MG tablet Take 5 mg by mouth at bedtime. Nebivolol 07/09/19   [provider]  cetirizine (ZYRTEC) 10 MG tablet Take 1 tablet (10 mg total) by mouth daily as needed for allergies. Patient taking differently: Take 10 mg by mouth 2 (two) times daily. 07/03/18   Hennie Duos, MD  Continuous Blood Gluc Sensor (DEXCOM G6 SENSOR) MISC Use to monitor blood sugar, change after 10 days 04/19/21    [name], MD  DROPLET PEN NEEDLES 32G X 4 MM MISC INJECT  INSULIN FOUR TIMES DAILY 08/16/20   [name], MD  Evolocumab with Infusor ([place]) 420 MG/3.5ML SOCT Inject 420 mg into the skin every 30 (thirty) days. 10/05/20   [name], MD  FARXIGA 5 MG TABS tablet TAKE 1 TABLET EVERY DAY 12/28/20   [name], MD  fluticasone [facility]) 50 MCG/ACT nasal spray Place 1 spray into both nostrils daily as needed for allergies. Patient taking differently: Place 2 sprays into both nostrils daily. 07/03/18   Hennie Duos, MD  gabapentin (NEURONTIN) 600 MG tablet TAKE 1 TABLET(600 MG) BY MOUTH THREE TIMES DAILY Patient taking differently: Take 600 mg by mouth 3 (three) times daily. 10/19/18   Kathrynn Ducking, MD  glucose blood (ACCU-CHEK AVIVA PLUS) test strip TEST BLOOD SUGAR FOUR TIMES DAILY AS DIRECTED 10/05/20   [name], MD  insulin aspart (NOVOLOG FLEXPEN) 100 UNIT/ML FlexPen INJECT 6 UNITS BEFORE BREAKFAST, 9 UNITS AT LUNCH, 8-12 UNITS AT DINNER, 3-4 UNITS AS NEEDED FOR LARGE CARBS OR ICE CREAM 01/08/21   [name], MD  insulin degludec (TRESIBA FLEXTOUCH) 100 UNIT/ML FlexTouch Pen Inject 24 Units into the skin daily. 06/16/20   [name], MD  irbesartan-hydrochlorothiazide (AVALIDE) 150-12.5 MG tablet Take 1 tablet by mouth daily. 04/21/21   [name], MD  ketoconazole (  NIZORAL) 2 % cream Apply 1 application topically daily as needed (Itching).    [provider]  levothyroxine (SYNTHROID) 50 MCG tablet TAKE 1 TABLET(50 MCG) BY MOUTH DAILY 08/25/20   [name], MD  Multiple Minerals-Vitamins (CAL-MAG-ZINC-D) TABS Take 1 tablet by mouth daily. 330 mg /130 mg/ 5 mg /5 mg    [provider]  SYMBICORT 80-4.5 MCG/ACT inhaler Inhale 2 puffs into the lungs 2 (two) times daily as needed (shortness of breath). Patient taking differently: Inhale 2 puffs into the lungs 2 (two) times daily. 07/03/18   Hennie Duos, MD      Allergies    Cymbalta [duloxetine  hcl], Amlodipine besylate, Baclofen, Betadine [povidone iodine], Erythromycin base, Oxycodone hcl, Percocet [oxycodone-acetaminophen], Povidone-iodine, Soap, Statins, Adhesive [tape], and Lipitor [atorvastatin]    Review of Systems   Review of Systems  Constitutional:  Positive for chills. Negative for fever.  HENT:  Negative for ear pain and sore throat.   Eyes:  Negative for visual disturbance.  Respiratory:  Negative for cough and shortness of breath.   Cardiovascular:  Negative for chest pain.  Gastrointestinal:  Positive for abdominal pain, constipation, diarrhea, nausea and vomiting.  Genitourinary:  Negative for dysuria and hematuria.  Musculoskeletal:  Negative for back pain.  Skin:  Negative for rash.  Neurological:  Negative for seizures and syncope.  All other systems reviewed and are negative.  Physical Exam Updated Vital Signs BP (!) 150/90 (BP Location: Right Arm)    Pulse 76    Temp 98.3 F (36.8 C) (Oral)    Resp 16    Ht _0  (1.6 m)    Wt 83 kg    LMP  (LMP Unknown)    SpO2 98%    BMI 32.41 kg/m  Physical Exam Vitals and nursing note reviewed.  Constitutional:      General: She is not in acute distress.    Appearance: She is well-developed.  HENT:     Head: Normocephalic and atraumatic.     Mouth/Throat:     Mouth: Mucous membranes are dry.  Eyes:     Conjunctiva/sclera: Conjunctivae normal.  Cardiovascular:     Rate and Rhythm: Normal rate and regular rhythm.     Heart sounds: Normal heart sounds. No murmur heard. Pulmonary:     Effort: Pulmonary effort is normal. No respiratory distress.     Breath sounds: Normal breath sounds. No wheezing, rhonchi or rales.  Abdominal:     General: Bowel sounds are normal.     Palpations: Abdomen is soft.     Tenderness: There is abdominal tenderness in the right upper quadrant, epigastric area and left upper quadrant. There is guarding. There is no rebound.  Musculoskeletal:        General: No swelling.     Cervical  back: Neck supple.  Skin:    General: Skin is warm and dry.     Capillary Refill: Capillary refill takes less than 2 seconds.  Neurological:     Mental Status: She is alert.  Psychiatric:        Mood and Affect: Mood normal.    ED Results / Procedures / Treatments   Labs (all labs ordered are listed, but only abnormal results are displayed) Labs Reviewed  COMPREHENSIVE METABOLIC PANEL - Abnormal; Notable for the following components:      Result Value   Potassium 3.3 (*)    Glucose, Bld 105 (*)    AST 12 (*)    All  other components within normal limits  URINALYSIS, ROUTINE W REFLEX MICROSCOPIC - Abnormal; Notable for the following components:   Ketones, ur 15 (*)    All other components within normal limits  LIPASE, BLOOD  CBC    EKG None  Radiology CT ABDOMEN PELVIS W CONTRAST  Result Date: 05/02/2021 CLINICAL DATA:  Epigastric abdominal pain and nausea for 1 week. Vomiting until 2 days ago. EXAM: CT ABDOMEN AND PELVIS WITH CONTRAST TECHNIQUE: Multidetector CT imaging of the abdomen and pelvis was performed using the standard protocol following bolus administration of intravenous contrast. CONTRAST:  63m OMNIPAQUE IOHEXOL 300 MG/ML  SOLN COMPARISON:  11/08/2020 FINDINGS: Lower chest: Mild bibasilar linear atelectasis/scarring without significant change. Small hiatal hernia. Hepatobiliary: No focal liver abnormality is seen. A 4 mm gallstone is again demonstrated in the dependent portion of the gallbladder. No gallbladder wall thickening or pericholecystic fluid. Pancreas: Unremarkable. No pancreatic ductal dilatation or surrounding inflammatory changes. Spleen: Normal in size without focal abnormality. Adrenals/Urinary Tract: Normal appearing adrenal glands. Bilateral renal cysts. 3 mm left renal calculus. No bladder or ureteral calculi and no hydronephrosis. Stomach/Bowel: Small hiatal hernia. Large number of sigmoid colon diverticula without evidence of diverticulitis. Normal  amount of stool. The appendix is not visualized. Vascular/Lymphatic: Atheromatous arterial calcifications without aneurysm. No enlarged lymph nodes. Reproductive: Status post hysterectomy. No adnexal masses. Other: Small left inguinal hernia containing fat. 2 small fat containing hernias in the umbilical region. Musculoskeletal: Laminectomy defects throughout the lumbar spine with interbody fusion at multiple levels and pedicle screw and rod fusion throughout the lumbar and lower thoracic spine. IMPRESSION: 1. No acute abnormality. 2. Small hiatal hernia. 3. Stable cholelithiasis without evidence of cholecystitis. 4. 3 mm nonobstructing left renal calculus. 5. Extensive sigmoid colon diverticulosis without evidence of diverticulitis. Electronically Signed   By: SClaudie ReveringM.D.   On: 05/02/2021 13:57    Procedures Procedures    9:55 PM Cardiac monitoring reveals NSR, HR 77 (Rate & rhythm), as reviewed and interpreted by me. Cardiac monitoring was ordered due to epigastric abd pain and to monitor patient for dysrhythmia.   Medications Ordered in ED Medications  sodium chloride 0.9 % bolus 1,000 mL (0 mLs Intravenous Stopped 05/02/21 1421)  ondansetron (ZOFRAN-ODT) disintegrating tablet 4 mg (4 mg Oral Given 05/02/21 1241)  alum & mag hydroxide-simeth (MAALOX/MYLANTA) 200-200-20 MG/5ML suspension 30 mL (30 mLs Oral Given 05/02/21 1241)    And  lidocaine (XYLOCAINE) 2 % viscous mouth solution 15 mL (15 mLs Oral Given 05/02/21 1241)  famotidine (PEPCID) IVPB 20 mg premix (0 mg Intravenous Stopped 05/02/21 1348)  iohexol (OMNIPAQUE) 300 MG/ML solution 85 mL (85 mLs Intravenous Contrast Given 05/02/21 1322)    ED Course/ Medical Decision Making/ A&P                           Medical Decision Making  81year old female presenting the emergency department today for evaluation of nausea for the last 9 days.  Had about 2 episodes of vomiting which is since improved.  Denies other complaints at this  time.  Reviewed/interpreted labs CBC is without leukocytosis or anemia CMP shows mild hypokalemia but is otherwise unremarkable Lipase is negative UA shows ketonuria but otherwise does not show any signs of infection  EKG does not show any signs of ischemia  CT abdomen/pelvis viewed/interpreted- 1. No acute abnormality. 2. Small hiatal hernia. 3. Stable cholelithiasis without evidence of cholecystitis. 4. 3 mm nonobstructing left renal calculus. 5.  Extensive sigmoid colon diverticulosis without evidence of diverticulitis.  Work-up reassuring.  Patient without any emergent process in the abdomen/pelvis at this time.  I do suspect that she may have acid reflux or gastritis which is causing her symptoms.  She was given medication for this fall in the ED including Pepcid and a GI cocktail.  She is also given Zofran.  On reassessment she states she feels significantly improved.  We will start her on Pepcid for home.  I did advise that she follow-up with her PCP and or her gastroenterologist with regard to her symptoms and advised that she return to the ED for new or worsening symptoms.  She voices understanding the plan and reasons to return.  All questions answered.  Patient stable for discharge.  Final Clinical Impression(s) / ED Diagnoses Final diagnoses:  Nausea    Rx / DC Orders ED Discharge Orders          Ordered    famotidine (PEPCID) 20 MG tablet  2 times daily        05/02/21 1602              Gwendolen Hewlett S, PA-C 05/02/21 2155    Lorelle Gibbs, DO 05/03/21 1540

## 2021-05-02 NOTE — ED Triage Notes (Addendum)
Nauseated x 1 week and was told if not better to come to ER, last vomited  2 days ago , more gag she states ,  states has alb pain all the time, states may be constipated , tried an enema but it did not work

## 2021-05-02 NOTE — Discharge Instructions (Addendum)
Take pepcid as directed   Continue taking the zofran your doctor has prescribed for your nausea   Please follow up with your primary doctor or gastroenterologist within the next 5-7 days.  If you do not have a primary care provider, information for a healthcare clinic has been provided for you to make arrangements for follow up care. Please return to the ER sooner if you have any new or worsening symptoms, or if you have any of the following symptoms:  Abdominal pain that does not go away.  You have a fever.  You keep throwing up (vomiting).  The pain is felt only in portions of the abdomen. Pain in the right side could possibly be appendicitis. In an adult, pain in the left lower portion of the abdomen could be colitis or diverticulitis.  You pass bloody or black tarry stools.  There is bright red blood in the stool.  The constipation stays for more than 4 days.  There is belly (abdominal) or rectal pain.  You do not seem to be getting better.  You have any questions or concerns.

## 2021-05-02 NOTE — ED Notes (Signed)
EMT-P provided AVS using Teachback Method. Patient verbalizes understanding of Discharge Instructions. Opportunity for Questioning and Answers were provided by EMT-P. Patient Discharged from ED. Safe transport called for patient.

## 2021-05-04 MED ORDER — DEXCOM G6 SENSOR MISC: 2 refills | Status: DC

## 2021-05-04 MED ORDER — BYSTOLIC 5 MG PO TABS: 5.0000 mg | ORAL_TABLET | Freq: Every day | ORAL | 2 refills | Status: DC

## 2021-05-04 MED ORDER — LEVOTHYROXINE SODIUM 50 MCG PO TABS: ORAL_TABLET | ORAL | 2 refills | Status: DC

## 2021-05-04 MED ORDER — IRBESARTAN-HYDROCHLOROTHIAZIDE 150-12.5 MG PO TABS: 1.0000 | ORAL_TABLET | Freq: Every day | ORAL | 2 refills | Status: AC

## 2021-05-04 MED ORDER — DAPAGLIFLOZIN PROPANEDIOL 5 MG PO TABS: 5.0000 mg | ORAL_TABLET | Freq: Every day | ORAL | 2 refills | Status: DC

## 2021-05-04 MED ORDER — DEXCOM G6 TRANSMITTER MISC: 2 refills | Status: DC

## 2021-05-04 MED ORDER — BD PEN NEEDLE NANO U/F 32G X 4 MM MISC: 2 refills | Status: DC

## 2021-05-04 MED ORDER — NOVOLOG FLEXPEN 100 UNIT/ML ~~LOC~~ SOPN: PEN_INJECTOR | SUBCUTANEOUS | 2 refills | Status: DC

## 2021-05-04 NOTE — Addendum Note (Signed)
Addended by: Cinda Quest on: 05/04/2021 12:13 PM   Modules accepted: Orders

## 2021-05-10 ENCOUNTER — Other Ambulatory Visit: Payer: Self-pay

## 2021-05-10 DIAGNOSIS — E1165 Type 2 diabetes mellitus with hyperglycemia: Secondary | ICD-10-CM

## 2021-05-10 MED ORDER — INSULIN LISPRO (1 UNIT DIAL) 100 UNIT/ML (KWIKPEN): PEN_INJECTOR | SUBCUTANEOUS | 2 refills | Status: DC

## 2021-05-17 ENCOUNTER — Telehealth: Payer: Self-pay | Admitting: Endocrinology

## 2021-05-17 ENCOUNTER — Other Ambulatory Visit: Payer: Self-pay

## 2021-05-17 DIAGNOSIS — E1165 Type 2 diabetes mellitus with hyperglycemia: Secondary | ICD-10-CM

## 2021-05-17 DIAGNOSIS — Z794 Long term (current) use of insulin: Secondary | ICD-10-CM

## 2021-05-17 MED ORDER — BD PEN NEEDLE NANO U/F 32G X 4 MM MISC: 2 refills | Status: AC

## 2021-05-17 NOTE — Telephone Encounter (Signed)
Paperwork completed and faxed back. Received paperwork that Novolog no longer covered under insurance but humalog is.

## 2021-05-17 NOTE — Telephone Encounter (Signed)
Pharmacy called concerning prescription for Humulin Kwikpen and [employer] what the current therapy should be. Please provide instruction as soon as possible. [phone] reference number [id number]

## 2021-05-29 ENCOUNTER — Encounter: Payer: Self-pay | Admitting: Endocrinology

## 2021-05-29 DIAGNOSIS — E1165 Type 2 diabetes mellitus with hyperglycemia: Secondary | ICD-10-CM

## 2021-05-31 DIAGNOSIS — R42 Dizziness and giddiness: Secondary | ICD-10-CM | POA: Diagnosis not present

## 2021-05-31 DIAGNOSIS — R9431 Abnormal electrocardiogram [ECG] [EKG]: Secondary | ICD-10-CM | POA: Diagnosis not present

## 2021-05-31 MED ORDER — DEXCOM G6 SENSOR MISC: 2 refills | Status: DC

## 2021-06-01 IMAGING — DX DG KNEE 1-2V PORT*R*
2 series · 3 of 3 positions shown · non-contrast
Comparison: Knee radiograph dated 05/24/2019.

CLINICAL DATA: Seven 8-year-old female status post right knee
replacement.

EXAM:
PORTABLE RIGHT KNEE - 1-2 VIEW

[Series 1: knee ap · 0.14mm/px · 2 of 2 slices shown]
[im 1/2]
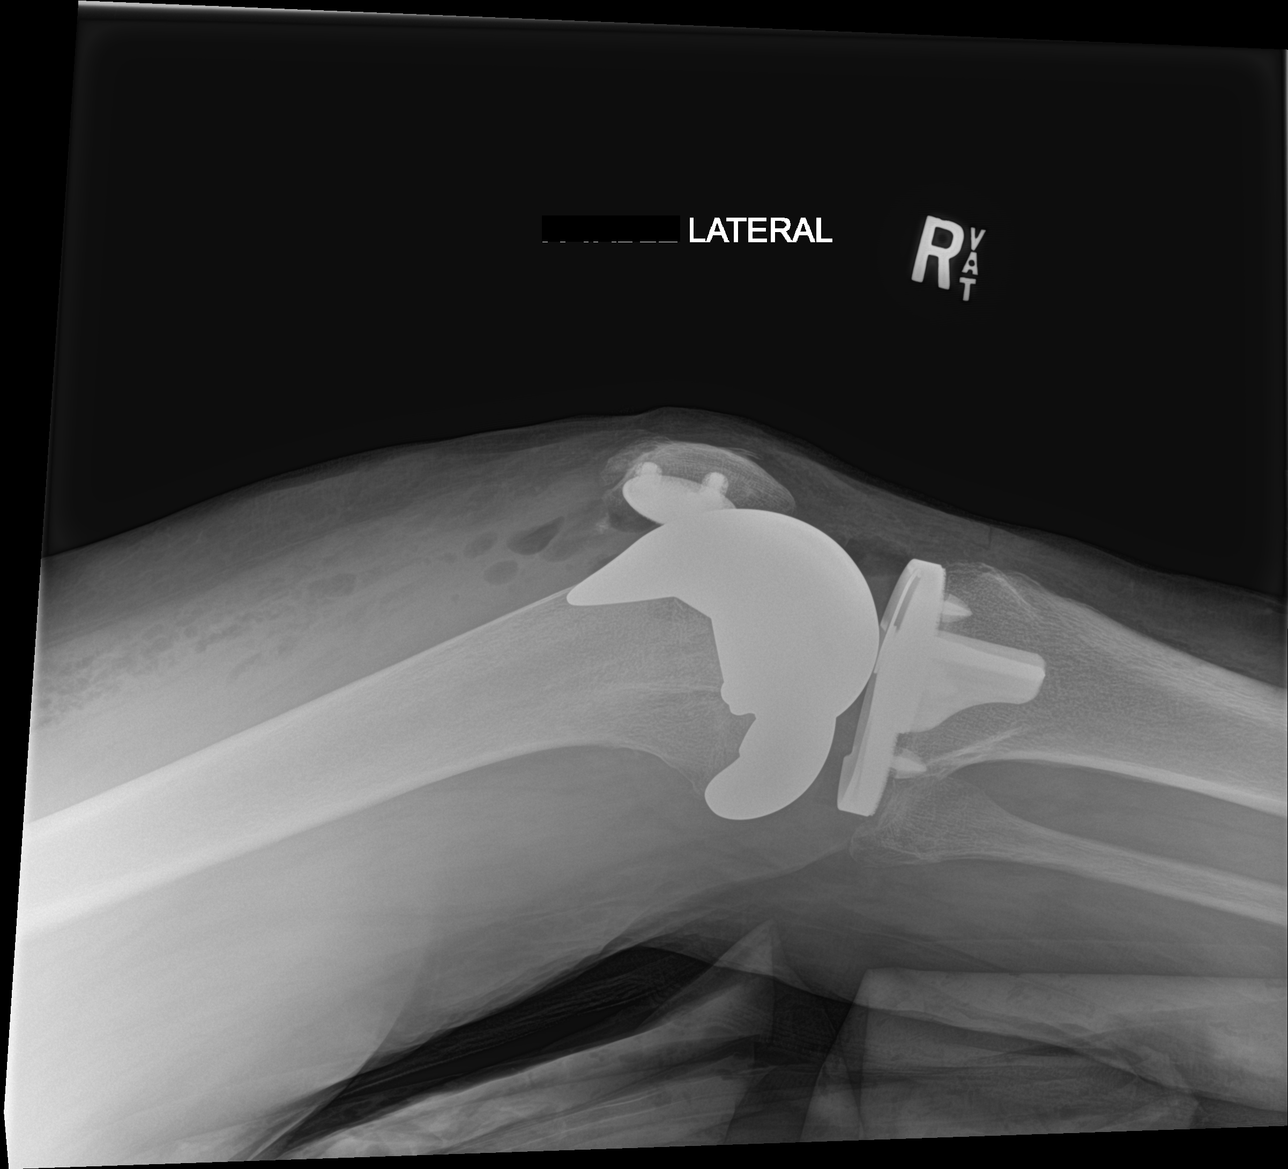
[im 2/2]
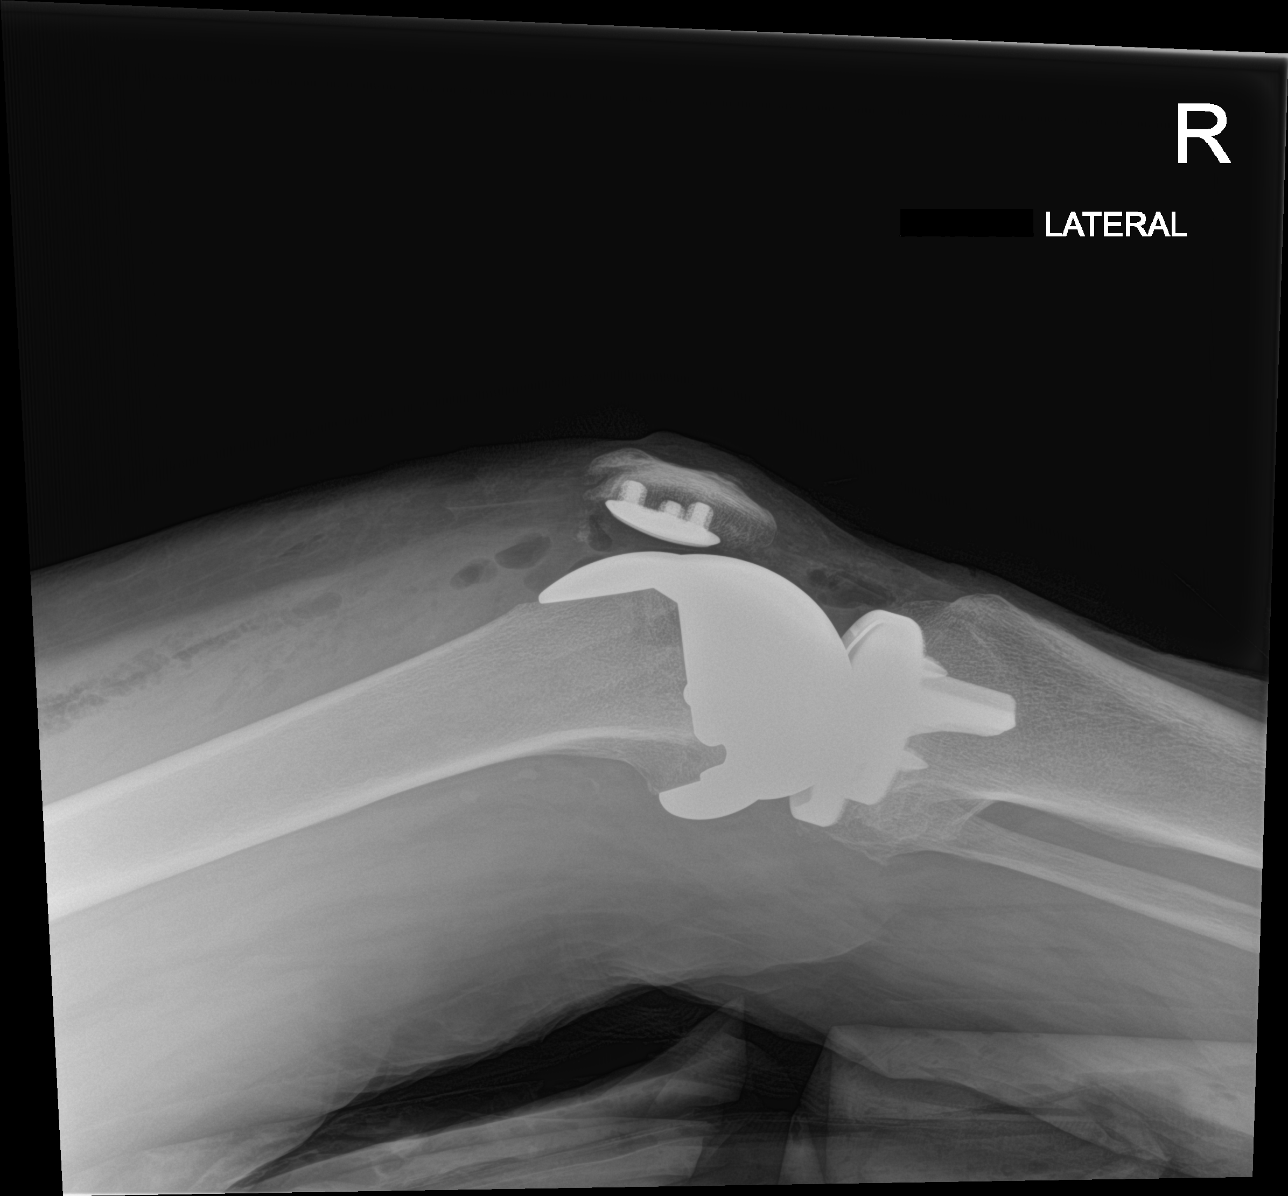

[knee lat]
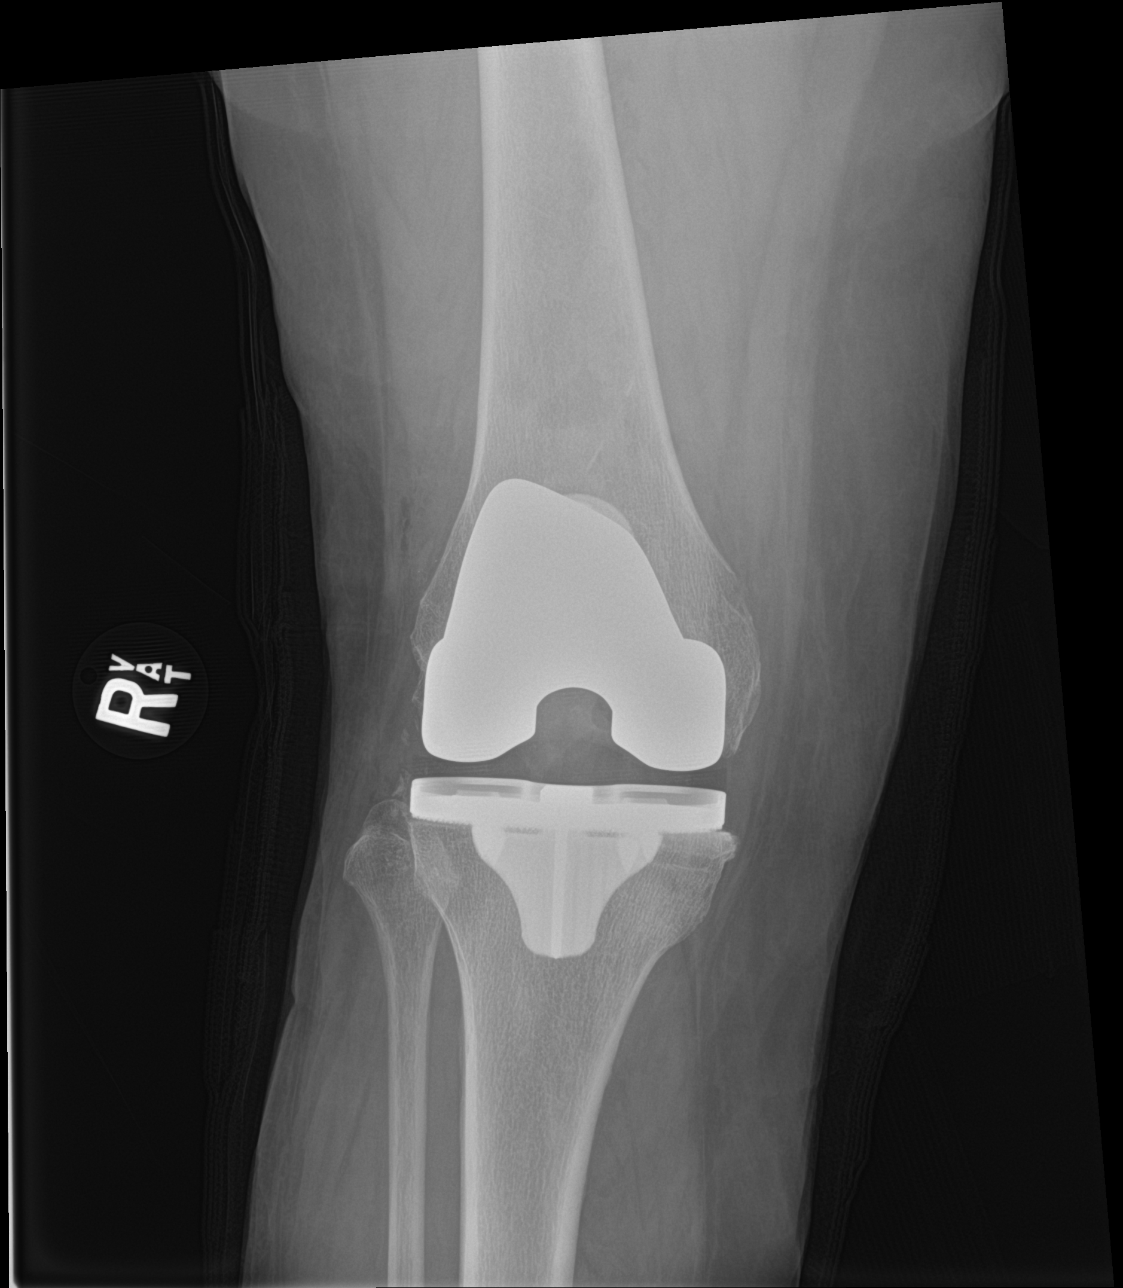

[3 of 3 positions shown; findings below may reference images not displayed]

FINDINGS: There is a total right knee arthroplasty. The arthroplasty
components appear intact and in anatomic alignment. No evidence of
loosening. There is no acute fracture or dislocation. The bones are
osteopenic. Postsurgical changes with air and fluid within the joint
space.
IMPRESSION: Total right knee arthroplasty. No evidence of hardware complication.

## 2021-06-12 ENCOUNTER — Ambulatory Visit: Payer: Medicare (Managed Care) | Admitting: Cardiology

## 2021-06-18 ENCOUNTER — Other Ambulatory Visit: Payer: Self-pay

## 2021-06-18 ENCOUNTER — Ambulatory Visit: Payer: Medicare (Managed Care) | Admitting: Cardiology

## 2021-06-18 ENCOUNTER — Encounter: Payer: Self-pay | Admitting: Cardiology

## 2021-06-18 VITALS — BP 128/67 | HR 73 | Temp 97.8°F | Resp 16 | Ht 63.0 in | Wt 178.0 lb

## 2021-06-18 DIAGNOSIS — Z789 Other specified health status: Secondary | ICD-10-CM

## 2021-06-18 DIAGNOSIS — E1165 Type 2 diabetes mellitus with hyperglycemia: Secondary | ICD-10-CM

## 2021-06-18 DIAGNOSIS — E7800 Pure hypercholesterolemia, unspecified: Secondary | ICD-10-CM

## 2021-06-18 DIAGNOSIS — R9431 Abnormal electrocardiogram [ECG] [EKG]: Secondary | ICD-10-CM

## 2021-06-18 DIAGNOSIS — Z87891 Personal history of nicotine dependence: Secondary | ICD-10-CM

## 2021-06-18 DIAGNOSIS — R0602 Shortness of breath: Secondary | ICD-10-CM

## 2021-06-18 DIAGNOSIS — Z794 Long term (current) use of insulin: Secondary | ICD-10-CM

## 2021-06-18 DIAGNOSIS — I10 Essential (primary) hypertension: Secondary | ICD-10-CM

## 2021-06-18 NOTE — Progress Notes (Signed)
Date:  06/18/2021   ID:  [name], DOB 1941/02/03, MRN [id number]  PCP:  Wenda Low, MD  Cardiologist:  Rex Kras, DO, [facility] (established care 06/18/2021) Former Cardiology Providers: Dr. Lucilla Edin   REASON FOR CONSULT: Abnormal EKG.  REQUESTING PHYSICIAN:  Wenda Low, MD [place] [employer] [address],  [place] [address]  Chief Complaint  Patient presents with   Abnormal ECG   New Patient (Initial Visit)    Referred by Wenda Low, MD   Shortness of Breath    HPI  [name] is a 81 y.o. African-American female who presents to the office with a chief complaint of " shortness of breath and abnormal EKG." Patient's past medical history and cardiovascular risk factors include: Obstructive sleep apnea on CPAP, pure hypercholesterolemia, hypertension, obesity due to excess calories,  hypothyroidism, history of influenza A infection, sickle cell trait, insulin-dependent diabetes type 2, history of statin intolerance, former smoker.   She is referred to the office at the request of Wenda Low, MD for evaluation of Abnormal EKG.  Patient recently followed up with her PCP for symptoms of dizziness.  Her antihypertensive medications were down titrated due to soft blood pressures, mild orthostasis and since then patient states that his symptoms have improved.  During the same office encounter an EKG was obtained and per report patient was noted to have atrial rhythm and nonspecific T changes and therefore referred to cardiology for further evaluation and management.  Patient denies any anginal discomfort at rest or with effort related activities.  No change in overall physical endurance as she continues to walk a quarter of a mile every day after lunch with her dog.  However, she has noticed shortness of breath with effort related activities has been ongoing for the last couple years.  Symptoms are usually resolved after resting, she feels more tired and  fatigued after over exertional activities.  Denies orthopnea, paroxysmal nocturnal dyspnea or lower extremity swelling.  FUNCTIONAL STATUS: Lives in group home and walks 0.25 mile everyday w/ her dog.    ALLERGIES: Allergies  Allergen Reactions   Cymbalta [Duloxetine Hcl] Diarrhea and Other (See Comments)    Dizziness, headache, irritability   Amlodipine Besylate Swelling   Baclofen     jerks    Betadine [Povidone Iodine] Swelling and Other (See Comments)    SWELLING REACTION DESCRIPTION/SEVERITY UNSPECIFIED  Reaction to betadine on eye lid and it swelled up   Erythromycin Base Other (See Comments)    unknown   Oxycodone Hcl    Percocet [Oxycodone-Acetaminophen] Other (See Comments)    Confusion change personality   Povidone-Iodine Swelling   Soap Other (See Comments)    unknown   Statins Other (See Comments)    Muscle cramps   Adhesive [Tape] Rash   Lipitor [Atorvastatin] Rash    MEDICATION LIST PRIOR TO VISIT: Current Meds  Medication Sig   acetaminophen (TYLENOL) 500 MG tablet Take 1,000 mg by mouth every 8 (eight) hours as needed for mild pain or moderate pain.    Blood Glucose Monitoring Suppl (ACCU-CHEK AVIVA PLUS) w/Device KIT Use Accu Chek Aviva as instructed to check blood sugar 4 times daily.   BYSTOLIC 5 MG tablet Take 1 tablet (5 mg total) by mouth at bedtime. Nebivolol   cetirizine (ZYRTEC) 10 MG tablet Take 1 tablet (10 mg total) by mouth daily as needed for allergies. (Patient taking differently: Take 10 mg by mouth 2 (two) times daily.)   Continuous Blood Gluc  Sensor (DEXCOM G6 SENSOR) MISC Use to monitor blood sugar, change after 10 days   Continuous Blood Gluc Transmit (DEXCOM G6 TRANSMITTER) MISC Change every 3 months   dapagliflozin propanediol (FARXIGA) 5 MG TABS tablet Take 1 tablet (5 mg total) by mouth daily.   Evolocumab with Infusor ([place]) 420 MG/3.5ML SOCT Inject 420 mg into the skin every 30 (thirty) days.   famotidine  (PEPCID) 20 MG tablet Take 1 tablet (20 mg total) by mouth 2 (two) times daily.   fluticasone (FLONASE) 50 MCG/ACT nasal spray Place 1 spray into both nostrils daily as needed for allergies. (Patient taking differently: Place 2 sprays into both nostrils daily.)   gabapentin (NEURONTIN) 600 MG tablet TAKE 1 TABLET(600 MG) BY MOUTH THREE TIMES DAILY (Patient taking differently: Take 600 mg by mouth 3 (three) times daily.)   glucose blood (ACCU-CHEK AVIVA PLUS) test strip TEST BLOOD SUGAR FOUR TIMES DAILY AS DIRECTED   insulin aspart (NOVOLOG FLEXPEN) 100 UNIT/ML FlexPen INJECT 6 UNITS BEFORE BREAKFAST, 9 UNITS AT LUNCH, 8-12 UNITS AT DINNER, 3-4 UNITS AS NEEDED FOR LARGE CARBS OR ICE CREAM   insulin degludec (TRESIBA FLEXTOUCH) 100 UNIT/ML FlexTouch Pen Inject 24 Units into the skin daily.   insulin lispro (HUMALOG) 100 UNIT/ML KwikPen INJECT 6 UNITS BEFORE BREAKFAST, 9 UNITS AT LUNCH, 8-12 UNITS AT DINNER, 3-4 UNITS AS NEEDED FOR LARGE CARBS OR ICE CREAM   Insulin Pen Needle (BD PEN NEEDLE NANO U/F) 32G X 4 MM MISC Inject insulin 4 times a day   irbesartan-hydrochlorothiazide (AVALIDE) 150-12.5 MG tablet Take 1 tablet by mouth daily.   ketoconazole (NIZORAL) 2 % cream Apply 1 application topically daily as needed (Itching).   levothyroxine (SYNTHROID) 50 MCG tablet TAKE 1 TABLET(50 MCG) BY MOUTH DAILY   Multiple Minerals-Vitamins (CAL-MAG-ZINC-D) TABS Take 1 tablet by mouth daily. 330 mg /130 mg/ 5 mg /5 mg   SYMBICORT 80-4.5 MCG/ACT inhaler Inhale 2 puffs into the lungs 2 (two) times daily as needed (shortness of breath). (Patient taking differently: Inhale 2 puffs into the lungs 2 (two) times daily.)     PAST MEDICAL HISTORY: Past Medical History:  Diagnosis Date   Acute blood loss anemia    Acute encephalopathy 07/15/2016   AKI (acute kidney injury) ([place])    Anemia    has sickle cell trait   Anxiety    Arthritis    Asthma    has used inhaler in past for asthmatic bronchitis, last time-  early 2012   Bilateral primary osteoarthritis of knee 76/10/3417   Complication of anesthesia    wakes up shaking   Diabetes mellitus    Diabetes mellitus with neuropathy ([place]) 10/29/2012   Diverticulitis    Dyslipidemia    Dyspnea    with exertion   Encephalopathy 06/2016   due to medications after surgery   Fibromyalgia    GERD (gastroesophageal reflux disease)    occas. use of  Prilosec   Heart murmur    sees Dr. Montez Morita, last seen- early 2012   Herniated nucleus pulposus, L2-3 06/25/2016   History of back surgery    Hypertension    02/2010- stress test /w PCP   Hypothyroidism    Loose bowel movements 12/2016   Lumbar radiculopathy 06/27/2016   Lumbar stenosis with neurogenic claudication 03/17/2017   Memory disorder 02/16/2016   Myoclonic jerking    Neuromuscular disorder (HCC)    lumbar radiculopathy, lumbago   Nocturnal leg cramps 09/27/2014   Osteoporosis 03/10/2014  Pneumonia    Sickle cell trait ([place])    Sleep apnea    borderline sleep apnea, states she no longer uses, early 2012- stopped using    Spinal stenosis of lumbar region with radiculopathy 02/28/2014   Spondylolisthesis of lumbar region 03/19/2011   Type II or unspecified type diabetes mellitus without mention of complication, uncontrolled 07/08/2013    PAST SURGICAL HISTORY: Past Surgical History:  Procedure Laterality Date   ABDOMINAL HYSTERECTOMY     adb.cyst     ovarian cyst   BACK SURGERY     2012, 2015 (3 total)   BACK SURGERY  2018   02/24/2017   COLONOSCOPY     EYE SURGERY     macular degeneration treatment - injections   FEMUR IM NAIL Left 06/13/2018   Procedure: INTRAMEDULLARY (IM) NAIL FEMORAL;  Surgeon: [name], MD;  Location: [facility];  Service: Orthopedics;  Laterality: Left;   OVARIAN CYST SURGERY     POSTERIOR LUMBAR FUSION 4 LEVEL N/A 03/17/2017   Procedure: Decompression of Lumbar One-Two with Thoracic Ten to Lumbar Two Fusion;  Surgeon: Kristeen Miss, MD;  Location: [facility];  Service: Neurosurgery;  Laterality: N/A;  Decompression of L1-2 with T10 to L2 Fusion   TOTAL KNEE ARTHROPLASTY Right 08/13/2019   Procedure: RIGHT TOTAL KNEE ARTHROPLASTY;  Surgeon: [name], MD;  Location: [facility];  Service: Orthopedics;  Laterality: Right;   TOTAL KNEE REVISION Right 07/05/2020   Procedure: TOTAL KNEE REVISION;  Surgeon: Gaynelle Arabian, MD;  Location: [facility];  Service: Orthopedics;  Laterality: Right;  164mn    FAMILY HISTORY: The patient family history includes Breast cancer in her paternal aunt; Cancer - Prostate in her father; Multiple myeloma in her paternal aunt; Ovarian cancer in her mother.  SOCIAL HISTORY:  The patient  reports that she quit smoking about 18 years ago. Her smoking use included cigarettes. She has a 3.00 pack-year smoking history. She has never used smokeless tobacco. She reports current alcohol use. She reports that she does not use drugs.  REVIEW OF SYSTEMS: Review of Systems  Constitutional: Positive for malaise/fatigue.  Cardiovascular:  Positive for dyspnea on exertion. Negative for chest pain, leg swelling, near-syncope, orthopnea, palpitations, paroxysmal nocturnal dyspnea and syncope.  Respiratory:  Positive for shortness of breath.    PHYSICAL EXAM: Vitals with BMI 06/18/2021 05/02/2021 05/02/2021  Height _0  - -  Weight 178 lbs - -  BMI 392.11- -  Systolic [id number] Diastolic 67 90 1481 Pulse 73 76 71    CONSTITUTIONAL: Well-developed and well-nourished. No acute distress.  Walks with a walker SKIN: Skin is warm and dry. No rash noted. No cyanosis. No pallor. No jaundice HEAD: Normocephalic and atraumatic.  EYES: No scleral icterus MOUTH/THROAT: Moist oral membranes.  NECK: No JVD present. No thyromegaly noted. No carotid bruits  LYMPHATIC: No visible cervical adenopathy.  CHEST Normal respiratory effort. No intercostal retractions  LUNGS: Clear to auscultation bilaterally.  No stridor. No wheezes. No  rales.  CARDIOVASCULAR: Regular rate and rhythm, positive S1-S2, 3 out of 6 systolic ejection murmur heard at the second right intercostal space, no rubs or gallops appreciated. ABDOMINAL: Obese, soft, nontender, nondistended, positive bowel sounds all 4 quadrants. No apparent ascites.  EXTREMITIES: No peripheral edema, warm to touch, 2+ bilateral DP and PT pulses HEMATOLOGIC: No significant bruising NEUROLOGIC: Oriented to person, place, and time. Nonfocal. Normal muscle tone.  PSYCHIATRIC: Normal mood and affect. Normal behavior. Cooperative  CARDIAC DATABASE: EKG: 06/18/2021: NSR, 71 bpm, without underlying ischemia or injury pattern.   Echocardiogram: 10/14/2014: LVEF 44-31%, Grade I diastolic dysfunction, trivial MR, mild TR.    Stress Testing: No results found for this or any previous visit from the past 1095 days.   Heart Catheterization: None  LABORATORY DATA: CBC Latest Ref Rng & Units 05/02/2021 07/07/2020 07/06/2020  WBC 4.0 - 10.5 K/uL 8.6 8.3 9.6  Hemoglobin 12.0 - 15.0 g/dL 13.6 10.6(L) 11.6(L)  Hematocrit 36.0 - 46.0 % 41.7 32.7(L) 35.9(L)  Platelets 150 - 400 K/uL 223 154 179    CMP Latest Ref Rng & Units 05/02/2021 04/10/2021 01/11/2021  Glucose 70 - 99 mg/dL 105(H) 90 107(H)  BUN 8 - 23 mg/dL _0 Creatinine 0.44 - 1.00 mg/dL 0.89 1.02 1.23(H)  Sodium 135 - 145 mmol/L 141 143 142  Potassium 3.5 - 5.1 mmol/L 3.3(L) 3.8 3.8  Chloride 98 - 111 mmol/L 104 105 103  CO2 22 - 32 mmol/L _1 Calcium 8.9 - 10.3 mg/dL 9.6 9.8 9.6  Total Protein 6.5 - 8.1 g/dL 7.1 6.5 7.0  Total Bilirubin 0.3 - 1.2 mg/dL 0.6 0.7 0.6  Alkaline Phos 38 - 126 U/L 44 54 67  AST 15 - 41 U/L 12(L) 14 13  ALT 0 - 44 U/L _2 Lipid Panel  Lab Results  Component Value Date   CHOL 240 (H) 04/10/2021   HDL 48.90 04/10/2021   LDLCALC 162 (H) 04/10/2021   LDLDIRECT 166.0 02/09/2019   TRIG 150.0 (H) 04/10/2021   CHOLHDL 5 04/10/2021     No components found for: NTPROBNP No  results for input(s): PROBNP in the last 8760 hours. Recent Labs    01/11/21 1128 04/10/21 1055  TSH 2.36 4.10    BMP Recent Labs    07/06/20 0310 07/07/20 0334 09/21/20 1052 01/11/21 1128 04/10/21 1055 05/02/21 1210  NA 140 139   < > 142 143 141  K 4.1 3.9   < > 3.8 3.8 3.3*  CL 105 105   < > 103 105 104  CO2 26 24   < > _3 GLUCOSE 211* 222*   < > 107* 90 105*  BUN 23 31*   < > _4 CREATININE 1.32* 1.06*   < > 1.23* 1.02 0.89  CALCIUM 8.8* 9.1   < > 9.6 9.8 9.6  GFRNONAA 41* 53*  --   --   --  >60   < > = values in this interval not displayed.    HEMOGLOBIN A1C Lab Results  Component Value Date   HGBA1C 6.7 (H) 04/10/2021   MPG 151.33 06/22/2020   External Labs: Collected: 03/08/2021 provided by referring physician. Hemoglobin 14 g/dL, hematocrit 42.2%. BUN 21, creatinine 1.12 mg/dL. Sodium 144, potassium 3.9, chloride 104, bicarb 32. AST 11, ALT 8, alkaline phosphatase 68  IMPRESSION:    ICD-10-CM   1. Shortness of breath  R06.02 PCV ECHOCARDIOGRAM COMPLETE    PCV MYOCARDIAL PERFUSION WITH LEXISCAN    2. Nonspecific abnormal electrocardiogram (ECG) (EKG)  R94.31 EKG 12-Lead    PCV ECHOCARDIOGRAM COMPLETE    PCV MYOCARDIAL PERFUSION WITH LEXISCAN    3. Type 2 diabetes mellitus with hyperglycemia, with long-term current use of insulin (HCC)  E11.65    Z79.4     4. Benign hypertension  I10     5. Pure hypercholesterolemia  E78.00     6. Statin intolerance  Z78.9     7. Former smoker  Z87.891        RECOMMENDATIONS: JAEDYNN BOHLKEN is a 81 y.o. African-American female whose past medical history and cardiac risk factors include: Obstructive sleep apnea on CPAP, pure hypercholesterolemia, hypertension, obesity due to excess calories,  hypothyroidism, history of influenza A infection, sickle cell trait, insulin-dependent diabetes type 2, history of statin intolerance, former smoker.   Shortness of breath / Nonspecific abnormal  electrocardiogram (ECG) (EKG): No active anginal discomfort. She does have shortness of breath with effort related activities which is more pronounced chronic, has multiple cardiovascular risk factors including insulin-dependent diabetes, advanced age, increased tired/fatigue after physical exertion (new compared to before) and therefore the shared decision was to proceed with ischemic work-up. Echo will be ordered to evaluate for structural heart disease and left ventricular systolic function. Patient would benefit from stress testing; however, she has not able to exercise due to age and walking with a walker.  And therefore pharmacological stress test will be considered to evaluate for reversible ischemia. Further recommendations to follow  Type 2 diabetes mellitus with hyperglycemia, with long-term current use of insulin ([place]) Educated on importance of glycemic control. Medications reconciled. Outside labs independently reviewed and noted above for further reference.  Benign hypertension Office blood pressures are very well controlled. Medications reconciled. Reemphasized importance of a low-salt diet.  Pure hypercholesterolemia  / Statin intolerance: April 2018 LDL used to be 180 mg/dL. Currently on Repatha. History of statin intolerance. Currently managed by endocrinology.  Outside labs independently reviewed, prior office notes from referring physician reviewed independently, ordered and interpreted EKG during today's office visit, additional diagnostic work-up ordered as discussed above.   FINAL MEDICATION LIST END OF ENCOUNTER: No orders of the defined types were placed in this encounter.   There are no discontinued medications.   Current Outpatient Medications:    acetaminophen (TYLENOL) 500 MG tablet, Take 1,000 mg by mouth every 8 (eight) hours as needed for mild pain or moderate pain. , Disp: , Rfl:    Blood Glucose Monitoring Suppl (ACCU-CHEK AVIVA PLUS) w/Device KIT,  Use Accu Chek Aviva as instructed to check blood sugar 4 times daily., Disp: 1 kit, Rfl: 0   BYSTOLIC 5 MG tablet, Take 1 tablet (5 mg total) by mouth at bedtime. Nebivolol, Disp: 90 tablet, Rfl: 2   cetirizine (ZYRTEC) 10 MG tablet, Take 1 tablet (10 mg total) by mouth daily as needed for allergies. (Patient taking differently: Take 10 mg by mouth 2 (two) times daily.), Disp: 30 tablet, Rfl: 0   Continuous Blood Gluc Sensor (DEXCOM G6 SENSOR) MISC, Use to monitor blood sugar, change after 10 days, Disp: 6 each, Rfl: 2   Continuous Blood Gluc Transmit (DEXCOM G6 TRANSMITTER) MISC, Change every 3 months, Disp: 1 each, Rfl: 2   dapagliflozin propanediol (FARXIGA) 5 MG TABS tablet, Take 1 tablet (5 mg total) by mouth daily., Disp: 90 tablet, Rfl: 2   Evolocumab with Infusor ([place]) 420 MG/3.5ML SOCT, Inject 420 mg into the skin every 30 (thirty) days., Disp: 10.5 mL, Rfl: 2   famotidine (PEPCID) 20 MG tablet, Take 1 tablet (20 mg total) by mouth 2 (two) times daily., Disp: 30 tablet, Rfl: 0   fluticasone (FLONASE) 50 MCG/ACT nasal spray, Place 1 spray into both nostrils daily as needed for allergies. (Patient taking differently: Place 2 sprays into both nostrils daily.), Disp: 0.003 g, Rfl: 0   gabapentin (NEURONTIN) 600 MG tablet, TAKE 1 TABLET(600 MG) BY  MOUTH THREE TIMES DAILY (Patient taking differently: Take 600 mg by mouth 3 (three) times daily.), Disp: 270 tablet, Rfl: 1   glucose blood (ACCU-CHEK AVIVA PLUS) test strip, TEST BLOOD SUGAR FOUR TIMES DAILY AS DIRECTED, Disp: 400 strip, Rfl: 3   insulin aspart (NOVOLOG FLEXPEN) 100 UNIT/ML FlexPen, INJECT 6 UNITS BEFORE BREAKFAST, 9 UNITS AT LUNCH, 8-12 UNITS AT DINNER, 3-4 UNITS AS NEEDED FOR LARGE CARBS OR ICE CREAM, Disp: 30 mL, Rfl: 2   insulin degludec (TRESIBA FLEXTOUCH) 100 UNIT/ML FlexTouch Pen, Inject 24 Units into the skin daily., Disp: 30 mL, Rfl: 3   insulin lispro (HUMALOG) 100 UNIT/ML KwikPen, INJECT 6 UNITS BEFORE  BREAKFAST, 9 UNITS AT LUNCH, 8-12 UNITS AT DINNER, 3-4 UNITS AS NEEDED FOR LARGE CARBS OR ICE CREAM, Disp: 30 mL, Rfl: 2   Insulin Pen Needle (BD PEN NEEDLE NANO U/F) 32G X 4 MM MISC, Inject insulin 4 times a day, Disp: 400 each, Rfl: 2   irbesartan-hydrochlorothiazide (AVALIDE) 150-12.5 MG tablet, Take 1 tablet by mouth daily., Disp: 90 tablet, Rfl: 2   ketoconazole (NIZORAL) 2 % cream, Apply 1 application topically daily as needed (Itching)., Disp: , Rfl:    levothyroxine (SYNTHROID) 50 MCG tablet, TAKE 1 TABLET(50 MCG) BY MOUTH DAILY, Disp: 90 tablet, Rfl: 2   Multiple Minerals-Vitamins (CAL-MAG-ZINC-D) TABS, Take 1 tablet by mouth daily. 330 mg /130 mg/ 5 mg /5 mg, Disp: , Rfl:    SYMBICORT 80-4.5 MCG/ACT inhaler, Inhale 2 puffs into the lungs 2 (two) times daily as needed (shortness of breath). (Patient taking differently: Inhale 2 puffs into the lungs 2 (two) times daily.), Disp: 1 Inhaler, Rfl: 0   HYDROcodone-acetaminophen (NORCO/VICODIN) 5-325 MG tablet, Take 1 tablet by mouth every 6 (six) hours as needed., Disp: , Rfl:    pantoprazole (PROTONIX) 40 MG tablet, Take 1 tablet by mouth as needed., Disp: , Rfl:    Semaglutide,0.25 or 0.5MG/DOS, (OZEMPIC, 0.25 OR 0.5 MG/DOSE,) 2 MG/1.5ML SOPN, Inject 0.25 mg into the skin See admin instructions., Disp: , Rfl:   Orders Placed This Encounter  Procedures   PCV MYOCARDIAL PERFUSION WITH LEXISCAN   EKG 12-Lead   PCV ECHOCARDIOGRAM COMPLETE    There are no Patient Instructions on file for this visit.   --Continue cardiac medications as reconciled in final medication list. --Return in about 6 weeks (around 07/30/2021) for Follow up, Dyspnea, Review test results. Or sooner if needed. --Continue follow-up with your primary care physician regarding the management of your other chronic comorbid conditions.  Patient's questions and concerns were addressed to her satisfaction. She voices understanding of the instructions provided during this  encounter.   This note was created using a voice recognition software as a result there may be grammatical errors inadvertently enclosed that do not reflect the nature of this encounter. Every attempt is made to correct such errors.  Rex Kras, [place], [facility]  Pager: [phone] Office: [phone]

## 2021-06-21 ENCOUNTER — Other Ambulatory Visit: Payer: Medicare (Managed Care)

## 2021-06-26 ENCOUNTER — Encounter: Payer: Self-pay | Admitting: Endocrinology

## 2021-06-26 DIAGNOSIS — E1165 Type 2 diabetes mellitus with hyperglycemia: Secondary | ICD-10-CM

## 2021-06-26 DIAGNOSIS — Z794 Long term (current) use of insulin: Secondary | ICD-10-CM

## 2021-06-26 MED ORDER — OZEMPIC (0.25 OR 0.5 MG/DOSE) 2 MG/1.5ML ~~LOC~~ SOPN: 0.2500 mg | PEN_INJECTOR | SUBCUTANEOUS | 0 refills | Status: DC

## 2021-06-28 ENCOUNTER — Other Ambulatory Visit: Payer: Self-pay

## 2021-06-28 ENCOUNTER — Encounter: Payer: Self-pay | Admitting: Endocrinology

## 2021-06-28 DIAGNOSIS — E1165 Type 2 diabetes mellitus with hyperglycemia: Secondary | ICD-10-CM

## 2021-06-28 DIAGNOSIS — Z794 Long term (current) use of insulin: Secondary | ICD-10-CM

## 2021-06-28 MED ORDER — OZEMPIC (0.25 OR 0.5 MG/DOSE) 2 MG/1.5ML ~~LOC~~ SOPN: PEN_INJECTOR | SUBCUTANEOUS | 0 refills | Status: DC

## 2021-06-29 MED ORDER — TRESIBA FLEXTOUCH 100 UNIT/ML ~~LOC~~ SOPN: 24.0000 [IU] | PEN_INJECTOR | Freq: Every day | SUBCUTANEOUS | 0 refills | Status: DC

## 2021-06-29 NOTE — Telephone Encounter (Signed)
Handled on another encounter

## 2021-07-09 ENCOUNTER — Other Ambulatory Visit: Payer: Self-pay

## 2021-07-09 ENCOUNTER — Ambulatory Visit: Payer: Medicare (Managed Care)

## 2021-07-09 DIAGNOSIS — R9431 Abnormal electrocardiogram [ECG] [EKG]: Secondary | ICD-10-CM

## 2021-07-09 DIAGNOSIS — R0602 Shortness of breath: Secondary | ICD-10-CM

## 2021-07-12 ENCOUNTER — Other Ambulatory Visit (INDEPENDENT_AMBULATORY_CARE_PROVIDER_SITE_OTHER): Payer: Medicare (Managed Care)

## 2021-07-12 ENCOUNTER — Other Ambulatory Visit: Payer: Self-pay

## 2021-07-12 DIAGNOSIS — Z794 Long term (current) use of insulin: Secondary | ICD-10-CM | POA: Diagnosis not present

## 2021-07-12 DIAGNOSIS — E063 Autoimmune thyroiditis: Secondary | ICD-10-CM | POA: Diagnosis not present

## 2021-07-12 DIAGNOSIS — E1165 Type 2 diabetes mellitus with hyperglycemia: Secondary | ICD-10-CM | POA: Diagnosis not present

## 2021-07-12 DIAGNOSIS — E7800 Pure hypercholesterolemia, unspecified: Secondary | ICD-10-CM | POA: Diagnosis not present

## 2021-07-12 LAB — LIPID PANEL
Cholesterol: 140 mg/dL (ref 0–200)
HDL: 48.1 mg/dL (ref >39.00)
LDL Cholesterol: 74 mg/dL (ref 0–99)
NonHDL: 91.87
Total CHOL/HDL Ratio: 3
Triglycerides: 90 mg/dL (ref 0.0–149.0)
VLDL: 18 mg/dL (ref 0.0–40.0)

## 2021-07-12 LAB — TSH: TSH: 2.58 u[IU]/mL (ref 0.35–5.50)

## 2021-07-12 LAB — COMPREHENSIVE METABOLIC PANEL
ALT: 9 U/L (ref 0–35)
AST: 15 U/L (ref 0–37)
Albumin: 4.4 g/dL (ref 3.5–5.2)
Alkaline Phosphatase: 65 U/L (ref 39–117)
BUN: 12 mg/dL (ref 6–23)
CO2: 29 mEq/L (ref 19–32)
Calcium: 9.3 mg/dL (ref 8.4–10.5)
Chloride: 105 mEq/L (ref 96–112)
Creatinine, Ser: 0.99 mg/dL (ref 0.40–1.20)
GFR: 53.45 mL/min — ABNORMAL LOW (ref >60.00)
Glucose, Bld: 109 mg/dL — ABNORMAL HIGH (ref 70–99)
Potassium: 4 mEq/L (ref 3.5–5.1)
Sodium: 143 mEq/L (ref 135–145)
Total Bilirubin: 0.5 mg/dL (ref 0.2–1.2)
Total Protein: 6.6 g/dL (ref 6.0–8.3)

## 2021-07-12 LAB — HEMOGLOBIN A1C: Hgb A1c MFr Bld: 6.6 % — ABNORMAL HIGH (ref 4.6–6.5)

## 2021-07-17 ENCOUNTER — Ambulatory Visit (INDEPENDENT_AMBULATORY_CARE_PROVIDER_SITE_OTHER): Payer: Medicare (Managed Care) | Admitting: Endocrinology

## 2021-07-17 ENCOUNTER — Encounter: Payer: Self-pay | Admitting: Endocrinology

## 2021-07-17 ENCOUNTER — Other Ambulatory Visit: Payer: Self-pay

## 2021-07-17 VITALS — BP 122/80 | HR 62 | Ht 63.0 in | Wt 177.6 lb

## 2021-07-17 DIAGNOSIS — E7800 Pure hypercholesterolemia, unspecified: Secondary | ICD-10-CM

## 2021-07-17 DIAGNOSIS — E063 Autoimmune thyroiditis: Secondary | ICD-10-CM

## 2021-07-17 DIAGNOSIS — E1165 Type 2 diabetes mellitus with hyperglycemia: Secondary | ICD-10-CM | POA: Diagnosis not present

## 2021-07-17 DIAGNOSIS — I10 Essential (primary) hypertension: Secondary | ICD-10-CM | POA: Diagnosis not present

## 2021-07-17 DIAGNOSIS — Z794 Long term (current) use of insulin: Secondary | ICD-10-CM

## 2021-07-17 NOTE — Patient Instructions (Addendum)
Ozempic 0.'5mg'$  weekly ? ?Check blood sugars on waking up 3-4 days a week ? ?Also check blood sugars about 2 hours after meals and do this after different meals by rotation ? ?Recommended blood sugar levels on waking up are 90-130 and about 2 hours after meal is 130-160 ? ?Please bring your blood sugar monitor to each visit, thank you ? ?2 Units Novolog LESS at all times, may cut down further ? ?

## 2021-07-17 NOTE — Progress Notes (Signed)
? ? ?Patient ID: [name], female   DOB: 11-27-1940, 81 y.o.   MRN: [id number] ? ? ?  ? ?Reason for Appointment: Endocrinology follow-up ? ?History of Present Illness   ? ?PROBLEM 1: Type 2 diabetes mellitus, date of diagnosis: 1998.  ? ?Prior history: She had previously been treated with Byetta, Glumetza, Amaryl, Victoza and  Onglyza ?However because of inadequate control and intolerance to drugs she was finally given pre-meal insulin along with Amaryl, Glumetza eventually stopped because of side effects and also Lantus added ? ?Recent history: ? ?The insulin regimen is: Novolog 6 at breakfast and 10 lunch, 9/10 acs Tresiba 20 units daily ? ?Oral agents: Farxiga 5 mg daily, Ozempic 0.25 mg weekly ? ?Her A1c is 6.6 ? ?A1c previous range 5.9--7.4 ? ?Current blood sugar patterns and problems with management: ? ?Has not been using the [place] and she is unclear why she was not able to get the new version of the [place] from her supplier  ?She is not very regularly checking her blood sugars and doing some sporadic readings before breakfast or lunch are rarely at bedtime she was started on Ozempic at her request and although she was supposed to titrate up to 0.5 mg she is still on 0.25  ?Overall she is trying to do better with her diet and watching portions and avoiding sweets  ?She is losing weight since her last visit although not clear if this is from [place] or not  ?Fasting glucose 109 ?She says she is trying to do some walking with her dog regularly ?No hypoglycemic symptoms ?She has not changed her insulin doses since last visit except Tyler Aas was reduced by 2 units ? ? ?Side effects from diabetes medications: Victoza caused nausea.  Metformin ? ?Blood sugars from Accu-Chek review: ? ?PRE-MEAL Fasting Lunch Dinner Bedtime Overall  ?Glucose range: 98-109 100-109  68, 104   ?Mean/median:     109  ? ? ? ?Previous blood sugar data as follows, Elenor Legato was reading falsely low ? ?CGM use % of time   ?2-week  average/GV 116  ?Time in range 91  ?% Time Above 180 6  ?% Time above 250   ?% Time Below 70 3  ? ?  ?PRE-MEAL Fasting Lunch Dinner Bedtime Overall  ?Glucose range:       ?Averages: 85 120 107 117   ? ?POST-MEAL PC Breakfast PC Lunch PC Dinner  ?Glucose range:     ?Averages: 96 124 147  ? ? ? ? ?Certified Diabetes Educator visit: Most recent: 7/13.  ?Dietician visit: Most recent: 3/13.  ? ?Wt Readings from Last 3 Encounters:  ?07/17/21 177 lb 9.6 oz (80.6 kg)  ?06/18/21 178 lb (80.7 kg)  ?05/02/21 182 lb 15.7 oz (83 kg)  ? ?Complications: Neuropathy ? ?LABS: ? ?Lab Results  ?Component Value Date  ? HGBA1C 6.6 (H) 07/12/2021  ? HGBA1C 6.7 (H) 04/10/2021  ? HGBA1C 7.0 (H) 01/11/2021  ? ?Lab Results  ?Component Value Date  ? MICROALBUR 2.4 (H) 04/10/2021  ? [place] 74 07/12/2021  ? CREATININE 0.99 07/12/2021  ? ?Lab Results  ?Component Value Date  ? FRUCTOSAMINE 283 06/09/2018  ? FRUCTOSAMINE 296 (H) 05/16/2017  ? FRUCTOSAMINE 322 (H) 10/10/2016  ? ? ?Multiple other issues are addressed in review of systems ? ?Lab on 07/12/2021  ?Component Date Value Ref Range Status  ? TSH 07/12/2021 2.58  0.35 - 5.50 uIU/mL Final  ? Cholesterol 07/12/2021 140  0 - 200 mg/dL Final  ?  ATP III Classification       Desirable:  < 200 mg/dL               Borderline High:  200 - 239 mg/dL          High:  > = 240 mg/dL  ? Triglycerides 07/12/2021 90.0  0.0 - 149.0 mg/dL Final  ? Normal:  <150 mg/dLBorderline High:  150 - 199 mg/dL  ? HDL 07/12/2021 48.10  >39.00 mg/dL Final  ? VLDL 07/12/2021 18.0  0.0 - 40.0 mg/dL Final  ? LDL Cholesterol 07/12/2021 74  0 - 99 mg/dL Final  ? Total CHOL/HDL Ratio 07/12/2021 3   Final  ?                Men          Women1/2 Average Risk     3.4          3.3Average Risk          5.0          4.42X Average Risk          9.6          7.13X Average Risk          15.0          11.0                      ? NonHDL 07/12/2021 91.87   Final  ? NOTE:  Non-HDL goal should be 30 mg/dL higher than patient's LDL goal (i.e.  LDL goal of < 70 mg/dL, would have non-HDL goal of < 100 mg/dL)  ? Sodium 07/12/2021 143  135 - 145 mEq/L Final  ? Potassium 07/12/2021 4.0  3.5 - 5.1 mEq/L Final  ? Chloride 07/12/2021 105  96 - 112 mEq/L Final  ? CO2 07/12/2021 29  19 - 32 mEq/L Final  ? Glucose, Bld 07/12/2021 109 (H)  70 - 99 mg/dL Final  ? BUN 07/12/2021 12  6 - 23 mg/dL Final  ? Creatinine, Ser 07/12/2021 0.99  0.40 - 1.20 mg/dL Final  ? Total Bilirubin 07/12/2021 0.5  0.2 - 1.2 mg/dL Final  ? Alkaline Phosphatase 07/12/2021 65  39 - 117 U/L Final  ? AST 07/12/2021 15  0 - 37 U/L Final  ? ALT 07/12/2021 9  0 - 35 U/L Final  ? Total Protein 07/12/2021 6.6  6.0 - 8.3 g/dL Final  ? Albumin 07/12/2021 4.4  3.5 - 5.2 g/dL Final  ? GFR 07/12/2021 53.45 (L)  >60.00 mL/min Final  ? Calculated using the CKD-EPI Creatinine Equation (2021)  ? Calcium 07/12/2021 9.3  8.4 - 10.5 mg/dL Final  ? Hgb A1c MFr Bld 07/12/2021 6.6 (H)  4.6 - 6.5 % Final  ? Glycemic Control Guidelines for People with Diabetes:Non Diabetic:  <6%Goal of Therapy: <7%Additional Action Suggested:  >8%   ? ? ? ? ?Allergies as of 07/17/2021   ? ?   Reactions  ? Cymbalta [duloxetine Hcl] Diarrhea, Other (See Comments)  ? Dizziness, headache, irritability  ? Amlodipine Besylate Swelling  ? Baclofen   ? jerks   ? Betadine [povidone Iodine] Swelling, Other (See Comments)  ? SWELLING REACTION DESCRIPTION/SEVERITY UNSPECIFIED  ?Reaction to betadine on eye lid and it swelled up  ? Erythromycin Base Other (See Comments)  ? unknown  ? Oxycodone Hcl   ? Percocet [oxycodone-acetaminophen] Other (See Comments)  ? Confusion ?change personality  ? Povidone-iodine Swelling  ?  Soap Other (See Comments)  ? unknown  ? Statins Other (See Comments)  ? Muscle cramps  ? Adhesive [tape] Rash  ? Lipitor [atorvastatin] Rash  ? ?  ? ?  ?Medication List  ?  ? ?  ? Accurate as of July 17, 2021  7:46 PM. If you have any questions, ask your nurse or doctor.  ?  ?  ? ?  ? ?Accu-Chek Aviva Plus test strip ?Generic  drug: glucose blood ?TEST BLOOD SUGAR FOUR TIMES DAILY AS DIRECTED ?  ?Accu-Chek Aviva Plus w/Device Kit ?Use Accu Chek Aviva as instructed to check blood sugar 4 times daily. ?  ?acetaminophen 500 MG tablet ?Commonly known as: TYLENOL ?Take 1,000 mg by mouth every 8 (eight) hours as needed for mild pain or moderate pain. ?  ?BD Pen Needle Nano U/F 32G X 4 MM Misc ?Generic drug: Insulin Pen Needle ?Inject insulin 4 times a day ?  ?Bystolic 5 MG tablet ?Generic drug: nebivolol ?Take 1 tablet (5 mg total) by mouth at bedtime. Nebivolol ?  ?Cal-Mag-Zinc-D Tabs ?Take 1 tablet by mouth daily. 330 mg /130 mg/ 5 mg /5 mg ?  ?cetirizine 10 MG tablet ?Commonly known as: ZYRTEC ?Take 1 tablet (10 mg total) by mouth daily as needed for allergies. ?What changed: when to take this ?  ?dapagliflozin propanediol 5 MG Tabs tablet ?Commonly known as: [place] ?Take 1 tablet (5 mg total) by mouth daily. ?  ?Dexcom G6 Sensor Misc ?Use to monitor blood sugar, change after 10 days ?  ?Dexcom G6 Transmitter Misc ?Change every 3 months ?  ?famotidine 20 MG tablet ?Commonly known as: PEPCID ?Take 1 tablet (20 mg total) by mouth 2 (two) times daily. ?  ?fluticasone 50 MCG/ACT nasal spray ?Commonly known as: FLONASE ?Place 1 spray into both nostrils daily as needed for allergies. ?What changed:  ?how much to take ?when to take this ?  ?gabapentin 600 MG tablet ?Commonly known as: NEURONTIN ?TAKE 1 TABLET(600 MG) BY MOUTH THREE TIMES DAILY ?What changed: See the new instructions. ?  ?HYDROcodone-acetaminophen 5-325 MG tablet ?Commonly known as: NORCO/VICODIN ?Take 1 tablet by mouth every 6 (six) hours as needed. ?  ?insulin lispro 100 UNIT/ML KwikPen ?Commonly known as: HUMALOG ?INJECT 6 UNITS BEFORE BREAKFAST, 9 UNITS AT LUNCH, 8-12 UNITS AT DINNER, 3-4 UNITS AS NEEDED FOR LARGE CARBS OR ICE CREAM ?  ?irbesartan-hydrochlorothiazide 150-12.5 MG tablet ?Commonly known as: AVALIDE ?Take 1 tablet by mouth daily. ?  ?ketoconazole 2 %  cream ?Commonly known as: NIZORAL ?Apply 1 application topically daily as needed (Itching). ?  ?levothyroxine 50 MCG tablet ?Commonly known as: SYNTHROID ?TAKE 1 TABLET(50 MCG) BY MOUTH DAILY ?  ?NovoLOG FlexPen 100 UNIT/ML

## 2021-07-20 NOTE — Progress Notes (Signed)
Called pt to inform her about her stress test. Pt understood.

## 2021-07-26 DIAGNOSIS — M961 Postlaminectomy syndrome, not elsewhere classified: Secondary | ICD-10-CM | POA: Diagnosis not present

## 2021-07-26 DIAGNOSIS — M5416 Radiculopathy, lumbar region: Secondary | ICD-10-CM | POA: Diagnosis not present

## 2021-07-31 ENCOUNTER — Ambulatory Visit: Payer: Medicare (Managed Care) | Admitting: Cardiology

## 2021-07-31 ENCOUNTER — Encounter: Payer: Self-pay | Admitting: Cardiology

## 2021-07-31 VITALS — BP 116/72 | HR 78 | Temp 97.7°F | Resp 16 | Ht 63.0 in | Wt 178.0 lb

## 2021-07-31 DIAGNOSIS — Z87891 Personal history of nicotine dependence: Secondary | ICD-10-CM

## 2021-07-31 DIAGNOSIS — I10 Essential (primary) hypertension: Secondary | ICD-10-CM

## 2021-07-31 DIAGNOSIS — Z789 Other specified health status: Secondary | ICD-10-CM

## 2021-07-31 DIAGNOSIS — E7800 Pure hypercholesterolemia, unspecified: Secondary | ICD-10-CM

## 2021-07-31 DIAGNOSIS — R0602 Shortness of breath: Secondary | ICD-10-CM

## 2021-07-31 DIAGNOSIS — E1165 Type 2 diabetes mellitus with hyperglycemia: Secondary | ICD-10-CM

## 2021-07-31 NOTE — Progress Notes (Signed)
? ? ?ID:  [name], DOB 08-14-1940, MRN [id number] ? ?PCP:  Julia Low, MD  ?Cardiologist:  Rex Kras, DO, [facility] (established care 06/18/2021) ?Former Cardiology Providers: Dr. Lucilla Edin  ? ?Date: 07/31/21 ?Last Office Visit: 06/18/2021 ? ? ?Chief Complaint  ?Patient presents with  ? Follow-up  ?  6 weeks ?Shortness of breath. ?Discuss test results. ?  ? ? ?HPI  ?[name] is a 81 y.o. African-American female whose past medical history and cardiovascular risk factors include: Obstructive sleep apnea on CPAP, pure hypercholesterolemia, hypertension, obesity due to excess calories,  hypothyroidism, history of influenza A infection, sickle cell trait, insulin-dependent diabetes type 2, history of statin intolerance, former smoker.  ? ?Patient was referred to the practice for evaluation of abnormal EKG and given her shortness of breath with effort related activities along with other cardiovascular risk factors such as insulin-dependent diabetes, advanced age, the shared decision was to proceed with echocardiogram and stress test.  Echocardiogram still pending and a nuclear stress test reported to be overall a [name] risk study.  Clinically patient states that she is less short of breath given the medication titrations as discussed during her last office visit.  She denies angina pectoris or heart failure symptoms. ? ?She also history of hypercholesterolemia and given her statin intolerance has been on Repatha.  Her last documented LDL was 180 mg/dL as of April 2018.  We repeated fasting lipid profile and as of March 2023 her LDL is 74 mg/dL. ? ?Since last office visit she has increased her physical activity and now walks half a mile per day with her dog. ? ?ALLERGIES: ?Allergies  ?Allergen Reactions  ? Cymbalta [Duloxetine Hcl] Diarrhea and Other (See Comments)  ?  Dizziness, headache, irritability  ? Amlodipine Besylate Swelling  ? Baclofen   ?  jerks   ? Betadine [Povidone Iodine] Swelling and Other  (See Comments)  ?  SWELLING REACTION DESCRIPTION/SEVERITY UNSPECIFIED  ?Reaction to betadine on eye lid and it swelled up  ? Erythromycin Base Other (See Comments)  ?  unknown  ? Oxycodone Hcl   ? Percocet [Oxycodone-Acetaminophen] Other (See Comments)  ?  Confusion ?change personality  ? Povidone-Iodine Swelling  ? Soap Other (See Comments)  ?  unknown  ? Statins Other (See Comments)  ?  Muscle cramps  ? Adhesive [Tape] Rash  ? Lipitor [Atorvastatin] Rash  ? ? ?MEDICATION LIST PRIOR TO VISIT: ?Current Meds  ?Medication Sig  ? acetaminophen (TYLENOL) 500 MG tablet Take 1,000 mg by mouth every 8 (eight) hours as needed for mild pain or moderate pain.   ? Blood Glucose Monitoring Suppl (ACCU-CHEK AVIVA PLUS) w/Device KIT Use Accu Chek Aviva as instructed to check blood sugar 4 times daily.  ? BYSTOLIC 5 MG tablet Take 1 tablet (5 mg total) by mouth at bedtime. Nebivolol  ? cetirizine (ZYRTEC) 10 MG tablet Take 1 tablet (10 mg total) by mouth daily as needed for allergies. (Patient taking differently: Take 10 mg by mouth 2 (two) times daily.)  ? Continuous Blood Gluc Sensor (DEXCOM G6 SENSOR) MISC Use to monitor blood sugar, change after 10 days  ? Continuous Blood Gluc Transmit (DEXCOM G6 TRANSMITTER) MISC Change every 3 months  ? dapagliflozin propanediol (FARXIGA) 5 MG TABS tablet Take 1 tablet (5 mg total) by mouth daily.  ? Evolocumab with Infusor ([place]) 420 MG/3.5ML SOCT Inject 420 mg into the skin every 30 (thirty) days.  ? famotidine (PEPCID) 20 MG tablet Take 1  tablet (20 mg total) by mouth 2 (two) times daily.  ? fluticasone (FLONASE) 50 MCG/ACT nasal spray Place 1 spray into both nostrils daily as needed for allergies. (Patient taking differently: Place 2 sprays into both nostrils daily.)  ? gabapentin (NEURONTIN) 600 MG tablet TAKE 1 TABLET(600 MG) BY MOUTH THREE TIMES DAILY (Patient taking differently: Take 600 mg by mouth 3 (three) times daily.)  ? glucose blood (ACCU-CHEK AVIVA PLUS)  test strip TEST BLOOD SUGAR FOUR TIMES DAILY AS DIRECTED  ? HYDROcodone-acetaminophen (NORCO/VICODIN) 5-325 MG tablet Take 1 tablet by mouth every 6 (six) hours as needed.  ? insulin aspart (NOVOLOG FLEXPEN) 100 UNIT/ML FlexPen INJECT 6 UNITS BEFORE BREAKFAST, 9 UNITS AT LUNCH, 8-12 UNITS AT DINNER, 3-4 UNITS AS NEEDED FOR LARGE CARBS OR ICE CREAM  ? insulin degludec (TRESIBA FLEXTOUCH) 100 UNIT/ML FlexTouch Pen Inject 24 Units into the skin daily.  ? Insulin Pen Needle (BD PEN NEEDLE NANO U/F) 32G X 4 MM MISC Inject insulin 4 times a day  ? irbesartan-hydrochlorothiazide (AVALIDE) 150-12.5 MG tablet Take 1 tablet by mouth daily.  ? ketoconazole (NIZORAL) 2 % cream Apply 1 application topically daily as needed (Itching).  ? levothyroxine (SYNTHROID) 50 MCG tablet TAKE 1 TABLET(50 MCG) BY MOUTH DAILY  ? Multiple Minerals-Vitamins (CAL-MAG-ZINC-D) TABS Take 1 tablet by mouth daily. 330 mg /130 mg/ 5 mg /5 mg  ? pantoprazole (PROTONIX) 40 MG tablet Take 1 tablet by mouth as needed.  ? Semaglutide,0.25 or 0.5MG/DOS, (OZEMPIC, 0.25 OR 0.5 MG/DOSE,) 2 MG/1.5ML SOPN Inject .21m once weekly  ? SYMBICORT 80-4.5 MCG/ACT inhaler Inhale 2 puffs into the lungs 2 (two) times daily as needed (shortness of breath). (Patient taking differently: Inhale 2 puffs into the lungs 2 (two) times daily.)  ?  ? ?PAST MEDICAL HISTORY: ?Past Medical History:  ?Diagnosis Date  ? Acute blood loss anemia   ? Acute encephalopathy 07/15/2016  ? AKI (acute kidney injury) ([facility]   ? Anemia   ? has sickle cell trait  ? Anxiety   ? Arthritis   ? Asthma   ? has used inhaler in past for asthmatic bronchitis, last time- early 2012  ? Bilateral primary osteoarthritis of knee 03/26/2016  ? Complication of anesthesia   ? wakes up shaking  ? Diabetes mellitus   ? Diabetes mellitus with neuropathy ([place] 10/29/2012  ? Diverticulitis   ? Dyslipidemia   ? Dyspnea   ? with exertion  ? Encephalopathy 06/2016  ? due to medications after surgery  ? Fibromyalgia   ? GERD  (gastroesophageal reflux disease)   ? occas. use of  Prilosec  ? Heart murmur   ? sees Dr. SMontez Morita last seen- early 2012  ? Herniated nucleus pulposus, L2-3 06/25/2016  ? History of back surgery   ? Hypertension   ? 02/2010- stress test /w PCP  ? Hypothyroidism   ? Loose bowel movements 12/2016  ? Lumbar radiculopathy 06/27/2016  ? Lumbar stenosis with neurogenic claudication 03/17/2017  ? Memory disorder 02/16/2016  ? Myoclonic jerking   ? Neuromuscular disorder ([facility]   ? lumbar radiculopathy, lumbago  ? Nocturnal leg cramps 09/27/2014  ? Osteoporosis 03/10/2014  ? Pneumonia   ? Sickle cell trait ([facility]   ? Sleep apnea   ? borderline sleep apnea, states she no longer uses, early 2012- stopped using   ? Spinal stenosis of lumbar region with radiculopathy 02/28/2014  ? Spondylolisthesis of lumbar region 03/19/2011  ? Type II or unspecified type diabetes mellitus without mention of  complication, uncontrolled 07/08/2013  ? ? ?PAST SURGICAL HISTORY: ?Past Surgical History:  ?Procedure Laterality Date  ? ABDOMINAL HYSTERECTOMY    ? adb.cyst    ? ovarian cyst  ? BACK SURGERY    ? 2012, 2015 (3 total)  ? BACK SURGERY  2018  ? 02/24/2017  ? COLONOSCOPY    ? EYE SURGERY    ? macular degeneration treatment - injections  ? FEMUR IM NAIL Left 06/13/2018  ? Procedure: INTRAMEDULLARY (IM) NAIL FEMORAL;  Surgeon: [name], MD;  Location: [facility];  Service: Orthopedics;  Laterality: Left;  ? OVARIAN CYST SURGERY    ? POSTERIOR LUMBAR FUSION 4 LEVEL N/A 03/17/2017  ? Procedure: Decompression of Lumbar One-Two with Thoracic Ten to Lumbar Two Fusion;  Surgeon: Kristeen Miss, MD;  Location: [facility];  Service: Neurosurgery;  Laterality: N/A;  Decompression of L1-2 with T10 to L2 Fusion  ? TOTAL KNEE ARTHROPLASTY Right 08/13/2019  ? Procedure: RIGHT TOTAL KNEE ARTHROPLASTY;  Surgeon: [name], MD;  Location: [facility];  Service: Orthopedics;  Laterality: Right;  ? TOTAL KNEE REVISION Right 07/05/2020  ? Procedure: TOTAL KNEE  REVISION;  Surgeon: Gaynelle Arabian, MD;  Location: [facility];  Service: Orthopedics;  Laterality: Right;  174mn  ? ? ?FAMILY HISTORY: ?The patient family history includes Breast cancer in her paternal

## 2021-08-14 ENCOUNTER — Other Ambulatory Visit: Payer: Self-pay | Admitting: Endocrinology

## 2021-08-14 ENCOUNTER — Encounter: Payer: Self-pay | Admitting: Endocrinology

## 2021-08-14 DIAGNOSIS — E1165 Type 2 diabetes mellitus with hyperglycemia: Secondary | ICD-10-CM

## 2021-08-14 MED ORDER — REPATHA PUSHTRONEX SYSTEM 420 MG/3.5ML ~~LOC~~ SOCT: 420.0000 mg | SUBCUTANEOUS | 2 refills | Status: DC

## 2021-08-23 ENCOUNTER — Other Ambulatory Visit: Payer: Self-pay

## 2021-08-23 DIAGNOSIS — E1165 Type 2 diabetes mellitus with hyperglycemia: Secondary | ICD-10-CM

## 2021-08-23 MED ORDER — TRESIBA FLEXTOUCH 100 UNIT/ML ~~LOC~~ SOPN: 24.0000 [IU] | PEN_INJECTOR | Freq: Every day | SUBCUTANEOUS | 3 refills | Status: DC

## 2021-08-28 ENCOUNTER — Other Ambulatory Visit (HOSPITAL_COMMUNITY): Payer: Self-pay

## 2021-08-28 ENCOUNTER — Telehealth: Payer: Self-pay | Admitting: Pharmacy Technician

## 2021-08-28 DIAGNOSIS — E1165 Type 2 diabetes mellitus with hyperglycemia: Secondary | ICD-10-CM

## 2021-08-28 MED ORDER — REPATHA PUSHTRONEX SYSTEM 420 MG/3.5ML ~~LOC~~ SOCT: 420.0000 mg | SUBCUTANEOUS | 2 refills | Status: DC

## 2021-08-28 NOTE — Telephone Encounter (Signed)
Patient Advocate Encounter ?  ?Received notification from [place] that prior authorization for [place] Pushtronex is required by his/her insurance [employer]. ?  ?PA submitted on 08/28/21 ?Key  [id number] ?Status is pending ?   ?[facility] will continue to follow: ? ?Patient Advocate ?Fax:  [phone] ? ?

## 2021-08-28 NOTE — Addendum Note (Signed)
Addended by: Cinda Quest on: 08/28/2021 04:44 PM ? ? Modules accepted: Orders ? ?

## 2021-08-28 NOTE — Telephone Encounter (Signed)
Patient Advocate Encounter ? ?Prior Authorization for [place] Pushtronex has been approved.   ? ?PA# [id number] ?Effective dates: 07/29/21 through 08/28/22 ? ?Per Test Claim Patients co-pay is $60.  ? ?Spoke with [facility], so the pt will need to get it from their local pharmacy.  ? ?Patient Advocate ?Fax:  [phone] ? ?

## 2021-09-03 MED ORDER — OZEMPIC (0.25 OR 0.5 MG/DOSE) 2 MG/1.5ML ~~LOC~~ SOPN: PEN_INJECTOR | SUBCUTANEOUS | 0 refills | Status: DC

## 2021-09-06 DIAGNOSIS — E1142 Type 2 diabetes mellitus with diabetic polyneuropathy: Secondary | ICD-10-CM | POA: Diagnosis not present

## 2021-09-06 DIAGNOSIS — K8020 Calculus of gallbladder without cholecystitis without obstruction: Secondary | ICD-10-CM | POA: Diagnosis not present

## 2021-09-06 DIAGNOSIS — G720 Drug-induced myopathy: Secondary | ICD-10-CM | POA: Diagnosis not present

## 2021-09-06 DIAGNOSIS — K219 Gastro-esophageal reflux disease without esophagitis: Secondary | ICD-10-CM | POA: Diagnosis not present

## 2021-09-06 DIAGNOSIS — I10 Essential (primary) hypertension: Secondary | ICD-10-CM | POA: Diagnosis not present

## 2021-09-06 DIAGNOSIS — E7800 Pure hypercholesterolemia, unspecified: Secondary | ICD-10-CM | POA: Diagnosis not present

## 2021-09-06 DIAGNOSIS — G4733 Obstructive sleep apnea (adult) (pediatric): Secondary | ICD-10-CM | POA: Diagnosis not present

## 2021-09-06 DIAGNOSIS — E039 Hypothyroidism, unspecified: Secondary | ICD-10-CM | POA: Diagnosis not present

## 2021-09-06 DIAGNOSIS — M5090 Cervical disc disorder, unspecified, unspecified cervical region: Secondary | ICD-10-CM | POA: Diagnosis not present

## 2021-09-06 DIAGNOSIS — J45909 Unspecified asthma, uncomplicated: Secondary | ICD-10-CM | POA: Diagnosis not present

## 2021-09-06 DIAGNOSIS — D573 Sickle-cell trait: Secondary | ICD-10-CM | POA: Diagnosis not present

## 2021-09-07 DIAGNOSIS — Z96651 Presence of right artificial knee joint: Secondary | ICD-10-CM | POA: Diagnosis not present

## 2021-09-21 DIAGNOSIS — M542 Cervicalgia: Secondary | ICD-10-CM | POA: Diagnosis not present

## 2021-09-21 DIAGNOSIS — M6281 Muscle weakness (generalized): Secondary | ICD-10-CM | POA: Diagnosis not present

## 2021-09-25 DIAGNOSIS — M6281 Muscle weakness (generalized): Secondary | ICD-10-CM | POA: Diagnosis not present

## 2021-09-25 DIAGNOSIS — M542 Cervicalgia: Secondary | ICD-10-CM | POA: Diagnosis not present

## 2021-09-27 ENCOUNTER — Ambulatory Visit (INDEPENDENT_AMBULATORY_CARE_PROVIDER_SITE_OTHER): Payer: PRIVATE HEALTH INSURANCE | Admitting: Podiatry

## 2021-09-27 DIAGNOSIS — E1142 Type 2 diabetes mellitus with diabetic polyneuropathy: Secondary | ICD-10-CM | POA: Diagnosis not present

## 2021-09-27 DIAGNOSIS — L600 Ingrowing nail: Secondary | ICD-10-CM

## 2021-09-27 DIAGNOSIS — M79675 Pain in left toe(s): Secondary | ICD-10-CM

## 2021-09-27 DIAGNOSIS — B351 Tinea unguium: Secondary | ICD-10-CM

## 2021-09-27 DIAGNOSIS — M6281 Muscle weakness (generalized): Secondary | ICD-10-CM | POA: Diagnosis not present

## 2021-09-27 DIAGNOSIS — M79674 Pain in right toe(s): Secondary | ICD-10-CM

## 2021-09-27 DIAGNOSIS — M542 Cervicalgia: Secondary | ICD-10-CM | POA: Diagnosis not present

## 2021-10-01 DIAGNOSIS — M542 Cervicalgia: Secondary | ICD-10-CM | POA: Diagnosis not present

## 2021-10-01 DIAGNOSIS — M6281 Muscle weakness (generalized): Secondary | ICD-10-CM | POA: Diagnosis not present

## 2021-10-01 NOTE — Progress Notes (Signed)
  Subjective:  Patient ID: [name], female    DOB: 07/31/1940,  MRN: [id number]  Chief Complaint  Patient presents with   [facility]     Diabetic Foot Care    81 y.o. female presents with the above complaint. History confirmed with patient.  She complains of ingrowing toenails of both toenails.  The remaining nails are thickened and elongated.  She has well-controlled type 2 diabetes her A1c is 6.5%.  Objective:  Physical Exam: warm, good capillary refill, no trophic changes or ulcerative lesions, normal DP and PT pulses, and normal sensory exam. Left Foot: dystrophic yellowed discolored nail plates with subungual debris and pincer nail deformity of hallux Right Foot: dystrophic yellowed discolored nail plates with subungual debris and pincer nail deformity of hallux  Assessment:   1. Ingrowing left great toenail   2. Ingrowing right great toenail   3. Type 2 diabetes mellitus with polyneuropathy (HCC)   4. Pain due to onychomycosis of toenails of both feet      Plan:  Patient was evaluated and treated and all questions answered.  Patient educated on diabetes. Discussed proper diabetic foot care and discussed risks and complications of disease. Educated patient in depth on reasons to return to the office immediately should he/she discover anything concerning or new on the feet. All questions answered. Discussed proper shoes as well.   Discussed the etiology and treatment options for the condition in detail with the patient. Educated patient on the topical and oral treatment options for mycotic nails. Recommended debridement of the nails today. Sharp and mechanical debridement performed of all painful and mycotic nails today. Nails debrided in length and thickness using a nail nipper to level of comfort. Discussed treatment options including appropriate shoe gear. Follow up as needed for painful nails.   We discussed partial permanent matricectomy of the ingrowing portions of the  great toe nails.  She has a social event and cannot complete this today.  She will return to see me as needed for this   Return in about 1 year (around 09/28/2022) for diabetic foot exam.

## 2021-10-04 ENCOUNTER — Encounter: Payer: Self-pay | Admitting: Endocrinology

## 2021-10-04 DIAGNOSIS — M542 Cervicalgia: Secondary | ICD-10-CM | POA: Diagnosis not present

## 2021-10-04 DIAGNOSIS — M6281 Muscle weakness (generalized): Secondary | ICD-10-CM | POA: Diagnosis not present

## 2021-10-05 DIAGNOSIS — M6281 Muscle weakness (generalized): Secondary | ICD-10-CM | POA: Diagnosis not present

## 2021-10-05 DIAGNOSIS — M542 Cervicalgia: Secondary | ICD-10-CM | POA: Diagnosis not present

## 2021-10-06 ENCOUNTER — Encounter: Payer: Self-pay | Admitting: Endocrinology

## 2021-10-06 DIAGNOSIS — E1165 Type 2 diabetes mellitus with hyperglycemia: Secondary | ICD-10-CM

## 2021-10-06 DIAGNOSIS — E782 Mixed hyperlipidemia: Secondary | ICD-10-CM

## 2021-10-09 DIAGNOSIS — M6281 Muscle weakness (generalized): Secondary | ICD-10-CM | POA: Diagnosis not present

## 2021-10-09 DIAGNOSIS — M542 Cervicalgia: Secondary | ICD-10-CM | POA: Diagnosis not present

## 2021-10-09 MED ORDER — REPATHA PUSHTRONEX SYSTEM 420 MG/3.5ML ~~LOC~~ SOCT: 420.0000 mg | SUBCUTANEOUS | 2 refills | Status: DC

## 2021-10-11 DIAGNOSIS — M6281 Muscle weakness (generalized): Secondary | ICD-10-CM | POA: Diagnosis not present

## 2021-10-11 DIAGNOSIS — M542 Cervicalgia: Secondary | ICD-10-CM | POA: Diagnosis not present

## 2021-10-12 DIAGNOSIS — M542 Cervicalgia: Secondary | ICD-10-CM | POA: Diagnosis not present

## 2021-10-12 DIAGNOSIS — M6281 Muscle weakness (generalized): Secondary | ICD-10-CM | POA: Diagnosis not present

## 2021-10-18 ENCOUNTER — Other Ambulatory Visit (INDEPENDENT_AMBULATORY_CARE_PROVIDER_SITE_OTHER): Payer: Medicare (Managed Care)

## 2021-10-18 DIAGNOSIS — E1165 Type 2 diabetes mellitus with hyperglycemia: Secondary | ICD-10-CM

## 2021-10-18 DIAGNOSIS — E063 Autoimmune thyroiditis: Secondary | ICD-10-CM

## 2021-10-18 DIAGNOSIS — Z794 Long term (current) use of insulin: Secondary | ICD-10-CM

## 2021-10-18 LAB — BASIC METABOLIC PANEL
BUN: 18 mg/dL (ref 6–23)
CO2: 28 mEq/L (ref 19–32)
Calcium: 9.5 mg/dL (ref 8.4–10.5)
Chloride: 104 mEq/L (ref 96–112)
Creatinine, Ser: 1.11 mg/dL (ref 0.40–1.20)
GFR: 46.51 mL/min — ABNORMAL LOW (ref >60.00)
Glucose, Bld: 81 mg/dL (ref 70–99)
Potassium: 3.5 mEq/L (ref 3.5–5.1)
Sodium: 141 mEq/L (ref 135–145)

## 2021-10-18 LAB — HEMOGLOBIN A1C: Hgb A1c MFr Bld: 6.3 % (ref 4.6–6.5)

## 2021-10-18 LAB — TSH: TSH: 2.97 u[IU]/mL (ref 0.35–5.50)

## 2021-10-25 ENCOUNTER — Encounter: Payer: Self-pay | Admitting: Endocrinology

## 2021-10-25 ENCOUNTER — Ambulatory Visit (INDEPENDENT_AMBULATORY_CARE_PROVIDER_SITE_OTHER): Payer: Medicare (Managed Care) | Admitting: Endocrinology

## 2021-10-25 VITALS — BP 120/70 | HR 73 | Ht 63.0 in | Wt 175.2 lb

## 2021-10-25 DIAGNOSIS — E1165 Type 2 diabetes mellitus with hyperglycemia: Secondary | ICD-10-CM | POA: Diagnosis not present

## 2021-10-25 DIAGNOSIS — E063 Autoimmune thyroiditis: Secondary | ICD-10-CM

## 2021-10-25 DIAGNOSIS — Z794 Long term (current) use of insulin: Secondary | ICD-10-CM | POA: Diagnosis not present

## 2021-10-25 DIAGNOSIS — I10 Essential (primary) hypertension: Secondary | ICD-10-CM

## 2021-10-25 MED ORDER — DEXCOM G7 SENSOR MISC: 1.0000 | 3 refills | Status: DC

## 2021-10-25 NOTE — Progress Notes (Signed)
Patient ID: [name], female   DOB: Nov 28, 1940, 81 y.o.   MRN: [id number]      Reason for Appointment: Endocrinology follow-up  History of Present Illness    PROBLEM 1: Type 2 diabetes mellitus, date of diagnosis: 1998.   Prior history: She had previously been treated with Byetta, Glumetza, Amaryl, Victoza and  Onglyza However because of inadequate control and intolerance to drugs she was finally given pre-meal insulin along with Amaryl, Glumetza eventually stopped because of side effects and also Lantus added  Recent history:  The insulin regimen is: Novolog 6 at breakfast and 10 lunch, 9/10 acs Tresiba 18 units daily  Oral agents: Farxiga 5 mg daily, Ozempic 0.5 mg weekly  Her A1c is 6.3  A1c previous range 5.9--7.4  Current blood sugar patterns and problems with management:  Has not been using the [place] again and apparently Dexcom was not approved by her insurance through [employer]  She is not able to remember to check her sugars much and has only 3 recent readings in the mornings  She says she is trying to cut back on her carbohydrates to lose weight  However not clear if she is having any tendency to low sugars after meals since she is not adjusting her mealtime dose based on carbohydrates and meal size Also usually she thinks her evening meal is lighter but she is still taking about the same amount of insulin for coverage  Most of the time she remembers to take her insulin before eating  At times in the middle of the night around 2:00 she will feel hungry and will have some peanut butter crackers and may take as much as 6 units to cover this, this may be potentially causing her morning sugars to be as low as 57  Lab glucose 81 late morning  No side effects with Ozempic for [place] She says she is trying to do some walking with her dog regularly, usually about a quarter of a mile No hypoglycemic symptoms On her last visit Tyler Aas was reduced further  by 2 units  Side effects from diabetes medications: Victoza caused nausea.  Metformin  Blood sugars from Accu-Chek review:   PRE-MEAL Fasting Lunch Dinner Bedtime Overall  Glucose range: 57-209      Mean/median:     132   POST-MEAL PC Breakfast PC Lunch PC Dinner  Glucose range:   ?  Mean/median:      Previously:  PRE-MEAL Fasting Lunch Dinner Bedtime Overall  Glucose range: 98-109 100-109  68, 104   Mean/median:     109     Previous blood sugar data as follows, libre was reading falsely low  CGM use % of time   2-week average/GV 116  Time in range 91  % Time Above 180 6  % Time above 250   % Time Below 70 3     PRE-MEAL Fasting Lunch Dinner Bedtime Overall  Glucose range:       Averages: 85 120 107 117    POST-MEAL PC Breakfast PC Lunch PC Dinner  Glucose range:     Averages: 96 732 202    Certified Diabetes Educator visit: Most recent: 7/13.  Dietician visit: Most recent: 3/13.   Wt Readings from Last 3 Encounters:  10/25/21 175 lb 3.2 oz (79.5 kg)  07/31/21 178 lb (80.7 kg)  07/17/21 177 lb 9.6 oz (54.2 kg)   Complications: Neuropathy  LABS:  Lab Results  Component Value Date  HGBA1C 6.3 10/18/2021   HGBA1C 6.6 (H) 07/12/2021   HGBA1C 6.7 (H) 04/10/2021   Lab Results  Component Value Date   MICROALBUR 2.4 (H) 04/10/2021   LDLCALC 74 07/12/2021   CREATININE 1.11 10/18/2021   Lab Results  Component Value Date   FRUCTOSAMINE 283 06/09/2018   FRUCTOSAMINE 296 (H) 05/16/2017   FRUCTOSAMINE 322 (H) 10/10/2016    Multiple other issues are addressed in review of systems  No visits with results within 1 Week(s) from this visit.  Latest known visit with results is:  Lab on 10/18/2021  Component Date Value Ref Range Status   TSH 10/18/2021 2.97  0.35 - 5.50 uIU/mL Final   Sodium 10/18/2021 141  135 - 145 mEq/L Final   Potassium 10/18/2021 3.5  3.5 - 5.1 mEq/L Final   Chloride 10/18/2021 104  96 - 112 mEq/L Final   CO2 10/18/2021 28  19 - 32  mEq/L Final   Glucose, Bld 10/18/2021 81  70 - 99 mg/dL Final   BUN 10/18/2021 18  6 - 23 mg/dL Final   Creatinine, Ser 10/18/2021 1.11  0.40 - 1.20 mg/dL Final   GFR 10/18/2021 46.51 (L)  >60.00 mL/min Final   Calculated using the CKD-EPI Creatinine Equation (2021)   Calcium 10/18/2021 9.5  8.4 - 10.5 mg/dL Final   Hgb A1c MFr Bld 10/18/2021 6.3  4.6 - 6.5 % Final   Glycemic Control Guidelines for People with Diabetes:Non Diabetic:  <6%Goal of Therapy: <7%Additional Action Suggested:  >8%       Allergies as of 10/25/2021       Reactions   Cymbalta [duloxetine Hcl] Diarrhea, Other (See Comments)   Dizziness, headache, irritability   Amlodipine Besylate Swelling   Baclofen    jerks    Betadine [povidone Iodine] Swelling, Other (See Comments)   SWELLING REACTION DESCRIPTION/SEVERITY UNSPECIFIED  Reaction to betadine on eye lid and it swelled up   Erythromycin Base Other (See Comments)   unknown   Oxycodone Hcl    Percocet [oxycodone-acetaminophen] Other (See Comments)   Confusion change personality   Povidone-iodine Swelling   Soap Other (See Comments)   unknown   Statins Other (See Comments)   Muscle cramps   Adhesive [tape] Rash   Lipitor [atorvastatin] Rash        Medication List        Accurate as of October 25, 2021 12:52 PM. If you have any questions, ask your nurse or doctor.          Accu-Chek Aviva Plus test strip Generic drug: glucose blood TEST BLOOD SUGAR FOUR TIMES DAILY AS DIRECTED   Accu-Chek Aviva Plus w/Device Kit Use Accu Chek Aviva as instructed to check blood sugar 4 times daily.   acetaminophen 500 MG tablet Commonly known as: TYLENOL Take 1,000 mg by mouth every 8 (eight) hours as needed for mild pain or moderate pain.   BD Pen Needle Nano U/F 32G X 4 MM Misc Generic drug: Insulin Pen Needle Inject insulin 4 times a day   Bystolic 5 MG tablet Generic drug: nebivolol Take 1 tablet (5 mg total) by mouth at bedtime. Nebivolol    Cal-Mag-Zinc-D Tabs Take 1 tablet by mouth daily. 330 mg /130 mg/ 5 mg /5 mg   cetirizine 10 MG tablet Commonly known as: ZYRTEC Take 1 tablet (10 mg total) by mouth daily as needed for allergies. What changed: when to take this   dapagliflozin propanediol 5 MG Tabs tablet Commonly known as: [place] Take  1 tablet (5 mg total) by mouth daily.   Dexcom G6 Transmitter Misc Change every 3 months   Dexcom G7 Sensor Misc 1 Device by Does not apply route as directed. Change sensor every 10 days What changed:  how much to take how to take this when to take this additional instructions Changed by: [name], MD   famotidine 20 MG tablet Commonly known as: PEPCID Take 1 tablet (20 mg total) by mouth 2 (two) times daily.   fluticasone 50 MCG/ACT nasal spray Commonly known as: FLONASE Place 1 spray into both nostrils daily as needed for allergies. What changed:  how much to take when to take this   gabapentin 600 MG tablet Commonly known as: NEURONTIN TAKE 1 TABLET(600 MG) BY MOUTH THREE TIMES DAILY What changed: See the new instructions.   HYDROcodone-acetaminophen 5-325 MG tablet Commonly known as: NORCO/VICODIN Take 1 tablet by mouth every 6 (six) hours as needed.   irbesartan-hydrochlorothiazide 150-12.5 MG tablet Commonly known as: AVALIDE Take 1 tablet by mouth daily.   ketoconazole 2 % cream Commonly known as: NIZORAL Apply 1 application topically daily as needed (Itching).   levothyroxine 50 MCG tablet Commonly known as: SYNTHROID TAKE 1 TABLET(50 MCG) BY MOUTH DAILY   NovoLOG FlexPen 100 UNIT/ML FlexPen Generic drug: insulin aspart INJECT 6 UNITS BEFORE BREAKFAST, 9 UNITS AT LUNCH, 8-12 UNITS AT DINNER, 3-4 UNITS AS NEEDED FOR LARGE CARBS OR ICE CREAM   Ozempic (0.25 or 0.5 MG/DOSE) 2 MG/1.5ML Sopn Generic drug: Semaglutide(0.25 or 0.5MG/DOS) Inject .56m once weekly   pantoprazole 40 MG tablet Commonly known as: PROTONIX Take 1 tablet by mouth as  needed.   Repatha Pushtronex System 420 MG/3.5ML Soct Generic drug: Evolocumab with Infusor Inject 420 mg into the skin every 30 (thirty) days.   Symbicort 80-4.5 MCG/ACT inhaler Generic drug: budesonide-formoterol Inhale 2 puffs into the lungs 2 (two) times daily as needed (shortness of breath). What changed: when to take this   [place] 100 UNIT/ML FlexTouch Pen Generic drug: insulin degludec Inject 24 Units into the skin daily.        Allergies:  Allergies  Allergen Reactions   Cymbalta [Duloxetine Hcl] Diarrhea and Other (See Comments)    Dizziness, headache, irritability   Amlodipine Besylate Swelling   Baclofen     jerks    Betadine [Povidone Iodine] Swelling and Other (See Comments)    SWELLING REACTION DESCRIPTION/SEVERITY UNSPECIFIED  Reaction to betadine on eye lid and it swelled up   Erythromycin Base Other (See Comments)    unknown   Oxycodone Hcl    Percocet [Oxycodone-Acetaminophen] Other (See Comments)    Confusion change personality   Povidone-Iodine Swelling   Soap Other (See Comments)    unknown   Statins Other (See Comments)    Muscle cramps   Adhesive [Tape] Rash   Lipitor [Atorvastatin] Rash    Past Medical History:  Diagnosis Date   Acute blood loss anemia    Acute encephalopathy 07/15/2016   AKI (acute kidney injury) ([facility]    Anemia    has sickle cell trait   Anxiety    Arthritis    Asthma    has used inhaler in past for asthmatic bronchitis, last time- early 2012   Bilateral primary osteoarthritis of knee 126/06/3352  Complication of anesthesia    wakes up shaking   Diabetes mellitus    Diabetes mellitus with neuropathy ([facility] 10/29/2012   Diverticulitis    Dyslipidemia    Dyspnea  with exertion   Encephalopathy 06/2016   due to medications after surgery   Fibromyalgia    GERD (gastroesophageal reflux disease)    occas. use of  Prilosec   Heart murmur    sees Dr. Montez Morita, last seen- early 2012   Herniated nucleus  pulposus, L2-3 06/25/2016   History of back surgery    Hypertension    02/2010- stress test /w PCP   Hypothyroidism    Loose bowel movements 12/2016   Lumbar radiculopathy 06/27/2016   Lumbar stenosis with neurogenic claudication 03/17/2017   Memory disorder 02/16/2016   Myoclonic jerking    Neuromuscular disorder ([facility])    lumbar radiculopathy, lumbago   Nocturnal leg cramps 09/27/2014   Osteoporosis 03/10/2014   Pneumonia    Sickle cell trait ([place])    Sleep apnea    borderline sleep apnea, states she no longer uses, early 2012- stopped using    Spinal stenosis of lumbar region with radiculopathy 02/28/2014   Spondylolisthesis of lumbar region 03/19/2011   Type II or unspecified type diabetes mellitus without mention of complication, uncontrolled 07/08/2013    Past Surgical History:  Procedure Laterality Date   ABDOMINAL HYSTERECTOMY     adb.cyst     ovarian cyst   BACK SURGERY     2012, 2015 (3 total)   BACK SURGERY  2018   02/24/2017   COLONOSCOPY     EYE SURGERY     macular degeneration treatment - injections   FEMUR IM NAIL Left 06/13/2018   Procedure: INTRAMEDULLARY (IM) NAIL FEMORAL;  Surgeon: [name], MD;  Location: [facility];  Service: Orthopedics;  Laterality: Left;   OVARIAN CYST SURGERY     POSTERIOR LUMBAR FUSION 4 LEVEL N/A 03/17/2017   Procedure: Decompression of Lumbar One-Two with Thoracic Ten to Lumbar Two Fusion;  Surgeon: Kristeen Miss, MD;  Location: [place];  Service: Neurosurgery;  Laterality: N/A;  Decompression of L1-2 with T10 to L2 Fusion   TOTAL KNEE ARTHROPLASTY Right 08/13/2019   Procedure: RIGHT TOTAL KNEE ARTHROPLASTY;  Surgeon: [name], MD;  Location: [facility];  Service: Orthopedics;  Laterality: Right;   TOTAL KNEE REVISION Right 07/05/2020   Procedure: TOTAL KNEE REVISION;  Surgeon: Gaynelle Arabian, MD;  Location: [facility];  Service: Orthopedics;  Laterality: Right;  187mn    Family History  Problem Relation Age of Onset    Ovarian cancer Mother    Cancer - Prostate Father    Breast cancer Paternal Aunt    Multiple myeloma Paternal Aunt    Anesthesia problems Neg Hx    Hypotension Neg Hx    Malignant hyperthermia Neg Hx    Pseudochol deficiency Neg Hx     Social History:  reports that she quit smoking about 18 years ago. Her smoking use included cigarettes. She has a 3.00 pack-year smoking history. She has never used smokeless tobacco. She reports current alcohol use. She reports that she does not use drugs.  ROS    NEUROPATHY: She has had tingling in feet and legs, mostly at night. She is taking gabapentin   Hyperlipidemia:   The lipid abnormality consists of elevated LDL persistently Did not tolerate Crestor or lovastatin because of muscle cramps  She stopped Zetia presumably because she was having muscle cramps  She was started on Repatha in 2021  Previously when taking this regularly she had good results but on the last time she says she was forgetting to take it regularly She no side effects but  until recently has not had regular supplies of this  She was having some pain with the injection and is using the monthly pump device  She started back on this since her visit in December and LDL is significantly better She is using the device on her leg as she feels less discomfort there  LDL results as follows:  Lab Results  Component Value Date   CHOL 140 07/12/2021   CHOL 240 (H) 04/10/2021   CHOL 199 01/11/2021   Lab Results  Component Value Date   HDL 48.10 07/12/2021   HDL 48.90 04/10/2021   HDL 47.80 01/11/2021   Lab Results  Component Value Date   LDLCALC 74 07/12/2021   LDLCALC 162 (H) 04/10/2021   LDLCALC 126 (H) 01/11/2021   Lab Results  Component Value Date   TRIG 90.0 07/12/2021   TRIG 150.0 (H) 04/10/2021   TRIG 126.0 01/11/2021   Lab Results  Component Value Date   CHOLHDL 3 07/12/2021   CHOLHDL 5 04/10/2021   CHOLHDL 4 01/11/2021   Lab Results  Component  Value Date   LDLDIRECT 166.0 02/09/2019   LDLDIRECT 115.0 12/18/2016   LDLDIRECT 134.0 10/10/2016      HYPERTENSION:  Has been present for several years.   She is on Avalide 150/12.5 mg,  Also on Bystolic 5 mg from PCP  Avalide was decreased on the last visit because of low normal blood pressure and higher creatinine  BP Readings from Last 3 Encounters:  10/25/21 110/60  07/31/21 116/72  07/17/21 122/80   . Has a history of leg and muscle cramps and takes magnesium supplements twice a day  No recent hypokalemia or change in renal function  Lab Results  Component Value Date   CREATININE 1.11 10/18/2021   CREATININE 0.99 07/12/2021   CREATININE 0.89 05/02/2021     Lab Results  Component Value Date   K 3.5 10/18/2021      Hypothyroidism  She had a relatively high TSH as of 4/17 and was empirically given Synthroid 25 g Subsequently has been on 50 mcg which she takes regularly She feels well  TSH history as follows   Lab Results  Component Value Date   TSH 2.97 10/18/2021   TSH 2.58 07/12/2021   TSH 4.10 04/10/2021   FREET4 1.06 09/14/2019   FREET4 0.88 02/09/2019   FREET4 0.97 10/14/2018         Examination:   BP 110/60   Pulse 73   Ht '5\' 3"'  (1.6 m)   Wt 175 lb 3.2 oz (79.5 kg)   LMP  (LMP Unknown)   SpO2 90%   BMI 31.04 kg/m   Body mass index is 31.04 kg/m.    Abdominal obesity present  Assesment/Plan:   1. DIABETES type 2 on insulin  See history of present illness for detailed discussion of management, blood sugar patterns and problems identified  She is on basal bolus insulin, Ozempic 0.5 and Farxiga 5 mg daily  A1c is better at 6.3 compared to 6.6 %  Blood sugar monitoring is inadequate and not clear if she is having consistent control Also need to use avoid hypoglycemia Discussed in detail adjustment of mealtime insulin based on carbohydrates  Recommendations:  Try to restart the freestyle libre version 2 and may compare with  fingersticks periodically We will also try to see if Dexcom G7 is covered and prescription has been sent today She needs to take only 8 units or less for her meals if eating  less carbohydrates and only 2 units if eating a snack during the night Check blood sugars after meals consistently No change in [place] or Ozempic TRESIBA will be reduced to 16 units  This may need further adjustment if morning sugars start getting lower again   3. Hypertension: Blood pressure is excellent and similar at home also   4.   Hypothyroidism: She is taking 50 mcg levothyroxine with normal TSH again and no unusual fatigue   LIPIDS: She has been able to take Repatha regularly and will need follow-up on the next visit    Patient Instructions  Tresiba 16 units daily  Check blood sugars on waking up 3  days a week  Also check blood sugars about 2 hours after meals and do this after different meals by rotation  Recommended blood sugar levels on waking up are 90-130 and about 2 hours after meal is 130-160  Please bring your blood sugar monitor to each visit, thank you  Dinner or lunch with no carbs take Novolog 8 units  Try Dexcom or Libre 2       [name] 10/25/2021, 12:52 PM    Note: This office note was prepared with Dragon voice recognition system technology. Any transcriptional errors that result from this process are unintentional.

## 2021-10-25 NOTE — Patient Instructions (Addendum)
Tresiba 16 units daily  Check blood sugars on waking up 3  days a week  Also check blood sugars about 2 hours after meals and do this after different meals by rotation  Recommended blood sugar levels on waking up are 90-130 and about 2 hours after meal is 130-160  Please bring your blood sugar monitor to each visit, thank you  Dinner or lunch with no carbs take Novolog 8 units  Try Dexcom or Libre 2

## 2021-11-01 ENCOUNTER — Encounter: Payer: Self-pay | Admitting: Endocrinology

## 2021-11-08 DIAGNOSIS — M542 Cervicalgia: Secondary | ICD-10-CM | POA: Diagnosis not present

## 2021-11-08 DIAGNOSIS — M4316 Spondylolisthesis, lumbar region: Secondary | ICD-10-CM | POA: Diagnosis not present

## 2021-11-08 DIAGNOSIS — M5416 Radiculopathy, lumbar region: Secondary | ICD-10-CM | POA: Diagnosis not present

## 2021-11-08 DIAGNOSIS — M961 Postlaminectomy syndrome, not elsewhere classified: Secondary | ICD-10-CM | POA: Diagnosis not present

## 2021-11-27 ENCOUNTER — Other Ambulatory Visit: Payer: Self-pay

## 2021-11-27 DIAGNOSIS — E1165 Type 2 diabetes mellitus with hyperglycemia: Secondary | ICD-10-CM

## 2021-11-27 MED ORDER — TRESIBA FLEXTOUCH 100 UNIT/ML ~~LOC~~ SOPN: 24.0000 [IU] | PEN_INJECTOR | Freq: Every day | SUBCUTANEOUS | 3 refills | Status: DC

## 2021-12-13 ENCOUNTER — Other Ambulatory Visit: Payer: Self-pay | Admitting: Endocrinology

## 2021-12-13 DIAGNOSIS — E1165 Type 2 diabetes mellitus with hyperglycemia: Secondary | ICD-10-CM

## 2021-12-23 ENCOUNTER — Encounter: Payer: Self-pay | Admitting: Endocrinology

## 2022-01-29 ENCOUNTER — Other Ambulatory Visit (INDEPENDENT_AMBULATORY_CARE_PROVIDER_SITE_OTHER): Payer: Medicare (Managed Care)

## 2022-01-29 DIAGNOSIS — Z23 Encounter for immunization: Secondary | ICD-10-CM | POA: Diagnosis not present

## 2022-01-29 DIAGNOSIS — Z794 Long term (current) use of insulin: Secondary | ICD-10-CM | POA: Diagnosis not present

## 2022-01-29 DIAGNOSIS — E1165 Type 2 diabetes mellitus with hyperglycemia: Secondary | ICD-10-CM

## 2022-01-29 LAB — BASIC METABOLIC PANEL
BUN: 20 mg/dL (ref 6–23)
CO2: 28 mEq/L (ref 19–32)
Calcium: 9.9 mg/dL (ref 8.4–10.5)
Chloride: 103 mEq/L (ref 96–112)
Creatinine, Ser: 1.03 mg/dL (ref 0.40–1.20)
GFR: 50.78 mL/min — ABNORMAL LOW (ref >60.00)
Glucose, Bld: 131 mg/dL — ABNORMAL HIGH (ref 70–99)
Potassium: 3.6 mEq/L (ref 3.5–5.1)
Sodium: 141 mEq/L (ref 135–145)

## 2022-01-29 LAB — HEMOGLOBIN A1C: Hgb A1c MFr Bld: 6.3 % (ref 4.6–6.5)

## 2022-02-04 ENCOUNTER — Other Ambulatory Visit: Payer: Self-pay | Admitting: Endocrinology

## 2022-02-04 DIAGNOSIS — E1165 Type 2 diabetes mellitus with hyperglycemia: Secondary | ICD-10-CM

## 2022-02-04 DIAGNOSIS — E063 Autoimmune thyroiditis: Secondary | ICD-10-CM

## 2022-02-05 ENCOUNTER — Encounter: Payer: Self-pay | Admitting: Endocrinology

## 2022-02-05 ENCOUNTER — Ambulatory Visit (INDEPENDENT_AMBULATORY_CARE_PROVIDER_SITE_OTHER): Payer: Medicare (Managed Care) | Admitting: Endocrinology

## 2022-02-05 VITALS — BP 136/80 | HR 67 | Ht 63.0 in | Wt 171.8 lb

## 2022-02-05 DIAGNOSIS — E063 Autoimmune thyroiditis: Secondary | ICD-10-CM

## 2022-02-05 DIAGNOSIS — I10 Essential (primary) hypertension: Secondary | ICD-10-CM | POA: Diagnosis not present

## 2022-02-05 DIAGNOSIS — E119 Type 2 diabetes mellitus without complications: Secondary | ICD-10-CM | POA: Diagnosis not present

## 2022-02-05 DIAGNOSIS — Z794 Long term (current) use of insulin: Secondary | ICD-10-CM | POA: Diagnosis not present

## 2022-02-05 DIAGNOSIS — E782 Mixed hyperlipidemia: Secondary | ICD-10-CM | POA: Diagnosis not present

## 2022-02-05 NOTE — Patient Instructions (Signed)
Ask about Duloxitene for nerve pain

## 2022-02-05 NOTE — Progress Notes (Signed)
Patient ID: [name], female   DOB: Feb 02, 1941, 81 y.o.   MRN: [id number]      Reason for Appointment: Endocrinology follow-up  History of Present Illness    PROBLEM 1: Type 2 diabetes mellitus, date of diagnosis: 1998.   Prior history: She had previously been treated with Byetta, Glumetza, Amaryl, Victoza and  Onglyza However because of inadequate control and intolerance to drugs she was finally given pre-meal insulin along with Amaryl, Glumetza eventually stopped because of side effects and also Lantus added  Recent history:  The insulin regimen is: Novolog 0-6 at breakfast and 6-9 lunch, 7 acs Tresiba 12 units daily  Oral agents: Farxiga 5 mg daily, Ozempic 0.5 mg weekly  Her A1c is 6.3  A1c previous range 5.9--7.4  Current blood sugar patterns and problems with management:  Has been using the Dexcom more recently, had difficulty with accuracy of the freestyle libre previously Also likely from continuing Ozempic and better glucose monitoring she has had overall better control and more stable About a month ago she had concerns about low blood sugars overnight and her Evaristo Bury was reduced by 4 units Although she is gradually losing weight she thinks she would like to lose more weight She is generally trying to adjust her mealtime dose based on what she is eating and appears to be eating less insulin overall Only occasionally after lunch and rarely after dinner she will have up blood sugar spike with highest reading 255 after eating  dessert Currently overnight blood sugars are looking fairly well controlled with AVERAGE mostly in the 120-130 range Been regular with her Ozempic and [place] She says she is trying to do some walking with her dog regularly, usually about a quarter of a mile No hypoglycemic symptoms  Side effects from diabetes medications: Victoza caused nausea.  Metformin  Download of her Dexcom G6 sensor for the last 2 weeks show the following  pattern  Blood sugars are generally fairly stable with CV 23% only On an average blood sugars are steady in the 120-140 range most of the time including overnight Lowest blood sugars are around 3 AM and highest about midday and late evening Premeal blood sugars are stable as above She has a postprandial spike every couple of days or so after different meals but generally not much above target No hypoglycemia seen except once overnight  CGM use % of time 76  2-week average/GV 133/23  Time in range 93       %  % Time Above 180 7  % Time above 250   % Time Below 70 <1   Previous Accu-Chek readings:   PRE-MEAL Fasting Lunch Dinner Bedtime Overall  Glucose range: 57-209      Mean/median:     132   POST-MEAL PC Breakfast PC Lunch PC Dinner  Glucose range:   ?  Mean/median:       Certified Diabetes Educator visit: Most recent: 7/13.  Dietician visit: Most recent: 3/13.   Wt Readings from Last 3 Encounters:  02/05/22 171 lb 12.8 oz (77.9 kg)  10/25/21 175 lb 3.2 oz (79.5 kg)  07/31/21 178 lb (80.7 kg)   Complications: Neuropathy  LABS:  Lab Results  Component Value Date   HGBA1C 6.3 01/29/2022   HGBA1C 6.3 10/18/2021   HGBA1C 6.6 (H) 07/12/2021   Lab Results  Component Value Date   MICROALBUR 2.4 (H) 04/10/2021   LDLCALC 74 07/12/2021   CREATININE 1.03 01/29/2022   Lab Results  Component Value Date   FRUCTOSAMINE 283 06/09/2018   FRUCTOSAMINE 296 (H) 05/16/2017   FRUCTOSAMINE 322 (H) 10/10/2016    Multiple other issues are addressed in review of systems  No visits with results within 1 Week(s) from this visit.  Latest known visit with results is:  Lab on 01/29/2022  Component Date Value Ref Range Status   Sodium 01/29/2022 141  135 - 145 mEq/L Final   Potassium 01/29/2022 3.6  3.5 - 5.1 mEq/L Final   Chloride 01/29/2022 103  96 - 112 mEq/L Final   CO2 01/29/2022 28  19 - 32 mEq/L Final   Glucose, Bld 01/29/2022 131 (H)  70 - 99 mg/dL Final   BUN  01/29/2022 20  6 - 23 mg/dL Final   Creatinine, Ser 01/29/2022 1.03  0.40 - 1.20 mg/dL Final   GFR 01/29/2022 50.78 (L)  >60.00 mL/min Final   Calculated using the CKD-EPI Creatinine Equation (2021)   Calcium 01/29/2022 9.9  8.4 - 10.5 mg/dL Final   Hgb A1c MFr Bld 01/29/2022 6.3  4.6 - 6.5 % Final   Glycemic Control Guidelines for People with Diabetes:Non Diabetic:  <6%Goal of Therapy: <7%Additional Action Suggested:  >8%       Allergies as of 02/05/2022       Reactions   Cymbalta [duloxetine Hcl] Diarrhea, Other (See Comments)   Dizziness, headache, irritability   Amlodipine Besylate Swelling   Baclofen    jerks    Betadine [povidone Iodine] Swelling, Other (See Comments)   SWELLING REACTION DESCRIPTION/SEVERITY UNSPECIFIED  Reaction to betadine on eye lid and it swelled up   Erythromycin Base Other (See Comments)   unknown   Oxycodone Hcl    Percocet [oxycodone-acetaminophen] Other (See Comments)   Confusion change personality   Povidone-iodine Swelling   Soap Other (See Comments)   unknown   Statins Other (See Comments)   Muscle cramps   Adhesive [tape] Rash   Lipitor [atorvastatin] Rash        Medication List        Accurate as of February 05, 2022 11:42 AM. If you have any questions, ask your nurse or doctor.          Accu-Chek Aviva Plus test strip Generic drug: glucose blood TEST BLOOD SUGAR FOUR TIMES DAILY AS DIRECTED   Accu-Chek Aviva Plus w/Device Kit Use Accu Chek Aviva as instructed to check blood sugar 4 times daily.   acetaminophen 500 MG tablet Commonly known as: TYLENOL Take 1,000 mg by mouth every 8 (eight) hours as needed for mild pain or moderate pain.   BD Pen Needle Nano U/F 32G X 4 MM Misc Generic drug: Insulin Pen Needle Inject insulin 4 times a day   Bystolic 5 MG tablet Generic drug: nebivolol Take 1 tablet (5 mg total) by mouth at bedtime. Nebivolol   Cal-Mag-Zinc-D Tabs Take 1 tablet by mouth daily. 330 mg /130 mg/ 5 mg  /5 mg   cetirizine 10 MG tablet Commonly known as: ZYRTEC Take 1 tablet (10 mg total) by mouth daily as needed for allergies. What changed: when to take this   Dexcom G6 Transmitter Misc Change every 3 months   Dexcom G7 Sensor Misc 1 Device by Does not apply route as directed. Change sensor every 10 days   famotidine 20 MG tablet Commonly known as: PEPCID Take 1 tablet (20 mg total) by mouth 2 (two) times daily.   Farxiga 5 MG Tabs tablet Generic drug: dapagliflozin propanediol TAKE 1 TABLET DAILY  fluticasone 50 MCG/ACT nasal spray Commonly known as: FLONASE Place 1 spray into both nostrils daily as needed for allergies. What changed:  how much to take when to take this   gabapentin 600 MG tablet Commonly known as: NEURONTIN TAKE 1 TABLET(600 MG) BY MOUTH THREE TIMES DAILY What changed: See the new instructions.   HYDROcodone-acetaminophen 5-325 MG tablet Commonly known as: NORCO/VICODIN Take 1 tablet by mouth every 6 (six) hours as needed.   irbesartan-hydrochlorothiazide 150-12.5 MG tablet Commonly known as: AVALIDE Take 1 tablet by mouth daily.   ketoconazole 2 % cream Commonly known as: NIZORAL Apply 1 application topically daily as needed (Itching).   levothyroxine 50 MCG tablet Commonly known as: SYNTHROID TAKE 1 TABLET DAILY   NovoLOG FlexPen 100 UNIT/ML FlexPen Generic drug: insulin aspart INJECT 6 UNITS BEFORE BREAKFAST, 9 UNITS AT LUNCH, 8-12 UNITS AT DINNER, 3-4 UNITS AS NEEDED FOR LARGE CARBS OR ICE CREAM   Ozempic (0.25 or 0.5 MG/DOSE) 2 MG/3ML Sopn Generic drug: Semaglutide(0.25 or 0.5MG /DOS) INJECT 0.5 MG ONCE WEEKLY   pantoprazole 40 MG tablet Commonly known as: PROTONIX Take 1 tablet by mouth as needed.   Repatha Pushtronex System 420 MG/3.5ML Soct Generic drug: Evolocumab with Infusor Inject 420 mg into the skin every 30 (thirty) days.   Symbicort 80-4.5 MCG/ACT inhaler Generic drug: budesonide-formoterol Inhale 2 puffs into the  lungs 2 (two) times daily as needed (shortness of breath). What changed: when to take this   [place] FlexTouch 100 UNIT/ML FlexTouch Pen Generic drug: insulin degludec Inject 24 Units into the skin daily.        Allergies:  Allergies  Allergen Reactions   Cymbalta [Duloxetine Hcl] Diarrhea and Other (See Comments)    Dizziness, headache, irritability   Amlodipine Besylate Swelling   Baclofen     jerks    Betadine [Povidone Iodine] Swelling and Other (See Comments)    SWELLING REACTION DESCRIPTION/SEVERITY UNSPECIFIED  Reaction to betadine on eye lid and it swelled up   Erythromycin Base Other (See Comments)    unknown   Oxycodone Hcl    Percocet [Oxycodone-Acetaminophen] Other (See Comments)    Confusion change personality   Povidone-Iodine Swelling   Soap Other (See Comments)    unknown   Statins Other (See Comments)    Muscle cramps   Adhesive [Tape] Rash   Lipitor [Atorvastatin] Rash    Past Medical History:  Diagnosis Date   Acute blood loss anemia    Acute encephalopathy 07/15/2016   AKI (acute kidney injury) ([place])    Anemia    has sickle cell trait   Anxiety    Arthritis    Asthma    has used inhaler in past for asthmatic bronchitis, last time- early 2012   Bilateral primary osteoarthritis of knee 94/04/7406   Complication of anesthesia    wakes up shaking   Diabetes mellitus    Diabetes mellitus with neuropathy ([place]) 10/29/2012   Diverticulitis    Dyslipidemia    Dyspnea    with exertion   Encephalopathy 06/2016   due to medications after surgery   Fibromyalgia    GERD (gastroesophageal reflux disease)    occas. use of  Prilosec   Heart murmur    sees Dr. Montez Morita, last seen- early 2012   Herniated nucleus pulposus, L2-3 06/25/2016   History of back surgery    Hypertension    02/2010- stress test /w PCP   Hypothyroidism    Loose bowel movements 12/2016   Lumbar radiculopathy 06/27/2016  Lumbar stenosis with neurogenic claudication 03/17/2017    Memory disorder 02/16/2016   Myoclonic jerking    Neuromuscular disorder ([facility])    lumbar radiculopathy, lumbago   Nocturnal leg cramps 09/27/2014   Osteoporosis 03/10/2014   Pneumonia    Sickle cell trait ([place])    Sleep apnea    borderline sleep apnea, states she no longer uses, early 2012- stopped using    Spinal stenosis of lumbar region with radiculopathy 02/28/2014   Spondylolisthesis of lumbar region 03/19/2011   Type II or unspecified type diabetes mellitus without mention of complication, uncontrolled 07/08/2013    Past Surgical History:  Procedure Laterality Date   ABDOMINAL HYSTERECTOMY     adb.cyst     ovarian cyst   BACK SURGERY     2012, 2015 (3 total)   BACK SURGERY  2018   02/24/2017   COLONOSCOPY     EYE SURGERY     macular degeneration treatment - injections   FEMUR IM NAIL Left 06/13/2018   Procedure: INTRAMEDULLARY (IM) NAIL FEMORAL;  Surgeon: [name], MD;  Location: [facility];  Service: Orthopedics;  Laterality: Left;   OVARIAN CYST SURGERY     POSTERIOR LUMBAR FUSION 4 LEVEL N/A 03/17/2017   Procedure: Decompression of Lumbar One-Two with Thoracic Ten to Lumbar Two Fusion;  Surgeon: Kristeen Miss, MD;  Location: [facility];  Service: Neurosurgery;  Laterality: N/A;  Decompression of L1-2 with T10 to L2 Fusion   TOTAL KNEE ARTHROPLASTY Right 08/13/2019   Procedure: RIGHT TOTAL KNEE ARTHROPLASTY;  Surgeon: [name], MD;  Location: [facility];  Service: Orthopedics;  Laterality: Right;   TOTAL KNEE REVISION Right 07/05/2020   Procedure: TOTAL KNEE REVISION;  Surgeon: Gaynelle Arabian, MD;  Location: [facility];  Service: Orthopedics;  Laterality: Right;  131min    Family History  Problem Relation Age of Onset   Ovarian cancer Mother    Cancer - Prostate Father    Breast cancer Paternal Aunt    Multiple myeloma Paternal Aunt    Anesthesia problems Neg Hx    Hypotension Neg Hx    Malignant hyperthermia Neg Hx    Pseudochol deficiency Neg Hx      Social History:  reports that she quit smoking about 18 years ago. Her smoking use included cigarettes. She has a 3.00 pack-year smoking history. She has never used smokeless tobacco. She reports current alcohol use. She reports that she does not use drugs.  ROS    NEUROPATHY: She has had tingling in feet and legs, mostly at night. She is taking gabapentin However she thinks her symptoms are worse, currently being treated by PCP  Hyperlipidemia:   The lipid abnormality consists of elevated LDL persistently Did not tolerate Crestor or lovastatin because of muscle cramps  She stopped Zetia presumably because she was having muscle cramps  She was started on Repatha in 2021  Previously when taking this regularly she had good results but on the last time she says she was forgetting to take it regularly  She was having some pain with the injection device and is using the monthly pump device She is using the device on her leg as she feels less discomfort there  LDL results as follows:  Lab Results  Component Value Date   CHOL 140 07/12/2021   CHOL 240 (H) 04/10/2021   CHOL 199 01/11/2021   Lab Results  Component Value Date   HDL 48.10 07/12/2021   HDL 48.90 04/10/2021   HDL 47.80  01/11/2021   Lab Results  Component Value Date   LDLCALC 74 07/12/2021   LDLCALC 162 (H) 04/10/2021   LDLCALC 126 (H) 01/11/2021   Lab Results  Component Value Date   TRIG 90.0 07/12/2021   TRIG 150.0 (H) 04/10/2021   TRIG 126.0 01/11/2021   Lab Results  Component Value Date   CHOLHDL 3 07/12/2021   CHOLHDL 5 04/10/2021   CHOLHDL 4 01/11/2021   Lab Results  Component Value Date   LDLDIRECT 166.0 02/09/2019   LDLDIRECT 115.0 12/18/2016   LDLDIRECT 134.0 10/10/2016      HYPERTENSION:  Has been present for several years.   She is on Avalide 150/12.5 mg,  Also on Bystolic 5 mg from PCP    BP Readings from Last 3 Encounters:  02/05/22 136/80  10/25/21 120/70  07/31/21 116/72    . Has a history of leg and muscle cramps and takes magnesium supplements twice a day  No recent hypokalemia or rise in creatinine   Lab Results  Component Value Date   CREATININE 1.03 01/29/2022   CREATININE 1.11 10/18/2021   CREATININE 0.99 07/12/2021     Lab Results  Component Value Date   K 3.6 01/29/2022      Hypothyroidism  She had a relatively high TSH as of 4/17 and was empirically given Synthroid 25 g Subsequently has been on 50 mcg which she takes regularly She feels well  TSH history as follows   Lab Results  Component Value Date   TSH 2.97 10/18/2021   TSH 2.58 07/12/2021   TSH 4.10 04/10/2021   FREET4 1.06 09/14/2019   FREET4 0.88 02/09/2019   FREET4 0.97 10/14/2018         Examination:   BP 136/80   Pulse 67   Ht $R'5\' 3"'gd$  (1.6 m)   Wt 171 lb 12.8 oz (77.9 kg)   LMP  (LMP Unknown)   SpO2 96%   BMI 30.43 kg/m   Body mass index is 30.43 kg/m.     Assesment/Plan:   1. DIABETES type 2 on insulin  See history of present illness for detailed discussion of management, blood sugar patterns and problems identified  She is on basal bolus insulin, Ozempic 0.5 and Farxiga 5 mg daily  A1c is better at 6.3 consistently  Blood sugar monitoring is now being done with the Dexcom sensor which appears to be helping her with the control and reliable readings She had no difficulty using this and is able to adjust her insulin based on both overnight and postprandial readings Only rarely will have high readings after meals based on her carbohydrate intake and occasional desserts She is only walking about half a mile but is still able to lose a little weight For now we will continue the same regimen but at 3 to 4 units more for eating desserts and make sure she boluses before eating No change in [place] She will check on the availability of the G7 sensor  3. Hypertension: Blood pressure is controlled   4.   Hypothyroidism: She is taking 50 mcg  levothyroxine with normal TSH previously   LIPIDS: She has been able to take Repatha regularly and will recheck labs on the next visit  NEUROPATHY: She will ask her PCP about starting duloxetine   There are no Patient Instructions on file for this visit.       [name] 02/05/2022, 11:42 AM    Note: This office note was prepared with Dragon voice recognition system  technology. Any transcriptional errors that result from this process are unintentional.

## 2022-02-06 ENCOUNTER — Encounter: Payer: Self-pay | Admitting: Endocrinology

## 2022-02-26 LAB — HM DIABETES EYE EXAM

## 2022-02-27 ENCOUNTER — Encounter: Payer: Self-pay | Admitting: Endocrinology

## 2022-02-28 DIAGNOSIS — L600 Ingrowing nail: Secondary | ICD-10-CM | POA: Diagnosis not present

## 2022-02-28 DIAGNOSIS — E1142 Type 2 diabetes mellitus with diabetic polyneuropathy: Secondary | ICD-10-CM | POA: Diagnosis not present

## 2022-02-28 DIAGNOSIS — G629 Polyneuropathy, unspecified: Secondary | ICD-10-CM | POA: Diagnosis not present

## 2022-03-13 DIAGNOSIS — E1165 Type 2 diabetes mellitus with hyperglycemia: Secondary | ICD-10-CM | POA: Diagnosis not present

## 2022-03-19 DIAGNOSIS — M4316 Spondylolisthesis, lumbar region: Secondary | ICD-10-CM | POA: Diagnosis not present

## 2022-03-19 DIAGNOSIS — M5416 Radiculopathy, lumbar region: Secondary | ICD-10-CM | POA: Diagnosis not present

## 2022-04-04 DIAGNOSIS — R269 Unspecified abnormalities of gait and mobility: Secondary | ICD-10-CM | POA: Diagnosis not present

## 2022-04-04 DIAGNOSIS — D573 Sickle-cell trait: Secondary | ICD-10-CM | POA: Diagnosis not present

## 2022-04-04 DIAGNOSIS — E1142 Type 2 diabetes mellitus with diabetic polyneuropathy: Secondary | ICD-10-CM | POA: Diagnosis not present

## 2022-04-04 DIAGNOSIS — J45909 Unspecified asthma, uncomplicated: Secondary | ICD-10-CM | POA: Diagnosis not present

## 2022-04-04 DIAGNOSIS — I10 Essential (primary) hypertension: Secondary | ICD-10-CM | POA: Diagnosis not present

## 2022-04-04 DIAGNOSIS — Z0000 Encounter for general adult medical examination without abnormal findings: Secondary | ICD-10-CM | POA: Diagnosis not present

## 2022-04-04 DIAGNOSIS — G4733 Obstructive sleep apnea (adult) (pediatric): Secondary | ICD-10-CM | POA: Diagnosis not present

## 2022-04-04 DIAGNOSIS — E039 Hypothyroidism, unspecified: Secondary | ICD-10-CM | POA: Diagnosis not present

## 2022-04-04 DIAGNOSIS — M519 Unspecified thoracic, thoracolumbar and lumbosacral intervertebral disc disorder: Secondary | ICD-10-CM | POA: Diagnosis not present

## 2022-04-04 DIAGNOSIS — M1990 Unspecified osteoarthritis, unspecified site: Secondary | ICD-10-CM | POA: Diagnosis not present

## 2022-04-04 DIAGNOSIS — M810 Age-related osteoporosis without current pathological fracture: Secondary | ICD-10-CM | POA: Diagnosis not present

## 2022-04-04 DIAGNOSIS — K760 Fatty (change of) liver, not elsewhere classified: Secondary | ICD-10-CM | POA: Diagnosis not present

## 2022-04-05 DIAGNOSIS — M1712 Unilateral primary osteoarthritis, left knee: Secondary | ICD-10-CM | POA: Diagnosis not present

## 2022-04-12 DIAGNOSIS — E1165 Type 2 diabetes mellitus with hyperglycemia: Secondary | ICD-10-CM | POA: Diagnosis not present

## 2022-05-02 ENCOUNTER — Other Ambulatory Visit (HOSPITAL_COMMUNITY): Payer: Self-pay

## 2022-05-13 ENCOUNTER — Other Ambulatory Visit (HOSPITAL_COMMUNITY): Payer: Self-pay

## 2022-05-13 ENCOUNTER — Telehealth: Payer: Self-pay

## 2022-05-13 DIAGNOSIS — E1165 Type 2 diabetes mellitus with hyperglycemia: Secondary | ICD-10-CM | POA: Diagnosis not present

## 2022-05-13 NOTE — Telephone Encounter (Signed)
ERROR

## 2022-05-13 NOTE — Telephone Encounter (Signed)
PA has been DENIED.   Denial letter from insurance has been attached to patients documents.

## 2022-05-13 NOTE — Telephone Encounter (Signed)
Pharmacy Patient Advocate Encounter   Received notification from [facility] that prior authorization for Repatha  is required/requested.  Per Test Claim: APPROVED UNTIL 05/24  PA WAS SENT FOR RENEWAL FROM [facility]    PA RENEWAL submitted on 05/13/2022 to (ins)[employer] via CoverMyMeds Key [id number] Status is pending

## 2022-05-15 ENCOUNTER — Other Ambulatory Visit (HOSPITAL_COMMUNITY): Payer: Self-pay

## 2022-05-16 ENCOUNTER — Other Ambulatory Visit (HOSPITAL_COMMUNITY): Payer: Self-pay

## 2022-05-21 DIAGNOSIS — G4733 Obstructive sleep apnea (adult) (pediatric): Secondary | ICD-10-CM | POA: Diagnosis not present

## 2022-05-21 DIAGNOSIS — G4700 Insomnia, unspecified: Secondary | ICD-10-CM | POA: Diagnosis not present

## 2022-05-21 DIAGNOSIS — E663 Overweight: Secondary | ICD-10-CM | POA: Diagnosis not present

## 2022-05-24 DIAGNOSIS — R41841 Cognitive communication deficit: Secondary | ICD-10-CM | POA: Diagnosis not present

## 2022-05-24 DIAGNOSIS — R488 Other symbolic dysfunctions: Secondary | ICD-10-CM | POA: Diagnosis not present

## 2022-05-27 DIAGNOSIS — R41841 Cognitive communication deficit: Secondary | ICD-10-CM | POA: Diagnosis not present

## 2022-05-27 DIAGNOSIS — R488 Other symbolic dysfunctions: Secondary | ICD-10-CM | POA: Diagnosis not present

## 2022-05-28 DIAGNOSIS — Z1231 Encounter for screening mammogram for malignant neoplasm of breast: Secondary | ICD-10-CM | POA: Diagnosis not present

## 2022-05-31 ENCOUNTER — Encounter: Payer: Self-pay | Admitting: Endocrinology

## 2022-06-03 DIAGNOSIS — R41841 Cognitive communication deficit: Secondary | ICD-10-CM | POA: Diagnosis not present

## 2022-06-03 DIAGNOSIS — R488 Other symbolic dysfunctions: Secondary | ICD-10-CM | POA: Diagnosis not present

## 2022-06-05 DIAGNOSIS — R488 Other symbolic dysfunctions: Secondary | ICD-10-CM | POA: Diagnosis not present

## 2022-06-05 DIAGNOSIS — R41841 Cognitive communication deficit: Secondary | ICD-10-CM | POA: Diagnosis not present

## 2022-06-06 ENCOUNTER — Other Ambulatory Visit (INDEPENDENT_AMBULATORY_CARE_PROVIDER_SITE_OTHER): Payer: Medicare (Managed Care)

## 2022-06-06 DIAGNOSIS — Z794 Long term (current) use of insulin: Secondary | ICD-10-CM | POA: Diagnosis not present

## 2022-06-06 DIAGNOSIS — E782 Mixed hyperlipidemia: Secondary | ICD-10-CM | POA: Diagnosis not present

## 2022-06-06 DIAGNOSIS — E119 Type 2 diabetes mellitus without complications: Secondary | ICD-10-CM | POA: Diagnosis not present

## 2022-06-06 DIAGNOSIS — E063 Autoimmune thyroiditis: Secondary | ICD-10-CM | POA: Diagnosis not present

## 2022-06-06 LAB — COMPREHENSIVE METABOLIC PANEL
ALT: 9 U/L (ref 0–35)
AST: 13 U/L (ref 0–37)
Albumin: 4.2 g/dL (ref 3.5–5.2)
Alkaline Phosphatase: 47 U/L (ref 39–117)
BUN: 15 mg/dL (ref 6–23)
CO2: 28 mEq/L (ref 19–32)
Calcium: 9.8 mg/dL (ref 8.4–10.5)
Chloride: 103 mEq/L (ref 96–112)
Creatinine, Ser: 1.05 mg/dL (ref 0.40–1.20)
GFR: 49.5 mL/min — ABNORMAL LOW (ref >60.00)
Glucose, Bld: 98 mg/dL (ref 70–99)
Potassium: 3.6 mEq/L (ref 3.5–5.1)
Sodium: 142 mEq/L (ref 135–145)
Total Bilirubin: 0.6 mg/dL (ref 0.2–1.2)
Total Protein: 7 g/dL (ref 6.0–8.3)

## 2022-06-06 LAB — LIPID PANEL
Cholesterol: 180 mg/dL (ref 0–200)
HDL: 50.1 mg/dL (ref >39.00)
LDL Cholesterol: 112 mg/dL — ABNORMAL HIGH (ref 0–99)
NonHDL: 129.95
Total CHOL/HDL Ratio: 4
Triglycerides: 90 mg/dL (ref 0.0–149.0)
VLDL: 18 mg/dL (ref 0.0–40.0)

## 2022-06-06 LAB — MICROALBUMIN / CREATININE URINE RATIO
Creatinine,U: 58.3 mg/dL
Microalb Creat Ratio: 1.6 mg/g (ref 0.0–30.0)
Microalb, Ur: 0.9 mg/dL (ref 0.0–1.9)

## 2022-06-06 LAB — HEMOGLOBIN A1C: Hgb A1c MFr Bld: 6.2 % (ref 4.6–6.5)

## 2022-06-06 LAB — TSH: TSH: 2.65 u[IU]/mL (ref 0.35–5.50)

## 2022-06-07 DIAGNOSIS — R488 Other symbolic dysfunctions: Secondary | ICD-10-CM | POA: Diagnosis not present

## 2022-06-07 DIAGNOSIS — R41841 Cognitive communication deficit: Secondary | ICD-10-CM | POA: Diagnosis not present

## 2022-06-10 DIAGNOSIS — R488 Other symbolic dysfunctions: Secondary | ICD-10-CM | POA: Diagnosis not present

## 2022-06-10 DIAGNOSIS — R41841 Cognitive communication deficit: Secondary | ICD-10-CM | POA: Diagnosis not present

## 2022-06-12 DIAGNOSIS — E1165 Type 2 diabetes mellitus with hyperglycemia: Secondary | ICD-10-CM | POA: Diagnosis not present

## 2022-06-13 ENCOUNTER — Ambulatory Visit: Payer: Medicare (Managed Care) | Admitting: Endocrinology

## 2022-06-14 DIAGNOSIS — R488 Other symbolic dysfunctions: Secondary | ICD-10-CM | POA: Diagnosis not present

## 2022-06-14 DIAGNOSIS — R41841 Cognitive communication deficit: Secondary | ICD-10-CM | POA: Diagnosis not present

## 2022-06-17 DIAGNOSIS — R41841 Cognitive communication deficit: Secondary | ICD-10-CM | POA: Diagnosis not present

## 2022-06-17 DIAGNOSIS — R488 Other symbolic dysfunctions: Secondary | ICD-10-CM | POA: Diagnosis not present

## 2022-06-19 ENCOUNTER — Encounter: Payer: Self-pay | Admitting: Endocrinology

## 2022-06-20 DIAGNOSIS — G4700 Insomnia, unspecified: Secondary | ICD-10-CM | POA: Diagnosis not present

## 2022-06-20 DIAGNOSIS — G4710 Hypersomnia, unspecified: Secondary | ICD-10-CM | POA: Diagnosis not present

## 2022-06-20 DIAGNOSIS — E663 Overweight: Secondary | ICD-10-CM | POA: Diagnosis not present

## 2022-06-21 DIAGNOSIS — R41841 Cognitive communication deficit: Secondary | ICD-10-CM | POA: Diagnosis not present

## 2022-06-21 DIAGNOSIS — R488 Other symbolic dysfunctions: Secondary | ICD-10-CM | POA: Diagnosis not present

## 2022-06-22 ENCOUNTER — Other Ambulatory Visit: Payer: Self-pay | Admitting: Endocrinology

## 2022-06-22 DIAGNOSIS — E782 Mixed hyperlipidemia: Secondary | ICD-10-CM

## 2022-06-24 ENCOUNTER — Ambulatory Visit: Payer: Medicare (Managed Care) | Admitting: Endocrinology

## 2022-06-26 ENCOUNTER — Other Ambulatory Visit (HOSPITAL_COMMUNITY): Payer: Self-pay

## 2022-06-26 NOTE — Telephone Encounter (Signed)
PA for Repatha has been DENIED due to:  The information received from your physician does not support approval of this drug under your Medicare  Part D benefit because there was not confirmation the patient's skeletal-related muscle symptoms (for  example, myopathy or myalgia) occurred while receiving separate trials of both atorvastatin and  rosuvastatin (as single-entity or as combination products). Your plan's formulary has a restriction on this  drug. This restriction has been approved by the [employer] for Medicare and Medicaid Services (CMS).  Coverage of the requested medication is provided when the Food and Drug Administration (FDA) has  approved use of the requested medication for the diagnosis provided by your physician. The FDA  approved uses for this drug are 1) to reduce the risk of having a cardiac event (such as a heart attack) in  patients who have already been diagnosed with having heart disease (cardiovascular disease), 2) in  addition to a healthy diet and either taken alone or in combination with other medications (such as statins)  that also lower low-density lipoprotein cholesterol (LDL-C) levels (otherwise known as bad cholesterol) in  patients who have been diagnosed with primary hyperlipidemia (this includes a diagnosis called  heterozygous familial hypercholesterolemia [HeFH]) to further lower LDL-C levels, and 3) in addition to a  healthy diet and taken with other medications that lower LDL-C levels in patients 83 years and older who  have been diagnosed with homozygous familial hypercholesterolemia (HoFH) to further lower LDL-C  levels and 4) HeFH in pediatric patients 83 years and older as an adjunct to diet and other LDL-C  lowering therapies. Repatha is not covered if it will be taken in combination with Leqvio or Praluent. This  drug is not covered in patients who are less than 83 years of age unless the diagnosis is HoFH or HeFH,  in which case it is not covered in  patients less than 18 years of age. In order to be covered Repatha must  be prescribed by a specific type of physician who specializes in treatment of that condition. For the  diagnosis of HeFH, coverage is provided if another drug in a therapy class called 'statins' has been tried  first at specific doses, unless the patient is intolerant to statins. For patients with heart disease, coverage  is provided based on other medical conditions and/or history of medical procedures, and trial of a statin at  specific doses, unless intolerant. For the diagnosis of HoFH, coverage is provided based on the  evaluation of the patient's LDL-C levels before taking any medication to lower LDL-C and after treatment  with other drugs used to lower LDL-C, genetic testing to confirm a diagnosis of HoFH, and the presence  of medical conditions associated with having HoFH. Coverage is provided for the treatment of primary  hyperlipidemia (not associated with atherosclerotic cardiovascular disease (ASCVD), HeFH or HoFH)  based on the evaluation of the patient's LDL-C after treatment with statins at specific doses and a Zetia  trial, unless the patient is intolerant.  We did notate that patient was intolerant to statins due to muscle cramps.  Test claim show Praluent is also not covered.

## 2022-06-28 DIAGNOSIS — R41841 Cognitive communication deficit: Secondary | ICD-10-CM | POA: Diagnosis not present

## 2022-06-28 DIAGNOSIS — R488 Other symbolic dysfunctions: Secondary | ICD-10-CM | POA: Diagnosis not present

## 2022-07-04 ENCOUNTER — Other Ambulatory Visit (HOSPITAL_COMMUNITY): Payer: Self-pay

## 2022-07-04 NOTE — Telephone Encounter (Signed)
We can send a new request after 07/12/22. Looks like we accidentally answered one of the questions wrong, but b/c it's Medicare, we will either have to appeal (which could take up to a month to get an answer) or [employer] allows [place] to send a new request 60 days after the last one.

## 2022-07-12 DIAGNOSIS — E1165 Type 2 diabetes mellitus with hyperglycemia: Secondary | ICD-10-CM | POA: Diagnosis not present

## 2022-07-15 ENCOUNTER — Ambulatory Visit: Payer: Medicare (Managed Care) | Admitting: Endocrinology

## 2022-07-17 ENCOUNTER — Other Ambulatory Visit (HOSPITAL_COMMUNITY): Payer: Self-pay

## 2022-07-18 ENCOUNTER — Encounter: Payer: Self-pay | Admitting: Endocrinology

## 2022-07-22 DIAGNOSIS — M4316 Spondylolisthesis, lumbar region: Secondary | ICD-10-CM | POA: Diagnosis not present

## 2022-07-22 DIAGNOSIS — M542 Cervicalgia: Secondary | ICD-10-CM | POA: Diagnosis not present

## 2022-07-22 DIAGNOSIS — M5416 Radiculopathy, lumbar region: Secondary | ICD-10-CM | POA: Diagnosis not present

## 2022-07-22 DIAGNOSIS — Z79899 Other long term (current) drug therapy: Secondary | ICD-10-CM | POA: Diagnosis not present

## 2022-07-22 DIAGNOSIS — Z5181 Encounter for therapeutic drug level monitoring: Secondary | ICD-10-CM | POA: Diagnosis not present

## 2022-07-25 DIAGNOSIS — Z77122 Contact with and (suspected) exposure to noise: Secondary | ICD-10-CM | POA: Diagnosis not present

## 2022-07-25 DIAGNOSIS — E119 Type 2 diabetes mellitus without complications: Secondary | ICD-10-CM | POA: Diagnosis not present

## 2022-07-25 DIAGNOSIS — H903 Sensorineural hearing loss, bilateral: Secondary | ICD-10-CM | POA: Diagnosis not present

## 2022-08-06 ENCOUNTER — Ambulatory Visit: Payer: Medicare (Managed Care) | Admitting: Cardiology

## 2022-08-06 ENCOUNTER — Encounter: Payer: Self-pay | Admitting: Cardiology

## 2022-08-06 VITALS — BP 146/76 | HR 88 | Resp 16 | Ht 63.0 in | Wt 168.2 lb

## 2022-08-06 DIAGNOSIS — E1165 Type 2 diabetes mellitus with hyperglycemia: Secondary | ICD-10-CM | POA: Diagnosis not present

## 2022-08-06 DIAGNOSIS — Z794 Long term (current) use of insulin: Secondary | ICD-10-CM | POA: Diagnosis not present

## 2022-08-06 DIAGNOSIS — E7800 Pure hypercholesterolemia, unspecified: Secondary | ICD-10-CM

## 2022-08-06 DIAGNOSIS — Z87891 Personal history of nicotine dependence: Secondary | ICD-10-CM | POA: Diagnosis not present

## 2022-08-06 DIAGNOSIS — I10 Essential (primary) hypertension: Secondary | ICD-10-CM | POA: Diagnosis not present

## 2022-08-06 DIAGNOSIS — R0602 Shortness of breath: Secondary | ICD-10-CM | POA: Diagnosis not present

## 2022-08-06 DIAGNOSIS — Z789 Other specified health status: Secondary | ICD-10-CM | POA: Diagnosis not present

## 2022-08-06 NOTE — Progress Notes (Signed)
ID:  [name], DOB 07-Dec-1940, MRN [id number]  PCP:  Georgann Housekeeper, MD  Cardiologist:  Tessa Lerner, DO, [facility] (established care 06/18/2021) Former Cardiology Providers: Dr. Hayden Pedro   Date: 08/06/22 Last Office Visit: 07/21/2021   Chief Complaint  Patient presents with   Shortness of Breath   Hyperlipidemia   Hypertension   Follow-up    1 year    HPI  [name] is a 82 y.o. African-American female whose past medical history and cardiovascular risk factors include: Obstructive sleep apnea on CPAP, pure hypercholesterolemia, hypertension, obesity due to excess calories,  hypothyroidism, history of influenza A infection, sickle cell trait, insulin-dependent diabetes type 2, history of statin intolerance, former smoker.   Patient was referred to practice for abnormal EKG and dyspnea on exertion.  Given her multiple cardiovascular risk factors including insulin-dependent diabetes shared decision was to proceed with ischemic workup as outlined below.  She presents today for 1 year follow-up visit.  In the past she has known hypercholesterolemia with a prior LDL of 180 mg/dL.  Given her insulin-dependent diabetes recommended better lipid management.  However, she was intolerant to statin therapy.  She was started on Repatha and her lipids as of March 2023 noted LDL of 74 mg/dL.  Since last office visit, she is doing well from a cardiovascular standpoint.  She denies anginal chest pain or heart failure symptoms.  Her dyspnea on exertion is improving but still present.  She is ambulating 0.5 miles per day while using her cane.  She has lost 10 pounds with lifestyle changes and pharmacological therapy.  Echocardiogram was ordered at the last visit which still pending.  ALLERGIES: Allergies  Allergen Reactions   Cymbalta [Duloxetine Hcl] Diarrhea and Other (See Comments)    Dizziness, headache, irritability   Amlodipine Besylate Swelling   Baclofen     jerks     Betadine [Povidone Iodine] Swelling and Other (See Comments)    SWELLING REACTION DESCRIPTION/SEVERITY UNSPECIFIED  Reaction to betadine on eye lid and it swelled up   Erythromycin Base Other (See Comments)    unknown   Oxycodone Hcl    Percocet [Oxycodone-Acetaminophen] Other (See Comments)    Confusion change personality   Povidone-Iodine Swelling   Soap Other (See Comments)    unknown   Statins Other (See Comments)    Muscle cramps   Adhesive [Tape] Rash   Lipitor [Atorvastatin] Rash    MEDICATION LIST PRIOR TO VISIT: Current Meds  Medication Sig   acetaminophen (TYLENOL) 500 MG tablet Take 1,000 mg by mouth every 8 (eight) hours as needed for mild pain or moderate pain.    Blood Glucose Monitoring Suppl (ACCU-CHEK AVIVA PLUS) w/Device KIT Use Accu Chek Aviva as instructed to check blood sugar 4 times daily.   cetirizine (ZYRTEC) 10 MG tablet Take 1 tablet (10 mg total) by mouth daily as needed for allergies. (Patient taking differently: Take 10 mg by mouth 2 (two) times daily.)   Continuous Blood Gluc Sensor (DEXCOM G7 SENSOR) MISC 1 Device by Does not apply route as directed. Change sensor every 10 days   Continuous Blood Gluc Transmit (DEXCOM G6 TRANSMITTER) MISC Change every 3 months   FARXIGA 5 MG TABS tablet TAKE 1 TABLET DAILY   fluticasone (FLONASE) 50 MCG/ACT nasal spray Place 1 spray into both nostrils daily as needed for allergies. (Patient taking differently: Place 2 sprays into both nostrils daily.)   gabapentin (NEURONTIN) 600 MG tablet TAKE 1 TABLET(600 MG) BY MOUTH  THREE TIMES DAILY (Patient taking differently: Take 600 mg by mouth 3 (three) times daily.)   glucose blood (ACCU-CHEK AVIVA PLUS) test strip TEST BLOOD SUGAR FOUR TIMES DAILY AS DIRECTED   HYDROcodone-acetaminophen (NORCO/VICODIN) 5-325 MG tablet Take 1 tablet by mouth every 6 (six) hours as needed.   insulin aspart (NOVOLOG FLEXPEN) 100 UNIT/ML FlexPen INJECT 6 UNITS BEFORE BREAKFAST, 9 UNITS AT LUNCH,  8-12 UNITS AT DINNER, 3-4 UNITS AS NEEDED FOR LARGE CARBS OR ICE CREAM   insulin degludec (TRESIBA FLEXTOUCH) 100 UNIT/ML FlexTouch Pen Inject 24 Units into the skin daily.   Insulin Pen Needle (BD PEN NEEDLE NANO U/F) 32G X 4 MM MISC Inject insulin 4 times a day   irbesartan-hydrochlorothiazide (AVALIDE) 150-12.5 MG tablet Take 1 tablet by mouth daily.   ketoconazole (NIZORAL) 2 % cream Apply 1 application topically daily as needed (Itching).   levothyroxine (SYNTHROID) 50 MCG tablet TAKE 1 TABLET DAILY   Multiple Minerals-Vitamins (CAL-MAG-ZINC-D) TABS Take 1 tablet by mouth daily. 330 mg /130 mg/ 5 mg /5 mg   OZEMPIC, 0.25 OR 0.5 MG/DOSE, 2 MG/3ML SOPN INJECT 0.5 MG ONCE WEEKLY   pantoprazole (PROTONIX) 40 MG tablet Take 1 tablet by mouth as needed.   REPATHA PUSHTRONEX SYSTEM 420 MG/3.5ML SOCT INJECT 420 MG SUBCUTANEOUSLY EVERY 30 DAYS   SYMBICORT 80-4.5 MCG/ACT inhaler Inhale 2 puffs into the lungs 2 (two) times daily as needed (shortness of breath). (Patient taking differently: Inhale 2 puffs into the lungs 2 (two) times daily.)     PAST MEDICAL HISTORY: Past Medical History:  Diagnosis Date   Acute blood loss anemia    Acute encephalopathy 07/15/2016   AKI (acute kidney injury)    Anemia    has sickle cell trait   Anxiety    Arthritis    Asthma    has used inhaler in past for asthmatic bronchitis, last time- early 2012   Bilateral primary osteoarthritis of knee 03/26/2016   Complication of anesthesia    wakes up shaking   Diabetes mellitus    Diabetes mellitus with neuropathy 10/29/2012   Diverticulitis    Dyslipidemia    Dyspnea    with exertion   Encephalopathy 06/2016   due to medications after surgery   Fibromyalgia    GERD (gastroesophageal reflux disease)    occas. use of  Prilosec   Heart murmur    sees Dr. Shana Chute, last seen- early 2012   Herniated nucleus pulposus, L2-3 06/25/2016   History of back surgery    Hypertension    02/2010- stress test /w PCP    Hypothyroidism    Loose bowel movements 12/2016   Lumbar radiculopathy 06/27/2016   Lumbar stenosis with neurogenic claudication 03/17/2017   Memory disorder 02/16/2016   Myoclonic jerking    Neuromuscular disorder    lumbar radiculopathy, lumbago   Nocturnal leg cramps 09/27/2014   Osteoporosis 03/10/2014   Pneumonia    Sickle cell trait    Sleep apnea    borderline sleep apnea, states she no longer uses, early 2012- stopped using    Spinal stenosis of lumbar region with radiculopathy 02/28/2014   Spondylolisthesis of lumbar region 03/19/2011   Type II or unspecified type diabetes mellitus without mention of complication, uncontrolled 07/08/2013    PAST SURGICAL HISTORY: Past Surgical History:  Procedure Laterality Date   ABDOMINAL HYSTERECTOMY     adb.cyst     ovarian cyst   BACK SURGERY     2012, 2015 (3 total)   BACK  SURGERY  2018   02/24/2017   COLONOSCOPY     EYE SURGERY     macular degeneration treatment - injections   FEMUR IM NAIL Left 06/13/2018   Procedure: INTRAMEDULLARY (IM) NAIL FEMORAL;  Surgeon: Kathryne Hitch, MD;  Location: [facility];  Service: Orthopedics;  Laterality: Left;   OVARIAN CYST SURGERY     POSTERIOR LUMBAR FUSION 4 LEVEL N/A 03/17/2017   Procedure: Decompression of Lumbar One-Two with Thoracic Ten to Lumbar Two Fusion;  Surgeon: Barnett Abu, MD;  Location: [facility] OR;  Service: Neurosurgery;  Laterality: N/A;  Decompression of L1-2 with T10 to L2 Fusion   TOTAL KNEE ARTHROPLASTY Right 08/13/2019   Procedure: RIGHT TOTAL KNEE ARTHROPLASTY;  Surgeon: Kathryne Hitch, MD;  Location: [facility];  Service: Orthopedics;  Laterality: Right;   TOTAL KNEE REVISION Right 07/05/2020   Procedure: TOTAL KNEE REVISION;  Surgeon: Ollen Gross, MD;  Location: [facility];  Service: Orthopedics;  Laterality: Right;     FAMILY HISTORY: The patient family history includes Breast cancer in her paternal aunt; Cancer - Prostate in her father; Multiple myeloma in  her paternal aunt; Ovarian cancer in her mother.  SOCIAL HISTORY:  The patient  reports that she quit smoking about 19 years ago. Her smoking use included cigarettes. She has a 3.00 pack-year smoking history. She has never used smokeless tobacco. She reports current alcohol use. She reports that she does not use drugs.  REVIEW OF SYSTEMS: Review of Systems  Constitutional: Positive for weight loss.  Cardiovascular:  Positive for dyspnea on exertion (improving). Negative for chest pain, leg swelling, near-syncope, orthopnea, palpitations, paroxysmal nocturnal dyspnea and syncope.  Respiratory:  Positive for shortness of breath (improving).     PHYSICAL EXAM:    08/06/2022   11:08 AM 02/05/2022   11:13 AM 10/25/2021   11:15 AM  Vitals with BMI  Height 5\' 3"  5\' 3"    Weight 168 lbs 3 oz 171 lbs 13 oz   BMI 29.8 30.44   Systolic 146 136 161  Diastolic 76 80 70  Pulse 88 67    Physical Exam  Constitutional: No distress.  Age appropriate, hemodynamically stable. Walks with a walker  Neck: No JVD present.  Cardiovascular: Normal rate, regular rhythm, S1 normal, S2 normal, intact distal pulses and normal pulses. Exam reveals no gallop, no S3 and no S4.  No murmur heard. Pulses:      Dorsalis pedis pulses are 2+ on the right side and 2+ on the left side.       Posterior tibial pulses are 2+ on the right side and 2+ on the left side.  Pulmonary/Chest: Effort normal and breath sounds normal. No stridor. She has no wheezes. She has no rales.  Abdominal: Soft. Bowel sounds are normal. She exhibits no distension. There is no abdominal tenderness.  Musculoskeletal:        General: No edema.     Cervical back: Neck supple.  Neurological: She is alert and oriented to person, place, and time. She has intact cranial nerves (2-12).  Skin: Skin is warm and moist.   CARDIAC DATABASE: EKG: 06/18/2021: NSR, 71 bpm, without underlying ischemia or injury pattern.  08/06/2022: Sinus rhythm, 77 bpm, low  voltage precordial leads, consider anteroseptal infarct, without underlying injury pattern.  Echocardiogram: 10/14/2014: LVEF 60-65%, Grade I diastolic dysfunction, trivial MR, mild TR.    Stress Testing: Lexiscan  Nuclear stress test 07/09/2021:  Nondiagnostic ECG stress. The heart rate response was consistent with  Lexiscan.  Myocardial perfusion is normal. Overall LV systolic function is normal without regional wall motion abnormalities. Stress LV EF: 79%.  No previous exam available for comparison. Low risk.   Heart Catheterization: None  LABORATORY DATA:    Latest Ref Rng & Units 05/02/2021   12:10 PM 07/07/2020    3:34 AM 07/06/2020    3:10 AM  CBC  WBC 4.0 - 10.5 K/uL 8.6  8.3  9.6   Hemoglobin 12.0 - 15.0 g/dL 16.1  09.6  04.5   Hematocrit 36.0 - 46.0 % 41.7  32.7  35.9   Platelets 150 - 400 K/uL 223  154  179        Latest Ref Rng & Units 06/06/2022   11:05 AM 01/29/2022   11:24 AM 10/18/2021   10:30 AM  CMP  Glucose 70 - 99 mg/dL 98  409  81   BUN 6 - 23 mg/dL Creatinine 0.40 - 1.20 mg/dL 8.11  9.14  7.82   Sodium 135 - 145 mEq/L 142  141  141   Potassium 3.5 - 5.1 mEq/L 3.6  3.6  3.5   Chloride 96 - 112 mEq/L 103  103  104   CO2 19 - 32 mEq/L Calcium 8.4 - 10.5 mg/dL 9.8  9.9  9.5   Total Protein 6.0 - 8.3 g/dL 7.0     Total Bilirubin 0.2 - 1.2 mg/dL 0.6     Alkaline Phos 39 - 117 U/L 47     AST 0 - 37 U/L 13     ALT 0 - 35 U/L 9       Lipid Panel  Lab Results  Component Value Date   CHOL 180 06/06/2022   HDL 50.10 06/06/2022   LDLCALC 112 (H) 06/06/2022   LDLDIRECT 166.0 02/09/2019   TRIG 90.0 06/06/2022   CHOLHDL 4 06/06/2022     No components found for: "NTPROBNP" No results for input(s): "PROBNP" in the last 8760 hours. Recent Labs    10/18/21 1030 06/06/22 1105  TSH 2.97 2.65    BMP Recent Labs    10/18/21 1030 01/29/22 1124 06/06/22 1105  NA 141 141 142  K 3.5 3.6 3.6  CL 104 103 103  CO2 GLUCOSE 81 131* 98  BUN CREATININE 1.11 1.03 1.05  CALCIUM 9.5 9.9 9.8    HEMOGLOBIN A1C Lab Results  Component Value Date   HGBA1C 6.2 06/06/2022   MPG 151.33 06/22/2020   External Labs: Collected: 03/08/2021 provided by referring physician. Hemoglobin 14 g/dL, hematocrit 95.6%. BUN 21, creatinine 1.12 mg/dL. Sodium 144, potassium 3.9, chloride 104, bicarb 32. AST 11, ALT 8, alkaline phosphatase 68  IMPRESSION:    ICD-10-CM   1. Shortness of breath  R06.02 EKG 12-Lead    PCV ECHOCARDIOGRAM COMPLETE    2. Type 2 diabetes mellitus with hyperglycemia, with long-term current use of insulin  E11.65    Z79.4     3. Benign hypertension  I10     4. Pure hypercholesterolemia  E78.00     5. Statin intolerance  Z78.9     6. Former smoker  Z87.891         RECOMMENDATIONS: MONCHEL POLLITT is a 82 y.o. African-American female whose past medical history and cardiac risk factors include: Obstructive sleep apnea on CPAP, pure hypercholesterolemia, hypertension, obesity due to excess calories,  hypothyroidism, history  of influenza A infection, sickle cell trait, insulin-dependent diabetes type 2, history of statin intolerance, former smoker.   Shortness of breath Chronic and stable EKG: Nonischemic. Echocardiogram: Still pending, new order placed. Prior stress test: Low risk study. Home blood pressures are better controlled compared to the office numbers. Medications reconciled. Monitor for now.  Benign hypertension Office blood pressures are not at goal Home blood pressures are at goal Encouraged her to keep a log of blood pressures and to review it with either myself or the managing provider. Medication reconciled.  Reemphasized the importance of low salt diet (discussed the benefits of DASH and Plant based diet).  Pure hypercholesterolemia Statin intolerance Currently on Repatha. For reasons unknown she was she ran out of her Repatha for 2 or 3 months.  It  is likely the reason why her LDL when checked in February was elevated compared to her prior levels. Goal LDL <70 mg/dL given her diagnosis of diabetes She is planning to have repeat lipids later this week on Thursday when she sees endocrinology Of note, her LDL back in April 2018 was 180 mg/dL.  Type 2 diabetes mellitus with hyperglycemia, with long-term current use of insulin Reemphasized importance of glycemic control. Currently on Ozempic, Repatha, Farxiga, ARB  FINAL MEDICATION LIST END OF ENCOUNTER: No orders of the defined types were placed in this encounter.   Medications Discontinued During This Encounter  Medication Reason   BYSTOLIC 5 MG tablet    famotidine (PEPCID) 20 MG tablet      Current Outpatient Medications:    acetaminophen (TYLENOL) 500 MG tablet, Take 1,000 mg by mouth every 8 (eight) hours as needed for mild pain or moderate pain. , Disp: , Rfl:    Blood Glucose Monitoring Suppl (ACCU-CHEK AVIVA PLUS) w/Device KIT, Use Accu Chek Aviva as instructed to check blood sugar 4 times daily., Disp: 1 kit, Rfl: 0   cetirizine (ZYRTEC) 10 MG tablet, Take 1 tablet (10 mg total) by mouth daily as needed for allergies. (Patient taking differently: Take 10 mg by mouth 2 (two) times daily.), Disp: 30 tablet, Rfl: 0   Continuous Blood Gluc Sensor (DEXCOM G7 SENSOR) MISC, 1 Device by Does not apply route as directed. Change sensor every 10 days, Disp: 3 each, Rfl: 3   Continuous Blood Gluc Transmit (DEXCOM G6 TRANSMITTER) MISC, Change every 3 months, Disp: 1 each, Rfl: 2   FARXIGA 5 MG TABS tablet, TAKE 1 TABLET DAILY, Disp: 90 tablet, Rfl: 3   fluticasone (FLONASE) 50 MCG/ACT nasal spray, Place 1 spray into both nostrils daily as needed for allergies. (Patient taking differently: Place 2 sprays into both nostrils daily.), Disp: 0.003 g, Rfl: 0   gabapentin (NEURONTIN) 600 MG tablet, TAKE 1 TABLET(600 MG) BY MOUTH THREE TIMES DAILY (Patient taking differently: Take 600 mg by mouth 3  (three) times daily.), Disp: 270 tablet, Rfl: 1   glucose blood (ACCU-CHEK AVIVA PLUS) test strip, TEST BLOOD SUGAR FOUR TIMES DAILY AS DIRECTED, Disp: 400 strip, Rfl: 3   HYDROcodone-acetaminophen (NORCO/VICODIN) 5-325 MG tablet, Take 1 tablet by mouth every 6 (six) hours as needed., Disp: , Rfl:    insulin aspart (NOVOLOG FLEXPEN) 100 UNIT/ML FlexPen, INJECT 6 UNITS BEFORE BREAKFAST, 9 UNITS AT LUNCH, 8-12 UNITS AT DINNER, 3-4 UNITS AS NEEDED FOR LARGE CARBS OR ICE CREAM, Disp: 30 mL, Rfl: 2   insulin degludec (TRESIBA FLEXTOUCH) 100 UNIT/ML FlexTouch Pen, Inject 24 Units into the skin daily., Disp: 24 mL, Rfl: 3   Insulin Pen  Needle (BD PEN NEEDLE NANO U/F) 32G X 4 MM MISC, Inject insulin 4 times a day, Disp: 400 each, Rfl: 2   irbesartan-hydrochlorothiazide (AVALIDE) 150-12.5 MG tablet, Take 1 tablet by mouth daily., Disp: 90 tablet, Rfl: 2   ketoconazole (NIZORAL) 2 % cream, Apply 1 application topically daily as needed (Itching)., Disp: , Rfl:    levothyroxine (SYNTHROID) 50 MCG tablet, TAKE 1 TABLET DAILY, Disp: 90 tablet, Rfl: 3   Multiple Minerals-Vitamins (CAL-MAG-ZINC-D) TABS, Take 1 tablet by mouth daily. 330 mg /130 mg/ 5 mg /5 mg, Disp: , Rfl:    OZEMPIC, 0.25 OR 0.5 MG/DOSE, 2 MG/3ML SOPN, INJECT 0.5 MG ONCE WEEKLY, Disp: 9 mL, Rfl: 3   pantoprazole (PROTONIX) 40 MG tablet, Take 1 tablet by mouth as needed., Disp: , Rfl:    REPATHA PUSHTRONEX SYSTEM 420 MG/3.5ML SOCT, INJECT 420 MG SUBCUTANEOUSLY EVERY 30 DAYS, Disp: 10.5 mL, Rfl: 2   SYMBICORT 80-4.5 MCG/ACT inhaler, Inhale 2 puffs into the lungs 2 (two) times daily as needed (shortness of breath). (Patient taking differently: Inhale 2 puffs into the lungs 2 (two) times daily.), Disp: 1 Inhaler, Rfl: 0  Orders Placed This Encounter  Procedures   EKG 12-Lead   PCV ECHOCARDIOGRAM COMPLETE     There are no Patient Instructions on file for this visit.   --Continue cardiac medications as reconciled in final medication  list. --Return in about 1 year (around 08/06/2023) for Annual follow up visit, Dyspnea. Or sooner if needed. --Continue follow-up with your primary care physician regarding the management of your other chronic comorbid conditions.  Patient's questions and concerns were addressed to her satisfaction. She voices understanding of the instructions provided during this encounter.   This note was created using a voice recognition software as a result there may be grammatical errors inadvertently enclosed that do not reflect the nature of this encounter. Every attempt is made to correct such errors.  Tessa Lerner, [place], [facility]  Pager:  [phone] Office: [phone]

## 2022-08-08 ENCOUNTER — Encounter: Payer: Self-pay | Admitting: Endocrinology

## 2022-08-08 ENCOUNTER — Ambulatory Visit (INDEPENDENT_AMBULATORY_CARE_PROVIDER_SITE_OTHER): Payer: Medicare (Managed Care) | Admitting: Endocrinology

## 2022-08-08 VITALS — BP 122/74 | HR 79 | Ht 63.0 in | Wt 167.2 lb

## 2022-08-08 DIAGNOSIS — I10 Essential (primary) hypertension: Secondary | ICD-10-CM

## 2022-08-08 DIAGNOSIS — E782 Mixed hyperlipidemia: Secondary | ICD-10-CM | POA: Diagnosis not present

## 2022-08-08 DIAGNOSIS — E119 Type 2 diabetes mellitus without complications: Secondary | ICD-10-CM

## 2022-08-08 DIAGNOSIS — Z794 Long term (current) use of insulin: Secondary | ICD-10-CM

## 2022-08-08 DIAGNOSIS — E063 Autoimmune thyroiditis: Secondary | ICD-10-CM | POA: Diagnosis not present

## 2022-08-08 LAB — POCT GLYCOSYLATED HEMOGLOBIN (HGB A1C): Hemoglobin A1C: 5.9 % — AB (ref 4.0–5.6)

## 2022-08-08 NOTE — Patient Instructions (Addendum)
Supper dose 5-6 units  Snack at bedtime  Tresiba 7 units

## 2022-08-08 NOTE — Progress Notes (Signed)
Patient ID: [name], female   DOB: 07-25-1940, 82 y.o.   MRN: [id number]      Reason for Appointment: Endocrinology follow-up  History of Present Illness    PROBLEM 1: Type 2 diabetes mellitus, date of diagnosis: 1998.   Prior history: She had previously been treated with Byetta, Glumetza, Amaryl, Victoza and  Onglyza However because of inadequate control and intolerance to drugs she was finally given pre-meal insulin along with Amaryl, Glumetza eventually stopped because of side effects and also Lantus added  Recent history:  The insulin regimen is: Novolog 0-6 at breakfast and 10 lunch, 6-7 acs Tresiba 8 units daily  Oral agents: Farxiga 5 mg daily, Ozempic 0.5 mg weekly  Her A1c is 5.9 compared to 6.3  A1c previous range 5.9--7.4  Current blood sugar patterns and problems with management:  Her last visit was in 01/2022 and she did not make the office visit after her labs in February  As before with Ozempic and CGM she has had better blood sugar control and reduce insulin dosage  She has been concerned about getting low sugars during the night mostly around 12-1 AM Her Tresiba dose has been reduced by about 4 units since her last visit Also taking somewhat less insulin at dinnertime She has not had a sensor in the last week and only sporadically using the Accu-Chek now Although she is tries to have a bedtime snack sometimes of peanut butter crackers sporadically has had low sugars between 3-6 AM on the sensor Also on her Accu-Chek she has had a couple of readings in the 70s and 80s around 1 AM with some symptoms of low sugar However most of her blood sugars on waking up are not normal including recently readings of 97 and 125 FASTING on her Accu-Chek Her weight is down 4 pounds With her sensor has only sporadic hyperglycemia either rebounding from low sugars overnight and rarely after lunch  Side effects from diabetes medications: Victoza caused nausea.   Metformin  Download analysis of her Dexcom G6 sensor between 3/30 and 08/02/2022 as follows   She has some variability in her blood sugars with inconsistent patterns during the night and first part of the day Overnight blood sugars are quite variable especially between 1 AM and 7 AM and a couple of episodes of hypoglycemia Mild 3 AM on 5-6 AM  Premeal blood sugars are relatively good at lunch and somewhat lower before dinner She has fairly tight control of postprandial readings in the second week after meals with only sporadic high readings after lunch or late afternoon Blood sugars after dinner are similar to Premeal readings and mostly averaging around 130-140  Hypoglycemia as above  AVERAGE on the sensor = 139 Time in range = 85% Blood sugars below 70 = 1%  Previous data:  CGM use % of time 76  2-week average/GV 133/23  Time in range 93       %  % Time Above 180 7  % Time above 250   % Time Below 70 <1   Previous Accu-Chek readings:   PRE-MEAL Fasting Lunch Dinner Bedtime Overall  Glucose range: 57-209      Mean/median:     132   POST-MEAL PC Breakfast PC Lunch PC Dinner  Glucose range:   ?  Mean/median:       Certified Diabetes Educator visit: Most recent: 7/13.  Dietician visit: Most recent: 3/13.   Wt Readings from Last 3 Encounters:  08/08/22  167 lb 3.2 oz (75.8 kg)  08/06/22 168 lb 3.2 oz (76.3 kg)  02/05/22 171 lb 12.8 oz (77.9 kg)   Complications: Neuropathy  LABS:  Lab Results  Component Value Date   HGBA1C 5.9 (A) 08/08/2022   HGBA1C 6.2 06/06/2022   HGBA1C 6.3 01/29/2022   Lab Results  Component Value Date   MICROALBUR 0.9 06/06/2022   LDLCALC 112 (H) 06/06/2022   CREATININE 1.05 06/06/2022   Lab Results  Component Value Date   FRUCTOSAMINE 283 06/09/2018   FRUCTOSAMINE 296 (H) 05/16/2017   FRUCTOSAMINE 322 (H) 10/10/2016    Multiple other issues are addressed in review of systems  Office Visit on 08/08/2022  Component Date Value Ref  Range Status   Hemoglobin A1C 08/08/2022 5.9 (A)  4.0 - 5.6 % Final      Allergies as of 08/08/2022       Reactions   Cymbalta [duloxetine Hcl] Diarrhea, Other (See Comments)   Dizziness, headache, irritability   Amlodipine Besylate Swelling   Baclofen    jerks    Betadine [povidone Iodine] Swelling, Other (See Comments)   SWELLING REACTION DESCRIPTION/SEVERITY UNSPECIFIED  Reaction to betadine on eye lid and it swelled up   Erythromycin Base Other (See Comments)   unknown   Oxycodone Hcl    Percocet [oxycodone-acetaminophen] Other (See Comments)   Confusion change personality   Povidone-iodine Swelling   Soap Other (See Comments)   unknown   Statins Other (See Comments)   Muscle cramps   Adhesive [tape] Rash   Lipitor [atorvastatin] Rash        Medication List        Accurate as of August 08, 2022 12:16 PM. If you have any questions, ask your nurse or doctor.          STOP taking these medications    Dexcom G6 Transmitter Misc Stopped by: Reather Littler, MD       TAKE these medications    Accu-Chek Aviva Plus test strip Generic drug: glucose blood TEST BLOOD SUGAR FOUR TIMES DAILY AS DIRECTED   Accu-Chek Aviva Plus w/Device Kit Use Accu Chek Aviva as instructed to check blood sugar 4 times daily.   acetaminophen 500 MG tablet Commonly known as: TYLENOL Take 1,000 mg by mouth every 8 (eight) hours as needed for mild pain or moderate pain.   BD Pen Needle Nano U/F 32G X 4 MM Misc Generic drug: Insulin Pen Needle Inject insulin 4 times a day   Cal-Mag-Zinc-D Tabs Take 1 tablet by mouth daily. 330 mg /130 mg/ 5 mg /5 mg   cetirizine 10 MG tablet Commonly known as: ZYRTEC Take 1 tablet (10 mg total) by mouth daily as needed for allergies. What changed: when to take this   Dexcom G7 Sensor Misc 1 Device by Does not apply route as directed. Change sensor every 10 days   Farxiga 5 MG Tabs tablet Generic drug: dapagliflozin propanediol TAKE 1 TABLET  DAILY   fluticasone 50 MCG/ACT nasal spray Commonly known as: FLONASE Place 1 spray into both nostrils daily as needed for allergies. What changed:  how much to take when to take this   gabapentin 600 MG tablet Commonly known as: NEURONTIN TAKE 1 TABLET(600 MG) BY MOUTH THREE TIMES DAILY What changed: See the new instructions.   HYDROcodone-acetaminophen 5-325 MG tablet Commonly known as: NORCO/VICODIN Take 1 tablet by mouth every 6 (six) hours as needed.   irbesartan-hydrochlorothiazide 150-12.5 MG tablet Commonly known as: AVALIDE Take 1 tablet  by mouth daily.   ketoconazole 2 % cream Commonly known as: NIZORAL Apply 1 application topically daily as needed (Itching).   levothyroxine 50 MCG tablet Commonly known as: SYNTHROID TAKE 1 TABLET DAILY   NovoLOG FlexPen 100 UNIT/ML FlexPen Generic drug: insulin aspart INJECT 6 UNITS BEFORE BREAKFAST, 9 UNITS AT LUNCH, 8-12 UNITS AT DINNER, 3-4 UNITS AS NEEDED FOR LARGE CARBS OR ICE CREAM   Ozempic (0.25 or 0.5 MG/DOSE) 2 MG/3ML Sopn Generic drug: Semaglutide(0.25 or 0.5MG /DOS) INJECT 0.5 MG ONCE WEEKLY   pantoprazole 40 MG tablet Commonly known as: PROTONIX Take 1 tablet by mouth as needed.   Repatha Pushtronex System 420 MG/3.5ML Soct Generic drug: Evolocumab with Infusor INJECT 420 MG SUBCUTANEOUSLY EVERY 30 DAYS   Symbicort 80-4.5 MCG/ACT inhaler Generic drug: budesonide-formoterol Inhale 2 puffs into the lungs 2 (two) times daily as needed (shortness of breath). What changed: when to take this   [place] FlexTouch 100 UNIT/ML FlexTouch Pen Generic drug: insulin degludec Inject 24 Units into the skin daily.        Allergies:  Allergies  Allergen Reactions   Cymbalta [Duloxetine Hcl] Diarrhea and Other (See Comments)    Dizziness, headache, irritability   Amlodipine Besylate Swelling   Baclofen     jerks    Betadine [Povidone Iodine] Swelling and Other (See Comments)    SWELLING REACTION  DESCRIPTION/SEVERITY UNSPECIFIED  Reaction to betadine on eye lid and it swelled up   Erythromycin Base Other (See Comments)    unknown   Oxycodone Hcl    Percocet [Oxycodone-Acetaminophen] Other (See Comments)    Confusion change personality   Povidone-Iodine Swelling   Soap Other (See Comments)    unknown   Statins Other (See Comments)    Muscle cramps   Adhesive [Tape] Rash   Lipitor [Atorvastatin] Rash    Past Medical History:  Diagnosis Date   Acute blood loss anemia    Acute encephalopathy 07/15/2016   AKI (acute kidney injury)    Anemia    has sickle cell trait   Anxiety    Arthritis    Asthma    has used inhaler in past for asthmatic bronchitis, last time- early 2012   Bilateral primary osteoarthritis of knee 03/26/2016   Complication of anesthesia    wakes up shaking   Diabetes mellitus    Diabetes mellitus with neuropathy 10/29/2012   Diverticulitis    Dyslipidemia    Dyspnea    with exertion   Encephalopathy 06/2016   due to medications after surgery   Fibromyalgia    GERD (gastroesophageal reflux disease)    occas. use of  Prilosec   Heart murmur    sees Dr. Shana Chute, last seen- early 2012   Herniated nucleus pulposus, L2-3 06/25/2016   History of back surgery    Hypertension    02/2010- stress test /w PCP   Hypothyroidism    Loose bowel movements 12/2016   Lumbar radiculopathy 06/27/2016   Lumbar stenosis with neurogenic claudication 03/17/2017   Memory disorder 02/16/2016   Myoclonic jerking    Neuromuscular disorder    lumbar radiculopathy, lumbago   Nocturnal leg cramps 09/27/2014   Osteoporosis 03/10/2014   Pneumonia    Sickle cell trait    Sleep apnea    borderline sleep apnea, states she no longer uses, early 2012- stopped using    Spinal stenosis of lumbar region with radiculopathy 02/28/2014   Spondylolisthesis of lumbar region 03/19/2011   Type II or unspecified type diabetes mellitus without  mention of complication, uncontrolled 07/08/2013     Past Surgical History:  Procedure Laterality Date   ABDOMINAL HYSTERECTOMY     adb.cyst     ovarian cyst   BACK SURGERY     2012, 2015 (3 total)   BACK SURGERY  2018   02/24/2017   COLONOSCOPY     EYE SURGERY     macular degeneration treatment - injections   FEMUR IM NAIL Left 06/13/2018   Procedure: INTRAMEDULLARY (IM) NAIL FEMORAL;  Surgeon: Kathryne Hitch, MD;  Location: [facility];  Service: Orthopedics;  Laterality: Left;   OVARIAN CYST SURGERY     POSTERIOR LUMBAR FUSION 4 LEVEL N/A 03/17/2017   Procedure: Decompression of Lumbar One-Two with Thoracic Ten to Lumbar Two Fusion;  Surgeon: Barnett Abu, MD;  Location: [facility] OR;  Service: Neurosurgery;  Laterality: N/A;  Decompression of L1-2 with T10 to L2 Fusion   TOTAL KNEE ARTHROPLASTY Right 08/13/2019   Procedure: RIGHT TOTAL KNEE ARTHROPLASTY;  Surgeon: Kathryne Hitch, MD;  Location: [facility];  Service: Orthopedics;  Laterality: Right;   TOTAL KNEE REVISION Right 07/05/2020   Procedure: TOTAL KNEE REVISION;  Surgeon: Ollen Gross, MD;  Location: [facility];  Service: Orthopedics;  Laterality: Right;     Family History  Problem Relation Age of Onset   Ovarian cancer Mother    Cancer - Prostate Father    Breast cancer Paternal Aunt    Multiple myeloma Paternal Aunt    Anesthesia problems Neg Hx    Hypotension Neg Hx    Malignant hyperthermia Neg Hx    Pseudochol deficiency Neg Hx     Social History:  reports that she quit smoking about 19 years ago. Her smoking use included cigarettes. She has a 3.00 pack-year smoking history. She has never used smokeless tobacco. She reports current alcohol use. She reports that she does not use drugs.  ROS    NEUROPATHY: She has had tingling in feet and legs, mostly at night. She is taking gabapentin She has not tried duloxetine as suggested  Hyperlipidemia:   The lipid abnormality consists of elevated LDL persistently Did not tolerate Crestor or lovastatin  because of muscle cramps  She stopped Zetia presumably because she was having muscle cramps  She was started on Repatha in 2021  Previously when taking this regularly she had good results but her labs in February indicated significantly higher LDL compared to previous level of 74 from not getting the injection approved by her insurance She has just started taking this back  Previously was having some pain with the injection device and is using the monthly pump device She is using the device on her leg as she feels less discomfort there  LDL results as follows:  Lab Results  Component Value Date   CHOL 180 06/06/2022   CHOL 140 07/12/2021   CHOL 240 (H) 04/10/2021   Lab Results  Component Value Date   HDL 50.10 06/06/2022   HDL 48.10 07/12/2021   HDL 48.90 04/10/2021   Lab Results  Component Value Date   LDLCALC 112 (H) 06/06/2022   LDLCALC 74 07/12/2021   LDLCALC 162 (H) 04/10/2021   Lab Results  Component Value Date   TRIG 90.0 06/06/2022   TRIG 90.0 07/12/2021   TRIG 150.0 (H) 04/10/2021   Lab Results  Component Value Date   CHOLHDL 4 06/06/2022   CHOLHDL 3 07/12/2021   CHOLHDL 5 04/10/2021   Lab Results  Component Value Date  LDLDIRECT 166.0 02/09/2019   LDLDIRECT 115.0 12/18/2016   LDLDIRECT 134.0 10/10/2016      HYPERTENSION:  Has been present for several years.   She is on Avalide 150/12.5 mg,  Also on Bystolic 5 mg from PCP    BP Readings from Last 3 Encounters:  08/08/22 122/74  08/06/22 (!) 146/76  02/05/22 136/80   . Has a history of leg and muscle cramps and takes magnesium supplements twice a day  No recent hypokalemia or rise in creatinine Urine microalbumin normal  Lab Results  Component Value Date   CREATININE 1.05 06/06/2022   CREATININE 1.03 01/29/2022   CREATININE 1.11 10/18/2021     Lab Results  Component Value Date   K 3.6 06/06/2022      Hypothyroidism  She had a relatively high TSH as of 4/17 and was empirically  given Synthroid 25 g Subsequently has been on 50 mcg which she takes regularly No unusual fatigue or weight change  TSH history as follows   Lab Results  Component Value Date   TSH 2.65 06/06/2022   TSH 2.97 10/18/2021   TSH 2.58 07/12/2021   FREET4 1.06 09/14/2019   FREET4 0.88 02/09/2019   FREET4 0.97 10/14/2018         Examination:   BP 122/74 (BP Location: Left Arm, Patient Position: Sitting, Cuff Size: Normal)   Pulse 79   Ht 5\' 3"  (1.6 m)   Wt 167 lb 3.2 oz (75.8 kg)   LMP  (LMP Unknown)   SpO2 95%   BMI 29.62 kg/m   Body mass index is 29.62 kg/m.     Assesment/Plan:   1. DIABETES type 2 on insulin  See history of present illness for detailed discussion of management, blood sugar patterns and problems identified  She is on basal bolus insulin, Ozempic 0.5 and Farxiga 5 mg daily  A1c is better at 5.9  Currently requiring relatively low-dose of insulin Also has some tendency to low sugars overnight even though blood sugars before breakfast and other meals are not low This is despite having relatively good readings at bedtime However unable to verify her blood sugar data over the last week because of lack of having a Dexcom sensor on  Instructions today: Reduce Tresiba to 7 units Reduce NovoLog to only 5 or 6 units at dinnertime and extra 2 units if wanting dessert Extra snack with protein at bedtime  3. Hypertension: Blood pressure is well controlled   4.   Hypothyroidism: She is taking 50 mcg levothyroxine with normal TSH    LIPIDS: She has been able to take Repatha only recently after having interruption of her treatment earlier this year with rising LDL Also will recheck labs on the next visit    Patient Instructions  Supper dose 5-6 units  Snack at bedtime       Reather Littler 08/08/2022, 12:16 PM    Note: This office note was prepared with Dragon voice recognition system technology. Any transcriptional errors that result from this process  are unintentional.

## 2022-08-12 DIAGNOSIS — E1165 Type 2 diabetes mellitus with hyperglycemia: Secondary | ICD-10-CM | POA: Diagnosis not present

## 2022-08-13 ENCOUNTER — Encounter: Payer: Self-pay | Admitting: Endocrinology

## 2022-09-03 ENCOUNTER — Ambulatory Visit: Payer: Medicare (Managed Care)

## 2022-09-03 DIAGNOSIS — R0602 Shortness of breath: Secondary | ICD-10-CM | POA: Diagnosis not present

## 2022-09-11 DIAGNOSIS — E1165 Type 2 diabetes mellitus with hyperglycemia: Secondary | ICD-10-CM | POA: Diagnosis not present

## 2022-09-14 ENCOUNTER — Other Ambulatory Visit (HOSPITAL_COMMUNITY): Payer: Self-pay

## 2022-09-17 ENCOUNTER — Other Ambulatory Visit: Payer: Self-pay | Admitting: Endocrinology

## 2022-09-17 DIAGNOSIS — E1165 Type 2 diabetes mellitus with hyperglycemia: Secondary | ICD-10-CM

## 2022-09-23 DIAGNOSIS — N1831 Chronic kidney disease, stage 3a: Secondary | ICD-10-CM | POA: Diagnosis not present

## 2022-09-23 DIAGNOSIS — K8020 Calculus of gallbladder without cholecystitis without obstruction: Secondary | ICD-10-CM | POA: Diagnosis not present

## 2022-09-23 DIAGNOSIS — E1122 Type 2 diabetes mellitus with diabetic chronic kidney disease: Secondary | ICD-10-CM | POA: Diagnosis not present

## 2022-09-23 DIAGNOSIS — D573 Sickle-cell trait: Secondary | ICD-10-CM | POA: Diagnosis not present

## 2022-09-23 DIAGNOSIS — E1142 Type 2 diabetes mellitus with diabetic polyneuropathy: Secondary | ICD-10-CM | POA: Diagnosis not present

## 2022-09-23 DIAGNOSIS — R140 Abdominal distension (gaseous): Secondary | ICD-10-CM | POA: Diagnosis not present

## 2022-09-23 DIAGNOSIS — R109 Unspecified abdominal pain: Secondary | ICD-10-CM | POA: Diagnosis not present

## 2022-09-24 ENCOUNTER — Encounter: Payer: Self-pay | Admitting: Endocrinology

## 2022-09-24 ENCOUNTER — Other Ambulatory Visit (HOSPITAL_COMMUNITY): Payer: Self-pay | Admitting: Internal Medicine

## 2022-09-24 DIAGNOSIS — R140 Abdominal distension (gaseous): Secondary | ICD-10-CM

## 2022-09-30 DIAGNOSIS — Z961 Presence of intraocular lens: Secondary | ICD-10-CM | POA: Diagnosis not present

## 2022-09-30 DIAGNOSIS — H0102B Squamous blepharitis left eye, upper and lower eyelids: Secondary | ICD-10-CM | POA: Diagnosis not present

## 2022-10-03 DIAGNOSIS — M519 Unspecified thoracic, thoracolumbar and lumbosacral intervertebral disc disorder: Secondary | ICD-10-CM | POA: Diagnosis not present

## 2022-10-03 DIAGNOSIS — K8020 Calculus of gallbladder without cholecystitis without obstruction: Secondary | ICD-10-CM | POA: Diagnosis not present

## 2022-10-03 DIAGNOSIS — N1831 Chronic kidney disease, stage 3a: Secondary | ICD-10-CM | POA: Diagnosis not present

## 2022-10-03 DIAGNOSIS — E1122 Type 2 diabetes mellitus with diabetic chronic kidney disease: Secondary | ICD-10-CM | POA: Diagnosis not present

## 2022-10-03 DIAGNOSIS — E039 Hypothyroidism, unspecified: Secondary | ICD-10-CM | POA: Diagnosis not present

## 2022-10-03 DIAGNOSIS — I10 Essential (primary) hypertension: Secondary | ICD-10-CM | POA: Diagnosis not present

## 2022-10-03 DIAGNOSIS — E11649 Type 2 diabetes mellitus with hypoglycemia without coma: Secondary | ICD-10-CM | POA: Diagnosis not present

## 2022-10-03 DIAGNOSIS — E7800 Pure hypercholesterolemia, unspecified: Secondary | ICD-10-CM | POA: Diagnosis not present

## 2022-10-03 DIAGNOSIS — E1142 Type 2 diabetes mellitus with diabetic polyneuropathy: Secondary | ICD-10-CM | POA: Diagnosis not present

## 2022-10-03 DIAGNOSIS — G720 Drug-induced myopathy: Secondary | ICD-10-CM | POA: Diagnosis not present

## 2022-10-03 DIAGNOSIS — D573 Sickle-cell trait: Secondary | ICD-10-CM | POA: Diagnosis not present

## 2022-10-03 DIAGNOSIS — Z794 Long term (current) use of insulin: Secondary | ICD-10-CM | POA: Diagnosis not present

## 2022-10-10 ENCOUNTER — Encounter (HOSPITAL_COMMUNITY)
Admission: RE | Admit: 2022-10-10 | Discharge: 2022-10-10 | Disposition: A | Discharge status: Home / Self Care | Payer: Medicare (Managed Care) | Source: Ambulatory Visit | Attending: Internal Medicine | Admitting: Internal Medicine

## 2022-10-10 DIAGNOSIS — R140 Abdominal distension (gaseous): Secondary | ICD-10-CM | POA: Insufficient documentation

## 2022-10-10 MED ORDER — TECHNETIUM TC 99M SULFUR COLLOID
2.0000 | Freq: Once | INTRAVENOUS | Status: AC | PRN
  Administered 2022-10-10: 2 via INTRAVENOUS

## 2022-10-11 DIAGNOSIS — E1165 Type 2 diabetes mellitus with hyperglycemia: Secondary | ICD-10-CM | POA: Diagnosis not present

## 2022-10-15 DIAGNOSIS — K5900 Constipation, unspecified: Secondary | ICD-10-CM | POA: Diagnosis not present

## 2022-10-15 DIAGNOSIS — R140 Abdominal distension (gaseous): Secondary | ICD-10-CM | POA: Diagnosis not present

## 2022-10-15 DIAGNOSIS — K8020 Calculus of gallbladder without cholecystitis without obstruction: Secondary | ICD-10-CM | POA: Diagnosis not present

## 2022-10-25 ENCOUNTER — Other Ambulatory Visit: Payer: Self-pay | Admitting: Endocrinology

## 2022-10-25 DIAGNOSIS — Z794 Long term (current) use of insulin: Secondary | ICD-10-CM

## 2022-10-28 ENCOUNTER — Encounter: Payer: Self-pay | Admitting: Endocrinology

## 2022-10-28 DIAGNOSIS — E1165 Type 2 diabetes mellitus with hyperglycemia: Secondary | ICD-10-CM

## 2022-10-29 DIAGNOSIS — R195 Other fecal abnormalities: Secondary | ICD-10-CM | POA: Diagnosis not present

## 2022-10-29 DIAGNOSIS — E119 Type 2 diabetes mellitus without complications: Secondary | ICD-10-CM | POA: Diagnosis not present

## 2022-10-29 MED ORDER — TRESIBA FLEXTOUCH 100 UNIT/ML ~~LOC~~ SOPN
24.0000 [IU] | PEN_INJECTOR | Freq: Every day | SUBCUTANEOUS | 3 refills | Status: DC
Start: 2022-10-29 — End: 2023-04-22

## 2022-11-11 DIAGNOSIS — E1165 Type 2 diabetes mellitus with hyperglycemia: Secondary | ICD-10-CM | POA: Diagnosis not present

## 2022-11-20 ENCOUNTER — Encounter: Payer: Self-pay | Admitting: Endocrinology

## 2022-11-21 ENCOUNTER — Other Ambulatory Visit: Payer: Self-pay

## 2022-11-21 MED ORDER — INSULIN LISPRO (1 UNIT DIAL) 100 UNIT/ML (KWIKPEN): PEN_INJECTOR | SUBCUTANEOUS | 2 refills | Status: DC

## 2022-11-26 ENCOUNTER — Other Ambulatory Visit: Payer: Self-pay

## 2022-11-26 DIAGNOSIS — E1165 Type 2 diabetes mellitus with hyperglycemia: Secondary | ICD-10-CM

## 2022-11-26 MED ORDER — INSULIN LISPRO (1 UNIT DIAL) 100 UNIT/ML (KWIKPEN): PEN_INJECTOR | SUBCUTANEOUS | 2 refills | Status: DC

## 2022-12-06 DIAGNOSIS — Z96651 Presence of right artificial knee joint: Secondary | ICD-10-CM | POA: Diagnosis not present

## 2022-12-06 DIAGNOSIS — M7061 Trochanteric bursitis, right hip: Secondary | ICD-10-CM | POA: Diagnosis not present

## 2022-12-10 ENCOUNTER — Other Ambulatory Visit (INDEPENDENT_AMBULATORY_CARE_PROVIDER_SITE_OTHER): Payer: Medicare (Managed Care)

## 2022-12-10 DIAGNOSIS — E119 Type 2 diabetes mellitus without complications: Secondary | ICD-10-CM | POA: Diagnosis not present

## 2022-12-10 DIAGNOSIS — Z794 Long term (current) use of insulin: Secondary | ICD-10-CM

## 2022-12-10 DIAGNOSIS — E782 Mixed hyperlipidemia: Secondary | ICD-10-CM | POA: Diagnosis not present

## 2022-12-10 LAB — LIPID PANEL
Cholesterol: 183 mg/dL (ref 0–200)
HDL: 51.5 mg/dL (ref >39.00)
LDL Cholesterol: 113 mg/dL — ABNORMAL HIGH (ref 0–99)
NonHDL: 131.9
Total CHOL/HDL Ratio: 4
Triglycerides: 93 mg/dL (ref 0.0–149.0)
VLDL: 18.6 mg/dL (ref 0.0–40.0)

## 2022-12-10 LAB — COMPREHENSIVE METABOLIC PANEL
ALT: 9 U/L (ref 0–35)
AST: 12 U/L (ref 0–37)
Albumin: 4.5 g/dL (ref 3.5–5.2)
Alkaline Phosphatase: 55 U/L (ref 39–117)
BUN: 19 mg/dL (ref 6–23)
CO2: 27 mEq/L (ref 19–32)
Calcium: 9.8 mg/dL (ref 8.4–10.5)
Chloride: 103 mEq/L (ref 96–112)
Creatinine, Ser: 1.04 mg/dL (ref 0.40–1.20)
GFR: 49.89 mL/min — ABNORMAL LOW (ref >60.00)
Glucose, Bld: 135 mg/dL — ABNORMAL HIGH (ref 70–99)
Potassium: 3.8 mEq/L (ref 3.5–5.1)
Sodium: 141 mEq/L (ref 135–145)
Total Bilirubin: 0.7 mg/dL (ref 0.2–1.2)
Total Protein: 7.1 g/dL (ref 6.0–8.3)

## 2022-12-10 LAB — HEMOGLOBIN A1C: Hgb A1c MFr Bld: 6.4 % (ref 4.6–6.5)

## 2022-12-11 DIAGNOSIS — E1165 Type 2 diabetes mellitus with hyperglycemia: Secondary | ICD-10-CM | POA: Diagnosis not present

## 2022-12-12 ENCOUNTER — Ambulatory Visit (INDEPENDENT_AMBULATORY_CARE_PROVIDER_SITE_OTHER): Payer: Medicare (Managed Care) | Admitting: Endocrinology

## 2022-12-12 ENCOUNTER — Encounter: Payer: Self-pay | Admitting: Endocrinology

## 2022-12-12 VITALS — BP 114/68 | HR 67 | Ht 63.0 in | Wt 166.4 lb

## 2022-12-12 DIAGNOSIS — E782 Mixed hyperlipidemia: Secondary | ICD-10-CM | POA: Diagnosis not present

## 2022-12-12 DIAGNOSIS — E1165 Type 2 diabetes mellitus with hyperglycemia: Secondary | ICD-10-CM | POA: Diagnosis not present

## 2022-12-12 DIAGNOSIS — Z7985 Long-term (current) use of injectable non-insulin antidiabetic drugs: Secondary | ICD-10-CM | POA: Diagnosis not present

## 2022-12-12 DIAGNOSIS — Z794 Long term (current) use of insulin: Secondary | ICD-10-CM | POA: Diagnosis not present

## 2022-12-12 DIAGNOSIS — E063 Autoimmune thyroiditis: Secondary | ICD-10-CM | POA: Diagnosis not present

## 2022-12-12 NOTE — Progress Notes (Signed)
Patient ID: [name], female   DOB: 1940-06-09, 82 y.o.   MRN: [id number]      Reason for Appointment: Endocrinology follow-up  History of Present Illness    PROBLEM 1: Type 2 diabetes mellitus, date of diagnosis: 1998.   Prior history: She had previously been treated with Byetta, Glumetza, Amaryl, Victoza and  Onglyza However because of inadequate control and intolerance to drugs she was finally given pre-meal insulin along with Amaryl, Glumetza eventually stopped because of side effects and also Lantus added  Recent history:  The insulin regimen is: 6 at breakfast and 9 lunch, 8 acs Tresiba 8 units daily  Non-insulin hypoglycemic drugs : Farxiga 5 mg daily, Ozempic 0.5 mg weekly  Her A1c is 6.4  A1c previous range 5.9--7.4  Current blood sugar patterns and problems with management:   Although she has no change her overall management her A1c is higher This may be related to occasional steroid injections, variable diet and occasionally high postprandial readings  Overall still managing to control portions with Ozempic and has no nausea with this Still requiring relatively small amounts of insulin although appears to be needing less insulin at dinnertime recently She is trying to do some walking Recently weight is about the same  Side effects from diabetes medications: Victoza caused nausea.  Metformin  CONTINUOUS GLUCOSE MONITORING RECORD INTERPRETATION    Dates of Recording: Last 2 weeks  Sensor description: Dexcom  Results statistics:   CGM use % of time   Average and SD 130/24  Time in range  92      %  % Time Above 180 7  % Time above 250   % Time Below target 1    Glycemic patterns summary: Blood sugars are overall well-controlled and only having occasional hypoglycemia overnight or morning hours  Hyperglycemic episodes are seen sporadically early part of the night and morning hours, rarely after lunch  Hypoglycemic episodes are seen  rarely overnight but may be artifacts of inaccurate data  Overnight periods: Generally stable with occasional hyperglycemic episodes early part of the night  Preprandial periods: Stable and in the low to mid 100 range, lower at dinnertime  Postprandial periods:   Fairly consistently even with Premeal readings on an average Blood sugars are spiking occasionally after breakfast and rarely after lunch or dinner with lowest readings overall after dinner  Previously: AVERAGE on the sensor = 139 Time in range = 85% Blood sugars below 70 = 1%  Certified Diabetes Educator visit: Most recent: 7/13.  Dietician visit: Most recent: 3/13.   Wt Readings from Last 3 Encounters:  12/12/22 166 lb 6.4 oz (75.5 kg)  08/08/22 167 lb 3.2 oz (75.8 kg)  08/06/22 168 lb 3.2 oz (76.3 kg)   Complications: Neuropathy  LABS:  Lab Results  Component Value Date   HGBA1C 6.4 12/10/2022   HGBA1C 5.9 (A) 08/08/2022   HGBA1C 6.2 06/06/2022   Lab Results  Component Value Date   MICROALBUR 0.9 06/06/2022   LDLCALC 113 (H) 12/10/2022   CREATININE 1.04 12/10/2022   Lab Results  Component Value Date   FRUCTOSAMINE 283 06/09/2018   FRUCTOSAMINE 296 (H) 05/16/2017   FRUCTOSAMINE 322 (H) 10/10/2016    Multiple other issues are addressed in review of systems  Lab on 12/10/2022  Component Date Value Ref Range Status   Cholesterol 12/10/2022 183  0 - 200 mg/dL Final   ATP III Classification       Desirable:  < 200  mg/dL               Borderline High:  200 - 239 mg/dL          High:  > = 161 mg/dL   Triglycerides 09/60/4540 93.0  0.0 - 149.0 mg/dL Final   Normal:  <981 mg/dLBorderline High:  150 - 199 mg/dL   HDL 19/14/7829 56.21  >39.00 mg/dL Final   VLDL 30/86/5784 18.6  0.0 - 40.0 mg/dL Final   LDL Cholesterol 12/10/2022 113 (H)  0 - 99 mg/dL Final   Total CHOL/HDL Ratio 12/10/2022 4   Final                  Men          Women1/2 Average Risk     3.4          3.3Average Risk          5.0           4.42X Average Risk          9.6          7.13X Average Risk          15.0          11.0                       NonHDL 12/10/2022 131.90   Final   NOTE:  Non-HDL goal should be 30 mg/dL higher than patient's LDL goal (i.e. LDL goal of < 70 mg/dL, would have non-HDL goal of < 100 mg/dL)   Sodium 69/62/9528 413  135 - 145 mEq/L Final   Potassium 12/10/2022 3.8  3.5 - 5.1 mEq/L Final   Chloride 12/10/2022 103  96 - 112 mEq/L Final   CO2 12/10/2022 27  19 - 32 mEq/L Final   Glucose, Bld 12/10/2022 135 (H)  70 - 99 mg/dL Final   BUN 24/40/1027 19  6 - 23 mg/dL Final   Creatinine, Ser 12/10/2022 1.04  0.40 - 1.20 mg/dL Final   Total Bilirubin 12/10/2022 0.7  0.2 - 1.2 mg/dL Final   Alkaline Phosphatase 12/10/2022 55  39 - 117 U/L Final   AST 12/10/2022 12  0 - 37 U/L Final   ALT 12/10/2022 9  0 - 35 U/L Final   Total Protein 12/10/2022 7.1  6.0 - 8.3 g/dL Final   Albumin 25/36/6440 4.5  3.5 - 5.2 g/dL Final   GFR 34/74/2595 49.89 (L)  >60.00 mL/min Final   Calculated using the CKD-EPI Creatinine Equation (2021)   Calcium 12/10/2022 9.8  8.4 - 10.5 mg/dL Final   Hgb G3O MFr Bld 12/10/2022 6.4  4.6 - 6.5 % Final   Glycemic Control Guidelines for People with Diabetes:Non Diabetic:  <6%Goal of Therapy: <7%Additional Action Suggested:  >8%       Allergies as of 12/12/2022       Reactions   Cymbalta [duloxetine Hcl] Diarrhea, Other (See Comments)   Dizziness, headache, irritability   Amlodipine Besylate Swelling   Baclofen    jerks    Betadine [povidone Iodine] Swelling, Other (See Comments)   SWELLING REACTION DESCRIPTION/SEVERITY UNSPECIFIED  Reaction to betadine on eye lid and it swelled up   Erythromycin Base Other (See Comments)   unknown   Oxycodone Hcl    Percocet [oxycodone-acetaminophen] Other (See Comments)   Confusion change personality   Povidone-iodine Swelling   Soap Other (See Comments)   unknown   Statins Other (See Comments)  Muscle cramps   Adhesive [tape] Rash    Lipitor [atorvastatin] Rash        Medication List        Accurate as of December 12, 2022  3:03 PM. If you have any questions, ask your nurse or doctor.          STOP taking these medications    NovoLOG FlexPen 100 UNIT/ML FlexPen Generic drug: insulin aspart Stopped by: Reather Littler       TAKE these medications    Accu-Chek Aviva Plus test strip Generic drug: glucose blood TEST BLOOD SUGAR FOUR TIMES DAILY AS DIRECTED   Accu-Chek Aviva Plus w/Device Kit Use Accu Chek Aviva as instructed to check blood sugar 4 times daily.   acetaminophen 500 MG tablet Commonly known as: TYLENOL Take 1,000 mg by mouth every 8 (eight) hours as needed for mild pain or moderate pain.   BD Pen Needle Nano U/F 32G X 4 MM Misc Generic drug: Insulin Pen Needle Inject insulin 4 times a day   Cal-Mag-Zinc-D Tabs Take 1 tablet by mouth daily. 330 mg /130 mg/ 5 mg /5 mg   cetirizine 10 MG tablet Commonly known as: ZYRTEC Take 1 tablet (10 mg total) by mouth daily as needed for allergies. What changed: when to take this   Dexcom G7 Sensor Misc 1 Device by Does not apply route as directed. Change sensor every 10 days   Farxiga 5 MG Tabs tablet Generic drug: dapagliflozin propanediol TAKE 1 TABLET DAILY   fluticasone 50 MCG/ACT nasal spray Commonly known as: FLONASE Place 1 spray into both nostrils daily as needed for allergies. What changed:  how much to take when to take this   gabapentin 600 MG tablet Commonly known as: NEURONTIN TAKE 1 TABLET(600 MG) BY MOUTH THREE TIMES DAILY What changed: See the new instructions.   HYDROcodone-acetaminophen 5-325 MG tablet Commonly known as: NORCO/VICODIN Take 1 tablet by mouth every 6 (six) hours as needed.   insulin lispro 100 UNIT/ML KwikPen Commonly known as: HumaLOG KwikPen INJECT 6 UNITS BEFORE BREAKFAST, 9 UNITS AT LUNCH, 8-12 UNITS AT DINNER, 3-4 UNITS AS NEEDED FOR LARGE CARBS OR ICE CREAM   irbesartan-hydrochlorothiazide  150-12.5 MG tablet Commonly known as: AVALIDE Take 1 tablet by mouth daily.   ketoconazole 2 % cream Commonly known as: NIZORAL Apply 1 application topically daily as needed (Itching).   levothyroxine 50 MCG tablet Commonly known as: SYNTHROID TAKE 1 TABLET DAILY   Ozempic (0.25 or 0.5 MG/DOSE) 2 MG/3ML Sopn Generic drug: Semaglutide(0.25 or 0.5MG /DOS) INJECT 0.5 MG ONCE WEEKLY   pantoprazole 40 MG tablet Commonly known as: PROTONIX Take 1 tablet by mouth as needed.   Repatha Pushtronex System 420 MG/3.5ML Soct Generic drug: Evolocumab with Infusor INJECT 420 MG SUBCUTANEOUSLY EVERY 30 DAYS   Symbicort 80-4.5 MCG/ACT inhaler Generic drug: budesonide-formoterol Inhale 2 puffs into the lungs 2 (two) times daily as needed (shortness of breath). What changed: when to take this   [place] FlexTouch 100 UNIT/ML FlexTouch Pen Generic drug: insulin degludec Inject 24 Units into the skin daily.        Allergies:  Allergies  Allergen Reactions   Cymbalta [Duloxetine Hcl] Diarrhea and Other (See Comments)    Dizziness, headache, irritability   Amlodipine Besylate Swelling   Baclofen     jerks    Betadine [Povidone Iodine] Swelling and Other (See Comments)    SWELLING REACTION DESCRIPTION/SEVERITY UNSPECIFIED  Reaction to betadine on eye lid and it swelled up  Erythromycin Base Other (See Comments)    unknown   Oxycodone Hcl    Percocet [Oxycodone-Acetaminophen] Other (See Comments)    Confusion change personality   Povidone-Iodine Swelling   Soap Other (See Comments)    unknown   Statins Other (See Comments)    Muscle cramps   Adhesive [Tape] Rash   Lipitor [Atorvastatin] Rash    Past Medical History:  Diagnosis Date   Acute blood loss anemia    Acute encephalopathy 07/15/2016   AKI (acute kidney injury) (HCC)    Anemia    has sickle cell trait   Anxiety    Arthritis    Asthma    has used inhaler in past for asthmatic bronchitis, last time- early 2012    Bilateral primary osteoarthritis of knee 03/26/2016   Complication of anesthesia    wakes up shaking   Diabetes mellitus    Diabetes mellitus with neuropathy (HCC) 10/29/2012   Diverticulitis    Dyslipidemia    Dyspnea    with exertion   Encephalopathy 06/2016   due to medications after surgery   Fibromyalgia    GERD (gastroesophageal reflux disease)    occas. use of  Prilosec   Heart murmur    sees Dr. Shana Chute, last seen- early 2012   Herniated nucleus pulposus, L2-3 06/25/2016   History of back surgery    Hypertension    02/2010- stress test /w PCP   Hypothyroidism    Loose bowel movements 12/2016   Lumbar radiculopathy 06/27/2016   Lumbar stenosis with neurogenic claudication 03/17/2017   Memory disorder 02/16/2016   Myoclonic jerking    Neuromuscular disorder (HCC)    lumbar radiculopathy, lumbago   Nocturnal leg cramps 09/27/2014   Osteoporosis 03/10/2014   Pneumonia    Sickle cell trait (HCC)    Sleep apnea    borderline sleep apnea, states she no longer uses, early 2012- stopped using    Spinal stenosis of lumbar region with radiculopathy 02/28/2014   Spondylolisthesis of lumbar region 03/19/2011   Type II or unspecified type diabetes mellitus without mention of complication, uncontrolled 07/08/2013    Past Surgical History:  Procedure Laterality Date   ABDOMINAL HYSTERECTOMY     adb.cyst     ovarian cyst   BACK SURGERY     2012, 2015 (3 total)   BACK SURGERY  2018   02/24/2017   COLONOSCOPY     EYE SURGERY     macular degeneration treatment - injections   FEMUR IM NAIL Left 06/13/2018   Procedure: INTRAMEDULLARY (IM) NAIL FEMORAL;  Surgeon: Kathryne Hitch, MD;  Location: [facility];  Service: Orthopedics;  Laterality: Left;   OVARIAN CYST SURGERY     POSTERIOR LUMBAR FUSION 4 LEVEL N/A 03/17/2017   Procedure: Decompression of Lumbar One-Two with Thoracic Ten to Lumbar Two Fusion;  Surgeon: Barnett Abu, MD;  Location: [facility];  Service: Neurosurgery;   Laterality: N/A;  Decompression of L1-2 with T10 to L2 Fusion   TOTAL KNEE ARTHROPLASTY Right 08/13/2019   Procedure: RIGHT TOTAL KNEE ARTHROPLASTY;  Surgeon: Kathryne Hitch, MD;  Location: [facility];  Service: Orthopedics;  Laterality: Right;   TOTAL KNEE REVISION Right 07/05/2020   Procedure: TOTAL KNEE REVISION;  Surgeon: Ollen Gross, MD;  Location: [facility];  Service: Orthopedics;  Laterality: Right;     Family History  Problem Relation Age of Onset   Ovarian cancer Mother    Cancer - Prostate Father    Breast cancer Paternal Aunt  Multiple myeloma Paternal Aunt    Anesthesia problems Neg Hx    Hypotension Neg Hx    Malignant hyperthermia Neg Hx    Pseudochol deficiency Neg Hx     Social History:  reports that she quit smoking about 19 years ago. Her smoking use included cigarettes. She started smoking about 49 years ago. She has a 3 pack-year smoking history. She has never used smokeless tobacco. She reports current alcohol use. She reports that she does not use drugs.  ROS    NEUROPATHY: She has had tingling in feet and legs, mostly at night. She is taking gabapentin   Hyperlipidemia:   The lipid abnormality consists of elevated LDL persistently Did not tolerate Crestor or lovastatin because of muscle cramps  She stopped Zetia presumably because she was having muscle cramps  She was started on Repatha in 2021  Previously was having some pain with the injection device and is using the monthly pump device She is using the device on her leg as she feels less discomfort there  Occasionally she may have difficulties with consistent supply of the medication but not clear why LDL is still higher than last year  LDL results as follows:  Lab Results  Component Value Date   CHOL 183 12/10/2022   CHOL 180 06/06/2022   CHOL 140 07/12/2021   Lab Results  Component Value Date   HDL 51.50 12/10/2022   HDL 50.10 06/06/2022   HDL 48.10 07/12/2021   Lab  Results  Component Value Date   LDLCALC 113 (H) 12/10/2022   LDLCALC 112 (H) 06/06/2022   LDLCALC 74 07/12/2021   Lab Results  Component Value Date   TRIG 93.0 12/10/2022   TRIG 90.0 06/06/2022   TRIG 90.0 07/12/2021   Lab Results  Component Value Date   CHOLHDL 4 12/10/2022   CHOLHDL 4 06/06/2022   CHOLHDL 3 07/12/2021   Lab Results  Component Value Date   LDLDIRECT 166.0 02/09/2019   LDLDIRECT 115.0 12/18/2016   LDLDIRECT 134.0 10/10/2016      HYPERTENSION:  Has been present for several years.   She is on Avalide 150/12.5 mg,  Also on Bystolic 5 mg from PCP    BP Readings from Last 3 Encounters:  12/12/22 114/68  08/08/22 122/74  08/06/22 (!) 146/76   . Has a history of leg and muscle cramps and takes magnesium supplements twice a day Still has some muscle cramps  Creatinine and urine microalbumin normal, explained that her GFR is a calculated number based on age  Lab Results  Component Value Date   CREATININE 1.04 12/10/2022   CREATININE 1.05 06/06/2022   CREATININE 1.03 01/29/2022     Lab Results  Component Value Date   K 3.8 12/10/2022      Hypothyroidism  She had a relatively high TSH as of 4/17 and was empirically given Synthroid 25 g Subsequently has been on 50 mcg which she takes regularly No unusual fatigue or weight change  TSH history as follows   Lab Results  Component Value Date   TSH 2.65 06/06/2022   TSH 2.97 10/18/2021   TSH 2.58 07/12/2021   FREET4 1.06 09/14/2019   FREET4 0.88 02/09/2019   FREET4 0.97 10/14/2018         Examination:   BP 114/68   Pulse 67   Ht 5\' 3"  (1.6 m)   Wt 166 lb 6.4 oz (75.5 kg)   LMP  (LMP Unknown)   SpO2 97%   BMI  29.48 kg/m   Body mass index is 29.48 kg/m.   Diabetic Foot Exam - Simple   Simple Foot Form Diabetic Foot exam was performed with the following findings: Yes   Visual Inspection No deformities, no ulcerations, no other skin breakdown bilaterally: Yes Sensation  Testing Intact to touch and monofilament testing bilaterally: Yes Pulse Check Posterior Tibialis and Dorsalis pulse intact bilaterally: Yes Comments      Assesment/Plan:   1. DIABETES type 2 on insulin  See history of present illness for detailed discussion of management, blood sugar patterns and problems identified  She is on basal bolus insulin, Ozempic 0.5 and Farxiga 5 mg daily  A1c is 6.4  Again requiring relatively low-dose of insulin both basal and bolus with fairly stable blood sugars and 92% within the target range Currently not having hypoglycemia and occasionally Dexcom may be inaccurate overnight However appears to be having low normal readings at times after dinner since she is eating a smaller meal  She will not change her doses of medications except reduced to suppertime coverage by 1 to 2 units Stay on Farxiga and Ozempic unchanged  3. Hypertension: Blood pressure is well controlled   4.   Hypothyroidism: She is taking 50 mcg levothyroxine and will check TSH on next visit   LIPIDS: Still not well-controlled even though she thinks she is taking Repatha mostly regularly Has been intolerant to other drugs including Zetia in the past   There are no Patient Instructions on file for this visit.       Reather Littler 12/12/2022, 3:03 PM    Note: This office note was prepared with Dragon voice recognition system technology. Any transcriptional errors that result from this process are unintentional.

## 2022-12-13 ENCOUNTER — Other Ambulatory Visit: Payer: Self-pay | Admitting: Endocrinology

## 2022-12-13 DIAGNOSIS — E782 Mixed hyperlipidemia: Secondary | ICD-10-CM

## 2022-12-16 DIAGNOSIS — M542 Cervicalgia: Secondary | ICD-10-CM | POA: Diagnosis not present

## 2022-12-16 DIAGNOSIS — M5416 Radiculopathy, lumbar region: Secondary | ICD-10-CM | POA: Diagnosis not present

## 2022-12-16 DIAGNOSIS — M791 Myalgia, unspecified site: Secondary | ICD-10-CM | POA: Diagnosis not present

## 2022-12-17 ENCOUNTER — Encounter: Payer: Self-pay | Admitting: Endocrinology

## 2023-01-10 DIAGNOSIS — E1165 Type 2 diabetes mellitus with hyperglycemia: Secondary | ICD-10-CM | POA: Diagnosis not present

## 2023-01-21 DIAGNOSIS — H0102A Squamous blepharitis right eye, upper and lower eyelids: Secondary | ICD-10-CM | POA: Diagnosis not present

## 2023-01-21 DIAGNOSIS — H0102B Squamous blepharitis left eye, upper and lower eyelids: Secondary | ICD-10-CM | POA: Diagnosis not present

## 2023-01-21 DIAGNOSIS — H00025 Hordeolum internum left lower eyelid: Secondary | ICD-10-CM | POA: Diagnosis not present

## 2023-01-24 ENCOUNTER — Encounter: Payer: Self-pay | Admitting: Endocrinology

## 2023-02-11 DIAGNOSIS — E1165 Type 2 diabetes mellitus with hyperglycemia: Secondary | ICD-10-CM | POA: Diagnosis not present

## 2023-02-13 ENCOUNTER — Ambulatory Visit: Payer: Medicare (Managed Care) | Admitting: Podiatry

## 2023-03-06 DIAGNOSIS — Z9989 Dependence on other enabling machines and devices: Secondary | ICD-10-CM | POA: Diagnosis not present

## 2023-03-06 DIAGNOSIS — N1831 Chronic kidney disease, stage 3a: Secondary | ICD-10-CM | POA: Diagnosis not present

## 2023-03-06 DIAGNOSIS — G720 Drug-induced myopathy: Secondary | ICD-10-CM | POA: Diagnosis not present

## 2023-03-06 DIAGNOSIS — J45909 Unspecified asthma, uncomplicated: Secondary | ICD-10-CM | POA: Diagnosis not present

## 2023-03-06 DIAGNOSIS — R269 Unspecified abnormalities of gait and mobility: Secondary | ICD-10-CM | POA: Diagnosis not present

## 2023-03-06 DIAGNOSIS — Z1331 Encounter for screening for depression: Secondary | ICD-10-CM | POA: Diagnosis not present

## 2023-03-06 DIAGNOSIS — E1142 Type 2 diabetes mellitus with diabetic polyneuropathy: Secondary | ICD-10-CM | POA: Diagnosis not present

## 2023-03-06 DIAGNOSIS — Z0000 Encounter for general adult medical examination without abnormal findings: Secondary | ICD-10-CM | POA: Diagnosis not present

## 2023-03-06 DIAGNOSIS — E039 Hypothyroidism, unspecified: Secondary | ICD-10-CM | POA: Diagnosis not present

## 2023-03-06 DIAGNOSIS — E1122 Type 2 diabetes mellitus with diabetic chronic kidney disease: Secondary | ICD-10-CM | POA: Diagnosis not present

## 2023-03-06 DIAGNOSIS — Z23 Encounter for immunization: Secondary | ICD-10-CM | POA: Diagnosis not present

## 2023-03-06 DIAGNOSIS — M810 Age-related osteoporosis without current pathological fracture: Secondary | ICD-10-CM | POA: Diagnosis not present

## 2023-03-06 DIAGNOSIS — I10 Essential (primary) hypertension: Secondary | ICD-10-CM | POA: Diagnosis not present

## 2023-03-07 DIAGNOSIS — E1142 Type 2 diabetes mellitus with diabetic polyneuropathy: Secondary | ICD-10-CM | POA: Diagnosis not present

## 2023-03-11 ENCOUNTER — Encounter: Payer: Self-pay | Admitting: Podiatry

## 2023-03-11 ENCOUNTER — Ambulatory Visit (INDEPENDENT_AMBULATORY_CARE_PROVIDER_SITE_OTHER): Payer: Medicare (Managed Care) | Admitting: Podiatry

## 2023-03-11 VITALS — Ht 63.0 in | Wt 167.0 lb

## 2023-03-11 DIAGNOSIS — E1142 Type 2 diabetes mellitus with diabetic polyneuropathy: Secondary | ICD-10-CM | POA: Diagnosis not present

## 2023-03-11 DIAGNOSIS — L600 Ingrowing nail: Secondary | ICD-10-CM

## 2023-03-11 NOTE — Progress Notes (Signed)
  Subjective:  Patient ID: [name], female    DOB: April 03, 1941,  MRN: [id number]  Chief Complaint  Patient presents with   Diabetes    RM3: diabetic foot care/hx of neuropathy A1c: 6.4( 12/10/2022)    Discussed the use of AI scribe software for clinical note transcription with the patient, who gave verbal consent to proceed.  History of Present Illness   The patient, with a history of diabetes and multiple surgeries, presents with concerns about her feet. She reports having ingrown toenails, particularly on her big toe, but expresses a strong aversion to further surgeries due to her extensive surgical history. She also mentions experiencing neuropathy, a common complication of diabetes. Despite these issues, she reports that her blood sugar levels are generally well-controlled, with her last A1c reading at 6.5. She also mentions that she has a regular nail care routine and that her nails grow quickly. The patient's neuropathy occasionally causes discomfort at night, but she has not experienced any infections or significant pain from her ingrown toenails.          Objective:    Physical Exam   CARDIOVASCULAR: DP and PT pulses strong and palpable, graded as +2. EXTREMITIES: Incurved pincer nails bilaterally on hallux. NEUROLOGICAL: Abnormal sensation indicative of diffuse peripheral neuropathy. SKIN: Dystrophy and thickening of nails.       No images are attached to the encounter.    Results   Procedure: Toenail trimming Description: Debridement of all toenails in length and thickness with a sharp nail nipper to tolerance, cleaned corners of bilateral hallux  LABS A1c: 6.5      Assessment:   1. Ingrowing left great toenail   2. Ingrowing right great toenail   3. Type 2 diabetes mellitus with polyneuropathy (HCC)      Plan:  Patient was evaluated and treated and all questions answered.  Assessment and Plan    Diabetic Peripheral Neuropathy   She exhibits no  current pain or discomfort and maintains good blood sugar control, evidenced by the last A1c at 6.5. We will continue her current management.  Ingrown Toenails   She is not currently experiencing pain from ingrown toenails and expresses reluctance towards surgery due to past experiences. During the visit, we trimmed her toenails to alleviate discomfort. We scheduled regular follow-ups every three months for diabetic foot care and nail trimming with Dr. Eloy End or Dr. Stacie Acres and advised her to continue with her current nail care routine.  General Health Maintenance   We will continue monitoring her blood sugar levels and managing diabetes, with follow-ups as needed.          Return in about 3 months (around 06/11/2023) for at risk diabetic foot care.

## 2023-03-12 ENCOUNTER — Encounter: Payer: Self-pay | Admitting: Endocrinology

## 2023-03-17 NOTE — Telephone Encounter (Signed)
I would recommend to decrease Ozempic to 0.25 mg weekly.

## 2023-03-20 ENCOUNTER — Encounter: Payer: Self-pay | Admitting: Endocrinology

## 2023-03-27 DIAGNOSIS — M8588 Other specified disorders of bone density and structure, other site: Secondary | ICD-10-CM | POA: Diagnosis not present

## 2023-04-01 ENCOUNTER — Other Ambulatory Visit: Payer: Self-pay

## 2023-04-01 DIAGNOSIS — E1165 Type 2 diabetes mellitus with hyperglycemia: Secondary | ICD-10-CM

## 2023-04-01 DIAGNOSIS — E782 Mixed hyperlipidemia: Secondary | ICD-10-CM

## 2023-04-01 DIAGNOSIS — E063 Autoimmune thyroiditis: Secondary | ICD-10-CM

## 2023-04-08 ENCOUNTER — Other Ambulatory Visit: Payer: Medicare (Managed Care)

## 2023-04-09 ENCOUNTER — Other Ambulatory Visit: Payer: Self-pay

## 2023-04-09 DIAGNOSIS — E1165 Type 2 diabetes mellitus with hyperglycemia: Secondary | ICD-10-CM

## 2023-04-09 MED ORDER — DAPAGLIFLOZIN PROPANEDIOL 5 MG PO TABS: 5.0000 mg | ORAL_TABLET | Freq: Every day | ORAL | 3 refills | Status: DC

## 2023-04-10 ENCOUNTER — Ambulatory Visit: Payer: Medicare (Managed Care) | Admitting: Endocrinology

## 2023-04-10 DIAGNOSIS — M542 Cervicalgia: Secondary | ICD-10-CM | POA: Diagnosis not present

## 2023-04-10 DIAGNOSIS — M7918 Myalgia, other site: Secondary | ICD-10-CM | POA: Diagnosis not present

## 2023-04-10 DIAGNOSIS — M961 Postlaminectomy syndrome, not elsewhere classified: Secondary | ICD-10-CM | POA: Diagnosis not present

## 2023-04-12 DIAGNOSIS — E1165 Type 2 diabetes mellitus with hyperglycemia: Secondary | ICD-10-CM | POA: Diagnosis not present

## 2023-04-22 ENCOUNTER — Other Ambulatory Visit: Payer: Self-pay | Admitting: Endocrinology

## 2023-04-22 ENCOUNTER — Encounter: Payer: Self-pay | Admitting: Endocrinology

## 2023-04-22 ENCOUNTER — Ambulatory Visit (INDEPENDENT_AMBULATORY_CARE_PROVIDER_SITE_OTHER): Payer: Medicare (Managed Care) | Admitting: Endocrinology

## 2023-04-22 VITALS — BP 136/70 | HR 80 | Resp 20 | Ht 63.0 in | Wt 168.8 lb

## 2023-04-22 DIAGNOSIS — E118 Type 2 diabetes mellitus with unspecified complications: Secondary | ICD-10-CM

## 2023-04-22 DIAGNOSIS — Z794 Long term (current) use of insulin: Secondary | ICD-10-CM

## 2023-04-22 DIAGNOSIS — E1165 Type 2 diabetes mellitus with hyperglycemia: Secondary | ICD-10-CM

## 2023-04-22 DIAGNOSIS — E063 Autoimmune thyroiditis: Secondary | ICD-10-CM | POA: Diagnosis not present

## 2023-04-22 LAB — POCT GLYCOSYLATED HEMOGLOBIN (HGB A1C): Hemoglobin A1C: 6.3 % — AB (ref 4.0–5.6)

## 2023-04-22 MED ORDER — INSULIN LISPRO (1 UNIT DIAL) 100 UNIT/ML (KWIKPEN): PEN_INJECTOR | SUBCUTANEOUS | 2 refills | Status: DC

## 2023-04-22 MED ORDER — DAPAGLIFLOZIN PROPANEDIOL 5 MG PO TABS: 5.0000 mg | ORAL_TABLET | Freq: Every day | ORAL | 3 refills | Status: DC

## 2023-04-22 MED ORDER — OZEMPIC (0.25 OR 0.5 MG/DOSE) 2 MG/3ML ~~LOC~~ SOPN: 0.2500 mg | PEN_INJECTOR | SUBCUTANEOUS | 3 refills | Status: DC

## 2023-04-22 MED ORDER — TRESIBA FLEXTOUCH 100 UNIT/ML ~~LOC~~ SOPN: 10.0000 [IU] | PEN_INJECTOR | Freq: Every day | SUBCUTANEOUS | 3 refills | Status: DC

## 2023-04-22 MED ORDER — LEVOTHYROXINE SODIUM 50 MCG PO TABS: ORAL_TABLET | ORAL | 3 refills | Status: DC

## 2023-04-22 NOTE — Patient Instructions (Signed)
No change in diabetes medications.

## 2023-04-22 NOTE — Progress Notes (Signed)
 Outpatient Endocrinology Note [name], MD   Patient's Name: [name]    DOB: [birth year]    MRN: [id number]                                                    REASON OF VISIT: Follow up for type 2 diabetes mellitus  PCP: Ransom Other, MD  HISTORY OF PRESENT ILLNESS:   [name] is a 82 y.o. old female with past medical history listed below, is here for follow up for type 2 diabetes mellitus / hypothyroidism.   Pertinent Diabetes History: Patient was previously seen by Dr. Von and was last time seen in August 2024.  Patient was diagnosed with type 2 diabetes mellitus in 1998.  Initially treated with oral antidiabetic medication and insulin  therapy was added later.  She has been on insulin  therapy for several years.  Chronic Diabetes Complications : Retinopathy: no. Last ophthalmology exam was done on 02/2022, following with ophthalmology regularly.  Nephropathy: CKD IIIa, on ACE/ARB / irbesartan  Peripheral neuropathy: yes, on gabapentin . Coronary artery disease: no Stroke: no  Relevant comorbidities and cardiovascular risk factors: Obesity: no Body mass index is 29.9 kg/m.  Hypertension: Yes  Hyperlipidemia : Yes, on statin   The lipid abnormality consists of elevated LDL persistently. Did not tolerate Crestor  or lovastatin because of muscle cramps. She stopped Zetia  presumably because she was having muscle cramps.   She was started on Repatha  in 2021  Current / Home Diabetic regimen includes:  Non-insulin  hypoglycemic drugs : Farxiga  5 mg daily, Ozempic  0.25 mg weekly.  The insulin  regimen : Humalog  6 at breakfast and 8-10 lunch, 8 with supper, Tresiba  10 units daily in the morning.    Prior diabetic medications: Byetta, Glumetza, Amaryl , Victoza, Onglyza, metformin, Lantus  in the past. Ozempic  0.5 was changed to 0.25 mg due to nausea and change in taste.  Glycemic data:    CONTINUOUS GLUCOSE MONITORING SYSTEM (CGMS) INTERPRETATION: At today's  visit, we reviewed CGM downloads. The full report is scanned in the media. Reviewing the CGM trends, blood glucose are as follows:  Dexcom G7 CGM-  Sensor Download (Sensor download was reviewed and summarized below.) Dates: December 17 to April 21, 2023, 14 days  Glucose Management Indicator: 6.8% Sensor usage : 100 %    Interpretation: -Mostly acceptable blood sugar.  She has occasional hyperglycemia in the morning with breakfast and in the middle of night around 3 AM with a blood sugar in the range of 200-250 range.  She sometimes wake up in the middle of the night with pain and not able to sleep and sometimes eats.  No noted hypoglycemia overnight on CGM.  Blood sugar in the afternoon and in between the meals are acceptable.  No hypoglycemia.  Hypoglycemia: Patient has no hypoglycemic episodes. Patient has hypoglycemia awareness.  Factors modifying glucose control: 1.  Diabetic diet assessment: 3 meals a day.  2.  Staying active or exercising: no formal exercise.  Trying to walk.  3.  Medication compliance: compliant all of the time.  # Primary hypothyroidism : -Diagnosed with primary hypothyroidism with mildly elevated TSH in 2016.  Initially started on levothyroxine  25 mcg daily and increased to 50 mcg daily.  Interval history  Diabetes regimen and Dexcom CGM data reviewed and noted as above.  She has numbness and tingling  of the feet and taking gabapentin .  She denies hypo or hyperthyroid symptoms.  She has been taking levothyroxine  50 mcg daily.  No other complaints today.  Hemoglobin A1c today 6.3%.  She ran out of Farxiga  about 6 days ago and not able to refill.  REVIEW OF SYSTEMS As per history of present illness.   PAST MEDICAL HISTORY: Past Medical History:  Diagnosis Date   Acute blood loss anemia    Acute encephalopathy 07/15/2016   AKI (acute kidney injury) (HCC)    Anemia    has sickle cell trait   Anxiety    Arthritis    Asthma    has used inhaler in  past for asthmatic bronchitis, last time- early 2012   Bilateral primary osteoarthritis of knee 03/26/2016   Complication of anesthesia    wakes up shaking   Diabetes mellitus    Diabetes mellitus with neuropathy (HCC) 10/29/2012   Diverticulitis    Dyslipidemia    Dyspnea    with exertion   Encephalopathy 06/2016   due to medications after surgery   Fibromyalgia    GERD (gastroesophageal reflux disease)    occas. use of  Prilosec   Heart murmur    sees Dr. Andria, last seen- early 2012   Herniated nucleus pulposus, L2-3 06/25/2016   History of back surgery    Hypertension    02/2010- stress test /w PCP   Hypothyroidism    Loose bowel movements 12/2016   Lumbar radiculopathy 06/27/2016   Lumbar stenosis with neurogenic claudication 03/17/2017   Memory disorder 02/16/2016   Myoclonic jerking    Neuromuscular disorder (HCC)    lumbar radiculopathy, lumbago   Nocturnal leg cramps 09/27/2014   Osteoporosis 03/10/2014   Pneumonia    Sickle cell trait (HCC)    Sleep apnea    borderline sleep apnea, states she no longer uses, early 2012- stopped using    Spinal stenosis of lumbar region with radiculopathy 02/28/2014   Spondylolisthesis of lumbar region 03/19/2011   Type II or unspecified type diabetes mellitus without mention of complication, uncontrolled 07/08/2013    PAST SURGICAL HISTORY: Past Surgical History:  Procedure Laterality Date   ABDOMINAL HYSTERECTOMY     adb.cyst     ovarian cyst   BACK SURGERY     2012, 2015 (3 total)   BACK SURGERY  2018   02/24/2017   COLONOSCOPY     EYE SURGERY     macular degeneration treatment - injections   FEMUR IM NAIL Left 06/13/2018   Procedure: INTRAMEDULLARY (IM) NAIL FEMORAL;  Surgeon: Vernetta Lonni GRADE, MD;  Location: [facility];  Service: Orthopedics;  Laterality: Left;   OVARIAN CYST SURGERY     POSTERIOR LUMBAR FUSION 4 LEVEL N/A 03/17/2017   Procedure: Decompression of Lumbar One-Two with Thoracic Ten to Lumbar Two Fusion;   Surgeon: Colon Shove, MD;  Location: [facility];  Service: Neurosurgery;  Laterality: N/A;  Decompression of L1-2 with T10 to L2 Fusion   TOTAL KNEE ARTHROPLASTY Right 08/13/2019   Procedure: RIGHT TOTAL KNEE ARTHROPLASTY;  Surgeon: Vernetta Lonni GRADE, MD;  Location: [facility];  Service: Orthopedics;  Laterality: Right;   TOTAL KNEE REVISION Right 07/05/2020   Procedure: TOTAL KNEE REVISION;  Surgeon: Melodi Lerner, MD;  Location: [facility];  Service: Orthopedics;  Laterality: Right;     ALLERGIES: Allergies  Allergen Reactions   Cymbalta  [Duloxetine  Hcl] Diarrhea and Other (See Comments)    Dizziness, headache, irritability   Amlodipine  Besylate Swelling  Baclofen      jerks    Betadine  [Povidone Iodine ] Swelling and Other (See Comments)    SWELLING REACTION DESCRIPTION/SEVERITY UNSPECIFIED  Reaction to betadine  on eye lid and it swelled up   Erythromycin Base Other (See Comments)    unknown   Oxycodone  Hcl    Percocet [Oxycodone -Acetaminophen ] Other (See Comments)    Confusion change personality   Povidone-Iodine  Swelling   Soap Other (See Comments)    unknown   Statins Other (See Comments)    Muscle cramps   Adhesive [Tape] Rash   Lipitor [Atorvastatin] Rash    FAMILY HISTORY:  Family History  Problem Relation Age of Onset   Ovarian cancer Mother    Cancer - Prostate Father    Breast cancer Paternal Aunt    Multiple myeloma Paternal Aunt    Anesthesia problems Neg Hx    Hypotension Neg Hx    Malignant hyperthermia Neg Hx    Pseudochol deficiency Neg Hx     SOCIAL HISTORY: Social History   Socioeconomic History   Marital status: Divorced    Spouse name: Not on file   Number of children: 1   Years of education: Doctorate   Highest education level: Not on file  Occupational History   Occupation: Retired  Tobacco Use   Smoking status: Former    Current packs/day: 0.00    Average packs/day: 0.1 packs/day for 30.0 years (3.0 ttl pk-yrs)    Types: Cigarettes     Start date: 35    Quit date: 2005    Years since quitting: 20.0   Smokeless tobacco: Never  Vaping Use   Vaping status: Never Used  Substance and Sexual Activity   Alcohol use: Yes    Comment: wine /w dinner on occas.   Drug use: Never   Sexual activity: Never  Other Topics Concern   Not on file  Social History Narrative   Patient drinks caffeine occasionally.   Patient is right handed.   Admitted to [facility]/21/17   Divorced    Former smoker - stopped 2000    Alcohol - occasionally wine at dinner   Full code   Social Drivers of Health   Financial Resource Strain: Not on file  Food Insecurity: Not on file  Transportation Needs: Not on file  Physical Activity: Not on file  Stress: Not on file  Social Connections: Not on file    MEDICATIONS:  Current Outpatient Medications  Medication Sig Dispense Refill   acetaminophen  (TYLENOL ) 500 MG tablet Take 1,000 mg by mouth every 8 (eight) hours as needed for mild pain or moderate pain.      cetirizine  (ZYRTEC ) 10 MG tablet Take 1 tablet (10 mg total) by mouth daily as needed for allergies. (Patient taking differently: Take 10 mg by mouth 2 (two) times daily.) 30 tablet 0   Continuous Blood Gluc Sensor (DEXCOM G7 SENSOR) MISC 1 Device by Does not apply route as directed. Change sensor every 10 days 3 each 3   Evolocumab  (REPATHA  SURECLICK) 140 MG/ML SOAJ Inject 140 mg into the skin every 14 (fourteen) days. 6 mL 1   fluticasone  (FLONASE ) 50 MCG/ACT nasal spray Place 1 spray into both nostrils daily as needed for allergies. (Patient taking differently: Place 2 sprays into both nostrils daily.) 0.003 g 0   gabapentin  (NEURONTIN ) 600 MG tablet TAKE 1 TABLET(600 MG) BY MOUTH THREE TIMES DAILY (Patient taking differently: Take 600 mg by mouth 3 (three) times daily.) 270 tablet  1   HYDROcodone -acetaminophen  (NORCO/VICODIN) 5-325 MG tablet Take 1 tablet by mouth every 6 (six) hours as needed.     Insulin  Pen  Needle (BD PEN NEEDLE NANO U/F) 32G X 4 MM MISC Inject insulin  4 times a day 400 each 2   irbesartan -hydrochlorothiazide  (AVALIDE) 150-12.5 MG tablet Take 1 tablet by mouth daily. 90 tablet 2   ketoconazole (NIZORAL) 2 % cream Apply 1 application topically daily as needed (Itching).     Multiple Minerals-Vitamins (CAL-MAG-ZINC -D) TABS Take 1 tablet by mouth daily. 330 mg /130 mg/ 5 mg /5 mg     pantoprazole  (PROTONIX ) 40 MG tablet Take 1 tablet by mouth as needed.     SYMBICORT  80-4.5 MCG/ACT inhaler Inhale 2 puffs into the lungs 2 (two) times daily as needed (shortness of breath). (Patient taking differently: Inhale 2 puffs into the lungs 2 (two) times daily.) 1 Inhaler 0   dapagliflozin  propanediol (FARXIGA ) 5 MG TABS tablet Take 1 tablet (5 mg total) by mouth daily. 90 tablet 3   insulin  degludec (TRESIBA  FLEXTOUCH) 100 UNIT/ML FlexTouch Pen Inject 10 Units into the skin daily. 15 mL 3   insulin  lispro (HUMALOG  KWIKPEN) 100 UNIT/ML KwikPen INJECT 6 UNITS BEFORE BREAKFAST,8-10 UNITS AT LUNCH, 8 UNITS AT DINNER, 3-4 UNITS AS NEEDED FOR LARGE CARBS OR ICE CREAM 18 mL 2   levothyroxine  (SYNTHROID ) 50 MCG tablet TAKE 1 TABLET DAILY 90 tablet 3   Semaglutide ,0.25 or 0.5MG /DOS, (OZEMPIC , 0.25 OR 0.5 MG/DOSE,) 2 MG/3ML SOPN Inject 0.25 mg into the skin once a week. 9 mL 3   No current facility-administered medications for this visit.    PHYSICAL EXAM: Vitals:   04/22/23 1100  BP: 136/70  Pulse: 80  Resp: 20  SpO2: 96%  Weight: 168 lb 12.8 oz (76.6 kg)  Height: 5' 3 (1.6 m)   Body mass index is 29.9 kg/m.  Wt Readings from Last 3 Encounters:  04/22/23 168 lb 12.8 oz (76.6 kg)  03/11/23 167 lb (75.8 kg)  12/12/22 166 lb 6.4 oz (75.5 kg)    General: Well developed, well nourished female in no apparent distress.  HEENT: AT/[place], no external lesions.  Eyes: Conjunctiva clear and no icterus. Neck: Neck supple  Lungs: Respirations not labored Neurologic: Alert, oriented, normal  speech Extremities / Skin: Dry. No sores or rashes noted.  Psychiatric: Does not appear depressed or anxious   Diabetic Foot Exam - Simple   No data filed    LABS Reviewed Lab Results  Component Value Date   HGBA1C 6.3 (A) 04/22/2023   HGBA1C 6.4 12/10/2022   HGBA1C 5.9 (A) 08/08/2022   Lab Results  Component Value Date   FRUCTOSAMINE 283 06/09/2018   FRUCTOSAMINE 296 (H) 05/16/2017   FRUCTOSAMINE 322 (H) 10/10/2016   Lab Results  Component Value Date   CHOL 183 12/10/2022   HDL 51.50 12/10/2022   LDLCALC 113 (H) 12/10/2022   LDLDIRECT 166.0 02/09/2019   TRIG 93.0 12/10/2022   CHOLHDL 4 12/10/2022   Lab Results  Component Value Date   MICRALBCREAT 1.6 06/06/2022   MICRALBCREAT 3.8 04/10/2021   Lab Results  Component Value Date   CREATININE 1.04 12/10/2022   Lab Results  Component Value Date   GFR 49.89 (L) 12/10/2022    ASSESSMENT / PLAN  1. Controlled type 2 diabetes mellitus with complication, with long-term current use of insulin  (HCC)   2. Acquired autoimmune hypothyroidism     Diabetes Mellitus type 2, complicated by diabetic neuropathy. - Diabetic status /  severity: Controlled.  Lab Results  Component Value Date   HGBA1C 6.3 (A) 04/22/2023    - Hemoglobin A1c goal : <7%  - Medications: See below, no change.  I) continue Tresiba  10 units daily. II) continue Humalog  6 at breakfast and 8-10 lunch, 8 with supper. III) continue Ozempic  0.25 mg weekly. IV) continue Farxiga  5 mg daily.  - Home glucose testing: Dexcom G7 CGM and check as needed. - Discussed/ Gave Hypoglycemia treatment plan.  # Consult : not required at this time.   # Annual urine for microalbuminuria/ creatinine ratio, no microalbuminuria currently, continue ACE/ARB /irbesartan . Last  Lab Results  Component Value Date   MICRALBCREAT 1.6 06/06/2022    # Foot check nightly / neuropathy, continue gabapentin .  # Annual dilated diabetic eye exams.   - Diet: Make healthy  diabetic food choices - Life style / activity / exercise: Discussed.  2. Blood pressure  -  BP Readings from Last 1 Encounters:  04/22/23 136/70    - Control is in target.  - No change in current plans.  3. Lipid status / Hyperlipidemia - Last  Lab Results  Component Value Date   LDLCALC 113 (H) 12/10/2022   - Continue Repatha  140 mg every 2 weeks.  # Primary hypothyroidism -Continue levothyroxine /Synthroid  50 mcg daily. -Thyroid  function test prior to follow-up visit.  Diagnoses and all orders for this visit:  Controlled type 2 diabetes mellitus with complication, with long-term current use of insulin  (HCC) -     POCT glycosylated hemoglobin (Hb A1C) -     dapagliflozin  propanediol (FARXIGA ) 5 MG TABS tablet; Take 1 tablet (5 mg total) by mouth daily. -     insulin  degludec (TRESIBA  FLEXTOUCH) 100 UNIT/ML FlexTouch Pen; Inject 10 Units into the skin daily. -     insulin  lispro (HUMALOG  KWIKPEN) 100 UNIT/ML KwikPen; INJECT 6 UNITS BEFORE BREAKFAST,8-10 UNITS AT LUNCH, 8 UNITS AT DINNER, 3-4 UNITS AS NEEDED FOR LARGE CARBS OR ICE CREAM -     BASIC METABOLIC PANEL WITH GFR -     Microalbumin / creatinine urine ratio -     Semaglutide ,0.25 or 0.5MG /DOS, (OZEMPIC , 0.25 OR 0.5 MG/DOSE,) 2 MG/3ML SOPN; Inject 0.25 mg into the skin once a week.  Acquired autoimmune hypothyroidism -     T4, free -     TSH -     levothyroxine  (SYNTHROID ) 50 MCG tablet; TAKE 1 TABLET DAILY    DISPOSITION Follow up in clinic in 3 months suggested, lab visit prior to follow-up.   All questions answered and patient verbalized understanding of the plan.  Janah Mcculloh, MD [facility] Endocrinology [facility] [address] [place], [address] [place], [place] [address] Phone # [phone]  At least part of this note was generated using voice recognition software. Inadvertent word errors may have occurred, which were not recognized during the proofreading process.

## 2023-05-03 ENCOUNTER — Encounter: Payer: Self-pay | Admitting: Endocrinology

## 2023-05-05 MED ORDER — TRESIBA FLEXTOUCH 100 UNIT/ML ~~LOC~~ SOPN: 10.0000 [IU] | PEN_INJECTOR | Freq: Every day | SUBCUTANEOUS | 4 refills | Status: DC

## 2023-05-05 MED ORDER — INSULIN GLARGINE 100 UNIT/ML SOLOSTAR PEN: 10.0000 [IU] | PEN_INJECTOR | Freq: Every day | SUBCUTANEOUS | 4 refills | Status: DC

## 2023-05-05 NOTE — Telephone Encounter (Signed)
 When I was trying to order it gave the prompted message that Tresiba  is no longer covered, and gave the alternative for Lantus , and I sent prescription to the express prescription mail order pharmacy and also I will reorder Tresiba  FlexPen as well.  Whichever is covered you can take Lantus  or Tresiba .  You may double check with your insurance as well.  Rosely Fernandez, MD [facility] Endocrinology [facility] [address] [place], [address] [place], [place] [address] Phone # [phone]

## 2023-05-05 NOTE — Telephone Encounter (Signed)
 It looks like [name] is no longer covered by her medical insurance.  I changed insulin to Lantus pen 10 units daily.

## 2023-05-05 NOTE — Telephone Encounter (Signed)
 Called and spoke w/  [facility] , pt did receive the vial and not the pens. Did you want the patient to have the vials and not the pens . Please advise

## 2023-05-21 ENCOUNTER — Encounter: Payer: Self-pay | Admitting: Endocrinology

## 2023-05-21 ENCOUNTER — Other Ambulatory Visit: Payer: Self-pay

## 2023-05-22 ENCOUNTER — Other Ambulatory Visit: Payer: Self-pay

## 2023-05-22 DIAGNOSIS — Z794 Long term (current) use of insulin: Secondary | ICD-10-CM

## 2023-05-22 MED ORDER — FREESTYLE LIBRE 3 PLUS SENSOR MISC: 1.0000 | 3 refills | Status: DC

## 2023-05-26 ENCOUNTER — Other Ambulatory Visit: Payer: Self-pay

## 2023-05-26 MED ORDER — FREESTYLE LIBRE 3 READER DEVI: 0 refills | Status: DC

## 2023-05-27 ENCOUNTER — Other Ambulatory Visit: Payer: Self-pay

## 2023-06-04 ENCOUNTER — Other Ambulatory Visit: Payer: Self-pay

## 2023-06-04 DIAGNOSIS — Z794 Long term (current) use of insulin: Secondary | ICD-10-CM

## 2023-06-04 MED ORDER — DEXCOM G7 SENSOR MISC
1.0000 | 3 refills | Status: AC
Start: 2023-06-04 — End: ?

## 2023-06-12 ENCOUNTER — Ambulatory Visit: Payer: Self-pay | Admitting: Podiatry

## 2023-06-26 ENCOUNTER — Ambulatory Visit (INDEPENDENT_AMBULATORY_CARE_PROVIDER_SITE_OTHER): Payer: Self-pay | Admitting: Podiatry

## 2023-06-26 ENCOUNTER — Encounter: Payer: Self-pay | Admitting: Podiatry

## 2023-06-26 VITALS — Ht 63.0 in | Wt 168.0 lb

## 2023-06-26 DIAGNOSIS — M79675 Pain in left toe(s): Secondary | ICD-10-CM | POA: Diagnosis not present

## 2023-06-26 DIAGNOSIS — L608 Other nail disorders: Secondary | ICD-10-CM

## 2023-06-26 DIAGNOSIS — M79674 Pain in right toe(s): Secondary | ICD-10-CM

## 2023-06-26 DIAGNOSIS — B351 Tinea unguium: Secondary | ICD-10-CM

## 2023-06-26 DIAGNOSIS — E1142 Type 2 diabetes mellitus with diabetic polyneuropathy: Secondary | ICD-10-CM

## 2023-06-26 NOTE — Progress Notes (Signed)
This patient returns to my office for at risk foot care.  This patient requires this care by a professional since this patient will be at risk due to having diabetes.  This patient is unable to cut nails herself since the patient cannot reach her nails.These nails are painful walking and wearing shoes.  This patient presents for at risk foot care today.  General Appearance  Alert, conversant and in no acute stress.  Vascular  Dorsalis pedis and posterior tibial  pulses are palpable  bilaterally.  Capillary return is within normal limits  bilaterally. Temperature is within normal limits  bilaterally.  Neurologic  Senn-Weinstein monofilament wire test within normal limits  bilaterally. Muscle power within normal limits bilaterally.  Nails Thick disfigured discolored nails with subungual debris  from hallux to fifth toes bilaterally. No evidence of bacterial infection or drainage bilaterally.  Pincer nails  B/L.  Orthopedic  No limitations of motion  feet .  No crepitus or effusions noted.  No bony pathology or digital deformities noted.  Skin  normotropic skin with no porokeratosis noted bilaterally.  No signs of infections or ulcers noted.     Onychomycosis  Pain in right toes  Pain in left toes  Consent was obtained for treatment procedures.   Mechanical debridement of nails 1-5  bilaterally performed with a nail nipper.  Filed with dremel without incident.    Return office visit   3 months                   Told patient to return for periodic foot care and evaluation due to potential at risk complications.   Gardiner Barefoot DPM

## 2023-07-17 ENCOUNTER — Other Ambulatory Visit: Payer: Medicare (Managed Care)

## 2023-07-18 ENCOUNTER — Encounter: Payer: Self-pay | Admitting: Endocrinology

## 2023-07-18 LAB — BASIC METABOLIC PANEL WITH GFR
BUN/Creatinine Ratio: 18 (calc) (ref 6–22)
BUN: 18 mg/dL (ref 7–25)
CO2: 29 mmol/L (ref 20–32)
Calcium: 9.8 mg/dL (ref 8.6–10.4)
Chloride: 103 mmol/L (ref 98–110)
Creat: 1.02 mg/dL — ABNORMAL HIGH (ref 0.60–0.95)
Glucose, Bld: 109 mg/dL — ABNORMAL HIGH (ref 65–99)
Potassium: 3.8 mmol/L (ref 3.5–5.3)
Sodium: 142 mmol/L (ref 135–146)
eGFR: 55 mL/min/{1.73_m2} — ABNORMAL LOW (ref >=60)

## 2023-07-18 LAB — LIPID PANEL
Cholesterol: 192 mg/dL (ref <200)
HDL: 53 mg/dL (ref >=50)
LDL Cholesterol (Calc): 115 mg/dL — ABNORMAL HIGH
Non-HDL Cholesterol (Calc): 139 mg/dL — ABNORMAL HIGH (ref <130)
Total CHOL/HDL Ratio: 3.6 (calc) (ref <5.0)
Triglycerides: 129 mg/dL (ref <150)

## 2023-07-18 LAB — MICROALBUMIN / CREATININE URINE RATIO
Creatinine, Urine: 65 mg/dL (ref 20–275)
Microalb Creat Ratio: 12 mg/g{creat} (ref <30)
Microalb, Ur: 0.8 mg/dL

## 2023-07-18 LAB — TSH: TSH: 4.58 m[IU]/L — ABNORMAL HIGH (ref 0.40–4.50)

## 2023-07-18 LAB — T4, FREE: Free T4: 1.2 ng/dL (ref 0.8–1.8)

## 2023-07-22 ENCOUNTER — Other Ambulatory Visit: Payer: Self-pay

## 2023-07-22 ENCOUNTER — Encounter: Payer: Self-pay | Admitting: Endocrinology

## 2023-07-22 ENCOUNTER — Ambulatory Visit (INDEPENDENT_AMBULATORY_CARE_PROVIDER_SITE_OTHER): Payer: Medicare (Managed Care) | Admitting: Endocrinology

## 2023-07-22 VITALS — BP 124/80 | HR 76 | Resp 14 | Ht 63.0 in | Wt 167.8 lb

## 2023-07-22 DIAGNOSIS — E782 Mixed hyperlipidemia: Secondary | ICD-10-CM | POA: Diagnosis not present

## 2023-07-22 DIAGNOSIS — E119 Type 2 diabetes mellitus without complications: Secondary | ICD-10-CM

## 2023-07-22 DIAGNOSIS — E063 Autoimmune thyroiditis: Secondary | ICD-10-CM

## 2023-07-22 DIAGNOSIS — Z794 Long term (current) use of insulin: Secondary | ICD-10-CM | POA: Diagnosis not present

## 2023-07-22 DIAGNOSIS — E118 Type 2 diabetes mellitus with unspecified complications: Secondary | ICD-10-CM

## 2023-07-22 LAB — POCT GLYCOSYLATED HEMOGLOBIN (HGB A1C): Hemoglobin A1C: 6.1 % — AB (ref 4.0–5.6)

## 2023-07-22 MED ORDER — SEMAGLUTIDE (1 MG/DOSE) 4 MG/3ML ~~LOC~~ SOPN: 1.0000 mg | PEN_INJECTOR | SUBCUTANEOUS | 3 refills | Status: DC

## 2023-07-22 MED ORDER — DAPAGLIFLOZIN PROPANEDIOL 5 MG PO TABS
5.0000 mg | ORAL_TABLET | Freq: Every day | ORAL | 3 refills | Status: DC
Start: 2023-07-22 — End: 2023-11-10

## 2023-07-22 NOTE — Progress Notes (Signed)
 Outpatient Endocrinology Note Julia Yomira Flitton, MD   Patient's Name: [name]    DOB: 10-13-1940    MRN: [id number]                                                    REASON OF VISIT: Follow up for type 2 diabetes mellitus  PCP: Georgann Housekeeper, MD  HISTORY OF PRESENT ILLNESS:   [name] is a 83 y.o. old female with past medical history listed below, is here for follow up for type 2 diabetes mellitus / hypothyroidism.   Pertinent Diabetes History: Patient was previously seen by Dr. Lucianne Muss and was last time seen in August 2024.  Patient was diagnosed with type 2 diabetes mellitus in 1998.  Initially treated with oral antidiabetic medication and insulin therapy was added later.  She has been on insulin therapy for several years.  Chronic Diabetes Complications : Retinopathy: no. Last ophthalmology exam was done on 02/2022, following with ophthalmology regularly.  Nephropathy: CKD IIIa, on ACE/ARB / irbesartan Peripheral neuropathy: yes, on gabapentin. Coronary artery disease: no Stroke: no  Relevant comorbidities and cardiovascular risk factors: Obesity: no Body mass index is 29.72 kg/m.  Hypertension: Yes  Hyperlipidemia : Yes, on statin   The lipid abnormality consists of elevated LDL persistently. Did not tolerate Crestor or lovastatin because of muscle cramps. She stopped Zetia presumably because she was having muscle cramps.   She was started on Repatha in 2021  Current / Home Diabetic regimen includes:  Non-insulin hypoglycemic drugs : Farxiga 5 mg daily, Ozempic 0.5 mg weekly.  The insulin regimen : Humalog 6 at breakfast and 8-10 lunch, 6-8 with supper, Tresiba 10 units daily in the morning.    Prior diabetic medications: Byetta, Glumetza, Amaryl, Victoza, Onglyza, metformin, Lantus in the past. Ozempic 0.5 was changed to 0.25 mg due to nausea and change in taste.  Glycemic data:    CONTINUOUS GLUCOSE MONITORING SYSTEM (CGMS) INTERPRETATION: At today's  visit, we reviewed CGM downloads. The full report is scanned in the media. Reviewing the CGM trends, blood glucose are as follows:  Dexcom G7 CGM-she forgot to bring Dexcom controlled and not able to download and review glucose data in the clinic today.  Hypoglycemia: Patient has no hypoglycemic episodes. Patient has hypoglycemia awareness.  Factors modifying glucose control: 1.  Diabetic diet assessment: 3 meals a day.  2.  Staying active or exercising: no formal exercise.  Trying to walk.  3.  Medication compliance: compliant all of the time.  # Primary hypothyroidism : -Diagnosed with primary hypothyroidism with mildly elevated TSH in 2016.  Initially started on levothyroxine 25 mcg daily and increased to 50 mcg daily.  Interval history  Recent 20 as reviewed above.  Hemoglobin A1c today 6.1%.  She forgot to bring Dexcom controller and not able to download and review glucose data in the clinic today.  Patient reports occasionally she is getting low blood sugar up to 58 in the early morning requiring to correct with eating.  She has been taking Ozempic 0.5 mg weekly, denies any GI issues and tolerating well.  Recent laboratory results reviewed.  Thyroid function test with mildly elevated TSH.  She has been taking levothyroxine 50 mcg daily however she also takes with vitamin C including magnesium, zinc and calcium at the same time with taking levothyroxine.  She is overall feeling fair energy, no hypo and hyperthyroid symptoms.  LDL mildly high 115 is acceptable.  Renal function is stable.  Urine microalbumin creatinine ratio normal.  No other complaints today.  REVIEW OF SYSTEMS As per history of present illness.   PAST MEDICAL HISTORY: Past Medical History:  Diagnosis Date   Acute blood loss anemia    Acute encephalopathy 07/15/2016   AKI (acute kidney injury) (HCC)    Anemia    has sickle cell trait   Anxiety    Arthritis    Asthma    has used inhaler in past for  asthmatic bronchitis, last time- early 2012   Bilateral primary osteoarthritis of knee 03/26/2016   Complication of anesthesia    wakes up shaking   Diabetes mellitus    Diabetes mellitus with neuropathy (HCC) 10/29/2012   Diverticulitis    Dyslipidemia    Dyspnea    with exertion   Encephalopathy 06/2016   due to medications after surgery   Fibromyalgia    GERD (gastroesophageal reflux disease)    occas. use of  Prilosec   Heart murmur    sees Dr. Shana Chute, last seen- early 2012   Herniated nucleus pulposus, L2-3 06/25/2016   History of back surgery    Hypertension    02/2010- stress test /w PCP   Hypothyroidism    Loose bowel movements 12/2016   Lumbar radiculopathy 06/27/2016   Lumbar stenosis with neurogenic claudication 03/17/2017   Memory disorder 02/16/2016   Myoclonic jerking    Neuromuscular disorder (HCC)    lumbar radiculopathy, lumbago   Nocturnal leg cramps 09/27/2014   Osteoporosis 03/10/2014   Pneumonia    Sickle cell trait (HCC)    Sleep apnea    borderline sleep apnea, states she no longer uses, early 2012- stopped using    Spinal stenosis of lumbar region with radiculopathy 02/28/2014   Spondylolisthesis of lumbar region 03/19/2011   Type II or unspecified type diabetes mellitus without mention of complication, uncontrolled 07/08/2013    PAST SURGICAL HISTORY: Past Surgical History:  Procedure Laterality Date   ABDOMINAL HYSTERECTOMY     adb.cyst     ovarian cyst   BACK SURGERY     2012, 2015 (3 total)   BACK SURGERY  2018   02/24/2017   COLONOSCOPY     EYE SURGERY     macular degeneration treatment - injections   FEMUR IM NAIL Left 06/13/2018   Procedure: INTRAMEDULLARY (IM) NAIL FEMORAL;  Surgeon: Kathryne Hitch, MD;  Location: [facility];  Service: Orthopedics;  Laterality: Left;   OVARIAN CYST SURGERY     POSTERIOR LUMBAR FUSION 4 LEVEL N/A 03/17/2017   Procedure: Decompression of Lumbar One-Two with Thoracic Ten to Lumbar Two Fusion;  Surgeon:  Barnett Abu, MD;  Location: [facility];  Service: Neurosurgery;  Laterality: N/A;  Decompression of L1-2 with T10 to L2 Fusion   TOTAL KNEE ARTHROPLASTY Right 08/13/2019   Procedure: RIGHT TOTAL KNEE ARTHROPLASTY;  Surgeon: Kathryne Hitch, MD;  Location: [facility];  Service: Orthopedics;  Laterality: Right;   TOTAL KNEE REVISION Right 07/05/2020   Procedure: TOTAL KNEE REVISION;  Surgeon: Ollen Gross, MD;  Location: [facility];  Service: Orthopedics;  Laterality: Right;     ALLERGIES: Allergies  Allergen Reactions   Cymbalta [Duloxetine Hcl] Diarrhea and Other (See Comments)    Dizziness, headache, irritability   Amlodipine Besylate Swelling   Baclofen     jerks    Betadine [Povidone Iodine]  Swelling and Other (See Comments)    SWELLING REACTION DESCRIPTION/SEVERITY UNSPECIFIED  Reaction to betadine on eye lid and it swelled up   Erythromycin Base Other (See Comments)    unknown   Oxycodone Hcl    Percocet [Oxycodone-Acetaminophen] Other (See Comments)    Confusion change personality   Povidone-Iodine Swelling   Soap Other (See Comments)    unknown   Statins Other (See Comments)    Muscle cramps   Adhesive [Tape] Rash   Lipitor [Atorvastatin] Rash    FAMILY HISTORY:  Family History  Problem Relation Age of Onset   Ovarian cancer Mother    Cancer - Prostate Father    Breast cancer Paternal Aunt    Multiple myeloma Paternal Aunt    Anesthesia problems Neg Hx    Hypotension Neg Hx    Malignant hyperthermia Neg Hx    Pseudochol deficiency Neg Hx     SOCIAL HISTORY: Social History   Socioeconomic History   Marital status: Divorced    Spouse name: Not on file   Number of children: 1   Years of education: Doctorate   Highest education level: Not on file  Occupational History   Occupation: Retired  Tobacco Use   Smoking status: Former    Current packs/day: 0.00    Average packs/day: 0.1 packs/day for 30.0 years (3.0 ttl pk-yrs)    Types: Cigarettes     Start date: 58    Quit date: 2005    Years since quitting: 20.2   Smokeless tobacco: Never  Vaping Use   Vaping status: Never Used  Substance and Sexual Activity   Alcohol use: Yes    Comment: wine /w dinner on occas.   Drug use: Never   Sexual activity: Never  Other Topics Concern   Not on file  Social History Narrative   Patient drinks caffeine occasionally.   Patient is right handed.   Admitted to [facility]/21/17   Divorced    Former smoker - stopped 2000    Alcohol - occasionally wine at dinner   Full code   Social Drivers of Health   Financial Resource Strain: Not on file  Food Insecurity: Not on file  Transportation Needs: Not on file  Physical Activity: Not on file  Stress: Not on file  Social Connections: Not on file    MEDICATIONS:  Current Outpatient Medications  Medication Sig Dispense Refill   acetaminophen (TYLENOL) 500 MG tablet Take 1,000 mg by mouth every 8 (eight) hours as needed for mild pain or moderate pain.      amitriptyline (ELAVIL) 25 MG tablet Take 25 mg by mouth at bedtime.     cetirizine (ZYRTEC) 10 MG tablet Take 1 tablet (10 mg total) by mouth daily as needed for allergies. (Patient taking differently: Take 10 mg by mouth 2 (two) times daily.) 30 tablet 0   Evolocumab (REPATHA SURECLICK) 140 MG/ML SOAJ Inject 140 mg into the skin every 14 (fourteen) days. 6 mL 1   fluticasone (FLONASE) 50 MCG/ACT nasal spray Place 1 spray into both nostrils daily as needed for allergies. (Patient taking differently: Place 2 sprays into both nostrils daily.) 0.003 g 0   gabapentin (NEURONTIN) 600 MG tablet TAKE 1 TABLET(600 MG) BY MOUTH THREE TIMES DAILY (Patient taking differently: Take 600 mg by mouth 3 (three) times daily.) 270 tablet 1   HYDROcodone-acetaminophen (NORCO/VICODIN) 5-325 MG tablet Take 1 tablet by mouth every 6 (six) hours as needed.  insulin glargine (LANTUS) 100 UNIT/ML Solostar Pen Inject 10 Units into the skin daily.  15 mL 4   insulin lispro (HUMALOG KWIKPEN) 100 UNIT/ML KwikPen INJECT 6 UNITS BEFORE BREAKFAST,8-10 UNITS AT LUNCH, 8 UNITS AT DINNER, 3-4 UNITS AS NEEDED FOR LARGE CARBS OR ICE CREAM 18 mL 2   Insulin Pen Needle (BD PEN NEEDLE NANO U/F) 32G X 4 MM MISC Inject insulin 4 times a day 400 each 2   irbesartan-hydrochlorothiazide (AVALIDE) 150-12.5 MG tablet Take 1 tablet by mouth daily. 90 tablet 2   ketoconazole (NIZORAL) 2 % cream Apply 1 application topically daily as needed (Itching).     levothyroxine (SYNTHROID) 50 MCG tablet TAKE 1 TABLET DAILY 90 tablet 3   Multiple Minerals-Vitamins (CAL-MAG-ZINC-D) TABS Take 1 tablet by mouth daily. 330 mg /130 mg/ 5 mg /5 mg     pantoprazole (PROTONIX) 40 MG tablet Take 1 tablet by mouth as needed.     Semaglutide, 1 MG/DOSE, 4 MG/3ML SOPN Inject 1 mg as directed once a week. 9 mL 3   SYMBICORT 80-4.5 MCG/ACT inhaler Inhale 2 puffs into the lungs 2 (two) times daily as needed (shortness of breath). (Patient taking differently: Inhale 2 puffs into the lungs 2 (two) times daily.) 1 Inhaler 0   UNABLE TO FIND Med Name: Omega XL x2     Continuous Glucose Sensor (DEXCOM G7 SENSOR) MISC 1 Device by Does not apply route as directed. Change sensor every 10 days 3 each 3   dapagliflozin propanediol (FARXIGA) 5 MG TABS tablet Take 1 tablet (5 mg total) by mouth daily. 90 tablet 3   No current facility-administered medications for this visit.    PHYSICAL EXAM: Vitals:   07/22/23 1100  BP: 124/80  Pulse: 76  Resp: 14  SpO2: 95%  Weight: 167 lb 12.8 oz (76.1 kg)  Height: 5\' 3"  (1.6 m)    Body mass index is 29.72 kg/m.  Wt Readings from Last 3 Encounters:  07/22/23 167 lb 12.8 oz (76.1 kg)  06/26/23 168 lb (76.2 kg)  04/22/23 168 lb 12.8 oz (76.6 kg)    General: Well developed, well nourished female in no apparent distress.  HEENT: AT/[place], no external lesions.  Eyes: Conjunctiva clear and no icterus. Neck: Neck supple  Lungs: Respirations not  labored Neurologic: Alert, oriented, normal speech Extremities / Skin: Dry. No sores or rashes noted.  Psychiatric: Does not appear depressed or anxious   Diabetic Foot Exam - Simple   No data filed    LABS Reviewed Lab Results  Component Value Date   HGBA1C 6.1 (A) 07/22/2023   HGBA1C 6.3 (A) 04/22/2023   HGBA1C 6.4 12/10/2022   Lab Results  Component Value Date   FRUCTOSAMINE 283 06/09/2018   FRUCTOSAMINE 296 (H) 05/16/2017   FRUCTOSAMINE 322 (H) 10/10/2016   Lab Results  Component Value Date   CHOL 192 07/17/2023   HDL 53 07/17/2023   LDLCALC 115 (H) 07/17/2023   LDLDIRECT 166.0 02/09/2019   TRIG 129 07/17/2023   CHOLHDL 3.6 07/17/2023   Lab Results  Component Value Date   MICRALBCREAT 12 07/17/2023   MICRALBCREAT 1.6 06/06/2022   Lab Results  Component Value Date   CREATININE 1.02 (H) 07/17/2023   Lab Results  Component Value Date   GFR 49.89 (L) 12/10/2022    ASSESSMENT / PLAN  1. Insulin dependent type 2 diabetes mellitus (HCC)   2. Acquired autoimmune hypothyroidism   3. Mixed hyperlipidemia      Diabetes Mellitus type  2, complicated by diabetic neuropathy. - Diabetic status / severity: Controlled.  Lab Results  Component Value Date   HGBA1C 6.1 (A) 07/22/2023    - Hemoglobin A1c goal : <7%  - Medications: See below.  Decrease Lantus / Evaristo Bury to 8 units daily.  Reported occasional hypoglycemia in the morning.  Continue current dose of lispro/Humalog 6 units with breakfast, 8 to 10 units with dinner/lunch and 6 to 8 units with supper.  Continue Farxiga 5 mg daily.  Increase Ozempic to 1 mg weekly.  Once you start taking Ozempic 1 mg weekly decrease insulin by 2 units for each time for Lantus and lispro/Humalog.   - Home glucose testing: Dexcom G7 CGM and check as needed.  Patient will set up phone app for Dexcom and asked to call our clinic to have remote download of Dexcom data.  Will review once available. - Discussed/ Gave  Hypoglycemia treatment plan.  # Consult : not required at this time.   # Annual urine for microalbuminuria/ creatinine ratio, no microalbuminuria currently, continue ACE/ARB /irbesartan. Last  Lab Results  Component Value Date   MICRALBCREAT 12 07/17/2023    # Foot check nightly / neuropathy, continue gabapentin.  # Annual dilated diabetic eye exams.   - Diet: Make healthy diabetic food choices - Life style / activity / exercise: Discussed.  2. Blood pressure  -  BP Readings from Last 1 Encounters:  07/22/23 124/80    - Control is in target.  - No change in current plans.  3. Lipid status / Hyperlipidemia - Last  Lab Results  Component Value Date   LDLCALC 115 (H) 07/17/2023   - Continue Repatha 140 mg every 2 weeks.  LDL acceptable.  Advised to limit fatty food.  # Primary hypothyroidism -Continue levothyroxine/Synthroid 50 mcg daily. -Thyroid function test with TSH of 4.58, with upper normal rate of 4.50.  Taking into account of is this is appropriate.  No need to increase the dose of levothyroxine. -Patient is currently taking levothyroxine with vitamins including calcium, magnesium and zinc, advised to take these vitamins in the afternoon or evening not together with levothyroxine. -Will check thyroid function test at next follow-up visit.   Diagnoses and all orders for this visit:  Insulin dependent type 2 diabetes mellitus (HCC) -     POCT glycosylated hemoglobin (Hb A1C) -     Semaglutide, 1 MG/DOSE, 4 MG/3ML SOPN; Inject 1 mg as directed once a week. -     Basic Metabolic Panel Without GFR -     Hemoglobin A1c  Acquired autoimmune hypothyroidism -     T4, free -     TSH  Mixed hyperlipidemia   DISPOSITION Follow up in clinic in 3 months suggested, lab visit prior to follow-up as ordered.   All questions answered and patient verbalized understanding of the plan.  Julia Jazlyn Tippens, MD [facility] [address] [place],  [address] [place], [place] [address] Phone # [phone]  At least part of this note was generated using voice recognition software. Inadvertent word errors may have occurred, which were not recognized during the proofreading process.

## 2023-07-22 NOTE — Patient Instructions (Addendum)
 Diabetes regimen  Decrease Lantus / Evaristo Bury to 8 units daily.  Continue current dose of lispro/Humalog 6 units with breakfast, 8 to 10 units with dinner/lunch and 6 to 8 units with supper.  Continue Farxiga 5 mg daily.  Increase Ozempic to 1 mg weekly.  Once you start taking Ozempic 1 mg weekly decrease insulin by 2 units for each time for Lantus and lispro/Humalog.  Continue current dose of levothyroxine 50 mcg daily.

## 2023-07-22 NOTE — Telephone Encounter (Signed)
 Requested Prescriptions   Pending Prescriptions Disp Refills   dapagliflozin propanediol (FARXIGA) 5 MG TABS tablet 90 tablet 3    Sig: Take 1 tablet (5 mg total) by mouth daily.   Pt needs PA done for farxiga 5 mg

## 2023-08-05 ENCOUNTER — Ambulatory Visit: Payer: Self-pay | Admitting: Cardiology

## 2023-08-13 ENCOUNTER — Encounter: Payer: Self-pay | Admitting: Endocrinology

## 2023-08-13 ENCOUNTER — Other Ambulatory Visit: Payer: Self-pay

## 2023-08-13 DIAGNOSIS — E063 Autoimmune thyroiditis: Secondary | ICD-10-CM

## 2023-08-13 MED ORDER — LEVOTHYROXINE SODIUM 50 MCG PO TABS: ORAL_TABLET | ORAL | 3 refills | Status: AC

## 2023-09-11 ENCOUNTER — Encounter: Payer: Self-pay | Admitting: Endocrinology

## 2023-09-11 ENCOUNTER — Other Ambulatory Visit: Payer: Self-pay

## 2023-09-11 DIAGNOSIS — E782 Mixed hyperlipidemia: Secondary | ICD-10-CM

## 2023-09-11 MED ORDER — REPATHA SURECLICK 140 MG/ML ~~LOC~~ SOAJ: 140.0000 mg | SUBCUTANEOUS | 1 refills | Status: DC

## 2023-09-19 ENCOUNTER — Telehealth: Payer: Self-pay

## 2023-09-19 ENCOUNTER — Other Ambulatory Visit (HOSPITAL_COMMUNITY): Payer: Self-pay

## 2023-09-19 NOTE — Telephone Encounter (Signed)
 Pharmacy Patient Advocate Encounter   Received notification from RX Request Messages that prior authorization for Repatha  is required/requested.   Insurance verification completed.   The patient is insured through [employer] and [employer].   Per test claim: Looks like an authorization through [employer] that was started by the patient was approved with an expiration date of 10/17/2024. Copay is $0  [employer] says "Product/service not covered. For part B coverage only).   No further action should be required.

## 2023-09-25 ENCOUNTER — Ambulatory Visit (INDEPENDENT_AMBULATORY_CARE_PROVIDER_SITE_OTHER): Admitting: Podiatry

## 2023-09-25 ENCOUNTER — Encounter: Payer: Self-pay | Admitting: Podiatry

## 2023-09-25 DIAGNOSIS — M79675 Pain in left toe(s): Secondary | ICD-10-CM | POA: Diagnosis not present

## 2023-09-25 DIAGNOSIS — L608 Other nail disorders: Secondary | ICD-10-CM | POA: Diagnosis not present

## 2023-09-25 DIAGNOSIS — B351 Tinea unguium: Secondary | ICD-10-CM | POA: Diagnosis not present

## 2023-09-25 DIAGNOSIS — M79674 Pain in right toe(s): Secondary | ICD-10-CM

## 2023-09-25 DIAGNOSIS — E1142 Type 2 diabetes mellitus with diabetic polyneuropathy: Secondary | ICD-10-CM | POA: Diagnosis not present

## 2023-09-25 NOTE — Progress Notes (Signed)
This patient returns to my office for at risk foot care.  This patient requires this care by a professional since this patient will be at risk due to having diabetes.  This patient is unable to cut nails herself since the patient cannot reach her nails.These nails are painful walking and wearing shoes.  This patient presents for at risk foot care today.  General Appearance  Alert, conversant and in no acute stress.  Vascular  Dorsalis pedis and posterior tibial  pulses are palpable  bilaterally.  Capillary return is within normal limits  bilaterally. Temperature is within normal limits  bilaterally.  Neurologic  Senn-Weinstein monofilament wire test within normal limits  bilaterally. Muscle power within normal limits bilaterally.  Nails Thick disfigured discolored nails with subungual debris  from hallux to fifth toes bilaterally. No evidence of bacterial infection or drainage bilaterally.  Pincer nails  B/L.  Orthopedic  No limitations of motion  feet .  No crepitus or effusions noted.  No bony pathology or digital deformities noted.  Skin  normotropic skin with no porokeratosis noted bilaterally.  No signs of infections or ulcers noted.     Onychomycosis  Pain in right toes  Pain in left toes  Consent was obtained for treatment procedures.   Mechanical debridement of nails 1-5  bilaterally performed with a nail nipper.  Filed with dremel without incident.    Return office visit   3 months                   Told patient to return for periodic foot care and evaluation due to potential at risk complications.   Gardiner Barefoot DPM

## 2023-09-27 ENCOUNTER — Encounter: Payer: Self-pay | Admitting: Endocrinology

## 2023-09-29 ENCOUNTER — Other Ambulatory Visit: Payer: Self-pay

## 2023-09-29 DIAGNOSIS — E782 Mixed hyperlipidemia: Secondary | ICD-10-CM

## 2023-09-29 MED ORDER — REPATHA SURECLICK 140 MG/ML ~~LOC~~ SOAJ: 140.0000 mg | SUBCUTANEOUS | 1 refills | Status: DC

## 2023-10-14 ENCOUNTER — Other Ambulatory Visit

## 2023-10-15 LAB — BASIC METABOLIC PANEL WITHOUT GFR
BUN: 18 mg/dL (ref 7–25)
CO2: 29 mmol/L (ref 20–32)
Calcium: 9.5 mg/dL (ref 8.6–10.4)
Chloride: 101 mmol/L (ref 98–110)
Creat: 0.93 mg/dL (ref 0.60–0.95)
Glucose, Bld: 120 mg/dL — ABNORMAL HIGH (ref 65–99)
Potassium: 3.7 mmol/L (ref 3.5–5.3)
Sodium: 140 mmol/L (ref 135–146)

## 2023-10-15 LAB — HEMOGLOBIN A1C
Hgb A1c MFr Bld: 6.4 % — ABNORMAL HIGH (ref <5.7)
Mean Plasma Glucose: 137 mg/dL
eAG (mmol/L): 7.6 mmol/L

## 2023-10-15 LAB — TSH: TSH: 3.34 m[IU]/L (ref 0.40–4.50)

## 2023-10-15 LAB — T4, FREE: Free T4: 1.3 ng/dL (ref 0.8–1.8)

## 2023-10-16 ENCOUNTER — Ambulatory Visit (INDEPENDENT_AMBULATORY_CARE_PROVIDER_SITE_OTHER): Admitting: Endocrinology

## 2023-10-16 ENCOUNTER — Encounter: Payer: Self-pay | Admitting: Endocrinology

## 2023-10-16 VITALS — BP 110/60 | HR 83 | Resp 20 | Ht 63.0 in | Wt 169.4 lb

## 2023-10-16 DIAGNOSIS — E118 Type 2 diabetes mellitus with unspecified complications: Secondary | ICD-10-CM | POA: Diagnosis not present

## 2023-10-16 DIAGNOSIS — E063 Autoimmune thyroiditis: Secondary | ICD-10-CM

## 2023-10-16 DIAGNOSIS — Z794 Long term (current) use of insulin: Secondary | ICD-10-CM

## 2023-10-16 MED ORDER — OZEMPIC (0.25 OR 0.5 MG/DOSE) 2 MG/1.5ML ~~LOC~~ SOPN: 0.5000 mg | PEN_INJECTOR | SUBCUTANEOUS | 3 refills | Status: AC

## 2023-10-16 NOTE — Progress Notes (Signed)
 Outpatient Endocrinology Note [place] Lizann Edelman, MD   Patient's Name: [name]    DOB: [birth year]    MRN: [id number]                                                    REASON OF VISIT: Follow up for type 2 diabetes mellitus  PCP: Ransom Other, MD  HISTORY OF PRESENT ILLNESS:   [name] is a 83 y.o. old female with past medical history listed below, is here for follow up for type 2 diabetes mellitus / hypothyroidism.   Pertinent Diabetes History: Patient was previously seen by Dr. Von and was last time seen in August 2024.  Patient was diagnosed with type 2 diabetes mellitus in 1998.  Initially treated with oral antidiabetic medication and insulin  therapy was added later.  She has been on insulin  therapy for several years.  Chronic Diabetes Complications : Retinopathy: no. Last ophthalmology exam was done on 02/2022, following with ophthalmology regularly.  Nephropathy: CKD IIIa, on ACE/ARB / irbesartan  Peripheral neuropathy: yes, on gabapentin . Coronary artery disease: no Stroke: no  Relevant comorbidities and cardiovascular risk factors: Obesity: no Body mass index is 30.01 kg/m.  Hypertension: Yes  Hyperlipidemia : Yes, on statin   The lipid abnormality consists of elevated LDL persistently. Did not tolerate Crestor  or lovastatin because of muscle cramps. She stopped Zetia  presumably because she was having muscle cramps.   She was started on Repatha  in 2021  Current / Home Diabetic regimen includes:  Non-insulin  hypoglycemic drugs : Farxiga  5 mg daily, Ozempic  0.5 mg weekly.  The insulin  regimen : Humalog  6 at breakfast and 8-10 lunch, 6-8 with supper, Tresiba  8 units daily in the morning.    Prior diabetic medications: Byetta, Glumetza, Amaryl , Victoza, Onglyza, metformin, Lantus  in the past. Ozempic  0.5 was changed to 0.25 mg due to nausea and change in taste.  Glycemic data:    CONTINUOUS GLUCOSE MONITORING SYSTEM (CGMS) INTERPRETATION: At today's  visit, we reviewed CGM downloads. The full report is scanned in the media. Reviewing the CGM trends, blood glucose are as follows:  Dexcom G7 CGM-  Sensor Download (Sensor download was reviewed and summarized below.) Dates: June 13 to October 16, 2023, 14 days  Glucose Management Indicator: 6.9%  Sensor usage:96 %    Impression: - Mostly acceptable blood sugar.  Blood sugar overnight acceptable.  Blood sugar in between the meals are acceptable.  She has random mild hyperglycemia with blood sugar in low 200s with meals especially with breakfast and sometimes with lunch and supper.  No concerning hyperglycemia.  No hypoglycemia.  Hypoglycemia: Patient has no hypoglycemic episodes. Patient has hypoglycemia awareness.  Factors modifying glucose control: 1.  Diabetic diet assessment: 3 meals a day.  2.  Staying active or exercising: no formal exercise.  Trying to walk.  3.  Medication compliance: compliant all of the time.  # Primary hypothyroidism : -Diagnosed with primary hypothyroidism with mildly elevated TSH in 2016.  Initially started on levothyroxine  25 mcg daily and increased to 50 mcg daily.  Interval history  Dexcom data as reviewed above.  Hemoglobin A1c 6.4%.  Mostly acceptable blood sugar.  Mild postprandial hyperglycemia.  She backed down Ozempic  from 1 to 0.5 mg weekly due to GI upset.  She has been taking Farxiga .  Patient last seizure.  Normal thyroid   function test.  She is currently taking levothyroxine  50 mcg daily.  No other complaints today.   Latest Reference Range & Units 10/14/23 10:33  TSH 0.40 - 4.50 mIU/L 3.34  T4,Free(Direct) 0.8 - 1.8 ng/dL 1.3    REVIEW OF SYSTEMS As per history of present illness.   PAST MEDICAL HISTORY: Past Medical History:  Diagnosis Date   Acute blood loss anemia    Acute encephalopathy 07/15/2016   AKI (acute kidney injury) (HCC)    Anemia    has sickle cell trait   Anxiety    Arthritis    Asthma    has used inhaler  in past for asthmatic bronchitis, last time- early 2012   Bilateral primary osteoarthritis of knee 03/26/2016   Complication of anesthesia    wakes up shaking   Diabetes mellitus    Diabetes mellitus with neuropathy (HCC) 10/29/2012   Diverticulitis    Dyslipidemia    Dyspnea    with exertion   Encephalopathy 06/2016   due to medications after surgery   Fibromyalgia    GERD (gastroesophageal reflux disease)    occas. use of  Prilosec   Heart murmur    sees Dr. Andria, last seen- early 2012   Herniated nucleus pulposus, L2-3 06/25/2016   History of back surgery    Hypertension    02/2010- stress test /w PCP   Hypothyroidism    Loose bowel movements 12/2016   Lumbar radiculopathy 06/27/2016   Lumbar stenosis with neurogenic claudication 03/17/2017   Memory disorder 02/16/2016   Myoclonic jerking    Neuromuscular disorder (HCC)    lumbar radiculopathy, lumbago   Nocturnal leg cramps 09/27/2014   Osteoporosis 03/10/2014   Pneumonia    Sickle cell trait (HCC)    Sleep apnea    borderline sleep apnea, states she no longer uses, early 2012- stopped using    Spinal stenosis of lumbar region with radiculopathy 02/28/2014   Spondylolisthesis of lumbar region 03/19/2011   Type II or unspecified type diabetes mellitus without mention of complication, uncontrolled 07/08/2013    PAST SURGICAL HISTORY: Past Surgical History:  Procedure Laterality Date   ABDOMINAL HYSTERECTOMY     adb.cyst     ovarian cyst   BACK SURGERY     2012, 2015 (3 total)   BACK SURGERY  2018   02/24/2017   COLONOSCOPY     EYE SURGERY     macular degeneration treatment - injections   FEMUR IM NAIL Left 06/13/2018   Procedure: INTRAMEDULLARY (IM) NAIL FEMORAL;  Surgeon: Vernetta Lonni GRADE, MD;  Location: [facility];  Service: Orthopedics;  Laterality: Left;   OVARIAN CYST SURGERY     POSTERIOR LUMBAR FUSION 4 LEVEL N/A 03/17/2017   Procedure: Decompression of Lumbar One-Two with Thoracic Ten to Lumbar Two Fusion;   Surgeon: Colon Shove, MD;  Location[facility];  Service: Neurosurgery;  Laterality: N/A;  Decompression of L1-2 with T10 to L2 Fusion   TOTAL KNEE ARTHROPLASTY Right 08/13/2019   Procedure: RIGHT TOTAL KNEE ARTHROPLASTY;  Surgeon: Vernetta Lonni GRADE, MD;  Location: [facility];  Service: Orthopedics;  Laterality: Right;   TOTAL KNEE REVISION Right 07/05/2020   Procedure: TOTAL KNEE REVISION;  Surgeon: Melodi Lerner, MD;  Location: [facility];  Service: Orthopedics;  Laterality: Right;     ALLERGIES: Allergies  Allergen Reactions   Cymbalta  [Duloxetine  Hcl] Diarrhea and Other (See Comments)    Dizziness, headache, irritability   Amlodipine  Besylate Swelling   Baclofen   jerks    Betadine  [Povidone Iodine ] Swelling and Other (See Comments)    SWELLING REACTION DESCRIPTION/SEVERITY UNSPECIFIED  Reaction to betadine  on eye lid and it swelled up   Erythromycin Base Other (See Comments)    unknown   Oxycodone  Hcl    Percocet [Oxycodone -Acetaminophen ] Other (See Comments)    Confusion change personality   Povidone-Iodine  Swelling   Soap Other (See Comments)    unknown   Statins Other (See Comments)    Muscle cramps   Adhesive [Tape] Rash   Lipitor [Atorvastatin] Rash    FAMILY HISTORY:  Family History  Problem Relation Age of Onset   Ovarian cancer Mother    Cancer - Prostate Father    Breast cancer Paternal Aunt    Multiple myeloma Paternal Aunt    Anesthesia problems Neg Hx    Hypotension Neg Hx    Malignant hyperthermia Neg Hx    Pseudochol deficiency Neg Hx     SOCIAL HISTORY: Social History   Socioeconomic History   Marital status: Divorced    Spouse name: Not on file   Number of children: 1   Years of education: Doctorate   Highest education level: Not on file  Occupational History   Occupation: Retired  Tobacco Use   Smoking status: Former    Current packs/day: 0.00    Average packs/day: 0.1 packs/day for 30.0 years (3.0 ttl pk-yrs)    Types:  Cigarettes    Start date: 70    Quit date: 2005    Years since quitting: 20.4   Smokeless tobacco: Never  Vaping Use   Vaping status: Never Used  Substance and Sexual Activity   Alcohol use: Yes    Comment: wine /w dinner on occas.   Drug use: Never   Sexual activity: Never  Other Topics Concern   Not on file  Social History Narrative   Patient drinks caffeine occasionally.   Patient is right handed.   Admitted to [facility] 03/21/17   Divorced    Former smoker - stopped 2000    Alcohol - occasionally wine at dinner   Full code   Social Drivers of Health   Financial Resource Strain: Not on file  Food Insecurity: Not on file  Transportation Needs: Not on file  Physical Activity: Not on file  Stress: Not on file  Social Connections: Not on file    MEDICATIONS:  Current Outpatient Medications  Medication Sig Dispense Refill   acetaminophen  (TYLENOL ) 500 MG tablet Take 1,000 mg by mouth every 8 (eight) hours as needed for mild pain or moderate pain.      amitriptyline (ELAVIL) 25 MG tablet Take 25 mg by mouth at bedtime. (Patient taking differently: Take 25 mg by mouth at bedtime as needed.)     cetirizine  (ZYRTEC ) 10 MG tablet Take 1 tablet (10 mg total) by mouth daily as needed for allergies. 30 tablet 0   Continuous Glucose Sensor (DEXCOM G7 SENSOR) MISC 1 Device by Does not apply route as directed. Change sensor every 10 days 3 each 3   dapagliflozin  propanediol (FARXIGA ) 5 MG TABS tablet Take 1 tablet (5 mg total) by mouth daily. 90 tablet 3   Evolocumab  (REPATHA  SURECLICK) 140 MG/ML SOAJ Inject 140 mg into the skin every 14 (fourteen) days. 6 mL 1   fluticasone  (FLONASE ) 50 MCG/ACT nasal spray Place 1 spray into both nostrils daily as needed for allergies. 0.003 g 0   gabapentin  (NEURONTIN ) 600 MG tablet TAKE 1  TABLET(600 MG) BY MOUTH THREE TIMES DAILY 270 tablet 1   HYDROcodone -acetaminophen  (NORCO/VICODIN) 5-325 MG tablet Take 1 tablet by mouth every  6 (six) hours as needed.     insulin  glargine (LANTUS ) 100 UNIT/ML Solostar Pen Inject 10 Units into the skin daily. 15 mL 4   insulin  lispro (HUMALOG  KWIKPEN) 100 UNIT/ML KwikPen INJECT 6 UNITS BEFORE BREAKFAST,8-10 UNITS AT LUNCH, 8 UNITS AT DINNER, 3-4 UNITS AS NEEDED FOR LARGE CARBS OR ICE CREAM 18 mL 2   Insulin  Pen Needle (BD PEN NEEDLE NANO U/F) 32G X 4 MM MISC Inject insulin  4 times a day 400 each 2   irbesartan -hydrochlorothiazide  (AVALIDE) 150-12.5 MG tablet Take 1 tablet by mouth daily. 90 tablet 2   ketoconazole (NIZORAL) 2 % cream Apply 1 application topically daily as needed (Itching).     levothyroxine  (SYNTHROID ) 50 MCG tablet TAKE 1 TABLET DAILY 90 tablet 3   Multiple Minerals-Vitamins (CAL-MAG-ZINC -D) TABS Take 1 tablet by mouth daily. 330 mg /130 mg/ 5 mg /5 mg     pantoprazole  (PROTONIX ) 40 MG tablet Take 1 tablet by mouth as needed.     Semaglutide , 1 MG/DOSE, 4 MG/3ML SOPN Inject 1 mg as directed once a week. (Patient taking differently: Inject 0.5 mg as directed once a week.) 9 mL 3   SYMBICORT  80-4.5 MCG/ACT inhaler Inhale 2 puffs into the lungs 2 (two) times daily as needed (shortness of breath). 1 Inhaler 0   UNABLE TO FIND Med Name: Omega XL x2     No current facility-administered medications for this visit.    PHYSICAL EXAM: Vitals:   10/16/23 1350  BP: 110/60  Pulse: 83  Resp: 20  SpO2: 95%  Weight: 169 lb 6.4 oz (76.8 kg)  Height: 5' 3 (1.6 m)    Body mass index is 30.01 kg/m.  Wt Readings from Last 3 Encounters:  10/16/23 169 lb 6.4 oz (76.8 kg)  07/22/23 167 lb 12.8 oz (76.1 kg)  06/26/23 168 lb (76.2 kg)    General: Well developed, well nourished female in no apparent distress.  HEENT: AT/[place], no external lesions.  Eyes: Conjunctiva clear and no icterus. Neck: Neck supple  Lungs: Respirations not labored Neurologic: Alert, oriented, normal speech Extremities / Skin: Dry.  Psychiatric: Does not appear depressed or anxious   Diabetic Foot  Exam - Simple   No data filed    LABS Reviewed Lab Results  Component Value Date   HGBA1C 6.4 (H) 10/14/2023   HGBA1C 6.1 (A) 07/22/2023   HGBA1C 6.3 (A) 04/22/2023   Lab Results  Component Value Date   FRUCTOSAMINE 283 06/09/2018   FRUCTOSAMINE 296 (H) 05/16/2017   FRUCTOSAMINE 322 (H) 10/10/2016   Lab Results  Component Value Date   CHOL 192 07/17/2023   HDL 53 07/17/2023   LDLCALC 115 (H) 07/17/2023   LDLDIRECT 166.0 02/09/2019   TRIG 129 07/17/2023   CHOLHDL 3.6 07/17/2023   Lab Results  Component Value Date   MICRALBCREAT 12 07/17/2023   MICRALBCREAT 1.6 06/06/2022   Lab Results  Component Value Date   CREATININE 0.93 10/14/2023   Lab Results  Component Value Date   GFR 49.89 (L) 12/10/2022    ASSESSMENT / PLAN  1. Controlled type 2 diabetes mellitus with complication, with long-term current use of insulin  (HCC)   2. Acquired autoimmune hypothyroidism      Diabetes Mellitus type 2, complicated by diabetic neuropathy. - Diabetic status / severity: Controlled.  Lab Results  Component Value Date  HGBA1C 6.4 (H) 10/14/2023    - Hemoglobin A1c goal : <7%  - Medications: See below.  Continue/stay on Lantus  / Tresiba  8 units daily.   Adjust current dose of lispro/Humalog  6 units with breakfast, 8 to 10 units with dinner/lunch and 6 to 8 units with supper.  Okay to take up to 10 units of Humalog  for lunch and supper.  Continue Farxiga  5 mg daily.  Continue Ozempic  0.5 mg weekly.    - Home glucose testing: Dexcom G7 CGM and check as needed.   - Discussed/ Gave Hypoglycemia treatment plan.  # Consult : not required at this time.   # Annual urine for microalbuminuria/ creatinine ratio, no microalbuminuria currently, continue ACE/ARB /irbesartan . Last  Lab Results  Component Value Date   MICRALBCREAT 12 07/17/2023    # Foot check nightly / neuropathy, continue gabapentin .  # Annual dilated diabetic eye exams.   - Diet: Make healthy  diabetic food choices - Life style / activity / exercise: Discussed.  2. Blood pressure  -  BP Readings from Last 1 Encounters:  10/16/23 110/60    - Control is in target.  - No change in current plans.  3. Lipid status / Hyperlipidemia - Last  Lab Results  Component Value Date   LDLCALC 115 (H) 07/17/2023   - Continue Repatha  140 mg every 2 weeks.  LDL acceptable.  Advised to limit fatty food.  # Primary hypothyroidism -Recent thyroid  function test normal. -Continue levothyroxine /Synthroid  50 mcg daily.    Diagnoses and all orders for this visit:  Controlled type 2 diabetes mellitus with complication, with long-term current use of insulin  (HCC)  Acquired autoimmune hypothyroidism    DISPOSITION Follow up in clinic in 3 months suggested.   All questions answered and patient verbalized understanding of the plan.  [place] Seraphine Gudiel, MD [facility] Endocrinology [facility] [address], [address] [place], [place] [address] Phone # [phone]  At least part of this note was generated using voice recognition software. Inadvertent word errors may have occurred, which were not recognized during the proofreading process.

## 2023-10-17 ENCOUNTER — Encounter: Payer: Self-pay | Admitting: Endocrinology

## 2023-11-07 ENCOUNTER — Encounter: Payer: Self-pay | Admitting: Endocrinology

## 2023-11-07 ENCOUNTER — Other Ambulatory Visit: Payer: Self-pay

## 2023-11-10 ENCOUNTER — Other Ambulatory Visit: Payer: Self-pay

## 2023-11-10 DIAGNOSIS — E118 Type 2 diabetes mellitus with unspecified complications: Secondary | ICD-10-CM

## 2023-11-10 MED ORDER — DAPAGLIFLOZIN PROPANEDIOL 5 MG PO TABS: 5.0000 mg | ORAL_TABLET | Freq: Every day | ORAL | 3 refills | Status: AC

## 2023-11-17 ENCOUNTER — Other Ambulatory Visit: Payer: Self-pay

## 2023-11-17 DIAGNOSIS — E118 Type 2 diabetes mellitus with unspecified complications: Secondary | ICD-10-CM

## 2023-11-17 MED ORDER — INSULIN LISPRO (1 UNIT DIAL) 100 UNIT/ML (KWIKPEN): PEN_INJECTOR | SUBCUTANEOUS | 2 refills | Status: AC

## 2023-12-30 ENCOUNTER — Telehealth: Payer: Self-pay | Admitting: Podiatry

## 2023-12-30 NOTE — Telephone Encounter (Signed)
 Called and left message for patient to contact office to confirm appointment cancellation request

## 2024-01-01 ENCOUNTER — Ambulatory Visit: Admitting: Podiatry

## 2024-01-02 ENCOUNTER — Other Ambulatory Visit: Payer: Self-pay

## 2024-01-02 ENCOUNTER — Emergency Department (HOSPITAL_BASED_OUTPATIENT_CLINIC_OR_DEPARTMENT_OTHER)
Admission: EM | Admit: 2024-01-02 | Discharge: 2024-01-02 | Disposition: A | Discharge status: Home / Self Care | Attending: Emergency Medicine | Admitting: Emergency Medicine

## 2024-01-02 ENCOUNTER — Encounter (HOSPITAL_BASED_OUTPATIENT_CLINIC_OR_DEPARTMENT_OTHER): Payer: Self-pay | Admitting: *Deleted

## 2024-01-02 ENCOUNTER — Ambulatory Visit: Payer: Self-pay

## 2024-01-02 ENCOUNTER — Emergency Department (HOSPITAL_BASED_OUTPATIENT_CLINIC_OR_DEPARTMENT_OTHER): Admitting: Radiology

## 2024-01-02 DIAGNOSIS — Z794 Long term (current) use of insulin: Secondary | ICD-10-CM | POA: Insufficient documentation

## 2024-01-02 DIAGNOSIS — R5383 Other fatigue: Secondary | ICD-10-CM | POA: Diagnosis not present

## 2024-01-02 DIAGNOSIS — Z8616 Personal history of COVID-19: Secondary | ICD-10-CM | POA: Diagnosis not present

## 2024-01-02 DIAGNOSIS — R6883 Chills (without fever): Secondary | ICD-10-CM | POA: Insufficient documentation

## 2024-01-02 LAB — CBC
HCT: 41.7 % (ref 36.0–46.0)
Hemoglobin: 14 g/dL (ref 12.0–15.0)
MCH: 28.5 pg (ref 26.0–34.0)
MCHC: 33.6 g/dL (ref 30.0–36.0)
MCV: 84.8 fL (ref 80.0–100.0)
Platelets: 205 K/uL (ref 150–400)
RBC: 4.92 MIL/uL (ref 3.87–5.11)
RDW: 14.2 % (ref 11.5–15.5)
WBC: 7 K/uL (ref 4.0–10.5)
nRBC: 0 % (ref 0.0–0.2)

## 2024-01-02 LAB — URINALYSIS, ROUTINE W REFLEX MICROSCOPIC
Bilirubin Urine: NEGATIVE
Glucose, UA: 100 mg/dL — AB
Hgb urine dipstick: NEGATIVE
Ketones, ur: NEGATIVE mg/dL
Nitrite: NEGATIVE
Protein, ur: NEGATIVE mg/dL
Specific Gravity, Urine: 1.01 (ref 1.005–1.030)
pH: 8 (ref 5.0–8.0)

## 2024-01-02 LAB — BASIC METABOLIC PANEL WITH GFR
Anion gap: 14 (ref 5–15)
BUN: 14 mg/dL (ref 8–23)
CO2: 24 mmol/L (ref 22–32)
Calcium: 9.6 mg/dL (ref 8.9–10.3)
Chloride: 104 mmol/L (ref 98–111)
Creatinine, Ser: 0.92 mg/dL (ref 0.44–1.00)
GFR, Estimated: >60 mL/min (ref >60)
Glucose, Bld: 192 mg/dL — ABNORMAL HIGH (ref 70–99)
Potassium: 3.8 mmol/L (ref 3.5–5.1)
Sodium: 142 mmol/L (ref 135–145)

## 2024-01-02 LAB — RESP PANEL BY RT-PCR (RSV, FLU A&B, COVID)  RVPGX2
Influenza A by PCR: NEGATIVE
Influenza B by PCR: NEGATIVE
Resp Syncytial Virus by PCR: NEGATIVE
SARS Coronavirus 2 by RT PCR: NEGATIVE

## 2024-01-02 MED ORDER — SODIUM CHLORIDE 0.9 % IV BOLUS: 500.0000 mL | Freq: Once | INTRAVENOUS | Status: DC

## 2024-01-02 NOTE — ED Notes (Signed)
 Second RN to assess and place PIV.  Unsuccessful.  Patient refusing any other attempts.  Offered to use ultrasound but patients states does not want any other attempts and will drink lots of fluids.

## 2024-01-02 NOTE — ED Provider Notes (Signed)
 [place] [facility] AT [facility] Provider Note   CSN: [id number] Arrival date & time: 01/02/24  1358     Patient presents with: No chief complaint on file.   [name] is a 83 y.o. female.   Patient to ED c/o chills, and generalized weakness and fatigue that have been persistent since diagnosis with COVID 2 weeks ago. No cough, fever, SOB, chest pain, vomiting. She reports during active COVID she had copious nonbloody diarrhea which ended 5 days ago. No pain. She became concerned because her symptoms were not persistent or as severe the first time she had COVID in the past.   The history is provided by the patient. No language interpreter was used.       Prior to Admission medications   Medication Sig Start Date End Date Taking? Authorizing Provider  acetaminophen  (TYLENOL ) 500 MG tablet Take 1,000 mg by mouth every 8 (eight) hours as needed for mild pain or moderate pain.     [provider]  amitriptyline (ELAVIL) 25 MG tablet Take 25 mg by mouth at bedtime. Patient taking differently: Take 25 mg by mouth at bedtime as needed. 06/23/23   [provider]  cetirizine  (ZYRTEC ) 10 MG tablet Take 1 tablet (10 mg total) by mouth daily as needed for allergies. 07/03/18   Marsa Arlean BIRCH, MD  Continuous Glucose Sensor (DEXCOM G7 SENSOR) MISC 1 Device by Does not apply route as directed. Change sensor every 10 days 06/04/23   Thapa, [place], MD  dapagliflozin  propanediol (FARXIGA ) 5 MG TABS tablet Take 1 tablet (5 mg total) by mouth daily. 11/10/23   Thapa, [place], MD  Evolocumab  (REPATHA  SURECLICK) 140 MG/ML SOAJ Inject 140 mg into the skin every 14 (fourteen) days. 09/29/23   Thapa, [place], MD  fluticasone  (FLONASE ) 50 MCG/ACT nasal spray Place 1 spray into both nostrils daily as needed for allergies. 07/03/18   Marsa Arlean BIRCH, MD  gabapentin  (NEURONTIN ) 600 MG tablet TAKE 1 TABLET(600 MG) BY MOUTH THREE TIMES DAILY 10/19/18   Jenel Carlin POUR, MD   HYDROcodone -acetaminophen  (NORCO/VICODIN) 5-325 MG tablet Take 1 tablet by mouth every 6 (six) hours as needed. 01/02/21   [provider]  insulin  glargine (LANTUS ) 100 UNIT/ML Solostar Pen Inject 10 Units into the skin daily. 05/05/23   Thapa, [place], MD  insulin  lispro (HUMALOG  KWIKPEN) 100 UNIT/ML KwikPen INJECT 6 UNITS BEFORE BREAKFAST,8-10 UNITS AT LUNCH, 8 UNITS AT DINNER, 3-4 UNITS AS NEEDED FOR LARGE CARBS OR ICE CREAM 11/17/23   Thapa, [place], MD  Insulin  Pen Needle (BD PEN NEEDLE NANO U/F) 32G X 4 MM MISC Inject insulin  4 times a day 05/17/21   [place], MD  irbesartan -hydrochlorothiazide  (AVALIDE) 150-12.5 MG tablet Take 1 tablet by mouth daily. 05/04/21   [place], MD  ketoconazole (NIZORAL) 2 % cream Apply 1 application topically daily as needed (Itching).    [provider]  levothyroxine  (SYNTHROID ) 50 MCG tablet TAKE 1 TABLET DAILY 08/13/23   Thapa, [place], MD  Multiple Minerals-Vitamins (CAL-MAG-ZINC -D) TABS Take 1 tablet by mouth daily. 330 mg /130 mg/ 5 mg /5 mg    [provider]  pantoprazole  (PROTONIX ) 40 MG tablet Take 1 tablet by mouth as needed.    [provider]  Semaglutide ,0.25 or 0.5MG /DOS, (OZEMPIC , 0.25 OR 0.5 MG/DOSE,) 2 MG/1.5ML SOPN Inject 0.5 mg into the skin once a week. 10/16/23   Thapa, [place], MD  SYMBICORT  80-4.5 MCG/ACT inhaler Inhale 2 puffs into the lungs 2 (two) times daily  as needed (shortness of breath). 07/03/18   Marsa Arlean BIRCH, MD  UNABLE TO FIND Med Name: Omega XL x2    [provider]    Allergies: Cymbalta  [duloxetine  hcl], Amlodipine  besylate, Baclofen , Betadine  [povidone iodine ], Erythromycin base, Oxycodone  hcl, Percocet [oxycodone -acetaminophen ], Povidone-iodine , Soap, Statins, Adhesive [tape], and Lipitor [atorvastatin]    Review of Systems  Updated Vital Signs BP (!) 157/84   Pulse 78   Temp 98.4 F (36.9 C) (Oral)   Resp (!) 24   LMP  (LMP Unknown)   SpO2 97%   Physical  Exam Constitutional:      Appearance: She is well-developed.  HENT:     Head: Normocephalic.     Mouth/Throat:     Mouth: Mucous membranes are dry.  Cardiovascular:     Rate and Rhythm: Normal rate and regular rhythm.     Heart sounds: No murmur heard. Pulmonary:     Effort: Pulmonary effort is normal.     Breath sounds: Normal breath sounds. No wheezing, rhonchi or rales.  Abdominal:     General: Bowel sounds are normal.     Palpations: Abdomen is soft.     Tenderness: There is no abdominal tenderness. There is no guarding or rebound.  Musculoskeletal:        General: Normal range of motion.     Cervical back: Normal range of motion and neck supple.  Skin:    General: Skin is warm and dry.  Neurological:     General: No focal deficit present.     Mental Status: She is alert and oriented to person, place, and time.     (all labs ordered are listed, but only abnormal results are displayed) Labs Reviewed  BASIC METABOLIC PANEL WITH GFR - Abnormal; Notable for the following components:      Result Value   Glucose, Bld 192 (*)    All other components within normal limits  URINALYSIS, ROUTINE W REFLEX MICROSCOPIC - Abnormal; Notable for the following components:   Color, Urine COLORLESS (*)    Glucose, UA 100 (*)    Leukocytes,Ua SMALL (*)    Bacteria, UA RARE (*)    All other components within normal limits  RESP PANEL BY RT-PCR (RSV, FLU A&B, COVID)  [id number]  CBC    EKG: EKG Interpretation Date/Time:  Friday January 02 2024 16:18:31 EDT Ventricular Rate:  73 PR Interval:  184 QRS Duration:  89 QT Interval:  374 QTC Calculation: 413 R Axis:   148  Text Interpretation: Sinus or ectopic atrial rhythm Probable lateral infarct, age indeterminate Confirmed by Yolande Charleston [id number]) on 01/02/2024 5:22:47 PM  Radiology: DG Chest 2 View Result Date: 01/02/2024 CLINICAL DATA:  COVID infection. EXAM: DG CHEST 2V COMPARISON:  December 01, 2018. FINDINGS: The heart size and  mediastinal contours are within normal limits. Minimal bibasilar subsegmental atelectasis or scarring is noted. Surgical posterior fusion of lower thoracic and lumbar spine is noted. IMPRESSION: Minimal bibasilar subsegmental atelectasis or scarring. Electronically Signed   By: Lynwood Landy Raddle M.D.   On: 01/02/2024 14:50     Procedures   Medications Ordered in the ED - No data to display   Clinical Course as of 01/02/24 1937  Fri Jan 02, 2024  1710 Patient having persistent symptoms of fatigue and chills since dx COVID 2 weeks ago. No respiratory symptoms, fever, vomiting or persistent diarrhea.   Today, COVID test is negative. CXR without PNA. Labs reassuring. She states she feels dry and expected  IV rehydration in ED. I told her IV fluids would be provided.   Anticipate discharge home after fluids completed.  [SU]  1740 Multiple attempts to start IV are unsuccessful. She is drinking fluids in the room. VSS. She is felt appropriate for discharge home. Discussed close PCP follow up and return precautions.  [SU]    Clinical Course User Index [SU] Odell Balls, PA-C                                 Medical Decision Making Amount and/or Complexity of Data Reviewed Labs: ordered. Radiology: ordered.        Final diagnoses:  Other fatigue    ED Discharge Orders     None          Odell Balls RIGGERS 01/02/24 1937    Yolande Lamar BROCKS, MD 01/04/24 [phone]

## 2024-01-02 NOTE — Telephone Encounter (Signed)
 FYI Only or Action Required?: FYI only for provider.  Patient was last seen in primary care on unknown.  Called Nurse Triage reporting Weakness, Dehydration, and Covid Positive.  Symptoms began 2 weeks ago.  Interventions attempted: Rest, hydration, or home remedies.  Symptoms are: gradually worsening.  Triage Disposition: Go to ED Now (or PCP Triage)  Patient/caregiver understands and will follow disposition?: Yes                           Patient called in on community line.   Copied from CRM [id number]. Topic: Clinical - Red Word Triage >> Jan 02, 2024 12:00 PM [name] wrote: Red Word that prompted transfer to Nurse Triage: weak/ and dehydrated, dxs w/ covid, Reason for Disposition  [1] Drinking very little AND [2] dehydration suspected (e.g., no urine > 12 hours, very dry mouth, very lightheaded)  Answer Assessment - Initial Assessment Questions 1. DESCRIPTION: Describe how you are feeling.     Weak 2. SEVERITY: How bad is it?  Can you stand and walk?     States she feels weak when walking around, states she can still stand and walk 3. ONSET: When did these symptoms begin? (e.g., hours, days, weeks, months)     Almost 2 weeks 4. CAUSE: What do you think is causing the weakness or fatigue? (e.g., not drinking enough fluids, medical problem, trouble sleeping)     States she just got over COVID and has not been drinking enough fluids 6. OTHER SYMPTOMS: Do you have any other symptoms? (e.g., chest pain, fever, cough, SOB, vomiting, diarrhea, bleeding, other areas of pain)     Nausea, dizziness when standing-up too quickly, states skin is crepey, denies fever, denies diarrhea    Patient requested an IV for symptoms. Advised ED. Patient verbalized understanding and agreed to go.  Protocols used: Weakness (Generalized) and Fatigue-A-AH

## 2024-01-02 NOTE — Discharge Instructions (Signed)
 As we discussed, your labs, chest xray and vital signs here are normal. Negative COVID test today as well. Please continue to hydrate well at home and plan to see your doctor early next week for recheck. Return to the ED with any new or concerning symptoms at any time.

## 2024-01-02 NOTE — ED Triage Notes (Signed)
 Pt was dx with covid about 2 weeks ago and took paxlovid but continues to have generalized weakness and would like to be reevaluated.  No sob or acute distress noted in triage.

## 2024-01-20 ENCOUNTER — Ambulatory Visit: Attending: Cardiology | Admitting: Cardiology

## 2024-01-20 ENCOUNTER — Encounter: Payer: Self-pay | Admitting: Cardiology

## 2024-01-20 VITALS — BP 132/60 | HR 89 | Resp 12 | Ht 63.0 in | Wt 166.0 lb

## 2024-01-20 DIAGNOSIS — Z789 Other specified health status: Secondary | ICD-10-CM

## 2024-01-20 DIAGNOSIS — Z794 Long term (current) use of insulin: Secondary | ICD-10-CM

## 2024-01-20 DIAGNOSIS — R0602 Shortness of breath: Secondary | ICD-10-CM | POA: Diagnosis not present

## 2024-01-20 DIAGNOSIS — Z01810 Encounter for preprocedural cardiovascular examination: Secondary | ICD-10-CM | POA: Diagnosis not present

## 2024-01-20 DIAGNOSIS — I10 Essential (primary) hypertension: Secondary | ICD-10-CM

## 2024-01-20 DIAGNOSIS — E7800 Pure hypercholesterolemia, unspecified: Secondary | ICD-10-CM | POA: Diagnosis not present

## 2024-01-20 DIAGNOSIS — E1165 Type 2 diabetes mellitus with hyperglycemia: Secondary | ICD-10-CM

## 2024-01-20 DIAGNOSIS — Z87891 Personal history of nicotine dependence: Secondary | ICD-10-CM

## 2024-01-20 NOTE — Progress Notes (Signed)
 Cardiology Office Note:  .   Date:  01/20/2024  ID:  [name], DOB [birth year], MRN [id number] PCP:  Ransom Other, MD  Former Cardiology Providers: Dr. Maple Frederic  [facility] Providers Cardiologist:  None , [facility] (established care 06/18/2021) Electrophysiologist:  None  Click to update primary MD,subspecialty MD or APP then REFRESH:1}    Chief Complaint  Patient presents with   Follow-up    Shortness of breath    History of Present Illness: .   [name] is a 83 y.o. African-American female whose past medical history and cardiovascular risk factors includes: Obstructive sleep apnea on CPAP, pure hypercholesterolemia, hypertension, obesity due to excess calories, hypothyroidism, history of influenza A infection, sickle cell trait, insulin -dependent diabetes type 2, history of statin intolerance, former smoker.   Patient was referred to practice for evaluation abnormal EKG and dyspnea on exertion.  Given her risk factors and symptoms she did undergo ischemic workup as outlined below.  In addition, her LDL levels in the past were as high as 180 mg/dL and given her insulin -dependent diabetes recommended better lipid management.  She was intolerant to statin therapy and therefore was started on Repatha  in March 2023 and LDL levels have improved to 74 mg/dL.  She was last seen in the office in April 2024 and now presents to the office for approximately 1.5-year follow-up  She is a what is denies anginal chest pain or heart failure symptoms. Was recently seen in the ER for URI symptoms, documentation reviewed from 01/02/2024. Patient is on Repatha  which is currently being refilled by her endocrinologist. Patient states that she is planning to have left knee replacement with Dr. Theotis on April 05, 2024.  They have not formally requested a preoperative risk stratification.  Review of Systems: .   Review of Systems  Cardiovascular:  Negative for chest pain,  claudication, irregular heartbeat, leg swelling, near-syncope, orthopnea, palpitations, paroxysmal nocturnal dyspnea and syncope.  Respiratory:  Negative for shortness of breath.   Hematologic/Lymphatic: Negative for bleeding problem.    Studies Reviewed:   EKG: 01/02/2024: Sinus rhythm, 73 bpm, without underlying ischemia or injury pattern.  Echocardiogram: 09/03/2022:  Normal LV systolic function with visual EF 60-65%. Left ventricle cavity is normal in size. Normal left ventricular wall thickness. Normal global wall motion. Normal diastolic filling pattern, normal LAP. Calculated EF 62%.  Trileaflet aortic valve with no regurgitation. Mild aortic valve leaflet calcification.  Structurally normal tricuspid valve with trace regurgitation. No evidence  of pulmonary hypertension.  No prior available for comparison.    Stress Testing: Lexiscan  Nuclear stress test 07/09/2021:  Nondiagnostic ECG stress. The heart rate response was consistent with Lexiscan.  Myocardial perfusion is normal. Overall LV systolic function is normal without regional wall motion abnormalities. Stress LV EF: 79%.  No previous exam available for comparison. Low risk.   RADIOLOGY: N/A  Risk Assessment/Calculations:   NA   Labs:       Latest Ref Rng & Units 01/02/2024    2:26 PM 05/02/2021   12:10 PM 07/07/2020    3:34 AM  CBC  WBC 4.0 - 10.5 K/uL 7.0  8.6  8.3   Hemoglobin 12.0 - 15.0 g/dL 85.9  86.3  89.3   Hematocrit 36.0 - 46.0 % 41.7  41.7  32.7   Platelets 150 - 400 K/uL 205  223  154        Latest Ref Rng & Units 01/02/2024    2:26 PM 10/14/2023  10:33 AM 07/17/2023   10:36 AM  BMP  Glucose 70 - 99 mg/dL 807  879  890   BUN 8 - 23 mg/dL 14  18  18    Creatinine 0.44 - 1.00 mg/dL 9.07  9.06  8.97   BUN/Creat Ratio 6 - 22 (calc)  SEE NOTE:  18   Sodium 135 - 145 mmol/L 142  140  142   Potassium 3.5 - 5.1 mmol/L 3.8  3.7  3.8   Chloride 98 - 111 mmol/L 104  101  103   CO2 22 - 32 mmol/L 24   29  29    Calcium  8.9 - 10.3 mg/dL 9.6  9.5  9.8       Latest Ref Rng & Units 01/02/2024    2:26 PM 10/14/2023   10:33 AM 07/17/2023   10:36 AM  CMP  Glucose 70 - 99 mg/dL 807  879  890   BUN 8 - 23 mg/dL 14  18  18    Creatinine 0.44 - 1.00 mg/dL 9.07  9.06  8.97   Sodium 135 - 145 mmol/L 142  140  142   Potassium 3.5 - 5.1 mmol/L 3.8  3.7  3.8   Chloride 98 - 111 mmol/L 104  101  103   CO2 22 - 32 mmol/L 24  29  29    Calcium  8.9 - 10.3 mg/dL 9.6  9.5  9.8     Lab Results  Component Value Date   CHOL 192 07/17/2023   HDL 53 07/17/2023   LDLCALC 115 (H) 07/17/2023   LDLDIRECT 166.0 02/09/2019   TRIG 129 07/17/2023   CHOLHDL 3.6 07/17/2023   No results for input(s): LIPOA in the last 8760 hours. No components found for: NTPROBNP No results for input(s): PROBNP in the last 8760 hours. Recent Labs    07/17/23 1036 10/14/23 1033  TSH 4.58* 3.34    Physical Exam:    Today's Vitals   01/20/24 1001  BP: 132/60  Pulse: 89  Resp: 12  SpO2: 98%  Weight: 166 lb (75.3 kg)  Height: 5' 3 (1.6 m)   Body mass index is 29.41 kg/m. Wt Readings from Last 3 Encounters:  01/20/24 166 lb (75.3 kg)  10/16/23 169 lb 6.4 oz (76.8 kg)  07/22/23 167 lb 12.8 oz (76.1 kg)    Physical Exam  Constitutional: No distress.  hemodynamically stable  Neck: No JVD present.  Cardiovascular: Normal rate, regular rhythm, S1 normal and S2 normal. Exam reveals no gallop, no S3 and no S4.  No murmur heard. Pulmonary/Chest: Effort normal and breath sounds normal. No stridor. She has no wheezes. She has no rales.  Musculoskeletal:        General: No edema.     Cervical back: Neck supple.  Skin: Skin is warm.     Impression & Recommendation(s):  Impression:   ICD-10-CM   1. Shortness of breath  R06.02     2. Preoperative cardiovascular examination  Z01.810     3. Benign hypertension  I10 EKG 12-Lead    4. Pure hypercholesterolemia  E78.00 EKG 12-Lead    5. Statin intolerance   Z78.9     6. Type 2 diabetes mellitus with hyperglycemia, with long-term current use of insulin  (HCC)  E11.65    Z79.4     7. Former smoker  Z87.891        Recommendation(s):  Shortness of breath Chronic and stable. Exacerbated by her recent URI Echocardiogram since last office visit notes preserved  LVEF, no regional wall motion abnormalities, and no significant valvular heart disease. No additional testing warranted at this time and asymptomatic female  Preoperative cardiovascular examination Cardiology has not been asked to formally provide recommendations for her upcoming left knee surgery. However we did discuss her preoperative risk assessment According to the Revised Cardiac Risk Index (RCRI), her Perioperative Risk of Major Cardiac Event is (%): 0.9 Her Functional Capacity in METs is: 5.07 according to the Duke Activity Status Index (DASI). From a cardiovascular standpoint she is optimized and no additional testing is warranted.   Patient is considered to be acceptable risk for upcoming noncardiac surgery. EKG is nonischemic. An echocardiogram notes preserved LVEF. As long as there is no change in clinical status between now and her surgery do not anticipate any additional workup.  However if there is a change patient is advised to call us  back for further guidance. Of note, Repatha  and Ozempic  are currently being refilled by endocrinology  Benign hypertension Office blood pressures are well-controlled.   Continue Irbesartan  /hydrochlorothiazide  Continue Farxiga  5 mg p.o. daily Cardiology following peripherally, managed by primary care provider.  Pure hypercholesterolemia Statin intolerance Prior to establishing care patient's LDL levels used to be as high as 180 mg/dL. Given the fact that she was intolerant to statin therapy and her underlying diabetes recommended PCSK9 inhibitors. I was able to start her on Repatha  and LDL levels have improved closer to 74 mg/dL. Since  last office visit her Repatha  is being filled by endocrinology. Labs in July 17, 2023 noted LDL at 115 mg/dL, patient notes that for a short period of time she was not on Repatha  and likely contributed to elevated numbers. Continue Repatha  140 mg/mL every 14 days  Type 2 diabetes mellitus with hyperglycemia, with long-term current use of insulin  (HCC) Most recent hemoglobin A1c 6.4 as of June 2025 Follows up with endocrinology regularly Currently on insulin , Farxiga , Repatha , ARB  Orders Placed:  Orders Placed This Encounter  Procedures   EKG 12-Lead     Final Medication List:   No orders of the defined types were placed in this encounter.   There are no discontinued medications.   Current Outpatient Medications:    acetaminophen  (TYLENOL ) 500 MG tablet, Take 1,000 mg by mouth every 8 (eight) hours as needed for mild pain or moderate pain. , Disp: , Rfl:    amitriptyline (ELAVIL) 25 MG tablet, Take 25 mg by mouth at bedtime. (Patient taking differently: Take 25 mg by mouth at bedtime as needed.), Disp: , Rfl:    cetirizine  (ZYRTEC ) 10 MG tablet, Take 1 tablet (10 mg total) by mouth daily as needed for allergies., Disp: 30 tablet, Rfl: 0   Continuous Glucose Sensor (DEXCOM G7 SENSOR) MISC, 1 Device by Does not apply route as directed. Change sensor every 10 days, Disp: 3 each, Rfl: 3   dapagliflozin  propanediol (FARXIGA ) 5 MG TABS tablet, Take 1 tablet (5 mg total) by mouth daily., Disp: 90 tablet, Rfl: 3   Evolocumab  (REPATHA  SURECLICK) 140 MG/ML SOAJ, Inject 140 mg into the skin every 14 (fourteen) days., Disp: 6 mL, Rfl: 1   fluticasone  (FLONASE ) 50 MCG/ACT nasal spray, Place 1 spray into both nostrils daily as needed for allergies., Disp: 0.003 g, Rfl: 0   gabapentin  (NEURONTIN ) 600 MG tablet, TAKE 1 TABLET(600 MG) BY MOUTH THREE TIMES DAILY, Disp: 270 tablet, Rfl: 1   HYDROcodone -acetaminophen  (NORCO/VICODIN) 5-325 MG tablet, Take 1 tablet by mouth every 6 (six) hours as needed.,  Disp: ,  Rfl:    insulin  glargine (LANTUS ) 100 UNIT/ML Solostar Pen, Inject 10 Units into the skin daily., Disp: 15 mL, Rfl: 4   insulin  lispro (HUMALOG  KWIKPEN) 100 UNIT/ML KwikPen, INJECT 6 UNITS BEFORE BREAKFAST,8-10 UNITS AT LUNCH, 8 UNITS AT DINNER, 3-4 UNITS AS NEEDED FOR LARGE CARBS OR ICE CREAM, Disp: 18 mL, Rfl: 2   Insulin  Pen Needle (BD PEN NEEDLE NANO U/F) 32G X 4 MM MISC, Inject insulin  4 times a day, Disp: 400 each, Rfl: 2   irbesartan -hydrochlorothiazide  (AVALIDE) 150-12.5 MG tablet, Take 1 tablet by mouth daily., Disp: 90 tablet, Rfl: 2   ketoconazole (NIZORAL) 2 % cream, Apply 1 application topically daily as needed (Itching)., Disp: , Rfl:    levothyroxine  (SYNTHROID ) 50 MCG tablet, TAKE 1 TABLET DAILY, Disp: 90 tablet, Rfl: 3   Multiple Minerals-Vitamins (CAL-MAG-ZINC -D) TABS, Take 1 tablet by mouth daily. 330 mg /130 mg/ 5 mg /5 mg, Disp: , Rfl:    pantoprazole  (PROTONIX ) 40 MG tablet, Take 1 tablet by mouth as needed., Disp: , Rfl:    Semaglutide ,0.25 or 0.5MG /DOS, (OZEMPIC , 0.25 OR 0.5 MG/DOSE,) 2 MG/1.5ML SOPN, Inject 0.5 mg into the skin once a week., Disp: 9 mL, Rfl: 3   SYMBICORT  80-4.5 MCG/ACT inhaler, Inhale 2 puffs into the lungs 2 (two) times daily as needed (shortness of breath)., Disp: 1 Inhaler, Rfl: 0   UNABLE TO FIND, Med Name: Omega XL x2, Disp: , Rfl:   Consent:   NA  Disposition:   Recommended follow-up on appearing basis; however, patient requested 1 year follow-up visits given her cardiovascular risk factors and age.  Her questions and concerns were addressed to her satisfaction. She voices understanding of the recommendations provided during this encounter.    Signed, Madonna Michele HAS, [facility]  A Division of [facility] [address] [place], [place] [address]  01/20/2024 10:41 AM

## 2024-01-20 NOTE — Patient Instructions (Signed)
 Medication Instructions:  Your physician recommends that you continue on your current medications as directed. Please refer to the Current Medication list given to you today.  *If you need a refill on your cardiac medications before your next appointment, please call your pharmacy*  Lab Work: None ordered.  You may go to any [facility] Location for your lab work:  [facility] - [address] ([facility]) - [address] [phone] [address]  [place] - [address]   [place]  - [address]   [place] - [address] - [address] [employer]  - [address] [place] - [address] ([facility]   If you have labs (blood work) drawn today and your tests are completely normal, you will receive your results only by: [employer] (if you have MyChart)  If you have any lab test that is abnormal or we need to change your treatment, we will call you or send a MyChart message to review the results.  Testing/Procedures: None ordered.  Follow-Up: At [facility], you and your health needs are our priority.  As part of our continuing mission to provide you with exceptional heart care, we have created designated Provider Care Teams.  These Care Teams include your primary Cardiologist (physician) and Advanced Practice Providers (APPs -  Physician Assistants and Nurse Practitioners) who all work together to provide you with the care you need, when you need it.   Your next appointment:   1 year(s)  The format for your next appointment:   In Person  Provider:   Madonna Large, DO

## 2024-01-26 ENCOUNTER — Other Ambulatory Visit

## 2024-01-27 ENCOUNTER — Other Ambulatory Visit: Payer: Self-pay

## 2024-01-27 ENCOUNTER — Other Ambulatory Visit: Payer: Self-pay | Admitting: Endocrinology

## 2024-01-27 ENCOUNTER — Ambulatory Visit (INDEPENDENT_AMBULATORY_CARE_PROVIDER_SITE_OTHER): Admitting: Podiatry

## 2024-01-27 ENCOUNTER — Other Ambulatory Visit

## 2024-01-27 DIAGNOSIS — E118 Type 2 diabetes mellitus with unspecified complications: Secondary | ICD-10-CM

## 2024-01-27 DIAGNOSIS — M79675 Pain in left toe(s): Secondary | ICD-10-CM | POA: Diagnosis not present

## 2024-01-27 DIAGNOSIS — M79674 Pain in right toe(s): Secondary | ICD-10-CM | POA: Diagnosis not present

## 2024-01-27 DIAGNOSIS — B351 Tinea unguium: Secondary | ICD-10-CM | POA: Diagnosis not present

## 2024-01-27 NOTE — Progress Notes (Signed)
  Subjective:  Patient ID: [name], female    DOB: [birth year],  MRN: [id number]  Chief Complaint  Patient presents with   Nail Problem    Bilateral Great toe nail lateral sides. Soreness with closed toe shoes. Diabetic A1c 6.4. no anti coag    83 y.o. female presents with the above complaint. History confirmed with patient.  She complains of ingrowing toenails of both toenails.  The remaining nails are thickened and elongated.  She has well-controlled type 2 diabetes   Objective:  Physical Exam: warm, good capillary refill, no trophic changes or ulcerative lesions, normal DP and PT pulses, and normal sensory exam. Left Foot: dystrophic yellowed discolored nail plates with subungual debris and pincer nail deformity of hallux Right Foot: dystrophic yellowed discolored nail plates with subungual debris and pincer nail deformity of hallux  Assessment:   1. Pain due to onychomycosis of toenails of both feet      Plan:  Patient was evaluated and treated and all questions answered.   Discussed the etiology and treatment options for the condition in detail with the patient. Educated patient on the topical and oral treatment options for mycotic nails. Recommended debridement of the nails today. Sharp and mechanical debridement performed of all painful and mycotic nails today. Nails debrided in length and thickness using a nail nipper to level of comfort. Discussed treatment options including appropriate shoe gear. Follow up as needed for painful nails.  We also again discussed permanent matricectomy of the lateral borders of the bilateral hallux.  She will follow-up me as needed for this.      Return if symptoms worsen or fail to improve.

## 2024-01-28 ENCOUNTER — Ambulatory Visit: Payer: Self-pay | Admitting: Endocrinology

## 2024-01-28 LAB — LIPID PANEL
Cholesterol: 162 mg/dL (ref <200)
HDL: 47 mg/dL — ABNORMAL LOW (ref >=50)
LDL Cholesterol (Calc): 91 mg/dL
Non-HDL Cholesterol (Calc): 115 mg/dL (ref <130)
Total CHOL/HDL Ratio: 3.4 (calc) (ref <5.0)
Triglycerides: 142 mg/dL (ref <150)

## 2024-01-28 LAB — HEMOGLOBIN A1C
Hgb A1c MFr Bld: 6.4 % — ABNORMAL HIGH (ref <5.7)
Mean Plasma Glucose: 137 mg/dL
eAG (mmol/L): 7.6 mmol/L

## 2024-01-29 ENCOUNTER — Encounter: Payer: Self-pay | Admitting: Endocrinology

## 2024-01-29 ENCOUNTER — Ambulatory Visit (INDEPENDENT_AMBULATORY_CARE_PROVIDER_SITE_OTHER): Admitting: Endocrinology

## 2024-01-29 VITALS — BP 124/72 | HR 80 | Resp 16 | Ht 63.0 in | Wt 167.2 lb

## 2024-01-29 DIAGNOSIS — Z794 Long term (current) use of insulin: Secondary | ICD-10-CM | POA: Diagnosis not present

## 2024-01-29 DIAGNOSIS — E782 Mixed hyperlipidemia: Secondary | ICD-10-CM | POA: Diagnosis not present

## 2024-01-29 DIAGNOSIS — E118 Type 2 diabetes mellitus with unspecified complications: Secondary | ICD-10-CM | POA: Diagnosis not present

## 2024-01-29 DIAGNOSIS — E063 Autoimmune thyroiditis: Secondary | ICD-10-CM

## 2024-01-29 MED ORDER — INSULIN GLARGINE 100 UNIT/ML SOLOSTAR PEN: 8.0000 [IU] | PEN_INJECTOR | Freq: Every day | SUBCUTANEOUS | 4 refills | Status: AC

## 2024-01-29 NOTE — Progress Notes (Signed)
 Outpatient Endocrinology Note [place] England Greb, MD   Patient's Name: [name]    DOB: [birth year]    MRN: [id number]                                                    REASON OF VISIT: Follow up for type 2 diabetes mellitus  PCP: Ransom Other, MD  HISTORY OF PRESENT ILLNESS:   DESTINY HAGIN is a 83 y.o. old female with past medical history listed below, is here for follow up for type 2 diabetes mellitus / hypothyroidism.   Pertinent Diabetes History: Patient was previously seen by Dr. Von and was last time seen in August 2024.  Patient was diagnosed with type 2 diabetes mellitus in 1998.  Initially treated with oral antidiabetic medication and insulin  therapy was added later.  She has been on insulin  therapy for several years.  Chronic Diabetes Complications : Retinopathy: no. Last ophthalmology exam was done on 02/2022, following with ophthalmology regularly.  Nephropathy: CKD IIIa, on ACE/ARB / irbesartan  Peripheral neuropathy: yes, on gabapentin . Coronary artery disease: no Stroke: no  Relevant comorbidities and cardiovascular risk factors: Obesity: no Body mass index is 29.62 kg/m.  Hypertension: Yes  Hyperlipidemia : Yes, on statin   The lipid abnormality consists of elevated LDL persistently. Did not tolerate Crestor  or lovastatin because of muscle cramps. She stopped Zetia  presumably because she was having muscle cramps.   She was started on Repatha  in 2021  Current / Home Diabetic regimen includes:  Non-insulin  hypoglycemic drugs : Farxiga  5 mg daily, Ozempic  0.5 mg weekly.  The insulin  regimen : Humalog  6 at breakfast and 8-10 lunch, 6-8 with supper, Tresiba  8 units daily in the morning.    Prior diabetic medications: Byetta, Glumetza, Amaryl , Victoza, Onglyza, metformin, Lantus  in the past. Ozempic , had GI intolerance with higher dose.  Glycemic data:    CONTINUOUS GLUCOSE MONITORING SYSTEM (CGMS) INTERPRETATION: At today's visit, we reviewed CGM  downloads. The full report is scanned in the media. Reviewing the CGM trends, blood glucose are as follows:  Dexcom G7 CGM-  Sensor Download (Sensor download was reviewed and summarized below.) Dates: September 26 to October 9 , 2025, 14 days  Glucose Management Indicator: 6.8%  Sensor usage:  95%    Impression: Mostly acceptable blood sugar with occasional mild hyperglycemia with blood sugar in low 200.  No concern hypoglycemia.  Hypoglycemia: Patient has no hypoglycemic episodes. Patient has hypoglycemia awareness.  Factors modifying glucose control: 1.  Diabetic diet assessment: 3 meals a day.  2.  Staying active or exercising: no formal exercise.  Trying to walk.  3.  Medication compliance: compliant all of the time.  # Primary hypothyroidism : -Diagnosed with primary hypothyroidism with mildly elevated TSH in 2016.  Initially started on levothyroxine  25 mcg daily and increased to 50 mcg daily.  Interval history  CGM data as reviewed above.  Mostly acceptable blood sugar.  Hemoglobin A1c 6.4%.  Recent laboratory results reviewed.  Improvement on cholesterol levels.  She is currently taking levothyroxine  50 mcg daily.  No hypo and hyperthyroid symptoms.  No other complaints today.  REVIEW OF SYSTEMS As per history of present illness.   PAST MEDICAL HISTORY: Past Medical History:  Diagnosis Date   Acute blood loss anemia    Acute encephalopathy 07/15/2016   AKI (acute kidney  injury)    Anemia    has sickle cell trait   Anxiety    Arthritis    Asthma    has used inhaler in past for asthmatic bronchitis, last time- early 2012   Bilateral primary osteoarthritis of knee 03/26/2016   Complication of anesthesia    wakes up shaking   Diabetes mellitus    Diabetes mellitus with neuropathy (HCC) 10/29/2012   Diverticulitis    Dyslipidemia    Dyspnea    with exertion   Encephalopathy 06/2016   due to medications after surgery   Fibromyalgia    GERD (gastroesophageal  reflux disease)    occas. use of  Prilosec   Heart murmur    sees Dr. Andria, last seen- early 2012   Herniated nucleus pulposus, L2-3 06/25/2016   History of back surgery    Hypertension    02/2010- stress test /w PCP   Hypothyroidism    Loose bowel movements 12/2016   Lumbar radiculopathy 06/27/2016   Lumbar stenosis with neurogenic claudication 03/17/2017   Memory disorder 02/16/2016   Myoclonic jerking    Neuromuscular disorder (HCC)    lumbar radiculopathy, lumbago   Nocturnal leg cramps 09/27/2014   Osteoporosis 03/10/2014   Pneumonia    Sickle cell trait    Sleep apnea    borderline sleep apnea, states she no longer uses, early 2012- stopped using    Spinal stenosis of lumbar region with radiculopathy 02/28/2014   Spondylolisthesis of lumbar region 03/19/2011   Type II or unspecified type diabetes mellitus without mention of complication, uncontrolled 07/08/2013    PAST SURGICAL HISTORY: Past Surgical History:  Procedure Laterality Date   ABDOMINAL HYSTERECTOMY     adb.cyst     ovarian cyst   BACK SURGERY     2012, 2015 (3 total)   BACK SURGERY  2018   02/24/2017   COLONOSCOPY     EYE SURGERY     macular degeneration treatment - injections   FEMUR IM NAIL Left 06/13/2018   Procedure: INTRAMEDULLARY (IM) NAIL FEMORAL;  Surgeon: Vernetta Lonni GRADE, MD;  Location: [facility];  Service: Orthopedics;  Laterality: Left;   OVARIAN CYST SURGERY     POSTERIOR LUMBAR FUSION 4 LEVEL N/A 03/17/2017   Procedure: Decompression of Lumbar One-Two with Thoracic Ten to Lumbar Two Fusion;  Surgeon: Colon Shove, MD;  Location: [facility];  Service: Neurosurgery;  Laterality: N/A;  Decompression of L1-2 with T10 to L2 Fusion   TOTAL KNEE ARTHROPLASTY Right 08/13/2019   Procedure: RIGHT TOTAL KNEE ARTHROPLASTY;  Surgeon: Vernetta Lonni GRADE, MD;  Location: [facility];  Service: Orthopedics;  Laterality: Right;   TOTAL KNEE REVISION Right 07/05/2020   Procedure: TOTAL KNEE REVISION;  Surgeon:  Melodi Lerner, MD;  Location: [facility];  Service: Orthopedics;  Laterality: Right;     ALLERGIES: Allergies  Allergen Reactions   Cymbalta  [Duloxetine  Hcl] Diarrhea and Other (See Comments)    Dizziness, headache, irritability   Amlodipine  Besylate Swelling   Baclofen      jerks    Betadine  [Povidone Iodine ] Swelling and Other (See Comments)    SWELLING REACTION DESCRIPTION/SEVERITY UNSPECIFIED  Reaction to betadine  on eye lid and it swelled up   Erythromycin Base Other (See Comments)    unknown   Oxycodone  Hcl    Percocet [Oxycodone -Acetaminophen ] Other (See Comments)    Confusion change personality   Povidone-Iodine  Swelling   Soap Other (See Comments)    unknown   Statins Other (See Comments)  Muscle cramps   Adhesive [Tape] Rash   Lipitor [Atorvastatin] Rash    FAMILY HISTORY:  Family History  Problem Relation Age of Onset   Ovarian cancer Mother    Cancer - Prostate Father    Breast cancer Paternal Aunt    Multiple myeloma Paternal Aunt    Anesthesia problems Neg Hx    Hypotension Neg Hx    Malignant hyperthermia Neg Hx    Pseudochol deficiency Neg Hx     SOCIAL HISTORY: Social History   Socioeconomic History   Marital status: Divorced    Spouse name: Not on file   Number of children: 1   Years of education: Doctorate   Highest education level: Not on file  Occupational History   Occupation: Retired  Tobacco Use   Smoking status: Former    Current packs/day: 0.00    Average packs/day: 0.1 packs/day for 30.0 years (3.0 ttl pk-yrs)    Types: Cigarettes    Start date: 39    Quit date: 2005    Years since quitting: 20.7   Smokeless tobacco: Never  Vaping Use   Vaping status: Never Used  Substance and Sexual Activity   Alcohol use: Yes    Comment: wine /w dinner on occas.   Drug use: Never   Sexual activity: Never  Other Topics Concern   Not on file  Social History Narrative   Patient drinks caffeine occasionally.   Patient is right  handed.   Admitted to [facility] 03/21/17   Divorced    Former smoker - stopped 2000    Alcohol - occasionally wine at dinner   Full code   Social Drivers of Health   Financial Resource Strain: Not on file  Food Insecurity: Not on file  Transportation Needs: Not on file  Physical Activity: Not on file  Stress: Not on file  Social Connections: Not on file    MEDICATIONS:  Current Outpatient Medications  Medication Sig Dispense Refill   acetaminophen  (TYLENOL ) 500 MG tablet Take 1,000 mg by mouth every 8 (eight) hours as needed for mild pain or moderate pain.      amitriptyline (ELAVIL) 25 MG tablet Take 25 mg by mouth at bedtime. (Patient taking differently: Take 25 mg by mouth at bedtime as needed.)     cetirizine  (ZYRTEC ) 10 MG tablet Take 1 tablet (10 mg total) by mouth daily as needed for allergies. 30 tablet 0   Continuous Glucose Sensor (DEXCOM G7 SENSOR) MISC 1 Device by Does not apply route as directed. Change sensor every 10 days 3 each 3   dapagliflozin  propanediol (FARXIGA ) 5 MG TABS tablet Take 1 tablet (5 mg total) by mouth daily. 90 tablet 3   Evolocumab  (REPATHA  SURECLICK) 140 MG/ML SOAJ Inject 140 mg into the skin every 14 (fourteen) days. 6 mL 1   fluticasone  (FLONASE ) 50 MCG/ACT nasal spray Place 1 spray into both nostrils daily as needed for allergies. 0.003 g 0   gabapentin  (NEURONTIN ) 600 MG tablet TAKE 1 TABLET(600 MG) BY MOUTH THREE TIMES DAILY 270 tablet 1   HYDROcodone -acetaminophen  (NORCO/VICODIN) 5-325 MG tablet Take 1 tablet by mouth every 6 (six) hours as needed.     insulin  lispro (HUMALOG  KWIKPEN) 100 UNIT/ML KwikPen INJECT 6 UNITS BEFORE BREAKFAST,8-10 UNITS AT LUNCH, 8 UNITS AT DINNER, 3-4 UNITS AS NEEDED FOR LARGE CARBS OR ICE CREAM 18 mL 2   Insulin  Pen Needle (BD PEN NEEDLE NANO U/F) 32G X 4 MM MISC Inject insulin  4  times a day 400 each 2   irbesartan -hydrochlorothiazide  (AVALIDE) 150-12.5 MG tablet Take 1 tablet by mouth daily. 90  tablet 2   ketoconazole (NIZORAL) 2 % cream Apply 1 application topically daily as needed (Itching).     levothyroxine  (SYNTHROID ) 50 MCG tablet TAKE 1 TABLET DAILY 90 tablet 3   Multiple Minerals-Vitamins (CAL-MAG-ZINC -D) TABS Take 1 tablet by mouth daily. 330 mg /130 mg/ 5 mg /5 mg     pantoprazole  (PROTONIX ) 40 MG tablet Take 1 tablet by mouth as needed.     Semaglutide ,0.25 or 0.5MG /DOS, (OZEMPIC , 0.25 OR 0.5 MG/DOSE,) 2 MG/1.5ML SOPN Inject 0.5 mg into the skin once a week. 9 mL 3   SYMBICORT  80-4.5 MCG/ACT inhaler Inhale 2 puffs into the lungs 2 (two) times daily as needed (shortness of breath). 1 Inhaler 0   UNABLE TO FIND Med Name: Omega XL x2     insulin  glargine (LANTUS ) 100 UNIT/ML Solostar Pen Inject 8 Units into the skin daily. 15 mL 4   No current facility-administered medications for this visit.    PHYSICAL EXAM: Vitals:   01/29/24 1104  BP: 124/72  Pulse: 80  Resp: 16  SpO2: 95%  Weight: 167 lb 3.2 oz (75.8 kg)  Height: 5' 3 (1.6 m)    Body mass index is 29.62 kg/m.  Wt Readings from Last 3 Encounters:  01/29/24 167 lb 3.2 oz (75.8 kg)  01/20/24 166 lb (75.3 kg)  10/16/23 169 lb 6.4 oz (76.8 kg)    General: Well developed, well nourished female in no apparent distress.  HEENT: AT/[place], no external lesions.  Eyes: Conjunctiva clear and no icterus. Neck: Neck supple  Lungs: Respirations not labored Neurologic: Alert, oriented, normal speech Extremities / Skin: Dry.  Psychiatric: Does not appear depressed or anxious   Diabetic Foot Exam - Simple   No data filed    LABS Reviewed Lab Results  Component Value Date   HGBA1C 6.4 (H) 01/27/2024   HGBA1C 6.4 (H) 10/14/2023   HGBA1C 6.1 (A) 07/22/2023   Lab Results  Component Value Date   FRUCTOSAMINE 283 06/09/2018   FRUCTOSAMINE 296 (H) 05/16/2017   FRUCTOSAMINE 322 (H) 10/10/2016   Lab Results  Component Value Date   CHOL 162 01/27/2024   HDL 47 (L) 01/27/2024   LDLCALC 91 01/27/2024   LDLDIRECT  166.0 02/09/2019   TRIG 142 01/27/2024   CHOLHDL 3.4 01/27/2024   Lab Results  Component Value Date   MICRALBCREAT 12 07/17/2023   Lab Results  Component Value Date   CREATININE 0.92 01/02/2024   Lab Results  Component Value Date   GFR 49.89 (L) 12/10/2022    ASSESSMENT / PLAN  1. Controlled type 2 diabetes mellitus with complication, with long-term current use of insulin  (HCC)   2. Acquired autoimmune hypothyroidism   3. Mixed hyperlipidemia     Diabetes Mellitus type 2, complicated by diabetic neuropathy. - Diabetic status / severity: Controlled.  Lab Results  Component Value Date   HGBA1C 6.4 (H) 01/27/2024    - Hemoglobin A1c goal : <7%  - Medications: See below.  No change today.  Continue/stay on Lantus  / Tresiba  8 units daily.   Adjust current dose of lispro/Humalog  6 units with breakfast, 8 to 10 units with dinner/lunch and 6 to 8 units with supper.  Okay to take up to 10 units of Humalog  for lunch and supper.  Continue Farxiga  5 mg daily.  Continue Ozempic  0.5 mg weekly.    - Home glucose testing:  Dexcom G7 CGM and check as needed.   - Discussed/ Gave Hypoglycemia treatment plan.  # Consult : not required at this time.   # Annual urine for microalbuminuria/ creatinine ratio, no microalbuminuria currently, continue ACE/ARB /irbesartan . Last  Lab Results  Component Value Date   MICRALBCREAT 12 07/17/2023    # Foot check nightly / neuropathy, continue gabapentin .  # Annual dilated diabetic eye exams.   - Diet: Make healthy diabetic food choices - Life style / activity / exercise: Discussed.  2. Blood pressure  -  BP Readings from Last 1 Encounters:  01/29/24 124/72    - Control is in target.  - No change in current plans.  3. Lipid status / Hyperlipidemia - Last  Lab Results  Component Value Date   LDLCALC 91 01/27/2024   - Continue Repatha  140 mg every 2 weeks.  LDL acceptable.  Advised to limit fatty food.  # Primary  hypothyroidism -Continue levothyroxine /Synthroid  50 mcg daily.    Diagnoses and all orders for this visit:  Controlled type 2 diabetes mellitus with complication, with long-term current use of insulin  (HCC) -     Basic metabolic panel with GFR -     Hemoglobin A1c -     Microalbumin / creatinine urine ratio  Acquired autoimmune hypothyroidism -     T4, free -     TSH  Mixed hyperlipidemia  Other orders -     insulin  glargine (LANTUS ) 100 UNIT/ML Solostar Pen; Inject 8 Units into the skin daily.     DISPOSITION Follow up in clinic in 4 months suggested.  Labs prior to follow-up visit as ordered.   All questions answered and patient verbalized understanding of the plan.  [place] Radford Pease, MD [facility] Endocrinology [facility] [address] [place], [address] [place], [place] [address] Phone # [phone]  At least part of this note was generated using voice recognition software. Inadvertent word errors may have occurred, which were not recognized during the proofreading process.

## 2024-03-08 NOTE — H&P (Addendum)
 TOTAL KNEE ADMISSION H&P  Patient is being admitted for left medial unicompartmental knee arthroplasty.  Subjective:  Chief Complaint: Left knee pain.  HPI: [name], 83 y.o. female has a history of pain and functional disability in the left knee due to arthritis and has failed non-surgical conservative treatments for greater than 12 weeks to include NSAID's and/or analgesics, corticosteriod injections, and activity modification. Onset of symptoms was gradual, starting several years ago with gradually worsening course since that time. The patient noted prior procedures on the knee to include  IM nail left femur on the left knee.  Patient currently rates pain in the left knee at 9 out of 10 with activity. Patient has night pain, worsening of pain with activity and weight bearing, pain with passive range of motion, and crepitus. Patient has evidence of significant medial joint space narrowing but are not bone-on-bone. The left knee also shows an IM nail with two distal interlocks in the distal femur by imaging studies. There is no active infection.  Patient Active Problem List   Diagnosis Date Noted   Pincer nail deformity 06/26/2023   Failed total knee arthroplasty 07/05/2020   Failed total right knee replacement 07/05/2020   Diverticular disease of colon 05/18/2020   Gastroesophageal reflux disease 05/18/2020   Irritable bowel syndrome with diarrhea 05/18/2020   Status post total right knee replacement 08/13/2019   Unilateral primary osteoarthritis, right knee 05/24/2019   Fracture, proximal femur (HCC) 06/12/2018   Femur fracture, left (HCC) 06/12/2018   Acute blood loss as cause of postoperative anemia 03/30/2017   Diabetic polyneuropathy associated with secondary diabetes mellitus (HCC) 03/30/2017   Asthma 03/30/2017   Dyslipidemia 03/26/2017   Lumbar stenosis with neurogenic claudication 03/17/2017   Spinal stenosis of lumbar region 03/03/2017   Myoclonic jerking    Nausea     Acute encephalopathy 07/15/2016   Lumbar radiculopathy 06/27/2016   Hypothyroidism 06/26/2016   Labile blood glucose    Surgery, elective    Post-operative pain    Sickle cell trait    Acute blood loss anemia    History of back surgery    AKI (acute kidney injury)    Herniated nucleus pulposus, L2-3 06/25/2016   Bilateral primary osteoarthritis of knee 03/26/2016   Osteoarthritis, hand 03/26/2016   Other insomnia 03/26/2016   Fibromyalgia 09/27/2014   Nocturnal leg cramps 09/27/2014   Body mass index (BMI) of 30.0-30.9 in adult 08/04/2014   Neuropathy 03/10/2014   Allergic rhinitis 03/10/2014   Osteoporosis 03/10/2014   Spinal stenosis of lumbar region with radiculopathy 02/28/2014   Type 2 diabetes mellitus with complication, with long-term current use of insulin  (HCC) 07/08/2013   Hypokalemia 11/02/2012   Essential hypertension, benign 11/02/2012   Diabetes mellitus with neuropathy (HCC) 10/29/2012   Hyperlipidemia 10/29/2012   Spondylolisthesis of lumbar region 03/19/2011   Lumbar radicular pain 03/19/2011    Past Medical History:  Diagnosis Date   Acute blood loss anemia    Acute encephalopathy 07/15/2016   AKI (acute kidney injury)    Anemia    has sickle cell trait   Anxiety    Arthritis    Asthma    has used inhaler in past for asthmatic bronchitis, last time- early 2012   Bilateral primary osteoarthritis of knee 03/26/2016   Complication of anesthesia    wakes up shaking   Diabetes mellitus    Diabetes mellitus with neuropathy (HCC) 10/29/2012   Diverticulitis    Dyslipidemia    Dyspnea  with exertion   Encephalopathy 06/2016   due to medications after surgery   Fibromyalgia    GERD (gastroesophageal reflux disease)    occas. use of  Prilosec   Heart murmur    sees Dr. Andria, last seen- early 2012   Herniated nucleus pulposus, L2-3 06/25/2016   History of back surgery    Hypertension    02/2010- stress test /w PCP   Hypothyroidism    Loose bowel  movements 12/2016   Lumbar radiculopathy 06/27/2016   Lumbar stenosis with neurogenic claudication 03/17/2017   Memory disorder 02/16/2016   Myoclonic jerking    Neuromuscular disorder (HCC)    lumbar radiculopathy, lumbago   Nocturnal leg cramps 09/27/2014   Osteoporosis 03/10/2014   Pneumonia    Sickle cell trait    Sleep apnea    borderline sleep apnea, states she no longer uses, early 2012- stopped using    Spinal stenosis of lumbar region with radiculopathy 02/28/2014   Spondylolisthesis of lumbar region 03/19/2011   Type II or unspecified type diabetes mellitus without mention of complication, uncontrolled 07/08/2013    Past Surgical History:  Procedure Laterality Date   ABDOMINAL HYSTERECTOMY     adb.cyst     ovarian cyst   BACK SURGERY     2012, 2015 (3 total)   BACK SURGERY  2018   02/24/2017   COLONOSCOPY     EYE SURGERY     macular degeneration treatment - injections   FEMUR IM NAIL Left 06/13/2018   Procedure: INTRAMEDULLARY (IM) NAIL FEMORAL;  Surgeon: Vernetta Lonni GRADE, MD;  Location: [facility];  Service: Orthopedics;  Laterality: Left;   OVARIAN CYST SURGERY     POSTERIOR LUMBAR FUSION 4 LEVEL N/A 03/17/2017   Procedure: Decompression of Lumbar One-Two with Thoracic Ten to Lumbar Two Fusion;  Surgeon: Colon Shove, MD;  Location: [facility] OR;  Service: Neurosurgery;  Laterality: N/A;  Decompression of L1-2 with T10 to L2 Fusion   TOTAL KNEE ARTHROPLASTY Right 08/13/2019   Procedure: RIGHT TOTAL KNEE ARTHROPLASTY;  Surgeon: Vernetta Lonni GRADE, MD;  Location: [facility];  Service: Orthopedics;  Laterality: Right;   TOTAL KNEE REVISION Right 07/05/2020   Procedure: TOTAL KNEE REVISION;  Surgeon: Melodi Lerner, MD;  Location: [facility];  Service: Orthopedics;  Laterality: Right;     Prior to Admission medications   Medication Sig Start Date End Date Taking? Authorizing Provider  acetaminophen  (TYLENOL ) 500 MG tablet Take 1,000 mg by mouth every 8 (eight) hours as  needed for mild pain or moderate pain.     [provider]  amitriptyline  (ELAVIL ) 25 MG tablet Take 25 mg by mouth at bedtime. Patient taking differently: Take 25 mg by mouth at bedtime as needed. 06/23/23   [provider]  cetirizine  (ZYRTEC ) 10 MG tablet Take 1 tablet (10 mg total) by mouth daily as needed for allergies. 07/03/18   Marsa Arlean BIRCH, MD  Continuous Glucose Sensor (DEXCOM G7 SENSOR) MISC 1 Device by Does not apply route as directed. Change sensor every 10 days 06/04/23   Thapa, Sudan, MD  dapagliflozin  propanediol (FARXIGA ) 5 MG TABS tablet Take 1 tablet (5 mg total) by mouth daily. 11/10/23   Thapa, Sudan, MD  Evolocumab  (REPATHA  SURECLICK) 140 MG/ML SOAJ Inject 140 mg into the skin every 14 (fourteen) days. 09/29/23   Thapa, Sudan, MD  fluticasone  (FLONASE ) 50 MCG/ACT nasal spray Place 1 spray into both nostrils daily as needed for allergies. 07/03/18   Marsa Arlean BIRCH, MD  gabapentin  (NEURONTIN ) 600 MG tablet TAKE 1 TABLET(600 MG) BY MOUTH THREE TIMES DAILY 10/19/18   Jenel Carlin POUR, MD  HYDROcodone -acetaminophen  (NORCO/VICODIN) 5-325 MG tablet Take 1 tablet by mouth every 6 (six) hours as needed. 01/02/21   [provider]  insulin  glargine (LANTUS ) 100 UNIT/ML Solostar Pen Inject 8 Units into the skin daily. 01/29/24   Thapa, Sudan, MD  insulin  lispro (HUMALOG  KWIKPEN) 100 UNIT/ML KwikPen INJECT 6 UNITS BEFORE BREAKFAST,8-10 UNITS AT LUNCH, 8 UNITS AT DINNER, 3-4 UNITS AS NEEDED FOR LARGE CARBS OR ICE CREAM 11/17/23   Thapa, Sudan, MD  Insulin  Pen Needle (BD PEN NEEDLE NANO U/F) 32G X 4 MM MISC Inject insulin  4 times a day 05/17/21   [place]on [place], MD  irbesartan -hydrochlorothiazide  (AVALIDE) 150-12.5 MG tablet Take 1 tablet by mouth daily. 05/04/21   [place]on [place], MD  ketoconazole (NIZORAL) 2 % cream Apply 1 application topically daily as needed (Itching).    [provider]  levothyroxine  (SYNTHROID ) 50 MCG tablet TAKE 1 TABLET DAILY 08/13/23   Thapa,  [place], MD  Multiple Minerals-Vitamins (CAL-MAG-ZINC -D) TABS Take 1 tablet by mouth daily. 330 mg /130 mg/ 5 mg /5 mg    [provider]  pantoprazole  (PROTONIX ) 40 MG tablet Take 1 tablet by mouth as needed.    [provider]  Semaglutide ,0.25 or 0.5MG /DOS, (OZEMPIC , 0.25 OR 0.5 MG/DOSE,) 2 MG/1.5ML SOPN Inject 0.5 mg into the skin once a week. 10/16/23   Thapa, [place], MD  SYMBICORT  80-4.5 MCG/ACT inhaler Inhale 2 puffs into the lungs 2 (two) times daily as needed (shortness of breath). 07/03/18   Marsa Arlean BIRCH, MD  UNABLE TO FIND Med Name: Natale ION x2    [provider]    Allergies  Allergen Reactions   Cymbalta  [Duloxetine  Hcl] Diarrhea and Other (See Comments)    Dizziness, headache, irritability   Amlodipine  Besylate Swelling   Baclofen      jerks    Betadine  [Povidone Iodine ] Swelling and Other (See Comments)    SWELLING REACTION DESCRIPTION/SEVERITY UNSPECIFIED  Reaction to betadine  on eye lid and it swelled up   Erythromycin Base Other (See Comments)    unknown   Oxycodone  Hcl    Percocet [Oxycodone -Acetaminophen ] Other (See Comments)    Confusion change personality   Povidone-Iodine  Swelling   Soap Other (See Comments)    unknown   Statins Other (See Comments)    Muscle cramps   Adhesive [Tape] Rash   Lipitor [Atorvastatin] Rash    Social History   Socioeconomic History   Marital status: Divorced    Spouse name: Not on file   Number of children: 1   Years of education: Doctorate   Highest education level: Not on file  Occupational History   Occupation: Retired  Tobacco Use   Smoking status: Former    Current packs/day: 0.00    Average packs/day: 0.1 packs/day for 30.0 years (3.0 ttl pk-yrs)    Types: Cigarettes    Start date: 31    Quit date: 2005    Years since quitting: 20.8   Smokeless tobacco: Never  Vaping Use   Vaping status: Never Used  Substance and Sexual Activity   Alcohol use: Yes    Comment: wine /w dinner on  occas.   Drug use: Never   Sexual activity: Never  Other Topics Concern   Not on file  Social History Narrative   Patient drinks caffeine occasionally.   Patient is right handed.   Admitted to [facility]21[facility]17   Divorced    Former smoker - stopped 2000    Alcohol - occasionally wine at dinner   Full code   Social Drivers of Corporate Investment Banker Strain: Not on file  Food Insecurity: Not on file  Transportation Needs: Not on file  Physical Activity: Not on file  Stress: Not on file  Social Connections: Not on file  Intimate Partner Violence: Not on file    Tobacco Use: Medium Risk (01/29/2024)   Patient History    Smoking Tobacco Use: Former    Smokeless Tobacco Use: Never    Passive Exposure: Not on file   Social History   Substance and Sexual Activity  Alcohol Use Yes   Comment: wine /w dinner on occas.    Family History  Problem Relation Age of Onset   Ovarian cancer Mother    Cancer - Prostate Father    Breast cancer Paternal Aunt    Multiple myeloma Paternal Aunt    Anesthesia problems Neg Hx    Hypotension Neg Hx    Malignant hyperthermia Neg Hx    Pseudochol deficiency Neg Hx     Review of Systems  Constitutional:  Negative for chills and fever.  HENT:  Negative for congestion, sore throat and tinnitus.   Eyes:  Negative for double vision, photophobia and pain.  Respiratory:  Negative for cough, shortness of breath and wheezing.   Cardiovascular:  Negative for chest pain, palpitations and orthopnea.  Gastrointestinal:  Negative for heartburn, nausea and vomiting.  Genitourinary:  Negative for dysuria, frequency and urgency.  Musculoskeletal:  Positive for joint pain.  Neurological:  Negative for dizziness, weakness and headaches.    Objective:  Physical Exam: - Well-developed female, alert, and in no apparent distress.    - Left knee shows no warmth or effusion.    - Range of motion: 0-125 degrees.    - Tenderness:  Medial and lateral with no instability.   Imaging Review Plain radiographs demonstrate severe medial compartment degenerative joint disease of the left knee without lateral or patellofemoral involvement The overall alignment is neutral. The bone quality appears to be adequate for age and reported activity level.  Assessment/Plan:  End stage medial compartment arthritis, left knee   The patient history, physical examination, clinical judgment of the provider and imaging studies are consistent with end stage degenerative joint disease of the left knee and total knee arthroplasty is deemed medically necessary. The treatment options including medical management, injection therapy arthroscopy and arthroplasty were discussed at length. The risks and benefits of total knee arthroplasty were presented and reviewed. The risks due to aseptic loosening, infection, stiffness, patella tracking problems, thromboembolic complications and other imponderables were discussed. The patient acknowledged the explanation, agreed to proceed with the plan and consent was signed. Patient is being admitted for inpatient treatment for surgery, pain control, PT, OT, prophylactic antibiotics, VTE prophylaxis, progressive ambulation and ADLs and discharge planning. The patient is planning to be discharged home.   Therapy Plans: [facility]  Disposition: home with friend, [place]  Planned DVT Prophylaxis: 81mg  Aspirin  BID  DME needed: none PCP: Ardell Manly, MD (clearance received)  TXA: IV  Allergies: adhesive (breakout, blistering), amlodipine  (swelling), Statins (cramping), baclofen  (AMS), betadine  (eye swelling with eye sx), oxycodone  (AMS), erythromycin (unsure reaction) Anesthesia Concerns: none BMI: 30 Last HgbA1c: 6.4% on 01/27/24 Pharmacy: [facility] to bring on DC Pain regimen: Hydrocodone  (on now as needed and does  well for her), Tramadol . While in hospital she is requesting  Hydromorphone  but at home wants Hydrocodone .   Other: - On Norco half a dose as needed for back pain and knee pain*  - Staying overnight with [facility]  - NO Aquacel*  - Patient was instructed on what medications to stop prior to surgery. - Follow-up visit in 2 weeks with Dr. Melodi - Begin physical therapy following surgery - Pre-operative lab work as pre-surgical testing - Prescriptions will be provided in hospital at time of discharge  Roxie Mess, PA-C Orthopedic Surgery [facility] [place]

## 2024-03-23 ENCOUNTER — Other Ambulatory Visit: Payer: Self-pay | Admitting: Endocrinology

## 2024-03-23 DIAGNOSIS — E782 Mixed hyperlipidemia: Secondary | ICD-10-CM

## 2024-03-24 NOTE — Patient Instructions (Signed)
 SURGICAL WAITING ROOM VISITATION  Patients having surgery or a procedure may have no more than 2 support people in the waiting area - these visitors may rotate.    Children under the age of 30 must have an adult with them who is not the patient.  Visitors with respiratory illnesses are discouraged from visiting and should remain at home.  If the patient needs to stay at the hospital during part of their recovery, the visitor guidelines for inpatient rooms apply. Pre-op nurse will coordinate an appropriate time for 1 support person to accompany patient in pre-op.  This support person may not rotate.    Please refer to the [facility] website for the visitor guidelines for Inpatients (after your surgery is over and you are in a regular room).       Your procedure is scheduled on: 04/05/24   Report to [facility] Main Entrance    Report to admitting at : 11:00 AM   Call this number if you have problems the morning of surgery [phone]   Do not eat food :After Midnight.   After Midnight you may have the following liquids until: 10:30 AM DAY OF SURGERY  Water  Non-Citrus Juices (without pulp, NO RED-Apple, White grape, White cranberry) Black Coffee (NO MILK/CREAM OR CREAMERS, sugar ok)  Clear Tea (NO MILK/CREAM OR CREAMERS, sugar ok) regular and decaf                             Plain Jell-O (NO RED)                                           Fruit ices (not with fruit pulp, NO RED)                                     Popsicles (NO RED)                                                               Sports drinks like Gatorade (NO RED)   The day of surgery:  Drink ONE (1) Pre-Surgery Clear G2 at : 10:30 AM the morning of surgery. Drink in one sitting. Do not sip.  This drink was given to you during your hospital  pre-op appointment visit. Nothing else to drink after completing the  Pre-Surgery Clear Ensure or G2.          If you have questions, please contact your  surgeon's office.  FOLLOW ANY ADDITIONAL PRE OP INSTRUCTIONS YOU RECEIVED FROM YOUR SURGEON'S OFFICE!!!   Oral Hygiene is also important to reduce your risk of infection.                                    Remember - BRUSH YOUR TEETH THE MORNING OF SURGERY WITH YOUR REGULAR TOOTHPASTE  DENTURES WILL BE REMOVED PRIOR TO SURGERY PLEASE DO NOT APPLY Poly grip OR ADHESIVES!!!   Do NOT smoke after Midnight   Stop all vitamins  and herbal supplements 7 days before surgery.   Take these medicines the morning of surgery with A SIP OF WATER :gabapentin ,cetirizine ,levothyroxine ,famotidine .Use eye drops as usual.Tylenol  as needed.Use inhalers as usual and bring them.  How to Manage Your Diabetes Before and After Surgery  Why is it important to control my blood sugar before and after surgery? Improving blood sugar levels before and after surgery helps healing and can limit problems. A way of improving blood sugar control is eating a healthy diet by:  Eating less sugar and carbohydrates  Increasing activity/exercise  Talking with your doctor about reaching your blood sugar goals High blood sugars (greater than 180 mg/dL) can raise your risk of infections and slow your recovery, so you will need to focus on controlling your diabetes during the weeks before surgery. Make sure that the doctor who takes care of your diabetes knows about your planned surgery including the date and location.  How do I manage my blood sugar before surgery? Check your blood sugar at least 4 times a day, starting 2 days before surgery, to make sure that the level is not too high or low. Check your blood sugar the morning of your surgery when you wake up and every 2 hours until you get to the Short Stay unit. If your blood sugar is less than 70 mg/dL, you will need to treat for low blood sugar: Do not take insulin . Treat a low blood sugar (less than 70 mg/dL) with  cup of clear juice (cranberry or apple), 4 glucose tablets,  OR glucose gel. Recheck blood sugar in 15 minutes after treatment (to make sure it is greater than 70 mg/dL). If your blood sugar is not greater than 70 mg/dL on recheck, call [phone] for further instructions. Report your blood sugar to the short stay nurse when you get to Short Stay.  If you are admitted to the hospital after surgery: Your blood sugar will be checked by the staff and you will probably be given insulin  after surgery (instead of oral diabetes medicines) to make sure you have good blood sugar levels. The goal for blood sugar control after surgery is 80-180 mg/dL.   WHAT DO I DO ABOUT MY DIABETES MEDICATION?  HOLD farxiga  after: 04/01/24  THE NIGHT BEFORE SURGERY, DO NOT take lispro dinner dose.     THE MORNING OF SURGERY, take ONLY half of lantus  insulin  (4 units)  .If your CBG is greater than 220 mg/dL, you may take  of your sliding scale  (correction) dose of insulin .  DO NOT TAKE THE FOLLOWING 7 DAYS PRIOR TO SURGERY: Ozempic , Wegovy , Rybelsus  (Semaglutide ), Byetta (exenatide), Bydureon (exenatide ER), Victoza, Saxenda (liraglutide), or Trulicity (dulaglutide) Mounjaro (Tirzepatide) Adlyxin (Lixisenatide), Polyethylene Glycol Loxenatide. HOLD semaglutide  after: 03/28/24    DO NOT TAKE ANY ORAL DIABETIC MEDICATIONS DAY OF YOUR SURGERY  Bring CPAP mask and tubing day of surgery.                              You may not have any metal on your body including hair pins, jewelry, and body piercing             Do not wear make-up, lotions, powders, perfumes/cologne, or deodorant  Do not wear nail polish including gel and S&S, artificial/acrylic nails, or any other type of covering on natural nails including finger and toenails. If you have artificial nails, gel coating, etc. that needs to be removed by a nail salon  please have this removed prior to surgery or surgery may need to be canceled/ delayed if the surgeon/ anesthesia feels like they are unable to be safely  monitored.   Do not shave  48 hours prior to surgery.   Do not bring valuables to the hospital. [facility] IS NOT             RESPONSIBLE   FOR VALUABLES.   Contacts, glasses, dentures or bridgework may not be worn into surgery.   Bring small overnight bag day of surgery.   DO NOT BRING YOUR HOME MEDICATIONS TO THE HOSPITAL. PHARMACY WILL DISPENSE MEDICATIONS LISTED ON YOUR MEDICATION LIST TO YOU DURING YOUR ADMISSION IN THE HOSPITAL!    Patients discharged on the day of surgery will not be allowed to drive home.  Someone NEEDS to stay with you for the first 24 hours after anesthesia.   Special Instructions: Bring a copy of your healthcare power of attorney and living will documents the day of surgery if you haven't scanned them before.              Please read over the following fact sheets you were given: IF YOU HAVE QUESTIONS ABOUT YOUR PRE-OP INSTRUCTIONS PLEASE CALL [phone].   If you received a COVID test during your pre-op visit  it is requested that you wear a mask when out in public, stay away from anyone that may not be feeling well and notify your surgeon if you develop symptoms. If you test positive for Covid or have been in contact with anyone that has tested positive in the last 10 days please notify you surgeon.      Pre-operative 4 CHG Bath Instructions  DYNA-Hex 4 Chlorhexidine  Gluconate 4% Solution Antiseptic 4 fl. oz   You can play a key role in reducing the risk of infection after surgery. Your skin needs to be as free of germs as possible. You can reduce the number of germs on your skin by washing with CHG (chlorhexidine  gluconate) soap before surgery. CHG is an antiseptic soap that kills germs and continues to kill germs even after washing.   DO NOT use if you have an allergy to chlorhexidine /CHG or antibacterial soaps. If your skin becomes reddened or irritated, stop using the CHG and notify one of our RNs at   Please shower with the CHG soap starting 4 days  before surgery using the following schedule:     Please keep in mind the following:  DO NOT shave, including legs and underarms, starting the day of your first shower.   You may shave your face at any point before/day of surgery.  Place clean sheets on your bed the day you start using CHG soap. Use a clean washcloth (not used since being washed) for each shower. DO NOT sleep with pets once you start using the CHG.  CHG Shower Instructions:  If you choose to wash your hair and private area, wash first with your normal shampoo/soap.  After you use shampoo/soap, rinse your hair and body thoroughly to remove shampoo/soap residue.  Turn the water  OFF and apply about 3 tablespoons (45 ml) of CHG soap to a CLEAN washcloth.  Apply CHG soap ONLY FROM YOUR NECK DOWN TO YOUR TOES (washing for 3-5 minutes)  DO NOT use CHG soap on face, private areas, open wounds, or sores.  Pay special attention to the area where your surgery is being performed.  If you are having back surgery, having someone wash your back  for you may be helpful. Wait 2 minutes after CHG soap is applied, then you may rinse off the CHG soap.  Pat dry with a clean towel  Put on clean clothes/pajamas   If you choose to wear lotion, please use ONLY the CHG-compatible lotions on the back of this paper.     Additional instructions for the day of surgery: DO NOT APPLY any lotions, deodorants, cologne, or perfumes.   Put on clean/comfortable clothes.  Brush your teeth.  Ask your nurse before applying any prescription medications to the skin.   CHG Compatible Lotions   Aveeno Moisturizing lotion  Cetaphil Moisturizing Cream  Cetaphil Moisturizing Lotion  Clairol Herbal Essence Moisturizing Lotion, Dry Skin  Clairol Herbal Essence Moisturizing Lotion, Extra Dry Skin  Clairol Herbal Essence Moisturizing Lotion, Normal Skin  Curel Age Defying Therapeutic Moisturizing Lotion with Alpha Hydroxy  Curel Extreme Care Body Lotion  Curel  Soothing Hands Moisturizing Hand Lotion  Curel Therapeutic Moisturizing Cream, Fragrance-Free  Curel Therapeutic Moisturizing Lotion, Fragrance-Free  Curel Therapeutic Moisturizing Lotion, Original Formula  Eucerin Daily Replenishing Lotion  Eucerin Dry Skin Therapy Plus Alpha Hydroxy Crme  Eucerin Dry Skin Therapy Plus Alpha Hydroxy Lotion  Eucerin Original Crme  Eucerin Original Lotion  Eucerin Plus Crme Eucerin Plus Lotion  Eucerin TriLipid Replenishing Lotion  Keri Anti-Bacterial Hand Lotion  Keri Deep Conditioning Original Lotion Dry Skin Formula Softly Scented  Keri Deep Conditioning Original Lotion, Fragrance Free Sensitive Skin Formula  Keri Lotion Fast Absorbing Fragrance Free Sensitive Skin Formula  Keri Lotion Fast Absorbing Softly Scented Dry Skin Formula  Keri Original Lotion  Keri Skin Renewal Lotion Keri Silky Smooth Lotion  Keri Silky Smooth Sensitive Skin Lotion  Nivea Body Creamy Conditioning Oil  Nivea Body Extra Enriched Lotion  Nivea Body Original Lotion  Nivea Body Sheer Moisturizing Lotion Nivea Crme  Nivea Skin Firming Lotion  NutraDerm 30 Skin Lotion  NutraDerm Skin Lotion  NutraDerm Therapeutic Skin Cream  NutraDerm Therapeutic Skin Lotion  ProShield Protective Hand Cream  Provon moisturizing lotion  Incentive Spirometer  An incentive spirometer is a tool that can help keep your lungs clear and active. This tool measures how well you are filling your lungs with each breath. Taking long deep breaths may help reverse or decrease the chance of developing breathing (pulmonary) problems (especially infection) following: A long period of time when you are unable to move or be active. BEFORE THE PROCEDURE  If the spirometer includes an indicator to show your best effort, your nurse or respiratory therapist will set it to a desired goal. If possible, sit up straight or lean slightly forward. Try not to slouch. Hold the incentive spirometer in an upright  position. INSTRUCTIONS FOR USE  Sit on the edge of your bed if possible, or sit up as far as you can in bed or on a chair. Hold the incentive spirometer in an upright position. Breathe out normally. Place the mouthpiece in your mouth and seal your lips tightly around it. Breathe in slowly and as deeply as possible, raising the piston or the ball toward the top of the column. Hold your breath for 3-5 seconds or for as long as possible. Allow the piston or ball to fall to the bottom of the column. Remove the mouthpiece from your mouth and breathe out normally. Rest for a few seconds and repeat Steps 1 through 7 at least 10 times every 1-2 hours when you are awake. Take your time and take a few normal breaths  between deep breaths. The spirometer may include an indicator to show your best effort. Use the indicator as a goal to work toward during each repetition. After each set of 10 deep breaths, practice coughing to be sure your lungs are clear. If you have an incision (the cut made at the time of surgery), support your incision when coughing by placing a pillow or rolled up towels firmly against it. Once you are able to get out of bed, walk around indoors and cough well. You may stop using the incentive spirometer when instructed by your caregiver.  RISKS AND COMPLICATIONS Take your time so you do not get dizzy or light-headed. If you are in pain, you may need to take or ask for pain medication before doing incentive spirometry. It is harder to take a deep breath if you are having pain. AFTER USE Rest and breathe slowly and easily. It can be helpful to keep track of a log of your progress. Your caregiver can provide you with a simple table to help with this. If you are using the spirometer at home, follow these instructions: SEEK MEDICAL CARE IF:  You are having difficultly using the spirometer. You have trouble using the spirometer as often as instructed. Your pain medication is not giving  enough relief while using the spirometer. You develop fever of 100.5 F (38.1 C) or higher. SEEK IMMEDIATE MEDICAL CARE IF:  You cough up bloody sputum that had not been present before. You develop fever of 102 F (38.9 C) or greater. You develop worsening pain at or near the incision site. MAKE SURE YOU:  Understand these instructions. Will watch your condition. Will get help right away if you are not doing well or get worse. Document Released: 08/19/2006 Document Revised: 07/01/2011 Document Reviewed: 10/20/2006 [facility] Patient Information 2014 [place], [place].   ________________________________________________________________________

## 2024-03-25 ENCOUNTER — Encounter (HOSPITAL_COMMUNITY): Payer: Self-pay

## 2024-03-25 ENCOUNTER — Other Ambulatory Visit: Payer: Self-pay

## 2024-03-25 ENCOUNTER — Encounter (HOSPITAL_COMMUNITY)
Admission: RE | Admit: 2024-03-25 | Discharge: 2024-03-25 | Disposition: A | Source: Ambulatory Visit | Attending: Orthopedic Surgery

## 2024-03-25 VITALS — BP 126/76 | HR 71 | Temp 98.5°F | Ht 63.0 in | Wt 164.0 lb

## 2024-03-25 DIAGNOSIS — J45909 Unspecified asthma, uncomplicated: Secondary | ICD-10-CM | POA: Insufficient documentation

## 2024-03-25 DIAGNOSIS — M810 Age-related osteoporosis without current pathological fracture: Secondary | ICD-10-CM | POA: Insufficient documentation

## 2024-03-25 DIAGNOSIS — E039 Hypothyroidism, unspecified: Secondary | ICD-10-CM | POA: Diagnosis not present

## 2024-03-25 DIAGNOSIS — G4730 Sleep apnea, unspecified: Secondary | ICD-10-CM | POA: Insufficient documentation

## 2024-03-25 DIAGNOSIS — Z87891 Personal history of nicotine dependence: Secondary | ICD-10-CM | POA: Insufficient documentation

## 2024-03-25 DIAGNOSIS — E1140 Type 2 diabetes mellitus with diabetic neuropathy, unspecified: Secondary | ICD-10-CM | POA: Diagnosis not present

## 2024-03-25 DIAGNOSIS — I10 Essential (primary) hypertension: Secondary | ICD-10-CM

## 2024-03-25 DIAGNOSIS — Z79890 Hormone replacement therapy (postmenopausal): Secondary | ICD-10-CM | POA: Insufficient documentation

## 2024-03-25 DIAGNOSIS — Z01812 Encounter for preprocedural laboratory examination: Secondary | ICD-10-CM | POA: Diagnosis present

## 2024-03-25 DIAGNOSIS — Z794 Long term (current) use of insulin: Secondary | ICD-10-CM | POA: Insufficient documentation

## 2024-03-25 DIAGNOSIS — Z01818 Encounter for other preprocedural examination: Secondary | ICD-10-CM

## 2024-03-25 LAB — CBC
HCT: 42.9 % (ref 36.0–46.0)
Hemoglobin: 13.9 g/dL (ref 12.0–15.0)
MCH: 27.9 pg (ref 26.0–34.0)
MCHC: 32.4 g/dL (ref 30.0–36.0)
MCV: 86 fL (ref 80.0–100.0)
Platelets: 212 K/uL (ref 150–400)
RBC: 4.99 MIL/uL (ref 3.87–5.11)
RDW: 14.1 % (ref 11.5–15.5)
WBC: 6.3 K/uL (ref 4.0–10.5)
nRBC: 0 % (ref 0.0–0.2)

## 2024-03-25 LAB — BASIC METABOLIC PANEL WITH GFR
Anion gap: 12 (ref 5–15)
BUN: 16 mg/dL (ref 8–23)
CO2: 25 mmol/L (ref 22–32)
Calcium: 9.5 mg/dL (ref 8.9–10.3)
Chloride: 102 mmol/L (ref 98–111)
Creatinine, Ser: 1.02 mg/dL — ABNORMAL HIGH (ref 0.44–1.00)
GFR, Estimated: 54 mL/min — ABNORMAL LOW (ref >60)
Glucose, Bld: 147 mg/dL — ABNORMAL HIGH (ref 70–99)
Potassium: 3.5 mmol/L (ref 3.5–5.1)
Sodium: 140 mmol/L (ref 135–145)

## 2024-03-25 LAB — SURGICAL PCR SCREEN
MRSA, PCR: NEGATIVE
Staphylococcus aureus: NEGATIVE

## 2024-03-25 LAB — GLUCOSE, CAPILLARY: Glucose-Capillary: 140 mg/dL — ABNORMAL HIGH (ref 70–99)

## 2024-03-25 NOTE — Progress Notes (Addendum)
 For Anesthesia: PCP - Husain, Karrar, MD  Cardiologist - Michele, Sunit, DO LOV: 01/20/24: Clearance.  Bowel Prep reminder:  Chest x-ray - 01/02/24 EKG - 01/02/24 Stress Test - 07/09/21 ECHO - 09/04/22 Cardiac Cath -  Pacemaker/ICD device last checked: Pacemaker orders received: Device Rep notified:  Spinal Cord Stimulator:N/A  Sleep Study - Yes CPAP - NO  Fasting Blood Sugar - 100's Checks Blood Sugar : Dexcom 7 Date and result of last Hgb A1c-6.4: 01/27/24  Last dose of GLP1 agonist- semaglutide : To hold after: 03/28/24 GLP1 instructions: Hold 7 days prior to schedule (Hold 24 hours-daily)   Last dose of SGLT-2 inhibitors- farxiga : To hold after: 04/01/24 SGLT-2 instructions: Hold 72 hours prior to surgery  Blood Thinner Instructions:N/A Last Dose: Time last taken:  Aspirin  Instructions:N/A Last Dose: Time last taken:  Activity level: Can go up a flight of stairs and activities of daily living without stopping and without chest pain and/or shortness of breath   Able to exercise without chest pain and/or shortness of breath  Anesthesia review: Hx: HTN,DIA,Murmur,OSA(NO CPAP).  Patient denies shortness of breath, fever, cough and chest pain at PAT appointment   Patient verbalized understanding of instructions that were reviewed over the telephone.

## 2024-03-26 NOTE — Progress Notes (Signed)
 Anesthesia Chart Review   Case: [id number] Date/Time: 04/05/24 1325   Procedure: ARTHROPLASTY, KNEE, UNICOMPARTMENTAL (Left: Knee) - Left medial unicompartmental arthroplasty   Anesthesia type: Choice   Pre-op diagnosis: Left medial compartment osteoarthritis   Location: [facility] ROOM 09 / [facility]   Surgeons: Melodi Lerner, MD       DISCUSSION:83 y.o. former smoker with h/o sleep apnea no cpap, asthma, HTN, hypothyroidism on Synthroid , DM II (A1C 6.4), left medial compartment OA scheduled for above procedure 04/05/2024 with Dr. Lerner Melodi.   Per cardiology preoperative evaluation 01/20/2024, Cardiology has not been asked to formally provide recommendations for her upcoming left knee surgery. However we did discuss her preoperative risk assessment According to the Revised Cardiac Risk Index (RCRI), her Perioperative Risk of Major Cardiac Event is (%): 0.9 Her Functional Capacity in METs is: 5.07 according to the Duke Activity Status Index (DASI). From a cardiovascular standpoint she is optimized and no additional testing is warranted.   Patient is considered to be acceptable risk for upcoming noncardiac surgery. EKG is nonischemic. An echocardiogram notes preserved LVEF. As long as there is no change in clinical status between now and her surgery do not anticipate any additional workup.  However if there is a change patient is advised to call us  back for further guidance. Of note, Repatha  and Ozempic  are currently being refilled by endocrinology  Pt advised to hold Ozempic  1 week prior to procedure.   VS: BP 126/76   Pulse 71   Temp 36.9 C (Oral)   Ht 5' 3 (1.6 m)   Wt 74.4 kg   LMP  (LMP Unknown)   SpO2 96%   BMI 29.05 kg/m   PROVIDERS: Ransom Other, MD is PCP   Cardiologist - Michele Richardson, DO  LABS: Labs reviewed: Acceptable for surgery. (all labs ordered are listed, but only abnormal results are displayed)  Labs Reviewed  BASIC METABOLIC PANEL WITH GFR - Abnormal;  Notable for the following components:      Result Value   Glucose, Bld 147 (*)    Creatinine, Ser 1.02 (*)    GFR, Estimated 54 (*)    All other components within normal limits  GLUCOSE, CAPILLARY - Abnormal; Notable for the following components:   Glucose-Capillary 140 (*)    All other components within normal limits  SURGICAL PCR SCREEN  CBC     IMAGES:   EKG:   CV: Echocardiogram 09/03/2022:  Normal LV systolic function with visual EF 60-65%. Left ventricle cavity  is normal in size. Normal left ventricular wall thickness. Normal global  wall motion. Normal diastolic filling pattern, normal LAP. Calculated EF  62%.  Trileaflet aortic valve with no regurgitation. Mild aortic valve leaflet  calcification.  Structurally normal tricuspid valve with trace regurgitation. No evidence  of pulmonary hypertension.  No prior available for comparison.   Lexiscan  Nuclear stress test 07/09/2021:  Nondiagnostic ECG stress. The heart rate response was consistent with Lexiscan.  Myocardial perfusion is normal. Overall LV systolic function is normal without regional wall motion abnormalities. Stress LV EF: 79%.  No previous exam available for comparison. Low risk.    Past Medical History:  Diagnosis Date   Acute blood loss anemia    Acute encephalopathy 07/15/2016   AKI (acute kidney injury)    Anemia    has sickle cell trait   Anxiety    Arthritis    Asthma    has used inhaler in past for asthmatic bronchitis, last time- early 2012  Bilateral primary osteoarthritis of knee 03/26/2016   Complication of anesthesia    wakes up shaking   Diabetes mellitus    Diabetes mellitus with neuropathy (HCC) 10/29/2012   Diverticulitis    Dyslipidemia    Dyspnea    with exertion   Encephalopathy 06/2016   due to medications after surgery   Fibromyalgia    GERD (gastroesophageal reflux disease)    occas. use of  Prilosec   Heart murmur    sees Dr. Andria, last seen- early 2012    Herniated nucleus pulposus, L2-3 06/25/2016   History of back surgery    Hypertension    02/2010- stress test /w PCP   Hypothyroidism    Loose bowel movements 12/2016   Lumbar radiculopathy 06/27/2016   Lumbar stenosis with neurogenic claudication 03/17/2017   Memory disorder 02/16/2016   Myoclonic jerking    Neuromuscular disorder (HCC)    lumbar radiculopathy, lumbago   Nocturnal leg cramps 09/27/2014   Osteoporosis 03/10/2014   Pneumonia    Sickle cell trait    Sleep apnea    borderline sleep apnea, states she no longer uses, early 2012- stopped using    Spinal stenosis of lumbar region with radiculopathy 02/28/2014   Spondylolisthesis of lumbar region 03/19/2011   Type II or unspecified type diabetes mellitus without mention of complication, uncontrolled 07/08/2013    Past Surgical History:  Procedure Laterality Date   ABDOMINAL HYSTERECTOMY     adb.cyst     ovarian cyst   BACK SURGERY     2012, 2015 (3 total)   BACK SURGERY  2018   02/24/2017   COLONOSCOPY     EYE SURGERY     macular degeneration treatment - injections   FEMUR IM NAIL Left 06/13/2018   Procedure: INTRAMEDULLARY (IM) NAIL FEMORAL;  Surgeon: Vernetta Lonni GRADE, MD;  Location: [facility];  Service: Orthopedics;  Laterality: Left;   OVARIAN CYST SURGERY     POSTERIOR LUMBAR FUSION 4 LEVEL N/A 03/17/2017   Procedure: Decompression of Lumbar One-Two with Thoracic Ten to Lumbar Two Fusion;  Surgeon: Colon Shove, MD;  Location: [facility] OR;  Service: Neurosurgery;  Laterality: N/A;  Decompression of L1-2 with T10 to L2 Fusion   TOTAL KNEE ARTHROPLASTY Right 08/13/2019   Procedure: RIGHT TOTAL KNEE ARTHROPLASTY;  Surgeon: Vernetta Lonni GRADE, MD;  Location: [facility];  Service: Orthopedics;  Laterality: Right;   TOTAL KNEE REVISION Right 07/05/2020   Procedure: TOTAL KNEE REVISION;  Surgeon: Melodi Lerner, MD;  Location: [facility];  Service: Orthopedics;  Laterality: Right;     MEDICATIONS:  polyethylene glycol  powder (GLYCOLAX /MIRALAX ) 17 GM/SCOOP powder   acetaminophen  (TYLENOL ) 500 MG tablet   amitriptyline (ELAVIL) 25 MG tablet   Artificial Tear Solution (SOOTHE XP OP)   Carboxymethylcell-Hypromellose 0.25-0.3 % GEL   cetirizine  (ZYRTEC ) 10 MG tablet   Continuous Glucose Sensor (DEXCOM G7 SENSOR) MISC   dapagliflozin  propanediol (FARXIGA ) 5 MG TABS tablet   Evolocumab  (REPATHA  SURECLICK) 140 MG/ML SOAJ   famotidine  (PEPCID ) 40 MG tablet   fluticasone  (FLONASE ) 50 MCG/ACT nasal spray   gabapentin  (NEURONTIN ) 600 MG tablet   HYDROcodone -acetaminophen  (NORCO/VICODIN) 5-325 MG tablet   insulin  glargine (LANTUS ) 100 UNIT/ML Solostar Pen   insulin  lispro (HUMALOG  KWIKPEN) 100 UNIT/ML KwikPen   Insulin  Pen Needle (BD PEN NEEDLE NANO U/F) 32G X 4 MM MISC   irbesartan -hydrochlorothiazide  (AVALIDE) 150-12.5 MG tablet   levothyroxine  (SYNTHROID ) 50 MCG tablet   MAGNESIUM  CITRATE PO   Magnesium  Oxide -Mg Supplement (  RA MAGNESIUM ) 500 MG CAPS   Multiple Minerals-Vitamins (CAL-MAG-ZINC -D) TABS   POTASSIUM GLUCONATE PO   Semaglutide ,0.25 or 0.5MG /DOS, (OZEMPIC , 0.25 OR 0.5 MG/DOSE,) 2 MG/1.5ML SOPN   SYMBICORT  80-4.5 MCG/ACT inhaler   TURMERIC PO   No current facility-administered medications for this encounter.     Harlene Hoots Ward, PA-C [facility] [phone]

## 2024-03-26 NOTE — Anesthesia Preprocedure Evaluation (Addendum)
 Anesthesia Evaluation  Patient identified by MRN, date of birth, ID band Patient awake    Reviewed: Allergy & Precautions, NPO status , Patient's Chart, lab work & pertinent test results  Airway Mallampati: II  TM Distance: >3 FB Neck ROM: Full    Dental no notable dental hx.    Pulmonary asthma , sleep apnea , former smoker   Pulmonary exam normal        Cardiovascular hypertension, Pt. on medications Normal cardiovascular exam     Neuro/Psych   Anxiety      Neuromuscular disease    GI/Hepatic negative GI ROS, Neg liver ROS,,,  Endo/Other  diabetes, Insulin  DependentHypothyroidism  Patient on GLP-1 Agonist  Renal/GU Renal disease     Musculoskeletal  (+) Arthritis ,  Fibromyalgia -Lumbar spine surgery x 5   Abdominal   Peds  Hematology  (+) Blood dyscrasia, Sickle cell trait   Anesthesia Other Findings Left medial compartment osteoarthritis  Reproductive/Obstetrics                              Anesthesia Physical Anesthesia Plan  ASA: 3  Anesthesia Plan: Regional and General   Post-op Pain Management: Regional block*   Induction: Intravenous  PONV Risk Score and Plan: 3 and Ondansetron , Dexamethasone  and Treatment may vary due to age or medical condition  Airway Management Planned: LMA  Additional Equipment:   Intra-op Plan:   Post-operative Plan: Extubation in OR  Informed Consent: I have reviewed the patients History and Physical, chart, labs and discussed the procedure including the risks, benefits and alternatives for the proposed anesthesia with the patient or authorized representative who has indicated his/her understanding and acceptance.     Dental advisory given  Plan Discussed with: CRNA  Anesthesia Plan Comments: (PAT note 03/25/2024)         Anesthesia Quick Evaluation

## 2024-04-05 ENCOUNTER — Observation Stay (HOSPITAL_COMMUNITY)
Admission: RE | Admit: 2024-04-05 | Discharge: 2024-04-06 | Disposition: A | Attending: Orthopedic Surgery | Admitting: Orthopedic Surgery

## 2024-04-05 ENCOUNTER — Encounter (HOSPITAL_COMMUNITY): Payer: Self-pay | Admitting: Medical

## 2024-04-05 ENCOUNTER — Encounter (HOSPITAL_COMMUNITY): Payer: Self-pay | Admitting: Orthopedic Surgery

## 2024-04-05 ENCOUNTER — Other Ambulatory Visit (HOSPITAL_COMMUNITY): Payer: Self-pay

## 2024-04-05 ENCOUNTER — Telehealth (HOSPITAL_COMMUNITY): Payer: Self-pay

## 2024-04-05 ENCOUNTER — Other Ambulatory Visit: Payer: Self-pay

## 2024-04-05 ENCOUNTER — Encounter (HOSPITAL_COMMUNITY): Admission: RE | Disposition: A | Payer: Self-pay | Source: Ambulatory Visit | Attending: Orthopedic Surgery

## 2024-04-05 ENCOUNTER — Ambulatory Visit (HOSPITAL_COMMUNITY): Admitting: Anesthesiology

## 2024-04-05 DIAGNOSIS — M1712 Unilateral primary osteoarthritis, left knee: Secondary | ICD-10-CM | POA: Diagnosis present

## 2024-04-05 DIAGNOSIS — E1140 Type 2 diabetes mellitus with diabetic neuropathy, unspecified: Secondary | ICD-10-CM | POA: Diagnosis not present

## 2024-04-05 DIAGNOSIS — Z794 Long term (current) use of insulin: Secondary | ICD-10-CM | POA: Diagnosis not present

## 2024-04-05 DIAGNOSIS — J45909 Unspecified asthma, uncomplicated: Secondary | ICD-10-CM | POA: Diagnosis not present

## 2024-04-05 DIAGNOSIS — I10 Essential (primary) hypertension: Secondary | ICD-10-CM | POA: Diagnosis not present

## 2024-04-05 DIAGNOSIS — Z96651 Presence of right artificial knee joint: Secondary | ICD-10-CM | POA: Diagnosis not present

## 2024-04-05 DIAGNOSIS — Z79899 Other long term (current) drug therapy: Secondary | ICD-10-CM | POA: Diagnosis not present

## 2024-04-05 DIAGNOSIS — E039 Hypothyroidism, unspecified: Secondary | ICD-10-CM | POA: Diagnosis not present

## 2024-04-05 DIAGNOSIS — Z7985 Long-term (current) use of injectable non-insulin antidiabetic drugs: Secondary | ICD-10-CM | POA: Diagnosis not present

## 2024-04-05 DIAGNOSIS — Z87891 Personal history of nicotine dependence: Secondary | ICD-10-CM | POA: Diagnosis not present

## 2024-04-05 HISTORY — PX: PARTIAL KNEE ARTHROPLASTY: SHX2174

## 2024-04-05 LAB — GLUCOSE, CAPILLARY
Glucose-Capillary: 110 mg/dL — ABNORMAL HIGH (ref 70–99)
Glucose-Capillary: 147 mg/dL — ABNORMAL HIGH (ref 70–99)
Glucose-Capillary: 202 mg/dL — ABNORMAL HIGH (ref 70–99)
Glucose-Capillary: 280 mg/dL — ABNORMAL HIGH (ref 70–99)
Glucose-Capillary: 310 mg/dL — ABNORMAL HIGH (ref 70–99)

## 2024-04-05 SURGERY — ARTHROPLASTY, KNEE, UNICOMPARTMENTAL
Anesthesia: Regional | Site: Knee | Laterality: Left

## 2024-04-05 MED ORDER — LACTATED RINGERS IV SOLN: INTRAVENOUS | Status: DC

## 2024-04-05 MED ORDER — TRANEXAMIC ACID-NACL 1000-0.7 MG/100ML-% IV SOLN
1000.0000 mg | INTRAVENOUS | Status: AC
  Administered 2024-04-05: 08:00:00 1000 mg via INTRAVENOUS
  Filled 2024-04-05: qty 100

## 2024-04-05 MED ORDER — INSULIN ASPART 100 UNIT/ML IJ SOLN: 0.0000 [IU] | INTRAMUSCULAR | Status: DC | PRN

## 2024-04-05 MED ORDER — INSULIN ASPART 100 UNIT/ML IJ SOLN
0.0000 [IU] | Freq: Every day | INTRAMUSCULAR | Status: DC
  Administered 2024-04-05: 23:00:00 4 [IU] via SUBCUTANEOUS
  Filled 2024-04-05: qty 4

## 2024-04-05 MED ORDER — DOCUSATE SODIUM 100 MG PO CAPS
100.0000 mg | ORAL_CAPSULE | Freq: Two times a day (BID) | ORAL | Status: DC
  Administered 2024-04-05 – 2024-04-06 (×2): 100 mg via ORAL
  Filled 2024-04-05 (×2): qty 1

## 2024-04-05 MED ORDER — SODIUM CHLORIDE 0.9 % IR SOLN
Status: DC | PRN
  Administered 2024-04-05: 08:00:00 1000 mL

## 2024-04-05 MED ORDER — FENTANYL CITRATE (PF) 50 MCG/ML IJ SOSY
25.0000 ug | PREFILLED_SYRINGE | INTRAMUSCULAR | Status: DC | PRN
  Administered 2024-04-05 (×2): 50 ug via INTRAVENOUS

## 2024-04-05 MED ORDER — INSULIN ASPART 100 UNIT/ML IJ SOLN
0.0000 [IU] | Freq: Three times a day (TID) | INTRAMUSCULAR | Status: DC
  Administered 2024-04-05: 12:00:00 5 [IU] via SUBCUTANEOUS
  Administered 2024-04-05: 17:00:00 8 [IU] via SUBCUTANEOUS
  Administered 2024-04-06: 08:00:00 3 [IU] via SUBCUTANEOUS
  Filled 2024-04-05: qty 8
  Filled 2024-04-05: qty 5
  Filled 2024-04-05: qty 3

## 2024-04-05 MED ORDER — FENTANYL CITRATE (PF) 100 MCG/2ML IJ SOLN
INTRAMUSCULAR | Status: AC
  Filled 2024-04-05: qty 2

## 2024-04-05 MED ORDER — CEFAZOLIN SODIUM-DEXTROSE 2-4 GM/100ML-% IV SOLN
2.0000 g | Freq: Four times a day (QID) | INTRAVENOUS | Status: AC
  Administered 2024-04-05 (×2): 2 g via INTRAVENOUS
  Filled 2024-04-05 (×2): qty 100

## 2024-04-05 MED ORDER — PROPOFOL 10 MG/ML IV BOLUS
INTRAVENOUS | Status: AC
  Filled 2024-04-05: qty 20

## 2024-04-05 MED ORDER — IRBESARTAN 150 MG PO TABS
150.0000 mg | ORAL_TABLET | Freq: Every day | ORAL | Status: DC
  Administered 2024-04-06: 10:00:00 150 mg via ORAL
  Filled 2024-04-05: qty 1

## 2024-04-05 MED ORDER — HYDROMORPHONE HCL 1 MG/ML IJ SOLN: 0.5000 mg | INTRAMUSCULAR | Status: DC | PRN

## 2024-04-05 MED ORDER — GABAPENTIN 300 MG PO CAPS
600.0000 mg | ORAL_CAPSULE | Freq: Three times a day (TID) | ORAL | Status: DC
  Administered 2024-04-05 – 2024-04-06 (×3): 600 mg via ORAL
  Filled 2024-04-05 (×3): qty 2

## 2024-04-05 MED ORDER — FAMOTIDINE 20 MG PO TABS: 40.0000 mg | ORAL_TABLET | Freq: Every morning | ORAL | Status: DC

## 2024-04-05 MED ORDER — ONDANSETRON HCL 4 MG/2ML IJ SOLN: 4.0000 mg | Freq: Four times a day (QID) | INTRAMUSCULAR | Status: DC | PRN

## 2024-04-05 MED ORDER — BUPIVACAINE LIPOSOME 1.3 % IJ SUSP
INTRAMUSCULAR | Status: AC
  Filled 2024-04-05: qty 20

## 2024-04-05 MED ORDER — MENTHOL 3 MG MT LOZG: 1.0000 | LOZENGE | OROMUCOSAL | Status: DC | PRN

## 2024-04-05 MED ORDER — STERILE WATER FOR IRRIGATION IR SOLN
Status: DC | PRN
  Administered 2024-04-05: 08:00:00 2000 mL

## 2024-04-05 MED ORDER — POLYETHYLENE GLYCOL 3350 17 G PO PACK: 17.0000 g | PACK | Freq: Every day | ORAL | Status: DC | PRN

## 2024-04-05 MED ORDER — ONDANSETRON HCL 4 MG PO TABS: 4.0000 mg | ORAL_TABLET | Freq: Four times a day (QID) | ORAL | Status: DC | PRN

## 2024-04-05 MED ORDER — SODIUM CHLORIDE (PF) 0.9 % IJ SOLN
INTRAMUSCULAR | Status: AC
  Filled 2024-04-05: qty 10

## 2024-04-05 MED ORDER — PHENYLEPHRINE HCL (PRESSORS) 10 MG/ML IV SOLN
INTRAVENOUS | Status: AC
  Filled 2024-04-05: qty 1

## 2024-04-05 MED ORDER — AMISULPRIDE (ANTIEMETIC) 5 MG/2ML IV SOLN: 10.0000 mg | Freq: Once | INTRAVENOUS | Status: DC | PRN

## 2024-04-05 MED ORDER — BISACODYL 10 MG RE SUPP: 10.0000 mg | Freq: Every day | RECTAL | Status: DC | PRN

## 2024-04-05 MED ORDER — ONDANSETRON HCL 4 MG/2ML IJ SOLN: 4.0000 mg | Freq: Once | INTRAMUSCULAR | Status: DC | PRN

## 2024-04-05 MED ORDER — ACETAMINOPHEN 10 MG/ML IV SOLN
1000.0000 mg | Freq: Four times a day (QID) | INTRAVENOUS | Status: DC
  Administered 2024-04-05: 08:00:00 1000 mg via INTRAVENOUS
  Filled 2024-04-05: qty 100

## 2024-04-05 MED ORDER — METOCLOPRAMIDE HCL 5 MG PO TABS: 5.0000 mg | ORAL_TABLET | Freq: Three times a day (TID) | ORAL | Status: DC | PRN

## 2024-04-05 MED ORDER — BUPIVACAINE-EPINEPHRINE (PF) 0.5% -1:200000 IJ SOLN
INTRAMUSCULAR | Status: DC | PRN
  Administered 2024-04-05: 07:00:00 25 mL via PERINEURAL

## 2024-04-05 MED ORDER — LEVOTHYROXINE SODIUM 50 MCG PO TABS
50.0000 ug | ORAL_TABLET | Freq: Every day | ORAL | Status: DC
  Administered 2024-04-06: 06:00:00 50 ug via ORAL
  Filled 2024-04-05: qty 1

## 2024-04-05 MED ORDER — FLEET ENEMA RE ENEM: 1.0000 | ENEMA | Freq: Once | RECTAL | Status: DC | PRN

## 2024-04-05 MED ORDER — CHLORHEXIDINE GLUCONATE 0.12 % MT SOLN: 15.0000 mL | Freq: Once | OROMUCOSAL | Status: DC

## 2024-04-05 MED ORDER — SODIUM CHLORIDE (PF) 0.9 % IJ SOLN
INTRAMUSCULAR | Status: AC
  Filled 2024-04-05: qty 30

## 2024-04-05 MED ORDER — BUPIVACAINE LIPOSOME 1.3 % IJ SUSP: 20.0000 mL | Freq: Once | INTRAMUSCULAR | Status: DC

## 2024-04-05 MED ORDER — IRBESARTAN-HYDROCHLOROTHIAZIDE 150-12.5 MG PO TABS: 1.0000 | ORAL_TABLET | Freq: Every day | ORAL | Status: DC

## 2024-04-05 MED ORDER — ORAL CARE MOUTH RINSE: 15.0000 mL | Freq: Once | OROMUCOSAL | Status: DC

## 2024-04-05 MED ORDER — ONDANSETRON HCL 4 MG/2ML IJ SOLN
INTRAMUSCULAR | Status: AC
  Filled 2024-04-05: qty 2

## 2024-04-05 MED ORDER — PROPOFOL 500 MG/50ML IV EMUL
INTRAVENOUS | Status: DC | PRN
  Administered 2024-04-05: 08:00:00 150 ug/kg/min via INTRAVENOUS
  Administered 2024-04-05: 08:00:00 150 mg via INTRAVENOUS

## 2024-04-05 MED ORDER — FLUTICASONE FUROATE-VILANTEROL 100-25 MCG/ACT IN AEPB
1.0000 | INHALATION_SPRAY | Freq: Every day | RESPIRATORY_TRACT | Status: DC
  Administered 2024-04-05 – 2024-04-06 (×2): 1 via RESPIRATORY_TRACT
  Filled 2024-04-05: qty 28

## 2024-04-05 MED ORDER — LIDOCAINE HCL (PF) 2 % IJ SOLN
INTRAMUSCULAR | Status: DC | PRN
  Administered 2024-04-05: 08:00:00 60 mg via INTRADERMAL

## 2024-04-05 MED ORDER — AMITRIPTYLINE HCL 50 MG PO TABS: 25.0000 mg | ORAL_TABLET | Freq: Every evening | ORAL | Status: DC | PRN

## 2024-04-05 MED ORDER — METOCLOPRAMIDE HCL 5 MG/ML IJ SOLN: 5.0000 mg | Freq: Three times a day (TID) | INTRAMUSCULAR | Status: DC | PRN

## 2024-04-05 MED ORDER — POTASSIUM CHLORIDE CRYS ER 10 MEQ PO TBCR: 10.0000 meq | EXTENDED_RELEASE_TABLET | Freq: Every evening | ORAL | Status: DC | PRN

## 2024-04-05 MED ORDER — FAMOTIDINE 20 MG PO TABS
20.0000 mg | ORAL_TABLET | Freq: Every morning | ORAL | Status: DC
  Administered 2024-04-06: 06:00:00 20 mg via ORAL
  Filled 2024-04-05: qty 1

## 2024-04-05 MED ORDER — PHENOL 1.4 % MT LIQD: 1.0000 | OROMUCOSAL | Status: DC | PRN

## 2024-04-05 MED ORDER — DIPHENHYDRAMINE HCL 12.5 MG/5ML PO ELIX: 12.5000 mg | ORAL_SOLUTION | ORAL | Status: DC | PRN

## 2024-04-05 MED ORDER — POTASSIUM GLUCONATE 595 MG PO CAPS: 650.0000 mg | ORAL_CAPSULE | Freq: Every evening | ORAL | Status: DC | PRN

## 2024-04-05 MED ORDER — SODIUM CHLORIDE (PF) 0.9 % IJ SOLN
INTRAMUSCULAR | Status: DC | PRN
  Administered 2024-04-05: 08:00:00 50 mL

## 2024-04-05 MED ORDER — SODIUM CHLORIDE 0.9 % IV SOLN: INTRAVENOUS | Status: DC

## 2024-04-05 MED ORDER — PROPOFOL 1000 MG/100ML IV EMUL
INTRAVENOUS | Status: AC
  Filled 2024-04-05: qty 100

## 2024-04-05 MED ORDER — METHOCARBAMOL 500 MG PO TABS: 500.0000 mg | ORAL_TABLET | Freq: Four times a day (QID) | ORAL | Status: DC | PRN

## 2024-04-05 MED ORDER — ACETAMINOPHEN 500 MG PO TABS
1000.0000 mg | ORAL_TABLET | Freq: Four times a day (QID) | ORAL | Status: AC
  Administered 2024-04-05 – 2024-04-06 (×3): 1000 mg via ORAL
  Filled 2024-04-05 (×4): qty 2

## 2024-04-05 MED ORDER — SODIUM CHLORIDE (PF) 0.9 % IJ SOLN
INTRAMUSCULAR | Status: AC
  Filled 2024-04-05: qty 50

## 2024-04-05 MED ORDER — FENTANYL CITRATE (PF) 50 MCG/ML IJ SOSY
PREFILLED_SYRINGE | INTRAMUSCULAR | Status: AC
  Filled 2024-04-05: qty 2

## 2024-04-05 MED ORDER — METHOCARBAMOL 1000 MG/10ML IJ SOLN: 500.0000 mg | Freq: Four times a day (QID) | INTRAMUSCULAR | Status: DC | PRN

## 2024-04-05 MED ORDER — TRAMADOL HCL 50 MG PO TABS
50.0000 mg | ORAL_TABLET | Freq: Four times a day (QID) | ORAL | Status: DC | PRN
  Filled 2024-04-05: qty 2

## 2024-04-05 MED ORDER — DAPAGLIFLOZIN PROPANEDIOL 5 MG PO TABS
5.0000 mg | ORAL_TABLET | Freq: Every day | ORAL | Status: DC
  Administered 2024-04-06: 10:00:00 5 mg via ORAL
  Filled 2024-04-05: qty 1

## 2024-04-05 MED ORDER — HYDROCHLOROTHIAZIDE 12.5 MG PO TABS
12.5000 mg | ORAL_TABLET | Freq: Every day | ORAL | Status: DC
  Administered 2024-04-06: 10:00:00 12.5 mg via ORAL
  Filled 2024-04-05: qty 1

## 2024-04-05 MED ORDER — POVIDONE-IODINE 10 % EX SWAB: 2.0000 | Freq: Once | CUTANEOUS | Status: DC

## 2024-04-05 MED ORDER — ONDANSETRON HCL 4 MG/2ML IJ SOLN
INTRAMUSCULAR | Status: DC | PRN
  Administered 2024-04-05: 08:00:00 4 mg via INTRAVENOUS

## 2024-04-05 MED ORDER — PHENYLEPHRINE 80 MCG/ML (10ML) SYRINGE FOR IV PUSH (FOR BLOOD PRESSURE SUPPORT)
PREFILLED_SYRINGE | INTRAVENOUS | Status: AC
  Filled 2024-04-05: qty 10

## 2024-04-05 MED ORDER — FENTANYL CITRATE (PF) 100 MCG/2ML IJ SOLN
INTRAMUSCULAR | Status: DC | PRN
  Administered 2024-04-05 (×2): 25 ug via INTRAVENOUS

## 2024-04-05 MED ORDER — DEXAMETHASONE SOD PHOSPHATE PF 10 MG/ML IJ SOLN
8.0000 mg | Freq: Once | INTRAMUSCULAR | Status: AC
  Administered 2024-04-05: 08:00:00 8 mg via INTRAVENOUS

## 2024-04-05 MED ORDER — SODIUM CHLORIDE 0.9 % IV SOLN
INTRAVENOUS | Status: AC | PRN
  Administered 2024-04-05: 08:00:00 1000 mL via INTRAMUSCULAR

## 2024-04-05 MED ORDER — ASPIRIN 81 MG PO CHEW
81.0000 mg | CHEWABLE_TABLET | Freq: Two times a day (BID) | ORAL | Status: DC
  Administered 2024-04-06: 10:00:00 81 mg via ORAL
  Filled 2024-04-05: qty 1

## 2024-04-05 MED ORDER — EPHEDRINE 5 MG/ML INJ
INTRAVENOUS | Status: AC
  Filled 2024-04-05: qty 5

## 2024-04-05 MED ORDER — HYDROMORPHONE HCL 2 MG PO TABS
1.0000 mg | ORAL_TABLET | ORAL | Status: DC | PRN
  Administered 2024-04-05 – 2024-04-06 (×2): 2 mg via ORAL
  Filled 2024-04-05 (×3): qty 1

## 2024-04-05 MED ORDER — CEFAZOLIN SODIUM-DEXTROSE 2-4 GM/100ML-% IV SOLN
2.0000 g | INTRAVENOUS | Status: AC
  Administered 2024-04-05: 08:00:00 2 g via INTRAVENOUS
  Filled 2024-04-05: qty 100

## 2024-04-05 SURGICAL SUPPLY — 46 items
BAG COUNTER SPONGE SURGICOUNT (BAG) IMPLANT
BAG ZIPLOCK 12X15 (MISCELLANEOUS) IMPLANT
BLADE SAW RECIPROCATING 77.5 (BLADE) ×1 IMPLANT
BLADE SAW SGTL 11.0X1.19X90.0M (BLADE) ×1 IMPLANT
BNDG ELASTIC 6INX 5YD STR LF (GAUZE/BANDAGES/DRESSINGS) ×1 IMPLANT
BOWL SMART MIX CTS (DISPOSABLE) ×1 IMPLANT
CEMENT HV SMART SET (Cement) ×1 IMPLANT
COMPONENT TIB PERSONA SZ C LT (Knees) IMPLANT
COVER SURGICAL LIGHT HANDLE (MISCELLANEOUS) ×1 IMPLANT
CUFF TRNQT CYL 34X4.125X (TOURNIQUET CUFF) ×1 IMPLANT
DERMABOND ADVANCED .7 DNX12 (GAUZE/BANDAGES/DRESSINGS) ×1 IMPLANT
DRAPE U-SHAPE 47X51 STRL (DRAPES) ×1 IMPLANT
DRSG ADAPTIC 3X8 NADH LF (GAUZE/BANDAGES/DRESSINGS) IMPLANT
DRSG AQUACEL AG ADV 3.5X10 (GAUZE/BANDAGES/DRESSINGS) ×1 IMPLANT
DURAPREP 26ML APPLICATOR (WOUND CARE) ×1 IMPLANT
ELECT REM PT RETURN 15FT ADLT (MISCELLANEOUS) ×1 IMPLANT
GAUZE PAD ABD 8X10 STRL (GAUZE/BANDAGES/DRESSINGS) IMPLANT
GAUZE SPONGE 4X4 12PLY STRL (GAUZE/BANDAGES/DRESSINGS) IMPLANT
GLOVE BIO SURGEON STRL SZ 6.5 (GLOVE) ×2 IMPLANT
GLOVE BIO SURGEON STRL SZ7 (GLOVE) IMPLANT
GLOVE BIO SURGEON STRL SZ8 (GLOVE) ×1 IMPLANT
GLOVE BIOGEL PI IND STRL 6.5 (GLOVE) IMPLANT
GLOVE BIOGEL PI IND STRL 7.0 (GLOVE) ×1 IMPLANT
GLOVE BIOGEL PI IND STRL 8 (GLOVE) ×1 IMPLANT
GOWN STRL REUS W/ TWL LRG LVL3 (GOWN DISPOSABLE) ×2 IMPLANT
IMMOBILIZER KNEE 20 THIGH 36 (SOFTGOODS) ×1 IMPLANT
INSERT ARTSRF LT MDL SZ C 9 (Miscellaneous) IMPLANT
INSERTER TIP PARTIAL KNEE (MISCELLANEOUS) IMPLANT
KIT TURNOVER KIT A (KITS) ×1 IMPLANT
MANIFOLD NEPTUNE II (INSTRUMENTS) ×1 IMPLANT
NDL SAFETY ECLIPSE 18X1.5 (NEEDLE) IMPLANT
NS IRRIG 1000ML POUR BTL (IV SOLUTION) ×1 IMPLANT
PACK TOTAL KNEE CUSTOM (KITS) ×1 IMPLANT
PADDING CAST COTTON 6X4 STRL (CAST SUPPLIES) ×2 IMPLANT
PROTECTOR NERVE ULNAR (MISCELLANEOUS) ×1 IMPLANT
SET HNDPC FAN SPRY TIP SCT (DISPOSABLE) ×1 IMPLANT
STRIP CLOSURE SKIN 1/2X4 (GAUZE/BANDAGES/DRESSINGS) IMPLANT
SUT MNCRL AB 4-0 PS2 18 (SUTURE) ×1 IMPLANT
SUT STRATAFIX SPIRL PDS+ 45CM (SUTURE) IMPLANT
SUT VIC AB 2-0 CT1 TAPERPNT 27 (SUTURE) ×2 IMPLANT
SUTURE STRATFX 0 PDS 27 VIOLET (SUTURE) ×1 IMPLANT
SYR 50ML LL SCALE MARK (SYRINGE) IMPLANT
SYSTEM KNEE FEM CMT SZ 2 (Joint) IMPLANT
TRAY FOLEY MTR SLVR 16FR STAT (SET/KITS/TRAYS/PACK) IMPLANT
TUBE SUCTION HIGH CAP CLEAR NV (SUCTIONS) ×1 IMPLANT
WRAP KNEE MAXI GEL POST OP (GAUZE/BANDAGES/DRESSINGS) ×1 IMPLANT

## 2024-04-05 NOTE — Op Note (Signed)
 OPERATIVE REPORT-UNICOMPARTMENTAL ARTHROPLASTY  PREOPERATIVE DIAGNOSIS: Medial compartment osteoarthritis, Left knee  POSTOPERATIVE DIAGNOSIS: Medial compartment osteoarthritis, Left knee  PROCEDURE: Left knee medial unicompartmental arthroplasty. ([facility])  SURGEON: Dempsey Moan, MD   ASSISTANT: Roxie Mess, PA-C  ANESTHESIA:  Adductor canal block and spinal.   ESTIMATED BLOOD LOSS: Minimal.   DRAINS: Hemovac x1.   TOURNIQUET TIME:  Total Tourniquet Time Documented: Thigh (Left) - 27 minutes Total: Thigh (Left) - 27 minutes   COMPLICATIONS: None.   CONDITION: Stable to recovery.   BRIEF CLINICAL NOTE:  [name] is a 83 y.o. female , who has  significant isolated medial compartment arthritis of the Left knee. The patient has had nonoperative management including injections of cortisone and viscous supplements. Unfortunately, the pain persists.  Radiograph showed isolated medial compartment bone-on-bone arthritis  with normal-appearing patellofemoral and lateral compartments. The patient presents now for left knee unicompartmental arthroplasty.   PROCEDURE IN DETAIL: After successful administration of  Adductor canal block and spinal anesthetic, a tourniquet was placed high on theLeft thigh and the Left lower extremity prepped and draped in usual sterile fashion. Extremity was wrapped in an Esmarch, knee flexed, and tourniquet inflated to 300 mmHg.       A midline incision was made with a 10 blade through subcutaneous tissue to the extensor mechanism. A fresh blade was used to make a  medial parapatellar arthrotomy. Soft tissue on the proximal medial  tibia subperiosteally elevated to the joint line with a knife and into  the semimembranosus bursa with a Cobb elevator. The patella was  subluxed laterally, and the knee flexed 90 degrees. The ACL was intact.  The marginal osteophytes on the medial femur and tibia were removed with  a rongeur. The medial  meniscus was also removed. The extramedullary tibial cutting guide was placed referencing Proximally at the medial aspect of the tibial tubercle and distally along the 2nd metatarsal axis and tibial crest. 6 degrees of posterior slope was dialed in and the block was pinned to remove 4 mm from the medial tibial surface.The cut is made with an oscillating saw and the cut bone removed.      The 9 mm spacer was then placed with the knee in extension with a stable fit. The distal femoral cutting guide was attached to the spacer in extension and pinned to make the distal femur cut with an oscillating saw.  The trial size 2 is placed and is most appropriate. The femoral preparation is completed with the posterior and chamfer cuts and drilling of the 2 lug holes. The trial size 2 femur is placed with excellent fit. The 9 mm spacer is placed and there is excellent balance through full range of motion.  The trial and the spacer are removed and tibia addressed.      The tibial sizer is placed and size C is most appropriate. The proximal tibia is prepared with the drill holes and keel for the size C The size C implant is placed with excellent fit. The trials are removed and cut bone surfaces prepared with pulsatile lavage. The cement is mixed and once ready for implantation The size C tibia and size 2 femur are cemented into place and all extruded cement removed. The 9 mm insert is then placed into the tibial tray  and locked into position. The knee is placed through a full range of motion with excellent stability.           I then injected the extensor  mechanism, periosteum of  the femur and subcu tissues, a total of 20 mL of Exparel  mixed with 30  mL of saline. Wound was copiously irrigated with saline solution, and the arthrotomy closed over a Hemovac drain with a running #0 Stratofix  suture. The subcutaneous was closed with  interrupted 2-0 Vicryl and subcuticular running 4-0 Monocryl. The drain  was hooked  to suction. Incision cleaned and dried and Steri-Strips and  a bulky sterile dressing applied. The tourniquet was released after a  total time of 27 minutes. This was done after closing the extensor  mechanism. The wound was closed and a bulky sterile dressing was  applied. The operative limb was placed into a knee immobilizer, and the patient awakened and transported to recovery room in stable condition.       Please note that a surgical assistant was a medical necessity for this  procedure in order to perform it in a safe and expeditious manner.  Assistance was necessary for retracting vital ligaments, neurovascular  structures, as well as for proper positioning of the limb to allow for  appropriate bone cuts and appropriate placement of the prosthesis.    Dempsey ROCKFORD Shanyiah Conde, MD

## 2024-04-05 NOTE — Discharge Instructions (Addendum)
 Julia Moan, MD [facility] Specialist [facility] [address], [address] [place], [place] [address] [phone]  ORTHOPEDIC POSTOPERATIVE DIRECTIONS    Knee Rehabilitation, Guidelines Following Surgery  Results after knee surgery are often greatly improved when you follow the exercise, range of motion and muscle strengthening exercises prescribed by your doctor. Safety measures are also important to protect the knee from further injury. If any of these exercises cause you to have increased pain or swelling in your knee joint, decrease the amount until you are comfortable again and slowly increase them. If you have problems or questions, call your caregiver or physical therapist for advice.   BLOOD CLOT PREVENTION Take an 81 mg Aspirin  two times a day for three weeks following surgery. Then take an 81 mg Aspirin  once a day for three weeks. Then discontinue Aspirin . You may resume your vitamins/supplements upon discharge from the hospital. Do not take any NSAIDs (Advil , Aleve, Ibuprofen , Meloxicam, etc.) until you are 3 weeks out from surgery.   HOME CARE INSTRUCTIONS  Remove items at home which could result in a fall. This includes throw rugs or furniture in walking pathways.  ICE to the affected knee as much as tolerated. Icing helps control swelling. If the swelling is well controlled you will be more comfortable and rehab easier. Continue to use ice on the knee for pain and swelling from surgery. You may notice swelling that will progress down to the foot and ankle. This is normal after surgery. Elevate the leg when you are not up walking on it.    Continue to use the breathing machine which will help keep your temperature down. It is common for your temperature to cycle up and down following surgery, especially at night when you are not up moving around and exerting yourself. The breathing machine keeps your lungs expanded and your temperature down. Do not place  pillow under the operative knee, focus on keeping the knee straight while resting  DIET You may resume your previous home diet once you are discharged from the hospital.  DRESSING / WOUND CARE / SHOWERING Keep your bulky bandage on for 2 days. On the third post-operative day you may remove the Ace bandage and gauze. You may begin showering 3 days following surgery, but do not submerge the incision under water .  ACTIVITY For the first 5 days, the key is rest and control of pain and swelling Do your home exercises twice a day starting on post-operative day 3. On the days you go to physical therapy, just do the home exercises once that day. You should rest, ice and elevate the leg for 50 minutes out of every hour. Get up and walk/stretch for 10 minutes per hour. After 5 days you can increase your activity slowly as tolerated. Walk with your walker as instructed. Use the walker until you are comfortable transitioning to a cane. Walk with the cane in the opposite hand of the operative leg. You may discontinue the cane once you are comfortable and walking steadily. Avoid periods of inactivity such as sitting longer than an hour when not asleep. This helps prevent blood clots.  You may discontinue the knee immobilizer once you are able to perform a straight leg raise while lying down. You may resume a sexual relationship in one month or when given the OK by your doctor.  You may return to work once you are cleared by your doctor.  Do not drive a car for 6 weeks or until  released by your surgeon.  Do not drive while taking narcotics.  TED HOSE STOCKINGS Wear the elastic stockings on both legs for three weeks following surgery during the day. You may remove them at night for sleeping.  WEIGHT BEARING Weight bearing as tolerated with assist device (walker, cane, etc) as directed, use it as long as suggested by your surgeon or therapist, typically at least 4-6 weeks.  POSTOPERATIVE CONSTIPATION  PROTOCOL Constipation - defined medically as fewer than three stools per week and severe constipation as less than one stool per week.  One of the most common issues patients have following surgery is constipation.  Even if you have a regular bowel pattern at home, your normal regimen is likely to be disrupted due to multiple reasons following surgery.  Combination of anesthesia, postoperative narcotics, change in appetite and fluid intake all can affect your bowels.  In order to avoid complications following surgery, here are some recommendations in order to help you during your recovery period.  Colace (docusate) - Pick up an over-the-counter form of Colace or another stool softener and take twice a day as long as you are requiring postoperative pain medications.  Take with a full glass of water  daily.  If you experience loose stools or diarrhea, hold the colace until you stool forms back up. If your symptoms do not get better within 1 week or if they get worse, check with your doctor. Dulcolax (bisacodyl ) - Pick up over-the-counter and take as directed by the product packaging as needed to assist with the movement of your bowels.  Take with a full glass of water .  Use this product as needed if not relieved by Colace only.  MiraLax  (polyethylene glycol) - Pick up over-the-counter to have on hand. MiraLax  is a solution that will increase the amount of water  in your bowels to assist with bowel movements.  Take as directed and can mix with a glass of water , juice, soda, coffee, or tea. Take if you go more than two days without a movement. Do not use MiraLax  more than once per day. Call your doctor if you are still constipated or irregular after using this medication for 7 days in a row.  If you continue to have problems with postoperative constipation, please contact the office for further assistance and recommendations.  If you experience the worst abdominal pain ever or develop nausea or vomiting, please  contact the office immediatly for further recommendations for treatment.  ITCHING If you experience itching with your medications, try taking only a single pain pill, or even half a pain pill at a time.  You can also use Benadryl  over the counter for itching or also to help with sleep.   MEDICATIONS See your medication summary on the After Visit Summary that the nursing staff will review with you prior to discharge.  You may have some home medications which will be placed on hold until you complete the course of blood thinner medication.  It is important for you to complete the blood thinner medication as prescribed by your surgeon.  Continue your approved medications as instructed at time of discharge.  PRECAUTIONS If you experience chest pain or shortness of breath - call 911 immediately for transfer to the hospital emergency department.  If you develop a fever greater that 101 F, purulent drainage from wound, increased redness or drainage from wound, foul odor from the wound/dressing, or calf pain - CONTACT YOUR SURGEON.  FOLLOW-UP APPOINTMENTS Make sure you keep all of your appointments after your operation with your surgeon and caregivers. You should call the office at the above phone number and make an appointment for approximately two weeks after the date of your surgery or on the date instructed by your surgeon outlined in the After Visit Summary.  RANGE OF MOTION AND STRENGTHENING EXERCISES  Rehabilitation of the knee is important following a knee injury or an operation. After just a few days of immobilization, the muscles of the thigh which control the knee become weakened and shrink (atrophy). Knee exercises are designed to build up the tone and strength of the thigh muscles and to improve knee motion. Often times heat used for twenty to thirty minutes before working out will loosen up your tissues and help with improving the range of  motion but do not use heat for the first two weeks following surgery. These exercises can be done on a training (exercise) mat, on the floor, on a table or on a bed. Use what ever works the best and is most comfortable for you Knee exercises include:  Leg Lifts - While your knee is still immobilized in a splint or cast, you can do straight leg raises. Lift the leg to 60 degrees, hold for 3 sec, and slowly lower the leg. Repeat 10-20 times 2-3 times daily. Perform this exercise against resistance later as your knee gets better.  Quad and Hamstring Sets - Tighten up the muscle on the front of the thigh (Quad) and hold for 5-10 sec. Repeat this 10-20 times hourly. Hamstring sets are done by pushing the foot backward against an object and holding for 5-10 sec. Repeat as with quad sets.  Leg Slides: Lying on your back, slowly slide your foot toward your buttocks, bending your knee up off the floor (only go as far as is comfortable). Then slowly slide your foot back down until your leg is flat on the floor again. Angel Wings: Lying on your back spread your legs to the side as far apart as you can without causing discomfort.  A rehabilitation program following serious knee injuries can speed recovery and prevent re-injury in the future due to weakened muscles. Contact your doctor or a physical therapist for more information on knee rehabilitation.   POST-OPERATIVE OPIOID TAPER INSTRUCTIONS: It is important to wean off of your opioid medication as soon as possible. If you do not need pain medication after your surgery it is ok to stop day one. Opioids include: Codeine, Hydrocodone (Norco, Vicodin), Oxycodone (Percocet, oxycontin ) and hydromorphone  amongst others.  Long term and even short term use of opiods can cause: Increased pain response Dependence Constipation Depression Respiratory depression And more.  Withdrawal symptoms can include Flu like symptoms Nausea, vomiting And more Techniques to manage  these symptoms Hydrate well Eat regular healthy meals Stay active Use relaxation techniques(deep breathing, meditating, yoga) Do Not substitute Alcohol to help with tapering If you have been on opioids for less than two weeks and do not have pain than it is ok to stop all together.  Plan to wean off of opioids This plan should start within one week post op of your joint replacement. Maintain the same interval or time between taking each dose and first decrease the dose.  Cut the total daily intake of opioids by one tablet each day Next start to increase the time between doses. The last dose that should be eliminated is the evening dose.   IF YOU ARE TRANSFERRED TO  A SKILLED REHAB FACILITY If the patient is transferred to a skilled rehab facility following release from the hospital, a list of the current medications will be sent to the facility for the patient to continue.  When discharged from the skilled rehab facility, please have the facility set up the patient's Home Health Physical Therapy prior to being released. Also, the skilled facility will be responsible for providing the patient with their medications at time of release from the facility to include their pain medication, the muscle relaxants, and their blood thinner medication. If the patient is still at the rehab facility at time of the two week follow up appointment, the skilled rehab facility will also need to assist the patient in arranging follow up appointment in our office and any transportation needs.  MAKE SURE YOU:  Understand these instructions.  Get help right away if you are not doing well or get worse.   DENTAL ANTIBIOTICS:  In most cases prophylactic antibiotics for Dental procdeures after total joint surgery are not necessary.  Exceptions are as follows:  1. History of prior total joint infection  2. Severely immunocompromised (Organ Transplant, cancer chemotherapy, Rheumatoid biologic meds such as  Humera)  3. Poorly controlled diabetes (A1C &gt; 8.0, blood glucose over 200)  If you have one of these conditions, contact your surgeon for an antibiotic prescription, prior to your dental procedure.    Pick up stool softner and laxative for home use following surgery while on pain medications. Do not submerge incision under water . Please use good hand washing techniques while changing dressing each day. May shower starting three days after surgery. Please use a clean towel to pat the incision dry following showers. Continue to use ice for pain and swelling after surgery. Do not use any lotions or creams on the incision until instructed by your surgeon.

## 2024-04-05 NOTE — Progress Notes (Signed)
 Orthopedic Tech Progress Note Patient Details:  [name] [birth year] [id number]  CPM Left Knee CPM Left Knee: Off Left Knee Flexion (Degrees): 40 Left Knee Extension (Degrees): 10  Post Interventions Patient Tolerated: Poor  [name] 04/05/2024, 1:26 PM

## 2024-04-05 NOTE — Interval H&P Note (Signed)
 History and Physical Interval Note:  04/05/2024 6:37 AM  [name]  has presented today for surgery, with the diagnosis of Left medial compartment osteoarthritis.  The various methods of treatment have been discussed with the patient and family. After consideration of risks, benefits and other options for treatment, the patient has consented to  Procedures with comments: ARTHROPLASTY, KNEE, UNICOMPARTMENTAL (Left) - Left medial unicompartmental arthroplasty as a surgical intervention.  The patient's history has been reviewed, patient examined, no change in status, stable for surgery.  I have reviewed the patient's chart and labs.  Questions were answered to the patient's satisfaction.     Dempsey Tamel Abel

## 2024-04-05 NOTE — Transfer of Care (Signed)
 Immediate Anesthesia Transfer of Care Note  Patient: [name]  Procedure(s) Performed: ARTHROPLASTY, KNEE, UNICOMPARTMENTAL (Left: Knee)  Patient Location: PACU  Anesthesia Type:General and Regional  Level of Consciousness: awake, alert , oriented, and patient cooperative  Airway & Oxygen  Therapy: Patient Spontanous Breathing and Patient connected to face mask oxygen   Post-op Assessment: Report given to RN and Post -op Vital signs reviewed and stable  Post vital signs: Reviewed and stable  Last Vitals:  Vitals Value Taken Time  BP 164/87 04/05/24 09:01  Temp    Pulse 73 04/05/24 09:05  Resp 14 04/05/24 09:05  SpO2 100 % 04/05/24 09:05  Vitals shown include unfiled device data.  Last Pain:  Vitals:   04/05/24 0900  PainSc: 9          Complications: No notable events documented.

## 2024-04-05 NOTE — Evaluation (Signed)
 Physical Therapy Evaluation Patient Details Name: [name] MRN: [id number] DOB: August 02, 1940 Today's Date: 04/05/2024  History of Present Illness  83 yo female s/p L unicompartmental knee arthroplasty 04/05/24. Hx of DM, fibromyalgia, neuropathy, R TKA 2021, R TK revision 2022, anemia, L femur IM nail 2020, lumbar fusion  Clinical Impression  On eval POD 0, pt required Min A for mobility. She ambulated ~7 feet with a RW. Pain rated 8/10 during session. Assisted pt into recliner and encouraged her to sit up as tolerated. Will plan to follow and progress activity as tolerated. Plan is for f/u PT at her independent living facility.         If plan is discharge home, recommend the following: A little help with walking and/or transfers;A little help with bathing/dressing/bathroom;Assistance with cooking/housework;Assist for transportation;Help with stairs or ramp for entrance   Can travel by private vehicle        Equipment Recommendations None recommended by PT  Recommendations for Other Services       Functional Status Assessment Patient has had a recent decline in their functional status and demonstrates the ability to make significant improvements in function in a reasonable and predictable amount of time.     Precautions / Restrictions Precautions Precautions: Fall;Knee Restrictions Weight Bearing Restrictions Per Provider Order: Yes LLE Weight Bearing Per Provider Order: Weight bearing as tolerated      Mobility  Bed Mobility Overal bed mobility: Needs Assistance Bed Mobility: Supine to Sit     Supine to sit: Contact guard, HOB elevated, Used rails     General bed mobility comments: Pt used gait belt as leg lifter to assist L LE off bed. Increased time. Cues provided as needed.    Transfers Overall transfer level: Needs assistance Equipment used: Rolling walker (2 wheels) Transfers: Sit to/from Stand Sit to Stand: Min assist           General transfer  comment: Cues for safety, technqiue, hand/LE placement. Assist to rise, steady, control descent.    Ambulation/Gait Ambulation/Gait assistance: Min assist Gait Distance (Feet): 7 Feet Assistive device: Rolling walker (2 wheels) Gait Pattern/deviations: Step-to pattern       General Gait Details: Cues for safety, sequencing, technique. Slow gait speed. Pt denied dizziness. Followed closely with recliner and transported pt back to bedside.  Stairs            Wheelchair Mobility     Tilt Bed    Modified Rankin (Stroke Patients Only)       Balance Overall balance assessment: Needs assistance, History of Falls         Standing balance support: Bilateral upper extremity supported, During functional activity, Reliant on assistive device for balance Standing balance-Leahy Scale: Poor                               Pertinent Vitals/Pain Pain Assessment Pain Assessment: 0-10 Pain Score: 8  Pain Location: L knee with activity Pain Descriptors / Indicators: Aching, Sharp, Operative site guarding Pain Intervention(s): Limited activity within patient's tolerance, Monitored during session, Ice applied, Repositioned    Home Living       Type of Home: Independent living facility [facility]) Home Access: Level entry;Elevator       Home Layout: One level Home Equipment: Rollator (4 wheels);Cane - single point      Prior Function Prior Level of Function : Independent/Modified Independent  Mobility Comments: uses rollator       Extremity/Trunk Assessment   Upper Extremity Assessment Upper Extremity Assessment: Overall WFL for tasks assessed    Lower Extremity Assessment Lower Extremity Assessment: Generalized weakness    Cervical / Trunk Assessment Cervical / Trunk Assessment: Normal  Communication   Communication Communication: No apparent difficulties    Cognition Arousal: Alert Behavior During Therapy: WFL for tasks  assessed/performed   PT - Cognitive impairments: No apparent impairments                         Following commands: Intact       Cueing Cueing Techniques: Verbal cues     General Comments      Exercises     Assessment/Plan    PT Assessment Patient needs continued PT services  PT Problem List Decreased strength;Decreased range of motion;Decreased activity tolerance;Decreased balance;Decreased mobility;Pain;Decreased knowledge of use of DME       PT Treatment Interventions DME instruction;Gait training;Stair training;Functional mobility training;Therapeutic activities;Therapeutic exercise;Patient/family education;Balance training    PT Goals (Current goals can be found in the Care Plan section)  Acute Rehab PT Goals Patient Stated Goal: regain plof/independence PT Goal Formulation: With patient Time For Goal Achievement: 04/19/24 Potential to Achieve Goals: Good    Frequency 7X/week     Co-evaluation               AM-PAC PT 6 Clicks Mobility  Outcome Measure Help needed turning from your back to your side while in a flat bed without using bedrails?: A Little Help needed moving from lying on your back to sitting on the side of a flat bed without using bedrails?: A Little Help needed moving to and from a bed to a chair (including a wheelchair)?: A Little Help needed standing up from a chair using your arms (e.g., wheelchair or bedside chair)?: A Little Help needed to walk in hospital room?: A Little Help needed climbing 3-5 steps with a railing? : A Little 6 Click Score: 18    End of Session Equipment Utilized During Treatment: Gait belt Activity Tolerance: Patient tolerated treatment well Patient left: in chair;with call bell/phone within reach   PT Visit Diagnosis: Pain;Other abnormalities of gait and mobility (R26.89) Pain - Right/Left: Left Pain - part of body: Knee    Time: 1541-1611 PT Time Calculation (min) (ACUTE ONLY): 30  min   Charges:   PT Evaluation $PT Eval Low Complexity: 1 Low PT Treatments $Gait Training: 8-22 mins PT General Charges $$ ACUTE PT VISIT: 1 Visit           Dannial SQUIBB, PT Acute Rehabilitation  Office: [phone]

## 2024-04-05 NOTE — Progress Notes (Signed)
 Orthopedic Tech Progress Note Patient Details:  [name] 09/08/1940 [id number]  CPM Left Knee CPM Left Knee: On Left Knee Flexion (Degrees): 40 Left Knee Extension (Degrees): 10  Post Interventions Patient Tolerated: Poor  Laymon DELENA Munroe 04/05/2024, 9:19 AM

## 2024-04-05 NOTE — Anesthesia Procedure Notes (Signed)
 Procedure Name: LMA Insertion Date/Time: 04/05/2024 7:37 AM  Performed by: Nada Corean CROME, CRNAPre-anesthesia Checklist: Emergency Drugs available, Patient identified, Suction available, Patient being monitored and Timeout performed Patient Re-evaluated:Patient Re-evaluated prior to induction Oxygen  Delivery Method: Circle system utilized Preoxygenation: Pre-oxygenation with 100% oxygen  Induction Type: IV induction Ventilation: Mask ventilation without difficulty LMA: LMA inserted LMA Size: 4.0 Tube type: Oral Number of attempts: 1 Placement Confirmation: positive ETCO2 and breath sounds checked- equal and bilateral Tube secured with: Tape Dental Injury: Teeth and Oropharynx as per pre-operative assessment

## 2024-04-05 NOTE — Telephone Encounter (Signed)
 Pharmacy Patient Advocate Encounter  Insurance verification completed.    The patient is insured through [employer]. Patient has Medicare and is not eligible for a copay card, but may be able to apply for patient assistance or Medicare RX Payment Plan (Patient Must reach out to their plan, if eligible for payment plan), if available.    Ran test claim for Farxiga  10mg  tablet and the current 30 day co-pay is $0.   This test claim was processed through [employer]- copay amounts may vary at other pharmacies due to [employer], or as the patient moves through the different stages of their insurance plan.

## 2024-04-05 NOTE — Anesthesia Postprocedure Evaluation (Signed)
 Anesthesia Post Note  Patient: [name]  Procedure(s) Performed: ARTHROPLASTY, KNEE, UNICOMPARTMENTAL (Left: Knee)     Patient location during evaluation: PACU Anesthesia Type: Regional and General Level of consciousness: awake Pain management: pain level controlled Vital Signs Assessment: post-procedure vital signs reviewed and stable Respiratory status: spontaneous breathing, nonlabored ventilation and respiratory function stable Cardiovascular status: blood pressure returned to baseline and stable Postop Assessment: no apparent nausea or vomiting Anesthetic complications: no   No notable events documented.  Last Vitals:  Vitals:   04/05/24 1246 04/05/24 1252  BP:  129/69  Pulse:  76  Resp:  18  Temp:  36.9 C  SpO2: 93% 99%    Last Pain:  Vitals:   04/05/24 1719  TempSrc:   PainSc: 4                  Gunner Iodice P Aitan Rossbach

## 2024-04-05 NOTE — Anesthesia Procedure Notes (Signed)
 Anesthesia Regional Block: Adductor canal block   Pre-Anesthetic Checklist: , timeout performed,  Correct Patient, Correct Site, Correct Laterality,  Correct Procedure,, site marked,  Risks and benefits discussed,  Surgical consent,  Pre-op evaluation,  At surgeon's request and post-op pain management  Laterality: Left  Prep: chloraprep       Needles:  Injection technique: Single-shot  Needle Type: Echogenic Stimulator Needle     Needle Length: 10cm  Needle Gauge: 20     Additional Needles:   Procedures:,,,, ultrasound used (permanent image in chart),,    Narrative:  Start time: 04/05/2024 7:05 AM End time: 04/05/2024 7:15 AM Injection made incrementally with aspirations every 5 mL.  Performed by: Personally  Anesthesiologist: Patrisha Bernardino SQUIBB, MD  Additional Notes: Functioning IV was confirmed and monitors were applied. A time-out was performed. Hand hygiene and sterile gloves were used. The thigh was placed in a frog-leg position and prepped in a sterile fashion. A 20ga Bbraun echogenic stimulator needle was placed using ultrasound guidance.  Negative aspiration and negative test dose prior to incremental administration of local anesthetic. The patient tolerated the procedure well.

## 2024-04-06 ENCOUNTER — Other Ambulatory Visit (HOSPITAL_COMMUNITY): Payer: Self-pay

## 2024-04-06 ENCOUNTER — Encounter (HOSPITAL_COMMUNITY): Payer: Self-pay | Admitting: Orthopedic Surgery

## 2024-04-06 DIAGNOSIS — M1712 Unilateral primary osteoarthritis, left knee: Secondary | ICD-10-CM | POA: Diagnosis not present

## 2024-04-06 LAB — CBC
HCT: 36.4 % (ref 36.0–46.0)
Hemoglobin: 12.1 g/dL (ref 12.0–15.0)
MCH: 28.2 pg (ref 26.0–34.0)
MCHC: 33.2 g/dL (ref 30.0–36.0)
MCV: 84.8 fL (ref 80.0–100.0)
Platelets: 194 K/uL (ref 150–400)
RBC: 4.29 MIL/uL (ref 3.87–5.11)
RDW: 14.1 % (ref 11.5–15.5)
WBC: 9.3 K/uL (ref 4.0–10.5)
nRBC: 0 % (ref 0.0–0.2)

## 2024-04-06 LAB — BASIC METABOLIC PANEL WITH GFR
Anion gap: 11 (ref 5–15)
BUN: 18 mg/dL (ref 8–23)
CO2: 26 mmol/L (ref 22–32)
Calcium: 9 mg/dL (ref 8.9–10.3)
Chloride: 105 mmol/L (ref 98–111)
Creatinine, Ser: 0.9 mg/dL (ref 0.44–1.00)
GFR, Estimated: >60 mL/min (ref >60)
Glucose, Bld: 205 mg/dL — ABNORMAL HIGH (ref 70–99)
Potassium: 3.9 mmol/L (ref 3.5–5.1)
Sodium: 142 mmol/L (ref 135–145)

## 2024-04-06 LAB — GLUCOSE, CAPILLARY
Glucose-Capillary: 183 mg/dL — ABNORMAL HIGH (ref 70–99)
Glucose-Capillary: 153 mg/dL — ABNORMAL HIGH (ref 70–99)

## 2024-04-06 MED ORDER — HYDROCODONE-ACETAMINOPHEN 5-325 MG PO TABS
1.0000 | ORAL_TABLET | ORAL | 0 refills | Status: AC | PRN
  Filled 2024-04-06: qty 42, 7d supply, fill #0

## 2024-04-06 MED ORDER — TRAMADOL HCL 50 MG PO TABS
50.0000 mg | ORAL_TABLET | Freq: Four times a day (QID) | ORAL | 0 refills | Status: AC | PRN
  Filled 2024-04-06: qty 40, 5d supply, fill #0

## 2024-04-06 MED ORDER — ONDANSETRON HCL 4 MG PO TABS
4.0000 mg | ORAL_TABLET | Freq: Four times a day (QID) | ORAL | 0 refills | Status: AC | PRN
  Filled 2024-04-06: qty 20, 5d supply, fill #0

## 2024-04-06 MED ORDER — METHOCARBAMOL 500 MG PO TABS
500.0000 mg | ORAL_TABLET | Freq: Four times a day (QID) | ORAL | 0 refills | Status: AC | PRN
  Filled 2024-04-06: qty 40, 10d supply, fill #0

## 2024-04-06 MED ORDER — ASPIRIN 81 MG PO CHEW
81.0000 mg | CHEWABLE_TABLET | Freq: Two times a day (BID) | ORAL | 0 refills | Status: AC
  Filled 2024-04-06: qty 63, 32d supply, fill #0

## 2024-04-06 NOTE — Progress Notes (Signed)
 Subjective: 1 Day Post-Op Procedures (LRB): ARTHROPLASTY, KNEE, UNICOMPARTMENTAL (Left) Patient reports pain as mild.   Patient seen in rounds by Dr. Melodi. Patient is well, and has had no acute complaints or problems No issues overnight. Denies chest pain, SOB, or calf pain  We will continue therapy today, ambulated 7' yesterday.   Objective: Vital signs in last 24 hours: Temp:  [97.6 F (36.4 C)-98.6 F (37 C)] 97.9 F (36.6 C) (12/16 0617) Pulse Rate:  [64-76] 75 (12/16 0617) Resp:  [11-18] 15 (12/16 0617) BP: (98-164)/(61-95) 133/77 (12/16 0617) SpO2:  [93 %-100 %] 97 % (12/16 0802) Weight:  [74 kg] 74 kg (12/15 1041)  Intake/Output from previous day:  Intake/Output Summary (Last 24 hours) at 04/06/2024 0837 Last data filed at 04/06/2024 9375 Gross per 24 hour  Intake 2341.59 ml  Output --  Net 2341.59 ml     Intake/Output this shift: No intake/output data recorded.  Labs: Recent Labs    04/06/24 0335  HGB 12.1   Recent Labs    04/06/24 0335  WBC 9.3  RBC 4.29  HCT 36.4  PLT 194   Recent Labs    04/06/24 0335  NA 142  K 3.9  CL 105  CO2 26  BUN 18  CREATININE 0.90  GLUCOSE 205*  CALCIUM  9.0   No results for input(s): LABPT, INR in the last 72 hours.  Exam: General - Patient is Alert and Oriented Extremity - Neurologically intact Neurovascular intact Sensation intact distally Dorsiflexion/Plantar flexion intact Dressing - dressing C/D/I Motor Function - intact, moving foot and toes well on exam.   Past Medical History:  Diagnosis Date   Acute blood loss anemia    Acute encephalopathy 07/15/2016   AKI (acute kidney injury)    Anemia    has sickle cell trait   Anxiety    Arthritis    Asthma    has used inhaler in past for asthmatic bronchitis, last time- early 2012   Bilateral primary osteoarthritis of knee 03/26/2016   Complication of anesthesia    wakes up shaking   Diabetes mellitus    Diabetes mellitus with neuropathy  (HCC) 10/29/2012   Diverticulitis    Dyslipidemia    Dyspnea    with exertion   Encephalopathy 06/2016   due to medications after surgery   Fibromyalgia    GERD (gastroesophageal reflux disease)    occas. use of  Prilosec   Heart murmur    sees Dr. Andria, last seen- early 2012   Herniated nucleus pulposus, L2-3 06/25/2016   History of back surgery    Hypertension    02/2010- stress test /w PCP   Hypothyroidism    Loose bowel movements 12/2016   Lumbar radiculopathy 06/27/2016   Lumbar stenosis with neurogenic claudication 03/17/2017   Memory disorder 02/16/2016   Myoclonic jerking    Neuromuscular disorder (HCC)    lumbar radiculopathy, lumbago   Nocturnal leg cramps 09/27/2014   Osteoporosis 03/10/2014   Pneumonia    Sickle cell trait    Sleep apnea    borderline sleep apnea, states she no longer uses, early 2012- stopped using    Spinal stenosis of lumbar region with radiculopathy 02/28/2014   Spondylolisthesis of lumbar region 03/19/2011   Type II or unspecified type diabetes mellitus without mention of complication, uncontrolled 07/08/2013    Assessment/Plan: 1 Day Post-Op Procedures (LRB): ARTHROPLASTY, KNEE, UNICOMPARTMENTAL (Left) Principal Problem:   Osteoarthritis of left knee Active Problems:   Primary osteoarthritis of left  knee  Estimated body mass index is 28.9 kg/m as calculated from the following:   Height as of this encounter: 5' 3 (1.6 m).   Weight as of this encounter: 74 kg. Advance diet Up with therapy D/C IV fluids  DVT Prophylaxis - Aspirin  Weight bearing as tolerated. Continue therapy.  Plan is to go Home after hospital stay. Plan for discharge later today if progresses with therapy and meeting goals. Scheduled for OPPT at [facility]. Follow-up in the office in 2 weeks.  The PDMP database was reviewed today prior to any opioid medications being prescribed to this patient.  Julia Mess, PA-C Orthopedic Surgery [phone] 04/06/2024, 8:37 AM

## 2024-04-06 NOTE — Progress Notes (Addendum)
 Physical Therapy Treatment Patient Details Name: [name] MRN: [id number] DOB: 1940/12/07 Today's Date: 04/06/2024   History of Present Illness 83 yo female s/p L unicompartmental knee arthroplasty 04/05/24. Hx of DM, fibromyalgia, neuropathy, R TKA 2021, R TK revision 2022, anemia, L femur IM nail 2020, lumbar fusion    PT Comments  Pt agreeable to session. Reports moderate pain with activity. Reviewed/practiced exercises and gait training. Issued HEP for pt to follow until she begins OPPT--pt also states she has HEP she has been following prior to surgery. Encouraged her to ambulate often at home, as tolerated. Pt reports she has arranged for someone to be with her for a day or so. PT education completed.     If plan is discharge home, recommend the following: A little help with walking and/or transfers;A little help with bathing/dressing/bathroom;Assistance with cooking/housework;Assist for transportation;Help with stairs or ramp for entrance   Can travel by private vehicle        Equipment Recommendations  None recommended by PT    Recommendations for Other Services       Precautions / Restrictions Precautions Precautions: Fall;Knee Restrictions Weight Bearing Restrictions Per Provider Order: No LLE Weight Bearing Per Provider Order: Weight bearing as tolerated     Mobility  Bed Mobility               General bed mobility comments: oob in recliner    Transfers Overall transfer level: Needs assistance Equipment used: Rolling walker (2 wheels) Transfers: Sit to/from Stand Sit to Stand: Supervision           General transfer comment: Cues for safety, technqiue, hand/LE placement.    Ambulation/Gait Ambulation/Gait assistance: Contact guard assist Gait Distance (Feet): 75 Feet Assistive device: Rolling walker (2 wheels) Gait Pattern/deviations: Step-through pattern, Decreased stride length       General Gait Details: Cues for safety, sequencing,  technique. Slow gait speed. Pt denied dizziness. Tolerated distance well. No LOB with RW use   Stairs             Wheelchair Mobility     Tilt Bed    Modified Rankin (Stroke Patients Only)       Balance Overall balance assessment: Needs assistance, History of Falls         Standing balance support: Bilateral upper extremity supported, During functional activity, Reliant on assistive device for balance Standing balance-Leahy Scale: Fair                              Hotel Manager: No apparent difficulties  Cognition Arousal: Alert Behavior During Therapy: WFL for tasks assessed/performed   PT - Cognitive impairments: No apparent impairments                         Following commands: Intact      Cueing Cueing Techniques: Verbal cues  Exercises Total Joint Exercises Ankle Circles/Pumps: AROM, Both, 10 reps Quad Sets: AROM, Left, 10 reps Heel Slides: AAROM, Left, 10 reps Straight Leg Raises: AAROM, Left, 10 reps Long Arc Quad: AROM, Left, 10 reps Knee Flexion: AROM, Left, 10 reps, Seated Goniometric ROM: ~10-75 degrees    General Comments        Pertinent Vitals/Pain Pain Assessment Pain Assessment: Faces Faces Pain Scale: Hurts even more Pain Location: L knee with activity Pain Descriptors / Indicators: Aching, Sharp, Operative site guarding Pain Intervention(s): Limited activity within patient's tolerance,  Monitored during session, Ice applied, Repositioned    Home Living                          Prior Function            PT Goals (current goals can now be found in the care plan section) Progress towards PT goals: Progressing toward goals    Frequency    7X/week      PT Plan      Co-evaluation              AM-PAC PT 6 Clicks Mobility   Outcome Measure  Help needed turning from your back to your side while in a flat bed without using bedrails?: A Little Help  needed moving from lying on your back to sitting on the side of a flat bed without using bedrails?: A Little Help needed moving to and from a bed to a chair (including a wheelchair)?: A Little Help needed standing up from a chair using your arms (e.g., wheelchair or bedside chair)?: A Little Help needed to walk in hospital room?: A Little Help needed climbing 3-5 steps with a railing? : A Little 6 Click Score: 18    End of Session Equipment Utilized During Treatment: Gait belt Activity Tolerance: Patient tolerated treatment well Patient left: in chair;with call bell/phone within reach Nurse Communication:  (called to front desk to let RN know pt would need a new dressing on her knee--ace wrap and gauze too loose and fell to her ankle) PT Visit Diagnosis: Pain;Other abnormalities of gait and mobility (R26.89) Pain - Right/Left: Left Pain - part of body: Knee     Time: 1010-1042 PT Time Calculation (min) (ACUTE ONLY): 32 min  Charges:    $Gait Training: 8-22 mins $Therapeutic Exercise: 8-22 mins PT General Charges $$ ACUTE PT VISIT: 1 Visit                       Dannial SQUIBB, PT Acute Rehabilitation  Office: [phone]

## 2024-04-06 NOTE — Care Management Obs Status (Signed)
 MEDICARE OBSERVATION STATUS NOTIFICATION   Patient Details  Name: [name] MRN: [id number] Date of Birth: 1940/06/28   Medicare Observation Status Notification Given:  Yes Patient declined to sign.   Walsie Smeltz, LCSW 04/06/2024, 11:38 AM

## 2024-04-06 NOTE — TOC Transition Note (Signed)
 Transition of Care [facility]) - Discharge Note   Patient Details  Name: [name] MRN: [id number] Date of Birth: Oct 30, 1940  [facility] CM/SW Contact:  NORMAN ASPEN, LCSW Phone Number: 04/06/2024, 11:40 AM   Clinical Narrative:     Met with pt who confirms she has needed [place] [facility].  [facility] is already set up to provide PT services at [facility].  No further IP CM needs.  Final next level of care: OP Rehab Barriers to Discharge: No Barriers Identified   Patient Goals and CMS Choice Patient states their goals for this hospitalization and ongoing recovery are:: return home          Discharge Placement                       Discharge Plan and Services Additional resources added to the After Visit Summary for                  DME Arranged: N/A DME Agency: NA                  Social Drivers of Health (SDOH) Interventions SDOH Screenings   Food Insecurity: No Food Insecurity (04/05/2024)  Housing: Low Risk (04/05/2024)  Transportation Needs: No Transportation Needs (04/05/2024)  Utilities: Not At Risk (04/05/2024)  Social Connections: Moderately Integrated (04/05/2024)  Tobacco Use: Medium Risk (04/05/2024)     Readmission Risk Interventions     No data to display

## 2024-04-06 NOTE — Plan of Care (Signed)
°  Problem: Pain Management: Goal: Pain level will decrease with appropriate interventions Outcome: Progressing   Problem: Activity: Goal: Range of joint motion will improve Outcome: Progressing   Problem: Education: Goal: Knowledge of the prescribed therapeutic regimen will improve Outcome: Progressing   Problem: Metabolic: Goal: Ability to maintain appropriate glucose levels will improve Outcome: Progressing   Problem: Safety: Goal: Ability to remain free from injury will improve Outcome: Progressing   Problem: Elimination: Goal: Will not experience complications related to urinary retention Outcome: Progressing

## 2024-04-06 NOTE — Progress Notes (Signed)
 Discharge medications delivered to patient at the bedside.

## 2024-04-07 ENCOUNTER — Other Ambulatory Visit (HOSPITAL_COMMUNITY): Payer: Self-pay

## 2024-04-07 NOTE — Discharge Summary (Signed)
 Patient ID: [name] MRN: [id number] DOB/AGE: [birth year] 83 y.o.  Admit date: 04/05/2024 Discharge date: 04/06/2024  Admission Diagnoses:  Principal Problem:   Osteoarthritis of left knee Active Problems:   Primary osteoarthritis of left knee   Discharge Diagnoses:  Same  Past Medical History:  Diagnosis Date   Acute blood loss anemia    Acute encephalopathy 07/15/2016   AKI (acute kidney injury)    Anemia    has sickle cell trait   Anxiety    Arthritis    Asthma    has used inhaler in past for asthmatic bronchitis, last time- early 2012   Bilateral primary osteoarthritis of knee 03/26/2016   Complication of anesthesia    wakes up shaking   Diabetes mellitus    Diabetes mellitus with neuropathy (HCC) 10/29/2012   Diverticulitis    Dyslipidemia    Dyspnea    with exertion   Encephalopathy 06/2016   due to medications after surgery   Fibromyalgia    GERD (gastroesophageal reflux disease)    occas. use of  Prilosec   Heart murmur    sees Dr. Andria, last seen- early 2012   Herniated nucleus pulposus, L2-3 06/25/2016   History of back surgery    Hypertension    02/2010- stress test /w PCP   Hypothyroidism    Loose bowel movements 12/2016   Lumbar radiculopathy 06/27/2016   Lumbar stenosis with neurogenic claudication 03/17/2017   Memory disorder 02/16/2016   Myoclonic jerking    Neuromuscular disorder (HCC)    lumbar radiculopathy, lumbago   Nocturnal leg cramps 09/27/2014   Osteoporosis 03/10/2014   Pneumonia    Sickle cell trait    Sleep apnea    borderline sleep apnea, states she no longer uses, early 2012- stopped using    Spinal stenosis of lumbar region with radiculopathy 02/28/2014   Spondylolisthesis of lumbar region 03/19/2011   Type II or unspecified type diabetes mellitus without mention of complication, uncontrolled 07/08/2013    Surgeries: Procedures: ARTHROPLASTY, KNEE, UNICOMPARTMENTAL on 04/05/2024   Consultants:   Discharged Condition:  Improved  Hospital Course: JOSEPHENE MARRONE is an 83 y.o. female who was admitted 04/05/2024 for operative treatment ofOsteoarthritis of left knee. Patient has severe unremitting pain that affects sleep, daily activities, and work/hobbies. After pre-op clearance the patient was taken to the operating room on 04/05/2024 and underwent  Procedures: ARTHROPLASTY, KNEE, UNICOMPARTMENTAL.    Patient was given perioperative antibiotics:  Anti-infectives (From admission, onward)    Start     Dose/Rate Route Frequency Ordered Stop   04/05/24 1300  ceFAZolin  (ANCEF ) IVPB 2g/100 mL premix        2 g 200 mL/hr over 30 Minutes Intravenous Every 6 hours 04/05/24 1036 04/05/24 2010   04/05/24 0600  ceFAZolin  (ANCEF ) IVPB 2g/100 mL premix        2 g 200 mL/hr over 30 Minutes Intravenous On call to O.R. 04/05/24 9441 04/05/24 9192        Patient was given sequential compression devices, early ambulation, and chemoprophylaxis to prevent DVT.  Patient benefited maximally from hospital stay and there were no complications.    Recent vital signs: Patient Vitals for the past 24 hrs:  BP Temp Temp src Pulse Resp SpO2  04/06/24 1050 132/64 97.9 F (36.6 C) Oral 72 17 94 %     Recent laboratory studies:  Recent Labs    04/06/24 0335  WBC 9.3  HGB 12.1  HCT 36.4  PLT 194  NA  142  K 3.9  CL 105  CO2 26  BUN 18  CREATININE 0.90  GLUCOSE 205*  CALCIUM  9.0     Discharge Medications:   Allergies as of 04/06/2024       Reactions   Cymbalta  [duloxetine  Hcl] Diarrhea, Other (See Comments)   Dizziness, headache, irritability   Amlodipine  Besylate Swelling   Baclofen     jerks    Betadine  [povidone Iodine ] Swelling, Other (See Comments)   SWELLING REACTION DESCRIPTION/SEVERITY UNSPECIFIED  Reaction to betadine  on eye lid and it swelled up   Erythromycin Base Other (See Comments)   unknown   Oxycodone  Hcl Other (See Comments)   confusion   Percocet [oxycodone -acetaminophen ] Other (See  Comments)   Confusion change personality   Povidone-iodine  Swelling   Soap Other (See Comments)   unknown   Statins Other (See Comments)   Muscle cramps   Adhesive [tape] Rash   Lipitor [atorvastatin] Rash        Medication List     TAKE these medications    acetaminophen  500 MG tablet Commonly known as: TYLENOL  Take 1,000 mg by mouth every 8 (eight) hours as needed for mild pain or moderate pain.   amitriptyline  25 MG tablet Commonly known as: ELAVIL  Take 25 mg by mouth at bedtime. What changed:  when to take this reasons to take this   Aspirin  Low Dose 81 MG chewable tablet Generic drug: aspirin  Chew 1 tablet (81 mg total) by mouth 2 (two) times daily for 21 days. Then take one 81 mg aspirin  once a day for three weeks. Then discontinue aspirin .   BD Pen Needle Nano U/F 32G X 4 MM Misc Generic drug: Insulin  Pen Needle Inject insulin  4 times a day   Cal-Mag-Zinc -D Tabs Take 1 tablet by mouth every evening.   Carboxymethylcell-Hypromellose 0.25-0.3 % Gel Place 1-2 drops into both eyes 3 (three) times daily as needed (dry/irritated eyes.).   cetirizine  10 MG tablet Commonly known as: ZYRTEC  Take 1 tablet (10 mg total) by mouth daily as needed for allergies. What changed: when to take this   dapagliflozin  propanediol 5 MG Tabs tablet Commonly known as: Farxiga  Take 1 tablet (5 mg total) by mouth daily.   Dexcom G7 Sensor Misc 1 Device by Does not apply route as directed. Change sensor every 10 days   famotidine  40 MG tablet Commonly known as: PEPCID  Take 40 mg by mouth in the morning.   fluticasone  50 MCG/ACT nasal spray Commonly known as: FLONASE  Place 1 spray into both nostrils daily as needed for allergies.   gabapentin  600 MG tablet Commonly known as: NEURONTIN  TAKE 1 TABLET(600 MG) BY MOUTH THREE TIMES DAILY   HYDROcodone -acetaminophen  5-325 MG tablet Commonly known as: NORCO/VICODIN Take 1 tablet by mouth every 4 (four) hours as needed for  moderate pain (pain score 4-6) or severe pain (pain score 7-10). What changed:  how much to take when to take this reasons to take this   insulin  glargine 100 UNIT/ML Solostar Pen Commonly known as: LANTUS  Inject 8 Units into the skin daily.   insulin  lispro 100 UNIT/ML KwikPen Commonly known as: HumaLOG  KwikPen INJECT 6 UNITS BEFORE BREAKFAST,8-10 UNITS AT LUNCH, 8 UNITS AT DINNER, 3-4 UNITS AS NEEDED FOR LARGE CARBS OR ICE CREAM What changed: additional instructions   irbesartan -hydrochlorothiazide  150-12.5 MG tablet Commonly known as: AVALIDE Take 1 tablet by mouth daily.   levothyroxine  50 MCG tablet Commonly known as: SYNTHROID  TAKE 1 TABLET DAILY   MAGNESIUM  CITRATE PO Take 250  mg by mouth at bedtime.   methocarbamol  500 MG tablet Commonly known as: ROBAXIN  Take 1 tablet (500 mg total) by mouth every 6 (six) hours as needed for muscle spasms.   ondansetron  4 MG tablet Commonly known as: ZOFRAN  Take 1 tablet (4 mg total) by mouth every 6 (six) hours as needed for nausea.   Ozempic  (0.25 or 0.5 MG/DOSE) 2 MG/1.5ML Sopn Generic drug: Semaglutide (0.25 or 0.5MG /DOS) Inject 0.5 mg into the skin once a week. What changed: when to take this   polyethylene glycol powder 17 GM/SCOOP powder Commonly known as: GLYCOLAX /MIRALAX  Take 17 g by mouth as needed. Dissolve 1 capful (17g) in 4-8 ounces of liquid and take by mouth daily.   POTASSIUM GLUCONATE PO Take 650 mg by mouth at bedtime as needed (leg cramping).   RA Magnesium  500 MG Caps Generic drug: Magnesium  Oxide -Mg Supplement Take 500 mg by mouth at bedtime as needed (leg cramps).   Repatha  SureClick 140 MG/ML Soaj Generic drug: Evolocumab  INJECT 1 ML INTO THE SKIN EVERY 14 (FOURTEEN) DAYS.   SOOTHE XP OP Place 1-2 drops into both eyes 3 (three) times daily as needed (dry/irritated eyes.).   Symbicort  80-4.5 MCG/ACT inhaler Generic drug: budesonide -formoterol  Inhale 2 puffs into the lungs 2 (two) times daily  as needed (shortness of breath).   traMADol  50 MG tablet Commonly known as: ULTRAM  Take 1-2 tablets (50-100 mg total) by mouth every 6 (six) hours as needed for moderate pain (pain score 4-6).   TURMERIC PO Take 2,250 mg by mouth at bedtime as needed (inflammation.).               Discharge Care Instructions  (From admission, onward)           Start     Ordered   04/06/24 0000  Weight bearing as tolerated        04/06/24 0848   04/06/24 0000  Change dressing       Comments: You may remove the bulky bandage (ACE wrap and gauze) two days after surgery. You will have an adhesive waterproof bandage underneath. Leave this in place until your first follow-up appointment.   04/06/24 0848            Diagnostic Studies: No results found.  Disposition: Discharge disposition: 01-Home or Self Care       Discharge Instructions     Call MD / Call 911   Complete by: As directed    If you experience chest pain or shortness of breath, CALL 911 and be transported to the hospital emergency room.  If you develope a fever above 101 F, pus (white drainage) or increased drainage or redness at the wound, or calf pain, call your surgeon's office.   Change dressing   Complete by: As directed    You may remove the bulky bandage (ACE wrap and gauze) two days after surgery. You will have an adhesive waterproof bandage underneath. Leave this in place until your first follow-up appointment.   Constipation Prevention   Complete by: As directed    Drink plenty of fluids.  Prune juice may be helpful.  You may use a stool softener, such as Colace (over the counter) 100 mg twice a day.  Use MiraLax  (over the counter) for constipation as needed.   Diet - low sodium heart healthy   Complete by: As directed    Do not put a pillow under the knee. Place it under the heel.   Complete by: As directed  Driving restrictions   Complete by: As directed    No driving for two weeks   Post-operative  opioid taper instructions:   Complete by: As directed    POST-OPERATIVE OPIOID TAPER INSTRUCTIONS: It is important to wean off of your opioid medication as soon as possible. If you do not need pain medication after your surgery it is ok to stop day one. Opioids include: Codeine, Hydrocodone (Norco, Vicodin), Oxycodone (Percocet, oxycontin ) and hydromorphone  amongst others.  Long term and even short term use of opiods can cause: Increased pain response Dependence Constipation Depression Respiratory depression And more.  Withdrawal symptoms can include Flu like symptoms Nausea, vomiting And more Techniques to manage these symptoms Hydrate well Eat regular healthy meals Stay active Use relaxation techniques(deep breathing, meditating, yoga) Do Not substitute Alcohol to help with tapering If you have been on opioids for less than two weeks and do not have pain than it is ok to stop all together.  Plan to wean off of opioids This plan should start within one week post op of your joint replacement. Maintain the same interval or time between taking each dose and first decrease the dose.  Cut the total daily intake of opioids by one tablet each day Next start to increase the time between doses. The last dose that should be eliminated is the evening dose.      TED hose   Complete by: As directed    Use stockings (TED hose) for three weeks on both leg(s).  You may remove them at night for sleeping.   Weight bearing as tolerated   Complete by: As directed         Follow-up Information     Aluisio, Dempsey, MD. Schedule an appointment as soon as possible for a visit in 2 week(s).   Specialty: Orthopedic Surgery Contact information: [address] [place] [address] [phone]                  Signed: Roxie Mess 04/07/2024, 8:23 AM

## 2024-04-29 ENCOUNTER — Ambulatory Visit: Admitting: Podiatry

## 2024-05-08 ENCOUNTER — Other Ambulatory Visit: Payer: Self-pay | Admitting: Endocrinology

## 2024-05-08 DIAGNOSIS — E118 Type 2 diabetes mellitus with unspecified complications: Secondary | ICD-10-CM

## 2024-05-27 ENCOUNTER — Other Ambulatory Visit

## 2024-05-28 ENCOUNTER — Ambulatory Visit: Payer: Self-pay | Admitting: Endocrinology

## 2024-05-28 LAB — BASIC METABOLIC PANEL WITH GFR
BUN: 17 mg/dL (ref 7–25)
CO2: 32 mmol/L (ref 20–32)
Calcium: 9.8 mg/dL (ref 8.6–10.4)
Chloride: 102 mmol/L (ref 98–110)
Creat: 0.91 mg/dL (ref 0.60–0.95)
Glucose, Bld: 118 mg/dL — ABNORMAL HIGH (ref 65–99)
Potassium: 3.6 mmol/L (ref 3.5–5.3)
Sodium: 144 mmol/L (ref 135–146)
eGFR: 62 mL/min/{1.73_m2}

## 2024-05-28 LAB — HEMOGLOBIN A1C
Hgb A1c MFr Bld: 6.2 % — ABNORMAL HIGH
Mean Plasma Glucose: 131 mg/dL
eAG (mmol/L): 7.3 mmol/L

## 2024-05-28 LAB — T4, FREE: Free T4: 1.3 ng/dL (ref 0.8–1.8)

## 2024-05-28 LAB — TSH: TSH: 2.48 m[IU]/L (ref 0.40–4.50)

## 2024-06-01 ENCOUNTER — Ambulatory Visit: Admitting: Endocrinology
